# Patient Record
Sex: Female | Born: 1960
Health system: Southern US, Community
[De-identification: ages and names within clinical notes are randomized; demographics above are authoritative.]

## PROBLEM LIST (undated history)

## (undated) DIAGNOSIS — G5603 Carpal tunnel syndrome, bilateral upper limbs: Secondary | ICD-10-CM

## (undated) DIAGNOSIS — K2921 Alcoholic gastritis with bleeding: Secondary | ICD-10-CM

## (undated) DIAGNOSIS — M542 Cervicalgia: Secondary | ICD-10-CM

## (undated) DIAGNOSIS — I10 Essential (primary) hypertension: Secondary | ICD-10-CM

## (undated) DIAGNOSIS — Z973 Presence of spectacles and contact lenses: Secondary | ICD-10-CM

## (undated) DIAGNOSIS — K219 Gastro-esophageal reflux disease without esophagitis: Secondary | ICD-10-CM

## (undated) DIAGNOSIS — E785 Hyperlipidemia, unspecified: Secondary | ICD-10-CM

## (undated) DIAGNOSIS — Z780 Asymptomatic menopausal state: Secondary | ICD-10-CM

## (undated) DIAGNOSIS — M1990 Unspecified osteoarthritis, unspecified site: Secondary | ICD-10-CM

## (undated) DIAGNOSIS — A045 Campylobacter enteritis: Secondary | ICD-10-CM

## (undated) DIAGNOSIS — I219 Acute myocardial infarction, unspecified: Secondary | ICD-10-CM

## (undated) HISTORY — DX: Alcoholic gastritis with bleeding: K29.21

## (undated) HISTORY — DX: Hyperlipidemia, unspecified: E78.5

## (undated) HISTORY — DX: Unspecified osteoarthritis, unspecified site: M19.90

## (undated) HISTORY — DX: Cervicalgia: M54.2

## (undated) HISTORY — DX: Carpal tunnel syndrome, bilateral upper limbs: G56.03

## (undated) HISTORY — DX: Essential (primary) hypertension: I10

## (undated) HISTORY — DX: Campylobacter enteritis: A04.5

## (undated) HISTORY — PX: TONSILLECTOMY: SUR1361

## (undated) HISTORY — DX: Asymptomatic menopausal state: Z78.0

---

## 1993-11-30 HISTORY — PX: CYST EXCISION: SHX5701

## 1998-03-13 ENCOUNTER — Ambulatory Visit (HOSPITAL_COMMUNITY): Admission: RE | Admit: 1998-03-13 | Discharge: 1998-03-13 | Payer: Self-pay | Admitting: Family Medicine

## 1998-07-11 ENCOUNTER — Ambulatory Visit (HOSPITAL_COMMUNITY): Admission: RE | Admit: 1998-07-11 | Discharge: 1998-07-11 | Payer: Self-pay | Admitting: Specialist

## 1998-07-20 ENCOUNTER — Ambulatory Visit (HOSPITAL_COMMUNITY): Admission: RE | Admit: 1998-07-20 | Discharge: 1998-07-20 | Payer: Self-pay | Admitting: Family Medicine

## 1998-07-25 ENCOUNTER — Ambulatory Visit (HOSPITAL_BASED_OUTPATIENT_CLINIC_OR_DEPARTMENT_OTHER): Admission: RE | Admit: 1998-07-25 | Discharge: 1998-07-25 | Payer: Self-pay | Admitting: Specialist

## 1998-12-27 ENCOUNTER — Encounter: Payer: Self-pay | Admitting: Family Medicine

## 1998-12-27 ENCOUNTER — Ambulatory Visit (HOSPITAL_COMMUNITY): Admission: RE | Admit: 1998-12-27 | Discharge: 1998-12-27 | Payer: Self-pay | Admitting: Family Medicine

## 2000-12-06 ENCOUNTER — Emergency Department (HOSPITAL_COMMUNITY): Admission: EM | Admit: 2000-12-06 | Discharge: 2000-12-07 | Payer: Self-pay | Admitting: Emergency Medicine

## 2001-04-19 ENCOUNTER — Ambulatory Visit (HOSPITAL_BASED_OUTPATIENT_CLINIC_OR_DEPARTMENT_OTHER): Admission: RE | Admit: 2001-04-19 | Discharge: 2001-04-19 | Payer: Self-pay | Admitting: Family Medicine

## 2001-06-17 ENCOUNTER — Emergency Department (HOSPITAL_COMMUNITY): Admission: EM | Admit: 2001-06-17 | Discharge: 2001-06-17 | Payer: Self-pay | Admitting: Emergency Medicine

## 2001-06-28 ENCOUNTER — Encounter: Admission: RE | Admit: 2001-06-28 | Discharge: 2001-06-28 | Payer: Self-pay | Admitting: Internal Medicine

## 2001-07-01 ENCOUNTER — Ambulatory Visit (HOSPITAL_COMMUNITY): Admission: RE | Admit: 2001-07-01 | Discharge: 2001-07-01 | Payer: Self-pay | Admitting: Family Medicine

## 2001-07-04 ENCOUNTER — Encounter: Admission: RE | Admit: 2001-07-04 | Discharge: 2001-10-02 | Payer: Self-pay | Admitting: Internal Medicine

## 2001-08-08 ENCOUNTER — Encounter: Admission: RE | Admit: 2001-08-08 | Discharge: 2001-08-08 | Payer: Self-pay | Admitting: Infectious Diseases

## 2001-08-18 ENCOUNTER — Encounter: Admission: RE | Admit: 2001-08-18 | Discharge: 2001-08-29 | Payer: Self-pay | Admitting: Internal Medicine

## 2002-06-27 ENCOUNTER — Encounter: Admission: RE | Admit: 2002-06-27 | Discharge: 2002-06-27 | Payer: Self-pay | Admitting: Internal Medicine

## 2002-08-11 ENCOUNTER — Encounter: Admission: RE | Admit: 2002-08-11 | Discharge: 2002-08-11 | Payer: Self-pay | Admitting: Internal Medicine

## 2002-11-27 ENCOUNTER — Emergency Department (HOSPITAL_COMMUNITY): Admission: EM | Admit: 2002-11-27 | Discharge: 2002-11-28 | Payer: Self-pay

## 2002-11-28 ENCOUNTER — Encounter: Payer: Self-pay | Admitting: Emergency Medicine

## 2002-11-30 HISTORY — PX: OTHER SURGICAL HISTORY: SHX169

## 2002-12-06 ENCOUNTER — Encounter: Admission: RE | Admit: 2002-12-06 | Discharge: 2002-12-06 | Payer: Self-pay | Admitting: Internal Medicine

## 2003-03-29 ENCOUNTER — Emergency Department (HOSPITAL_COMMUNITY): Admission: EM | Admit: 2003-03-29 | Discharge: 2003-03-29 | Payer: Self-pay

## 2003-03-31 ENCOUNTER — Emergency Department (HOSPITAL_COMMUNITY): Admission: EM | Admit: 2003-03-31 | Discharge: 2003-03-31 | Payer: Self-pay | Admitting: Emergency Medicine

## 2003-04-20 ENCOUNTER — Encounter: Admission: RE | Admit: 2003-04-20 | Discharge: 2003-04-20 | Payer: Self-pay | Admitting: Internal Medicine

## 2003-04-27 ENCOUNTER — Encounter: Payer: Self-pay | Admitting: Internal Medicine

## 2003-04-27 ENCOUNTER — Ambulatory Visit (HOSPITAL_COMMUNITY): Admission: RE | Admit: 2003-04-27 | Discharge: 2003-04-27 | Payer: Self-pay | Admitting: Internal Medicine

## 2003-05-21 ENCOUNTER — Encounter: Admission: RE | Admit: 2003-05-21 | Discharge: 2003-05-21 | Payer: Self-pay | Admitting: Internal Medicine

## 2003-05-22 ENCOUNTER — Encounter: Admission: RE | Admit: 2003-05-22 | Discharge: 2003-05-22 | Payer: Self-pay | Admitting: Internal Medicine

## 2003-05-24 ENCOUNTER — Encounter: Admission: RE | Admit: 2003-05-24 | Discharge: 2003-05-24 | Payer: Self-pay | Admitting: Internal Medicine

## 2003-08-09 ENCOUNTER — Encounter (INDEPENDENT_AMBULATORY_CARE_PROVIDER_SITE_OTHER): Payer: Self-pay | Admitting: Internal Medicine

## 2003-08-09 LAB — CONVERTED CEMR LAB: Pap Smear: NORMAL

## 2003-08-10 ENCOUNTER — Encounter: Admission: RE | Admit: 2003-08-10 | Discharge: 2003-08-10 | Payer: Self-pay | Admitting: Internal Medicine

## 2003-08-23 ENCOUNTER — Encounter: Admission: RE | Admit: 2003-08-23 | Discharge: 2003-08-23 | Payer: Self-pay | Admitting: Obstetrics and Gynecology

## 2003-08-23 ENCOUNTER — Encounter (INDEPENDENT_AMBULATORY_CARE_PROVIDER_SITE_OTHER): Payer: Self-pay | Admitting: *Deleted

## 2003-08-23 ENCOUNTER — Other Ambulatory Visit: Admission: RE | Admit: 2003-08-23 | Discharge: 2003-08-23 | Payer: Self-pay | Admitting: Obstetrics and Gynecology

## 2003-08-29 ENCOUNTER — Ambulatory Visit (HOSPITAL_COMMUNITY): Admission: RE | Admit: 2003-08-29 | Discharge: 2003-08-29 | Payer: Self-pay | Admitting: Obstetrics and Gynecology

## 2003-08-29 ENCOUNTER — Encounter: Payer: Self-pay | Admitting: Obstetrics and Gynecology

## 2004-02-20 ENCOUNTER — Encounter: Admission: RE | Admit: 2004-02-20 | Discharge: 2004-02-20 | Payer: Self-pay | Admitting: Internal Medicine

## 2004-03-04 ENCOUNTER — Encounter: Admission: RE | Admit: 2004-03-04 | Discharge: 2004-03-04 | Payer: Self-pay | Admitting: Internal Medicine

## 2004-03-05 ENCOUNTER — Encounter: Admission: RE | Admit: 2004-03-05 | Discharge: 2004-03-05 | Payer: Self-pay | Admitting: Internal Medicine

## 2004-04-03 ENCOUNTER — Encounter: Admission: RE | Admit: 2004-04-03 | Discharge: 2004-04-03 | Payer: Self-pay | Admitting: Internal Medicine

## 2004-07-14 ENCOUNTER — Encounter: Admission: RE | Admit: 2004-07-14 | Discharge: 2004-07-14 | Payer: Self-pay | Admitting: Internal Medicine

## 2004-08-25 ENCOUNTER — Ambulatory Visit: Payer: Self-pay | Admitting: Internal Medicine

## 2004-11-10 ENCOUNTER — Ambulatory Visit: Payer: Self-pay | Admitting: Internal Medicine

## 2004-11-20 ENCOUNTER — Ambulatory Visit (HOSPITAL_COMMUNITY): Admission: RE | Admit: 2004-11-20 | Discharge: 2004-11-20 | Payer: Self-pay | Admitting: Internal Medicine

## 2005-03-23 ENCOUNTER — Ambulatory Visit: Payer: Self-pay | Admitting: Internal Medicine

## 2005-04-29 ENCOUNTER — Ambulatory Visit: Payer: Self-pay | Admitting: Internal Medicine

## 2005-06-01 ENCOUNTER — Ambulatory Visit: Payer: Self-pay | Admitting: Internal Medicine

## 2005-06-08 ENCOUNTER — Ambulatory Visit: Payer: Self-pay | Admitting: Internal Medicine

## 2005-06-08 ENCOUNTER — Inpatient Hospital Stay (HOSPITAL_COMMUNITY): Admission: AD | Admit: 2005-06-08 | Discharge: 2005-06-10 | Payer: Self-pay | Admitting: Internal Medicine

## 2005-06-18 ENCOUNTER — Ambulatory Visit: Payer: Self-pay | Admitting: Internal Medicine

## 2005-09-02 ENCOUNTER — Ambulatory Visit (HOSPITAL_COMMUNITY): Admission: RE | Admit: 2005-09-02 | Discharge: 2005-09-02 | Payer: Self-pay | Admitting: Internal Medicine

## 2005-09-29 ENCOUNTER — Ambulatory Visit: Payer: Self-pay | Admitting: Internal Medicine

## 2005-10-06 ENCOUNTER — Ambulatory Visit: Payer: Self-pay | Admitting: Internal Medicine

## 2005-12-23 ENCOUNTER — Ambulatory Visit: Payer: Self-pay | Admitting: Internal Medicine

## 2006-02-23 ENCOUNTER — Ambulatory Visit: Payer: Self-pay | Admitting: Internal Medicine

## 2006-05-11 ENCOUNTER — Ambulatory Visit: Payer: Self-pay | Admitting: Internal Medicine

## 2006-05-11 ENCOUNTER — Ambulatory Visit (HOSPITAL_COMMUNITY): Admission: RE | Admit: 2006-05-11 | Discharge: 2006-05-11 | Payer: Self-pay | Admitting: Internal Medicine

## 2006-09-03 ENCOUNTER — Ambulatory Visit (HOSPITAL_COMMUNITY): Admission: RE | Admit: 2006-09-03 | Discharge: 2006-09-03 | Payer: Self-pay | Admitting: Internal Medicine

## 2006-09-06 ENCOUNTER — Ambulatory Visit (HOSPITAL_COMMUNITY): Admission: RE | Admit: 2006-09-06 | Discharge: 2006-09-06 | Payer: Self-pay | Admitting: Internal Medicine

## 2006-09-06 ENCOUNTER — Ambulatory Visit: Payer: Self-pay | Admitting: *Deleted

## 2006-09-06 ENCOUNTER — Ambulatory Visit: Payer: Self-pay | Admitting: Internal Medicine

## 2006-11-15 ENCOUNTER — Ambulatory Visit: Payer: Self-pay | Admitting: Internal Medicine

## 2006-11-15 ENCOUNTER — Ambulatory Visit (HOSPITAL_COMMUNITY): Admission: RE | Admit: 2006-11-15 | Discharge: 2006-11-15 | Payer: Self-pay | Admitting: Internal Medicine

## 2006-11-16 ENCOUNTER — Encounter (INDEPENDENT_AMBULATORY_CARE_PROVIDER_SITE_OTHER): Payer: Self-pay | Admitting: Internal Medicine

## 2006-11-16 DIAGNOSIS — E785 Hyperlipidemia, unspecified: Secondary | ICD-10-CM | POA: Insufficient documentation

## 2006-11-16 DIAGNOSIS — F1010 Alcohol abuse, uncomplicated: Secondary | ICD-10-CM | POA: Insufficient documentation

## 2006-11-16 DIAGNOSIS — D259 Leiomyoma of uterus, unspecified: Secondary | ICD-10-CM | POA: Insufficient documentation

## 2006-11-16 DIAGNOSIS — N61 Mastitis without abscess: Secondary | ICD-10-CM

## 2006-11-16 DIAGNOSIS — I10 Essential (primary) hypertension: Secondary | ICD-10-CM | POA: Insufficient documentation

## 2006-11-16 DIAGNOSIS — E1169 Type 2 diabetes mellitus with other specified complication: Secondary | ICD-10-CM

## 2006-11-17 DIAGNOSIS — M545 Low back pain, unspecified: Secondary | ICD-10-CM | POA: Insufficient documentation

## 2006-11-17 DIAGNOSIS — K219 Gastro-esophageal reflux disease without esophagitis: Secondary | ICD-10-CM | POA: Insufficient documentation

## 2006-11-17 DIAGNOSIS — N840 Polyp of corpus uteri: Secondary | ICD-10-CM | POA: Insufficient documentation

## 2006-12-06 ENCOUNTER — Ambulatory Visit: Payer: Self-pay | Admitting: Internal Medicine

## 2007-03-07 ENCOUNTER — Ambulatory Visit: Payer: Self-pay | Admitting: Internal Medicine

## 2007-03-07 LAB — CONVERTED CEMR LAB
Cholesterol: 154 mg/dL (ref 0–200)
HDL: 42 mg/dL (ref >39)
Total CHOL/HDL Ratio: 3.7
Triglycerides: 76 mg/dL (ref <150)
VLDL: 15 mg/dL (ref 0–40)

## 2007-03-09 ENCOUNTER — Ambulatory Visit: Payer: Self-pay | Admitting: Internal Medicine

## 2007-04-11 ENCOUNTER — Ambulatory Visit: Payer: Self-pay | Admitting: Internal Medicine

## 2007-04-11 DIAGNOSIS — L732 Hidradenitis suppurativa: Secondary | ICD-10-CM

## 2007-04-11 HISTORY — DX: Hidradenitis suppurativa: L73.2

## 2007-04-11 LAB — CONVERTED CEMR LAB: Blood Glucose, Fingerstick: 207

## 2007-04-29 ENCOUNTER — Emergency Department (HOSPITAL_COMMUNITY): Admission: EM | Admit: 2007-04-29 | Discharge: 2007-04-29 | Payer: Self-pay | Admitting: Emergency Medicine

## 2007-05-19 ENCOUNTER — Encounter (INDEPENDENT_AMBULATORY_CARE_PROVIDER_SITE_OTHER): Payer: Self-pay | Admitting: Internal Medicine

## 2007-06-07 ENCOUNTER — Telehealth: Payer: Self-pay | Admitting: *Deleted

## 2007-07-07 ENCOUNTER — Ambulatory Visit: Payer: Self-pay | Admitting: Internal Medicine

## 2007-10-04 ENCOUNTER — Ambulatory Visit: Payer: Self-pay | Admitting: Internal Medicine

## 2007-12-26 ENCOUNTER — Telehealth (INDEPENDENT_AMBULATORY_CARE_PROVIDER_SITE_OTHER): Payer: Self-pay | Admitting: *Deleted

## 2008-05-08 ENCOUNTER — Emergency Department (HOSPITAL_COMMUNITY): Admission: EM | Admit: 2008-05-08 | Discharge: 2008-05-08 | Payer: Self-pay | Admitting: Emergency Medicine

## 2008-08-07 ENCOUNTER — Ambulatory Visit: Payer: Self-pay | Admitting: Internal Medicine

## 2008-11-21 ENCOUNTER — Ambulatory Visit: Payer: Self-pay | Admitting: Internal Medicine

## 2008-11-30 DIAGNOSIS — K2921 Alcoholic gastritis with bleeding: Secondary | ICD-10-CM

## 2008-11-30 HISTORY — DX: Alcoholic gastritis with bleeding: K29.21

## 2008-11-30 HISTORY — PX: FOOT OSTEOTOMY: SHX957

## 2009-01-17 LAB — HM PAP SMEAR: HM Pap smear: NORMAL

## 2009-06-25 ENCOUNTER — Inpatient Hospital Stay: Payer: Self-pay | Admitting: Internal Medicine

## 2010-04-30 LAB — HM COLONOSCOPY: HM Colonoscopy: NORMAL

## 2010-05-07 ENCOUNTER — Ambulatory Visit: Payer: Self-pay | Admitting: Gastroenterology

## 2010-07-30 ENCOUNTER — Ambulatory Visit: Payer: Self-pay | Admitting: Internal Medicine

## 2010-07-31 LAB — HM MAMMOGRAPHY: HM Mammogram: NORMAL

## 2010-08-12 ENCOUNTER — Ambulatory Visit: Payer: Self-pay | Admitting: Internal Medicine

## 2010-12-31 ENCOUNTER — Ambulatory Visit: Payer: Self-pay | Admitting: Internal Medicine

## 2011-03-31 ENCOUNTER — Inpatient Hospital Stay: Payer: Self-pay | Admitting: Internal Medicine

## 2011-03-31 DIAGNOSIS — A045 Campylobacter enteritis: Secondary | ICD-10-CM

## 2011-03-31 HISTORY — DX: Campylobacter enteritis: A04.5

## 2011-04-15 ENCOUNTER — Ambulatory Visit: Payer: Self-pay | Admitting: Internal Medicine

## 2011-04-17 NOTE — Discharge Summary (Signed)
NAMEJAINA, [name]                ACCOUNT NO.:  [id number]   MEDICAL RECORD NO.:  [id number]          PATIENT TYPE:  INP   LOCATION:  6708                         FACILITY:  [facility]   PHYSICIAN:  Alvester [name], M.D.  DATE OF BIRTH:  02-15-1961   DATE OF ADMISSION:  06/08/2005  DATE OF DISCHARGE:  06/10/2005                                 DISCHARGE SUMMARY   DISCHARGE DIAGNOSES:  1.  Subcutaneous abscess and cellulitis on right inner thigh.  2.  Type 2 diabetes mellitus.  3.  Hypertension.  4.  Dyslipidemia.  5.  Dyspepsia.  6.  History of uterine fibroids.   DISCHARGE MEDICATIONS:  1.  Cephalexin 500 mg, two capsules b.i.d. for 14 days.  2.  Enalapril 5 mg, one p.o. daily.  3.  Glipizide 10 mg, one p.o. b.i.d.  4.  Glucophage 1000 mg, one p.o. b.i.d.  5.  HCTZ 25 mg, one p.o. daily.  6.  Protonix 40 mg, one p.o. daily.  7.  Lipitor 10 mg, one p.o. daily.   PROCEDURES:  No procedures were performed while the patient was admitted,  although an I&D of her right thigh abscess was performed in clinic on the  day of admission.  Please see clinic notes for details of that procedure.   CONSULTATIONS:  None.   ADMISSION HISTORY AND PHYSICAL:  This was an 50 year old African American  female who was admitted directly from clinic because of bilateral thigh  abscesses.  The right inner thigh abscess was remarkable for erythema, and  the clinic physician was concerned about possible spread and blood  contamination due to the patient's uncontrolled diabetes.  The patient had  no symptoms of infection.  The abscesses were not painful to her, but she  did note that they had been there for approximately three weeks.  She does  have a history of similar boils in her inguinal area as well as under her  arms which she had to have surgery for in 1999.  The patient states that  when she gets these boils they usually resolve with warm compresses only and  has never had to get them I&D'd  before.  She has had no fevers or chills.  She has had some mild nausea and vomiting over the past two to three weeks,  with about two events of vomiting and two events of diarrhea.   In clinic on the day of discharge, the right inner thigh abscess was I&D'd,  with expression of pus and some blood.  Wound cultures x2 were sent to the  lab, and the patient was admitted for IV antibiotics.   PHYSICAL EXAMINATION:  VITAL SIGNS:  Pulse 94, blood pressure 120/84,  temperature 97.1, respirations 20, oxygen saturation 99% on room air.  Blood  glucose 207.  GENERAL:  The patient was in no acute distress, alert and oriented x3, in no  acute pain.  SKIN:  Exam was remarkable on the right inner thigh for a 4 x 3 nodular area  with induration and fluctuance and erythema extending to approximately 20 cm  along the  medial thigh.  There was gauze and a bandage over the I&D site  that had a small amount of blood and some pus on hit.  On the left inner  thigh, there were some smaller pustules but no surrounding erythema, about 3-  5 in number, and they were not draining.  LYMPHATICS:  There was no inguinal lymphadenopathy and no lymphadenopathy in  the axilla.  The exam was otherwise unremarkable.   ADMISSION LABORATORY DATA:  Sodium 135, potassium 3.5, chloride 102, bicarb  24, BUN 10, creatinine 0.6, blood glucose 252.  White blood cells 8.6,  hemoglobin 12.6, platelets 342, with an ANC of 5.4.   HOSPITAL COURSE:  Problem 1.  Right thigh abscess and cellulitis.  As  mentioned previously, the abscess was I&D'd in clinic, with expression of  some pus, but there was concern for spread of the infection due to the large  area of erythema and the patient's very poorly controlled diabetes.  The  patient's clinic physician, Dr. Darrick Huntsman, decided that IV antibiotics would be  helpful.  We admitted the patient and began her on vancomycin to cover  possible MRSA and also Zosyn to cover gram-negative organisms as  well as  anaerobes.  Wound cultures x2 were sent from the clinic, and blood cultures  were taken prior to antibiotics when the patient was admitted.  Over the  course of the patient's admission, the abscess area had a remarkable  decrease in the surrounding erythema and cellulitis as well as a decrease in  the hard nodular area that was I&D'd.  The patient had no fever and no  increase in white blood cells while she was admitted and no other signs or  symptoms of systemic infection.  The wound cultures eventually grew group B  strep, and the blood cultures were no growth at the time of this dictation.  Since group B strep was the only organism isolated from the wound, it was  decided to discontinue IV antibiotics and just to discharge the patient home  on cephalexin 500 mg, two capsules p.o. b.i.d. for 14 days.   Problem 2.  Diabetes mellitus, type 2.  The patient's blood sugars were  poorly controlled prior to admission.  The patient states that she had lost  her glucometer and could not check her blood sugars at home, but she could  tell that her blood sugars were high because of vaginal itching, changes in  her vision, and some headache.  Throughout the course of her stay, her blood  sugars were in the upper 100s to lower 200s, with an isolated sugar of 361.  She was continued during her admission on her home medications of glipizide  and Glucophage since she had questionable compliance prior to admission.  She was also covered by a sliding scale insulin with NovoLog.  She also  received diabetic counseling.  Her hemoglobin A1C during the admission was  11.  Upon discharge, she was again started on her home medications of  glipizide and Glucophage, even though her blood sugars were not well-  controlled during her admission.  She had questionable compliance prior to  coming into the hospital, so we would like to try her on her home regimen and see how her blood glucose responds.  The  patient was asked if she would  like to go on a nighttime dose of Lantus insulin, but the patient was not  interested in self-administering insulin injections.  The patient expressed  understanding of the  need for her to be an active participant in controlling  her diabetes and expressed awareness of the possible complications of  uncontrolled diabetes.  The patient seemed appropriately concerned about her  diabetes and expressed her desires to become compliant with her diabetes  regimen.  She was given a coupon for a free glucometer, so she should pick  this up upon discharge and be able to check her blood sugars regularly.   DISCHARGE LABORATORY DATA:  Sodium 134, potassium 3.5, chloride 102, bicarb  23, BUN 7, creatinine 0.5, glucose 247.  White blood cells 7.1, hemoglobin  11.9, platelets 331.  Liver function tests were all within normal limits.   FOLLOW UP:  The patient will go to see Dr. Darrick Huntsman in the [facility] clinic on  06/18/2005 at 2:15.       EA/MEDQ  D:  06/11/2005  T:  06/11/2005  Job:  [id number]   cc:   Duncan Dull, M.D.  Fax: [phone]

## 2011-04-17 NOTE — Group Therapy Note (Signed)
   NAME:  DULA, HAVLIK NO.:  [id number]   MEDICAL RECORD NO.:  [id number]                   PATIENT TYPE:  OUT   LOCATION:  [facility]                           FACILITY:  [facility]   PHYSICIAN:  Argentina Donovan, MD                     DATE OF BIRTH:  1961-05-13   DATE OF SERVICE:  08/23/2003                                    CLINIC NOTE   REASON FOR VISIT:  The patient is a 50 year old black female who has been on  Depo-Provera for many years.  She is a poorly controlled diabetic mellitus  patient who is being seen by the medical clinic and is on oral medications.  She is also taking Lipitor for dyslipidemia.  She comes in the GYN clinic  after being sent by the medical clinic because of complaints of having had  uterine spotting and bleeding for two months.  Also, she complains of some  significant itching around the clitoral area.   PHYSICAL EXAMINATION:  PELVIC:  On examination, external genitalia is  normal.  BUS within normal limits.  The vagina is clean with a dark burgundy  discharge from the cervix, stringy in nature.  Cervix is clean and parous.  The uterus seems to be a normal size, shape, and consistency and sounds to 7  cm.  Endometrial biopsy has been taken.  The clitoral area is examined,  seems to be dry, pale pink with small paper cuts probably secondary to  yeast.  The patient is also complaining of recurrent furuncles on the  perineum and vulva area.   PLAN:  My plan is to give her Mycolog-II to use around the clitoral area,  then to wait for the results of the endometrial biopsy and to do a pelvic  sonogram with our future treatment pending depending on those results, and  to put her on long-term, at least six months, of doxycycline b.i.d. in a  hope to resolve and to reduce the incidence of furunculosis.                                               Argentina Donovan, MD    PR/MEDQ  D:  08/23/2003  T:  08/23/2003  Job:  [id number]

## 2011-08-01 ENCOUNTER — Other Ambulatory Visit: Payer: Self-pay | Admitting: Internal Medicine

## 2011-08-04 ENCOUNTER — Encounter: Payer: Self-pay | Admitting: Internal Medicine

## 2011-08-04 ENCOUNTER — Ambulatory Visit (INDEPENDENT_AMBULATORY_CARE_PROVIDER_SITE_OTHER): Payer: BC Managed Care – PPO | Admitting: Internal Medicine

## 2011-08-04 DIAGNOSIS — Z1239 Encounter for other screening for malignant neoplasm of breast: Secondary | ICD-10-CM | POA: Insufficient documentation

## 2011-08-04 DIAGNOSIS — M5431 Sciatica, right side: Secondary | ICD-10-CM

## 2011-08-04 DIAGNOSIS — E1165 Type 2 diabetes mellitus with hyperglycemia: Secondary | ICD-10-CM

## 2011-08-04 DIAGNOSIS — Z1211 Encounter for screening for malignant neoplasm of colon: Secondary | ICD-10-CM

## 2011-08-04 DIAGNOSIS — K219 Gastro-esophageal reflux disease without esophagitis: Secondary | ICD-10-CM

## 2011-08-04 DIAGNOSIS — M545 Low back pain: Secondary | ICD-10-CM

## 2011-08-04 DIAGNOSIS — E785 Hyperlipidemia, unspecified: Secondary | ICD-10-CM

## 2011-08-04 DIAGNOSIS — M5430 Sciatica, unspecified side: Secondary | ICD-10-CM

## 2011-08-04 DIAGNOSIS — E119 Type 2 diabetes mellitus without complications: Secondary | ICD-10-CM

## 2011-08-04 MED ORDER — HYDROCODONE-ACETAMINOPHEN 7.5-500 MG PO TABS: 1.0000 | ORAL_TABLET | Freq: Every evening | ORAL | Status: AC | PRN

## 2011-08-04 MED ORDER — INSULIN GLARGINE 100 UNIT/ML ~~LOC~~ SOLN: 30.0000 [IU] | Freq: Every day | SUBCUTANEOUS | Status: DC

## 2011-08-04 MED ORDER — GLIPIZIDE 10 MG PO TABS: 10.0000 mg | ORAL_TABLET | Freq: Two times a day (BID) | ORAL | Status: DC

## 2011-08-04 MED ORDER — ESOMEPRAZOLE MAGNESIUM 40 MG PO CPDR: 40.0000 mg | DELAYED_RELEASE_CAPSULE | Freq: Every day | ORAL | Status: DC

## 2011-08-04 MED ORDER — METFORMIN HCL 1000 MG PO TABS: 1000.0000 mg | ORAL_TABLET | Freq: Two times a day (BID) | ORAL | Status: DC

## 2011-08-04 MED ORDER — TRAMADOL HCL 50 MG PO TABS: 50.0000 mg | ORAL_TABLET | Freq: Four times a day (QID) | ORAL | Status: AC | PRN

## 2011-08-04 NOTE — Assessment & Plan Note (Addendum)
With prior episodes of gastritis due to alcohol use  .  currently asymptomatic on Nexium.  Samples and coupon given.

## 2011-08-04 NOTE — Assessment & Plan Note (Addendum)
Last Aic was 7.01 March 2011  But she was started on 70/30 insulin 20 units with dinner for elevated cbgs in may despiite use of  Januvia, metformin and glipizide. UA was normal  No proteinuria.  She has allowed all of her mediations to lapse for unclear reasons and is requesting saples of Januvia, which we do not have.  Will start Lantus Solostar with 30 day free voucher at 30 units daily , resuem glipizde and metformin.

## 2011-08-04 NOTE — Progress Notes (Signed)
  Subjective:    Patient ID: [name], female    DOB: 25-Dec-1960, 50 y.o.   MRN: [id number]  HPIpresents for diabetes followup.  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  Uses a pillow under knees at night helps.    Past Medical History  Diagnosis Date  . Hyperlipidemia   . Diabetes mellitus   . Gastritis with bleeding due to alcohol 2010    [facility] admission  . Campylobacter enteritis May 2012    [facility] admission   Current Outpatient Prescriptions on File Prior to Visit  Medication Sig Dispense Refill  . insulin glargine (LANTUS SOLOSTAR) 100 UNIT/ML injection Inject 30 Units into the skin at bedtime.  10 mL  12     Review of Systems  Constitutional: Negative for fever, chills and unexpected weight change.  HENT: Negative for hearing loss, ear pain, nosebleeds, congestion, sore throat, facial swelling, rhinorrhea, sneezing, mouth sores, trouble swallowing, neck pain, neck stiffness, voice change, postnasal drip, sinus pressure, tinnitus and ear discharge.   Eyes: Negative for pain, discharge, redness and visual disturbance.  Respiratory: Negative for cough, chest tightness, shortness of breath, wheezing and stridor.   Cardiovascular: Negative for chest pain, palpitations and leg swelling.  Musculoskeletal: Positive for back pain. Negative for myalgias and arthralgias.  Skin: Negative for color change and rash.  Neurological: Negative for dizziness, weakness, light-headedness and headaches.  Hematological: Negative for adenopathy.   BP 126/85  Pulse 85  Temp(Src) 98.2 F (36.8 C) (Oral)  Resp 16  Ht 5\' 3"  (1.6 m)  Wt 179 lb 4 oz (81.307 kg)  BMI 31.75 kg/m2  SpO2 99%     Objective:   Physical Exam  Constitutional: She is oriented to person, place, and time. She appears well-developed and well-nourished.    HENT:  Mouth/Throat: Oropharynx is clear and moist.  Eyes: EOM are normal. Pupils are equal, round, and reactive to light. No scleral icterus.  Neck: Normal range of motion. Neck supple. No JVD present. No thyromegaly present.  Cardiovascular: Normal rate, regular rhythm, normal heart sounds and intact distal pulses.   Pulmonary/Chest: Effort normal and breath sounds normal.  Abdominal: Soft. Bowel sounds are normal. She exhibits no mass. There is no tenderness.  Musculoskeletal: Normal range of motion. She exhibits no edema.  Lymphadenopathy:    She has no cervical adenopathy.  Neurological: She is alert and oriented to person, place, and time. She has normal reflexes.  Skin: Skin is warm and dry.  Psychiatric: She has a normal mood and affect.          Assessment & Plan:

## 2011-08-04 NOTE — Patient Instructions (Addendum)
Check with your insurance booklet about the price of Januvia and Lantus .  (the one with the lower Tier is going to be less expensive)  We will be setting you up for lumbar imaging or nerve conduction studies with a neurologist to determine the source of your leg pain .  Return next week for fasting lipids.   Sing a medical release form today so I can get your old records.

## 2011-08-05 NOTE — Assessment & Plan Note (Signed)
She is due for mammogram. 

## 2011-08-05 NOTE — Assessment & Plan Note (Signed)
Has been out of statin for a week,.  Will resume and check labs in 2 weeks,  Goal is < 70.

## 2011-08-05 NOTE — Assessment & Plan Note (Signed)
With sciatica described by patient.  Will review prior workup and proceed with MRI vs EMG/Ashley studies to determine cause.

## 2011-08-10 ENCOUNTER — Other Ambulatory Visit: Payer: Self-pay | Admitting: Internal Medicine

## 2011-08-10 DIAGNOSIS — E785 Hyperlipidemia, unspecified: Secondary | ICD-10-CM

## 2011-08-11 ENCOUNTER — Other Ambulatory Visit (INDEPENDENT_AMBULATORY_CARE_PROVIDER_SITE_OTHER): Payer: BC Managed Care – PPO | Admitting: *Deleted

## 2011-08-11 DIAGNOSIS — E119 Type 2 diabetes mellitus without complications: Secondary | ICD-10-CM

## 2011-08-11 DIAGNOSIS — E785 Hyperlipidemia, unspecified: Secondary | ICD-10-CM

## 2011-08-11 LAB — HEMOGLOBIN A1C: Hgb A1c MFr Bld: 9.5 % — ABNORMAL HIGH (ref 4.6–6.5)

## 2011-08-12 LAB — HEPATIC FUNCTION PANEL
ALT: 19 U/L (ref 0–35)
Total Bilirubin: 0.5 mg/dL (ref 0.3–1.2)
Total Protein: 7.3 g/dL (ref 6.0–8.3)

## 2011-08-12 LAB — BASIC METABOLIC PANEL
CO2: 19 mEq/L (ref 19–32)
Calcium: 9.1 mg/dL (ref 8.4–10.5)
Creatinine, Ser: 0.7 mg/dL (ref 0.4–1.2)
GFR: 119.07 mL/min (ref >60.00)
Glucose, Bld: 180 mg/dL — ABNORMAL HIGH (ref 70–99)
Sodium: 140 mEq/L (ref 135–145)

## 2011-08-12 LAB — LIPID PANEL
HDL: 40 mg/dL (ref >39.00)
Total CHOL/HDL Ratio: 4

## 2011-08-14 ENCOUNTER — Encounter: Payer: Self-pay | Admitting: Internal Medicine

## 2011-08-18 ENCOUNTER — Telehealth: Payer: Self-pay | Admitting: Internal Medicine

## 2011-08-18 NOTE — Telephone Encounter (Signed)
Patient called and stated she has had a knot under her breast for the past two weeks.  She thought at first if was coming from her bra but her stated it is still there and will not go away.  She wanted to know if she needed to be seen for this.  Please advise

## 2011-08-19 ENCOUNTER — Encounter: Payer: Self-pay | Admitting: Internal Medicine

## 2011-08-19 MED ORDER — SULFAMETHOXAZOLE-TRIMETHOPRIM 800-160 MG PO TABS: 1.0000 | ORAL_TABLET | Freq: Two times a day (BID) | ORAL | Status: AC

## 2011-08-19 NOTE — Telephone Encounter (Signed)
Rx sent to pharmacy. Patient notified. 

## 2011-08-19 NOTE — Telephone Encounter (Signed)
Transferred to [name] to schedule appt.

## 2011-08-19 NOTE — Telephone Encounter (Signed)
Yes, she should be seen,  Put her in for 1:30 on Thursday and call her  in Septra DS one tablet bid x 7 days  #14 IF she says it is red and tender

## 2011-08-20 ENCOUNTER — Ambulatory Visit (INDEPENDENT_AMBULATORY_CARE_PROVIDER_SITE_OTHER): Payer: BC Managed Care – PPO | Admitting: Internal Medicine

## 2011-08-20 ENCOUNTER — Encounter: Payer: Self-pay | Admitting: Internal Medicine

## 2011-08-20 VITALS — BP 111/74 | HR 64 | Temp 97.6°F | Resp 14 | Wt 180.0 lb

## 2011-08-20 DIAGNOSIS — L02229 Furuncle of trunk, unspecified: Secondary | ICD-10-CM

## 2011-08-20 DIAGNOSIS — M25579 Pain in unspecified ankle and joints of unspecified foot: Secondary | ICD-10-CM

## 2011-08-20 DIAGNOSIS — R110 Nausea: Secondary | ICD-10-CM | POA: Insufficient documentation

## 2011-08-20 DIAGNOSIS — L732 Hidradenitis suppurativa: Secondary | ICD-10-CM

## 2011-08-20 DIAGNOSIS — E119 Type 2 diabetes mellitus without complications: Secondary | ICD-10-CM

## 2011-08-20 NOTE — Assessment & Plan Note (Signed)
She has a history of alcohol induced gastritis but has been abstinent from alcohol and NSAIDs and contineus to use protonix daily,  So her symptoms may be from the metformin.  Instructed her to stop it for a week .  If pain resolved,  Will resume metformin to rule out coincidence .

## 2011-08-20 NOTE — Assessment & Plan Note (Addendum)
improved with resuming Lantus.  Will dc januvia , dc metformin temporarily to see if nausea resolves.  Stop glipizide if hypoglycemia occurs  The dramatic improvement .  Will repeat A1c in early December.

## 2011-08-20 NOTE — Patient Instructions (Addendum)
Take the Septra for 7 days for the skin infection.  Stop the metformin for a week to see if the nausea improves.  If it does, resume the metformin to be sure it was not a coincidence.  Your blood sugars are much better since you resumed the lantus.    If you start having low blood sugars ( < 80) stop the glipizide first.

## 2011-08-20 NOTE — Assessment & Plan Note (Signed)
Septra was prescribed yesterday by phone for presumed falre. The nodule under her breast does have the appearance of an early boil.  If no improvement after empiric Septra, return.

## 2011-08-20 NOTE — Progress Notes (Signed)
  Subjective:    Patient ID: [name], female    DOB: [birth year], 50 y.o.   MRN: [id number]  HPI 74 yon AA feamle with history of diabetes mellitus,  Recurrent skin infections scheduled urgently for evaluation of red tender nodule under left breast. Patient states that it has been present for at least a week and is improving in size. Occurred at bra line. Once in the room she has mutliple other cojmplaints including frequent hot flashes, recurrent nausea, and right leg pain which seems to start in the ankle and radiate upward to knee and hip .  Her last menstrual period was very light and occurred last week, but she takes DepoProvera shots.  Her blood sugars have improved since her last visit when we resumed Lantus . Her fastings have been generally < 120 and her post prandials  Have been 90 to 130.  She has not been able to resume [place] secondary to cost but continues to take glipizide and metformin with no recent lows.    Review of Systems  Constitutional: Negative for fever, chills and unexpected weight change.  HENT: Negative for hearing loss, ear pain, nosebleeds, congestion, sore throat, facial swelling, rhinorrhea, sneezing, mouth sores, trouble swallowing, neck pain, neck stiffness, voice change, postnasal drip, sinus pressure, tinnitus and ear discharge.   Eyes: Negative for pain, discharge, redness and visual disturbance.  Respiratory: Negative for cough, chest tightness, shortness of breath, wheezing and stridor.   Cardiovascular: Negative for chest pain, palpitations and leg swelling.  Musculoskeletal: Positive for back pain. Negative for myalgias and arthralgias.  Skin: Negative for color change and rash.  Neurological: Negative for dizziness, weakness, light-headedness and headaches.  Hematological: Negative for adenopathy.       Objective:   Physical Exam  Constitutional: She is oriented to person, place, and time. She appears well-developed and well-nourished.  HENT:    Mouth/Throat: Oropharynx is clear and moist.  Eyes: EOM are normal. Pupils are equal, round, and reactive to light. No scleral icterus.  Neck: Normal range of motion. Neck supple. No JVD present. No thyromegaly present.  Cardiovascular: Normal rate, regular rhythm, normal heart sounds and intact distal pulses.   Pulmonary/Chest: Effort normal and breath sounds normal.  Abdominal: Soft. Bowel sounds are normal. She exhibits no shifting dullness, no abdominal bruit and no mass. There is no tenderness.  Musculoskeletal: Normal range of motion. She exhibits no edema.  Lymphadenopathy:    She has no cervical adenopathy.  Neurological: She is alert and oriented to person, place, and time. She has normal reflexes.  Skin: Skin is warm and dry. Rash noted.     Psychiatric: She has a normal mood and affect.          Assessment & Plan:

## 2011-08-20 NOTE — Assessment & Plan Note (Signed)
She has chronic pain complaints and priro films of lumbar spine and knees have been normal.  Her source of pain appears to be the base of the achilles tendon. Will refer back to Podiatry to evaluate her ankle pain as this may be source of her leg pain.

## 2011-10-16 ENCOUNTER — Other Ambulatory Visit: Payer: Self-pay | Admitting: Internal Medicine

## 2011-10-16 DIAGNOSIS — M5430 Sciatica, unspecified side: Secondary | ICD-10-CM

## 2011-10-18 NOTE — Telephone Encounter (Signed)
30 day supply authorized ,  No refills

## 2011-11-03 ENCOUNTER — Encounter: Payer: Self-pay | Admitting: Internal Medicine

## 2011-11-03 ENCOUNTER — Ambulatory Visit (INDEPENDENT_AMBULATORY_CARE_PROVIDER_SITE_OTHER): Payer: BC Managed Care – PPO | Admitting: Internal Medicine

## 2011-11-03 DIAGNOSIS — M129 Arthropathy, unspecified: Secondary | ICD-10-CM

## 2011-11-03 DIAGNOSIS — M130 Polyarthritis, unspecified: Secondary | ICD-10-CM

## 2011-11-03 DIAGNOSIS — E785 Hyperlipidemia, unspecified: Secondary | ICD-10-CM

## 2011-11-03 DIAGNOSIS — E1165 Type 2 diabetes mellitus with hyperglycemia: Secondary | ICD-10-CM

## 2011-11-03 DIAGNOSIS — E119 Type 2 diabetes mellitus without complications: Secondary | ICD-10-CM

## 2011-11-03 DIAGNOSIS — Z23 Encounter for immunization: Secondary | ICD-10-CM

## 2011-11-03 DIAGNOSIS — G8929 Other chronic pain: Secondary | ICD-10-CM

## 2011-11-03 MED ORDER — INSULIN NPH (HUMAN) (ISOPHANE) 100 UNIT/ML ~~LOC~~ SUSP: SUBCUTANEOUS | Status: DC

## 2011-11-03 NOTE — Progress Notes (Signed)
Subjective:    Patient ID: [name], female    DOB: [birth year], 50 y.o.   MRN: [id number]  HPI 50 yo AA female with history of DM  here for followup on uncontrolled DM, a1c was 9.5 in September 2011.  Started taking Lantus after last visit,  She is not exercising but is avoid excessive starches. Using  36 units daily.  Did not bring her blood sugar log with her .  Recalls that her most recent high was 237, which was a morning post prandial.  Evening bs have been lower, 80 to 120, post prandial.  Has not checked it in 3 days.  She appears less concerned about her diabetes due to persistent pain. Reports right ankle pain with no prior trauma.  Past Medical History  Diagnosis Date  . Hyperlipidemia   . Diabetes mellitus   . Gastritis with bleeding due to alcohol 2010    [facility] admission  . Campylobacter enteritis May 2012    [facility] admission  . Hypertension   . Arthritis     Current Outpatient Prescriptions on File Prior to Visit  Medication Sig Dispense Refill  . esomeprazole (NEXIUM) 40 MG capsule Take 1 capsule (40 mg total) by mouth daily.  30 capsule  1  . glipiZIDE (GLUCOTROL) 10 MG tablet Take 1 tablet (10 mg total) by mouth 2 (two) times daily before a meal.  60 tablet  11  . HYDROcodone-acetaminophen (LORTAB) 7.5-500 MG per tablet TAKE ONE TABLET BY MOUTH AT BEDTIME AS NEEDED FOR PAIN  30 tablet  0  . insulin glargine (LANTUS SOLOSTAR) 100 UNIT/ML injection Inject 30 Units into the skin at bedtime.  10 mL  12  . medroxyPROGESTERone (DEPO-PROVERA) 150 MG/ML injection Inject 150 mg into the muscle every 3 (three) months.        . metFORMIN (GLUCOPHAGE) 1000 MG tablet Take 1 tablet (1,000 mg total) by mouth 2 (two) times daily with a meal.  60 tablet  11  . simvastatin (ZOCOR) 40 MG tablet Take 40 mg by mouth at bedtime.        . traMADol (ULTRAM) 50 MG tablet Take 1 tablet (50 mg total) by mouth every 6 (six) hours as needed for pain.  120 tablet  1    Review of Systems    Constitutional: Negative for fever, chills and unexpected weight change.  HENT: Negative for hearing loss, ear pain, nosebleeds, congestion, sore throat, facial swelling, rhinorrhea, sneezing, mouth sores, trouble swallowing, neck pain, neck stiffness, voice change, postnasal drip, sinus pressure, tinnitus and ear discharge.   Eyes: Negative for pain, discharge, redness and visual disturbance.  Respiratory: Negative for cough, chest tightness, shortness of breath, wheezing and stridor.   Cardiovascular: Negative for chest pain, palpitations and leg swelling.  Musculoskeletal: Positive for arthralgias. Negative for myalgias.  Skin: Negative for color change and rash.  Neurological: Negative for dizziness, weakness, light-headedness and headaches.  Hematological: Negative for adenopathy.       Objective:   Physical Exam  Constitutional: She is oriented to person, place, and time. She appears well-developed and well-nourished.  HENT:  Mouth/Throat: Oropharynx is clear and moist.  Eyes: EOM are normal. Pupils are equal, round, and reactive to light. No scleral icterus.  Neck: Normal range of motion. Neck supple. No JVD present. No thyromegaly present.  Cardiovascular: Normal rate, regular rhythm, normal heart sounds and intact distal pulses.   Pulmonary/Chest: Effort normal and breath sounds normal.  Abdominal: Soft. Bowel sounds are normal. She  exhibits no mass. There is no tenderness.  Musculoskeletal: Normal range of motion. She exhibits no edema.  Lymphadenopathy:    She has no cervical adenopathy.  Neurological: She is alert and oriented to person, place, and time.  Skin: Skin is warm and dry.  Psychiatric: She has a normal mood and affect.       Assessment & Plan:   DIABETES MELLITUS, TYPE II She has minimally improved control with addition of Lantus.  Changing to NPH to help loer fasting glucose levels,  Will start with 15 units and increase by 3 units every 3 days.    Polyarthritis of ankle and foot Checking serologies for inflammatory etiologies, referral to Rheumatology planned.     Updated Medication List Outpatient Encounter Prescriptions as of 11/03/2011  Medication Sig Dispense Refill  . esomeprazole (NEXIUM) 40 MG capsule Take 1 capsule (40 mg total) by mouth daily.  30 capsule  1  . glipiZIDE (GLUCOTROL) 10 MG tablet Take 1 tablet (10 mg total) by mouth 2 (two) times daily before a meal.  60 tablet  11  . HYDROcodone-acetaminophen (LORTAB) 7.5-500 MG per tablet TAKE ONE TABLET BY MOUTH AT BEDTIME AS NEEDED FOR PAIN  30 tablet  0  . insulin glargine (LANTUS SOLOSTAR) 100 UNIT/ML injection Inject 30 Units into the skin at bedtime.  10 mL  12  . medroxyPROGESTERone (DEPO-PROVERA) 150 MG/ML injection Inject 150 mg into the muscle every 3 (three) months.        . metFORMIN (GLUCOPHAGE) 1000 MG tablet Take 1 tablet (1,000 mg total) by mouth 2 (two) times daily with a meal.  60 tablet  11  . simvastatin (ZOCOR) 40 MG tablet Take 40 mg by mouth at bedtime.        . traMADol (ULTRAM) 50 MG tablet Take 1 tablet (50 mg total) by mouth every 6 (six) hours as needed for pain.  120 tablet  1  . insulin NPH (NOVOLIN N) 100 UNIT/ML injection Inject 15 units at bedtime  15 mL  3  . DISCONTD: HYDROcodone-acetaminophen (VICODIN) 5-500 MG per tablet Take 1 tablet by mouth 2 (two) times daily as needed.        Marland Kitchen DISCONTD: sitaGLIPtin (JANUVIA) 100 MG tablet Take 100 mg by mouth daily.

## 2011-11-03 NOTE — Patient Instructions (Signed)
We are changing your insulin from Lantus to NPH,  whic hwill help brring your morning sugars dow to gaol.    Start with 10 units at bedtime. Increase by 3 units every 3 days until your morning fasting blood sugar is < 150.    We will referring you to Dr. Kathi Ludwig, a rheumatologist for evaluation of your joint pain if your labs are abnormal..   return in one week for labs

## 2011-11-04 ENCOUNTER — Encounter: Payer: Self-pay | Admitting: Internal Medicine

## 2011-11-04 DIAGNOSIS — M130 Polyarthritis, unspecified: Secondary | ICD-10-CM | POA: Insufficient documentation

## 2011-11-04 DIAGNOSIS — M1990 Unspecified osteoarthritis, unspecified site: Secondary | ICD-10-CM | POA: Insufficient documentation

## 2011-11-04 NOTE — Assessment & Plan Note (Signed)
Checking serologies for inflammatory etiologies, referral to Rheumatology planned.

## 2011-11-04 NOTE — Assessment & Plan Note (Signed)
She has minimally improved control with addition of Lantus.  Changing to NPH to help loer fasting glucose levels,  Will start with 15 units and increase by 3 units every 3 days.

## 2011-11-20 ENCOUNTER — Other Ambulatory Visit (INDEPENDENT_AMBULATORY_CARE_PROVIDER_SITE_OTHER): Payer: BC Managed Care – PPO | Admitting: *Deleted

## 2011-11-20 DIAGNOSIS — E119 Type 2 diabetes mellitus without complications: Secondary | ICD-10-CM

## 2011-11-20 DIAGNOSIS — IMO0001 Reserved for inherently not codable concepts without codable children: Secondary | ICD-10-CM

## 2011-11-20 DIAGNOSIS — M129 Arthropathy, unspecified: Secondary | ICD-10-CM

## 2011-11-20 DIAGNOSIS — E1165 Type 2 diabetes mellitus with hyperglycemia: Secondary | ICD-10-CM

## 2011-11-20 DIAGNOSIS — E785 Hyperlipidemia, unspecified: Secondary | ICD-10-CM

## 2011-11-20 DIAGNOSIS — G8929 Other chronic pain: Secondary | ICD-10-CM

## 2011-11-20 LAB — HEMOGLOBIN A1C: Hgb A1c MFr Bld: 8.4 % — ABNORMAL HIGH (ref 4.6–6.5)

## 2011-11-20 LAB — SEDIMENTATION RATE: Sed Rate: 19 mm/hr (ref 0–22)

## 2011-11-20 LAB — BASIC METABOLIC PANEL
BUN: 12 mg/dL (ref 6–23)
GFR: 135.1 mL/min (ref >60.00)
Potassium: 3.5 mEq/L (ref 3.5–5.1)
Sodium: 141 mEq/L (ref 135–145)

## 2011-11-20 LAB — MICROALBUMIN / CREATININE URINE RATIO
Creatinine,U: 15.5 mg/dL
Microalb, Ur: 0.4 mg/dL (ref 0.0–1.9)

## 2011-11-21 LAB — DRUG SCREEN, URINE
Amphetamine Screen, Ur: NEGATIVE
Benzodiazepines.: NEGATIVE
Cocaine Metabolites: NEGATIVE
Methadone: NEGATIVE

## 2011-12-18 LAB — HM MAMMOGRAPHY

## 2011-12-22 ENCOUNTER — Telehealth: Payer: Self-pay | Admitting: Internal Medicine

## 2011-12-22 MED ORDER — SULFAMETHOXAZOLE-TRIMETHOPRIM 800-160 MG PO TABS: ORAL_TABLET | ORAL | Status: DC

## 2011-12-22 NOTE — Telephone Encounter (Signed)
Patient has a big boil on her left leg and her left thumb hurts and sometimes she has cramps in it.

## 2011-12-22 NOTE — Telephone Encounter (Signed)
Advised patient, appt made for next week, medicine called to pharmacy.

## 2011-12-22 NOTE — Telephone Encounter (Signed)
Please call her in Septra DS one tablet twice daily  For 7 days  #14 for the boil.  Giver her an appt either by the end of the week or after I come back from my conference about the thumb.

## 2011-12-31 ENCOUNTER — Encounter: Payer: Self-pay | Admitting: Internal Medicine

## 2011-12-31 ENCOUNTER — Ambulatory Visit (INDEPENDENT_AMBULATORY_CARE_PROVIDER_SITE_OTHER): Payer: BC Managed Care – PPO | Admitting: Internal Medicine

## 2011-12-31 DIAGNOSIS — M659 Synovitis and tenosynovitis, unspecified: Secondary | ICD-10-CM

## 2011-12-31 DIAGNOSIS — M65849 Other synovitis and tenosynovitis, unspecified hand: Secondary | ICD-10-CM

## 2011-12-31 DIAGNOSIS — M654 Radial styloid tenosynovitis [de Quervain]: Secondary | ICD-10-CM

## 2011-12-31 MED ORDER — CELECOXIB 200 MG PO CAPS: 200.0000 mg | ORAL_CAPSULE | Freq: Every day | ORAL | Status: AC

## 2011-12-31 NOTE — Progress Notes (Signed)
Subjective:    Patient ID: [name], female    DOB: 07/29/1961, 51 y.o.   MRN: [id number]  HPI  [name] is a 51 year old African American female with history of diabetes and hypertension who presents with a two-month history of left thumb pain which radiates  to shoulder.  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 She states that the pain has has become progressively worse over the last 2 months it is now hurting constantly. When she uses to some the pain will shoot up her arm to her shoulder. She has avoided nonsteroidals given her history of gastritis  With a GI bleed in the last 2 years.however her gastritis was due to alcohol abuse. She is having trouble sleeping due to the pain.   Past Medical History  Diagnosis Date  . Hyperlipidemia   . Diabetes mellitus   . Gastritis with bleeding due to alcohol 2010    [facility] admission  . Campylobacter enteritis May 2012    [facility] admission  . Hypertension   . Arthritis    Current Outpatient Prescriptions on File Prior to Visit  Medication Sig Dispense Refill  . esomeprazole (NEXIUM) 40 MG capsule Take 1 capsule (40 mg total) by mouth daily.  30 capsule  1  . glipiZIDE (GLUCOTROL) 10 MG tablet Take 1 tablet (10 mg total) by mouth 2 (two) times daily before a meal.  60 tablet  11  . HYDROcodone-acetaminophen (LORTAB) 7.5-500 MG per tablet TAKE ONE TABLET BY MOUTH AT BEDTIME AS NEEDED FOR PAIN  30 tablet  0  . insulin NPH (NOVOLIN N) 100 UNIT/ML injection Inject 15 units at bedtime  15 mL  3  . medroxyPROGESTERone (DEPO-PROVERA) 150 MG/ML injection Inject 150 mg into the muscle every 3 (three) months.        . metFORMIN (GLUCOPHAGE) 1000 MG tablet Take 1 tablet (1,000 mg total) by mouth 2 (two) times daily with a meal.  60 tablet  11  . simvastatin (ZOCOR) 40 MG tablet Take 40 mg by mouth at bedtime.        . traMADol (ULTRAM) 50 MG tablet Take 1  tablet (50 mg total) by mouth every 6 (six) hours as needed for pain.  120 tablet  1    Review of Systems  Constitutional: Negative for fever, chills and unexpected weight change.  HENT: Negative for hearing loss, ear pain, nosebleeds, congestion, sore throat, facial swelling, rhinorrhea, sneezing, mouth sores, trouble swallowing, neck pain, neck stiffness, voice change, postnasal drip, sinus pressure, tinnitus and ear discharge.   Eyes: Negative for pain, discharge, redness and visual disturbance.  Respiratory: Negative for cough, chest tightness, shortness of breath, wheezing and stridor.   Cardiovascular: Negative for chest pain, palpitations and leg swelling.  Musculoskeletal: Positive for arthralgias. Negative for myalgias.  Skin: Negative for color change and rash.  Neurological: Negative for dizziness, weakness, light-headedness and headaches.  Hematological: Negative for adenopathy.       Objective:   Physical Exam  Constitutional: She is oriented to person, place, and time. She appears well-developed and well-nourished.  HENT:  Mouth/Throat: Oropharynx is clear and moist.  Eyes: EOM are normal. Pupils are equal, round, and reactive to light. No scleral icterus.  Neck: Normal range of motion. Neck supple. No JVD present. No thyromegaly present.  Cardiovascular: Normal rate, regular rhythm, normal heart sounds and intact distal pulses.  Pulmonary/Chest: Effort normal and breath sounds normal.  Abdominal: Soft. Bowel sounds are normal. She exhibits no mass. There is no tenderness.  Musculoskeletal: Normal range of motion. She exhibits no edema.  Lymphadenopathy:    She has no cervical adenopathy.  Neurological: She is alert and oriented to person, place, and time.  Skin: Skin is warm and dry.  Psychiatric: She has a normal mood and affect.      Assessment & Plan: Tenosynovitis of thumb Her exam is relatively normal but by description and history she has tenosynovitis  possibly de Quervain's. I have ordered a thumb splint for her to wear for the next 2 weeks 24 7 and did have given her samples of Celebrex 200 mg daily which she can combine with her tramadol or Vicodin. If her pain has not improved in 2 weeks she'll need referral to orthopedics.    Updated Medication List Outpatient Encounter Prescriptions as of 12/31/2011  Medication Sig Dispense Refill  . esomeprazole (NEXIUM) 40 MG capsule Take 1 capsule (40 mg total) by mouth daily.  30 capsule  1  . glipiZIDE (GLUCOTROL) 10 MG tablet Take 1 tablet (10 mg total) by mouth 2 (two) times daily before a meal.  60 tablet  11  . HYDROcodone-acetaminophen (LORTAB) 7.5-500 MG per tablet TAKE ONE TABLET BY MOUTH AT BEDTIME AS NEEDED FOR PAIN  30 tablet  0  . insulin glargine (LANTUS) 100 UNIT/ML injection Inject 28 Units into the skin at bedtime.      . insulin NPH (NOVOLIN N) 100 UNIT/ML injection Inject 15 units at bedtime  15 mL  3  . medroxyPROGESTERone (DEPO-PROVERA) 150 MG/ML injection Inject 150 mg into the muscle every 3 (three) months.        . metFORMIN (GLUCOPHAGE) 1000 MG tablet Take 1 tablet (1,000 mg total) by mouth 2 (two) times daily with a meal.  60 tablet  11  . simvastatin (ZOCOR) 40 MG tablet Take 40 mg by mouth at bedtime.        . traMADol (ULTRAM) 50 MG tablet Take 1 tablet (50 mg total) by mouth every 6 (six) hours as needed for pain.  120 tablet  1  . celecoxib (CELEBREX) 200 MG capsule Take 1 capsule (200 mg total) by mouth daily.  15 capsule  0  . DISCONTD: insulin glargine (LANTUS SOLOSTAR) 100 UNIT/ML injection Inject 30 Units into the skin at bedtime.  10 mL  12  . DISCONTD: sulfamethoxazole-trimethoprim (BACTRIM DS) 800-160 MG per tablet Take one tablet twice a day times 7 days.  14 tablet  0      Tenosynovitis of thumb Her exam is relatively normal but by description and history she has tenosynovitis possibly de Quervain's. I have ordered a thumb splint for her to wear for the next 2  weeks 24 7 and did have given her samples of Celebrex 200 mg daily which she can combine with her tramadol or Vicodin. If her pain has not improved in 2 weeks she'll need referral to orthopedics.    Updated Medication List Outpatient Encounter Prescriptions as of 12/31/2011  Medication Sig Dispense Refill  . esomeprazole (NEXIUM) 40 MG capsule Take 1 capsule (40 mg total) by mouth daily.  30 capsule  1  . glipiZIDE (GLUCOTROL) 10 MG tablet Take 1 tablet (10 mg total) by mouth 2 (two) times daily before a meal.  60 tablet  11  . HYDROcodone-acetaminophen (LORTAB) 7.5-500 MG per tablet TAKE ONE TABLET BY MOUTH AT BEDTIME AS  NEEDED FOR PAIN  30 tablet  0  . insulin glargine (LANTUS) 100 UNIT/ML injection Inject 28 Units into the skin at bedtime.      . insulin NPH (NOVOLIN N) 100 UNIT/ML injection Inject 15 units at bedtime  15 mL  3  . medroxyPROGESTERone (DEPO-PROVERA) 150 MG/ML injection Inject 150 mg into the muscle every 3 (three) months.        . metFORMIN (GLUCOPHAGE) 1000 MG tablet Take 1 tablet (1,000 mg total) by mouth 2 (two) times daily with a meal.  60 tablet  11  . simvastatin (ZOCOR) 40 MG tablet Take 40 mg by mouth at bedtime.        . traMADol (ULTRAM) 50 MG tablet Take 1 tablet (50 mg total) by mouth every 6 (six) hours as needed for pain.  120 tablet  1  . celecoxib (CELEBREX) 200 MG capsule Take 1 capsule (200 mg total) by mouth daily.  15 capsule  0  . DISCONTD: insulin glargine (LANTUS SOLOSTAR) 100 UNIT/ML injection Inject 30 Units into the skin at bedtime.  10 mL  12  . DISCONTD: sulfamethoxazole-trimethoprim (BACTRIM DS) 800-160 MG per tablet Take one tablet twice a day times 7 days.  14 tablet  0

## 2011-12-31 NOTE — Patient Instructions (Signed)
I am prescribing celebrex 20 mg daily and a padded thumb splint for you to wear 24/7 for the next two weeks.  If you have not seen an improvement in 2 weeks,  Call for a referral to an orthopedist.

## 2012-01-02 ENCOUNTER — Encounter: Payer: Self-pay | Admitting: Internal Medicine

## 2012-01-02 DIAGNOSIS — M659 Synovitis and tenosynovitis, unspecified: Secondary | ICD-10-CM | POA: Insufficient documentation

## 2012-01-02 NOTE — Assessment & Plan Note (Signed)
Her exam is relatively normal but by description and history she has tenosynovitis possibly de Quervain's. I have ordered a thumb splint for her to wear for the next 2 weeks 24 7 and did have given her samples of Celebrex 200 mg daily which she can combine with her tramadol or Vicodin. If her pain has not improved in 2 weeks she'll need referral to orthopedics.

## 2012-01-18 ENCOUNTER — Ambulatory Visit: Payer: Self-pay | Admitting: Internal Medicine

## 2012-01-18 LAB — HM MAMMOGRAPHY: HM Mammogram: ABNORMAL

## 2012-01-19 ENCOUNTER — Other Ambulatory Visit: Payer: Self-pay | Admitting: Internal Medicine

## 2012-01-19 DIAGNOSIS — N63 Unspecified lump in unspecified breast: Secondary | ICD-10-CM

## 2012-01-21 ENCOUNTER — Telehealth: Payer: Self-pay | Admitting: *Deleted

## 2012-01-21 DIAGNOSIS — N63 Unspecified lump in unspecified breast: Secondary | ICD-10-CM

## 2012-01-21 NOTE — Telephone Encounter (Signed)
Kristie at [facility] called regarding pt's mammogram and ultrasound report.  Radiologist felt that results were negative but did suggest a surgical evaluation if you felt it was clinically needed.  They just wanted to bring this to your attention and Danford Bad has asked to be notified if pt is referred to surgeon.     Order for mammogram was faxed to [facility] this morning.

## 2012-01-21 NOTE — Telephone Encounter (Signed)
Dr. Darrick Huntsman, you marked on mammogram report that you are going to refer pt to surgeon.  Please advise.

## 2012-01-21 NOTE — Telephone Encounter (Signed)
Mammogram order needed

## 2012-01-21 NOTE — Telephone Encounter (Signed)
Since i have not examined the patient , this was a self described breast mass, I would need her to come in for an exam before I decide on referring.

## 2012-01-22 NOTE — Telephone Encounter (Signed)
Yes after reading the report I would like to rlefer her to Dr Lemar Livings  o r Dr Evette Cristal for evaluation

## 2012-01-25 NOTE — Telephone Encounter (Signed)
Patient is seeing dr. Birdie Sons on 02/02/12 at 9:30.

## 2012-01-26 ENCOUNTER — Telehealth: Payer: Self-pay | Admitting: Internal Medicine

## 2012-01-26 NOTE — Telephone Encounter (Signed)
Does not meet criteria for handicapped sticker,.  2) referral to [facility]se was ordered days ago,.  Has shannon contacted patient yet?

## 2012-01-26 NOTE — Telephone Encounter (Signed)
The bump on her left breast has gotten bigger and her breast is tender and dark. She also needs a handicap sticker. Please give her a call.

## 2012-01-29 NOTE — Telephone Encounter (Signed)
Patient has an appt with Dr. Lemar Livings on 02/02/12 at 9:30.  I gave patient his number and address.

## 2012-02-01 ENCOUNTER — Ambulatory Visit (INDEPENDENT_AMBULATORY_CARE_PROVIDER_SITE_OTHER): Payer: BC Managed Care – PPO | Admitting: Internal Medicine

## 2012-02-01 ENCOUNTER — Encounter: Payer: Self-pay | Admitting: Internal Medicine

## 2012-02-01 DIAGNOSIS — E119 Type 2 diabetes mellitus without complications: Secondary | ICD-10-CM

## 2012-02-01 DIAGNOSIS — M5430 Sciatica, unspecified side: Secondary | ICD-10-CM

## 2012-02-01 DIAGNOSIS — N63 Unspecified lump in unspecified breast: Secondary | ICD-10-CM | POA: Insufficient documentation

## 2012-02-01 MED ORDER — SULFAMETHOXAZOLE-TRIMETHOPRIM 800-160 MG PO TABS: 1.0000 | ORAL_TABLET | Freq: Two times a day (BID) | ORAL | Status: DC

## 2012-02-01 MED ORDER — HYDROCODONE-ACETAMINOPHEN 7.5-500 MG PO TABS: 1.0000 | ORAL_TABLET | Freq: Four times a day (QID) | ORAL | Status: DC | PRN

## 2012-02-01 MED ORDER — INSULIN NPH (HUMAN) (ISOPHANE) 100 UNIT/ML ~~LOC~~ SUSP: 45.0000 [IU] | Freq: Every day | SUBCUTANEOUS | Status: DC

## 2012-02-01 NOTE — Patient Instructions (Signed)
Add 20 units of insulin in the morning;  Continue 45 units units at night  Start the Septra antibiotic tonight for your breast infection

## 2012-02-01 NOTE — Assessment & Plan Note (Signed)
Uncontrolled due to current breast infection.  Adding NPH 20 units in the morning

## 2012-02-01 NOTE — Progress Notes (Signed)
  Subjective:    Patient ID: [name], female    DOB: 01-08-1961, 51 y.o.   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 Seen last month and treated last month for deQurevain tenosynovitis of thumb with rx for wrist splint, which she just started wearing one week ago.  Sugars have been elevated for the past week to nearly 200 despite use of NPH insulin 45 units.   Past Medical History  Diagnosis Date  . Hyperlipidemia   . Diabetes mellitus   . Gastritis with bleeding due to alcohol 2010    [facility] admission  . Campylobacter enteritis May 2012    [facility] admission  . Hypertension   . Arthritis    Current Outpatient Prescriptions on File Prior to Visit  Medication Sig Dispense Refill  . esomeprazole (NEXIUM) 40 MG capsule Take 1 capsule (40 mg total) by mouth daily.  30 capsule  1  . glipiZIDE (GLUCOTROL) 10 MG tablet Take 1 tablet (10 mg total) by mouth 2 (two) times daily before a meal.  60 tablet  11  . medroxyPROGESTERone (DEPO-PROVERA) 150 MG/ML injection Inject 150 mg into the muscle every 3 (three) months.        . metFORMIN (GLUCOPHAGE) 1000 MG tablet Take 1 tablet (1,000 mg total) by mouth 2 (two) times daily with a meal.  60 tablet  11  . simvastatin (ZOCOR) 40 MG tablet Take 40 mg by mouth at bedtime.        . traMADol (ULTRAM) 50 MG tablet Take 1 tablet (50 mg total) by mouth every 6 (six) hours as needed for pain.  120 tablet  1     Review of Systems  Constitutional: Negative for fever, chills and unexpected weight change.  HENT: Negative for hearing loss, ear pain, nosebleeds, congestion, sore throat, facial swelling, rhinorrhea, sneezing, mouth sores, trouble swallowing, neck pain, neck stiffness, voice change, postnasal drip, sinus pressure, tinnitus and ear discharge.     Eyes: Negative for pain, discharge, redness and visual disturbance.  Respiratory: Negative for cough, chest tightness, shortness of breath, wheezing and stridor.   Cardiovascular: Negative for chest pain, palpitations and leg swelling.  Musculoskeletal: Positive for arthralgias. Negative for myalgias.  Skin: Negative for color change and rash.  Neurological: Negative for dizziness, weakness, light-headedness and headaches.  Hematological: Negative for adenopathy.  Psychiatric/Behavioral: The patient is hyperactive.        Objective:   Physical Exam  Pulmonary/Chest:            Assessment & Plan:

## 2012-02-01 NOTE — Assessment & Plan Note (Signed)
Nonmalignat,  Due to probable abscess.  Has appt with Dr. Lemar Livings tomorrow for incision and drainage .  Starting Septra bid.

## 2012-02-02 ENCOUNTER — Telehealth: Payer: Self-pay | Admitting: Internal Medicine

## 2012-02-02 NOTE — Telephone Encounter (Signed)
I have the placard ready for Dr. Darrick Huntsman to sign.  I will notify patient when ready for pickup.

## 2012-02-02 NOTE — Telephone Encounter (Signed)
Pt would like to come by today to get a handicapp paperwork.

## 2012-02-05 ENCOUNTER — Encounter: Payer: Self-pay | Admitting: Internal Medicine

## 2012-02-12 ENCOUNTER — Other Ambulatory Visit: Payer: Self-pay | Admitting: Internal Medicine

## 2012-02-14 NOTE — Telephone Encounter (Signed)
Patient will need to come in for a UDS prior to any more refills.

## 2012-02-15 ENCOUNTER — Telehealth: Payer: Self-pay | Admitting: Internal Medicine

## 2012-02-15 DIAGNOSIS — M79646 Pain in unspecified finger(s): Secondary | ICD-10-CM

## 2012-02-15 NOTE — Telephone Encounter (Signed)
Referral to Dr. Charma Igo

## 2012-02-15 NOTE — Telephone Encounter (Signed)
Patient is coming in tomorrow for UDS.

## 2012-02-15 NOTE — Telephone Encounter (Signed)
Patient called and wanted to know if you could refer her to a hand specialist for her thumb.  She stated she has been wearing the splint for a few weeks and it is still painful.   She stated she talked to you about this at her last appt.

## 2012-02-16 ENCOUNTER — Other Ambulatory Visit (INDEPENDENT_AMBULATORY_CARE_PROVIDER_SITE_OTHER): Payer: BC Managed Care – PPO | Admitting: *Deleted

## 2012-02-16 DIAGNOSIS — G8929 Other chronic pain: Secondary | ICD-10-CM

## 2012-02-16 NOTE — Telephone Encounter (Signed)
Patient notified

## 2012-02-17 LAB — DRUG SCREEN, URINE
Creatinine,U: 44.83 mg/dL
Methadone: NEGATIVE
Opiates: NEGATIVE
Phencyclidine (PCP): NEGATIVE
Propoxyphene: NEGATIVE

## 2012-02-24 ENCOUNTER — Telehealth: Payer: Self-pay | Admitting: Internal Medicine

## 2012-02-24 DIAGNOSIS — N611 Abscess of the breast and nipple: Secondary | ICD-10-CM

## 2012-02-24 NOTE — Telephone Encounter (Signed)
[phone] Pt called  Pt was here 2 weeks ago about breast with cyst on breast. Pt stated that her right under arm draining a little and it has oder  Can you call her something in for this.   [facility]  [address]

## 2012-02-25 MED ORDER — SULFAMETHOXAZOLE-TRIMETHOPRIM 800-160 MG PO TABS: 1.0000 | ORAL_TABLET | Freq: Two times a day (BID) | ORAL | Status: AC

## 2012-02-25 NOTE — Telephone Encounter (Signed)
I called patient she stated there is a small bump under her arm not on her breast, and she thinks it is draining because after she washes under her arms she still has an odor.  She has not seen any fluid come out of the bump, she just stated it makes under her arm smell bad.  She denies fever.  She wanted to know if you could call in an antibiotic for this.  Please advise.

## 2012-02-25 NOTE — Telephone Encounter (Signed)
Patient notified of Rx.  

## 2012-02-25 NOTE — Telephone Encounter (Signed)
7 day course of Septra sent to [facility]  For patient to take.

## 2012-04-11 ENCOUNTER — Other Ambulatory Visit: Payer: Self-pay | Admitting: Internal Medicine

## 2012-05-27 ENCOUNTER — Encounter: Payer: Self-pay | Admitting: Internal Medicine

## 2012-05-27 ENCOUNTER — Ambulatory Visit (INDEPENDENT_AMBULATORY_CARE_PROVIDER_SITE_OTHER): Payer: BC Managed Care – PPO | Admitting: Internal Medicine

## 2012-05-27 VITALS — BP 118/70 | HR 94 | Temp 98.3°F | Resp 16 | Wt 174.8 lb

## 2012-05-27 DIAGNOSIS — E1165 Type 2 diabetes mellitus with hyperglycemia: Secondary | ICD-10-CM

## 2012-05-27 DIAGNOSIS — M25579 Pain in unspecified ankle and joints of unspecified foot: Secondary | ICD-10-CM

## 2012-05-27 DIAGNOSIS — M25559 Pain in unspecified hip: Secondary | ICD-10-CM

## 2012-05-27 MED ORDER — FLUCONAZOLE 150 MG PO TABS: 150.0000 mg | ORAL_TABLET | Freq: Every day | ORAL | Status: AC

## 2012-05-27 NOTE — Patient Instructions (Signed)
continue the NPH until you hear from me.  .  Do not start the humalog mix until you hear from me

## 2012-05-27 NOTE — Progress Notes (Signed)
Patient ID: [name], female   DOB: 04/03/1961, 51 y.o.   MRN: [id number] Follow up on diabetes.  Has been taking NPH insulin twice  daily   25 units in the am and   And 45 in the evening but has not titrated her dose as directed at her last visit and her blood sugars remain elevated, in the 200s .  No lows.  Diet is full of starches.  Her niece has offered to help her follow a low carb diet if given a list of foods she can eat.  Second issue is chronic joint pain .  Right ankle continues to be stiff and painful with weight bearing and at time she has pain i her right hip  As well.  No history of fall or of trauma.  Prior serologic workup for inflammatory arthritis was negative. Frequent requests for vicodin

## 2012-05-28 LAB — COMPLETE METABOLIC PANEL WITH GFR
ALT: 17 U/L (ref 0–35)
AST: 13 U/L (ref 0–37)
Alkaline Phosphatase: 103 U/L (ref 39–117)
Calcium: 9.5 mg/dL (ref 8.4–10.5)
Chloride: 103 mEq/L (ref 96–112)
Creat: 0.7 mg/dL (ref 0.50–1.10)
Total Bilirubin: 0.4 mg/dL (ref 0.3–1.2)

## 2012-06-01 ENCOUNTER — Ambulatory Visit (HOSPITAL_COMMUNITY)
Admission: RE | Admit: 2012-06-01 | Discharge: 2012-06-01 | Disposition: A | Discharge status: Home / Self Care | Payer: BC Managed Care – PPO | Source: Ambulatory Visit | Attending: Internal Medicine | Admitting: Internal Medicine

## 2012-06-01 DIAGNOSIS — M25559 Pain in unspecified hip: Secondary | ICD-10-CM | POA: Insufficient documentation

## 2012-06-01 DIAGNOSIS — M25579 Pain in unspecified ankle and joints of unspecified foot: Secondary | ICD-10-CM | POA: Insufficient documentation

## 2012-06-01 DIAGNOSIS — M7730 Calcaneal spur, unspecified foot: Secondary | ICD-10-CM | POA: Insufficient documentation

## 2012-06-06 ENCOUNTER — Telehealth: Payer: Self-pay | Admitting: Internal Medicine

## 2012-06-06 ENCOUNTER — Emergency Department (HOSPITAL_COMMUNITY)
Admission: EM | Admit: 2012-06-06 | Discharge: 2012-06-06 | Disposition: A | Discharge status: Home / Self Care | Payer: BC Managed Care – PPO | Attending: Emergency Medicine | Admitting: Emergency Medicine

## 2012-06-06 DIAGNOSIS — Y92009 Unspecified place in unspecified non-institutional (private) residence as the place of occurrence of the external cause: Secondary | ICD-10-CM | POA: Insufficient documentation

## 2012-06-06 DIAGNOSIS — Z794 Long term (current) use of insulin: Secondary | ICD-10-CM | POA: Insufficient documentation

## 2012-06-06 DIAGNOSIS — E119 Type 2 diabetes mellitus without complications: Secondary | ICD-10-CM | POA: Insufficient documentation

## 2012-06-06 DIAGNOSIS — X131XXA Other contact with steam and other hot vapors, initial encounter: Secondary | ICD-10-CM | POA: Insufficient documentation

## 2012-06-06 DIAGNOSIS — X12XXXA Contact with other hot fluids, initial encounter: Secondary | ICD-10-CM | POA: Insufficient documentation

## 2012-06-06 DIAGNOSIS — I10 Essential (primary) hypertension: Secondary | ICD-10-CM | POA: Insufficient documentation

## 2012-06-06 DIAGNOSIS — T300 Burn of unspecified body region, unspecified degree: Secondary | ICD-10-CM | POA: Insufficient documentation

## 2012-06-06 MED ORDER — SILVER SULFADIAZINE 1 % EX CREA
TOPICAL_CREAM | Freq: Once | CUTANEOUS | Status: AC
  Administered 2012-06-06: 13:00:00 via TOPICAL
  Filled 2012-06-06: qty 85

## 2012-06-06 MED ORDER — TETANUS-DIPHTHERIA TOXOIDS TD 5-2 LFU IM INJ
0.5000 mL | INJECTION | Freq: Once | INTRAMUSCULAR | Status: AC
  Administered 2012-06-06: 0.5 mL via INTRAMUSCULAR
  Filled 2012-06-06: qty 0.5

## 2012-06-06 MED ORDER — SILVER SULFADIAZINE 1 % EX CREA: TOPICAL_CREAM | Freq: Once | CUTANEOUS | Status: DC
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
## 2012-06-06 NOTE — Progress Notes (Signed)
Orthopedic Tech Progress Note Patient Details:  [name] 1961/11/05 [id number]  Ortho Devices Type of Ortho Device: Postop boot Ortho Device/Splint Interventions: Application   Cammer, Mickie Bail 06/06/2012, 12:49 PM

## 2012-06-07 ENCOUNTER — Other Ambulatory Visit: Payer: Self-pay | Admitting: Internal Medicine

## 2012-06-07 DIAGNOSIS — M25571 Pain in right ankle and joints of right foot: Secondary | ICD-10-CM

## 2012-06-16 ENCOUNTER — Encounter (HOSPITAL_BASED_OUTPATIENT_CLINIC_OR_DEPARTMENT_OTHER): Payer: BC Managed Care – PPO | Attending: Internal Medicine

## 2012-06-16 DIAGNOSIS — Y93G3 Activity, cooking and baking: Secondary | ICD-10-CM | POA: Insufficient documentation

## 2012-06-16 DIAGNOSIS — I10 Essential (primary) hypertension: Secondary | ICD-10-CM | POA: Insufficient documentation

## 2012-06-16 DIAGNOSIS — E119 Type 2 diabetes mellitus without complications: Secondary | ICD-10-CM | POA: Insufficient documentation

## 2012-06-16 DIAGNOSIS — X19XXXA Contact with other heat and hot substances, initial encounter: Secondary | ICD-10-CM | POA: Insufficient documentation

## 2012-06-16 DIAGNOSIS — M129 Arthropathy, unspecified: Secondary | ICD-10-CM | POA: Insufficient documentation

## 2012-06-16 DIAGNOSIS — T25029A Burn of unspecified degree of unspecified foot, initial encounter: Secondary | ICD-10-CM | POA: Insufficient documentation

## 2012-06-16 DIAGNOSIS — Y92009 Unspecified place in unspecified non-institutional (private) residence as the place of occurrence of the external cause: Secondary | ICD-10-CM | POA: Insufficient documentation
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
## 2012-07-14 ENCOUNTER — Encounter (HOSPITAL_BASED_OUTPATIENT_CLINIC_OR_DEPARTMENT_OTHER): Payer: BC Managed Care – PPO

## 2012-08-08 ENCOUNTER — Telehealth: Payer: Self-pay | Admitting: Internal Medicine

## 2012-08-08 NOTE — Telephone Encounter (Signed)
Pt called wanting a referral .  Pt doesn't know who the dr was Dr Darrick Huntsman has referred her to this dr for her breast the dr was in [place] Pt stated she has another cyst on her left breast

## 2012-08-08 NOTE — Telephone Encounter (Signed)
Left message asking patient to return my call.

## 2012-08-09 ENCOUNTER — Other Ambulatory Visit: Payer: Self-pay | Admitting: *Deleted

## 2012-08-09 MED ORDER — SULFAMETHOXAZOLE-TRIMETHOPRIM 800-160 MG PO TABS: 1.0000 | ORAL_TABLET | Freq: Two times a day (BID) | ORAL | Status: AC

## 2012-08-09 NOTE — Telephone Encounter (Signed)
I spoke with patient to get more information, she stated she has another cysts on her breasts and you sent her to Dr. Lemar Livings last time to have it drained.  She stated this time the cysts is draining on its own and wanted to know if she needed to go back to Dr. Lemar Livings or if she should come see you.  Your next available appt is not until Friday.  Please advise.

## 2012-08-09 NOTE — Telephone Encounter (Signed)
Left message asking patient to return my call.

## 2012-08-09 NOTE — Telephone Encounter (Signed)
If it is draining on its own she does not need ot see the surgeon unless it is very painful and swollen.  Please call her in Septra DS one tablet twice daily  X 7 days  #14   And give her Friday appt.

## 2012-08-09 NOTE — Telephone Encounter (Signed)
Patient notified. Rx sent to pharmacy. Appt scheduled for firday.

## 2012-08-12 ENCOUNTER — Encounter: Payer: Self-pay | Admitting: Internal Medicine

## 2012-08-12 ENCOUNTER — Ambulatory Visit (INDEPENDENT_AMBULATORY_CARE_PROVIDER_SITE_OTHER): Payer: BC Managed Care – PPO | Admitting: Internal Medicine

## 2012-08-12 VITALS — BP 110/78 | HR 88 | Temp 98.0°F | Wt 177.0 lb

## 2012-08-12 DIAGNOSIS — M129 Arthropathy, unspecified: Secondary | ICD-10-CM

## 2012-08-12 DIAGNOSIS — E785 Hyperlipidemia, unspecified: Secondary | ICD-10-CM

## 2012-08-12 DIAGNOSIS — E119 Type 2 diabetes mellitus without complications: Secondary | ICD-10-CM

## 2012-08-12 DIAGNOSIS — N760 Acute vaginitis: Secondary | ICD-10-CM

## 2012-08-12 DIAGNOSIS — N611 Abscess of the breast and nipple: Secondary | ICD-10-CM

## 2012-08-12 DIAGNOSIS — M1990 Unspecified osteoarthritis, unspecified site: Secondary | ICD-10-CM

## 2012-08-12 MED ORDER — FLUCONAZOLE 150 MG PO TABS: 150.0000 mg | ORAL_TABLET | Freq: Every day | ORAL | Status: AC

## 2012-08-12 NOTE — Patient Instructions (Addendum)
You need to use Hibiclens once a week in the shower.  The rest of the time use Dial or Lever 2000 on your body,  Save the Oil of O'Lay for your face.   Do not poke anything into the wound that is not sterile .  You do not need to see the surgeon,    Take the evening insulin before dinner

## 2012-08-14 ENCOUNTER — Encounter: Payer: Self-pay | Admitting: Internal Medicine

## 2012-08-14 DIAGNOSIS — N611 Abscess of the breast and nipple: Secondary | ICD-10-CM | POA: Insufficient documentation

## 2012-08-14 DIAGNOSIS — L02429 Furuncle of limb, unspecified: Secondary | ICD-10-CM | POA: Insufficient documentation

## 2012-08-14 NOTE — Assessment & Plan Note (Addendum)
Managed with simvastatin for goal LDL less than 70. Repeat lipids are due with next hemoglobin A1c.

## 2012-08-14 NOTE — Progress Notes (Signed)
Patient ID: [name], female   DOB: [birth year], 51 y.o.   MRN: [id number]  Patient Active Problem List  Diagnosis  . FIBROIDS, UTERUS  . DIABETES MELLITUS, TYPE II  . DYSLIPIDEMIA  . ALCOHOL ABUSE, EPISODIC  . HYPERTENSION  . GERD  . ENDOMETRIAL POLYP  . HIDRADENITIS SUPPURATIVA  . LOW BACK PAIN  . Screening for breast cancer  . Screening for colon cancer  . Arthritis  . Tenosynovitis of thumb  . Furuncle of breast    Subjective:  CC:   Chief Complaint  Patient presents with  . breast cyst    left  . Diabetes    HPI:   Wendy Goulet Hoskinsis a 51 y.o. female who presents a recurrent furuncle. Patient noticed tenderness and swelling under her left breast 4 or 5 days ago. In the past she had required incision and drainage of the same area by Dr. Doristine Counter of an inflamed sebaceous cyst vs furuncle. She has a history of hidrenaditis suppurativa involving the axillae and perineal areas. . She has been taking Septra DS for the last 3 days and applying  warm compresses . The area started to spontaneously drain about 48 hours ago. She has been cleaning the area with hydrogen peroxide and packing the space underneath her breast with sterile gauze. Problem #2 persistent bilateral leg pain right greater than left in the absence of degenerative joint disease by recent x-rays. She is under the care of an orthopedist now.  Problem #3 diabetes followup. She states she's been checking her sugars both fasting and in the evening and all have been higher than intended averaging around 200. She's been taking her evening insulin dose at bedtime instead of before dinner. She did not bring a log of her blood sugars.   Past Medical History  Diagnosis Date  . Hyperlipidemia   . Diabetes mellitus   . Gastritis with bleeding due to alcohol 2010    [facility] admission  . Campylobacter enteritis May 2012    [facility] admission  . Hypertension   . Arthritis     Past Surgical History  Procedure Date  .  Endometrial biopsy 2004    benign         The following portions of the patient's history were reviewed and updated as appropriate: Allergies, current medications, and problem list.    Review of Systems:   12 Pt  review of systems was negative except those addressed in the HPI,     History   Social History  . Marital Status: Single    Spouse Name: N/A    Number of Children: N/A  . Years of Education: N/A   Occupational History  . Not on file.   Social History Main Topics  . Smoking status: Former Games developer  . Smokeless tobacco: Never Used  . Alcohol Use: No  . Drug Use: No  . Sexually Active: Not on file   Other Topics Concern  . Not on file   Social History Narrative  . No narrative on file    Objective:  BP 110/78  Pulse 88  Temp 98 F (36.7 C) (Oral)  Wt 177 lb (80.287 kg)  General appearance: alert, cooperative and appears stated age Her Neck: no adenopathy, no carotid bruit, supple, symmetrical, trachea midline and thyroid not enlarged, symmetric, no tenderness/mass/nodules Back: symmetric, no curvature. ROM normal. No CVA tenderness. Her Breast; 2  cm furuncle vs cyst with 1 cm ellipitical opening on inferior surface of left breast.  Non tender,  No current drainage Lungs: clear to auscultation bilaterally Heart: regular rate and rhythm, S1, S2 normal, no murmur, click, rub or gallop Abdomen: soft, non-tender; bowel sounds normal; no masses,  no organomegaly Pulses: 2+ and symmetric Skin: Skin color, texture, turgor normal. No rashes or lesions Lymph nodes: Cervical, supraclavicular, and axillary nodes normal.  Assessment and Plan:  DIABETES MELLITUS, TYPE II Not well controlled currently and by last hemoglobin A1c. She has been taking her evening dose of 70/30 and correctly. She's been advised to take this before dinner and to follow up with me by phone or e-mail in 2  weeks with a log of her blood sugars. She's due for hemoglobin A1c at the end  of September or early October. She has no history of neuropathy or retinopathy. Exam today was normal.  DYSLIPIDEMIA Managed with simvastatin for goal LDL less than 70. Repeat lipids are due with next hemoglobin A1c.  Furuncle of breast Recurrent, inferior surface of  left breast, with prior incision and drainage several months ago by Dr. Doristine Counter. Currently the frontal is draining spontaneously. There is no surrounding erythema or fluctuance. Continue 7 day course of Bactrim and continue cleaning area with hydrogen peroxide and maintaining aeration with packing the area under the breast with gauze.  Arthritis She continues to report pain primarily on the right side including the leg knee and ankle. Hip and knee films recently were in normal. I did refer her to orthopedics to consider joint injections. She has not had any thus far.   Updated Medication List Outpatient Encounter Prescriptions as of 08/12/2012  Medication Sig Dispense Refill  . esomeprazole (NEXIUM) 40 MG capsule Take 40 mg by mouth daily.      Marland Kitchen glipiZIDE (GLUCOTROL) 10 MG tablet Take 1 tablet (10 mg total) by mouth 2 (two) times daily before a meal.  60 tablet  11  . Insulin Lispro Prot & Lispro (HUMALOG MIX 75/25 ) Inject 35-55 Units into the skin 2 (two) times daily. 35 units in am and 55 units in pm.      . medroxyPROGESTERone (DEPO-PROVERA) 150 MG/ML injection Inject 150 mg into the muscle every 3 (three) months.        . metFORMIN (GLUCOPHAGE) 1000 MG tablet Take 1 tablet (1,000 mg total) by mouth 2 (two) times daily with a meal.  60 tablet  11  . simvastatin (ZOCOR) 40 MG tablet Take 40 mg by mouth every evening.      . sulfamethoxazole-trimethoprim (BACTRIM DS,SEPTRA DS) 800-160 MG per tablet Take 1 tablet by mouth 2 (two) times daily.  14 tablet  0  . DISCONTD: esomeprazole (NEXIUM) 40 MG capsule Take 1 capsule (40 mg total) by mouth daily.  30 capsule  1  . DISCONTD: silver sulfADIAZINE (SILVADENE) 1 % cream Apply  topically once.  50 g  0  . fluconazole (DIFLUCAN) 150 MG tablet Take 1 tablet (150 mg total) by mouth daily.  2 tablet  0     Orders Placed This Encounter  Procedures  . Lipid panel  . Microalbumin / creatinine urine ratio  . Comprehensive metabolic panel  . Hemoglobin A1c  . POCT Glucose (CBG)    No Follow-up on file.

## 2012-08-14 NOTE — Assessment & Plan Note (Signed)
Not well controlled currently and by last hemoglobin A1c. She has been taking her evening dose of 70/30 and correctly. She's been advised to take this before dinner and to follow up with me by phone or e-mail in 2  weeks with a log of her blood sugars. She's due for hemoglobin A1c at the end of September or early October. She has no history of neuropathy or retinopathy. Exam today was normal.

## 2012-08-14 NOTE — Assessment & Plan Note (Signed)
She continues to report pain primarily on the right side including the leg knee and ankle. Hip and knee films recently were in normal. I did refer her to orthopedics to consider joint injections. She has not had any thus far.

## 2012-08-14 NOTE — Assessment & Plan Note (Signed)
Recurrent, inferior surface of  left breast, with prior incision and drainage several months ago by Dr. Doristine Counter. Currently the frontal is draining spontaneously. There is no surrounding erythema or fluctuance. Continue 7 day course of Bactrim and continue cleaning area with hydrogen peroxide and maintaining aeration with packing the area under the breast with gauze.

## 2012-08-16 ENCOUNTER — Telehealth: Payer: Self-pay | Admitting: Internal Medicine

## 2012-08-16 ENCOUNTER — Other Ambulatory Visit: Payer: Self-pay | Admitting: Internal Medicine

## 2012-08-16 NOTE — Telephone Encounter (Signed)
Rx's called in . 

## 2012-08-16 NOTE — Telephone Encounter (Signed)
Patient called in and needs her metformin 1000 mg, and glipizide 10 mg she uses [facility] in [place] at [address].  Patient took her last pill today.

## 2012-09-02 ENCOUNTER — Other Ambulatory Visit (INDEPENDENT_AMBULATORY_CARE_PROVIDER_SITE_OTHER): Payer: BC Managed Care – PPO

## 2012-09-02 DIAGNOSIS — E119 Type 2 diabetes mellitus without complications: Secondary | ICD-10-CM

## 2012-09-07 ENCOUNTER — Telehealth: Payer: Self-pay | Admitting: Internal Medicine

## 2012-09-07 DIAGNOSIS — E1165 Type 2 diabetes mellitus with hyperglycemia: Secondary | ICD-10-CM

## 2012-09-07 NOTE — Telephone Encounter (Signed)
Your diabetes means wildly out of control despite my best efforts to get it in line. And therefore referring her to Dr. Renae Fickle for management. She's an endocrinologist with [facility] clinic

## 2012-09-13 NOTE — Telephone Encounter (Signed)
Spoke to patient and informed her of the referral for her diabetes.

## 2012-11-09 ENCOUNTER — Telehealth: Payer: Self-pay | Admitting: Internal Medicine

## 2012-11-15 ENCOUNTER — Other Ambulatory Visit: Payer: Self-pay

## 2012-11-15 ENCOUNTER — Ambulatory Visit (INDEPENDENT_AMBULATORY_CARE_PROVIDER_SITE_OTHER): Payer: BC Managed Care – PPO | Admitting: Internal Medicine

## 2012-11-15 ENCOUNTER — Encounter: Payer: Self-pay | Admitting: Internal Medicine

## 2012-11-15 VITALS — BP 122/64 | HR 79 | Temp 98.1°F | Resp 12 | Ht 63.0 in | Wt 180.8 lb

## 2012-11-15 DIAGNOSIS — IMO0001 Reserved for inherently not codable concepts without codable children: Secondary | ICD-10-CM

## 2012-11-15 DIAGNOSIS — L732 Hidradenitis suppurativa: Secondary | ICD-10-CM

## 2012-11-15 DIAGNOSIS — Z9119 Patient's noncompliance with other medical treatment and regimen: Secondary | ICD-10-CM

## 2012-11-15 DIAGNOSIS — Z9114 Patient's other noncompliance with medication regimen: Secondary | ICD-10-CM | POA: Insufficient documentation

## 2012-11-15 DIAGNOSIS — E119 Type 2 diabetes mellitus without complications: Secondary | ICD-10-CM

## 2012-11-15 DIAGNOSIS — F1010 Alcohol abuse, uncomplicated: Secondary | ICD-10-CM

## 2012-11-15 DIAGNOSIS — Z9111 Patient's noncompliance with dietary regimen: Secondary | ICD-10-CM

## 2012-11-15 DIAGNOSIS — I10 Essential (primary) hypertension: Secondary | ICD-10-CM

## 2012-11-15 DIAGNOSIS — Z91199 Patient's noncompliance with other medical treatment and regimen due to unspecified reason: Secondary | ICD-10-CM

## 2012-11-15 DIAGNOSIS — Z1331 Encounter for screening for depression: Secondary | ICD-10-CM

## 2012-11-15 MED ORDER — LANSOPRAZOLE 30 MG PO CPDR: 30.0000 mg | DELAYED_RELEASE_CAPSULE | Freq: Every day | ORAL | Status: DC

## 2012-11-15 MED ORDER — SULFAMETHOXAZOLE-TRIMETHOPRIM 800-160 MG PO TABS: 1.0000 | ORAL_TABLET | Freq: Two times a day (BID) | ORAL | Status: DC

## 2012-11-15 MED ORDER — FLUCONAZOLE 150 MG PO TABS: 150.0000 mg | ORAL_TABLET | Freq: Every day | ORAL | Status: DC

## 2012-11-15 MED ORDER — INSULIN LISPRO PROT & LISPRO (75-25 MIX) 100 UNIT/ML ~~LOC~~ SUSP: SUBCUTANEOUS | Status: DC

## 2012-11-15 NOTE — Patient Instructions (Addendum)
You need 25 to 35 grams of fiber in your diet or you can take miralax, metamucil,  Or benefiber daily to prevent constipation  Please increase your insulin doses by 5 units every 3 days until your blood sugars are in the 80 to 150 range   I am referring you to an endocrinologist (diabetes specialist) to help get your diabetes under control  If the infection under your breast/arm gets worse, you will need to see a surgeon to have them drained so call me   Return in mid January for fasting labs

## 2012-11-15 NOTE — Progress Notes (Signed)
Patient ID: [name], female   DOB: February 26, 1961, 51 y.o.   MRN: [id number]  Patient Active Problem List  Diagnosis  . FIBROIDS, UTERUS  . Diabetes mellitus type 2, uncontrolled, without complications  . DYSLIPIDEMIA  . ALCOHOL ABUSE, EPISODIC  . HYPERTENSION  . GERD  . ENDOMETRIAL POLYP  . HIDRADENITIS SUPPURATIVA  . LOW BACK PAIN  . Screening for breast cancer  . Screening for colon cancer  . Arthritis  . Tenosynovitis of thumb  . Noncompliance with diet and medication regimen    Subjective:  CC:   Chief Complaint  Patient presents with  . Cyst    underarm    HPI:   Wendy C Hoskinsis a 51 y.o. female who presents 3 month followup on chronic issues including uncontrolled diabetes mellitus, hyperlipidemia, and hydradenitis suppurativa.  After her last A1c she was [facility] uncontrolled DM hgba1c of 10.3 in October.  she has not had her appointment nor her she heard anything from our office regarding this appointment. She admits to dietary and medication noncompliance. She does not check her blood sugars regularly. She often forgets to take her insulin. She does not exercise regularly. 2) She has a painful draining cyst under right breast and another one in the right axilla.  They have been present for about a week. She has been packing the area under breasts with gauze. She denies fevers chills breast tenderness and nausea.   Past Medical History  Diagnosis Date  . Hyperlipidemia   . Diabetes mellitus   . Gastritis with bleeding due to alcohol 2010    [facility] admission  . Campylobacter enteritis May 2012    [facility] admission  . Hypertension   . Arthritis     Past Surgical History  Procedure Date  . Endometrial biopsy 2004    benign         The following portions of the patient's history were reviewed and updated as appropriate: Allergies, current medications, and problem list.    Review of Systems:   12 Pt  review of systems was  negative except those addressed in the HPI,     History   Social History  . Marital Status: Single    Spouse Name: N/A    Number of Children: N/A  . Years of Education: N/A   Occupational History  . Not on file.   Social History Main Topics  . Smoking status: Former Games developer  . Smokeless tobacco: Never Used  . Alcohol Use: No  . Drug Use: No  . Sexually Active: Not on file   Other Topics Concern  . Not on file   Social History Narrative  . No narrative on file    Objective:  BP 122/64  Pulse 79  Temp 98.1 F (36.7 C) (Oral)  Resp 12  Ht 5\' 3"  (1.6 m)  Wt 180 lb 12 oz (81.988 kg)  BMI 32.02 kg/m2  SpO2 97%  General appearance: alert, cooperative and appears stated age Ears: normal TM's and external ear canals both ears Throat: lips, mucosa, and tongue normal; teeth and gums normal Neck: no adenopathy, no carotid bruit, supple, symmetrical, trachea midline and thyroid not enlarged, symmetric, no tenderness/mass/nodules Back: symmetric, no curvature. ROM normal. No CVA tenderness. Lungs: clear to auscultation bilaterally Heart: regular rate and rhythm, S1, S2 normal, no murmur, click, rub or gallop Abdomen: soft, non-tender; bowel sounds normal; no masses,  no organomegaly Pulses: 2+ and symmetric Skin: draining furuncel under wirght breast,  Another in right axilla.  No erythema.  Lymph nodes: Cervical, supraclavicular, and axillary nodes normal.  Assessment and Plan:  HIDRADENITIS SUPPURATIVA With recurrent furuncles currently involving the Right axilla and right breast . Both are spontaneously draining so she does not need incision and drainage currently. Will prescribe Septra Ds x 1 week,  Noncompliance with diet and medication regimen Still drinking,  Not chekcing sugars following low carb diet or using insulin regularly.,   Diabetes mellitus type 2, uncontrolled, without complications Secondary to medication and dietary noncompliance. Referral to  [facility] for assistance in managing her disease. In struggled for 2 years due to in part to this patient the need for compliance and no long-term complications of diabetes. Her daughter has accompanied her today and states that patient has been skipping medications and not following her diet as recommended.  Continue statin therapy. Aspirin has not been prescribed given her history of bleeding ulcer and noncompliance of alcohol abstinence.  ALCOHOL ABUSE, EPISODIC With history of gastric bleeding ulcer several years ago due to alcohol abuse. She admits to occasional use of alcohol. Continue use of Nexium daily.   HYPERTENSION well controlled on current medications. No changes today. Renal function is stable.    Updated Medication List Outpatient Encounter Prescriptions as of 11/15/2012  Medication Sig Dispense Refill  . glipiZIDE (GLUCOTROL) 10 MG tablet TAKE ONE TABLET BY MOUTH TWICE DAILY BEFORE MEALS  60 tablet  10  . insulin lispro protamine-insulin lispro (HUMALOG MIX 75/25) (75-25) 100 UNIT/ML SUSP 35 units in the morning, 55 units in the evening before meals. INCREASE BY 5 UNITS EVERY 3 DAYS UNTIL SUGARS ARE AT GOAL  30 mL  12  . lansoprazole (PREVACID) 30 MG capsule Take 1 capsule (30 mg total) by mouth daily.  30 capsule  11  . medroxyPROGESTERone (DEPO-PROVERA) 150 MG/ML injection Inject 150 mg into the muscle every 3 (three) months.        . metFORMIN (GLUCOPHAGE) 1000 MG tablet TAKE ONE TABLET BY MOUTH TWICE DAILY WITH MEALS  60 tablet  10  . simvastatin (ZOCOR) 40 MG tablet Take 40 mg by mouth every evening.      . [DISCONTINUED] esomeprazole (NEXIUM) 40 MG capsule Take 40 mg by mouth daily.      . [DISCONTINUED] Insulin Lispro Prot & Lispro (HUMALOG MIX 75/25 [place]) Inject 35-55 Units into the skin 2 (two) times daily. 35 units in am and 55 units in pm.      . fluconazole (DIFLUCAN) 150 MG tablet Take 1 tablet (150 mg total) by mouth daily.  2 tablet  0  .  sulfamethoxazole-trimethoprim (SEPTRA DS) 800-160 MG per tablet Take 1 tablet by mouth 2 (two) times daily.  14 tablet  0     Orders Placed This Encounter  Procedures  . HM MAMMOGRAPHY  . HM MAMMOGRAPHY  . Lipid panel  . Microalbumin / creatinine urine ratio  . Comprehensive metabolic panel  . Hemoglobin A1c  . HM PAP SMEAR  . HM DIABETES FOOT EXAM    No Follow-up on file.

## 2012-11-15 NOTE — Assessment & Plan Note (Signed)
Still drinking,  Not chekcing sugars following low carb diet or using insulin regularly.,

## 2012-11-15 NOTE — Assessment & Plan Note (Addendum)
With recurrent furuncles currently involving the Right axilla and right breast . Both are spontaneously draining so she does not need incision and drainage currently. Will prescribe Septra Ds x 1 week,

## 2012-11-16 ENCOUNTER — Encounter: Payer: Self-pay | Admitting: Internal Medicine

## 2012-11-16 LAB — HM DIABETES FOOT EXAM: HM Diabetic Foot Exam: NORMAL

## 2012-11-16 NOTE — Assessment & Plan Note (Signed)
With history of gastric bleeding ulcer several years ago due to alcohol abuse. She admits to occasional use of alcohol. Continue use of Nexium daily.

## 2012-11-16 NOTE — Assessment & Plan Note (Addendum)
Secondary to medication and dietary noncompliance. Referral to [facility] for assistance in managing her disease. In struggled for 2 years due to in part to this patient the need for compliance and no long-term complications of diabetes. Her daughter has accompanied her today and states that patient has been skipping medications and not following her diet as recommended.  Continue statin therapy. Aspirin has not been prescribed given her history of bleeding ulcer and noncompliance of alcohol abstinence.

## 2012-11-16 NOTE — Assessment & Plan Note (Signed)
well controlled on current medications. No changes today. Renal function is stable.

## 2013-01-30 ENCOUNTER — Ambulatory Visit: Payer: BC Managed Care – PPO | Admitting: Internal Medicine

## 2013-01-31 ENCOUNTER — Ambulatory Visit (INDEPENDENT_AMBULATORY_CARE_PROVIDER_SITE_OTHER): Payer: BC Managed Care – PPO | Admitting: Internal Medicine

## 2013-01-31 ENCOUNTER — Encounter: Payer: Self-pay | Admitting: Internal Medicine

## 2013-01-31 VITALS — BP 138/91 | HR 86 | Temp 98.0°F | Ht 63.0 in | Wt 182.0 lb

## 2013-01-31 DIAGNOSIS — IMO0001 Reserved for inherently not codable concepts without codable children: Secondary | ICD-10-CM

## 2013-01-31 DIAGNOSIS — E1059 Type 1 diabetes mellitus with other circulatory complications: Secondary | ICD-10-CM

## 2013-01-31 DIAGNOSIS — E785 Hyperlipidemia, unspecified: Secondary | ICD-10-CM

## 2013-01-31 DIAGNOSIS — E119 Type 2 diabetes mellitus without complications: Secondary | ICD-10-CM

## 2013-01-31 DIAGNOSIS — K219 Gastro-esophageal reflux disease without esophagitis: Secondary | ICD-10-CM

## 2013-01-31 MED ORDER — ATORVASTATIN CALCIUM 40 MG PO TABS: 40.0000 mg | ORAL_TABLET | Freq: Every day | ORAL | Status: DC

## 2013-01-31 MED ORDER — FLUCONAZOLE 150 MG PO TABS: 150.0000 mg | ORAL_TABLET | Freq: Every day | ORAL | Status: DC

## 2013-01-31 MED ORDER — INSULIN LISPRO PROT & LISPRO (75-25 MIX) 100 UNIT/ML ~~LOC~~ SUSP: SUBCUTANEOUS | Status: DC

## 2013-01-31 NOTE — Assessment & Plan Note (Addendum)
Simvastatin suspended given current use of antibiotic .  Will change to atorvastatin with resumption after abx therapy.

## 2013-01-31 NOTE — Assessment & Plan Note (Signed)
With history of GI bleed in 2010,  continue nexium

## 2013-01-31 NOTE — Assessment & Plan Note (Addendum)
Patient was referred to Endocrinology in October for uncontrolled diabetes. hba1c 10.5,  but did not go.  She remains noncompliant with monitoring of blood sugars, and has been asked to submit them biweekly via Mychart. A1c is unchanged.

## 2013-01-31 NOTE — Patient Instructions (Addendum)
Send me your blood sugars every two weeks using Mychart so I can adjust yoru insulin   Stop the simvastatin immediately   Labs today (nonfasting)

## 2013-01-31 NOTE — Progress Notes (Signed)
Patient ID: [name], female   DOB: Oct 24, 1961, 52 y.o.   MRN: [id number]   Patient Active Problem List  Diagnosis  . FIBROIDS, UTERUS  . Diabetes mellitus type 2, uncontrolled, without complications  . DYSLIPIDEMIA  . ALCOHOL ABUSE, EPISODIC  . HYPERTENSION  . GERD  . ENDOMETRIAL POLYP  . HIDRADENITIS SUPPURATIVA  . LOW BACK PAIN  . Screening for breast cancer  . Screening for colon cancer  . Arthritis  . Tenosynovitis of thumb  . Noncompliance with diet and medication regimen    Subjective:  CC:   Chief Complaint  Patient presents with  . Rash    on arms, legs and face  . Itching    HPI:   Wendy Stryker Hoskinsis a 52 y.o. female who presents for diabetes follow up.  She developed an Itchy rash started after taking pcn  prescribed by dentist on  Feb 25th for a tooth abscess.  ,  She started pills on 27th but stopped the PCN on Sunday (two days ago).   an alternative has been sent  But pharmacist has warned her of an interaction with simvastatin .   Regarding DM:    Sugars are running high bc of the infection usuall 250 to 350.  She is not adjusting her insulin as directed or checking her regularly .  She is taking 35 units in the  am and 50 units in  the evening    Past Medical History  Diagnosis Date  . Hyperlipidemia   . Diabetes mellitus   . Gastritis with bleeding due to alcohol 2010    [facility] admission  . Campylobacter enteritis May 2012    [facility] admission  . Hypertension   . Arthritis     Past Surgical History  Procedure Laterality Date  . Endometrial biopsy  2004    benign       The following portions of the patient's history were reviewed and updated as appropriate: Allergies, current medications, and problem list.    Review of Systems:   Patient denies headache, fevers, malaise, unintentional weight loss, skin rash, eye pain, sinus congestion and sinus pain, sore throat, dysphagia,  hemoptysis , cough, dyspnea, wheezing, chest pain,  palpitations, orthopnea, edema, abdominal pain, nausea, melena, diarrhea, constipation, flank pain, dysuria, hematuria, urinary  Frequency, nocturia, numbness, tingling, seizures,  Focal weakness, Loss of consciousness,  Tremor, insomnia, depression, anxiety, and suicidal ideation.     History   Social History  . Marital Status: Single    Spouse Name: N/A    Number of Children: N/A  . Years of Education: N/A   Occupational History  . Not on file.   Social History Main Topics  . Smoking status: Former Games developer  . Smokeless tobacco: Never Used  . Alcohol Use: No  . Drug Use: No  . Sexually Active: Not on file   Other Topics Concern  . Not on file   Social History Narrative  . No narrative on file    Objective:  BP 138/91  Pulse 86  Temp(Src) 98 F (36.7 C) (Oral)  Ht 5\' 3"  (1.6 m)  Wt 182 lb (82.555 kg)  BMI 32.25 kg/m2  SpO2 96%  General appearance: alert, cooperative and appears stated age Ears: normal TM's and external ear canals both ears Throat: lips, mucosa, and tongue normal; teeth and gums normal Neck: no adenopathy, no carotid bruit, supple, symmetrical, trachea midline and thyroid not enlarged, symmetric, no tenderness/mass/nodules Back: symmetric, no curvature. ROM normal. No  CVA tenderness. Lungs: clear to auscultation bilaterally Heart: regular rate and rhythm, S1, S2 normal, no murmur, click, rub or gallop Abdomen: soft, non-tender; bowel sounds normal; no masses,  no organomegaly Pulses: 2+ and symmetric Skin: Skin color, texture, turgor normal. No rashes or lesions Lymph nodes: Cervical, supraclavicular, and axillary nodes normal.  Assessment and Plan:  Diabetes mellitus type 2, uncontrolled, without complications Patient was referred to Endocrinology in October for uncontrolled diabetes. hba1c 10.5,  but did not go.  She remains noncompliant with monitoring of blood sugars, and has been asked to submit them biweekly via Mychart. A1c is unchanged.    DYSLIPIDEMIA Simvastatin suspended given current use of antibiotic .  Will change to atorvastatin with resumption after abx therapy.   GERD With history of GI bleed in 2010,  continue nexium     A total of 25  minutes of face to face time was spent with patient more than half of which was spent in counselling and coordination of care   Updated Medication List Outpatient Encounter Prescriptions as of 01/31/2013  Medication Sig Dispense Refill  . glipiZIDE (GLUCOTROL) 10 MG tablet TAKE ONE TABLET BY MOUTH TWICE DAILY BEFORE MEALS  60 tablet  10  . insulin lispro protamine-insulin lispro (HUMALOG MIX 75/25) (75-25) 100 UNIT/ML SUSP 45 units in the morning, 65 units in the evening before meals. INCREASE BY 5 UNITS EVERY 3 DAYS UNTIL SUGARS ARE AT GOAL  30 mL  12  . medroxyPROGESTERone (DEPO-PROVERA) 150 MG/ML injection Inject 150 mg into the muscle every 3 (three) months.        . metFORMIN (GLUCOPHAGE) 1000 MG tablet TAKE ONE TABLET BY MOUTH TWICE DAILY WITH MEALS  60 tablet  10  . [DISCONTINUED] insulin lispro protamine-insulin lispro (HUMALOG MIX 75/25) (75-25) 100 UNIT/ML SUSP 35 units in the morning, 55 units in the evening before meals. INCREASE BY 5 UNITS EVERY 3 DAYS UNTIL SUGARS ARE AT GOAL  30 mL  12  . [DISCONTINUED] simvastatin (ZOCOR) 40 MG tablet Take 40 mg by mouth every evening.      Melene Muller ON 02/14/2013] atorvastatin (LIPITOR) 40 MG tablet Take 1 tablet (40 mg total) by mouth daily.  90 tablet  3  . fluconazole (DIFLUCAN) 150 MG tablet Take 1 tablet (150 mg total) by mouth daily.  2 tablet  0  . lansoprazole (PREVACID) 30 MG capsule Take 1 capsule (30 mg total) by mouth daily.  30 capsule  11  . [DISCONTINUED] fluconazole (DIFLUCAN) 150 MG tablet Take 1 tablet (150 mg total) by mouth daily.  2 tablet  0  . [DISCONTINUED] sulfamethoxazole-trimethoprim (SEPTRA DS) 800-160 MG per tablet Take 1 tablet by mouth 2 (two) times daily.  14 tablet  0   No facility-administered encounter  medications on file as of 01/31/2013.     Orders Placed This Encounter  Procedures  . Microalbumin / creatinine urine ratio  . Comprehensive metabolic panel  . Hemoglobin A1c    No Follow-up on file.

## 2013-02-01 LAB — COMPREHENSIVE METABOLIC PANEL
Albumin: 3.9 g/dL (ref 3.5–5.2)
Alkaline Phosphatase: 88 U/L (ref 39–117)
BUN: 11 mg/dL (ref 6–23)
GFR: 122.61 mL/min (ref >60.00)
Glucose, Bld: 299 mg/dL — ABNORMAL HIGH (ref 70–99)
Total Bilirubin: 0.3 mg/dL (ref 0.3–1.2)

## 2013-02-01 LAB — MICROALBUMIN / CREATININE URINE RATIO: Microalb, Ur: 0.7 mg/dL (ref 0.0–1.9)

## 2013-02-01 LAB — HEMOGLOBIN A1C: Hgb A1c MFr Bld: 10.3 % — ABNORMAL HIGH (ref 4.6–6.5)

## 2013-02-28 ENCOUNTER — Telehealth: Payer: Self-pay | Admitting: Internal Medicine

## 2013-02-28 NOTE — Telephone Encounter (Signed)
Patient was informed the only [facility] I know [facility] [address] is [facility] [place], and I have provided her that number.

## 2013-02-28 NOTE — Telephone Encounter (Signed)
Pt called wanting to get the name and phone number to the foot dr at [address]

## 2013-03-14 ENCOUNTER — Ambulatory Visit (INDEPENDENT_AMBULATORY_CARE_PROVIDER_SITE_OTHER): Payer: BC Managed Care – PPO | Admitting: Internal Medicine

## 2013-03-14 ENCOUNTER — Encounter: Payer: Self-pay | Admitting: Internal Medicine

## 2013-03-14 VITALS — BP 122/78 | HR 87 | Temp 98.1°F | Resp 16 | Wt 182.2 lb

## 2013-03-14 DIAGNOSIS — M545 Low back pain, unspecified: Secondary | ICD-10-CM

## 2013-03-14 DIAGNOSIS — Z9114 Patient's other noncompliance with medication regimen: Secondary | ICD-10-CM

## 2013-03-14 DIAGNOSIS — E785 Hyperlipidemia, unspecified: Secondary | ICD-10-CM

## 2013-03-14 DIAGNOSIS — Z9111 Patient's noncompliance with dietary regimen: Secondary | ICD-10-CM

## 2013-03-14 DIAGNOSIS — Z91148 Patient's other noncompliance with medication regimen for other reason: Secondary | ICD-10-CM

## 2013-03-14 DIAGNOSIS — IMO0001 Reserved for inherently not codable concepts without codable children: Secondary | ICD-10-CM

## 2013-03-14 DIAGNOSIS — Z9119 Patient's noncompliance with other medical treatment and regimen: Secondary | ICD-10-CM

## 2013-03-14 MED ORDER — ATORVASTATIN CALCIUM 40 MG PO TABS: 40.0000 mg | ORAL_TABLET | Freq: Every day | ORAL | Status: DC

## 2013-03-14 MED ORDER — OMEPRAZOLE 40 MG PO CPDR: 40.0000 mg | DELAYED_RELEASE_CAPSULE | Freq: Every day | ORAL | Status: DC

## 2013-03-14 NOTE — Patient Instructions (Addendum)
Take your evening insulin right before you eat .  start with 65 units.   If your morning sugar is > 200 for  3 days in a row,  Increase the evening insulin dose to 68 units   Instead of skipping breakfast,  Eat an Atkins bar ([employer]) and take your insuln before you eat it or with it  Decrease the morning dose to 40 units   Get your daughter to sing you up for Mychart so she can e mail me your blood sugars going forward   Start the lipitor (atorvastatin) at time for high  cholesterol

## 2013-03-14 NOTE — Progress Notes (Signed)
Patient ID: [name], female   DOB: May 14, 1961, 52 y.o.   MRN: [id number]   Patient Active Problem List  Diagnosis  . FIBROIDS, UTERUS  . Diabetes mellitus type 2, uncontrolled, without complications  . DYSLIPIDEMIA  . ALCOHOL ABUSE, EPISODIC  . HYPERTENSION  . GERD  . ENDOMETRIAL POLYP  . HIDRADENITIS SUPPURATIVA  . LOW BACK PAIN  . Screening for breast cancer  . Screening for colon cancer  . Arthritis  . Tenosynovitis of thumb  . Noncompliance with diet and medication regimen    Subjective:  CC:   Chief Complaint  Patient presents with  . Follow-up    HPI:   Wendy Parrott Hoskinsis a 52 y.o. female who presents for one month follow up on uncontrolled diabetes mellitus.  Her last A1c was 10 and she was asked to return with log of blod suagres so her insulin doses could be titrated. She has been cheking her fasting sugars at  4 am .  sugars have been 200 to 300 for the last week   She is taking 65 units at night after her evening meal, not before. Her afternoon sugars have occasionally been low secondary to skipping meals. She does not follow a low glycemic index diet and does not exercise regularly.    Past Medical History  Diagnosis Date  . Hyperlipidemia   . Diabetes mellitus   . Gastritis with bleeding due to alcohol 2010    [facility] admission  . Campylobacter enteritis May 2012    [facility] admission  . Hypertension   . Arthritis     Past Surgical History  Procedure Laterality Date  . Endometrial biopsy  2004    benign       The following portions of the patient's history were reviewed and updated as appropriate: Allergies, current medications, and problem list.    Review of Systems:  Patient denies headache, fevers, malaise, unintentional weight loss, skin rash, eye pain, sinus congestion and sinus pain, sore throat, dysphagia,  hemoptysis , cough, dyspnea, wheezing, chest pain, palpitations, orthopnea, edema, abdominal pain, nausea, melena, diarrhea,  constipation, flank pain, dysuria, hematuria, urinary  Frequency, nocturia, numbness, tingling, seizures,  Focal weakness, Loss of consciousness,  Tremor, insomnia, depression, anxiety, and suicidal ideation.     History   Social History  . Marital Status: Single    Spouse Name: N/A    Number of Children: N/A  . Years of Education: N/A   Occupational History  . Not on file.   Social History Main Topics  . Smoking status: Former Games developer  . Smokeless tobacco: Never Used  . Alcohol Use: No  . Drug Use: No  . Sexually Active: Not on file   Other Topics Concern  . Not on file   Social History Narrative  . No narrative on file    Objective:  BP 122/78  Pulse 87  Temp(Src) 98.1 F (36.7 C) (Oral)  Resp 16  Wt 182 lb 4 oz (82.668 kg)  BMI 32.29 kg/m2  SpO2 98%  General appearance: alert, cooperative and appears stated age Back: symmetric, no curvature. ROM normal. No CVA tenderness. Lungs: clear to auscultation bilaterally Heart: regular rate and rhythm, S1, S2 normal, no murmur, click, rub or gallop Abdomen: soft, non-tender; bowel sounds normal; no masses,  no organomegaly Pulses: 2+ and symmetric Skin: Skin color, texture, turgor normal. No rashes or lesions   Assessment and Plan:  Diabetes mellitus type 2, uncontrolled, without complications A1c now 10.0  She has  deferred endocrinology referrals repeatedly.  Patient did  bring a blood sugar log as requested.  She has been checking sugars once or twice daily in the mornign and in late afternoon..  She skips breakfast often , due to morning anorexia, which has complicated attempts to achieve tighter glycemic control.  She has been taking her evening insulin after her dinner, incorrectly, which was again reviewed and clarified.  Dose change to 68 units given persistently high fasting sugars and suggested a trial of using  Atkins bars in the morning instead of skipping breakfast .  I have again recommended titration of  insulin on a weekly basis, a low glycemic index diet.and regular exercise.    I have suggested that she enlist her daughter's help to set up MyChart to use for a way to provide me with the raw data I need to adjust her insulin periodically.  Noncompliance with diet and medication regimen She has not been taking the statin that was prescribed and has not been following a low glycemic index diet or exercising regularly.  Encouragement to adhere to healthy lifestyle given.  Reviewed the importance of regular exercise and daily use of medications to manage her multiple risk factors for CAD .  She has a history of episodic alcohol abuse with prior hospitalizatio nfor GI bleed secondary to gastritis in 2010.  She reports alcohol abstinence.   LOW BACK PAIN Her persistent low back pain and leg pain is under investigation by Orthopedics with an MRI of lumbar spine which has been scheduled for the very near future at [facility].   A total of 25 minutes was spent with patient more than half of which was spent in counseling, reviewing records from other prviders and coordination of care.  Updated Medication List Outpatient Encounter Prescriptions as of 03/14/2013  Medication Sig Dispense Refill  . glipiZIDE (GLUCOTROL) 10 MG tablet TAKE ONE TABLET BY MOUTH TWICE DAILY BEFORE MEALS  60 tablet  10  . HYDROcodone-acetaminophen (NORCO) 10-325 MG per tablet       . insulin lispro protamine-insulin lispro (HUMALOG MIX 75/25) (75-25) 100 UNIT/ML SUSP 45 units in the morning, 65 units in the evening before meals. INCREASE BY 5 UNITS EVERY 3 DAYS UNTIL SUGARS ARE AT GOAL  30 mL  12  . medroxyPROGESTERone (DEPO-PROVERA) 150 MG/ML injection Inject 150 mg into the muscle every 3 (three) months.        . metFORMIN (GLUCOPHAGE) 1000 MG tablet TAKE ONE TABLET BY MOUTH TWICE DAILY WITH MEALS  60 tablet  10  . atorvastatin (LIPITOR) 40 MG tablet Take 1 tablet (40 mg total) by mouth daily.  90 tablet  3  . etodolac (LODINE) 400 MG  tablet       . fluconazole (DIFLUCAN) 150 MG tablet Take 1 tablet (150 mg total) by mouth daily.  2 tablet  0  . lansoprazole (PREVACID) 30 MG capsule Take 1 capsule (30 mg total) by mouth daily.  30 capsule  11  . omeprazole (PRILOSEC) 40 MG capsule Take 1 capsule (40 mg total) by mouth daily.  30 capsule  3  . [DISCONTINUED] atorvastatin (LIPITOR) 40 MG tablet Take 1 tablet (40 mg total) by mouth daily.  90 tablet  3   No facility-administered encounter medications on file as of 03/14/2013.     No orders of the defined types were placed in this encounter.    No Follow-up on file.

## 2013-03-16 ENCOUNTER — Encounter: Payer: Self-pay | Admitting: Internal Medicine

## 2013-03-16 DIAGNOSIS — Z9114 Patient's other noncompliance with medication regimen: Secondary | ICD-10-CM | POA: Insufficient documentation

## 2013-03-16 NOTE — Assessment & Plan Note (Addendum)
A1c now 10.0  She has deferred endocrinology referrals repeatedly.  Patient did  bring a blood sugar log as requested.  She has been checking sugars once or twice daily in the mornign and in late afternoon..  She skips breakfast often , due to morning anorexia, which has complicated attempts to achieve tighter glycemic control.  She has been taking her evening insulin after her dinner, incorrectly, which was again reviewed and clarified.  Dose change to 68 units given persistently high fasting sugars and suggested a trial of using  Atkins bars in the morning instead of skipping breakfast .  I have again recommended titration of insulin on a weekly basis, a low glycemic index diet.and regular exercise.    I have suggested that she enlist her daughter's help to set up MyChart to use for a way to provide me with the raw data I need to adjust her insulin periodically.

## 2013-03-16 NOTE — Assessment & Plan Note (Addendum)
She has not been taking the statin that was prescribed and has not been following a low glycemic index diet or exercising regularly.  Encouragement to adhere to healthy lifestyle given.  Reviewed the importance of regular exercise and daily use of medications to manage her multiple risk factors for CAD .  She has a history of episodic alcohol abuse with prior hospitalizatio nfor GI bleed secondary to gastritis in 2010.  She reports alcohol abstinence.

## 2013-03-16 NOTE — Assessment & Plan Note (Signed)
Her persistent low back pain and leg pain is under investigation by Orthopedics with an MRI of lumbar spine which has been scheduled for the very near future at [facility].

## 2013-03-21 ENCOUNTER — Ambulatory Visit: Payer: Self-pay | Admitting: Internal Medicine

## 2013-03-23 ENCOUNTER — Telehealth: Payer: Self-pay | Admitting: *Deleted

## 2013-03-23 NOTE — Telephone Encounter (Signed)
Patient notified of dosage change and states she is feeling much better. Also notified patient she needs to eat some lunch to keep CBG balanced.

## 2013-03-23 NOTE — Telephone Encounter (Signed)
Tell her to reduce the morning insulin dose from 45 units down to 35 units. And make sure she is not skipping breakfast.  She HAS to eat if she is going to use insulin

## 2013-03-23 NOTE — Telephone Encounter (Signed)
Patient states CBG= 56 thirty minutes later but reports no sweats at this time ask patient to recheck CBG and will call back.

## 2013-03-23 NOTE — Telephone Encounter (Signed)
Patient reports sweats, shaking CBG= 149 at 5:50 am and CBG= 49 at this time , patient did not have juice only candy she could eat patient is to check cbg and return call in thirty minutes. Patient also stated the last two morning she has felt this way after taking humalog 70/25 at 45 units please advise if changes needed.

## 2013-04-17 ENCOUNTER — Encounter: Payer: Self-pay | Admitting: Internal Medicine

## 2013-06-19 ENCOUNTER — Encounter: Payer: Self-pay | Admitting: Internal Medicine

## 2013-06-19 ENCOUNTER — Ambulatory Visit (INDEPENDENT_AMBULATORY_CARE_PROVIDER_SITE_OTHER): Payer: BC Managed Care – PPO | Admitting: Internal Medicine

## 2013-06-19 VITALS — BP 128/72 | HR 74 | Temp 98.1°F | Resp 14 | Wt 189.0 lb

## 2013-06-19 DIAGNOSIS — F1311 Sedative, hypnotic or anxiolytic abuse, in remission: Secondary | ICD-10-CM

## 2013-06-19 DIAGNOSIS — M545 Low back pain, unspecified: Secondary | ICD-10-CM

## 2013-06-19 DIAGNOSIS — I10 Essential (primary) hypertension: Secondary | ICD-10-CM

## 2013-06-19 DIAGNOSIS — E785 Hyperlipidemia, unspecified: Secondary | ICD-10-CM

## 2013-06-19 DIAGNOSIS — E119 Type 2 diabetes mellitus without complications: Secondary | ICD-10-CM

## 2013-06-19 DIAGNOSIS — Z87898 Personal history of other specified conditions: Secondary | ICD-10-CM

## 2013-06-19 DIAGNOSIS — K649 Unspecified hemorrhoids: Secondary | ICD-10-CM

## 2013-06-19 DIAGNOSIS — IMO0001 Reserved for inherently not codable concepts without codable children: Secondary | ICD-10-CM

## 2013-06-19 LAB — HM DIABETES FOOT EXAM: HM Diabetic Foot Exam: NORMAL

## 2013-06-19 MED ORDER — HYDROCORTISONE ACETATE 25 MG RE SUPP: 25.0000 mg | Freq: Two times a day (BID) | RECTAL | Status: DC

## 2013-06-19 MED ORDER — HYDROCODONE-ACETAMINOPHEN 10-325 MG PO TABS: 1.0000 | ORAL_TABLET | Freq: Three times a day (TID) | ORAL | Status: DC | PRN

## 2013-06-19 NOTE — Patient Instructions (Addendum)
We will refer you to [facility] endocrinologist if your a1c is still 10.0 or higher  I recommend delaying your back surgery until your diabetes is under better control  You need to reduce the carbohydrates in your diet and go for a walk after your biggest meal   We will notify you of your labs soon

## 2013-06-19 NOTE — Progress Notes (Signed)
Patient ID: [name], female   DOB: March 14, 1961, 52 y.o.   MRN: [id number]  Patient Active Problem List   Diagnosis Date Noted  . Noncompliance with diet and medication regimen 11/15/2012  . Tenosynovitis of thumb 01/02/2012  . Arthritis   . Screening for breast cancer 08/04/2011  . Screening for colon cancer 08/04/2011  . HIDRADENITIS SUPPURATIVA 04/11/2007  . GERD 11/17/2006  . ENDOMETRIAL POLYP 11/17/2006  . LOW BACK PAIN 11/17/2006  . FIBROIDS, UTERUS 11/16/2006  . Diabetes mellitus type 2, uncontrolled, without complications 11/16/2006  . DYSLIPIDEMIA 11/16/2006  . ALCOHOL ABUSE, EPISODIC 11/16/2006  . HYPERTENSION 11/16/2006    Subjective:  CC:   Chief Complaint  Patient presents with  . Follow-up    diabetes    HPI:   Wendy Selman Hoskinsis a 52 y.o. female who presents 3 month follow up on DM.  She reason log of her blood sugars. She has not been following a low glycemic index diet. Her sugars are still uncontrolled with fastings well above 150 and postprandials often above 200 .  She's been using 65 units of insulin before supper and 35 units in the morning. She has had her eyes examined within the last year per Dr. Mitzi Davenport.   Regarding her back pain she has had an evaluation by neurosurgery and has been advised to have surgery on her back but she wants to postpone it until she has a new job .  She is requesting a refill on her pain medicaiton      Past Medical History  Diagnosis Date  . Hyperlipidemia   . Diabetes mellitus   . Gastritis with bleeding due to alcohol 2010    [facility] admission  . Campylobacter enteritis May 2012    [facility] admission  . Hypertension   . Arthritis     Past Surgical History  Procedure Laterality Date  . Endometrial biopsy  2004    benign       The following portions of the patient's history were reviewed and updated as appropriate: Allergies, current medications, and problem list.    Review of Systems:   12 Pt  review  of systems was negative except those addressed in the HPI,     History   Social History  . Marital Status: Single    Spouse Name: N/A    Number of Children: N/A  . Years of Education: N/A   Occupational History  . Not on file.   Social History Main Topics  . Smoking status: Former Games developer  . Smokeless tobacco: Never Used  . Alcohol Use: No  . Drug Use: No  . Sexually Active: Not on file   Other Topics Concern  . Not on file   Social History Narrative  . No narrative on file    Objective:  BP 128/72  Pulse 74  Temp(Src) 98.1 F (36.7 C) (Oral)  Resp 14  Wt 189 lb (85.73 kg)  BMI 33.49 kg/m2  SpO2 99%  General appearance: alert, cooperative and appears stated age Ears: normal TM's and external ear canals both ears Throat: lips, mucosa, and tongue normal; teeth and gums normal Neck: no adenopathy, no carotid bruit, supple, symmetrical, trachea midline and thyroid not enlarged, symmetric, no tenderness/mass/nodules Back: symmetric, no curvature. ROM normal. No CVA tenderness. Lungs: clear to auscultation bilaterally Heart: regular rate and rhythm, S1, S2 normal, no murmur, click, rub or gallop Abdomen: soft, non-tender; bowel sounds normal; no masses,  no organomegaly Pulses: 2+ and symmetric Skin:  Skin color, texture, turgor normal. No rashes or lesions Lymph nodes: Cervical, supraclavicular, and axillary nodes normal. Foot exam:  Nails are well trimmed,  No callouses,  Sensation intact to microfilament   Assessment and Plan:  Diabetes mellitus type 2, uncontrolled, without complications Her A1c has improved to 8.5 but is still well above goal. Attempts to modify her mixed insulin regimen have caused recurrent hypoglycemic events because she is not compliant with diet. I'm referring her to Dr. Lafe Garin for assistance in getting her diabetes under control. She can meet with her in the [place] office as  this is actually more convenient for her. Foot exam was normal  today. She is on appropriate medications. I agree with her in delaying her surgery until her diabetes is better controlled.  DYSLIPIDEMIA She is a high potensity statin and tolerating the medication. LDL is below 100. No changes today. Liver enzymes are normal.  HYPERTENSION Well controlled on current regimen. Renal function stable, no changes today.  LOW BACK PAIN She has apparently been advised by [facility] orthopedics that she needs lumbar surgery. I have been asked for a preoperative evaluation. I do not have a copy of the evaluation or of the MRI. I'm recommending that she have her surgery delayed until her blood sugars are better controlled.  Letter to specialist will be sent today. I will refill her pain medication today.  A total of 40 minutes was spent with patient more than half of which was spent in counseling, reviewing records from other prviders and coordination of care.  Updated Medication List Outpatient Encounter Prescriptions as of 06/19/2013  Medication Sig Dispense Refill  . atorvastatin (LIPITOR) 40 MG tablet Take 1 tablet (40 mg total) by mouth daily.  90 tablet  3  . etodolac (LODINE) 400 MG tablet       . glipiZIDE (GLUCOTROL) 10 MG tablet TAKE ONE TABLET BY MOUTH TWICE DAILY BEFORE MEALS  60 tablet  10  . HYDROcodone-acetaminophen (NORCO) 10-325 MG per tablet Take 1 tablet by mouth every 8 (eight) hours as needed for pain.  60 tablet  1  . insulin lispro protamine-insulin lispro (HUMALOG MIX 75/25) (75-25) 100 UNIT/ML SUSP 45 units in the morning, 65 units in the evening before meals. INCREASE BY 5 UNITS EVERY 3 DAYS UNTIL SUGARS ARE AT GOAL  30 mL  12  . medroxyPROGESTERone (DEPO-PROVERA) 150 MG/ML injection Inject 150 mg into the muscle every 3 (three) months.        . metFORMIN (GLUCOPHAGE) 1000 MG tablet TAKE ONE TABLET BY MOUTH TWICE DAILY WITH MEALS  60 tablet  10  . omeprazole (PRILOSEC) 40 MG capsule Take 1 capsule (40 mg total) by mouth daily.  30 capsule  3   . [DISCONTINUED] HYDROcodone-acetaminophen (NORCO) 10-325 MG per tablet       . [DISCONTINUED] lansoprazole (PREVACID) 30 MG capsule Take 1 capsule (30 mg total) by mouth daily.  30 capsule  11  . fluconazole (DIFLUCAN) 150 MG tablet Take 1 tablet (150 mg total) by mouth daily.  2 tablet  0  . hydrocortisone (ANUSOL-HC) 25 MG suppository Place 1 suppository (25 mg total) rectally 2 (two) times daily.  12 suppository  0   No facility-administered encounter medications on file as of 06/19/2013.     Orders Placed This Encounter  Procedures  . HM MAMMOGRAPHY  . Hemoglobin A1c  . Microalbumin / creatinine urine ratio  . Comprehensive metabolic panel  . Drug Screen, Urine  . Lipid panel  .  Hemoglobin A1c  . Ambulatory referral to Endocrinology  . HM DIABETES FOOT EXAM  . HM DIABETES EYE EXAM    No Follow-up on file.

## 2013-06-20 LAB — LIPID PANEL
Cholesterol: 162 mg/dL (ref 0–200)
LDL Cholesterol: 96 mg/dL (ref 0–99)
VLDL: 26.4 mg/dL (ref 0.0–40.0)

## 2013-06-20 LAB — MICROALBUMIN / CREATININE URINE RATIO
Microalb Creat Ratio: 0.5 mg/g (ref 0.0–30.0)
Microalb, Ur: 0.4 mg/dL (ref 0.0–1.9)

## 2013-06-23 ENCOUNTER — Encounter: Payer: Self-pay | Admitting: *Deleted

## 2013-06-23 ENCOUNTER — Encounter: Payer: Self-pay | Admitting: Emergency Medicine

## 2013-06-23 ENCOUNTER — Encounter: Payer: Self-pay | Admitting: Internal Medicine

## 2013-06-23 NOTE — Assessment & Plan Note (Signed)
Well controlled on current regimen. Renal function stable, no changes today. 

## 2013-06-23 NOTE — Assessment & Plan Note (Signed)
She is a high potensity statin and tolerating the medication. LDL is below 100. No changes today. Liver enzymes are normal.

## 2013-06-23 NOTE — Assessment & Plan Note (Signed)
Her A1c has improved to 8.5 but is still well above goal. Attempts to modify her mixed insulin regimen have caused recurrent hypoglycemic events because she is not compliant with diet. I'm referring her to Dr. Lafe Garin for assistance in getting her diabetes under control. She can meet with her in the [place] office as  this is actually more convenient for her. Foot exam was normal today. She is on appropriate medications. I agree with her in delaying her surgery until her diabetes is better controlled.

## 2013-06-23 NOTE — Assessment & Plan Note (Signed)
She has apparently been advised by [facility] orthopedics that she needs lumbar surgery. I have been asked for a preoperative evaluation. I do not have a copy of the evaluation or of the MRI. I'm recommending that she have her surgery delayed until her blood sugars are better controlled.  Letter to specialist will be sent today. I will refill her pain medication today.

## 2013-07-10 ENCOUNTER — Encounter: Payer: Self-pay | Admitting: Internal Medicine

## 2013-07-10 ENCOUNTER — Ambulatory Visit (INDEPENDENT_AMBULATORY_CARE_PROVIDER_SITE_OTHER): Payer: BC Managed Care – PPO | Admitting: Internal Medicine

## 2013-07-10 VITALS — BP 108/60 | HR 92 | Temp 98.3°F | Resp 12 | Ht 63.0 in | Wt 191.0 lb

## 2013-07-10 MED ORDER — INSULIN NPH ISOPHANE & REGULAR (70-30) 100 UNIT/ML ~~LOC~~ SUSP: SUBCUTANEOUS | Status: DC

## 2013-07-10 MED ORDER — GLUCOSE BLOOD VI STRP: ORAL_STRIP | Status: DC

## 2013-07-10 NOTE — Progress Notes (Signed)
Patient ID: THEDA PAYER, female   DOB: 15-Nov-1961, 52 y.o.   MRN: [id number]  HPI: NESHA COUNIHAN is a 52 y.o.-year-old female, referred by her PCP, Dr.Tullo, for management of DM2, non-insulin-dependent, uncontrolled, with complications (PN, ? DR).  Patient has been diagnosed with diabetes in ~2000  she started on insulin before 2010 Last hemoglobin A1c was: Lab Results  Component Value Date   HGBA1C 8.5* 06/19/2013  Previously 10.3%.  Pt is on a regimen of: - Metformin 1000 mg po bid  - Glipizide 10 mg bid - Humalog 75/25 injects 35 in am (skips b'fast usually)! and 65 at bedtime!  Pt checks her sugars 1-2 a day and they are: - am: 63-274 - before dinner: 76-200 - bedtime: 170-190s Has lows. Lowest sugar was 63; she has hypoglycemia awareness at 80. Highest sugar was 274 - last 2 mo.  Pt's meals are: - Breakfast: mostly skips it - Lunch: 10:30 am: cornbread today - Dinner: "everything" - Snacks: chips, fig balls, etc.  She gets to work at 5 am.  - no CKD, last BUN/creatinine:  Lab Results  Component Value Date   BUN 11 01/31/2013   CREATININE 0.7 01/31/2013  Last ACR 0.5 06/19/13.   - last set of lipids: Lab Results  Component Value Date   CHOL 162 06/19/2013   HDL 39.60 06/19/2013   LDLCALC 96 06/19/2013   LDLDIRECT 89.7 11/20/2011   TRIG 132.0 06/19/2013   CHOLHDL 4 06/19/2013  She is on Lipitor.   - last eye exam was in 2 mo ago. + DR - "I was bleeding behind my eye" - Dr. Mitzi Davenport.  - + numbness and tingling in her feet.  I reviewed her chart and she also has a history of hypertension, hyperlipidemia-on Lipitor, episodic alcohol abuse, GERD, low back pain, osteoarthritis. She is on Depo-Provera.  Pt has FH of DM in parents and sisters and 1 brother.   ROS: Constitutional: no weight gain/loss, no fatigue, no subjective hyperthermia/hypothermia Eyes: + blurry vision, no xerophthalmia ENT: no sore throat, no nodules palpated in throat, no dysphagia/odynophagia, no  hoarseness Cardiovascular: no CP/SOB/palpitations/leg swelling Respiratory: no cough/SOB Gastrointestinal: +N/no V/D/C/+ heartburn Musculoskeletal: + all: muscle/joint aches Skin: no rashes Neurological: no tremors/numbness/tingling/dizziness Psychiatric: no depression/anxiety  Past Medical History  Diagnosis Date  . Hyperlipidemia   . Diabetes mellitus   . Gastritis with bleeding due to alcohol 2010    [facility] admission  . Campylobacter enteritis May 2012    [facility] admission  . Hypertension   . Arthritis    Past Surgical History  Procedure Laterality Date  . Endometrial biopsy  2004    benign   History   Social History  . Marital Status: Single    Spouse Name: N/A    Number of Children: 2   Occupational History  . janitor   Social History Main Topics  . Smoking status: Former Games developer  . Smokeless tobacco: Never Used  . Alcohol Use: No  . Drug Use: No   Current Outpatient Prescriptions on File Prior to Visit  Medication Sig Dispense Refill  . atorvastatin (LIPITOR) 40 MG tablet Take 1 tablet (40 mg total) by mouth daily.  90 tablet  3  . fluconazole (DIFLUCAN) 150 MG tablet Take 1 tablet (150 mg total) by mouth daily.  2 tablet  0  . glipiZIDE (GLUCOTROL) 10 MG tablet TAKE ONE TABLET BY MOUTH TWICE DAILY BEFORE MEALS  60 tablet  10  . HYDROcodone-acetaminophen (NORCO) 10-325 MG per  tablet Take 1 tablet by mouth every 8 (eight) hours as needed for pain.  60 tablet  1  . hydrocortisone (ANUSOL-HC) 25 MG suppository Place 1 suppository (25 mg total) rectally 2 (two) times daily.  12 suppository  0  . medroxyPROGESTERone (DEPO-PROVERA) 150 MG/ML injection Inject 150 mg into the muscle every 3 (three) months.        . metFORMIN (GLUCOPHAGE) 1000 MG tablet TAKE ONE TABLET BY MOUTH TWICE DAILY WITH MEALS  60 tablet  10  . omeprazole (PRILOSEC) 40 MG capsule Take 1 capsule (40 mg total) by mouth daily.  30 capsule  3  . etodolac (LODINE) 400 MG tablet Take 400 mg by mouth daily.        . [DISCONTINUED] esomeprazole (NEXIUM) 40 MG capsule Take 40 mg by mouth daily.       No current facility-administered medications on file prior to visit.   Allergies  Allergen Reactions  . Oxycodone-Acetaminophen     REACTION: Unknown reaction  . Penicillins Rash   Family History  Problem Relation Age of Onset  . Hypertension Mother   . Stroke Father   . Diabetes Maternal Grandmother   . Hypertension Maternal Grandmother    PE: BP 108/60  Pulse 92  Temp(Src) 98.3 F (36.8 C) (Oral)  Resp 12  Ht 5\' 3"  (1.6 m)  Wt 191 lb (86.637 kg)  BMI 33.84 kg/m2  SpO2 99% Wt Readings from Last 3 Encounters:  07/10/13 191 lb (86.637 kg)  06/19/13 189 lb (85.73 kg)  03/14/13 182 lb 4 oz (82.668 kg)   Constitutional: overweight, in NAD Eyes: PERRLA, EOMI, no exophthalmos ENT: moist mucous membranes, no thyromegaly, no cervical lymphadenopathy Cardiovascular: RRR, No MRG Respiratory: CTA B Gastrointestinal: abdomen soft, NT, ND, BS+ Musculoskeletal: no deformities, strength intact in all 4 Skin: moist, warm, no rashes Neurological: no tremor with outstretched hands, DTR normal in all 4  ASSESSMENT: 1. DM2, insulin-dependent, uncontrolled, with complications - peripheral neuropathy - DR - ?  PLAN:  1. Patient with long-standing, recently more controlled diabetes, on a premixed insulin regimen. - We discussed about her current regimen, which I don't think his ideal, since it's very inflexible. I explained the reasons why I considered that this is not a very good regimen: Inflexibility to adjust only the basal or the bolus doses and later peaking of the insulins. I explained that the ideal insulin regimen would contain ~ equal total daily doses of basal or bolus insulins. At this point, she takes75 units of basal insulin and 25 units of bolus (rapid-acting insulin). There is a large imbalance between the total daily doses, which I think is contributing to her  hyperglycemia/hypoglycemia. The higher NPH dosing can contribute to lows during sleep or between meals. She does, indeed, wake up with low blood sugars in the morning, at this might also be attribute it to taking the second dose of 75/25 insulin at bedtime rather than before dinner! I strongly advised her to move this dosing to before dinner. She also takes her insulin in the morning without eating her breakfast, which is, again, dangerous from the point of view of developing lowsduring the day. - I gave her samples of Humulin 70/30 and advice her to inject it 30 minutes before meals - we discussed about the differences between this type of insulin and the Humalog 75/25, that she used before. She tells me that she has been on 70/30 in the past - we discussed about administration of the insulin,  and advised her:   If she does not eat her breakfast, to take the first dose of the day before her 10:30 AM lunch  Continue with the same doses of insulin, but take them before first meal of the day and before last meal of the day  We'll also continue the same dose of metformin and glipizide for now, but I think we can stop the glipizide in the near future - Strongly advised her to start checking her sugars at different times of the day - check 2 times a day, rotating checks - given sugar log and advised how to fill it and to bring it at next appt - check sugars 2-3 times a day - [facility] prescription for relion strips to her pharmacy - given foot care handout and explained the principles  - given instructions for hypoglycemia management "15-15 rule"  - she is up-to-date with eye exams, ACR - Return to clinic in one month with sugar log

## 2013-07-10 NOTE — Addendum Note (Signed)
Addended by: Carlus Pavlov on: 07/10/2013 03:09 PM   Modules accepted: Orders

## 2013-07-10 NOTE — Patient Instructions (Addendum)
Please take insulin 70/30 - Inject 35 units 30 min before breakfast and 65 units 30 min before dinner. Continue Glipizide and Metformin for now.  PATIENT INSTRUCTIONS FOR TYPE 2 DIABETES:  **Please join MyChart!** - see attached instructions about how to join   DIET AND EXERCISE Diet and exercise is an important part of diabetic treatment.  We recommended aerobic exercise in the form of brisk walking (working between 40-60% of maximal aerobic capacity, similar to brisk walking) for 150 minutes per week (such as 30 minutes five days per week) along with 3 times per week performing 'resistance' training (using various gauge rubber tubes with handles) 5-10 exercises involving the major muscle groups (upper body, lower body and core) performing 10-15 repetitions (or near fatigue) each exercise. Start at half the above goal but build slowly to reach the above goals. If limited by weight, joint pain, or disability, we recommend daily walking in a swimming pool with water up to waist to reduce pressure from joints while allow for adequate exercise.    BLOOD GLUCOSES Monitoring your blood glucoses is important for continued management of your diabetes. Please check your blood glucoses 2-4 times a day: fasting, before meals and at bedtime (you can rotate these measurements - e.g. one day check before the 3 meals, the next day check before 2 of the meals and before bedtime, etc.   HYPOGLYCEMIA (low blood sugar) Hypoglycemia is usually a reaction to not eating, exercising, or taking too much insulin/ other diabetes drugs.  Symptoms include tremors, sweating, hunger, confusion, headache, etc. Treat IMMEDIATELY with 15 grams of Carbs:   4 glucose tablets    cup regular juice/soda   2 tablespoons raisins   4 teaspoons sugar   1 tablespoon honey Recheck blood glucose in 15 mins and repeat above if still symptomatic/blood glucose <100. Please contact our office at [phone] if you have questions about  how to next handle your insulin.  RECOMMENDATIONS TO REDUCE YOUR RISK OF DIABETIC COMPLICATIONS: * Take your prescribed MEDICATION(S). * Follow a DIABETIC diet: Complex carbs, fiber rich foods, heart healthy fish twice weekly, (monounsaturated and polyunsaturated) fats * AVOID saturated/trans fats, high fat foods, >2,300 mg salt per day. * EXERCISE at least 5 times a week for 30 minutes or preferably daily.  * DO NOT SMOKE OR DRINK more than 1 drink a day. * Check your FEET every day. Do not wear tightfitting shoes. Contact us if you develop an ulcer * See your EYE doctor once a year or more if needed * Get a FLU shot once a year * Get a PNEUMONIA vaccine once before and once after age 58 years  GOALS:  * Your Hemoglobin A1c of <7%  * fasting sugars need to be <130 * after meals sugars need to be <180 (2h after you start eating) * Your Systolic BP should be 140 or lower  * Your Diastolic BP should be 80 or lower  * Your HDL (Good Cholesterol) should be 40 or higher  * Your LDL (Bad Cholesterol) should be 100 or lower  * Your Triglycerides should be 150 or lower  * Your Urine microalbumin (kidney function) should be <30 * Your Body Mass Index should be 25 or lower   We will be glad to help you achieve these goals. Our telephone number is: [phone].

## 2013-08-08 ENCOUNTER — Ambulatory Visit: Payer: BC Managed Care – PPO | Admitting: Internal Medicine

## 2013-08-11 ENCOUNTER — Other Ambulatory Visit: Payer: Self-pay | Admitting: *Deleted

## 2013-08-11 ENCOUNTER — Telehealth: Payer: Self-pay | Admitting: Internal Medicine

## 2013-08-11 MED ORDER — INSULIN ASPART PROT & ASPART (70-30 MIX) 100 UNIT/ML ~~LOC~~ SUSP: SUBCUTANEOUS | Status: DC

## 2013-08-11 NOTE — Telephone Encounter (Signed)
Wrong rx sent in.

## 2013-08-18 ENCOUNTER — Telehealth: Payer: Self-pay | Admitting: *Deleted

## 2013-08-18 NOTE — Telephone Encounter (Signed)
Received a fax from [facility], Omeprazole Capsule DR has been approved 08.26.2014 through the lifetime of the patients [facility] beneit

## 2013-09-05 ENCOUNTER — Encounter: Payer: Self-pay | Admitting: Internal Medicine

## 2013-09-05 ENCOUNTER — Ambulatory Visit (INDEPENDENT_AMBULATORY_CARE_PROVIDER_SITE_OTHER): Payer: BC Managed Care – PPO | Admitting: Internal Medicine

## 2013-09-05 VITALS — BP 122/80 | HR 91 | Temp 98.3°F | Resp 10 | Wt 188.3 lb

## 2013-09-05 NOTE — Patient Instructions (Addendum)
Please stop the Glipizide. Please take insulin 70/30 - Inject 35 units 30 min before breakfast and 55 units 30 min before dinner.  Continue Metformin for now. Please return in 1 month with your sugar log.

## 2013-09-05 NOTE — Progress Notes (Signed)
Patient ID: [name], female   DOB: 11-Jan-1961, 52 y.o.   MRN: [id number]  HPI: [name] is a 52 y.o.-year-old female, initially referred by her PCP, Dr.Tullo, for management of DM2, dx ~2000, insulin-dependent since 2010, uncontrolled, with complications (PN, ? DR), now returning for f/u for DM2. Last visit 2 mo ago.  Last hemoglobin A1c was: Lab Results  Component Value Date   HGBA1C 8.5* 06/19/2013  Previously 10.3%.  Pt is on a regimen of: - Metformin 1000 mg po bid  - Glipizide 10 mg bid - Humalog 75/25 injects 35 in am (skips b'fast usually) - still takes it 3 h before eating in am! and 65 - still takes it at bedtime despite we discussing about how to take it correctly!  Pt checks her sugars 2x a day and they are: - am: 63-274 >> 52-218, usu. 70-140 - before dinner: 76-200 >> not checking  - bedtime: 170-190s >> sometimes checking, except 1 value 117 Has lows. Lowest sugar was 39 during the night ; she has hypoglycemia awareness at 80. Highest sugar was 274 - last 2 mo.  She gets to work at 5 am.  - no CKD, last BUN/creatinine:  Lab Results  Component Value Date   BUN 11 01/31/2013   CREATININE 0.7 01/31/2013  Last ACR 0.5 06/19/13. Not on ACEI/ARB.  - last set of lipids: Lab Results  Component Value Date   CHOL 162 06/19/2013   HDL 39.60 06/19/2013   LDLCALC 96 06/19/2013   LDLDIRECT 89.7 11/20/2011   TRIG 132.0 06/19/2013   CHOLHDL 4 06/19/2013  She is on Lipitor.   - last eye exam was in 4 mo ago. + DR - "I was bleeding behind my eye" - Dr. Mitzi Davenport.  - + numbness and tingling in her feet.  I reviewed her chart and she also has a history of hypertension, hyperlipidemia-on Lipitor, episodic alcohol abuse, GERD, low back pain, osteoarthritis.   ROS: Constitutional: no weight gain/loss, no fatigue, no subjective hyperthermia/hypothermia Eyes: no blurry vision, no xerophthalmia ENT: no sore throat, no nodules palpated in throat, no dysphagia/odynophagia, no  hoarseness Cardiovascular: no CP/SOB/palpitations/leg swelling Respiratory: no cough/SOB Gastrointestinal: no N/V/D/C Musculoskeletal: no muscle/joint aches Skin: no rashes Neurological: no tremors/numbness/tingling/dizziness  PE: BP 122/80  Pulse 91  Temp(Src) 98.3 F (36.8 C) (Oral)  Resp 10  Wt 188 lb 4.8 oz (85.412 kg)  BMI 33.36 kg/m2  SpO2 97% Wt Readings from Last 3 Encounters:  09/05/13 188 lb 4.8 oz (85.412 kg)  07/10/13 191 lb (86.637 kg)  06/19/13 189 lb (85.73 kg)   Constitutional: overweight, in NAD Eyes: PERRLA, EOMI, no exophthalmos ENT: moist mucous membranes, no thyromegaly, no cervical lymphadenopathy Cardiovascular: RRR, No MRG Respiratory: CTA B Gastrointestinal: abdomen soft, NT, ND, BS+ Musculoskeletal: no deformities, strength intact in all 4 Skin: moist, warm, no rashes Neurological: no tremor with outstretched hands, DTR normal in all 4  ASSESSMENT: 1. DM2, insulin-dependent, uncontrolled, with complications - peripheral neuropathy - DR - ?  PLAN:  1. Patient with long-standing, recently more controlled diabetes, on a premixed insulin regimen. She still has lows as she did not implement the changes that I suggested at last visit. Therefore. I advised her to: Please stop the Glipizide. Please take insulin 70/30 - Inject 35 units 30 min before breakfast and 55 units (decreased from 65) 30 min before dinner.  Continue Metformin for now. Please return in 1 month with your sugar log.  - Strongly advised her to  start checking her sugars at different times of the day - check 2-3 times a day, rotating checks (now only checking in am) - given 3 vials of Novolin 70/30 samples - she is up-to-date with eye exams, ACR - Return to clinic in one month with sugar log

## 2013-09-11 ENCOUNTER — Other Ambulatory Visit: Payer: Self-pay | Admitting: *Deleted

## 2013-09-12 MED ORDER — METFORMIN HCL 1000 MG PO TABS: ORAL_TABLET | ORAL | Status: DC

## 2013-09-12 NOTE — Telephone Encounter (Signed)
Eprescribed.

## 2013-09-19 ENCOUNTER — Encounter: Payer: Self-pay | Admitting: *Deleted

## 2013-09-20 ENCOUNTER — Ambulatory Visit (INDEPENDENT_AMBULATORY_CARE_PROVIDER_SITE_OTHER): Payer: BC Managed Care – PPO | Admitting: Internal Medicine

## 2013-09-20 ENCOUNTER — Encounter: Payer: Self-pay | Admitting: Internal Medicine

## 2013-09-20 VITALS — BP 132/76 | HR 87 | Temp 98.1°F | Resp 14 | Wt 189.2 lb

## 2013-09-20 DIAGNOSIS — R200 Anesthesia of skin: Secondary | ICD-10-CM

## 2013-09-20 DIAGNOSIS — M25539 Pain in unspecified wrist: Secondary | ICD-10-CM

## 2013-09-20 DIAGNOSIS — R209 Unspecified disturbances of skin sensation: Secondary | ICD-10-CM

## 2013-09-20 DIAGNOSIS — I10 Essential (primary) hypertension: Secondary | ICD-10-CM

## 2013-09-20 DIAGNOSIS — L02221 Furuncle of abdominal wall: Secondary | ICD-10-CM

## 2013-09-20 MED ORDER — HYDROCODONE-ACETAMINOPHEN 10-325 MG PO TABS: 1.0000 | ORAL_TABLET | Freq: Three times a day (TID) | ORAL | Status: DC | PRN

## 2013-09-20 MED ORDER — SULFAMETHOXAZOLE-TRIMETHOPRIM 800-160 MG PO TABS: 1.0000 | ORAL_TABLET | Freq: Two times a day (BID) | ORAL | Status: DC

## 2013-09-20 MED ORDER — OMEPRAZOLE 40 MG PO CPDR: 40.0000 mg | DELAYED_RELEASE_CAPSULE | Freq: Every day | ORAL | Status: DC

## 2013-09-20 NOTE — Patient Instructions (Signed)
Referral to Neurology for evaluation of wrist numbness, which may be carpal tunnel syndrome.   No medication changes today

## 2013-09-20 NOTE — Progress Notes (Signed)
Patient ID: [name], female   DOB: 10-04-1961, 52 y.o.   MRN: [id number]   Patient Active Problem List   Diagnosis Date Noted  . Furuncle of abdominal wall 09/21/2013  . Bilateral hand numbness 09/21/2013  . Noncompliance with diet and medication regimen 11/15/2012  . Tenosynovitis of thumb 01/02/2012  . Arthritis   . Screening for breast cancer 08/04/2011  . Screening for colon cancer 08/04/2011  . HIDRADENITIS SUPPURATIVA 04/11/2007  . GERD 11/17/2006  . ENDOMETRIAL POLYP 11/17/2006  . LOW BACK PAIN 11/17/2006  . FIBROIDS, UTERUS 11/16/2006  . Diabetes mellitus type 2, uncontrolled, without complications 11/16/2006  . DYSLIPIDEMIA 11/16/2006  . ALCOHOL ABUSE, EPISODIC 11/16/2006  . HYPERTENSION 11/16/2006    Subjective:  CC:   Chief Complaint  Patient presents with  . Follow-up    HPI:   Wendy Choung Hoskinsis a 52 y.o. female who presents 3 month  follow up.  Referred to Dr Lafe Garin for uncontrolled DM.  Had recurrent symptomatic hypoglycemic episodes, so Dr Lafe Garin stopped the glimepiride.  She is Walking 30 minutes daily , and modifying the diet to restrict her starches .  Having bilateral wrist numbness and pain for the past few months , worse in late afternoon.     Has a resolving furuncle under her pannus on the right.   Past Medical History  Diagnosis Date  . Hyperlipidemia   . Diabetes mellitus   . Gastritis with bleeding due to alcohol 2010    [facility] admission  . Campylobacter enteritis May 2012    [facility] admission  . Hypertension   . Arthritis     Past Surgical History  Procedure Laterality Date  . Endometrial biopsy  2004    benign       The following portions of the patient's history were reviewed and updated as appropriate: Allergies, current medications, and problem list.    Review of Systems:   12 Pt  review of systems was negative except those addressed in the HPI,     History   Social History  . Marital Status: Single    Spouse  Name: N/A    Number of Children: N/A  . Years of Education: N/A   Occupational History  . Not on file.   Social History Main Topics  . Smoking status: Former Games developer  . Smokeless tobacco: Never Used  . Alcohol Use: No  . Drug Use: No  . Sexual Activity: Not on file   Other Topics Concern  . Not on file   Social History Narrative  . No narrative on file    Objective:  Filed Vitals:   09/20/13 1604  BP: 132/76  Pulse: 87  Temp: 98.1 F (36.7 C)  Resp: 14     General appearance: alert, cooperative and appears stated age Ears: normal TM's and external ear canals both ears Throat: lips, mucosa, and tongue normal; teeth and gums normal Neck: no adenopathy, no carotid bruit, supple, symmetrical, trachea midline and thyroid not enlarged, symmetric, no tenderness/mass/nodules Back: symmetric, no curvature. ROM normal. No CVA tenderness. Lungs: clear to auscultation bilaterally Heart: regular rate and rhythm, S1, S2 normal, no murmur, click, rub or gallop Abdomen: nonfluctuant furuncle of abdominal wall, no erythema. Or drainage,  no organomegaly Pulses: 2+ and symmetric Skin: Skin color, texture, turgor normal. No rashes or lesions Lymph nodes: Cervical, supraclavicular, and axillary nodes normal.  Assessment and Plan:  Furuncle of abdominal wall Patient has HS>  Septra DS x 1 week  Bilateral  hand numbness Suspect CTS,  Referral to Neurology for evaluation   Diabetes mellitus type 2, uncontrolled, without complications Improving control with Endocrinology management. Lab Results  Component Value Date   HGBA1C 7.5* 09/20/2013    HYPERTENSION Well controlled on current regimen. Renal function stable, no changes today.  Lab Results  Component Value Date   CREATININE 0.5 09/20/2013      Updated Medication List Outpatient Encounter Prescriptions as of 09/20/2013  Medication Sig Dispense Refill  . atorvastatin (LIPITOR) 40 MG tablet Take 1 tablet (40 mg total)  by mouth daily.  90 tablet  3  . glucose blood (RELION GLUCOSE TEST STRIPS) test strip Use 3x a day  200 each  12  . HYDROcodone-acetaminophen (NORCO) 10-325 MG per tablet Take 1 tablet by mouth every 8 (eight) hours as needed for pain.  60 tablet  1  . hydrocortisone (ANUSOL-HC) 25 MG suppository Place 1 suppository (25 mg total) rectally 2 (two) times daily.  12 suppository  0  . insulin aspart protamine- aspart (NOVOLOG MIX 70/30) (70-30) 100 UNIT/ML injection INJECT 35 UNITS 30 MIN BEFORE BREAKFAST; 65 UNITS 30 MIN BEFORE DINNER  20 mL  12  . insulin NPH-regular (NOVOLIN 70/30) (70-30) 100 UNIT/ML injection Inject 35 units 30 min before breakfast and 65 units 30 min before dinner  20 mL  12  . lansoprazole (PREVACID) 30 MG capsule       . medroxyPROGESTERone (DEPO-PROVERA) 150 MG/ML injection Inject 150 mg into the muscle every 3 (three) months.        . metFORMIN (GLUCOPHAGE) 1000 MG tablet TAKE ONE TABLET BY MOUTH TWICE DAILY WITH MEALS  60 tablet  5  . omeprazole (PRILOSEC) 40 MG capsule Take 1 capsule (40 mg total) by mouth daily.  30 capsule  3  . [DISCONTINUED] HYDROcodone-acetaminophen (NORCO) 10-325 MG per tablet Take 1 tablet by mouth every 8 (eight) hours as needed for pain.  60 tablet  1  . [DISCONTINUED] omeprazole (PRILOSEC) 40 MG capsule Take 1 capsule (40 mg total) by mouth daily.  30 capsule  3  . etodolac (LODINE) 400 MG tablet Take 400 mg by mouth daily.       Marland Kitchen sulfamethoxazole-trimethoprim (SEPTRA DS) 800-160 MG per tablet Take 1 tablet by mouth 2 (two) times daily.  14 tablet  0  . [DISCONTINUED] fluconazole (DIFLUCAN) 150 MG tablet Take 1 tablet (150 mg total) by mouth daily.  2 tablet  0  . [DISCONTINUED] glipiZIDE (GLUCOTROL) 10 MG tablet TAKE ONE TABLET BY MOUTH TWICE DAILY BEFORE MEALS  60 tablet  10   No facility-administered encounter medications on file as of 09/20/2013.     Orders Placed This Encounter  Procedures  . Hemoglobin A1c  . Microalbumin /  creatinine urine ratio  . Comprehensive metabolic panel  . Ambulatory referral to Neurology    No Follow-up on file.

## 2013-09-21 ENCOUNTER — Encounter: Payer: Self-pay | Admitting: Internal Medicine

## 2013-09-21 DIAGNOSIS — G5620 Lesion of ulnar nerve, unspecified upper limb: Secondary | ICD-10-CM | POA: Insufficient documentation

## 2013-09-21 DIAGNOSIS — N611 Abscess of the breast and nipple: Secondary | ICD-10-CM | POA: Insufficient documentation

## 2013-09-21 LAB — COMPREHENSIVE METABOLIC PANEL
ALT: 30 U/L (ref 0–35)
AST: 17 U/L (ref 0–37)
Albumin: 4 g/dL (ref 3.5–5.2)
CO2: 26 mEq/L (ref 19–32)
Calcium: 9.4 mg/dL (ref 8.4–10.5)
Chloride: 104 mEq/L (ref 96–112)
GFR: 161.82 mL/min (ref >60.00)
Potassium: 3.7 mEq/L (ref 3.5–5.1)
Total Protein: 7.6 g/dL (ref 6.0–8.3)

## 2013-09-21 LAB — HEMOGLOBIN A1C: Hgb A1c MFr Bld: 7.5 % — ABNORMAL HIGH (ref 4.6–6.5)

## 2013-09-21 NOTE — Assessment & Plan Note (Signed)
Improving control with Endocrinology management. Lab Results  Component Value Date   HGBA1C 7.5* 09/20/2013

## 2013-09-21 NOTE — Assessment & Plan Note (Signed)
Well controlled on current regimen. Renal function stable, no changes today.  Lab Results  Component Value Date   CREATININE 0.5 09/20/2013

## 2013-09-21 NOTE — Assessment & Plan Note (Signed)
Patient has HS>  Septra DS x 1 week

## 2013-09-21 NOTE — Assessment & Plan Note (Signed)
Suspect CTS,  Referral to Neurology for evaluation

## 2013-10-03 NOTE — Telephone Encounter (Signed)
error 

## 2013-10-06 ENCOUNTER — Telehealth: Payer: Self-pay | Admitting: *Deleted

## 2013-10-06 NOTE — Telephone Encounter (Signed)
Pain in left and right arm , suppose to have referral next Friday to Ortho, just wanted to let you know the left shoulder and wrist are hurting more than when she was in office . FYI Patient stated she has pain medication.

## 2013-10-09 ENCOUNTER — Ambulatory Visit: Payer: BC Managed Care – PPO | Admitting: Internal Medicine

## 2013-10-13 ENCOUNTER — Encounter: Payer: Self-pay | Admitting: Neurology

## 2013-10-13 ENCOUNTER — Ambulatory Visit (INDEPENDENT_AMBULATORY_CARE_PROVIDER_SITE_OTHER): Payer: BC Managed Care – PPO | Admitting: Neurology

## 2013-10-13 VITALS — BP 121/74 | HR 70 | Temp 98.1°F | Ht 63.0 in | Wt 186.7 lb

## 2013-10-13 DIAGNOSIS — M79609 Pain in unspecified limb: Secondary | ICD-10-CM

## 2013-10-13 DIAGNOSIS — R209 Unspecified disturbances of skin sensation: Secondary | ICD-10-CM

## 2013-10-13 NOTE — Progress Notes (Signed)
[facility] Clinic Note - Initial Visit   Date: 10/13/2013    [name] MRN: [id number] DOB: 07-Mar-1961   Dear Dr Darrick Huntsman:  Thank you for your kind referral of [name] for consultation of bilateral arm numbness. Although her history is well known to you, please allow [name] to reiterate it for the purpose of our medical record. The patient was accompanied to the clinic by self.   History of Present Illness: [name] is a 52 y.o. year-old right-handed [place] female with history of diabetes mellitus (HbA1c 7.5), hyperlipidemia, and arthrits presenting for evaluation of bilateral arm numbness.    Patient was in her usual state of health until two weeks ago, when she developed bilateral hand and wrist numbness/tingling and now radiates into her upper arms.  She tried soaking the arms in epson salt which helped slightly.  Symptoms are exacerbated by movements and at nighttime.  She also complains of sharp neck pain.  He also have upper arm pain, which feels like a tooth ache.  She has tried massaging the areas, but it does not help.  Denies any weakness.    Out-side paper records, electronic medical record, and images have been reviewed where available and summarized as:  Lab Results  Component Value Date   HGBA1C 7.5* 09/20/2013     Past Medical History  Diagnosis Date  . Hyperlipidemia   . Diabetes mellitus   . Gastritis with bleeding due to alcohol 2010    [facility] admission  . Campylobacter enteritis May 2012    [facility] admission  . Hypertension   . Arthritis     Past Surgical History  Procedure Laterality Date  . Endometrial biopsy  2004    benign     Medications:  Current Outpatient Prescriptions on File Prior to Visit  Medication Sig Dispense Refill  . atorvastatin (LIPITOR) 40 MG tablet Take 1 tablet (40 mg total) by mouth daily.  90 tablet  3  . etodolac (LODINE) 400 MG tablet Take 400 mg by mouth daily.       Marland Kitchen glucose  blood (RELION GLUCOSE TEST STRIPS) test strip Use 3x a day  200 each  12  . HYDROcodone-acetaminophen (NORCO) 10-325 MG per tablet Take 1 tablet by mouth every 8 (eight) hours as needed for pain.  60 tablet  1  . hydrocortisone (ANUSOL-HC) 25 MG suppository Place 1 suppository (25 mg total) rectally 2 (two) times daily.  12 suppository  0  . insulin aspart protamine- aspart (NOVOLOG MIX 70/30) (70-30) 100 UNIT/ML injection INJECT 35 UNITS 30 MIN BEFORE BREAKFAST; 65 UNITS 30 MIN BEFORE DINNER  20 mL  12  . insulin NPH-regular (NOVOLIN 70/30) (70-30) 100 UNIT/ML injection Inject 35 units 30 min before breakfast and 65 units 30 min before dinner  20 mL  12  . lansoprazole (PREVACID) 30 MG capsule       . medroxyPROGESTERone (DEPO-PROVERA) 150 MG/ML injection Inject 150 mg into the muscle every 3 (three) months.        . metFORMIN (GLUCOPHAGE) 1000 MG tablet TAKE ONE TABLET BY MOUTH TWICE DAILY WITH MEALS  60 tablet  5  . omeprazole (PRILOSEC) 40 MG capsule Take 1 capsule (40 mg total) by mouth daily.  30 capsule  3  . sulfamethoxazole-trimethoprim (SEPTRA DS) 800-160 MG per tablet Take 1 tablet by mouth 2 (two) times daily.  14 tablet  0  . [DISCONTINUED] esomeprazole (NEXIUM) 40 MG capsule Take 40 mg by  mouth daily.       No current facility-administered medications on file prior to visit.    Allergies:  Allergies  Allergen Reactions  . Oxycodone-Acetaminophen     REACTION: Unknown reaction  . Penicillins Rash    Family History: Family History  Problem Relation Age of Onset  . Hypertension Mother   . Stroke Father   . Diabetes Maternal Grandmother   . Hypertension Maternal Grandmother     Social History: History   Social History  . Marital Status: Single    Spouse Name: N/A    Number of Children: N/A  . Years of Education: N/A   Occupational History  . Not on file.   Social History Main Topics  . Smoking status: Former Games developer  . Smokeless tobacco: Never Used  . Alcohol  Use: No  . Drug Use: No  . Sexual Activity: Not on file   Other Topics Concern  . Not on file   Social History Narrative   She works as a Arboriculturist at [employer].   She lives at home with daughter and grandson.    Review of Systems:  CONSTITUTIONAL: No fevers, chills, night sweats, or weight loss.   EYES: No visual changes or eye pain ENT: No hearing changes.  No history of nose bleeds.   RESPIRATORY: No cough, wheezing and shortness of breath.   CARDIOVASCULAR: Negative for chest pain, and palpitations.   GI: Negative for abdominal discomfort, blood in stools or black stools.  No recent change in bowel habits.   GU:  No history of incontinence.   MUSCLOSKELETAL: No history of joint pain or swelling.  +myalgias.   SKIN: Negative for lesions, rash, and itching.   HEMATOLOGY/ONCOLOGY: Negative for prolonged bleeding, bruising easily, and swollen nodes.     ENDOCRINE: Negative for cold or heat intolerance, polydipsia or goiter.   PSYCH:  No depression or anxiety symptoms.   NEURO: As Above.   Vital Signs:  BP 121/74  Pulse 70  Temp(Src) 98.1 F (36.7 C) (Oral)  Ht 5\' 3"  (1.6 m)  Wt 186 lb 11.2 oz (84.687 kg)  BMI 33.08 kg/m2   Neurological Exam: MENTAL STATUS including orientation to time, place, person, recent and remote memory, attention span and concentration, language, and fund of knowledge is normal.  Speech is not dysarthric.  CRANIAL NERVES: II:  No visual field defects.  Unremarkable fundi.   III-IV-VI: Pupils equal round and reactive to light.  Normal conjugate, extra-ocular eye movements in all directions of gaze.  No nystagmus.  No ptosis.   V:  Normal facial sensation.   VII:  Normal facial symmetry and movements.   VIII:  Normal hearing and vestibular function.   IX-X:  Normal palatal movement.   XI:  Normal shoulder shrug and head rotation.   XII:  Normal tongue strength and range of motion, no deviation or fasciculation.  MOTOR:  No atrophy,  fasciculations or abnormal movements.  No pronator drift.  Tone is normal.   No Tinel's sign.  Right Upper Extremity:    Left Upper Extremity:    Deltoid  5/5   Deltoid  5/5   Biceps  5/5   Biceps  5/5   Triceps  5/5   Triceps  5/5   Wrist extensors  5/5   Wrist extensors  5/5   Wrist flexors  5/5   Wrist flexors  5/5   Finger extensors  5/5   Finger extensors  5/5   Finger flexors  5/5   Finger flexors  5/5   Dorsal interossei  5/5   Dorsal interossei  5/5   Abductor pollicis  5/5   Abductor pollicis  5/5   Tone (Ashworth scale)  0  Tone (Ashworth scale)  0   Right Lower Extremity:    Left Lower Extremity:    Hip flexors  5/5   Hip flexors  5/5   Hip extensors  5/5   Hip extensors  5/5   Knee flexors  5/5   Knee flexors  5/5   Knee extensors  5/5   Knee extensors  5/5   Dorsiflexors  5/5   Dorsiflexors  5/5   Plantarflexors  5/5   Plantarflexors  5/5   Toe extensors  5/5   Toe extensors  5/5   Toe flexors  5/5   Toe flexors  5/5   Tone (Ashworth scale)  0  Tone (Ashworth scale)  0   MSRs:  Right                                                                 Left brachioradialis 2+  brachioradialis 2+  biceps 2+  biceps 2+  triceps 2+  triceps 2+  patellar 2+  patellar 2+  ankle jerk 2+  ankle jerk 2+  Hoffman no  Hoffman no  plantar response down  plantar response down   SENSORY:  Normal and symmetric perception of light touch, pinprick, vibration, and proprioception.  Romberg's sign absent.   COORDINATION/GAIT: Normal finger-to- nose-finger and heel-to-shin.  Intact rapid alternating movements bilaterally.  Able to rise from a chair without using arms.  Gait narrow based and stable. Tandem and stressed gait intact.    IMPRESSION: Ms. Laguardia is a 52 year-old female presenting presenting with bilateral upper extremity paresthesias.  On neurological exam, there are no focal sensory or motor deficits.  I suspect that she may have a cervical radiculopathy, but carpal tunnel  syndrome is also possible.  EMG will help differentiate.   PLAN/RECOMMENDATIONS:  1.  EMG of the left upper extremity 2.  Return to clinic in 56-month  The duration of this appointment visit was 40 minutes of face-to-face time with the patient.  Greater than 50% of this time was spent in counseling, explanation of diagnosis, planning of further management, and coordination of care.   Thank you for allowing me to participate in patient's care.  If I can answer any additional questions, I would be pleased to do so.    Sincerely,    Kacia Halley K. Allena Katz, DO

## 2013-10-13 NOTE — Patient Instructions (Signed)
1.  EMG of the left upper extremity 2.  Return to clinic in 77-month

## 2013-10-23 ENCOUNTER — Ambulatory Visit (INDEPENDENT_AMBULATORY_CARE_PROVIDER_SITE_OTHER): Payer: BC Managed Care – PPO | Admitting: Internal Medicine

## 2013-10-23 ENCOUNTER — Encounter: Payer: Self-pay | Admitting: Internal Medicine

## 2013-10-23 VITALS — BP 122/78 | HR 95 | Temp 98.3°F | Resp 10 | Wt 188.0 lb

## 2013-10-23 MED ORDER — INSULIN NPH ISOPHANE & REGULAR (70-30) 100 UNIT/ML ~~LOC~~ SUSP: SUBCUTANEOUS | Status: DC

## 2013-10-23 NOTE — Progress Notes (Signed)
Patient ID: [name], female   DOB: 31-Aug-1961, 52 y.o.   MRN: [id number]  HPI: [name] is a 52 y.o.-year-old female, initially referred by her PCP, Dr.Tullo, for management of DM2, dx ~2000, insulin-dependent since 2010, uncontrolled, with complications (PN, + DR), now returning for f/u for DM2. Last visit 1.5 mo ago.  Last hemoglobin A1c was: Lab Results  Component Value Date   HGBA1C 7.5* 09/20/2013   HGBA1C 8.5* 06/19/2013   HGBA1C 10.3* 01/31/2013   Pt is on a regimen of: - Metformin 1000 mg po bid  - Humulin 70/30 injects 35 in am and 55 in pm. She is now injecting these correctly. We stopped Glipizide 10 mg bid at last visit.  Pt checks her sugars 3x a day and they are: - am: 63-274 >> 52-218, usu. 70-140 >> 58-249 (most low 100s) - mid-afternoon: 63-146 - before dinner: 76-200 >> not checking >> n/c - bedtime: 170-190s >> 301 (forgot insulin with that meal) Has lows. Lowest sugar was 51(pm) ; she has hypoglycemia awareness at 80. Highest sugar was 249.  She gets to work at 5 am.  - no CKD, last BUN/creatinine:  Lab Results  Component Value Date   BUN 10 09/20/2013   CREATININE 0.5 09/20/2013  Last ACR 0.5 06/19/13. Not on ACEI/ARB.  - last set of lipids: Lab Results  Component Value Date   CHOL 162 06/19/2013   HDL 39.60 06/19/2013   LDLCALC 96 06/19/2013   LDLDIRECT 89.7 11/20/2011   TRIG 132.0 06/19/2013   CHOLHDL 4 06/19/2013  She is on Lipitor.   - last eye exam was 6 mo ago. + DR - "I was bleeding behind my eye" - Dr. Mitzi Davenport.  - + numbness and tingling in her feet.  I reviewed her chart and she also has a history of hypertension, hyperlipidemia-on Lipitor, episodic alcohol abuse, GERD, low back pain, osteoarthritis.   ROS: Constitutional: no weight gain/loss, no fatigue, no subjective hyperthermia/hypothermia Eyes: no blurry vision, no xerophthalmia ENT: no sore throat, no nodules palpated in throat, no dysphagia/odynophagia, no  hoarseness Cardiovascular: no CP/SOB/palpitations/leg swelling Respiratory: no cough/SOB Gastrointestinal: no N/V/D/C Musculoskeletal: no muscle/joint aches Skin: no rashes Neurological: no tremors/numbness/tingling/dizziness  I reviewed pt's medications, allergies, PMH, social hx, family hx and no changes required, except as mentioned above.  PE: BP 122/78  Pulse 95  Temp(Src) 98.3 F (36.8 C) (Oral)  Resp 10  Wt 188 lb (85.276 kg)  SpO2 97% Wt Readings from Last 3 Encounters:  10/23/13 188 lb (85.276 kg)  10/13/13 186 lb 11.2 oz (84.687 kg)  09/20/13 189 lb 4 oz (85.843 kg)   Constitutional: overweight, in NAD Eyes: PERRLA, EOMI, no exophthalmos ENT: moist mucous membranes, no thyromegaly, no cervical lymphadenopathy Cardiovascular: RRR, No MRG Respiratory: CTA B Gastrointestinal: abdomen soft, NT, ND, BS+ Musculoskeletal: no deformities, strength intact in all 4 Skin: moist, warm >> area of skin breakdown without purulence under right breast, with minimal induration - she covers it with gauze Neurological: no tremor with outstretched hands, DTR normal in all 4  ASSESSMENT: 1. DM2, insulin-dependent, uncontrolled, with complications - peripheral neuropathy - DR   2. Skin breakdown under right breast  3. Acid reflux  PLAN:  1. Patient with long-standing, recently more controlled diabetes, on a premixed insulin regimen (limited finances). She still has lows, but not in the 40s anymore. She has occasional highs (especially when forgets to take the insulin). - I advised her to: Decrease insulin 70/30 to 30  units 30 min before breakfast and 50 units 30 min before dinner.  Continue Metformin. Please return in 2 months with your sugar log.  - continue checking her sugars at different times of the day - check 2-3 times a day, rotating checks  - had flu vaccine this fall - she is up-to-date with eye exams, ACR - Return to clinic in 2 mo with sugar log   2. Skin  breakdown - advised to put Neosporin cream on the gauze for at least a week, then dry gauze  3. Acid reflux - she asks me to refill her PPI, but this has been refilled by Dr Darrick Huntsman on 10/22.

## 2013-10-23 NOTE — Patient Instructions (Addendum)
Please decrease the 70/30 to 30 units in am and 50 units before dinner. Try not to skip doses.  Please return for another visit in 2 months (after January 22, so we can check a HbA1C). Please let me know if sugars <70 or >180 consistently.

## 2013-11-02 ENCOUNTER — Ambulatory Visit (INDEPENDENT_AMBULATORY_CARE_PROVIDER_SITE_OTHER): Payer: BC Managed Care – PPO | Admitting: Neurology

## 2013-11-02 ENCOUNTER — Encounter: Payer: Self-pay | Admitting: Neurology

## 2013-11-02 DIAGNOSIS — G5603 Carpal tunnel syndrome, bilateral upper limbs: Secondary | ICD-10-CM

## 2013-11-02 DIAGNOSIS — G5600 Carpal tunnel syndrome, unspecified upper limb: Secondary | ICD-10-CM

## 2013-11-02 DIAGNOSIS — G5620 Lesion of ulnar nerve, unspecified upper limb: Secondary | ICD-10-CM

## 2013-11-02 NOTE — Procedures (Signed)
[facility]  [address] [place], [address]  [place], [place] [address] Tel: [phone] Fax:  [phone] Test Date:  10/30/2013  Patient: [name] DOB: 1961-05-20 Physician: Nita Sickle, DO  Sex: Female Height:  Ref Phys:   ID#: [id number] Temp: 31.6C Technician:    Patient Complaints: This is  52 year-old female presenting with 88-month history of bilateral arm paresthesias and neck pain.  NCV & EMG Findings: Extensive evaluation of the left upper extremity and additional studies of the right reveals: 1. Prolonged left median motor (4.46ms) and sensory (4.62ms) responses with preserved amplitudes.  The right median sensory response is also prolonged (4.61ms), and the right motor response is normal. 2. The left ulnar motor and the right ulnar motor nerves showed decreased conduction velocity (A Elbow-B Elbow, L34, R38 m/s).  The left dorsal cutaneous sensory, the left ulnar sensory, and the right ulnar sensory nerves showed prolonged distal peak latency (L4.4, L3.5, R3.4 ms).   3. Needle electrode examination shows rare chronic motor axon loss changes isolated to the flexor digitorum profundus 4&5 bilaterally.  No active denervation is seen.   Impression: 1. Bilateral median neuropathy at or distal to the wrist consistent with the diagnosis of carpal tunnel syndrome.  These changes are moderate in degree electrically on the left and mild on the right. 2. Bilateral ulnar neuropathy with slowing across the elbow, predominately demyelinating in type, mild-moderate in degree electrically. 3. There is no evidence of a cervical radiculopathy affecting the left side.   ___________________________ Nita Sickle, DO    Nerve Conduction Studies Anti Sensory Summary Table   Site NR Peak (ms) Norm Peak (ms) P-T Amp (V) Norm P-T Amp  Left DorsCutan Anti Sensory (Dorsum 5th MC)  Wrist    4.4 <3.1 10.8 >10  Left Median Anti Sensory (2nd Digit)  Wrist    4.4 <3.6 22.8 >15  Right  Median Anti Sensory (2nd Digit)  Wrist    4.4 <3.6 16.6 >15  Left Ulnar Anti Sensory (5th Digit)  Wrist    3.5 <3.1 19.6 >10  Right Ulnar Anti Sensory (5th Digit)  Wrist    3.4 <3.1 16.0 >10   Motor Summary Table   Site NR Onset (ms) Norm Onset (ms) O-P Amp (mV) Norm O-P Amp Site1 Site2 Delta-0 (ms) Dist (cm) Vel (m/s) Norm Vel (m/s)  Left Median Motor (Abd Poll Brev)  Wrist    4.1 <4.0 6.6 >6 Elbow Wrist 5.7 29.0 51 >50  Elbow    9.8  6.5         Right Median Motor (Abd Poll Brev)  Wrist    3.5 <4.0 6.9 >6 Elbow Wrist 5.1 28.0 55 >50  Elbow    8.6  6.2         Left Ulnar Motor (Abd Dig Minimi)  Wrist    2.3 <3.1 9.6 >7 B Elbow Wrist 3.7 22.0 59 >50  B Elbow    6.0  9.2  A Elbow B Elbow 2.9 10.0 34 >50  A Elbow    8.9  7.7         Right Ulnar Motor (Abd Dig Minimi)  Wrist    2.7 <3.1 9.7 >7 B Elbow Wrist 3.6 21.0 58 >50  B Elbow    6.3  9.1  A Elbow B Elbow 2.6 10.0 38 >50  A Elbow    8.9  8.6         Left Ulnar (FDI) Motor (1st DI)  Wrist  4.5 <4.5 17.6 >7         EMG   Side Muscle Ins Act Fibs Psw Fasc Number Recrt Dur Dur. Amp Amp. Poly Poly. Comment  Left 1stDorInt Nml Nml Nml Nml Nml Nml Nml Nml Nml Nml Nml Nml N/A  Left Abd Poll Brev Nml Nml Nml Nml Nml Nml Nml Nml Nml Nml Nml Nml N/A  Left FlexPolLong Nml Nml Nml Nml Nml Nml Nml Nml Nml Nml Nml Nml N/A  Left PronatorTeres Nml Nml Nml Nml Nml Nml Nml Nml Nml Nml Nml Nml N/A  Left Biceps Nml Nml Nml Nml Nml Nml Nml Nml Nml Nml Nml Nml N/A  Left FlexDigProf 4,5 Nml Nml Nml Nml 1- Mod Few 1+ Nml Nml Few 1+ N/A  Left Triceps Nml Nml Nml Nml Nml Nml Nml Nml Nml Nml Nml Nml N/A  Left Deltoid Nml Nml Nml Nml Nml Nml Nml Nml Nml Nml Nml Nml N/A  Right 1stDorInt Nml Nml Nml Nml Nml Nml Nml Nml Nml Nml Nml Nml N/A  Right FlexDigProf 4,5 Nml Nml Nml Nml 1- Mod Few 1+ Nml Nml Few 1+ N/A  Right Triceps Nml Nml Nml Nml Nml Nml Nml Nml Nml Nml Nml Nml N/A    Notes: Patient rewarmed twice for sensory NCS.  Waveforms:

## 2013-11-02 NOTE — Progress Notes (Signed)
See procedure note under "Notes" tab for EMG results.  Donika K. Patel, DO  

## 2013-11-07 ENCOUNTER — Telehealth: Payer: Self-pay | Admitting: Neurology

## 2013-11-07 NOTE — Telephone Encounter (Signed)
Called and discussed EMG results with the patient. She has bilateral carpal tunnel and ulnar neuropathy at the elbow. There is no evidence of a cervical radiculopathy. He continues to have pain and I have recommended using a wrist splint and a  elbow pad. Because of her persistent symptoms, she is interested in a referral to orthopedics for steroid injection.  Wendy K. Allena Katz, DO

## 2013-11-10 ENCOUNTER — Ambulatory Visit: Payer: BC Managed Care – PPO | Admitting: Neurology

## 2013-11-15 ENCOUNTER — Other Ambulatory Visit: Payer: Self-pay | Admitting: *Deleted

## 2013-11-15 DIAGNOSIS — G5603 Carpal tunnel syndrome, bilateral upper limbs: Secondary | ICD-10-CM

## 2013-11-15 NOTE — Telephone Encounter (Signed)
Referral orders placed

## 2013-11-28 ENCOUNTER — Other Ambulatory Visit: Payer: Self-pay | Admitting: *Deleted

## 2013-12-04 ENCOUNTER — Other Ambulatory Visit: Payer: Self-pay | Admitting: *Deleted

## 2013-12-04 MED ORDER — LANSOPRAZOLE 30 MG PO CPDR: 30.0000 mg | DELAYED_RELEASE_CAPSULE | Freq: Every day | ORAL | Status: DC

## 2013-12-26 ENCOUNTER — Encounter: Payer: Self-pay | Admitting: Internal Medicine

## 2013-12-26 ENCOUNTER — Ambulatory Visit (INDEPENDENT_AMBULATORY_CARE_PROVIDER_SITE_OTHER): Payer: BC Managed Care – PPO | Admitting: Internal Medicine

## 2013-12-26 ENCOUNTER — Ambulatory Visit: Payer: BC Managed Care – PPO | Admitting: Internal Medicine

## 2013-12-26 VITALS — BP 122/76 | HR 94 | Temp 98.4°F | Resp 12 | Wt 181.0 lb

## 2013-12-26 DIAGNOSIS — E1165 Type 2 diabetes mellitus with hyperglycemia: Principal | ICD-10-CM

## 2013-12-26 DIAGNOSIS — IMO0001 Reserved for inherently not codable concepts without codable children: Secondary | ICD-10-CM

## 2013-12-26 LAB — HEMOGLOBIN A1C: HEMOGLOBIN A1C: 9.5 % — AB (ref 4.6–6.5)

## 2013-12-26 NOTE — Patient Instructions (Addendum)
Start a magnesium supplement 400-500 mg daily for leg cramps. This is over the counter.  Continue insulin 70/30 to 30 units 30 min before breakfast and 50 units 30 min before dinner.  Continue Metformin. Check sugars at 3 am >> if sugars <100, lower the 70/30 insulin at night to 40 units.  Please return in 1 month with your sugar log.

## 2013-12-26 NOTE — Progress Notes (Signed)
Patient ID: [name], female   DOB: 12-27-1960, 53 y.o.   MRN: [id number]  HPI: [name] is a 53 y.o.-year-old female, initially referred by her PCP, Dr.Tullo, for management of DM2, dx ~2000, insulin-dependent since 2010, uncontrolled, with complications (PN, + DR), now returning for f/u for DM2. Last visit 3 mo ago.  She just buried her brother. He was 40 y/o. She is verys stressed about this. She got 2 cortisone injections 3 weeks ago in her wrists.   Last hemoglobin A1c was: Lab Results  Component Value Date   HGBA1C 7.5* 09/20/2013   HGBA1C 8.5* 06/19/2013   HGBA1C 10.3* 01/31/2013   Pt is on a regimen of: - Metformin 1000 mg po bid  - Humulin 70/30 injects 30 in am (7:30 am) and 50 in pm (7-8 pm)  We stopped Glipizide 10 mg bid.  Pt checks her sugars 3x a day and they are - no log!: - am: 63-274 >> 52-218, usu. 70-140 >> 58-249 (most low 100s) >> 200-300s - mid-afternoon: 63-146 >> n/c - before dinner: 76-200 >> not checking >> n/c >> 114-200 - bedtime: 170-190s >> 301 (forgot insulin with that meal) >> cannot remember (54 >> 170s) - 3 am: sweating No more lows. Lowest sugar was 79 ; she has hypoglycemia awareness at 80. Highest sugar was 300s. She gets to work at 5 am.  - no CKD, last BUN/creatinine:  Lab Results  Component Value Date   BUN 10 09/20/2013   CREATININE 0.5 09/20/2013  Last ACR 0.5 06/19/13. Not on ACEI/ARB.  - last set of lipids: Lab Results  Component Value Date   CHOL 162 06/19/2013   HDL 39.60 06/19/2013   LDLCALC 96 06/19/2013   LDLDIRECT 89.7 11/20/2011   TRIG 132.0 06/19/2013   CHOLHDL 4 06/19/2013  She is on Lipitor.   - last eye exam was 9 mo ago. + DR - "I was bleeding behind my eye" - Dr. Ricki Miller.  - + numbness and tingling in her feet. She also has leg cramps.  I reviewed her chart and she also has a history of hypertension, hyperlipidemia-on Lipitor, episodic alcohol abuse, GERD, low back pain, osteoarthritis.    ROS: Constitutional: no weight gain/loss, no fatigue, no subjective hyperthermia/hypothermia Eyes: no blurry vision, no xerophthalmia ENT: no sore throat, no nodules palpated in throat, no dysphagia/odynophagia, no hoarseness Cardiovascular: no CP/SOB/palpitations/leg swelling. She c/o leg cramps. Respiratory: no cough/SOB Gastrointestinal: no N/V/D/C Musculoskeletal: no muscle/joint aches Skin: no rashes Neurological: no tremors/numbness/tingling/dizziness  I reviewed pt's medications, allergies, PMH, social hx, family hx and no changes required, except as mentioned above.  PE: BP 122/76  Pulse 94  Temp(Src) 98.4 F (36.9 C) (Oral)  Resp 12  Wt 181 lb (82.101 kg)  SpO2 95% Wt Readings from Last 3 Encounters:  12/26/13 181 lb (82.101 kg)  10/23/13 188 lb (85.276 kg)  10/13/13 186 lb 11.2 oz (84.687 kg)   Constitutional: overweight, in NAD Eyes: PERRLA, EOMI, no exophthalmos ENT: moist mucous membranes, no thyromegaly, no cervical lymphadenopathy Cardiovascular: RRR, No MRG Respiratory: CTA B Gastrointestinal: abdomen soft, NT, ND, BS+ Musculoskeletal: no deformities, strength intact in all 4 Skin: moist, warm >> area of skin erythema under breasts Neurological: no tremor with outstretched hands, DTR normal in all 4  ASSESSMENT: 1. DM2, insulin-dependent, uncontrolled, with complications - peripheral neuropathy - DR   PLAN:  1. Patient with long-standing,uncontrolled diabetes, on a premixed insulin regimen (limited finances). She has no more lows. She has  high sugars in am, but I believe this is due to noncompliance with the pm dose vs dawn phenomenon. - I advised her to: Continue insulin 70/30 to 30 units 30 min before breakfast and 50 units 30 min before dinner.  Continue Metformin. Check sugars at 3 am >> if sugars <100, lower the 70/30 insulin at night to 40 units. Please return in 1 month with your sugar log.  - continue checking her sugars at different times  of the day - check 2-3 times a day, rotating checks  - had flu vaccine this fall - she is up-to-date with eye exams - will check A1c today - Return to clinic in 1 mo with sugar log   Office Visit on 12/26/2013  Component Date Value Range Status  . Hemoglobin A1C 12/26/2013 9.5* 4.6 - 6.5 % Final   Glycemic Control Guidelines for People with Diabetes:Non Diabetic:  <6%Goal of Therapy: <7%Additional Action Suggested:  >8%    The only explanation of the above HbA1c after a period of improved control in noncompliance with the insulin. Will not change regimen but see her back in 1 mo with her log.

## 2013-12-27 ENCOUNTER — Encounter: Payer: Self-pay | Admitting: *Deleted

## 2014-01-03 ENCOUNTER — Telehealth: Payer: Self-pay | Admitting: Neurology

## 2014-01-03 NOTE — Telephone Encounter (Signed)
Report has already been sent.

## 2014-01-03 NOTE — Telephone Encounter (Signed)
Spoke with patient. Records and referral faxed to Dr Milly Jakob at [facility]. [phone].

## 2014-01-03 NOTE — Telephone Encounter (Signed)
Pt called stating that the specialists that Dr. Posey Pronto had referred her has not received the paper work that Dr. Posey Pronto was suppose to fill out prior to her being seen.   Please give her a call back.

## 2014-01-11 ENCOUNTER — Other Ambulatory Visit: Payer: Self-pay | Admitting: *Deleted

## 2014-01-15 MED ORDER — GLUCOSE BLOOD VI STRP
ORAL_STRIP | Status: DC
Start: ? — End: 2014-06-15

## 2014-01-26 ENCOUNTER — Encounter: Payer: Self-pay | Admitting: Internal Medicine

## 2014-01-26 ENCOUNTER — Ambulatory Visit (INDEPENDENT_AMBULATORY_CARE_PROVIDER_SITE_OTHER): Payer: BC Managed Care – PPO | Admitting: Internal Medicine

## 2014-01-26 VITALS — BP 118/64 | HR 88 | Temp 97.9°F | Resp 12 | Wt 190.0 lb

## 2014-01-26 DIAGNOSIS — IMO0001 Reserved for inherently not codable concepts without codable children: Secondary | ICD-10-CM

## 2014-01-26 DIAGNOSIS — E1165 Type 2 diabetes mellitus with hyperglycemia: Principal | ICD-10-CM

## 2014-01-26 NOTE — Patient Instructions (Addendum)
Continue insulin 70/30 30 units 30 min before breakfast and 50 units 30 min before dinner.  Continue Metformin. Start going for walks or going to the gym. Write sugars in the log in the right columns. Please return in 2 months with your sugar log.

## 2014-01-26 NOTE — Progress Notes (Signed)
Patient ID: [name], female   DOB: 06-18-1961, 53 y.o.   MRN: [id number]  HPI: MIIA BLANKS is a 53 y.o.-year-old female, initially referred by her PCP, Dr.Tullo, for management of DM2, dx ~2000, insulin-dependent since 2010, uncontrolled, with complications (PN, + DR), now returning for f/u for DM2. Last visit 1 mo ago.  Last hemoglobin A1c was: Lab Results  Component Value Date   HGBA1C 9.5* 12/26/2013   HGBA1C 7.5* 09/20/2013   HGBA1C 8.5* 06/19/2013  Had cortisone inj in wrists in 10/2013.  Pt is on a regimen of: - Metformin 1000 mg po bid  - Humulin 70/30 injects 30 in am (7:30 am) and 50 in pm (7-8 pm) >> 30 units 30 min before breakfast and 50 units 30 min before dinner.  We stopped Glipizide 10 mg bid.  Pt checks her sugars 3x a day and they are - reviewed log: - am: 63-274 >> 52-218, usu. 70-140 >> 58-249 (most low 100s) >> 200-300s >> 146-211 - lunch: n/c - mid-afternoon: 63-146 >> 78-174 - before dinner: 76-200 >> not checking >> n/c >> 114-200 >> 59-183 - bedtime: 170-190s >> 301 (forgot insulin with that meal) >> cannot remember (79 >> 170s) >> 87-269  - 3 am: sweating No more lows. Lowest sugar was 59 (skipped lunch); she has hypoglycemia awareness at 80. Highest sugar was 441 x 1 She gets to work at 5 am.  - no CKD, last BUN/creatinine:  Lab Results  Component Value Date   BUN 10 09/20/2013   CREATININE 0.5 09/20/2013  Last ACR 0.5 06/19/13. Not on ACEI/ARB.  - last set of lipids: Lab Results  Component Value Date   CHOL 162 06/19/2013   HDL 39.60 06/19/2013   LDLCALC 96 06/19/2013   LDLDIRECT 89.7 11/20/2011   TRIG 132.0 06/19/2013   CHOLHDL 4 06/19/2013  She is on Lipitor.   - last eye exam was in 02/2013. + DR - "I was bleeding behind my eye" - Dr. Ricki Miller.  - + numbness and tingling in her feet. She also has leg cramps.  I reviewed her chart and she also has a history of hypertension, hyperlipidemia-on Lipitor, episodic alcohol abuse, GERD, low  back pain, osteoarthritis.   ROS: Constitutional: + weight gain, no fatigue, no subjective hyperthermia/hypothermia Eyes: no blurry vision, no xerophthalmia ENT: no sore throat, no nodules palpated in throat, no dysphagia/odynophagia, no hoarseness Cardiovascular: no CP/SOB/palpitations/leg swelling. She c/o leg cramps. Respiratory: no cough/SOB Gastrointestinal: no N/V/D/C Musculoskeletal: + muscle/no joint aches, + bone pain Skin: no rashes Neurological: no tremors/numbness/tingling/dizziness  I reviewed pt's medications, allergies, PMH, social hx, family hx and no changes required, except as mentioned above.  PE: BP 118/64  Pulse 88  Temp(Src) 97.9 F (36.6 C) (Oral)  Resp 12  Wt 190 lb (86.183 kg)  SpO2 98% Wt Readings from Last 3 Encounters:  01/26/14 190 lb (86.183 kg)  12/26/13 181 lb (82.101 kg)  10/23/13 188 lb (85.276 kg)   Constitutional: overweight, in NAD Eyes: PERRLA, EOMI, no exophthalmos ENT: moist mucous membranes, no thyromegaly, no cervical lymphadenopathy Cardiovascular: RRR, No MRG Respiratory: CTA B Gastrointestinal: abdomen soft, NT, ND, BS+ Musculoskeletal: no deformities, strength intact in all 4 Skin: moist, warm Neurological: no tremor with outstretched hands, DTR normal in all 4  ASSESSMENT: 1. DM2, insulin-dependent, uncontrolled, with complications - peripheral neuropathy - DR   PLAN:  1. Patient with long-standing,uncontrolled diabetes, on a premixed insulin regimen (limited finances). She has few lows and some highs,  therefore we cannot move the doses in any directions.  She gained 9 lbs due to inactivity since last visit - advised to improve diet and start moving. - I advised her to: Patient Instructions  Continue insulin 70/30 30 units 30 min before breakfast and 50 units 30 min before dinner.  Continue Metformin. Start going for walks or going to the gym. Write sugars in the log in the right columns. Please return in 2 months with  your sugar log.  - continue checking her sugars at different times of the day - check 3 times a day, rotating checks  - had flu vaccine this fall - she is up-to-date with eye exams - will check A1c next time - Return to clinic in 2 mo with sugar log

## 2014-02-12 ENCOUNTER — Telehealth: Payer: Self-pay | Admitting: Emergency Medicine

## 2014-02-12 MED ORDER — HYDROCODONE-ACETAMINOPHEN 10-325 MG PO TABS: 1.0000 | ORAL_TABLET | Freq: Three times a day (TID) | ORAL | Status: DC | PRN

## 2014-02-12 NOTE — Telephone Encounter (Signed)
Patient calling wanting the number to [facility] for apt with painful feet. I have provided her the number. Pt is asking that we give her a script for HYDROcodone-acetaminophen (NORCO) 10-325 MG per tablet for the same reason until she see the doc.

## 2014-02-12 NOTE — Telephone Encounter (Signed)
Last OV 10/14 last fill  On Hydrocodone was 09/20/13 Please advise.

## 2014-02-12 NOTE — Telephone Encounter (Signed)
Placed script up front for pick up left message for patient to return call to office.

## 2014-02-28 LAB — HM DIABETES EYE EXAM

## 2014-03-26 ENCOUNTER — Ambulatory Visit (INDEPENDENT_AMBULATORY_CARE_PROVIDER_SITE_OTHER): Payer: BC Managed Care – PPO | Admitting: Internal Medicine

## 2014-03-26 ENCOUNTER — Encounter: Payer: Self-pay | Admitting: Internal Medicine

## 2014-03-26 VITALS — BP 112/68 | HR 92 | Temp 97.8°F | Resp 12 | Wt 188.0 lb

## 2014-03-26 DIAGNOSIS — E1165 Type 2 diabetes mellitus with hyperglycemia: Principal | ICD-10-CM

## 2014-03-26 DIAGNOSIS — IMO0001 Reserved for inherently not codable concepts without codable children: Secondary | ICD-10-CM

## 2014-03-26 LAB — HEMOGLOBIN A1C: HEMOGLOBIN A1C: 7 % — AB (ref 4.6–6.5)

## 2014-03-26 NOTE — Patient Instructions (Signed)
Continue insulin 70/30 30 units 30 min before breakfast and 50 units 30 min before dinner.  Continue Metformin. Start going for walks or going to the gym. Please return in 3 months with your sugar log.

## 2014-03-26 NOTE — Progress Notes (Signed)
Patient ID: [name], female   DOB: 1961-08-10, 53 y.o.   MRN: [id number]  HPI: [name] is a 53 y.o.-year-old female, initially referred by her PCP, Dr.Tullo, for management of DM2, dx ~2000, insulin-dependent since 2010, uncontrolled, with complications (PN, + DR), now returning for f/u for DM2. Last visit 2 mo ago.  Last hemoglobin A1c was: Lab Results  Component Value Date   HGBA1C 9.5* 12/26/2013   HGBA1C 7.5* 09/20/2013   HGBA1C 8.5* 06/19/2013  Had cortisone inj in wrists in 10/2013.  Pt is on a regimen of: - Metformin 1000 mg po bid  - Humulin 70/30 injects 30 units 30 min before breakfast and 50 units 30 min before dinner.  We stopped Glipizide 10 mg bid.  Pt checks her sugars 3x a day and they are - reviewed log: - am: 63-274 >> 52-218, usu. 70-140 >> 58-249 (most low 100s) >> 200-300s >> 146-211 >> 85-209 (110-180) - 2h after b'fast: 120-195 - lunch: n/c  - mid-afternoon: 63-146 >> 78-174 >> 51-209 (100-170) - before dinner: 76-200 >> not checking >> n/c >> 114-200 >> 59-183 >> 51x1, 91-160, 296 x1 - bedtime: 170-190s >> 301 (forgot insulin with that meal) >> cannot remember (79 >> 170s) >> 87-269  >> 82-167 Lowest sugar was 39 (skipped lunch); she has hypoglycemia awareness at 80. Highest sugar was 383. She gets to work at 5 am.  - no CKD, last BUN/creatinine:  Lab Results  Component Value Date   BUN 10 09/20/2013   CREATININE 0.5 09/20/2013  Last ACR 0.5 06/19/13. Not on ACEI/ARB.  - last set of lipids: Lab Results  Component Value Date   CHOL 162 06/19/2013   HDL 39.60 06/19/2013   LDLCALC 96 06/19/2013   LDLDIRECT 89.7 11/20/2011   TRIG 132.0 06/19/2013   CHOLHDL 4 06/19/2013  She is on Lipitor.   - last eye exam was in 02/2014. + DR L>R - Dr. Ricki Miller.  - + numbness and tingling in her feet. She also has leg cramps.  She also has a history of hypertension, hyperlipidemia-on Lipitor, episodic alcohol abuse, GERD, low back pain, osteoarthritis.    ROS: Constitutional: no weight gain/loss, no fatigue, no subjective hyperthermia/hypothermia Eyes: no blurry vision, no xerophthalmia ENT: no sore throat, no nodules palpated in throat, no dysphagia/odynophagia, no hoarseness Cardiovascular: no CP/SOB/palpitations/leg swelling. She c/o leg cramps. Respiratory: no cough/SOB Gastrointestinal: no N/V/D/C Musculoskeletal: + muscle/no joint aches Skin: no rashes Neurological: no tremors/numbness/tingling/dizziness  I reviewed pt's medications, allergies, PMH, social hx, family hx and no changes required, except as mentioned above.  PE: BP 112/68  Pulse 92  Temp(Src) 97.8 F (36.6 C) (Oral)  Resp 12  Wt 188 lb (85.276 kg)  SpO2 97% Wt Readings from Last 3 Encounters:  03/26/14 188 lb (85.276 kg)  01/26/14 190 lb (86.183 kg)  12/26/13 181 lb (82.101 kg)   Constitutional: overweight, in NAD Eyes: PERRLA, EOMI, no exophthalmos ENT: moist mucous membranes, no thyromegaly, no cervical lymphadenopathy Cardiovascular: RRR, No MRG Respiratory: CTA B Gastrointestinal: abdomen soft, NT, ND, BS+ Musculoskeletal: no deformities, strength intact in all 4 Skin: moist, warm Neurological: no tremor with outstretched hands, DTR normal in all 4  ASSESSMENT: 1. DM2, insulin-dependent, uncontrolled, with complications - peripheral neuropathy - DR   PLAN:  1. Patient with long-standing,uncontrolled diabetes, on a premixed insulin regimen (limited finances). She has very few few lows and some highs, therefore we cannot move the doses in any direction.  - I advised her  to: Patient Instructions  Continue insulin 70/30 30 units 30 min before breakfast and 50 units 30 min before dinner.  Continue Metformin. - continue checking her sugars at different times of the day - check 3 times a day, rotating checks  - she is up-to-date with eye exams - will check A1c today - Return to clinic in 3 mo with sugar log   Office Visit on 03/26/2014   Component Date Value Ref Range Status  . Hemoglobin A1C 03/26/2014 7.0* 4.6 - 6.5 % Final   Glycemic Control Guidelines for People with Diabetes:Non Diabetic:  <6%Goal of Therapy: <7%Additional Action Suggested:  >8%    Excellent A1c.

## 2014-04-03 ENCOUNTER — Other Ambulatory Visit: Payer: Self-pay | Admitting: Orthopedic Surgery

## 2014-05-09 ENCOUNTER — Ambulatory Visit: Payer: Self-pay | Admitting: Internal Medicine

## 2014-05-09 ENCOUNTER — Encounter (HOSPITAL_BASED_OUTPATIENT_CLINIC_OR_DEPARTMENT_OTHER): Payer: Self-pay | Admitting: *Deleted

## 2014-05-09 NOTE — Progress Notes (Signed)
Has been seeing endocrinology to manage diabetes-still high-no drinking alcohol-trying to watch diet-to come in for [id number]

## 2014-05-09 NOTE — H&P (Signed)
[name] is an 53 y.o. female.   CC / Reason for Visit: Bilateral upper extremity complaints HPI: This patient presents for reevaluation.  She reports that she is using a nighttime splints on her wrist and that is helping somewhat.  In addition, she did have carpal tunnel injections done on 12-06-13.  I have been able to review her nerve studies performed on 10-30-13, revealing bilateral cubital and carpal tunnel syndromes.  She continues to report that her left arm feels like a toothache, and has reports of pain up and down the arm in addition to numbness and tingling in the fingers.    Presenting history follows: This patient is a 53 year old female who presents for evaluation of multiple complaints related to both upper extremities.  She reports that she has been evaluated by neurology and had electrodiagnostic studies performed.  She reports that she has been told she has carpal tunnel syndrome.  None of these records are available for me today.  She doesn't really feel numbness or tingling or pins and needles but relates mostly a feeling of tightness in the digits that occurs all the time, but is worse at night and with driving and speaking on the phone.  Her left is worse than the right.  She reports that she "just hurts all the time".  Past Medical History  Diagnosis Date  . Hyperlipidemia   . Diabetes mellitus   . Gastritis with bleeding due to alcohol 2010    [facility] admission  . Campylobacter enteritis May 2012    [facility] admission  . Arthritis   . Carpal tunnel syndrome, bilateral   . Hypertension     no meds  . Wears glasses   . GERD (gastroesophageal reflux disease)     Past Surgical History  Procedure Laterality Date  . Endometrial biopsy  2004    benign  . Tonsillectomy    . Foot osteotomy  2010    right-spurs  . Cyst excision  1995    under arms    Family History  Problem Relation Age of Onset  . Hypertension Mother   . Stroke Father   . Diabetes Maternal Grandmother    . Hypertension Maternal Grandmother    Social History:  reports that she quit smoking about 2 years ago. She has never used smokeless tobacco. She reports that she does not drink alcohol or use illicit drugs.  Allergies:  Allergies  Allergen Reactions  . Oxycodone-Acetaminophen     REACTION: Unknown reaction  . Penicillins Rash    No prescriptions prior to admission    No results found for this or any previous visit (from the past 48 hour(s)). No results found.  Review of Systems  All other systems reviewed and are negative.   Height 5\' 3"  (1.6 m), weight 85.276 kg (188 lb). Physical Exam  Constitutional:  WD, WN, NAD HEENT:  NCAT, EOMI Neuro/Psych:  Alert & oriented to person, place, and time; appropriate mood & affect Lymphatic: No generalized UE edema or lymphadenopathy Extremities / MSK:  Both UE are normal with respect to appearance, ranges of motion, joint stability, muscle strength/tone, sensation, & perfusion except as otherwise noted:  3.6 1 monofilament across all digits right and left.  Positive carpal compression testing, worse on the left than the right.  Positive elbow flexion test  Labs / Xrays:  No radiographic studies obtained today.  Assessment: Bilateral cubital and carpal tunnel syndromes  Plan:  We discussed her present situation.  I reviewed  the findings of the nerve studies and the clinical impression that this represents problems with both nerves.  In addition to encouraging her to keep a tight control of her diabetes, I recommended ulnar neuroplasty at the elbow and endoscopic carpal tunnel release.  She would like to start on the left side.  We will start her on gabapentin today and proceed surgically after she calls Juliann Pulse to arrange this, but she is thinking presently she would like to proceed in March.  We also discussed the details as it relates to return to work.  The details of the operative procedure were discussed with the patient.  Questions  were invited and answered.  In addition to the goal of the procedure, the risks of the procedure to include but not limited to bleeding; infection; damage to the nerves or blood vessels that could result in bleeding, numbness, weakness, chronic pain, and the need for additional procedures; stiffness; the need for revision surgery; and anesthetic risks, the worst of which is death, were reviewed.  No specific outcome was guaranteed or implied.  Informed consent was obtained.   Aritzel Krusemark A. 05/09/2014, 3:14 PM

## 2014-05-11 ENCOUNTER — Encounter (HOSPITAL_BASED_OUTPATIENT_CLINIC_OR_DEPARTMENT_OTHER)
Admission: RE | Admit: 2014-05-11 | Discharge: 2014-05-11 | Disposition: A | Discharge status: Home / Self Care | Payer: BC Managed Care – PPO | Source: Ambulatory Visit | Attending: Orthopedic Surgery | Admitting: Orthopedic Surgery

## 2014-05-11 ENCOUNTER — Other Ambulatory Visit: Payer: Self-pay | Admitting: Internal Medicine

## 2014-05-11 LAB — BASIC METABOLIC PANEL
BUN: 12 mg/dL (ref 6–23)
CO2: 23 mEq/L (ref 19–32)
CREATININE: 0.66 mg/dL (ref 0.50–1.10)
Calcium: 9.2 mg/dL (ref 8.4–10.5)
Chloride: 101 mEq/L (ref 96–112)
GFR calc Af Amer: >90 mL/min (ref >90)
GFR calc non Af Amer: >90 mL/min (ref >90)
GLUCOSE: 249 mg/dL — AB (ref 70–99)
POTASSIUM: 4 meq/L (ref 3.7–5.3)
Sodium: 137 mEq/L (ref 137–147)

## 2014-05-14 ENCOUNTER — Encounter (HOSPITAL_BASED_OUTPATIENT_CLINIC_OR_DEPARTMENT_OTHER): Payer: Self-pay | Admitting: *Deleted

## 2014-05-14 ENCOUNTER — Ambulatory Visit (HOSPITAL_BASED_OUTPATIENT_CLINIC_OR_DEPARTMENT_OTHER): Payer: BC Managed Care – PPO | Admitting: Anesthesiology

## 2014-05-14 ENCOUNTER — Encounter (HOSPITAL_BASED_OUTPATIENT_CLINIC_OR_DEPARTMENT_OTHER): Payer: BC Managed Care – PPO | Admitting: Anesthesiology

## 2014-05-14 ENCOUNTER — Ambulatory Visit (HOSPITAL_BASED_OUTPATIENT_CLINIC_OR_DEPARTMENT_OTHER)
Admission: RE | Admit: 2014-05-14 | Discharge: 2014-05-14 | Disposition: A | Discharge status: Home / Self Care | Payer: BC Managed Care – PPO | Source: Ambulatory Visit | Attending: Orthopedic Surgery | Admitting: Orthopedic Surgery

## 2014-05-14 ENCOUNTER — Encounter (HOSPITAL_BASED_OUTPATIENT_CLINIC_OR_DEPARTMENT_OTHER)
Admission: RE | Disposition: A | Discharge status: Home / Self Care | Payer: Self-pay | Source: Ambulatory Visit | Attending: Orthopedic Surgery

## 2014-05-14 DIAGNOSIS — G5600 Carpal tunnel syndrome, unspecified upper limb: Secondary | ICD-10-CM | POA: Insufficient documentation

## 2014-05-14 DIAGNOSIS — E119 Type 2 diabetes mellitus without complications: Secondary | ICD-10-CM | POA: Insufficient documentation

## 2014-05-14 DIAGNOSIS — Z87891 Personal history of nicotine dependence: Secondary | ICD-10-CM | POA: Insufficient documentation

## 2014-05-14 DIAGNOSIS — G5620 Lesion of ulnar nerve, unspecified upper limb: Secondary | ICD-10-CM | POA: Insufficient documentation

## 2014-05-14 DIAGNOSIS — I10 Essential (primary) hypertension: Secondary | ICD-10-CM | POA: Insufficient documentation

## 2014-05-14 DIAGNOSIS — Z794 Long term (current) use of insulin: Secondary | ICD-10-CM | POA: Insufficient documentation

## 2014-05-14 HISTORY — DX: Presence of spectacles and contact lenses: Z97.3

## 2014-05-14 HISTORY — PX: ULNAR NERVE TRANSPOSITION: SHX2595

## 2014-05-14 HISTORY — DX: Gastro-esophageal reflux disease without esophagitis: K21.9

## 2014-05-14 HISTORY — PX: CARPAL TUNNEL RELEASE: SHX101

## 2014-05-14 LAB — GLUCOSE, CAPILLARY
GLUCOSE-CAPILLARY: 226 mg/dL — AB (ref 70–99)
Glucose-Capillary: 251 mg/dL — ABNORMAL HIGH (ref 70–99)

## 2014-05-14 LAB — POCT HEMOGLOBIN-HEMACUE: HEMOGLOBIN: 10.5 g/dL — AB (ref 12.0–15.0)

## 2014-05-14 SURGERY — RELEASE, CARPAL TUNNEL, ENDOSCOPIC
Anesthesia: General | Site: Elbow | Laterality: Left

## 2014-05-14 MED ORDER — MIDAZOLAM HCL 2 MG/2ML IJ SOLN
INTRAMUSCULAR | Status: AC
  Filled 2014-05-14: qty 2

## 2014-05-14 MED ORDER — LACTATED RINGERS IV SOLN
INTRAVENOUS | Status: DC
  Administered 2014-05-14 (×2): via INTRAVENOUS

## 2014-05-14 MED ORDER — LIDOCAINE HCL (CARDIAC) 20 MG/ML IV SOLN
INTRAVENOUS | Status: DC | PRN
  Administered 2014-05-14: 50 mg via INTRAVENOUS

## 2014-05-14 MED ORDER — CLINDAMYCIN PHOSPHATE 900 MG/50ML IV SOLN
900.0000 mg | INTRAVENOUS | Status: DC
Start: 2014-05-14 — End: 2014-05-14

## 2014-05-14 MED ORDER — BUPIVACAINE-EPINEPHRINE (PF) 0.5% -1:200000 IJ SOLN
INTRAMUSCULAR | Status: AC
  Filled 2014-05-14: qty 30

## 2014-05-14 MED ORDER — CLINDAMYCIN PHOSPHATE 900 MG/50ML IV SOLN
INTRAVENOUS | Status: DC | PRN
  Administered 2014-05-14: 900 mg via INTRAVENOUS

## 2014-05-14 MED ORDER — LIDOCAINE HCL 2 % IJ SOLN
INTRAMUSCULAR | Status: AC
  Filled 2014-05-14: qty 20

## 2014-05-14 MED ORDER — CLINDAMYCIN PHOSPHATE 900 MG/50ML IV SOLN
INTRAVENOUS | Status: AC
  Filled 2014-05-14: qty 50

## 2014-05-14 MED ORDER — PROPOFOL 10 MG/ML IV BOLUS
INTRAVENOUS | Status: DC | PRN
  Administered 2014-05-14: 170 mg via INTRAVENOUS

## 2014-05-14 MED ORDER — ONDANSETRON HCL 4 MG/2ML IJ SOLN: 4.0000 mg | Freq: Once | INTRAMUSCULAR | Status: DC | PRN

## 2014-05-14 MED ORDER — ESMOLOL HCL 10 MG/ML IV SOLN
INTRAVENOUS | Status: DC | PRN
  Administered 2014-05-14: 10 ug via INTRAVENOUS

## 2014-05-14 MED ORDER — FENTANYL CITRATE 0.05 MG/ML IJ SOLN
INTRAMUSCULAR | Status: DC | PRN
  Administered 2014-05-14: 50 ug via INTRAVENOUS
  Administered 2014-05-14 (×2): 25 ug via INTRAVENOUS

## 2014-05-14 MED ORDER — FENTANYL CITRATE 0.05 MG/ML IJ SOLN
INTRAMUSCULAR | Status: AC
  Filled 2014-05-14: qty 2

## 2014-05-14 MED ORDER — MIDAZOLAM HCL 2 MG/2ML IJ SOLN
1.0000 mg | INTRAMUSCULAR | Status: DC | PRN
  Administered 2014-05-14: 2 mg via INTRAVENOUS

## 2014-05-14 MED ORDER — BUPIVACAINE HCL (PF) 0.25 % IJ SOLN
INTRAMUSCULAR | Status: AC
  Filled 2014-05-14: qty 30

## 2014-05-14 MED ORDER — HYDROCODONE-ACETAMINOPHEN 10-325 MG PO TABS: 1.0000 | ORAL_TABLET | ORAL | Status: DC | PRN

## 2014-05-14 MED ORDER — CHLORHEXIDINE GLUCONATE 4 % EX LIQD: 60.0000 mL | Freq: Once | CUTANEOUS | Status: DC

## 2014-05-14 MED ORDER — FENTANYL CITRATE 0.05 MG/ML IJ SOLN
50.0000 ug | INTRAMUSCULAR | Status: DC | PRN
  Administered 2014-05-14: 100 ug via INTRAVENOUS

## 2014-05-14 MED ORDER — PROPOFOL 10 MG/ML IV BOLUS
INTRAVENOUS | Status: AC
  Filled 2014-05-14: qty 100

## 2014-05-14 MED ORDER — MEPERIDINE HCL 25 MG/ML IJ SOLN: 6.2500 mg | INTRAMUSCULAR | Status: DC | PRN

## 2014-05-14 MED ORDER — HYDROMORPHONE HCL PF 1 MG/ML IJ SOLN: 0.2500 mg | INTRAMUSCULAR | Status: DC | PRN

## 2014-05-14 MED ORDER — ONDANSETRON HCL 4 MG/2ML IJ SOLN
INTRAMUSCULAR | Status: DC | PRN
  Administered 2014-05-14: 4 mg via INTRAVENOUS

## 2014-05-14 MED ORDER — LACTATED RINGERS IV SOLN
INTRAVENOUS | Status: DC
  Administered 2014-05-14: 07:00:00 via INTRAVENOUS

## 2014-05-14 MED ORDER — BUPIVACAINE-EPINEPHRINE (PF) 0.25% -1:200000 IJ SOLN
INTRAMUSCULAR | Status: AC
  Filled 2014-05-14: qty 30

## 2014-05-14 MED ORDER — OXYCODONE HCL 5 MG PO TABS: 5.0000 mg | ORAL_TABLET | Freq: Once | ORAL | Status: DC | PRN

## 2014-05-14 MED ORDER — OXYCODONE HCL 5 MG/5ML PO SOLN: 5.0000 mg | Freq: Once | ORAL | Status: DC | PRN

## 2014-05-14 MED ORDER — MIDAZOLAM HCL 5 MG/5ML IJ SOLN
INTRAMUSCULAR | Status: DC | PRN
  Administered 2014-05-14: 2 mg via INTRAVENOUS

## 2014-05-14 MED ORDER — LIDOCAINE HCL (PF) 1 % IJ SOLN
INTRAMUSCULAR | Status: AC
  Filled 2014-05-14: qty 30

## 2014-05-14 MED ORDER — LIDOCAINE HCL (CARDIAC) 20 MG/ML IV SOLN: INTRAVENOUS | Status: DC | PRN

## 2014-05-14 MED ORDER — FENTANYL CITRATE 0.05 MG/ML IJ SOLN
INTRAMUSCULAR | Status: AC
  Filled 2014-05-14: qty 6

## 2014-05-14 SURGICAL SUPPLY — 58 items
APPLICATOR COTTON TIP 6IN STRL (MISCELLANEOUS) IMPLANT
BENZOIN TINCTURE PRP APPL 2/3 (GAUZE/BANDAGES/DRESSINGS) ×4 IMPLANT
BLADE HOOK ENDO STRL (BLADE) ×4 IMPLANT
BLADE MINI RND TIP GREEN BEAV (BLADE) IMPLANT
BLADE SURG 15 STRL LF DISP TIS (BLADE) ×2 IMPLANT
BLADE SURG 15 STRL SS (BLADE) ×2
BLADE TRIANGLE EPF/EGR ENDO (BLADE) ×4 IMPLANT
BNDG COHESIVE 4X5 TAN STRL (GAUZE/BANDAGES/DRESSINGS) ×4 IMPLANT
BNDG ESMARK 4X9 LF (GAUZE/BANDAGES/DRESSINGS) ×4 IMPLANT
BNDG GAUZE ELAST 4 BULKY (GAUZE/BANDAGES/DRESSINGS) ×8 IMPLANT
CHLORAPREP W/TINT 26ML (MISCELLANEOUS) ×4 IMPLANT
CLOSURE WOUND 1/2 X4 (GAUZE/BANDAGES/DRESSINGS) ×1
CORDS BIPOLAR (ELECTRODE) ×4 IMPLANT
COVER MAYO STAND STRL (DRAPES) ×4 IMPLANT
COVER TABLE BACK 60X90 (DRAPES) ×4 IMPLANT
CUFF TOURNIQUET SINGLE 18IN (TOURNIQUET CUFF) ×4 IMPLANT
DRAIN PENROSE 1/2X12 LTX STRL (WOUND CARE) IMPLANT
DRAIN PENROSE 1/4X12 LTX STRL (WOUND CARE) IMPLANT
DRAIN TLS ROUND 10FR (DRAIN) ×4 IMPLANT
DRAPE EXTREMITY T 121X128X90 (DRAPE) ×4 IMPLANT
DRAPE SURG 17X23 STRL (DRAPES) IMPLANT
DRAPE U-SHAPE 47X51 STRL (DRAPES) ×4 IMPLANT
DRSG EMULSION OIL 3X3 NADH (GAUZE/BANDAGES/DRESSINGS) ×8 IMPLANT
GAUZE SPONGE 4X4 12PLY STRL (GAUZE/BANDAGES/DRESSINGS) ×4 IMPLANT
GLOVE BIO SURGEON STRL SZ7.5 (GLOVE) ×4 IMPLANT
GLOVE BIOGEL PI IND STRL 7.0 (GLOVE) ×2 IMPLANT
GLOVE BIOGEL PI IND STRL 8 (GLOVE) ×2 IMPLANT
GLOVE BIOGEL PI INDICATOR 7.0 (GLOVE) ×2
GLOVE BIOGEL PI INDICATOR 8 (GLOVE) ×2
GLOVE ECLIPSE 6.5 STRL STRAW (GLOVE) ×8 IMPLANT
GOWN STRL REUS W/ TWL LRG LVL3 (GOWN DISPOSABLE) ×4 IMPLANT
GOWN STRL REUS W/TWL LRG LVL3 (GOWN DISPOSABLE) ×4
LOOP VESSEL MAXI BLUE (MISCELLANEOUS) IMPLANT
NEEDLE HYPO 25X1 1.5 SAFETY (NEEDLE) IMPLANT
NS IRRIG 1000ML POUR BTL (IV SOLUTION) ×4 IMPLANT
PACK BASIN DAY SURGERY FS (CUSTOM PROCEDURE TRAY) ×4 IMPLANT
PADDING CAST ABS 4INX4YD NS (CAST SUPPLIES)
PADDING CAST ABS COTTON 4X4 ST (CAST SUPPLIES) IMPLANT
RUBBERBAND STERILE (MISCELLANEOUS) IMPLANT
SLING ARM LRG ADULT FOAM STRAP (SOFTGOODS) IMPLANT
SLING ARM MED ADULT FOAM STRAP (SOFTGOODS) IMPLANT
SLING ARM XL FOAM STRAP (SOFTGOODS) IMPLANT
STOCKINETTE 4X48 STRL (DRAPES) ×4 IMPLANT
STRIP CLOSURE SKIN 1/2X4 (GAUZE/BANDAGES/DRESSINGS) ×3 IMPLANT
SUT ETHILON 8 0 BV130 4 (SUTURE) IMPLANT
SUT VIC AB 0 SH 27 (SUTURE) IMPLANT
SUT VIC AB 2-0 SH 27 (SUTURE)
SUT VIC AB 2-0 SH 27XBRD (SUTURE) IMPLANT
SUT VIC AB 3-0 SH 27 (SUTURE) ×2
SUT VIC AB 3-0 SH 27X BRD (SUTURE) ×2 IMPLANT
SUT VICRYL RAPIDE 4-0 (SUTURE) ×4 IMPLANT
SUT VICRYL RAPIDE 4/0 PS 2 (SUTURE) IMPLANT
SYR BULB 3OZ (MISCELLANEOUS) ×4 IMPLANT
SYRINGE 10CC LL (SYRINGE) IMPLANT
SYSTEM CHEST DRAIN TLS 7FR (DRAIN) IMPLANT
TOWEL OR 17X24 6PK STRL BLUE (TOWEL DISPOSABLE) ×8 IMPLANT
TOWEL OR NON WOVEN STRL DISP B (DISPOSABLE) IMPLANT
UNDERPAD 30X30 INCONTINENT (UNDERPADS AND DIAPERS) ×4 IMPLANT

## 2014-05-14 NOTE — Progress Notes (Signed)
Assisted Dr. Ossey with left, ultrasound guided, supraclavicular block. Side rails up, monitors on throughout procedure. See vital signs in flow sheet. Tolerated Procedure well. 

## 2014-05-14 NOTE — Anesthesia Preprocedure Evaluation (Signed)
Anesthesia Evaluation  Patient identified by MRN, date of birth, ID band Patient awake    Reviewed: Allergy & Precautions, H&P , NPO status , Patient's Chart, lab work & pertinent test results  Airway Mallampati: I TM Distance: >3 FB Neck ROM: Full    Dental   Pulmonary former smoker,          Cardiovascular hypertension, Pt. on medications     Neuro/Psych    GI/Hepatic   Endo/Other  diabetes, Type 2, Insulin Dependent  Renal/GU      Musculoskeletal   Abdominal   Peds  Hematology   Anesthesia Other Findings   Reproductive/Obstetrics                           Anesthesia Physical Anesthesia Plan  ASA: II  Anesthesia Plan: General   Post-op Pain Management:    Induction: Intravenous  Airway Management Planned: LMA  Additional Equipment:   Intra-op Plan:   Post-operative Plan: Extubation in OR  Informed Consent: I have reviewed the patients History and Physical, chart, labs and discussed the procedure including the risks, benefits and alternatives for the proposed anesthesia with the patient or authorized representative who has indicated his/her understanding and acceptance.     Plan Discussed with: CRNA and Surgeon  Anesthesia Plan Comments:         Anesthesia Quick Evaluation

## 2014-05-14 NOTE — Anesthesia Postprocedure Evaluation (Signed)
Anesthesia Post Note  Patient: [name]  Procedure(s) Performed: Procedure(s) (LRB): LEFT ULNAR NEUROPLASTY AT ELBOW AND ENDOSCOPIC CARPAL TUNNEL RELEASE (Left) ULNAR NERVE DECOMPRESSION/TRANSPOSITION (Left)  Anesthesia type: general  Patient location: PACU  Post pain: Pain level controlled  Post assessment: Patient's Cardiovascular Status Stable  Last Vitals:  Filed Vitals:   05/14/14 1025  BP: 141/79  Pulse: 89  Temp: 36.6 C  Resp: 16    Post vital signs: Reviewed and stable  Level of consciousness: sedated  Complications: No apparent anesthesia complications

## 2014-05-14 NOTE — Discharge Instructions (Addendum)
Discharge Instructions   You have a light dressing on your hand.  You may begin gentle motion of your fingers and hand immediately, but you should not do any heavy lifting or gripping.  Elevate your hand to reduce pain & swelling of the digits.  Ice over the operative site may be helpful to reduce pain & swelling.  DO NOT USE HEAT. Pain medicine has been prescribed for you.  Use your medicine as needed over the first 48 hours, and then you can begin to taper your use. You may use Tylenol in place of your prescribed pain medication, but not IN ADDITION to it. Leave the dressing in place until the third day after your surgery and then remove it, leaving it open to air.  After the bandage has been removed you may shower, but do not soak the incision.  You may drive a car when you are off of prescription pain medications and can safely control your vehicle with both hands. We will address whether therapy will be required or not when you return to the office. You may have already made your follow-up appointment when we completed your preop visit.  If not, please call our office today or the next business day to make your return appointment for 7-10 days after surgery.   Please call [phone] during normal business hours or [phone] after hours for any problems. Including the following:  - excessive redness of the incisions - drainage for more than 4 days - fever of more than 101.5 F  *Please note that pain medications will not be refilled after hours or on weekends.   TLS Drain Instructions You have a drain tube in place to help limit the amount of blood that collects under your skin in the early post-operative period.  The amount it drains will vary from person-to-person and is dependent upon many factors.  You will have 1-2 extra drain tubes sent with you from the facility.  You should change the tube according to these instructions below when the tube is about half full.  BE SURE TO  SLIDE THE CLAMP TO THE CLOSED POSITION BEFORE CHANGING THE TUBE.    RE-OPEN THE CLAMP ONCE THE GLASS EVACUATION TUBE HAS BEEN CHANGED.  24 hours after surgery the amount of drainage will likely be negligible and it will be time to remove the tube and discard it.  Simply remove the tape that secures the flexible plastic drainage tube to the bandage and briskly pull the tube straight out.  You will likely feel a little discomfort during this process, but the tube should slide out as it is not sewn or otherwise secured directly to your body.  It is merely held in place by the bandage itself.                              If you are not clear about when your first post-op appointment is scheduled, please call the office on the next business day to inquire about it.  Please also call the office if you have any other questions [phone]).     Post Anesthesia Home Care Instructions  Activity: Get plenty of rest for the remainder of the day. A responsible adult should stay with you for 24 hours following the procedure.  For the next 24 hours, DO NOT: -Drive a car -Paediatric nurse -Drink alcoholic beverages -Take any medication unless instructed by your physician -Make any legal decisions  or sign important papers.  Meals: Start with liquid foods such as gelatin or soup. Progress to regular foods as tolerated. Avoid greasy, spicy, heavy foods. If nausea and/or vomiting occur, drink only clear liquids until the nausea and/or vomiting subsides. Call your physician if vomiting continues.  Special Instructions/Symptoms: Your throat may feel dry or sore from the anesthesia or the breathing tube placed in your throat during surgery. If this causes discomfort, gargle with warm salt water. The discomfort should disappear within 24 hours.   Call your surgeon if you experience:   1.  Fever over 101.0. 2.  Inability to urinate. 3.  Nausea and/or vomiting. 4.  Extreme swelling or bruising at the  surgical site. 5.  Continued bleeding from the incision. 6.  Increased pain, redness or drainage from the incision. 7.  Problems related to your pain medication.   TLS Drain Instructions You have a drain tube in place to help limit the amount of blood that collects under your skin in the early post-operative period.  The amount it drains will vary from person-to-person and is dependent upon many factors.  You will have 1-2 extra drain tubes sent with you from the facility.  You should change the tube according to these instructions below when the tube is about half full.  BE SURE TO SLIDE THE CLAMP TO THE CLOSED POSITION BEFORE CHANGING THE TUBE.    RE-OPEN THE CLAMP ONCE THE GLASS EVACUATION TUBE HAS BEEN CHANGED.  24 hours after surgery the amount of drainage will likely be negligible and it will be time to remove the tube and discard it.  Simply remove the tape that secures the flexible plastic drainage tube to the bandage and briskly pull the tube straight out.  You will likely feel a little discomfort during this process, but the tube should slide out as it is not sewn or otherwise secured directly to your body.  It is merely held in place by the bandage itself.                              If you are not clear about when your first post-op appointment is scheduled, please call the office on the next business day to inquire about it.  Please also call the office if you have any other questions [phone]).     Regional Anesthesia Blocks  1. Numbness or the inability to move the "blocked" extremity may last from 3-48 hours after placement. The length of time depends on the medication injected and your individual response to the medication. If the numbness is not going away after 48 hours, call your surgeon.  2. The extremity that is blocked will need to be protected until the numbness is gone and the  Strength has returned. Because you cannot feel it, you will need to take extra care to  avoid injury. Because it may be weak, you may have difficulty moving it or using it. You may not know what position it is in without looking at it while the block is in effect.  3. For blocks in the legs and feet, returning to weight bearing and walking needs to be done carefully. You will need to wait until the numbness is entirely gone and the strength has returned. You should be able to move your leg and foot normally before you try and bear weight or walk. You will need someone to be with you when you first try  to ensure you do not fall and possibly risk injury.  4. Bruising and tenderness at the needle site are common side effects and will resolve in a few days.  5. Persistent numbness or new problems with movement should be communicated to the surgeon or the [facility] [phone] [facility] [phone]).

## 2014-05-14 NOTE — Transfer of Care (Signed)
Immediate Anesthesia Transfer of Care Note  Patient: [name]  Procedure(s) Performed: Procedure(s): LEFT ULNAR NEUROPLASTY AT ELBOW AND ENDOSCOPIC CARPAL TUNNEL RELEASE (Left) ULNAR NERVE DECOMPRESSION/TRANSPOSITION (Left)  Patient Location: PACU  Anesthesia Type:GA combined with regional for post-op pain  Level of Consciousness: awake, sedated and patient cooperative  Airway & Oxygen Therapy: Patient Spontanous Breathing and Patient connected to face mask oxygen  Post-op Assessment: Report given to PACU RN and Post -op Vital signs reviewed and stable  Post vital signs: Reviewed and stable  Complications: No apparent anesthesia complications

## 2014-05-14 NOTE — Anesthesia Procedure Notes (Addendum)
Anesthesia Regional Block:  Supraclavicular block  Pre-Anesthetic Checklist: ,, timeout performed, Correct Patient, Correct Site, Correct Laterality, Correct Procedure, Correct Position, site marked, Risks and benefits discussed,  Surgical consent,  Pre-op evaluation,  At surgeon's request and post-op pain management  Laterality: Left  Prep: chloraprep       Needles:   Needle Type: Echogenic Stimulator Needle     Needle Length: 9cm 9 cm Needle Gauge: 21 and 21 G    Additional Needles:  Procedures: ultrasound guided (picture in chart) and nerve stimulator Supraclavicular block  Nerve Stimulator or Paresthesia:  Response: 0.4 mA,   Additional Responses:   Narrative:  Start time: 05/14/2014 7:15 AM End time: 05/14/2014 7:23 AM Injection made incrementally with aspirations every 5 mL.  Performed by: Personally  Anesthesiologist: Lillia Abed MD  Additional Notes: Monitors applied. Patient sedated. Sterile prep and drape,hand hygiene and sterile gloves were used. Relevant anatomy identified.Needle position confirmed.Local anesthetic injected incrementally after negative aspiration. Local anesthetic spread visualized around nerve(s). Vascular puncture avoided. No complications. Image printed for medical record.The patient tolerated the procedure well.        Procedure Name: LMA Insertion Date/Time: 05/14/2014 7:56 AM Performed by: Toula Moos L Pre-anesthesia Checklist: Patient identified, Emergency Drugs available, Suction available, Patient being monitored and Timeout performed Patient Re-evaluated:Patient Re-evaluated prior to inductionOxygen Delivery Method: Circle System Utilized Preoxygenation: Pre-oxygenation with 100% oxygen Intubation Type: IV induction Ventilation: Mask ventilation without difficulty LMA: LMA inserted LMA Size: 4.0 Number of attempts: 1 Airway Equipment and Method: bite block Placement Confirmation: positive ETCO2 and breath sounds checked-  equal and bilateral Tube secured with: Tape Dental Injury: Teeth and Oropharynx as per pre-operative assessment

## 2014-05-14 NOTE — Op Note (Signed)
05/14/2014  7:42 AM  PATIENT:  [name]  53 y.o. female  PRE-OPERATIVE DIAGNOSIS:  LEFT ULNAR AND MEDIAN NEUROPATHY   POST-OPERATIVE DIAGNOSIS:  Same  PROCEDURE:  Procedure(s): LEFT ULNAR NEUROPLASTY AT ELBOW (DECOMPRESSION IN SITU) WITH EXCISION OF ANCONEUS EPITROCHLEARIS AND ENDOSCOPIC CARPAL TUNNEL RELEASE  SURGEON:  Surgeon(s): Jolyn Nap, MD  PHYSICIAN ASSISTANT: None  ANESTHESIA:  Gen. with preoperative block  SPECIMENS:  None  DRAINS:   TLS x1  PREOPERATIVE INDICATIONS:  [name] is a  53 y.o. female with a diagnosis of LEFT ULNAR AND MEDIAN NEUROPATHY  who failed conservative measures and elected for surgical management.    The risks benefits and alternatives were discussed with the patient preoperatively including but not limited to the risks of infection, bleeding, nerve injury, cardiopulmonary complications, the need for revision surgery, among others, and the patient verbalized understanding and consented to proceed.  OPERATIVE IMPLANTS: None  OPERATIVE FINDINGS: Tight carpal tunnel, acceptably decompressed following release;  ulnar nerve visibly compressed with hourglass narrowing at the level of Anconeus epitrochlearis  OPERATIVE PROCEDURE:  After receiving prophylactic antibiotics and a preoperative regional block, the patient was escorted to the operative theatre and placed in a supine position. General anesthesia was administered.  A surgical "time-out" was performed during which the planned procedure, proposed operative site, and the correct patient identity were compared to the operative consent and agreement confirmed by the circulating nurse according to current facility policy.  Following application of a tourniquet to the operative extremity, the planned incisions were marked and anesthetized with a mixture of lidocaine & bupivicaine containing epinephrine.  The exposed skin was prepped with Chloraprep and draped in the usual sterile fashion.   The limb was exsanguinated with an Esmarch bandage and the tourniquet inflated to approximately 159mmHg higher than systolic BP.   The incisions were made sharply. Subcutaneous tissues were dissected with blunt and spreading dissection. At the proximal incision, the deep forearm fascia was split in line with the skin incision the distal edge grasped with a hemostat. At the mid palmar incision, the palmar fascia was split in line with the skin incision, revealing the underlying superficial palmar arch which was visualized with loupe assisted magnification. The synovial reflector was then introduced into the proximal incision and passed through the carpal canal, uses to reflect synovium from the deep surface of the transverse carpal ligament. It was removed and replaced with the slotted cannula and blunt obturator, passing from proximal to distal and exiting the distal wound superficial to the superficial palmar arch. The obturator was removed and the camera inserted. Visualization of the ligament was acceptable. The triangle shape blade was then inserted distally, advanced to the midportion of the ligament, and there used to create a perforation in the ligament. This instrument was removed and the hooked nstrument was inserted. It was placed into the perforation in the ligament and withdrawn distally, completing transection of the distal half of the ligament. The camera was then removed and placed into the distal end of the cannula. The hooked instrument was placed into the proximal end and advanced facility to be placed into the apex of the V. which had been formed to the distal hemi-transection of the ligament. It was withdrawn proximally, completing transection of the ligament. The adequacy of the release was judged with the scope and the instruments used as a probe. All of the endoscopic instruments are removed and the adequacy of release was again judged from the proximal incision  perspective with direct loupe  assisted visualization. In addition, the proximal forearm fascia was split for 2 inches proximal to the proximal incision under direct visualization using a sliding scissor technique.  Attention was then directed to the ulnar nerve, where a curvilinear incision was made over the course of the nerve at the level of the elbow. It was centered between the prominence of the medial epicondyles and the olecranon. The skin was incised sharply with a scalpel subcutaneous tissues were dissected with blunt spreading dissection. Care was taken to protect and preserve crossing subcutaneous structures. The anconeus epitrochlearis was identified and excised. The ulnar nerve was then exposed at the same level and found to have narrowing and irritability.  The nerve was fully decompressed for 10 cm proximal to this, and distally into the FCU were both the superficial and deep fascia was split protecting branches to the FCU. The elbow was flexed and extended and the nerve found to be stable.   The tourniquet was released, the wounds copiously irrigated. A TLS drain was placed to lie alongside the nerve exiting its own skin hole distally made with some sharp trocar. The skin was then closed with 4-0 Vicryl Rapide interrupted sutures and the hand, the distal or simple superficial, the proximal were buried. At the elbow, the suture was used to place 3 buried subcuticular sutures followed by running subcuticular suture with benzoin and Steri-Strips.  A light dressing was applied and she was awakened and taken to the recovery room stable condition, breathing spontaneously  DISPOSITION:  The patient will be discharged home today, returning in 7-10 days for re-assessment.

## 2014-05-14 NOTE — Interval H&P Note (Signed)
History and Physical Interval Note:  05/14/2014 7:42 AM  [name]  has presented today for surgery, with the diagnosis of LEFT ULNAR AND MEDIAN NEUROPATHY   The various methods of treatment have been discussed with the patient and family. After consideration of risks, benefits and other options for treatment, the patient has consented to  Procedure(s): LEFT ULNAR NEUROPLASTY AT ELBOW AND ENDOSCOPIC CARPAL TUNNEL RELEASE (Left) ULNAR NERVE DECOMPRESSION/TRANSPOSITION (Left) as a surgical intervention .  The patient's history has been reviewed, patient examined, no change in status, stable for surgery.  I have reviewed the patient's chart and labs.  Questions were answered to the patient's satisfaction.     Armanda Forand A.

## 2014-05-15 ENCOUNTER — Encounter (HOSPITAL_BASED_OUTPATIENT_CLINIC_OR_DEPARTMENT_OTHER): Payer: Self-pay | Admitting: Orthopedic Surgery

## 2014-05-25 ENCOUNTER — Encounter: Payer: Self-pay | Admitting: Internal Medicine

## 2014-06-15 ENCOUNTER — Other Ambulatory Visit: Payer: Self-pay | Admitting: Internal Medicine

## 2014-06-25 ENCOUNTER — Ambulatory Visit: Payer: BC Managed Care – PPO | Admitting: Internal Medicine

## 2014-07-06 ENCOUNTER — Ambulatory Visit (INDEPENDENT_AMBULATORY_CARE_PROVIDER_SITE_OTHER): Payer: BC Managed Care – PPO | Admitting: Internal Medicine

## 2014-07-06 ENCOUNTER — Encounter: Payer: Self-pay | Admitting: Internal Medicine

## 2014-07-06 VITALS — BP 130/88 | HR 100 | Temp 98.4°F | Resp 12 | Wt 177.0 lb

## 2014-07-06 DIAGNOSIS — E1165 Type 2 diabetes mellitus with hyperglycemia: Principal | ICD-10-CM

## 2014-07-06 DIAGNOSIS — B379 Candidiasis, unspecified: Secondary | ICD-10-CM

## 2014-07-06 DIAGNOSIS — IMO0001 Reserved for inherently not codable concepts without codable children: Secondary | ICD-10-CM

## 2014-07-06 LAB — HEMOGLOBIN A1C: Hgb A1c MFr Bld: 10.3 % — ABNORMAL HIGH (ref 4.6–6.5)

## 2014-07-06 MED ORDER — FLUCONAZOLE 150 MG PO TABS: 150.0000 mg | ORAL_TABLET | Freq: Once | ORAL | Status: DC

## 2014-07-06 NOTE — Progress Notes (Signed)
Patient ID: [name], female   DOB: 1961-03-01, 53 y.o.   MRN: [id number]  HPI: [name] is a 53 y.o.-year-old female, initially referred by her PCP, Dr.Tullo, for management of DM2, dx ~2000, insulin-dependent since 2010, uncontrolled, with complications (PN, + DR), now returning for f/u for DM2. Last visit 3 mo ago.  She has L carpal tunnel sd. >> brace  Last hemoglobin A1c was: Lab Results  Component Value Date   HGBA1C 7.0* 03/26/2014   HGBA1C 9.5* 12/26/2013   HGBA1C 7.5* 09/20/2013  Had cortisone inj in wrists in 10/2013.  Pt is on a regimen of: - Metformin 1000 mg po bid  - Humulin 70/30 injects 30 units >> 15 units 30 min before breakfast and 50 units >> stopped 30 min before dinner. She did not have money to get her insulin as she was out of work. We stopped Glipizide 10 mg bid.  Pt checks her sugars 2-3x a day and they are - reviewed log - sugars greatly increased in the last 4 days: - am: 63-274 >> 52-218, usu. 70-140 >> 58-249 (most low 100s) >> 200-300s >> 146-211 >> 85-209 (110-180) >> 86-209, but 302, 341, 542 in last 3 days - 2h after b'fast: 120-195 >> 154 - lunch: n/c >> 85, 148, 445 - mid-afternoon: 63-146 >> 78-174 >> 51-209 (100-170) >> 135-225 - before dinner: 76-200 >> not checking >> n/c >> 114-200 >> 59-183 >> 51x1, 91-160, 296 x1 >> 113-300s in last 2 days - bedtime: 170-190s >> 301 (forgot insulin with that meal) >> cannot remember (79 >> 170s) >> 87-269  >> 82-167 >> 87-196 Lowest sugar was 86; she has hypoglycemia awareness at 80. Highest sugar was 542!  - no CKD, last BUN/creatinine:  Lab Results  Component Value Date   BUN 12 05/11/2014   CREATININE 0.66 05/11/2014  Last ACR 0.5 06/19/13. Not on ACEI/ARB.  - last set of lipids: Lab Results  Component Value Date   CHOL 162 06/19/2013   HDL 39.60 06/19/2013   LDLCALC 96 06/19/2013   LDLDIRECT 89.7 11/20/2011   TRIG 132.0 06/19/2013   CHOLHDL 4 06/19/2013  She is on Lipitor.   - last eye  exam was in 02/2014. + DR L>R - Dr. Ricki Miller.  - + numbness and tingling in her feet.   She also has a history of hypertension, hyperlipidemia-on Lipitor, episodic alcohol abuse, GERD, low back pain, osteoarthritis.   ROS: Constitutional: + weight loss, no fatigue, no subjective hyperthermia/hypothermia, + nocturia, + poor sleep Eyes: + blurry vision, no xerophthalmia ENT: no sore throat, no nodules palpated in throat, no dysphagia/odynophagia, no hoarseness Cardiovascular: no CP/SOB/palpitations/leg swelling. She c/o leg cramps. Respiratory: no cough/SOB Gastrointestinal: no N/V/D/C Musculoskeletal: + muscle/+ joint aches Skin: no rashes Neurological: no tremors/numbness/tingling/dizziness  I reviewed pt's medications, allergies, PMH, social hx, family hx and no changes required, except as mentioned above.  PE: BP 130/88  Pulse 100  Temp(Src) 98.4 F (36.9 C) (Oral)  Resp 12  Wt 177 lb (80.287 kg)  SpO2 97% Wt Readings from Last 3 Encounters:  07/06/14 177 lb (80.287 kg)  05/09/14 188 lb (85.276 kg)  05/09/14 188 lb (85.276 kg)   Constitutional: overweight, in NAD Eyes: PERRLA, EOMI, no exophthalmos ENT: moist mucous membranes, no thyromegaly, no cervical lymphadenopathy Cardiovascular: RRR, No MRG Respiratory: CTA B Gastrointestinal: abdomen soft, NT, ND, BS+ Musculoskeletal: no deformities, strength intact in all 4 Skin: moist, warm Neurological: no tremor with outstretched hands, DTR normal in  all 4  ASSESSMENT: 1. DM2, insulin-dependent, uncontrolled, with complications - peripheral neuropathy - DR   2. Yeast vaginitis  PLAN:  1. Patient with long-standing,uncontrolled diabetes, on a premixed insulin regimen (limited finances). She had improved control with a HbA1c of 7% at last visit, but since then, she reduced the Total daily dose of insulin from 80 units to 15 units >> control deteriorated. She was out of work but will now return to work initially part  time. She will go today to get 1 bottle of insulin ($20) - I advised her to: Patient Instructions  Please try to restart on the previous insulin doses: Continue insulin 70/30 30 units 30 min before breakfast and 25 units 30 min before dinner. Increase to 50 units before dinner after 3 days, if sugars still high. Continue Metformin 1000 mg 2x a day. Please return in 1.5 months with your sugar log.  Please stop at the lab. - continue checking her sugars at different times of the day - check 3 times a day, rotating checks  - she is up-to-date with eye exams - will check A1c today - Return to clinic in 1.5 mo with sugar log   2. Yeast vaginitis - sent Rx for diflucan  Office Visit on 07/06/2014  Component Date Value Ref Range Status  . Hemoglobin A1C 07/06/2014 10.3* 4.6 - 6.5 % Final   Glycemic Control Guidelines for People with Diabetes:Non Diabetic:  <6%Goal of Therapy: <7%Additional Action Suggested:  >8%   HbA1c greatly increased, as expected.

## 2014-07-06 NOTE — Patient Instructions (Addendum)
Please try to restart on the previous insulin doses: Continue insulin 70/30 30 units 30 min before breakfast and 25 units 30 min before dinner. Increase to 50 units before dinner after 3 days, if sugars still high. Continue Metformin 1000 mg 2x a day. Please return in 1.5 months with your sugar log.  Please stop at the lab.

## 2014-07-08 ENCOUNTER — Other Ambulatory Visit: Payer: Self-pay | Admitting: Internal Medicine

## 2014-07-13 ENCOUNTER — Telehealth: Payer: Self-pay | Admitting: *Deleted

## 2014-07-13 DIAGNOSIS — E1165 Type 2 diabetes mellitus with hyperglycemia: Principal | ICD-10-CM

## 2014-07-13 DIAGNOSIS — IMO0001 Reserved for inherently not codable concepts without codable children: Secondary | ICD-10-CM

## 2014-07-13 NOTE — Telephone Encounter (Signed)
Pt left message requesting Hydrocodone refill.  Last refill 6.15.15.  Last OV 10.22.14.  Please advise refill

## 2014-07-13 NOTE — Telephone Encounter (Signed)
Refill denied until seen , she has diabetes and should be seen every 3 months so she is quite overdue,  Needs fasting labs prior to OV

## 2014-07-16 ENCOUNTER — Other Ambulatory Visit: Payer: Self-pay | Admitting: Internal Medicine

## 2014-07-17 NOTE — Telephone Encounter (Signed)
Spoke with pt, appoint scheduled for 9.1.15 at 2:45pm.  Please order fasting labs.

## 2014-07-17 NOTE — Addendum Note (Signed)
Addended by: Crecencio Mc on: 07/17/2014 10:47 AM   Modules accepted: Orders

## 2014-07-30 ENCOUNTER — Other Ambulatory Visit (INDEPENDENT_AMBULATORY_CARE_PROVIDER_SITE_OTHER): Payer: BC Managed Care – PPO

## 2014-07-30 DIAGNOSIS — IMO0001 Reserved for inherently not codable concepts without codable children: Secondary | ICD-10-CM

## 2014-07-30 DIAGNOSIS — E1165 Type 2 diabetes mellitus with hyperglycemia: Principal | ICD-10-CM

## 2014-07-30 LAB — COMPREHENSIVE METABOLIC PANEL
ALT: 13 U/L (ref 0–35)
AST: 15 U/L (ref 0–37)
Albumin: 3.8 g/dL (ref 3.5–5.2)
Alkaline Phosphatase: 92 U/L (ref 39–117)
BILIRUBIN TOTAL: 0.4 mg/dL (ref 0.2–1.2)
BUN: 13 mg/dL (ref 6–23)
CALCIUM: 9.2 mg/dL (ref 8.4–10.5)
CO2: 23 mEq/L (ref 19–32)
CREATININE: 0.6 mg/dL (ref 0.4–1.2)
Chloride: 107 mEq/L (ref 96–112)
GFR: 136.34 mL/min (ref >60.00)
Glucose, Bld: 289 mg/dL — ABNORMAL HIGH (ref 70–99)
POTASSIUM: 4.1 meq/L (ref 3.5–5.1)
SODIUM: 137 meq/L (ref 135–145)
Total Protein: 7.5 g/dL (ref 6.0–8.3)

## 2014-07-30 LAB — LIPID PANEL
CHOLESTEROL: 204 mg/dL — AB (ref 0–200)
HDL: 39.8 mg/dL (ref >39.00)
LDL Cholesterol: 146 mg/dL — ABNORMAL HIGH (ref 0–99)
NONHDL: 164.2
TRIGLYCERIDES: 92 mg/dL (ref 0.0–149.0)
Total CHOL/HDL Ratio: 5
VLDL: 18.4 mg/dL (ref 0.0–40.0)

## 2014-07-30 LAB — MICROALBUMIN / CREATININE URINE RATIO
CREATININE, U: 154.4 mg/dL
MICROALB UR: 0.4 mg/dL (ref 0.0–1.9)
MICROALB/CREAT RATIO: 0.3 mg/g (ref 0.0–30.0)

## 2014-07-31 ENCOUNTER — Encounter: Payer: Self-pay | Admitting: Internal Medicine

## 2014-07-31 ENCOUNTER — Ambulatory Visit (INDEPENDENT_AMBULATORY_CARE_PROVIDER_SITE_OTHER): Payer: BC Managed Care – PPO | Admitting: Internal Medicine

## 2014-07-31 VITALS — BP 146/90 | HR 95 | Temp 98.1°F | Resp 18 | Ht 64.0 in | Wt 179.0 lb

## 2014-07-31 DIAGNOSIS — Z113 Encounter for screening for infections with a predominantly sexual mode of transmission: Secondary | ICD-10-CM

## 2014-07-31 DIAGNOSIS — M545 Low back pain, unspecified: Secondary | ICD-10-CM

## 2014-07-31 DIAGNOSIS — A6000 Herpesviral infection of urogenital system, unspecified: Secondary | ICD-10-CM

## 2014-07-31 MED ORDER — HYDROCODONE-ACETAMINOPHEN 10-325 MG PO TABS: 1.0000 | ORAL_TABLET | Freq: Every evening | ORAL | Status: DC | PRN

## 2014-07-31 MED ORDER — HYDROCODONE-ACETAMINOPHEN 10-325 MG PO TABS
1.0000 | ORAL_TABLET | Freq: Every evening | ORAL | Status: DC | PRN
Start: 2014-07-31 — End: 2014-07-31

## 2014-07-31 NOTE — Assessment & Plan Note (Addendum)
Recently diagnosed by [facility].  Screening was done today for other STDS and negative.  Her IgG is positive I explained that the outbreak does not necessarily mean recent infection but may have been activated by loss of glycemic control .  Was prescribed acyclovir daily but has not picked it up yet.  15 yr relationship in jeopardy due to suspicion of infidelity

## 2014-07-31 NOTE — Progress Notes (Signed)
Patient ID: [name], female   DOB: 07-26-1961, 53 y.o.   MRN: [id number]   Patient Active Problem List   Diagnosis Date Noted  . Genital herpes 07/31/2014  . Furuncle of abdominal wall 09/21/2013  . Bilateral hand numbness 09/21/2013  . Noncompliance with diet and medication regimen 11/15/2012  . Tenosynovitis of thumb 01/02/2012  . Arthritis   . Screening for breast cancer 08/04/2011  . Screening for colon cancer 08/04/2011  . HIDRADENITIS SUPPURATIVA 04/11/2007  . GERD 11/17/2006  . ENDOMETRIAL POLYP 11/17/2006  . LOW BACK PAIN 11/17/2006  . FIBROIDS, UTERUS 11/16/2006  . Diabetes mellitus type 2, uncontrolled, without complications 70/35/0093  . DYSLIPIDEMIA 11/16/2006  . ALCOHOL ABUSE, EPISODIC 11/16/2006  . HYPERTENSION 11/16/2006    Subjective:  CC:   Chief Complaint  Patient presents with  . Follow-up    medication refills    HPI:   [name] is a 53 y.o. female who presents for   Past Medical History  Diagnosis Date  . Hyperlipidemia   . Diabetes mellitus   . Gastritis with bleeding due to alcohol 2010    [facility] admission  . Campylobacter enteritis May 2012    [facility] admission  . Arthritis   . Carpal tunnel syndrome, bilateral   . Hypertension     no meds  . Wears glasses   . GERD (gastroesophageal reflux disease)     Past Surgical History  Procedure Laterality Date  . Endometrial biopsy  2004    benign  . Tonsillectomy    . Foot osteotomy  2010    right-spurs  . Cyst excision  1995    under arms  . Carpal tunnel release Left 05/14/2014    Procedure: LEFT ULNAR NEUROPLASTY AT ELBOW AND ENDOSCOPIC CARPAL TUNNEL RELEASE;  Surgeon: Jolyn Nap, MD;  Location: [place];  Service: Orthopedics;  Laterality: Left;  . Ulnar nerve transposition Left 05/14/2014    Procedure: ULNAR NERVE DECOMPRESSION/TRANSPOSITION;  Surgeon: Jolyn Nap, MD;  Location: [place];  Service: Orthopedics;  Laterality: Left;        The following portions of the patient's history were reviewed and updated as appropriate: Allergies, current medications, and problem list.    Review of Systems:   Patient denies headache, fevers, malaise, unintentional weight loss, skin rash, eye pain, sinus congestion and sinus pain, sore throat, dysphagia,  hemoptysis , cough, dyspnea, wheezing, chest pain, palpitations, orthopnea, edema, abdominal pain, nausea, melena, diarrhea, constipation, flank pain, dysuria, hematuria, urinary  Frequency, nocturia, numbness, tingling, seizures,  Focal weakness, Loss of consciousness,  Tremor, insomnia, depression, anxiety, and suicidal ideation.     History   Social History  . Marital Status: Single    Spouse Name: N/A    Number of Children: N/A  . Years of Education: N/A   Occupational History  . Not on file.   Social History Main Topics  . Smoking status: Former Smoker    Quit date: 05/09/2012  . Smokeless tobacco: Never Used  . Alcohol Use: No     Comment: not for 5 yr  . Drug Use: No  . Sexual Activity: Not on file   Other Topics Concern  . 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   She lives at home with daughter and grandson.    Objective:  Filed Vitals:   07/31/14 1505  BP: 146/90  Pulse: 95  Temp:  98.1 F (36.7 C)  Resp: 18     General appearance: alert, cooperative and appears stated age Ears: normal TM's and external ear canals both ears Throat: lips, mucosa, and tongue normal; teeth and gums normal Neck: no adenopathy, no carotid bruit, supple, symmetrical, trachea midline and thyroid not enlarged, symmetric, no tenderness/mass/nodules Back: symmetric, no curvature. ROM normal. No CVA tenderness. Lungs: clear to auscultation bilaterally Heart: regular rate and rhythm, S1, S2 normal, no murmur, click, rub or gallop Abdomen: soft, non-tender; bowel sounds normal; no masses,  no organomegaly Pulses: 2+ and  symmetric Skin: Skin color, texture, turgor normal. No rashes or lesions Lymph nodes: Cervical, supraclavicular, and axillary nodes normal.  Assessment and Plan:  Genital herpes Recently diagnosed by [facility].  Screening was done today for other STDS and negative.  Her IgG is positive I explained that the outbreak does not necessarily mean recent infection but may have been activated by loss of glycemic control .  Was prescribed acyclovir daily but has not picked it up yet.  15 yr relationship in jeopardy due to suspicion of infidelity   LOW BACK PAIN She has apparently been advised by [facility] orthopedics that she needs lumbar surgery. Her back surgery has been postponed until her diabetes is better controlled.  . I will refill her pain medication today.   A total of 25 minutes of face to face time was spent with patient more than half of which was spent in counselling and coordination of care    Updated Medication List Outpatient Encounter Prescriptions as of 07/31/2014  Medication Sig  . atorvastatin (LIPITOR) 40 MG tablet TAKE ONE TABLET BY MOUTH ONCE DAILY  . BAYER CONTOUR TEST test strip TEST BLOOD SUGARS 3 TIMES DAILY  . etodolac (LODINE) 400 MG tablet Take 400 mg by mouth daily.   Marland Kitchen gabapentin (NEURONTIN) 300 MG capsule   . HYDROcodone-acetaminophen (NORCO) 10-325 MG per tablet Take 1 tablet by mouth at bedtime as needed and may repeat dose one time if needed for severe pain.  . hydrocortisone (ANUSOL-HC) 25 MG suppository Place 1 suppository (25 mg total) rectally 2 (two) times daily.  . insulin NPH-regular (NOVOLIN 70/30) (70-30) 100 UNIT/ML injection Inject 30 units 30 min before breakfast and 50 units 30 min before dinner  . lansoprazole (PREVACID) 30 MG capsule Take 1 capsule (30 mg total) by mouth daily at 12 noon.  . medroxyPROGESTERone (DEPO-PROVERA) 150 MG/ML injection Inject 150 mg into the muscle every 3 (three) months.    . metFORMIN (GLUCOPHAGE) 1000 MG tablet  TAKE ONE TABLET BY MOUTH TWICE DAILY WITH MEALS  . [DISCONTINUED] HYDROcodone-acetaminophen (NORCO) 10-325 MG per tablet Take 1 tablet by mouth every 4 (four) hours as needed for severe pain.  . [DISCONTINUED] HYDROcodone-acetaminophen (NORCO) 10-325 MG per tablet Take 1 tablet by mouth at bedtime as needed and may repeat dose one time if needed for severe pain.  . [DISCONTINUED] HYDROcodone-acetaminophen (NORCO) 10-325 MG per tablet Take 1 tablet by mouth at bedtime as needed and may repeat dose one time if needed for severe pain.  . [DISCONTINUED] omeprazole (PRILOSEC) 40 MG capsule TAKE ONE CAPSULE BY MOUTH ONCE DAILY **NEEDS OFFICE VISIT PRIOR TO FURTHER REFILLS, PLEASE CALL OFFICE TO SCHEDULE AS SOON AS POSSIBLE**  . [DISCONTINUED] fluconazole (DIFLUCAN) 150 MG tablet Take 1 tablet (150 mg total) by mouth once.

## 2014-07-31 NOTE — Patient Instructions (Signed)
I am testing you for other infections today based on your recent diagnosis  Please start the acyclovir that your other doctor prescribed   You may return in 3 months for refills on your vicodin

## 2014-08-01 ENCOUNTER — Other Ambulatory Visit: Payer: Self-pay | Admitting: Internal Medicine

## 2014-08-01 LAB — HSV(HERPES SIMPLEX VRS) I + II AB-IGG
HSV 1 Glycoprotein G Ab, IgG: 2.83 IV — ABNORMAL HIGH
HSV 2 Glycoprotein G Ab, IgG: 6.69 IV — ABNORMAL HIGH

## 2014-08-01 LAB — HIV ANTIBODY (ROUTINE TESTING W REFLEX): HIV 1&2 Ab, 4th Generation: NONREACTIVE

## 2014-08-01 LAB — HEPATITIS C ANTIBODY: HCV AB: NEGATIVE

## 2014-08-02 NOTE — Assessment & Plan Note (Signed)
She has apparently been advised by [facility] that she needs lumbar surgery. Her back surgery has been postponed until her diabetes is better controlled.  . I will refill her pain medication today.     

## 2014-08-08 ENCOUNTER — Other Ambulatory Visit: Payer: Self-pay | Admitting: Internal Medicine

## 2014-08-23 ENCOUNTER — Ambulatory Visit (INDEPENDENT_AMBULATORY_CARE_PROVIDER_SITE_OTHER): Payer: BC Managed Care – PPO | Admitting: Internal Medicine

## 2014-08-23 ENCOUNTER — Encounter: Payer: Self-pay | Admitting: Internal Medicine

## 2014-08-23 VITALS — BP 172/96 | HR 84 | Temp 98.1°F | Resp 12 | Wt 181.8 lb

## 2014-08-23 DIAGNOSIS — L02219 Cutaneous abscess of trunk, unspecified: Secondary | ICD-10-CM

## 2014-08-23 DIAGNOSIS — L03319 Cellulitis of trunk, unspecified: Secondary | ICD-10-CM

## 2014-08-23 DIAGNOSIS — E1165 Type 2 diabetes mellitus with hyperglycemia: Principal | ICD-10-CM

## 2014-08-23 DIAGNOSIS — L02211 Cutaneous abscess of abdominal wall: Secondary | ICD-10-CM

## 2014-08-23 DIAGNOSIS — IMO0001 Reserved for inherently not codable concepts without codable children: Secondary | ICD-10-CM

## 2014-08-23 MED ORDER — INSULIN NPH ISOPHANE & REGULAR (70-30) 100 UNIT/ML ~~LOC~~ SUSP: SUBCUTANEOUS | Status: DC

## 2014-08-23 NOTE — Patient Instructions (Signed)
Continue insulin 70/30 35 units 30 min before breakfast and 65 units before dinner Continue Metformin 1000 mg 2x a day. Please return in 1.5 months with your sugar log.

## 2014-08-23 NOTE — Progress Notes (Signed)
Patient ID: [name], female   DOB: 10-Feb-1961, 53 y.o.   MRN: [id number]  HPI: [name] is a 53 y.o.-year-old female, initially referred by her PCP, Dr.Tullo, for management of DM2, dx ~2000, insulin-dependent since 2010, uncontrolled, with complications (PN, + DR), now returning for f/u for DM2. Last visit 1.5 mo ago.  Last hemoglobin A1c was: Lab Results  Component Value Date   HGBA1C 10.3* 07/06/2014   HGBA1C 7.0* 03/26/2014   HGBA1C 9.5* 12/26/2013  Had cortisone inj in wrists in 10/2013.  Pt is on a regimen of: - Metformin 1000 mg po bid  - Novolin 70/30  35 in am and 65 in pm - still not taking it as advised - can miss few days at a time! (price still an issue - although unfortunately this is the cheapest insulin) We stopped Glipizide 10 mg bid.  Pt checks her sugars 2-3x a day and they are very variable - reviewed log: - am: 63-274 >> 52-218, usu. 70-140 >> 58-249 (most low 100s) >> 200-300s >> 146-211 >> 85-209 (110-180) >> 86-209, but 302, 341, 542 >> 82-349 - 2h after b'fast: 120-195 >> 154 >> n/c - lunch: n/c >> 85, 148, 445 >> 269  - mid-afternoon: 63-146 >> 78-174 >> 51-209 (100-170) >> 135-225 >> 215 - before dinner: 76-200 >> not checking >> n/c >> 114-200 >> 59-183 >> 51x1, 91-160, 296 x1 >> 113-300s in last 2 days >> 49, 78, 139-325 - bedtime: 170-190s >> 301 (forgot insulin with that meal) >> cannot remember (79 >> 170s) >> 87-269  >> 82-167 >> 87-196 >> 92-266 Lowest sugar was 49 x1; she has hypoglycemia awareness at 80. Highest sugar was 542 >> 448  - no CKD, last BUN/creatinine:  Lab Results  Component Value Date   BUN 13 07/30/2014   CREATININE 0.6 07/30/2014  Last ACR 0.5 06/19/13. Not on ACEI/ARB.  - last set of lipids: Lab Results  Component Value Date   CHOL 204* 07/30/2014   HDL 39.80 07/30/2014   LDLCALC 146* 07/30/2014   LDLDIRECT 89.7 11/20/2011   TRIG 92.0 07/30/2014   CHOLHDL 5 07/30/2014  She is on Lipitor.   - last eye exam was in  02/2014. + DR L>R - Dr. Ricki Miller.  - + numbness and tingling in her feet.   She also has a history of hypertension, hyperlipidemia-on Lipitor, episodic alcohol abuse, GERD, low back pain, osteoarthritis.   ROS: Constitutional: + weight gain, no fatigue, no subjective hyperthermia/hypothermia, + nocturia, + poor sleep Eyes: + blurry vision, no xerophthalmia ENT: no sore throat, no nodules palpated in throat, no dysphagia/odynophagia, no hoarseness Cardiovascular: no CP/SOB/palpitations/leg swelling. She c/o leg cramps. Respiratory: no cough/SOB Gastrointestinal: no N/V/D/C Musculoskeletal: + muscle/+ joint aches Skin: no rashes Neurological: no tremors/numbness/tingling/dizziness  I reviewed pt's medications, allergies, PMH, social hx, family hx and no changes required, except as mentioned above.  PE: BP 172/96  Pulse 84  Temp(Src) 98.1 F (36.7 C) (Oral)  Resp 12  Wt 181 lb 12.8 oz (82.464 kg)  SpO2 99% Wt Readings from Last 3 Encounters:  08/23/14 181 lb 12.8 oz (82.464 kg)  07/31/14 179 lb (81.194 kg)  07/06/14 177 lb (80.287 kg)   Constitutional: overweight, in NAD Eyes: PERRLA, EOMI, no exophthalmos ENT: moist mucous membranes, no thyromegaly, no cervical lymphadenopathy Cardiovascular: RRR, No MRG Respiratory: CTA B Gastrointestinal: abdomen soft, NT, ND, BS+ Musculoskeletal: no deformities, strength intact in all 4 Skin: moist, warm - skin induration and warmth under  R breast and R abd. Fat pad Neurological: no tremor with outstretched hands, DTR normal in all 4  ASSESSMENT: 1. DM2, insulin-dependent, uncontrolled, with complications - peripheral neuropathy - DR   2. Skin abscesses  PLAN:  1. Patient with long-standing,uncontrolled diabetes, on a premixed insulin regimen (limited finances). She is still not completely compliant with her regimen 2/2 price mostly and she can also forget doses - I advised her to: Patient Instructions  Continue insulin 70/30 35  units 30 min before breakfast and 65 units before dinner Continue Metformin 1000 mg 2x a day. Please return in 1.5 months with your sugar log.  - continue checking her sugars at different times of the day - check 3 times a day, rotating checks  - she is up-to-date with eye exams - Return to clinic in 1.5 mo with sugar log   2. Skin abscesses - advised to use warm compresses ad Epsom salt baths and if not better in few days >> contact PCP

## 2014-09-04 ENCOUNTER — Other Ambulatory Visit: Payer: Self-pay | Admitting: Internal Medicine

## 2014-10-04 ENCOUNTER — Encounter: Payer: Self-pay | Admitting: Internal Medicine

## 2014-10-04 ENCOUNTER — Ambulatory Visit (INDEPENDENT_AMBULATORY_CARE_PROVIDER_SITE_OTHER): Payer: BC Managed Care – PPO | Admitting: Internal Medicine

## 2014-10-04 ENCOUNTER — Other Ambulatory Visit (INDEPENDENT_AMBULATORY_CARE_PROVIDER_SITE_OTHER): Payer: BC Managed Care – PPO | Admitting: *Deleted

## 2014-10-04 VITALS — BP 112/68 | HR 89 | Temp 98.1°F | Resp 12 | Wt 184.0 lb

## 2014-10-04 DIAGNOSIS — IMO0001 Reserved for inherently not codable concepts without codable children: Secondary | ICD-10-CM

## 2014-10-04 DIAGNOSIS — M791 Myalgia, unspecified site: Secondary | ICD-10-CM

## 2014-10-04 DIAGNOSIS — Z23 Encounter for immunization: Secondary | ICD-10-CM

## 2014-10-04 DIAGNOSIS — E1165 Type 2 diabetes mellitus with hyperglycemia: Secondary | ICD-10-CM

## 2014-10-04 LAB — TSH: TSH: 1.07 u[IU]/mL (ref 0.35–4.50)

## 2014-10-04 LAB — HEMOGLOBIN A1C: Hgb A1c MFr Bld: 8.8 % — ABNORMAL HIGH (ref 4.6–6.5)

## 2014-10-04 NOTE — Progress Notes (Signed)
Patient ID: [name], female   DOB: 1961/02/04, 53 y.o.   MRN: [id number]  HPI: [name] is a 53 y.o.-year-old female, returning for f/u for DM2, dx ~2000, insulin-dependent since 2010, uncontrolled, with complications (PN, + DR), now returning for f/u for DM2. Last visit 1.5 mo ago.  Last hemoglobin A1c was: Lab Results  Component Value Date   HGBA1C 10.3* 07/06/2014   HGBA1C 7.0* 03/26/2014   HGBA1C 9.5* 12/26/2013  Had cortisone inj in wrists in 10/2013.  Pt is on a regimen of: - Metformin 1000 mg po bid  - Novolin 70/30  35 in am and 65 in pm - still not taking it as advised - can miss doses/also takes the dinnertime insulin after she eats! (price still an issue - although unfortunately this is the cheapest insulin) We stopped Glipizide 10 mg bid.  Pt checks her sugars 2-3x a day and they are very variable - reviewed log: - am: 63-274 >> 52-218, usu. 70-140 >> 58-249 (most low 100s) >> 200-300s >> 146-211 >> 85-209 (110-180) >> 86-209, but 302, 341, 542 >> 82-349 >> 46 x1, 137-162, 190x1, 302x1 in last week, higher before  - 2h after b'fast: 120-195 >> 154 >> n/c  - lunch: n/c >> 85, 148, 445 >> 269  >> n/c - mid-afternoon: 63-146 >> 78-174 >> 51-209 (100-170) >> 135-225 >> 215 >> 106-196, 290, with higher sugars before last week if she forgets insulin - before dinner: 76-200 >> not checking >> n/c >> 114-200 >> 59-183 >> 51x1, 91-160, 296 x1 >> 113-300s in last 2 days >> 49, 78, 139-325 >> see above - bedtime: 170-190s >> 301 (forgot insulin with that meal) >> cannot remember (79 >> 170s) >> 87-269  >> 82-167 >> 87-196 >> 92-266 >> 84-214, 350 Lowest sugar was 46 x1; she has hypoglycemia awareness at 80. Highest sugar was 542 >> 448 >> 350  - no CKD, last BUN/creatinine:  Lab Results  Component Value Date   BUN 13 07/30/2014   CREATININE 0.6 07/30/2014  Last ACR 0.5 06/19/13. Not on ACEI/ARB.  - last set of lipids: Lab Results  Component Value Date   CHOL 204*  07/30/2014   HDL 39.80 07/30/2014   LDLCALC 146* 07/30/2014   LDLDIRECT 89.7 11/20/2011   TRIG 92.0 07/30/2014   CHOLHDL 5 07/30/2014  She is on Lipitor.   - last eye exam was in 02/2014. + DR L>R - Dr. Ricki Miller.  - + numbness and tingling in her feet.   She also has a history of hypertension, hyperlipidemia-on Lipitor, episodic alcohol abuse, GERD, low back pain, osteoarthritis.   ROS: Constitutional: no weight gain, no fatigue, no subjective hyperthermia/hypothermia Eyes: + occasional blurry vision, no xerophthalmia ENT: no sore throat, no nodules palpated in throat, no dysphagia/odynophagia, no hoarseness Cardiovascular: no CP/SOB/palpitations/leg swelling. She c/o leg cramps. Respiratory: no cough/SOB Gastrointestinal: no N/V/D/C Musculoskeletal: + muscle/no joint aches Skin: no rashes Neurological: no tremors/numbness/tingling/dizziness  I reviewed pt's medications, allergies, PMH, social hx, family hx and no changes required, except as mentioned above.  PE: BP 112/68 mmHg  Pulse 89  Temp(Src) 98.1 F (36.7 C) (Oral)  Resp 12  Wt 184 lb (83.462 kg)  SpO2 99% Wt Readings from Last 3 Encounters:  10/04/14 184 lb (83.462 kg)  08/23/14 181 lb 12.8 oz (82.464 kg)  07/31/14 179 lb (81.194 kg)   Constitutional: overweight, in NAD Eyes: PERRLA, EOMI, no exophthalmos ENT: moist mucous membranes, no thyromegaly, no cervical lymphadenopathy Cardiovascular:  RRR, No MRG Respiratory: CTA B Gastrointestinal: abdomen soft, NT, ND, BS+ Musculoskeletal: no deformities, strength intact in all 4 Skin: moist, warm Neurological: no tremor with outstretched hands, DTR normal in all 4  ASSESSMENT: 1. DM2, insulin-dependent, uncontrolled, with complications - peripheral neuropathy - DR   2. Muscle aches  PLAN:  1. Patient with long-standing,uncontrolled diabetes, on a premixed insulin regimen (limited finances). She is still not completely compliant with her regimen 2/2 price  mostly and she can also forget doses but I advised her to move the dinnertime insulin before dinner as she had some lows at night. If she is compliant with the regimen, sugars are much better, per her log. - I advised her to: Patient Instructions  Continue insulin 70/30 35 units 30 min before breakfast and 65 units before dinner Continue Metformin 1000 mg 2x a day. Please return in 1.5 months with your sugar log.  - continue checking her sugars at different times of the day - check 3 times a day, rotating checks  - she is up-to-date with eye exams - will give flu vaccine - check HbA1c - Return to clinic in 1.5 mo with sugar log   2. MM aches - back of thighs - will check TSH today since she also c/o facial swelling - no recent thyroid tests per chart review  Office Visit on 10/04/2014  Component Date Value Ref Range Status  . TSH 10/04/2014 1.07  0.35 - 4.50 uIU/mL Final  . Hgb A1c MFr Bld 10/04/2014 8.8* 4.6 - 6.5 % Final   Glycemic Control Guidelines for People with Diabetes:Non Diabetic:  <6%Goal of Therapy: <7%Additional Action Suggested:  >8%    Hemoglobin A1c improved.  TSH normal.

## 2014-10-04 NOTE — Patient Instructions (Signed)
Patient Instructions  Continue insulin 70/30 35 units 30 min before breakfast and 65 units before dinner Continue Metformin 1000 mg 2x a day. Please return in 3 months with your sugar log.  Please stop at the lab.

## 2014-10-29 ENCOUNTER — Other Ambulatory Visit: Payer: Self-pay | Admitting: *Deleted

## 2014-10-29 ENCOUNTER — Other Ambulatory Visit: Payer: Self-pay | Admitting: Internal Medicine

## 2014-12-06 ENCOUNTER — Other Ambulatory Visit: Payer: Self-pay | Admitting: Internal Medicine

## 2014-12-06 ENCOUNTER — Telehealth: Payer: Self-pay | Admitting: Internal Medicine

## 2014-12-06 NOTE — Telephone Encounter (Signed)
Patient wanting to be seen for left Knee pain stated knee is swollen and pain is rated at 5/ out 0-10 scale. Scheduled patient acute on 12/10/14. FYI

## 2014-12-10 ENCOUNTER — Encounter: Payer: Self-pay | Admitting: Internal Medicine

## 2014-12-10 ENCOUNTER — Ambulatory Visit: Payer: BC Managed Care – PPO | Admitting: Internal Medicine

## 2014-12-10 ENCOUNTER — Ambulatory Visit (INDEPENDENT_AMBULATORY_CARE_PROVIDER_SITE_OTHER): Payer: BC Managed Care – PPO | Admitting: Internal Medicine

## 2014-12-10 VITALS — BP 132/86 | HR 82 | Temp 98.2°F | Wt 189.0 lb

## 2014-12-10 DIAGNOSIS — M1712 Unilateral primary osteoarthritis, left knee: Secondary | ICD-10-CM

## 2014-12-10 DIAGNOSIS — M25562 Pain in left knee: Secondary | ICD-10-CM

## 2014-12-10 DIAGNOSIS — M129 Arthropathy, unspecified: Secondary | ICD-10-CM

## 2014-12-10 NOTE — Progress Notes (Signed)
Pre visit review using our clinic review tool, if applicable. No additional management support is needed unless otherwise documented below in the visit note. 

## 2014-12-10 NOTE — Progress Notes (Signed)
Subjective:    Patient ID: [name], female    DOB: 07/27/1961, 54 y.o.   MRN: [id number]  HPI   [name] is a 54 year old female who presents today with chief complaint of left knee pain.  The pain started one week ago and she describes it as dull, achy pain.  She reports that her knee was swollen two days ago but has since gone down.  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  She has taken her hydrocodone with some relief. She denies any injury to the area. She denies numbness or tingling in the leg.    Review of Systems  Constitutional: Negative for fever, activity change and unexpected weight change.  Respiratory: Negative for cough and shortness of breath.   Cardiovascular: Negative for chest pain, palpitations and leg swelling.  Musculoskeletal: Positive for joint swelling. Negative for back pain and gait problem.  Skin: Negative for color change, pallor, rash and wound.   Past Medical History  Diagnosis Date  . Hyperlipidemia   . Diabetes mellitus   . Gastritis with bleeding due to alcohol 2010    [facility] admission  . Campylobacter enteritis May 2012    [facility] admission  . Arthritis   . Carpal tunnel syndrome, bilateral   . Hypertension     no meds  . Wears glasses   . GERD (gastroesophageal reflux disease)    Family History  Problem Relation Age of Onset  . Hypertension Mother   . Stroke Father   . Diabetes Maternal Grandmother   . Hypertension Maternal Grandmother    Current Outpatient Prescriptions on File Prior to Visit  Medication Sig Dispense Refill  . atorvastatin (LIPITOR) 40 MG tablet Take 1 tablet (40 mg total) by mouth daily. 30 tablet 5  . BAYER CONTOUR TEST test strip TEST BLOOD SUGARS 3 TIMES DAILY 100 each 0  . etodolac (LODINE) 400 MG tablet Take 400 mg by mouth daily.     Marland Kitchen gabapentin (NEURONTIN) 300 MG capsule     . HYDROcodone-acetaminophen (NORCO) 10-325 MG  per tablet Take 1 tablet by mouth at bedtime as needed and may repeat dose one time if needed for severe pain. 60 tablet 0  . hydrocortisone (ANUSOL-HC) 25 MG suppository Place 1 suppository (25 mg total) rectally 2 (two) times daily. 12 suppository 0  . insulin NPH-regular Human (NOVOLIN 70/30) (70-30) 100 UNIT/ML injection Inject 35 units 30 min before breakfast and 65 units 30 min before dinner 30 mL 11  . lansoprazole (PREVACID) 30 MG capsule Take 1 capsule (30 mg total) by mouth daily at 12 noon. 30 capsule 2  . medroxyPROGESTERone (DEPO-PROVERA) 150 MG/ML injection Inject 150 mg into the muscle every 3 (three) months.      . metFORMIN (GLUCOPHAGE) 1000 MG tablet TAKE ONE TABLET BY MOUTH TWICE DAILY WITH MEALS 60 tablet 0  . omeprazole (PRILOSEC) 40 MG capsule TAKE ONE CAPSULE BY MOUTH ONCE DAILY *NEEDS  OFFICE  VISIT  PRIOR  TO  FURTHER  REFILLS* 15 capsule 0  . valACYclovir (VALTREX) 500 MG tablet     . [DISCONTINUED] esomeprazole (NEXIUM) 40 MG capsule Take 40 mg by mouth daily.     No current facility-administered medications on file prior to visit.       Objective:   Physical Exam  Constitutional: She appears well-developed and well-nourished.  HENT:  Head: Normocephalic  and atraumatic.  Cardiovascular: Normal rate, regular rhythm and normal heart sounds.   Pulmonary/Chest: Effort normal and breath sounds normal.  Musculoskeletal:       Right knee: She exhibits no swelling, no effusion, no ecchymosis and no erythema.       Left knee: She exhibits normal range of motion, no swelling and no effusion. Tenderness found. Medial joint line and lateral joint line tenderness noted.  BP 132/86 mmHg  Pulse 82  Temp(Src) 98.2 F (36.8 C) (Oral)  Wt 189 lb (85.73 kg)  SpO2 98%         Assessment & Plan:  Left knee pain secondary to arthritis:    Advised patient to take naproxen 220mg  (1 tab) bid for pain.  Patient is diabetic with normal creatinine and GFR. Will hold off on xray  for now as it would not change treatment plan at this time.   If pain does not improve, she was advised to call office to make a follow up appointment.

## 2014-12-10 NOTE — Patient Instructions (Signed)

## 2014-12-11 ENCOUNTER — Telehealth: Payer: Self-pay | Admitting: Internal Medicine

## 2014-12-11 NOTE — Telephone Encounter (Signed)
Patient has enough medication to last until Md can consulted, stated she saw NP for knee pain and was advised arthritis and would need to see her MD for pain medication.

## 2014-12-11 NOTE — Telephone Encounter (Signed)
Patient needs prescription for Hydrocodone refilled.

## 2014-12-11 NOTE — Telephone Encounter (Signed)
Ok to refill Hydrocodone? Last visit 12/10/14 @ [facility] NP last visit MD 9/15 date of last refill>

## 2014-12-11 NOTE — Telephone Encounter (Signed)
Need more info - as we discussed 

## 2014-12-12 MED ORDER — HYDROCODONE-ACETAMINOPHEN 10-325 MG PO TABS: 1.0000 | ORAL_TABLET | Freq: Every evening | ORAL | Status: DC | PRN

## 2014-12-12 NOTE — Telephone Encounter (Signed)
Patient notified and script placed at front desk. 

## 2014-12-12 NOTE — Telephone Encounter (Signed)
HC refilled.

## 2014-12-19 ENCOUNTER — Other Ambulatory Visit: Payer: Self-pay | Admitting: Internal Medicine

## 2015-01-04 ENCOUNTER — Ambulatory Visit (INDEPENDENT_AMBULATORY_CARE_PROVIDER_SITE_OTHER): Payer: BC Managed Care – PPO | Admitting: Internal Medicine

## 2015-01-04 ENCOUNTER — Encounter: Payer: Self-pay | Admitting: Internal Medicine

## 2015-01-04 VITALS — BP 124/76 | HR 84 | Temp 98.2°F | Resp 12 | Wt 187.0 lb

## 2015-01-04 DIAGNOSIS — E1165 Type 2 diabetes mellitus with hyperglycemia: Secondary | ICD-10-CM

## 2015-01-04 DIAGNOSIS — IMO0001 Reserved for inherently not codable concepts without codable children: Secondary | ICD-10-CM

## 2015-01-04 LAB — HEMOGLOBIN A1C: Hgb A1c MFr Bld: 9.4 % — ABNORMAL HIGH (ref 4.6–6.5)

## 2015-01-04 MED ORDER — INSULIN REGULAR HUMAN 100 UNIT/ML IJ SOLN: INTRAMUSCULAR | Status: DC

## 2015-01-04 MED ORDER — INSULIN NPH (HUMAN) (ISOPHANE) 100 UNIT/ML ~~LOC~~ SUSP: SUBCUTANEOUS | Status: DC

## 2015-01-04 NOTE — Patient Instructions (Addendum)
Please stop the 70/30 insulin and start the following:  Insulin Before breakfast Before lunch Before dinner  Regular 15  25  NPH 25  45   Please inject the insulin 30 min before meals.  Please return in 1 month with your sugar log.   Please stop at the lab.

## 2015-01-04 NOTE — Progress Notes (Signed)
Patient ID: [name], female   DOB: 12-10-1960, 54 y.o.   MRN: [id number]  HPI: [name] is a 54 y.o.-year-old female, returning for f/u for DM2, dx ~2000, insulin-dependent since 2010, uncontrolled, with complications (PN, + DR), now returning for f/u for DM2. Last visit 1.5 mo ago.  Last hemoglobin A1c was: Lab Results  Component Value Date   HGBA1C 8.8* 10/04/2014   HGBA1C 10.3* 07/06/2014   HGBA1C 7.0* 03/26/2014  Had cortisone inj in wrists in 10/2013.  Pt is on a regimen of: - Metformin 1000 mg po bid  - Novolin 70/30  35 in am and 65 in pm - can miss doses, but less than before We stopped Glipizide 10 mg bid.  Pt checks her sugars 2-3x a day and they are very variable - reviewed log: - am:  82-349 >> 46 x1, 137-162, 190x1, 302x1 in last week, higher before >> 93-295 - 2h after b'fast: 120-195 >> 154 >> n/c  - lunch: n/c >> 85, 148, 445 >> 269  >> n/c - mid-afternoon: 106-196, 290, with higher sugars before last week if she forgets insulin >> 118, 208 - before dinner: 113-300s in last 2 days >> 49, 78, 139-325 >> see above >> 161-243   - 2h after dinner: 72, 114-292 - bedtime: 87-269  >> 82-167 >> 87-196 >> 92-266 >> 84-214, 350 >> 75-288 Lowest sugar was 46 x1; she has hypoglycemia awareness at 80. Highest sugar was 542 >> 448 >> 350  - no CKD, last BUN/creatinine:  Lab Results  Component Value Date   BUN 13 07/30/2014   CREATININE 0.6 07/30/2014  Last ACR 0.5 06/19/13. Not on ACEI/ARB.  - last set of lipids: Lab Results  Component Value Date   CHOL 204* 07/30/2014   HDL 39.80 07/30/2014   LDLCALC 146* 07/30/2014   LDLDIRECT 89.7 11/20/2011   TRIG 92.0 07/30/2014   CHOLHDL 5 07/30/2014  She is on Lipitor.   - last eye exam was in 02/2014. + DR L>R - Dr. Ricki Miller.  - + numbness and tingling in her feet.   She also has a history of hypertension, hyperlipidemia-on Lipitor, episodic alcohol abuse, GERD, low back pain, osteoarthritis.    ROS: Constitutional: + weight gain, no fatigue, no subjective hyperthermia/hypothermia Eyes: nol blurry vision, no xerophthalmia ENT: no sore throat, no nodules palpated in throat, no dysphagia/odynophagia, no hoarseness Cardiovascular: no CP/SOB/palpitations/leg swelling. She c/o leg cramps. Respiratory: no cough/SOB Gastrointestinal: no N/V/D/C Musculoskeletal: no muscle/no joint aches Skin: no rashes Neurological: no tremors/numbness/tingling/dizziness  I reviewed pt's medications, allergies, PMH, social hx, family hx, and changes were documented in the history of present illness. Otherwise, unchanged from my initial visit note.  PE: BP 124/76 mmHg  Pulse 84  Temp(Src) 98.2 F (36.8 C) (Oral)  Resp 12  Wt 187 lb (84.823 kg)  SpO2 98% Wt Readings from Last 3 Encounters:  01/04/15 187 lb (84.823 kg)  12/10/14 189 lb (85.73 kg)  10/04/14 184 lb (83.462 kg)   Constitutional: overweight, in NAD Eyes: PERRLA, EOMI, no exophthalmos ENT: moist mucous membranes, no thyromegaly, no cervical lymphadenopathy Cardiovascular: RRR, No MRG Respiratory: CTA B Gastrointestinal: abdomen soft, NT, ND, BS+ Musculoskeletal: no deformities, strength intact in all 4 Skin: moist, warm Neurological: no tremor with outstretched hands, DTR normal in all 4  ASSESSMENT: 1. DM2, insulin-dependent, uncontrolled, with complications - peripheral neuropathy - DR   PLAN:  1. Patient with long-standing,uncontrolled diabetes, on a premixed insulin regimen (limited finances). She is  still not completely compliant with her regimen but doing a better job lately. However, due to highs alternating with occasional lows >> we reached the limit of what we can do with her 70/30 regimen >> I suggested to start basal-bolus regimen and will increase the doses. - I advised her to: Patient Instructions   Please stop the 70/30 insulin and start the following:  Insulin Before breakfast Before lunch Before dinner   Regular 15  25  NPH 25  45   Please inject the insulin 30 min before meals.  Please return in 1 month with your sugar log.   Please stop at the lab.  - continue checking her sugars at different times of the day - check 3 times a day, rotating checks  - she is up-to-date with eye exams - she had flu vaccine this season - check HbA1c today - Return to clinic in 1 mo with sugar log   Office Visit on 01/04/2015  Component Date Value Ref Range Status  . Hgb A1c MFr Bld 01/04/2015 9.4* 4.6 - 6.5 % Final   Glycemic Control Guidelines for People with Diabetes:Non Diabetic:  <6%Goal of Therapy: <7%Additional Action Suggested:  >8%   HbA1c higher >> see plan above.

## 2015-01-15 NOTE — Telephone Encounter (Signed)
error 

## 2015-02-07 ENCOUNTER — Encounter: Payer: Self-pay | Admitting: Internal Medicine

## 2015-02-07 ENCOUNTER — Ambulatory Visit (INDEPENDENT_AMBULATORY_CARE_PROVIDER_SITE_OTHER): Payer: BC Managed Care – PPO | Admitting: Internal Medicine

## 2015-02-07 VITALS — BP 122/70 | HR 88 | Temp 98.0°F | Ht 64.0 in | Wt 187.0 lb

## 2015-02-07 DIAGNOSIS — IMO0001 Reserved for inherently not codable concepts without codable children: Secondary | ICD-10-CM

## 2015-02-07 DIAGNOSIS — E1165 Type 2 diabetes mellitus with hyperglycemia: Secondary | ICD-10-CM

## 2015-02-07 MED ORDER — INSULIN NPH (HUMAN) (ISOPHANE) 100 UNIT/ML ~~LOC~~ SUSP: SUBCUTANEOUS | Status: DC

## 2015-02-07 MED ORDER — INSULIN REGULAR HUMAN 100 UNIT/ML IJ SOLN: INTRAMUSCULAR | Status: DC

## 2015-02-07 NOTE — Patient Instructions (Signed)
Please continue:  - Metformin 1000 mg 2x a day with meals  - Insulin N and R - inject 30 min before a meal:   Insulin Before breakfast Before lunch Before dinner  Regular 15 10 (if you eat lunch) 25  NPH 25  45   Please return in 1.5 month with your sugar log.

## 2015-02-07 NOTE — Progress Notes (Signed)
Patient ID: [name], female   DOB: 07/11/1961, 54 y.o.   MRN: [id number]  HPI: [name] is a 54 y.o.-year-old female, returning for f/u for DM2, dx ~2000, insulin-dependent since 2010, uncontrolled, with complications (PN, + DR). Last visit 1 mo ago.  Last hemoglobin A1c was: Lab Results  Component Value Date   HGBA1C 9.4* 01/04/2015   HGBA1C 8.8* 10/04/2014   HGBA1C 10.3* 07/06/2014  Had cortisone inj in wrists in 10/2013.  Pt was on a regimen of: - Metformin 1000 mg po bid  - Novolin 70/30  35 in am and 65 in pm - can miss doses, but less than before We stopped Glipizide 10 mg bid.  At last visit, we had to switch to: - Metformin 1000 mg po bid  Insulin Before breakfast Before lunch Before dinner  Regular 15  25  NPH 25  45   Pt checks her sugars 2-3x a day and they are very variable - reviewed log: - am:  46 x1, 137-162, 190x1, 302x1 in last week, higher before >> 93-295 >> 76-183, 249 - 2h after b'fast: 120-195 >> 154 >> n/c > 203 - lunch: n/c >> 85, 148, 445 >> 269  >> n/c - mid-afternoon: 106-196, 290 >> 118, 208 >> 180 - before dinner: 113-300s in last 2 days >> 49, 78, 139-325 >> see above >> 161-243 >> 75-126, 189, 235 - 2h after dinner: 72, 114-292 >> n/c - bedtime: 87-269  >> 82-167 >> 87-196 >> 92-266 >> 84-214, 350 >> 75-288 >> 66, 90-241, 338 Lowest sugar was 46 x1 >> 54 x1; she has hypoglycemia awareness at 80. Highest sugar was 542 >> 448 >> 350 >> 338  - no CKD, last BUN/creatinine:  Lab Results  Component Value Date   BUN 13 07/30/2014   CREATININE 0.6 07/30/2014  Last ACR 0.5 06/19/13. Not on ACEI/ARB.  - last set of lipids: Lab Results  Component Value Date   CHOL 204* 07/30/2014   HDL 39.80 07/30/2014   LDLCALC 146* 07/30/2014   LDLDIRECT 89.7 11/20/2011   TRIG 92.0 07/30/2014   CHOLHDL 5 07/30/2014  She is on Lipitor.   - last eye exam was in 02/2014. + DR L>R - Dr. Ricki Miller.  - + numbness and tingling in her feet.   She also  has a history of hypertension, hyperlipidemia-on Lipitor, episodic alcohol abuse, GERD, low back pain, osteoarthritis.   ROS: Constitutional: no weight gain, no fatigue, no subjective hyperthermia/hypothermia Eyes: nol blurry vision, no xerophthalmia ENT: no sore throat, no nodules palpated in throat, no dysphagia/odynophagia, no hoarseness Cardiovascular: no CP/SOB/palpitations/leg swelling Respiratory: no cough/SOB Gastrointestinal: no N/V/D/C Musculoskeletal: no muscle/+joint aches: back, knees Skin: no rashes Neurological: no tremors/numbness/tingling/dizziness  I reviewed pt's medications, allergies, PMH, social hx, family hx, and changes were documented in the history of present illness. Otherwise, unchanged from my initial visit note.  PE: BP 122/70 mmHg  Pulse 88  Temp(Src) 98 F (36.7 C) (Oral)  Ht 5\' 4"  (1.626 m)  Wt 187 lb (84.823 kg)  BMI 32.08 kg/m2  SpO2 98% Wt Readings from Last 3 Encounters:  02/07/15 187 lb (84.823 kg)  01/04/15 187 lb (84.823 kg)  12/10/14 189 lb (85.73 kg)   Constitutional: overweight, in NAD Eyes: PERRLA, EOMI, no exophthalmos ENT: moist mucous membranes, no thyromegaly, no cervical lymphadenopathy Cardiovascular: RRR, No MRG Respiratory: CTA B Gastrointestinal: abdomen soft, NT, ND, BS+ Musculoskeletal: no deformities, strength intact in all 4 Skin: moist, warm Neurological: no tremor  with outstretched hands, DTR normal in all 4  ASSESSMENT: 1. DM2, insulin-dependent, uncontrolled, with complications - peripheral neuropathy - DR   PLAN:  1. Patient with long-standing,uncontrolled diabetes, now on basal-bolus insulin regimen with N and R insulin (limited finances). She is doing better, but still has spikes especially if she misses insulin. She takes the insulin at night before or after she eats dinner >> advised to move this 30 min before dinner. Will also add a lunchtime R bolus if she eats lunch (10 units). She had 1 low at night. If  this continues, will need to move the NPH to bedtime. - I advised her to: Patient Instructions   Please continue:  - Metformin 1000 mg 2x a day with meals  - Insulin N and R - inject 30 min before a meal:   Insulin Before breakfast Before lunch Before dinner  Regular 15 10 (if you eat lunch) 25  NPH 25  45   Please return in 1.5 month with your sugar log.   - continue checking her sugars at different times of the day - check 3 times a day, rotating checks  - she is up-to-date with eye exams >> needs a new one - she had flu vaccine this season - Return to clinic in 1.5 mo with sugar log

## 2015-02-07 NOTE — Progress Notes (Signed)
Pre visit review using our clinic review tool, if applicable. No additional management support is needed unless otherwise documented below in the visit note. 

## 2015-03-04 ENCOUNTER — Encounter: Payer: Self-pay | Admitting: Internal Medicine

## 2015-03-04 ENCOUNTER — Ambulatory Visit (INDEPENDENT_AMBULATORY_CARE_PROVIDER_SITE_OTHER): Payer: BC Managed Care – PPO | Admitting: Internal Medicine

## 2015-03-04 VITALS — BP 144/82 | HR 86 | Temp 98.2°F | Resp 14 | Ht 64.0 in | Wt 186.0 lb

## 2015-03-04 DIAGNOSIS — Z23 Encounter for immunization: Secondary | ICD-10-CM

## 2015-03-04 DIAGNOSIS — E1169 Type 2 diabetes mellitus with other specified complication: Secondary | ICD-10-CM

## 2015-03-04 DIAGNOSIS — I10 Essential (primary) hypertension: Secondary | ICD-10-CM | POA: Diagnosis not present

## 2015-03-04 DIAGNOSIS — IMO0001 Reserved for inherently not codable concepts without codable children: Secondary | ICD-10-CM

## 2015-03-04 DIAGNOSIS — E1165 Type 2 diabetes mellitus with hyperglycemia: Secondary | ICD-10-CM | POA: Diagnosis not present

## 2015-03-04 DIAGNOSIS — E785 Hyperlipidemia, unspecified: Secondary | ICD-10-CM

## 2015-03-04 MED ORDER — EASY TOUCH LANCING DEVICE MISC: Status: DC

## 2015-03-04 MED ORDER — HYDROCODONE-ACETAMINOPHEN 10-325 MG PO TABS: 1.0000 | ORAL_TABLET | Freq: Every evening | ORAL | Status: DC | PRN

## 2015-03-04 NOTE — Patient Instructions (Signed)
I will review the records form your previous orthopedist when they are available.   Osteoarthritis Osteoarthritis is a disease that causes soreness and inflammation of a joint. It occurs when the cartilage at the affected joint wears down. Cartilage acts as a cushion, covering the ends of bones where they meet to form a joint. Osteoarthritis is the most common form of arthritis. It often occurs in older people. The joints affected most often by this condition include those in the:  Ends of the fingers.  Thumbs.  Neck.  Lower back.  Knees.  Hips. CAUSES  Over time, the cartilage that covers the ends of bones begins to wear away. This causes bone to rub on bone, producing pain and stiffness in the affected joints.  RISK FACTORS Certain factors can increase your chances of having osteoarthritis, including:  Older age.  Excessive body weight.  Overuse of joints.  Previous joint injury. SIGNS AND SYMPTOMS   Pain, swelling, and stiffness in the joint.  Over time, the joint may lose its normal shape.  Small deposits of bone (osteophytes) may grow on the edges of the joint.  Bits of bone or cartilage can break off and float inside the joint space. This may cause more pain and damage. DIAGNOSIS  Your health care provider will do a physical exam and ask about your symptoms. Various tests may be ordered, such as:  X-rays of the affected joint.  An MRI scan.  Blood tests to rule out other types of arthritis.  Joint fluid tests. This involves using a needle to draw fluid from the joint and examining the fluid under a microscope. TREATMENT  Goals of treatment are to control pain and improve joint function. Treatment plans may include:  A prescribed exercise program that allows for rest and joint relief.  A weight control plan.  Pain relief techniques, such as:  Properly applied heat and cold.  Electric pulses delivered to nerve endings under the skin (transcutaneous  electrical nerve stimulation [TENS]).  Massage.  Certain nutritional supplements.  Medicines to control pain, such as:  Acetaminophen.  Nonsteroidal anti-inflammatory drugs (NSAIDs), such as naproxen.  Narcotic or central-acting agents, such as tramadol.  Corticosteroids. These can be given orally or as an injection.  Surgery to reposition the bones and relieve pain (osteotomy) or to remove loose pieces of bone and cartilage. Joint replacement may be needed in advanced states of osteoarthritis. HOME CARE INSTRUCTIONS   Take medicines only as directed by your health care provider.  Maintain a healthy weight. Follow your health care provider's instructions for weight control. This may include dietary instructions.  Exercise as directed. Your health care provider can recommend specific types of exercise. These may include:  Strengthening exercises. These are done to strengthen the muscles that support joints affected by arthritis. They can be performed with weights or with exercise bands to add resistance.  Aerobic activities. These are exercises, such as brisk walking or low-impact aerobics, that get your heart pumping.  Range-of-motion activities. These keep your joints limber.  Balance and agility exercises. These help you maintain daily living skills.  Rest your affected joints as directed by your health care provider.  Keep all follow-up visits as directed by your health care provider. SEEK MEDICAL CARE IF:   Your skin turns red.  You develop a rash in addition to your joint pain.  You have worsening joint pain.  You have a fever along with joint or muscle aches. SEEK IMMEDIATE MEDICAL CARE IF:  You  have a significant loss of weight or appetite.  You have night sweats. [place] of Arthritis and Musculoskeletal and Skin Diseases: www.niams.SouthExposed.es  [employer] on Aging: http://kim-miller.com/  American College of Rheumatology:  www.rheumatology.org Document Released: 11/16/2005 Document Revised: 04/02/2014 Document Reviewed: 07/24/2013 [facility] Patient Information 2015 [place], [place]. This information is not intended to replace advice given to you by your health care provider. Make sure you discuss any questions you have with your health care provider.

## 2015-03-04 NOTE — Progress Notes (Signed)
Pre-visit discussion using our clinic review tool. No additional management support is needed unless otherwise documented below in the visit note.  

## 2015-03-04 NOTE — Progress Notes (Signed)
Patient ID: [name], female   DOB: [birth year], 54 y.o.   MRN: [id number]  Patient Active Problem List   Diagnosis Date Noted  . Genital herpes 07/31/2014  . Furuncle of abdominal wall 09/21/2013  . Bilateral hand numbness 09/21/2013  . Noncompliance with diet and medication regimen 11/15/2012  . Tenosynovitis of thumb 01/02/2012  . Arthritis   . Screening for breast cancer 08/04/2011  . Screening for colon cancer 08/04/2011  . HIDRADENITIS SUPPURATIVA 04/11/2007  . GERD 11/17/2006  . ENDOMETRIAL POLYP 11/17/2006  . LOW BACK PAIN 11/17/2006  . FIBROIDS, UTERUS 11/16/2006  . Diabetes mellitus type 2, uncontrolled, without complications 12/45/8099  . Hyperlipidemia associated with type 2 diabetes mellitus 11/16/2006  . ALCOHOL ABUSE, EPISODIC 11/16/2006  . Essential hypertension 11/16/2006    Subjective:  CC:   Chief Complaint  Patient presents with  . Follow-up    To discuss issues    HPI:   [name] is a 54 y.o. female who presents for follow up on chronic pain complaints.  DM is now managed by Endocrinology.  She reports Bilateral knee pain left greater than right, and low back pain.  States that she has had an orthopedic evaluation but cannot state by whom,  No records available. Was told that she had OA .  No note in chart .  Someone in [place]  (Dr Albertine Patricia? Who did her ulnar nerve release in June. She thinks )   Not sleeping well . Right ankle hurts.  Has been out of vicodin for two weeks,  Taking tylenol      Past Medical History  Diagnosis Date  . Hyperlipidemia   . Diabetes mellitus   . Gastritis with bleeding due to alcohol 2010    [facility] admission  . Campylobacter enteritis May 2012    [facility] admission  . Arthritis   . Carpal tunnel syndrome, bilateral   . Hypertension     no meds  . Wears glasses   . GERD (gastroesophageal reflux disease)     Past Surgical History  Procedure Laterality Date  . Endometrial biopsy  2004    benign  .  Tonsillectomy    . Foot osteotomy  2010    right-spurs  . Cyst excision  1995    under arms  . Carpal tunnel release Left 05/14/2014    Procedure: LEFT ULNAR NEUROPLASTY AT ELBOW AND ENDOSCOPIC CARPAL TUNNEL RELEASE;  Surgeon: Jolyn Nap, MD;  Location: [place];  Service: Orthopedics;  Laterality: Left;  . Ulnar nerve transposition Left 05/14/2014    Procedure: ULNAR NERVE DECOMPRESSION/TRANSPOSITION;  Surgeon: Jolyn Nap, MD;  Location: [place];  Service: Orthopedics;  Laterality: Left;       The following portions of the patient's history were reviewed and updated as appropriate: Allergies, current medications, and problem list.    Review of Systems:   Patient denies headache, fevers, malaise, unintentional weight loss, skin rash, eye pain, sinus congestion and sinus pain, sore throat, dysphagia,  hemoptysis , cough, dyspnea, wheezing, chest pain, palpitations, orthopnea, edema, abdominal pain, nausea, melena, diarrhea, constipation, flank pain, dysuria, hematuria, urinary  Frequency, nocturia, numbness, tingling, seizures,  Focal weakness, Loss of consciousness,  Tremor, insomnia, depression, anxiety, and suicidal ideation.     History   Social History  . Marital Status: Single    Spouse Name: N/A  . Number of Children: N/A  . Years of Education: N/A   Occupational History  . Not on  file.   Social History Main Topics  . Smoking status: Former Smoker    Quit date: 05/09/2012  . Smokeless tobacco: Never Used  . Alcohol Use: No     Comment: not for 5 yr  . Drug Use: No  . Sexual Activity: Not on file   Other Topics Concern  . 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   She lives at home with daughter and grandson.    Objective:  Filed Vitals:   03/04/15 1518  BP: 144/82  Pulse: 86  Temp: 98.2 F (36.8 C)  Resp: 14     General appearance: alert, cooperative and appears  stated age Ears: normal TM's and external ear canals both ears Throat: lips, mucosa, and tongue normal; teeth and gums normal Neck: no adenopathy, no carotid bruit, supple, symmetrical, trachea midline and thyroid not enlarged, symmetric, no tenderness/mass/nodules Back: symmetric, no curvature. ROM normal. No CVA tenderness. Lungs: clear to auscultation bilaterally Heart: regular rate and rhythm, S1, S2 normal, no murmur, click, rub or gallop Abdomen: soft, non-tender; bowel sounds normal; no masses,  no organomegaly Pulses: 2+ and symmetric Skin: Skin color, texture, turgor normal. No rashes or lesions Lymph nodes: Cervical, supraclavicular, and axillary nodes normal.  Assessment and Plan:  Essential hypertension Well controlled on current regimen. Renal function is due , o changens today.but she did not go to the lab. Lab Results  Component Value Date   CREATININE 0.6 07/30/2014   Lab Results  Component Value Date   NA 137 07/30/2014   K 4.1 07/30/2014   CL 107 07/30/2014   CO2 23 07/30/2014      Hyperlipidemia associated with type 2 diabetes mellitus She is tolerating a high potensity statin without side effects.  She is due for repeat labs.     LOW BACK PAIN She has apparently been advised by [facility] orthopedics that she needs lumbar surgery. Her back surgery has been postponed until her diabetes is better controlled.  . I will refill her pain medication today.        Updated Medication List Outpatient Encounter Prescriptions as of 03/04/2015  Medication Sig  . atorvastatin (LIPITOR) 40 MG tablet Take 1 tablet (40 mg total) by mouth daily.  Marland Kitchen BAYER CONTOUR TEST test strip TEST BLOOD SUGARS 3 TIMES DAILY  . insulin NPH Human (NOVOLIN N RELION) 100 UNIT/ML injection Inject up to 70 units a day as advised  . insulin regular (NOVOLIN R RELION) 100 units/mL injection Inject up to 55 units daily as advised  . metFORMIN (GLUCOPHAGE) 1000 MG tablet TAKE ONE TABLET BY  MOUTH TWICE DAILY WITH MEALS  . omeprazole (PRILOSEC) 40 MG capsule Take 1 capsule (40 mg total) by mouth daily.  . valACYclovir (VALTREX) 500 MG tablet   . [DISCONTINUED] HYDROcodone-acetaminophen (NORCO) 10-325 MG per tablet Take 1 tablet by mouth at bedtime as needed and may repeat dose one time if needed for severe pain.  Marland Kitchen etodolac (LODINE) 400 MG tablet Take 400 mg by mouth daily.   Marland Kitchen gabapentin (NEURONTIN) 300 MG capsule   . HYDROcodone-acetaminophen (NORCO) 10-325 MG per tablet Take 1 tablet by mouth at bedtime as needed and may repeat dose one time if needed for severe pain.  . hydrocortisone (ANUSOL-HC) 25 MG suppository Place 1 suppository (25 mg total) rectally 2 (two) times daily. (Patient not taking: Reported on 03/04/2015)  . Lancet Devices (EASY TOUCH LANCING DEVICE) MISC Use to  check diabetes 4 times daily  E 11.65  . lansoprazole (PREVACID) 30 MG capsule Take 1 capsule (30 mg total) by mouth daily at 12 noon. (Patient not taking: Reported on 03/04/2015)  . [DISCONTINUED] medroxyPROGESTERone (DEPO-PROVERA) 150 MG/ML injection Inject 150 mg into the muscle every 3 (three) months.       Orders Placed This Encounter  Procedures  . Pneumococcal conjugate vaccine 13-valent  . Comprehensive metabolic panel  . Lipid panel  . Microalbumin / creatinine urine ratio    Return in about 6 months (around 09/03/2015).

## 2015-03-05 NOTE — Assessment & Plan Note (Addendum)
Well controlled on current regimen. Renal function is due , o changens today.but she did not go to the lab. Lab Results  Component Value Date   CREATININE 0.6 07/30/2014   Lab Results  Component Value Date   NA 137 07/30/2014   K 4.1 07/30/2014   CL 107 07/30/2014   CO2 23 07/30/2014

## 2015-03-05 NOTE — Assessment & Plan Note (Signed)
She is tolerating a high potensity statin without side effects.  She is due for repeat labs.

## 2015-03-05 NOTE — Assessment & Plan Note (Signed)
She has apparently been advised by [facility] orthopedics that she needs lumbar surgery. Her back surgery has been postponed until her diabetes is better controlled.  . I will refill her pain medication today.

## 2015-03-13 LAB — HM DIABETES EYE EXAM

## 2015-03-14 ENCOUNTER — Telehealth: Payer: Self-pay | Admitting: Internal Medicine

## 2015-03-22 ENCOUNTER — Encounter: Payer: Self-pay | Admitting: Internal Medicine

## 2015-03-22 ENCOUNTER — Ambulatory Visit (INDEPENDENT_AMBULATORY_CARE_PROVIDER_SITE_OTHER): Payer: BC Managed Care – PPO | Admitting: Internal Medicine

## 2015-03-22 VITALS — BP 128/80 | HR 98 | Temp 98.5°F | Resp 12 | Wt 180.0 lb

## 2015-03-22 DIAGNOSIS — IMO0001 Reserved for inherently not codable concepts without codable children: Secondary | ICD-10-CM

## 2015-03-22 DIAGNOSIS — E1165 Type 2 diabetes mellitus with hyperglycemia: Secondary | ICD-10-CM

## 2015-03-22 LAB — HEMOGLOBIN A1C: HEMOGLOBIN A1C: 7.9 % — AB (ref 4.6–6.5)

## 2015-03-22 MED ORDER — INSULIN REGULAR HUMAN 100 UNIT/ML IJ SOLN: INTRAMUSCULAR | Status: DC

## 2015-03-22 MED ORDER — INSULIN NPH (HUMAN) (ISOPHANE) 100 UNIT/ML ~~LOC~~ SUSP: SUBCUTANEOUS | Status: DC

## 2015-03-22 NOTE — Progress Notes (Signed)
Patient ID: [name], female   DOB: [birth year], 54 y.o.   MRN: [id number]  HPI: [name] is a 54 y.o.-year-old female, returning for f/u for DM2, dx ~2000, insulin-dependent since 2010, uncontrolled, with complications (PN, + DR). Last visit 1.5 mo ago.  Last hemoglobin A1c was: Lab Results  Component Value Date   HGBA1C 9.4* 01/04/2015   HGBA1C 8.8* 10/04/2014   HGBA1C 10.3* 07/06/2014  Had cortisone inj this month.  Pt was on a regimen of: - Metformin 1000 mg po bid  - Novolin 70/30  35 in am and 65 in pm - can miss doses, but less than before We stopped Glipizide 10 mg bid.  At last visit, we had to switch to: - Metformin 1000 mg po bid  Insulin Before breakfast Before lunch Before dinner  Regular 15 10 >> stopped as she had hypoglycemia 25  NPH 25  45   Pt checks her sugars 2-3x a day and they are very variable - reviewed log: - am:  46 x1, 137-162, 190x1, 302x1 in last week, higher before >> 93-295 >> 76-183, 249 >> 120-239 - 2h after b'fast: 120-195 >> 154 >> n/c > 203 >> n/c - lunch: n/c >> 85, 148, 445 >> 269  >> n/c >> n/c - mid-afternoon: 106-196, 290 >> 118, 208 >> 180 >> 40x1, 78-159, 167 - before dinner: 49, 78, 139-325 >> see above >> 161-243 >> 75-126, 189, 235 >> 36 x1, 46 x1, 81-156, 242 - 2h after dinner: 72, 114-292 >> n/c >> 216, 219, 451 - bedtime: 87-269  >> 82-167 >> 87-196 >> 92-266 >> 84-214, 350 >> 75-288 >> 66, 90-241, 338 >> 118-228 - nighttime: 145-239, 338 Lowest sugar was 36 x1; she has hypoglycemia awareness at 80. Highest sugar was 542 >> 448 >> 350 >> 338 >> 338  - no CKD, last BUN/creatinine:  Lab Results  Component Value Date   BUN 13 07/30/2014   CREATININE 0.6 07/30/2014  Last ACR 0.5 06/19/13. Not on ACEI/ARB.  - last set of lipids: Lab Results  Component Value Date   CHOL 204* 07/30/2014   HDL 39.80 07/30/2014   LDLCALC 146* 07/30/2014   LDLDIRECT 89.7 11/20/2011   TRIG 92.0 07/30/2014   CHOLHDL 5 07/30/2014  She is  on Lipitor.   - last eye exam was in 03/2015. + DR L>R. + glaucoma Dr Arnoldo Morale. - + numbness and tingling in her feet. Seeing her podiatrist.  She also has a history of hypertension, hyperlipidemia-on Lipitor, episodic alcohol abuse, GERD, low back pain, osteoarthritis.   ROS: Constitutional: + weight loss, no fatigue, no subjective hyperthermia/hypothermia Eyes: nol blurry vision, no xerophthalmia ENT: no sore throat, no nodules palpated in throat, no dysphagia/odynophagia, no hoarseness Cardiovascular: no CP/SOB/palpitations/leg swelling Respiratory: no cough/SOB Gastrointestinal: no N/V/D/C Musculoskeletal: no muscle/+joint aches: back, knees Skin: no rashes Neurological: no tremors/numbness/tingling/dizziness  I reviewed pt's medications, allergies, PMH, social hx, family hx, and changes were documented in the history of present illness. Otherwise, unchanged from my initial visit note.  PE: BP 128/80 mmHg  Pulse 98  Temp(Src) 98.5 F (36.9 C) (Oral)  Resp 12  Wt 180 lb (81.647 kg)  SpO2 97% Body mass index is 30.88 kg/(m^2). Wt Readings from Last 3 Encounters:  03/22/15 180 lb (81.647 kg)  03/04/15 186 lb (84.369 kg)  02/07/15 187 lb (84.823 kg)   Constitutional: overweight, in NAD Eyes: PERRLA, EOMI, no exophthalmos ENT: moist mucous membranes, no thyromegaly, no cervical lymphadenopathy Cardiovascular: RRR,  No MRG Respiratory: CTA B Gastrointestinal: abdomen soft, NT, ND, BS+ Musculoskeletal: no deformities, strength intact in all 4 Skin: moist, warm Neurological: no tremor with outstretched hands, DTR normal in all 4  ASSESSMENT: 1. DM2, insulin-dependent, uncontrolled, with complications - peripheral neuropathy - DR   PLAN:  1. Patient with long-standing,uncontrolled diabetes, now on basal-bolus insulin regimen with N and R insulin (limited finances). She is doing better with giving herself the insulin and checking her sugars, but still has spikes especially if  she misses insulin. She has some lows aftre lunch >> will advise her to stay off lunchtime insulin and decrease NPH in am. Since higher sugars at bedtime >> increase dinnertime R insulin. - I advised her to: Patient Instructions   Please continue: - Metformin 1000 mg 2x a day   Please increase the insulin: Insulin Before breakfast Before lunch Before dinner  Regular 15 - 25 >> 35  NPH 25 >> 20 - 45   Please stop at the lab.  Please come back for a follow-up appointment in 3 months with your log.  - continue checking her sugars at different times of the day - check 3 times a day, rotating checks  - she is up-to-date with eye exams  - check hbA1c today - Return to clinic in 3 mo with sugar log   Office Visit on 03/22/2015  Component Date Value Ref Range Status  . HM Diabetic Eye Exam 03/13/2015 Retinopathy* No Retinopathy Final  . Hgb A1c MFr Bld 03/22/2015 7.9* 4.6 - 6.5 % Final   Glycemic Control Guidelines for People with Diabetes:Non Diabetic:  <6%Goal of Therapy: <7%Additional Action Suggested:  >8%    Hemoglobin A1c is much better!!!

## 2015-03-22 NOTE — Patient Instructions (Signed)
Please continue: - Metformin 1000 mg 2x a day   Please increase the insulin: Insulin Before breakfast Before lunch Before dinner  Regular 15 - 25 >> 35  NPH 25 >> 20 - 45   Please stop at the lab.  Please come back for a follow-up appointment in 3 months with your log.

## 2015-04-10 ENCOUNTER — Encounter: Payer: Self-pay | Admitting: Internal Medicine

## 2015-05-06 ENCOUNTER — Other Ambulatory Visit: Payer: Self-pay | Admitting: Internal Medicine

## 2015-05-20 ENCOUNTER — Telehealth: Payer: Self-pay | Admitting: Internal Medicine

## 2015-05-20 NOTE — Telephone Encounter (Signed)
Pt returning call

## 2015-05-20 NOTE — Telephone Encounter (Signed)
Pt is going to her insurance company to see if she can get a cheaper Insulin.Once she calls back with info she would like an RX for that Insulin

## 2015-05-20 NOTE — Telephone Encounter (Signed)
Returning pt call but VM not set up on phone.Will try again later today.

## 2015-05-20 NOTE — Telephone Encounter (Signed)
Pt needs a phone call back about her insulin

## 2015-05-22 NOTE — Telephone Encounter (Signed)
Pt is coming in for a free sample voucher for cheaper insulin.

## 2015-05-30 ENCOUNTER — Telehealth: Payer: Self-pay | Admitting: Internal Medicine

## 2015-05-30 NOTE — Telephone Encounter (Signed)
Spoke with pt. She stated that whatever medication got sent in, she cannot afford. She was not at home to look at her med so we could discuss it. She will get what she is taking and how much; I will call her in the AM to discuss. Pt voiced understanding.

## 2015-05-30 NOTE — Telephone Encounter (Signed)
Call pt about medications

## 2015-05-31 ENCOUNTER — Telehealth: Payer: Self-pay | Admitting: *Deleted

## 2015-06-05 NOTE — Telephone Encounter (Signed)
Encounter opened in error

## 2015-06-05 NOTE — Telephone Encounter (Signed)
Calle dpt on 7/1. Pt was unable to talk at that time. Pt was to return call and did not.

## 2015-06-06 ENCOUNTER — Other Ambulatory Visit: Payer: Self-pay | Admitting: Internal Medicine

## 2015-06-06 ENCOUNTER — Telehealth: Payer: Self-pay | Admitting: Internal Medicine

## 2015-06-06 NOTE — Telephone Encounter (Signed)
Please call pt back she has info to give you

## 2015-06-07 ENCOUNTER — Other Ambulatory Visit: Payer: Self-pay | Admitting: *Deleted

## 2015-06-07 MED ORDER — INSULIN REGULAR HUMAN 100 UNIT/ML IJ SOLN: INTRAMUSCULAR | Status: DC

## 2015-06-07 MED ORDER — INSULIN NPH (HUMAN) (ISOPHANE) 100 UNIT/ML ~~LOC~~ SUSP: SUBCUTANEOUS | Status: DC

## 2015-06-07 NOTE — Telephone Encounter (Signed)
Called pt and she asked me to send in the Novolin N and R in the Relion brand to see if she can afford.

## 2015-06-18 ENCOUNTER — Ambulatory Visit (INDEPENDENT_AMBULATORY_CARE_PROVIDER_SITE_OTHER): Payer: BC Managed Care – PPO | Admitting: Internal Medicine

## 2015-06-18 ENCOUNTER — Ambulatory Visit (INDEPENDENT_AMBULATORY_CARE_PROVIDER_SITE_OTHER)
Admission: RE | Admit: 2015-06-18 | Discharge: 2015-06-18 | Disposition: A | Discharge status: Home / Self Care | Payer: BC Managed Care – PPO | Source: Ambulatory Visit | Attending: Internal Medicine | Admitting: Internal Medicine

## 2015-06-18 ENCOUNTER — Encounter: Payer: Self-pay | Admitting: Internal Medicine

## 2015-06-18 VITALS — BP 128/78 | HR 77 | Temp 97.9°F | Resp 14 | Ht 64.0 in | Wt 189.2 lb

## 2015-06-18 DIAGNOSIS — M25562 Pain in left knee: Secondary | ICD-10-CM | POA: Diagnosis not present

## 2015-06-18 DIAGNOSIS — IMO0001 Reserved for inherently not codable concepts without codable children: Secondary | ICD-10-CM

## 2015-06-18 DIAGNOSIS — B372 Candidiasis of skin and nail: Secondary | ICD-10-CM | POA: Diagnosis not present

## 2015-06-18 DIAGNOSIS — L732 Hidradenitis suppurativa: Secondary | ICD-10-CM | POA: Diagnosis not present

## 2015-06-18 DIAGNOSIS — G8929 Other chronic pain: Secondary | ICD-10-CM | POA: Diagnosis not present

## 2015-06-18 DIAGNOSIS — B9689 Other specified bacterial agents as the cause of diseases classified elsewhere: Secondary | ICD-10-CM

## 2015-06-18 DIAGNOSIS — L0889 Other specified local infections of the skin and subcutaneous tissue: Secondary | ICD-10-CM | POA: Diagnosis not present

## 2015-06-18 DIAGNOSIS — E1165 Type 2 diabetes mellitus with hyperglycemia: Secondary | ICD-10-CM

## 2015-06-18 DIAGNOSIS — E1169 Type 2 diabetes mellitus with other specified complication: Secondary | ICD-10-CM

## 2015-06-18 DIAGNOSIS — E785 Hyperlipidemia, unspecified: Secondary | ICD-10-CM

## 2015-06-18 DIAGNOSIS — G5620 Lesion of ulnar nerve, unspecified upper limb: Secondary | ICD-10-CM

## 2015-06-18 MED ORDER — HYDROCODONE-ACETAMINOPHEN 10-325 MG PO TABS: 1.0000 | ORAL_TABLET | Freq: Every evening | ORAL | Status: DC | PRN

## 2015-06-18 MED ORDER — SULFAMETHOXAZOLE-TRIMETHOPRIM 800-160 MG PO TABS: 1.0000 | ORAL_TABLET | Freq: Two times a day (BID) | ORAL | Status: DC

## 2015-06-18 MED ORDER — NYSTATIN 100000 UNIT/GM EX POWD: CUTANEOUS | Status: DC

## 2015-06-18 NOTE — Progress Notes (Signed)
Pre-visit discussion using our clinic review tool. No additional management support is needed unless otherwise documented below in the visit note.  

## 2015-06-18 NOTE — Progress Notes (Signed)
Subjective:  Patient ID: [name], female    DOB: [birth year]  Age: 54 y.o. MRN: [id number]  CC: The primary encounter diagnosis was Candidiasis of skin. Diagnoses of Knee pain, chronic, left, Chronic bacterial skin infection, Left knee pain, HIDRADENITIS SUPPURATIVA, Diabetes mellitus type 2, uncontrolled, without complications, Ulnar nerve compression, unspecified laterality, and Hyperlipidemia associated with type 2 diabetes mellitus were also pertinent to this visit.  HPI [name] presents for evaluatio nof multiple complaints:  1) chronic pain in the   Left knee . The pain is on the  medial side and is aggravated by standing up    Not particularly aggravated by prolonged standing but the pain will "hit her" at various times and last foe several hours.  She has no history of trauma. She also  describes legs cramps in both legs.  The pain has been  present on and off for two months and is worse with standing.  2) Recurrent skin boils in the perineal area,  Worried that is is herpes since it was diagnosed with it by blood test several months ago.  3) DM: now managed by Dr Renne Crigler.  Blood sugars are better but she is having difficutly affording the medication Dr Renne Crigler has prescribed.  Unaware that she has an appointment scheduled for 7/20 with her.    Outpatient Prescriptions Prior to Visit  Medication Sig Dispense Refill  . atorvastatin (LIPITOR) 40 MG tablet TAKE ONE TABLET BY MOUTH ONCE DAILY 30 tablet 6  . BAYER CONTOUR TEST test strip TEST BLOOD SUGARS 3 TIMES DAILY 100 each 0  . etodolac (LODINE) 400 MG tablet Take 400 mg by mouth daily.     Marland Kitchen gabapentin (NEURONTIN) 300 MG capsule     . hydrocortisone (ANUSOL-HC) 25 MG suppository Place 1 suppository (25 mg total) rectally 2 (two) times daily. 12 suppository 0  . insulin NPH Human (NOVOLIN N RELION) 100 UNIT/ML injection Inject up to 65 units a day as advised 30 mL 2  . insulin regular (NOVOLIN R RELION) 100 units/mL  injection Inject up to 50 units daily as advised 20 mL 2  . Lancet [employer] (EASY TOUCH LANCING DEVICE) MISC Use to check diabetes 4 times daily  E 11.65 1 each 11  . lansoprazole (PREVACID) 30 MG capsule Take 1 capsule (30 mg total) by mouth daily at 12 noon. 30 capsule 2  . metFORMIN (GLUCOPHAGE) 1000 MG tablet TAKE ONE TABLET BY MOUTH TWICE DAILY WITH MEALS 60 tablet 5  . Naproxen-Esomeprazole 500-20 MG TBEC Take 1 tablet by mouth daily.    Marland Kitchen omeprazole (PRILOSEC) 40 MG capsule Take 1 capsule (40 mg total) by mouth daily. 30 capsule 5  . valACYclovir (VALTREX) 500 MG tablet     . HYDROcodone-acetaminophen (NORCO) 10-325 MG per tablet Take 1 tablet by mouth at bedtime as needed and may repeat dose one time if needed for severe pain. 60 tablet 0   No facility-administered medications prior to visit.    Review of Systems;  Patient denies headache, fevers, malaise, unintentional weight loss, skin rash, eye pain, sinus congestion and sinus pain, sore throat, dysphagia,  hemoptysis , cough, dyspnea, wheezing, chest pain, palpitations, orthopnea, edema, abdominal pain, nausea, melena, diarrhea, constipation, flank pain, dysuria, hematuria, urinary  Frequency, nocturia, numbness, tingling, seizures,  Focal weakness, Loss of consciousness,  Tremor, insomnia, depression, anxiety, and suicidal ideation.      Objective:  BP 128/78 mmHg  Pulse 77  Temp(Src) 97.9 F (36.6 C) (Oral)  Resp 14  Ht 5\' 4"  (1.626 m)  Wt 189 lb 4 oz (85.843 kg)  BMI 32.47 kg/m2  SpO2 99%  BP Readings from Last 3 Encounters:  06/18/15 128/78  03/22/15 128/80  03/04/15 144/82    Wt Readings from Last 3 Encounters:  06/18/15 189 lb 4 oz (85.843 kg)  03/22/15 180 lb (81.647 kg)  03/04/15 186 lb (84.369 kg)    General appearance: alert, cooperative and appears stated age Ears: normal TM's and external ear canals both ears Throat: lips, mucosa, and tongue normal; teeth and gums normal Neck: no adenopathy, no  carotid bruit, supple, symmetrical, trachea midline and thyroid not enlarged, symmetric, no tenderness/mass/nodules Back: symmetric, no curvature. ROM normal. No CVA tenderness. Lungs: clear to auscultation bilaterally Heart: regular rate and rhythm, S1, S2 normal, no murmur, click, rub or gallop Abdomen: soft, non-tender; bowel sounds normal; no masses,  no organomegaly Pulses: 2+ and symmetric Skin: Skin color, texture, turgor normal. No rashes or lesions Lymph nodes: Cervical, supraclavicular, and axillary nodes normal.  Lab Results  Component Value Date   HGBA1C 7.9* 03/22/2015   HGBA1C 9.4* 01/04/2015   HGBA1C 8.8* 10/04/2014    Lab Results  Component Value Date   CREATININE 0.6 07/30/2014   CREATININE 0.66 05/11/2014   CREATININE 0.5 09/20/2013    Lab Results  Component Value Date   HGB 10.5* 05/14/2014   GLUCOSE 289* 07/30/2014   CHOL 204* 07/30/2014   TRIG 92.0 07/30/2014   HDL 39.80 07/30/2014   LDLDIRECT 89.7 11/20/2011   LDLCALC 146* 07/30/2014   ALT 13 07/30/2014   AST 15 07/30/2014   NA 137 07/30/2014   K 4.1 07/30/2014   CL 107 07/30/2014   CREATININE 0.6 07/30/2014   BUN 13 07/30/2014   CO2 23 07/30/2014   TSH 1.07 10/04/2014   HGBA1C 7.9* 03/22/2015   MICROALBUR 0.4 07/30/2014    No results found.  Assessment & Plan:   Problem List Items Addressed This Visit      Unprioritized   Diabetes mellitus type 2, uncontrolled, without complications    Patient advised to keep appointment with Dr Renne Crigler and discuss her medications to look for less costly management strategy       Hyperlipidemia associated with type 2 diabetes mellitus    She is tolerating a high potensity statin without side effects.  She is overdue for repeat labs.    Lab Results  Component Value Date   CHOL 204* 07/30/2014   HDL 39.80 07/30/2014   LDLCALC 146* 07/30/2014   LDLDIRECT 89.7 11/20/2011   TRIG 92.0 07/30/2014   CHOLHDL 5 07/30/2014   Lab Results  Component Value  Date   ALT 13 07/30/2014   AST 15 07/30/2014   ALKPHOS 92 07/30/2014   BILITOT 0.4 07/30/2014         HIDRADENITIS SUPPURATIVA    Her perineal furuncles do not appear herpetic.  Septra DS x 1 week prescribed.       Ulnar nerve compression    S/p ulnar nerve release June 2015, left elbow, and median nerve release.        Left knee pain    Chronic , medial side,  No clear history of trauma or overuse ,  Not giving way,  Plain films done today show not acute injuries and only mild spurring.  Given her histor yof PUD, need to avoid NSAIDs but I am concerned about her reliance on vicodin for various orthopedic complaints.   Referring to sports  medicine for evaluation of knee.        Candidiasis of skin - Primary   Relevant Medications   nystatin (MYCOSTATIN/NYSTOP) 100000 UNIT/GM POWD   sulfamethoxazole-trimethoprim (BACTRIM DS,SEPTRA DS) 800-160 MG per tablet    Other Visit Diagnoses    Knee pain, chronic, left        Relevant Orders    DG Knee Complete 4 Views Left (Completed)    AMB referral to sports medicine    Chronic bacterial skin infection        Relevant Medications    nystatin (MYCOSTATIN/NYSTOP) 100000 UNIT/GM POWD    sulfamethoxazole-trimethoprim (BACTRIM DS,SEPTRA DS) 800-160 MG per tablet     A total of 25 minutes of face to face time was spent with patient more than half of which was spent in counselling and coordination of care    I am having Ms. Henery start on nystatin and sulfamethoxazole-trimethoprim. I am also having her maintain her etodolac, hydrocortisone, lansoprazole, gabapentin, BAYER CONTOUR TEST, valACYclovir, omeprazole, metFORMIN, EASY TOUCH LANCING DEVICE, Naproxen-Esomeprazole, atorvastatin, insulin NPH Human, insulin regular, and HYDROcodone-acetaminophen.  Meds ordered this encounter  Medications  . HYDROcodone-acetaminophen (NORCO) 10-325 MG per tablet    Sig: Take 1 tablet by mouth at bedtime as needed and may repeat dose one time if  needed for severe pain.    Dispense:  60 tablet    Refill:  0    May refill on or after March 04, 2015  . nystatin (MYCOSTATIN/NYSTOP) 100000 UNIT/GM POWD    Sig: Apply powder to affected area twice daily until rash has resolved    Dispense:  15 g    Refill:  3  . sulfamethoxazole-trimethoprim (BACTRIM DS,SEPTRA DS) 800-160 MG per tablet    Sig: Take 1 tablet by mouth 2 (two) times daily.    Dispense:  14 tablet    Refill:  0    Medications Discontinued During This Encounter  Medication Reason  . HYDROcodone-acetaminophen (NORCO) 10-325 MG per tablet Reorder    Follow-up: No Follow-up on file.   Crecencio Mc, MD

## 2015-06-18 NOTE — Patient Instructions (Signed)
I am prescribing Nystatin powder to use twice daily under your breasts to treat the yeast infection called candida  Once it resolves, start using  Gold Bond Powder  Daily to keep it from coming back.    I am refilling the vicodin this time for  Your knee pain , please get the x rays I ordered at [facility].    I am prescribing Septra DS for your recurrent skin infection .  Please start using Dial soap  on your private parts to prevent it from coming back and use Hibiclens once a week.

## 2015-06-19 DIAGNOSIS — M25562 Pain in left knee: Secondary | ICD-10-CM | POA: Insufficient documentation

## 2015-06-19 NOTE — Assessment & Plan Note (Signed)
Her perineal furuncles do not appear herpetic.  Septra DS x 1 week prescribed.

## 2015-06-19 NOTE — Assessment & Plan Note (Signed)
Patient advised to keep appointment with Dr Renne Crigler and discuss her medications to look for less costly management strategy

## 2015-06-19 NOTE — Assessment & Plan Note (Signed)
Chronic , medial side,  No clear history of trauma or overuse ,  Not giving way,  Plain films done today show not acute injuries and only mild spurring.  Given her histor yof PUD, need to avoid NSAIDs but I am concerned about her reliance on vicodin for various orthopedic complaints.   Referring to sports medicine for evaluation of knee.

## 2015-06-19 NOTE — Assessment & Plan Note (Signed)
She is tolerating a high potensity statin without side effects.  She is overdue for repeat labs.    Lab Results  Component Value Date   CHOL 204* 07/30/2014   HDL 39.80 07/30/2014   LDLCALC 146* 07/30/2014   LDLDIRECT 89.7 11/20/2011   TRIG 92.0 07/30/2014   CHOLHDL 5 07/30/2014   Lab Results  Component Value Date   ALT 13 07/30/2014   AST 15 07/30/2014   ALKPHOS 92 07/30/2014   BILITOT 0.4 07/30/2014

## 2015-06-19 NOTE — Assessment & Plan Note (Addendum)
S/p ulnar nerve release June 2015, left elbow, and median nerve release.

## 2015-06-21 ENCOUNTER — Ambulatory Visit (INDEPENDENT_AMBULATORY_CARE_PROVIDER_SITE_OTHER): Payer: BC Managed Care – PPO | Admitting: Internal Medicine

## 2015-06-21 ENCOUNTER — Encounter: Payer: Self-pay | Admitting: Internal Medicine

## 2015-06-21 ENCOUNTER — Other Ambulatory Visit (INDEPENDENT_AMBULATORY_CARE_PROVIDER_SITE_OTHER): Payer: BC Managed Care – PPO | Admitting: *Deleted

## 2015-06-21 VITALS — BP 124/76 | HR 98 | Temp 97.9°F | Resp 12 | Wt 186.0 lb

## 2015-06-21 DIAGNOSIS — E1165 Type 2 diabetes mellitus with hyperglycemia: Secondary | ICD-10-CM

## 2015-06-21 DIAGNOSIS — IMO0001 Reserved for inherently not codable concepts without codable children: Secondary | ICD-10-CM

## 2015-06-21 LAB — POCT GLYCOSYLATED HEMOGLOBIN (HGB A1C): Hemoglobin A1C: 7.7

## 2015-06-21 MED ORDER — GLUCOSE BLOOD VI STRP: ORAL_STRIP | Status: DC

## 2015-06-21 NOTE — Patient Instructions (Signed)
Please continue: - Metformin 1000 mg 2x a day   Please increase the insulin: Insulin Before breakfast Before lunch Before dinner  Regular 15 - 25 >> 35  NPH 25 >> 20 - 45   Please come back for a follow-up appointment in 3 months with your log.

## 2015-06-21 NOTE — Progress Notes (Signed)
Patient ID: [name], female   DOB: [birth year], 54 y.o.   MRN: [id number]  HPI: [name] is a 54 y.o.-year-old female, returning for f/u for DM2, dx ~2000, insulin-dependent since 2010, uncontrolled, with complications (PN, + DR). Last visit 3 mo ago.  Last hemoglobin A1c was: Lab Results  Component Value Date   HGBA1C 7.9* 03/22/2015   HGBA1C 9.4* 01/04/2015   HGBA1C 8.8* 10/04/2014  Had cortisone inj this month.  Pt was on a regimen of: - Metformin 1000 mg po bid  - Novolin 70/30  35 in am and 65 in pm - can miss doses, but less than before We stopped Glipizide 10 mg bid.  At last visit, we had to switch to: - Metformin 1000 mg po bid  She was not taking her insulins as advised: Insulin Before breakfast Before lunch Before dinner  Regular 15 - 25 (did not increase to 35!)  NPH 25 (did not decrease to 20!) - 45  She ran out of insulin 3 days ago.   Pt checks her sugars 2-3x a day and they are very variable - reviewed log: - am:  93-295 >> 76-183, 249 >> 120-239 - 2h after b'fast: 120-195 >> 154 >> n/c > 203 >> n/c - lunch: n/c >> 85, 148, 445 >> 269  >> n/c >> n/c  - mid-afternoon: 106-196, 290 >> 118, 208 >> 180 >> 40x1, 78-159, 167 >> n/c - before dinner: 75-126, 189, 235 >> 36 x1, 46 x1, 81-156, 242 >> 60, 98-230, 255  - 2h after dinner: 72, 114-292 >> n/c >> 216, 219, 451 >> n/c - bedtime: 92-266 >> 84-214, 350 >> 75-288 >> 66, 90-241, 338 >> 118-228 >> 158-255, 296 - nighttime: 145-239, 338 Lowest sugar was 36 x1 >> 60; she has hypoglycemia awareness at 80. Highest sugar was 408.  - no CKD, last BUN/creatinine:  Lab Results  Component Value Date   BUN 13 07/30/2014   CREATININE 0.6 07/30/2014  Last ACR 0.5 06/19/13. Not on ACEI/ARB.  - last set of lipids: Lab Results  Component Value Date   CHOL 204* 07/30/2014   HDL 39.80 07/30/2014   LDLCALC 146* 07/30/2014   LDLDIRECT 89.7 11/20/2011   TRIG 92.0 07/30/2014   CHOLHDL 5 07/30/2014  She is on  Lipitor.   - last eye exam was in 03/2015. + DR L>R. + glaucoma Dr Arnoldo Morale. - + numbness and tingling in her feet. Seeing her podiatrist.  She also has a history of hypertension, hyperlipidemia-on Lipitor, episodic alcohol abuse, GERD, low back pain, osteoarthritis.   ROS: Constitutional: no weight loss, no fatigue, + subjective hyperthermia and hypothermia Eyes: nol blurry vision, no xerophthalmia ENT: no sore throat, no nodules palpated in throat, no dysphagia/odynophagia, no hoarseness Cardiovascular: no CP/SOB/palpitations/leg swelling Respiratory: no cough/SOB Gastrointestinal: no N/V/D/C Musculoskeletal: + muscle/+joint aches: back, knees Skin: no rashes Neurological: no tremors/numbness/tingling/dizziness  I reviewed pt's medications, allergies, PMH, social hx, family hx, and changes were documented in the history of present illness. Otherwise, unchanged from my initial visit note.  PE: BP 124/76 mmHg  Pulse 98  Temp(Src) 97.9 F (36.6 C) (Oral)  Resp 12  Wt 186 lb (84.369 kg)  SpO2 99% Body mass index is 31.91 kg/(m^2). Wt Readings from Last 3 Encounters:  06/21/15 186 lb (84.369 kg)  06/18/15 189 lb 4 oz (85.843 kg)  03/22/15 180 lb (81.647 kg)   Constitutional: overweight, in NAD Eyes: PERRLA, EOMI, no exophthalmos ENT: moist mucous membranes, no thyromegaly,  no cervical lymphadenopathy Cardiovascular: RRR, No MRG Respiratory: CTA B Gastrointestinal: abdomen soft, NT, ND, BS+ Musculoskeletal: no deformities, strength intact in all 4 Skin: moist, warm Neurological: no tremor with outstretched hands, DTR normal in all 4  ASSESSMENT: 1. DM2, insulin-dependent, uncontrolled, with complications - peripheral neuropathy - DR   PLAN:  1. Patient with long-standing,uncontrolled diabetes, now on basal-bolus insulin regimen with N and R insulin (limited finances). I explained that this is the cheapest form of insulin she can get - so she will need stick with this.At  last visit, as she had some lows after lunch >> will advise her to stay off lunchtime insulin and decrease NPH in am. Since higher sugars at bedtime >> increase dinnertime R insulin. She did not follow these instructions >> will change it now. Will also go today to pick up her insulins. - I advised her to: Patient Instructions   Please continue: - Metformin 1000 mg 2x a day   Please increase the insulin: Insulin Before breakfast Before lunch Before dinner  Regular 15 - 25 >> 35  NPH 25 >> 20 - 45   Please come back for a follow-up appointment in 3 months with your log.  - continue checking her sugars at different times of the day - check 3 times a day, rotating checks  - she is up-to-date with eye exams  - check hbA1c today >> 7.7% (a little better!) - Return to clinic in 3 mo with sugar log

## 2015-07-10 ENCOUNTER — Other Ambulatory Visit: Payer: Self-pay | Admitting: Internal Medicine

## 2015-08-19 ENCOUNTER — Other Ambulatory Visit (INDEPENDENT_AMBULATORY_CARE_PROVIDER_SITE_OTHER): Payer: BC Managed Care – PPO

## 2015-08-19 ENCOUNTER — Encounter: Payer: Self-pay | Admitting: Family Medicine

## 2015-08-19 ENCOUNTER — Ambulatory Visit (INDEPENDENT_AMBULATORY_CARE_PROVIDER_SITE_OTHER): Payer: BC Managed Care – PPO | Admitting: Family Medicine

## 2015-08-19 VITALS — BP 134/82 | HR 95 | Ht 64.0 in | Wt 186.0 lb

## 2015-08-19 DIAGNOSIS — M25562 Pain in left knee: Secondary | ICD-10-CM

## 2015-08-19 DIAGNOSIS — M23204 Derangement of unspecified medial meniscus due to old tear or injury, left knee: Secondary | ICD-10-CM

## 2015-08-19 DIAGNOSIS — M23209 Derangement of unspecified meniscus due to old tear or injury, unspecified knee: Secondary | ICD-10-CM | POA: Insufficient documentation

## 2015-08-19 DIAGNOSIS — M23201 Derangement of unspecified lateral meniscus due to old tear or injury, left knee: Secondary | ICD-10-CM | POA: Diagnosis not present

## 2015-08-19 DIAGNOSIS — M23207 Derangement of unspecified meniscus due to old tear or injury, left knee: Secondary | ICD-10-CM | POA: Diagnosis not present

## 2015-08-19 MED ORDER — DICLOFENAC SODIUM 2 % TD SOLN: TRANSDERMAL | Status: DC

## 2015-08-19 NOTE — Progress Notes (Signed)
[name] [name], [place] [address] Phone: [phone] Subjective:    I'm seeing this patient by the request  of:  TULLO, TERESA L, MD   CC: Left knee pain  [id number] [name] is a 54 y.o. female coming in with complaint of left knee pain. Patient does not remember any specific injury. States that going up and down stairs can be painful. Seems to be on the medial aspect of the knee. Symptoms have the catching sensation. Never given out on her. Denies any swelling significantly. Patient states though that he can even hurt at night. Rates the severity of pain is 8 out of 10. Has not responded to over-the-counter medication she's tried at home. Patient down the leg but states that seems to be starting to go up towards her back and hip.  She did have x-rays previously. These were reviewed by me. Patient's x-rays show no significant bony abnormality. No early signs of arthritis.      Past Medical History  Diagnosis Date  . Hyperlipidemia   . Diabetes mellitus   . Gastritis with bleeding due to alcohol 2010    [facility] admission  . Campylobacter enteritis May 2012    [facility] admission  . Arthritis   . Carpal tunnel syndrome, bilateral   . Hypertension     no meds  . Wears glasses   . GERD (gastroesophageal reflux disease)    Past Surgical History  Procedure Laterality Date  . Endometrial biopsy  2004    benign  . Tonsillectomy    . Foot osteotomy  2010    right-spurs  . Cyst excision  1995    under arms  . Carpal tunnel release Left 05/14/2014    Procedure: LEFT ULNAR NEUROPLASTY AT ELBOW AND ENDOSCOPIC CARPAL TUNNEL RELEASE;  Surgeon: Jolyn Nap, MD;  Location: [facility];  Service: Orthopedics;  Laterality: Left;  . Ulnar nerve transposition Left 05/14/2014    Procedure: ULNAR NERVE DECOMPRESSION/TRANSPOSITION;  Surgeon: Jolyn Nap, MD;  Location: [facility];  Service: Orthopedics;   Laterality: Left;   Social History  Substance Use Topics  . Smoking status: Former Smoker    Quit date: 05/09/2012  . Smokeless tobacco: Never Used  . Alcohol Use: No     Comment: not for 5 yr   Allergies  Allergen Reactions  . Oxycodone-Acetaminophen     REACTION: Unknown reaction  . Penicillins Rash   Family History  Problem Relation Age of Onset  . Hypertension Mother   . Stroke Father   . Diabetes Maternal Grandmother   . Hypertension Maternal Grandmother      Past medical history, social, surgical and family history all reviewed in electronic medical record.   Review of Systems: No headache, visual changes, nausea, vomiting, diarrhea, constipation, dizziness, abdominal pain, skin rash, fevers, chills, night sweats, weight loss, swollen lymph nodes, body aches, joint swelling, muscle aches, chest pain, shortness of breath, mood changes.   Objective Blood pressure 134/82, pulse 95, height 5\' 4"  (1.626 m), weight 186 lb (84.369 kg), SpO2 97 %.  General: No apparent distress alert and oriented x3 mood and affect normal, dressed appropriately. Lots of energy HEENT: Pupils equal, extraocular movements intact  Respiratory: Patient's speak in full sentences and does not appear short of breath  Cardiovascular: No lower extremity edema, non tender, no erythema  Skin: Warm dry intact with no signs of infection or rash on extremities or on  axial skeleton.  Abdomen: Soft nontender  Neuro: Cranial nerves II through XII are intact, neurovascularly intact in all extremities with 2+ DTRs and 2+ pulses.  Lymph: No lymphadenopathy of posterior or anterior cervical chain or axillae bilaterally.  Gait normal with good balance and coordination.  MSK:  Non tender with full range of motion and good stability and symmetric strength and tone of shoulders, elbows, wrist, hip, and ankles bilaterally.  Knee: Left Normal to inspection with no erythema or effusion or obvious bony abnormalities. Mild  atrophy of the musculature of the thighs bilaterally Tender over the medial joint line ROM full in flexion and extension and lower leg rotation. Ligaments with solid consistent endpoints including ACL, PCL, LCL, MCL. Positive Mcmurray's, Apley's, and Thessalonian tests. Mild painful patellar compression. Patellar glide without crepitus. Patellar and quadriceps tendons unremarkable. Hamstring and quadriceps strength is normal.   contralateral knee unremarkable except for atrophy of the musculature that is symmetric to contralateral side.   MSK US performed of: Left knee This study was ordered, performed, and interpreted by Charlann Boxer D.O.  Knee: All structures visualized. Chronic meniscal tear noted of the medial meniscus with small protrusion of the posterior medial aspect. Patellar Tendon unremarkable on long and transverse views without effusion. No abnormality of prepatellar bursa. LCL and MCL unremarkable on long and transverse views. No abnormality of origin of medial or lateral head of the gastrocnemius.  IMPRESSION: Chronic meniscal tear  Procedure note 16109; 15 minutes spent for Therapeutic exercises as stated in above notes.  This included exercises focusing on stretching, strengthening, with significant focus on eccentric aspects.  Vastus medialis obliques strengthening exercises as well as hamstring exercises were given. Discuss eccentric exercises as well as squats, wall sits, biking. Proper technique shown and discussed handout in great detail with ATC.  All questions were discussed and answered.     Impression and Recommendations:     This case required medical decision making of moderate complexity.

## 2015-08-19 NOTE — Assessment & Plan Note (Signed)
Chronic meniscal tear noted. Some mild hypoechoic changes but no severe changes noted. We discussed home exercises, patient work with Product/process development scientist, we'll do icing. Declined formal physical therapy as well as bracing. Patient try topical anti-inflammatory as we discussed over-the-counter natural supplements that be safe and effective. Patient and will come back and see me again in 4 weeks for further evaluation. Continuing to have pain I would consider injection and formal physical therapy.

## 2015-08-19 NOTE — Patient Instructions (Addendum)
Good to see you.  Ice 20 minutes 2 times daily. Usually after activity and before bed. Exercises 3 times a week.  Vitamin D 2000 IU daily turmeric 500mg  daily B12 1046mcg daily Iron 65mg  elemental daily or at least 3 times a week.  pennsaid pinkie amount topically 2 times daily as needed.  See me again in 4 weeks.

## 2015-08-19 NOTE — Progress Notes (Signed)
Pre visit review using our clinic review tool, if applicable. No additional management support is needed unless otherwise documented below in the visit note. 

## 2015-09-16 ENCOUNTER — Ambulatory Visit: Payer: BC Managed Care – PPO | Admitting: Family Medicine

## 2015-09-19 ENCOUNTER — Ambulatory Visit (INDEPENDENT_AMBULATORY_CARE_PROVIDER_SITE_OTHER): Payer: BC Managed Care – PPO | Admitting: Internal Medicine

## 2015-09-19 ENCOUNTER — Encounter: Payer: Self-pay | Admitting: Internal Medicine

## 2015-09-19 ENCOUNTER — Other Ambulatory Visit (INDEPENDENT_AMBULATORY_CARE_PROVIDER_SITE_OTHER): Payer: BC Managed Care – PPO | Admitting: *Deleted

## 2015-09-19 VITALS — BP 132/76 | HR 108 | Temp 98.2°F | Resp 12 | Wt 175.0 lb

## 2015-09-19 DIAGNOSIS — E1165 Type 2 diabetes mellitus with hyperglycemia: Secondary | ICD-10-CM

## 2015-09-19 DIAGNOSIS — Z794 Long term (current) use of insulin: Secondary | ICD-10-CM | POA: Diagnosis not present

## 2015-09-19 DIAGNOSIS — IMO0001 Reserved for inherently not codable concepts without codable children: Secondary | ICD-10-CM

## 2015-09-19 DIAGNOSIS — Z23 Encounter for immunization: Secondary | ICD-10-CM

## 2015-09-19 LAB — POCT GLYCOSYLATED HEMOGLOBIN (HGB A1C): Hemoglobin A1C: 11.2

## 2015-09-19 MED ORDER — INSULIN REGULAR HUMAN 100 UNIT/ML IJ SOLN: INTRAMUSCULAR | Status: DC

## 2015-09-19 MED ORDER — INSULIN NPH (HUMAN) (ISOPHANE) 100 UNIT/ML ~~LOC~~ SUSP: SUBCUTANEOUS | Status: DC

## 2015-09-19 NOTE — Progress Notes (Signed)
Patient ID: [name], female   DOB: Jun 06, 1961, 54 y.o.   MRN: [id number]  HPI: [name] is a 54 y.o.-year-old female, returning for f/u for DM2, dx ~2000, insulin-dependent since 2010, uncontrolled, with complications (PN, + DR). Last visit 3 mo ago.  She is usually not compliant with her DM regimen. She does not take it as prescribed and can run out of insulin. Today, she comes back w/o a log or meter and has been out of insulin for a week.  She lost 11 lbs since last visit.  Last hemoglobin A1c was: Lab Results  Component Value Date   HGBA1C 7.7 06/21/2015   HGBA1C 7.9* 03/22/2015   HGBA1C 9.4* 01/04/2015  Had cortisone inj this month.  Pt was on a regimen of: - Metformin 1000 mg po bid  - Novolin 70/30  35 in am and 65 in pm - can miss doses, but less than before We stopped Glipizide 10 mg bid.  We had to switch to: - Metformin 1000 mg po bid  Insulin Before breakfast Before lunch Before dinner  Regular 15 - 25 >> 35  NPH 25 >> 20 - 45   Pt checks her sugars 2-3x a day and they are very variable - no log - did not check sugars. Reviewed sugars from last visit: - am:  93-295 >> 76-183, 249 >> 120-239 - 2h after b'fast: 120-195 >> 154 >> n/c > 203 >> n/c - lunch: n/c >> 85, 148, 445 >> 269  >> n/c >> n/c  - mid-afternoon: 106-196, 290 >> 118, 208 >> 180 >> 40x1, 78-159, 167 >> n/c - before dinner: 75-126, 189, 235 >> 36 x1, 46 x1, 81-156, 242 >> 60, 98-230, 255  - 2h after dinner: 72, 114-292 >> n/c >> 216, 219, 451 >> n/c - bedtime: 92-266 >> 84-214, 350 >> 75-288 >> 66, 90-241, 338 >> 118-228 >> 158-255, 296 - nighttime: 145-239, 338 Lowest sugar was 36 x1 >> 60; she has hypoglycemia awareness at 80. Highest sugar was 408.  - no CKD, last BUN/creatinine:  Lab Results  Component Value Date   BUN 13 07/30/2014   CREATININE 0.6 07/30/2014  Last ACR 0.5 06/19/13. Not on ACEI/ARB.  - last set of lipids: Lab Results  Component Value Date   CHOL 204*  07/30/2014   HDL 39.80 07/30/2014   LDLCALC 146* 07/30/2014   LDLDIRECT 89.7 11/20/2011   TRIG 92.0 07/30/2014   CHOLHDL 5 07/30/2014  She is on Lipitor.   - last eye exam was in 03/2015. + DR L>R. + glaucoma Dr Arnoldo Morale. - + numbness and tingling in her feet. Seeing her podiatrist.  She also has a history of hypertension, hyperlipidemia-on Lipitor, episodic alcohol abuse, GERD, low back pain, osteoarthritis.   ROS: Constitutional: no weight loss, + fatigue, + subjective hyperthermia and hypothermia Eyes: nol blurry vision, no xerophthalmia ENT: no sore throat, no nodules palpated in throat, no dysphagia/odynophagia, no hoarseness Cardiovascular: no CP/SOB/palpitations/leg swelling Respiratory: no cough/SOB Gastrointestinal: no N/V/D/C Musculoskeletal: + muscle/+ joint aches Skin: no rashes Neurological: no tremors/numbness/tingling/dizziness  I reviewed pt's medications, allergies, PMH, social hx, family hx, and changes were documented in the history of present illness. Otherwise, unchanged from my initial visit note.  PE: BP 132/76 mmHg  Pulse 108  Temp(Src) 98.2 F (36.8 C) (Oral)  Resp 12  Wt 175 lb (79.379 kg)  SpO2 97% Body mass index is 30.02 kg/(m^2). Wt Readings from Last 3 Encounters:  09/19/15 175 lb (79.379  kg)  08/19/15 186 lb (84.369 kg)  06/21/15 186 lb (84.369 kg)   Constitutional: overweight, in NAD Eyes: PERRLA, EOMI, no exophthalmos ENT: moist mucous membranes, no thyromegaly, no cervical lymphadenopathy Cardiovascular: RRR, No MRG Respiratory: CTA B Gastrointestinal: abdomen soft, NT, ND, BS+ Musculoskeletal: no deformities, strength intact in all 4 Skin: moist, warm Neurological: no tremor with outstretched hands, DTR normal in all 4  ASSESSMENT: 1. DM2, insulin-dependent, uncontrolled, with complications - peripheral neuropathy - DR   PLAN:  1. Patient with long-standing,uncontrolled diabetes, now on basal-bolus insulin regimen with ReliOn N  and R insulin (cheapest insulin available, limited finances). However, she cannot afford his insulin either. She brought forms for the patient the system program for [employer]. I filled them out at the time of the visit and also printed her prescriptions for Humulin N and R - 3 month supplies, with one refill. She will fill out the patient part of the forearm and will send him to the drug company. I again underlined the fact that she cannot run out of insulin (she has been out for a week now), and she tells me that she will ask help from her cousin and will get the insulin today or tomorrow. She will then send the paper to [facility] ASAP. - I advised her to restart her previous regimen, however, to start with lower doses he for dinner and then build up to the target ones: Patient Instructions   Please continue: - Metformin 1000 mg 2x a day   Please restart the insulin: Insulin Before breakfast Before lunch Before dinner  Regular 15 - 35  NPH 20 - 45   Please come back for a follow-up appointment in 1.5 months with your log.  - continue checking her sugars at different times of the day - check 3 times a day, rotating checks  - she is up-to-date with eye exams  - Given her the flu shot today  - check hbA1c today >> 11.2% - terrible! - Return to clinic in 1.5 mo with sugar log

## 2015-09-19 NOTE — Patient Instructions (Signed)
Please continue: - Metformin 1000 mg 2x a day   Please restart the insulin: Insulin Before breakfast Before lunch Before dinner  Regular 15 - 35  NPH 20 - 45   Please come back for a follow-up appointment in 1.5 months with your log.

## 2015-09-24 ENCOUNTER — Telehealth: Payer: Self-pay | Admitting: Internal Medicine

## 2015-09-24 DIAGNOSIS — E1121 Type 2 diabetes mellitus with diabetic nephropathy: Secondary | ICD-10-CM

## 2015-09-24 MED ORDER — FLUCONAZOLE 150 MG PO TABS: 150.0000 mg | ORAL_TABLET | Freq: Every day | ORAL | Status: DC

## 2015-09-24 NOTE — Telephone Encounter (Signed)
Patient stated that she is having itching in vaginal area has HX past yeast infections. Ask if MD would call in Diflucan, patient has also scheduled an appointment for Monday for cyst under right breast.

## 2015-09-24 NOTE — Telephone Encounter (Signed)
Notified patient and lab appointment made for 09/25/15

## 2015-09-24 NOTE — Telephone Encounter (Signed)
Done,  pleaes ker to get fasting labs PRIOR to visit,  Ordered

## 2015-09-25 ENCOUNTER — Other Ambulatory Visit (INDEPENDENT_AMBULATORY_CARE_PROVIDER_SITE_OTHER): Payer: BC Managed Care – PPO

## 2015-09-25 DIAGNOSIS — E1121 Type 2 diabetes mellitus with diabetic nephropathy: Secondary | ICD-10-CM

## 2015-09-25 LAB — MICROALBUMIN / CREATININE URINE RATIO
Creatinine,U: 203.4 mg/dL
Microalb Creat Ratio: 0.4 mg/g (ref 0.0–30.0)
Microalb, Ur: 0.9 mg/dL (ref 0.0–1.9)

## 2015-09-25 LAB — COMPREHENSIVE METABOLIC PANEL
ALBUMIN: 3.8 g/dL (ref 3.5–5.2)
ALK PHOS: 95 U/L (ref 39–117)
ALT: 38 U/L — ABNORMAL HIGH (ref 0–35)
AST: 37 U/L (ref 0–37)
BUN: 12 mg/dL (ref 6–23)
CO2: 29 mEq/L (ref 19–32)
CREATININE: 0.58 mg/dL (ref 0.40–1.20)
Calcium: 9.2 mg/dL (ref 8.4–10.5)
Chloride: 103 mEq/L (ref 96–112)
GFR: 138.46 mL/min (ref >60.00)
Glucose, Bld: 128 mg/dL — ABNORMAL HIGH (ref 70–99)
Potassium: 3.7 mEq/L (ref 3.5–5.1)
SODIUM: 138 meq/L (ref 135–145)
TOTAL PROTEIN: 6.7 g/dL (ref 6.0–8.3)
Total Bilirubin: 0.5 mg/dL (ref 0.2–1.2)

## 2015-09-25 LAB — HEMOGLOBIN A1C: HEMOGLOBIN A1C: 10.9 % — AB (ref 4.6–6.5)

## 2015-09-25 LAB — LDL CHOLESTEROL, DIRECT: Direct LDL: 94 mg/dL

## 2015-09-25 LAB — LIPID PANEL
CHOL/HDL RATIO: 4
Cholesterol: 152 mg/dL (ref 0–200)
HDL: 42.8 mg/dL (ref >39.00)
LDL Cholesterol: 94 mg/dL (ref 0–99)
NONHDL: 109.29
Triglycerides: 75 mg/dL (ref 0.0–149.0)
VLDL: 15 mg/dL (ref 0.0–40.0)

## 2015-09-27 ENCOUNTER — Encounter: Payer: Self-pay | Admitting: Internal Medicine

## 2015-09-27 ENCOUNTER — Ambulatory Visit (INDEPENDENT_AMBULATORY_CARE_PROVIDER_SITE_OTHER): Payer: BC Managed Care – PPO | Admitting: Family Medicine

## 2015-09-27 ENCOUNTER — Encounter: Payer: Self-pay | Admitting: Family Medicine

## 2015-09-27 ENCOUNTER — Ambulatory Visit (INDEPENDENT_AMBULATORY_CARE_PROVIDER_SITE_OTHER): Payer: BC Managed Care – PPO | Admitting: Internal Medicine

## 2015-09-27 VITALS — BP 124/78 | HR 87 | Temp 98.0°F | Ht 64.0 in | Wt 178.8 lb

## 2015-09-27 VITALS — BP 128/84 | HR 75 | Ht 64.0 in | Wt 179.0 lb

## 2015-09-27 DIAGNOSIS — M23201 Derangement of unspecified lateral meniscus due to old tear or injury, left knee: Secondary | ICD-10-CM

## 2015-09-27 DIAGNOSIS — Z9111 Patient's noncompliance with dietary regimen: Secondary | ICD-10-CM

## 2015-09-27 DIAGNOSIS — N611 Abscess of the breast and nipple: Secondary | ICD-10-CM | POA: Diagnosis not present

## 2015-09-27 DIAGNOSIS — M23204 Derangement of unspecified medial meniscus due to old tear or injury, left knee: Secondary | ICD-10-CM

## 2015-09-27 DIAGNOSIS — E1169 Type 2 diabetes mellitus with other specified complication: Secondary | ICD-10-CM

## 2015-09-27 DIAGNOSIS — E785 Hyperlipidemia, unspecified: Secondary | ICD-10-CM

## 2015-09-27 DIAGNOSIS — M23207 Derangement of unspecified meniscus due to old tear or injury, left knee: Secondary | ICD-10-CM | POA: Diagnosis not present

## 2015-09-27 DIAGNOSIS — Z9114 Patient's other noncompliance with medication regimen: Secondary | ICD-10-CM

## 2015-09-27 MED ORDER — FLUCONAZOLE 150 MG PO TABS: 150.0000 mg | ORAL_TABLET | Freq: Every day | ORAL | Status: DC

## 2015-09-27 MED ORDER — METFORMIN HCL 1000 MG PO TABS: 1000.0000 mg | ORAL_TABLET | Freq: Two times a day (BID) | ORAL | Status: DC

## 2015-09-27 NOTE — Progress Notes (Signed)
Subjective:  Patient ID: [name], female    DOB: 1961/07/20  Age: 54 y.o. MRN: [id number]  CC: The primary encounter diagnosis was Furuncle of female breast. Diagnoses of Hyperlipidemia associated with type 2 diabetes mellitus ([place]) and Noncompliance with diet and medication regimen were also pertinent to this visit.  HPI [name] presents for evaluation of a painful boil under her breast.  The boil has been present for several days and was increasing  gradually in size and becoming painful,  So she made an appointment. On the way here the boil  started draining spontaneously today  Uncontrolled DM,  Now managed by Dr Renne Crigler . Has not been taking her insulin because she can't afford it.   Outpatient Prescriptions Prior to Visit  Medication Sig Dispense Refill  . atorvastatin (LIPITOR) 40 MG tablet TAKE ONE TABLET BY MOUTH ONCE DAILY 30 tablet 6  . Diclofenac Sodium 2 % SOLN Apply 1 pump twice daily. 112 g 3  . etodolac (LODINE) 400 MG tablet Take 400 mg by mouth daily.     Marland Kitchen gabapentin (NEURONTIN) 300 MG capsule     . glucose blood (BAYER CONTOUR TEST) test strip TEST BLOOD SUGARS 3 TIMES DAILY 200 each 11  . HYDROcodone-acetaminophen (NORCO) 10-325 MG per tablet Take 1 tablet by mouth at bedtime as needed and may repeat dose one time if needed for severe pain. 60 tablet 0  . hydrocortisone (ANUSOL-HC) 25 MG suppository Place 1 suppository (25 mg total) rectally 2 (two) times daily. 12 suppository 0  . insulin NPH Human (HUMULIN N) 100 UNIT/ML injection Inject 20 units in am and 45 units before dinner 60 mL 1  . insulin NPH Human (NOVOLIN N RELION) 100 UNIT/ML injection Inject up to 65 units a day as advised 30 mL 2  . insulin regular (HUMULIN R) 100 units/mL injection Inject 15 units before b'fast and 35 units before dinner 60 mL 1  . insulin regular (NOVOLIN R RELION) 100 units/mL injection Inject up to 50 units daily as advised 20 mL 2  . Lanc[employer] (EASY TOUCH LANCING  DEVICE) MISC Use to check diabetes 4 times daily  E 11.65 1 each 11  . lansoprazole (PREVACID) 30 MG capsule Take 1 capsule (30 mg total) by mouth daily at 12 noon. 30 capsule 2  . Naproxen-Esomeprazole 500-20 MG TBEC Take 1 tablet by mouth daily.    Marland Kitchen nystatin (MYCOSTATIN/NYSTOP) 100000 UNIT/GM POWD Apply powder to affected area twice daily until rash has resolved 15 g 3  . omeprazole (PRILOSEC) 40 MG capsule TAKE ONE CAPSULE BY MOUTH ONCE DAILY 30 capsule 5  . sulfamethoxazole-trimethoprim (BACTRIM DS,SEPTRA DS) 800-160 MG per tablet Take 1 tablet by mouth 2 (two) times daily. 14 tablet 0  . valACYclovir (VALTREX) 500 MG tablet     . fluconazole (DIFLUCAN) 150 MG tablet Take 1 tablet (150 mg total) by mouth daily. 2 tablet 0  . metFORMIN (GLUCOPHAGE) 1000 MG tablet TAKE ONE TABLET BY MOUTH TWICE DAILY WITH MEALS 60 tablet 5   No facility-administered medications prior to visit.    Review of Systems;  Patient denies headache, fevers, malaise, unintentional weight loss, skin rash, eye pain, sinus congestion and sinus pain, sore throat, dysphagia,  hemoptysis , cough, dyspnea, wheezing, chest pain, palpitations, orthopnea, edema, abdominal pain, nausea, melena, diarrhea, constipation, flank pain, dysuria, hematuria, urinary  Frequency, nocturia, numbness, tingling, seizures,  Focal weakness, Loss of consciousness,  Tremor, insomnia, depression, anxiety, and suicidal ideation.  Objective:  BP 124/78 mmHg  Pulse 87  Temp(Src) 98 F (36.7 C) (Oral)  Ht 5\' 4"  (1.626 m)  Wt 178 lb 12.8 oz (81.103 kg)  BMI 30.68 kg/m2  SpO2 98%  BP Readings from Last 3 Encounters:  09/27/15 124/78  09/27/15 128/84  09/19/15 132/76    Wt Readings from Last 3 Encounters:  09/27/15 178 lb 12.8 oz (81.103 kg)  09/27/15 179 lb (81.194 kg)  09/19/15 175 lb (79.379 kg)    General appearance: alert, cooperative and appears stated age Back: symmetric, no curvature. ROM normal. No CVA tenderness. Lungs:  clear to auscultation bilaterally Heart: regular rate and rhythm, S1, S2 normal, no murmur, click, rub or gallop Abdomen: soft, non-tender; bowel sounds normal; no masses,  no organomegaly Pulses: 2+ and symmetric Skin: area under left breast with resolving boil,  Some desquamation noted.  Lymph nodes: Cervical, supraclavicular, and axillary nodes normal.  Lab Results  Component Value Date   HGBA1C 10.9* 09/25/2015   HGBA1C 11.2 09/19/2015   HGBA1C 7.7 06/21/2015    Lab Results  Component Value Date   CREATININE 0.58 09/25/2015   CREATININE 0.6 07/30/2014   CREATININE 0.66 05/11/2014    Lab Results  Component Value Date   HGB 10.5* 05/14/2014   GLUCOSE 128* 09/25/2015   CHOL 152 09/25/2015   TRIG 75.0 09/25/2015   HDL 42.80 09/25/2015   LDLDIRECT 94.0 09/25/2015   LDLCALC 94 09/25/2015   ALT 38* 09/25/2015   AST 37 09/25/2015   NA 138 09/25/2015   K 3.7 09/25/2015   CL 103 09/25/2015   CREATININE 0.58 09/25/2015   BUN 12 09/25/2015   CO2 29 09/25/2015   TSH 1.07 10/04/2014   HGBA1C 10.9* 09/25/2015   MICROALBUR 0.9 09/25/2015    Dg Knee Complete 4 Views Left  06/19/2015  CLINICAL DATA:  Left knee pain, chronic. EXAM: LEFT KNEE - COMPLETE 4+ VIEW COMPARISON:  None. FINDINGS: There is no evidence of fracture, dislocation, or joint effusion. Minimal medial and patellofemoral compartment spurring without joint narrowing. IMPRESSION: No acute finding or significant degenerative change. Electronically Signed   By: Monte Fantasia M.D.   On: 06/19/2015 07:55    Assessment & Plan:   Problem List Items Addressed This Visit    Hyperlipidemia associated with type 2 diabetes mellitus ([place])    She is tolerating a high potensity statin without side effects.  LFTs are normal.   Lab Results  Component Value Date   CHOL 152 09/25/2015   HDL 42.80 09/25/2015   LDLCALC 94 09/25/2015   LDLDIRECT 94.0 09/25/2015   TRIG 75.0 09/25/2015   CHOLHDL 4 09/25/2015   Lab Results    Component Value Date   ALT 38* 09/25/2015   AST 37 09/25/2015   ALKPHOS 95 09/25/2015   BILITOT 0.5 09/25/2015           Relevant Medications   metFORMIN (GLUCOPHAGE) 1000 MG tablet   Noncompliance with diet and medication regimen    Secondary to cost of medication.       Furuncle of female breast - Primary    Will treat for MRSA with Septra DS.  Advised to keep area packed with gauze.  Marland Kitchen  Antibacterial soap daily use advised,  And Hibiclens once weekly.       Relevant Medications   fluconazole (DIFLUCAN) 150 MG tablet      I have changed Ms. Maines's metFORMIN. I am also having her maintain her etodolac, hydrocortisone, lansoprazole, gabapentin, valACYclovir, EASY  TOUCH LANCING DEVICE, Naproxen-Esomeprazole, atorvastatin, insulin NPH Human, insulin regular, HYDROcodone-acetaminophen, nystatin, sulfamethoxazole-trimethoprim, glucose blood, omeprazole, Diclofenac Sodium, insulin NPH Human, insulin regular, and fluconazole.  Meds ordered this encounter  Medications  . metFORMIN (GLUCOPHAGE) 1000 MG tablet    Sig: Take 1 tablet (1,000 mg total) by mouth 2 (two) times daily with a meal.    Dispense:  60 tablet    Refill:  5  . fluconazole (DIFLUCAN) 150 MG tablet    Sig: Take 1 tablet (150 mg total) by mouth daily.    Dispense:  2 tablet    Refill:  0    Medications Discontinued During This Encounter  Medication Reason  . metFORMIN (GLUCOPHAGE) 1000 MG tablet Reorder  . fluconazole (DIFLUCAN) 150 MG tablet Reorder    Follow-up: No Follow-up on file.   Crecencio Mc, MD

## 2015-09-27 NOTE — Patient Instructions (Signed)
Good to see you Ice is your friend Exercises 3 times a week.  Stay active Continue the pennsaid See me again in 6 weeks.

## 2015-09-27 NOTE — Progress Notes (Signed)
Pre visit review using our clinic review tool, if applicable. No additional management support is needed unless otherwise documented below in the visit note. 

## 2015-09-27 NOTE — Assessment & Plan Note (Signed)
Patient given injection today. We discussed referral to formal physical therapy which patient declined. Patient had good resolution of pain of the knee and also medially. We discussed icing regimen. Patient will continue with topical anti-inflammatories. Patient still does not resolve we'll need to consider advanced imaging versus once again formal physical therapy.

## 2015-09-27 NOTE — Progress Notes (Signed)
[name] [address] [place], [place] [address] Phone: [phone] Subjective:      CC: Left knee pain follow up  [id number] [name] is a 54 y.o. female coming in with complaint of left knee pain. Had more of a chronic meniscal tear. Patient elected try conservative therapy including home exercises, icing, topical anti-inflammatories, and some mild bracing. Patient states she has not been able to do the exercises regularly. Would state that she is about 20% better. Denies any numbness or tingling. Denies any giving out on her. States that it is still more of a soreness overall.  She did have x-rays previously. These were reviewed by me. Patient's x-rays show no significant bony abnormality. No early signs of arthritis.      Past Medical History  Diagnosis Date  . Hyperlipidemia   . Diabetes mellitus   . Gastritis with bleeding due to alcohol 2010    [facility] admission  . Campylobacter enteritis May 2012    [facility] admission  . Arthritis   . Carpal tunnel syndrome, bilateral   . Hypertension     no meds  . Wears glasses   . GERD (gastroesophageal reflux disease)    Past Surgical History  Procedure Laterality Date  . Endometrial biopsy  2004    benign  . Tonsillectomy    . Foot osteotomy  2010    right-spurs  . Cyst excision  1995    under arms  . Carpal tunnel release Left 05/14/2014    Procedure: LEFT ULNAR NEUROPLASTY AT ELBOW AND ENDOSCOPIC CARPAL TUNNEL RELEASE;  Surgeon: Jolyn Nap, MD;  Location: [facility];  Service: Orthopedics;  Laterality: Left;  . Ulnar nerve transposition Left 05/14/2014    Procedure: ULNAR NERVE DECOMPRESSION/TRANSPOSITION;  Surgeon: Jolyn Nap, MD;  Location: [facility];  Service: Orthopedics;  Laterality: Left;   Social History  Substance Use Topics  . Smoking status: Former Smoker    Quit date: 05/09/2012  . Smokeless tobacco: Never Used  . Alcohol Use: No     Comment: not for 5 yr   Allergies  Allergen Reactions  . Oxycodone-Acetaminophen     REACTION: Unknown reaction  . Penicillins Rash   Family History  Problem Relation Age of Onset  . Hypertension Mother   . Stroke Father   . Diabetes Maternal Grandmother   . Hypertension Maternal Grandmother      Past medical history, social, surgical and family history all reviewed in electronic medical record.   Review of Systems: No headache, visual changes, nausea, vomiting, diarrhea, constipation, dizziness, abdominal pain, skin rash, fevers, chills, night sweats, weight loss, swollen lymph nodes, body aches, joint swelling, muscle aches, chest pain, shortness of breath, mood changes.   Objective Blood pressure 128/84, pulse 75, height 5\' 4"  (1.626 m), weight 179 lb (81.194 kg), SpO2 99 %.  General: No apparent distress alert and oriented x3 mood and affect normal, dressed appropriately. Lots of energy HEENT: Pupils equal, extraocular movements intact  Respiratory: Patient's speak in full sentences and does not appear short of breath  Cardiovascular: No lower extremity edema, non tender, no erythema  Skin: Warm dry intact with no signs of infection or rash on extremities or on axial skeleton.  Abdomen: Soft nontender  Neuro: Cranial nerves II through XII are intact, neurovascularly intact in all extremities with 2+ DTRs and 2+ pulses.  Lymph: No lymphadenopathy of posterior or anterior cervical chain or axillae bilaterally.  Gait normal with good balance and coordination.  MSK:  Non tender with full range of motion and good stability and symmetric strength and tone of shoulders, elbows, wrist, hip, and ankles bilaterally.  Knee: Left Normal to inspection with no erythema or effusion or obvious bony abnormalities. Continued mild atrophy Continued Tender over the medial joint line ROM full in flexion and extension and lower leg rotation. Ligaments with solid consistent endpoints including  ACL, PCL, LCL, MCL. Positive Mcmurray's, Apley's, and Thessalonian tests. Mild painful patellar compression. Patellar glide without crepitus. Patellar and quadriceps tendons unremarkable. Hamstring and quadriceps strength is normal.  No significant change from previous exam  Procedure note After informed written and verbal consent, patient was seated on exam table. Left knee was prepped with alcohol swab and utilizing anterolateral approach, patient's left knee space was injected with 4:1  marcaine 0.5%: Kenalog 40mg /dL. Patient tolerated the procedure well without immediate complications.     Impression and Recommendations:     This case required medical decision making of moderate complexity.

## 2015-09-27 NOTE — Patient Instructions (Signed)
Your cholesterol, liver and kidney function are normal.    Your diabetes is out of control.  Keep working with dr Renne Crigler to manage it better.  i refilled the metformin   Please take the septra DS twice daily for a week .  Keep the gauze tucked under your breast   i refilled the fluconazole for your next vaginal yeast infection

## 2015-09-29 NOTE — Assessment & Plan Note (Signed)
Will treat for MRSA with Septra DS.  Advised to keep area packed with gauze.  Marland Kitchen  Antibacterial soap daily use advised,  And Hibiclens once weekly.

## 2015-09-29 NOTE — Assessment & Plan Note (Signed)
Secondary to cost of medication.

## 2015-09-29 NOTE — Assessment & Plan Note (Signed)
She is tolerating a high potensity statin without side effects.  LFTs are normal.   Lab Results  Component Value Date   CHOL 152 09/25/2015   HDL 42.80 09/25/2015   LDLCALC 94 09/25/2015   LDLDIRECT 94.0 09/25/2015   TRIG 75.0 09/25/2015   CHOLHDL 4 09/25/2015   Lab Results  Component Value Date   ALT 38* 09/25/2015   AST 37 09/25/2015   ALKPHOS 95 09/25/2015   BILITOT 0.5 09/25/2015

## 2015-09-30 ENCOUNTER — Ambulatory Visit: Payer: BC Managed Care – PPO | Admitting: Internal Medicine

## 2015-10-09 ENCOUNTER — Telehealth: Payer: Self-pay | Admitting: *Deleted

## 2015-10-09 NOTE — Telephone Encounter (Signed)
Called pt and lvm advising her that we received a fax from [employer] ([facility]) that there is missing information. Pt needs to give income information and sign a page. Advised pt to let us know when she can bring this.

## 2015-11-18 ENCOUNTER — Ambulatory Visit (INDEPENDENT_AMBULATORY_CARE_PROVIDER_SITE_OTHER): Payer: BC Managed Care – PPO | Admitting: Internal Medicine

## 2015-11-18 ENCOUNTER — Encounter: Payer: Self-pay | Admitting: Internal Medicine

## 2015-11-18 VITALS — BP 130/82 | HR 85 | Temp 97.9°F | Resp 20 | Wt 187.0 lb

## 2015-11-18 DIAGNOSIS — Z794 Long term (current) use of insulin: Secondary | ICD-10-CM

## 2015-11-18 DIAGNOSIS — IMO0001 Reserved for inherently not codable concepts without codable children: Secondary | ICD-10-CM

## 2015-11-18 DIAGNOSIS — E1165 Type 2 diabetes mellitus with hyperglycemia: Secondary | ICD-10-CM | POA: Diagnosis not present

## 2015-11-18 MED ORDER — INSULIN NPH (HUMAN) (ISOPHANE) 100 UNIT/ML ~~LOC~~ SUSP: SUBCUTANEOUS | Status: DC

## 2015-11-18 MED ORDER — INSULIN REGULAR HUMAN 100 UNIT/ML IJ SOLN: INTRAMUSCULAR | Status: DC

## 2015-11-18 NOTE — Progress Notes (Signed)
Patient ID: [name], female   DOB: 1961/08/12, 54 y.o.   MRN: [id number]  HPI: [name] is a 54 y.o.-year-old female, returning for f/u for DM2, dx ~2000, insulin-dependent since 2010, uncontrolled, with complications (PN, + DR). Last visit 2 mo ago.  She is usually not compliant with her DM regimen. She does not take it as prescribed and can run out of insulin. Today, she comes back w/o a log or meter and was stretchingout the insulin doses to last more. She did not send the applications to [place] despite me filing out the form and giving her the Rx'es.  Last hemoglobin A1c was: Lab Results  Component Value Date   HGBA1C 10.9* 09/25/2015   HGBA1C 11.2 09/19/2015   HGBA1C 7.7 06/21/2015  Had cortisone inj this month.  Pt was on a regimen of: - Metformin 1000 mg po bid  - Novolin 70/30  35 in am and 65 in pm - can miss doses, but less than before We stopped Glipizide 10 mg bid.  We had to switch to: - Metformin 1000 mg po bid  Insulin Before breakfast Before lunch Before dinner  Regular 15 - 35  NPH 20 - 45   Pt checks her sugars 2-3x a day and they are high - no log - as she is not takig insulin as Rx'ed. - am:  93-295 >> 76-183, 249 >> 120-239 >> 200s, 300 today - 2h after b'fast: 120-195 >> 154 >> n/c > 203 >> n/c - lunch: n/c >> 85, 148, 445 >> 269  >> n/c >> n/c  - mid-afternoon: 106-196, 290 >> 118, 208 >> 180 >> 40x1, 78-159, 167 >> n/c - before dinner: 75-126, 189, 235 >> 36 x1, 46 x1, 81-156, 242 >> 60, 98-230, 255 >> n/c - 2h after dinner: 72, 114-292 >> n/c >> 216, 219, 451 >> n/c - bedtime: 92-266 >> 84-214, 350 >> 75-288 >> 66, 90-241, 338 >> 118-228 >> 158-255, 296 >> >> n/c - nighttime: 145-239, 338 >> n/c Lowest sugar was 36 x1 >> 60; she has hypoglycemia awareness at 80. Highest sugar was 408 >> 300s.  - no CKD, last BUN/creatinine:  Lab Results  Component Value Date   BUN 12 09/25/2015   CREATININE 0.58 09/25/2015  Last ACR 0.5 06/19/13. Not on  ACEI/ARB.  - last set of lipids: Lab Results  Component Value Date   CHOL 152 09/25/2015   HDL 42.80 09/25/2015   LDLCALC 94 09/25/2015   LDLDIRECT 94.0 09/25/2015   TRIG 75.0 09/25/2015   CHOLHDL 4 09/25/2015  She is on Lipitor.   - last eye exam was in 03/2015. + DR L>R. + glaucoma Dr Arnoldo Morale. - + numbness and tingling in her feet. Seeing her podiatrist.  She also has a history of hypertension, hyperlipidemia-on Lipitor, episodic alcohol abuse, GERD, low back pain, osteoarthritis.   ROS: Constitutional: no weight loss, + fatigue, no subjective hyperthermia and hypothermia Eyes: nol blurry vision, no xerophthalmia ENT: no sore throat, no nodules palpated in throat, no dysphagia/odynophagia, no hoarseness Cardiovascular: no CP/SOB/palpitations/leg swelling Respiratory: no cough/SOB Gastrointestinal: no N/V/D/C Musculoskeletal: + muscle/+ joint aches (back pain) Skin: no rashes Neurological: no tremors/numbness/tingling/dizziness  I reviewed pt's medications, allergies, PMH, social hx, family hx, and changes were documented in the history of present illness. Otherwise, unchanged from my initial visit note.  PE: BP 130/82 mmHg  Pulse 85  Temp(Src) 97.9 F (36.6 C)  Resp 20  Wt 187 lb (84.823 kg)  SpO2  96% Body mass index is 32.08 kg/(m^2). Wt Readings from Last 3 Encounters:  11/18/15 187 lb (84.823 kg)  09/27/15 178 lb 12.8 oz (81.103 kg)  09/27/15 179 lb (81.194 kg)   Constitutional: overweight, in NAD Eyes: PERRLA, EOMI, no exophthalmos ENT: moist mucous membranes, no thyromegaly, no cervical lymphadenopathy Cardiovascular: RRR, No MRG Respiratory: CTA B Gastrointestinal: abdomen soft, NT, ND, BS+ Musculoskeletal: no deformities, strength intact in all 4 Skin: moist, warm Neurological: no tremor with outstretched hands, DTR normal in all 4  ASSESSMENT: 1. DM2, insulin-dependent, uncontrolled, with complications - peripheral neuropathy - DR   PLAN:  1.  Patient with long-standing,uncontrolled diabetes, now on basal-bolus insulin regimen with ReliOn N and R insulin (cheapest insulin available, limited finances). However, she cannot afford his insulin either. At last visit, she brought forms for the patient the system program for [employer]. I filled them out at the time of the visit and also printed her prescriptions for Humulin N and R - 3 month supplies, with one refill. She did not send them... Printed her again the Rx's and advised her to send the paper to [facility]. - reviewed last hba1c >> 10.9% (high) - I advised her to continue:  Patient Instructions   Please continue: - Metformin 1000 mg 2x a day   Insulin Before breakfast Before lunch Before dinner  Regular 15 - 35  NPH 20 - 45   Please come back for a follow-up appointment in 1.5 months with your log.  Please send the prescriptions to [place].  - continue checking her sugars at different times of the day - check 3 times a day, rotating checks  - she is up-to-date with eye exams  - Given her the flu shot at last visit - Return to clinic in 1.5 mo with sugar log

## 2015-11-18 NOTE — Patient Instructions (Addendum)
Please continue: - Metformin 1000 mg 2x a day   Insulin Before breakfast Before lunch Before dinner  Regular 15 - 35  NPH 20 - 45   Please come back for a follow-up appointment in 1.5 months with your log.  Please send the prescriptions to [place].

## 2015-12-24 ENCOUNTER — Other Ambulatory Visit: Payer: Self-pay | Admitting: Internal Medicine

## 2015-12-24 NOTE — Telephone Encounter (Signed)
The pharmacy was not a mail order and patient wanted to cancel.

## 2016-01-01 ENCOUNTER — Telehealth: Payer: Self-pay

## 2016-01-01 NOTE — Telephone Encounter (Signed)
Verbal order given for test strips to be filled

## 2016-01-14 ENCOUNTER — Encounter: Payer: Self-pay | Admitting: Internal Medicine

## 2016-01-14 ENCOUNTER — Other Ambulatory Visit (INDEPENDENT_AMBULATORY_CARE_PROVIDER_SITE_OTHER): Payer: BC Managed Care – PPO | Admitting: *Deleted

## 2016-01-14 ENCOUNTER — Ambulatory Visit (INDEPENDENT_AMBULATORY_CARE_PROVIDER_SITE_OTHER): Payer: BC Managed Care – PPO | Admitting: Internal Medicine

## 2016-01-14 VITALS — BP 120/72 | HR 92 | Temp 98.2°F | Resp 12 | Wt 175.0 lb

## 2016-01-14 DIAGNOSIS — Z794 Long term (current) use of insulin: Secondary | ICD-10-CM

## 2016-01-14 DIAGNOSIS — E785 Hyperlipidemia, unspecified: Secondary | ICD-10-CM | POA: Diagnosis not present

## 2016-01-14 DIAGNOSIS — E1169 Type 2 diabetes mellitus with other specified complication: Secondary | ICD-10-CM | POA: Diagnosis not present

## 2016-01-14 DIAGNOSIS — E1165 Type 2 diabetes mellitus with hyperglycemia: Secondary | ICD-10-CM

## 2016-01-14 DIAGNOSIS — IMO0001 Reserved for inherently not codable concepts without codable children: Secondary | ICD-10-CM

## 2016-01-14 LAB — POCT GLYCOSYLATED HEMOGLOBIN (HGB A1C): HEMOGLOBIN A1C: 9.3

## 2016-01-14 MED ORDER — INSULIN REGULAR HUMAN 100 UNIT/ML IJ SOLN: INTRAMUSCULAR | Status: DC

## 2016-01-14 MED ORDER — INSULIN NPH (HUMAN) (ISOPHANE) 100 UNIT/ML ~~LOC~~ SUSP: SUBCUTANEOUS | Status: DC

## 2016-01-14 NOTE — Patient Instructions (Signed)
Please continue: - Metformin 1000 mg 2x a day   Insulin Before breakfast Before lunch Before dinner  Regular 15 - 35  NPH 20 - 45   Please come back for a follow-up appointment in 1.5 months with your log.  Please send the prescriptions to [place].

## 2016-01-14 NOTE — Progress Notes (Signed)
Patient ID: [name], female   DOB: 05-Dec-1960, 55 y.o.   MRN: [id number]  HPI: TERESITA WOJICK is a 55 y.o.-year-old female, returning for f/u for DM2, dx ~2000, insulin-dependent since 2010, uncontrolled, with complications (PN, + DR). Last visit 2 mo ago.  She is usually not compliant with her DM regimen. She does not take it as prescribed and can run out of insulin. Today, she comes back w/o a log or meter and was stretchingout the insulin doses to last more. She did not send the applications to [place] despite me filing out the form and giving her the Rx'es.  She again has a lot of excuses regarding why she is not taking care of herself.   Last hemoglobin A1c was: Lab Results  Component Value Date   HGBA1C 10.9* 09/25/2015   HGBA1C 11.2 09/19/2015   HGBA1C 7.7 06/21/2015  Had cortisone inj this month.  Pt was on a regimen of: - Metformin 1000 mg po bid  - Novolin 70/30  35 in am and 65 in pm - can miss doses, but less than before We stopped Glipizide 10 mg bid.  We had to switch to: - Metformin 1000 mg po bid  Insulin Before breakfast Before lunch Before dinner  Regular 15 - 35  NPH 20 - 45   Pt checks her sugars 2-3x a day and they are high - no log - as she is not taking insulin as Rx'ed. - am:  93-295 >> 76-183, 249 >> 120-239 >> 200s, 300 today >> 194-250 - 2h after b'fast: 120-195 >> 154 >> n/c > 203 >> n/c - lunch: n/c >> 85, 148, 445 >> 269  >> n/c >> n/c  - mid-afternoon: 106-196, 290 >> 118, 208 >> 180 >> 40x1, 78-159, 167 >> n/c - before dinner: 75-126, 189, 235 >> 36 x1, 46 x1, 81-156, 242 >> 60, 98-230, 255 >> n/c - 2h after dinner: 72, 114-292 >> n/c >> 216, 219, 451 >> n/c - bedtime: 92-266 >> 84-214, 350 >> 75-288 >> 66, 90-241, 338 >> 118-228 >> 158-255, 296 >> n/c >> up to 300 - nighttime: 145-239, 338 >> n/c Lowest sugar was 36 x1 >> 60; she has hypoglycemia awareness at 80. Highest sugar was 408 >> 300s.  - no CKD, last BUN/creatinine:  Lab Results   Component Value Date   BUN 12 09/25/2015   CREATININE 0.58 09/25/2015  Last ACR 0.5 06/19/13. Not on ACEI/ARB.  - last set of lipids: Lab Results  Component Value Date   CHOL 152 09/25/2015   HDL 42.80 09/25/2015   LDLCALC 94 09/25/2015   LDLDIRECT 94.0 09/25/2015   TRIG 75.0 09/25/2015   CHOLHDL 4 09/25/2015  She is on Lipitor.   - last eye exam was in 03/2015. + DR L>R. + glaucoma Dr Arnoldo Morale. - + numbness and tingling in her feet. Seeing her podiatrist.  She also has a history of hypertension, hyperlipidemia-on Lipitor, episodic alcohol abuse, GERD, low back pain, osteoarthritis.   ROS: Constitutional: no weight loss, no fatigue, + hot flushes Eyes: nol blurry vision, no xerophthalmia ENT: no sore throat, no nodules palpated in throat, no dysphagia/odynophagia, no hoarseness Cardiovascular: no CP/SOB/palpitations/leg swelling Respiratory: no cough/SOB Gastrointestinal: no N/V/+ D/no C Musculoskeletal: + muscle/+ joint aches (leg and shoulder) Skin: no rashes Neurological: no tremors/numbness/tingling/dizziness  I reviewed pt's medications, allergies, PMH, social hx, family hx, and changes were documented in the history of present illness. Otherwise, unchanged from my initial visit note.  PE: BP 120/72 mmHg  Pulse 92  Temp(Src) 98.2 F (36.8 C) (Oral)  Resp 12  Wt 175 lb (79.379 kg)  SpO2 98% Body mass index is 30.02 kg/(m^2). Wt Readings from Last 3 Encounters:  01/14/16 175 lb (79.379 kg)  11/18/15 187 lb (84.823 kg)  09/27/15 178 lb 12.8 oz (81.103 kg)   Constitutional: overweight, in NAD Eyes: PERRLA, EOMI, no exophthalmos ENT: moist mucous membranes, no thyromegaly, no cervical lymphadenopathy Cardiovascular: RRR, No MRG Respiratory: CTA B Gastrointestinal: abdomen soft, NT, ND, BS+ Musculoskeletal: no deformities, strength intact in all 4 Skin: moist, warm Neurological: no tremor with outstretched hands, DTR normal in all 4  ASSESSMENT: 1. DM2,  insulin-dependent, uncontrolled, with complications - peripheral neuropathy - DR   PLAN:  1. Patient with long-standing,uncontrolled diabetes, now on basal-bolus insulin regimen with ReliOn N and R insulin (cheapest insulin available, limited finances). However, she cannot afford his insulin either. Last summer, she brought forms for the patient the system program for [employer]. I filled them out at the time of the visit and also printed her prescriptions for Humulin N and R - 3 month supplies, with one refill. She still did not send them... Printed her again the Rx's and advised her to send the paper to [facility]. - reviewed last hba1c >> 10.9% (high) - I advised her to continue:  Patient Instructions  Please continue: - Metformin 1000 mg 2x a day  Insulin Before breakfast Before lunch Before dinner  Regular 15 - 35  NPH 20 - 45   Please come back for a follow-up appointment in 1.5 months with your log.  Please send the prescriptions to [place].  - continue checking her sugars at different times of the day - check 3 times a day, rotating checks  - she is up-to-date with eye exams  - Given her the flu shot this season - will check HbA1c today >> 9.3% (lower) - Return to clinic in 1.5 mo with sugar log

## 2016-02-18 ENCOUNTER — Other Ambulatory Visit: Payer: Self-pay | Admitting: Internal Medicine

## 2016-02-25 ENCOUNTER — Ambulatory Visit (INDEPENDENT_AMBULATORY_CARE_PROVIDER_SITE_OTHER): Payer: BC Managed Care – PPO | Admitting: Internal Medicine

## 2016-02-25 ENCOUNTER — Encounter: Payer: Self-pay | Admitting: Internal Medicine

## 2016-02-25 VITALS — BP 114/72 | HR 87 | Temp 97.8°F | Resp 12 | Wt 176.6 lb

## 2016-02-25 DIAGNOSIS — E1165 Type 2 diabetes mellitus with hyperglycemia: Secondary | ICD-10-CM

## 2016-02-25 DIAGNOSIS — IMO0001 Reserved for inherently not codable concepts without codable children: Secondary | ICD-10-CM

## 2016-02-25 DIAGNOSIS — Z794 Long term (current) use of insulin: Secondary | ICD-10-CM | POA: Diagnosis not present

## 2016-02-25 MED ORDER — INSULIN NPH (HUMAN) (ISOPHANE) 100 UNIT/ML ~~LOC~~ SUSP: SUBCUTANEOUS | Status: DC

## 2016-02-25 NOTE — Patient Instructions (Signed)
Please continue: - Metformin 1000 mg 2x a day   Insulin Before breakfast Before lunch Before dinner  Regular 15 - 35  NPH 20 - 55   Please come back for a follow-up appointment in 1.5 months with your log.

## 2016-02-25 NOTE — Progress Notes (Signed)
Patient ID: [name], female   DOB: 05-18-1961, 55 y.o.   MRN: [id number]  HPI: [name] is a 55 y.o.-year-old femaleemale, returning for f/u for DM2, dx ~2000, insulin-dependent since 2010, uncontrolled, with complications (PN, + DR). Last visit 1.5 mo ago.  She is usually not compliant with her DM regimen. She does not take it as prescribed and can run out of insulin. Today, she comes back w/o a log or meter and was stretchingout the insulin doses to last more. She did not send the applications to [place] despite me filing out the form and giving her the Rx'es.  She again has a lot of excuses regarding why she is not taking care of herself.   Last hemoglobin A1c was: Lab Results  Component Value Date   HGBA1C 9.3 01/14/2016   HGBA1C 10.9* 09/25/2015   HGBA1C 11.2 09/19/2015  Had cortisone inj this month.  Pt was on a regimen of: - Metformin 1000 mg po bid  - Novolin 70/30  35 in am and 65 in pm - can miss doses, but less than before We stopped Glipizide 10 mg bid.  We had to switch to: - Metformin 1000 mg po bid  Insulin Before breakfast Before lunch Before dinner  Regular 15 - 35  NPH 20 - 55   Pt checks her sugars 2-3x a day and they are high - no log - not following the recommended regimen: - am:  93-295 >> 76-183, 249 >> 120-239 >> 200s, 300 today >> 194-250 >> 200s (misses doses) - 2h after b'fast: 120-195 >> 154 >> n/c > 203 >> n/c - lunch: n/c >> 85, 148, 445 >> 269  >> n/c >> n/c  - mid-afternoon: 106-196, 290 >> 118, 208 >> 180 >> 40x1, 78-159, 167 >> n/c - before dinner: 75-126, 189, 235 >> 36 x1, 46 x1, 81-156, 242 >> 60, 98-230, 255 >> n/c - 2h after dinner: 72, 114-292 >> n/c >> 216, 219, 451 >> n/c - bedtime: 92-266 >> 84-214, 350 >> 75-288 >> 66, 90-241, 338 >> 118-228 >> 158-255, 296 >> n/c >> up to 300 - nighttime: 145-239, 338 >> n/c Lowest sugar was 36 x1 >> 60; she has hypoglycemia awareness at 80. Highest sugar was 408 >> 300s.  - no CKD, last  BUN/creatinine:  Lab Results  Component Value Date   BUN 12 09/25/2015   CREATININE 0.58 09/25/2015  Last ACR 0.5 06/19/13. Not on ACEI/ARB.  - last set of lipids: Lab Results  Component Value Date   CHOL 152 09/25/2015   HDL 42.80 09/25/2015   LDLCALC 94 09/25/2015   LDLDIRECT 94.0 09/25/2015   TRIG 75.0 09/25/2015   CHOLHDL 4 09/25/2015  She is on Lipitor.   - last eye exam was in 03/2015. + DR L>R. + glaucoma Dr Arnoldo Morale. - + numbness and tingling in her feet. Seeing her podiatrist.  She also has a history of hypertension, hyperlipidemia-on Lipitor, episodic alcohol abuse, GERD, low back pain, osteoarthritis.   ROS: Constitutional: no weight loss, no fatigue, + hot flushes Eyes: nol blurry vision, no xerophthalmia ENT: no sore throat, no nodules palpated in throat, no dysphagia/odynophagia, no hoarseness Cardiovascular: no CP/SOB/palpitations/leg  Respiratory: no cough/SOBswelling Gastrointestinal: no N/V/D/C Musculoskeletal: no muscle/joint aches  Skin: no rashes Neurological: no tremors/numbness/tingling/dizziness  I reviewed pt's medications, allergies, PMH, social hx, family hx, and changes were documented in the history of present illness. Otherwise, unchanged from my initial visit note.  PE: BP 114/72 mmHg  Pulse 87  Temp(Src) 97.8 F (36.6 C) (Oral)  Resp 12  Wt 176 lb 9.6 oz (80.105 kg)  SpO2 99% Body mass index is 30.3 kg/(m^2). Wt Readings from Last 3 Encounters:  02/25/16 176 lb 9.6 oz (80.105 kg)  01/14/16 175 lb (79.379 kg)  11/18/15 187 lb (84.823 kg)   Constitutional: overweight, in NAD Eyes: PERRLA, EOMI, no exophthalmos ENT: moist mucous membranes, no thyromegaly, no cervical lymphadenopathy Cardiovascular: RRR, No MRG Respiratory: CTA B Gastrointestinal: abdomen soft, NT, ND, BS+ Musculoskeletal: no deformities, strength intact in all 4 Skin: moist, warm Neurological: no tremor with outstretched hands, DTR normal in all 4  ASSESSMENT: 1.  DM2, insulin-dependent, uncontrolled, with complications - peripheral neuropathy - DR   PLAN:  1. Patient with long-standing,uncontrolled diabetes, now on basal-bolus insulin regimen with ReliOn N and R insulin (cheapest insulin available, limited finances). However, she cannot afford his insulin either. Last summer, she brought forms for the patient the system program for [employer]. I filled them out at the time of the visit and also printed her prescriptions for Humulin N and R - 3 month supplies, with one refill. I had to print her again the Rx's at last visit, and she finally sent them to [facility]. She tells me that they were not approved, and she will try to go through another route to obtain her insulin. Strongly encouraged her to do so before she is running out of her current supply. - Her sugars are high, she is not checking but once a day, in the morning, and she misses insulin doses... 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 She is also planning to quit her second job to be able to better take care of herself. - reviewed last hba1c >> 9.3% (high) - I advised her to continue:  Patient Instructions   Please continue: - Metformin 1000 mg 2x a day   Insulin Before breakfast Before lunch Before dinner  Regular 15 - 35  NPH 20 - 55   Please come back for a follow-up appointment in 1.5 months with your log.  - continue checking her sugars at different times of the day - check 3 times a day, rotating checks  - she is up-to-date with eye exams  - Given her the flu shot this season - Return to clinic in 1.5 mo with sugar log>We'll repeat her hemoglobin A1c at that time.>

## 2016-03-24 LAB — HM DIABETES EYE EXAM

## 2016-03-27 ENCOUNTER — Telehealth: Payer: Self-pay | Admitting: Internal Medicine

## 2016-03-27 MED ORDER — INSULIN LISPRO 100 UNIT/ML ~~LOC~~ SOLN: SUBCUTANEOUS | Status: DC

## 2016-03-27 NOTE — Telephone Encounter (Signed)
Even better >> please inject this 15 rather than 30 min before a meal

## 2016-03-27 NOTE — Telephone Encounter (Signed)
Please read message below and advise.  

## 2016-03-27 NOTE — Telephone Encounter (Signed)
Called pt and advised her per Dr Gherghe's message. Pt voiced understanding.  

## 2016-03-27 NOTE — Telephone Encounter (Signed)
Humulin is not being covered anymore it needs to be humalog please advise  Pharmacy has made pt aware.

## 2016-04-01 ENCOUNTER — Other Ambulatory Visit: Payer: Self-pay | Admitting: *Deleted

## 2016-04-01 MED ORDER — INSULIN ASPART 100 UNIT/ML ~~LOC~~ SOLN: SUBCUTANEOUS | Status: DC

## 2016-04-01 NOTE — Telephone Encounter (Signed)
Ins does not cover Humalog. Switching pt to Novolog per Dr Cruzita Lederer.

## 2016-04-03 ENCOUNTER — Telehealth: Payer: Self-pay | Admitting: Internal Medicine

## 2016-04-03 NOTE — Telephone Encounter (Signed)
Ophelia Charter from [place] stated that she need to know. the unit patient take for breakfast. Please advise  [phone]

## 2016-04-03 NOTE — Telephone Encounter (Signed)
I contacted the pharmacy and advised the novolog is 15 units before breakfast. Pharmacy voiced understanding.

## 2016-04-07 ENCOUNTER — Ambulatory Visit (INDEPENDENT_AMBULATORY_CARE_PROVIDER_SITE_OTHER): Payer: BC Managed Care – PPO | Admitting: Internal Medicine

## 2016-04-07 ENCOUNTER — Encounter: Payer: Self-pay | Admitting: Internal Medicine

## 2016-04-07 ENCOUNTER — Other Ambulatory Visit (INDEPENDENT_AMBULATORY_CARE_PROVIDER_SITE_OTHER): Payer: BC Managed Care – PPO | Admitting: *Deleted

## 2016-04-07 VITALS — BP 118/68 | HR 82 | Temp 97.8°F | Resp 12 | Wt 175.0 lb

## 2016-04-07 DIAGNOSIS — E1165 Type 2 diabetes mellitus with hyperglycemia: Secondary | ICD-10-CM

## 2016-04-07 DIAGNOSIS — IMO0001 Reserved for inherently not codable concepts without codable children: Secondary | ICD-10-CM

## 2016-04-07 DIAGNOSIS — Z794 Long term (current) use of insulin: Secondary | ICD-10-CM | POA: Diagnosis not present

## 2016-04-07 LAB — POCT GLYCOSYLATED HEMOGLOBIN (HGB A1C): Hemoglobin A1C: 10.4

## 2016-04-07 NOTE — Patient Instructions (Addendum)
Please continue: - Metformin 1000 mg 2x a day   Insulin Before breakfast Before lunch Before dinner  Regular >> Novolog 15 15 35  NPH 20 - 55   Please schedule anew eye exam.  Please come back for a follow-up appointment in 3 months with your log.

## 2016-04-07 NOTE — Progress Notes (Signed)
Patient ID: [name], female   DOB: [birth year], 55 y.o.   MRN: [id number]  HPI: [name] is a 54 y.o.-year-old female, returning for f/u for DM2, dx ~2000, insulin-dependent since 2010, uncontrolled, with complications (PN, + DR). Last visit 1.5 mo ago.  She is usually not compliant with her DM regimen. She does not take it as prescribed and can run out of insulin. Today, she comes back w/o a log or meter and was stretchingout the insulin doses to last more. She did not send the applications to [place] despite me filing out the form and giving her the Rx'es.  She again has a lot of excuses regarding why she is not taking care of herself.   Last hemoglobin A1c was: Lab Results  Component Value Date   HGBA1C 9.3 01/14/2016   HGBA1C 10.9* 09/25/2015   HGBA1C 11.2 09/19/2015  Had cortisone inj this month.  Pt was on a regimen of: - Metformin 1000 mg po bid  - Novolin 70/30  35 in am and 65 in pm - can miss doses, but less than before We stopped Glipizide 10 mg bid.  We had to switch to: - Metformin 1000 mg po bid Insulin Before breakfast Before lunch Before dinner  Regular  15 15 35  NPH 20 - 55    Pt checks her sugars 2-3x a day and they are high - not following the recommended regimen >> no log and no meter. Reviewed sugars from last visit as she is now checking sugars randomly... - am:  93-295 >> 76-183, 249 >> 120-239 >> 200s, 300 today >> 194-250 >> 200s (misses doses)  - 2h after b'fast: 120-195 >> 154 >> n/c > 203 >> n/c - lunch: n/c >> 85, 148, 445 >> 269  >> n/c >> n/c  - mid-afternoon: 106-196, 290 >> 118, 208 >> 180 >> 40x1, 78-159, 167 >> n/c - before dinner: 75-126, 189, 235 >> 36 x1, 46 x1, 81-156, 242 >> 60, 98-230, 255 >> n/c - 2h after dinner: 72, 114-292 >> n/c >> 216, 219, 451 >> n/c - bedtime: 92-266 >> 84-214, 350 >> 75-288 >> 66, 90-241, 338 >> 118-228 >> 158-255, 296 >> n/c >> up to 300 - nighttime: 145-239, 338 >> n/c Lowest sugar was 36 x1 >> 60 >>  ?; she has hypoglycemia awareness at 80. Highest sugar was 408 >> 300s  - no CKD, last BUN/creatinine:  Lab Results  Component Value Date   BUN 12 09/25/2015   CREATININE 0.58 09/25/2015  Last ACR 0.5 06/19/13. Not on ACEI/ARB.  - last set of lipids: Lab Results  Component Value Date   CHOL 152 09/25/2015   HDL 42.80 09/25/2015   LDLCALC 94 09/25/2015   LDLDIRECT 94.0 09/25/2015   TRIG 75.0 09/25/2015   CHOLHDL 4 09/25/2015  She is on Lipitor.   - last eye exam was in 03/2015. + DR L>R. + glaucoma Dr Arnoldo Morale. - + numbness and tingling in her feet. Seeing her podiatrist.  She also has a history of hypertension, hyperlipidemia-on Lipitor, episodic alcohol abuse, GERD, low back pain, osteoarthritis.   ROS: Constitutional: no weight loss, no fatigue, + nocturia Eyes: nol blurry vision, no xerophthalmia ENT: no sore throat, no nodules palpated in throat, no dysphagia/odynophagia, no hoarseness Cardiovascular: no CP/SOB/palpitations/leg  Respiratory: no cough/SOBswelling Gastrointestinal: no N/V/D/C Musculoskeletal: + muscle/+ joint aches  Skin: no rashes Neurological: no tremors/numbness/tingling/dizziness  I reviewed pt's medications, allergies, PMH, social hx, family hx, and changes were  documented in the history of present illness. Otherwise, unchanged from my initial visit note.  PE: BP 118/68 mmHg  Pulse 82  Temp(Src) 97.8 F (36.6 C) (Oral)  Resp 12  Wt 175 lb (79.379 kg)  SpO2 99% Body mass index is 30.02 kg/(m^2). Wt Readings from Last 3 Encounters:  04/07/16 175 lb (79.379 kg)  02/25/16 176 lb 9.6 oz (80.105 kg)  01/14/16 175 lb (79.379 kg)   Constitutional: overweight, in NAD Eyes: PERRLA, EOMI, no exophthalmos ENT: moist mucous membranes, no thyromegaly, no cervical lymphadenopathy Cardiovascular: RRR, No MRG Respiratory: CTA B Gastrointestinal: abdomen soft, NT, ND, BS+ Musculoskeletal: no deformities, strength intact in all 4 Skin: moist,  warm Neurological: no tremor with outstretched hands, DTR normal in all 4  ASSESSMENT: 1. DM2, insulin-dependent, uncontrolled, with complications - peripheral neuropathy - DR   PLAN:  1. Patient with long-standing,uncontrolled diabetes, now on basal-bolus insulin regimen with ReliOn N and R insulin (cheapest insulin available, limited finances). However, she just got Novolog which she will start in 1 week.  - Her sugars are high, but she is not checking frequently >> cannot make further changes... Again advised compliance before we can adjust her insulin doses and gain control of her diabetes. - reviewed last hba1c >> 9.3% (high) - I advised her to continue:  Patient Instructions   Please continue: - Metformin 1000 mg 2x a day   Insulin Before breakfast Before lunch Before dinner  Regular >> Novolog 15 15 35  NPH 20 - 55   Please schedule anew eye exam.  Please come back for a follow-up appointment in 3 months with your log.  - continue checking her sugars at different times of the day - check 3 times a day, rotating checks  - she needs a new eye exam - HbA1c today >> 10.4% (higher) - Return to clinic in 3 mo with sugar log - given a new one

## 2016-04-08 ENCOUNTER — Other Ambulatory Visit: Payer: Self-pay | Admitting: *Deleted

## 2016-04-08 ENCOUNTER — Telehealth: Payer: Self-pay | Admitting: Internal Medicine

## 2016-04-08 NOTE — Telephone Encounter (Signed)
After Hours Call: Caller states they needed to speak to someone in the office

## 2016-04-08 NOTE — Telephone Encounter (Signed)
Returned pt's call.

## 2016-05-11 ENCOUNTER — Other Ambulatory Visit: Payer: Self-pay | Admitting: Internal Medicine

## 2016-06-05 ENCOUNTER — Other Ambulatory Visit: Payer: Self-pay | Admitting: Internal Medicine

## 2016-06-05 ENCOUNTER — Telehealth: Payer: Self-pay | Admitting: *Deleted

## 2016-06-05 ENCOUNTER — Ambulatory Visit (INDEPENDENT_AMBULATORY_CARE_PROVIDER_SITE_OTHER): Payer: BC Managed Care – PPO | Admitting: Family

## 2016-06-05 ENCOUNTER — Encounter: Payer: Self-pay | Admitting: Family

## 2016-06-05 VITALS — BP 120/80 | HR 95 | Temp 98.0°F | Wt 174.0 lb

## 2016-06-05 DIAGNOSIS — N949 Unspecified condition associated with female genital organs and menstrual cycle: Secondary | ICD-10-CM

## 2016-06-05 DIAGNOSIS — R739 Hyperglycemia, unspecified: Secondary | ICD-10-CM | POA: Diagnosis not present

## 2016-06-05 DIAGNOSIS — R350 Frequency of micturition: Secondary | ICD-10-CM | POA: Diagnosis not present

## 2016-06-05 DIAGNOSIS — N898 Other specified noninflammatory disorders of vagina: Secondary | ICD-10-CM

## 2016-06-05 DIAGNOSIS — L298 Other pruritus: Secondary | ICD-10-CM | POA: Diagnosis not present

## 2016-06-05 LAB — BASIC METABOLIC PANEL
BUN: 10 mg/dL (ref 7–25)
CO2: 21 mmol/L (ref 20–31)
CREATININE: 0.73 mg/dL (ref 0.50–1.05)
Calcium: 9.2 mg/dL (ref 8.6–10.4)
Chloride: 99 mmol/L (ref 98–110)
Glucose, Bld: 359 mg/dL — ABNORMAL HIGH (ref 65–99)
Potassium: 3.9 mmol/L (ref 3.5–5.3)
SODIUM: 135 mmol/L (ref 135–146)

## 2016-06-05 LAB — POC URINALSYSI DIPSTICK (AUTOMATED)
LEUKOCYTES UA: NEGATIVE
Spec Grav, UA: >=1.03
Urobilinogen, UA: NEGATIVE
pH, UA: 6

## 2016-06-05 MED ORDER — FLUCONAZOLE 150 MG PO TABS
150.0000 mg | ORAL_TABLET | Freq: Once | ORAL | Status: DC
Start: 2016-06-05 — End: 2016-06-19

## 2016-06-05 NOTE — Patient Instructions (Addendum)
Take blood sugar 3 times a day and keep a log.   No atorvastatin when taking yeast infection medication.   Make f.u appt with PCP.   If there is no improvement in your symptoms, or if there is any worsening of symptoms, or if you have any additional concerns, please return for re-evaluation; or, if we are closed, consider going to the Emergency Room for evaluation if symptoms urgent.  Monilial Vaginitis Vaginitis in a soreness, swelling and redness (inflammation) of the vagina and vulva. Monilial vaginitis is not a sexually transmitted infection. CAUSES  Yeast vaginitis is caused by yeast (candida) that is normally found in your vagina. With a yeast infection, the candida has overgrown in number to a point that upsets the chemical balance. SYMPTOMS   White, thick vaginal discharge.  Swelling, itching, redness and irritation of the vagina and possibly the lips of the vagina (vulva).  Burning or painful urination.  Painful intercourse. DIAGNOSIS  Things that may contribute to monilial vaginitis are:  Postmenopausal and virginal states.  Pregnancy.  Infections.  Being tired, sick or stressed, especially if you had monilial vaginitis in the past.  Diabetes. Good control will help lower the chance.  Birth control pills.  Tight fitting garments.  Using bubble bath, feminine sprays, douches or deodorant tampons.  Taking certain medications that kill germs (antibiotics).  Sporadic recurrence can occur if you become ill. TREATMENT  Your caregiver will give you medication.  There are several kinds of anti monilial vaginal creams and suppositories specific for monilial vaginitis. For recurrent yeast infections, use a suppository or cream in the vagina 2 times a week, or as directed.  Anti-monilial or steroid cream for the itching or irritation of the vulva may also be used. Get your caregiver's permission.  Painting the vagina with methylene blue solution may help if the  monilial cream does not work.  Eating yogurt may help prevent monilial vaginitis. HOME CARE INSTRUCTIONS   Finish all medication as prescribed.  Do not have sex until treatment is completed or after your caregiver tells you it is okay.  Take warm sitz baths.  Do not douche.  Do not use tampons, especially scented ones.  Wear cotton underwear.  Avoid tight pants and panty hose.  Tell your sexual partner that you have a yeast infection. They should go to their caregiver if they have symptoms such as mild rash or itching.  Your sexual partner should be treated as well if your infection is difficult to eliminate.  Practice safer sex. Use condoms.  Some vaginal medications cause latex condoms to fail. Vaginal medications that harm condoms are:  Cleocin cream.  Butoconazole (Femstat).  Terconazole (Terazol) vaginal suppository.  Miconazole (Monistat) (may be purchased over the counter). SEEK MEDICAL CARE IF:   You have a temperature by mouth above 102 F (38.9 C).  The infection is getting worse after 2 days of treatment.  The infection is not getting better after 3 days of treatment.  You develop blisters in or around your vagina.  You develop vaginal bleeding, and it is not your menstrual period.  You have pain when you urinate.  You develop intestinal problems.  You have pain with sexual intercourse.   This information is not intended to replace advice given to you by your health care provider. Make sure you discuss any questions you have with your health care provider.   Document Released: 08/26/2005 Document Revised: 02/08/2012 Document Reviewed: 05/20/2015 Elsevier Interactive Patient Education [employer].  Basic Carbohydrate Counting for Diabetes Mellitus Carbohydrate counting is a method for keeping track of the amount of carbohydrates you eat. Eating carbohydrates naturally increases the level of sugar (glucose) in your blood, so it is  important for you to know the amount that is okay for you to have in every meal. Carbohydrate counting helps keep the level of glucose in your blood within normal limits. The amount of carbohydrates allowed is different for every person. A dietitian can help you calculate the amount that is right for you. Once you know the amount of carbohydrates you can have, you can count the carbohydrates in the foods you want to eat. Carbohydrates are found in the following foods:  Grains, such as breads and cereals.  Dried beans and soy products.  Starchy vegetables, such as potatoes, peas, and corn.  Fruit and fruit juices.  Milk and yogurt.  Sweets and snack foods, such as cake, cookies, candy, chips, soft drinks, and fruit drinks. CARBOHYDRATE COUNTING There are two ways to count the carbohydrates in your food. You can use either of the methods or a combination of both. Reading the "Nutrition Facts" on [place] The "Nutrition Facts" is an area that is included on the labels of almost all packaged food and beverages in the [place]. It includes the serving size of that food or beverage and information about the nutrients in each serving of the food, including the grams (g) of carbohydrate per serving.  Decide the number of servings of this food or beverage that you will be able to eat or drink. Multiply that number of servings by the number of grams of carbohydrate that is listed on the label for that serving. The total will be the amount of carbohydrates you will be having when you eat or drink this food or beverage. Learning Standard Serving Sizes of Food When you eat food that is not packaged or does not include "Nutrition Facts" on the label, you need to measure the servings in order to count the amount of carbohydrates.A serving of most carbohydrate-rich foods contains about 15 g of carbohydrates. The following list includes serving sizes of carbohydrate-rich foods that provide 15 g  ofcarbohydrate per serving:   1 slice of bread (1 oz) or 1 six-inch tortilla.    of a hamburger bun or English muffin.  4-6 crackers.   cup unsweetened dry cereal.    cup hot cereal.   cup rice or pasta.    cup mashed potatoes or  of a large baked potato.  1 cup fresh fruit or one small piece of fruit.    cup canned or frozen fruit or fruit juice.  1 cup milk.   cup plain fat-free yogurt or yogurt sweetened with artificial sweeteners.   cup cooked dried beans or starchy vegetable, such as peas, corn, or potatoes.  Decide the number of standard-size servings that you will eat. Multiply that number of servings by 15 (the grams of carbohydrates in that serving). For example, if you eat 2 cups of strawberries, you will have eaten 2 servings and 30 g of carbohydrates (2 servings x 15 g = 30 g). For foods such as soups and casseroles, in which more than one food is mixed in, you will need to count the carbohydrates in each food that is included. EXAMPLE OF CARBOHYDRATE COUNTING Sample Dinner  3 oz chicken breast.   cup of brown rice.   cup of corn.  1 cup milk.   1 cup strawberries  with sugar-free whipped topping.  Carbohydrate Calculation Step 1: Identify the foods that contain carbohydrates:   Rice.   Corn.   Milk.   Strawberries. Step 2:Calculate the number of servings eaten of each:   2 servings of rice.   1 serving of corn.   1 serving of milk.   1 serving of strawberries. Step 3: Multiply each of those number of servings by 15 g:   2 servings of rice x 15 g = 30 g.   1 serving of corn x 15 g = 15 g.   1 serving of milk x 15 g = 15 g.   1 serving of strawberries x 15 g = 15 g. Step 4: Add together all of the amounts to find the total grams of carbohydrates eaten: 30 g + 15 g + 15 g + 15 g = 75 g.   This information is not intended to replace advice given to you by your health care provider. Make sure you discuss any  questions you have with your health care provider.   Document Released: 11/16/2005 Document Revised: 12/07/2014 Document Reviewed: 10/13/2013 Elsevier Interactive Patient Education [employer].

## 2016-06-05 NOTE — Progress Notes (Signed)
Subjective:    Patient ID: [name], female    DOB: [birth year], 55 y.o.   MRN: [id number]   [name] is a 55 y.o. female who presents today for an acute visit.    HPI Comments: Patient here for evaluation of urinary frequency for several weeks. She also endorses vaginal itching. Endorses occasional blurry vision 'from sugar' and dry mouth. No concern for STDs. No fever, chills. Knows blood sugars are running high and states 'hasn't been taking care of herself'. Hasnt checked blood sugar today and hasnt taken DM medication this afternoon ( on our way after appointment).  2 days ago BS 414.  No increased thirst, hunger.   Past Medical History  Diagnosis Date  . Hyperlipidemia   . Diabetes mellitus   . Gastritis with bleeding due to alcohol 2010    [facility] admission  . Campylobacter enteritis May 2012    [facility] admission  . Arthritis   . Carpal tunnel syndrome, bilateral   . Hypertension     no meds  . Wears glasses   . GERD (gastroesophageal reflux disease)    Allergies: Oxycodone-acetaminophen and Penicillins Current Outpatient Prescriptions on File Prior to Visit  Medication Sig Dispense Refill  . atorvastatin (LIPITOR) 40 MG tablet TAKE ONE TABLET BY MOUTH ONCE DAILY 30 tablet 0  . Diclofenac Sodium 2 % SOLN Apply 1 pump twice daily. 112 g 3  . etodolac (LODINE) 400 MG tablet Take 400 mg by mouth daily.     Marland Kitchen gabapentin (NEURONTIN) 300 MG capsule     . glucose blood (BAYER CONTOUR TEST) test strip TEST BLOOD SUGARS 3 TIMES DAILY 200 each 11  . HYDROcodone-acetaminophen (NORCO) 10-325 MG per tablet Take 1 tablet by mouth at bedtime as needed and may repeat dose one time if needed for severe pain. 60 tablet 0  . hydrocortisone (ANUSOL-HC) 25 MG suppository Place 1 suppository (25 mg total) rectally 2 (two) times daily. 12 suppository 0  . insulin aspart (NOVOLOG) 100 UNIT/ML injection Inject into the skin 15 minutes before breakfast, 15 units before lunch and 35 units before  dinner (Patient taking differently: Inject into the skin 15 units before breakfast, 15 units before lunch and 35 units before dinner) 20 mL 2  . insulin NPH Human (HUMULIN N) 100 UNIT/ML injection Inject 20 units in am and 55 units before dinner 60 mL 1  . Lanc[employer] Devices (EASY TOUCH LANCING DEVICE) MISC Use to check diabetes 4 times daily  E 11.65 1 each 11  . lansoprazole (PREVACID) 30 MG capsule Take 1 capsule (30 mg total) by mouth daily at 12 noon. 30 capsule 2  . lidocaine (LIDODERM) 5 %     . metFORMIN (GLUCOPHAGE) 1000 MG tablet Take 1 tablet (1,000 mg total) by mouth 2 (two) times daily with a meal. 60 tablet 5  . Naproxen-Esomeprazole 500-20 MG TBEC Take 1 tablet by mouth daily.    Marland Kitchen nystatin (MYCOSTATIN/NYSTOP) 100000 UNIT/GM POWD Apply powder to affected area twice daily until rash has resolved 15 g 3  . omeprazole (PRILOSEC) 40 MG capsule TAKE ONE CAPSULE BY MOUTH ONCE DAILY 30 capsule 3  . terbinafine (LAMISIL) 250 MG tablet     . valACYclovir (VALTREX) 500 MG tablet     . [DISCONTINUED] esomeprazole (NEXIUM) 40 MG capsule Take 40 mg by mouth daily.     No current facility-administered medications on file prior to visit.    Social History  Substance Use Topics  . Smoking  status: Former Smoker    Quit date: 05/09/2012  . Smokeless tobacco: Never Used  . Alcohol Use: No     Comment: not for 5 yr    Review of Systems  Constitutional: Negative for fever and chills.  Eyes: Positive for visual disturbance.  Respiratory: Negative for cough.   Cardiovascular: Negative for chest pain and palpitations.  Gastrointestinal: Negative for nausea and vomiting.  Endocrine: Negative for polydipsia, polyphagia and polyuria.  Genitourinary: Positive for frequency. Negative for dysuria, hematuria, flank pain, vaginal bleeding, vaginal discharge and vaginal pain.      Objective:    BP 120/80 mmHg  Pulse 95  Temp(Src) 98 F (36.7 C)  Wt 174 lb (78.926 kg)  SpO2 98%   Physical Exam    Constitutional: She appears well-developed and well-nourished.  Cardiovascular: Normal rate, regular rhythm, normal heart sounds and normal pulses.   Pulmonary/Chest: Effort normal and breath sounds normal. She has no wheezes. She has no rhonchi. She has no rales.  Abdominal: There is no CVA tenderness.  Neurological: She is alert.  Skin: Skin is warm and dry.  Psychiatric: She has a normal mood and affect. Her speech is normal and behavior is normal. Thought content normal.  Vitals reviewed.      Assessment & Plan:   1. Frequent urination UA negative nitrites, leukocytes. UA positive for glucose. - Urine culture - POCT Urinalysis Dipstick (Automated)  2. Vaginal itching Suspect  vulvovaginal candidiasis. Patient politely declined pelvic exam. - fluconazole (DIFLUCAN) 150 MG tablet; Take 1 tablet (150 mg total) by mouth once. Take one tablet PO once. If continue to have symptoms, may take one tablet PO 3 days later.  Dispense: 2 tablet; Refill: 1  3. Hyperglycemia Point-of-care blood glucose 410. Pending BMP to evaluate electrolytes. Patient and I discussed that if it blood sugar increases over the 414 tonight or over the weekend, she must go to the emergency room and she agreed with this plan. Also advised that if her electrolytes are not within normal limits, I will call and advise to go to emergency room. She stated she will take her insulin she gets home and check her sugar.   - Basic metabolic panel    I am having [name] maintain her etodolac, hydrocortisone, lansoprazole, gabapentin, valACYclovir, EASY TOUCH LANCING DEVICE, Naproxen-Esomeprazole, HYDROcodone-acetaminophen, nystatin, glucose blood, Diclofenac Sodium, metFORMIN, omeprazole, insulin NPH Human, insulin aspart, lidocaine, terbinafine, and atorvastatin.   No orders of the defined types were placed in this encounter.    Return precautions given.   Start medications as prescribed and explained to patient on  After Visit Summary ( AVS). Risks, benefits, and alternatives of the medications and treatment plan prescribed today were discussed, and patient expressed understanding.   Education regarding symptom management and diagnosis given to patient.   Follow-up:Plan follow-up and return precautions given if any worsening symptoms or change in condition.   Continue to follow with Crecencio Mc, MD for routine health maintenance.   [name] and I agreed with plan.   Mable Paris, FNP

## 2016-06-05 NOTE — Telephone Encounter (Signed)
Patient stated that she has a yeast infection and stated that [place] usually calls her in this medication to treat. Pt was advised that Dr.Tullo may want her to be seen. Pt was okay with coming in if need be.

## 2016-06-05 NOTE — Telephone Encounter (Signed)
Dr. Derrel Nip has not seen patient since 10/16 needs to be seen.

## 2016-06-05 NOTE — Telephone Encounter (Signed)
Patient stated that she will need a Rx diflucan.  Pharmacy [facility] [place].

## 2016-06-05 NOTE — Telephone Encounter (Signed)
Per Juliann Pulse patient needed to be seen so pt was scheduled with [facility] for 4 p.m. 06/05/16

## 2016-06-07 LAB — URINE CULTURE

## 2016-06-08 ENCOUNTER — Telehealth: Payer: Self-pay | Admitting: Family

## 2016-06-08 DIAGNOSIS — N390 Urinary tract infection, site not specified: Secondary | ICD-10-CM

## 2016-06-08 MED ORDER — SULFAMETHOXAZOLE-TRIMETHOPRIM 800-160 MG PO TABS: 1.0000 | ORAL_TABLET | Freq: Two times a day (BID) | ORAL | Status: DC

## 2016-06-08 NOTE — Telephone Encounter (Signed)
Please call patient and let her know lab work reflect high blood sugar.. Which we thought. However electrolytes WNL.   Urine culture reflects UTI. Have sent rx to pharmacy.  How are blood sugars today? If still running high, she needs to schedule f/u OV.

## 2016-06-10 NOTE — Telephone Encounter (Signed)
[name],  Was this patient contacted?   Its makes for easy tracking if after you call her, the Encounter is closed. This way I know it's been done :)   Thanks as always!  M

## 2016-06-10 NOTE — Telephone Encounter (Signed)
Pt was notified of results. She said the last time she checked her BG was last night and it's still running high (400s). She said she has a f/u appt on 7/21.

## 2016-06-19 ENCOUNTER — Ambulatory Visit (INDEPENDENT_AMBULATORY_CARE_PROVIDER_SITE_OTHER): Payer: BC Managed Care – PPO | Admitting: Internal Medicine

## 2016-06-19 ENCOUNTER — Other Ambulatory Visit (HOSPITAL_COMMUNITY)
Admission: RE | Admit: 2016-06-19 | Discharge: 2016-06-19 | Disposition: A | Discharge status: Home / Self Care | Payer: BC Managed Care – PPO | Source: Ambulatory Visit | Attending: Internal Medicine | Admitting: Internal Medicine

## 2016-06-19 ENCOUNTER — Encounter: Payer: Self-pay | Admitting: Internal Medicine

## 2016-06-19 VITALS — BP 122/84 | HR 92 | Temp 98.6°F | Resp 16 | Ht 64.0 in | Wt 174.0 lb

## 2016-06-19 DIAGNOSIS — Z9111 Patient's noncompliance with dietary regimen: Secondary | ICD-10-CM

## 2016-06-19 DIAGNOSIS — M25562 Pain in left knee: Secondary | ICD-10-CM

## 2016-06-19 DIAGNOSIS — Z01419 Encounter for gynecological examination (general) (routine) without abnormal findings: Secondary | ICD-10-CM | POA: Insufficient documentation

## 2016-06-19 DIAGNOSIS — Z1151 Encounter for screening for human papillomavirus (HPV): Secondary | ICD-10-CM | POA: Diagnosis present

## 2016-06-19 DIAGNOSIS — Z794 Long term (current) use of insulin: Secondary | ICD-10-CM

## 2016-06-19 DIAGNOSIS — Z0001 Encounter for general adult medical examination with abnormal findings: Secondary | ICD-10-CM

## 2016-06-19 DIAGNOSIS — R350 Frequency of micturition: Secondary | ICD-10-CM

## 2016-06-19 DIAGNOSIS — E1165 Type 2 diabetes mellitus with hyperglycemia: Secondary | ICD-10-CM | POA: Diagnosis not present

## 2016-06-19 DIAGNOSIS — E1310 Other specified diabetes mellitus with ketoacidosis without coma: Secondary | ICD-10-CM | POA: Diagnosis not present

## 2016-06-19 DIAGNOSIS — Z124 Encounter for screening for malignant neoplasm of cervix: Secondary | ICD-10-CM

## 2016-06-19 DIAGNOSIS — N390 Urinary tract infection, site not specified: Secondary | ICD-10-CM

## 2016-06-19 DIAGNOSIS — Z9114 Patient's other noncompliance with medication regimen: Secondary | ICD-10-CM

## 2016-06-19 DIAGNOSIS — Z0000 Encounter for general adult medical examination without abnormal findings: Secondary | ICD-10-CM

## 2016-06-19 DIAGNOSIS — IMO0001 Reserved for inherently not codable concepts without codable children: Secondary | ICD-10-CM

## 2016-06-19 DIAGNOSIS — L732 Hidradenitis suppurativa: Secondary | ICD-10-CM | POA: Diagnosis not present

## 2016-06-19 DIAGNOSIS — Z91199 Patient's noncompliance with other medical treatment and regimen due to unspecified reason: Secondary | ICD-10-CM

## 2016-06-19 DIAGNOSIS — E1110 Type 2 diabetes mellitus with ketoacidosis without coma: Secondary | ICD-10-CM

## 2016-06-19 LAB — POCT URINALYSIS DIPSTICK
Blood, UA: NEGATIVE
GLUCOSE UA: 100
Leukocytes, UA: NEGATIVE
NITRITE UA: NEGATIVE
Protein, UA: 30
Spec Grav, UA: >=1.03
UROBILINOGEN UA: 1
pH, UA: 5.5

## 2016-06-19 LAB — COMPREHENSIVE METABOLIC PANEL
ALK PHOS: 93 U/L (ref 33–130)
ALT: 14 U/L (ref 6–29)
AST: 12 U/L (ref 10–35)
Albumin: 3.8 g/dL (ref 3.6–5.1)
BUN: 16 mg/dL (ref 7–25)
CALCIUM: 9 mg/dL (ref 8.6–10.4)
CHLORIDE: 102 mmol/L (ref 98–110)
CO2: 20 mmol/L (ref 20–31)
Creat: 0.63 mg/dL (ref 0.50–1.05)
GLUCOSE: 236 mg/dL — AB (ref 65–99)
POTASSIUM: 3.9 mmol/L (ref 3.5–5.3)
Sodium: 138 mmol/L (ref 135–146)
Total Bilirubin: 0.3 mg/dL (ref 0.2–1.2)
Total Protein: 6.6 g/dL (ref 6.1–8.1)

## 2016-06-19 LAB — LIPID PANEL
CHOL/HDL RATIO: 2.8 ratio (ref <=5.0)
CHOLESTEROL: 124 mg/dL — AB (ref 125–200)
HDL: 44 mg/dL — AB (ref >=46)
LDL CALC: 38 mg/dL (ref <130)
TRIGLYCERIDES: 211 mg/dL — AB (ref <150)
VLDL: 42 mg/dL — AB (ref <30)

## 2016-06-19 LAB — MICROALBUMIN / CREATININE URINE RATIO
CREATININE, U: 280 mg/dL
Microalb Creat Ratio: 0.7 mg/g (ref 0.0–30.0)
Microalb, Ur: 1.9 mg/dL (ref 0.0–1.9)

## 2016-06-19 MED ORDER — VALACYCLOVIR HCL 500 MG PO TABS: 500.0000 mg | ORAL_TABLET | Freq: Three times a day (TID) | ORAL | Status: DC

## 2016-06-19 MED ORDER — SULFAMETHOXAZOLE-TRIMETHOPRIM 800-160 MG PO TABS: 1.0000 | ORAL_TABLET | Freq: Two times a day (BID) | ORAL | Status: DC

## 2016-06-19 MED ORDER — INSULIN NPH (HUMAN) (ISOPHANE) 100 UNIT/ML ~~LOC~~ SUSP: SUBCUTANEOUS | Status: DC

## 2016-06-19 MED ORDER — MELOXICAM 15 MG PO TABS
15.0000 mg | ORAL_TABLET | Freq: Every day | ORAL | Status: DC
Start: 2016-06-19 — End: 2016-07-24

## 2016-06-19 NOTE — Progress Notes (Addendum)
Patient ID: [name], female    DOB: [birth year]  Age: 55 y.o. MRN: [id number]  The patient is here for annual  examination and management of other chronic and acute problems.    Lasf PAP 2010 Last mammogram 2015    The risk factors are reflected in the social history.  The roster of all physicians providing medical care to patient - is listed in the Snapshot section of the chart.  Home safety : The patient has smoke detectors in the home. They wear seatbelts.  There are no firearms at home. There is no violence in the home.   There is no risks for hepatitis, STDs or HIV. There is no   history of blood transfusion. They have no travel history to infectious disease endemic areas of the world.  The patient has not seen their dentist in the last six month. They have not seen their eye doctor in the last year. Despite repeated reminders.  They do not  have excessive sun exposure. Discussed the need for sun protection: hats, long sleeves and use of sunscreen if there is significant sun exposure.   Diet: the importance of a healthy diet is discussed. They do have a healthy diet.  The benefits of regular aerobic exercise were discussed. She does not eercise due to joint pain and personal lifestyle choices.     Depression screen: there are no signs or vegative symptoms of depression- irritability, change in appetite, anhedonia, sadness/tearfullness.   The following portions of the patient's history were reviewed and updated as appropriate: allergies, current medications, past family history, past medical history,  past surgical history, past social history  and problem list.  Visual acuity was not assessed per patient preference since she has regular follow up with her ophthalmologist. Hearing and body mass index were assessed and reviewed.   During the course of the visit the patient was educated and counseled about appropriate screening and preventive services including : fall prevention  , diabetes screening, nutrition counseling, colorectal cancer screening, and recommended immunizations.    CC: The primary encounter diagnosis was UTI (lower urinary tract infection). Diagnoses of Left knee pain, Uncontrolled type 2 diabetes mellitus with ketoacidosis without coma, with long-term current use of insulin ([place]), Cervical cancer screening, Noncompliance with diet and medication regimen, HIDRADENITIS SUPPURATIVA, Uncontrolled type 2 diabetes mellitus without complication, with long-term current use of insulin ([place]), Visit for preventive health examination, and Increased urinary frequency were also pertinent to this visit.  Has a draining boil in left pubic area and another non draining tender area under right breast   Not checking blood sugars..  Rationing her insulin toward the end of the month.  Not following a Low I diet or exercising  History [place] has a past medical history of Arthritis; Campylobacter enteritis (May 2012); Carpal tunnel syndrome, bilateral; Diabetes mellitus; Gastritis with bleeding due to alcohol (2010); GERD (gastroesophageal reflux disease); Hyperlipidemia; Hypertension; and Wears glasses.   She has a past surgical history that includes endometrial biopsy (2004); Tonsillectomy; Foot Osteotomy (2010); Cyst excision (1995); Carpal tunnel release (Left, 05/14/2014); and Ulnar nerve transposition (Left, 05/14/2014).   Her family history includes Diabetes in her maternal grandmother; Hypertension in her maternal grandmother and mother; Stroke in her father.She reports that she quit smoking about 4 years ago. She has never used smokeless tobacco. She reports that she does not drink alcohol or use drugs.  Outpatient Medications Prior to Visit  Medication Sig Dispense Refill  . atorvastatin (LIPITOR) 40 MG  tablet TAKE ONE TABLET BY MOUTH ONCE DAILY 30 tablet 0  . gabapentin (NEURONTIN) 300 MG capsule     . glucose blood (BAYER CONTOUR TEST) test strip TEST BLOOD SUGARS 3  TIMES DAILY 200 each 11  . insulin aspart (NOVOLOG) 100 UNIT/ML injection Inject into the skin 15 minutes before breakfast, 15 units before lunch and 35 units before dinner (Patient taking differently: Inject into the skin 15 units before breakfast, 15 units before lunch and 35 units before dinner) 20 mL 2  . Lancet Devices (EASY TOUCH LANCING DEVICE) MISC Use to check diabetes 4 times daily  E 11.65 1 each 11  . lansoprazole (PREVACID) 30 MG capsule Take 1 capsule (30 mg total) by mouth daily at 12 noon. 30 capsule 2  . metFORMIN (GLUCOPHAGE) 1000 MG tablet Take 1 tablet (1,000 mg total) by mouth 2 (two) times daily with a meal. 60 tablet 5  . nystatin (MYCOSTATIN/NYSTOP) 100000 UNIT/GM POWD Apply powder to affected area twice daily until rash has resolved 15 g 3  . omeprazole (PRILOSEC) 40 MG capsule TAKE ONE CAPSULE BY MOUTH ONCE DAILY 30 capsule 3  . Diclofenac Sodium 2 % SOLN Apply 1 pump twice daily. 112 g 3  . etodolac (LODINE) 400 MG tablet Take 400 mg by mouth daily.     . Naproxen-Esomeprazole 500-20 MG TBEC Take 1 tablet by mouth daily.    Marland Kitchen HYDROcodone-acetaminophen (NORCO) 10-325 MG per tablet Take 1 tablet by mouth at bedtime as needed and may repeat dose one time if needed for severe pain. (Patient not taking: Reported on 06/19/2016) 60 tablet 0  . lidocaine (LIDODERM) 5 % Reported on 06/19/2016    . terbinafine (LAMISIL) 250 MG tablet Reported on 06/19/2016    . fluconazole (DIFLUCAN) 150 MG tablet Take 1 tablet (150 mg total) by mouth once. Take one tablet PO once. If continue to have symptoms, may take one tablet PO 3 days later. 2 tablet 1  . hydrocortisone (ANUSOL-HC) 25 MG suppository Place 1 suppository (25 mg total) rectally 2 (two) times daily. 12 suppository 0  . insulin NPH Human (HUMULIN N) 100 UNIT/ML injection Inject 20 units in am and 55 units before dinner (Patient not taking: Reported on 06/19/2016) 60 mL 1  . sulfamethoxazole-trimethoprim (BACTRIM DS,SEPTRA DS) 800-160 MG  tablet Take 1 tablet by mouth 2 (two) times daily. (Patient not taking: Reported on 06/19/2016) 6 tablet 0  . valACYclovir (VALTREX) 500 MG tablet Reported on 06/19/2016     No facility-administered medications prior to visit.     Review of Systems   Patient denies headache, fevers, malaise, unintentional weight loss, skin rash, eye pain, sinus congestion and sinus pain, sore throat, dysphagia,  hemoptysis , cough, dyspnea, wheezing, chest pain, palpitations, orthopnea, edema, abdominal pain, nausea, melena, diarrhea, constipation, flank pain, dysuria, hematuria, urinary  Frequency, nocturia, numbness, tingling, seizures,  Focal weakness, Loss of consciousness,  Tremor, insomnia, depression, anxiety, and suicidal ideation.      Objective:  BP 122/84   Pulse 92   Temp 98.6 F (37 C) (Oral)   Resp 16   Ht 5\' 4"  (1.626 m)   Wt 174 lb (78.9 kg)   BMI 29.87 kg/m   Physical Exam   General Appearance:    Alert, cooperative, no distress, appears stated age  Head:    Normocephalic, without obvious abnormality, atraumatic  Eyes:    PERRL, conjunctiva/corneas clear, EOM's intact, fundi    benign, both eyes  Ears:  Normal TM's and external ear canals, both ears  Nose:   Nares normal, septum midline, mucosa normal, no drainage    or sinus tenderness  Throat:   Lips, mucosa, and tongue normal; teeth and gums normal  Neck:   Supple, symmetrical, trachea midline, no adenopathy;    thyroid:  no enlargement/tenderness/nodules; no carotid   bruit or JVD  Back:     Symmetric, no curvature, ROM normal, no CVA tenderness  Lungs:     Clear to auscultation bilaterally, respirations unlabored  Chest Wall:    No tenderness or deformity   Heart:    Regular rate and rhythm, S1 and S2 normal, no murmur, rub   or gallop  Breast Exam:    No tenderness, masses, or nipple abnormality  Abdomen:     Soft, non-tender, bowel sounds active all four quadrants,    no masses, no organomegaly  Genitalia:     Pelvic: cervix normal in appearance, external genitalia normal, no adnexal masses or tenderness, no cervical motion tenderness, rectovaginal septum normal, uterus normal size, shape, and consistency and vagina normal without discharge  Extremities:   Extremities normal, atraumatic, no cyanosis or edema  Pulses:   2+ and symmetric all extremities  Skin:   draining boil in left pubic area , enflamed area  under right breast Skin color, texture, turgor normal, no rashes or lesions  Lymph nodes:   Cervical, supraclavicular, and axillary nodes normal  Neurologic:   CNII-XII intact, normal strength, sensation and reflexes    throughout      Assessment & Plan:   Problem List Items Addressed This Visit    Diabetes mellitus type 2, uncontrolled, without complications ([place])    Now managed by Dr Renne Crigler with continue lack of control due ot patient noncompliance.  Has not been using NPH at all.  Med sent to pharmacy again and verbal instructions given.       Relevant Medications   insulin NPH Human (HUMULIN N) 100 UNIT/ML injection   HIDRADENITIS SUPPURATIVA    She has a current falre involving left groin and right breast.  Septra DS prescribed       Noncompliance with diet and medication regimen    Her diabetes is uncontrolled.  She has not been using NPH twice daily , which was prescribed by Dr. Renne Crigler in May,  Instructions written out and explained verbally to patient and medication sent  to pharmacy.  She acknowledges her noncompliance but has no real incentive to change despite experiencing urinary frequency due to glucosuria and being offered hospital  admission recently by NP       Visit for preventive health examination    Annual comprehensive preventive exam was done as well as an evaluation and management of chronic conditions .  During the course of the visit the patient was educated and counseled about appropriate screening and preventive services including :  diabetes screening, lipid  analysis with projected  10 year  risk for CAD , nutrition counseling, breast, cervical and colorectal cancer screening, and recommended immunizations.  Printed recommendations for health maintenance screenings was given      Increased urinary frequency    Secondary to glucosuria.  No signs of UTI by POCT.  Improved adherence to diabetes regemen advised.       Left knee pain   Relevant Medications   meloxicam (MOBIC) 15 MG tablet    Other Visit Diagnoses    UTI (lower urinary tract infection)    -  Primary  Relevant Medications   valACYclovir (VALTREX) 500 MG tablet   sulfamethoxazole-trimethoprim (BACTRIM DS,SEPTRA DS) 800-160 MG tablet   Other Relevant Orders   POCT urinalysis dipstick (Completed)   Uncontrolled type 2 diabetes mellitus with ketoacidosis without coma, with long-term current use of insulin ([facility])       Relevant Medications   insulin NPH Human (HUMULIN N) 100 UNIT/ML injection   Other Relevant Orders   Microalbumin / creatinine urine ratio (Completed)   Lipid Panel (Completed)   Comprehensive metabolic panel (Completed)   Cervical cancer screening       Relevant Orders   Cytology - PAP      I have discontinued Ms. Massimo's etodolac, hydrocortisone, Naproxen-Esomeprazole, Diclofenac Sodium, and fluconazole. I have also changed her valACYclovir. Additionally, I am having her start on meloxicam. Lastly, I am having her maintain her lansoprazole, gabapentin, EASY TOUCH LANCING DEVICE, HYDROcodone-acetaminophen, nystatin, glucose blood, metFORMIN, omeprazole, insulin aspart, lidocaine, terbinafine, atorvastatin, insulin NPH Human, and sulfamethoxazole-trimethoprim.  Meds ordered this encounter  Medications  . valACYclovir (VALTREX) 500 MG tablet    Sig: Take 1 tablet (500 mg total) by mouth 3 (three) times daily. Reported on 06/19/2016    Dispense:  21 tablet    Refill:  3    DO NOT REFILL NOW,  KEEP ON FILE FOR FUTURE REFILL REQUEST  . insulin NPH Human (HUMULIN  N) 100 UNIT/ML injection    Sig: Inject 20 units in am and 55 units before dinner    Dispense:  60 mL    Refill:  1  . sulfamethoxazole-trimethoprim (BACTRIM DS,SEPTRA DS) 800-160 MG tablet    Sig: Take 1 tablet by mouth 2 (two) times daily.    Dispense:  14 tablet    Refill:  0  . meloxicam (MOBIC) 15 MG tablet    Sig: Take 1 tablet (15 mg total) by mouth daily.    Dispense:  30 tablet    Refill:  0    Medications Discontinued During This Encounter  Medication Reason  . fluconazole (DIFLUCAN) 150 MG tablet Completed Course  . hydrocortisone (ANUSOL-HC) 25 MG suppository Completed Course  . valACYclovir (VALTREX) 500 MG tablet Reorder  . insulin NPH Human (HUMULIN N) 100 UNIT/ML injection Reorder  . sulfamethoxazole-trimethoprim (BACTRIM DS,SEPTRA DS) 800-160 MG tablet Reorder  . etodolac (LODINE) 400 MG tablet   . Diclofenac Sodium 2 % SOLN   . Naproxen-Esomeprazole 500-20 MG TBEC     Follow-up: Return in about 4 weeks (around 07/17/2016) for with Dr Renne Crigler, follow up diabetes.   Crecencio Mc, MD

## 2016-06-19 NOTE — Patient Instructions (Addendum)
You are supposed to be taking NPH insulin twce daily per Dr Renne Crigler,  Along with your Novolog    I sent the NPH insulin to [facility].  20 units in the morning,  55 units in the evening before dinner  Continue Novolog three times daily before meals   You have a skin infection I am treating with Septra DS to take twice daily for 7 days  I have refilled the valcyclovir for use when you have a herpes outbreak   I will set up your mammogram.  I am prescribing meloxicam once daily for your knee pain.  You can combinre with tylenbol but not with motrin or aleve

## 2016-06-21 DIAGNOSIS — R350 Frequency of micturition: Secondary | ICD-10-CM | POA: Insufficient documentation

## 2016-06-21 DIAGNOSIS — Z0000 Encounter for general adult medical examination without abnormal findings: Secondary | ICD-10-CM | POA: Insufficient documentation

## 2016-06-21 NOTE — Assessment & Plan Note (Signed)
Now managed by Dr Renne Crigler with continue lack of control due ot patient noncompliance.  Has not been using NPH at all.  Med sent to pharmacy again and verbal instructions given.

## 2016-06-21 NOTE — Assessment & Plan Note (Signed)
Her diabetes is uncontrolled.  She has not been using NPH twice daily , which was prescribed by Dr. Renne Crigler in May,  Instructions written out and explained verbally to patient and medication sent  to pharmacy.  She acknowledges her noncompliance but has no real incentive to change despite experiencing urinary frequency due to glucosuria and being offered hospital  admission recently by NP

## 2016-06-21 NOTE — Assessment & Plan Note (Signed)
Secondary to glucosuria.  No signs of UTI by POCT.  Improved adherence to diabetes regemen advised.

## 2016-06-21 NOTE — Assessment & Plan Note (Signed)
She has a current falre involving left groin and right breast.  Septra DS prescribed

## 2016-06-21 NOTE — Assessment & Plan Note (Signed)
Annual comprehensive preventive exam was done as well as an evaluation and management of chronic conditions .  During the course of the visit the patient was educated and counseled about appropriate screening and preventive services including :  diabetes screening, lipid analysis with projected  10 year  risk for CAD , nutrition counseling, breast, cervical and colorectal cancer screening, and recommended immunizations.  Printed recommendations for health maintenance screenings was given 

## 2016-06-22 ENCOUNTER — Other Ambulatory Visit: Payer: Self-pay | Admitting: Internal Medicine

## 2016-06-22 NOTE — Assessment & Plan Note (Addendum)
Patient may be having a re-exacerbation. Today we decided to try an injection. Patient on her the procedure well with near complete resolution of pain. We did discuss formal physical therapy which patient declined secondary to financial limitations. We discussed icing regimen. Discussed which activities doing which was potentially avoid. We discussed bracing which patient also declined. Patient given a trial of topical anti-inflammatories. Patient declined repeat x-rays at this time. Patient will come back again in 4-6 weeks and see how she is responding.Spent  25 minutes with patient face-to-face and had greater than 50% of counseling including as described above in assessment and plan.

## 2016-06-22 NOTE — Progress Notes (Signed)
[name] [address] [place], [place] [address] Phone: [phone] Subjective:      CC: Left knee pain follow up  [id number]  [name] is a 55 y.o. female coming in with complaint of left knee pain. Had more of a chronic meniscal tear. Patient elected try conservative therapy including home exercises, icing, topical anti-inflammatories, and some mild bracing.patient also had an injection at last follow-up.this was back in October 2016. Patient states that this started giving him difficulty and again over the course last 3 months. Patient states that for the first 6 months with films pain-free. Patient states that she gets cramping in the legs bilaterally as well. Patient denies any locking or giving out on her but states that the pain can be severe enough that she does not want to walk and does change her daily activities. Patient states even at night she has to sleep with a pillow between her legs otherwise she has discomfort.  She did have x-rays previously. These were reviewed by me. Patient's x-rays show no significant bony abnormality. No early signs of arthritis.      Past Medical History:  Diagnosis Date  . Arthritis   . Campylobacter enteritis May 2012   [facility] admission  . Carpal tunnel syndrome, bilateral   . Diabetes mellitus   . Gastritis with bleeding due to alcohol 2010   [facility] admission  . GERD (gastroesophageal reflux disease)   . Hyperlipidemia   . Hypertension    no meds  . Wears glasses    Past Surgical History:  Procedure Laterality Date  . CARPAL TUNNEL RELEASE Left 05/14/2014   Procedure: LEFT ULNAR NEUROPLASTY AT ELBOW AND ENDOSCOPIC CARPAL TUNNEL RELEASE;  Surgeon: Jolyn Nap, MD;  Location: [facility];  Service: Orthopedics;  Laterality: Left;  . CYST EXCISION  1995   under arms  . endometrial biopsy  2004   benign  . FOOT OSTEOTOMY  2010   right-spurs  . TONSILLECTOMY    . ULNAR NERVE  TRANSPOSITION Left 05/14/2014   Procedure: ULNAR NERVE DECOMPRESSION/TRANSPOSITION;  Surgeon: Jolyn Nap, MD;  Location: [facility];  Service: Orthopedics;  Laterality: Left;   Social History  Substance Use Topics  . Smoking status: Former Smoker    Quit date: 05/09/2012  . Smokeless tobacco: Never Used  . Alcohol use No     Comment: not for 7 yr   Allergies  Allergen Reactions  . Oxycodone-Acetaminophen     REACTION: Unknown reaction  . Penicillins Rash   Family History  Problem Relation Age of Onset  . Hypertension Mother   . Stroke Father   . Diabetes Maternal Grandmother   . Hypertension Maternal Grandmother      Past medical history, social, surgical and family history all reviewed in electronic medical record.   Review of Systems: No headache, visual changes, nausea, vomiting, diarrhea, constipation, dizziness, abdominal pain, skin rash, fevers, chills, night sweats, weight loss, swollen lymph nodes, body aches, joint swelling, muscle aches, chest pain, shortness of breath, mood changes.   Objective  Blood pressure 120/70, pulse 92, weight 176 lb (79.8 kg), SpO2 98 %.  General: No apparent distress alert and oriented x3 mood and affect normal, dressed appropriately. Lots of energy HEENT: Pupils equal, extraocular movements intact  Respiratory: Patient's speak in full sentences and does not appear short of breath  Cardiovascular: No lower extremity edema, non tender, no erythema  Skin: Warm dry intact with  no signs of infection or rash on extremities or on axial skeleton.  Abdomen: Soft nontender  Neuro: Cranial nerves II through XII are intact, neurovascularly intact in all extremities with 2+ DTRs and 2+ pulses.  Lymph: No lymphadenopathy of posterior or anterior cervical chain or axillae bilaterally.  Gait normal with good balance and coordination.  MSK:  Non tender with full range of motion and good stability and symmetric strength and tone of  shoulders, elbows, wrist, hip, and ankles bilaterally.  Knee: Left Normal to inspection with no erythema or effusion or obvious bony abnormalities. Continued mild atrophy Continued Tender over the medial joint line, more than previous exam ROM full in flexion and extension and lower leg rotation. Ligaments with solid consistent endpoints including ACL, PCL, LCL, MCL. negativeMcmurray's, Apley's, and Thessalonian tests. painful patellar compression. Patellar glide with mildcrepitus. Patellar and quadriceps tendons unremarkable. Hamstring and quadriceps strength is normal.  Mild worsening from previous exam Contralateral knee unremarkable  Procedure note After informed written and verbal consent, patient was seated on exam table. Left knee was prepped with alcohol swab and utilizing anterolateral approach, patient's left knee space was injected with 4:1  marcaine 0.5%: Kenalog 40mg /dL. Patient tolerated the procedure well without immediate complications.     Impression and Recommendations:     This case required medical decision making of moderate complexity.

## 2016-06-23 ENCOUNTER — Other Ambulatory Visit: Payer: Self-pay

## 2016-06-23 ENCOUNTER — Ambulatory Visit (INDEPENDENT_AMBULATORY_CARE_PROVIDER_SITE_OTHER): Payer: BC Managed Care – PPO | Admitting: Family Medicine

## 2016-06-23 ENCOUNTER — Encounter: Payer: Self-pay | Admitting: Family Medicine

## 2016-06-23 DIAGNOSIS — M23204 Derangement of unspecified medial meniscus due to old tear or injury, left knee: Secondary | ICD-10-CM | POA: Diagnosis not present

## 2016-06-23 DIAGNOSIS — M23207 Derangement of unspecified meniscus due to old tear or injury, left knee: Secondary | ICD-10-CM | POA: Diagnosis not present

## 2016-06-23 DIAGNOSIS — M23201 Derangement of unspecified lateral meniscus due to old tear or injury, left knee: Secondary | ICD-10-CM

## 2016-06-23 LAB — CYTOLOGY - PAP

## 2016-06-23 MED ORDER — INSULIN NPH (HUMAN) (ISOPHANE) 100 UNIT/ML ~~LOC~~ SUSP: SUBCUTANEOUS | 3 refills | Status: DC

## 2016-06-23 NOTE — Patient Instructions (Signed)
Good to see you  We injected the knee again Lets try to do the exercises again 3 times a week.  Good shoes with rigid bottom.  [name], [place] or New balance greater then 700 Ice 20 minutes 2 times daily. Usually after activity and before bed. I think the cramping is from iron being low.  Take iron 65mg  daily with 500mg  of vitamin C.  See me again in 6 weeks to make sure you are better

## 2016-06-23 NOTE — Progress Notes (Signed)
Pre visit review using our clinic review tool, if applicable. No additional management support is needed unless otherwise documented below in the visit note. 

## 2016-07-07 ENCOUNTER — Encounter: Payer: Self-pay | Admitting: Internal Medicine

## 2016-07-07 ENCOUNTER — Ambulatory Visit (INDEPENDENT_AMBULATORY_CARE_PROVIDER_SITE_OTHER): Payer: BC Managed Care – PPO | Admitting: Internal Medicine

## 2016-07-07 VITALS — BP 118/74 | HR 90 | Ht 64.0 in | Wt 180.0 lb

## 2016-07-07 DIAGNOSIS — Z794 Long term (current) use of insulin: Secondary | ICD-10-CM

## 2016-07-07 DIAGNOSIS — IMO0002 Reserved for concepts with insufficient information to code with codable children: Secondary | ICD-10-CM | POA: Insufficient documentation

## 2016-07-07 DIAGNOSIS — E11319 Type 2 diabetes mellitus with unspecified diabetic retinopathy without macular edema: Secondary | ICD-10-CM | POA: Diagnosis not present

## 2016-07-07 DIAGNOSIS — E1165 Type 2 diabetes mellitus with hyperglycemia: Secondary | ICD-10-CM | POA: Diagnosis not present

## 2016-07-07 DIAGNOSIS — E119 Type 2 diabetes mellitus without complications: Secondary | ICD-10-CM | POA: Insufficient documentation

## 2016-07-07 LAB — POCT GLYCOSYLATED HEMOGLOBIN (HGB A1C): HEMOGLOBIN A1C: 10.4

## 2016-07-07 NOTE — Progress Notes (Signed)
Patient ID: [name], female   DOB: March 27, 1961, 55 y.o.   MRN: [id number]  HPI: [name] is a 55 y.o.-year-old female, returning for f/u for DM2, dx ~2000, insulin-dependent since 2010, uncontrolled, with complications (PN, + DR). Last visit 3 mo ago.  She is usually not compliant with her DM regimen. She does not take it as prescribed and can run out of insulin. Today, she comes back w/o a log or meter. She again has a lot of excuses regarding why she is not taking care of herself.   She tells me she was only taking the R insulin since last visit.... Only restarted N insulin 2-3 weeks ago...  Last hemoglobin A1c was: Lab Results  Component Value Date   HGBA1C 10.4 04/07/2016   HGBA1C 9.3 01/14/2016   HGBA1C 10.9 (H) 09/25/2015  Had cortisone inj this month.  Pt was on a regimen of: - Metformin 1000 mg po bid  - Novolin 70/30  35 in am and 65 in pm - can miss doses, but less than before We stopped Glipizide 10 mg bid.  We had to switch to: - Metformin 1000 mg po bid Insulin Before breakfast Before lunch Before dinner  Regular  15 15 35  NPH 20 - 55   Pt was checking her sugars 2-3x a day - not so much recently >> no log and no meter. Reviewed sugars from before: - am:  93-295 >> 76-183, 249 >> 120-239 >> 200s, 300 today >> 194-250 >> 200s (misses doses)  - 2h after b'fast: 120-195 >> 154 >> n/c > 203 >> n/c - lunch: n/c >> 85, 148, 445 >> 269  >> n/c >> n/c  - mid-afternoon: 106-196, 290 >> 118, 208 >> 180 >> 40x1, 78-159, 167 >> n/c - before dinner: 75-126, 189, 235 >> 36 x1, 46 x1, 81-156, 242 >> 60, 98-230, 255 >> n/c - 2h after dinner: 72, 114-292 >> n/c >> 216, 219, 451 >> n/c - bedtime: 92-266 >> 84-214, 350 >> 75-288 >> 66, 90-241, 338 >> 118-228 >> 158-255, 296 >> n/c >> up to 300 - nighttime: 145-239, 338 >> n/c Lowest sugar was 36 x1 >> 60 >> ?; she has hypoglycemia awareness at 80. Highest sugar was 408 >> 300s  - no CKD, last BUN/creatinine:  Lab Results   Component Value Date   BUN 16 06/19/2016   CREATININE 0.63 06/19/2016  Last ACR 0.5 06/19/13. Not on ACEI/ARB.  - last set of lipids: Lab Results  Component Value Date   CHOL 124 (L) 06/19/2016   HDL 44 (L) 06/19/2016   LDLCALC 38 06/19/2016   LDLDIRECT 94.0 09/25/2015   TRIG 211 (H) 06/19/2016   CHOLHDL 2.8 06/19/2016  She is on Lipitor.   - last eye exam was in 03/2015. + DR L>R. + glaucoma Dr Arnoldo Morale. Did not schedule a new appt despite multiple promptings. - + numbness and tingling in her feet. Seeing her podiatrist.  She also has a history of hypertension, hyperlipidemia-on Lipitor, episodic alcohol abuse, GERD, low back pain, osteoarthritis.   ROS: Constitutional: no weight loss, no fatigue, no nocturia Eyes: nol blurry vision, no xerophthalmia ENT: no sore throat, no nodules palpated in throat, no dysphagia/odynophagia, no hoarseness Cardiovascular: no CP/SOB/palpitations/leg swelling Respiratory: no cough/SOBswelling Gastrointestinal: no N/V/D/C Musculoskeletal: + muscle/+ joint aches  Skin: no rashes Neurological: no tremors/numbness/tingling/dizziness  I reviewed pt's medications, allergies, PMH, social hx, family hx, and changes were documented in the history of present illness. Otherwise,  unchanged from my initial visit note.  PE: BP 118/74 (BP Location: Left Arm, Patient Position: Sitting)   Pulse 90   Ht 5\' 4"  (1.626 m)   Wt 180 lb (81.6 kg)   SpO2 96%   BMI 30.90 kg/m  Body mass index is 30.9 kg/m. Wt Readings from Last 3 Encounters:  07/07/16 180 lb (81.6 kg)  06/19/16 174 lb (78.9 kg)  06/05/16 174 lb (78.9 kg)   Constitutional: overweight, in NAD Eyes: PERRLA, EOMI, no exophthalmos ENT: moist mucous membranes, no thyromegaly, no cervical lymphadenopathy Cardiovascular: RRR, No MRG Respiratory: CTA B Gastrointestinal: abdomen soft, NT, ND, BS+ Musculoskeletal: no deformities, strength intact in all 4 Skin: moist, warm Neurological: no tremor  with outstretched hands, DTR normal in all 4  ASSESSMENT: 1. DM2, insulin-dependent, uncontrolled, with complications - peripheral neuropathy - DR   PLAN:  1. Patient with long-standing,uncontrolled diabetes, now on basal-bolus insulin regimen with ReliOn N and R insulin (cheapest insulin available, limited finances). However, she did not use "the cloudy insulin" until 2-3 weeks ago (unclear why!) - she is not checking frequently >> cannot make further changes... Again advised compliance before we can adjust her insulin doses and gain control of her diabetes. - reviewed last hba1c >> 10.4% (high) - I advised her to continue:  Patient Instructions  Please continue: - Metformin 1000 mg 2x a day   Insulin Before breakfast Before lunch Before dinner  Regular >> Novolog 15 15 35  NPH 20 - 55   Please schedule anew eye exam.  Please come back for a follow-up appointment in 3 months with your log.  - continue checking her sugars at different times of the day - check 3 times a day, rotating checks  - she needs a new eye exam!  - HbA1c today >> 10.4% (same, high) - Return to clinic in 3 mo with sugar log - given a new one

## 2016-07-07 NOTE — Addendum Note (Signed)
Addended by: Caprice Beaver T on: 07/07/2016 03:56 PM   Modules accepted: Orders

## 2016-07-07 NOTE — Patient Instructions (Addendum)
Please continue: - Metformin 1000 mg 2x a day   Insulin Before breakfast Before lunch Before dinner  Regular  15 15 35  NPH 20 - 55   Please schedule a new eye exam.  Please come back for a follow-up appointment in 3 months with your log.

## 2016-07-18 ENCOUNTER — Other Ambulatory Visit: Payer: Self-pay | Admitting: Internal Medicine

## 2016-07-22 ENCOUNTER — Ambulatory Visit (INDEPENDENT_AMBULATORY_CARE_PROVIDER_SITE_OTHER): Payer: BC Managed Care – PPO | Admitting: Internal Medicine

## 2016-07-22 ENCOUNTER — Encounter: Payer: Self-pay | Admitting: Internal Medicine

## 2016-07-22 DIAGNOSIS — Z23 Encounter for immunization: Secondary | ICD-10-CM

## 2016-07-22 DIAGNOSIS — E1165 Type 2 diabetes mellitus with hyperglycemia: Secondary | ICD-10-CM

## 2016-07-22 DIAGNOSIS — K2100 Gastro-esophageal reflux disease with esophagitis, without bleeding: Secondary | ICD-10-CM

## 2016-07-22 DIAGNOSIS — F4321 Adjustment disorder with depressed mood: Secondary | ICD-10-CM

## 2016-07-22 DIAGNOSIS — E113219 Type 2 diabetes mellitus with mild nonproliferative diabetic retinopathy with macular edema, unspecified eye: Secondary | ICD-10-CM | POA: Diagnosis not present

## 2016-07-22 DIAGNOSIS — IMO0002 Reserved for concepts with insufficient information to code with codable children: Secondary | ICD-10-CM

## 2016-07-22 DIAGNOSIS — Z794 Long term (current) use of insulin: Secondary | ICD-10-CM

## 2016-07-22 MED ORDER — INSULIN NPH (HUMAN) (ISOPHANE) 100 UNIT/ML ~~LOC~~ SUSP: SUBCUTANEOUS | 3 refills | Status: DC

## 2016-07-22 MED ORDER — INSULIN ASPART 100 UNIT/ML ~~LOC~~ SOLN: SUBCUTANEOUS | 2 refills | Status: DC

## 2016-07-22 MED ORDER — ATORVASTATIN CALCIUM 40 MG PO TABS: ORAL_TABLET | ORAL | 5 refills | Status: DC

## 2016-07-22 MED ORDER — OMEPRAZOLE 40 MG PO CPDR: 40.0000 mg | DELAYED_RELEASE_CAPSULE | Freq: Every day | ORAL | 5 refills | Status: DC

## 2016-07-22 NOTE — Patient Instructions (Addendum)
Mylanta Gas ,  Gas X and Phaszyme for relief of bloating and gas   Try taking Beano before  Any meals containing  Vegetables. To prevent gas   Follow up with Dr. Renne Crigler in November   I have refilled the insulin ,  Omeprazole for your stomach,  And atorvastatin for your cholesterol

## 2016-07-22 NOTE — Progress Notes (Signed)
Subjective:  Patient ID: [name], female    DOB: 11-20-1961  Age: 55 y.o. MRN: [id number]  CC: Diagnoses of Encounter for immunization, Uncontrolled type 2 diabetes mellitus with mild nonproliferative retinopathy and macular edema, with long-term current use of insulin ([place]), Grief reaction, and Gastroesophageal reflux disease with esophagitis were pertinent to this visit.  HPI [name] presents for follow up on uncontrolled DM.  Seen one month ago.  Referred to Dr Renne Crigler.  She is taking NPH twice daily, 20 in the am and 55 in the PM  and 15, 15 and  35 units of short acting insulin before bfast lunch and dinner  Per Dr. Renne Crigler . She  Had a hypoglycemic event after taking her  R insulin at mealtime and ate nothing but jello.  Has been noticing a change in her bowel habits,  Not seeing any blood or diarrhea,  Just some fecal staining  And increased gas>  She has admittedly changed her diet since her vsit with endocrinology  Feeling bloated A LOT AFTER eating. Denies nausea,  Having increased gas       Lab Results  Component Value Date   HGBA1C 10.4 07/07/2016     Outpatient Medications Prior to Visit  Medication Sig Dispense Refill  . gabapentin (NEURONTIN) 300 MG capsule     . glucose blood (BAYER CONTOUR TEST) test strip TEST BLOOD SUGARS 3 TIMES DAILY 200 each 11  . Lanc[employer] (EASY TOUCH LANCING DEVICE) MISC Use to check diabetes 4 times daily  E 11.65 1 each 11  . metFORMIN (GLUCOPHAGE) 1000 MG tablet Take 1 tablet (1,000 mg total) by mouth 2 (two) times daily with a meal. 60 tablet 5  . nystatin (MYCOSTATIN/NYSTOP) 100000 UNIT/GM POWD Apply powder to affected area twice daily until rash has resolved 15 g 3  . valACYclovir (VALTREX) 500 MG tablet Take 1 tablet (500 mg total) by mouth 3 (three) times daily. Reported on 06/19/2016 21 tablet 3  . atorvastatin (LIPITOR) 40 MG tablet TAKE ONE TABLET BY MOUTH ONCE DAILY **PATIENT NEEDS APPOINTMENT FOR FURTHER REFILLS**  30 tablet 5  . insulin aspart (NOVOLOG) 100 UNIT/ML injection Inject into the skin 15 minutes before breakfast, 15 units before lunch and 35 units before dinner (Patient taking differently: Inject into the skin 15 units before breakfast, 15 units before lunch and 35 units before dinner) 20 mL 2  . insulin NPH Human (NOVOLIN N RELION) 100 UNIT/ML injection Inject 75 units under skin as advised 30 mL 3  . lansoprazole (PREVACID) 30 MG capsule Take 1 capsule (30 mg total) by mouth daily at 12 noon. 30 capsule 2  . omeprazole (PRILOSEC) 40 MG capsule TAKE ONE CAPSULE BY MOUTH ONCE DAILY 30 capsule 3  . lidocaine (LIDODERM) 5 % Reported on 06/19/2016    . terbinafine (LAMISIL) 250 MG tablet Reported on 06/19/2016    . HYDROcodone-acetaminophen (NORCO) 10-325 MG per tablet Take 1 tablet by mouth at bedtime as needed and may repeat dose one time if needed for severe pain. (Patient not taking: Reported on 07/22/2016) 60 tablet 0  . insulin NPH Human (HUMULIN N) 100 UNIT/ML injection Inject 20 units in am and 55 units before dinner (Patient not taking: Reported on 07/22/2016) 60 mL 1  . meloxicam (MOBIC) 15 MG tablet Take 1 tablet (15 mg total) by mouth daily. (Patient not taking: Reported on 07/22/2016) 30 tablet 0  . sulfamethoxazole-trimethoprim (BACTRIM DS,SEPTRA DS) 800-160 MG tablet Take 1 tablet by  mouth 2 (two) times daily. (Patient not taking: Reported on 07/22/2016) 14 tablet 0   No facility-administered medications prior to visit.     Review of Systems;  Patient denies headache, fevers, malaise, unintentional weight loss, skin rash, eye pain, sinus congestion and sinus pain, sore throat, dysphagia,  hemoptysis , cough, dyspnea, wheezing, chest pain, palpitations, orthopnea, edema, abdominal pain, nausea, melena, diarrhea, constipation, flank pain, dysuria, hematuria, urinary  Frequency, nocturia, numbness, tingling, seizures,  Focal weakness, Loss of consciousness,  Tremor, insomnia, depression,  anxiety, and suicidal ideation.      Objective:  BP 132/82   Pulse (!) 103   Temp 99 F (37.2 C) (Oral)   Resp 12   Ht 5\' 4"  (1.626 m)   Wt 183 lb (83 kg)   SpO2 97%   BMI 31.41 kg/m   BP Readings from Last 3 Encounters:  07/22/16 132/82  07/07/16 118/74  06/19/16 122/84    Wt Readings from Last 3 Encounters:  07/22/16 183 lb (83 kg)  07/07/16 180 lb (81.6 kg)  06/19/16 174 lb (78.9 kg)    General appearance: alert, cooperative and appears stated age Ears: normal TM's and external ear canals both ears Throat: lips, mucosa, and tongue normal; teeth and gums normal Neck: no adenopathy, no carotid bruit, supple, symmetrical, trachea midline and thyroid not enlarged, symmetric, no tenderness/mass/nodules Back: symmetric, no curvature. ROM normal. No CVA tenderness. Lungs: clear to auscultation bilaterally Heart: regular rate and rhythm, S1, S2 normal, no murmur, click, rub or gallop Abdomen: soft, non-tender; bowel sounds normal; no masses,  no organomegaly Pulses: 2+ and symmetric Skin: Skin color, texture, turgor normal. No rashes or lesions Lymph nodes: Cervical, supraclavicular, and axillary nodes normal.  Lab Results  Component Value Date   HGBA1C 10.4 07/07/2016   HGBA1C 10.4 04/07/2016   HGBA1C 9.3 01/14/2016    Lab Results  Component Value Date   CREATININE 0.63 06/19/2016   CREATININE 0.73 06/05/2016   CREATININE 0.58 09/25/2015    Lab Results  Component Value Date   HGB 10.5 (L) 05/14/2014   GLUCOSE 236 (H) 06/19/2016   CHOL 124 (L) 06/19/2016   TRIG 211 (H) 06/19/2016   HDL 44 (L) 06/19/2016   LDLDIRECT 94.0 09/25/2015   LDLCALC 38 06/19/2016   ALT 14 06/19/2016   AST 12 06/19/2016   NA 138 06/19/2016   K 3.9 06/19/2016   CL 102 06/19/2016   CREATININE 0.63 06/19/2016   BUN 16 06/19/2016   CO2 20 06/19/2016   TSH 1.07 10/04/2014   HGBA1C 10.4 07/07/2016   MICROALBUR 1.9 06/19/2016    No results found.  Assessment & Plan:   Problem  List Items Addressed This Visit    GERD    Continue PPI,  History of GI bleed.  Avoiding NSAIDs      Relevant Medications   omeprazole (PRILOSEC) 40 MG capsule   Uncontrolled type 2 diabetes mellitus with retinopathy, with long-term current use of insulin ([facility])    Her diabetes is still uncontrolled but now managed by Dr. Renne Crigler with  use of regular insulin at each meal and NPH twice daily ,  She  has neuropathy, and is modifying her diet .  Continue statin ,  Avoid Aspirid due to history rof gastric ulcer ane need ot use NSAIDs for other issues   Lab Results  Component Value Date   HGBA1C 10.4 07/07/2016         Relevant Medications   atorvastatin (LIPITOR) 40 MG tablet  insulin aspart (NOVOLOG) 100 UNIT/ML injection   insulin NPH Human (NOVOLIN N RELION) 100 UNIT/ML injection   Grief reaction    Her mother passed away yesterday morning with Hospice care at home. She  has adequate coping skills and emotional support .  i have asked patinet to return in one month to examine for signs of unresolving grief.        Other Visit Diagnoses    Encounter for immunization       Relevant Orders   Flu Vaccine QUAD 36+ mos IM (Completed)      I have discontinued Ms. Maietta's lansoprazole, HYDROcodone-acetaminophen, insulin NPH Human, sulfamethoxazole-trimethoprim, and meloxicam. I have also changed her atorvastatin, omeprazole, insulin aspart, and insulin NPH Human. Additionally, I am having her maintain her gabapentin, EASY TOUCH LANCING DEVICE, nystatin, glucose blood, metFORMIN, lidocaine, terbinafine, and valACYclovir.  Meds ordered this encounter  Medications  . atorvastatin (LIPITOR) 40 MG tablet    Sig: TAKE ONE TABLET BY MOUTH ONCE DAILY    Dispense:  30 tablet    Refill:  5  . omeprazole (PRILOSEC) 40 MG capsule    Sig: Take 1 capsule (40 mg total) by mouth daily.    Dispense:  30 capsule    Refill:  5  . insulin aspart (NOVOLOG) 100 UNIT/ML injection    Sig: Inject into  the skin 15 units before breakfast, 15 units before lunch and 35 units before dinner    Dispense:  20 mL    Refill:  2  . insulin NPH Human (NOVOLIN N RELION) 100 UNIT/ML injection    Sig: Inject 20 units under skin in the AM and 55 units in the evening    Dispense:  30 mL    Refill:  3    Medications Discontinued During This Encounter  Medication Reason  . lansoprazole (PREVACID) 30 MG capsule   . atorvastatin (LIPITOR) 40 MG tablet Reorder  . omeprazole (PRILOSEC) 40 MG capsule Reorder  . insulin aspart (NOVOLOG) 100 UNIT/ML injection Reorder  . insulin NPH Human (HUMULIN N) 100 UNIT/ML injection   . insulin NPH Human (NOVOLIN N RELION) 100 UNIT/ML injection Reorder  . sulfamethoxazole-trimethoprim (BACTRIM DS,SEPTRA DS) 800-160 MG tablet   . meloxicam (MOBIC) 15 MG tablet   . HYDROcodone-acetaminophen (NORCO) 10-325 MG per tablet     Follow-up: Return in about 6 months (around 01/22/2017).   Crecencio Mc, MD

## 2016-07-22 NOTE — Progress Notes (Signed)
Pre-visit discussion using our clinic review tool. No additional management support is needed unless otherwise documented below in the visit note.  

## 2016-07-24 DIAGNOSIS — F4321 Adjustment disorder with depressed mood: Secondary | ICD-10-CM | POA: Insufficient documentation

## 2016-07-24 NOTE — Assessment & Plan Note (Addendum)
Her diabetes is still uncontrolled but now managed by Dr. Renne Crigler with  use of regular insulin at each meal and NPH twice daily ,  She  has neuropathy, and is modifying her diet .  Continue statin ,  Avoid Aspirid due to history rof gastric ulcer ane need ot use NSAIDs for other issues   Lab Results  Component Value Date   HGBA1C 10.4 07/07/2016

## 2016-07-24 NOTE — Assessment & Plan Note (Signed)
Continue PPI,  History of GI bleed.  Avoiding NSAIDs

## 2016-07-24 NOTE — Assessment & Plan Note (Signed)
Her mother passed away yesterday morning with Hospice care at home. She  has adequate coping skills and emotional support .  i have asked patinet to return in one month to examine for signs of unresolving grief.

## 2016-08-10 NOTE — Progress Notes (Signed)
[name] [address] [place], [place] [address] Phone: [phone] Subjective:      CC: Left knee pain follow up  [id number]  [name] is a 55 y.o. female coming in with complaint of left knee pain. Had more of a chronic meniscal tear. Patient was doing conservative therapy and was given an injection 6 weeks ago. Patient states States that she is a proximal a 6070% better. No locking or giving away on her. Patient states that overall she seems to be doing very well. Able to do daily activities without any pain. Only hurts her when she went up or down stairs repetitively.  She did have x-rays previously. These were reviewed by me. Patient's x-rays show no significant bony abnormality. No early signs of arthritis.      Past Medical History:  Diagnosis Date  . Arthritis   . Campylobacter enteritis May 2012   [facility] admission  . Carpal tunnel syndrome, bilateral   . Diabetes mellitus   . Gastritis with bleeding due to alcohol 2010   [facility] admission  . GERD (gastroesophageal reflux disease)   . Hyperlipidemia   . Hypertension    no meds  . Wears glasses    Past Surgical History:  Procedure Laterality Date  . CARPAL TUNNEL RELEASE Left 05/14/2014   Procedure: LEFT ULNAR NEUROPLASTY AT ELBOW AND ENDOSCOPIC CARPAL TUNNEL RELEASE;  Surgeon: Jolyn Nap, MD;  Location: [facility];  Service: Orthopedics;  Laterality: Left;  . CYST EXCISION  1995   under arms  . endometrial biopsy  2004   benign  . FOOT OSTEOTOMY  2010   right-spurs  . TONSILLECTOMY    . ULNAR NERVE TRANSPOSITION Left 05/14/2014   Procedure: ULNAR NERVE DECOMPRESSION/TRANSPOSITION;  Surgeon: Jolyn Nap, MD;  Location: [facility];  Service: Orthopedics;  Laterality: Left;   Social History  Substance Use Topics  . Smoking status: Former Smoker    Quit date: 05/09/2012  . Smokeless tobacco: Never Used  . Alcohol use No     Comment: not  for 7 yr   Allergies  Allergen Reactions  . Oxycodone-Acetaminophen     REACTION: Unknown reaction  . Penicillins Rash   Family History  Problem Relation Age of Onset  . Hypertension Mother   . Stroke Father   . Diabetes Maternal Grandmother   . Hypertension Maternal Grandmother      Past medical history, social, surgical and family history all reviewed in electronic medical record.   Review of Systems: No headache, visual changes, nausea, vomiting, diarrhea, constipation, dizziness, abdominal pain, skin rash, fevers, chills, night sweats, weight loss, swollen lymph nodes, body aches, joint swelling, muscle aches, chest pain, shortness of breath, mood changes.   Objective  There were no vitals taken for this visit.  General: No apparent distress alert and oriented x3 mood and affect normal, dressed appropriately. Lots of energy HEENT: Pupils equal, extraocular movements intact  Respiratory: Patient's speak in full sentences and does not appear short of breath  Cardiovascular: No lower extremity edema, non tender, no erythema  Skin: Warm dry intact with no signs of infection or rash on extremities or on axial skeleton.  Abdomen: Soft nontender  Neuro: Cranial nerves II through XII are intact, neurovascularly intact in all extremities with 2+ DTRs and 2+ pulses.  Lymph: No lymphadenopathy of posterior or anterior cervical chain or axillae bilaterally.  Gait normal with good balance and coordination.  MSK:  Non tender with full range of motion and good stability and symmetric strength and tone of shoulders, elbows, wrist, hip, and ankles bilaterally.  Knee: Left Normal to inspection with no erythema or effusion or obvious bony abnormalities. Continued mild atrophy Nontender on exam. ROM full in flexion and extension and lower leg rotation. Ligaments with solid consistent endpoints including ACL, PCL, LCL, MCL. negativeMcmurray's, Apley's, and Thessalonian tests. painful patellar  compression. Patellar glide with minimal crepitus. Patellar and quadriceps tendons unremarkable. Hamstring and quadriceps strength is normal.  Mild worsening from previous exam Contralateral knee unremarkable     Impression and Recommendations:     This case required medical decision making of moderate complexity.

## 2016-08-11 ENCOUNTER — Ambulatory Visit (INDEPENDENT_AMBULATORY_CARE_PROVIDER_SITE_OTHER): Payer: BC Managed Care – PPO | Admitting: Family Medicine

## 2016-08-11 ENCOUNTER — Encounter: Payer: Self-pay | Admitting: Family Medicine

## 2016-08-11 DIAGNOSIS — M23204 Derangement of unspecified medial meniscus due to old tear or injury, left knee: Secondary | ICD-10-CM | POA: Diagnosis not present

## 2016-08-11 DIAGNOSIS — M23201 Derangement of unspecified lateral meniscus due to old tear or injury, left knee: Secondary | ICD-10-CM

## 2016-08-11 DIAGNOSIS — M23207 Derangement of unspecified meniscus due to old tear or injury, left knee: Secondary | ICD-10-CM

## 2016-08-11 NOTE — Assessment & Plan Note (Signed)
Responded well to conservative therapy. We discussed icing regimen and home exercises. We discussed objective is to do an which was potentially avoid. Patient will come back and see me again on an as-needed basis.

## 2016-08-11 NOTE — Patient Instructions (Signed)
Good to see you  Ice 20 minutes 2 times daily. Usually after activity and before bed. Exercises 2-3 times a week.  You are doing great and keep it up See me when you need me.

## 2016-08-12 ENCOUNTER — Other Ambulatory Visit: Payer: Self-pay

## 2016-08-12 MED ORDER — GLUCOSE BLOOD VI STRP: ORAL_STRIP | 11 refills | Status: DC

## 2016-08-12 MED ORDER — EASY TOUCH LANCING DEVICE MISC: 11 refills | Status: DC

## 2016-08-31 ENCOUNTER — Telehealth: Payer: Self-pay

## 2016-08-31 NOTE — Telephone Encounter (Signed)
Called patient and advised of changes in insulin. Patient acknowledged that she would call back on Friday with her sugars.

## 2016-08-31 NOTE — Telephone Encounter (Signed)
OK, Let's change the regimen as follows:  Insulin Before breakfast Before lunch Before dinner  Novolog 15 15 35 >> 30  NPH 20 - 55 >> 45    Continue Metformin 1000 mg 2x a day   Please call with sugars again in few days.

## 2016-08-31 NOTE — Telephone Encounter (Signed)
Patient called and spoke with me, states that since we changed her insulin her sugars have been really low. She states at night she will wake up in sweats, she states the lowest it has been is in the 40's, but normally it has been 140's-150's. Patient also states the highest it has been is 293, Patient asking if she needs to change anything with the insulin.

## 2016-09-19 ENCOUNTER — Other Ambulatory Visit: Payer: Self-pay | Admitting: Internal Medicine

## 2016-10-02 ENCOUNTER — Other Ambulatory Visit: Payer: Self-pay | Admitting: Internal Medicine

## 2016-10-08 ENCOUNTER — Ambulatory Visit (INDEPENDENT_AMBULATORY_CARE_PROVIDER_SITE_OTHER): Payer: BC Managed Care – PPO | Admitting: Internal Medicine

## 2016-10-08 ENCOUNTER — Encounter: Payer: Self-pay | Admitting: Internal Medicine

## 2016-10-08 VITALS — BP 116/84 | HR 94 | Ht 64.0 in | Wt 190.0 lb

## 2016-10-08 DIAGNOSIS — E1165 Type 2 diabetes mellitus with hyperglycemia: Secondary | ICD-10-CM | POA: Diagnosis not present

## 2016-10-08 DIAGNOSIS — E113299 Type 2 diabetes mellitus with mild nonproliferative diabetic retinopathy without macular edema, unspecified eye: Secondary | ICD-10-CM

## 2016-10-08 DIAGNOSIS — Z794 Long term (current) use of insulin: Secondary | ICD-10-CM

## 2016-10-08 LAB — POCT GLYCOSYLATED HEMOGLOBIN (HGB A1C): Hemoglobin A1C: 6.4

## 2016-10-08 MED ORDER — INSULIN NPH (HUMAN) (ISOPHANE) 100 UNIT/ML ~~LOC~~ SUSP: SUBCUTANEOUS | 5 refills | Status: DC

## 2016-10-08 MED ORDER — INSULIN ASPART 100 UNIT/ML ~~LOC~~ SOLN: SUBCUTANEOUS | 2 refills | Status: DC

## 2016-10-08 MED ORDER — GABAPENTIN 300 MG PO CAPS: 300.0000 mg | ORAL_CAPSULE | Freq: Two times a day (BID) | ORAL | 1 refills | Status: DC

## 2016-10-08 NOTE — Progress Notes (Signed)
Patient ID: [name], female   DOB: 06/17/1961, 55 y.o.   MRN: [id number]  HPI: [name] is a 55 y.o.-year-old female, returning for f/u for DM2, dx ~2000, insulin-dependent since 2010, uncontrolled, with complications (PN, + DR). Last visit 3 mo ago.  She had an episode of low CBG (40s)  >> she had confusion >> EMS came.  Last hemoglobin A1c was: Lab Results  Component Value Date   HGBA1C 10.4 07/07/2016   HGBA1C 10.4 04/07/2016   HGBA1C 9.3 01/14/2016  Had cortisone inj this month.  Pt is on: - Metformin 1000 mg po bid Insulin Before breakfast Before lunch Before dinner  Novolog 15 15 35 >> 30  NPH 20 - 55 >> 45   Pt was checking her sugars 2-3x a day - not so much recently >> + log: - am:  76-183, 249 >> 120-239 >> 200s, 300 today >> 194-250 >> 200s (misses doses) >> 64, 100-193 - 2h after b'fast: 120-195 >> 154 >> n/c > 203 >> n/c - lunch: n/c >> 85, 148, 445 >> 269  >> n/c >> 47, 79 (more active at work) - mid-afternoon: 106-196, 290 >> 118, 208 >> 180 >> 40x1, 78-159, 167 >> n/c >> 106-149, 298 - before dinner: 75-126, 189, 235 >> 36 x1, 46 x1, 81-156, 242 >> 60, 98-230, 255 >> n/c >> 46, 229 - 2h after dinner: 72, 114-292 >> n/c >> 216, 219, 451 >> n/c  - bedtime: 75-288 >> 66, 90-241, 338 >> 118-228 >> 158-255, 296 >> n/c >> up to 300 >> 94-164 - nighttime: 145-239, 338 >> n/c Lowest sugar was 36 x1 >> 60 >> 46; she has hypoglycemia awareness at 80. Highest sugar was 408 >> 300s >> 298  - no CKD, last BUN/creatinine:  Lab Results  Component Value Date   BUN 16 06/19/2016   CREATININE 0.63 06/19/2016  Last ACR 0.5 06/19/13. Not on ACEI/ARB.  - last set of lipids: Lab Results  Component Value Date   CHOL 124 (L) 06/19/2016   HDL 44 (L) 06/19/2016   LDLCALC 38 06/19/2016   LDLDIRECT 94.0 09/25/2015   TRIG 211 (H) 06/19/2016   CHOLHDL 2.8 06/19/2016  She is on Lipitor.   - last eye exam was in 03/2015. + DR L>R. + glaucoma Dr Arnoldo Morale. Did not schedule  a new appt despite multiple promptings. - + numbness and tingling in her feet. Seeing her podiatrist. On Neurontin.  She also has a history of hypertension, hyperlipidemia-on Lipitor, episodic alcohol abuse, GERD, low back pain, osteoarthritis.   ROS: Constitutional: no weight loss, no fatigue, no nocturia Eyes: nol blurry vision, no xerophthalmia ENT: no sore throat, no nodules palpated in throat, no dysphagia/odynophagia, no hoarseness Cardiovascular: no CP/SOB/palpitations/leg swelling Respiratory: no cough/SOBswelling Gastrointestinal: no N/V/D/C Musculoskeletal: + muscle/+ joint aches  Skin: no rashes Neurological: no tremors/numbness/tingling/dizziness  I reviewed pt's medications, allergies, PMH, social hx, family hx, and changes were documented in the history of present illness. Otherwise, unchanged from my initial visit note.  PE: Ht 5\' 4"  (1.626 m)   Wt 190 lb (86.2 kg)   BMI 32.61 kg/m  Body mass index is 32.61 kg/m. Wt Readings from Last 3 Encounters:  10/08/16 190 lb (86.2 kg)  08/11/16 187 lb (84.8 kg)  07/22/16 183 lb (83 kg)   Constitutional: overweight, in NAD Eyes: PERRLA, EOMI, no exophthalmos ENT: moist mucous membranes, no thyromegaly, no cervical lymphadenopathy Cardiovascular: RRR, No MRG Respiratory: CTA B Gastrointestinal: abdomen soft, NT,  ND, BS+ Musculoskeletal: no deformities, strength intact in all 4 Skin: moist, warm Neurological: no tremor with outstretched hands, DTR normal in all 4  ASSESSMENT: 1. DM2, insulin-dependent, uncontrolled, with complications - peripheral neuropathy - DR   PLAN:  1. Patient with long-standing,uncontrolled diabetes, now on basal-bolus insulin regimen with ReliOn N and NovoLog instead of R insulin (changed at last visit his NovoLog started to be covered for her again).  - she is now doing a better job with taking her insulin (still missing some doses). Again advised compliance. However, after she started to be  more compliant with her insulin >> she dropped her sugars >> we adjusted the doses 1 mo ago >> still having lows before lunch and dinner. Also, after dinner, sugars are on the low side, but she may skip her evening insulin if this happens >> will reduce insulin with dinner also. - reviewed last hba1c >> 10.4% (stable, high) - I advised her to continue:  Patient Instructions   Please change: Insulin Before breakfast Before lunch Before dinner  Novolog 10 units with a smaller meal 15 units with a larger meal 10 units with a smaller meal 15 units with larger meal 20 units with a smaller meal 25 units with a regular meal 30 units with a larger meal  NPH 15 - 45    Continue Metformin 1000 mg 2x a day   Please schedule a new eye exam.  Please come back for a follow-up appointment in 3 months with your log.  - continue checking her sugars at different times of the day - check 3 times a day, rotating checks  - she needs a new eye exam!  - HbA1c today >> 6.4% (MUCH better) - Return to clinic in 3 mo with sugar log   Philemon Kingdom, MD PhD [facility]

## 2016-10-08 NOTE — Patient Instructions (Addendum)
Please change: Insulin Before breakfast Before lunch Before dinner  Novolog 10 units with a smaller meal 15 units with a larger meal 10 units with a smaller meal 15 units with larger meal 20 units with a smaller meal 25 units with a regular meal 30 units with a larger meal  NPH 15 - 45    Continue Metformin 1000 mg 2x a day   Please schedule a new eye exam.  Please come back for a follow-up appointment in 3 months with your log.

## 2016-10-08 NOTE — Addendum Note (Signed)
Addended by: Verlin Grills T on: 10/08/2016 04:50 PM   Modules accepted: Orders

## 2016-11-08 NOTE — Progress Notes (Signed)
[name] [address], [place] [address] Phone: [phone] Subjective:    CC: Left knee pain follow up  [id number]  [name] is a 55 y.o. female coming in with complaint of left knee pain. Had more of a chronic meniscal tear. Patient seemed to be doing relatively well for quite some time. Patient was last seen by me 3 months go. Patient states no change, increasing instability, mild swelling, affecting ADL's.  States steroid injection was not helpful.  Patient is concerned because she thinks it is worsening over the course of time.   Also having pain everywhere, all muscles, and even back. Sometimes bad enough she stay in bed.  Has missed work secondary to pain  Patient states that this seems to be more aggravating recently. States that she has had so many different complaints muscular pain. Was told it was secondary. Diabetes better A1c is was at 6.4.Still having worsening     She did have x-rays previously. These were reviewed by me. Patient's x-rays show no significant bony abnormality. No early signs of arthritis.      Past Medical History:  Diagnosis Date  . Arthritis   . Campylobacter enteritis May 2012   [facility] admission  . Carpal tunnel syndrome, bilateral   . Diabetes mellitus   . Gastritis with bleeding due to alcohol 2010   [facility] admission  . GERD (gastroesophageal reflux disease)   . Hyperlipidemia   . Hypertension    no meds  . Wears glasses    Past Surgical History:  Procedure Laterality Date  . CARPAL TUNNEL RELEASE Left 05/14/2014   Procedure: LEFT ULNAR NEUROPLASTY AT ELBOW AND ENDOSCOPIC CARPAL TUNNEL RELEASE;  Surgeon: Jolyn Nap, MD;  Location: [place];  Service: Orthopedics;  Laterality: Left;  . CYST EXCISION  1995   under arms  . endometrial biopsy  2004   benign  . FOOT OSTEOTOMY  2010   right-spurs  . TONSILLECTOMY    . ULNAR NERVE TRANSPOSITION Left 05/14/2014   Procedure:  ULNAR NERVE DECOMPRESSION/TRANSPOSITION;  Surgeon: Jolyn Nap, MD;  Location: [place];  Service: Orthopedics;  Laterality: Left;   Social History  Substance Use Topics  . Smoking status: Former Smoker    Quit date: 05/09/2012  . Smokeless tobacco: Never Used  . Alcohol use No     Comment: not for 7 yr   Allergies  Allergen Reactions  . Oxycodone-Acetaminophen     REACTION: Unknown reaction  . Penicillins Rash   Family History  Problem Relation Age of Onset  . Hypertension Mother   . Stroke Father   . Diabetes Maternal Grandmother   . Hypertension Maternal Grandmother      Past medical history, social, surgical and family history all reviewed in electronic medical record.   Review of Systems: pan positive including, muscle pain, HA, joint pain, back pain,  Swelling.   Objective  Blood pressure 130/80, pulse 89, height 5\' 4"  (1.626 m), weight 190 lb (86.2 kg), SpO2 97 %.  Systems examined below as of 11/09/16 General: NAD A&O x3 mood, affect normal  HEENT: Pupils equal, extraocular movements intact no nystagmus Respiratory: not short of breath at rest or with speaking Cardiovascular: No lower extremity edema, non tender Skin: Warm dry intact with no signs of infection or rash on extremities or on axial skeleton. Abdomen: Soft nontender, no masses Neuro: Cranial nerves  intact, neurovascularly intact in all extremities with 2+  DTRs and 2+ pulses. Lymph: No lymphadenopathy appreciated today  Gait Antalgic  MSK: Non tender with full range of motion and good stability and symmetric strength and tone of shoulders, elbows, wrist, hips and ankles bilaterally.  Discomfort in many joints and muscles.  Knee: Left Trace swellings. Continued mild atrophy Nontender on exam. ROM full in flexion and extension and lower leg rotation. Ligaments with solid consistent endpoints including ACL, PCL, LCL, MCL. Positive! , Apley's, and Thessalonian tests. painful  patellar compression. Patellar glide with minimal crepitus. Patellar and quadriceps tendons unremarkable. Hamstring and quadriceps strength is normal.  Worsening.   Contralateral knee unremarkable     Impression and Recommendations:     This case required medical decision making of moderate complexity.

## 2016-11-09 ENCOUNTER — Ambulatory Visit (INDEPENDENT_AMBULATORY_CARE_PROVIDER_SITE_OTHER): Payer: BC Managed Care – PPO | Admitting: Family Medicine

## 2016-11-09 ENCOUNTER — Other Ambulatory Visit (INDEPENDENT_AMBULATORY_CARE_PROVIDER_SITE_OTHER): Payer: BC Managed Care – PPO

## 2016-11-09 ENCOUNTER — Encounter: Payer: Self-pay | Admitting: Family Medicine

## 2016-11-09 VITALS — BP 130/80 | HR 89 | Ht 64.0 in | Wt 190.0 lb

## 2016-11-09 DIAGNOSIS — M25562 Pain in left knee: Secondary | ICD-10-CM

## 2016-11-09 DIAGNOSIS — M2550 Pain in unspecified joint: Secondary | ICD-10-CM | POA: Diagnosis not present

## 2016-11-09 DIAGNOSIS — M23204 Derangement of unspecified medial meniscus due to old tear or injury, left knee: Secondary | ICD-10-CM | POA: Diagnosis not present

## 2016-11-09 LAB — IRON: IRON: 48 ug/dL (ref 42–145)

## 2016-11-09 LAB — C-REACTIVE PROTEIN: CRP: 0.5 mg/dL (ref 0.5–20.0)

## 2016-11-09 LAB — TSH: TSH: 1.75 u[IU]/mL (ref 0.35–4.50)

## 2016-11-09 LAB — SEDIMENTATION RATE: SED RATE: 39 mm/h — AB (ref 0–30)

## 2016-11-09 LAB — VITAMIN D 25 HYDROXY (VIT D DEFICIENCY, FRACTURES): VITD: 20.29 ng/mL — ABNORMAL LOW (ref 30.00–100.00)

## 2016-11-09 NOTE — Assessment & Plan Note (Signed)
Worsening, more instability affecting ADL's MRI ordered patient would do surgery if needed

## 2016-11-09 NOTE — Patient Instructions (Addendum)
Good to see you  We will get MRI of left knee.  Ice is your friend.  pennsaid pinkie amount topically 2 times daily as needed.   We will get labs  I will call you and tell you what is next  Happy holidays!

## 2016-11-09 NOTE — Assessment & Plan Note (Addendum)
New problem to [name] but Worsening as well to her  Many MSK complaints over time.  Rheum work up ordered today  Discussed weight loss, diet changes, control of DM.  Depending on findings may change medcial management.

## 2016-11-10 LAB — ANA: ANA: NEGATIVE

## 2016-11-10 LAB — ANGIOTENSIN CONVERTING ENZYME: ANGIOTENSIN-CONVERTING ENZYME: 27 U/L (ref 9–67)

## 2016-11-10 LAB — ANTI-DNA ANTIBODY, DOUBLE-STRANDED

## 2016-11-10 LAB — RHEUMATOID FACTOR

## 2016-11-10 LAB — CYCLIC CITRUL PEPTIDE ANTIBODY, IGG: Cyclic Citrullin Peptide Ab: <16 Units

## 2016-11-18 ENCOUNTER — Other Ambulatory Visit: Payer: BC Managed Care – PPO

## 2016-12-18 ENCOUNTER — Emergency Department (HOSPITAL_COMMUNITY): Payer: BC Managed Care – PPO

## 2016-12-18 ENCOUNTER — Encounter (HOSPITAL_COMMUNITY): Payer: Self-pay | Admitting: *Deleted

## 2016-12-18 ENCOUNTER — Emergency Department (HOSPITAL_COMMUNITY)
Admission: EM | Admit: 2016-12-18 | Discharge: 2016-12-18 | Disposition: A | Discharge status: Home / Self Care | Payer: BC Managed Care – PPO | Attending: Emergency Medicine | Admitting: Emergency Medicine

## 2016-12-18 DIAGNOSIS — E11319 Type 2 diabetes mellitus with unspecified diabetic retinopathy without macular edema: Secondary | ICD-10-CM | POA: Insufficient documentation

## 2016-12-18 DIAGNOSIS — I10 Essential (primary) hypertension: Secondary | ICD-10-CM | POA: Diagnosis not present

## 2016-12-18 DIAGNOSIS — Z87891 Personal history of nicotine dependence: Secondary | ICD-10-CM | POA: Insufficient documentation

## 2016-12-18 DIAGNOSIS — G5601 Carpal tunnel syndrome, right upper limb: Secondary | ICD-10-CM | POA: Diagnosis not present

## 2016-12-18 DIAGNOSIS — L0291 Cutaneous abscess, unspecified: Secondary | ICD-10-CM

## 2016-12-18 DIAGNOSIS — Z794 Long term (current) use of insulin: Secondary | ICD-10-CM | POA: Insufficient documentation

## 2016-12-18 DIAGNOSIS — M25531 Pain in right wrist: Secondary | ICD-10-CM | POA: Diagnosis present

## 2016-12-18 DIAGNOSIS — N611 Abscess of the breast and nipple: Secondary | ICD-10-CM | POA: Insufficient documentation

## 2016-12-18 LAB — CBC WITH DIFFERENTIAL/PLATELET
Basophils Absolute: 0 10*3/uL (ref 0.0–0.1)
Basophils Relative: 0 %
Eosinophils Absolute: 0.4 10*3/uL (ref 0.0–0.7)
Eosinophils Relative: 4 %
HEMATOCRIT: 39.1 % (ref 36.0–46.0)
Hemoglobin: 12.9 g/dL (ref 12.0–15.0)
LYMPHS ABS: 2.5 10*3/uL (ref 0.7–4.0)
LYMPHS PCT: 23 %
MCH: 29.6 pg (ref 26.0–34.0)
MCHC: 33 g/dL (ref 30.0–36.0)
MCV: 89.7 fL (ref 78.0–100.0)
MONO ABS: 0.4 10*3/uL (ref 0.1–1.0)
MONOS PCT: 3 %
NEUTROS PCT: 70 %
Neutro Abs: 7.3 10*3/uL (ref 1.7–7.7)
PLATELETS: 357 10*3/uL (ref 150–400)
RBC: 4.36 MIL/uL (ref 3.87–5.11)
RDW: 12.7 % (ref 11.5–15.5)
WBC: 10.6 10*3/uL — AB (ref 4.0–10.5)

## 2016-12-18 LAB — COMPREHENSIVE METABOLIC PANEL
ALT: 16 U/L (ref 14–54)
AST: 18 U/L (ref 15–41)
Albumin: 3.8 g/dL (ref 3.5–5.0)
Alkaline Phosphatase: 88 U/L (ref 38–126)
Anion gap: 9 (ref 5–15)
BILIRUBIN TOTAL: 0.7 mg/dL (ref 0.3–1.2)
BUN: 12 mg/dL (ref 6–20)
CHLORIDE: 108 mmol/L (ref 101–111)
CO2: 24 mmol/L (ref 22–32)
Calcium: 9.2 mg/dL (ref 8.9–10.3)
Creatinine, Ser: 0.49 mg/dL (ref 0.44–1.00)
GFR calc non Af Amer: >60 mL/min (ref >60)
Glucose, Bld: 170 mg/dL — ABNORMAL HIGH (ref 65–99)
POTASSIUM: 4.1 mmol/L (ref 3.5–5.1)
Sodium: 141 mmol/L (ref 135–145)
TOTAL PROTEIN: 7.3 g/dL (ref 6.5–8.1)

## 2016-12-18 MED ORDER — NAPROXEN 500 MG PO TABS: 500.0000 mg | ORAL_TABLET | Freq: Two times a day (BID) | ORAL | 0 refills | Status: DC

## 2016-12-18 MED ORDER — IBUPROFEN 800 MG PO TABS
800.0000 mg | ORAL_TABLET | Freq: Once | ORAL | Status: AC
  Administered 2016-12-18: 800 mg via ORAL
  Filled 2016-12-18: qty 1

## 2016-12-18 MED ORDER — DOXYCYCLINE HYCLATE 100 MG PO CAPS: 100.0000 mg | ORAL_CAPSULE | Freq: Two times a day (BID) | ORAL | 0 refills | Status: DC

## 2016-12-18 MED ORDER — TRAMADOL HCL 50 MG PO TABS
50.0000 mg | ORAL_TABLET | Freq: Once | ORAL | Status: AC
  Administered 2016-12-18: 50 mg via ORAL
  Filled 2016-12-18: qty 1

## 2016-12-18 MED ORDER — LIDOCAINE-EPINEPHRINE 1 %-1:100000 IJ SOLN: 10.0000 mL | Freq: Once | INTRAMUSCULAR | Status: DC

## 2016-12-18 MED ORDER — LIDOCAINE-EPINEPHRINE (PF) 2 %-1:200000 IJ SOLN
10.0000 mL | Freq: Once | INTRAMUSCULAR | Status: AC
  Administered 2016-12-18: 5 mL
  Filled 2016-12-18: qty 20

## 2016-12-18 MED ORDER — TRAMADOL HCL 50 MG PO TABS: 50.0000 mg | ORAL_TABLET | Freq: Four times a day (QID) | ORAL | 0 refills | Status: DC | PRN

## 2016-12-18 NOTE — ED Provider Notes (Signed)
[facility] DEPT Provider Note   CSN: [id number] Arrival date & time: 12/18/16  0841     History   Chief Complaint Chief Complaint  Patient presents with  . Arm Pain    HPI [name] is a 56 y.o. female.  Patient has 2 complaints. One is right arm and wrist pain secondary is an abscess under her right breast. Both of these are recurrent problems for her.  She states she had right arm pain for one any air. She had a surgical procedure on her left wrist by a local hand surgeon. She states that he told her to take more than a year for the pain to go away so she never called him back regarding her right wrist. She states she was told she would eventually have to have a surgery on her right wrist as well. His become more and more painful over the last few weeks. 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  She has an area under her right breast that is in the same area where she had a previous abscess incised and drained over a year ago. Redness pain and swelling no spontaneous drainage.  HPI  Past Medical History:  Diagnosis Date  . Arthritis   . Campylobacter enteritis May 2012   [facility] admission  . Carpal tunnel syndrome, bilateral   . Diabetes mellitus   . Gastritis with bleeding due to alcohol 2010   [facility] admission  . GERD (gastroesophageal reflux disease)   . Hyperlipidemia   . Hypertension    no meds  . Wears glasses     Patient Active Problem List   Diagnosis Date Noted  . Polyarthralgia 11/09/2016  . Grief reaction 07/24/2016  . Uncontrolled type 2 diabetes mellitus with retinopathy, with long-term current use of insulin ([place]) 07/07/2016  . Visit for preventive health examination 06/21/2016  . Increased urinary frequency 06/21/2016  . Chronic meniscal tear of knee 08/19/2015  . Left knee pain 06/19/2015  . Candidiasis of skin 06/18/2015  . Genital herpes 07/31/2014  . Furuncle of female breast 09/21/2013  . Ulnar nerve compression  09/21/2013  . Noncompliance with diet and medication regimen 11/15/2012  . Tenosynovitis of thumb 01/02/2012  . Arthritis   . Screening for breast cancer 08/04/2011  . Screening for colon cancer 08/04/2011  . HIDRADENITIS SUPPURATIVA 04/11/2007  . GERD 11/17/2006  . ENDOMETRIAL POLYP 11/17/2006  . LOW BACK PAIN 11/17/2006  . FIBROIDS, UTERUS 11/16/2006  . Hyperlipidemia associated with type 2 diabetes mellitus ([place]) 11/16/2006  . ALCOHOL ABUSE, EPISODIC 11/16/2006  . Essential hypertension 11/16/2006    Past Surgical History:  Procedure Laterality Date  . CARPAL TUNNEL RELEASE Left 05/14/2014   Procedure: LEFT ULNAR NEUROPLASTY AT ELBOW AND ENDOSCOPIC CARPAL TUNNEL RELEASE;  Surgeon: Jolyn Nap, MD;  Location: [place];  Service: Orthopedics;  Laterality: Left;  . CYST EXCISION  1995   under arms  . endometrial biopsy  2004   benign  . FOOT OSTEOTOMY  2010   right-spurs  . TONSILLECTOMY    . ULNAR NERVE TRANSPOSITION Left 05/14/2014   Procedure: ULNAR NERVE DECOMPRESSION/TRANSPOSITION;  Surgeon: Jolyn Nap, MD;  Location: [place] [facility];  Service: Orthopedics;  Laterality: Left;    OB History    No data available       Home Medications    Prior to Admission medications   Medication Sig Start Date End Date Taking? Authorizing Provider  atorvastatin (LIPITOR) 40 MG tablet TAKE  ONE TABLET BY MOUTH ONCE DAILY 07/22/16   Crecencio Mc, MD  doxycycline (VIBRAMYCIN) 100 MG capsule Take 1 capsule (100 mg total) by mouth 2 (two) times daily. 12/18/16   Tanna Furry, MD  gabapentin (NEURONTIN) 300 MG capsule Take 1 capsule (300 mg total) by mouth 2 (two) times daily. 10/08/16   Philemon Kingdom, MD  glucose blood (BAYER CONTOUR TEST) test strip TEST BLOOD SUGARS 3 TIMES DAILY 08/12/16   Philemon Kingdom, MD  insulin aspart (NOVOLOG) 100 UNIT/ML injection Inject into the skin 10-30 units before meals as advised 10/08/16   Philemon Kingdom, MD    insulin NPH Human (NOVOLIN N RELION) 100 UNIT/ML injection Inject 15 units under skin in the AM and 45 units in the evening 10/08/16   Philemon Kingdom, MD  Lancet Devices (EASY TOUCH LANCING DEVICE) MISC Use to check diabetes 4 times daily  E 11.65 08/12/16   Philemon Kingdom, MD  lidocaine (LIDODERM) 5 % Reported on 06/19/2016 01/28/16   Historical Provider, MD  metFORMIN (GLUCOPHAGE) 1000 MG tablet TAKE ONE TABLET BY MOUTH TWICE DAILY WITH A MEAL 10/02/16   Crecencio Mc, MD  naproxen (NAPROSYN) 500 MG tablet Take 1 tablet (500 mg total) by mouth 2 (two) times daily. 12/18/16   Tanna Furry, MD  NOVOLIN R RELION 100 UNIT/ML injection INJECT UP TO 50 UNITS INTO THE SKIN DAILY AS DIRECTED 09/21/16   Philemon Kingdom, MD  nystatin (MYCOSTATIN/NYSTOP) 100000 UNIT/GM POWD Apply powder to affected area twice daily until rash has resolved 06/18/15   Crecencio Mc, MD  omeprazole (PRILOSEC) 40 MG capsule Take 1 capsule (40 mg total) by mouth daily. 07/22/16   Crecencio Mc, MD  terbinafine (LAMISIL) 250 MG tablet Reported on 06/19/2016 04/01/16   Historical Provider, MD  traMADol (ULTRAM) 50 MG tablet Take 1 tablet (50 mg total) by mouth every 6 (six) hours as needed. 12/18/16   Tanna Furry, MD  valACYclovir (VALTREX) 500 MG tablet Take 1 tablet (500 mg total) by mouth 3 (three) times daily. Reported on 06/19/2016 06/19/16   Crecencio Mc, MD    Family History Family History  Problem Relation Age of Onset  . Hypertension Mother   . Stroke Father   . Diabetes Maternal Grandmother   . Hypertension Maternal Grandmother     Social History Social History  Substance Use Topics  . Smoking status: Former Smoker    Quit date: 05/09/2012  . Smokeless tobacco: Never Used  . Alcohol use No     Comment: not for 7 yr     Allergies   Oxycodone-acetaminophen and Penicillins   Review of Systems Review of Systems  Constitutional: Negative for appetite change, chills, diaphoresis, fatigue and fever.  HENT:  Negative for mouth sores, sore throat and trouble swallowing.   Eyes: Negative for visual disturbance.  Respiratory: Negative for cough, chest tightness, shortness of breath and wheezing.   Cardiovascular: Negative for chest pain.  Gastrointestinal: Negative for abdominal distention, abdominal pain, diarrhea, nausea and vomiting.  Endocrine: Negative for polydipsia, polyphagia and polyuria.  Genitourinary: Negative for dysuria, frequency and hematuria.  Musculoskeletal: Negative for gait problem.       Right wrist pain. She states it makes her arm hurt all the way to her neck  Skin: Negative for color change, pallor and rash.       Right breast abscess  Neurological: Negative for dizziness, syncope, light-headedness and headaches.  Hematological: Does not bruise/bleed easily.  Psychiatric/Behavioral: Negative for behavioral problems  and confusion.     Physical Exam Updated Vital Signs BP 145/83   Pulse 84   Temp 98.1 F (36.7 C) (Oral)   Resp 16   Ht 5\' 3"  (1.6 m)   Wt 190 lb (86.2 kg)   SpO2 98%   BMI 33.66 kg/m   Physical Exam  Constitutional: She is oriented to person, place, and time. She appears well-developed and well-nourished. No distress.  HENT:  Head: Normocephalic.  Eyes: Conjunctivae are normal. Pupils are equal, round, and reactive to light. No scleral icterus.  Neck: Normal range of motion. Neck supple. No thyromegaly present.  Cardiovascular: Normal rate and regular rhythm.  Exam reveals no gallop and no friction rub.   No murmur heard. Pulmonary/Chest: Effort normal and breath sounds normal. No respiratory distress. She has no wheezes. She has no rales.  Abdominal: Soft. Bowel sounds are normal. She exhibits no distension. There is no tenderness. There is no rebound.  Musculoskeletal: Normal range of motion.  No soft tissue swelling erythema or warmth around the right wrist pain she is tender primarily over the wrist crease. Has normal sensation to the distal  tips and good capillary refill. Exam consistent with carpal tunnel  Neurological: She is alert and oriented to person, place, and time.  Skin: Skin is warm and dry. No rash noted.  Abscess of the right breast at the 7:00 position.  Psychiatric: She has a normal mood and affect. Her behavior is normal.     ED Treatments / Results  Labs (all labs ordered are listed, but only abnormal results are displayed) Labs Reviewed  CBC WITH DIFFERENTIAL/PLATELET - Abnormal; Notable for the following:       Result Value   WBC 10.6 (*)    All other components within normal limits  COMPREHENSIVE METABOLIC PANEL - Abnormal; Notable for the following:    Glucose, Bld 170 (*)    All other components within normal limits    EKG  EKG Interpretation None       Radiology Dg Wrist Complete Left  Result Date: 12/18/2016 CLINICAL DATA:  BILATERAL wrist pain for while, swelling, pain more severe in RIGHT wrist in last few days radiating into upper arm, no known injuries EXAM: LEFT WRIST - COMPLETE 3+ VIEW COMPARISON:  None FINDINGS: Osseous mineralization normal. Congenital lunatotriquetral fusion. Remaining joint spaces preserved. No acute fracture, dislocation, or bone destruction. IMPRESSION: No acute osseous abnormalities. Electronically Signed   By: Lavonia Dana M.D.   On: 12/18/2016 10:59   Dg Wrist Complete Right  Result Date: 12/18/2016 CLINICAL DATA:  Right wrist pain. EXAM: RIGHT WRIST - COMPLETE 3+ VIEW COMPARISON:  None. FINDINGS: There is evidence of cystic disease and sclerosis of the lunate without obvious bony collapse or visible fracture. Findings may be consistent with early stage of Kienbock disease (osteonecrosis of the lunate). No other bony abnormalities, lesions or significant arthropathy. Soft tissues are unremarkable. IMPRESSION: Sclerosis and cystic change involving the lunate without evidence of overt lunate collapse. Findings may be consistent with an early stage of Kienbock  disease. Electronically Signed   By: Aletta Edouard M.D.   On: 12/18/2016 11:05    Procedures Procedures (including critical care time)  Medications Ordered in ED Medications  lidocaine-EPINEPHrine (XYLOCAINE W/EPI) 1 %-1:100000 (with pres) injection 10 mL (10 mLs Infiltration Not Given 12/18/16 1231)  lidocaine-EPINEPHrine (XYLOCAINE W/EPI) 2 %-1:200000 (PF) injection 10 mL (5 mLs Other Given 12/18/16 1230)  traMADol (ULTRAM) tablet 50 mg (50 mg Oral Given 12/18/16  1149)  ibuprofen (ADVIL,MOTRIN) tablet 800 mg (800 mg Oral Given 12/18/16 1149)     Initial Impression / Assessment and Plan / ED Course  I have reviewed the triage vital signs and the nursing notes.  Pertinent labs & imaging results that were available during my care of the patient were reviewed by me and considered in my medical decision making (see chart for details).     X-ray show old abnormalities of the lunate consistent with possible avascular necrosis. She was fitted with a volar wrist splint. She was given hand surgery for follow-up.INCISION AND DRAINAGE Performed by: Lolita Patella Consent: Verbal consent obtained. Risks and benefits: risks, benefits and alternatives were discussed Type: abscess  Body area: Right breast  Anesthesia: local infiltration  Incision was made with a scalpel.  Local anesthetic: lidocaine 1% % with epinephrine  Anesthetic total: 4 ml  Complexity: complex Blunt dissection to break up loculations  Drainage: purulent  Drainage amount: Moderate, thin purulent   Packing material: 1/4 in iodoform gauze  Patient tolerance: Patient tolerated the procedure well with no immediate complications.       Final Clinical Impressions(s) / ED Diagnoses   Final diagnoses:  Carpal tunnel syndrome of right wrist  Abscess   Splint was properly positioned. Patient was shown the wick of gauze protruding from the wound before it was dressed. She will remove this in 48 hours and  continue soaks and gentle massage. Primary care follow-up.   New Prescriptions New Prescriptions   DOXYCYCLINE (VIBRAMYCIN) 100 MG CAPSULE    Take 1 capsule (100 mg total) by mouth 2 (two) times daily.   NAPROXEN (NAPROSYN) 500 MG TABLET    Take 1 tablet (500 mg total) by mouth 2 (two) times daily.   TRAMADOL (ULTRAM) 50 MG TABLET    Take 1 tablet (50 mg total) by mouth every 6 (six) hours as needed.     Tanna Furry, MD 12/18/16 1252

## 2016-12-18 NOTE — Discharge Instructions (Signed)
Soak and remove the gauze from your wound on Sunday.   Follow-up with your primary care physician.

## 2016-12-18 NOTE — ED Triage Notes (Addendum)
Pt states R wrist pain x 3 days that now radiates to entire arm.  Obvious swelling noted to R wrist.  Pt also states abscess immediately below R breast.  D/t hx of uncontrolled diabetes, acuity increased 3.

## 2016-12-18 NOTE — ED Notes (Signed)
Pt A&OX4, ambulatory at d/c with steady gait, NAD 

## 2016-12-22 ENCOUNTER — Telehealth: Payer: Self-pay | Admitting: Internal Medicine

## 2016-12-22 NOTE — Telephone Encounter (Signed)
Pt called and stated that she went to the ED on Friday for her carpal tunnel in arm and she also had her right breast lanced. Please advise, as to where to schedule. Thank you!  Call pt @ [phone]

## 2016-12-22 NOTE — Telephone Encounter (Signed)
Pt scheduled  

## 2016-12-22 NOTE — Telephone Encounter (Signed)
There is a cancellation Friday 10 am.

## 2016-12-25 ENCOUNTER — Encounter: Payer: Self-pay | Admitting: Internal Medicine

## 2016-12-25 ENCOUNTER — Ambulatory Visit (INDEPENDENT_AMBULATORY_CARE_PROVIDER_SITE_OTHER): Payer: BC Managed Care – PPO | Admitting: Internal Medicine

## 2016-12-25 VITALS — BP 124/70 | HR 78 | Temp 97.9°F | Resp 16 | Ht 63.0 in | Wt 193.2 lb

## 2016-12-25 DIAGNOSIS — G5621 Lesion of ulnar nerve, right upper limb: Secondary | ICD-10-CM

## 2016-12-25 DIAGNOSIS — N611 Abscess of the breast and nipple: Secondary | ICD-10-CM | POA: Diagnosis not present

## 2016-12-25 DIAGNOSIS — M25532 Pain in left wrist: Secondary | ICD-10-CM

## 2016-12-25 DIAGNOSIS — Z1231 Encounter for screening mammogram for malignant neoplasm of breast: Secondary | ICD-10-CM

## 2016-12-25 DIAGNOSIS — M25531 Pain in right wrist: Secondary | ICD-10-CM

## 2016-12-25 DIAGNOSIS — E113299 Type 2 diabetes mellitus with mild nonproliferative diabetic retinopathy without macular edema, unspecified eye: Secondary | ICD-10-CM

## 2016-12-25 DIAGNOSIS — Z1239 Encounter for other screening for malignant neoplasm of breast: Secondary | ICD-10-CM

## 2016-12-25 DIAGNOSIS — E1165 Type 2 diabetes mellitus with hyperglycemia: Secondary | ICD-10-CM

## 2016-12-25 DIAGNOSIS — Z794 Long term (current) use of insulin: Secondary | ICD-10-CM

## 2016-12-25 MED ORDER — TRAMADOL HCL 50 MG PO TABS: 50.0000 mg | ORAL_TABLET | Freq: Four times a day (QID) | ORAL | 2 refills | Status: DC | PRN

## 2016-12-25 NOTE — Progress Notes (Signed)
Subjective:  Patient ID: [name], female    DOB: [birth year]  Age: 56 y.o. MRN: [id number]  CC: The primary encounter diagnosis was Bilateral wrist pain. Diagnoses of Breast cancer screening, Furuncle of female breast, Entrapment of right ulnar nerve, Uncontrolled type 2 diabetes mellitus with mild nonproliferative retinopathy, with long-term current use of insulin, macular edema presence unspecified, unspecified laterality ([place]), and Wrist pain, acute, right were also pertinent to this visit.  HPI GRAYCIN MARCUCCI presents for   ER FOLLOW UP.  SEEN AT  [place] ON Jan 19th for multiple issues including a skin abscess involving the inferior (underside) of right breast,   Pain involving her right arm and right wrist. Underwent I & D of beast abscess by EDP and abscess was packed with iodoform gauze.  Instructed to remove the gauze in 48 hours and continue soaks and gentle massage. Doxycycline prescribed along with tramadol and ibuprofen. 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   Knees and feet bothering her.  Has had cortisone injection.  Told she needs an MRI of knee but can't afford it   Having hot flashes at night and during the day. Does not want treatment     Blood sugars are dropping   In the afternoon while at work. Caused by not eating snacks.  Last a1c  Of 6.4  In Nov per Dr Renne Crigler         Outpatient Medications Prior to Visit  Medication Sig Dispense Refill  . doxycycline (VIBRAMYCIN) 100 MG capsule Take 1 capsule (100 mg total) by mouth 2 (two) times daily. 20 capsule 0  . gabapentin (NEURONTIN) 300 MG capsule Take 1 capsule (300 mg total) by mouth 2 (two) times daily. 180 capsule 1  . glucose blood (BAYER CONTOUR TEST) test strip TEST BLOOD SUGARS 3 TIMES DAILY 200 each 11  . insulin aspart (NOVOLOG) 100 UNIT/ML  injection Inject into the skin 10-30 units before meals as advised 20 mL 2  . insulin NPH Human (NOVOLIN N RELION) 100 UNIT/ML injection Inject 15 units under skin in the AM and 45 units in the evening 30 mL 5  . Lancet Devices (EASY TOUCH LANCING DEVICE) MISC Use to check diabetes 4 times daily  E 11.65 1 each 11  . lidocaine (LIDODERM) 5 % Reported on 06/19/2016    . metFORMIN (GLUCOPHAGE) 1000 MG tablet TAKE ONE TABLET BY MOUTH TWICE DAILY WITH A MEAL 60 tablet 5  . naproxen (NAPROSYN) 500 MG tablet Take 1 tablet (500 mg total) by mouth 2 (two) times daily. 30 tablet 0  . NOVOLIN R RELION 100 UNIT/ML injection INJECT UP TO 50 UNITS INTO THE SKIN DAILY AS DIRECTED 10 mL 5  . nystatin (MYCOSTATIN/NYSTOP) 100000 UNIT/GM POWD Apply powder to affected area twice daily until rash has resolved 15 g 3  . omeprazole (PRILOSEC) 40 MG capsule Take 1 capsule (40 mg total) by mouth daily. 30 capsule 5  . terbinafine (LAMISIL) 250 MG tablet Reported on 06/19/2016    . valACYclovir (VALTREX) 500 MG tablet Take 1 tablet (500 mg total) by mouth 3 (three) times daily. Reported on 06/19/2016 21 tablet 3  . traMADol (ULTRAM) 50 MG tablet Take 1 tablet (50 mg total) by mouth every 6 (six) hours as needed. 15 tablet  0  . atorvastatin (LIPITOR) 40 MG tablet TAKE ONE TABLET BY MOUTH ONCE DAILY (Patient not taking: Reported on 12/25/2016) 30 tablet 5   No facility-administered medications prior to visit.     Review of Systems;  Patient denies headache, fevers, malaise, unintentional weight loss, skin rash, eye pain, sinus congestion and sinus pain, sore throat, dysphagia,  hemoptysis , cough, dyspnea, wheezing, chest pain, palpitations, orthopnea, edema, abdominal pain, nausea, melena, diarrhea, constipation, flank pain, dysuria, hematuria, urinary  Frequency, nocturia, numbness, tingling, seizures,  Focal weakness, Loss of consciousness,  Tremor, insomnia, depression, anxiety, and suicidal ideation.      Objective:    BP 124/70   Pulse 78   Temp 97.9 F (36.6 C) (Oral)   Resp 16   Ht 5\' 3"  (1.6 m)   Wt 193 lb 4 oz (87.7 kg)   SpO2 98%   BMI 34.23 kg/m   BP Readings from Last 3 Encounters:  12/25/16 124/70  12/18/16 (!) 151/101  11/09/16 130/80    Wt Readings from Last 3 Encounters:  12/25/16 193 lb 4 oz (87.7 kg)  12/18/16 190 lb (86.2 kg)  11/09/16 190 lb (86.2 kg)    General appearance: alert, cooperative and appears stated age Ears: normal TM's and external ear canals both ears Throat: lips, mucosa, and tongue normal; teeth and gums normal Neck: no adenopathy, no carotid bruit, supple, symmetrical, trachea midline and thyroid not enlarged, symmetric, no tenderness/mass/nodules Back: symmetric, no curvature. ROM normal. No CVA tenderness. Lungs: clear to auscultation bilaterally Heart: regular rate and rhythm, S1, S2 normal, no murmur, click, rub or gallop Abdomen: soft, non-tender; bowel sounds normal; no masses,  no organomegaly Pulses: 2+ and symmetric Skin: Skin color, texture, turgor normal. No rashes or lesions Lymph nodes: Cervical, supraclavicular, and axillary nodes normal.  Lab Results  Component Value Date   HGBA1C 6.4 10/08/2016   HGBA1C 10.4 07/07/2016   HGBA1C 10.4 04/07/2016    Lab Results  Component Value Date   CREATININE 0.49 12/18/2016   CREATININE 0.63 06/19/2016   CREATININE 0.73 06/05/2016    Lab Results  Component Value Date   WBC 10.6 (H) 12/18/2016   HGB 12.9 12/18/2016   HCT 39.1 12/18/2016   PLT 357 12/18/2016   GLUCOSE 170 (H) 12/18/2016   CHOL 124 (L) 06/19/2016   TRIG 211 (H) 06/19/2016   HDL 44 (L) 06/19/2016   LDLDIRECT 94.0 09/25/2015   LDLCALC 38 06/19/2016   ALT 16 12/18/2016   AST 18 12/18/2016   NA 141 12/18/2016   K 4.1 12/18/2016   CL 108 12/18/2016   CREATININE 0.49 12/18/2016   BUN 12 12/18/2016   CO2 24 12/18/2016   TSH 1.75 11/09/2016   HGBA1C 6.4 10/08/2016   MICROALBUR 1.9 06/19/2016    Dg Wrist Complete  Left  Result Date: 12/18/2016 CLINICAL DATA:  BILATERAL wrist pain for while, swelling, pain more severe in RIGHT wrist in last few days radiating into upper arm, no known injuries EXAM: LEFT WRIST - COMPLETE 3+ VIEW COMPARISON:  None FINDINGS: Osseous mineralization normal. Congenital lunatotriquetral fusion. Remaining joint spaces preserved. No acute fracture, dislocation, or bone destruction. IMPRESSION: No acute osseous abnormalities. Electronically Signed   By: Lavonia Dana M.D.   On: 12/18/2016 10:59   Dg Wrist Complete Right  Result Date: 12/18/2016 CLINICAL DATA:  Right wrist pain. EXAM: RIGHT WRIST - COMPLETE 3+ VIEW COMPARISON:  None. FINDINGS: There is evidence of cystic disease and sclerosis of the lunate without obvious bony  collapse or visible fracture. Findings may be consistent with early stage of Kienbock disease (osteonecrosis of the lunate). No other bony abnormalities, lesions or significant arthropathy. Soft tissues are unremarkable. IMPRESSION: Sclerosis and cystic change involving the lunate without evidence of overt lunate collapse. Findings may be consistent with an early stage of Kienbock disease. Electronically Signed   By: Aletta Edouard M.D.   On: 12/18/2016 11:05    Assessment & Plan:   Problem List Items Addressed This Visit    Furuncle of female breast    Previously treated abscess of right  breast has healed s/p I& D and packing by ED physician.  Area is no longer tender or fluctuant and not draining.  Recurrent proble.   continue using Dial daily and packing gauze in the crease  Once a week use Hibiclens soap  From neck to neck to toes        Ulnar nerve compression    My be recurrent , vs CTS ans cause for wrist and arm pain. Marland Kitchen  Neurology referral for EMG/nerve conduction studeis       Uncontrolled type 2 diabetes mellitus with retinopathy, with long-term current use of insulin ([place])    Her diabetes is now well controlled but now managed by Dr. Renne Crigler with   use of regular insulin at each meal and NPH twice daily ,  She  has neuropathy, and is modifying her diet .  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        Other Visit Diagnoses    Bilateral wrist pain    -  Primary   Relevant Orders   Ambulatory referral to Neurology   Breast cancer screening       Relevant Orders   MM DIGITAL SCREENING BILATERAL      I have changed Ms. Hodge's traMADol. I am also having her maintain her nystatin, lidocaine, terbinafine, valACYclovir, atorvastatin, omeprazole, EASY TOUCH LANCING DEVICE, glucose blood, NOVOLIN R RELION, metFORMIN, gabapentin, insulin aspart, insulin NPH Human, doxycycline, and naproxen.  Meds ordered this encounter  Medications  . traMADol (ULTRAM) 50 MG tablet    Sig: Take 1 tablet (50 mg total) by mouth every 6 (six) hours as needed. Maximum 3 daily    Dispense:  90 tablet    Refill:  2    Medications Discontinued During This Encounter  Medication Reason  . traMADol (ULTRAM) 50 MG tablet Reorder    Follow-up: No Follow-up on file.   Crecencio Mc, MD

## 2016-12-25 NOTE — Patient Instructions (Addendum)
Your breast has healed,  continue using Dial daily and packing gauze in the crease  Once a week use Hibiclens soap  From neck to neck to toes   You have a condition in your right wrist wher eone of your bones has lost its blood supply and started to die.   You may also have carpal tunnel but we need a different test for that diagnosis  EMG/nerve conduction studies will sort this out ,  I will order these  Continue wearing the brace on the wrist if it helps .  You can continue using tramadol.  I will refill it

## 2016-12-27 DIAGNOSIS — G5601 Carpal tunnel syndrome, right upper limb: Secondary | ICD-10-CM | POA: Insufficient documentation

## 2016-12-27 NOTE — Assessment & Plan Note (Signed)
Previously treated abscess of right  breast has healed s/p I& D and packing by ED physician.  Area is no longer tender or fluctuant and not draining.  Recurrent proble.   continue using Dial daily and packing gauze in the crease  Once a week use Hibiclens soap  From neck to neck to toes

## 2016-12-27 NOTE — Assessment & Plan Note (Signed)
Her diabetes is now well controlled but now managed by Dr. Renne Crigler with  use of regular insulin at each meal and NPH twice daily ,  She  has neuropathy, and is modifying her diet .  Continue statin ,  Avoid Aspirin due to history rof gastric ulcer ane need ot use NSAIDs for other issues   Lab Results  Component Value Date   HGBA1C 6.4 10/08/2016

## 2016-12-27 NOTE — Assessment & Plan Note (Signed)
Avascular necrosis of lunate bone suggested by plain films of hand.

## 2016-12-27 NOTE — Assessment & Plan Note (Signed)
My be recurrent , vs CTS ans cause for wrist and arm pain. Marland Kitchen  Neurology referral for EMG/nerve conduction studeis

## 2017-01-07 ENCOUNTER — Ambulatory Visit (INDEPENDENT_AMBULATORY_CARE_PROVIDER_SITE_OTHER): Payer: BC Managed Care – PPO | Admitting: Neurology

## 2017-01-07 ENCOUNTER — Encounter: Payer: Self-pay | Admitting: Neurology

## 2017-01-07 VITALS — BP 104/70 | HR 94 | Ht 63.0 in

## 2017-01-07 DIAGNOSIS — R202 Paresthesia of skin: Secondary | ICD-10-CM

## 2017-01-07 DIAGNOSIS — G5623 Lesion of ulnar nerve, bilateral upper limbs: Secondary | ICD-10-CM | POA: Diagnosis not present

## 2017-01-07 DIAGNOSIS — G5603 Carpal tunnel syndrome, bilateral upper limbs: Secondary | ICD-10-CM

## 2017-01-07 NOTE — Progress Notes (Signed)
[facility] Clinic Note - Initial Visit   Date: 01/07/17  [name] MRN: [id number] DOB: 1961/09/30   Dear Dr. Derrel Nip:  Thank you for your kind referral of [name] for consultation of bilateral wrist pain. Although her history is well known to you, please allow [name] to reiterate it for the purpose of our medical record. The patient was accompanied to the clinic by self.    History of Present Illness: [name] is a 56 y.o. right-handed African [place] female with history of diabetes mellitus (HbA1c 6.4), hyperlipidemia, and arthritis presenting for evaluation of bilateral wrist pain.    Patient complains of generalized whole-body pain and paresthesias.  Pain is constant, described as achy and throbbing involving the joints and muscles.  Movement and pressure exacerbates her pain.  Nothing tends to relieve it.  She saw me for upper extremity paresthesias in December 2014 at which time EDX was ordered and showed bilateral CTS (moderate) and bilateral cubital tunnel syndrome.  She was recommended to use a wrist splint and use a soft elbow pad.  Because of no improvement, she was then referred to orthopaedics for a steroid injection.  In June 2015, she had left CTS release and ulnar nerve transposition. Unfortunately, she has not had any benefit from surgery and continues to have pain.  Her pain and paresthesias does not localize to a particular cutaneous nerve distribution, even when asked specifically the location, she replies "I just hurt everywhere".   She feels weak and tired all the time.  She works as a Clinical cytogeneticist.  She endorses imbalance, but no falls and walks unassisted.  Out-side paper records, electronic medical record, and images have been reviewed where available and summarized as:  NCS/EMG of the upper extremities 11/02/2013: 1. Bilateral median neuropathy at or distal to the wrist consistent with the diagnosis of carpal tunnel  syndrome.  These changes are moderate in degree electrically on the left and mild on the right. 2. Bilateral ulnar neuropathy with slowing across the elbow, predominately demyelinating in type, mild-moderate in degree electrically. 3. There is no evidence of a cervical radiculopathy affecting the left side.  Lab Results  Component Value Date   ESRSEDRATE 39 (H) 11/09/2016   Lab Results  Component Value Date   HGBA1C 6.4 10/08/2016   Lab Results  Component Value Date   TSH 1.75 11/09/2016     Past Medical History:  Diagnosis Date  . Arthritis   . Campylobacter enteritis May 2012   [facility] admission  . Carpal tunnel syndrome, bilateral   . Diabetes mellitus   . Gastritis with bleeding due to alcohol 2010   [facility] admission  . GERD (gastroesophageal reflux disease)   . Hyperlipidemia   . Hypertension    no meds  . Wears glasses     Past Surgical History:  Procedure Laterality Date  . CARPAL TUNNEL RELEASE Left 05/14/2014   Procedure: LEFT ULNAR NEUROPLASTY AT ELBOW AND ENDOSCOPIC CARPAL TUNNEL RELEASE;  Surgeon: Jolyn Nap, MD;  Location: [facility];  Service: Orthopedics;  Laterality: Left;  . CYST EXCISION  1995   under arms  . endometrial biopsy  2004   benign  . FOOT OSTEOTOMY  2010   right-spurs  . TONSILLECTOMY    . ULNAR NERVE TRANSPOSITION Left 05/14/2014   Procedure: ULNAR NERVE DECOMPRESSION/TRANSPOSITION;  Surgeon: Jolyn Nap, MD;  Location: [facility];  Service: Orthopedics;  Laterality: Left;  Medications:  Outpatient Encounter Prescriptions as of 01/07/2017  Medication Sig Note  . atorvastatin (LIPITOR) 40 MG tablet TAKE ONE TABLET BY MOUTH ONCE DAILY   . doxycycline (VIBRAMYCIN) 100 MG capsule Take 1 capsule (100 mg total) by mouth 2 (two) times daily.   Marland Kitchen gabapentin (NEURONTIN) 300 MG capsule Take 1 capsule (300 mg total) by mouth 2 (two) times daily.   Marland Kitchen glucose blood (BAYER CONTOUR TEST) test strip TEST BLOOD  SUGARS 3 TIMES DAILY   . insulin aspart (NOVOLOG) 100 UNIT/ML injection Inject into the skin 10-30 units before meals as advised   . insulin NPH Human (NOVOLIN N RELION) 100 UNIT/ML injection Inject 15 units under skin in the AM and 45 units in the evening   . Lancet Devices (EASY TOUCH LANCING DEVICE) MISC Use to check diabetes 4 times daily  E 11.65   . lidocaine (LIDODERM) 5 % Reported on 06/19/2016 04/01/2016: Received from: External Pharmacy  . metFORMIN (GLUCOPHAGE) 1000 MG tablet TAKE ONE TABLET BY MOUTH TWICE DAILY WITH A MEAL   . naproxen (NAPROSYN) 500 MG tablet Take 1 tablet (500 mg total) by mouth 2 (two) times daily.   Marland Kitchen NOVOLIN R RELION 100 UNIT/ML injection INJECT UP TO 50 UNITS INTO THE SKIN DAILY AS DIRECTED   . nystatin (MYCOSTATIN/NYSTOP) 100000 UNIT/GM POWD Apply powder to affected area twice daily until rash has resolved   . omeprazole (PRILOSEC) 40 MG capsule Take 1 capsule (40 mg total) by mouth daily.   Marland Kitchen terbinafine (LAMISIL) 250 MG tablet Reported on 06/19/2016 04/07/2016: Received from: External Pharmacy  . traMADol (ULTRAM) 50 MG tablet Take 1 tablet (50 mg total) by mouth every 6 (six) hours as needed. Maximum 3 daily   . valACYclovir (VALTREX) 500 MG tablet Take 1 tablet (500 mg total) by mouth 3 (three) times daily. Reported on 06/19/2016    No facility-administered encounter medications on file as of 01/07/2017.      Allergies:  Allergies  Allergen Reactions  . Oxycodone-Acetaminophen     REACTION: Unknown reaction  . Penicillins Rash    Family History: Family History  Problem Relation Age of Onset  . Hypertension Mother   . Stroke Father   . Diabetes Maternal Grandmother   . Hypertension Maternal Grandmother     Social History: Social History  Substance Use Topics  . Smoking status: Former Smoker    Quit date: 05/09/2012  . Smokeless tobacco: Never Used  . 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   She lives at home with daughter and grandson.    Review of Systems:  CONSTITUTIONAL: No fevers, chills, night sweats, or weight loss.   EYES: No visual changes or eye pain ENT: No hearing changes.  No history of nose bleeds.   RESPIRATORY: No cough, wheezing and shortness of breath.   CARDIOVASCULAR: Negative for chest pain, and palpitations.   GI: Negative for abdominal discomfort, blood in stools or black stools.  No recent change in bowel habits.   GU:  No history of incontinence.   MUSCLOSKELETAL: +++history of joint pain or swelling.  ++myalgias.   SKIN: Negative for lesions, rash, and itching.   HEMATOLOGY/ONCOLOGY: Negative for prolonged bleeding, bruising easily, and swollen nodes.  No history of cancer.   ENDOCRINE: Negative for cold +heat intolerance, polydipsia or goiter.   PSYCH:  +depression  or anxiety symptoms.   NEURO: As Above.   Vital Signs:  BP 104/70   Pulse 94   Ht 5\' 3"  (1.6 m)   SpO2 98%    General Medical Exam:   General:  Well appearing, comfortable.   Eyes/ENT: see cranial nerve examination.   Neck: No masses appreciated.  Full range of motion without tenderness.  No carotid bruits. Respiratory:  Clear to auscultation, good air entry bilaterally.   Cardiac:  Regular rate and rhythm, no murmur.   Extremities:  No deformities, edema, or skin discoloration.  Skin:  No rashes or lesions.  Neurological Exam: MENTAL STATUS including orientation to time, place, person, recent and remote memory, attention span and concentration, language, and fund of knowledge is normal.  Speech is not dysarthric.  CRANIAL NERVES: II:  No visual field defects.  Unremarkable fundi.   III-IV-VI: Pupils equal round and reactive to light.  Normal conjugate, extra-ocular eye movements in all directions of gaze.  No nystagmus.  No ptosis.  V:  Normal facial sensation.   VII:  Normal facial symmetry and movements.  VIII:  Normal hearing and  vestibular function.   IX-X:  Normal palatal movement.   XI:  Normal shoulder shrug and head rotation.   XII:  Normal tongue strength and range of motion, no deviation or fasciculation.  MOTOR:  Motor strength is 5/5 throughout.  There is marked tenderness to palpation of all of her muscle groups.  No atrophy, fasciculations or abnormal movements.  No pronator drift.  Tone is normal.    MSRs:   She yells in pain even with reflex testing Right                                                                 Left brachioradialis 2+  brachioradialis 2+  biceps 2+  biceps 2+  triceps 2+  triceps 2+  patellar 2+  patellar 2+  ankle jerk 1+  ankle jerk 1+  Hoffman no  Hoffman no  plantar response down  plantar response down   SENSORY:  Normal and symmetric perception of light touch, pinprick, and vibration.   COORDINATION/GAIT:   Normal finger-to- nose-finger.  Intact rapid alternating movements bilaterally.   Gait narrow based and stable, unassisted  IMPRESSION: Ms. Ingrim is a 56 year-old female referred for evaluation of bilateral wrist pain.  She has history of bilateral CTS and ulnar neuropathy At the elbow, and has underwent left CTS release and ulnar nerve transposition in 2015. Unfortunately, she did not notice any improvement in her symptoms. She also describes widespread whole body pain, myalgias, and polyarthralgias. Even though she may have underlying carpal tunnel syndrome and ulnar neuropathy, the diffuse nature of her symptoms may suggest a more widespread disorder of pain such as chronic pain syndrome or fibromyalgia. Specifically for her upper extremity symptoms, she will have electrodiagnostic testing to reassess and look for worsening entrapment neuropathy. Further recommendations will be based on these results.   The duration of this appointment visit was 35 minutes of face-to-face time with the patient.  Greater than 50% of this time was spent in counseling, explanation of  diagnosis, planning of further management, and coordination of care.   Thank you for allowing me to participate in patient's care.  If I can answer any additional questions, I would be pleased to do so.    Sincerely,    Deuce Paternoster K. Posey Pronto, DO

## 2017-01-07 NOTE — Patient Instructions (Addendum)
1.  NCS/EMG of the arms 2.  We will call you with the results of your testing

## 2017-01-08 ENCOUNTER — Ambulatory Visit (INDEPENDENT_AMBULATORY_CARE_PROVIDER_SITE_OTHER): Payer: BC Managed Care – PPO | Admitting: Internal Medicine

## 2017-01-08 ENCOUNTER — Encounter: Payer: Self-pay | Admitting: Internal Medicine

## 2017-01-08 VITALS — BP 128/82 | HR 83 | Ht 64.5 in | Wt 190.0 lb

## 2017-01-08 DIAGNOSIS — Z794 Long term (current) use of insulin: Secondary | ICD-10-CM | POA: Diagnosis not present

## 2017-01-08 DIAGNOSIS — E1165 Type 2 diabetes mellitus with hyperglycemia: Secondary | ICD-10-CM | POA: Diagnosis not present

## 2017-01-08 DIAGNOSIS — E113299 Type 2 diabetes mellitus with mild nonproliferative diabetic retinopathy without macular edema, unspecified eye: Secondary | ICD-10-CM | POA: Diagnosis not present

## 2017-01-08 LAB — POCT GLYCOSYLATED HEMOGLOBIN (HGB A1C): Hemoglobin A1C: 6.4

## 2017-01-08 NOTE — Patient Instructions (Addendum)
  Please continue: Insulin Before breakfast Before lunch Before dinner  R 10 units with a smaller meal 15 units with a larger meal 10 units with a smaller meal 15 units with larger meal 20 units with a smaller meal 25 units with a regular meal 30 units with a larger meal  NPH 15 - 45    Continue Metformin 1000 mg 2x a day   Please schedule a new eye exam.  Please come back for a follow-up appointment in 3 months with your log.

## 2017-01-08 NOTE — Progress Notes (Signed)
Patient ID: [name], female   DOB: [birth year], 56 y.o.   MRN: [id number]  HPI: [name] is a 56 y.o.-year-old female, returning for f/u for DM2, dx ~2000, insulin-dependent since 2010, uncontrolled, with complications (PN, + DR). Last visit 3 mo ago.  She had a breast abscess >> lanced last month. Sugars a little higher then.  Last hemoglobin A1c was: Lab Results  Component Value Date   HGBA1C 6.4 10/08/2016   HGBA1C 10.4 07/07/2016   HGBA1C 10.4 04/07/2016  Had cortisone inj this month.  Pt is on:  Insulin Before breakfast Before lunch Before dinner  R 10 units with a smaller meal 15 units with a larger meal 10 units with a smaller meal 15 units with larger meal 20 units with a smaller meal 25 units with a regular meal 30 units with a larger meal  NPH 15 - 45  - Metformin 1000 mg 2x a day   Pt was checking her sugars 2-3x a day:  - am:  120-239 >> 200s, 300 today >> 194-250 >> 200s (misses doses) >> 64, 100-193 >> 60, 83-187, 237, 271 - 2h after b'fast: 120-195 >> 154 >> n/c > 203 >> n/c - lunch: n/c >> 85, 148, 445 >> 269  >> n/c >> 47, 79 (more active at work) >> 47, 57, 60-167, 187 - mid-afternoon: 106-196, 290 >> 118, 208 >> 180 >> 40x1, 78-159, 167 >> n/c >> 106-149, 298 >> n/c - before dinner: 75-126, 189, 235 >> 36 x1, 46 x1, 81-156, 242 >> 60, 98-230, 255 >> n/c >> 46, 229 >> n/c - 2h after dinner: 72, 114-292 >> n/c >> 216, 219, 451 >> n/c  - bedtime: 75-288 >> 66, 90-241, 338 >> 118-228 >> 158-255, 296 >> n/c >> up to 300 >> 94-164 >> 92-240 - nighttime: 145-239, 338 >> n/c Lowest sugar was 36 x1 >> 60 >> 46 >> 48; she has hypoglycemia awareness at 80. Highest sugar was 408 >> 300s >> 298 >> 271.  - no CKD, last BUN/creatinine:  Lab Results  Component Value Date   BUN 12 12/18/2016   CREATININE 0.49 12/18/2016  Last ACR 0.5 06/19/13. Not on ACEI/ARB.  - last set of lipids: Lab Results  Component Value Date   CHOL 124 (L) 06/19/2016   HDL 44 (L)  06/19/2016   LDLCALC 38 06/19/2016   LDLDIRECT 94.0 09/25/2015   TRIG 211 (H) 06/19/2016   CHOLHDL 2.8 06/19/2016  She is on Lipitor.   - last eye exam was in 03/2015. + DR L>R. + glaucoma Dr Arnoldo Morale. Did not schedule a new appt despite multiple promptings. - + numbness and tingling in her feet. Seeing her podiatrist. On Neurontin 2 tabs a day  She also has a history of hypertension, hyperlipidemia-on Lipitor, episodic alcohol abuse, GERD, low back pain, osteoarthritis.   ROS: Constitutional: no weight loss, no fatigue, no nocturia, + hot flushes Eyes: nol blurry vision, no xerophthalmia ENT: no sore throat, no nodules palpated in throat, no dysphagia/odynophagia, no hoarseness Cardiovascular: no CP/SOB/palpitations/leg swelling Respiratory: no cough/SOBswelling Gastrointestinal: no N/V/D/C Musculoskeletal: + muscle/no joint aches  Skin: no rashes Neurological: no tremors/numbness/tingling/dizziness  I reviewed pt's medications, allergies, PMH, social hx, family hx, and changes were documented in the history of present illness. Otherwise, unchanged from my initial visit note.  PE: BP 128/82 (BP Location: Left Arm, Patient Position: Sitting)   Pulse 83   Ht 5' 4.5" (1.638 m)   Wt 190 lb (86.2 kg)  SpO2 98%   BMI 32.11 kg/m  Body mass index is 32.11 kg/m. Wt Readings from Last 3 Encounters:  01/08/17 190 lb (86.2 kg)  12/25/16 193 lb 4 oz (87.7 kg)  12/18/16 190 lb (86.2 kg)   Constitutional: overweight, in NAD Eyes: PERRLA, EOMI, no exophthalmos ENT: moist mucous membranes, no thyromegaly, no cervical lymphadenopathy Cardiovascular: RRR, No MRG Respiratory: CTA B Gastrointestinal: abdomen soft, NT, ND, BS+ Musculoskeletal: no deformities, strength intact in all 4 Skin: moist, warm Neurological: no tremor with outstretched hands, DTR normal in all 4  ASSESSMENT: 1. DM2, insulin-dependent, uncontrolled, with complications - peripheral neuropathy - DR   PLAN:  1.  Patient with long-standing,uncontrolled diabetes, on basal-bolus insulin regimen with ReliOn N and R insulin - she is now doing a better job with taking her insulin (missing fewer doses).  - reviewed last hba1c >> decreased from 10.4% to 6.4% (!). Today, a new HbA1c is stable, at 6.4%. - I advised her to continue:  Patient Instructions   Please continue: Insulin Before breakfast Before lunch Before dinner  Novolog 10 units with a smaller meal 15 units with a larger meal 10 units with a smaller meal 15 units with larger meal 20 units with a smaller meal 25 units with a regular meal 30 units with a larger meal  NPH 15 - 45    Continue Metformin 1000 mg 2x a day   Please schedule a new eye exam.  Please come back for a follow-up appointment in 3 months with your log.  - continue checking her sugars at different times of the day - check 3 times a day, rotating checks  - she needs a new eye exam! Again advised to schedule. - Return to clinic in 3 mo with sugar log   Philemon Kingdom, MD PhD [facility] Endocrinology

## 2017-01-08 NOTE — Addendum Note (Signed)
Addended by: Caprice Beaver T on: 01/08/2017 04:40 PM   Modules accepted: Orders

## 2017-01-19 ENCOUNTER — Telehealth: Payer: Self-pay | Admitting: Neurology

## 2017-01-19 ENCOUNTER — Ambulatory Visit (INDEPENDENT_AMBULATORY_CARE_PROVIDER_SITE_OTHER): Payer: BC Managed Care – PPO | Admitting: Neurology

## 2017-01-19 DIAGNOSIS — R202 Paresthesia of skin: Secondary | ICD-10-CM

## 2017-01-19 DIAGNOSIS — G5601 Carpal tunnel syndrome, right upper limb: Secondary | ICD-10-CM

## 2017-01-19 NOTE — Telephone Encounter (Signed)
Results of EMG discussed with patient which shows interval progression of right carpal tunnel syndrome. There is no longer evidence of left carpal tunnel syndrome or bilateral ulnar neuropathies across the wrist.  She will be referred to orthopedic hand specialist for right carpal tunnel syndrome.  I again emphasize that the diffuse nature of her pain cannot be explained by a primary neurological condition and she may have chronic pain syndrome or fibromyalgia.  Specifically, there is no evidence of a diffuse myopathy or cervical radiculopathy.  Wendy K. Posey Pronto, DO

## 2017-01-19 NOTE — Procedures (Signed)
[facility]  [address], [address]  [place], [place] [address] Tel: [phone] Fax:  [phone] Test Date:  01/19/2017  Patient: [name] DOB: December 21, 1960 Physician: Narda Amber, DO  Sex: Female Height: 5\' 4"  Ref Phys: Narda Amber, DO  ID#: [id number] Temp: 33.4C Technician: Jerilynn Mages. Dean   Patient Complaints: This is a 56 year old female referred for evaluation of bilateral upper extremity paresthesias and pain.   NCV & EMG Findings: Extensive electrodiagnostic testing of the right upper extremity and additional studies of the left shows:  1. Right median sensory nerve shows prolonged distal peak latency (4.3 ms) and reduced amplitude (8.5 V), which has worsened from her prior study dated 10/30/2013.  Left median and bilateral ulnar sensory responses are improved and within normal limits.  Mixed palmer studies on the left also within normal limits. 2. Right median motor response shows prolonged distal onset latency (4.1 ms) and reduced amplitude (4.6 mV).  Left median and bilateral ulnar motor responses are improved and within normal limits. Specifically, there is no longer ulnar nerve conduction velocity slowing across the elbow. 3. Sparse chronic motor axon loss changes are seen affecting the right abductor pollicis brevis muscle, without accompanied active denervation.  Impression: 1. Right median neuropathy at or distal to the wrist, consistent with the clinical diagnosis of carpal tunnel syndrome. Overall, these findings are severe in degree electrically.  These findings have progressed from her prior study on 11/02/2013. 2. There has been an interval resolution of previously seen left carpal tunnel syndrome and bilateral ulnar neuropathy across the elbow. 3. There is no evidence of a cervical radiculopathy or diffuse myopathy affecting the upper extremities.   ___________________________ Narda Amber, DO    Nerve Conduction Studies Anti Sensory Summary  Table   Stim Site NR Peak (ms) Norm Peak (ms) P-T Amp (V) Norm P-T Amp  Left Median Anti Sensory (2nd Digit)  33.4C  Wrist    3.6 <3.6 18.5 >15  Right Median Anti Sensory (2nd Digit)  33.4C  Wrist    4.3 <3.6 8.5 >15  Left Ulnar Anti Sensory (5th Digit)  33.4C  Wrist    3.1 <3.1 18.2 >10  Right Ulnar Anti Sensory (5th Digit)  33.4C  Wrist    3.1 <3.1 17.8 >10   Motor Summary Table   Stim Site NR Onset (ms) Norm Onset (ms) O-P Amp (mV) Norm O-P Amp Site1 Site2 Delta-0 (ms) Dist (cm) Vel (m/s) Norm Vel (m/s)  Left Median Motor (Abd Poll Brev)  33.4C  Wrist    3.1 <4.0 7.9 >6 Elbow Wrist 4.4 25.0 57 >50  Elbow    7.5  7.6         Right Median Motor (Abd Poll Brev)  33.4C  Wrist    4.1 <4.0 4.6 >6 Elbow Wrist 4.5 25.0 56 >50  Elbow    8.6  4.0         Left Ulnar Motor (Abd Dig Minimi)  33.4C  Wrist    2.2 <3.1 8.8 >7 B Elbow Wrist 3.7 21.0 57 >50  B Elbow    5.9  8.1  A Elbow B Elbow 1.9 10.0 53 >50  A Elbow    7.8  7.9         Right Ulnar Motor (Abd Dig Minimi)  33.4C  Wrist    2.3 <3.1 7.4 >7 B Elbow Wrist 3.8 19.0 50 >50  B Elbow    6.1  7.2  A Elbow B Elbow 2.0  10.0 50 >50  A Elbow    8.1  7.1          Comparison Summary Table   Stim Site NR Peak (ms) Norm Peak (ms) P-T Amp (V) Site1 Site2 Delta-P (ms) Norm Delta (ms)  Left Median/Ulnar Palm Comparison (Wrist - 8cm)  33.4C  Median Palm    2.0 <2.2 16.6 Median Palm Ulnar Palm 0.1   Ulnar Palm    2.1 <2.2 12.0       EMG   Side Muscle Ins Act Fibs Psw Fasc Number Recrt Dur Dur. Amp Amp. Poly Poly. Comment  Left Deltoid Nml Nml Nml Nml Nml Nml Nml Nml Nml Nml Nml Nml N/A  Left 1stDorInt Nml Nml Nml Nml Nml Nml Nml Nml Nml Nml Nml Nml N/A  Left Abd Poll Brev Nml Nml Nml Nml Nml Nml Nml Nml Nml Nml Nml Nml N/A  Left Ext Indicis Nml Nml Nml Nml Nml Nml Nml Nml Nml Nml Nml Nml N/A  Left PronatorTeres Nml Nml Nml Nml Nml Nml Nml Nml Nml Nml Nml Nml N/A  Left Biceps Nml Nml Nml Nml Nml Nml Nml Nml Nml Nml Nml Nml N/A   Left Triceps Nml Nml Nml Nml Nml Nml Nml Nml Nml Nml Nml Nml N/A  Right 1stDorInt Nml Nml Nml Nml Nml Nml Nml Nml Nml Nml Nml Nml N/A  Right Ext Indicis Nml Nml Nml Nml Nml Nml Nml Nml Nml Nml Nml Nml N/A  Right PronatorTeres Nml Nml Nml Nml Nml Nml Nml Nml Nml Nml Nml Nml N/A  Right Biceps Nml Nml Nml Nml Nml Nml Nml Nml Nml Nml Nml Nml N/A  Right Triceps Nml Nml Nml Nml Nml Nml Nml Nml Nml Nml Nml Nml N/A  Right Deltoid Nml Nml Nml Nml Nml Nml Nml Nml Nml Nml Nml Nml N/A  Right Abd Poll Brev Nml Nml Nml Nml 1- Rapid Some 1+ Some 1+ Nml Nml N/A      Waveforms:

## 2017-01-20 NOTE — Telephone Encounter (Signed)
Referral faxed to [facility].

## 2017-01-22 ENCOUNTER — Encounter: Payer: Self-pay | Admitting: Internal Medicine

## 2017-01-22 ENCOUNTER — Ambulatory Visit (INDEPENDENT_AMBULATORY_CARE_PROVIDER_SITE_OTHER): Payer: BC Managed Care – PPO | Admitting: Internal Medicine

## 2017-01-22 DIAGNOSIS — M25562 Pain in left knee: Secondary | ICD-10-CM

## 2017-01-22 DIAGNOSIS — E1169 Type 2 diabetes mellitus with other specified complication: Secondary | ICD-10-CM | POA: Diagnosis not present

## 2017-01-22 DIAGNOSIS — G5601 Carpal tunnel syndrome, right upper limb: Secondary | ICD-10-CM | POA: Diagnosis not present

## 2017-01-22 DIAGNOSIS — G8929 Other chronic pain: Secondary | ICD-10-CM | POA: Diagnosis not present

## 2017-01-22 DIAGNOSIS — E785 Hyperlipidemia, unspecified: Secondary | ICD-10-CM | POA: Diagnosis not present

## 2017-01-22 MED ORDER — ESCITALOPRAM OXALATE 5 MG PO TABS: 5.0000 mg | ORAL_TABLET | Freq: Every day | ORAL | 5 refills | Status: DC

## 2017-01-22 MED ORDER — TRAMADOL HCL 50 MG PO TABS: 50.0000 mg | ORAL_TABLET | Freq: Four times a day (QID) | ORAL | 2 refills | Status: DC | PRN

## 2017-01-22 NOTE — Patient Instructions (Addendum)
Your diabetes is finally under control, so you need to get your knee taken care of.   I recommend that you get the MRI  THAT DR Lawai can use tramadol every 8 hours and gabapentin every 8 hours for pain

## 2017-01-22 NOTE — Progress Notes (Signed)
Subjective:  Patient ID: [name], female    DOB: 17-Jul-1961  Age: 56 y.o. MRN: [id number]  CC: Diagnoses of Chronic pain of left knee, Carpal tunnel syndrome on right, and Hyperlipidemia associated with type 2 diabetes mellitus ([place]) were pertinent to this visit.  HPI [name] presents for 3 month follow up  Multiple multiple pain complaints today.   Patient has  Several pain complaints today involving neck, shoulders and knees  Sees [facility] for back Charlann Boxer for knee pain, chronic  meniscal tear.  Has not had the MRI . Knee swells ;legs feel tight when she walks   Avascular necrosis of lunate bone Jan 2018 Neurology evaluation for parasthesias, right wirst pain:  EMG shows severe CTS,  No cervical radiculopathy  Hot flashes ,  Waking up in a sweat which aggravates sher recurrent skin boils.  willling to try lexapro   Dm finally controlled,  Sees dr Renne Crigler   Lab Results  Component Value Date   HGBA1C 6.4 01/08/2017     Outpatient Medications Prior to Visit  Medication Sig Dispense Refill  . atorvastatin (LIPITOR) 40 MG tablet TAKE ONE TABLET BY MOUTH ONCE DAILY 30 tablet 5  . gabapentin (NEURONTIN) 300 MG capsule Take 1 capsule (300 mg total) by mouth 2 (two) times daily. 180 capsule 1  . glucose blood (BAYER CONTOUR TEST) test strip TEST BLOOD SUGARS 3 TIMES DAILY 200 each 11  . insulin aspart (NOVOLOG) 100 UNIT/ML injection Inject into the skin 10-30 units before meals as advised 20 mL 2  . insulin NPH Human (NOVOLIN N RELION) 100 UNIT/ML injection Inject 15 units under skin in the AM and 45 units in the evening 30 mL 5  . Lancet Devices (EASY TOUCH LANCING DEVICE) MISC Use to check diabetes 4 times daily  E 11.65 1 each 11  . lidocaine (LIDODERM) 5 % Reported on 06/19/2016    . metFORMIN (GLUCOPHAGE) 1000 MG tablet TAKE ONE TABLET BY MOUTH TWICE DAILY WITH A MEAL 60 tablet 5  . naproxen (NAPROSYN) 500 MG tablet Take 1 tablet (500 mg total) by mouth 2 (two)  times daily. 30 tablet 0  . NOVOLIN R RELION 100 UNIT/ML injection INJECT UP TO 50 UNITS INTO THE SKIN DAILY AS DIRECTED 10 mL 5  . nystatin (MYCOSTATIN/NYSTOP) 100000 UNIT/GM POWD Apply powder to affected area twice daily until rash has resolved 15 g 3  . omeprazole (PRILOSEC) 40 MG capsule Take 1 capsule (40 mg total) by mouth daily. 30 capsule 5  . terbinafine (LAMISIL) 250 MG tablet Reported on 06/19/2016    . valACYclovir (VALTREX) 500 MG tablet Take 1 tablet (500 mg total) by mouth 3 (three) times daily. Reported on 06/19/2016 21 tablet 3  . traMADol (ULTRAM) 50 MG tablet Take 1 tablet (50 mg total) by mouth every 6 (six) hours as needed. Maximum 3 daily 90 tablet 2  . doxycycline (VIBRAMYCIN) 100 MG capsule Take 1 capsule (100 mg total) by mouth 2 (two) times daily. (Patient not taking: Reported on 01/22/2017) 20 capsule 0   No facility-administered medications prior to visit.     Review of Systems;  Patient denies headache, fevers, malaise, unintentional weight loss, skin rash, eye pain, sinus congestion and sinus pain, sore throat, dysphagia,  hemoptysis , cough, dyspnea, wheezing, chest pain, palpitations, orthopnea, edema, abdominal pain, nausea, melena, diarrhea, constipation, flank pain, dysuria, hematuria, urinary  Frequency, nocturia, numbness, tingling, seizures,  Focal weakness, Loss of consciousness,  Tremor, insomnia,  depression, anxiety, and suicidal ideation.      Objective:  BP 130/82 (BP Location: Left Arm, Patient Position: Sitting, Cuff Size: Large)   Pulse 89   Temp 98.6 F (37 C) (Oral)   Resp 16   Ht 5\' 5"  (1.651 m)   Wt 193 lb 9.6 oz (87.8 kg)   SpO2 93%   BMI 32.22 kg/m   BP Readings from Last 3 Encounters:  01/22/17 130/82  01/08/17 128/82  01/07/17 104/70    Wt Readings from Last 3 Encounters:  01/22/17 193 lb 9.6 oz (87.8 kg)  01/08/17 190 lb (86.2 kg)  12/25/16 193 lb 4 oz (87.7 kg)    General appearance: alert, cooperative and appears stated  age Ears: normal TM's and external ear canals both ears Throat: lips, mucosa, and tongue normal; teeth and gums normal Neck: no adenopathy, no carotid bruit, supple, symmetrical, trachea midline and thyroid not enlarged, symmetric, no tenderness/mass/nodules Back: symmetric, no curvature. ROM normal. No CVA tenderness. Lungs: clear to auscultation bilaterally Heart: regular rate and rhythm, S1, S2 normal, no murmur, click, rub or gallop Abdomen: soft, non-tender; bowel sounds normal; no masses,  no organomegaly Pulses: 2+ and symmetric Skin: Skin color, texture, turgor normal. No rashes or lesions Lymph nodes: Cervical, supraclavicular, and axillary nodes normal.  Lab Results  Component Value Date   HGBA1C 6.4 01/08/2017   HGBA1C 6.4 10/08/2016   HGBA1C 10.4 07/07/2016    Lab Results  Component Value Date   CREATININE 0.49 12/18/2016   CREATININE 0.63 06/19/2016   CREATININE 0.73 06/05/2016    Lab Results  Component Value Date   WBC 10.6 (H) 12/18/2016   HGB 12.9 12/18/2016   HCT 39.1 12/18/2016   PLT 357 12/18/2016   GLUCOSE 170 (H) 12/18/2016   CHOL 124 (L) 06/19/2016   TRIG 211 (H) 06/19/2016   HDL 44 (L) 06/19/2016   LDLDIRECT 94.0 09/25/2015   LDLCALC 38 06/19/2016   ALT 16 12/18/2016   AST 18 12/18/2016   NA 141 12/18/2016   K 4.1 12/18/2016   CL 108 12/18/2016   CREATININE 0.49 12/18/2016   BUN 12 12/18/2016   CO2 24 12/18/2016   TSH 1.75 11/09/2016   HGBA1C 6.4 01/08/2017   MICROALBUR 1.9 06/19/2016    No results found.  Assessment & Plan:   Problem List Items Addressed This Visit    Carpal tunnel syndrome on right    By recent EMG/[place] studies      Relevant Medications   escitalopram (LEXAPRO) 5 MG tablet   Hyperlipidemia associated with type 2 diabetes mellitus ([place])    She is tolerating a high potensity statin without side effects.  LFTs are normal.   Lab Results  Component Value Date   CHOL 124 (L) 06/19/2016   HDL 44 (L) 06/19/2016    LDLCALC 38 06/19/2016   LDLDIRECT 94.0 09/25/2015   TRIG 211 (H) 06/19/2016   CHOLHDL 2.8 06/19/2016   Lab Results  Component Value Date   ALT 16 12/18/2016   AST 18 12/18/2016   ALKPHOS 88 12/18/2016   BILITOT 0.7 12/18/2016           Left knee pain    Urged to proceed with MRI and surgery now that her  Diabetes is well controlled        A total of 25 minutes of face to face time was spent with patient more than half of which was spent in counselling about the above mentioned conditions  and coordination  of care  I have discontinued Ms. Namba's doxycycline. I am also having her start on escitalopram. Additionally, I am having her maintain her nystatin, lidocaine, terbinafine, valACYclovir, atorvastatin, omeprazole, EASY TOUCH LANCING DEVICE, glucose blood, NOVOLIN R RELION, metFORMIN, gabapentin, insulin aspart, insulin NPH Human, naproxen, and traMADol.  Meds ordered this encounter  Medications  . traMADol (ULTRAM) 50 MG tablet    Sig: Take 1 tablet (50 mg total) by mouth every 6 (six) hours as needed. Maximum 3 daily    Dispense:  90 tablet    Refill:  2  . escitalopram (LEXAPRO) 5 MG tablet    Sig: Take 1 tablet (5 mg total) by mouth daily.    Dispense:  30 tablet    Refill:  5    Medications Discontinued During This Encounter  Medication Reason  . doxycycline (VIBRAMYCIN) 100 MG capsule Patient has not taken in last 30 days  . traMADol (ULTRAM) 50 MG tablet Reorder    Follow-up: No Follow-up on file.   Crecencio Mc, MD

## 2017-01-22 NOTE — Progress Notes (Signed)
Pre-visit discussion using our clinic review tool. No additional management support is needed unless otherwise documented below in the visit note.  

## 2017-01-23 NOTE — Assessment & Plan Note (Signed)
By recent EMG/[place] studies

## 2017-01-23 NOTE — Assessment & Plan Note (Signed)
She is tolerating a high potensity statin without side effects.  LFTs are normal.   Lab Results  Component Value Date   CHOL 124 (L) 06/19/2016   HDL 44 (L) 06/19/2016   LDLCALC 38 06/19/2016   LDLDIRECT 94.0 09/25/2015   TRIG 211 (H) 06/19/2016   CHOLHDL 2.8 06/19/2016   Lab Results  Component Value Date   ALT 16 12/18/2016   AST 18 12/18/2016   ALKPHOS 88 12/18/2016   BILITOT 0.7 12/18/2016

## 2017-01-23 NOTE — Assessment & Plan Note (Signed)
Urged to proceed with MRI and surgery now that her  Diabetes is well controlled

## 2017-01-30 ENCOUNTER — Other Ambulatory Visit: Payer: Self-pay | Admitting: Internal Medicine

## 2017-02-01 ENCOUNTER — Ambulatory Visit
Admission: RE | Admit: 2017-02-01 | Discharge: 2017-02-01 | Disposition: A | Discharge status: Home / Self Care | Payer: BC Managed Care – PPO | Source: Ambulatory Visit | Attending: Internal Medicine | Admitting: Internal Medicine

## 2017-02-01 ENCOUNTER — Telehealth: Payer: Self-pay | Admitting: Internal Medicine

## 2017-02-01 DIAGNOSIS — Z1239 Encounter for other screening for malignant neoplasm of breast: Secondary | ICD-10-CM

## 2017-02-01 NOTE — Telephone Encounter (Signed)
Pt called requesting a new rx for omeprazole (PRILOSEC) 40 MG capsule. Please advise, thank you!  [place], [place]  Call pt @ [phone]

## 2017-02-01 NOTE — Telephone Encounter (Signed)
Refill sent. See meds.  

## 2017-04-09 ENCOUNTER — Ambulatory Visit (INDEPENDENT_AMBULATORY_CARE_PROVIDER_SITE_OTHER): Payer: BC Managed Care – PPO | Admitting: Internal Medicine

## 2017-04-09 VITALS — BP 130/84 | HR 88 | Ht 64.0 in | Wt 187.0 lb

## 2017-04-09 DIAGNOSIS — E1165 Type 2 diabetes mellitus with hyperglycemia: Secondary | ICD-10-CM

## 2017-04-09 DIAGNOSIS — E113299 Type 2 diabetes mellitus with mild nonproliferative diabetic retinopathy without macular edema, unspecified eye: Secondary | ICD-10-CM | POA: Diagnosis not present

## 2017-04-09 DIAGNOSIS — Z794 Long term (current) use of insulin: Secondary | ICD-10-CM | POA: Diagnosis not present

## 2017-04-09 LAB — POCT GLYCOSYLATED HEMOGLOBIN (HGB A1C): Hemoglobin A1C: 6.8

## 2017-04-09 MED ORDER — GABAPENTIN 300 MG PO CAPS: 300.0000 mg | ORAL_CAPSULE | Freq: Two times a day (BID) | ORAL | 1 refills | Status: DC

## 2017-04-09 NOTE — Progress Notes (Signed)
Patient ID: [name], female   DOB: [birth year], 56 y.o.   MRN: [id number]  HPI: [name] is a 56 y.o.-year-old female, returning for f/u for DM2, dx ~2000, insulin-dependent since 2010, uncontrolled, with complications (PN, + DR). Last visit 3 mo ago.   She lost 6 lbs since last visit. However, she again started to miss insulin doses (does not inject R insulin for lunch) and injects different doses of NPH insulin than advised.... Also, no log today.  Last hemoglobin A1c was: Lab Results  Component Value Date   HGBA1C 6.4 01/08/2017   HGBA1C 6.4 10/08/2016   HGBA1C 10.4 07/07/2016   Pt is on:  Insulin Before breakfast Before lunch Before dinner  R 10 units with a smaller meal 15 units with a larger meal  20 units with a smaller meal 25 units with a regular meal 30 units with a larger meal  NPH 15 >> 10 - 45  - Metformin 1000 mg 2x a day   Pt is checking her sugars 1-3x a day:  - am:  200s (misses doses) >> 64, 100-193 >> 60, 83-187, 237, 271 >> 45 (3x), 110-120, 200s if skips doses - 2h after b'fast: 120-195 >> 154 >> n/c > 203 >> n/c - lunch:  47, 79 (more active at work) >> 47, 57, 60-167, 187 >> 130-140s - mid-afternoon:  40x1, 78-159, 167 >> n/c >> 106-149, 298 >> n/c - before dinner:60, 98-230, 255 >> n/c >> 46, 229 >> n/c >> ? - 2h after dinner: 72, 114-292 >> n/c >> 216, 219, 451 >> n/c  - bedtime:  158-255, 296 >> n/c >> up to 300 >> 94-164 >> 92-240 >> n/c - nighttime: 145-239, 338 >> n/c Lowest sugar was 36 x1 >> 60 >> 46 >> 48 >> 45; she has hypoglycemia awareness at 80. Highest sugar was  279 >> 200s.  - No CKD, last BUN/creatinine:  Lab Results  Component Value Date   BUN 12 12/18/2016   CREATININE 0.49 12/18/2016  Last ACR 0.5 06/19/13. Not on ACEI/ARB.  - last set of lipids: Lab Results  Component Value Date   CHOL 124 (L) 06/19/2016   HDL 44 (L) 06/19/2016   LDLCALC 38 06/19/2016   LDLDIRECT 94.0 09/25/2015   TRIG 211 (H) 06/19/2016   CHOLHDL 2.8 06/19/2016  She is on Lipitor.   - last eye exam was in 04/02/2017. + DR L>R. + glaucoma Dr Arnoldo Morale. Has drops >> improved. - + numbness and tingling in her feet. Seeing a podiatrist. On Neurontin 2 tabs a day  She also has a history of hypertension, hyperlipidemia-on Lipitor, episodic alcohol abuse, GERD, low back pain, osteoarthritis.   ROS: Constitutional: no weight gain/no weight loss, no fatigue, no subjective hyperthermia, no subjective hypothermia Eyes: no blurry vision, no xerophthalmia ENT: no sore throat, no nodules palpated in throat, no dysphagia, no odynophagia, no hoarseness Cardiovascular: no CP/no SOB/no palpitations/no leg swelling Respiratory: no cough/no SOB/no wheezing Gastrointestinal: no N/no V/no D/no C/no acid reflux Musculoskeletal: no muscle aches/no joint aches Skin: no rashes, no hair loss Neurological: no tremors/no numbness/no tingling/no dizziness  I reviewed pt's medications, allergies, PMH, social hx, family hx, and changes were documented in the history of present illness. Otherwise, unchanged from my initial visit note.  PE: BP 130/84 (BP Location: Left Arm, Patient Position: Sitting)   Pulse 88   Ht 5\' 4"  (1.626 m)   Wt 187 lb (84.8 kg)   SpO2 98%   BMI  32.10 kg/m  Body mass index is 32.1 kg/m. Wt Readings from Last 3 Encounters:  04/09/17 187 lb (84.8 kg)  01/22/17 193 lb 9.6 oz (87.8 kg)  01/08/17 190 lb (86.2 kg)   Constitutional: overweight, in NAD Eyes: PERRLA, EOMI, no exophthalmos ENT: moist mucous membranes, no thyromegaly, no cervical lymphadenopathy Cardiovascular: RRR, No MRG Respiratory: CTA B Gastrointestinal: abdomen soft, NT, ND, BS+ Musculoskeletal: no deformities, strength intact in all 4 Skin: moist, warm, no rashes Neurological: no tremor with outstretched hands, DTR normal in all 4  ASSESSMENT: 1. DM2, insulin-dependent, uncontrolled, with complications - peripheral neuropathy - DR   PLAN:  1.  Patient with long-standing,uncontrolled diabetes, on basal-bolus insulin regimen with ReliOn N and R insulin.  We again discussed about the importance of compliance >> she will start taking the insulin based on the below table >> add back R insulin with lunch. Also, I advised her to start documenting sugars >> bring log at next visit. - as she had several lows at night >> will reduce NPH dose at bedtime and increase the dose in am. - I advised her to continue:  Patient Instructions   Please change Insulin Before breakfast Before lunch Before dinner  Novolog 10 units with a smaller meal 15 units with a larger meal 10 units with a smaller meal 15 units with larger meal 20 units with a smaller meal 25 units with a regular meal  NPH 10 >> 15 - 45 >> 40    Continue Metformin 1000 mg 2x a day   Please come back for a follow-up appointment in 3 months with your log.  - today, HbA1c is 6.8%  - continue checking sugars at different times of the day - check 3x a day, rotating checks - advised for yearly eye exams >> she is due - Return to clinic in 3 mo with sugar log   Philemon Kingdom, MD PhD [facility] Endocrinology

## 2017-04-09 NOTE — Patient Instructions (Addendum)
Please change Insulin Before breakfast Before lunch Before dinner  Novolog 10 units with a smaller meal 15 units with a larger meal 10 units with a smaller meal 15 units with larger meal 20 units with a smaller meal 25 units with a regular meal  NPH 10 >> 15 - 45 >> 40    Continue Metformin 1000 mg 2x a day   Please come back for a follow-up appointment in 3 months with your log.

## 2017-04-12 ENCOUNTER — Encounter: Payer: Self-pay | Admitting: Internal Medicine

## 2017-04-30 ENCOUNTER — Encounter (HOSPITAL_COMMUNITY): Payer: Self-pay | Admitting: Emergency Medicine

## 2017-04-30 ENCOUNTER — Emergency Department (HOSPITAL_COMMUNITY)
Admission: EM | Admit: 2017-04-30 | Discharge: 2017-04-30 | Disposition: A | Discharge status: Home / Self Care | Payer: Worker's Compensation | Attending: Emergency Medicine | Admitting: Emergency Medicine

## 2017-04-30 ENCOUNTER — Emergency Department (HOSPITAL_COMMUNITY): Payer: Worker's Compensation

## 2017-04-30 ENCOUNTER — Other Ambulatory Visit: Payer: Self-pay

## 2017-04-30 DIAGNOSIS — Z87891 Personal history of nicotine dependence: Secondary | ICD-10-CM | POA: Diagnosis not present

## 2017-04-30 DIAGNOSIS — E119 Type 2 diabetes mellitus without complications: Secondary | ICD-10-CM | POA: Diagnosis not present

## 2017-04-30 DIAGNOSIS — Y999 Unspecified external cause status: Secondary | ICD-10-CM | POA: Diagnosis not present

## 2017-04-30 DIAGNOSIS — Z79899 Other long term (current) drug therapy: Secondary | ICD-10-CM | POA: Insufficient documentation

## 2017-04-30 DIAGNOSIS — M25512 Pain in left shoulder: Secondary | ICD-10-CM | POA: Insufficient documentation

## 2017-04-30 DIAGNOSIS — M79675 Pain in left toe(s): Secondary | ICD-10-CM | POA: Diagnosis not present

## 2017-04-30 DIAGNOSIS — W230XXA Caught, crushed, jammed, or pinched between moving objects, initial encounter: Secondary | ICD-10-CM | POA: Insufficient documentation

## 2017-04-30 DIAGNOSIS — M545 Low back pain, unspecified: Secondary | ICD-10-CM

## 2017-04-30 DIAGNOSIS — Z794 Long term (current) use of insulin: Secondary | ICD-10-CM | POA: Insufficient documentation

## 2017-04-30 DIAGNOSIS — I10 Essential (primary) hypertension: Secondary | ICD-10-CM | POA: Diagnosis not present

## 2017-04-30 DIAGNOSIS — Y929 Unspecified place or not applicable: Secondary | ICD-10-CM | POA: Insufficient documentation

## 2017-04-30 DIAGNOSIS — Y9301 Activity, walking, marching and hiking: Secondary | ICD-10-CM | POA: Diagnosis not present

## 2017-04-30 DIAGNOSIS — S99922A Unspecified injury of left foot, initial encounter: Secondary | ICD-10-CM | POA: Diagnosis present

## 2017-04-30 MED ORDER — CYCLOBENZAPRINE HCL 10 MG PO TABS
10.0000 mg | ORAL_TABLET | Freq: Once | ORAL | Status: AC
  Administered 2017-04-30: 10 mg via ORAL
  Filled 2017-04-30: qty 1

## 2017-04-30 MED ORDER — CYCLOBENZAPRINE HCL 10 MG PO TABS: 10.0000 mg | ORAL_TABLET | Freq: Every day | ORAL | 0 refills | Status: DC

## 2017-04-30 MED ORDER — CYCLOBENZAPRINE HCL 10 MG PO TABS: 10.0000 mg | ORAL_TABLET | Freq: Two times a day (BID) | ORAL | 0 refills | Status: DC | PRN

## 2017-04-30 NOTE — ED Provider Notes (Signed)
[facility] DEPT Provider Note   CSN: [id number] Arrival date & time: 04/30/17  1638  By signing my name below, I, Mayer Masker, attest that this documentation has been prepared under the direction and in the presence of [facility], PA-C. Electronically Signed: Mayer Masker, Scribe. 04/30/17. 5:52 PM.  History   Chief Complaint Chief Complaint  Patient presents with  . Arm Pain  . Foot Pain   The history is provided by the patient. No language interpreter was used.   HPI Comments: [name] is a 56 y.o. female with a PMHx of DM2 who presents to the Emergency Department complaining of constant, sharp pain in her left foot and big toe s/p someone opened a door on her left foot that occurred 9 hours ago. She states the severity of the pain waxes/wanes but the pain is constant. She denies dysuria, hematuria, changes to bladder and bowel function, abdominal pain, nausea, vomiting, and diarrhea.   She also complains of chronic dull, aching upper left arm, and lower back pain that began about 3 hours ago. She states it feels as if it is "catching". Lifting her arm makes the pain worse. She has tried using hot water and an unknown cream on her arm with no relief. She has associated numbness/tingling in her left hand. She denies injury or trauma to the area and weakness.    Past Medical History:  Diagnosis Date  . Arthritis   . Campylobacter enteritis May 2012   [facility] admission  . Carpal tunnel syndrome, bilateral   . Diabetes mellitus   . Gastritis with bleeding due to alcohol 2010   [facility] admission  . GERD (gastroesophageal reflux disease)   . Hyperlipidemia   . Hypertension    no meds  . Wears glasses     Patient Active Problem List   Diagnosis Date Noted  . Carpal tunnel syndrome on right 12/27/2016  . Polyarthralgia 11/09/2016  . Grief reaction 07/24/2016  . Uncontrolled type 2 diabetes mellitus with retinopathy, with long-term current use of insulin ([place]) 07/07/2016  .  Visit for preventive health examination 06/21/2016  . Increased urinary frequency 06/21/2016  . Chronic meniscal tear of knee 08/19/2015  . Left knee pain 06/19/2015  . Candidiasis of skin 06/18/2015  . Genital herpes 07/31/2014  . Furuncle of female breast 09/21/2013  . Ulnar nerve compression 09/21/2013  . Noncompliance with diet and medication regimen 11/15/2012  . Tenosynovitis of thumb 01/02/2012  . Arthritis   . Screening for breast cancer 08/04/2011  . Screening for colon cancer 08/04/2011  . HIDRADENITIS SUPPURATIVA 04/11/2007  . GERD 11/17/2006  . ENDOMETRIAL POLYP 11/17/2006  . LOW BACK PAIN 11/17/2006  . FIBROIDS, UTERUS 11/16/2006  . Hyperlipidemia associated with type 2 diabetes mellitus ([place]) 11/16/2006  . ALCOHOL ABUSE, EPISODIC 11/16/2006  . Essential hypertension 11/16/2006    Past Surgical History:  Procedure Laterality Date  . CARPAL TUNNEL RELEASE Left 05/14/2014   Procedure: LEFT ULNAR NEUROPLASTY AT ELBOW AND ENDOSCOPIC CARPAL TUNNEL RELEASE;  Surgeon: Jolyn Nap, MD;  Location: [facility];  Service: Orthopedics;  Laterality: Left;  . CYST EXCISION  1995   under arms  . endometrial biopsy  2004   benign  . FOOT OSTEOTOMY  2010   right-spurs  . TONSILLECTOMY    . ULNAR NERVE TRANSPOSITION Left 05/14/2014   Procedure: ULNAR NERVE DECOMPRESSION/TRANSPOSITION;  Surgeon: Jolyn Nap, MD;  Location: [place] [facility];  Service: Orthopedics;  Laterality: Left;  OB History    No data available       Home Medications    Prior to Admission medications   Medication Sig Start Date End Date Taking? Authorizing Provider  atorvastatin (LIPITOR) 40 MG tablet TAKE ONE TABLET BY MOUTH ONCE DAILY 07/22/16   Crecencio Mc, MD  cyclobenzaprine (FLEXERIL) 10 MG tablet Take 1 tablet (10 mg total) by mouth at bedtime. 04/30/17   Neamiah Sciarra A, PA-C  escitalopram (LEXAPRO) 5 MG tablet Take 1 tablet (5 mg total) by mouth daily. 01/22/17    Crecencio Mc, MD  gabapentin (NEURONTIN) 300 MG capsule Take 1 capsule (300 mg total) by mouth 2 (two) times daily. 04/09/17   Philemon Kingdom, MD  glucose blood (BAYER CONTOUR TEST) test strip TEST BLOOD SUGARS 3 TIMES DAILY 08/12/16   Philemon Kingdom, MD  insulin aspart (NOVOLOG) 100 UNIT/ML injection Inject into the skin 10-30 units before meals as advised 10/08/16   Philemon Kingdom, MD  insulin NPH Human (NOVOLIN N RELION) 100 UNIT/ML injection Inject 15 units under skin in the AM and 45 units in the evening 10/08/16   Philemon Kingdom, MD  Lancet Devices (EASY TOUCH LANCING DEVICE) MISC Use to check diabetes 4 times daily  E 11.65 08/12/16   Philemon Kingdom, MD  lidocaine (LIDODERM) 5 % Reported on 06/19/2016 01/28/16   [provider]  metFORMIN (GLUCOPHAGE) 1000 MG tablet TAKE ONE TABLET BY MOUTH TWICE DAILY WITH A MEAL 10/02/16   Crecencio Mc, MD  naproxen (NAPROSYN) 500 MG tablet Take 1 tablet (500 mg total) by mouth 2 (two) times daily. 12/18/16   Tanna Furry, MD  NOVOLIN R RELION 100 UNIT/ML injection INJECT UP TO 50 UNITS INTO THE SKIN DAILY AS DIRECTED 09/21/16   Philemon Kingdom, MD  nystatin (MYCOSTATIN/NYSTOP) 100000 UNIT/GM POWD Apply powder to affected area twice daily until rash has resolved 06/18/15   Crecencio Mc, MD  omeprazole (PRILOSEC) 40 MG capsule Take 1 capsule (40 mg total) by mouth daily. 07/22/16   Crecencio Mc, MD  omeprazole (PRILOSEC) 40 MG capsule TAKE ONE CAPSULE BY MOUTH ONCE DAILY 02/01/17   Crecencio Mc, MD  terbinafine (LAMISIL) 250 MG tablet Reported on 06/19/2016 04/01/16   [provider]  traMADol (ULTRAM) 50 MG tablet Take 1 tablet (50 mg total) by mouth every 6 (six) hours as needed. Maximum 3 daily 01/22/17   Crecencio Mc, MD  valACYclovir (VALTREX) 500 MG tablet Take 1 tablet (500 mg total) by mouth 3 (three) times daily. Reported on 06/19/2016 06/19/16   Crecencio Mc, MD    Family History Family History  Problem  Relation Age of Onset  . Hypertension Mother   . Stroke Father   . Diabetes Maternal Grandmother   . Hypertension Maternal Grandmother     Social History Social History  Substance Use Topics  . Smoking status: Former Smoker    Quit date: 05/09/2012  . Smokeless tobacco: Never Used  . Alcohol use No     Comment: not for 7 yr     Allergies   Oxycodone-acetaminophen and Penicillins   Review of Systems Review of Systems  Gastrointestinal: Negative for abdominal pain, blood in stool, diarrhea, nausea and vomiting.  Genitourinary: Negative for difficulty urinating, dysuria, frequency and hematuria.  Musculoskeletal: Positive for arthralgias, back pain and myalgias.  Neurological: Positive for numbness. Negative for weakness.  All other systems reviewed and are negative.    Physical Exam Updated Vital Signs BP Marland Kitchen)  150/104 (BP Location: Right Arm)   Pulse 92   Temp 98.2 F (36.8 C) (Oral)   Resp 18   Ht 5\' 4"  (1.626 m)   Wt 84.8 kg (187 lb)   SpO2 97%   BMI 32.10 kg/m   Physical Exam  Constitutional: She is oriented to person, place, and time. She appears well-developed and well-nourished.  HENT:  Head: Normocephalic and atraumatic.  Right Ear: External ear normal.  Left Ear: External ear normal.  Eyes: Conjunctivae are normal. Pupils are equal, round, and reactive to light. Right eye exhibits no discharge. Left eye exhibits no discharge.  Neck: Normal range of motion. Neck supple. No JVD present. No tracheal deviation present.  Cardiovascular: Normal rate, regular rhythm, normal heart sounds and intact distal pulses.   2+ radial and DP/PT pulses bl, negative Homan's bl   Pulmonary/Chest: Effort normal and breath sounds normal. She exhibits tenderness.  Left upper anterior chest wall mildly tender to palpation. No deformity, crepitus, or paradoxical motion of the chest wall noted  Abdominal: Soft. She exhibits no distension. There is no tenderness.  Musculoskeletal:  She exhibits tenderness.  No midline spine TTP. Left-sided paraspinal lumbar tenderness to palpation noted. No deformity, crepitus, or step-off noted. Full ROM of BUE, however pain is elicited with upward motion and abduction of the left shoulder. Negative crossover test. Positive Hawkins test. Negative empty can sign. There is a positive arc of pain with abduction of the arm. No tenderness to palpation of the deltoid or biceps. Left great toe mildly tender to palpation laterally. Normal capillary refill and strength of the EHL. No deformity, crepitus, or ecchymosis noted. 5/5 strength of BUE and BLE.   Neurological: She is alert and oriented to person, place, and time.  Fluent speech, no facial droop, sensation intact globally, normal gait, and patient able to heel walk and toe walk without difficulty. No pronator drift.   Skin: Skin is warm and dry. Capillary refill takes less than 2 seconds.  Psychiatric: She has a normal mood and affect. Her behavior is normal.  Nursing note and vitals reviewed.    ED Treatments / Results  DIAGNOSTIC STUDIES: Oxygen Saturation is 97% on RA, normal by my interpretation.    COORDINATION OF CARE: 5:49 PM Discussed treatment plan with pt at bedside and pt agreed to plan.  Labs (all labs ordered are listed, but only abnormal results are displayed) Labs Reviewed - No data to display  EKG  EKG Interpretation None       Radiology Dg Shoulder Left  Result Date: 04/30/2017 CLINICAL DATA:  Left shoulder pain for several weeks, no known injury, initial encounter EXAM: LEFT SHOULDER - 2+ VIEW COMPARISON:  None. FINDINGS: The humeral head is well located. The underlying rib cage is within normal limits. There is a well corticated bony density in the expected region of the acromion likely representing a previous acromion fracture with nonunion. No new fracture is seen. No soft tissue abnormality is noted. IMPRESSION: No acute abnormality noted. There are changes  suggestive of a previous acromial fracture with nonunion. Electronically Signed   By: Inez Catalina M.D.   On: 04/30/2017 18:29   Dg Toe Great Left  Result Date: 04/30/2017 CLINICAL DATA:  Caught first toe in door with pain and swelling, initial encounter EXAM: LEFT GREAT TOE COMPARISON:  None. FINDINGS: There is no evidence of fracture or dislocation. There is no evidence of arthropathy or other focal bone abnormality. Soft tissues are unremarkable. IMPRESSION: No acute  abnormality noted. Electronically Signed   By: Inez Catalina M.D.   On: 04/30/2017 18:30    Procedures Procedures (including critical care time)  Medications Ordered in ED Medications  cyclobenzaprine (FLEXERIL) tablet 10 mg (not administered)     Initial Impression / Assessment and Plan / ED Course  I have reviewed the triage vital signs and the nursing notes.  Pertinent labs & imaging results that were available during my care of the patient were reviewed by me and considered in my medical decision making (see chart for details).     Patient with left-sided arthralgias including shoulder and low back, and great toe. She is primarily concerned with her great toe and shoulder pain. Afebrile, hypertensive while in the ED, with improvement. Low evidence of CVA or intracranial process. X-rays negative for acute fracture or dislocation, although x-ray of left shoulder shows possible prior acromial fracture with nonunion. Physical examination of left shoulder suggestive of rotator cuff tear. Suspect left-sided low back pain is musculoskeletal in nature with a component of spasm. Right great toe without fracture. Discussed RICE therapy, and follow up with orthopedic for reevaluation of the shoulder, as well as low back and toe if symptoms persist. She was given Flexeril while in the ED with improvement in her symptoms. Given shoulder sling for comfort. Discussed indications for return to the ED. Pt verbalized understanding of and  agreement with plan and is safe for discharge home at this time.   Final Clinical Impressions(s) / ED Diagnoses   Final diagnoses:  Acute pain of left shoulder  Acute left-sided low back pain without sciatica  Pain of left great toe    New Prescriptions Current Discharge Medication List    START taking these medications   Details  cyclobenzaprine (FLEXERIL) 10 MG tablet Take 1 tablet (10 mg total) by mouth at bedtime. Qty: 10 tablet, Refills: 0      I personally performed the services described in this documentation, which was scribed in my presence. The recorded information has been reviewed and is accurate.     Renita Papa, PA-C 04/30/17 [place], [place], DO 04/30/17 [id number]

## 2017-04-30 NOTE — Telephone Encounter (Signed)
error 

## 2017-04-30 NOTE — ED Triage Notes (Signed)
Patient reports chronic left arm pain, unable to state when pain began. States earlier today patient had someone open a door and hit her left foot. No obvious deformity or swelling noted.

## 2017-04-30 NOTE — Discharge Instructions (Signed)
Alternate 600 mg ibuprofen and 500 mg Tylenol every 3 hours as needed for pain. Apply ice or heat to the affected areas for comfort. He may use her sling for comfort as well. You may take Flexeril at night, but if this makes you drowsy avoid driving, drinking alcohol, or operating heavy machinery. Follow-up with orthopedics for further evaluation of your shoulder pain. Return to the ED if any concerning symptoms develop.

## 2017-05-04 ENCOUNTER — Ambulatory Visit (INDEPENDENT_AMBULATORY_CARE_PROVIDER_SITE_OTHER): Payer: Self-pay | Admitting: Family

## 2017-05-04 ENCOUNTER — Encounter: Payer: Self-pay | Admitting: Family

## 2017-05-04 DIAGNOSIS — M25552 Pain in left hip: Secondary | ICD-10-CM

## 2017-05-04 NOTE — Progress Notes (Signed)
Subjective:    Patient ID: [name], female    DOB: Jul 24, 1961, 56 y.o.   MRN: [id number]  CC: DEEPIKA DECATUR is a 56 y.o. female who presents today for an acute visit.    HPI: Primary reason here today is for left lateral hip pain, 4 days ago,unchanged.   Onset correlated with door being pulled over left foot in which she was seen in ED.  Pain aggravated with walking. No pain in groin.  Not r/t to food.  No abdominal pain, fever, N, V. Last BM yesterday. Pain comes and goes. No pain right now. Describes chronic low back ache 'all  Over' of which she has followed with orthopedic for this. No numbness in legs, recent falls, h/o cancer.   After incident, left arm started to hurt. Door didn't arm. Worried that by hitting foot, bothered left arm. Left arm pain with movement. Feels better with sling from ED which is wearing now. Plans to see orthopedic tomorrow.   Pain in foot has improved. Ice with improvement.   No chest pain, SOB, palpitations  6/1 ed for foot pain when someone opened door on left foot at work. Also complained of left arm pain. Xrays negative for fracture, dislocation. Suggestive of rotator cuff tear. RICE advised.        UTD on colonoscopy ; goes every 10 years. Dr Derrel Nip 12/2016 for multiple complaints regarding neck, shoulders, knees Dr Tamala Julian 10/2016; referred to rheumatology for multiple MS complaints.    HISTORY:  Past Medical History:  Diagnosis Date  . Arthritis   . Campylobacter enteritis May 2012   [facility] admission  . Carpal tunnel syndrome, bilateral   . Diabetes mellitus   . Gastritis with bleeding due to alcohol 2010   [facility] admission  . GERD (gastroesophageal reflux disease)   . Hyperlipidemia   . Hypertension    no meds  . Wears glasses    Past Surgical History:  Procedure Laterality Date  . CARPAL TUNNEL RELEASE Left 05/14/2014   Procedure: LEFT ULNAR NEUROPLASTY AT ELBOW AND ENDOSCOPIC CARPAL TUNNEL RELEASE;  Surgeon: Jolyn Nap, MD;   Location: [facility];  Service: Orthopedics;  Laterality: Left;  . CYST EXCISION  1995   under arms  . endometrial biopsy  2004   benign  . FOOT OSTEOTOMY  2010   right-spurs  . TONSILLECTOMY    . ULNAR NERVE TRANSPOSITION Left 05/14/2014   Procedure: ULNAR NERVE DECOMPRESSION/TRANSPOSITION;  Surgeon: Jolyn Nap, MD;  Location: [place];  Service: Orthopedics;  Laterality: Left;   Family History  Problem Relation Age of Onset  . Hypertension Mother   . Stroke Father   . Diabetes Maternal Grandmother   . Hypertension Maternal Grandmother     Allergies: Oxycodone-acetaminophen and Penicillins Current Outpatient Prescriptions on File Prior to Visit  Medication Sig Dispense Refill  . atorvastatin (LIPITOR) 40 MG tablet TAKE ONE TABLET BY MOUTH ONCE DAILY 30 tablet 5  . cyclobenzaprine (FLEXERIL) 10 MG tablet Take 1 tablet (10 mg total) by mouth at bedtime. 10 tablet 0  . escitalopram (LEXAPRO) 5 MG tablet Take 1 tablet (5 mg total) by mouth daily. 30 tablet 5  . gabapentin (NEURONTIN) 300 MG capsule Take 1 capsule (300 mg total) by mouth 2 (two) times daily. 180 capsule 1  . glucose blood (BAYER CONTOUR TEST) test strip TEST BLOOD SUGARS 3 TIMES DAILY 200 each 11  . insulin aspart (NOVOLOG) 100 UNIT/ML injection Inject into  the skin 10-30 units before meals as advised 20 mL 2  . insulin NPH Human (NOVOLIN N RELION) 100 UNIT/ML injection Inject 15 units under skin in the AM and 45 units in the evening 30 mL 5  . Lancet Devices (EASY TOUCH LANCING DEVICE) MISC Use to check diabetes 4 times daily  E 11.65 1 each 11  . lidocaine (LIDODERM) 5 % Reported on 06/19/2016    . metFORMIN (GLUCOPHAGE) 1000 MG tablet TAKE ONE TABLET BY MOUTH TWICE DAILY WITH A MEAL 60 tablet 5  . naproxen (NAPROSYN) 500 MG tablet Take 1 tablet (500 mg total) by mouth 2 (two) times daily. 30 tablet 0  . NOVOLIN R RELION 100 UNIT/ML injection INJECT UP TO 50 UNITS INTO THE SKIN DAILY  AS DIRECTED 10 mL 5  . nystatin (MYCOSTATIN/NYSTOP) 100000 UNIT/GM POWD Apply powder to affected area twice daily until rash has resolved 15 g 3  . omeprazole (PRILOSEC) 40 MG capsule Take 1 capsule (40 mg total) by mouth daily. 30 capsule 5  . omeprazole (PRILOSEC) 40 MG capsule TAKE ONE CAPSULE BY MOUTH ONCE DAILY 30 capsule 3  . terbinafine (LAMISIL) 250 MG tablet Reported on 06/19/2016    . traMADol (ULTRAM) 50 MG tablet Take 1 tablet (50 mg total) by mouth every 6 (six) hours as needed. Maximum 3 daily 90 tablet 2  . valACYclovir (VALTREX) 500 MG tablet Take 1 tablet (500 mg total) by mouth 3 (three) times daily. Reported on 06/19/2016 21 tablet 3  . [DISCONTINUED] esomeprazole (NEXIUM) 40 MG capsule Take 40 mg by mouth daily.     No current facility-administered medications on file prior to visit.     Social History  Substance Use Topics  . Smoking status: Former Smoker    Quit date: 05/09/2012  . Smokeless tobacco: Never Used  . Alcohol use No     Comment: not for 7 yr    Review of Systems  Constitutional: Negative for chills and fever.  Respiratory: Negative for cough and shortness of breath.   Cardiovascular: Negative for chest pain and palpitations.  Gastrointestinal: Negative for abdominal distention, abdominal pain, nausea and vomiting.  Musculoskeletal: Positive for back pain (chronic). Negative for gait problem, joint swelling and neck pain.  Skin: Negative for rash and wound.  Neurological: Negative for numbness.      Objective:    BP (!) 144/86   Pulse 81   Temp 97.9 F (36.6 C) (Oral)   Ht 5\' 4"  (1.626 m)   Wt 187 lb 12.8 oz (85.2 kg)   SpO2 98%   BMI 32.24 kg/m    Physical Exam  Constitutional: She appears well-developed and well-nourished.  Eyes: Conjunctivae are normal.  Cardiovascular: Normal rate, regular rhythm, normal heart sounds and normal pulses.   Pulmonary/Chest: Effort normal and breath sounds normal. She has no wheezes. She has no rhonchi.  She has no rales.  Musculoskeletal:       Left hip: She exhibits tenderness. She exhibits normal range of motion, normal strength and no bony tenderness.       Lumbar back: She exhibits normal range of motion, no tenderness, no bony tenderness, no swelling, no edema, no pain and no spasm.       Legs: Full range of motion with flexion, tension, lateral side bends. No bony tenderness. No pain, numbness, tingling elicited with single leg raise bilaterally.   Left Hip: No limp or waddling gait. Full ROM with flexion and hip rotation in flexion.  Pain  of lateral hip with  (flexion-abduction-external rotation) test. Pain with deep palpation of greater trochanter.    Neurological: She is alert. She has normal strength. No sensory deficit.  Reflex Scores:      Patellar reflexes are 2+ on the right side and 2+ on the left side. Sensation and strength intact bilateral lower extremities.  Skin: Skin is warm and dry.  Psychiatric: She has a normal mood and affect. Her speech is normal and behavior is normal. Thought content normal.  Vitals reviewed.      Assessment & Plan:   Problem List Items Addressed This Visit      Other   Left hip pain    Exam consistent with lateral hip pain common with trochanteric bursitis. Complicated by h/o chronic low back pain. Advised conservative therapy with NSAIDs PRN, heat/ice. Advised follow up with orthopedics as may benefit from steroid injection and imaging if pain persists. Return precautions given.             I am having Ms. Hehn maintain her nystatin, lidocaine, terbinafine, valACYclovir, atorvastatin, omeprazole, EASY TOUCH LANCING DEVICE, glucose blood, NOVOLIN R RELION, metFORMIN, insulin aspart, insulin NPH Human, naproxen, traMADol, escitalopram, omeprazole, gabapentin, and cyclobenzaprine.   No orders of the defined types were placed in this encounter.   Return precautions given.   Risks, benefits, and alternatives of the medications  and treatment plan prescribed today were discussed, and patient expressed understanding.   Education regarding symptom management and diagnosis given to patient on AVS.  Continue to follow with Crecencio Mc, MD for routine health maintenance.   [name] and I agreed with plan.   Mable Paris, FNP

## 2017-05-04 NOTE — Patient Instructions (Addendum)
As discussed, I suspect left hip pain is osteoarthritic in nature. May be even related to trochanteric bursitis. Best therapy for this is alternating heat and ice, ontinued exercise, and over-the-counter ibuprofen WITH food as needed. Aspercreme OTC is also great.   You're seeing orthopedics tomorrow- other options include  joint injections. Other options include physical therapy as we discussed   Please let me know if no improvement

## 2017-05-04 NOTE — Progress Notes (Signed)
Pre visit review using our clinic review tool, if applicable. No additional management support is needed unless otherwise documented below in the visit note. 

## 2017-05-04 NOTE — Assessment & Plan Note (Addendum)
Exam consistent with lateral hip pain common with trochanteric bursitis. Complicated by h/o chronic low back pain. Advised conservative therapy with NSAIDs PRN, heat/ice. Advised follow up with orthopedics as may benefit from steroid injection and imaging if pain persists. Return precautions given.

## 2017-05-05 ENCOUNTER — Ambulatory Visit (INDEPENDENT_AMBULATORY_CARE_PROVIDER_SITE_OTHER): Payer: Worker's Compensation | Admitting: Orthopaedic Surgery

## 2017-05-05 DIAGNOSIS — S90112A Contusion of left great toe without damage to nail, initial encounter: Secondary | ICD-10-CM | POA: Diagnosis not present

## 2017-05-05 DIAGNOSIS — M25512 Pain in left shoulder: Secondary | ICD-10-CM | POA: Diagnosis not present

## 2017-05-05 MED ORDER — ETODOLAC 500 MG PO TABS: 500.0000 mg | ORAL_TABLET | Freq: Two times a day (BID) | ORAL | 0 refills | Status: DC

## 2017-05-05 NOTE — Progress Notes (Signed)
Office Visit Note   Patient: [name]           Date of Birth: 25-Mar-1961           MRN: [id number] Visit Date: 05/05/2017              Requested by: Crecencio Mc, MD [address] [place], [place] [address] PCP: Crecencio Mc, MD   Assessment & Plan: Visit Diagnoses:  1. Acute pain of left shoulder   2. Contusion of left great toe without damage to nail, initial encounter     Plan: Hopefully she can get over these injuries quickly given no findings of fracture dislocation or malalignment on x-rays in no significant findings on clinical exam. I will have her set ordered over 2 weeks to allow for healing and put her on Lodine as an anti-inflammatory. All questions recurs and answered. Although she has been in a sling of her stay out of the sling. We'll reevaluate her in 2 weeks. No x-rays are needed.  Follow-Up Instructions: Return in about 2 weeks (around 05/19/2017).   Orders:  No orders of the defined types were placed in this encounter.  Meds ordered this encounter  Medications  . etodolac (LODINE) 500 MG tablet    Sig: Take 1 tablet (500 mg total) by mouth 2 (two) times daily.    Dispense:  60 tablet    Refill:  0      Procedures: No procedures performed   Clinical Data: No additional findings.   Subjective: No chief complaint on file. The patient is a 56 year old who is sent here from [facility] to evaluate an acute injury to her left foot and potentially her left shoulder. He said that on 04/30/2017 a door was opened directly into her left great toe and slammed against that toe. She says the pain her toe shot all the way up her body into her shoulder. She was seen in the emergency room and had x-rays of her great toe and her shoulder. She is in the sling for shoulder and says that both her shoulder and her great toe are hurting. 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 She was given Flexeril by the emergency  room and told to take anti-inflammatories.  HPI  Review of Systems She currently denies any headache, chest pain, shortness of breath, fever, chills, nausea, vomiting.  Objective: Vital Signs: There were no vitals taken for this visit.  Physical Exam She is alert nor 3 in no acute distress Ortho Exam Examination of her left great toe shows no bruising or swelling. His no disruption to the nail with the skin. Her range of motion is entirely full. The great toe is ligamentous is stable at the IP and MTP joint. Her ankle exam is also normal. Her left shoulder shows no swelling or bruising. It is clinically well located. There is no pain at the acromioclavicular joint. She has full motion of the shoulder but is just painful to her. Specialty Comments:  No specialty comments available.  Imaging: No results found. X-rays independently reviewed on the canopy system of her right left shoulder and left great toe are normal. There is no acute findings or irregularities.  PMFS History: Patient Active Problem List   Diagnosis Date Noted  . Acute pain of left shoulder 05/05/2017  . Contusion of left great toe without damage to nail 05/05/2017  . Left hip pain 05/04/2017  . Carpal  tunnel syndrome on right 12/27/2016  . Polyarthralgia 11/09/2016  . Grief reaction 07/24/2016  . Uncontrolled type 2 diabetes mellitus with retinopathy, with long-term current use of insulin ([place]) 07/07/2016  . Visit for preventive health examination 06/21/2016  . Increased urinary frequency 06/21/2016  . Chronic meniscal tear of knee 08/19/2015  . Left knee pain 06/19/2015  . Candidiasis of skin 06/18/2015  . Genital herpes 07/31/2014  . Furuncle of female breast 09/21/2013  . Ulnar nerve compression 09/21/2013  . Noncompliance with diet and medication regimen 11/15/2012  . Tenosynovitis of thumb 01/02/2012  . Arthritis   . Screening for breast cancer 08/04/2011  . Screening for colon cancer 08/04/2011  .  HIDRADENITIS SUPPURATIVA 04/11/2007  . GERD 11/17/2006  . ENDOMETRIAL POLYP 11/17/2006  . LOW BACK PAIN 11/17/2006  . FIBROIDS, UTERUS 11/16/2006  . Hyperlipidemia associated with type 2 diabetes mellitus ([place]) 11/16/2006  . ALCOHOL ABUSE, EPISODIC 11/16/2006  . Essential hypertension 11/16/2006   Past Medical History:  Diagnosis Date  . Arthritis   . Campylobacter enteritis May 2012   [facility] admission  . Carpal tunnel syndrome, bilateral   . Diabetes mellitus   . Gastritis with bleeding due to alcohol 2010   [facility] admission  . GERD (gastroesophageal reflux disease)   . Hyperlipidemia   . Hypertension    no meds  . Wears glasses     Family History  Problem Relation Age of Onset  . Hypertension Mother   . Stroke Father   . Diabetes Maternal Grandmother   . Hypertension Maternal Grandmother     Past Surgical History:  Procedure Laterality Date  . CARPAL TUNNEL RELEASE Left 05/14/2014   Procedure: LEFT ULNAR NEUROPLASTY AT ELBOW AND ENDOSCOPIC CARPAL TUNNEL RELEASE;  Surgeon: Jolyn Nap, MD;  Location: [facility];  Service: Orthopedics;  Laterality: Left;  . CYST EXCISION  1995   under arms  . endometrial biopsy  2004   benign  . FOOT OSTEOTOMY  2010   right-spurs  . TONSILLECTOMY    . ULNAR NERVE TRANSPOSITION Left 05/14/2014   Procedure: ULNAR NERVE DECOMPRESSION/TRANSPOSITION;  Surgeon: Jolyn Nap, MD;  Location: [facility];  Service: Orthopedics;  Laterality: Left;   Social History   Occupational History  . Not on file.   Social History Main Topics  . Smoking status: Former Smoker    Quit date: 05/09/2012  . Smokeless tobacco: Never Used  . Alcohol use No     Comment: not for 7 yr  . Drug use: Yes    Types: Marijuana  . Sexual activity: Yes     Comment: same partner x 10 years

## 2017-05-19 ENCOUNTER — Encounter (INDEPENDENT_AMBULATORY_CARE_PROVIDER_SITE_OTHER): Payer: Self-pay | Admitting: Orthopaedic Surgery

## 2017-05-19 ENCOUNTER — Ambulatory Visit (INDEPENDENT_AMBULATORY_CARE_PROVIDER_SITE_OTHER): Payer: Worker's Compensation | Admitting: Orthopaedic Surgery

## 2017-05-19 DIAGNOSIS — M25562 Pain in left knee: Secondary | ICD-10-CM

## 2017-05-19 DIAGNOSIS — G8929 Other chronic pain: Secondary | ICD-10-CM | POA: Diagnosis not present

## 2017-05-19 DIAGNOSIS — M25512 Pain in left shoulder: Secondary | ICD-10-CM

## 2017-05-19 MED ORDER — METHYLPREDNISOLONE ACETATE 40 MG/ML IJ SUSP
40.0000 mg | INTRAMUSCULAR | Status: AC | PRN
  Administered 2017-05-19: 40 mg via INTRA_ARTICULAR

## 2017-05-19 MED ORDER — LIDOCAINE HCL 1 % IJ SOLN
3.0000 mL | INTRAMUSCULAR | Status: AC | PRN
  Administered 2017-05-19: 3 mL

## 2017-05-19 NOTE — Progress Notes (Signed)
   Procedure Note  Patient: [name]             Date of Birth: 05-07-1961           MRN: [id number]             Visit Date: 05/19/2017  Procedures: Visit Diagnoses: Chronic pain of left knee  Acute pain of left shoulder  Large Joint Inj Date/Time: 05/19/2017 8:16 PM Performed by: Mcarthur Rossetti Authorized by: Mcarthur Rossetti   Location:  Shoulder Site:  L subacromial bursa Ultrasound Guidance: No   Fluoroscopic Guidance: No   Arthrogram: No   Medications:  3 mL lidocaine 1 %; 40 mg methylPREDNISolone acetate 40 MG/ML

## 2017-05-19 NOTE — Progress Notes (Signed)
Office Visit Note   Patient: [name]           Date of Birth: 1961-02-26           MRN: [id number] Visit Date: 05/19/2017              Requested by: Crecencio Mc, MD [address] [place],  [address] PCP: Crecencio Mc, MD   Assessment & Plan: Visit Diagnoses:  1. Chronic pain of left knee   2. Acute pain of left shoulder     Plan: At this point due to the severity of her left shoulder pain we did recommend a steroid injection subacromial space which he tolerated well. I expanded the risk and benefits of this injection. She like to have this in her left knee as well however she is a diabetic and I like to see how she does 2 weeks from now to see if that is warranted for the left knee. Given the severity of her pain though she feels like she cannot go back to work at this time so we will keep her out of her next visit in 2 weeks.  Follow-Up Instructions: Return in about 2 weeks (around 06/02/2017).   Orders:  Orders Placed This Encounter  Procedures  . Large Joint Injection/Arthrocentesis   No orders of the defined types were placed in this encounter.     Procedures: No procedures performed   Clinical Data: No additional findings.   Subjective: Chief Complaint  Patient presents with  . Left Shoulder - Pain  . Left Foot - Pain  The patient is someone just saw recently due to a work-related injury. She had a door open into her foot hitting against her left toe when she sustained a contusion that toe with no fracture. She feels like she may have put her arm out to catch the door and her shoulders been hurting severely since then. We had x-rays of her toe and her shoulder which were normal. She continues to complain of significant pain of her left shoulder. Of note though she's had recent knee pain and swelling only take a look at her knee today as well.  HPI  Review of Systems She denies any fever, chills, nausea, vomiting, chest pain, source  of breath  Objective: Vital Signs: There were no vitals taken for this visit.  Physical Exam She is alert and oriented 3 and in no acute distress Ortho Exam Examination of her left shoulder shows no evidence of rotator cuff tear on exam. She has full range of motion of the shoulder but is very painful to her. Examination of her left knee does show a mild effusion and some slight warmth with painful range of motion. Specialty Comments:  No specialty comments available.  Imaging: No results found.   PMFS History: Patient Active Problem List   Diagnosis Date Noted  . Acute pain of left shoulder 05/05/2017  . Contusion of left great toe without damage to nail 05/05/2017  . Left hip pain 05/04/2017  . Carpal tunnel syndrome on right 12/27/2016  . Polyarthralgia 11/09/2016  . Grief reaction 07/24/2016  . Uncontrolled type 2 diabetes mellitus with retinopathy, with long-term current use of insulin ([place]) 07/07/2016  . Visit for preventive health examination 06/21/2016  . Increased urinary frequency 06/21/2016  . Chronic meniscal tear of knee 08/19/2015  . Left knee pain 06/19/2015  . Candidiasis of skin 06/18/2015  . Genital herpes 07/31/2014  . Furuncle of female  breast 09/21/2013  . Ulnar nerve compression 09/21/2013  . Noncompliance with diet and medication regimen 11/15/2012  . Tenosynovitis of thumb 01/02/2012  . Arthritis   . Screening for breast cancer 08/04/2011  . Screening for colon cancer 08/04/2011  . HIDRADENITIS SUPPURATIVA 04/11/2007  . GERD 11/17/2006  . ENDOMETRIAL POLYP 11/17/2006  . LOW BACK PAIN 11/17/2006  . FIBROIDS, UTERUS 11/16/2006  . Hyperlipidemia associated with type 2 diabetes mellitus ([place]) 11/16/2006  . ALCOHOL ABUSE, EPISODIC 11/16/2006  . Essential hypertension 11/16/2006   Past Medical History:  Diagnosis Date  . Arthritis   . Campylobacter enteritis May 2012   [facility] admission  . Carpal tunnel syndrome, bilateral   . Diabetes mellitus     . Gastritis with bleeding due to alcohol 2010   [facility] admission  . GERD (gastroesophageal reflux disease)   . Hyperlipidemia   . Hypertension    no meds  . Wears glasses     Family History  Problem Relation Age of Onset  . Hypertension Mother   . Stroke Father   . Diabetes Maternal Grandmother   . Hypertension Maternal Grandmother     Past Surgical History:  Procedure Laterality Date  . CARPAL TUNNEL RELEASE Left 05/14/2014   Procedure: LEFT ULNAR NEUROPLASTY AT ELBOW AND ENDOSCOPIC CARPAL TUNNEL RELEASE;  Surgeon: Jolyn Nap, MD;  Location: [place];  Service: Orthopedics;  Laterality: Left;  . CYST EXCISION  1995   under arms  . endometrial biopsy  2004   benign  . FOOT OSTEOTOMY  2010   right-spurs  . TONSILLECTOMY    . ULNAR NERVE TRANSPOSITION Left 05/14/2014   Procedure: ULNAR NERVE DECOMPRESSION/TRANSPOSITION;  Surgeon: Jolyn Nap, MD;  Location: [facility];  Service: Orthopedics;  Laterality: Left;   Social History   Occupational History  . Not on file.   Social History Main Topics  . Smoking status: Former Smoker    Quit date: 05/09/2012  . Smokeless tobacco: Never Used  . Alcohol use No     Comment: not for 7 yr  . Drug use: Yes    Types: Marijuana  . Sexual activity: Yes     Comment: same partner x 10 years

## 2017-05-24 ENCOUNTER — Telehealth (INDEPENDENT_AMBULATORY_CARE_PROVIDER_SITE_OTHER): Payer: Self-pay

## 2017-05-24 NOTE — Telephone Encounter (Signed)
Faxed the 2 June office notes from Dr. Ninfa Linden to [facility] per her request

## 2017-06-07 ENCOUNTER — Ambulatory Visit (INDEPENDENT_AMBULATORY_CARE_PROVIDER_SITE_OTHER): Payer: Worker's Compensation | Admitting: Orthopaedic Surgery

## 2017-06-07 ENCOUNTER — Other Ambulatory Visit (INDEPENDENT_AMBULATORY_CARE_PROVIDER_SITE_OTHER): Payer: Self-pay

## 2017-06-07 DIAGNOSIS — G8929 Other chronic pain: Secondary | ICD-10-CM

## 2017-06-07 DIAGNOSIS — S90112D Contusion of left great toe without damage to nail, subsequent encounter: Secondary | ICD-10-CM

## 2017-06-07 DIAGNOSIS — M25512 Pain in left shoulder: Principal | ICD-10-CM

## 2017-06-07 NOTE — Progress Notes (Signed)
The patient is continuing to follow-up from injury sustained in a work-related accident. Just over a month ago a door opened up into her left right toe causing her pain she says she had hold of the back the door with her shoulder and this aggravated her shoulder issue her left side. She's been out of work the last 4-5 weeks now. She says she has throbbing pain in her toe and her shoulder.  On exam examination of the left toe is entirely normal. Her range of motion is full is no bruising there is no ligamentous instability and no grinding at the MTP joint x-rays of the great toe of also show no fracture. Her left shoulder moves fluidly. Other than subjective pain the rotator cuff feels strong with abduction however she said the pain is quite severe and she has throbbing in both areas. We have already tried a steroid injection in her left shoulder as well.  I will still keep her out of work per her request so we can continue to workup her shoulder to see what else may be going on with her shoulder. At this point an MRI is warranted to rule out a rotator cuff tear left shoulder giving the pain that she is experiencing.

## 2017-06-15 ENCOUNTER — Other Ambulatory Visit: Payer: Self-pay

## 2017-06-18 ENCOUNTER — Other Ambulatory Visit: Payer: Self-pay | Admitting: Internal Medicine

## 2017-06-21 ENCOUNTER — Ambulatory Visit (INDEPENDENT_AMBULATORY_CARE_PROVIDER_SITE_OTHER): Payer: Worker's Compensation | Admitting: Orthopaedic Surgery

## 2017-06-21 DIAGNOSIS — S90112D Contusion of left great toe without damage to nail, subsequent encounter: Secondary | ICD-10-CM | POA: Diagnosis not present

## 2017-06-21 DIAGNOSIS — M25512 Pain in left shoulder: Secondary | ICD-10-CM | POA: Diagnosis not present

## 2017-06-21 NOTE — Progress Notes (Signed)
The patient is continue return for follow-up after work-related accident. A door was opened up into her left great toe. There is no evidence of fracture of the great toe and is never been any evidence of damage to the soft tissues. After she saw [facility] for toe her shoulder was hurting her quite a bit the left side and she said she fell back either against a door with a door [place] stricture shoulder. Her x-rays of her shoulder been normal as well as her physical exam are then pain. We have attempted to get an MRI of the shoulder due to continued pain but this was denied. She says that although she hurts used feels like she can go back to work.  On examination of her left shoulder, her shoulder moves fluidly without blocks to motion. The shoulders well located. She has 5 out of 5 strength of the rotator cuff with just some give way strength due to pain. There is no swelling or redness. I can easily move her shoulder fluidly without a disruption to the shoulder. Her left great toe exam is also normal.  At this point I've encouraged her to get back to work and we will give her a note to reflect that. I see no disability permanently as a relates to her injury to her left great toe or her left shoulder. Obviously if she continues to have left shoulder pain we can attempt again to obtain an MRI of the shoulder. Just based alignment on the mechanism of injury I do not feel that damage was done to this left shoulder from the accident with that was described.

## 2017-06-23 ENCOUNTER — Ambulatory Visit (INDEPENDENT_AMBULATORY_CARE_PROVIDER_SITE_OTHER): Payer: BC Managed Care – PPO | Admitting: Internal Medicine

## 2017-06-23 ENCOUNTER — Encounter: Payer: Self-pay | Admitting: Internal Medicine

## 2017-06-23 VITALS — BP 126/70 | HR 86 | Temp 98.2°F | Resp 16 | Ht 64.0 in | Wt 189.4 lb

## 2017-06-23 DIAGNOSIS — I10 Essential (primary) hypertension: Secondary | ICD-10-CM | POA: Diagnosis not present

## 2017-06-23 DIAGNOSIS — R5383 Other fatigue: Secondary | ICD-10-CM | POA: Diagnosis not present

## 2017-06-23 DIAGNOSIS — E1169 Type 2 diabetes mellitus with other specified complication: Secondary | ICD-10-CM | POA: Diagnosis not present

## 2017-06-23 DIAGNOSIS — Z794 Long term (current) use of insulin: Secondary | ICD-10-CM | POA: Diagnosis not present

## 2017-06-23 DIAGNOSIS — M23204 Derangement of unspecified medial meniscus due to old tear or injury, left knee: Secondary | ICD-10-CM

## 2017-06-23 DIAGNOSIS — G8929 Other chronic pain: Secondary | ICD-10-CM | POA: Diagnosis not present

## 2017-06-23 DIAGNOSIS — M545 Low back pain: Secondary | ICD-10-CM | POA: Diagnosis not present

## 2017-06-23 DIAGNOSIS — R110 Nausea: Secondary | ICD-10-CM

## 2017-06-23 DIAGNOSIS — E1165 Type 2 diabetes mellitus with hyperglycemia: Secondary | ICD-10-CM | POA: Diagnosis not present

## 2017-06-23 DIAGNOSIS — E113299 Type 2 diabetes mellitus with mild nonproliferative diabetic retinopathy without macular edema, unspecified eye: Secondary | ICD-10-CM

## 2017-06-23 DIAGNOSIS — E785 Hyperlipidemia, unspecified: Secondary | ICD-10-CM

## 2017-06-23 MED ORDER — OMEPRAZOLE 40 MG PO CPDR: 40.0000 mg | DELAYED_RELEASE_CAPSULE | Freq: Every day | ORAL | 3 refills | Status: DC

## 2017-06-23 MED ORDER — CYCLOBENZAPRINE HCL 10 MG PO TABS: 10.0000 mg | ORAL_TABLET | Freq: Every day | ORAL | 5 refills | Status: DC

## 2017-06-23 MED ORDER — GABAPENTIN 300 MG PO CAPS: 300.0000 mg | ORAL_CAPSULE | Freq: Two times a day (BID) | ORAL | 1 refills | Status: DC

## 2017-06-23 MED ORDER — TRAMADOL HCL 50 MG PO TABS: 50.0000 mg | ORAL_TABLET | Freq: Four times a day (QID) | ORAL | 2 refills | Status: DC | PRN

## 2017-06-23 MED ORDER — OMEPRAZOLE 40 MG PO CPDR: 40.0000 mg | DELAYED_RELEASE_CAPSULE | Freq: Every day | ORAL | 5 refills | Status: DC

## 2017-06-23 NOTE — Patient Instructions (Signed)
Stop the Lodine .  Your nausera may be from gastritis  Start taking omeprazole 40 mg daily in the morning for gastritis  You can use up to 3 tramadol daily along with gabapentin and flexeril for your pain   STOP SKIPPING MEALS!  TAKE YOUR PROTEIN BARS WITH YOU TO WORK AND START USING THEM TO PREVENT LOW BLOOD SUGARS   Hypoglycemia Hypoglycemia is when the sugar (glucose) level in the blood is too low. Symptoms of low blood sugar may include:  Feeling: ? Hungry. ? Worried or nervous (anxious). ? Sweaty and clammy. ? Confused. ? Dizzy. ? Sleepy. ? Sick to your stomach (nauseous).  Having: ? A fast heartbeat. ? A headache. ? A change in your vision. ? Jerky movements that you cannot control (seizure). ? Nightmares. ? Tingling or no feeling (numbness) around the mouth, lips, or tongue.  Having trouble with: ? Talking. ? Paying attention (concentrating). ? Moving (coordination). ? Sleeping.  Shaking.  Passing out (fainting).  Getting upset easily (irritability).  Low blood sugar can happen to people who have diabetes and people who do not have diabetes. Low blood sugar can happen quickly, and it can be an emergency. Treating Low Blood Sugar Low blood sugar is often treated by eating or drinking something sugary right away. If you can think clearly and swallow safely, follow the 15:15 rule:  Take 15 grams of a fast-acting carb (carbohydrate). Some fast-acting carbs are: ? 1 tube of glucose gel. ? 3 sugar tablets (glucose pills). ? 6-8 pieces of hard candy. ? 4 oz (120 mL) of fruit juice. ? 4 oz (120 mL) of regular (not diet) soda.  Check your blood sugar 15 minutes after you take the carb.  If your blood sugar is still at or below 70 mg/dL (3.9 mmol/L), take 15 grams of a carb again.  If your blood sugar does not go above 70 mg/dL (3.9 mmol/L) after 3 tries, get help right away.  After your blood sugar goes back to normal, eat a meal or a snack within 1  hour.  Treating Very Low Blood Sugar If your blood sugar is at or below 54 mg/dL (3 mmol/L), you have very low blood sugar (severe hypoglycemia). This is an emergency. Do not wait to see if the symptoms will go away. Get medical help right away. Call your local emergency services (911 in the [place] Do not drive yourself to the hospital. If you have very low blood sugar and you cannot eat or drink, you may need a glucagon shot (injection). A family member or friend should learn how to check your blood sugar and how to give you a glucagon shot. Ask your doctor if you need to have a glucagon shot kit at home. Follow these instructions at home: General instructions  Avoid any diets that cause you to not eat enough food. Talk with your doctor before you start any new diet.  Take over-the-counter and prescription medicines only as told by your doctor.  Limit alcohol to no more than 1 drink per day for nonpregnant women and 2 drinks per day for men. One drink equals 12 oz of beer, 5 oz of wine, or 1 oz of hard liquor.  Keep all follow-up visits as told by your doctor. This is important. If You Have Diabetes:   Make sure you know the symptoms of low blood sugar.  Always keep a source of sugar with you, such as: ? Sugar. ? Sugar tablets. ? Glucose gel. ?  Fruit juice. ? Regular soda (not diet soda). ? Milk. ? Hard candy. ? Honey.  Take your medicines as told.  Follow your exercise and meal plan. ? Eat on time. Do not skip meals. ? Follow your sick day plan when you cannot eat or drink normally. Make this plan ahead of time with your doctor.  Check your blood sugar as often as told by your doctor. Always check before and after exercise.  Share your diabetes care plan with: ? Your work or school. ? People you live with.  Check your pee (urine) for ketones: ? When you are sick. ? As told by your doctor.  Carry a card or wear jewelry that says you have diabetes. If You Have Low  Blood Sugar From Other Causes:   Check your blood sugar as often as told by your doctor.  Follow instructions from your doctor about what you cannot eat or drink. Contact a doctor if:  You have trouble keeping your blood sugar in your target range.  You have low blood sugar often. Get help right away if:  You still have symptoms after you eat or drink something sugary.  Your blood sugar is at or below 54 mg/dL (3 mmol/L).  You have jerky movements that you cannot control.  You pass out. These symptoms may be an emergency. Do not wait to see if the symptoms will go away. Get medical help right away. Call your local emergency services (911 in the [place] Do not drive yourself to the hospital. This information is not intended to replace advice given to you by your health care provider. Make sure you discuss any questions you have with your health care provider. Document Released: 02/10/2010 Document Revised: 04/23/2016 Document Reviewed: 12/20/2015 Elsevier Interactive Patient Education  [employer].

## 2017-06-23 NOTE — Progress Notes (Signed)
Subjective:  Patient ID: [name], female    DOB: May 18, 1961  Age: 56 y.o. MRN: [id number]  CC: The primary encounter diagnosis was Hyperlipidemia associated with type 2 diabetes mellitus ([place]). Diagnoses of Essential hypertension, Uncontrolled type 2 diabetes mellitus with mild nonproliferative retinopathy, with long-term current use of insulin, macular edema presence unspecified, unspecified laterality ([place]), Fatigue, unspecified type, Nausea, Old tear of medial meniscus of left knee, unspecified tear type, and Chronic midline low back pain, with sciatica presence unspecified were also pertinent to this visit.  HPI LAITYN BENSEN presents for follow up on hypertension, hyperlipidemia and chronic joint (knee and hip)   and wrist pain with history of CTS,   Saw MA for heft hip pain ,  NSAIDs and flexeril advised for OA  /trochanteric bursitis advised to see ortho for hip injection if needed   Lab Results  Component Value Date   MICROALBUR <0.7 06/23/2017     Last eye exam up to date per patient.  Hs retinopathy  Saw Ninfa Linden  On June 6 for workmens' comp injury  to left foot and left shoulder  No fractures,  Saw him on June 20th for left knee pain  And left shoulder received steorid inejctio nin left shoulder  Told to return Two weeks later for  steroid s to left knee but this was not done.  Yet,  Has some fluid in the medial portion of left knee.   Having back spasms involving the left side of thoracic spine.   hAVING LOW BLOOD SUGARS IN THE MORNING TO 72 THIS MORNING HAS NOT CONTACTED DR GHERGE EVEN WHEN IT WAS 40 .    Has drops while sleeping ,  Drops   Says she is taking 15 units of N (CLOUDY)  and 20  units of Regular  ("CLEAR")  In the am  In the evening 20 units of R and 25 units of N  Skips meals often while working       Outpatient Medications Prior to Visit  Medication Sig Dispense Refill  . atorvastatin (LIPITOR) 40 MG tablet TAKE ONE TABLET BY MOUTH ONCE  DAILY 30 tablet 5  . glucose blood (BAYER CONTOUR TEST) test strip TEST BLOOD SUGARS 3 TIMES DAILY 200 each 11  . insulin aspart (NOVOLOG) 100 UNIT/ML injection Inject into the skin 10-30 units before meals as advised 20 mL 2  . insulin NPH Human (NOVOLIN N RELION) 100 UNIT/ML injection Inject 15 units under skin in the AM and 45 units in the evening 30 mL 5  . [employer] (EASY TOUCH LANCING DEVICE) MISC Use to check diabetes 4 times daily  E 11.65 1 each 11  . lidocaine (LIDODERM) 5 % Reported on 06/19/2016    . metFORMIN (GLUCOPHAGE) 1000 MG tablet TAKE ONE TABLET BY MOUTH TWICE DAILY WITH A MEAL 60 tablet 5  . NOVOLIN R RELION 100 UNIT/ML injection INJECT UP TO 50 UNITS SUBCUTANEOUSLY ONCE DAILY AS DIRECTED 10 mL 5  . nystatin (MYCOSTATIN/NYSTOP) 100000 UNIT/GM POWD Apply powder to affected area twice daily until rash has resolved 15 g 3  . cyclobenzaprine (FLEXERIL) 10 MG tablet Take 1 tablet (10 mg total) by mouth at bedtime. 10 tablet 0  . etodolac (LODINE) 500 MG tablet Take 1 tablet (500 mg total) by mouth 2 (two) times daily. 60 tablet 0  . gabapentin (NEURONTIN) 300 MG capsule Take 1 capsule (300 mg total) by mouth 2 (two) times daily. 180 capsule 1  . naproxen (  NAPROSYN) 500 MG tablet Take 1 tablet (500 mg total) by mouth 2 (two) times daily. 30 tablet 0  . omeprazole (PRILOSEC) 40 MG capsule Take 1 capsule (40 mg total) by mouth daily. 30 capsule 5  . escitalopram (LEXAPRO) 5 MG tablet Take 1 tablet (5 mg total) by mouth daily. (Patient not taking: Reported on 06/23/2017) 30 tablet 5  . omeprazole (PRILOSEC) 40 MG capsule TAKE 1 CAPSULE BY MOUTH ONCE DAILY (Patient not taking: Reported on 06/23/2017) 30 capsule 3  . terbinafine (LAMISIL) 250 MG tablet Reported on 06/19/2016    . traMADol (ULTRAM) 50 MG tablet Take 1 tablet (50 mg total) by mouth every 6 (six) hours as needed. Maximum 3 daily (Patient not taking: Reported on 06/23/2017) 90 tablet 2  . valACYclovir (VALTREX) 500 MG tablet  Take 1 tablet (500 mg total) by mouth 3 (three) times daily. Reported on 06/19/2016 (Patient not taking: Reported on 06/23/2017) 21 tablet 3   No facility-administered medications prior to visit.     Review of Systems;  Patient denies headache, fevers, malaise, unintentional weight loss, skin rash, eye pain, sinus congestion and sinus pain, sore throat, dysphagia,  hemoptysis , cough, dyspnea, wheezing, chest pain, palpitations, orthopnea, edema, abdominal pain, nausea, melena, diarrhea, constipation, flank pain, dysuria, hematuria, urinary  Frequency, nocturia, numbness, tingling, seizures,  Focal weakness, Loss of consciousness,  Tremor, insomnia, depression, anxiety, and suicidal ideation.      Objective:  BP 126/70 (BP Location: Left Arm, Patient Position: Sitting, Cuff Size: Normal)   Pulse 86   Temp 98.2 F (36.8 C) (Oral)   Resp 16   Ht '5\' 4"'$  (1.626 m)   Wt 189 lb 6.4 oz (85.9 kg)   SpO2 98%   BMI 32.51 kg/m   BP Readings from Last 3 Encounters:  06/23/17 126/70  05/04/17 (!) 144/86  04/30/17 (!) 164/96    Wt Readings from Last 3 Encounters:  06/23/17 189 lb 6.4 oz (85.9 kg)  05/04/17 187 lb 12.8 oz (85.2 kg)  04/30/17 187 lb (84.8 kg)    General appearance: alert, cooperative and appears stated age Ears: normal TM's and external ear canals both ears Throat: lips, mucosa, and tongue normal; teeth and gums normal Neck: no adenopathy, no carotid bruit, supple, symmetrical, trachea midline and thyroid not enlarged, symmetric, no tenderness/mass/nodules Back: symmetric, no curvature. ROM normal. No CVA tenderness. Lungs: clear to auscultation bilaterally Heart: regular rate and rhythm, S1, S2 normal, no murmur, click, rub or gallop Abdomen: soft, non-tender; bowel sounds normal; no masses,  no organomegaly Pulses: 2+ and symmetric Skin: Skin color, texture, turgor normal. No rashes or lesions Lymph nodes: Cervical, supraclavicular, and axillary nodes normal.  Lab  Results  Component Value Date   HGBA1C 6.8 04/09/2017   HGBA1C 6.4 01/08/2017   HGBA1C 6.4 10/08/2016    Lab Results  Component Value Date   CREATININE 0.49 12/18/2016   CREATININE 0.63 06/19/2016   CREATININE 0.73 06/05/2016    Lab Results  Component Value Date   WBC 10.6 (H) 12/18/2016   HGB 12.9 12/18/2016   HCT 39.1 12/18/2016   PLT 357 12/18/2016   GLUCOSE 170 (H) 12/18/2016   CHOL 124 (L) 06/19/2016   TRIG 211 (H) 06/19/2016   HDL 44 (L) 06/19/2016   LDLDIRECT 94.0 09/25/2015   LDLCALC 38 06/19/2016   ALT 16 12/18/2016   AST 18 12/18/2016   NA 141 12/18/2016   K 4.1 12/18/2016   CL 108 12/18/2016   CREATININE 0.49 12/18/2016  BUN 12 12/18/2016   CO2 24 12/18/2016   TSH 1.75 11/09/2016   HGBA1C 6.8 04/09/2017   MICROALBUR <0.7 06/23/2017    Dg Shoulder Left  Result Date: 04/30/2017 CLINICAL DATA:  Left shoulder pain for several weeks, no known injury, initial encounter EXAM: LEFT SHOULDER - 2+ VIEW COMPARISON:  None. FINDINGS: The humeral head is well located. The underlying rib cage is within normal limits. There is a well corticated bony density in the expected region of the acromion likely representing a previous acromion fracture with nonunion. No new fracture is seen. No soft tissue abnormality is noted. IMPRESSION: No acute abnormality noted. There are changes suggestive of a previous acromial fracture with nonunion. Electronically Signed   By: Alcide Clever M.D.   On: 04/30/2017 18:29   Dg Toe Great Left  Result Date: 04/30/2017 CLINICAL DATA:  Caught first toe in door with pain and swelling, initial encounter EXAM: LEFT GREAT TOE COMPARISON:  None. FINDINGS: There is no evidence of fracture or dislocation. There is no evidence of arthropathy or other focal bone abnormality. Soft tissues are unremarkable. IMPRESSION: No acute abnormality noted. Electronically Signed   By: Alcide Clever M.D.   On: 04/30/2017 18:30    Assessment & Plan:   Problem List Items  Addressed This Visit    Chronic meniscal tear of knee    Advised to return to orthopedics. For how,  Continue pain management with tramadol.  lodine stopped due to concern for gastritis.       Essential hypertension   Relevant Orders   Comp Met (CMET)   Hyperlipidemia associated with type 2 diabetes mellitus (HCC) - Primary   Relevant Orders   Lipid panel   LOW BACK PAIN    Continue gabapentin, tramadol and flexeril.       Relevant Medications   traMADol (ULTRAM) 50 MG tablet   cyclobenzaprine (FLEXERIL) 10 MG tablet   Nausea    Suspected to be NSAID induced gatritis.  Advised to stop Lodine.  PPI prescribed      Uncontrolled type 2 diabetes mellitus with retinopathy, with long-term current use of insulin (HCC)    She continues to have hypoglycemic events because she is skipping meals repeatedly.  Advice given, no changes today.       Relevant Orders   Microalbumin / creatinine urine ratio (Completed)   CBC w/Diff    Other Visit Diagnoses    Fatigue, unspecified type       Relevant Orders   CBC w/Diff     A total of 25 minutes of face to face time was spent with patient more than half of which was spent in counselling about the above mentioned conditions  and coordination of care   I have discontinued Ms. Pelham's terbinafine, valACYclovir, naproxen, escitalopram, and etodolac. I am also having her start on omeprazole. Additionally, I am having her maintain her nystatin, lidocaine, atorvastatin, EASY TOUCH LANCING DEVICE, glucose blood, metFORMIN, insulin aspart, insulin NPH Human, NOVOLIN R RELION, gabapentin, omeprazole, traMADol, and cyclobenzaprine.  Meds ordered this encounter  Medications  . DISCONTD: cyclobenzaprine (FLEXERIL) 10 MG tablet    Sig: Take 1 tablet (10 mg total) by mouth at bedtime.    Dispense:  30 tablet    Refill:  5  . gabapentin (NEURONTIN) 300 MG capsule    Sig: Take 1 capsule (300 mg total) by mouth 2 (two) times daily.    Dispense:  180  capsule    Refill:  1  .  omeprazole (PRILOSEC) 40 MG capsule    Sig: Take 1 capsule (40 mg total) by mouth daily.    Dispense:  30 capsule    Refill:  3  . omeprazole (PRILOSEC) 40 MG capsule    Sig: Take 1 capsule (40 mg total) by mouth daily.    Dispense:  30 capsule    Refill:  5  . traMADol (ULTRAM) 50 MG tablet    Sig: Take 1 tablet (50 mg total) by mouth every 6 (six) hours as needed. Maximum 3 daily    Dispense:  90 tablet    Refill:  2  . cyclobenzaprine (FLEXERIL) 10 MG tablet    Sig: Take 1 tablet (10 mg total) by mouth at bedtime.    Dispense:  30 tablet    Refill:  5    Medications Discontinued During This Encounter  Medication Reason  . omeprazole (PRILOSEC) 40 MG capsule Duplicate  . terbinafine (LAMISIL) 250 MG tablet Patient has not taken in last 30 days  . valACYclovir (VALTREX) 500 MG tablet Patient has not taken in last 30 days  . cyclobenzaprine (FLEXERIL) 10 MG tablet Reorder  . gabapentin (NEURONTIN) 300 MG capsule Reorder  . naproxen (NAPROSYN) 500 MG tablet   . etodolac (LODINE) 500 MG tablet   . omeprazole (PRILOSEC) 40 MG capsule Reorder  . traMADol (ULTRAM) 50 MG tablet Reorder  . escitalopram (LEXAPRO) 5 MG tablet   . cyclobenzaprine (FLEXERIL) 10 MG tablet Reorder    Follow-up: Return in about 6 months (around 12/24/2017).   Crecencio Mc, MD

## 2017-06-24 LAB — MICROALBUMIN / CREATININE URINE RATIO
CREATININE, U: 45.1 mg/dL
Microalb Creat Ratio: 1.6 mg/g (ref 0.0–30.0)

## 2017-06-26 NOTE — Assessment & Plan Note (Addendum)
She continues to have hypoglycemic events because she is skipping meals repeatedly.  Advice given, no changes today.

## 2017-06-26 NOTE — Assessment & Plan Note (Signed)
Advised to return to orthopedics. For how,  Continue pain management with tramadol.  lodine stopped due to concern for gastritis.

## 2017-06-26 NOTE — Assessment & Plan Note (Addendum)
Suspected to be NSAID induced gatritis.  Advised to stop Lodine.  PPI prescribed

## 2017-06-26 NOTE — Assessment & Plan Note (Signed)
Continue gabapentin, tramadol and flexeril.  

## 2017-06-30 ENCOUNTER — Other Ambulatory Visit: Payer: BC Managed Care – PPO

## 2017-06-30 NOTE — Addendum Note (Signed)
Addended by: Leeanne Rio on: 06/30/2017 04:04 PM   Modules accepted: Orders

## 2017-07-04 IMAGING — CR DG SHOULDER 2+V*L*
3 series · 3 of 3 positions shown · non-contrast
Comparison: None.

CLINICAL DATA: Left shoulder pain for several weeks, no known
injury, initial encounter

EXAM:
LEFT SHOULDER - 2+ VIEW

[w shoulder external left]
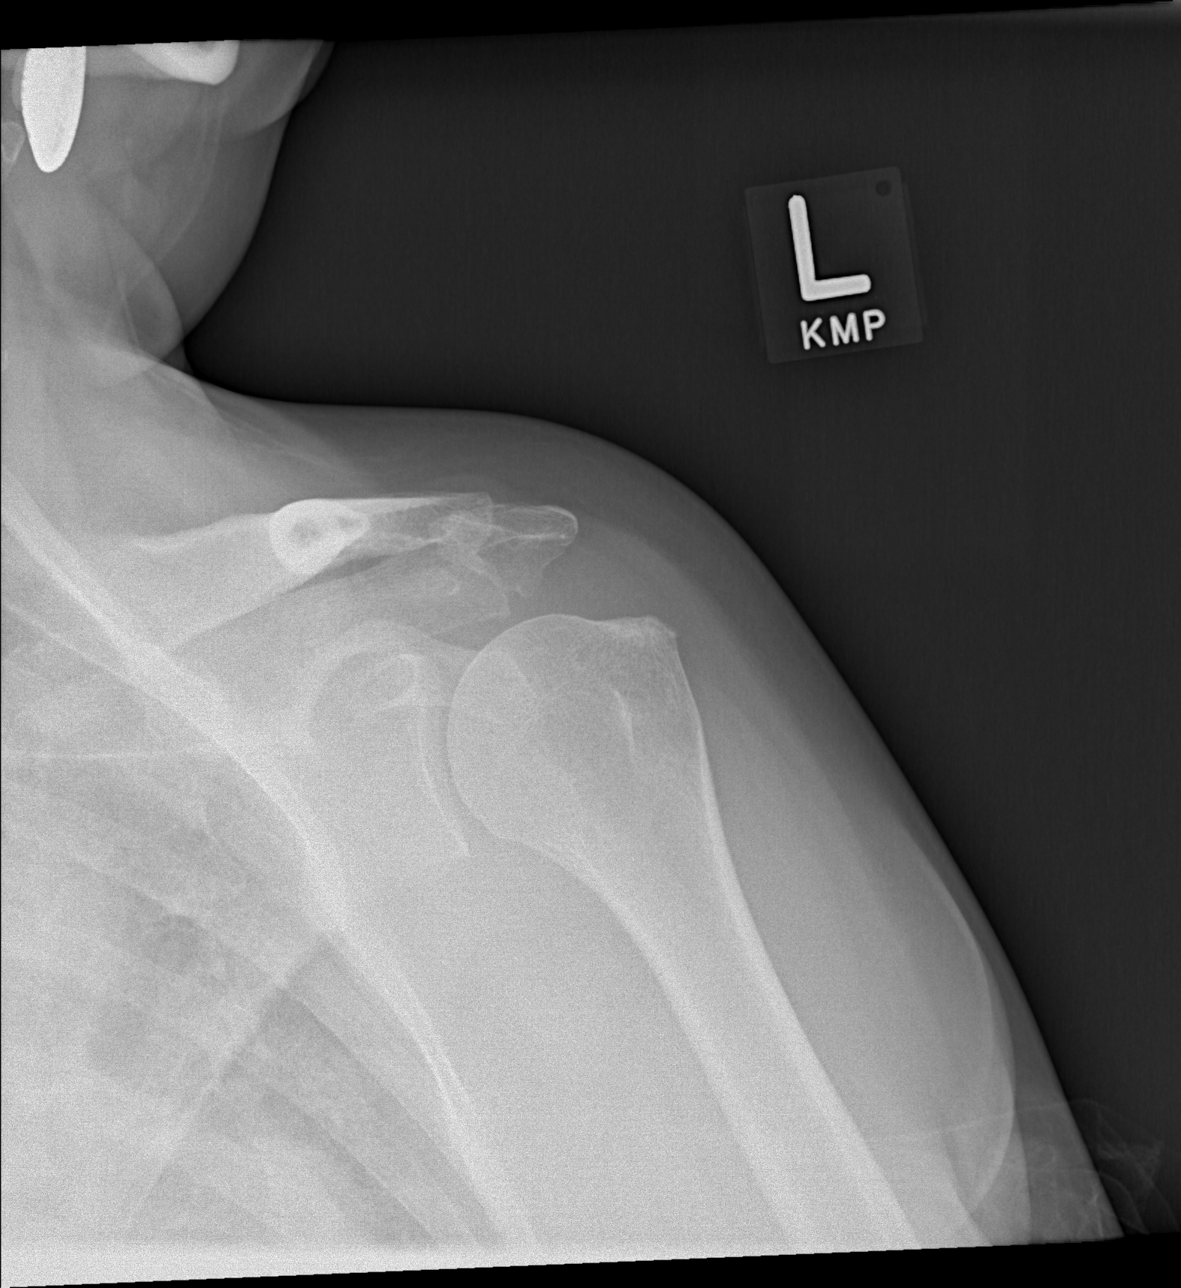

[w shoulder y-view left]
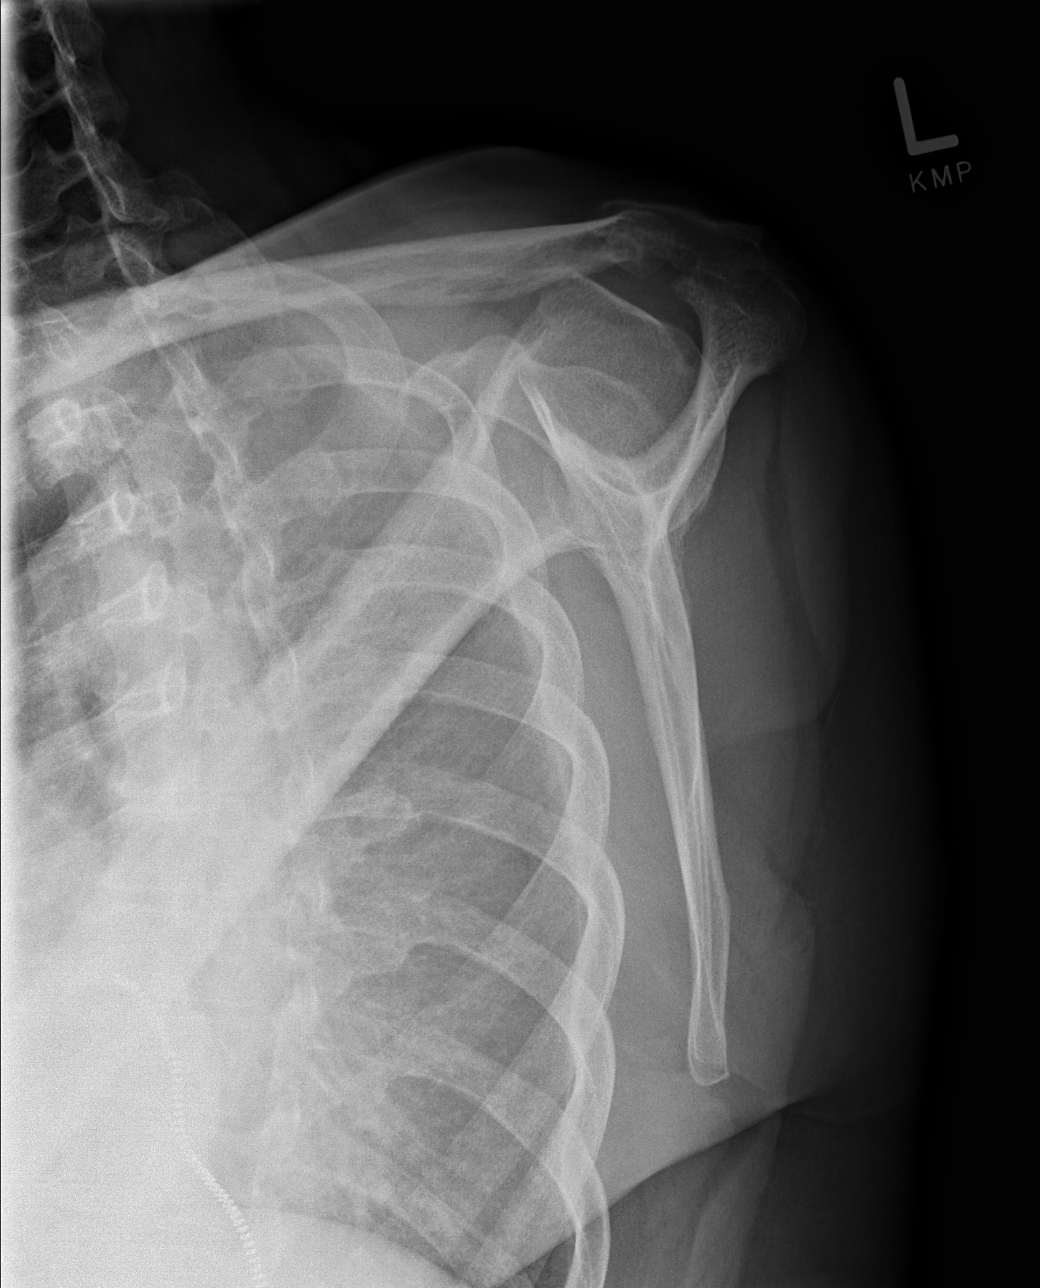

[x shoulder axillary left]
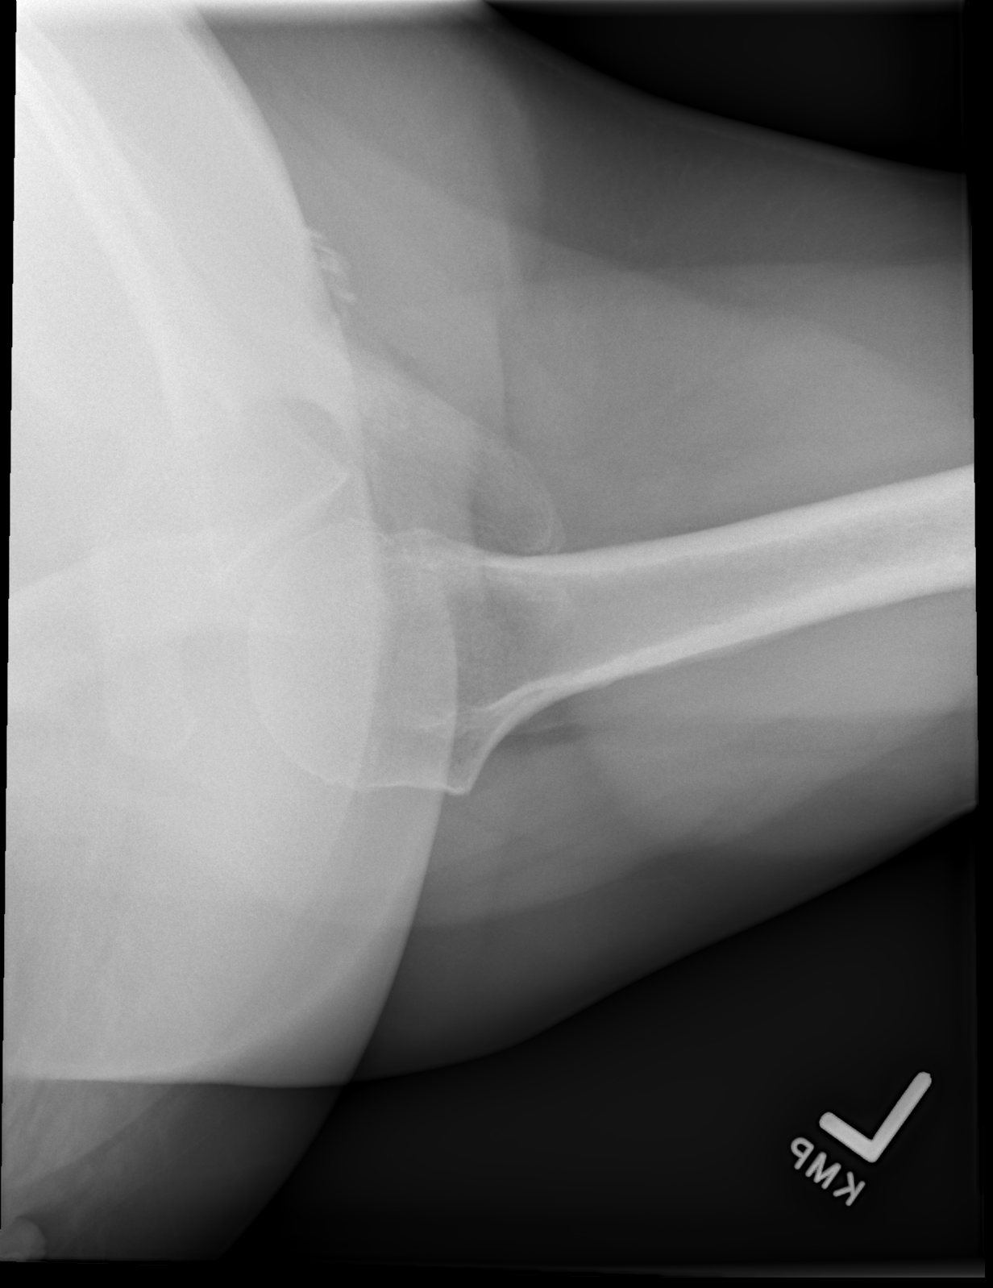

[3 of 3 positions shown; findings below may reference images not displayed]

FINDINGS: The humeral head is well located. The underlying rib cage is within
normal limits. There is a well corticated bony density in the
expected region of the acromion likely representing a previous
acromion fracture with nonunion. No new fracture is seen. No soft
tissue abnormality is noted.
IMPRESSION: No acute abnormality noted. There are changes suggestive of a
previous acromial fracture with nonunion.

## 2017-07-22 ENCOUNTER — Other Ambulatory Visit (INDEPENDENT_AMBULATORY_CARE_PROVIDER_SITE_OTHER): Payer: BC Managed Care – PPO

## 2017-07-22 DIAGNOSIS — I10 Essential (primary) hypertension: Secondary | ICD-10-CM | POA: Diagnosis not present

## 2017-07-22 DIAGNOSIS — E785 Hyperlipidemia, unspecified: Secondary | ICD-10-CM | POA: Diagnosis not present

## 2017-07-22 DIAGNOSIS — E1169 Type 2 diabetes mellitus with other specified complication: Secondary | ICD-10-CM

## 2017-07-22 DIAGNOSIS — E1165 Type 2 diabetes mellitus with hyperglycemia: Secondary | ICD-10-CM

## 2017-07-22 DIAGNOSIS — R5383 Other fatigue: Secondary | ICD-10-CM

## 2017-07-22 DIAGNOSIS — IMO0002 Reserved for concepts with insufficient information to code with codable children: Secondary | ICD-10-CM

## 2017-07-22 LAB — CBC WITH DIFFERENTIAL/PLATELET
BASOS ABS: 0.1 10*3/uL (ref 0.0–0.1)
BASOS PCT: 0.7 % (ref 0.0–3.0)
EOS ABS: 0.4 10*3/uL (ref 0.0–0.7)
Eosinophils Relative: 4.4 % (ref 0.0–5.0)
HCT: 39.8 % (ref 36.0–46.0)
HEMOGLOBIN: 12.7 g/dL (ref 12.0–15.0)
Lymphocytes Relative: 28.7 % (ref 12.0–46.0)
Lymphs Abs: 2.6 10*3/uL (ref 0.7–4.0)
MCHC: 31.8 g/dL (ref 30.0–36.0)
MCV: 91.7 fl (ref 78.0–100.0)
MONO ABS: 0.5 10*3/uL (ref 0.1–1.0)
Monocytes Relative: 5 % (ref 3.0–12.0)
Neutro Abs: 5.6 10*3/uL (ref 1.4–7.7)
Neutrophils Relative %: 61.2 % (ref 43.0–77.0)
Platelets: 406 10*3/uL — ABNORMAL HIGH (ref 150.0–400.0)
RBC: 4.34 Mil/uL (ref 3.87–5.11)
RDW: 13.4 % (ref 11.5–15.5)
WBC: 9.2 10*3/uL (ref 4.0–10.5)

## 2017-07-22 LAB — COMPREHENSIVE METABOLIC PANEL
ALBUMIN: 4 g/dL (ref 3.5–5.2)
ALT: 34 U/L (ref 0–35)
AST: 21 U/L (ref 0–37)
Alkaline Phosphatase: 82 U/L (ref 39–117)
BUN: 16 mg/dL (ref 6–23)
CHLORIDE: 105 meq/L (ref 96–112)
CO2: 27 meq/L (ref 19–32)
CREATININE: 0.64 mg/dL (ref 0.40–1.20)
Calcium: 9.6 mg/dL (ref 8.4–10.5)
GFR: 122.79 mL/min (ref >60.00)
Glucose, Bld: 105 mg/dL — ABNORMAL HIGH (ref 70–99)
Potassium: 3.9 mEq/L (ref 3.5–5.1)
SODIUM: 139 meq/L (ref 135–145)
Total Bilirubin: 0.4 mg/dL (ref 0.2–1.2)
Total Protein: 7.2 g/dL (ref 6.0–8.3)

## 2017-07-22 LAB — LIPID PANEL
CHOL/HDL RATIO: 3
CHOLESTEROL: 135 mg/dL (ref 0–200)
HDL: 45.6 mg/dL (ref >39.00)
LDL CALC: 70 mg/dL (ref 0–99)
NonHDL: 89.01
Triglycerides: 95 mg/dL (ref 0.0–149.0)
VLDL: 19 mg/dL (ref 0.0–40.0)

## 2017-07-23 ENCOUNTER — Encounter: Payer: Self-pay | Admitting: Internal Medicine

## 2017-07-23 ENCOUNTER — Ambulatory Visit (INDEPENDENT_AMBULATORY_CARE_PROVIDER_SITE_OTHER): Payer: BC Managed Care – PPO | Admitting: Internal Medicine

## 2017-07-23 VITALS — BP 130/82 | HR 92 | Wt 191.0 lb

## 2017-07-23 DIAGNOSIS — E669 Obesity, unspecified: Secondary | ICD-10-CM | POA: Diagnosis not present

## 2017-07-23 DIAGNOSIS — E1165 Type 2 diabetes mellitus with hyperglycemia: Secondary | ICD-10-CM

## 2017-07-23 DIAGNOSIS — E113299 Type 2 diabetes mellitus with mild nonproliferative diabetic retinopathy without macular edema, unspecified eye: Secondary | ICD-10-CM

## 2017-07-23 DIAGNOSIS — Z794 Long term (current) use of insulin: Secondary | ICD-10-CM

## 2017-07-23 DIAGNOSIS — Z6832 Body mass index (BMI) 32.0-32.9, adult: Secondary | ICD-10-CM | POA: Diagnosis not present

## 2017-07-23 LAB — POCT GLYCOSYLATED HEMOGLOBIN (HGB A1C): Hemoglobin A1C: 6.2

## 2017-07-23 MED ORDER — INSULIN NPH (HUMAN) (ISOPHANE) 100 UNIT/ML ~~LOC~~ SUSP: SUBCUTANEOUS | 5 refills | Status: DC

## 2017-07-23 NOTE — Patient Instructions (Addendum)
  Please change: Insulin Before breakfast Before lunch Before dinner  Novolog 10 units  10 units with a smaller meal 15 units with larger meal 20 units with a smaller meal 25 units with a regular meal  NPH 15 - 35    Continue Metformin 1000 mg 2x a day   Please come back for a follow-up appointment in 3 months with your log.

## 2017-07-23 NOTE — Progress Notes (Signed)
Patient ID: [name], female   DOB: 1961-09-30, 56 y.o.   MRN: [id number]  HPI: [name] is a 56 y.o.-year-old female, returning for f/u for DM2, dx ~2000, insulin-dependent since 2010, uncontrolled, with complications (PN, + DR). Last visit 3 mo ago.   Last hemoglobin A1c was: Lab Results  Component Value Date   HGBA1C 6.8 04/09/2017   HGBA1C 6.4 01/08/2017   HGBA1C 6.4 10/08/2016   Pt is on: - Metformin 1000 mg 2x a day   Insulin Before breakfast Before lunch Before dinner  Novolog 10 units with a smaller meal 15 units with a larger meal 10 units with a smaller meal 15 units with larger meal 20 units with a smaller meal 25 units with a regular meal  NPH 15 - 40     Pt is checking her sugars 1-3x a day:  - am:   45 (3x), 110-120, 200s if skips doses >> 38, 45, 76-154, 189 - 2h after b'fast: 120-195 >> 154 >> n/c > 203 >> n/c - lunch:  47, 79 (more active at work) >> 47, 57, 60-167, 187 >> 130-140s >> 61-71 - mid-afternoon:  40x1, 78-159, 167 >> n/c >> 106-149, 298 >> n/c >> 92-155 - before dinner:60, 98-230, 255 >> n/c >> 46, 229 >> n/c >> ? >> 135 - 2h after dinner: 72, 114-292 >> n/c >> 216, 219, 451 >> n/c  - bedtime:   up to 300 >> 94-164 >> 92-240 >> n/c >> 90-153, 202, 266 (fried potatoes) - nighttime: 145-239, 338 >> n/c Lowest sugar was 36 x1 >> 60 >> 46 >> 48 >> 45 >> 38 x1, 45 x1; she has hypoglycemia awareness at 80.  Highest sugar was  279 >> 200s.  - No CKD, last BUN/creatinine:  Lab Results  Component Value Date   BUN 16 07/22/2017   CREATININE 0.64 07/22/2017  Last ACR 0.5 06/19/13. Not on ACEI/ARB.  - last set of lipids: Lab Results  Component Value Date   CHOL 135 07/22/2017   HDL 45.60 07/22/2017   LDLCALC 70 07/22/2017   LDLDIRECT 94.0 09/25/2015   TRIG 95.0 07/22/2017   CHOLHDL 3 07/22/2017  On Lipitor.  - last eye exam was in 03/2017 >> she has DR L>R. + glaucoma Dr Arnoldo Morale. Has drops >> improved. - she has numbness and tingling  in her feet. Seeing a podiatrist. On Neurontin 2 tabs a day  She also has a history of hypertension, hyperlipidemia-on Lipitor, episodic alcohol abuse, GERD, low back pain, osteoarthritis.   ROS: Constitutional: no weight gain/no weight loss, no fatigue, no subjective hyperthermia, no subjective hypothermia Eyes: no blurry vision, no xerophthalmia ENT: no sore throat, no nodules palpated in throat, no dysphagia, no odynophagia, no hoarseness Cardiovascular: no CP/no SOB/no palpitations/no leg swelling Respiratory: no cough/no SOB/no wheezing Gastrointestinal: no N/no V/no D/no C/no acid reflux Musculoskeletal: + muscle aches/+ joint aches Skin: no rashes, no hair loss Neurological: no tremors/+ numbness/+ tingling/no dizziness  I reviewed pt's medications, allergies, PMH, social hx, family hx, and changes were documented in the history of present illness. Otherwise, unchanged from my initial visit note.   PE: BP 130/82 (BP Location: Left Arm, Patient Position: Sitting)   Pulse 92   Wt 191 lb (86.6 kg)   SpO2 96%   BMI 32.79 kg/m  Body mass index is 32.79 kg/m. Wt Readings from Last 3 Encounters:  07/23/17 191 lb (86.6 kg)  06/23/17 189 lb 6.4 oz (85.9 kg)  05/04/17 187  lb 12.8 oz (85.2 kg)   Constitutional: overweight, in NAD Eyes: PERRLA, EOMI, no exophthalmos ENT: moist mucous membranes, no thyromegaly, no cervical lymphadenopathy Cardiovascular: RRR, No MRG Respiratory: CTA B Gastrointestinal: abdomen soft, NT, ND, BS+ Musculoskeletal: no deformities, strength intact in all 4 Skin: moist, warm, no rashes Neurological: no tremor with outstretched hands, DTR normal in all 4   ASSESSMENT: 1. DM2, insulin-dependent, uncontrolled, with complications - peripheral neuropathy - DR   2. Obesity class 1 - BMI 32.79  BMI Classification:  < 18.5 underweight   18.5-24.9 normal weight   25.0-29.9 overweight   30.0-34.9 class I obesity   35.0-39.9 class II obesity   ?  40.0 class III obesity   PLAN:  1. Patient with long-standing, uncontrolled Diabetes, on basal-bolus insulin regimen, with NPH and NovoLog insulins. At this visit, her sugars are still good, that she is compliant with her insulin doses. She even had low blood sugars in the morning, but she did not write down in her log what could have caused them. We discussed to start writing down when she sees that her sugars are abnormal to see if we can figure out why. For now, to avoid further low blood sugars in the morning, I advised her to decrease the dose of NPH at night. She also has low blood sugars before lunch, so we will go ahead and decrease the NovoLog before breakfast. - I advised her to: Patient Instructions   Please continue: Insulin Before breakfast Before lunch Before dinner  Novolog 10 units with a smaller meal 15 units with a larger meal 10 units with a smaller meal 15 units with larger meal 20 units with a smaller meal 25 units with a regular meal  NPH 15 - 40    Continue Metformin 1000 mg 2x a day   Please come back for a follow-up appointment in 3 months with your log.  - today, HbA1c is 6.2% (better) - continue checking sugars at different times of the day - check 3x a day, rotating checks - advised for yearly eye exams >> she is UTD - Return to clinic in 3 mo with sugar log   2. Obesity class 1 - Discussed about dietary changes to lose weight; she needs to get rid of the fried potatoes that she eats before her second shift.  - discussed about packing snacks or meals from home  - reducing the insulin doses will also help   Philemon Kingdom, MD PhD [facility] Endocrin[facility]

## 2017-08-03 ENCOUNTER — Emergency Department (HOSPITAL_COMMUNITY)
Admission: EM | Admit: 2017-08-03 | Discharge: 2017-08-03 | Disposition: A | Discharge status: Home / Self Care | Payer: BC Managed Care – PPO | Attending: Emergency Medicine | Admitting: Emergency Medicine

## 2017-08-03 ENCOUNTER — Telehealth: Payer: Self-pay | Admitting: *Deleted

## 2017-08-03 ENCOUNTER — Encounter (HOSPITAL_COMMUNITY): Payer: Self-pay

## 2017-08-03 ENCOUNTER — Emergency Department (HOSPITAL_COMMUNITY): Payer: BC Managed Care – PPO

## 2017-08-03 DIAGNOSIS — M25572 Pain in left ankle and joints of left foot: Secondary | ICD-10-CM

## 2017-08-03 DIAGNOSIS — Z79899 Other long term (current) drug therapy: Secondary | ICD-10-CM | POA: Diagnosis not present

## 2017-08-03 DIAGNOSIS — Z794 Long term (current) use of insulin: Secondary | ICD-10-CM | POA: Insufficient documentation

## 2017-08-03 DIAGNOSIS — E11319 Type 2 diabetes mellitus with unspecified diabetic retinopathy without macular edema: Secondary | ICD-10-CM | POA: Insufficient documentation

## 2017-08-03 DIAGNOSIS — M79662 Pain in left lower leg: Secondary | ICD-10-CM | POA: Insufficient documentation

## 2017-08-03 DIAGNOSIS — I10 Essential (primary) hypertension: Secondary | ICD-10-CM | POA: Insufficient documentation

## 2017-08-03 DIAGNOSIS — Z87891 Personal history of nicotine dependence: Secondary | ICD-10-CM | POA: Insufficient documentation

## 2017-08-03 DIAGNOSIS — R2242 Localized swelling, mass and lump, left lower limb: Secondary | ICD-10-CM | POA: Diagnosis present

## 2017-08-03 LAB — CBG MONITORING, ED: GLUCOSE-CAPILLARY: 92 mg/dL (ref 65–99)

## 2017-08-03 LAB — D-DIMER, QUANTITATIVE (NOT AT ARMC): D DIMER QUANT: 0.51 ug{FEU}/mL — AB (ref 0.00–0.50)

## 2017-08-03 MED ORDER — ENOXAPARIN SODIUM 100 MG/ML ~~LOC~~ SOLN
1.0000 mg/kg | Freq: Once | SUBCUTANEOUS | Status: AC
  Administered 2017-08-03: 85 mg via SUBCUTANEOUS
  Filled 2017-08-03: qty 0.85

## 2017-08-03 NOTE — Telephone Encounter (Signed)
Please advise 

## 2017-08-03 NOTE — Telephone Encounter (Signed)
Patient stated that 04/30/2017 she slammed foot in door at work. Xray was done of left great toe and came back normal. Noticed left foot swollen on 08/01/2017. Patient denies any pain and says that it is a little warm to touch. Patient denies any other pain or symptoms, denies any swelling in right foot. She says she was just concerned about the swelling. I told patient she would probably need to be evaluated and that Dr Derrel Nip is out of office. Please advise

## 2017-08-03 NOTE — ED Triage Notes (Signed)
Patient states she was hit by a cart and or door on the left foot back in 04/2017. Patient states that she had swelling x 2 days.

## 2017-08-03 NOTE — Discharge Instructions (Signed)
I am giving you information about blood clots (DVT) we are not sure if you have a DVT. You will need to go to [facility] in the morning to have an ultrasound study of the left leg to be sure you do not have a blood clot.

## 2017-08-03 NOTE — ED Notes (Signed)
Pt ambulatory and independent at discharge.  Ambulatory and independent at discharge.

## 2017-08-03 NOTE — Telephone Encounter (Signed)
Patient stated that she would go to urgent care in [place] today to be evaluated.

## 2017-08-03 NOTE — Telephone Encounter (Signed)
Pt has requested a call to discuss her having swelling in her left foot. Pt foot was slammed in the door at work. Pt stated that she was seen for the injury, however the foot now has swelling.  Pt contact [phone]

## 2017-08-03 NOTE — ED Provider Notes (Signed)
[place] DEPT Provider Note   CSN: [id number] Arrival date & time: 08/03/17  1533     History   Chief Complaint Chief Complaint  Patient presents with  . Foot Swelling    HPI [name] is a 56 y.o. female who presents to the ED with left foot swelling. Patient reports that she first noted the swelling 3 days ago. The pain is 8/10. Patient reports that in June she was at work and another employee pushed a door open and it hit her big toe and it jammed her big toe and foot. She has had pain off and on since then but did no see any swelling until 3 days ago. The pain radiates from the ankle to the left calf. Patient also c/o swelling to the left knee but she is being treated for that.   The history is provided by the patient. No language interpreter was used.  Ankle Pain   The incident occurred more than 2 days ago. The pain is present in the left ankle. The quality of the pain is described as aching and sharp. The pain is at a severity of 8/10. The pain has been intermittent since onset. Pertinent negatives include no loss of sensation. She reports no foreign bodies present. She has tried nothing for the symptoms.    Past Medical History:  Diagnosis Date  . Arthritis   . Campylobacter enteritis May 2012   [facility] admission  . Carpal tunnel syndrome, bilateral   . Diabetes mellitus   . Gastritis with bleeding due to alcohol 2010   [facility] admission  . GERD (gastroesophageal reflux disease)   . Hyperlipidemia   . Hypertension    no meds  . Wears glasses     Patient Active Problem List   Diagnosis Date Noted  . Class 1 obesity with serious comorbidity and body mass index (BMI) of 32.0 to 32.9 in adult 07/23/2017  . Acute pain of left shoulder 05/05/2017  . Contusion of left great toe without damage to nail 05/05/2017  . Left hip pain 05/04/2017  . Carpal tunnel syndrome on right 12/27/2016  . Polyarthralgia 11/09/2016  . Grief reaction 07/24/2016  . Uncontrolled type 2  diabetes mellitus with retinopathy, with long-term current use of insulin ([place]) 07/07/2016  . Visit for preventive health examination 06/21/2016  . Increased urinary frequency 06/21/2016  . Chronic meniscal tear of knee 08/19/2015  . Left knee pain 06/19/2015  . Candidiasis of skin 06/18/2015  . Genital herpes 07/31/2014  . Ulnar nerve compression 09/21/2013  . Noncompliance with diet and medication regimen 11/15/2012  . Tenosynovitis of thumb 01/02/2012  . Arthritis   . Nausea 08/20/2011  . Screening for breast cancer 08/04/2011  . Screening for colon cancer 08/04/2011  . HIDRADENITIS SUPPURATIVA 04/11/2007  . GERD 11/17/2006  . ENDOMETRIAL POLYP 11/17/2006  . LOW BACK PAIN 11/17/2006  . FIBROIDS, UTERUS 11/16/2006  . Hyperlipidemia associated with type 2 diabetes mellitus ([place]) 11/16/2006  . ALCOHOL ABUSE, EPISODIC 11/16/2006  . Essential hypertension 11/16/2006    Past Surgical History:  Procedure Laterality Date  . CARPAL TUNNEL RELEASE Left 05/14/2014   Procedure: LEFT ULNAR NEUROPLASTY AT ELBOW AND ENDOSCOPIC CARPAL TUNNEL RELEASE;  Surgeon: Jolyn Nap, MD;  Location: [facility];  Service: Orthopedics;  Laterality: Left;  . CYST EXCISION  1995   under arms  . endometrial biopsy  2004   benign  . FOOT OSTEOTOMY  2010   right-spurs  . TONSILLECTOMY    .  ULNAR NERVE TRANSPOSITION Left 05/14/2014   Procedure: ULNAR NERVE DECOMPRESSION/TRANSPOSITION;  Surgeon: Jolyn Nap, MD;  Location: [facility];  Service: Orthopedics;  Laterality: Left;    OB History    No data available       Home Medications    Prior to Admission medications   Medication Sig Start Date End Date Taking? Authorizing Provider  atorvastatin (LIPITOR) 40 MG tablet TAKE ONE TABLET BY MOUTH ONCE DAILY 07/22/16   Crecencio Mc, MD  cyclobenzaprine (FLEXERIL) 10 MG tablet Take 1 tablet (10 mg total) by mouth at bedtime. 06/23/17   Crecencio Mc, MD  etodolac  (LODINE) 500 MG tablet  05/05/17   [provider]  gabapentin (NEURONTIN) 300 MG capsule Take 1 capsule (300 mg total) by mouth 2 (two) times daily. 06/23/17   Crecencio Mc, MD  glucose blood (BAYER CONTOUR TEST) test strip TEST BLOOD SUGARS 3 TIMES DAILY 08/12/16   Philemon Kingdom, MD  insulin aspart (NOVOLOG) 100 UNIT/ML injection Inject into the skin 10-30 units before meals as advised 10/08/16   Philemon Kingdom, MD  insulin NPH Human (NOVOLIN N RELION) 100 UNIT/ML injection Inject 15 units under skin in the AM and 35 units in the evening 07/23/17   Philemon Kingdom, MD  Lancet Devices (EASY TOUCH LANCING DEVICE) MISC Use to check diabetes 4 times daily  E 11.65 08/12/16   Philemon Kingdom, MD  lidocaine (LIDODERM) 5 % Reported on 06/19/2016 01/28/16   [provider]  metFORMIN (GLUCOPHAGE) 1000 MG tablet TAKE ONE TABLET BY MOUTH TWICE DAILY WITH A MEAL 10/02/16   Crecencio Mc, MD  NOVOLIN R RELION 100 UNIT/ML injection INJECT UP TO 50 UNITS SUBCUTANEOUSLY ONCE DAILY AS DIRECTED 06/21/17   Philemon Kingdom, MD  nystatin (MYCOSTATIN/NYSTOP) 100000 UNIT/GM POWD Apply powder to affected area twice daily until rash has resolved 06/18/15   Crecencio Mc, MD  omeprazole (PRILOSEC) 40 MG capsule Take 1 capsule (40 mg total) by mouth daily. 06/23/17   Crecencio Mc, MD  traMADol (ULTRAM) 50 MG tablet Take 1 tablet (50 mg total) by mouth every 6 (six) hours as needed. Maximum 3 daily 06/23/17   Crecencio Mc, MD    Family History Family History  Problem Relation Age of Onset  . Hypertension Mother   . Stroke Father   . Diabetes Maternal Grandmother   . Hypertension Maternal Grandmother     Social History Social History  Substance Use Topics  . Smoking status: Former Smoker    Quit date: 05/09/2012  . Smokeless tobacco: Never Used  . Alcohol use No     Comment: not for 7 yr     Allergies   Oxycodone-acetaminophen and Penicillins   Review of Systems Review of  Systems  Constitutional: Negative for chills and fever.  HENT: Negative.   Eyes: Negative for visual disturbance.  Respiratory: Negative for shortness of breath.   Gastrointestinal: Positive for nausea. Negative for vomiting.  Musculoskeletal: Positive for arthralgias.       Left ankle  Skin: Negative for wound.  Psychiatric/Behavioral: Negative for confusion.     Physical Exam Updated Vital Signs BP (!) 146/88 (BP Location: Left Arm)   Pulse 91   Temp 98.1 F (36.7 C) (Oral)   Resp 18   Ht 5\' 4"  (1.626 m)   Wt 86.2 kg (190 lb)   SpO2 100%   BMI 32.61 kg/m   Physical Exam  Constitutional: She is oriented to person,  place, and time. She appears well-developed and well-nourished. No distress.  HENT:  Head: Normocephalic and atraumatic.  Eyes: EOM are normal.  Neck: Neck supple.  Cardiovascular: Normal rate and intact distal pulses.   Pulmonary/Chest: Effort normal.  Musculoskeletal:       Left lower leg: She exhibits tenderness and swelling. She exhibits no laceration.  The right calf measures 14.5" and the left measures 15.5". The left calf is tender on exam. There is no erythema or increased warmth. The left ankle is slightly swollen and tender with range of motion.   Neurological: She is alert and oriented to person, place, and time. No cranial nerve deficit.  Skin: Skin is warm and dry.  Psychiatric: She has a normal mood and affect. Her behavior is normal.  Nursing note and vitals reviewed.    ED Treatments / Results  Labs (all labs ordered are listed, but only abnormal results are displayed) Labs Reviewed  D-DIMER, QUANTITATIVE (NOT AT [facility]) - Abnormal; Notable for the following:       Result Value   D-Dimer, Quant 0.51 (*)    All other components within normal limits  CBG MONITORING, ED     Radiology Dg Ankle Complete Left  Result Date: 08/03/2017 CLINICAL DATA:  LEFT ankle pain and swelling. History of work injury June. EXAM: LEFT ANKLE COMPLETE - 3+  VIEW COMPARISON:  None. FINDINGS: No fracture deformity nor dislocation. The ankle mortise appears congruent and the tibiofibular syndesmosis intact. Moderate plantar calcaneal spur. No destructive bony lesions. Soft tissue planes are non-suspicious. IMPRESSION: Negative. Electronically Signed   By: Elon Alas M.D.   On: 08/03/2017 20:54    Procedures Procedures (including critical care time)  Medications Ordered in ED Medications  enoxaparin (LOVENOX) injection 85 mg (not administered)     Initial Impression / Assessment and Plan / ED Course  I have reviewed the triage vital signs and the nursing notes. 56 y.o. female with pain to the left calf and left ankle stable for d/c to f/u at [facility] for DVT study in the morning at 8 am. Lovenox injection given prior to d/c. Discussed with the patient importance of follow up.   Final Clinical Impressions(s) / ED Diagnoses   Final diagnoses:  Pain of left calf  Acute left ankle pain    New Prescriptions New Prescriptions   No medications on file     Debroah Baller [place], NP 08/03/17 2335    Blanchie Dessert, MD 08/08/17 [phone]

## 2017-08-03 NOTE — Telephone Encounter (Signed)
In reviewing, her injury was in 04/2017.  If she is having increased swelling of her foot and increased warmth, she needs to be evaluated to confirm etiology of symptoms.  Please confirm no other acute symptoms, i.e., fever, swelling extending up the leg, redness, etc.

## 2017-08-04 ENCOUNTER — Ambulatory Visit (HOSPITAL_COMMUNITY)
Admission: RE | Admit: 2017-08-04 | Discharge: 2017-08-04 | Disposition: A | Discharge status: Home / Self Care | Payer: BC Managed Care – PPO | Source: Ambulatory Visit | Attending: Nurse Practitioner | Admitting: Nurse Practitioner

## 2017-08-04 DIAGNOSIS — M7989 Other specified soft tissue disorders: Secondary | ICD-10-CM

## 2017-08-04 DIAGNOSIS — M7122 Synovial cyst of popliteal space [Baker], left knee: Secondary | ICD-10-CM | POA: Insufficient documentation

## 2017-08-04 DIAGNOSIS — M79609 Pain in unspecified limb: Secondary | ICD-10-CM | POA: Diagnosis not present

## 2017-08-04 DIAGNOSIS — M79605 Pain in left leg: Secondary | ICD-10-CM | POA: Insufficient documentation

## 2017-08-04 NOTE — Progress Notes (Signed)
VASCULAR LAB PRELIMINARY  PRELIMINARY  PRELIMINARY  PRELIMINARY  Left lower extremity venous duplex completed.    Preliminary report:  Left:  No evidence of DVTor superficial thrombosis. There is a large area of mixed echoes and fluid extending 12.29 cm from the popliteal fossa into the calf suggestive of a ruptured Baker's cyst.  Haydyn Girvan, RVS 08/04/2017, 9:49 AM

## 2017-08-12 ENCOUNTER — Other Ambulatory Visit: Payer: Self-pay | Admitting: Internal Medicine

## 2017-08-16 ENCOUNTER — Encounter: Payer: Self-pay | Admitting: Family

## 2017-08-16 ENCOUNTER — Ambulatory Visit (INDEPENDENT_AMBULATORY_CARE_PROVIDER_SITE_OTHER): Payer: BC Managed Care – PPO | Admitting: Family

## 2017-08-16 VITALS — BP 132/86 | HR 85 | Temp 97.8°F | Ht 64.0 in | Wt 194.0 lb

## 2017-08-16 DIAGNOSIS — M1990 Unspecified osteoarthritis, unspecified site: Secondary | ICD-10-CM | POA: Diagnosis not present

## 2017-08-16 MED ORDER — LIDOCAINE 5 % EX PTCH: 1.0000 | MEDICATED_PATCH | CUTANEOUS | 1 refills | Status: DC

## 2017-08-16 MED ORDER — DULOXETINE HCL 30 MG PO CPEP: ORAL_CAPSULE | ORAL | 3 refills | Status: DC

## 2017-08-16 MED ORDER — GABAPENTIN 300 MG PO CAPS: 300.0000 mg | ORAL_CAPSULE | Freq: Two times a day (BID) | ORAL | 1 refills | Status: DC

## 2017-08-16 NOTE — Patient Instructions (Signed)
Trial Cymbalta. As discussed, start taking 1 tablet for the first week, in the second week will start taking 2 tablets for total 60 mg once per day.  Ice to left knee Elevation.  Compression  Please follow-up in 6-8 weeks see this medication is helping you and if we need further evaluation/ imaging, also physical therapy

## 2017-08-16 NOTE — Progress Notes (Signed)
Subjective:    Patient ID: [name], female    DOB: Aug 17, 1961, 56 y.o.   MRN: [id number]  CC: [name] is a 56 y.o. female who presents today for follow up.   HPI: .  Complains of multiple joint pains - including chronic low back pain, left knee and ankle pain, unchanged x several months. Prior to and after accident with door.  The back pain is midline. No injury. Pain in low back, knee, foot worsened in the end of day, after standing. Thinks there is some swelling in left knee. No fever, chills. Feels like left knee will give out at times. In the past she's had prednisone injections however concerned in doing this again due to her diabetes. No tingling. Occasional numbness of toes.   No history of cancer, urinary incontinence, seizures.  Never got the tramadol from PCP.   Gabapentin, ibuprofen with some relief.     XR left ankle 2018- negative for fracture  XR left knee 2016- No significant degenerative change.    No CKD.  HISTORY:  Past Medical History:  Diagnosis Date  . Arthritis   . Campylobacter enteritis May 2012   [facility] admission  . Carpal tunnel syndrome, bilateral   . Diabetes mellitus   . Gastritis with bleeding due to alcohol 2010   [facility] admission  . GERD (gastroesophageal reflux disease)   . Hyperlipidemia   . Hypertension    no meds  . Wears glasses    Past Surgical History:  Procedure Laterality Date  . CARPAL TUNNEL RELEASE Left 05/14/2014   Procedure: LEFT ULNAR NEUROPLASTY AT ELBOW AND ENDOSCOPIC CARPAL TUNNEL RELEASE;  Surgeon: Jolyn Nap, MD;  Location: [place];  Service: Orthopedics;  Laterality: Left;  . CYST EXCISION  1995   under arms  . endometrial biopsy  2004   benign  . FOOT OSTEOTOMY  2010   right-spurs  . TONSILLECTOMY    . ULNAR NERVE TRANSPOSITION Left 05/14/2014   Procedure: ULNAR NERVE DECOMPRESSION/TRANSPOSITION;  Surgeon: Jolyn Nap, MD;  Location: [place];  Service:  Orthopedics;  Laterality: Left;   Family History  Problem Relation Age of Onset  . Hypertension Mother   . Stroke Father   . Diabetes Maternal Grandmother   . Hypertension Maternal Grandmother     Allergies: Oxycodone-acetaminophen and Penicillins Current Outpatient Prescriptions on File Prior to Visit  Medication Sig Dispense Refill  . atorvastatin (LIPITOR) 40 MG tablet TAKE ONE TABLET BY MOUTH ONCE DAILY 30 tablet 5  . cyclobenzaprine (FLEXERIL) 10 MG tablet Take 1 tablet (10 mg total) by mouth at bedtime. 30 tablet 5  . glucose blood (BAYER CONTOUR TEST) test strip TEST BLOOD SUGARS 3 TIMES DAILY 200 each 11  . insulin aspart (NOVOLOG) 100 UNIT/ML injection Inject into the skin 10-30 units before meals as advised 20 mL 2  . insulin NPH Human (NOVOLIN N RELION) 100 UNIT/ML injection Inject 15 units under skin in the AM and 35 units in the evening 30 mL 5  . Lancet Devices (EASY TOUCH LANCING DEVICE) MISC Use to check diabetes 4 times daily  E 11.65 1 each 11  . metFORMIN (GLUCOPHAGE) 1000 MG tablet TAKE ONE TABLET BY MOUTH TWICE DAILY WITH A MEAL 60 tablet 5  . NOVOLIN N RELION 100 UNIT/ML injection INJECT 20 UNITS UNDER THE SKIN IN THE MORNING AND 55 UNITS IN THE EVENING 10 vial 11  . NOVOLIN R RELION 100 UNIT/ML injection INJECT  UP TO 50 UNITS SUBCUTANEOUSLY ONCE DAILY AS DIRECTED 10 mL 5  . nystatin (MYCOSTATIN/NYSTOP) 100000 UNIT/GM POWD Apply powder to affected area twice daily until rash has resolved 15 g 3  . omeprazole (PRILOSEC) 40 MG capsule Take 1 capsule (40 mg total) by mouth daily. 30 capsule 3  . [DISCONTINUED] esomeprazole (NEXIUM) 40 MG capsule Take 40 mg by mouth daily.     No current facility-administered medications on file prior to visit.     Social History  Substance Use Topics  . Smoking status: Former Smoker    Quit date: 05/09/2012  . Smokeless tobacco: Never Used  . Alcohol use No     Comment: not for 7 yr    Review of Systems  Constitutional:  Negative for chills and fever.  Respiratory: Negative for cough.   Cardiovascular: Negative for chest pain and palpitations.  Gastrointestinal: Negative for nausea and vomiting.  Musculoskeletal: Positive for back pain and joint swelling.      Objective:    BP 132/86   Pulse 85   Temp 97.8 F (36.6 C) (Oral)   Ht 5\' 4"  (1.626 m)   Wt 194 lb (88 kg)   SpO2 99%   BMI 33.30 kg/m  BP Readings from Last 3 Encounters:  08/16/17 132/86  08/03/17 (!) 146/88  07/23/17 130/82   Wt Readings from Last 3 Encounters:  08/16/17 194 lb (88 kg)  08/03/17 190 lb (86.2 kg)  07/23/17 191 lb (86.6 kg)    Physical Exam  Constitutional: She appears well-developed and well-nourished.  Eyes: Conjunctivae are normal.  Cardiovascular: Normal rate, regular rhythm, normal heart sounds and normal pulses.   Pulmonary/Chest: Effort normal and breath sounds normal. She has no wheezes. She has no rhonchi. She has no rales.  Musculoskeletal:       Right shoulder: She exhibits pain. She exhibits normal range of motion, no tenderness, no bony tenderness, no swelling and no spasm.       Left knee: She exhibits swelling. She exhibits normal range of motion, no effusion and no erythema. No tenderness found. No medial joint line tenderness noted.       Lumbar back: She exhibits normal range of motion, no tenderness, no bony tenderness, no swelling, no edema, no pain and no spasm.       Arms:      Legs: Pain as noted by patient marked on diagram. Full range of motion with flexion, tension, lateral side bends. No bony tenderness. No pain, numbness, tingling elicited with single leg raise bilaterally.   Bilateral knees are symmetric.   No increase in warmth or erythema. Crepitus felt with flexion of bilateral knees.  Left knee; Trace swelling noted medial sided knee. No increased warmth, erythema. Able to extend to -5 to 10 degrees and flex to 110 degrees without pain. No catching with McMurray maneuver. No  patellar apprehension. Negative anterior drawer and lachman's- no laxity appreciated.  No calf tenderness of lower leg edema bilaterally.    Neurological: She is alert. She has normal strength. No sensory deficit.  Reflex Scores:      Patellar reflexes are 2+ on the right side and 2+ on the left side. Sensation and strength intact bilateral lower extremities.  Skin: Skin is warm and dry.  Psychiatric: She has a normal mood and affect. Her speech is normal and behavior is normal. Thought content normal.  Vitals reviewed.      Assessment & Plan:   Problem List Items Addressed This Visit  Musculoskeletal and Integument   Arthritis - Primary    Working diagnosis of poly-arthralgia is osteoarthritic in nature. Patient works as Sports coach. Discussed with her conservative measures which she can do at home including heat, ice, compression. We'll trial Cymbalta. Patient will also have lidocaine, gabapentin to use for pain management. Follow-up in 6 weeks.      Relevant Medications   lidocaine (LIDODERM) 5 %   gabapentin (NEURONTIN) 300 MG capsule   DULoxetine (CYMBALTA) 30 MG capsule       I have discontinued Ms. Veillon's traMADol and etodolac. I have also changed her lidocaine. Additionally, I am having her start on DULoxetine. Lastly, I am having her maintain her nystatin, EASY TOUCH LANCING DEVICE, glucose blood, metFORMIN, insulin aspart, NOVOLIN R RELION, omeprazole, cyclobenzaprine, insulin NPH Human, NOVOLIN N RELION, atorvastatin, and gabapentin.   Meds ordered this encounter  Medications  . lidocaine (LIDODERM) 5 %    Sig: Place 1 patch onto the skin daily. Reported on 06/19/2016    Dispense:  30 patch    Refill:  1    Order Specific Question:   Supervising Provider    Answer:   Deborra Medina L [[id number]]  . gabapentin (NEURONTIN) 300 MG capsule    Sig: Take 1 capsule (300 mg total) by mouth 2 (two) times daily.    Dispense:  180 capsule    Refill:  1    Order Specific  Question:   Supervising Provider    Answer:   Deborra Medina L [[id number]]  . DULoxetine (CYMBALTA) 30 MG capsule    Sig: Take one 30 mg tablet by mouth once a day for the first week. Then increase to two 30 mg tablets ( total 60mg ) by mouth once daily.    Dispense:  60 capsule    Refill:  3    Order Specific Question:   Supervising Provider    Answer:   Crecencio Mc [[id number]]    Return precautions given.   Risks, benefits, and alternatives of the medications and treatment plan prescribed today were discussed, and patient expressed understanding.   Education regarding symptom management and diagnosis given to patient on AVS.  Continue to follow with Crecencio Mc, MD for routine health maintenance.   [name] and I agreed with plan.   Mable Paris, FNP

## 2017-08-16 NOTE — Assessment & Plan Note (Signed)
Working diagnosis of poly-arthralgia is osteoarthritic in nature. Patient works as Sports coach. Discussed with her conservative measures which she can do at home including heat, ice, compression. We'll trial Cymbalta. Patient will also have lidocaine, gabapentin to use for pain management. Follow-up in 6 weeks.

## 2017-08-30 NOTE — Telephone Encounter (Signed)
Error

## 2017-09-21 ENCOUNTER — Other Ambulatory Visit: Payer: Self-pay | Admitting: Internal Medicine

## 2017-09-21 ENCOUNTER — Telehealth: Payer: Self-pay | Admitting: Internal Medicine

## 2017-09-21 NOTE — Telephone Encounter (Signed)
Is it okay to schedule pt for 11:30am on Friday the 26th for knot under right breast?

## 2017-09-21 NOTE — Telephone Encounter (Signed)
Pt called requesting a refill on her nystatin (MYCOSTATIN/NYSTOP) 100000 UNIT/GM POWD. Please advise, thank you!  [place], [place]

## 2017-09-21 NOTE — Telephone Encounter (Signed)
Yes,  This may be a boil,  Not a true breast mass given her history

## 2017-09-21 NOTE — Addendum Note (Signed)
Addended by: Adair Laundry on: 09/21/2017 02:04 PM   Modules accepted: Orders

## 2017-09-21 NOTE — Telephone Encounter (Signed)
yes

## 2017-09-21 NOTE — Telephone Encounter (Signed)
Does pt need to schedule an appt with you first?

## 2017-09-21 NOTE — Telephone Encounter (Signed)
Refilled: 06/18/2015 Last OV: 06/23/2017 Next OV: 09/27/2017

## 2017-09-21 NOTE — Telephone Encounter (Signed)
PT called questing a referral to [facility] [place]. Pt states that she has a knot under her rt breast. Please advise, thank you!

## 2017-09-22 NOTE — Telephone Encounter (Signed)
Patient scheduled.

## 2017-09-22 NOTE — Telephone Encounter (Signed)
Will you please schedule pt for 09/24/2017 at 11:30 per Dr. Derrel Nip for knot under right breast. Pt is aware of appt date and time.

## 2017-09-23 MED ORDER — NYSTATIN 100000 UNIT/GM EX POWD: CUTANEOUS | 3 refills | Status: DC

## 2017-09-23 NOTE — Addendum Note (Signed)
Addended by: Crecencio Mc on: 09/23/2017 02:40 PM   Modules accepted: Orders

## 2017-09-23 NOTE — Telephone Encounter (Signed)
refilled 

## 2017-09-24 ENCOUNTER — Encounter: Payer: Self-pay | Admitting: Internal Medicine

## 2017-09-24 ENCOUNTER — Ambulatory Visit (INDEPENDENT_AMBULATORY_CARE_PROVIDER_SITE_OTHER): Payer: BC Managed Care – PPO | Admitting: Internal Medicine

## 2017-09-24 DIAGNOSIS — Z23 Encounter for immunization: Secondary | ICD-10-CM

## 2017-09-24 DIAGNOSIS — E1165 Type 2 diabetes mellitus with hyperglycemia: Secondary | ICD-10-CM

## 2017-09-24 DIAGNOSIS — E11319 Type 2 diabetes mellitus with unspecified diabetic retinopathy without macular edema: Secondary | ICD-10-CM

## 2017-09-24 DIAGNOSIS — N63 Unspecified lump in unspecified breast: Secondary | ICD-10-CM | POA: Diagnosis not present

## 2017-09-24 DIAGNOSIS — IMO0002 Reserved for concepts with insufficient information to code with codable children: Secondary | ICD-10-CM

## 2017-09-24 DIAGNOSIS — L732 Hidradenitis suppurativa: Secondary | ICD-10-CM

## 2017-09-24 DIAGNOSIS — Z794 Long term (current) use of insulin: Secondary | ICD-10-CM | POA: Diagnosis not present

## 2017-09-24 MED ORDER — SULFAMETHOXAZOLE-TRIMETHOPRIM 800-160 MG PO TABS: 1.0000 | ORAL_TABLET | Freq: Two times a day (BID) | ORAL | 0 refills | Status: AC

## 2017-09-24 NOTE — Patient Instructions (Addendum)
START THE TAKING SEPTRA DS TWICE DAILY FOR THE INFECTION UNDER YOUR BREAST AND IN YOUR GROIN   RESUME  ONCE A WEEK USE OF HIBICLENS  LIQUID SOAP USE DIAL SOAP DAILY     PLAN: MAKE SURE YOUR NPH DOSE IS NOT OVER 40 UNITS BEFORE DINNER .  Patient Instructions   Please continue: Insulin Before breakfast Before lunch Before dinner  Novolog 10 units with a smaller meal 15 units with a larger meal 10 units with a smaller meal 15 units with larger meal 20 units with a smaller meal 25 units with a regular meal  NPH 15 - 40               Continue Metformin 1000 mg 2x a day   Please come back for a follow-up appointment in 3 months with your log.

## 2017-09-24 NOTE — Progress Notes (Signed)
Subjective:  Patient ID: [name], female    DOB: 1961/02/26  Age: 57 y.o. MRN: [id number]  CC: Diagnoses of Breast mass in female, Need for immunization against influenza, HIDRADENITIS SUPPURATIVA, and Uncontrolled type 2 diabetes mellitus with retinopathy, with long-term current use of insulin ([place]) were pertinent to this visit.  HPI [name] presents for  Evaluation of boil under  breast.  The boil started as a painful lump a week ago. She developed one in her left groin as well  That has started draining .  Has not been using antibacterial sopa as previously directed.   Type 2 DM: Had a low BS of 42 this morning after her shower, occurred before her morning dose of insulin.  Felt fine.takes NPH bid before meals   Other times has been symptomatic at 78 .    Lab Results  Component Value Date   HGBA1C 6.2 07/23/2017    Outpatient Medications Prior to Visit  Medication Sig Dispense Refill  . atorvastatin (LIPITOR) 40 MG tablet TAKE ONE TABLET BY MOUTH ONCE DAILY 30 tablet 5  . cyclobenzaprine (FLEXERIL) 10 MG tablet Take 1 tablet (10 mg total) by mouth at bedtime. 30 tablet 5  . DULoxetine (CYMBALTA) 30 MG capsule Take one 30 mg tablet by mouth once a day for the first week. Then increase to two 30 mg tablets ( total 60mg ) by mouth once daily. 60 capsule 3  . gabapentin (NEURONTIN) 300 MG capsule Take 1 capsule (300 mg total) by mouth 2 (two) times daily. 180 capsule 1  . glucose blood (BAYER CONTOUR TEST) test strip TEST BLOOD SUGARS 3 TIMES DAILY 200 each 11  . insulin aspart (NOVOLOG) 100 UNIT/ML injection Inject into the skin 10-30 units before meals as advised 20 mL 2  . insulin NPH Human (NOVOLIN N RELION) 100 UNIT/ML injection Inject 15 units under skin in the AM and 35 units in the evening 30 mL 5  . [employer] (EASY TOUCH LANCING DEVICE) MISC Use to check diabetes 4 times daily  E 11.65 1 each 11  . lidocaine (LIDODERM) 5 % Place 1 patch onto the skin daily.  Reported on 06/19/2016 30 patch 1  . metFORMIN (GLUCOPHAGE) 1000 MG tablet TAKE ONE TABLET BY MOUTH TWICE DAILY WITH A MEAL 60 tablet 5  . NOVOLIN N RELION 100 UNIT/ML injection INJECT 20 UNITS UNDER THE SKIN IN THE MORNING AND 55 UNITS IN THE EVENING 10 vial 11  . NOVOLIN R RELION 100 UNIT/ML injection INJECT UP TO 50 UNITS SUBCUTANEOUSLY ONCE DAILY AS DIRECTED 10 mL 5  . nystatin (NYSTATIN) powder Apply powder to affected area twice daily until rash has resolved 15 g 3  . omeprazole (PRILOSEC) 40 MG capsule Take 1 capsule (40 mg total) by mouth daily. 30 capsule 3   No facility-administered medications prior to visit.     Review of Systems;  Patient denies headache, fevers, malaise, unintentional weight loss, skin rash, eye pain, sinus congestion and sinus pain, sore throat, dysphagia,  hemoptysis , cough, dyspnea, wheezing, chest pain, palpitations, orthopnea, edema, abdominal pain, nausea, melena, diarrhea, constipation, flank pain, dysuria, hematuria, urinary  Frequency, nocturia, numbness, tingling, seizures,  Focal weakness, Loss of consciousness,  Tremor, insomnia, depression, anxiety, and suicidal ideation.      Objective:  BP 138/90 (BP Location: Left Arm, Patient Position: Sitting, Cuff Size: Normal)   Pulse 85   Temp 98.1 F (36.7 C) (Oral)   Resp 15   Ht 5'  4" (1.626 m)   Wt 195 lb 3.2 oz (88.5 kg)   SpO2 98%   BMI 33.51 kg/m   BP Readings from Last 3 Encounters:  09/24/17 138/90  08/16/17 132/86  08/03/17 (!) 146/88    Wt Readings from Last 3 Encounters:  09/24/17 195 lb 3.2 oz (88.5 kg)  08/16/17 194 lb (88 kg)  08/03/17 190 lb (86.2 kg)    General appearance: alert, cooperative and appears stated age EBack: symmetric, no curvature. ROM normal. No CVA tenderness. Lungs: clear to auscultation bilaterally Heart: regular rate and rhythm, S1, S2 normal, no murmur, click, rub or gallop Abdomen: soft, non-tender; bowel sounds normal; no masses,  no  organomegaly Pulses: 2+ and symmetric Skin: small boil under right breast,  Another in Left groin  Lymph nodes: Cervical, supraclavicular, and axillary nodes normal.  Lab Results  Component Value Date   HGBA1C 6.2 07/23/2017   HGBA1C 6.8 04/09/2017   HGBA1C 6.4 01/08/2017    Lab Results  Component Value Date   CREATININE 0.64 07/22/2017   CREATININE 0.49 12/18/2016   CREATININE 0.63 06/19/2016    Lab Results  Component Value Date   WBC 9.2 07/22/2017   HGB 12.7 07/22/2017   HCT 39.8 07/22/2017   PLT 406.0 (H) 07/22/2017   GLUCOSE 105 (H) 07/22/2017   CHOL 135 07/22/2017   TRIG 95.0 07/22/2017   HDL 45.60 07/22/2017   LDLDIRECT 94.0 09/25/2015   LDLCALC 70 07/22/2017   ALT 34 07/22/2017   AST 21 07/22/2017   NA 139 07/22/2017   K 3.9 07/22/2017   CL 105 07/22/2017   CREATININE 0.64 07/22/2017   BUN 16 07/22/2017   CO2 27 07/22/2017   TSH 1.75 11/09/2016   HGBA1C 6.2 07/23/2017   MICROALBUR <0.7 06/23/2017    No results found.  Assessment & Plan:   Problem List Items Addressed This Visit    HIDRADENITIS SUPPURATIVA    Septra DS ,  Hot compresses.  Right breast,  Left groin.       Uncontrolled type 2 diabetes mellitus with retinopathy, with long-term current use of insulin ([facility])    Recurrent hypoglycemia in the am.  Reduce evening NPH to 40 units        Other Visit Diagnoses    Breast mass in female       Relevant Medications   sulfamethoxazole-trimethoprim (SEPTRA DS) 800-160 MG tablet   Need for immunization against influenza       Relevant Orders   Flu Vaccine QUAD 36+ mos IM (Completed)      I have changed Ms. Newton's sulfamethoxazole-trimethoprim. I am also having her maintain her EASY TOUCH LANCING DEVICE, glucose blood, metFORMIN, insulin aspart, NOVOLIN R RELION, omeprazole, cyclobenzaprine, insulin NPH Human, NOVOLIN N RELION, atorvastatin, lidocaine, gabapentin, DULoxetine, and nystatin.  Meds ordered this encounter  Medications  .  sulfamethoxazole-trimethoprim (SEPTRA DS) 800-160 MG tablet    Sig: Take 1 tablet by mouth 2 (two) times daily.    Dispense:  14 tablet    Refill:  0    Medications Discontinued During This Encounter  Medication Reason  . sulfamethoxazole-trimethoprim (SEPTRA DS) 800-160 MG per tablet Reorder    Follow-up: Return in about 6 months (around 03/25/2018).   Crecencio Mc, MD

## 2017-09-25 NOTE — Assessment & Plan Note (Signed)
Recurrent hypoglycemia in the am.  Reduce evening NPH to 40 units

## 2017-09-25 NOTE — Assessment & Plan Note (Signed)
Septra DS ,  Hot compresses.  Right breast,  Left groin.

## 2017-09-27 ENCOUNTER — Ambulatory Visit: Payer: Self-pay | Admitting: Internal Medicine

## 2017-10-06 ENCOUNTER — Encounter: Payer: Self-pay | Admitting: Family Medicine

## 2017-10-06 ENCOUNTER — Ambulatory Visit: Payer: Self-pay

## 2017-10-06 ENCOUNTER — Other Ambulatory Visit: Payer: BC Managed Care – PPO

## 2017-10-06 ENCOUNTER — Ambulatory Visit: Payer: BC Managed Care – PPO | Admitting: Family Medicine

## 2017-10-06 VITALS — BP 122/78 | HR 100 | Ht 64.0 in | Wt 193.0 lb

## 2017-10-06 DIAGNOSIS — M25562 Pain in left knee: Principal | ICD-10-CM

## 2017-10-06 DIAGNOSIS — M23204 Derangement of unspecified medial meniscus due to old tear or injury, left knee: Secondary | ICD-10-CM | POA: Diagnosis not present

## 2017-10-06 DIAGNOSIS — G8929 Other chronic pain: Secondary | ICD-10-CM | POA: Diagnosis not present

## 2017-10-06 NOTE — Patient Instructions (Signed)
Good to see you  Ice is your friend Perimeniscal cyst Consider compression sleeve daily  pennsaid pinkie amount topically 2 times daily as needed.   If not a lot better in 2 weeks we will need MRI  See me again in 4 weeks

## 2017-10-06 NOTE — Assessment & Plan Note (Signed)
Patient does have a cyst of the meniscus.  It was aspirated today.  Patient did fairly well.  No much possible buckle handle that could be an MRI may be necessary.  Think patient could be a candidate for arthroscopic procedure if needed.  We discussed icing regimen, home exercise again.  Patient declined any bracing and formal physical therapy.  Follow-up again in 3-4 weeks

## 2017-10-06 NOTE — Progress Notes (Signed)
[name] [address] [place], [place] [address] Phone: [phone] Subjective:    I'm seeing this patient by the request  of:    CC: Left knee pain  IRJ:[id number]  [name] is a 56 y.o. female coming in left knee pain. She has constant, sharp pain on the medial aspect of the left knee. Deep knee bending causes her the most pain. She also states that the right knee is bothering her.  Patient has been seen previously and does have arthritic changes of the knee as well as a meniscal tear.  Patient was to get an MRI patient states that another seems to be swelling.  Sometimes can be severe enough that stops her from activity.  Sometimes some mild instability.      Past Medical History:  Diagnosis Date  . Arthritis   . Campylobacter enteritis May 2012   [facility] admission  . Carpal tunnel syndrome, bilateral   . Diabetes mellitus   . Gastritis with bleeding due to alcohol 2010   [facility] admission  . GERD (gastroesophageal reflux disease)   . Hyperlipidemia   . Hypertension    no meds  . Wears glasses    Past Surgical History:  Procedure Laterality Date  . CYST EXCISION  1995   under arms  . endometrial biopsy  2004   benign  . FOOT OSTEOTOMY  2010   right-spurs  . TONSILLECTOMY     Social History   Socioeconomic History  . Marital status: Single    Spouse name: None  . Number of children: None  . Years of education: None  . Highest education level: None  Social Needs  . Financial resource strain: None  . Food insecurity - worry: None  . Food insecurity - inability: None  . Transportation needs - medical: None  . Transportation needs - non-medical: None  Occupational History  . None  Tobacco Use  . Smoking status: Former Smoker    Last attempt to quit: 05/09/2012    Years since quitting: 5.4  . Smokeless tobacco: Never Used  Substance and Sexual Activity  . Alcohol use: No    Comment: not for 7 yr  . Drug use: Yes    Types:  Marijuana    Comment: sometimes  . Sexual activity: Yes    Comment: same partner x 10 years  Other Topics Concern  . 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   She lives at home with daughter and grandson.   Allergies  Allergen Reactions  . Oxycodone-Acetaminophen     REACTION: Unknown reaction  . Penicillins Rash   Family History  Problem Relation Age of Onset  . Hypertension Mother   . Stroke Father   . Diabetes Maternal Grandmother   . Hypertension Maternal Grandmother      Past medical history, social, surgical and family history all reviewed in electronic medical record.  No pertanent information unless stated regarding to the chief complaint.   Review of Systems:Review of systems updated and as accurate as of 10/06/17  No headache, visual changes, nausea, vomiting, diarrhea, constipation, dizziness, abdominal pain, skin rash, fevers, chills, night sweats, weight loss, swollen lymph nodes, body aches, chest pain, shortness of breath, mood changes.  Positive muscle aches and joint swelling  Objective  Blood pressure 122/78, pulse 100, height 5\' 4"  (1.626 m), weight 193 lb (87.5 kg), SpO2 93 %. Systems examined below as of  10/06/17   General: No apparent distress alert and oriented x3 mood and affect normal, dressed appropriately.  HEENT: Pupils equal, extraocular movements intact  Respiratory: Patient's speak in full sentences and does not appear short of breath  Cardiovascular: No lower extremity edema, non tender, no erythema  Skin: Warm dry intact with no signs of infection or rash on extremities or on axial skeleton.  Abdomen: Soft nontender  Neuro: Cranial nerves II through XII are intact, neurovascularly intact in all extremities with 2+ DTRs and 2+ pulses.  Lymph: No lymphadenopathy of posterior or anterior cervical chain or axillae bilaterally.  Gait normal with good balance and coordination.  MSK:  Non tender with full range  of motion and good stability and symmetric strength and tone of shoulders, elbows, wrist, hip, and ankles bilaterally.  [place]  Inspection shows the patient does have swelling over the medial aspect of the knee Tender over the medial joint line ROM full in flexion and extension and lower leg rotation. Ligaments with solid consistent endpoints including ACL, PCL, LCL, MCL. Negative Mcmurray's, Apley's, and Thessalonian tests. Mild painful patellar compression. Patellar glide with moderate crepitus. Patellar and quadriceps tendons unremarkable. Hamstring and quadriceps strength is normal. Contralateral knee unremarkable  MSK US performed of: Left knee This study was ordered, performed, and interpreted by Charlann Boxer D.O.  Knee: Medial joint space shows some mild to moderate arthritic changes.  Patient does have a large meniscal tear and this seems to be chronic.  Patient does have a very large peri-meniscal cyst   IMPRESSION: Peri-meniscal cyst  Procedure: Real-time Ultrasound Guided Injection of left knee.  Meniscal cyst Device: GE Logiq Q7 Ultrasound guided injection is preferred based studies that show increased duration, increased effect, greater accuracy, decreased procedural pain, increased response rate, and decreased cost with ultrasound guided versus blind injection.  Verbal informed consent obtained.  Time-out conducted.  Noted no overlying erythema, induration, or other signs of local infection.  Skin prepped in a sterile fashion.  Local anesthesia: Topical Ethyl chloride.  With sterile technique and under real time ultrasound guidance: With a 22-gauge 2 inch needle patient was injected with 4 cc of 0.5% Marcaine and aspirated 15 cc of straw-colored fluid from the peri-meniscal cyst and then injected 1 cc of Kenalog 40 mg/dL. This was from a medial approach Completed without difficulty  Pain immediately resolved suggesting accurate placement of the medication.  Advised to  call if fevers/chills, erythema, induration, drainage, or persistent bleeding.  Images permanently stored and available for review in the ultrasound unit.  Impression: Technically successful ultrasound guided injection..   Impression and Recommendations:     This case required medical decision making of moderate complexity.      Note: This dictation was prepared with Dragon dictation along with smaller phrase technology. Any transcriptional errors that result from this process are unintentional.

## 2017-10-07 ENCOUNTER — Telehealth: Payer: Self-pay | Admitting: Family Medicine

## 2017-10-07 NOTE — Telephone Encounter (Signed)
Dr. Tamala Julian agrees.

## 2017-10-07 NOTE — Telephone Encounter (Signed)
Patient called in wanting to know if she could get compression sleeves from Dr. Tamala Julian.  I have advised patient to go to a pharmacy.  If patient needs to do otherwise please follow up in regard.

## 2017-10-08 LAB — SYNOVIAL CELL COUNT + DIFF, W/ CRYSTALS
Basophils, %: 0 %
Eosinophils-Synovial: 0 % (ref 0–2)
Lymphocytes-Synovial Fld: 24 % (ref 0–74)
Monocyte/Macrophage: 64 % (ref 0–69)
Neutrophil, Synovial: 4 % (ref 0–24)
Synoviocytes, %: 8 % (ref 0–15)
WBC, SYNOVIAL: 173 {cells}/uL — AB (ref <150)

## 2017-10-08 LAB — URIC ACID, SYNOVIAL FLUID: URIC ACID, SYNOVIAL FLUID: 4 mg/dL (ref <6.0)

## 2017-11-01 ENCOUNTER — Encounter: Payer: Self-pay | Admitting: Internal Medicine

## 2017-11-01 ENCOUNTER — Ambulatory Visit: Payer: BC Managed Care – PPO | Admitting: Internal Medicine

## 2017-11-01 ENCOUNTER — Ambulatory Visit: Payer: Self-pay | Admitting: Family Medicine

## 2017-11-01 VITALS — BP 140/80 | HR 85 | Wt 189.4 lb

## 2017-11-01 DIAGNOSIS — E11319 Type 2 diabetes mellitus with unspecified diabetic retinopathy without macular edema: Secondary | ICD-10-CM

## 2017-11-01 DIAGNOSIS — E669 Obesity, unspecified: Secondary | ICD-10-CM | POA: Diagnosis not present

## 2017-11-01 DIAGNOSIS — E1165 Type 2 diabetes mellitus with hyperglycemia: Secondary | ICD-10-CM

## 2017-11-01 DIAGNOSIS — Z6832 Body mass index (BMI) 32.0-32.9, adult: Secondary | ICD-10-CM

## 2017-11-01 DIAGNOSIS — Z794 Long term (current) use of insulin: Secondary | ICD-10-CM

## 2017-11-01 DIAGNOSIS — IMO0002 Reserved for concepts with insufficient information to code with codable children: Secondary | ICD-10-CM

## 2017-11-01 LAB — POCT GLYCOSYLATED HEMOGLOBIN (HGB A1C): HEMOGLOBIN A1C: 6.5

## 2017-11-01 MED ORDER — INSULIN NPH (HUMAN) (ISOPHANE) 100 UNIT/ML ~~LOC~~ SUSP: SUBCUTANEOUS | 5 refills | Status: DC

## 2017-11-01 MED ORDER — INSULIN REGULAR HUMAN 100 UNIT/ML IJ SOLN: INTRAMUSCULAR | 5 refills | Status: DC

## 2017-11-01 MED ORDER — FREESTYLE LIBRE 14 DAY READER DEVI: 1.0000 | Freq: Once | 1 refills | Status: AC

## 2017-11-01 MED ORDER — FREESTYLE LIBRE 14 DAY SENSOR MISC: 1.0000 | 11 refills | Status: DC

## 2017-11-01 NOTE — Addendum Note (Signed)
Addended by: Drucilla Schmidt on: 11/01/2017 04:42 PM   Modules accepted: Orders

## 2017-11-01 NOTE — Progress Notes (Signed)
Patient ID: [name], female   DOB: 1960/12/02, 56 y.o.   MRN: [id number]  HPI: [name] is a 56 y.o.-year-old female, returning for f/u for DM2, dx ~2000, insulin-dependent since 2010, uncontrolled, with complications (PN, + DR). Last visit 3 months ago:  Last hemoglobin A1c was: Lab Results  Component Value Date   HGBA1C 6.2 07/23/2017   HGBA1C 6.8 04/09/2017   HGBA1C 6.4 01/08/2017   Pt is on: - Metformin 1000 mg 2x a day   Insulin Before breakfast Before lunch Before dinner  Novolin R 10 units with a smaller meal 15 units with a larger meal 10 units with a smaller meal 15 units with larger meal 20 units with a smaller meal 25 units with a regular meal  NPH 15 - 40 >> using 25 units    Pt is checking her sugars 1-3 times a day: - am:   45 (3x), 110-120, 200s if skips doses >> 38, 45, 76-154, 189 >> 108-118, 2013 - 2h after b'fast: 120-195 >> 154 >> n/c > 203 >> n/c - lunch:  47, 79 (more active at work) >> 47, 57, 60-167, 187 >> 130-140s >> 61-71 >> 90s - mid-afternoon:  40x1, 78-159, 167 >> n/c >> 106-149, 298 >> n/c >> 92-155 >> 45x1 , 90-115 - before dinner:60, 98-230, 255 >> n/c >> 46, 229 >> n/c >> ? >> 135 - 2h after dinner: 72, 114-292 >> n/c >> 216, 219, 451 >> n/c  - bedtime:   up to 300 >> 94-164 >> 92-240 >> n/c >> 90-153, 202, 266 (fried potatoes) >> 100-150s - nighttime: 145-239, 338 >> n/c Lowest sugar was 45 >> 38 x1, 45 x1; she has hypoglycemia awareness at 80.  Highest sugar was  279 >> 200s >> 203.  -No CKD, last BUN/creatinine:  Lab Results  Component Value Date   BUN 16 07/22/2017   CREATININE 0.64 07/22/2017  Last ACR 0.5 06/19/13. Not on ACEI/ARB.  -+ HL; last set of lipids: Lab Results  Component Value Date   CHOL 135 07/22/2017   HDL 45.60 07/22/2017   LDLCALC 70 07/22/2017   LDLDIRECT 94.0 09/25/2015   TRIG 95.0 07/22/2017   CHOLHDL 3 07/22/2017  On Lipitor.  - last eye exam was in 03/2017 >> + DR L>R. + glaucoma Dr Arnoldo Morale.  Has drops >> improved. - + numbness and tingling in her feet. Seeing a podiatrist. On Neurontin 2 tabs a day  She also has a history of hypertension, hyperlipidemia-on Lipitor, episodic alcohol abuse, GERD, low back pain, osteoarthritis.   ROS: Constitutional: no weight gain/no weight loss, no fatigue, no subjective hyperthermia, no subjective hypothermia Eyes: no blurry vision, no xerophthalmia ENT: no sore throat, no nodules palpated in throat, no dysphagia, no odynophagia, no hoarseness Cardiovascular: no CP/no SOB/no palpitations/no leg swelling Respiratory: no cough/no SOB/no wheezing Gastrointestinal: no N/no V/no D/no C/no acid reflux Musculoskeletal: no muscle aches/no joint aches Skin: no rashes, no hair loss Neurological: no tremors/+ numbness/+ tingling/no dizziness  I reviewed pt's medications, allergies, PMH, social hx, family hx, and changes were documented in the history of present illness. Otherwise, unchanged from my initial visit note.  PE: BP 140/80   Pulse 85   Wt 189 lb 6.4 oz (85.9 kg)   SpO2 98%   BMI 32.51 kg/m  Body mass index is 32.51 kg/m. Wt Readings from Last 3 Encounters:  11/01/17 189 lb 6.4 oz (85.9 kg)  10/06/17 193 lb (87.5 kg)  09/24/17 195 lb  3.2 oz (88.5 kg)   Constitutional: overweight, in NAD Eyes: PERRLA, EOMI, no exophthalmos ENT: moist mucous membranes, no thyromegaly, no cervical lymphadenopathy Cardiovascular: RRR, No MRG Respiratory: CTA B Gastrointestinal: abdomen soft, NT, ND, BS+ Musculoskeletal: no deformities, strength intact in all 4 Skin: moist, warm, no rashes Neurological: no tremor with outstretched hands, DTR normal in all 4  ASSESSMENT: 1. DM2, insulin-dependent, uncontrolled, with complications - peripheral neuropathy - DR   2. Obesity class 1 - BMI 32.79  BMI Classification:  < 18.5 underweight   18.5-24.9 normal weight   25.0-29.9 overweight   30.0-34.9 class I obesity   35.0-39.9 class II obesity    ? 40.0 class III obesity   PLAN:  1. Patient with long-standing, previously uncontrolled diabetes, now doing a great job on a basal-bolus insulin regimen with NPH and R insulin.  She is taking her injections as prescribed but is taking a lower dose of NPH then discussed at last visit.  Even with this low dose, her sugars are at or close to goal throughout the day.  She only had one low blood sugar when she was very active the day and delayed her meal.  We discussed to avoid doing this. - Otherwise, no changes are needed in her regimen for now - She is interested in a freestyle libre CGM.  We discussed about how this works and I will call it into her pharmacy. - I advised her to: Patient Instructions   Please continue: Insulin Before breakfast Before lunch Before dinner  R 10 units with a smaller meal 15 units with a larger meal 10 units with a smaller meal 15 units with larger meal 20 units with a smaller meal 25 units with a regular meal  NPH 15 - 25    Continue Metformin 1000 mg 2x a day   Please come back for a follow-up appointment in 3-4 months with your log.  - today, HbA1c is 6.5% (slightly higher, but still at goal)  - continue checking sugars at different times of the day - check 3x a day, rotating checks - advised for yearly eye exams >> she is UTD - Return to clinic in 3 mo with sugar log   2. Obesity class 1 - Again discussed about the importance of diet >> she gained some weight since last visit but was able to lose this >> net -2 lbs  Philemon Kingdom, MD PhD [facility] Endocrinology

## 2017-11-01 NOTE — Patient Instructions (Addendum)
Please continue: Insulin Before breakfast Before lunch Before dinner  R 10 units with a smaller meal 15 units with a larger meal 10 units with a smaller meal 15 units with larger meal 20 units with a smaller meal 25 units with a regular meal  NPH 15 - 25    Continue Metformin 1000 mg 2x a day   Please come back for a follow-up appointment in 3-4 months with your log.

## 2017-11-02 ENCOUNTER — Telehealth: Payer: Self-pay | Admitting: *Deleted

## 2017-11-02 NOTE — Telephone Encounter (Signed)
Copied from [place]. Topic: Inquiry >> Nov 01, 2017  1:43 PM Bea Graff, NT wrote: Reason for CRM: [facility]abetic supplies will be calling on the pts behalf to request refills. Pt gives permission to speak with them regarding her meds.

## 2017-11-05 NOTE — Telephone Encounter (Signed)
Spoke with pt and she stated that she does want [name] to fax in the order from [facility] for the Diabetic supplies. Form has been filled out and placed in quick sign folder. Pt is aware that Dr. Derrel Nip will not be in the office until possibly Tuesday next week.

## 2017-11-09 ENCOUNTER — Ambulatory Visit: Payer: Self-pay | Admitting: Family Medicine

## 2017-11-18 ENCOUNTER — Encounter: Payer: Self-pay | Admitting: Family Medicine

## 2017-11-18 ENCOUNTER — Ambulatory Visit: Payer: BC Managed Care – PPO | Admitting: Family Medicine

## 2017-11-18 DIAGNOSIS — M23204 Derangement of unspecified medial meniscus due to old tear or injury, left knee: Secondary | ICD-10-CM | POA: Diagnosis not present

## 2017-11-18 NOTE — Assessment & Plan Note (Signed)
Patient given an injection today.  We discussed with patient that I do feel an MRI would be necessary and likely possible surgical intervention if patient continues to have intermittent locking.  Patient states that she wants to continue with conservative therapy otherwise though.  Patient declined any more physical therapy at this point.  Patient will follow up with me again in 3-4 weeks.

## 2017-11-18 NOTE — Progress Notes (Signed)
[name] [address], [place] [address] Phone: [phone] Subjective:    I'm seeing this patient by the request  of:    CC: Left knee follow-up  [id number]  [name] is a 56 y.o. female coming in with complaint of left knee.  Patient had been seen previously and found to have minimal osteoarthritic changes.  Patient also found to have what appeared to be a chronic meniscal tear with a peri-meniscal cyst.  Patient attempted him exercises.  Patient has been doing icing regimen, vitamin D as well.  States that she has not made any improvement and if anything worsening.  Rates the severity of pain is 7 out of 10.  Seems to be catching on her on a regular basis.  Affecting daily activities       Past Medical History:  Diagnosis Date  . Arthritis   . Campylobacter enteritis May 2012   [facility] admission  . Carpal tunnel syndrome, bilateral   . Diabetes mellitus   . Gastritis with bleeding due to alcohol 2010   [facility] admission  . GERD (gastroesophageal reflux disease)   . Hyperlipidemia   . Hypertension    no meds  . Wears glasses    Past Surgical History:  Procedure Laterality Date  . CARPAL TUNNEL RELEASE Left 05/14/2014   Procedure: LEFT ULNAR NEUROPLASTY AT ELBOW AND ENDOSCOPIC CARPAL TUNNEL RELEASE;  Surgeon: Jolyn Nap, MD;  Location: [place];  Service: Orthopedics;  Laterality: Left;  . CYST EXCISION  1995   under arms  . endometrial biopsy  2004   benign  . FOOT OSTEOTOMY  2010   right-spurs  . TONSILLECTOMY    . ULNAR NERVE TRANSPOSITION Left 05/14/2014   Procedure: ULNAR NERVE DECOMPRESSION/TRANSPOSITION;  Surgeon: Jolyn Nap, MD;  Location: [place];  Service: Orthopedics;  Laterality: Left;   Social History   Socioeconomic History  . Marital status: Single    Spouse name: None  . Number of children: None  . Years of education: None  . Highest education level: None    Social Needs  . Financial resource strain: None  . Food insecurity - worry: None  . Food insecurity - inability: None  . Transportation needs - medical: None  . Transportation needs - non-medical: None  Occupational History  . None  Tobacco Use  . Smoking status: Former Smoker    Last attempt to quit: 05/09/2012    Years since quitting: 5.5  . Smokeless tobacco: Never Used  Substance and Sexual Activity  . Alcohol use: No    Comment: not for 7 yr  . Drug use: Yes    Types: Marijuana    Comment: sometimes  . Sexual activity: Yes    Comment: same partner x 10 years  Other Topics Concern  . 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   She lives at home with daughter and grandson.   Allergies  Allergen Reactions  . Oxycodone-Acetaminophen     REACTION: Unknown reaction  . Penicillins Rash   Family History  Problem Relation Age of Onset  . Hypertension Mother   . Stroke Father   . Diabetes Maternal Grandmother   . Hypertension Maternal Grandmother      Past medical history, social, surgical and family history all reviewed in electronic medical record.  No pertanent information unless stated regarding to the chief complaint.  Review of Systems:Review of systems updated and as accurate as of 11/18/17  No headache, visual changes, nausea, vomiting, diarrhea, constipation, dizziness, abdominal pain, skin rash, fevers, chills, night sweats, weight loss, swollen lymph nodes, chest pain, shortness of breath, mood changes.  Positive joint swelling and muscle aches  Objective  Blood pressure 130/84, pulse 75, height 5\' 4"  (1.626 m), weight 192 lb (87.1 kg), SpO2 96 %. Systems examined below as of 11/18/17   General: No apparent distress alert and oriented x3 mood and affect normal, dressed appropriately.  HEENT: Pupils equal, extraocular movements intact  Respiratory: Patient's speak in full sentences and does not appear short of breath   Cardiovascular: No lower extremity edema, non tender, no erythema  Skin: Warm dry intact with no signs of infection or rash on extremities or on axial skeleton.  Abdomen: Soft nontender  Neuro: Cranial nerves II through XII are intact, neurovascularly intact in all extremities with 2+ DTRs and 2+ pulses.  Lymph: No lymphadenopathy of posterior or anterior cervical chain or axillae bilaterally.  Gait normal with good balance and coordination.  MSK:  Non tender with full range of motion and good stability and symmetric strength and tone of shoulders, elbows, wrist, hip, and ankles bilaterally.  Left knee exam shows the patient does have a small effusion.  More tenderness over the medial joint line.  Mild crepitus noted with range of motion.  Severely tender to palpation over the medial joint line  After informed written and verbal consent, patient was seated on exam table. Left knee was prepped with alcohol swab and utilizing anterolateral approach, patient's left knee space was injected with 4:1  marcaine 0.5%: Kenalog 40mg /dL. Patient tolerated the procedure well without immediate complications.   Impression and Recommendations:     This case required medical decision making of moderate complexity.      Note: This dictation was prepared with Dragon dictation along with smaller phrase technology. Any transcriptional errors that result from this process are unintentional.

## 2017-11-18 NOTE — Patient Instructions (Signed)
Good to see you.  Happy holidays!  Stay active  Ice 20 minutes 2 times daily. Usually after activity and before bed.  pennsaid pinkie amount topically 2 times daily as needed.  Physical therapy will be calling you  See me again in 4 weeks.

## 2017-12-07 ENCOUNTER — Telehealth: Payer: Self-pay

## 2017-12-07 NOTE — Telephone Encounter (Signed)
I cannot write that letter unless I see the prescriptiosns.. Nothing is in the chart

## 2017-12-07 NOTE — Telephone Encounter (Signed)
Altamese Dilling from [facility] called regarding an audit for two patient from the office. He needs clarification on rx's that were sent.   [phone].

## 2017-12-07 NOTE — Telephone Encounter (Signed)
Spoke with Elta Guadeloupe and he stated that [facility] which is where he was calling from stated that they are being audited. He stated that he needs a letter on our letterhead stating that you signed the rx they faxed over at the beginning of the year for the following medications: Calcipotrien .005%, Fluocinonide cream .1%, naproxen sod 375mg , and chlorzoxazone 250mg .

## 2017-12-08 NOTE — Telephone Encounter (Signed)
aTtempted to call Altamese Dilling back to let him know of Dr. Lupita Dawn message below. No answer. No voicemail. Will try to call back later.

## 2017-12-10 ENCOUNTER — Telehealth: Payer: Self-pay | Admitting: Internal Medicine

## 2017-12-10 MED ORDER — DICLOFENAC SODIUM 2 % TD SOLN: TRANSDERMAL | 3 refills | Status: DC

## 2017-12-10 NOTE — Telephone Encounter (Signed)
Copied from [place] [id number]. Topic: Quick Communication - Rx Refill/Question >> Dec 10, 2017  8:32 AM Marin Olp L wrote: Medication: diclofanac 150mg  gel Has the patient contacted their pharmacy? Yes.   (Agent: If no, request that the patient contact the pharmacy for the refill.) Preferred Pharmacy (with phone number or street name): [facility] Agent: Please be advised that RX refills may take up to 3 business days. We ask that you follow-up with your pharmacy.

## 2017-12-10 NOTE — Telephone Encounter (Signed)
Copied from [place] [phone]. Topic: Quick Communication - Rx Refill/Question >> Dec 10, 2017  8:32 AM Marin Olp L wrote: Medication: diclofanac 150mg  gel Has the patient contacted their pharmacy? Yes.   (Agent: If no, request that the patient contact the pharmacy for the refill.) Preferred Pharmacy (with phone number or street name): [facility] Agent: Please be advised that RX refills may take up to 3 business days. We ask that you follow-up with your pharmacy.

## 2017-12-10 NOTE — Telephone Encounter (Signed)
Requesting refill of Diclifanac Gel  LOV 11/18/17 with Dr. Tamala Julian

## 2017-12-10 NOTE — Telephone Encounter (Signed)
pennsaid sent into [facility].

## 2017-12-13 NOTE — Telephone Encounter (Signed)
Attempted to call [facility] but was not able to get in touch with [name] and there is no voicemail.

## 2017-12-31 ENCOUNTER — Encounter: Payer: Self-pay | Admitting: Internal Medicine

## 2017-12-31 ENCOUNTER — Ambulatory Visit: Payer: BC Managed Care – PPO | Admitting: Internal Medicine

## 2017-12-31 ENCOUNTER — Ambulatory Visit (INDEPENDENT_AMBULATORY_CARE_PROVIDER_SITE_OTHER): Payer: BC Managed Care – PPO

## 2017-12-31 VITALS — BP 126/72 | HR 89 | Temp 98.2°F | Resp 16 | Ht 64.0 in | Wt 183.0 lb

## 2017-12-31 DIAGNOSIS — R110 Nausea: Secondary | ICD-10-CM

## 2017-12-31 DIAGNOSIS — R05 Cough: Secondary | ICD-10-CM

## 2017-12-31 DIAGNOSIS — M545 Low back pain: Secondary | ICD-10-CM

## 2017-12-31 DIAGNOSIS — E1169 Type 2 diabetes mellitus with other specified complication: Secondary | ICD-10-CM | POA: Diagnosis not present

## 2017-12-31 DIAGNOSIS — Z23 Encounter for immunization: Secondary | ICD-10-CM | POA: Diagnosis not present

## 2017-12-31 DIAGNOSIS — I10 Essential (primary) hypertension: Secondary | ICD-10-CM | POA: Diagnosis not present

## 2017-12-31 DIAGNOSIS — B9789 Other viral agents as the cause of diseases classified elsewhere: Secondary | ICD-10-CM | POA: Diagnosis not present

## 2017-12-31 DIAGNOSIS — D649 Anemia, unspecified: Secondary | ICD-10-CM | POA: Diagnosis not present

## 2017-12-31 DIAGNOSIS — R059 Cough, unspecified: Secondary | ICD-10-CM

## 2017-12-31 DIAGNOSIS — J069 Acute upper respiratory infection, unspecified: Secondary | ICD-10-CM

## 2017-12-31 DIAGNOSIS — E785 Hyperlipidemia, unspecified: Secondary | ICD-10-CM | POA: Diagnosis not present

## 2017-12-31 DIAGNOSIS — G8929 Other chronic pain: Secondary | ICD-10-CM

## 2017-12-31 MED ORDER — GUAIFENESIN-CODEINE 100-10 MG/5ML PO SYRP: 5.0000 mL | ORAL_SOLUTION | Freq: Three times a day (TID) | ORAL | 0 refills | Status: DC | PRN

## 2017-12-31 MED ORDER — PREDNISONE 10 MG PO TABS: ORAL_TABLET | ORAL | 0 refills | Status: DC

## 2017-12-31 MED ORDER — TRAMADOL HCL 50 MG PO TABS: 50.0000 mg | ORAL_TABLET | Freq: Three times a day (TID) | ORAL | 0 refills | Status: DC | PRN

## 2017-12-31 NOTE — Patient Instructions (Signed)
You have a viral syndrome which is causing bronchitis.    The post nasal drip may also be contributing to  your  Cough.  I am prescribing a prednisone  Taper  to manage the inflammation in your bronchial tubes  And resolve your cough cheratussin cough syrup: has codeine it to help quiet the cough  Tramadol for the back pain   I also advise use of the following OTC meds to help with your other symptoms.    generic OTC benadryl 25 mg  At bedtime for the drainage,,   flush your sinuses twice daily with Milta Deiters Meds sinus sinse ,  If your chest x ray suggests you have pneumonia,  I will call in an  antibiotic   If you end up taking an antibiotic Please take a probiotic ( Align, Floraque or Culturelle) while you are on the antibiotic to prevent a serious antibiotic associated diarrhea  Called clostridium dificile colitis and a vaginal yeast infection

## 2017-12-31 NOTE — Progress Notes (Signed)
Subjective:  Patient ID: [name], female    DOB: 06-30-1961  Age: 57 y.o. MRN: [id number]  CC: The primary encounter diagnosis was Need for prophylactic vaccination against diphtheria-tetanus-pertussis (DTP). Diagnoses of Cough, Nausea, Essential hypertension, Chronic midline low back pain, with sciatica presence unspecified, Viral URI with cough, Hyperlipidemia associated with type 2 diabetes mellitus ([place]), and Anemia, unspecified type were also pertinent to this visit.  HPI [name] presents for follow up 6 months on hypertension, hyperlipidemia, and back pain  .    Overdue for pneumovax   Cc:  cough, with post tussive emesis  And nausea for the past 2 weeks .  Feeling feverish but no chills.  No body aches but backs and side hurting .  Using OTC  meds for cough include tussin,  Helping with the nighttime cough.  Sinus drainage is getting better.    2) back pain:  Chronic nonradiating.    Lab Results  Component Value Date   CHOL 135 07/22/2017   HDL 45.60 07/22/2017   LDLCALC 70 07/22/2017   LDLDIRECT 94.0 09/25/2015   TRIG 95.0 07/22/2017   CHOLHDL 3 07/22/2017      Lab Results  Component Value Date   HGBA1C 6.5 11/01/2017    Outpatient Medications Prior to Visit  Medication Sig Dispense Refill  . atorvastatin (LIPITOR) 40 MG tablet TAKE ONE TABLET BY MOUTH ONCE DAILY 30 tablet 5  . Continuous Blood Gluc Sensor (FREESTYLE LIBRE 14 DAY SENSOR) MISC 1 each by Does not apply route every 14 (fourteen) days. Change every 2 weeks 2 each 11  . cyclobenzaprine (FLEXERIL) 10 MG tablet Take 1 tablet (10 mg total) by mouth at bedtime. 30 tablet 5  . Diclofenac Sodium 2 % SOLN Apply 1 pump twice daily. 112 g 3  . DULoxetine (CYMBALTA) 30 MG capsule Take one 30 mg tablet by mouth once a day for the first week. Then increase to two 30 mg tablets ( total 60mg ) by mouth once daily. 60 capsule 3  . gabapentin (NEURONTIN) 300 MG capsule Take 1 capsule (300 mg total) by mouth 2  (two) times daily. 180 capsule 1  . glucose blood (BAYER CONTOUR TEST) test strip TEST BLOOD SUGARS 3 TIMES DAILY 200 each 11  . insulin NPH Human (NOVOLIN N RELION) 100 UNIT/ML injection Inject 15 units under skin in the AM and 25 units in the evening 30 mL 5  . insulin regular (NOVOLIN R RELION) 100 units/mL injection INJECT UP TO 50 UNITS SUBCUTANEOUSLY ONCE DAILY AS DIRECTED 20 mL 5  . Lancet Devices (EASY TOUCH LANCING DEVICE) MISC Use to check diabetes 4 times daily  E 11.65 1 each 11  . lidocaine (LIDODERM) 5 % Place 1 patch onto the skin daily. Reported on 06/19/2016 30 patch 1  . metFORMIN (GLUCOPHAGE) 1000 MG tablet TAKE ONE TABLET BY MOUTH TWICE DAILY WITH A MEAL 60 tablet 5  . nystatin (NYSTATIN) powder Apply powder to affected area twice daily until rash has resolved 15 g 3  . omeprazole (PRILOSEC) 40 MG capsule Take 1 capsule (40 mg total) by mouth daily. 30 capsule 3   No facility-administered medications prior to visit.     Review of Systems;  Patient denies headache, fevers, malaise, unintentional weight loss, skin rash, eye pain, sinus congestion and sinus pain, sore throat, dysphagia,  hemoptysis , cough, dyspnea, wheezing, chest pain, palpitations, orthopnea, edema, abdominal pain, nausea, melena, diarrhea, constipation, flank pain, dysuria, hematuria, urinary  Frequency, nocturia,  numbness, tingling, seizures,  Focal weakness, Loss of consciousness,  Tremor, insomnia, depression, anxiety, and suicidal ideation.      Objective:  BP 126/72   Pulse 89   Temp 98.2 F (36.8 C) (Oral)   Resp 16   Ht 5\' 4"  (1.626 m)   Wt 183 lb (83 kg)   SpO2 98%   BMI 31.41 kg/m   BP Readings from Last 3 Encounters:  12/31/17 126/72  11/18/17 130/84  11/01/17 140/80    Wt Readings from Last 3 Encounters:  12/31/17 183 lb (83 kg)  11/18/17 192 lb (87.1 kg)  11/01/17 189 lb 6.4 oz (85.9 kg)    General appearance: alert, cooperative and appears stated age Ears: normal TM's and  external ear canals both ears Throat: lips, mucosa, and tongue normal; teeth and gums normal Neck: no adenopathy, no carotid bruit, supple, symmetrical, trachea midline and thyroid not enlarged, symmetric, no tenderness/mass/nodules Back: symmetric, no curvature. ROM normal. No CVA tenderness. Lungs: clear to auscultation bilaterally Heart: regular rate and rhythm, S1, S2 normal, no murmur, click, rub or gallop Abdomen: soft, non-tender; bowel sounds normal; no masses,  no organomegaly Pulses: 2+ and symmetric Skin: Skin color, texture, turgor normal. No rashes or lesions Lymph nodes: Cervical, supraclavicular, and axillary nodes normal.  Lab Results  Component Value Date   HGBA1C 6.5 11/01/2017   HGBA1C 6.2 07/23/2017   HGBA1C 6.8 04/09/2017    Lab Results  Component Value Date   CREATININE 0.76 12/31/2017   CREATININE 0.64 07/22/2017   CREATININE 0.49 12/18/2016    Lab Results  Component Value Date   WBC 10.4 12/31/2017   HGB 11.4 (L) 12/31/2017   HCT 34.1 (L) 12/31/2017   PLT 459 (H) 12/31/2017   GLUCOSE 90 12/31/2017   CHOL 135 07/22/2017   TRIG 95.0 07/22/2017   HDL 45.60 07/22/2017   LDLDIRECT 94.0 09/25/2015   LDLCALC 70 07/22/2017   ALT 27 12/31/2017   AST 19 12/31/2017   NA 138 12/31/2017   K 4.0 12/31/2017   CL 104 12/31/2017   CREATININE 0.76 12/31/2017   BUN 12 12/31/2017   CO2 27 12/31/2017   TSH 1.75 11/09/2016   HGBA1C 6.5 11/01/2017   MICROALBUR <0.7 06/23/2017    No results found.  Assessment & Plan:   Problem List Items Addressed This Visit    Nausea   Relevant Orders   Comprehensive metabolic panel (Completed)   Anemia    Etiology unclear. She will return for additional testing       Relevant Orders   Iron, TIBC and Ferritin Panel   Vitamin B12   Folate RBC   Essential hypertension    Well controlled on current regimen. Renal function stable, no changes today.  Lab Results  Component Value Date   CREATININE 0.76 12/31/2017    Lab Results  Component Value Date   NA 138 12/31/2017   K 4.0 12/31/2017   CL 104 12/31/2017   CO2 27 12/31/2017         Hyperlipidemia associated with type 2 diabetes mellitus ([place])    She is tolerating a high potency statin without side effects.  LFTs are normal.   Lab Results  Component Value Date   CHOL 135 07/22/2017   HDL 45.60 07/22/2017   LDLCALC 70 07/22/2017   LDLDIRECT 94.0 09/25/2015   TRIG 95.0 07/22/2017   CHOLHDL 3 07/22/2017   Lab Results  Component Value Date   ALT 27 12/31/2017   AST 19 12/31/2017  ALKPHOS 82 07/22/2017   BILITOT 0.3 12/31/2017           LOW BACK PAIN    Continue gabapentin, tramadol and flexeril.       Relevant Medications   predniSONE (DELTASONE) 10 MG tablet   traMADol (ULTRAM) 50 MG tablet   Viral URI with cough    Her symptoms do not suggest that she has a bacterial infection and her chest x ray today is clear.  Advisedher that s he  does not need to take an antibiotic at this time.  The complications of inappropriate antibiotic use were discussed with patientnet including risk of clostridium dificile colitis.  advised to cancontinue the allegra and tylenol, add  sudafed PE  Up to 30 mg every 6 hours as needed to manage the sinus congestion and substitute Afrin nasal spray for the evening dose to avoid insomnia. I'll call in a prednisone taper and a cough suppressant  .  And will add antibiotic only if patient develops fever,  Purulent sputum ,  Or facial/ear pain.        Other Visit Diagnoses    Need for prophylactic vaccination against diphtheria-tetanus-pertussis (DTP)    -  Primary   Cough       Relevant Orders   DG Chest 2 View (Completed)   CBC with Differential/Platelet (Completed)    A total of 25 minutes of face to face time was spent with patient more than half of which was spent in counselling about the above mentioned conditions  and coordination of care  I am having [name] start on predniSONE,  traMADol, and guaiFENesin-codeine. I am also having her maintain her EASY TOUCH LANCING DEVICE, glucose blood, metFORMIN, omeprazole, cyclobenzaprine, atorvastatin, lidocaine, gabapentin, DULoxetine, nystatin, insulin NPH Human, insulin regular, FREESTYLE LIBRE 14 DAY SENSOR, and Diclofenac Sodium.  Meds ordered this encounter  Medications  . predniSONE (DELTASONE) 10 MG tablet    Sig: 6 tablets on Day 1 , then reduce by 1 tablet daily until gone    Dispense:  21 tablet    Refill:  0  . traMADol (ULTRAM) 50 MG tablet    Sig: Take 1 tablet (50 mg total) by mouth every 8 (eight) hours as needed.    Dispense:  30 tablet    Refill:  0  . guaiFENesin-codeine (CHERATUSSIN AC) 100-10 MG/5ML syrup    Sig: Take 5 mLs by mouth 3 (three) times daily as needed for cough.    Dispense:  120 mL    Refill:  0    There are no discontinued medications.  Follow-up: Return in about 6 months (around 06/30/2018) for hyperlipidemia .   Crecencio Mc, MD

## 2018-01-01 LAB — CBC WITH DIFFERENTIAL/PLATELET
BASOS PCT: 0.4 %
Basophils Absolute: 42 cells/uL (ref 0–200)
Eosinophils Absolute: 322 cells/uL (ref 15–500)
Eosinophils Relative: 3.1 %
HCT: 34.1 % — ABNORMAL LOW (ref 35.0–45.0)
Hemoglobin: 11.4 g/dL — ABNORMAL LOW (ref 11.7–15.5)
Lymphs Abs: 3598 cells/uL (ref 850–3900)
MCH: 28.8 pg (ref 27.0–33.0)
MCHC: 33.4 g/dL (ref 32.0–36.0)
MCV: 86.1 fL (ref 80.0–100.0)
MONOS PCT: 5 %
MPV: 9.1 fL (ref 7.5–12.5)
Neutro Abs: 5918 cells/uL (ref 1500–7800)
Neutrophils Relative %: 56.9 %
PLATELETS: 459 10*3/uL — AB (ref 140–400)
RBC: 3.96 10*6/uL (ref 3.80–5.10)
RDW: 12.2 % (ref 11.0–15.0)
TOTAL LYMPHOCYTE: 34.6 %
WBC mixed population: 520 cells/uL (ref 200–950)
WBC: 10.4 10*3/uL (ref 3.8–10.8)

## 2018-01-01 LAB — COMPREHENSIVE METABOLIC PANEL
AG RATIO: 1.6 (calc) (ref 1.0–2.5)
ALT: 27 U/L (ref 6–29)
AST: 19 U/L (ref 10–35)
Albumin: 4 g/dL (ref 3.6–5.1)
Alkaline phosphatase (APISO): 75 U/L (ref 33–130)
BUN: 12 mg/dL (ref 7–25)
CHLORIDE: 104 mmol/L (ref 98–110)
CO2: 27 mmol/L (ref 20–32)
CREATININE: 0.76 mg/dL (ref 0.50–1.05)
Calcium: 9.2 mg/dL (ref 8.6–10.4)
GLOBULIN: 2.5 g/dL (ref 1.9–3.7)
Glucose, Bld: 90 mg/dL (ref 65–99)
Potassium: 4 mmol/L (ref 3.5–5.3)
Sodium: 138 mmol/L (ref 135–146)
Total Bilirubin: 0.3 mg/dL (ref 0.2–1.2)
Total Protein: 6.5 g/dL (ref 6.1–8.1)

## 2018-01-02 DIAGNOSIS — B9789 Other viral agents as the cause of diseases classified elsewhere: Secondary | ICD-10-CM

## 2018-01-02 DIAGNOSIS — J069 Acute upper respiratory infection, unspecified: Secondary | ICD-10-CM | POA: Insufficient documentation

## 2018-01-02 DIAGNOSIS — D649 Anemia, unspecified: Secondary | ICD-10-CM | POA: Insufficient documentation

## 2018-01-02 NOTE — Assessment & Plan Note (Signed)
Well controlled on current regimen. Renal function stable, no changes today.  Lab Results  Component Value Date   CREATININE 0.76 12/31/2017   Lab Results  Component Value Date   NA 138 12/31/2017   K 4.0 12/31/2017   CL 104 12/31/2017   CO2 27 12/31/2017

## 2018-01-02 NOTE — Assessment & Plan Note (Signed)
She is tolerating a high potency statin without side effects.  LFTs are normal.   Lab Results  Component Value Date   CHOL 135 07/22/2017   HDL 45.60 07/22/2017   LDLCALC 70 07/22/2017   LDLDIRECT 94.0 09/25/2015   TRIG 95.0 07/22/2017   CHOLHDL 3 07/22/2017   Lab Results  Component Value Date   ALT 27 12/31/2017   AST 19 12/31/2017   ALKPHOS 82 07/22/2017   BILITOT 0.3 12/31/2017

## 2018-01-02 NOTE — Assessment & Plan Note (Signed)
Continue gabapentin, tramadol and flexeril.

## 2018-01-02 NOTE — Assessment & Plan Note (Signed)
Her symptoms do not suggest that she has a bacterial infection and her chest x ray today is clear.  Advisedher that s he  does not need to take an antibiotic at this time.  The complications of inappropriate antibiotic use were discussed with patientnet including risk of clostridium dificile colitis.  advised to cancontinue the allegra and tylenol, add  sudafed PE  Up to 30 mg every 6 hours as needed to manage the sinus congestion and substitute Afrin nasal spray for the evening dose to avoid insomnia. I'll call in a prednisone taper and a cough suppressant  .  And will add antibiotic only if patient develops fever,  Purulent sputum ,  Or facial/ear pain.

## 2018-01-02 NOTE — Assessment & Plan Note (Addendum)
Etiology unclear. She will return for additional testing

## 2018-01-09 ENCOUNTER — Other Ambulatory Visit: Payer: Self-pay | Admitting: Internal Medicine

## 2018-01-31 ENCOUNTER — Other Ambulatory Visit (INDEPENDENT_AMBULATORY_CARE_PROVIDER_SITE_OTHER): Payer: BC Managed Care – PPO

## 2018-01-31 DIAGNOSIS — D649 Anemia, unspecified: Secondary | ICD-10-CM

## 2018-01-31 NOTE — Addendum Note (Signed)
Addended by: Arby Barrette on: 01/31/2018 03:29 PM   Modules accepted: Orders

## 2018-01-31 NOTE — Addendum Note (Signed)
Addended by: Arby Barrette on: 01/31/2018 04:11 PM   Modules accepted: Orders

## 2018-02-08 ENCOUNTER — Other Ambulatory Visit (INDEPENDENT_AMBULATORY_CARE_PROVIDER_SITE_OTHER): Payer: BC Managed Care – PPO

## 2018-02-08 DIAGNOSIS — D649 Anemia, unspecified: Secondary | ICD-10-CM | POA: Diagnosis not present

## 2018-02-08 NOTE — Addendum Note (Signed)
Addended by: Leeanne Rio on: 02/08/2018 03:03 PM   Modules accepted: Orders

## 2018-02-09 LAB — FOLATE RBC: RBC FOLATE: 899 ng/mL (ref >280)

## 2018-02-09 LAB — IRON,TIBC AND FERRITIN PANEL
%SAT: 11 % (calc) (ref 11–50)
FERRITIN: 87 ng/mL (ref 10–232)
IRON: 37 ug/dL — AB (ref 45–160)
TIBC: 323 mcg/dL (calc) (ref 250–450)

## 2018-02-09 LAB — VITAMIN B12: Vitamin B-12: 786 pg/mL (ref 200–1100)

## 2018-02-15 ENCOUNTER — Telehealth: Payer: Self-pay | Admitting: Internal Medicine

## 2018-02-15 NOTE — Telephone Encounter (Signed)
Copied from [place]. Topic: General - Other >> Feb 15, 2018  4:08 PM Darl Householder, RMA wrote: Reason for CRM: patient is requesting a call back from DR. Tullo concerning knot under rt breast has returned, please return call

## 2018-02-16 MED ORDER — DOXYCYCLINE HYCLATE 100 MG PO TABS: 100.0000 mg | ORAL_TABLET | Freq: Two times a day (BID) | ORAL | 0 refills | Status: DC

## 2018-02-16 NOTE — Telephone Encounter (Signed)
Spoke with pt and she stated that sometime during the night the place came to a head and busted. She stated that it is currently draining. The drainage is white with blood in it. The pt is wanting to know if she needs an antibiotic or if she needs to be seen?

## 2018-02-16 NOTE — Telephone Encounter (Signed)
doxycycline called in  .  Make appt with RN tomorrow or Friday  To ensure it is improving

## 2018-02-16 NOTE — Telephone Encounter (Signed)
Pt gave a verbal understanding and stated that she would not be able to make in here tomorrow but she could come on Friday at 4pm. Will you please schedule pt for a nurse visit to have wound looked at on Friday at 4pm per Dr. Derrel Nip and I will take care of the visit. Pt is aware of appt date and time.

## 2018-02-16 NOTE — Telephone Encounter (Signed)
Scheduled and I will take care of this for you.

## 2018-02-17 NOTE — Telephone Encounter (Signed)
Thank you :)

## 2018-02-18 ENCOUNTER — Ambulatory Visit (INDEPENDENT_AMBULATORY_CARE_PROVIDER_SITE_OTHER): Payer: BC Managed Care – PPO | Admitting: *Deleted

## 2018-02-18 DIAGNOSIS — T8149XA Infection following a procedure, other surgical site, initial encounter: Secondary | ICD-10-CM | POA: Diagnosis not present

## 2018-02-18 DIAGNOSIS — IMO0002 Reserved for concepts with insufficient information to code with codable children: Secondary | ICD-10-CM

## 2018-02-22 NOTE — Progress Notes (Signed)
Patient in for wound check under right breast 1 cm with serous drainage no redness o to surrounding skin and cool to touch.  Patient advised to continue antibiotics and to use warm compresses and to keep clean and dry otherwise.

## 2018-03-03 ENCOUNTER — Ambulatory Visit: Payer: BC Managed Care – PPO | Admitting: Internal Medicine

## 2018-03-03 ENCOUNTER — Encounter: Payer: Self-pay | Admitting: Internal Medicine

## 2018-03-03 VITALS — BP 134/82 | HR 71 | Ht 64.0 in | Wt 185.4 lb

## 2018-03-03 DIAGNOSIS — E1169 Type 2 diabetes mellitus with other specified complication: Secondary | ICD-10-CM

## 2018-03-03 DIAGNOSIS — E1165 Type 2 diabetes mellitus with hyperglycemia: Secondary | ICD-10-CM

## 2018-03-03 DIAGNOSIS — E11319 Type 2 diabetes mellitus with unspecified diabetic retinopathy without macular edema: Secondary | ICD-10-CM

## 2018-03-03 DIAGNOSIS — Z794 Long term (current) use of insulin: Secondary | ICD-10-CM

## 2018-03-03 DIAGNOSIS — E669 Obesity, unspecified: Secondary | ICD-10-CM | POA: Diagnosis not present

## 2018-03-03 DIAGNOSIS — Z6832 Body mass index (BMI) 32.0-32.9, adult: Secondary | ICD-10-CM | POA: Diagnosis not present

## 2018-03-03 DIAGNOSIS — IMO0002 Reserved for concepts with insufficient information to code with codable children: Secondary | ICD-10-CM

## 2018-03-03 DIAGNOSIS — E785 Hyperlipidemia, unspecified: Secondary | ICD-10-CM

## 2018-03-03 LAB — POCT GLYCOSYLATED HEMOGLOBIN (HGB A1C): Hemoglobin A1C: 6.4

## 2018-03-03 NOTE — Patient Instructions (Addendum)
Please continue: Insulin Before breakfast Before lunch Before dinner  R 10 units with a smaller meal 15 units with a larger meal 10 units with a smaller meal 15 units with larger meal 20 units with a smaller meal 25 units with a regular meal  NPH 15 - 25    Continue Metformin 1000 mg 2x a day   Please come back for a follow-up appointment in 4 months with your log.

## 2018-03-03 NOTE — Progress Notes (Signed)
Patient ID: [name], female   DOB: [birth year], 57 y.o.   MRN: [id number]  HPI: [name] is a 57 y.o.-year-old female, returning for f/u for DM2, dx ~2000, insulin-dependent since 2010, uncontrolled, with complications (PN, + DR). Last visit 4 months ago.  She has an abscess under her R breast >> burst by itself >> used warm compresses >> was on ABx.   Last hemoglobin A1c was: Lab Results  Component Value Date   HGBA1C 6.5 11/01/2017   HGBA1C 6.2 07/23/2017   HGBA1C 6.8 04/09/2017   Pt is on: - Metformin 1000 mg 2x a day   Insulin Before breakfast Before lunch Before dinner  R 10 units with a smaller meal 15 units with a larger meal 10 units with a smaller meal 15 units with larger meal 20 units with a smaller meal 25 units with a regular meal  NPH 15 - 25    Pt is checking her sugars 1-3 times a day - am:  38, 45, 76-154, 189 >> 108-118, 201 >> 109-140, 164 - 2h after b'fast: 120-195 >> 154 >> n/c > 203 >> n/c - lunch:  47, 57, 60-167, 187 >> 130-140s >> 61-71 >> 90s >> 80-94 - mid-afternoon:  n/c >> 92-155 >> 45x1 , 90-115 >> n/c - before dinner: 46, 229 >> n/c >> ? >> 135 >> 108-114 - 2h after dinner:  n/c >> 216, 219, 451 >> n/c  - bedtime:   90-153, 202, 266 (fried potatoes) >> 100-150s >> 140-150, 200s - nighttime: 145-239, 338 >> n/c Lowest sugar was 45 >> 38 x1, 45 x1 >> 65; she has hypoglycemia awareness in the 80s. Highest sugar was  263 (banana pudding).  - No CKD, last BUN/creatinine:  Lab Results  Component Value Date   BUN 12 12/31/2017   CREATININE 0.76 12/31/2017   Reviewed ACR levels: Lab Results  Component Value Date   MICRALBCREAT 1.6 06/23/2017   MICRALBCREAT 0.7 06/19/2016   MICRALBCREAT 0.4 09/25/2015   MICRALBCREAT 0.3 07/30/2014   MICRALBCREAT 0.5 06/19/2013   MICRALBCREAT 1.5 01/31/2013   MICRALBCREAT 2.6 11/20/2011   Not on an ACE inhibitor/ARB  -+ HL; last set of lipids: Lab Results  Component Value Date   CHOL 135  07/22/2017   HDL 45.60 07/22/2017   LDLCALC 70 07/22/2017   LDLDIRECT 94.0 09/25/2015   TRIG 95.0 07/22/2017   CHOLHDL 3 07/22/2017  On Lipitor.  - last eye exam was in 03/2017: + DR L>R.  She has glaucoma.  Dr. Arnoldo Morale. - + numbness and tingling in her feet. Seeing a podiatrist.  She is taking 2 tablets of Neurontin a day.  She also has a history of hypertension, episodic alcohol abuse, GERD, low back pain, OA.   ROS: Constitutional: no weight gain/no weight loss, no fatigue, no subjective hyperthermia, no subjective hypothermia Eyes: no blurry vision, no xerophthalmia ENT: no sore throat, no nodules palpated in throat, no dysphagia, no odynophagia, no hoarseness Cardiovascular: no CP/no SOB/no palpitations/no leg swelling Respiratory: no cough/no SOB/no wheezing Gastrointestinal: no N/no V/no D/no C/no acid reflux Musculoskeletal: no muscle aches/no joint aches Skin: no rashes, no hair loss Neurological: no tremors/+ numbness/+ tingling/no dizziness  I reviewed pt's medications, allergies, PMH, social hx, family hx, and changes were documented in the history of present illness. Otherwise, unchanged from my initial visit note.  PE: BP 134/82   Pulse 71   Ht 5\' 4"  (1.626 m)   Wt 185 lb 6.4 oz (84.1 kg)  SpO2 97%   BMI 31.82 kg/m  Body mass index is 31.82 kg/m. Wt Readings from Last 3 Encounters:  03/03/18 185 lb 6.4 oz (84.1 kg)  12/31/17 183 lb (83 kg)  11/18/17 192 lb (87.1 kg)   Constitutional: overweight, in NAD Eyes: PERRLA, EOMI, no exophthalmos ENT: moist mucous membranes, no thyromegaly, no cervical lymphadenopathy Cardiovascular: RRR, No MRG Respiratory: CTA B Gastrointestinal: abdomen soft, NT, ND, BS+ Musculoskeletal: no deformities, strength intact in all 4 Skin: moist, warm, no rashes Neurological: no tremor with outstretched hands, DTR normal in all 4  ASSESSMENT: 1. DM2, insulin-dependent, uncontrolled, with complications - peripheral neuropathy -  DR   2. Obesity class 1 - BMI 32.79  BMI Classification:  < 18.5 underweight   18.5-24.9 normal weight   25.0-29.9 overweight   30.0-34.9 class I obesity   35.0-39.9 class II obesity   ? 40.0 class III obesity   3. HL  PLAN:  1. Patient with long-standing, previously uncontrolled diabetes, now doing a great job on the basal-bolus insulin regimen with NPH and R insulin.  She is taking her injections as prescribed and sugars are at goal throughout the day.  No low blood sugars anymore.  At last visit she had a low after being very active during the day and delaying a meal. - She forgot her log at this visit, but per her recall, sugars are still at goal mostly except few hyperglycemic spikes 2/2 dietary indiscretions.  - No changes are needed in her regimen right now - I advised her to: Patient Instructions   Please continue: Insulin Before breakfast Before lunch Before dinner  R 10 units with a smaller meal 15 units with a larger meal 10 units with a smaller meal 15 units with larger meal 20 units with a smaller meal 25 units with a regular meal  NPH 15 - 25    Continue Metformin 1000 mg 2x a day   Please come back for a follow-up appointment in 4 months with your log.  - today, HbA1c is 6.4% (slightly better) - continue checking sugars at different times of the day - check 3x a day, rotating checks - advised for yearly eye exams >> she is UTD - Return to clinic in 4 mo with sugar log    2. Obesity class 1 - She gained a little weight since last visit, not significant  3. HL -Reviewed latest lipid panel from 06/2017: LDL at goal, improved -Continues Lipitor without side effects.  Philemon Kingdom, MD PhD [facility] Endocrinology

## 2018-03-13 NOTE — Progress Notes (Signed)
[name] [address] [place], [place] [address] Phone: [phone] Subjective:      CC: Left knee pain  [id number]  [name] is a 57 y.o. female coming in with complaint of left knee pain.  Found to have moderate osteoarthritic changes as well as a chronic meniscal tear and a peri-meniscal cyst.  Feels like she is having increasing swelling again.  Worsening discomfort.  Starting to affect daily activities.  Patient does not feel like she needs an MRI and has declined this in the past.     Past Medical History:  Diagnosis Date  . Arthritis   . Campylobacter enteritis May 2012   [facility] admission  . Carpal tunnel syndrome, bilateral   . Diabetes mellitus   . Gastritis with bleeding due to alcohol 2010   [facility] admission  . GERD (gastroesophageal reflux disease)   . Hyperlipidemia   . Hypertension    no meds  . Wears glasses    Past Surgical History:  Procedure Laterality Date  . CARPAL TUNNEL RELEASE Left 05/14/2014   Procedure: LEFT ULNAR NEUROPLASTY AT ELBOW AND ENDOSCOPIC CARPAL TUNNEL RELEASE;  Surgeon: Jolyn Nap, MD;  Location: [facility];  Service: Orthopedics;  Laterality: Left;  . CYST EXCISION  1995   under arms  . endometrial biopsy  2004   benign  . FOOT OSTEOTOMY  2010   right-spurs  . TONSILLECTOMY    . ULNAR NERVE TRANSPOSITION Left 05/14/2014   Procedure: ULNAR NERVE DECOMPRESSION/TRANSPOSITION;  Surgeon: Jolyn Nap, MD;  Location: [facility];  Service: Orthopedics;  Laterality: Left;   Social History   Socioeconomic History  . Marital status: Single    Spouse name: Not on file  . Number of children: Not on file  . Years of education: Not on file  . Highest education level: Not on file  Occupational History  . Not on file  Social Needs  . Financial resource strain: Not on file  . Food insecurity:    Worry: Not on file    Inability: Not on file  . Transportation  needs:    Medical: Not on file    Non-medical: Not on file  Tobacco Use  . Smoking status: Former Smoker    Last attempt to quit: 05/09/2012    Years since quitting: 5.8  . Smokeless tobacco: Never Used  Substance and Sexual Activity  . Alcohol use: No    Comment: not for 7 yr  . Drug use: Yes    Types: Marijuana    Comment: sometimes  . Sexual activity: Yes    Comment: same partner x 10 years  Lifestyle  . Physical activity:    Days per week: Not on file    Minutes per session: Not on file  . Stress: Not on file  Relationships  . Social connections:    Talks on phone: Not on file    Gets together: Not on file    Attends religious service: Not on file    Active member of club or organization: Not on file    Attends meetings of clubs or organizations: Not on file    Relationship status: Not on file  Other Topics Concern  . 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   She lives at home with daughter and grandson.   Allergies  Allergen Reactions  . Oxycodone-Acetaminophen  REACTION: Unknown reaction  . Penicillins Rash   Family History  Problem Relation Age of Onset  . Hypertension Mother   . Stroke Father   . Diabetes Maternal Grandmother   . Hypertension Maternal Grandmother      Past medical history, social, surgical and family history all reviewed in electronic medical record.  No pertanent information unless stated regarding to the chief complaint.   Review of Systems:Review of systems updated and as accurate as of 03/13/18  No headache, visual changes, nausea, vomiting, diarrhea, constipation, dizziness, abdominal pain, skin rash, fevers, chills, night sweats, weight loss, swollen lymph nodes, body aches,  chest pain, shortness of breath, mood changes.  Positive muscle aches and joint swelling  Objective  There were no vitals taken for this visit. Systems examined below as of 03/13/18   General: No apparent  distress alert and oriented x3 mood and affect normal, dressed appropriately.  HEENT: Pupils equal, extraocular movements intact  Respiratory: Patient's speak in full sentences and does not appear short of breath  Cardiovascular: No lower extremity edema, non tender, no erythema  Skin: Warm dry intact with no signs of infection or rash on extremities or on axial skeleton.  Abdomen: Soft nontender  Neuro: Cranial nerves II through XII are intact, neurovascularly intact in all extremities with 2+ DTRs and 2+ pulses.  Lymph: No lymphadenopathy of posterior or anterior cervical chain or axillae bilaterally.  Gait mild antalgic.  MSK:  Non tender with full range of motion and good stability and symmetric strength and tone of shoulders, elbows, wrist, hip, and ankles bilaterally.  Knee: Left valgus deformity noted.  Abnormal thigh to calf ratio.  Tender to palpation over medial and PF joint line.  ROM full in flexion and extension and lower leg rotation. instability with valgus force.  painful patellar compression. Patellar glide with moderate crepitus.  Positive McMurray's Patellar and quadriceps tendons unremarkable. Hamstring and quadriceps strength is normal. Contralateral knee shows mild arthritic changes but no instability  After informed written and verbal consent, patient was seated on exam table. Left knee was prepped with alcohol swab and utilizing anterolateral approach, patient's left knee space was injected with 4:1  marcaine 0.5%: Kenalog 40mg /dL. Patient tolerated the procedure well without immediate complications.    Impression and Recommendations:     This case required medical decision making of moderate complexity.      Note: This dictation was prepared with Dragon dictation along with smaller phrase technology. Any transcriptional errors that result from this process are unintentional.

## 2018-03-14 ENCOUNTER — Ambulatory Visit: Payer: BC Managed Care – PPO | Admitting: Family Medicine

## 2018-03-14 ENCOUNTER — Encounter: Payer: Self-pay | Admitting: Family Medicine

## 2018-03-14 DIAGNOSIS — M23204 Derangement of unspecified medial meniscus due to old tear or injury, left knee: Secondary | ICD-10-CM

## 2018-03-14 MED ORDER — VITAMIN D (ERGOCALCIFEROL) 1.25 MG (50000 UNIT) PO CAPS: 50000.0000 [IU] | ORAL_CAPSULE | ORAL | 0 refills | Status: DC

## 2018-03-14 NOTE — Assessment & Plan Note (Signed)
Injected again today dfiscussed MRI which she declined.  Discussed possible need for it later on.  Patient will continue with conservative therapy at this moment.  Follow-up again in 4-6 weeks

## 2018-03-14 NOTE — Patient Instructions (Addendum)
Good to see you  [name] is your friend.  Stay active If worsening symptoms we have other injections Once weekly vitamin D for 12 weeks Turmeric 500mg  daily  Tart cherry extract any dose at night Make an appointment in 4-6 weeks but if doing well then come back when you need me

## 2018-03-24 ENCOUNTER — Telehealth: Payer: Self-pay | Admitting: Internal Medicine

## 2018-03-24 NOTE — Telephone Encounter (Signed)
Copied from [place]. Topic: Inquiry >> Mar 24, 2018 12:48 PM Seriah Brotzman, [place], [place] wrote: Reason for CRM: Jori Moll called from [facility] to let someone know that he will be faxing over a refill request for the patient for Diclofenac Sodium and Triamzinolone Cream. If anyone has any further questions he can be reached at [phone]

## 2018-03-25 ENCOUNTER — Ambulatory Visit: Payer: Self-pay | Admitting: Internal Medicine

## 2018-03-28 ENCOUNTER — Ambulatory Visit: Payer: BC Managed Care – PPO | Admitting: Internal Medicine

## 2018-03-28 ENCOUNTER — Encounter: Payer: Self-pay | Admitting: Internal Medicine

## 2018-03-28 DIAGNOSIS — M1990 Unspecified osteoarthritis, unspecified site: Secondary | ICD-10-CM | POA: Diagnosis not present

## 2018-03-28 DIAGNOSIS — G5603 Carpal tunnel syndrome, bilateral upper limbs: Secondary | ICD-10-CM | POA: Diagnosis not present

## 2018-03-28 DIAGNOSIS — N611 Abscess of the breast and nipple: Secondary | ICD-10-CM

## 2018-03-28 DIAGNOSIS — I10 Essential (primary) hypertension: Secondary | ICD-10-CM | POA: Diagnosis not present

## 2018-03-28 MED ORDER — TRAMADOL HCL 50 MG PO TABS: 50.0000 mg | ORAL_TABLET | Freq: Three times a day (TID) | ORAL | 0 refills | Status: DC | PRN

## 2018-03-28 MED ORDER — NYSTATIN 100000 UNIT/GM EX POWD: CUTANEOUS | 3 refills | Status: DC

## 2018-03-28 MED ORDER — LIDOCAINE 5 % EX PTCH: 1.0000 | MEDICATED_PATCH | CUTANEOUS | 5 refills | Status: DC

## 2018-03-28 MED ORDER — DOXYCYCLINE HYCLATE 100 MG PO TABS: 100.0000 mg | ORAL_TABLET | Freq: Two times a day (BID) | ORAL | 0 refills | Status: DC

## 2018-03-28 NOTE — Patient Instructions (Addendum)
Doxycycline 100 mg twice daily for the boil under your right breast  Keep a paper towel or absorbent cloth under breast to keep area dry   Referral to orthopedist for wrist splints to treat your carpal tunnel syndrome   Postpone mammogram for 2 weeks to allow skin to heal  Tramadol referred for occasional use.  Use tylenol in between   STOP EATING LIKE YOU DON'T HAVE DIABETES!  YOU NEED TO LOSE 20 LBS

## 2018-03-28 NOTE — Progress Notes (Signed)
Subjective:  Patient ID: [name], female    DOB: 1961-08-05  Age: 57 y.o. MRN: [id number]  CC: Diagnoses of Arthritis, Boil, breast, Essential hypertension, and Carpal tunnel syndrome on both sides were pertinent to this visit.  HPI [name] presents for  Evaluation of multiple issues  1)  Recurrent draining boils under breasts boil under right breast begam draining 24 hours ago ,  serosangiuinous fluid.   3) Both arms start  tingling from  wrist to shoulder .  Starting to have persistent neck pain laeral,  Neck feels heavy at night, and in the morning   Legs hurting , ankles hurting, feet,   Muscles cramping  Refill on tramadol requested  Last mammogram March   CTS right wrist by EG 2018.  Needs to see rtho for wrist splints/    Lab Results  Component Value Date   HGBA1C 6.4% 03/03/2018    Needs lidoderm patches PA needed   Outpatient Medications Prior to Visit  Medication Sig Dispense Refill  . Continuous Blood Gluc Sensor (FREESTYLE LIBRE 14 DAY SENSOR) MISC 1 each by Does not apply route every 14 (fourteen) days. Change every 2 weeks 2 each 11  . cyclobenzaprine (FLEXERIL) 10 MG tablet Take 1 tablet (10 mg total) by mouth at bedtime. 30 tablet 5  . Diclofenac Sodium 2 % SOLN Apply 1 pump twice daily. 112 g 3  . gabapentin (NEURONTIN) 300 MG capsule Take 1 capsule (300 mg total) by mouth 2 (two) times daily. 180 capsule 1  . glucose blood (BAYER CONTOUR TEST) test strip TEST BLOOD SUGARS 3 TIMES DAILY 200 each 11  . insulin NPH Human (NOVOLIN N RELION) 100 UNIT/ML injection Inject 15 units under skin in the AM and 25 units in the evening 30 mL 5  . insulin regular (NOVOLIN R RELION) 100 units/mL injection INJECT UP TO 50 UNITS SUBCUTANEOUSLY ONCE DAILY AS DIRECTED 20 mL 5  . Lancet [employer] (EASY TOUCH LANCING DEVICE) MISC Use to check diabetes 4 times daily  E 11.65 1 each 11  . metFORMIN (GLUCOPHAGE) 1000 MG tablet TAKE ONE TABLET BY MOUTH TWICE DAILY WITH  MEALS 60 tablet 5  . omeprazole (PRILOSEC) 40 MG capsule Take 1 capsule (40 mg total) by mouth daily. 30 capsule 3  . nystatin (NYSTATIN) powder Apply powder to affected area twice daily until rash has resolved 15 g 3  . atorvastatin (LIPITOR) 40 MG tablet TAKE ONE TABLET BY MOUTH ONCE DAILY (Patient not taking: Reported on 03/28/2018) 30 tablet 5  . DULoxetine (CYMBALTA) 30 MG capsule Take one 30 mg tablet by mouth once a day for the first week. Then increase to two 30 mg tablets ( total 60mg ) by mouth once daily. (Patient not taking: Reported on 03/28/2018) 60 capsule 3  . doxycycline (VIBRA-TABS) 100 MG tablet Take 1 tablet (100 mg total) by mouth 2 (two) times daily. (Patient not taking: Reported on 03/28/2018) 20 tablet 0  . guaiFENesin-codeine (CHERATUSSIN AC) 100-10 MG/5ML syrup Take 5 mLs by mouth 3 (three) times daily as needed for cough. (Patient not taking: Reported on 03/28/2018) 120 mL 0  . lidocaine (LIDODERM) 5 % Place 1 patch onto the skin daily. Reported on 06/19/2016 (Patient not taking: Reported on 03/28/2018) 30 patch 1  . predniSONE (DELTASONE) 10 MG tablet 6 tablets on Day 1 , then reduce by 1 tablet daily until gone (Patient not taking: Reported on 03/28/2018) 21 tablet 0  . traMADol (ULTRAM) 50 MG tablet  Take 1 tablet (50 mg total) by mouth every 8 (eight) hours as needed. (Patient not taking: Reported on 03/28/2018) 30 tablet 0  . Vitamin D, Ergocalciferol, (DRISDOL) 50000 units CAPS capsule Take 1 capsule (50,000 Units total) by mouth every 7 (seven) days. (Patient not taking: Reported on 03/28/2018) 12 capsule 0   No facility-administered medications prior to visit.     Review of Systems;  Patient denies headache, fevers, malaise, unintentional weight loss, skin rash, eye pain, sinus congestion and sinus pain, sore throat, dysphagia,  hemoptysis , cough, dyspnea, wheezing, chest pain, palpitations, orthopnea, edema, abdominal pain, nausea, melena, diarrhea, constipation, flank  pain, dysuria, hematuria, urinary  Frequency, nocturia, numbness, tingling, seizures,  Focal weakness, Loss of consciousness,  Tremor, insomnia, depression, anxiety, and suicidal ideation.      Objective:  BP 122/80 (BP Location: Left Arm, Patient Position: Sitting, Cuff Size: Normal)   Pulse 86   Temp 97.9 F (36.6 C) (Oral)   Resp 15   Ht 5\' 4"  (1.626 m)   Wt 187 lb 6.4 oz (85 kg)   SpO2 98%   BMI 32.17 kg/m   BP Readings from Last 3 Encounters:  03/28/18 122/80  03/14/18 140/84  03/03/18 134/82    Wt Readings from Last 3 Encounters:  03/28/18 187 lb 6.4 oz (85 kg)  03/14/18 187 lb (84.8 kg)  03/03/18 185 lb 6.4 oz (84.1 kg)    General appearance: alert, cooperative and appears stated age Ears: normal TM's and external ear canals both ears Throat: lips, mucosa, and tongue normal; teeth and gums normal Neck: no adenopathy, no carotid bruit, supple, symmetrical, trachea midline and thyroid not enlarged, symmetric, no tenderness/mass/nodules Back: symmetric, no curvature. ROM normal. No CVA tenderness. Lungs: clear to auscultation bilaterally Heart: regular rate and rhythm, S1, S2 normal, no murmur, click, rub or gallop Abdomen: soft, non-tender; bowel sounds normal; no masses,  no organomegaly Pulses: 2+ and symmetric Skin: Skin color, texture, turgor normal. No rashes or lesions Lymph nodes: Cervical, supraclavicular, and axillary nodes normal.  Lab Results  Component Value Date   HGBA1C 6.4% 03/03/2018   HGBA1C 6.5 11/01/2017   HGBA1C 6.2 07/23/2017    Lab Results  Component Value Date   CREATININE 0.76 12/31/2017   CREATININE 0.64 07/22/2017   CREATININE 0.49 12/18/2016    Lab Results  Component Value Date   WBC 10.4 12/31/2017   HGB 11.4 (L) 12/31/2017   HCT 34.1 (L) 12/31/2017   PLT 459 (H) 12/31/2017   GLUCOSE 90 12/31/2017   CHOL 135 07/22/2017   TRIG 95.0 07/22/2017   HDL 45.60 07/22/2017   LDLDIRECT 94.0 09/25/2015   LDLCALC 70 07/22/2017    ALT 27 12/31/2017   AST 19 12/31/2017   NA 138 12/31/2017   K 4.0 12/31/2017   CL 104 12/31/2017   CREATININE 0.76 12/31/2017   BUN 12 12/31/2017   CO2 27 12/31/2017   TSH 1.75 11/09/2016   HGBA1C 6.4% 03/03/2018   MICROALBUR <0.7 06/23/2017    No results found.  Assessment & Plan:   Problem List Items Addressed This Visit    Arthritis   Relevant Medications   traMADol (ULTRAM) 50 MG tablet   lidocaine (LIDODERM) 5 %   Essential hypertension    Well controlled on current regimen. Renal function stable, no changes today.  Lab Results  Component Value Date   CREATININE 0.76 12/31/2017   Lab Results  Component Value Date   NA 138 12/31/2017   K 4.0 12/31/2017  CL 104 12/31/2017   CO2 27 12/31/2017         Carpal tunnel syndrome on both sides    Noted on EMG studies last year  Left wirst. Referral to Orthopedics for wrist splints vs surgical decompression      Boil, breast    Doxycycline prescribed.  Advised to keep boil covered with gauze.      Relevant Medications   nystatin (NYSTATIN) powder      I have discontinued Maryon C. Greaves's predniSONE, guaiFENesin-codeine, and Vitamin D (Ergocalciferol). I am also having her maintain her EASY TOUCH LANCING DEVICE, glucose blood, omeprazole, cyclobenzaprine, atorvastatin, gabapentin, DULoxetine, insulin NPH Human, insulin regular, FREESTYLE LIBRE 14 DAY SENSOR, Diclofenac Sodium, metFORMIN, traMADol, doxycycline, lidocaine, and nystatin.  Meds ordered this encounter  Medications  . traMADol (ULTRAM) 50 MG tablet    Sig: Take 1 tablet (50 mg total) by mouth every 8 (eight) hours as needed.    Dispense:  30 tablet    Refill:  0  . doxycycline (VIBRA-TABS) 100 MG tablet    Sig: Take 1 tablet (100 mg total) by mouth 2 (two) times daily.    Dispense:  20 tablet    Refill:  0  . lidocaine (LIDODERM) 5 %    Sig: Place 1 patch onto the skin daily. Reported on 06/19/2016    Dispense:  30 patch    Refill:  5  .  nystatin (NYSTATIN) powder    Sig: Apply powder to affected area twice daily until rash has resolved    Dispense:  15 g    Refill:  3    Medications Discontinued During This Encounter  Medication Reason  . doxycycline (VIBRA-TABS) 100 MG tablet Completed Course  . guaiFENesin-codeine (CHERATUSSIN AC) 100-10 MG/5ML syrup Completed Course  . predniSONE (DELTASONE) 10 MG tablet Completed Course  . Vitamin D, Ergocalciferol, (DRISDOL) 50000 units CAPS capsule Completed Course  . traMADol (ULTRAM) 50 MG tablet Reorder  . lidocaine (LIDODERM) 5 % Reorder  . nystatin (NYSTATIN) powder Reorder    Follow-up: No follow-ups on file.   Crecencio Mc, MD

## 2018-03-29 DIAGNOSIS — G5603 Carpal tunnel syndrome, bilateral upper limbs: Secondary | ICD-10-CM | POA: Insufficient documentation

## 2018-03-29 NOTE — Assessment & Plan Note (Signed)
Well controlled on current regimen. Renal function stable, no changes today.  Lab Results  Component Value Date   CREATININE 0.76 12/31/2017   Lab Results  Component Value Date   NA 138 12/31/2017   K 4.0 12/31/2017   CL 104 12/31/2017   CO2 27 12/31/2017

## 2018-03-29 NOTE — Assessment & Plan Note (Signed)
Doxycycline prescribed.  Advised to keep boil covered with gauze.

## 2018-03-29 NOTE — Assessment & Plan Note (Addendum)
Right sided CTS Noted on EMG studies last year  Left wrist also currently affected . Referral to Orthopedics for wrist splints vs surgical decompression

## 2018-04-05 ENCOUNTER — Telehealth: Payer: Self-pay | Admitting: Internal Medicine

## 2018-04-05 DIAGNOSIS — G5601 Carpal tunnel syndrome, right upper limb: Secondary | ICD-10-CM

## 2018-04-05 NOTE — Telephone Encounter (Signed)
Please advise 

## 2018-04-05 NOTE — Telephone Encounter (Signed)
Pt would like to get a referral to an orthopedist in [place] since she now lives in [place].

## 2018-04-05 NOTE — Telephone Encounter (Signed)
Copied from [place]. Topic: Quick Communication - See Telephone Encounter >> Apr 05, 2018  2:38 PM Boyd Kerbs wrote: CRM for notification. See Telephone encounter for: 04/05/18.  Ebony Hail from [facility] ext [phone] called pt to schedule and she is asking for Ortho referral for doctor in [place].  She lives in [place] now.   Please call pt. And let her know

## 2018-04-06 NOTE — Telephone Encounter (Signed)
Referral changed

## 2018-04-06 NOTE — Telephone Encounter (Signed)
[name] at [facility] aware that she can cancel the referral with them.

## 2018-04-07 ENCOUNTER — Telehealth: Payer: Self-pay

## 2018-04-07 DIAGNOSIS — M545 Low back pain: Secondary | ICD-10-CM

## 2018-04-07 NOTE — Telephone Encounter (Signed)
PA for Tramadol was submitted on 04/07/2018 and approved.

## 2018-04-11 ENCOUNTER — Telehealth: Payer: Self-pay | Admitting: Internal Medicine

## 2018-04-11 NOTE — Telephone Encounter (Signed)
Copied from [place] [id number]. Topic: Quick Communication - See Telephone Encounter >> Apr 11, 2018 11:08 AM Cleaster Corin, [place] wrote: CRM for notification. See Telephone encounter for: 04/11/18.   Libby from [facility] is needing a diagnostic code for med. traMADol (ULTRAM) 50 MG tablet [[id number]]   [place], [place] [address] Phone: [phone] Fax: [phone]

## 2018-04-11 NOTE — Telephone Encounter (Signed)
Returned call to [place] at [facility] in [place] ( [place]) and gave her diagnosis code for tramadol.

## 2018-04-12 ENCOUNTER — Telehealth: Payer: Self-pay

## 2018-04-12 NOTE — Telephone Encounter (Signed)
PA for lidocaine patches has been submitted on covermymeds.

## 2018-04-13 ENCOUNTER — Encounter (INDEPENDENT_AMBULATORY_CARE_PROVIDER_SITE_OTHER): Payer: Self-pay | Admitting: Orthopaedic Surgery

## 2018-04-13 ENCOUNTER — Ambulatory Visit (INDEPENDENT_AMBULATORY_CARE_PROVIDER_SITE_OTHER): Payer: BC Managed Care – PPO | Admitting: Orthopaedic Surgery

## 2018-04-13 DIAGNOSIS — G5601 Carpal tunnel syndrome, right upper limb: Secondary | ICD-10-CM

## 2018-04-13 NOTE — Progress Notes (Signed)
The patient is someone I seen before.  She comes in with bilateral hand pain and numbness with the right worse than left.  She says it is painful to grip anything and she does feel like her hands are weak.  She is a diabetic and reports a hemoglobin A1c of just over 6 but she said recently her blood glucose is not been running good.  When I reviewed her notes I found out that she actually has had a left cubital tunnel release and an endoscopic carpal tunnel release by Dr. Grandville Silos with [facility].  She is never had surgery on the right side.  I further reviewed her chart and found out that she did have nerve conduction studies in February 2018.  Her original surgery on her left side was in 2015.  The EMG studies were reviewed and showed almost complete resolution of her previous ulnar and median nerve neuropathy on the left side.  It did show severe right carpal tunnel syndrome.  On exam she has weak grip strength bilaterally but some of this is lack of effort.  There is no muscle atrophy on her hands bilaterally and her motor function appears to be intact grossly.  She is got intact pulses in her wrists bilaterally as well near equal.  At this point she would like to consider carpal tunnel surgery on the right side.  I did tell her that I do not perform this endoscopically like Dr. Grandville Silos and she is interested in seeing him again then to consider right carpal tunnel surgery.  I talked her in detail in length about this and I agree with her seeing him again since she has success and surgery with him before on her left side.

## 2018-04-21 ENCOUNTER — Other Ambulatory Visit: Payer: Self-pay | Admitting: Internal Medicine

## 2018-04-21 NOTE — Telephone Encounter (Signed)
Copied from [place] [id number]. Topic: Quick Communication - See Telephone Encounter >> Apr 21, 2018 12:43 PM Ivar Drape wrote: CRM for notification. See Telephone encounter for: 04/21/18. [facility] w/[facility] [phone] called for a refill for cyclobenzaprine (FLEXERIL) 10 MG tablet.

## 2018-04-21 NOTE — Telephone Encounter (Signed)
LOV  03/28/18 Dr. Derrel Nip Last refill  06/23/17  #30 with 5 refills

## 2018-04-24 NOTE — Progress Notes (Signed)
[name] [address], [place] [address] Phone: [phone] Subjective:     CC: Left knee follow-up  XBD:[id number]  [name] is a 57 y.o. female coming in with complaint of left knee pain.  Patient does have what appears to be more of a chronic meniscal tear.  Was given an injection for 6 weeks ago.  Had some mild improvement but still having the pain.  X-rays have shown mild to moderate arthritic changes previously letter 57 years old.  He still has pain mostly on a daily basis and describes as an aching sensation.  Denies any fevers chills or any abnormal weight loss.     Past Medical History:  Diagnosis Date  . Arthritis   . Campylobacter enteritis May 2012   [facility] admission  . Carpal tunnel syndrome, bilateral   . Diabetes mellitus   . Gastritis with bleeding due to alcohol 2010   [facility] admission  . GERD (gastroesophageal reflux disease)   . Hyperlipidemia   . Hypertension    no meds  . Wears glasses    Past Surgical History:  Procedure Laterality Date  . CARPAL TUNNEL RELEASE Left 05/14/2014   Procedure: LEFT ULNAR NEUROPLASTY AT ELBOW AND ENDOSCOPIC CARPAL TUNNEL RELEASE;  Surgeon: Jolyn Nap, MD;  Location: [facility];  Service: Orthopedics;  Laterality: Left;  . CYST EXCISION  1995   under arms  . endometrial biopsy  2004   benign  . FOOT OSTEOTOMY  2010   right-spurs  . TONSILLECTOMY    . ULNAR NERVE TRANSPOSITION Left 05/14/2014   Procedure: ULNAR NERVE DECOMPRESSION/TRANSPOSITION;  Surgeon: Jolyn Nap, MD;  Location: [facility];  Service: Orthopedics;  Laterality: Left;   Social History   Socioeconomic History  . Marital status: Single    Spouse name: Not on file  . Number of children: Not on file  . Years of education: Not on file  . Highest education level: Not on file  Occupational History  . Not on file  Social Needs  . Financial resource strain: Not on file  .  Food insecurity:    Worry: Not on file    Inability: Not on file  . Transportation needs:    Medical: Not on file    Non-medical: Not on file  Tobacco Use  . Smoking status: Former Smoker    Last attempt to quit: 05/09/2012    Years since quitting: 5.9  . Smokeless tobacco: Never Used  Substance and Sexual Activity  . Alcohol use: No    Comment: not for 7 yr  . Drug use: Yes    Types: Marijuana    Comment: sometimes  . Sexual activity: Yes    Comment: same partner x 10 years  Lifestyle  . Physical activity:    Days per week: Not on file    Minutes per session: Not on file  . Stress: Not on file  Relationships  . Social connections:    Talks on phone: Not on file    Gets together: Not on file    Attends religious service: Not on file    Active member of club or organization: Not on file    Attends meetings of clubs or organizations: Not on file    Relationship status: Not on file  Other Topics Concern  . 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   She  lives at home with daughter and grandson.   Allergies  Allergen Reactions  . Oxycodone-Acetaminophen     REACTION: Unknown reaction  . Penicillins Rash   Family History  Problem Relation Age of Onset  . Hypertension Mother   . Stroke Father   . Diabetes Maternal Grandmother   . Hypertension Maternal Grandmother      Past medical history, social, surgical and family history all reviewed in electronic medical record.  No pertanent information unless stated regarding to the chief complaint.   Review of Systems:Review of systems updated and as accurate as of 04/26/18  No headache, visual changes, nausea, vomiting, diarrhea, constipation, dizziness, abdominal pain, skin rash, fevers, chills, night sweats, weight loss, swollen lymph nodes, body aches, joint swelling, , chest pain, shortness of breath, mood changes.  Positive muscle aches  Objective  Blood pressure (!) 142/80,  pulse 89, height 5\' 4"  (1.626 m), weight 189 lb (85.7 kg), SpO2 98 %. Systems examined below as of 04/26/18   General: No apparent distress alert and oriented x3 mood and affect normal, dressed appropriately.  HEENT: Pupils equal, extraocular movements intact  Respiratory: Patient's speak in full sentences and does not appear short of breath  Cardiovascular: No lower extremity edema, non tender, no erythema  Skin: Warm dry intact with no signs of infection or rash on extremities or on axial skeleton.  Abdomen: Soft nontender  Neuro: Cranial nerves II through XII are intact, neurovascularly intact in all extremities with 2+ DTRs and 2+ pulses.  Lymph: No lymphadenopathy of posterior or anterior cervical chain or axillae bilaterally.  Gait normal with good balance and coordination.  MSK:  Non tender with full range of motion and good stability and symmetric strength and tone of shoulders, elbows, wrist, hip, and ankles bilaterally.  Knee: Left Normal to inspection with no erythema or effusion or obvious bony abnormalities. Tender to palpation of the medial joint line. ROM full in flexion and extension and lower leg rotation. Ligaments with solid consistent endpoints including ACL, PCL, LCL, MCL. Positive Mcmurray's, Apley's, and Thessalonian tests. Mild painful patellar compression. Patellar glide mild to moderate crepitus. Patellar and quadriceps tendons unremarkable. Hamstring and quadriceps strength is normal. Contralateral knee unremarkable     Impression and Recommendations:     This case required medical decision making of moderate complexity.      Note: This dictation was prepared with Dragon dictation along with smaller phrase technology. Any transcriptional errors that result from this process are unintentional.

## 2018-04-25 MED ORDER — CYCLOBENZAPRINE HCL 10 MG PO TABS: 10.0000 mg | ORAL_TABLET | Freq: Every day | ORAL | 5 refills | Status: DC

## 2018-04-26 ENCOUNTER — Encounter: Payer: Self-pay | Admitting: Family Medicine

## 2018-04-26 ENCOUNTER — Ambulatory Visit (INDEPENDENT_AMBULATORY_CARE_PROVIDER_SITE_OTHER)
Admission: RE | Admit: 2018-04-26 | Discharge: 2018-04-26 | Disposition: A | Discharge status: Home / Self Care | Payer: BC Managed Care – PPO | Source: Ambulatory Visit | Attending: Family Medicine | Admitting: Family Medicine

## 2018-04-26 ENCOUNTER — Ambulatory Visit: Payer: BC Managed Care – PPO | Admitting: Family Medicine

## 2018-04-26 VITALS — BP 142/80 | HR 89 | Ht 64.0 in | Wt 189.0 lb

## 2018-04-26 DIAGNOSIS — M1712 Unilateral primary osteoarthritis, left knee: Secondary | ICD-10-CM | POA: Diagnosis not present

## 2018-04-26 DIAGNOSIS — G8929 Other chronic pain: Secondary | ICD-10-CM

## 2018-04-26 DIAGNOSIS — M25562 Pain in left knee: Secondary | ICD-10-CM | POA: Diagnosis not present

## 2018-04-26 NOTE — Assessment & Plan Note (Signed)
Degenerative arthritis.  Discussed icing regimen, home exercise, which activities of doing which wants to avoid.  Patient is to increase activity as tolerated.  Follow-up with me again in 4 weeks patient has failed all other conservative therapy and will consider Visco supplementation.  I have discussed the possibility of an MRI for further evaluation of the arthritis as well as some meniscal tear but patient is unable to afford it.

## 2018-04-26 NOTE — Patient Instructions (Signed)
God to see you  We will get a referral for synvisc the other injections Xrays downstairs today  For the hands try the cream daily until gone.  Ice for th e knee.  Make an appointment in 3-4 weeks and can repeat steroid injection or we will call you sooner for the other injections

## 2018-06-14 ENCOUNTER — Telehealth: Payer: Self-pay | Admitting: *Deleted

## 2018-06-14 MED ORDER — DOXYCYCLINE HYCLATE 100 MG PO TABS: 100.0000 mg | ORAL_TABLET | Freq: Two times a day (BID) | ORAL | 0 refills | Status: DC

## 2018-06-14 NOTE — Telephone Encounter (Signed)
Patient notified and voiced understanding.

## 2018-06-14 NOTE — Telephone Encounter (Signed)
Patient both breast haveboils under them like she was treated for with doxycycline in April the right breast is draining and drying up but the left breast has a boil now, patient asking if ABX can be called in offered appointment, patient ask if she could just get antibiotic?

## 2018-06-14 NOTE — Telephone Encounter (Signed)
Copied from [place] [id number]. Topic: Quick Communication - See Telephone Encounter >> Jun 14, 2018 10:17 AM Antonieta Iba C wrote: CRM for notification. See Telephone encounter for: 06/14/18.   Pt is requesting another antibiotic for your breast sent into pharmacy  Pharmacy: [facility], [place] [phone] (Phone) [phone] (Fax)

## 2018-06-14 NOTE — Telephone Encounter (Signed)
Does not need to be seen , ,recurrent probelme well known to me.  r for doxy sent  To [facility] in chart.  Daily use of a probiotic advised for 3 weeks.

## 2018-06-27 ENCOUNTER — Ambulatory Visit (INDEPENDENT_AMBULATORY_CARE_PROVIDER_SITE_OTHER): Payer: BC Managed Care – PPO | Admitting: Internal Medicine

## 2018-06-27 ENCOUNTER — Encounter: Payer: Self-pay | Admitting: Internal Medicine

## 2018-06-27 VITALS — BP 144/84 | HR 88 | Temp 98.2°F | Resp 16 | Ht 64.0 in | Wt 190.2 lb

## 2018-06-27 DIAGNOSIS — R52 Pain, unspecified: Secondary | ICD-10-CM

## 2018-06-27 DIAGNOSIS — IMO0002 Reserved for concepts with insufficient information to code with codable children: Secondary | ICD-10-CM

## 2018-06-27 DIAGNOSIS — E11319 Type 2 diabetes mellitus with unspecified diabetic retinopathy without macular edema: Secondary | ICD-10-CM | POA: Diagnosis not present

## 2018-06-27 DIAGNOSIS — R32 Unspecified urinary incontinence: Secondary | ICD-10-CM

## 2018-06-27 DIAGNOSIS — E785 Hyperlipidemia, unspecified: Secondary | ICD-10-CM

## 2018-06-27 DIAGNOSIS — L732 Hidradenitis suppurativa: Secondary | ICD-10-CM

## 2018-06-27 DIAGNOSIS — G5601 Carpal tunnel syndrome, right upper limb: Secondary | ICD-10-CM

## 2018-06-27 DIAGNOSIS — N3941 Urge incontinence: Secondary | ICD-10-CM

## 2018-06-27 DIAGNOSIS — G8929 Other chronic pain: Secondary | ICD-10-CM

## 2018-06-27 DIAGNOSIS — Z794 Long term (current) use of insulin: Secondary | ICD-10-CM | POA: Diagnosis not present

## 2018-06-27 DIAGNOSIS — M25512 Pain in left shoulder: Secondary | ICD-10-CM

## 2018-06-27 DIAGNOSIS — E1165 Type 2 diabetes mellitus with hyperglycemia: Secondary | ICD-10-CM | POA: Diagnosis not present

## 2018-06-27 DIAGNOSIS — E1169 Type 2 diabetes mellitus with other specified complication: Secondary | ICD-10-CM

## 2018-06-27 LAB — POCT URINALYSIS DIPSTICK
BILIRUBIN UA: NEGATIVE
GLUCOSE UA: NEGATIVE
KETONES UA: NEGATIVE
Leukocytes, UA: NEGATIVE
Nitrite, UA: NEGATIVE
Protein, UA: NEGATIVE
Urobilinogen, UA: 0.2 E.U./dL
pH, UA: 5.5 (ref 5.0–8.0)

## 2018-06-27 MED ORDER — SULFAMETHOXAZOLE-TRIMETHOPRIM 800-160 MG PO TABS: 1.0000 | ORAL_TABLET | Freq: Two times a day (BID) | ORAL | 0 refills | Status: DC

## 2018-06-27 MED ORDER — TRAMADOL HCL 50 MG PO TABS: 50.0000 mg | ORAL_TABLET | Freq: Three times a day (TID) | ORAL | 0 refills | Status: DC | PRN

## 2018-06-27 MED ORDER — TRAMADOL HCL 50 MG PO TABS: 50.0000 mg | ORAL_TABLET | Freq: Every day | ORAL | 5 refills | Status: DC | PRN

## 2018-06-27 NOTE — Progress Notes (Signed)
Subjective:  Patient ID: [name], female    DOB: 12-29-1960  Age: 57 y.o. MRN: [id number]  CC: The primary encounter diagnosis was Urinary incontinence, unspecified type. Diagnoses of Uncontrolled type 2 diabetes mellitus with retinopathy, with long-term current use of insulin ([place]), Carpal tunnel syndrome on right, Chronic left shoulder pain, HIDRADENITIS SUPPURATIVA, Hyperlipidemia associated with type 2 diabetes mellitus ([place]), Chronic pain of multiple sites, and Urgency incontinence were also pertinent to this visit.  HPI [name] presents for follow up on multiple issues including type 2 DM,  Hyperlipidemia, hypertension and recurrent skin  infectiions due to hidradenitis suppurativa   HS:  She was recently prescribed doxycycline for  A recurrence of  boils/abscess under  Both breasts but has missed several doses of treatment and continues to have purulent drainage from both .  her pain ad swelling have improved and she denies fevers.   She states that she is using Dial soap daily and Hibiclens  weekly. Marland Kitchen  Her  Previous treatment in April  Was successful  2( Multiple pain complaints today,, all chronic:   Bilateral carpal tunnel syndrome .  Right worse than left by EMG/[place]hen[place] studies. Was referred to Orthopedics but no treatment was given and she was advised to see her previous Psychologist, sport and exercise .   Left shoulder pain getting worse,  Had shoulder surgery 5 years ago by Dr. Marcello Moores   to repair a rotator  cuff tear. .      Left knee pain with chronic meniscal tear .  Has seen Sports medicine.  She has declined MRI   She has requested and received narcotics in the distant past but I have declined long term refills and prescribed cymbalta, gabapentin and prn  tramadol.  She is Using tramadol 1/2 tablet  (too drowsy with a whole tablet).  She has a history of peptic ulcer disease secondary to alcohol and NSAIDs.   3) Type 2 DM: previously uncontrolled now under excellent control by Dr Renne Crigler /  Endicrinology  With multiple doses of insulin daily.   Outpatient Medications Prior to Visit  Medication Sig Dispense Refill  . atorvastatin (LIPITOR) 40 MG tablet TAKE ONE TABLET BY MOUTH ONCE DAILY 30 tablet 5  . Continuous Blood Gluc Sensor (FREESTYLE LIBRE 14 DAY SENSOR) MISC 1 each by Does not apply route every 14 (fourteen) days. Change every 2 weeks 2 each 11  . DULoxetine (CYMBALTA) 30 MG capsule Take one 30 mg tablet by mouth once a day for the first week. Then increase to two 30 mg tablets ( total 60mg ) by mouth once daily. 60 capsule 3  . gabapentin (NEURONTIN) 300 MG capsule Take 1 capsule (300 mg total) by mouth 2 (two) times daily. 180 capsule 1  . glucose blood (BAYER CONTOUR TEST) test strip TEST BLOOD SUGARS 3 TIMES DAILY 200 each 11  . insulin NPH Human (NOVOLIN N RELION) 100 UNIT/ML injection Inject 15 units under skin in the AM and 25 units in the evening 30 mL 5  . insulin regular (NOVOLIN R RELION) 100 units/mL injection INJECT UP TO 50 UNITS SUBCUTANEOUSLY ONCE DAILY AS DIRECTED 20 mL 5  . Lancet Devices (EASY TOUCH LANCING DEVICE) MISC Use to check diabetes 4 times daily  E 11.65 1 each 11  . metFORMIN (GLUCOPHAGE) 1000 MG tablet TAKE ONE TABLET BY MOUTH TWICE DAILY WITH MEALS 60 tablet 5  . nystatin (NYSTATIN) powder Apply powder to affected area twice daily until rash has resolved 15 g  3  . omeprazole (PRILOSEC) 40 MG capsule Take 1 capsule (40 mg total) by mouth daily. 30 capsule 3  . doxycycline (VIBRA-TABS) 100 MG tablet Take 1 tablet (100 mg total) by mouth 2 (two) times daily. 20 tablet 0  . traMADol (ULTRAM) 50 MG tablet Take 1 tablet (50 mg total) by mouth every 8 (eight) hours as needed. 30 tablet 0  . cyclobenzaprine (FLEXERIL) 10 MG tablet Take 1 tablet (10 mg total) by mouth at bedtime. (Patient not taking: Reported on 06/27/2018) 30 tablet 5  . Diclofenac Sodium 2 % SOLN Apply 1 pump twice daily. (Patient not taking: Reported on 06/27/2018) 112 g 3  . lidocaine  (LIDODERM) 5 % Place 1 patch onto the skin daily. Reported on 06/19/2016 (Patient not taking: Reported on 06/27/2018) 30 patch 5   No facility-administered medications prior to visit.     Review of Systems;  Patient denies headache, fevers, malaise, unintentional weight loss, skin rash, eye pain, sinus congestion and sinus pain, sore throat, dysphagia,  hemoptysis , cough, dyspnea, wheezing, chest pain, palpitations, orthopnea, edema, abdominal pain, nausea, melena, diarrhea, constipation, flank pain, dysuria, hematuria, urinary  Frequency, nocturia, numbness, tingling, seizures,  Focal weakness, Loss of consciousness,  Tremor, insomnia, depression, anxiety, and suicidal ideation.      Objective:  BP (!) 144/84 (BP Location: Left Arm, Patient Position: Sitting, Cuff Size: Normal)   Pulse 88   Temp 98.2 F (36.8 C) (Oral)   Resp 16   Ht 5\' 4"  (1.626 m)   Wt 190 lb 3.2 oz (86.3 kg)   SpO2 97%   BMI 32.65 kg/m   BP Readings from Last 3 Encounters:  06/27/18 (!) 144/84  04/26/18 (!) 142/80  03/28/18 122/80    Wt Readings from Last 3 Encounters:  06/27/18 190 lb 3.2 oz (86.3 kg)  04/26/18 189 lb (85.7 kg)  03/28/18 187 lb 6.4 oz (85 kg)    General appearance: alert, cooperative and appears stated age Ears: normal TM's and external ear canals both ears Throat: lips, mucosa, and tongue normal; teeth and gums normal Neck: no adenopathy, no carotid bruit, supple, symmetrical, trachea midline and thyroid not enlarged, symmetric, no tenderness/mass/nodules Back: symmetric, no curvature. ROM normal. No CVA tenderness. Lungs: clear to auscultation bilaterally Heart: regular rate and rhythm, S1, S2 normal, no murmur, click, rub or gallop Abdomen: soft, non-tender; bowel sounds normal; no masses,  no organomegaly Pulses: 2+ and symmetric Skin: bilateral resolving boils under both breasts..  No warmth, fluctuance.  Spontaneous drainage noted.  No rashes or lesions. Lymph nodes: Cervical,  supraclavicular, and axillary nodes normal.  Lab Results  Component Value Date   HGBA1C 6.4% 03/03/2018   HGBA1C 6.5 11/01/2017   HGBA1C 6.2 07/23/2017    Lab Results  Component Value Date   CREATININE 0.76 12/31/2017   CREATININE 0.64 07/22/2017   CREATININE 0.49 12/18/2016    Lab Results  Component Value Date   WBC 10.4 12/31/2017   HGB 11.4 (L) 12/31/2017   HCT 34.1 (L) 12/31/2017   PLT 459 (H) 12/31/2017   GLUCOSE 90 12/31/2017   CHOL 135 07/22/2017   TRIG 95.0 07/22/2017   HDL 45.60 07/22/2017   LDLDIRECT 94.0 09/25/2015   LDLCALC 70 07/22/2017   ALT 27 12/31/2017   AST 19 12/31/2017   NA 138 12/31/2017   K 4.0 12/31/2017   CL 104 12/31/2017   CREATININE 0.76 12/31/2017   BUN 12 12/31/2017   CO2 27 12/31/2017   TSH 1.75 11/09/2016  HGBA1C 6.4% 03/03/2018   MICROALBUR <0.7 06/23/2017    Dg Knee Complete 4 Views Left  Result Date: 04/27/2018 CLINICAL DATA:  Chronic knee pain.  No known injury. EXAM: LEFT KNEE - COMPLETE 4+ VIEW COMPARISON:  06/18/2015 FINDINGS: Early joint space narrowing and spurring noted in the medial compartment. No acute bony abnormality. Specifically, no fracture, subluxation, or dislocation. No joint effusion. IMPRESSION: Early degenerative changes in the medial compartment. No acute bony abnormality. Electronically Signed   By: Rolm Baptise M.D.   On: 04/27/2018 08:36    Assessment & Plan:   Problem List Items Addressed This Visit    Urgency incontinence    Without dysuria.  POCT was negative for glucose and leukocytes.  She has declined medical treatment with anticholinergics at this time.       Uncontrolled type 2 diabetes mellitus with retinopathy, with long-term current use of insulin ([place])    Managed by Endocrinology with excellent control.  Encouraged to continue quarterly follow up  Lab Results  Component Value Date   HGBA1C 6.4% 03/03/2018         Relevant Orders   Urine Microalbumin w/creat. ratio   Hyperlipidemia  associated with type 2 diabetes mellitus ([place])    She is tolerating a high potency statin without side effects.  LFTs have been repeatedly normal.  Lab Results  Component Value Date   ALT 27 12/31/2017   AST 19 12/31/2017   ALKPHOS 82 07/22/2017   BILITOT 0.3 12/31/2017         HIDRADENITIS SUPPURATIVA    She has no fluctuant areas and the are under left breast is draining spontaneously.  Changing abx to Septra since she has been noncompliant with doxycycline and may have antibiotic resistance..  Continue probiotic,  Dial soap daily and weekly Hibiclens. Advised to call if ares become non draining and more painful,  bc she will need surgical incision and drainage       Chronic pain of multiple sites    I have refilled her tramadol.  encouraged to use tylenol,  Up to 2000 mg daily.  Continue cymbalta and gabapentin       Relevant Medications   traMADol (ULTRAM) 50 MG tablet   Carpal tunnel syndrome on right    Referral to [facility] for consideration of CTS       Relevant Orders   Ambulatory referral to Orthopedic Surgery    Other Visit Diagnoses    Urinary incontinence, unspecified type    -  Primary   Relevant Orders   POCT Urinalysis Dipstick (Completed)   Urine Culture   Urine Microscopic Only   Chronic left shoulder pain       Relevant Medications   traMADol (ULTRAM) 50 MG tablet   Other Relevant Orders   Ambulatory referral to Orthopedic Surgery    A total of 25 minutes of face to face time was spent with patient more than half of which was spent in counselling about the above mentioned conditions  and coordination of care   I have discontinued Dorraine C. Coppedge's Diclofenac Sodium, traMADol, lidocaine, cyclobenzaprine, and doxycycline. I have also changed her traMADol. Additionally, I am having her start on sulfamethoxazole-trimethoprim. Lastly, I am having her maintain her EASY TOUCH LANCING DEVICE, glucose blood, omeprazole, atorvastatin,  gabapentin, DULoxetine, insulin NPH Human, insulin regular, FREESTYLE LIBRE 14 DAY SENSOR, metFORMIN, and nystatin.  Meds ordered this encounter  Medications  . sulfamethoxazole-trimethoprim (BACTRIM DS,SEPTRA DS) 800-160 MG tablet  Sig: Take 1 tablet by mouth 2 (two) times daily.    Dispense:  14 tablet    Refill:  0  . DISCONTD: traMADol (ULTRAM) 50 MG tablet    Sig: Take 1 tablet (50 mg total) by mouth every 8 (eight) hours as needed.    Dispense:  30 tablet    Refill:  0  . traMADol (ULTRAM) 50 MG tablet    Sig: Take 1 tablet (50 mg total) by mouth daily as needed.    Dispense:  30 tablet    Refill:  5    Medications Discontinued During This Encounter  Medication Reason  . lidocaine (LIDODERM) 5 % Prescription never filled  . traMADol (ULTRAM) 50 MG tablet Reorder  . traMADol (ULTRAM) 50 MG tablet Reorder  . doxycycline (VIBRA-TABS) 100 MG tablet   . cyclobenzaprine (FLEXERIL) 10 MG tablet   . Diclofenac Sodium 2 % SOLN     Follow-up: No follow-ups on file.   Crecencio Mc, MD

## 2018-06-27 NOTE — Patient Instructions (Signed)
   I am giving you a second round of antibiotic for the breast boils:   Septra DS twice daily for one week  You can stop the doxycycline    I have refilled the tramadol and made the referral to Dr Maggie Font at [facility] do not appear to have a uTI.  If the culture is negative as well,  I can prescribe medication to make your bladder less "urgent"

## 2018-06-28 ENCOUNTER — Other Ambulatory Visit: Payer: Self-pay | Admitting: Internal Medicine

## 2018-06-28 DIAGNOSIS — R52 Pain, unspecified: Secondary | ICD-10-CM

## 2018-06-28 DIAGNOSIS — G8929 Other chronic pain: Secondary | ICD-10-CM | POA: Insufficient documentation

## 2018-06-28 DIAGNOSIS — M542 Cervicalgia: Secondary | ICD-10-CM | POA: Insufficient documentation

## 2018-06-28 DIAGNOSIS — N3941 Urge incontinence: Secondary | ICD-10-CM | POA: Insufficient documentation

## 2018-06-28 LAB — MICROALBUMIN / CREATININE URINE RATIO
Creatinine,U: 39.2 mg/dL
MICROALB/CREAT RATIO: 1.8 mg/g (ref 0.0–30.0)
Microalb, Ur: <0.7 mg/dL (ref 0.0–1.9)

## 2018-06-28 LAB — URINALYSIS, MICROSCOPIC ONLY: RBC / HPF: NONE SEEN (ref 0–?)

## 2018-06-28 LAB — URINE CULTURE
MICRO NUMBER: 90893373
Result:: NO GROWTH
SPECIMEN QUALITY: ADEQUATE

## 2018-06-28 NOTE — Assessment & Plan Note (Addendum)
She has no fluctuant areas and the are under left breast is draining spontaneously.  Changing abx to Septra since she has been noncompliant with doxycycline and may have antibiotic resistance..  Continue probiotic,  Dial soap daily and weekly Hibiclens. Advised to call if ares become non draining and more painful,  bc she will need surgical incision and drainage

## 2018-06-28 NOTE — Assessment & Plan Note (Signed)
Managed by Endocrinology with excellent control.  Encouraged to continue quarterly follow up  Lab Results  Component Value Date   HGBA1C 6.4% 03/03/2018

## 2018-06-28 NOTE — Assessment & Plan Note (Signed)
I have refilled her tramadol.  encouraged to use tylenol,  Up to 2000 mg daily.  Continue cymbalta and gabapentin

## 2018-06-28 NOTE — Assessment & Plan Note (Signed)
Referral to Dr[facility] for consideration of CTS

## 2018-06-28 NOTE — Assessment & Plan Note (Signed)
She is tolerating a high potency statin without side effects.  LFTs have been repeatedly normal.  Lab Results  Component Value Date   ALT 27 12/31/2017   AST 19 12/31/2017   ALKPHOS 82 07/22/2017   BILITOT 0.3 12/31/2017

## 2018-06-28 NOTE — Assessment & Plan Note (Signed)
Without dysuria.  POCT was negative for glucose and leukocytes.  She has declined medical treatment with anticholinergics at this time.

## 2018-07-01 ENCOUNTER — Ambulatory Visit: Payer: Self-pay | Admitting: Internal Medicine

## 2018-07-14 ENCOUNTER — Telehealth: Payer: Self-pay

## 2018-07-14 NOTE — Telephone Encounter (Signed)
Copied from [place] [phone]. Topic: Inquiry >> Jul 14, 2018 10:00 AM Oliver Pila B wrote: Reason for CRM: [facility]; Dr. Grandville Silos office called to get the EMG results faxed over w/ the referral Fax: [phone] >> Jul 14, 2018 12:10 PM Lennox Solders wrote: Pt has an appt today >> Jul 14, 2018  4:08 PM Ivar Drape wrote: MaKaylah w/[facility] would like the fax that was just sent to them to be refaxed.  They received half pages.

## 2018-07-14 NOTE — Telephone Encounter (Signed)
Not sure if this needs to come to you or not.

## 2018-07-15 NOTE — Telephone Encounter (Signed)
faxed

## 2018-07-19 NOTE — Telephone Encounter (Signed)
[facility] calling and states that they have not received the EMG results yet. Would like these to be re faxed please.  Fax#: [phone]

## 2018-07-19 NOTE — Telephone Encounter (Signed)
Faxed as requested

## 2018-07-22 ENCOUNTER — Encounter: Payer: Self-pay | Admitting: Internal Medicine

## 2018-07-22 ENCOUNTER — Ambulatory Visit (INDEPENDENT_AMBULATORY_CARE_PROVIDER_SITE_OTHER): Payer: BC Managed Care – PPO | Admitting: Internal Medicine

## 2018-07-22 VITALS — BP 132/84 | HR 86 | Ht 64.0 in | Wt 186.0 lb

## 2018-07-22 DIAGNOSIS — E1165 Type 2 diabetes mellitus with hyperglycemia: Secondary | ICD-10-CM

## 2018-07-22 DIAGNOSIS — E1169 Type 2 diabetes mellitus with other specified complication: Secondary | ICD-10-CM

## 2018-07-22 DIAGNOSIS — Z794 Long term (current) use of insulin: Secondary | ICD-10-CM

## 2018-07-22 DIAGNOSIS — IMO0002 Reserved for concepts with insufficient information to code with codable children: Secondary | ICD-10-CM

## 2018-07-22 DIAGNOSIS — E11319 Type 2 diabetes mellitus with unspecified diabetic retinopathy without macular edema: Secondary | ICD-10-CM

## 2018-07-22 DIAGNOSIS — E785 Hyperlipidemia, unspecified: Secondary | ICD-10-CM | POA: Diagnosis not present

## 2018-07-22 NOTE — Progress Notes (Signed)
Patient ID: [name], female   DOB: 05-17-1961, 57 y.o.   MRN: [id number]  HPI: [name] is a 57 y.o.-year-old female, returning for f/u for DM2, dx ~2000, insulin-dependent since 2010, uncontrolled, with complications (PN, + DR). Last visit 4 months ago.  She had a steroid inj in hand >> sugars 500s last week, now improved, but still slightly higher than at last visit..  She also has a painful shoulder >> will have an MRI tonight.  Last hemoglobin A1c was: Lab Results  Component Value Date   HGBA1C 6.4% 03/03/2018   HGBA1C 6.5 11/01/2017   HGBA1C 6.2 07/23/2017   Pt is on: Insulin Before breakfast Before lunch Before dinner  R 10-15 units 10-15 20-25  NPH 15 - 25   Metformin 1000 mg 2x a day   Pt is checking her sugars 1-3 times a day: - am:  108-118, 201 >> 109-140, 164 >> 67-147, 167, 196 - 2h after b'fast: 154 >> n/c > 203 >> n/c - lunch:  61-71 >> 90s >> 80-94 >> 100-118 - mid-afternoon:  45x1 , 90-115 >> n/c - before dinner: 135 >> 108-114 >> n/c - 2h after dinner:  n/c >> 216, 219, 451 >> n/c  - bedtime:    100-150s >> 140-150, 200s >> 160-180 - nighttime: 145-239, 338 >> n/c Lowest sugar was 45 >> 38 x1, 45 x1 >> 65 >> 67; she has hypoglycemia awareness in the 80s. Highest sugar was  263 (banana pudding) >> 203.  -No CKD, last BUN/creatinine:  Lab Results  Component Value Date   BUN 12 12/31/2017   CREATININE 0.76 12/31/2017   Reviewed ACR levels: Lab Results  Component Value Date   MICRALBCREAT 1.8 06/27/2018   MICRALBCREAT 1.6 06/23/2017   MICRALBCREAT 0.7 06/19/2016   MICRALBCREAT 0.4 09/25/2015   MICRALBCREAT 0.3 07/30/2014   MICRALBCREAT 0.5 06/19/2013   MICRALBCREAT 1.5 01/31/2013   MICRALBCREAT 2.6 11/20/2011  She is not on an ACE inhibitor/ARB.  - + HL; last set of lipids: Lab Results  Component Value Date   CHOL 135 07/22/2017   HDL 45.60 07/22/2017   LDLCALC 70 07/22/2017   LDLDIRECT 94.0 09/25/2015   TRIG 95.0 07/22/2017   CHOLHDL 3 07/22/2017  On Lipitor.  - last eye exam was in 03/2017: + DR left more than right.  She also has glaucoma.  She sees Dr. Arnoldo Morale. - She mentions numbness and tingling in her feet. Seeing a podiatrist.  She is taking 2 tablets of Neurontin a day.  She also has a history of hypertension, episodic alcohol abuse, GERD, low back pain, OA.   ROS: Constitutional: no weight gain/no weight loss, no fatigue, no subjective hyperthermia, no subjective hypothermia Eyes: no blurry vision, no xerophthalmia ENT: no sore throat, no nodules palpated in throat, no dysphagia, no odynophagia, no hoarseness Cardiovascular: no CP/no SOB/no palpitations/no leg swelling Respiratory: no cough/no SOB/no wheezing Gastrointestinal: no N/no V/no D/no C/no acid reflux Musculoskeletal: + muscle aches/+ joint aches Skin: no rashes, no hair loss Neurological: no tremors/+ numbness/+ tingling/no dizziness  I reviewed pt's medications, allergies, PMH, social hx, family hx, and changes were documented in the history of present illness. Otherwise, unchanged from my initial visit note.  Past Medical History:  Diagnosis Date  . Arthritis   . Campylobacter enteritis May 2012   [facility] admission  . Carpal tunnel syndrome, bilateral   . Diabetes mellitus   . Gastritis with bleeding due to alcohol 2010   [facility] admission  .  GERD (gastroesophageal reflux disease)   . Hyperlipidemia   . Hypertension    no meds  . Wears glasses    Past Surgical History:  Procedure Laterality Date  . CARPAL TUNNEL RELEASE Left 05/14/2014   Procedure: LEFT ULNAR NEUROPLASTY AT ELBOW AND ENDOSCOPIC CARPAL TUNNEL RELEASE;  Surgeon: Jolyn Nap, MD;  Location: [place];  Service: Orthopedics;  Laterality: Left;  . CYST EXCISION  1995   under arms  . endometrial biopsy  2004   benign  . FOOT OSTEOTOMY  2010   right-spurs  . TONSILLECTOMY    . ULNAR NERVE TRANSPOSITION Left 05/14/2014   Procedure: ULNAR NERVE  DECOMPRESSION/TRANSPOSITION;  Surgeon: Jolyn Nap, MD;  Location: [place];  Service: Orthopedics;  Laterality: Left;   Social History   Socioeconomic History  . Marital status: Single    Spouse name: Not on file  . Number of children: Not on file  . Years of education: Not on file  . Highest education level: Not on file  Occupational History  . Not on file  Social Needs  . Financial resource strain: Not on file  . Food insecurity:    Worry: Not on file    Inability: Not on file  . Transportation needs:    Medical: Not on file    Non-medical: Not on file  Tobacco Use  . Smoking status: Former Smoker    Last attempt to quit: 05/09/2012    Years since quitting: 6.2  . Smokeless tobacco: Never Used  Substance and Sexual Activity  . Alcohol use: No    Comment: not for 7 yr  . Drug use: Yes    Types: Marijuana    Comment: sometimes  . Sexual activity: Yes    Comment: same partner x 10 years  Lifestyle  . Physical activity:    Days per week: Not on file    Minutes per session: Not on file  . Stress: Not on file  Relationships  . Social connections:    Talks on phone: Not on file    Gets together: Not on file    Attends religious service: Not on file    Active member of club or organization: Not on file    Attends meetings of clubs or organizations: Not on file    Relationship status: Not on file  . Intimate partner violence:    Fear of current or ex partner: Not on file    Emotionally abused: Not on file    Physically abused: Not on file    Forced sexual activity: Not on file  Other Topics Concern  . 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   She lives at home with daughter and grandson.   Current Outpatient Medications on File Prior to Visit  Medication Sig Dispense Refill  . atorvastatin (LIPITOR) 40 MG tablet TAKE ONE TABLET BY MOUTH ONCE DAILY 30 tablet 5  . Continuous Blood Gluc Sensor  (FREESTYLE LIBRE 14 DAY SENSOR) MISC 1 each by Does not apply route every 14 (fourteen) days. Change every 2 weeks 2 each 11  . DULoxetine (CYMBALTA) 30 MG capsule Take one 30 mg tablet by mouth once a day for the first week. Then increase to two 30 mg tablets ( total 60mg ) by mouth once daily. 60 capsule 3  . gabapentin (NEURONTIN) 300 MG capsule Take 1 capsule (300 mg total) by mouth 2 (two) times  daily. 180 capsule 1  . glucose blood (BAYER CONTOUR TEST) test strip TEST BLOOD SUGARS 3 TIMES DAILY 200 each 11  . insulin NPH Human (NOVOLIN N RELION) 100 UNIT/ML injection Inject 15 units under skin in the AM and 25 units in the evening 30 mL 5  . insulin regular (NOVOLIN R RELION) 100 units/mL injection INJECT UP TO 50 UNITS SUBCUTANEOUSLY ONCE DAILY AS DIRECTED 20 mL 5  . Lancet Devices (EASY TOUCH LANCING DEVICE) MISC Use to check diabetes 4 times daily  E 11.65 1 each 11  . metFORMIN (GLUCOPHAGE) 1000 MG tablet TAKE ONE TABLET BY MOUTH TWICE DAILY WITH MEALS 60 tablet 5  . nystatin (NYSTATIN) powder Apply powder to affected area twice daily until rash has resolved 15 g 3  . omeprazole (PRILOSEC) 40 MG capsule Take 1 capsule (40 mg total) by mouth daily. 30 capsule 3  . omeprazole (PRILOSEC) 40 MG capsule TAKE 1 CAPSULE BY MOUTH ONCE DAILY 90 capsule 1  . sulfamethoxazole-trimethoprim (BACTRIM DS,SEPTRA DS) 800-160 MG tablet Take 1 tablet by mouth 2 (two) times daily. 14 tablet 0  . traMADol (ULTRAM) 50 MG tablet Take 1 tablet (50 mg total) by mouth daily as needed. 30 tablet 5  . [DISCONTINUED] esomeprazole (NEXIUM) 40 MG capsule Take 40 mg by mouth daily.     No current facility-administered medications on file prior to visit.    Allergies  Allergen Reactions  . Oxycodone-Acetaminophen     REACTION: Unknown reaction  . Penicillins Rash   Family History  Problem Relation Age of Onset  . Hypertension Mother   . Stroke Father   . Diabetes Maternal Grandmother   . Hypertension Maternal  Grandmother     PE: BP 132/84   Pulse 86   Ht 5\' 4"  (1.626 m)   Wt 186 lb (84.4 kg)   SpO2 98%   BMI 31.93 kg/m  Body mass index is 31.93 kg/m. Wt Readings from Last 3 Encounters:  07/22/18 186 lb (84.4 kg)  06/27/18 190 lb 3.2 oz (86.3 kg)  04/26/18 189 lb (85.7 kg)   Constitutional: overweight, in NAD Eyes: PERRLA, EOMI, no exophthalmos ENT: moist mucous membranes, no thyromegaly, no cervical lymphadenopathy Cardiovascular: RRR, No MRG Respiratory: CTA B Gastrointestinal: abdomen soft, NT, ND, BS+ Musculoskeletal: no deformities, strength intact in all 4 Skin: moist, warm, no rashes Neurological: no tremor with outstretched hands, DTR normal in all 4  ASSESSMENT: 1. DM2, insulin-dependent, uncontrolled, with complications - peripheral neuropathy - DR   2. Obesity class 1 - BMI 32.79  BMI Classification:  < 18.5 underweight   18.5-24.9 normal weight   25.0-29.9 overweight   30.0-34.9 class I obesity   35.0-39.9 class II obesity   ? 40.0 class III obesity   3. HL  PLAN:  1. Patient with long-standing, previously uncontrolled diabetes, now with better control after she started to take the NPH in the R insulin consistently, as prescribed.  At last visit, sugars were at goal throughout the day and her HbA1c decreased to 6.4%.  She did not have low blood sugars anymore, with the exception of one situation when she was very active and delayed a meal. - At this visit, sugars are slightly higher, but they are improving after her steroid injection last week.  I do not feel we need to change her regimen at this time.  However, I advised her to pay attention to her sugars and make sure they do not increase.  We discussed about the  fact the steroid injections will cause significant hyperglycemia so she needs only take this if absolutely necessary.  She is aware of this and will try. - I advised her to: Patient Instructions   Please continue: Insulin Before breakfast  Before lunch Before dinner  R 10-15 units 10-15 20-25  NPH 15 - 25    Continue Metformin 1000 mg 2x a day   Please come back for a follow-up appointment in 4 months with your log.  - today, HbA1c is 6.8% (slightly higher) - continue checking sugars at different times of the day - check 3x a day, rotating checks - advised for yearly eye exams >> she is UTD - Return to clinic in 4 mo with sugar log     2. Obesity class 1 -Lost about 3 pounds since last visit  3. HL - Reviewed latest lipid panel from 06/2017 and this was excellent Lab Results  Component Value Date   CHOL 135 07/22/2017   HDL 45.60 07/22/2017   LDLCALC 70 07/22/2017   LDLDIRECT 94.0 09/25/2015   TRIG 95.0 07/22/2017   CHOLHDL 3 07/22/2017  - Continues the Lipitor without side effects.  Philemon Kingdom, MD PhD [facility] Endocrinology

## 2018-07-22 NOTE — Patient Instructions (Addendum)
Please continue:  Insulin Before breakfast Before lunch Before dinner  R 10-15 units 10-15 20-25  NPH 15 - 25    Continue Metformin 1000 mg 2x a day   Please come back for a follow-up appointment in 4 months with your log.

## 2018-07-25 LAB — POCT GLYCOSYLATED HEMOGLOBIN (HGB A1C): HEMOGLOBIN A1C: 6.8 % — AB (ref 4.0–5.6)

## 2018-07-25 NOTE — Addendum Note (Signed)
Addended by: Drucilla Schmidt on: 07/25/2018 08:19 AM   Modules accepted: Orders

## 2018-07-30 ENCOUNTER — Other Ambulatory Visit: Payer: Self-pay | Admitting: Internal Medicine

## 2018-08-02 ENCOUNTER — Other Ambulatory Visit: Payer: Self-pay | Admitting: Family

## 2018-08-02 DIAGNOSIS — M1990 Unspecified osteoarthritis, unspecified site: Secondary | ICD-10-CM

## 2018-09-08 ENCOUNTER — Telehealth: Payer: Self-pay | Admitting: Internal Medicine

## 2018-09-08 MED ORDER — SULFAMETHOXAZOLE-TRIMETHOPRIM 800-160 MG PO TABS: 1.0000 | ORAL_TABLET | Freq: Two times a day (BID) | ORAL | 0 refills | Status: DC

## 2018-09-08 NOTE — Telephone Encounter (Signed)
Pt has been scheduled for tomorrow at 4:30pm.

## 2018-09-08 NOTE — Telephone Encounter (Signed)
Please advise 

## 2018-09-08 NOTE — Telephone Encounter (Signed)
Copied from [place] [id number]. Topic: Quick Communication - See Telephone Encounter >> Sep 08, 2018 12:30 PM Vernona Rieger wrote: CRM for notification. See Telephone encounter for: 09/08/18.  Patient states that she would like an antibiotic for her breasts/stomach. She said that Dr Derrel Nip is aware of this situation. She would like the nurse to call her back @ [phone]

## 2018-09-08 NOTE — Telephone Encounter (Signed)
She is correct   Septra ha been called in, to start tonight. Doe snot need to be seen unless she requests appt.

## 2018-09-09 ENCOUNTER — Ambulatory Visit: Payer: Self-pay | Admitting: Internal Medicine

## 2018-09-09 ENCOUNTER — Telehealth: Payer: Self-pay | Admitting: Internal Medicine

## 2018-09-09 NOTE — Telephone Encounter (Signed)
Patient says place is draining well " just needed antibiotics " advised patient they have been called to pharmacy. Patient says she does not want to come in at 430 today.

## 2018-09-09 NOTE — Telephone Encounter (Signed)
Antibiotics were called in yesterday afternoon but patinet was not notified .  Please call her and find out if the abscess is draining.  If it is not,  I will try to get her seen by [facility] (any surgeon there) this afternoon

## 2018-09-12 NOTE — Telephone Encounter (Signed)
Spoke with pt and she stated that she has been taking the antibiotic for two days. Pt has been prescribed a seven day course. Pt stated that it is draining still and that it is very tender. Pt was advised to complete the course of antibiotic and if not better or pain gets worse to give [name] a call back so we can get her an appt.

## 2018-09-26 ENCOUNTER — Telehealth: Payer: Self-pay

## 2018-09-26 DIAGNOSIS — Z1239 Encounter for other screening for malignant neoplasm of breast: Secondary | ICD-10-CM

## 2018-09-26 NOTE — Addendum Note (Signed)
Addended by: Crecencio Mc on: 09/26/2018 04:27 PM   Modules accepted: Orders

## 2018-09-26 NOTE — Telephone Encounter (Signed)
Copied from [place] [id number]. Topic: Referral - Request for Referral >> Sep 26, 2018  9:06 AM Rothrock, Lanice Schwab wrote: Has patient seen PCP for this complaint? NO *If NO, is insurance requiring patient see PCP for this issue before PCP can refer them? NO Referral for which specialty: Mammogram Preferred provider/office: [facility] Reason for referral: Screening Mammogram

## 2018-09-26 NOTE — Telephone Encounter (Signed)
MAMMOGRAM ORDERED

## 2018-09-26 NOTE — Telephone Encounter (Signed)
No order for mammogram in her chart.

## 2018-09-26 NOTE — Telephone Encounter (Signed)
This is a Dr. Derrel Nip patient   [name]

## 2018-11-04 ENCOUNTER — Ambulatory Visit
Admission: RE | Admit: 2018-11-04 | Discharge: 2018-11-04 | Disposition: A | Discharge status: Home / Self Care | Payer: BC Managed Care – PPO | Source: Ambulatory Visit | Attending: Internal Medicine | Admitting: Internal Medicine

## 2018-11-04 DIAGNOSIS — Z1239 Encounter for other screening for malignant neoplasm of breast: Secondary | ICD-10-CM

## 2018-11-05 ENCOUNTER — Other Ambulatory Visit: Payer: Self-pay | Admitting: Internal Medicine

## 2018-11-16 ENCOUNTER — Telehealth: Payer: Self-pay

## 2018-11-16 NOTE — Telephone Encounter (Signed)
Copied from [place] #[id number]. Topic: General - Other >> Nov 16, 2018  3:24 PM Virl Axe D wrote: Reason for CRM: Pt stated that her breasts are draining. She has appt for 12/21/18 at 4pm. Pt would like to know if an antibiotic can be sent in before appt. Pt did have mammogram done as well. Please advise

## 2018-11-17 MED ORDER — SULFAMETHOXAZOLE-TRIMETHOPRIM 800-160 MG PO TABS: 1.0000 | ORAL_TABLET | Freq: Two times a day (BID) | ORAL | 0 refills | Status: DC

## 2018-11-17 NOTE — Telephone Encounter (Signed)
Spoke with [name] to let her know that Dr. Derrel Nip has sent in an antibiotic and that she needs to take a probiotic for at least 3 weeks.

## 2018-11-17 NOTE — Telephone Encounter (Signed)
Yes septra DS sent to pharmacy..  Daily use of Probiotics for  3 weeks advised to reduce risk of C dificile colitis.

## 2018-11-17 NOTE — Addendum Note (Signed)
Addended by: Crecencio Mc on: 11/17/2018 04:31 PM   Modules accepted: Orders

## 2018-11-25 ENCOUNTER — Ambulatory Visit (INDEPENDENT_AMBULATORY_CARE_PROVIDER_SITE_OTHER): Payer: BC Managed Care – PPO | Admitting: Internal Medicine

## 2018-11-25 ENCOUNTER — Encounter: Payer: Self-pay | Admitting: Internal Medicine

## 2018-11-25 VITALS — BP 130/60 | HR 88 | Ht 64.0 in | Wt 183.0 lb

## 2018-11-25 DIAGNOSIS — E11319 Type 2 diabetes mellitus with unspecified diabetic retinopathy without macular edema: Secondary | ICD-10-CM

## 2018-11-25 DIAGNOSIS — Z6832 Body mass index (BMI) 32.0-32.9, adult: Secondary | ICD-10-CM

## 2018-11-25 DIAGNOSIS — Z794 Long term (current) use of insulin: Secondary | ICD-10-CM

## 2018-11-25 DIAGNOSIS — E1165 Type 2 diabetes mellitus with hyperglycemia: Secondary | ICD-10-CM

## 2018-11-25 DIAGNOSIS — IMO0002 Reserved for concepts with insufficient information to code with codable children: Secondary | ICD-10-CM

## 2018-11-25 DIAGNOSIS — E669 Obesity, unspecified: Secondary | ICD-10-CM | POA: Diagnosis not present

## 2018-11-25 DIAGNOSIS — E785 Hyperlipidemia, unspecified: Secondary | ICD-10-CM

## 2018-11-25 DIAGNOSIS — E1169 Type 2 diabetes mellitus with other specified complication: Secondary | ICD-10-CM

## 2018-11-25 LAB — POCT GLYCOSYLATED HEMOGLOBIN (HGB A1C): HEMOGLOBIN A1C: 7.8 % — AB (ref 4.0–5.6)

## 2018-11-25 NOTE — Addendum Note (Signed)
Addended by: Cardell Peach I on: 11/25/2018 03:54 PM   Modules accepted: Orders

## 2018-11-25 NOTE — Progress Notes (Signed)
Patient ID: [name], female   DOB: 17-Dec-1960, 57 y.o.   MRN: [id number]  HPI: [name] is a 57 y.o.-year-old female, returning for f/u for DM2, dx ~2000, insulin-dependent since 2010, uncontrolled, with complications (PN, + DR). Last visit 4 months ago.  On ABx now for skin inf.  She continues to have L shoulder pain and generalized joint aches.  She missed some insulin doses >> sugars higher then.  Last hemoglobin A1c was: Lab Results  Component Value Date   HGBA1C 6.8 (A) 07/25/2018   HGBA1C 6.4% 03/03/2018   HGBA1C 6.5 11/01/2017   Pt is on: Insulin Before breakfast Before lunch Before dinner  R  10-15  10-15  20-25  NPH  15 -  25    Continue metformin 1000 mg 2x a day.  Pt is checking her sugars 0-1x times a day - no log, no meter: - am: 109-140, 164 >> 67-147, 167, 196 >> 100-120, 300 (did not take the insulin the night before) - 2h after b'fast: 154 >> n/c > 203 >> n/c - lunch:   90s >> 80-94 >> 100-118 >> n/c - mid-afternoon:  45x1 , 90-115 >> n/c - before dinner: 135 >> 108-114 >> n/c - 2h after dinner:  216, 219, 451 >> n/c  - bedtime:  140-150, 200s >> 160-180 >> 90-168 - nighttime: 145-239, 338 >> n/c Lowest sugar was 45 >> 38 x1, 45 x1 >> 65 >> 67 >> felt low but did not check (took insulin and dropped before eating); she has hypoglycemia awareness in the 80s. Highest sugar was  263 (banana pudding) >> 203 >> 200.  -No CKD, last BUN/creatinine:  Lab Results  Component Value Date   BUN 12 12/31/2017   CREATININE 0.76 12/31/2017   ACR levels were normal: Lab Results  Component Value Date   MICRALBCREAT 1.8 06/27/2018   MICRALBCREAT 1.6 06/23/2017   MICRALBCREAT 0.7 06/19/2016   MICRALBCREAT 0.4 09/25/2015   MICRALBCREAT 0.3 07/30/2014   MICRALBCREAT 0.5 06/19/2013   MICRALBCREAT 1.5 01/31/2013   MICRALBCREAT 2.6 11/20/2011  She is not on an ACE inhibitor/ARB.  -+ HL; last set of lipids: Lab Results  Component Value Date   CHOL 135  07/22/2017   HDL 45.60 07/22/2017   LDLCALC 70 07/22/2017   LDLDIRECT 94.0 09/25/2015   TRIG 95.0 07/22/2017   CHOLHDL 3 07/22/2017  On Lipitor.  - last eye exam was in 03/2017: + DR in left eye more than the right.  + Glaucoma.  She sees Dr. Arnoldo Morale.  -She does have numbness and tingling in her feet. Seeing a podiatrist.  She takes 2 tablets of Neurontin a day  She also has a history of hypertension, episodic alcohol abuse, GERD, low back pain, OA.   ROS: Constitutional: no weight gain/no weight loss, no fatigue, no subjective hyperthermia, no subjective hypothermia Eyes: no blurry vision, no xerophthalmia ENT: no sore throat, no nodules palpated in neck, no dysphagia, no odynophagia, no hoarseness Cardiovascular: no CP/no SOB/no palpitations/no leg swelling Respiratory: no cough/no SOB/no wheezing Gastrointestinal: no N/no V/no D/no C/no acid reflux Musculoskeletal: no muscle aches/+ joint aches Skin: no rashes, no hair loss Neurological: no tremors/+ numbness/+ tingling/no dizziness  I reviewed pt's medications, allergies, PMH, social hx, family hx, and changes were documented in the history of present illness. Otherwise, unchanged from my initial visit note.  Past Medical History:  Diagnosis Date  . Arthritis   . Campylobacter enteritis May 2012   [facility] admission  .  Carpal tunnel syndrome, bilateral   . Diabetes mellitus   . Gastritis with bleeding due to alcohol 2010   [facility] admission  . GERD (gastroesophageal reflux disease)   . Hyperlipidemia   . Hypertension    no meds  . Wears glasses    Past Surgical History:  Procedure Laterality Date  . CARPAL TUNNEL RELEASE Left 05/14/2014   Procedure: LEFT ULNAR NEUROPLASTY AT ELBOW AND ENDOSCOPIC CARPAL TUNNEL RELEASE;  Surgeon: Jolyn Nap, MD;  Location: [place];  Service: Orthopedics;  Laterality: Left;  . CYST EXCISION  1995   under arms  . endometrial biopsy  2004   benign  . FOOT OSTEOTOMY   2010   right-spurs  . TONSILLECTOMY    . ULNAR NERVE TRANSPOSITION Left 05/14/2014   Procedure: ULNAR NERVE DECOMPRESSION/TRANSPOSITION;  Surgeon: Jolyn Nap, MD;  Location: [place];  Service: Orthopedics;  Laterality: Left;   Social History   Socioeconomic History  . Marital status: Single    Spouse name: Not on file  . Number of children: Not on file  . Years of education: Not on file  . Highest education level: Not on file  Occupational History  . Not on file  Social Needs  . Financial resource strain: Not on file  . Food insecurity:    Worry: Not on file    Inability: Not on file  . Transportation needs:    Medical: Not on file    Non-medical: Not on file  Tobacco Use  . Smoking status: Former Smoker    Last attempt to quit: 05/09/2012    Years since quitting: 6.5  . Smokeless tobacco: Never Used  Substance and Sexual Activity  . Alcohol use: No    Comment: not for 7 yr  . Drug use: Yes    Types: Marijuana    Comment: sometimes  . Sexual activity: Yes    Comment: same partner x 10 years  Lifestyle  . Physical activity:    Days per week: Not on file    Minutes per session: Not on file  . Stress: Not on file  Relationships  . Social connections:    Talks on phone: Not on file    Gets together: Not on file    Attends religious service: Not on file    Active member of club or organization: Not on file    Attends meetings of clubs or organizations: Not on file    Relationship status: Not on file  . Intimate partner violence:    Fear of current or ex partner: Not on file    Emotionally abused: Not on file    Physically abused: Not on file    Forced sexual activity: Not on file  Other Topics Concern  . 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   She lives at home with daughter and grandson.   Current Outpatient Medications on File Prior to Visit  Medication Sig Dispense Refill  . atorvastatin  (LIPITOR) 40 MG tablet TAKE ONE TABLET BY MOUTH ONCE DAILY 30 tablet 5  . Continuous Blood Gluc Sensor (FREESTYLE LIBRE 14 DAY SENSOR) MISC 1 each by Does not apply route every 14 (fourteen) days. Change every 2 weeks 2 each 11  . DULoxetine (CYMBALTA) 30 MG capsule Take one 30 mg tablet by mouth once a day for the first week. Then increase to two 30 mg tablets ( total 60mg )  by mouth once daily. 60 capsule 3  . gabapentin (NEURONTIN) 300 MG capsule TAKE 1 CAPSULE BY MOUTH TWICE DAILY 180 capsule 1  . glucose blood (BAYER CONTOUR TEST) test strip TEST BLOOD SUGARS 3 TIMES DAILY 200 each 11  . insulin NPH Human (NOVOLIN N RELION) 100 UNIT/ML injection Inject 15 units under skin in the AM and 25 units in the evening 30 mL 5  . insulin regular (NOVOLIN R RELION) 100 units/mL injection INJECT UP TO 50 UNITS SUBCUTANEOUSLY ONCE DAILY AS DIRECTED 20 mL 5  . Lancet Devices (EASY TOUCH LANCING DEVICE) MISC Use to check diabetes 4 times daily  E 11.65 1 each 11  . metFORMIN (GLUCOPHAGE) 1000 MG tablet TAKE ONE TABLET BY MOUTH TWICE DAILY WITH MEALS 60 tablet 5  . NOVOLIN N RELION 100 UNIT/ML injection INJECT 20 UNITS SUBCUTANEOUSLY IN THE MORNING AND 55 IN THE EVENING 3 mL 5  . nystatin (NYSTATIN) powder Apply powder to affected area twice daily until rash has resolved 15 g 3  . omeprazole (PRILOSEC) 40 MG capsule Take 1 capsule (40 mg total) by mouth daily. 30 capsule 3  . sulfamethoxazole-trimethoprim (BACTRIM DS,SEPTRA DS) 800-160 MG tablet Take 1 tablet by mouth 2 (two) times daily. 14 tablet 0  . traMADol (ULTRAM) 50 MG tablet Take 1 tablet (50 mg total) by mouth daily as needed. 30 tablet 5  . [DISCONTINUED] esomeprazole (NEXIUM) 40 MG capsule Take 40 mg by mouth daily.     No current facility-administered medications on file prior to visit.    Allergies  Allergen Reactions  . Oxycodone-Acetaminophen     REACTION: Unknown reaction  . Penicillins Rash   Family History  Problem Relation Age of  Onset  . Hypertension Mother   . Stroke Father   . Diabetes Maternal Grandmother   . Hypertension Maternal Grandmother     PE: BP 130/60   Pulse 88   Ht 5\' 4"  (1.626 m)   Wt 183 lb (83 kg)   SpO2 98%   BMI 31.41 kg/m  Body mass index is 31.41 kg/m. Wt Readings from Last 3 Encounters:  11/25/18 183 lb (83 kg)  07/22/18 186 lb (84.4 kg)  06/27/18 190 lb 3.2 oz (86.3 kg)   Constitutional: overweight, in NAD Eyes: PERRLA, EOMI, no exophthalmos ENT: moist mucous membranes, no thyromegaly, no cervical lymphadenopathy Cardiovascular: RRR, No MRG Respiratory: CTA B Gastrointestinal: abdomen soft, NT, ND, BS+ Musculoskeletal: no deformities, strength intact in all 4 Skin: moist, warm, no rashes Neurological: no tremor with outstretched hands, DTR normal in all 4  ASSESSMENT: 1. DM2, insulin-dependent, uncontrolled, with complications - peripheral neuropathy - DR   2. Obesity class 1 - BMI 32.79  BMI Classification:  < 18.5 underweight   18.5-24.9 normal weight   25.0-29.9 overweight   30.0-34.9 class I obesity   35.0-39.9 class II obesity   ? 40.0 class III obesity   3. HL  PLAN:  1. Patient with longstanding, previously uncontrolled diabetes, but with better control in the last year after she started to take her insulin consistently.  Latest HbA1c was slightly higher, at 6.8%, but we did not change the regimen at that time.  Since then, she started to miss some insulin doses including last night, so this morning her CBG was 300.  We discussed about trying to take the insulin consistently which is a prerequisite for good diabetes control.  We need to avoid getting to the previous situation in which her diabetes was completely  out-of-control with her taking the insulin erratically.  She agree with this and plans to start taking this consistently again.  I do not feel that we need to change her regimen especially now during the holidays but if the sugars remain higher than  target afterwards, may need to increase the doses. - I advised her to: Patient Instructions   Please continue: Insulin Before breakfast Before lunch Before dinner  R  10-15 units  10-15  20-25  NPH  15 -  25    Continue metformin 1000 mg 2x a day   Please come back for a follow-up appointment in 3-4 months with your log.  - today, HbA1c is 7.8% (higher) - continue checking sugars at different times of the day - check 3x a day, rotating checks - advised for yearly eye exams >> she is not UTD - Return to clinic in 3-4 mo with sugar log      2. Obesity class 1 -She lost 7 pounds from 05/2018 despite the holidays!  3. HL - Reviewed latest lipid panel from 06/2017: All fractions at goal Lab Results  Component Value Date   CHOL 135 07/22/2017   HDL 45.60 07/22/2017   LDLCALC 70 07/22/2017   LDLDIRECT 94.0 09/25/2015   TRIG 95.0 07/22/2017   CHOLHDL 3 07/22/2017  - Continues Lipitor without side effects.  Philemon Kingdom, MD PhD [facility]

## 2018-11-25 NOTE — Patient Instructions (Addendum)
Please continue: Insulin Before breakfast Before lunch Before dinner  R  10-15 units  10-15  20-25  NPH  15 -  25    Continue metformin 1000 mg 2x a day   Please come back for a follow-up appointment in 3-4 months with your log.

## 2018-12-19 ENCOUNTER — Encounter: Payer: Self-pay | Admitting: Family Medicine

## 2018-12-19 ENCOUNTER — Encounter

## 2018-12-19 ENCOUNTER — Ambulatory Visit: Payer: BC Managed Care – PPO | Admitting: Family Medicine

## 2018-12-19 DIAGNOSIS — M23204 Derangement of unspecified medial meniscus due to old tear or injury, left knee: Secondary | ICD-10-CM

## 2018-12-19 NOTE — Assessment & Plan Note (Signed)
Discussed with patient again at great length.  Patient does have mild to moderate arthritic changes.  Patient does have a peri-meniscal cyst that I think that contributes to some of the discomfort and pain.  Discussed icing regimen and home exercise.  Discussed which activities to do which was to avoid.  We discussed advanced imaging would be warranted at this point if no improvement.  Patient is in agreement with the plan.  Follow-up with me again in 4 to 6 weeks.

## 2018-12-19 NOTE — Progress Notes (Signed)
[name] [address] [place], [place] [address] Phone: [phone] Subjective:   [name], am serving as a scribe for Dr. Hulan Saas.   CC: Knee pain follow-up  DJM:[id number]  [name] is a 58 y.o. female coming in with complaint of left knee pain on medial aspect. Pain with sitting and when on her feet for prolonged periods. Sharp pain.   Patient has been seen previously and has a chronic meniscal tear.  Attempted to get MRI which patient has declined in the past.  Does have a peri-meniscal cyst.  Denies any fevers chills or any abnormal weight loss.  States that last injection did help for approximately 6 months but now having worsening pain again.    Past Medical History:  Diagnosis Date  . Arthritis   . Campylobacter enteritis May 2012   [facility] admission  . Carpal tunnel syndrome, bilateral   . Diabetes mellitus   . Gastritis with bleeding due to alcohol 2010   [facility] admission  . GERD (gastroesophageal reflux disease)   . Hyperlipidemia   . Hypertension    Gamble meds  . Wears glasses    Past Surgical History:  Procedure Laterality Date  . CARPAL TUNNEL RELEASE Left 05/14/2014   Procedure: LEFT ULNAR NEUROPLASTY AT ELBOW AND ENDOSCOPIC CARPAL TUNNEL RELEASE;  Surgeon: Jolyn Nap, MD;  Location: [place];  Service: Orthopedics;  Laterality: Left;  . CYST EXCISION  1995   under arms  . endometrial biopsy  2004   benign  . FOOT OSTEOTOMY  2010   right-spurs  . TONSILLECTOMY    . ULNAR NERVE TRANSPOSITION Left 05/14/2014   Procedure: ULNAR NERVE DECOMPRESSION/TRANSPOSITION;  Surgeon: Jolyn Nap, MD;  Location: [place];  Service: Orthopedics;  Laterality: Left;   Social History   Socioeconomic History  . Marital status: Single    Spouse name: Not on file  . Number of children: Not on file  . Years of education: Not on file  . Highest education level: Not on file  Occupational  History  . Not on file  Social Needs  . Financial resource strain: Not on file  . Food insecurity:    Worry: Not on file    Inability: Not on file  . Transportation needs:    Medical: Not on file    Non-medical: Not on file  Tobacco Use  . Smoking status: Former Smoker    Last attempt to quit: 05/09/2012    Years since quitting: 6.6  . Smokeless tobacco: Never Used  Substance and Sexual Activity  . Alcohol use: Gamble    Comment: not for 7 yr  . Drug use: Yes    Types: Marijuana    Comment: sometimes  . Sexual activity: Yes    Comment: same partner x 10 years  Lifestyle  . Physical activity:    Days per week: Not on file    Minutes per session: Not on file  . Stress: Not on file  Relationships  . Social connections:    Talks on phone: Not on file    Gets together: Not on file    Attends religious service: Not on file    Active member of club or organization: Not on file    Attends meetings of clubs or organizations: Not on file    Relationship status: Not on file  Other Topics Concern  . 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   She lives at home with daughter and grandson.   Allergies  Allergen Reactions  . Oxycodone-Acetaminophen     REACTION: Unknown reaction  . Penicillins Rash   Family History  Problem Relation Age of Onset  . Hypertension Mother   . Stroke Father   . Diabetes Maternal Grandmother   . Hypertension Maternal Grandmother     Current Outpatient Medications (Endocrine & Metabolic):  .  insulin NPH Human (NOVOLIN N RELION) 100 UNIT/ML injection, Inject 15 units under skin in the AM and 25 units in the evening .  insulin regular (NOVOLIN R RELION) 100 units/mL injection, INJECT UP TO 50 UNITS SUBCUTANEOUSLY ONCE DAILY AS DIRECTED .  metFORMIN (GLUCOPHAGE) 1000 MG tablet, TAKE ONE TABLET BY MOUTH TWICE DAILY WITH MEALS .  NOVOLIN N RELION 100 UNIT/ML injection, INJECT 20 UNITS SUBCUTANEOUSLY IN THE MORNING  AND 55 IN THE EVENING  Current Outpatient Medications (Cardiovascular):  .  atorvastatin (LIPITOR) 40 MG tablet, TAKE ONE TABLET BY MOUTH ONCE DAILY   Current Outpatient Medications (Analgesics):  .  traMADol (ULTRAM) 50 MG tablet, Take 1 tablet (50 mg total) by mouth daily as needed.   Current Outpatient Medications (Other):  Marland Kitchen  Continuous Blood Gluc Sensor (FREESTYLE LIBRE 14 DAY SENSOR) MISC, 1 each by Does not apply route every 14 (fourteen) days. Change every 2 weeks .  DULoxetine (CYMBALTA) 30 MG capsule, Take one 30 mg tablet by mouth once a day for the first week. Then increase to two 30 mg tablets ( total 60mg ) by mouth once daily. Marland Kitchen  gabapentin (NEURONTIN) 300 MG capsule, TAKE 1 CAPSULE BY MOUTH TWICE DAILY .  glucose blood (BAYER CONTOUR TEST) test strip, TEST BLOOD SUGARS 3 TIMES DAILY .  Lancet Devices (EASY TOUCH LANCING DEVICE) MISC, Use to check diabetes 4 times daily  E 11.65 .  nystatin (NYSTATIN) powder, Apply powder to affected area twice daily until rash has resolved .  omeprazole (PRILOSEC) 40 MG capsule, Take 1 capsule (40 mg total) by mouth daily. Marland Kitchen  sulfamethoxazole-trimethoprim (BACTRIM DS,SEPTRA DS) 800-160 MG tablet, Take 1 tablet by mouth 2 (two) times daily.    Past medical history, social, surgical and family history all reviewed in electronic medical record.  Gamble pertanent information unless stated regarding to the chief complaint.   Review of Systems:  Gamble headache, visual changes, nausea, vomiting, diarrhea, constipation, dizziness, abdominal pain, skin rash, fevers, chills, night sweats, weight loss, swollen lymph nodes,  chest pain, shortness of breath, mood changes.  Positive muscle aches, body aches  Objective  Blood pressure 130/80, pulse 93, height 5\' 4"  (1.626 m), weight 191 lb (86.6 kg), SpO2 97 %.   General: Gamble apparent distress alert and oriented x3 mood and affect normal, dressed appropriately.  HEENT: Pupils equal, extraocular movements  intact  Respiratory: Patient's speak in full sentences and does not appear short of breath  Cardiovascular: Gamble lower extremity edema, non tender, Gamble erythema  Skin: Warm dry intact with Gamble signs of infection or rash on extremities or on axial skeleton.  Abdomen: Soft nontender  Neuro: Cranial nerves II through XII are intact, neurovascularly intact in all extremities with 2+ DTRs and 2+ pulses.  Lymph: Gamble lymphadenopathy of posterior or anterior cervical chain or axillae bilaterally.  Gait normal with good balance and coordination.  MSK:  Non tender with full range of motion and good stability and symmetric strength and tone of shoulders, elbows, wrist, hip,  and ankles bilaterally.  Knee: Left valgus deformity noted.  Abnormal thigh to calf ratio.  Tender to palpation over medial and PF joint line.  Sebasto palpated on the medial joint line.  Gamble enlargement. ROM full in flexion and extension and lower leg rotation. instability with valgus force.  painful patellar compression. Patellar glide with moderate crepitus. Patellar and quadriceps tendons unremarkable. Hamstring and quadriceps strength is normal. Contralateral knee shows minimal arthritic changes and good stability  After informed written and verbal consent, patient was seated on exam table. Left knee was prepped with alcohol swab and utilizing anterolateral approach, patient's left knee space was injected with 4:1  marcaine 0.5%: Kenalog 40mg /dL. Patient tolerated the procedure well without immediate complications.   Impression and Recommendations:      The above documentation has been reviewed and is accurate and complete Lyndal Pulley, DO       Note: This dictation was prepared with Dragon dictation along with smaller phrase technology. Any transcriptional errors that result from this process are unintentional.

## 2018-12-19 NOTE — Patient Instructions (Signed)
Good to see you  You are doing better  You still have the cyst in the knee and we can do MRI if not improving  Tried to inject the knee today and really hope it helps See me again in 4-6 weeks to see how you are doing and can discuss the shoulder  Happy New Year!

## 2018-12-21 ENCOUNTER — Ambulatory Visit: Payer: BC Managed Care – PPO | Admitting: Internal Medicine

## 2018-12-21 ENCOUNTER — Encounter: Payer: Self-pay | Admitting: Internal Medicine

## 2018-12-21 VITALS — BP 140/82 | HR 81 | Temp 97.8°F | Resp 15 | Ht 64.0 in | Wt 190.8 lb

## 2018-12-21 DIAGNOSIS — IMO0002 Reserved for concepts with insufficient information to code with codable children: Secondary | ICD-10-CM

## 2018-12-21 DIAGNOSIS — E611 Iron deficiency: Secondary | ICD-10-CM

## 2018-12-21 DIAGNOSIS — N611 Abscess of the breast and nipple: Secondary | ICD-10-CM

## 2018-12-21 DIAGNOSIS — M2550 Pain in unspecified joint: Secondary | ICD-10-CM

## 2018-12-21 DIAGNOSIS — Z794 Long term (current) use of insulin: Secondary | ICD-10-CM

## 2018-12-21 DIAGNOSIS — E11319 Type 2 diabetes mellitus with unspecified diabetic retinopathy without macular edema: Secondary | ICD-10-CM

## 2018-12-21 DIAGNOSIS — I10 Essential (primary) hypertension: Secondary | ICD-10-CM

## 2018-12-21 DIAGNOSIS — M1990 Unspecified osteoarthritis, unspecified site: Secondary | ICD-10-CM

## 2018-12-21 DIAGNOSIS — E1165 Type 2 diabetes mellitus with hyperglycemia: Secondary | ICD-10-CM

## 2018-12-21 MED ORDER — GABAPENTIN 300 MG PO CAPS: 300.0000 mg | ORAL_CAPSULE | Freq: Two times a day (BID) | ORAL | 1 refills | Status: DC

## 2018-12-21 MED ORDER — CEPHALEXIN 500 MG PO CAPS: 500.0000 mg | ORAL_CAPSULE | Freq: Four times a day (QID) | ORAL | 0 refills | Status: DC

## 2018-12-21 NOTE — Patient Instructions (Signed)
I am prescribing cephalexin for your infection , take it 4 times daily  (breakfast, lunch, dinner and bedtime)   Arthritis Arthritis means joint pain. It can also mean joint disease. A joint is a place where bones come together. People who have arthritis may have:  Red joints.  Swollen joints.  Stiff joints.  Warm joints.  A fever.  A feeling of being sick. Follow these instructions at home: Pay attention to any changes in your symptoms. Take these actions to help with your pain and swelling. Medicines  Take over-the-counter and prescription medicines only as told by your doctor.  Do not take aspirin for pain if your doctor says that you may have gout. Activity  Rest your joint if your doctor tells you to.  Avoid activities that make the pain worse.  Exercise your joint regularly as told by your doctor. Try doing exercises like: ? Swimming. ? Water aerobics. ? Biking. ? Walking. Joint Care   If your joint is swollen, keep it raised (elevated) if told by your doctor.  If your joint feels stiff in the morning, try taking a warm shower.  If you have diabetes, do not apply heat without asking your doctor.  If told, apply heat to the joint: ? Put a towel between the joint and the hot pack or heating pad. ? Leave the heat on the area for 20-30 minutes.  If told, apply ice to the joint: ? Put ice in a plastic bag. ? Place a towel between your skin and the bag. ? Leave the ice on for 20 minutes, 2-3 times per day.  Keep all follow-up visits as told by your doctor. Contact a doctor if:  The pain gets worse.  You have a fever. Get help right away if:  You have very bad pain in your joint.  You have swelling in your joint.  Your joint is red.  Many joints become painful and swollen.  You have very bad back pain.  Your leg is very weak.  You cannot control your pee (urine) or poop (stool). This information is not intended to replace advice given to you by  your health care provider. Make sure you discuss any questions you have with your health care provider. Document Released: 02/10/2010 Document Revised: 04/23/2016 Document Reviewed: 02/11/2015 Elsevier Interactive Patient Education  2019 [employer].

## 2018-12-21 NOTE — Progress Notes (Signed)
Subjective:  Patient ID: [name], female    DOB: June 07, 1961  Age: 58 y.o. MRN: [id number]  CC: The primary encounter diagnosis was Boil, breast. Diagnoses of Uncontrolled type 2 diabetes mellitus with retinopathy, with long-term current use of insulin ([place]), Iron deficiency, Arthritis, Polyarthralgia, and Essential hypertension were also pertinent to this visit.  HPI [name] presents for followup on multiple complaints.   Podiatry did a biopsy of skin above both lateral malleoli  Had cortisone injection by smith in left knee  Left arm pain, severe,, for the past year despite surgery I n 2000. For carpal tunnel and elbow surgery,  MRI of cervical spine ordered  . Right  Wrist hurting  And seh has severe CTS on the right,  Resolved on the left by 2018 EMG nerve conducito nstudies  .  Had MRI neck done in December 01, 2023   Left breast starting to hurt again, ,  Had abx in mid 12-01-2023,  2 rounds.    Constipation  With  fecal incontinence.   But also having very small pellet sized stools . Has not tried any otc remedies for contipation  Arthritis in hands . Ankles hurting,  Back pain     Night sweats and day sweats entire body sweats for several months .   Mammogram normal  30-Nov-2018  Neuropathy.  Foot cramps.   Foot exam normal     Outpatient Medications Prior to Visit  Medication Sig Dispense Refill  . atorvastatin (LIPITOR) 40 MG tablet TAKE ONE TABLET BY MOUTH ONCE DAILY 30 tablet 5  . Continuous Blood Gluc Sensor (FREESTYLE LIBRE 14 DAY SENSOR) MISC 1 each by Does not apply route every 14 (fourteen) days. Change every 2 weeks 2 each 11  . DULoxetine (CYMBALTA) 30 MG capsule Take one 30 mg tablet by mouth once a day for the first week. Then increase to two 30 mg tablets ( total 57m) by mouth once daily. 60 capsule 3  . glucose blood (BAYER CONTOUR TEST) test strip TEST BLOOD SUGARS 3 TIMES DAILY 200 each 11  . insulin NPH Human (NOVOLIN N RELION) 100 UNIT/ML injection Inject 15  units under skin in the AM and 25 units in the evening 30 mL 5  . insulin regular (NOVOLIN R RELION) 100 units/mL injection INJECT UP TO 50 UNITS SUBCUTANEOUSLY ONCE DAILY AS DIRECTED 20 mL 5  . Lancet Devices (EASY TOUCH LANCING DEVICE) MISC Use to check diabetes 4 times daily  E 11.65 1 each 11  . latanoprost (XALATAN) 0.005 % ophthalmic solution INSTILL 1 DROP INTO EACH EYE ONCE DAILY IN THE EVENING    . metFORMIN (GLUCOPHAGE) 1000 MG tablet TAKE ONE TABLET BY MOUTH TWICE DAILY WITH MEALS 60 tablet 5  . NOVOLIN N RELION 100 UNIT/ML injection INJECT 20 UNITS SUBCUTANEOUSLY IN THE MORNING AND 55 IN THE EVENING 3 mL 5  . nystatin (NYSTATIN) powder Apply powder to affected area twice daily until rash has resolved 15 g 3  . omeprazole (PRILOSEC) 40 MG capsule Take 1 capsule (40 mg total) by mouth daily. 30 capsule 3  . traMADol (ULTRAM) 50 MG tablet Take 1 tablet (50 mg total) by mouth daily as needed. 30 tablet 5  . gabapentin (NEURONTIN) 300 MG capsule TAKE 1 CAPSULE BY MOUTH TWICE DAILY 180 capsule 1  . sulfamethoxazole-trimethoprim (BACTRIM DS,SEPTRA DS) 800-160 MG tablet Take 1 tablet by mouth 2 (two) times daily. (Patient not taking: Reported on 12/21/2018) 14 tablet 0   No  facility-administered medications prior to visit.     Review of Systems;  Patient denies headache, fevers, malaise, unintentional weight loss, skin rash, eye pain, sinus congestion and sinus pain, sore throat, dysphagia,  hemoptysis , cough, dyspnea, wheezing, chest pain, palpitations, orthopnea, edema, abdominal pain, nausea, melena, diarrhea, , flank pain, dysuria, hematuria, urinary  Frequency, nocturia, numbness, tingling, seizures,  Focal weakness, Loss of consciousness,  Tremor, insomnia, depression, anxiety, and suicidal ideation.      Objective:  BP 140/82 (BP Location: Right Arm, Patient Position: Sitting, Cuff Size: Normal)   Pulse 81   Temp 97.8 F (36.6 C) (Oral)   Resp 15   Ht 5' 4" (1.626 m)   Wt 190  lb 12.8 oz (86.5 kg)   SpO2 96%   BMI 32.75 kg/m   BP Readings from Last 3 Encounters:  12/21/18 140/82  12/19/18 130/80  11/25/18 130/60    Wt Readings from Last 3 Encounters:  12/21/18 190 lb 12.8 oz (86.5 kg)  12/19/18 191 lb (86.6 kg)  11/25/18 183 lb (83 kg)    General appearance: alert, cooperative and appears stated age Ears: normal TM's and external ear canals both ears Throat: lips, mucosa, and tongue normal; teeth and gums normal Neck: no adenopathy, no carotid bruit, supple, symmetrical, trachea midline and thyroid not enlarged, symmetric, no tenderness/mass/nodules Back: symmetric, no curvature. ROM normal. No CVA tenderness. Lungs: clear to auscultation bilaterally Heart: regular rate and rhythm, S1, S2 normal, no murmur, click, rub or gallop Abdomen: soft, non-tender; bowel sounds normal; no masses,  no organomegaly Pulses: 2+ and symmetric Skin: draining abscess under left breast.  Bilateral healing biopsy sites above each lateral malleoli MSK: no signs of synovitis  Lymph nodes: Cervical, supraclavicular, and axillary nodes normal.  Lab Results  Component Value Date   HGBA1C 7.8 (A) 11/25/2018   HGBA1C 6.8 (A) 07/25/2018   HGBA1C 6.4% 03/03/2018    Lab Results  Component Value Date   CREATININE 0.68 12/21/2018   CREATININE 0.76 12/31/2017   CREATININE 0.64 07/22/2017    Lab Results  Component Value Date   WBC 10.2 12/21/2018   HGB 12.8 12/21/2018   HCT 38.9 12/21/2018   PLT 350 12/21/2018   GLUCOSE 63 (L) 12/21/2018   CHOL 238 (H) 12/21/2018   TRIG 87 12/21/2018   HDL 69 12/21/2018   LDLDIRECT 94.0 09/25/2015   LDLCALC 150 (H) 12/21/2018   ALT 14 12/21/2018   AST 15 12/21/2018   NA 147 (H) 12/21/2018   K 4.2 12/21/2018   CL 108 12/21/2018   CREATININE 0.68 12/21/2018   BUN 14 12/21/2018   CO2 19 (L) 12/21/2018   TSH 1.75 11/09/2016   HGBA1C 7.8 (A) 11/25/2018   MICROALBUR <0.7 06/27/2018    Mm 3d Screen Breast Bilateral  Result  Date: 11/08/2018 CLINICAL DATA:  Screening. EXAM: DIGITAL SCREENING BILATERAL MAMMOGRAM WITH TOMO AND CAD COMPARISON:  Previous exam(s). ACR Breast Density Category b: There are scattered areas of fibroglandular density. FINDINGS: There are no findings suspicious for malignancy. Images were processed with CAD. IMPRESSION: No mammographic evidence of malignancy. A result letter of this screening mammogram will be mailed directly to the patient. RECOMMENDATION: Screening mammogram in one year. (Code:SM-B-01Y) BI-RADS CATEGORY  1: Negative. Electronically Signed   By: Kristopher Oppenheim M.D.   On: 11/08/2018 14:53    Assessment & Plan:   Problem List Items Addressed This Visit    Arthritis   Relevant Medications   gabapentin (NEURONTIN) 300 MG capsule  Boil, breast - Primary    Recurrent,  Despite reported compliance with antibacterial soap use daily and weekly hibiclens. Cephalexin prescribed       Relevant Medications   cephALEXin (KEFLEX) 500 MG capsule   Essential hypertension    Well controlled on current regimen. Renal function stable, no changes today.  Lab Results  Component Value Date   CREATININE 0.68 12/21/2018   Lab Results  Component Value Date   NA 147 (H) 12/21/2018   K 4.2 12/21/2018   CL 108 12/21/2018   CO2 19 (L) 12/21/2018         Polyarthralgia    Chronic, no signs of synovitis on exam.  ESR was ordered but not done.  Will add to next blood draw.   Lab Results  Component Value Date   ESRSEDRATE CANCELED 12/21/2018         Uncontrolled type 2 diabetes mellitus with retinopathy, with long-term current use of insulin ([facility])   Relevant Orders   CBC with Differential/Platelet (Completed)   Comprehensive metabolic panel (Completed)   Lipid Profile (Completed)   Sedimentation rate (Completed)    Other Visit Diagnoses    Iron deficiency       Relevant Orders   Iron, TIBC and Ferritin Panel (Completed)     A total of 25 minutes of face to face time was  spent with patient more than half of which was spent in counselling about the above mentioned conditions  and coordination of care   I have discontinued Briceida C. Lefferts's sulfamethoxazole-trimethoprim. I have also changed her gabapentin. Additionally, I am having her start on cephALEXin. Lastly, I am having her maintain her EASY TOUCH LANCING DEVICE, glucose blood, omeprazole, atorvastatin, DULoxetine, insulin NPH Human, insulin regular, FREESTYLE LIBRE 14 DAY SENSOR, metFORMIN, nystatin, traMADol, NOVOLIN N RELION, and latanoprost.  Meds ordered this encounter  Medications  . cephALEXin (KEFLEX) 500 MG capsule    Sig: Take 1 capsule (500 mg total) by mouth 4 (four) times daily.    Dispense:  28 capsule    Refill:  0  . gabapentin (NEURONTIN) 300 MG capsule    Sig: Take 1 capsule (300 mg total) by mouth 2 (two) times daily.    Dispense:  180 capsule    Refill:  1    Medications Discontinued During This Encounter  Medication Reason  . sulfamethoxazole-trimethoprim (BACTRIM DS,SEPTRA DS) 800-160 MG tablet Completed Course  . gabapentin (NEURONTIN) 300 MG capsule Reorder    Follow-up: Return in about 6 months (around 06/21/2019).   Crecencio Mc, MD

## 2018-12-22 LAB — CBC WITH DIFFERENTIAL/PLATELET
Absolute Monocytes: 510 cells/uL (ref 200–950)
Basophils Absolute: 71 cells/uL (ref 0–200)
Basophils Relative: 0.7 %
EOS ABS: 163 {cells}/uL (ref 15–500)
Eosinophils Relative: 1.6 %
HCT: 38.9 % (ref 35.0–45.0)
Hemoglobin: 12.8 g/dL (ref 11.7–15.5)
Lymphs Abs: 2315 cells/uL (ref 850–3900)
MCH: 29 pg (ref 27.0–33.0)
MCHC: 32.9 g/dL (ref 32.0–36.0)
MCV: 88 fL (ref 80.0–100.0)
MPV: 9.2 fL (ref 7.5–12.5)
Monocytes Relative: 5 %
NEUTROS PCT: 70 %
Neutro Abs: 7140 cells/uL (ref 1500–7800)
Platelets: 350 10*3/uL (ref 140–400)
RBC: 4.42 10*6/uL (ref 3.80–5.10)
RDW: 12.3 % (ref 11.0–15.0)
Total Lymphocyte: 22.7 %
WBC: 10.2 10*3/uL (ref 3.8–10.8)

## 2018-12-22 LAB — LIPID PANEL
Cholesterol: 238 mg/dL — ABNORMAL HIGH (ref <200)
HDL: 69 mg/dL (ref >50)
LDL Cholesterol (Calc): 150 mg/dL (calc) — ABNORMAL HIGH
Non-HDL Cholesterol (Calc): 169 mg/dL (calc) — ABNORMAL HIGH (ref <130)
Total CHOL/HDL Ratio: 3.4 (calc) (ref <5.0)
Triglycerides: 87 mg/dL (ref <150)

## 2018-12-22 LAB — COMPREHENSIVE METABOLIC PANEL
AG Ratio: 1.3 (calc) (ref 1.0–2.5)
ALT: 14 U/L (ref 6–29)
AST: 15 U/L (ref 10–35)
Albumin: 4.3 g/dL (ref 3.6–5.1)
Alkaline phosphatase (APISO): 89 U/L (ref 33–130)
BUN: 14 mg/dL (ref 7–25)
CO2: 19 mmol/L — ABNORMAL LOW (ref 20–32)
CREATININE: 0.68 mg/dL (ref 0.50–1.05)
Calcium: 9.7 mg/dL (ref 8.6–10.4)
Chloride: 108 mmol/L (ref 98–110)
Globulin: 3.4 g/dL (calc) (ref 1.9–3.7)
Glucose, Bld: 63 mg/dL — ABNORMAL LOW (ref 65–99)
Potassium: 4.2 mmol/L (ref 3.5–5.3)
Sodium: 147 mmol/L — ABNORMAL HIGH (ref 135–146)
TOTAL PROTEIN: 7.7 g/dL (ref 6.1–8.1)
Total Bilirubin: 0.4 mg/dL (ref 0.2–1.2)

## 2018-12-22 LAB — IRON,TIBC AND FERRITIN PANEL
%SAT: 12 % (calc) — ABNORMAL LOW (ref 16–45)
Ferritin: 92 ng/mL (ref 16–232)
IRON: 48 ug/dL (ref 45–160)
TIBC: 416 ug/dL (ref 250–450)

## 2018-12-22 LAB — SEDIMENTATION RATE

## 2018-12-22 NOTE — Assessment & Plan Note (Signed)
Recurrent,  Despite reported compliance with antibacterial soap use daily and weekly hibiclens. Cephalexin prescribed

## 2018-12-22 NOTE — Assessment & Plan Note (Addendum)
Chronic, no signs of synovitis on exam.  ESR was ordered but not done.  Will add to next blood draw.   Lab Results  Component Value Date   ESRSEDRATE CANCELED 12/21/2018

## 2018-12-22 NOTE — Assessment & Plan Note (Signed)
Well controlled on current regimen. Renal function stable, no changes today.  Lab Results  Component Value Date   CREATININE 0.68 12/21/2018   Lab Results  Component Value Date   NA 147 (H) 12/21/2018   K 4.2 12/21/2018   CL 108 12/21/2018   CO2 19 (L) 12/21/2018

## 2018-12-23 ENCOUNTER — Other Ambulatory Visit: Payer: Self-pay | Admitting: Internal Medicine

## 2018-12-23 MED ORDER — ATORVASTATIN CALCIUM 40 MG PO TABS: 40.0000 mg | ORAL_TABLET | Freq: Every day | ORAL | 1 refills | Status: DC

## 2019-01-18 ENCOUNTER — Telehealth: Payer: Self-pay | Admitting: *Deleted

## 2019-01-18 NOTE — Telephone Encounter (Signed)
Cramps in bilateral legs more at night when lying in bed , doe snot happen any other time odf day scheduled with Guse NP for tomorrow.

## 2019-01-18 NOTE — Telephone Encounter (Signed)
Copied from [place] [id number]. Topic: General - Other >> Jan 18, 2019 11:00 AM Judyann Munson wrote: Reason for CRM: patient is calling to state she having pain in her legs. She stated it feels like muscle spasm and she is wanting to see if she can have a muscle relaxer called in.

## 2019-01-19 ENCOUNTER — Ambulatory Visit: Payer: BC Managed Care – PPO | Admitting: Family Medicine

## 2019-01-19 ENCOUNTER — Encounter: Payer: Self-pay | Admitting: Family Medicine

## 2019-01-19 VITALS — BP 132/84 | HR 91 | Temp 98.1°F | Resp 16 | Wt 191.0 lb

## 2019-01-19 DIAGNOSIS — R252 Cramp and spasm: Secondary | ICD-10-CM

## 2019-01-19 MED ORDER — TIZANIDINE HCL 2 MG PO TABS: 2.0000 mg | ORAL_TABLET | Freq: Four times a day (QID) | ORAL | 0 refills | Status: DC | PRN

## 2019-01-19 NOTE — Patient Instructions (Signed)
Muscle Cramps and Spasms Muscle cramps and spasms are when muscles tighten by themselves. They usually get better within minutes. Muscle cramps are painful. They are usually stronger and last longer than muscle spasms. Muscle spasms may or may not be painful. They can last a few seconds or much longer. Cramps and spasms can affect any muscle, but they occur most often in the calf muscles of the leg. They are usually not caused by a serious problem. In many cases, the cause is not known. Some common causes include:  Doing more physical work or exercise than your body is ready for.  Using the muscles too much (overuse) by repeating certain movements too many times.  Staying in a certain position for a long time.  Playing a sport or doing an activity without preparing properly.  Using bad form or technique while playing a sport or doing an activity.  Not having enough water in your body (dehydration).  Injury.  Side effects of some medicines.  Low levels of the salts and minerals in your blood (electrolytes), such as low potassium or calcium. Follow these instructions at home: Managing pain and stiffness      Massage, stretch, and relax the muscle. Do this for many minutes at a time.  If told, put heat on tight or tense muscles as often as told by your doctor. Use the heat source that your doctor recommends, such as a moist heat pack or a heating pad. ? Place a towel between your skin and the heat source. ? Leave the heat on for 20-30 minutes. ? Remove the heat if your skin turns bright red. This is very important if you are not able to feel pain, heat, or cold. You may have a greater risk of getting burned.  If told, put ice on the affected area. This may help if you are sore or have pain after a cramp or spasm. ? Put ice in a plastic bag. ? Place a towel between your skin and the bag. ? Leave the ice on for 20 minutes, 2-3 times a day.  Try taking hot showers or baths to help  relax tight muscles. Eating and drinking  Drink enough fluid to keep your pee (urine) pale yellow.  Eat a healthy diet to help ensure that your muscles work well. This should include: ? Fruits and vegetables. ? Lean protein. ? Whole grains. ? Low-fat or nonfat dairy products. General instructions  If you are having cramps often, avoid intense exercise for several days.  Take over-the-counter and prescription medicines only as told by your doctor.  Watch for any changes in your symptoms.  Keep all follow-up visits as told by your doctor. This is important. Contact a doctor if:  Your cramps or spasms get worse or happen more often.  Your cramps or spasms do not get better with time. Summary  Muscle cramps and spasms are when muscles tighten by themselves. They usually get better within minutes.  Cramps and spasms occur most often in the calf muscles of the leg.  Massage, stretch, and relax the muscle. This may help the cramp or spasm go away.  Drink enough fluid to keep your pee (urine) pale yellow. This information is not intended to replace advice given to you by your health care provider. Make sure you discuss any questions you have with your health care provider. Document Released: 10/29/2008 Document Revised: 04/11/2018 Document Reviewed: 04/11/2018 Elsevier Interactive Patient Education  2019 [employer]  

## 2019-01-19 NOTE — Progress Notes (Signed)
Subjective:    Patient ID: [name], female    DOB: 1961/07/03, 58 y.o.   MRN: [id number]  HPI   Patient presents to clinic complaining of muscle cramps in both legs that feel like spasms and cramping, they will have been more so in the evening time or before bed.  States she has tried eating a small amount of mustard when having a muscle cramp and this did help to resolve it somewhat.  She does keep up good water intake throughout the day usually has a water water with her at all times and sips on it throughout the day.  Has a goal to drink at least 50 to 64 ounces of water daily.  Denies any injury or recent falls.  Denies any fever or chills.  Patient Active Problem List   Diagnosis Date Noted  . Chronic pain of multiple sites 06/28/2018  . Urgency incontinence 06/28/2018  . Degenerative arthritis of left knee 04/26/2018  . Carpal tunnel syndrome on both sides 03/29/2018  . Anemia 01/02/2018  . Class 1 obesity with serious comorbidity and body mass index (BMI) of 32.0 to 32.9 in adult 07/23/2017  . Acute pain of left shoulder 05/05/2017  . Left hip pain 05/04/2017  . Carpal tunnel syndrome on right 12/27/2016  . Polyarthralgia 11/09/2016  . Grief reaction 07/24/2016  . Uncontrolled type 2 diabetes mellitus with retinopathy, with long-term current use of insulin ([place]) 07/07/2016  . Visit for preventive health examination 06/21/2016  . Increased urinary frequency 06/21/2016  . Chronic meniscal tear of knee 08/19/2015  . Left knee pain 06/19/2015  . Candidiasis of skin 06/18/2015  . Genital herpes 07/31/2014  . Ulnar nerve compression 09/21/2013  . Noncompliance with diet and medication regimen 11/15/2012  . Boil, breast 08/14/2012  . Tenosynovitis of thumb 01/02/2012  . Arthritis   . Screening for breast cancer 08/04/2011  . Screening for colon cancer 08/04/2011  . HIDRADENITIS SUPPURATIVA 04/11/2007  . GERD 11/17/2006  . ENDOMETRIAL POLYP 11/17/2006  . LOW BACK PAIN  11/17/2006  . FIBROIDS, UTERUS 11/16/2006  . Hyperlipidemia associated with type 2 diabetes mellitus ([place]) 11/16/2006  . ALCOHOL ABUSE, EPISODIC 11/16/2006  . Essential hypertension 11/16/2006   Social History   Tobacco Use  . Smoking status: Former Smoker    Last attempt to quit: 05/09/2012    Years since quitting: 6.7  . Smokeless tobacco: Never Used  Substance Use Topics  . Alcohol use: No    Comment: not for 7 yr   Review of Systems  Constitutional: Negative for chills, fatigue and fever.  HENT: Negative for congestion, ear pain, sinus pain and sore throat.   Eyes: Negative.   Respiratory: Negative for cough, shortness of breath and wheezing.   Cardiovascular: Negative for chest pain, palpitations and leg swelling.  Gastrointestinal: Negative for abdominal pain, diarrhea, nausea and vomiting.  Genitourinary: Negative for dysuria, frequency and urgency.  Musculoskeletal: +muscle cramps in legs. Skin: Negative for color change, pallor and rash.  Neurological: Negative for syncope, light-headedness and headaches.  Psychiatric/Behavioral: The patient is not nervous/anxious.       Objective:   Physical Exam Vitals signs and nursing note reviewed.  Constitutional:      Appearance: She is well-developed.  HENT:     Head: Normocephalic and atraumatic.  Eyes:     General: No scleral icterus.    Extraocular Movements: Extraocular movements intact.     Conjunctiva/sclera: Conjunctivae normal.     Pupils: Pupils are equal,  round, and reactive to light.  Neck:     Musculoskeletal: Normal range of motion and neck supple. No neck rigidity.     Trachea: No tracheal deviation.  Cardiovascular:     Rate and Rhythm: Normal rate and regular rhythm.     Heart sounds: Normal heart sounds.  Pulmonary:     Effort: Pulmonary effort is normal. No respiratory distress.     Breath sounds: Normal breath sounds.  Abdominal:     General: Bowel sounds are normal. There is no distension.      Palpations: Abdomen is soft.     Tenderness: There is no abdominal tenderness.  Musculoskeletal: Normal range of motion.        General: No tenderness or deformity.     Right lower leg: No edema.     Left lower leg: No edema.  Skin:    General: Skin is warm and dry.     Capillary Refill: Capillary refill takes less than 2 seconds.     Coloration: Skin is not jaundiced or pale.     Findings: No erythema.  Neurological:     Mental Status: She is alert and oriented to person, place, and time.     Cranial Nerves: No cranial nerve deficit.     Sensory: No sensory deficit.     Motor: No weakness.     Gait: Gait normal.     Deep Tendon Reflexes: Reflexes normal.  Psychiatric:        Mood and Affect: Mood normal.        Behavior: Behavior normal.    Vitals:   01/19/19 1452 01/19/19 1510  BP: (!) 144/100 132/84  Pulse: 91   Resp: 16   Temp: 98.1 F (36.7 C)   SpO2: 96%       Assessment & Plan:   Muscle cramps-suspect muscle cramps could be related to a possible vitamin or electrolyte deficiency.  We will do lab work to further investigate.  Patient advised to eat a healthy balanced diet, keep up good water intake, do stretches to loosen the muscle and also massage the muscles if they do cramp.  Tizanidine prescribed to use if needed if having a muscle spasm or cramp that is not resolved with water intake, stretching or massage.  Patient will be contacted with results when they are available.  She will otherwise keep regularly scheduled follow-up with PCP as planned and return to clinic sooner if any issues arise.

## 2019-01-20 LAB — CBC
HCT: 38 % (ref 35.0–45.0)
Hemoglobin: 12.8 g/dL (ref 11.7–15.5)
MCH: 29.6 pg (ref 27.0–33.0)
MCHC: 33.7 g/dL (ref 32.0–36.0)
MCV: 88 fL (ref 80.0–100.0)
MPV: 9.1 fL (ref 7.5–12.5)
PLATELETS: 381 10*3/uL (ref 140–400)
RBC: 4.32 10*6/uL (ref 3.80–5.10)
RDW: 11.9 % (ref 11.0–15.0)
WBC: 10.2 10*3/uL (ref 3.8–10.8)

## 2019-01-20 LAB — COMPREHENSIVE METABOLIC PANEL
AG Ratio: 1.6 (calc) (ref 1.0–2.5)
ALKALINE PHOSPHATASE (APISO): 97 U/L (ref 37–153)
ALT: 17 U/L (ref 6–29)
AST: 17 U/L (ref 10–35)
Albumin: 4.2 g/dL (ref 3.6–5.1)
BUN: 17 mg/dL (ref 7–25)
CO2: 25 mmol/L (ref 20–32)
Calcium: 9.6 mg/dL (ref 8.6–10.4)
Chloride: 103 mmol/L (ref 98–110)
Creat: 0.66 mg/dL (ref 0.50–1.05)
GLOBULIN: 2.7 g/dL (ref 1.9–3.7)
Glucose, Bld: 92 mg/dL (ref 65–99)
Potassium: 4.1 mmol/L (ref 3.5–5.3)
Sodium: 139 mmol/L (ref 135–146)
Total Bilirubin: 0.3 mg/dL (ref 0.2–1.2)
Total Protein: 6.9 g/dL (ref 6.1–8.1)

## 2019-01-20 LAB — MAGNESIUM: MAGNESIUM: 1.7 mg/dL (ref 1.5–2.5)

## 2019-01-20 NOTE — Telephone Encounter (Signed)
Seen by lauren

## 2019-01-29 NOTE — Progress Notes (Signed)
[name] [place] [place], [place] [address] Phone: [phone] Subjective:    I [name] am serving as a Education administrator for Dr. Hulan Saas.   I'm seeing this patient by the request  of:    CC: Knee pain follow-up new onset left shoulder pain  [id number]  [name] is a 58 y.o. female coming in with complaint of left shoulder and knee pain. States her knee and shoulder have been painful.   Seen previously for the knee.  Patient has had a peri-meniscal cyst for quite some time.  Was given an injection at last exam.  Minimal improvement this time.  Still having some mild instability in the knee.  Wants to know what the next step would be.  He has been somewhat noncompliant with the home exercises recently but has even gone to formal physical therapy previously.     Past Medical History:  Diagnosis Date  . Arthritis   . Campylobacter enteritis May 2012   [facility] admission  . Carpal tunnel syndrome, bilateral   . Diabetes mellitus   . Gastritis with bleeding due to alcohol 2010   [facility] admission  . GERD (gastroesophageal reflux disease)   . Hyperlipidemia   . Hypertension    no meds  . Wears glasses    Past Surgical History:  Procedure Laterality Date  . CARPAL TUNNEL RELEASE Left 05/14/2014   Procedure: LEFT ULNAR NEUROPLASTY AT ELBOW AND ENDOSCOPIC CARPAL TUNNEL RELEASE;  Surgeon: Jolyn Nap, MD;  Location: [place];  Service: Orthopedics;  Laterality: Left;  . CYST EXCISION  1995   under arms  . endometrial biopsy  2004   benign  . FOOT OSTEOTOMY  2010   right-spurs  . TONSILLECTOMY    . ULNAR NERVE TRANSPOSITION Left 05/14/2014   Procedure: ULNAR NERVE DECOMPRESSION/TRANSPOSITION;  Surgeon: Jolyn Nap, MD;  Location: [place];  Service: Orthopedics;  Laterality: Left;   Social History   Socioeconomic History  . Marital status: Single    Spouse name: Not on file  . Number of  children: Not on file  . Years of education: Not on file  . Highest education level: Not on file  Occupational History  . Not on file  Social Needs  . Financial resource strain: Not on file  . Food insecurity:    Worry: Not on file    Inability: Not on file  . Transportation needs:    Medical: Not on file    Non-medical: Not on file  Tobacco Use  . Smoking status: Former Smoker    Last attempt to quit: 05/09/2012    Years since quitting: 6.7  . Smokeless tobacco: Never Used  Substance and Sexual Activity  . Alcohol use: No    Comment: not for 7 yr  . Drug use: Yes    Types: Marijuana    Comment: sometimes  . Sexual activity: Yes    Comment: same partner x 10 years  Lifestyle  . Physical activity:    Days per week: Not on file    Minutes per session: Not on file  . Stress: Not on file  Relationships  . Social connections:    Talks on phone: Not on file    Gets together: Not on file    Attends religious service: Not on file    Active member of club or organization: Not on file    Attends meetings of clubs or organizations: Not  on file    Relationship status: Not on file  Other Topics Concern  . 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   She lives at home with daughter and grandson.   Allergies  Allergen Reactions  . Oxycodone-Acetaminophen     REACTION: Unknown reaction  . Penicillins Rash   Family History  Problem Relation Age of Onset  . Hypertension Mother   . Stroke Father   . Diabetes Maternal Grandmother   . Hypertension Maternal Grandmother     Current Outpatient Medications (Endocrine & Metabolic):  .  insulin NPH Human (NOVOLIN N RELION) 100 UNIT/ML injection, Inject 15 units under skin in the AM and 25 units in the evening .  insulin regular (NOVOLIN R RELION) 100 units/mL injection, INJECT UP TO 50 UNITS SUBCUTANEOUSLY ONCE DAILY AS DIRECTED .  metFORMIN (GLUCOPHAGE) 1000 MG tablet, TAKE ONE TABLET BY  MOUTH TWICE DAILY WITH MEALS .  NOVOLIN N RELION 100 UNIT/ML injection, INJECT 20 UNITS SUBCUTANEOUSLY IN THE MORNING AND 55 IN THE EVENING  Current Outpatient Medications (Cardiovascular):  .  atorvastatin (LIPITOR) 40 MG tablet, Take 1 tablet (40 mg total) by mouth daily.   Current Outpatient Medications (Analgesics):  .  traMADol (ULTRAM) 50 MG tablet, Take 1 tablet (50 mg total) by mouth daily as needed.   Current Outpatient Medications (Other):  .  cephALEXin (KEFLEX) 500 MG capsule, Take 1 capsule (500 mg total) by mouth 4 (four) times daily. .  Continuous Blood Gluc Sensor (FREESTYLE LIBRE 14 DAY SENSOR) MISC, 1 each by Does not apply route every 14 (fourteen) days. Change every 2 weeks .  DULoxetine (CYMBALTA) 30 MG capsule, Take one 30 mg tablet by mouth once a day for the first week. Then increase to two 30 mg tablets ( total 60mg ) by mouth once daily. Marland Kitchen  gabapentin (NEURONTIN) 300 MG capsule, Take 1 capsule (300 mg total) by mouth 2 (two) times daily. Marland Kitchen  glucose blood (BAYER CONTOUR TEST) test strip, TEST BLOOD SUGARS 3 TIMES DAILY .  Lancet Devices (EASY TOUCH LANCING DEVICE) MISC, Use to check diabetes 4 times daily  E 11.65 .  latanoprost (XALATAN) 0.005 % ophthalmic solution, INSTILL 1 DROP INTO EACH EYE ONCE DAILY IN THE EVENING .  nystatin (NYSTATIN) powder, Apply powder to affected area twice daily until rash has resolved .  omeprazole (PRILOSEC) 40 MG capsule, Take 1 capsule (40 mg total) by mouth daily. Marland Kitchen  tiZANidine (ZANAFLEX) 2 MG tablet, Take 1 tablet (2 mg total) by mouth every 6 (six) hours as needed for muscle spasms.    Past medical history, social, surgical and family history all reviewed in electronic medical record.  No pertanent information unless stated regarding to the chief complaint.   Review of Systems:  No headache, visual changes, nausea, vomiting, diarrhea, constipation, dizziness, abdominal pain, skin rash, fevers, chills, night sweats, weight loss,  swollen lymph nodes, body aches, joint swelling, chest pain, shortness of breath, mood changes.  Positive muscle aches  Objective  Blood pressure 140/82, pulse 85, height 5\' 4"  (1.626 m), weight 190 lb (86.2 kg), SpO2 98 %.    General: No apparent distress alert and oriented x3 mood and affect normal, dressed appropriately.  HEENT: Pupils equal, extraocular movements intact  Respiratory: Patient's speak in full sentences and does not appear short of breath  Cardiovascular: No lower extremity edema, non tender, no erythema  Skin: Warm dry intact with no signs  of infection or rash on extremities or on axial skeleton.  Abdomen: Soft nontender  Neuro: Cranial nerves II through XII are intact, neurovascularly intact in all extremities with 2+ DTRs and 2+ pulses.  Lymph: No lymphadenopathy of posterior or anterior cervical chain or axillae bilaterally.  Gait normal with good balance and coordination.  MSK:  Non tender with full range of motion and good stability and symmetric strength and tone of  elbows, wrist, hip and ankles bilaterally.  Knee: Left Exam shows the patient still has positive McMurray's.  Pain over the medial and lateral joint line.  Mild crepitus noted.  Near full range of motion though.  Left shoulder exam has positive impingement.  5 out of 5 strength of the rotator cuff though noted.  Mild positive crossover test.  After informed written and verbal consent, patient was seated on exam table. Left shoulder was prepped with alcohol swab and utilizing posterior approach, patient's right glenohumeral space was injected with 4:1  marcaine 0.5%: Kenalog 40mg /dL. Patient tolerated the procedure well without immediate complications.   Impression and Recommendations:     This case required medical decision making of moderate complexity. The above documentation has been reviewed and is accurate and complete Lyndal Pulley, DO       Note: This dictation was prepared with Dragon  dictation along with smaller phrase technology. Any transcriptional errors that result from this process are unintentional.

## 2019-01-30 ENCOUNTER — Ambulatory Visit (INDEPENDENT_AMBULATORY_CARE_PROVIDER_SITE_OTHER): Payer: BC Managed Care – PPO | Admitting: Family Medicine

## 2019-01-30 ENCOUNTER — Encounter: Payer: Self-pay | Admitting: Family Medicine

## 2019-01-30 VITALS — BP 140/82 | HR 85 | Ht 64.0 in | Wt 190.0 lb

## 2019-01-30 DIAGNOSIS — M23204 Derangement of unspecified medial meniscus due to old tear or injury, left knee: Secondary | ICD-10-CM | POA: Diagnosis not present

## 2019-01-30 DIAGNOSIS — M7552 Bursitis of left shoulder: Secondary | ICD-10-CM | POA: Diagnosis not present

## 2019-01-30 DIAGNOSIS — M25562 Pain in left knee: Secondary | ICD-10-CM

## 2019-01-30 DIAGNOSIS — G8929 Other chronic pain: Secondary | ICD-10-CM

## 2019-01-30 NOTE — Assessment & Plan Note (Signed)
Patient has had what appeared to be a peri-meniscal cyst.  We have attempted aspiration and injection multiple times over the course of time.  Patient's pain was minimally improved this time.  I encouraged patient to get an MRI at this time.  Has been having this challenging pain intermittently for at least 2 years.  Depending on the MRI we will discuss possible surgical intervention.  Patient has declined the MRI in the past secondary to cost.

## 2019-01-30 NOTE — Assessment & Plan Note (Signed)
Checked it today.  Tolerated the procedure well.  We discussed icing regimen, improve range of motion immediately.  Discussed lifting mechanics.  Patient will follow-up with me again in 4 weeks.  We will consider formal physical therapy if necessary at follow-up.

## 2019-01-30 NOTE — Patient Instructions (Addendum)
Good to see you  I do need an MRI of left knee and call [phone] to schedule. I think it is time.  Injected the left shoulder Exercises 3 times a week.  Keep hands within peripheral vision  See me again in 6 weeks but I will write you sooner on the MRI

## 2019-02-04 ENCOUNTER — Other Ambulatory Visit: Payer: Self-pay | Admitting: Internal Medicine

## 2019-02-12 ENCOUNTER — Ambulatory Visit
Admission: RE | Admit: 2019-02-12 | Discharge: 2019-02-12 | Disposition: A | Discharge status: Home / Self Care | Payer: BC Managed Care – PPO | Source: Ambulatory Visit | Attending: Family Medicine | Admitting: Family Medicine

## 2019-02-12 ENCOUNTER — Other Ambulatory Visit: Payer: Self-pay

## 2019-02-12 DIAGNOSIS — G8929 Other chronic pain: Secondary | ICD-10-CM

## 2019-02-12 DIAGNOSIS — M25562 Pain in left knee: Principal | ICD-10-CM

## 2019-02-13 ENCOUNTER — Other Ambulatory Visit: Payer: Self-pay

## 2019-02-13 DIAGNOSIS — S83207D Unspecified tear of unspecified meniscus, current injury, left knee, subsequent encounter: Secondary | ICD-10-CM

## 2019-02-21 ENCOUNTER — Other Ambulatory Visit: Payer: Self-pay | Admitting: Family Medicine

## 2019-02-21 DIAGNOSIS — R252 Cramp and spasm: Secondary | ICD-10-CM

## 2019-03-12 NOTE — Progress Notes (Signed)
[name] [name], [place] [address] Phone: [phone] Subjective:    Virtual Visit via Video Note  I connected with [name] on 03/12/19 at  2:30 PM EDT by a video enabled telemedicine application and verified that I am speaking with the correct person using two identifiers.   I discussed the limitations of evaluation and management by telemedicine and the availability of in person appointments. The patient expressed understanding and agreed to proceed.  Patient as well as her daughter was on the call.  They were at their house.  I was in my office.    I discussed the assessment and treatment plan with the patient. The patient was provided an opportunity to ask questions and all were answered. The patient agreed with the plan and demonstrated an understanding of the instructions.   The patient was advised to call back or seek an in-person evaluation if the symptoms worsen or if the condition fails to improve as anticipated.  I provided 35 minutes of non-face-to-face time during this encounter.    Wendy Pulley, DO    CC: Knee pain follow-up  [id number]  [name] is a 58 y.o. female coming in with complaint of knee pain.  Patient was found to have severe amount of moderate medial arthritic changes.  Patient also was having pain and sent for an MRI.  MRI was independently visualized by me showing the patient did have a large medial meniscal tear as well as moderate to severe osteoarthritic changes of the knee.     Past Medical History:  Diagnosis Date  . Arthritis   . Campylobacter enteritis May 2012   [facility] admission  . Carpal tunnel syndrome, bilateral   . Diabetes mellitus   . Gastritis with bleeding due to alcohol 2010   [facility] admission  . GERD (gastroesophageal reflux disease)   . Hyperlipidemia   . Hypertension    no meds  . Wears glasses    Past Surgical History:  Procedure Laterality Date  . CARPAL  TUNNEL RELEASE Left 05/14/2014   Procedure: LEFT ULNAR NEUROPLASTY AT ELBOW AND ENDOSCOPIC CARPAL TUNNEL RELEASE;  Surgeon: Jolyn Nap, MD;  Location: [place];  Service: Orthopedics;  Laterality: Left;  . CYST EXCISION  1995   under arms  . endometrial biopsy  2004   benign  . FOOT OSTEOTOMY  2010   right-spurs  . TONSILLECTOMY    . ULNAR NERVE TRANSPOSITION Left 05/14/2014   Procedure: ULNAR NERVE DECOMPRESSION/TRANSPOSITION;  Surgeon: Jolyn Nap, MD;  Location: [place];  Service: Orthopedics;  Laterality: Left;   Social History   Socioeconomic History  . Marital status: Single    Spouse name: Not on file  . Number of children: Not on file  . Years of education: Not on file  . Highest education level: Not on file  Occupational History  . Not on file  Social Needs  . Financial resource strain: Not on file  . Food insecurity:    Worry: Not on file    Inability: Not on file  . Transportation needs:    Medical: Not on file    Non-medical: Not on file  Tobacco Use  . Smoking status: Former Smoker    Last attempt to quit: 05/09/2012    Years since quitting: 6.8  . Smokeless tobacco: Never Used  Substance and Sexual Activity  . Alcohol use: No    Comment: not for  7 yr  . Drug use: Yes    Types: Marijuana    Comment: sometimes  . Sexual activity: Yes    Comment: same partner x 10 years  Lifestyle  . Physical activity:    Days per week: Not on file    Minutes per session: Not on file  . Stress: Not on file  Relationships  . Social connections:    Talks on phone: Not on file    Gets together: Not on file    Attends religious service: Not on file    Active member of club or organization: Not on file    Attends meetings of clubs or organizations: Not on file    Relationship status: Not on file  Other Topics Concern  . 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   She lives at home with  daughter and grandson.   Allergies  Allergen Reactions  . Oxycodone-Acetaminophen     REACTION: Unknown reaction  . Penicillins Rash   Family History  Problem Relation Age of Onset  . Hypertension Mother   . Stroke Father   . Diabetes Maternal Grandmother   . Hypertension Maternal Grandmother     Current Outpatient Medications (Endocrine & Metabolic):  .  insulin NPH Human (NOVOLIN N RELION) 100 UNIT/ML injection, Inject 15 units under skin in the AM and 25 units in the evening .  insulin regular (NOVOLIN R RELION) 100 units/mL injection, INJECT UP TO 50 UNITS SUBCUTANEOUSLY ONCE DAILY AS DIRECTED .  metFORMIN (GLUCOPHAGE) 1000 MG tablet, TAKE ONE TABLET BY MOUTH TWICE DAILY WITH MEALS .  NOVOLIN N RELION 100 UNIT/ML injection, INJECT 20 UNITS SUBCUTANEOUSLY IN THE MORNING AND 55 IN THE EVENING  Current Outpatient Medications (Cardiovascular):  .  atorvastatin (LIPITOR) 40 MG tablet, Take 1 tablet (40 mg total) by mouth daily.   Current Outpatient Medications (Analgesics):  .  traMADol (ULTRAM) 50 MG tablet, Take 1 tablet (50 mg total) by mouth daily as needed.   Current Outpatient Medications (Other):  .  cephALEXin (KEFLEX) 500 MG capsule, Take 1 capsule (500 mg total) by mouth 4 (four) times daily. .  Continuous Blood Gluc Sensor (FREESTYLE LIBRE 14 DAY SENSOR) MISC, 1 each by Does not apply route every 14 (fourteen) days. Change every 2 weeks .  DULoxetine (CYMBALTA) 30 MG capsule, Take one 30 mg tablet by mouth once a day for the first week. Then increase to two 30 mg tablets ( total 60mg ) by mouth once daily. Marland Kitchen  gabapentin (NEURONTIN) 300 MG capsule, Take 1 capsule (300 mg total) by mouth 2 (two) times daily. Marland Kitchen  glucose blood (BAYER CONTOUR TEST) test strip, TEST BLOOD SUGARS 3 TIMES DAILY .  Lancet Devices (EASY TOUCH LANCING DEVICE) MISC, Use to check diabetes 4 times daily  E 11.65 .  latanoprost (XALATAN) 0.005 % ophthalmic solution, INSTILL 1 DROP INTO EACH EYE ONCE  DAILY IN THE EVENING .  nystatin (NYSTATIN) powder, Apply powder to affected area twice daily until rash has resolved .  omeprazole (PRILOSEC) 40 MG capsule, Take 1 capsule by mouth once daily .  tiZANidine (ZANAFLEX) 2 MG tablet, TAKE 1 TABLET BY MOUTH EVERY 6 HOURS AS NEEDED FOR MUSCLE SPASM    Past medical history, social, surgical and family history all reviewed in electronic medical record.  No pertanent information unless stated regarding to the chief complaint.   Review of Systems:  No headache, visual changes, nausea,  vomiting, diarrhea, constipation, dizziness, abdominal pain, skin rash, fevers, chills, night sweats, weight loss, swollen lymph nodes, , chest pain, shortness of breath, mood changes.  Positive muscle aches, joint swelling  Objective     General: No apparent distress alert and oriented x3 mood and affect normal, dressed appropriately.  I was able to see patient on video clearly through her meter.    Impression and Recommendations:     This case required medical decision making of moderate complexity. The above documentation has been reviewed and is accurate and complete Wendy Pulley, DO       Note: This dictation was prepared with Dragon dictation along with smaller phrase technology. Any transcriptional errors that result from this process are unintentional.

## 2019-03-13 ENCOUNTER — Ambulatory Visit (INDEPENDENT_AMBULATORY_CARE_PROVIDER_SITE_OTHER): Payer: BC Managed Care – PPO | Admitting: Family Medicine

## 2019-03-13 ENCOUNTER — Encounter: Payer: Self-pay | Admitting: Family Medicine

## 2019-03-13 DIAGNOSIS — M1712 Unilateral primary osteoarthritis, left knee: Secondary | ICD-10-CM

## 2019-03-13 NOTE — Assessment & Plan Note (Signed)
Moderate to severe.  We discussed with patient in great length.  Discussed home exercises and icing regimen, discussed which activities to do which wants to evaluate.  Given different choices of different treatment options and patient is elected to try Visco supplementation.  We will attempt to get approval.  We discussed the other possibility of surgical intervention for the meniscal tear.  I am concerned the weakness will increase the likelihood the patient needs a knee replacement sooner.  Patient will be considering this.  Discussed icing regimen and home exercises.  Patient will be scheduled once approval for the Visco supplementation and see patient in office.

## 2019-03-17 ENCOUNTER — Telehealth: Payer: Self-pay

## 2019-03-17 NOTE — Telephone Encounter (Signed)
Copied from [place] [id number]. Topic: General - Other >> Mar 17, 2019 10:26 AM Carolyn Stare wrote:  Pt lvm on [facility] appt line, said she was returning a call ,I called pt there was no answer

## 2019-03-20 NOTE — Progress Notes (Signed)
[name] [address], [place] [address] Phone: [phone] Subjective:   I [name] am serving as a Education administrator for Dr. Hulan Saas.  I'm seeing this patient by the request  of:    CC: Knee pain follow-up  XLK:[id number]  [name] is a 58 y.o. female coming in with complaint of left knee pain.  Patient had moderate to severe osteoarthritic changes as well as meniscal tearing.  Patient is here because she is not making any significant improvement.  Continues to have locking as well as grinding.  Pain improved for Visco supplementation injection.  Intermittently wearing the brace. States the knee is painful.        Past Medical History:  Diagnosis Date  . Arthritis   . Campylobacter enteritis May 2012   [facility] admission  . Carpal tunnel syndrome, bilateral   . Diabetes mellitus   . Gastritis with bleeding due to alcohol 2010   [facility] admission  . GERD (gastroesophageal reflux disease)   . Hyperlipidemia   . Hypertension    no meds  . Wears glasses    Past Surgical History:  Procedure Laterality Date  . CARPAL TUNNEL RELEASE Left 05/14/2014   Procedure: LEFT ULNAR NEUROPLASTY AT ELBOW AND ENDOSCOPIC CARPAL TUNNEL RELEASE;  Surgeon: Jolyn Nap, MD;  Location: [place];  Service: Orthopedics;  Laterality: Left;  . CYST EXCISION  1995   under arms  . endometrial biopsy  2004   benign  . FOOT OSTEOTOMY  2010   right-spurs  . TONSILLECTOMY    . ULNAR NERVE TRANSPOSITION Left 05/14/2014   Procedure: ULNAR NERVE DECOMPRESSION/TRANSPOSITION;  Surgeon: Jolyn Nap, MD;  Location: [place];  Service: Orthopedics;  Laterality: Left;   Social History   Socioeconomic History  . Marital status: Single    Spouse name: Not on file  . Number of children: Not on file  . Years of education: Not on file  . Highest education level: Not on file  Occupational History  . Not on file  Social Needs   . Financial resource strain: Not on file  . Food insecurity:    Worry: Not on file    Inability: Not on file  . Transportation needs:    Medical: Not on file    Non-medical: Not on file  Tobacco Use  . Smoking status: Former Smoker    Last attempt to quit: 05/09/2012    Years since quitting: 6.8  . Smokeless tobacco: Never Used  Substance and Sexual Activity  . Alcohol use: No    Comment: not for 7 yr  . Drug use: Yes    Types: Marijuana    Comment: sometimes  . Sexual activity: Yes    Comment: same partner x 10 years  Lifestyle  . Physical activity:    Days per week: Not on file    Minutes per session: Not on file  . Stress: Not on file  Relationships  . Social connections:    Talks on phone: Not on file    Gets together: Not on file    Attends religious service: Not on file    Active member of club or organization: Not on file    Attends meetings of clubs or organizations: Not on file    Relationship status: Not on file  Other Topics Concern  . 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   She lives at home with daughter and grandson.   Allergies  Allergen Reactions  . Oxycodone-Acetaminophen     REACTION: Unknown reaction  . Penicillins Rash   Family History  Problem Relation Age of Onset  . Hypertension Mother   . Stroke Father   . Diabetes Maternal Grandmother   . Hypertension Maternal Grandmother     Current Outpatient Medications (Endocrine & Metabolic):  .  insulin NPH Human (NOVOLIN N RELION) 100 UNIT/ML injection, Inject 15 units under skin in the AM and 25 units in the evening .  insulin regular (NOVOLIN R RELION) 100 units/mL injection, INJECT UP TO 50 UNITS SUBCUTANEOUSLY ONCE DAILY AS DIRECTED .  metFORMIN (GLUCOPHAGE) 1000 MG tablet, TAKE ONE TABLET BY MOUTH TWICE DAILY WITH MEALS .  NOVOLIN N RELION 100 UNIT/ML injection, INJECT 20 UNITS SUBCUTANEOUSLY IN THE MORNING AND 55 IN THE EVENING  Current  Outpatient Medications (Cardiovascular):  .  atorvastatin (LIPITOR) 40 MG tablet, Take 1 tablet (40 mg total) by mouth daily.   Current Outpatient Medications (Analgesics):  .  traMADol (ULTRAM) 50 MG tablet, Take 1 tablet (50 mg total) by mouth every 12 (twelve) hours as needed for up to 5 days.   Current Outpatient Medications (Other):  .  cephALEXin (KEFLEX) 500 MG capsule, Take 1 capsule (500 mg total) by mouth 4 (four) times daily. .  Continuous Blood Gluc Sensor (FREESTYLE LIBRE 14 DAY SENSOR) MISC, 1 each by Does not apply route every 14 (fourteen) days. Change every 2 weeks .  DULoxetine (CYMBALTA) 30 MG capsule, Take one 30 mg tablet by mouth once a day for the first week. Then increase to two 30 mg tablets ( total 60mg ) by mouth once daily. Marland Kitchen  gabapentin (NEURONTIN) 300 MG capsule, Take 1 capsule (300 mg total) by mouth 2 (two) times daily. Marland Kitchen  glucose blood (BAYER CONTOUR TEST) test strip, TEST BLOOD SUGARS 3 TIMES DAILY .  Lancet Devices (EASY TOUCH LANCING DEVICE) MISC, Use to check diabetes 4 times daily  E 11.65 .  latanoprost (XALATAN) 0.005 % ophthalmic solution, INSTILL 1 DROP INTO EACH EYE ONCE DAILY IN THE EVENING .  nystatin (NYSTATIN) powder, Apply powder to affected area twice daily until rash has resolved .  omeprazole (PRILOSEC) 40 MG capsule, Take 1 capsule by mouth once daily .  tiZANidine (ZANAFLEX) 2 MG tablet, TAKE 1 TABLET BY MOUTH EVERY 6 HOURS AS NEEDED FOR MUSCLE SPASM    Past medical history, social, surgical and family history all reviewed in electronic medical record.  No pertanent information unless stated regarding to the chief complaint.   Review of Systems:  No headache, visual changes, nausea, vomiting, diarrhea, constipation, dizziness, abdominal pain, skin rash, fevers, chills, night sweats, weight loss, swollen lymph nodes, body aches, joint swelling, muscle aches, chest pain, shortness of breath, mood changes.   Objective  Blood pressure (!)  144/92, pulse 76, height 5\' 4"  (1.626 m), weight 191 lb (86.6 kg), SpO2 97 %. Systems examined below as of    General: No apparent distress alert and oriented x3 mood and affect normal, dressed appropriately.  HEENT: Pupils equal, extraocular movements intact  Respiratory: Patient's speak in full sentences and does not appear short of breath  Cardiovascular: No lower extremity edema, non tender, no erythema  Skin: Warm dry intact with no signs of infection or rash on extremities or on axial skeleton.  Abdomen: Soft nontender  Neuro: Cranial nerves II through XII are intact, neurovascularly intact in  all extremities with 2+ DTRs and 2+ pulses.  Lymph: No lymphadenopathy of posterior or anterior cervical chain or axillae bilaterally.  Gait normal with good balance and coordination.  MSK:  Non tender with full range of motion and good stability and symmetric strength and tone of shoulders, elbows, wrist, hip, and ankles bilaterally.  Knee: Left valgus deformity noted.  Abnormal thigh to calf ratio.  Tender to palpation over medial and PF joint line.  ROM full in flexion and extension and lower leg rotation. instability with valgus force.  painful patellar compression. Patellar glide with moderate crepitus. Patellar and quadriceps tendons unremarkable. Hamstring and quadriceps strength is normal. Contralateral knee shows minimal arthritic changes but tender to palpation  After informed written and verbal consent, patient was seated on exam table. Left knee was prepped with alcohol swab and utilizing anterolateral approach, patient's left knee space was injected with 48 mg/3 mL of Synvisc (sodium hyaluronate) in a prefilled syringe was injected easily into the knee through a 22-gauge needle.. Patient tolerated the procedure well but did have significant pain with flexion of the knee and weightbearing initially.  5 minutes later seem to be doing better.   Impression and Recommendations:      This case required medical decision making of moderate complexity. The above documentation has been reviewed and is accurate and complete Lyndal Pulley, DO       Note: This dictation was prepared with Dragon dictation along with smaller phrase technology. Any transcriptional errors that result from this process are unintentional.

## 2019-03-21 ENCOUNTER — Other Ambulatory Visit: Payer: Self-pay

## 2019-03-21 ENCOUNTER — Ambulatory Visit: Payer: BC Managed Care – PPO | Admitting: Family Medicine

## 2019-03-21 ENCOUNTER — Encounter: Payer: Self-pay | Admitting: Family Medicine

## 2019-03-21 VITALS — BP 144/92 | HR 76 | Ht 64.0 in | Wt 191.0 lb

## 2019-03-21 DIAGNOSIS — M23204 Derangement of unspecified medial meniscus due to old tear or injury, left knee: Secondary | ICD-10-CM | POA: Diagnosis not present

## 2019-03-21 DIAGNOSIS — M1712 Unilateral primary osteoarthritis, left knee: Secondary | ICD-10-CM | POA: Diagnosis not present

## 2019-03-21 MED ORDER — TRAMADOL HCL 50 MG PO TABS: 50.0000 mg | ORAL_TABLET | Freq: Two times a day (BID) | ORAL | 0 refills | Status: AC | PRN

## 2019-03-21 NOTE — Patient Instructions (Signed)
Good to see you  Will take some weeks to work and I hope it will  Should then last 6-12 months Lets do another virtual visit in 4 weeks to see how you are doing.  Be safe

## 2019-03-21 NOTE — Assessment & Plan Note (Signed)
Patient given injection.  Patient did have significant amount of pain after the injection.  Patient took approximately 5 minutes to be able to move the knee but then was able to ambulate with a mild antalgic gait.  We discussed with patient in great length about this.  We discussed that due to patient also having the degenerative meniscal tear there is a concern that the viscosupplementation could move this.  Patient though does not have any locking and did have full range of motion and was able to ambulate on her own strength.  We discussed that this will take many weeks time improved.  Some short course of tramadol given today.  Patient is not on a chronic pain management contract with primary care provider but is given tramadol intermittently and has worked in the past.

## 2019-03-21 NOTE — Assessment & Plan Note (Signed)
Discussed with patient if this does not work patient will need surgical team.

## 2019-03-22 ENCOUNTER — Telehealth: Payer: Self-pay | Admitting: Emergency Medicine

## 2019-03-22 NOTE — Telephone Encounter (Signed)
Copied from [place] [id number]. Topic: General - Other >> Mar 22, 2019  1:33 PM [name] wrote: Reason for CRM:  pt called to let Dr. Tamala Julian know her knee is feeling ok and she is walking on it and if anything changes she will call back

## 2019-03-27 ENCOUNTER — Encounter: Payer: Self-pay | Admitting: Internal Medicine

## 2019-03-27 ENCOUNTER — Ambulatory Visit (INDEPENDENT_AMBULATORY_CARE_PROVIDER_SITE_OTHER): Payer: BC Managed Care – PPO | Admitting: Internal Medicine

## 2019-03-27 ENCOUNTER — Other Ambulatory Visit: Payer: Self-pay

## 2019-03-27 DIAGNOSIS — E785 Hyperlipidemia, unspecified: Secondary | ICD-10-CM

## 2019-03-27 DIAGNOSIS — Z794 Long term (current) use of insulin: Secondary | ICD-10-CM

## 2019-03-27 DIAGNOSIS — E1165 Type 2 diabetes mellitus with hyperglycemia: Secondary | ICD-10-CM | POA: Diagnosis not present

## 2019-03-27 DIAGNOSIS — E11319 Type 2 diabetes mellitus with unspecified diabetic retinopathy without macular edema: Secondary | ICD-10-CM | POA: Diagnosis not present

## 2019-03-27 DIAGNOSIS — Z6832 Body mass index (BMI) 32.0-32.9, adult: Secondary | ICD-10-CM

## 2019-03-27 DIAGNOSIS — E1169 Type 2 diabetes mellitus with other specified complication: Secondary | ICD-10-CM

## 2019-03-27 DIAGNOSIS — IMO0002 Reserved for concepts with insufficient information to code with codable children: Secondary | ICD-10-CM

## 2019-03-27 DIAGNOSIS — E669 Obesity, unspecified: Secondary | ICD-10-CM

## 2019-03-27 NOTE — Progress Notes (Signed)
Patient ID: [name], female   DOB: Jun 14, 1961, 58 y.o.   MRN: [id number]  Patient location: Home My location: Office  Referring Provider: Crecencio Mc, MD  I connected with the patient on 03/27/19 at  1:49 PM EDT by telephone and verified that I am speaking with the correct person.   I discussed the limitations of evaluation and management by telephone and the availability of in person appointments. The patient expressed understanding and agreed to proceed.   Details of the encounter are shown below.  HPI: [name] is a 58 y.o.-year-old female, returning for f/u for DM2, dx ~2000, insulin-dependent since 2010, uncontrolled, with complications (PN, + DR). Last visit 4 months ago.  She has gel injections in knees.  Last hemoglobin A1c was: Lab Results  Component Value Date   HGBA1C 7.8 (A) 11/25/2018   HGBA1C 6.8 (A) 07/25/2018   HGBA1C 6.4% 03/03/2018   Pt is on: Insulin Before breakfast Before lunch Before dinner  R 10-15 units 10-15 20-25  NPH 15 - 25    Metformin 1000 mg 2x a day.  Pt is checking her sugars 0-1 times a day: - am: 67-147, 167, 196 >> 100-120, 300 >> 38 x1, 140-164 - 2h after b'fast: 154 >> n/c > 203 >> n/c - lunch:   90s >> 80-94 >> 100-118 >> n/c - mid-afternoon:  45x1 , 90-115 >> n/c - before dinner: 135 >> 108-114 >> n/c - 2h after dinner:  216, 219, 451 >> n/c  - bedtime:  140-150, 200s >> 160-180 >> 90-168 >> 119, 143-167, 288   - nighttime: 145-239, 338 >> n/c Lowest sugar was 38 x1, 45 x1 >> 65 >> 67 >> 38 (inj insulin but ate only little); she has hypoglycemia awareness in the 80s. Highest sugar was  263 (banana pudding) >> 203 >> 200 >> 288.  -No CKD, last BUN/creatinine:  Lab Results  Component Value Date   BUN 17 01/19/2019   CREATININE 0.66 01/19/2019   ACR levels normal Lab Results  Component Value Date   MICRALBCREAT 1.8 06/27/2018   MICRALBCREAT 1.6 06/23/2017   MICRALBCREAT 0.7 06/19/2016   MICRALBCREAT 0.4  09/25/2015   MICRALBCREAT 0.3 07/30/2014   MICRALBCREAT 0.5 06/19/2013   MICRALBCREAT 1.5 01/31/2013   MICRALBCREAT 2.6 11/20/2011  She is not on an ACE inhibitor/ARB.  -+ HL; last set of lipids: Lab Results  Component Value Date   CHOL 238 (H) 12/21/2018   HDL 69 12/21/2018   LDLCALC 150 (H) 12/21/2018   LDLDIRECT 94.0 09/25/2015   TRIG 87 12/21/2018   CHOLHDL 3.4 12/21/2018  On Lipitor.  - last eye exam was in 2019: + DR in left eye > right eye.  Also has glaucoma.  She sees Dr. Arnoldo Morale.  -+ Numbness and tingling in her feet. Seeing a podiatrist.  She takes 2 tablets of Neurontin a day.  She also has a history of HTN, episodic alcohol abuse, GERD, low back pain, OA.   ROS: Constitutional: no weight gain/no weight loss, no fatigue, no subjective hyperthermia, no subjective hypothermia Eyes: no blurry vision, no xerophthalmia ENT: no sore throat, no nodules palpated in neck, no dysphagia, no odynophagia, no hoarseness Cardiovascular: no CP/no SOB/no palpitations/no leg swelling Respiratory: no cough/no SOB/no wheezing Gastrointestinal: no N/no V/no D/no C/no acid reflux Musculoskeletal: no muscle aches/+ joint aches Skin: no rashes, no hair loss Neurological: no tremors/+ numbness/+ tingling/no dizziness  I reviewed pt's medications, allergies, PMH, social hx, family hx, and changes  were documented in the history of present illness. Otherwise, unchanged from my initial visit note.  Past Medical History:  Diagnosis Date  . Arthritis   . Campylobacter enteritis May 2012   [facility] admission  . Carpal tunnel syndrome, bilateral   . Diabetes mellitus   . Gastritis with bleeding due to alcohol 2010   [facility] admission  . GERD (gastroesophageal reflux disease)   . Hyperlipidemia   . Hypertension    no meds  . Wears glasses    Past Surgical History:  Procedure Laterality Date  . CARPAL TUNNEL RELEASE Left 05/14/2014   Procedure: LEFT ULNAR NEUROPLASTY AT ELBOW AND ENDOSCOPIC  CARPAL TUNNEL RELEASE;  Surgeon: Jolyn Nap, MD;  Location: [place];  Service: Orthopedics;  Laterality: Left;  . CYST EXCISION  1995   under arms  . endometrial biopsy  2004   benign  . FOOT OSTEOTOMY  2010   right-spurs  . TONSILLECTOMY    . ULNAR NERVE TRANSPOSITION Left 05/14/2014   Procedure: ULNAR NERVE DECOMPRESSION/TRANSPOSITION;  Surgeon: Jolyn Nap, MD;  Location: [place];  Service: Orthopedics;  Laterality: Left;   Social History   Socioeconomic History  . Marital status: Single    Spouse name: Not on file  . Number of children: Not on file  . Years of education: Not on file  . Highest education level: Not on file  Occupational History  . Not on file  Social Needs  . Financial resource strain: Not on file  . Food insecurity:    Worry: Not on file    Inability: Not on file  . Transportation needs:    Medical: Not on file    Non-medical: Not on file  Tobacco Use  . Smoking status: Former Smoker    Last attempt to quit: 05/09/2012    Years since quitting: 6.8  . Smokeless tobacco: Never Used  Substance and Sexual Activity  . Alcohol use: No    Comment: not for 7 yr  . Drug use: Yes    Types: Marijuana    Comment: sometimes  . Sexual activity: Yes    Comment: same partner x 10 years  Lifestyle  . Physical activity:    Days per week: Not on file    Minutes per session: Not on file  . Stress: Not on file  Relationships  . Social connections:    Talks on phone: Not on file    Gets together: Not on file    Attends religious service: Not on file    Active member of club or organization: Not on file    Attends meetings of clubs or organizations: Not on file    Relationship status: Not on file  . Intimate partner violence:    Fear of current or ex partner: Not on file    Emotionally abused: Not on file    Physically abused: Not on file    Forced sexual activity: Not on file  Other Topics Concern  . 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   She lives at home with daughter and grandson.   Current Outpatient Medications on File Prior to Visit  Medication Sig Dispense Refill  . atorvastatin (LIPITOR) 40 MG tablet Take 1 tablet (40 mg total) by mouth daily. 90 tablet 1  . cephALEXin (KEFLEX) 500 MG capsule Take 1 capsule (500 mg total) by mouth 4 (four) times daily. 28 capsule  0  . Continuous Blood Gluc Sensor (FREESTYLE LIBRE 14 DAY SENSOR) MISC 1 each by Does not apply route every 14 (fourteen) days. Change every 2 weeks 2 each 11  . DULoxetine (CYMBALTA) 30 MG capsule Take one 30 mg tablet by mouth once a day for the first week. Then increase to two 30 mg tablets ( total 60mg ) by mouth once daily. 60 capsule 3  . gabapentin (NEURONTIN) 300 MG capsule Take 1 capsule (300 mg total) by mouth 2 (two) times daily. 180 capsule 1  . glucose blood (BAYER CONTOUR TEST) test strip TEST BLOOD SUGARS 3 TIMES DAILY 200 each 11  . insulin NPH Human (NOVOLIN N RELION) 100 UNIT/ML injection Inject 15 units under skin in the AM and 25 units in the evening 30 mL 5  . insulin regular (NOVOLIN R RELION) 100 units/mL injection INJECT UP TO 50 UNITS SUBCUTANEOUSLY ONCE DAILY AS DIRECTED 20 mL 5  . Lancet Devices (EASY TOUCH LANCING DEVICE) MISC Use to check diabetes 4 times daily  E 11.65 1 each 11  . latanoprost (XALATAN) 0.005 % ophthalmic solution INSTILL 1 DROP INTO EACH EYE ONCE DAILY IN THE EVENING    . metFORMIN (GLUCOPHAGE) 1000 MG tablet TAKE ONE TABLET BY MOUTH TWICE DAILY WITH MEALS 60 tablet 5  . NOVOLIN N RELION 100 UNIT/ML injection INJECT 20 UNITS SUBCUTANEOUSLY IN THE MORNING AND 55 IN THE EVENING 3 mL 5  . nystatin (NYSTATIN) powder Apply powder to affected area twice daily until rash has resolved 15 g 3  . omeprazole (PRILOSEC) 40 MG capsule Take 1 capsule by mouth once daily 90 capsule 0  . tiZANidine (ZANAFLEX) 2 MG tablet TAKE 1 TABLET BY MOUTH EVERY 6 HOURS AS  NEEDED FOR MUSCLE SPASM 30 tablet 0  . [DISCONTINUED] esomeprazole (NEXIUM) 40 MG capsule Take 40 mg by mouth daily.     No current facility-administered medications on file prior to visit.    Allergies  Allergen Reactions  . Oxycodone-Acetaminophen     REACTION: Unknown reaction  . Penicillins Rash   Family History  Problem Relation Age of Onset  . Hypertension Mother   . Stroke Father   . Diabetes Maternal Grandmother   . Hypertension Maternal Grandmother     PE: There were no vitals taken for this visit. There is no height or weight on file to calculate BMI. Wt Readings from Last 3 Encounters:  03/21/19 191 lb (86.6 kg)  01/30/19 190 lb (86.2 kg)  01/19/19 191 lb (86.6 kg)   Constitutional:  in NAD  The physical exam was not performed (telephone visit).  ASSESSMENT: 1. DM2, insulin-dependent, uncontrolled, with complications - peripheral neuropathy - DR   2. Obesity class 1 - BMI 32.79  BMI Classification:  < 18.5 underweight   18.5-24.9 normal weight   25.0-29.9 overweight   30.0-34.9 class I obesity   35.0-39.9 class II obesity   ? 40.0 class III obesity   3. HL  PLAN:  1. Patient with longstanding, previously uncontrolled diabetes, but with better control in the last year after she started to take her insulin consistently.  Latest HbA1c was slightly higher, at 6.8%, but we did not change the regimen at that time.  Since then, she started to miss some insulin doses including last night, so this morning her CBG was 300.  We discussed about trying to take the insulin consistently which is a prerequisite for good diabetes control.  We need to avoid getting to the previous situation  in which her diabetes was completely out-of-control with her taking the insulin erratically.  She agree with this and plans to start taking this consistently again.  I do not feel that we need to change her regimen especially now during the holidays but if the sugars remain higher  than target afterwards, may need to increase the doses. -At this visit, sugars are slightly higher in the morning, and I suspect that this is due to eating her dinners later (she just moved earlier to 7 PM) and also possibly snacks after dinner.  We discussed about writing down what this is the sugars to be high in the morning and continue repeating currently her dinners.  I do not feel that we can safely increase her insulin at night for now especially since she had a very low blood (38) sugar after she did not eat much for dinner. - I advised her to: Patient Instructions   Please continue: Insulin Before breakfast Before lunch Before dinner  R 10-15 units 10-15 20-25  NPH 15 - 25    Continue metformin 1000 mg 2x a day   Please come back for a follow-up appointment in 4 months with your log.  -We will check another HbA1c when she returns to the clinic - continue checking sugars at different times of the day - check 3x a day, rotating checks - advised for yearly eye exams >> she is UTD, but needs oxygen - Return to clinic in 4 mo with sugar log      2. Obesity class 1 -She lost 7 pounds before last visit despite the holidays but gained them all back  3. HL - Reviewed latest lipid panel from 11/2018: LDL high Lab Results  Component Value Date   CHOL 238 (H) 12/21/2018   HDL 69 12/21/2018   LDLCALC 150 (H) 12/21/2018   LDLDIRECT 94.0 09/25/2015   TRIG 87 12/21/2018   CHOLHDL 3.4 12/21/2018  - Continues Lipitor without side effects.  - time spent with the patient: 12 min, of which >50% was spent in obtaining information about her symptoms, reviewing her previous labs, evaluations, and treatments, counseling her about her condition (please see the discussed topics above), and developing a plan to further investigate and treat it; she had a number of questions which I addressed.  Philemon Kingdom, MD PhD [facility]

## 2019-03-27 NOTE — Patient Instructions (Addendum)
Please continue: Insulin Before breakfast Before lunch Before dinner  R 10-15 units 10-15 20-25  NPH 15 - 25   Continue metformin 1000 mg 2x a day   Please come back for a follow-up appointment in 4 months with your log.

## 2019-03-28 ENCOUNTER — Other Ambulatory Visit: Payer: Self-pay | Admitting: Internal Medicine

## 2019-04-20 ENCOUNTER — Other Ambulatory Visit: Payer: Self-pay

## 2019-04-20 ENCOUNTER — Ambulatory Visit (INDEPENDENT_AMBULATORY_CARE_PROVIDER_SITE_OTHER): Payer: BC Managed Care – PPO | Admitting: Family Medicine

## 2019-04-20 ENCOUNTER — Encounter: Payer: Self-pay | Admitting: Family Medicine

## 2019-04-20 DIAGNOSIS — M25562 Pain in left knee: Secondary | ICD-10-CM

## 2019-04-20 DIAGNOSIS — M25561 Pain in right knee: Secondary | ICD-10-CM | POA: Diagnosis not present

## 2019-04-20 DIAGNOSIS — G8929 Other chronic pain: Secondary | ICD-10-CM

## 2019-04-20 DIAGNOSIS — M2550 Pain in unspecified joint: Secondary | ICD-10-CM | POA: Diagnosis not present

## 2019-04-20 DIAGNOSIS — M25512 Pain in left shoulder: Secondary | ICD-10-CM

## 2019-04-20 DIAGNOSIS — M25511 Pain in right shoulder: Secondary | ICD-10-CM

## 2019-04-20 MED ORDER — TRAMADOL HCL 50 MG PO TABS: 50.0000 mg | ORAL_TABLET | Freq: Four times a day (QID) | ORAL | 0 refills | Status: AC | PRN

## 2019-04-20 MED ORDER — METHYLPREDNISOLONE 4 MG PO TBPK: ORAL_TABLET | ORAL | 0 refills | Status: DC

## 2019-04-20 NOTE — Progress Notes (Signed)
Patient ID: [name], female   DOB: [birth year], 58 y.o.   MRN: [id number]    Virtual Visit via video Note  This visit type was conducted due to national recommendations for restrictions regarding the COVID-19 pandemic (e.g. social distancing).  This format is felt to be most appropriate for this patient at this time.  All issues noted in this document were discussed and addressed.  No physical exam was performed (except for noted visual exam findings with Video Visits).   I connected with [name] today at  2:00 PM EDT by a video enabled telemedicine application or telephone and verified that I am speaking with the correct person using two identifiers. Location patient: home Location provider: [facility] Persons participating in the virtual visit: patient, provider  I discussed the limitations, risks, security and privacy concerns of performing an evaluation and management service by video and the availability of in person appointments. I also discussed with the patient that there may be a patient responsible charge related to this service. The patient expressed understanding and agreed to proceed.  HPI:  Patient and I connected via video due to complaints of pain in both knees, left worse than right and pain in both shoulders, left worse than right.  Patient states the pain in her shoulders especially is annoying because it radiates into her arms and causes a lot of aching and pain.  Patient has seen sports medicine and orthopedics previously due to her joint pains.  Most recently in April she was given injections in the left knee to help with the pain.  Patient states the injection does not seem to have helped much.  Denies any new injury to shoulders or knees.  No fever or chills.  No cough, shortness of breath or wheezing.  No chest pain.  No GI or GU complaints.  ROS: See pertinent positives and negatives per HPI.  Past Medical History:  Diagnosis Date  . Arthritis   .  Campylobacter enteritis May 2012   [facility] admission  . Carpal tunnel syndrome, bilateral   . Diabetes mellitus   . Gastritis with bleeding due to alcohol 2010   [facility] admission  . GERD (gastroesophageal reflux disease)   . Hyperlipidemia   . Hypertension    no meds  . Wears glasses     Past Surgical History:  Procedure Laterality Date  . CARPAL TUNNEL RELEASE Left 05/14/2014   Procedure: LEFT ULNAR NEUROPLASTY AT ELBOW AND ENDOSCOPIC CARPAL TUNNEL RELEASE;  Surgeon: Jolyn Nap, MD;  Location: [place];  Service: Orthopedics;  Laterality: Left;  . CYST EXCISION  1995   under arms  . endometrial biopsy  2004   benign  . FOOT OSTEOTOMY  2010   right-spurs  . TONSILLECTOMY    . ULNAR NERVE TRANSPOSITION Left 05/14/2014   Procedure: ULNAR NERVE DECOMPRESSION/TRANSPOSITION;  Surgeon: Jolyn Nap, MD;  Location: [place];  Service: Orthopedics;  Laterality: Left;    Family History  Problem Relation Age of Onset  . Hypertension Mother   . Stroke Father   . Diabetes Maternal Grandmother   . Hypertension Maternal Grandmother    Social History   Tobacco Use  . Smoking status: Former Smoker    Last attempt to quit: 05/09/2012    Years since quitting: 6.9  . Smokeless tobacco: Never Used  Substance Use Topics  . Alcohol use: No    Comment: not for 7 yr      Current Outpatient  Medications:  .  atorvastatin (LIPITOR) 40 MG tablet, Take 1 tablet (40 mg total) by mouth daily., Disp: 90 tablet, Rfl: 1 .  cephALEXin (KEFLEX) 500 MG capsule, Take 1 capsule (500 mg total) by mouth 4 (four) times daily., Disp: 28 capsule, Rfl: 0 .  Continuous Blood Gluc Sensor (FREESTYLE LIBRE 14 DAY SENSOR) MISC, 1 each by Does not apply route every 14 (fourteen) days. Change every 2 weeks, Disp: 2 each, Rfl: 11 .  DULoxetine (CYMBALTA) 30 MG capsule, Take one 30 mg tablet by mouth once a day for the first week. Then increase to two 30 mg tablets ( total 60mg ) by  mouth once daily., Disp: 60 capsule, Rfl: 3 .  gabapentin (NEURONTIN) 300 MG capsule, Take 1 capsule (300 mg total) by mouth 2 (two) times daily., Disp: 180 capsule, Rfl: 1 .  glucose blood (BAYER CONTOUR TEST) test strip, TEST BLOOD SUGARS 3 TIMES DAILY, Disp: 200 each, Rfl: 11 .  insulin NPH Human (NOVOLIN N RELION) 100 UNIT/ML injection, Inject 15 units under skin in the AM and 25 units in the evening, Disp: 30 mL, Rfl: 5 .  insulin regular (NOVOLIN R RELION) 100 units/mL injection, INJECT UP TO 50 UNITS SUBCUTANEOUSLY ONCE DAILY AS DIRECTED, Disp: 20 mL, Rfl: 5 .  Lancet Devices (EASY TOUCH LANCING DEVICE) MISC, Use to check diabetes 4 times daily  E 11.65, Disp: 1 each, Rfl: 11 .  latanoprost (XALATAN) 0.005 % ophthalmic solution, INSTILL 1 DROP INTO EACH EYE ONCE DAILY IN THE EVENING, Disp: , Rfl:  .  metFORMIN (GLUCOPHAGE) 1000 MG tablet, TAKE 1 TABLET BY MOUTH TWICE DAILY WITH MEALS, Disp: 120 tablet, Rfl: 0 .  NOVOLIN N RELION 100 UNIT/ML injection, INJECT 20 UNITS SUBCUTANEOUSLY IN THE MORNING AND 55 IN THE EVENING, Disp: 3 mL, Rfl: 5 .  nystatin (NYSTATIN) powder, Apply powder to affected area twice daily until rash has resolved, Disp: 15 g, Rfl: 3 .  omeprazole (PRILOSEC) 40 MG capsule, Take 1 capsule by mouth once daily, Disp: 90 capsule, Rfl: 0 .  tiZANidine (ZANAFLEX) 2 MG tablet, TAKE 1 TABLET BY MOUTH EVERY 6 HOURS AS NEEDED FOR MUSCLE SPASM, Disp: 30 tablet, Rfl: 0 .  methylPREDNISolone (MEDROL DOSEPAK) 4 MG TBPK tablet, Take according to pack instructions, Disp: 21 tablet, Rfl: 0 .  traMADol (ULTRAM) 50 MG tablet, Take 1 tablet (50 mg total) by mouth every 6 (six) hours as needed for up to 5 days., Disp: 20 tablet, Rfl: 0  EXAM:  GENERAL: alert, oriented, appears well and in no acute distress  HEENT: atraumatic, conjunttiva clear, no obvious abnormalities on inspection of external nose and ears  NECK: normal movements of the head and neck  LUNGS: on inspection no signs of  respiratory distress, breathing rate appears normal, no obvious gross SOB, gasping or wheezing  CV: no obvious cyanosis  MS: moves all visible extremities without noticeable abnormality. Able to raise arms up above head without issue, can make full fists and extend out all fingers of hand.   PSYCH/NEURO: pleasant and cooperative, no obvious depression or anxiety, speech and thought processing grossly intact  ASSESSMENT AND PLAN:  Discussed the following assessment and plan:  Polyarthralgia - Plan: methylPREDNISolone (MEDROL DOSEPAK) 4 MG TBPK tablet, traMADol (ULTRAM) 50 MG tablet  Chronic pain of both knees  Chronic pain of both shoulders   Patient is a known history of osteoarthritis in multiple joints and polyarthralgia.  She has seen sports medicine in the past for joint  injections.  Also has seen orthopedics in the past and reports was advised she may need surgery.  We will treat with course of steroid to help reduce inflammation and help reduce pain, also given 20 tablets of tramadol to use as needed for more severe pain.  Advised to do topical rubs such as BenGay or Biofreeze to help reduce pain.  Advised to reach out to her sports medicine doctor and/or orthopedist for further evaluation and management of multiple joint pain.   I discussed the assessment and treatment plan with the patient. The patient was provided an opportunity to ask questions and all were answered. The patient agreed with the plan and demonstrated an understanding of the instructions.   The patient was advised to call back or seek an in-person evaluation if the symptoms worsen or if the condition fails to improve as anticipated.  Jodelle Green, FNP

## 2019-05-19 ENCOUNTER — Encounter: Payer: Self-pay | Admitting: Internal Medicine

## 2019-05-19 ENCOUNTER — Other Ambulatory Visit: Payer: Self-pay

## 2019-05-19 ENCOUNTER — Ambulatory Visit (INDEPENDENT_AMBULATORY_CARE_PROVIDER_SITE_OTHER): Payer: BC Managed Care – PPO | Admitting: Internal Medicine

## 2019-05-19 DIAGNOSIS — I10 Essential (primary) hypertension: Secondary | ICD-10-CM

## 2019-05-19 DIAGNOSIS — G5603 Carpal tunnel syndrome, bilateral upper limbs: Secondary | ICD-10-CM

## 2019-05-19 DIAGNOSIS — M23204 Derangement of unspecified medial meniscus due to old tear or injury, left knee: Secondary | ICD-10-CM

## 2019-05-19 DIAGNOSIS — M1990 Unspecified osteoarthritis, unspecified site: Secondary | ICD-10-CM | POA: Diagnosis not present

## 2019-05-19 DIAGNOSIS — G5601 Carpal tunnel syndrome, right upper limb: Secondary | ICD-10-CM

## 2019-05-19 DIAGNOSIS — S83207S Unspecified tear of unspecified meniscus, current injury, left knee, sequela: Secondary | ICD-10-CM | POA: Diagnosis not present

## 2019-05-19 MED ORDER — GABAPENTIN 100 MG PO CAPS: 100.0000 mg | ORAL_CAPSULE | Freq: Three times a day (TID) | ORAL | 3 refills | Status: DC

## 2019-05-19 MED ORDER — TRAMADOL HCL 50 MG PO TABS: 50.0000 mg | ORAL_TABLET | Freq: Three times a day (TID) | ORAL | 0 refills | Status: AC | PRN

## 2019-05-19 NOTE — Progress Notes (Signed)
Virtual Visit via doxy,me  This visit type was conducted due to national recommendations for restrictions regarding the COVID-19 pandemic (e.g. social distancing).  This format is felt to be most appropriate for this patient at this time.  All issues noted in this document were discussed and addressed.  No physical exam was performed (except for noted visual exam findings with Video Visits).   I connected with@ on 05/19/19 at  9:00 AM EDT by a video enabled telemedicine application and verified that I am speaking with the correct person using two identifiers. Location patient: home Location provider: work or home office Persons participating in the virtual visit: patient, provider  I discussed the limitations, risks, security and privacy concerns of performing an evaluation and management service by telephone and the availability of in person appointments. I also discussed with the patient that there may be a patient responsible charge related to this service. The patient expressed understanding and agreed to proceed.   Reason for visit: BILATERAL ARM PAIN ,,  HADN NUMBNESS   HPI:  58 YR OLD female with history of CTS bilaterally,  S/p CTS on l wrist remotely presents with several pain complaints.  1) both arms aching,  Hands going nub ,  r greater than left  2) shoulder pain bilaterally.  Seeing [facility] who ordered an  MRI cervical spine in  2019 : no significant cervical disk disease;  recommend arthroscopic CTS of right hand.  Having done the left in 2009  .  results not in chart   3)  Hypertension: patient checks blood pressure once a month at best.  Readings have been for the most part < 140/80 at rest . Patient is following a reduce salt diet most days and is taking medications as prescribed  ROS: See pertinent positives and negatives per HPI.  Past Medical History:  Diagnosis Date  . Arthritis   . Campylobacter enteritis May 2012   [facility] admission  . Carpal tunnel  syndrome, bilateral   . Diabetes mellitus   . Gastritis with bleeding due to alcohol 2010   [facility] admission  . GERD (gastroesophageal reflux disease)   . Hyperlipidemia   . Hypertension    no meds  . Wears glasses     Past Surgical History:  Procedure Laterality Date  . CARPAL TUNNEL RELEASE Left 05/14/2014   Procedure: LEFT ULNAR NEUROPLASTY AT ELBOW AND ENDOSCOPIC CARPAL TUNNEL RELEASE;  Surgeon: Jolyn Nap, MD;  Location: [place];  Service: Orthopedics;  Laterality: Left;  . CYST EXCISION  1995   under arms  . endometrial biopsy  2004   benign  . FOOT OSTEOTOMY  2010   right-spurs  . TONSILLECTOMY    . ULNAR NERVE TRANSPOSITION Left 05/14/2014   Procedure: ULNAR NERVE DECOMPRESSION/TRANSPOSITION;  Surgeon: Jolyn Nap, MD;  Location: [place];  Service: Orthopedics;  Laterality: Left;    Family History  Problem Relation Age of Onset  . Hypertension Mother   . Stroke Father   . Diabetes Maternal Grandmother   . Hypertension Maternal Grandmother     SOCIAL HX:  reports that she quit smoking about 7 years ago. She has never used smokeless tobacco. She reports current drug use. Drug: Marijuana. She reports that she does not drink alcohol.   Current Outpatient Medications:  .  atorvastatin (LIPITOR) 40 MG tablet, Take 1 tablet (40 mg total) by mouth daily., Disp: 90 tablet, Rfl: 1 .  Continuous Blood Gluc Sensor (FREESTYLE LIBRE  [place]) MISC, 1 each by Does not apply route every 14 (fourteen) days. Change every 2 weeks, Disp: 2 each, Rfl: 11 .  glucose blood (BAYER CONTOUR TEST) test strip, TEST BLOOD SUGARS 3 TIMES DAILY, Disp: 200 each, Rfl: 11 .  insulin NPH Human (NOVOLIN N RELION) 100 UNIT/ML injection, Inject 15 units under skin in the AM and 25 units in the evening, Disp: 30 mL, Rfl: 5 .  insulin regular (NOVOLIN R RELION) 100 units/mL injection, INJECT UP TO 50 UNITS SUBCUTANEOUSLY ONCE DAILY AS DIRECTED, Disp: 20 mL,  Rfl: 5 .  Lancet Devices (EASY TOUCH LANCING DEVICE) MISC, Use to check diabetes 4 times daily  E 11.65, Disp: 1 each, Rfl: 11 .  latanoprost (XALATAN) 0.005 % ophthalmic solution, INSTILL 1 DROP INTO EACH EYE ONCE DAILY IN THE EVENING, Disp: , Rfl:  .  metFORMIN (GLUCOPHAGE) 1000 MG tablet, TAKE 1 TABLET BY MOUTH TWICE DAILY WITH MEALS, Disp: 120 tablet, Rfl: 0 .  NOVOLIN N RELION 100 UNIT/ML injection, INJECT 20 UNITS SUBCUTANEOUSLY IN THE MORNING AND 55 IN THE EVENING, Disp: 3 mL, Rfl: 5 .  nystatin (NYSTATIN) powder, Apply powder to affected area twice daily until rash has resolved, Disp: 15 g, Rfl: 3 .  omeprazole (PRILOSEC) 40 MG capsule, Take 1 capsule by mouth once daily, Disp: 90 capsule, Rfl: 0 .  tiZANidine (ZANAFLEX) 2 MG tablet, TAKE 1 TABLET BY MOUTH EVERY 6 HOURS AS NEEDED FOR MUSCLE SPASM, Disp: 30 tablet, Rfl: 0 .  gabapentin (NEURONTIN) 100 MG capsule, Take 1 capsule (100 mg total) by mouth 3 (three) times daily., Disp: 90 capsule, Rfl: 3 .  traMADol (ULTRAM) 50 MG tablet, Take 1 tablet (50 mg total) by mouth every 8 (eight) hours as needed for up to 5 days., Disp: 90 tablet, Rfl: 0  EXAM:  VITALS per patient if applicable:  GENERAL: alert, oriented, appears well and in no acute distress  HEENT: atraumatic, conjunttiva clear, no obvious abnormalities on inspection of external nose and ears  NECK: normal movements of the head and neck  LUNGS: on inspection no signs of respiratory distress, breathing rate appears normal, no obvious gross SOB, gasping or wheezing  CV: no obvious cyanosis  MS: moves all visible extremities without noticeable abnormality  PSYCH/NEURO: pleasant and cooperative, no obvious depression or anxiety, speech and thought processing grossly intact  ASSESSMENT AND PLAN:  Discussed the following assessment and plan:    Carpal tunnel syndrome on both sides S/p release on the left remotely by Dr, [facility].  Now in need of CT release on  the right,  Due to progressive disease by last EMG study in 2018 .  she has been offered by Ortho (see OV note under media) in Sept 2019. Referring to neurosurgery per patient request   Carpal tunnel syndrome on right Progressive by last EMG [place] study in 2018.Marland Kitchen  Pain is now constant and hands are numb,  Neurosurgical referral made.     I discussed the assessment and treatment plan with the patient. The patient was provided an opportunity to ask questions and all were answered. The patient agreed with the plan and demonstrated an understanding of the instructions.   The patient was advised to call back or seek an in-person evaluation if the symptoms worsen or if the condition fails to improve as anticipated.  I provided 25 minutes of non-face-to-face time during this encounter.   Crecencio Mc, MD

## 2019-05-19 NOTE — Patient Instructions (Signed)
You need to have surgery done on your right wrist to relieve the carpal tunnel .  I have made the referral to neurosurgery to discuss this   I have refilled the tramadol and gabapentin which you can use for pain contol  Do not resume the cymbalta   You are overdue for diabetes follow up with Dr Renne Crigler

## 2019-05-21 NOTE — Assessment & Plan Note (Signed)
Well controlled on current regimen. Renal function stable, no changes today. 

## 2019-05-21 NOTE — Assessment & Plan Note (Signed)
S/p release on the left remotely by Dr, Orlene [facility].  Now in need of CT release on the right,  Due to progressive disease by last EMG study in 2018 .  she has been offered by Ortho (see OV note under media) in Sept 2019. Referring to neurosurgery per patient request

## 2019-05-21 NOTE — Assessment & Plan Note (Signed)
She has  deferred surgical correction by Cassie Freer

## 2019-05-21 NOTE — Assessment & Plan Note (Signed)
Progressive by last EMG [place] study in 2018.Marland Kitchen  Pain is now constant and hands are numb,  Neurosurgical referral made.

## 2019-05-23 ENCOUNTER — Other Ambulatory Visit: Payer: Self-pay | Admitting: Internal Medicine

## 2019-07-18 ENCOUNTER — Other Ambulatory Visit: Payer: Self-pay | Admitting: Internal Medicine

## 2019-08-08 ENCOUNTER — Telehealth: Payer: Self-pay

## 2019-08-08 NOTE — Telephone Encounter (Signed)
Copied from [place] [id number]. Topic: General - Inquiry >> Aug 08, 2019 12:52 PM Scherrie Gerlach wrote: Reason for CRM: pt called to ask the dr if anything else can be done for the pain in her R arm, shoulder/neck. she thinks from the carpel tunnel. Pt states the pain is so bad she can not function to work. Pt would liek a call back

## 2019-08-08 NOTE — Telephone Encounter (Signed)
Left message for patient to call back  

## 2019-08-09 ENCOUNTER — Encounter: Payer: Self-pay | Admitting: Internal Medicine

## 2019-08-09 ENCOUNTER — Ambulatory Visit (INDEPENDENT_AMBULATORY_CARE_PROVIDER_SITE_OTHER): Payer: BC Managed Care – PPO | Admitting: Internal Medicine

## 2019-08-09 DIAGNOSIS — I10 Essential (primary) hypertension: Secondary | ICD-10-CM

## 2019-08-09 DIAGNOSIS — G8929 Other chronic pain: Secondary | ICD-10-CM | POA: Diagnosis not present

## 2019-08-09 DIAGNOSIS — M542 Cervicalgia: Secondary | ICD-10-CM | POA: Diagnosis not present

## 2019-08-09 MED ORDER — HYDROCODONE-ACETAMINOPHEN 10-325 MG PO TABS: 1.0000 | ORAL_TABLET | Freq: Every day | ORAL | 0 refills | Status: DC

## 2019-08-09 NOTE — Telephone Encounter (Signed)
Spoke with pt and scheduled her for an appt today at 11:30 to discuss with Dr. Derrel Nip.

## 2019-08-09 NOTE — Progress Notes (Signed)
Telephone  Note  This visit type was conducted due to national recommendations for restrictions regarding the COVID-19 pandemic (e.g. social distancing).  This format is felt to be most appropriate for this patient at this time.  All issues noted in this document were discussed and addressed.  No physical exam was performed (except for noted visual exam findings with Video Visits).   I connected with@ on 08/09/19 at 11:30 AM EDT by telephone and verified that I am speaking with the correct person using two identifiers. Location patient: home Location provider: work or home office Persons participating in the virtual visit: patient, provider  I discussed the limitations, risks, security and privacy concerns of performing an evaluation and management service by telephone and the availability of in person appointments. I also discussed with the patient that there may be a patient responsible charge related to this service. The patient expressed understanding and agreed to proceed.  Reason for visit: pain  HPI:  58 yr old female with chronic pain of various joints presents with neck and right arm pain for the past month . Last seen in June for CTS right wrist , referred to Dr Lacinda Axon in June for acute bilateral posterior cervical spine pain with extension to the bilateral trapezius ridge, shoulders and elbows with extension in the radial aspect of the forearm to all of her digits. She describes her right upper extremity has been slightly worse than the left. She feels as if her hands are stiff however she denies of dropping items or difficulty with buttons. She states that she is able to perform normal activities during the day. Her pain began approximately 2 months ago (June 2020) without injury. Her pain is constant and unchanged since onset. She feels that her pain does increase with cervical rotation and movement. Medications include gabapentin 300/300/300 (increased dose causes drowsiness, mild  improvement).  She has a history of carpal tunnel release and ulnar nerve transposition in 2015.  She was treated with a prednisone taper and continued gabapentin and referred to [place] neurology after an MRI of cervical spine was reviewed (aug 2019 [facility]) noted diffuse changes and need for EMG nerve conduction studies were needed to sort out the cause of her current pain complaint.  She has an appointment at the end of the month  Pain is not controlled.  Works during the day,  Using only aleve bid with omeprazole for gastric prophylaxis.  Tramadol use at night making her nauseated .  She adamantly denies any alcohol use in years  Since her hospitalization for alcoholic gastritis in [id number]   Type 2 dm: has not Been seen since dec 2019.  Unaware tht she has an appointment in 3 weeks ( per conversation after visit with Dr. Cruzita Lederer)  HTN: Patient is taking her medications as prescribed and notes no adverse effects.  Home BP readings have been done about once per week and are  generally < 130/80 .  She is avoiding added salt in her diet and walking regularly about 3 times per week for exercise  .  ROS: See pertinent positives and negatives per HPI.  Past Medical History:  Diagnosis Date  . Arthritis   . Campylobacter enteritis May 2012   [facility] admission  . Carpal tunnel syndrome, bilateral   . Diabetes mellitus   . Gastritis with bleeding due to alcohol 2010   [facility] admission  . GERD (gastroesophageal reflux disease)   . Hyperlipidemia   . Hypertension    no meds  .  Wears glasses     Past Surgical History:  Procedure Laterality Date  . CARPAL TUNNEL RELEASE Left 05/14/2014   Procedure: LEFT ULNAR NEUROPLASTY AT ELBOW AND ENDOSCOPIC CARPAL TUNNEL RELEASE;  Surgeon: Jolyn Nap, MD;  Location: [place];  Service: Orthopedics;  Laterality: Left;  . CYST EXCISION  1995   under arms  . endometrial biopsy  2004   benign  . FOOT OSTEOTOMY  2010    right-spurs  . TONSILLECTOMY    . ULNAR NERVE TRANSPOSITION Left 05/14/2014   Procedure: ULNAR NERVE DECOMPRESSION/TRANSPOSITION;  Surgeon: Jolyn Nap, MD;  Location: [place];  Service: Orthopedics;  Laterality: Left;    Family History  Problem Relation Age of Onset  . Hypertension Mother   . Stroke Father   . Diabetes Maternal Grandmother   . Hypertension Maternal Grandmother     SOCIAL HX:  reports that she quit smoking about 7 years ago. She has never used smokeless tobacco. She reports current drug use. Drug: Marijuana. She reports that she does not drink alcohol.   Current Outpatient Medications:  .  atorvastatin (LIPITOR) 40 MG tablet, Take 1 tablet by mouth once daily, Disp: 90 tablet, Rfl: 0 .  Continuous Blood Gluc Sensor (FREESTYLE LIBRE 14 DAY SENSOR) MISC, 1 each by Does not apply route every 14 (fourteen) days. Change every 2 weeks, Disp: 2 each, Rfl: 11 .  gabapentin (NEURONTIN) 100 MG capsule, Take 1 capsule (100 mg total) by mouth 3 (three) times daily., Disp: 90 capsule, Rfl: 3 .  glucose blood (BAYER CONTOUR TEST) test strip, TEST BLOOD SUGARS 3 TIMES DAILY, Disp: 200 each, Rfl: 11 .  insulin NPH Human (NOVOLIN N RELION) 100 UNIT/ML injection, Inject 15 units under skin in the AM and 25 units in the evening, Disp: 30 mL, Rfl: 5 .  insulin regular (NOVOLIN R RELION) 100 units/mL injection, INJECT UP TO 50 UNITS SUBCUTANEOUSLY ONCE DAILY AS DIRECTED, Disp: 20 mL, Rfl: 5 .  Lancet Devices (EASY TOUCH LANCING DEVICE) MISC, Use to check diabetes 4 times daily  E 11.65, Disp: 1 each, Rfl: 11 .  latanoprost (XALATAN) 0.005 % ophthalmic solution, INSTILL 1 DROP INTO EACH EYE ONCE DAILY IN THE EVENING, Disp: , Rfl:  .  metFORMIN (GLUCOPHAGE) 1000 MG tablet, TAKE 1 TABLET BY MOUTH TWICE DAILY WITH MEALS, Disp: 120 tablet, Rfl: 0 .  NOVOLIN N RELION 100 UNIT/ML injection, INJECT 20 UNITS SUBCUTANEOUSLY IN THE MORNING AND 55 IN THE EVENING, Disp: 3 mL, Rfl:  5 .  nystatin (NYSTATIN) powder, Apply powder to affected area twice daily until rash has resolved, Disp: 15 g, Rfl: 3 .  omeprazole (PRILOSEC) 40 MG capsule, Take 1 capsule by mouth once daily, Disp: 90 capsule, Rfl: 3 .  tiZANidine (ZANAFLEX) 2 MG tablet, TAKE 1 TABLET BY MOUTH EVERY 6 HOURS AS NEEDED FOR MUSCLE SPASM, Disp: 30 tablet, Rfl: 0 .  HYDROcodone-acetaminophen (NORCO) 10-325 MG tablet, Take 1-2 tablets by mouth daily. In the evening for severe neck  pain, Disp: 30 tablet, Rfl: 0  EXAM:   General impression: alert, cooperative and articulate.  No signs of being in distress  Lungs: speech is fluent sentence length suggests that patient is not short of breath and not punctuated by cough, sneezing or sniffing. Marland Kitchen   Psych: affect normal.  speech is articulate and non pressured .  Denies suicidal thoughts   ASSESSMENT AND PLAN:  Discussed the following assessment and plan:  Essential hypertension  Chronic midline posterior neck pain  Essential hypertension Well controlled by report on current regimen. Renal function assessment is due, no changes today.  Chronic midline posterior neck pain Trial of Vicodin for management of nocturnal pain .  continue tylenol for daytime pain  management .  Needs to continue PPI if taking aleve . Stressed the importance of getting the EMG /[place] studies via neurology referral to determine if the source of her pain is a peripheral neuropathy or central    I discussed the assessment and treatment plan with the patient. The patient was provided an opportunity to ask questions and all were answered. The patient agreed with the plan and demonstrated an understanding of the instructions.   The patient was advised to call back or seek an in-person evaluation if the symptoms worsen or if the condition fails to improve as anticipated.  I provided 25 minutes of non-face-to-face time during this encounter reviewing prior imaging studies with patient and  couselling on the above conditions .   Crecencio Mc, MD

## 2019-08-09 NOTE — Patient Instructions (Addendum)
  You should  add  2000 mg of acetominophen (tylenol) every day safely  In divided doses (500 mg every 6 hours  Or 1000 mg every 12 hours.) to your current regimen of Aleve   Make sure you take your omeprazole 40 mg every day to protect your stomach.   I have prescribed Vicodin 10/325 to use 1-2 in the evening after you are home from work.    If your nausea does not improve after stopping tramadol,  We must assume the nausea is coming from the aleve.   You MUST keep the neurology appointment so the cause of your pain can be determined (carpal tunnel from the wrist ,  Or referred pain coming from the herniated disks and bone spurring in your neck)  I have contacted Dr. Cruzita Lederer and her office say you have an appointment in 3 weeks.  They will call you to confirm/set up labs and office visit.

## 2019-08-10 NOTE — Assessment & Plan Note (Signed)
Well controlled by report on current regimen. Renal function assessment is due, no changes today.

## 2019-08-10 NOTE — Assessment & Plan Note (Addendum)
Trial of Vicodin for management of nocturnal pain .  continue tylenol for daytime pain  management .  Needs to continue PPI if taking aleve . Stressed the importance of getting the EMG /Elliott studies via neurology referral to determine if the source of her pain is a peripheral neuropathy or central

## 2019-08-17 ENCOUNTER — Telehealth: Payer: Self-pay

## 2019-08-17 MED ORDER — HYDROCODONE-ACETAMINOPHEN 10-325 MG PO TABS: 1.0000 | ORAL_TABLET | Freq: Every day | ORAL | 0 refills | Status: DC

## 2019-08-17 NOTE — Telephone Encounter (Signed)
Copied from [place] [id number]. Topic: General - Other >> Aug 17, 2019 12:42 PM Rainey Pines A wrote: Patient stated that she would like a callback from [facility] in regards to her shoulder pain.

## 2019-08-17 NOTE — Telephone Encounter (Signed)
She is requesting the refill too early   . The prescription was for #30 tablets,  2 daily so she cannot have a refill until sept 24th,  . This problem is under investigation by DR chasnis.  She HAS TO KEEP THE APPOINTMENTS made for her by Dr Sharlet Salina to see Neurologist   for nerve conduction studies , etc and follow up with Chasnis

## 2019-08-17 NOTE — Telephone Encounter (Signed)
Spoke with pt and she stated that she is taking the hydrocodone as prescribed but she is still experiencing pain in her shoulder.

## 2019-08-18 NOTE — Telephone Encounter (Signed)
Ok,  The refill was sent

## 2019-08-20 ENCOUNTER — Encounter (HOSPITAL_COMMUNITY): Payer: Self-pay

## 2019-08-20 ENCOUNTER — Emergency Department (HOSPITAL_COMMUNITY)
Admission: EM | Admit: 2019-08-20 | Discharge: 2019-08-20 | Disposition: A | Discharge status: Home / Self Care | Payer: BC Managed Care – PPO | Attending: Emergency Medicine | Admitting: Emergency Medicine

## 2019-08-20 ENCOUNTER — Other Ambulatory Visit: Payer: Self-pay

## 2019-08-20 ENCOUNTER — Emergency Department (HOSPITAL_COMMUNITY): Payer: BC Managed Care – PPO

## 2019-08-20 DIAGNOSIS — M25511 Pain in right shoulder: Secondary | ICD-10-CM

## 2019-08-20 DIAGNOSIS — E119 Type 2 diabetes mellitus without complications: Secondary | ICD-10-CM | POA: Insufficient documentation

## 2019-08-20 DIAGNOSIS — M5412 Radiculopathy, cervical region: Secondary | ICD-10-CM | POA: Insufficient documentation

## 2019-08-20 DIAGNOSIS — Z794 Long term (current) use of insulin: Secondary | ICD-10-CM | POA: Diagnosis not present

## 2019-08-20 DIAGNOSIS — Z87891 Personal history of nicotine dependence: Secondary | ICD-10-CM | POA: Insufficient documentation

## 2019-08-20 DIAGNOSIS — Z79899 Other long term (current) drug therapy: Secondary | ICD-10-CM | POA: Insufficient documentation

## 2019-08-20 DIAGNOSIS — G8929 Other chronic pain: Secondary | ICD-10-CM | POA: Insufficient documentation

## 2019-08-20 DIAGNOSIS — M25562 Pain in left knee: Secondary | ICD-10-CM | POA: Diagnosis not present

## 2019-08-20 DIAGNOSIS — I10 Essential (primary) hypertension: Secondary | ICD-10-CM | POA: Insufficient documentation

## 2019-08-20 MED ORDER — METHOCARBAMOL 500 MG PO TABS
500.0000 mg | ORAL_TABLET | Freq: Once | ORAL | Status: AC
  Administered 2019-08-20: 500 mg via ORAL
  Filled 2019-08-20: qty 1

## 2019-08-20 MED ORDER — HYDROCODONE-ACETAMINOPHEN 5-325 MG PO TABS
2.0000 | ORAL_TABLET | Freq: Once | ORAL | Status: AC
  Administered 2019-08-20: 11:00:00 2 via ORAL
  Filled 2019-08-20: qty 2

## 2019-08-20 MED ORDER — DICLOFENAC SODIUM 1 % TD GEL
4.0000 g | Freq: Once | TRANSDERMAL | Status: AC
  Administered 2019-08-20: 4 g via TOPICAL
  Filled 2019-08-20: qty 100

## 2019-08-20 NOTE — ED Notes (Signed)
Patient transported to X-ray 

## 2019-08-20 NOTE — ED Provider Notes (Signed)
[facility] DEPT Provider Note   CSN: [id number] Arrival date & time: 08/20/19  [id number]     History   Chief Complaint Chief Complaint  Patient presents with  . Shoulder Pain    Right  . Knee Pain    left    HPI [name] is a 58 y.o. female.     [name] is a 58 y.o. female with a history of hypertension, hyperlipidemia, GERD, diabetes, arthritis, who presents to the ED for evaluation of right shoulder and left knee pain.  Patient reports this chest pain has been going on for months, but became worse this morning, she had difficulty sleeping last night and this caused her to come in for evaluation.  Pain in the right shoulder radiates into the right chest it is worse with range of motion.  She denies any redness or swelling over the shoulder.  Reports she had an MRI of her neck done which showed a radiculopathy.  She denies any numbness or weakness in the arm today.  She also reports several months of pain in her left knee, later reports known meniscus tear.  Reports knee pain is worse with changes in the weather like recently.  Her PCP has previously prescribed hydrocodone for this pain, hydrocodone was not helping with her symptoms today.  She has also tried Tylenol, has been counseled to avoid NSAIDs.  Denies any new injury or trauma to the joints.  No associated shortness of breath, chest pain is not worse with deep breathing or exertion.  No lower extremity swelling.  No other aggravating or alleviating factors.     Past Medical History:  Diagnosis Date  . Arthritis   . Campylobacter enteritis May 2012   [facility] admission  . Carpal tunnel syndrome, bilateral   . Diabetes mellitus   . Gastritis with bleeding due to alcohol 2010   [facility] admission  . GERD (gastroesophageal reflux disease)   . Hyperlipidemia   . Hypertension    no meds  . Wears glasses     Patient Active Problem List   Diagnosis Date Noted  . Acute bursitis of left  shoulder 01/30/2019  . Chronic midline posterior neck pain 06/28/2018  . Urgency incontinence 06/28/2018  . Degenerative joint disease of knee, left 04/26/2018  . Carpal tunnel syndrome on both sides 03/29/2018  . Anemia 01/02/2018  . Class 1 obesity with serious comorbidity and body mass index (BMI) of 32.0 to 32.9 in adult 07/23/2017  . Left hip pain 05/04/2017  . Carpal tunnel syndrome on right 12/27/2016  . Polyarthralgia 11/09/2016  . Uncontrolled type 2 diabetes mellitus with retinopathy, with long-term current use of insulin ([place]) 07/07/2016  . Visit for preventive health examination 06/21/2016  . Increased urinary frequency 06/21/2016  . Chronic meniscal tear of knee 08/19/2015  . Left knee pain 06/19/2015  . Candidiasis of skin 06/18/2015  . Genital herpes 07/31/2014  . Ulnar nerve compression 09/21/2013  . Noncompliance with diet and medication regimen 11/15/2012  . Boil, breast 08/14/2012  . Arthritis   . Screening for breast cancer 08/04/2011  . Screening for colon cancer 08/04/2011  . HIDRADENITIS SUPPURATIVA 04/11/2007  . GERD 11/17/2006  . ENDOMETRIAL POLYP 11/17/2006  . LOW BACK PAIN 11/17/2006  . FIBROIDS, UTERUS 11/16/2006  . Hyperlipidemia associated with type 2 diabetes mellitus ([place]) 11/16/2006  . Essential hypertension 11/16/2006    Past Surgical History:  Procedure Laterality Date  . CARPAL TUNNEL RELEASE Left 05/14/2014  Procedure: LEFT ULNAR NEUROPLASTY AT ELBOW AND ENDOSCOPIC CARPAL TUNNEL RELEASE;  Surgeon: Jolyn Nap, MD;  Location: [place];  Service: Orthopedics;  Laterality: Left;  . CYST EXCISION  1995   under arms  . endometrial biopsy  2004   benign  . FOOT OSTEOTOMY  2010   right-spurs  . TONSILLECTOMY    . ULNAR NERVE TRANSPOSITION Left 05/14/2014   Procedure: ULNAR NERVE DECOMPRESSION/TRANSPOSITION;  Surgeon: Jolyn Nap, MD;  Location: [place];  Service: Orthopedics;  Laterality: Left;      OB History   No obstetric history on file.      Home Medications    Prior to Admission medications   Medication Sig Start Date End Date Taking? Authorizing Provider  atorvastatin (LIPITOR) 40 MG tablet Take 1 tablet by mouth once daily 07/21/19   Crecencio Mc, MD  Continuous Blood Gluc Sensor (FREESTYLE LIBRE 14 DAY SENSOR) MISC 1 each by Does not apply route every 14 (fourteen) days. Change every 2 weeks 11/01/17   Philemon Kingdom, MD  gabapentin (NEURONTIN) 100 MG capsule Take 1 capsule (100 mg total) by mouth 3 (three) times daily. 05/19/19   Crecencio Mc, MD  glucose blood (BAYER CONTOUR TEST) test strip TEST BLOOD SUGARS 3 TIMES DAILY 08/12/16   Philemon Kingdom, MD  HYDROcodone-acetaminophen [facility]) 10-325 MG tablet Take 1-2 tablets by mouth daily. In the evening for severe neck  pain 08/24/19   Crecencio Mc, MD  insulin NPH Human (NOVOLIN N RELION) 100 UNIT/ML injection Inject 15 units under skin in the AM and 25 units in the evening 11/01/17   Philemon Kingdom, MD  insulin regular (NOVOLIN R RELION) 100 units/mL injection INJECT UP TO 50 UNITS SUBCUTANEOUSLY ONCE DAILY AS DIRECTED 11/01/17   Philemon Kingdom, MD  Lancet Devices (EASY TOUCH LANCING DEVICE) MISC Use to check diabetes 4 times daily  E 11.65 08/12/16   Philemon Kingdom, MD  latanoprost (XALATAN) 0.005 % ophthalmic solution INSTILL 1 DROP INTO EACH EYE ONCE DAILY IN THE EVENING 12/02/18   [provider]  metFORMIN (GLUCOPHAGE) 1000 MG tablet TAKE 1 TABLET BY MOUTH TWICE DAILY WITH MEALS 03/28/19   Crecencio Mc, MD  NOVOLIN N RELION 100 UNIT/ML injection INJECT 20 UNITS SUBCUTANEOUSLY IN THE MORNING AND 55 IN THE EVENING 11/07/18   McLean-Scocuzza, Nino Glow, MD  nystatin (NYSTATIN) powder Apply powder to affected area twice daily until rash has resolved 03/28/18   Crecencio Mc, MD  omeprazole (PRILOSEC) 40 MG capsule Take 1 capsule by mouth once daily 05/23/19   Crecencio Mc, MD  tiZANidine (ZANAFLEX) 2  MG tablet TAKE 1 TABLET BY MOUTH EVERY 6 HOURS AS NEEDED FOR MUSCLE SPASM 02/21/19   Guse, Jacquelynn Cree, FNP  esomeprazole (NEXIUM) 40 MG capsule Take 40 mg by mouth daily. 08/04/11 11/15/12  Crecencio Mc, MD    Family History Family History  Problem Relation Age of Onset  . Hypertension Mother   . Stroke Father   . Diabetes Maternal Grandmother   . Hypertension Maternal Grandmother     Social History Social History   Tobacco Use  . Smoking status: Former Smoker    Quit date: 05/09/2012    Years since quitting: 7.2  . Smokeless tobacco: Never Used  Substance Use Topics  . Alcohol use: No    Comment: not for 7 yr  . Drug use: Yes    Types: Marijuana    Comment: sometimes  Allergies   Oxycodone-acetaminophen and Penicillins   Review of Systems Review of Systems  Constitutional: Negative for chills and fever.  Respiratory: Negative for cough and shortness of breath.   Cardiovascular: Positive for chest pain.  Gastrointestinal: Negative for abdominal pain.  Musculoskeletal: Positive for arthralgias and neck pain. Negative for joint swelling.  Skin: Negative for color change and rash.  Neurological: Negative for weakness and numbness.     Physical Exam Updated Vital Signs BP 116/77   Pulse 95   Temp 98.1 F (36.7 C) (Oral)   Resp (!) 23   Ht 5\' 3"  (1.6 m)   Wt 86.2 kg   SpO2 100%   BMI 33.66 kg/m   Physical Exam Vitals signs and nursing note reviewed.  Constitutional:      General: She is not in acute distress.    Appearance: Normal appearance. She is well-developed. She is not ill-appearing or diaphoretic.  HENT:     Head: Normocephalic and atraumatic.  Eyes:     General:        Right eye: No discharge.        Left eye: No discharge.  Cardiovascular:     Rate and Rhythm: Normal rate and regular rhythm.     Pulses: Normal pulses.     Heart sounds: Normal heart sounds. No murmur. No gallop.   Pulmonary:     Effort: Pulmonary effort is normal. No  respiratory distress.     Comments: Respirations equal and unlabored, patient able to speak in full sentences, lungs clear to auscultation bilaterally, there is some tenderness over the right upper chest wall, no overlying skin changes or palpable deformity Musculoskeletal:        General: No deformity.     Comments: Tenderness over the right cervical paraspinal extending into the right trapezius and shoulder, worse with ROM of the shoulder, but full passive ROM, normal strength and sensation, 2+ radial pulse There is also tenderness over the anterior joint line of the left knee, no swelling or effusion, no erythema or warmth, full ROM. No calf pain, normal sensation and strength, distal pulse 2+  Skin:    General: Skin is warm and dry.  Neurological:     Mental Status: She is alert and oriented to person, place, and time.     Coordination: Coordination normal.  Psychiatric:        Mood and Affect: Mood normal.        Behavior: Behavior normal.      ED Treatments / Results  Labs (all labs ordered are listed, but only abnormal results are displayed) Labs Reviewed - No data to display  EKG EKG Interpretation  Date/Time:  Sunday August 20 2019 11:59:44 EDT Ventricular Rate:  97 PR Interval:  120 QRS Duration: 80 QT Interval:  354 QTC Calculation: 449 R Axis:   32 Text Interpretation:  Normal sinus rhythm Normal ECG Rate faster Otherwise no significant change Confirmed by Deno Etienne [phone]) on 08/21/2019 8:57:27 AM   Radiology Dg Shoulder Right  Result Date: 08/20/2019 CLINICAL DATA:  Shoulder pain EXAM: RIGHT SHOULDER - 2+ VIEW COMPARISON:  None. FINDINGS: No fracture or dislocation is seen. Mild degenerative changes of the glenohumeral joint. Visualized soft tissues are within normal limits. Visualized right lung is clear. IMPRESSION: Negative. Electronically Signed   By: Julian Hy M.D.   On: 08/20/2019 11:01   Dg Knee Complete 4 Views Left  Result Date: 08/20/2019  CLINICAL DATA:  Left knee pain EXAM: LEFT KNEE -  COMPLETE 4+ VIEW COMPARISON:  None. FINDINGS: No fracture or dislocation is seen. Mild narrowing of the medial compartment with small patellofemoral osteophytes. No definite suprapatellar knee joint effusion. IMPRESSION: Negative. Electronically Signed   By: Julian Hy M.D.   On: 08/20/2019 11:01    Procedures Procedures (including critical care time)  Medications Ordered in ED Medications  diclofenac sodium (VOLTAREN) 1 % transdermal gel 4 g (4 g Topical Given 08/20/19 1105)  HYDROcodone-acetaminophen (NORCO/VICODIN) 5-325 MG per tablet 2 tablet (2 tablets Oral Given 08/20/19 1105)  methocarbamol (ROBAXIN) tablet 500 mg (500 mg Oral Given 08/20/19 1105)     Initial Impression / Assessment and Plan / ED Course  I have reviewed the triage vital signs and the nursing notes.  Pertinent labs & imaging results that were available during my care of the patient were reviewed by me and considered in my medical decision making (see chart for details).  58 year old female presents with right shoulder pain and left knee pain.  Pain has been present for multiple months but patient presents today due to worsening pain unable to tolerate at home.  She has been seeing her PCP as well as specialists for the symptoms, has been diagnosed with cervical radiculopathy as well as a left meniscus tear.  X-rays today show arthritis with no acute changes.  Given pain radiating into the chest EKG checked, with no ischemic changes.  I have long discussion with patient regarding pain, and that given that this pain is chronic in nature I do not expect to resolve it completely today, her pain is improved with treatment here in the ED.  I stressed the importance of continuing to follow-up outpatient with PCP and specialists, she will need to follow-up with them for continued pain management.  She expresses understanding and agreement with plan.  Return precautions discussed.   Final Clinical Impressions(s) / ED Diagnoses   Final diagnoses:  Chronic right shoulder pain  Cervical radiculopathy  Left knee pain, unspecified chronicity    ED Discharge Orders    None       Janet Berlin 08/25/19 Benjie Karvonen, MD 08/31/19 1227

## 2019-08-20 NOTE — ED Triage Notes (Addendum)
Pt c/o of R shoulder, R neck pain, L knee pain, and chest pain for "months".

## 2019-08-20 NOTE — Discharge Instructions (Signed)
The pain you have been having is related to multiple factors including your cervical radiculopathy, and arthritis, you also have arthritis in your knee as well as a known tear in your meniscus all these things can be causing worsening pain, given that this pain has been going on for several months going to take time to improve the symptoms.  You need to continue following up with your primary care doctor, your neurologist and I also think you would benefit from seeing a orthopedist for the arthritis.  You will need to continue discussing pain medication with your primary doctor.  You can use the Voltaren gel prescribed to you today topically over your knee shoulder and neck to help improve pain and inflammation.  Return to the ED if you have fevers, joints become red hot or swollen, you have chest pain, shortness of breath or any other new or concerning symptoms.

## 2019-08-21 ENCOUNTER — Telehealth: Payer: Self-pay | Admitting: *Deleted

## 2019-08-21 NOTE — Telephone Encounter (Signed)
Patient notified to contact Dr. Ninfa Linden & I provided her to number to his office.

## 2019-08-21 NOTE — Telephone Encounter (Signed)
Copied from [place] [id number]. Topic: General - Other >> Aug 21, 2019  8:58 AM Rainey Pines A wrote: Patient called to inform Dr. Derrel Nip she went to hospital yesterday and they were told that she has a pinched nerve in arm. Patient would like a callback from nurse in regards to if she should contact specialist Dr. Lowry Ram

## 2019-08-21 NOTE — Telephone Encounter (Signed)
Yes if that's who the ER doc told her to see,  I believe he sent the ER note to Dr. Ninfa Linden

## 2019-08-23 ENCOUNTER — Ambulatory Visit (INDEPENDENT_AMBULATORY_CARE_PROVIDER_SITE_OTHER): Payer: BC Managed Care – PPO | Admitting: Physician Assistant

## 2019-08-23 ENCOUNTER — Ambulatory Visit: Payer: Self-pay

## 2019-08-23 ENCOUNTER — Encounter: Payer: Self-pay | Admitting: Physician Assistant

## 2019-08-23 DIAGNOSIS — M542 Cervicalgia: Secondary | ICD-10-CM | POA: Diagnosis not present

## 2019-08-23 DIAGNOSIS — R252 Cramp and spasm: Secondary | ICD-10-CM | POA: Diagnosis not present

## 2019-08-23 MED ORDER — TIZANIDINE HCL 2 MG PO TABS: ORAL_TABLET | ORAL | 0 refills | Status: DC

## 2019-08-23 NOTE — Progress Notes (Signed)
HPI: [name] comes in today due to right shoulder and right upper extremity pain.  She is seen in the ER on 08/20/2019 was sent here due to shoulder pain and cervical pain.  She states she is had pain that shooting down her arm from her neck to her wrist some numbness.  States pains have been ongoing for at least last month.  In talking with her she states that she had an MRI done at prenatal clinic and sees a Dr. Lacinda Axon whose neurosurgeon there.  She does not know the results of the MRI.  She has an EMG nerve conduction study planned for this coming Tuesday with Dr. Mina Marble at [facility].  She is been on a Medrol Dosepak which did not help with her pain.  She is also taking gabapentin.  She is taken Zanaflex in the past.  Patient is quite tearful whenever I walk him but does calm down.  She is unsure if this pain is coming from carpal tunnel which she has been told she has good as it does seem similar to the carpal tunnel symptoms she had on the left.  Review of systems: No fevers or chills.  Physical exam General well-developed well-nourished anxious female. Psych alert and oriented x3.  Cervical spine she has pain with range of motion cervical spine.  Tenderness in the trapezius region particularly on the right.  No tenderness over the cervical spine itself.  Negative Spurling's.  Positive Tinel's over the right median nerve at the wrist.  Positive compression test on the right over the median nerve at the wrist.  Negative compression and Tinel's over the median nerve at the wrist on the left.  Subjective decreased sensation right hand compared to left.  Radial pulses are 2+ bilaterally equal symmetric.  Radiographs: Cervical spine 2 views: No acute fractures no spondylolisthesis.  Loss of lordotic curvature.  Impression: Right upper extremity radicular pain  Plan: Due to the fact the patient does have a neurosurgeon and has a appointment with Dr. Mina Marble at [facility] orthopedics coming Tuesday for  reported EMG nerve conduction studies did not recommend physical therapy or further studies.  Did refill her Zanaflex.  She is follow-up Dr. Mina Marble this coming Tuesday.  Discussed with patient that I was unable to find her MRI of her cervical spine on care everywhere or any notation or report of the cervical spine and therefore she should follow-up with Dr. Lacinda Axon and let him know that she is having increasing radicular symptoms down her right arm.

## 2019-08-29 ENCOUNTER — Ambulatory Visit: Payer: BC Managed Care – PPO | Admitting: Neurology

## 2019-08-29 ENCOUNTER — Encounter: Payer: Self-pay | Admitting: Neurology

## 2019-08-29 ENCOUNTER — Other Ambulatory Visit: Payer: Self-pay

## 2019-08-29 VITALS — BP 132/72 | HR 98 | Temp 97.3°F | Ht 63.0 in | Wt 192.0 lb

## 2019-08-29 DIAGNOSIS — M5412 Radiculopathy, cervical region: Secondary | ICD-10-CM | POA: Diagnosis not present

## 2019-08-29 DIAGNOSIS — M4692 Unspecified inflammatory spondylopathy, cervical region: Secondary | ICD-10-CM | POA: Insufficient documentation

## 2019-08-29 MED ORDER — GABAPENTIN 300 MG PO CAPS: 300.0000 mg | ORAL_CAPSULE | Freq: Three times a day (TID) | ORAL | 3 refills | Status: DC

## 2019-08-29 NOTE — Progress Notes (Signed)
PATIENT: [name] DOB: 12-Jun-1961  Chief Complaint  Patient presents with  . Cervical Pain    She is here with her daughter, [name].  Reports a gradual worsening of neck pain that radiates down right arm and into all five fingers. She tried gabapentin 300mg  TID but stopped it because it was not helpful.  Hydrocodone has only provided temporary, mild relief.  Tizanidine is not working.   Marland Kitchen PCP    Crecencio Mc, MD  . Physical Medicine and Rehabilitation    Normajean Glasgow, MD - referring provider     HISTORICAL  [name] is a 58 year old female, seen in request by her pain management doctor Mina Marble for evaluation of right-sided neck pain, radiating pain to right arm, her primary care physician is Dr. Derrel Nip, Aris Everts, initial evaluation was on August 29, 2019.  I have reviewed and summarized the referring note from the referring physician.  She had a past medical history of hypertension, hyperlipidemia, diabetes.  She works as a Retail buyer.  She complains of right-sided neck pain since August 2020, radiating pain to right shoulder right upper extremity, numbness of all 5 fingers on her right hand, mildly weak grip on the right side.  She denies left upper extremity involvement, denies gait abnormality.  MRI of cervical spine from [facility] orthopedic specialist on July 22, 2018, multilevel degenerative changes, unremarkable C2 and 3, C3 and 4 diffuse disc bulging with bilateral uncovertebral hypertrophy, mild canal stenosis, mild left-sided foraminal stenosis.  C4-5, diffuse disc bulging with uncovertebral hypertrophy, mild central stenosis with mild bilateral foraminal stenosis.  C5-6: Broad-based disc bulging with mild central stenosis.  Mild bilateral foraminal stenosis.  C6 and 7, disc bulging without central or foraminal stenosis.  C7 and T1 is unremarkable.  She complains such severe pain of her right neck, despite taking gabapentin 100 mg 3 times daily, hydrocodone as  needed.  Labs Jan 2020: LDL 150, cholesterol 238, normal CBC, A1c 7.8.   REVIEW OF SYSTEMS: Full 14 system review of systems performed and notable only for as above All other review of systems were negative.  ALLERGIES: Allergies  Allergen Reactions  . Oxycodone-Acetaminophen Other (See Comments)    Causes her to feel "hot".  . Penicillins Rash    HOME MEDICATIONS: Current Outpatient Medications  Medication Sig Dispense Refill  . atorvastatin (LIPITOR) 40 MG tablet Take 1 tablet by mouth once daily 90 tablet 0  . Continuous Blood Gluc Sensor (FREESTYLE LIBRE 14 DAY SENSOR) MISC 1 each by Does not apply route every 14 (fourteen) days. Change every 2 weeks 2 each 11  . gabapentin (NEURONTIN) 100 MG capsule Take 1 capsule (100 mg total) by mouth 3 (three) times daily. 90 capsule 3  . glucose blood (BAYER CONTOUR TEST) test strip TEST BLOOD SUGARS 3 TIMES DAILY 200 each 11  . HYDROcodone-acetaminophen (NORCO) 10-325 MG tablet Take 1-2 tablets by mouth daily. In the evening for severe neck  pain 60 tablet 0  . insulin NPH Human (NOVOLIN N RELION) 100 UNIT/ML injection Inject 15 units under skin in the AM and 25 units in the evening 30 mL 5  . Lancet Devices (EASY TOUCH LANCING DEVICE) MISC Use to check diabetes 4 times daily  E 11.65 1 each 11  . latanoprost (XALATAN) 0.005 % ophthalmic solution INSTILL 1 DROP INTO EACH EYE ONCE DAILY IN THE EVENING    . metFORMIN (GLUCOPHAGE) 1000 MG tablet TAKE 1 TABLET BY MOUTH TWICE DAILY WITH MEALS  120 tablet 0  . NOVOLIN N RELION 100 UNIT/ML injection INJECT 20 UNITS SUBCUTANEOUSLY IN THE MORNING AND 55 IN THE EVENING 3 mL 5  . nystatin (NYSTATIN) powder Apply powder to affected area twice daily until rash has resolved 15 g 3  . omeprazole (PRILOSEC) 40 MG capsule Take 1 capsule by mouth once daily 90 capsule 3  . tiZANidine (ZANAFLEX) 2 MG tablet TAKE 1 TABLET BY MOUTH EVERY 6 HOURS AS NEEDED FOR MUSCLE SPASM 30 tablet 0   No current  facility-administered medications for this visit.     PAST MEDICAL HISTORY: Past Medical History:  Diagnosis Date  . Arthritis   . Campylobacter enteritis May 2012   [facility] admission  . Carpal tunnel syndrome, bilateral   . Diabetes mellitus   . Gastritis with bleeding due to alcohol 2010   [facility] admission  . GERD (gastroesophageal reflux disease)   . Hyperlipidemia   . Hypertension    no meds  . Neck pain   . Wears glasses     PAST SURGICAL HISTORY: Past Surgical History:  Procedure Laterality Date  . CARPAL TUNNEL RELEASE Left 05/14/2014   Procedure: LEFT ULNAR NEUROPLASTY AT ELBOW AND ENDOSCOPIC CARPAL TUNNEL RELEASE;  Surgeon: Jolyn Nap, MD;  Location: [place];  Service: Orthopedics;  Laterality: Left;  . CYST EXCISION  1995   under arms  . endometrial biopsy  2004   benign  . FOOT OSTEOTOMY  2010   right-spurs  . TONSILLECTOMY    . ULNAR NERVE TRANSPOSITION Left 05/14/2014   Procedure: ULNAR NERVE DECOMPRESSION/TRANSPOSITION;  Surgeon: Jolyn Nap, MD;  Location: [place];  Service: Orthopedics;  Laterality: Left;    FAMILY HISTORY: Family History  Problem Relation Age of Onset  . Hypertension Mother   . Stroke Father   . Diabetes Maternal Grandmother   . Hypertension Maternal Grandmother     SOCIAL HISTORY: Social History   Socioeconomic History  . Marital status: Single    Spouse name: Not on file  . Number of children: 2  . Years of education: 47  . Highest education level: High school graduate  Occupational History  . Not on file  Social Needs  . Financial resource strain: Not on file  . Food insecurity    Worry: Not on file    Inability: Not on file  . Transportation needs    Medical: Not on file    Non-medical: Not on file  Tobacco Use  . Smoking status: Former Smoker    Quit date: 05/09/2012    Years since quitting: 7.3  . Smokeless tobacco: Never Used  Substance and Sexual Activity  . Alcohol  use: Not Currently  . Drug use: Yes    Types: Marijuana    Comment: sometimes  . Sexual activity: Yes    Comment: same partner x 10 years  Lifestyle  . Physical activity    Days per week: Not on file    Minutes per session: Not on file  . Stress: Not on file  Relationships  . Social Herbalist on phone: Not on file    Gets together: Not on file    Attends religious service: Not on file    Active member of club or organization: Not on file    Attends meetings of clubs or organizations: Not on file    Relationship status: Not on file  . Intimate partner violence    Fear of current or  ex partner: Not on file    Emotionally abused: Not on file    Physically abused: Not on file    Forced sexual activity: Not on file  Other Topics Concern  . Not on file  Social History Narrative   She works as a Sports coach.   Right-handed.   One cup caffeine per day.   She lives at home with daughter.     PHYSICAL EXAM   Vitals:   08/29/19 1503  BP: 132/72  Pulse: 98  Temp: (!) 97.3 F (36.3 C)  Weight: 192 lb (87.1 kg)  Height: 5\' 3"  (1.6 m)    Not recorded      Body mass index is 34.01 kg/m.  PHYSICAL EXAMNIATION:  Gen: NAD, conversant, well nourised, well groomed                     Cardiovascular: Regular rate rhythm, no peripheral edema, warm, nontender. Eyes: Conjunctivae clear without exudates or hemorrhage Neck: Supple, no carotid bruits. Pulmonary: Clear to auscultation bilaterally   NEUROLOGICAL EXAM:  MENTAL STATUS: Speech:    Speech is normal; fluent and spontaneous with normal comprehension.  Cognition:     Orientation to time, place and person     Normal recent and remote memory     Normal Attention span and concentration     Normal Language, naming, repeating,spontaneous speech     Fund of knowledge   CRANIAL NERVES: CN II: Visual fields are full to confrontation. Fundoscopic exam is normal with sharp discs and no vascular changes. Pupils are  round equal and briskly reactive to light. CN III, IV, VI: extraocular movement are normal. No ptosis. CN V: Facial sensation is intact to pinprick in all 3 divisions bilaterally. Corneal responses are intact.  CN VII: Face is symmetric with normal eye closure and smile. CN VIII: Hearing is normal to causal conversation. CN IX, X: Palate elevates symmetrically. Phonation is normal. CN XI: Head turning and shoulder shrug are intact CN XII: Tongue is midline with normal movements and no atrophy.  MOTOR: There is no pronator drift of out-stretched arms. Muscle bulk and tone are normal. Muscle strength is normal.  REFLEXES: Reflexes are 2+ and symmetric at the biceps, triceps, knees, and ankles. Plantar responses are flexor.  SENSORY: Intact to light touch, pinprick, positional sensation and vibratory sensation are intact in fingers and toes.  COORDINATION: Rapid alternating movements and fine finger movements are intact. There is no dysmetria on finger-to-nose and heel-knee-shin.    GAIT/STANCE: Posture is normal. Gait is steady with normal steps, base, arm swing, and turning. Heel and toe walking are normal. Tandem gait is normal.  Romberg is absent.   DIAGNOSTIC DATA (LABS, IMAGING, TESTING) - I reviewed patient records, labs, notes, testing and imaging myself where available.   ASSESSMENT AND PLAN  [name] is a 58 y.o. female   Right-sided neck pain, radiating pain to right upper extremity  Increase gabapentin to 300 mg 3 times daily, she is also getting hydrocodone prescription from her orthopedic surgeon,  EMG nerve conduction study   Marcial Pacas, M.D. Ph.D.  [facility] [address] [place], [place] [address] Ph: [phone] Fax: [phone]  CC: Crecencio Mc, MD

## 2019-08-31 ENCOUNTER — Other Ambulatory Visit: Payer: Self-pay

## 2019-08-31 ENCOUNTER — Encounter: Payer: Self-pay | Admitting: Internal Medicine

## 2019-08-31 ENCOUNTER — Ambulatory Visit (INDEPENDENT_AMBULATORY_CARE_PROVIDER_SITE_OTHER): Payer: BC Managed Care – PPO | Admitting: Internal Medicine

## 2019-08-31 VITALS — BP 120/82 | HR 97 | Ht 64.0 in | Wt 193.0 lb

## 2019-08-31 DIAGNOSIS — IMO0002 Reserved for concepts with insufficient information to code with codable children: Secondary | ICD-10-CM

## 2019-08-31 DIAGNOSIS — E11319 Type 2 diabetes mellitus with unspecified diabetic retinopathy without macular edema: Secondary | ICD-10-CM | POA: Diagnosis not present

## 2019-08-31 DIAGNOSIS — E669 Obesity, unspecified: Secondary | ICD-10-CM | POA: Diagnosis not present

## 2019-08-31 DIAGNOSIS — E1169 Type 2 diabetes mellitus with other specified complication: Secondary | ICD-10-CM

## 2019-08-31 DIAGNOSIS — E785 Hyperlipidemia, unspecified: Secondary | ICD-10-CM

## 2019-08-31 DIAGNOSIS — Z794 Long term (current) use of insulin: Secondary | ICD-10-CM | POA: Diagnosis not present

## 2019-08-31 DIAGNOSIS — E1165 Type 2 diabetes mellitus with hyperglycemia: Secondary | ICD-10-CM | POA: Diagnosis not present

## 2019-08-31 DIAGNOSIS — Z6832 Body mass index (BMI) 32.0-32.9, adult: Secondary | ICD-10-CM

## 2019-08-31 LAB — POCT GLYCOSYLATED HEMOGLOBIN (HGB A1C): Hemoglobin A1C: 7.6 % — AB (ref 4.0–5.6)

## 2019-08-31 NOTE — Progress Notes (Signed)
Patient ID: [name], female   DOB: Jan 19, 1961, 58 y.o.   MRN: [id number]  HPI: [name] is a 58 y.o.-year-old female, returning for f/u for DM2, dx ~2000, insulin-dependent since 2010, uncontrolled, with complications (PN, + DR). Last visit 4 months ago-controlled.  Has a pinched nerve in the back and carpal tunnel. Now out of work. On pain medicines. Sugars were higher (even had one spike at 497), now started to improve.  Reviewed HbA1c levels: Lab Results  Component Value Date   HGBA1C 7.8 (A) 11/25/2018   HGBA1C 6.8 (A) 07/25/2018   HGBA1C 6.4% 03/03/2018   Pt is on: Insulin Before breakfast Before lunch Before dinner  R 10-15 10-15 20-25  NPH 15 - 25    Metformin 1000 mg twice a day.  Pt is checking her sugars 1-2 times a day: - am: 67-147, 167, 196 >> 100-120, 300 >> 38 x1, 140-164 >> 59, 68, 84, 94-148, 173 - 2h after b'fast: 154 >> n/c > 203 >> n/c >> 223 - lunch:   90s >> 80-94 >> 100-118 >> n/c >> 140 - mid-afternoon:  45x1 , 90-115 >> n/c >> 248  - before dinner: 135 >> 108-114 >> n/c  - 2h after dinner:  216, 219, 451 >> n/c  - bedtime:  160-180 >> 90-168 >> 119, 143-167, 288  >> 107, 140-180, 196 - nighttime: 145-239, 338 >> n/c >> 188, 200, 497 Lowest sugar was 38 (inj insulin but ate only little) >> 59; she has hypoglycemia awareness in the 80s. Highest sugar was  288 >> 497.  -No CKD, last BUN/creatinine:  Lab Results  Component Value Date   BUN 17 01/19/2019   CREATININE 0.66 01/19/2019   ACR normal: Lab Results  Component Value Date   MICRALBCREAT 1.8 06/27/2018   MICRALBCREAT 1.6 06/23/2017   MICRALBCREAT 0.7 06/19/2016   MICRALBCREAT 0.4 09/25/2015   MICRALBCREAT 0.3 07/30/2014   MICRALBCREAT 0.5 06/19/2013   MICRALBCREAT 1.5 01/31/2013   MICRALBCREAT 2.6 11/20/2011  She is not on ACE inhibitor/ARB.  -+ HL; last set of lipids: Lab Results  Component Value Date   CHOL 238 (H) 12/21/2018   HDL 69 12/21/2018   LDLCALC 150 (H)  12/21/2018   LDLDIRECT 94.0 09/25/2015   TRIG 87 12/21/2018   CHOLHDL 3.4 12/21/2018  On Lipitor.  - last eye exam was in 12/2018: + DR OU, glaucoma - reportedly. She sees Dr. Arnoldo Morale.  -+ Numbness and tingling in her feet. Seeing a podiatrist.  She takes 2 tablets of Neurontin a day.  She also has a history of HTN, episodic alcohol abuse, GERD, low back pain, OA.   ROS: Constitutional: + weight gain/no weight loss, no fatigue, + hot flashes, no subjective hypothermia Eyes: no blurry vision, no xerophthalmia ENT: no sore throat, no nodules palpated in neck, no dysphagia, no odynophagia, no hoarseness Cardiovascular: no CP/no SOB/no palpitations/no leg swelling Respiratory: no cough/no SOB/no wheezing Gastrointestinal: no N/no V/no D/no C/no acid reflux Musculoskeletal: + muscle aches/+ joint aches Skin: no rashes, no hair loss Neurological: no tremors/+ numbness/+ tingling/no dizziness  I reviewed pt's medications, allergies, PMH, social hx, family hx, and changes were documented in the history of present illness. Otherwise, unchanged from my initial visit note.  Past Medical History:  Diagnosis Date  . Arthritis   . Campylobacter enteritis May 2012   [facility] admission  . Carpal tunnel syndrome, bilateral   . Diabetes mellitus   . Gastritis with bleeding due to alcohol 2010  [facility] admission  . GERD (gastroesophageal reflux disease)   . Hyperlipidemia   . Hypertension    no meds  . Neck pain   . Wears glasses    Past Surgical History:  Procedure Laterality Date  . CARPAL TUNNEL RELEASE Left 05/14/2014   Procedure: LEFT ULNAR NEUROPLASTY AT ELBOW AND ENDOSCOPIC CARPAL TUNNEL RELEASE;  Surgeon: Jolyn Nap, MD;  Location: [place];  Service: Orthopedics;  Laterality: Left;  . CYST EXCISION  1995   under arms  . endometrial biopsy  2004   benign  . FOOT OSTEOTOMY  2010   right-spurs  . TONSILLECTOMY    . ULNAR NERVE TRANSPOSITION Left 05/14/2014    Procedure: ULNAR NERVE DECOMPRESSION/TRANSPOSITION;  Surgeon: Jolyn Nap, MD;  Location: [place];  Service: Orthopedics;  Laterality: Left;   Social History   Socioeconomic History  . Marital status: Single    Spouse name: Not on file  . Number of children: 2  . Years of education: 45  . Highest education level: High school graduate  Occupational History  . Not on file  Social Needs  . Financial resource strain: Not on file  . Food insecurity    Worry: Not on file    Inability: Not on file  . Transportation needs    Medical: Not on file    Non-medical: Not on file  Tobacco Use  . Smoking status: Former Smoker    Quit date: 05/09/2012    Years since quitting: 7.3  . Smokeless tobacco: Never Used  Substance and Sexual Activity  . Alcohol use: Not Currently  . Drug use: Yes    Types: Marijuana    Comment: sometimes  . Sexual activity: Yes    Comment: same partner x 10 years  Lifestyle  . Physical activity    Days per week: Not on file    Minutes per session: Not on file  . Stress: Not on file  Relationships  . Social Herbalist on phone: Not on file    Gets together: Not on file    Attends religious service: Not on file    Active member of club or organization: Not on file    Attends meetings of clubs or organizations: Not on file    Relationship status: Not on file  . Intimate partner violence    Fear of current or ex partner: Not on file    Emotionally abused: Not on file    Physically abused: Not on file    Forced sexual activity: Not on file  Other Topics Concern  . Not on file  Social History Narrative   She works as a Sports coach.   Right-handed.   One cup caffeine per day.   She lives at home with daughter.   Current Outpatient Medications on File Prior to Visit  Medication Sig Dispense Refill  . atorvastatin (LIPITOR) 40 MG tablet Take 1 tablet by mouth once daily 90 tablet 0  . Continuous Blood Gluc Sensor (FREESTYLE  LIBRE 14 DAY SENSOR) MISC 1 each by Does not apply route every 14 (fourteen) days. Change every 2 weeks 2 each 11  . gabapentin (NEURONTIN) 300 MG capsule Take 1 capsule (300 mg total) by mouth 3 (three) times daily. 90 capsule 3  . glucose blood (BAYER CONTOUR TEST) test strip TEST BLOOD SUGARS 3 TIMES DAILY 200 each 11  . HYDROcodone-acetaminophen (NORCO) 10-325 MG tablet Take 1-2 tablets by mouth daily. In the evening  for severe neck  pain 60 tablet 0  . insulin NPH Human (NOVOLIN N RELION) 100 UNIT/ML injection Inject 15 units under skin in the AM and 25 units in the evening 30 mL 5  . Lancet Devices (EASY TOUCH LANCING DEVICE) MISC Use to check diabetes 4 times daily  E 11.65 1 each 11  . latanoprost (XALATAN) 0.005 % ophthalmic solution INSTILL 1 DROP INTO EACH EYE ONCE DAILY IN THE EVENING    . metFORMIN (GLUCOPHAGE) 1000 MG tablet TAKE 1 TABLET BY MOUTH TWICE DAILY WITH MEALS 120 tablet 0  . NOVOLIN N RELION 100 UNIT/ML injection INJECT 20 UNITS SUBCUTANEOUSLY IN THE MORNING AND 55 IN THE EVENING 3 mL 5  . nystatin (NYSTATIN) powder Apply powder to affected area twice daily until rash has resolved 15 g 3  . omeprazole (PRILOSEC) 40 MG capsule Take 1 capsule by mouth once daily 90 capsule 3  . tiZANidine (ZANAFLEX) 2 MG tablet TAKE 1 TABLET BY MOUTH EVERY 6 HOURS AS NEEDED FOR MUSCLE SPASM 30 tablet 0   No current facility-administered medications on file prior to visit.    Allergies  Allergen Reactions  . Oxycodone-Acetaminophen Other (See Comments)    Causes her to feel "hot".  . Penicillins Rash   Family History  Problem Relation Age of Onset  . Hypertension Mother   . Stroke Father   . Diabetes Maternal Grandmother   . Hypertension Maternal Grandmother     PE: BP 120/82   Pulse 97   Ht 5\' 4"  (1.626 m)   Wt 193 lb (87.5 kg)   SpO2 98%   BMI 33.13 kg/m  There is no height or weight on file to calculate BMI. Wt Readings from Last 3 Encounters:  08/31/19 193 lb (87.5 kg)   08/29/19 192 lb (87.1 kg)  08/20/19 190 lb (86.2 kg)   Constitutional: overweight, in NAD Eyes: PERRLA, EOMI, no exophthalmos ENT: moist mucous membranes, no thyromegaly, no cervical lymphadenopathy Cardiovascular: tachycardia, RR, No MRG Respiratory: CTA B Gastrointestinal: abdomen soft, NT, ND, BS+ Musculoskeletal: no deformities, strength intact in all 4 Skin: moist, warm, no rashes Neurological: no tremor with outstretched hands, DTR normal in all 4  ASSESSMENT: 1. DM2, insulin-dependent, uncontrolled, with complications - peripheral neuropathy - DR   2. Obesity class 1 - BMI 32.79  BMI Classification:  < 18.5 underweight   18.5-24.9 normal weight   25.0-29.9 overweight   30.0-34.9 class I obesity   35.0-39.9 class II obesity   ? 40.0 class III obesity   3. HL  PLAN:  1. Patient with longstanding, previously uncontrolled diabetes, but with better control in the last 2 years after she started to take her insulin consistently. Of note, patient had periods of time in the past in which she was taking her insulin erratically and her diabetes was completely out-of-control.  Last year, her HbA1c was lower than 7, however, latest HbA1c was higher, at 7.8% at the end of last year.  -We had a virtual appointment 4 months ago, and she was reporting higher blood sugars in the morning, most likely due to eating her dinners late and also possibly snacks after dinner.  Right before the visit she started to move the dinners earlier, around 7 PM.  We discussed about writing down what she eats whenever her sugars are high in the morning and to avoid dose foods/snacks.  We did not change her insulin doses at that time, especially since she had a very low blood sugar,  at 57, after she injected insulin for dinner but did not eat much. -At this visit, sugars are more fluctuating per review of her meter downloads-especially in the last 2 weeks.  She is on pain medicines for back pain and she  feels that the hydrocodone is causing fluctuations in her blood sugars.  They are improving recently but still has some spikes in the 200s.  Due to the fact that she also may have low blood sugars especially in the morning, I did not advise her to change her insulin doses for now.  I advised her to watch her sugars and if they stay above 180s after meals to let me know to increase her regular insulin doses.  I also advised her to try to inject the insulin are 30 minutes before the meals. - I advised her to: Patient Instructions   Please continue: Insulin Before breakfast Before lunch Before dinner  R 10-15 10-15 20-25  NPH 15 - 25    Continue metformin 1000 mg 2x a day.  Please let me know if the sugars stay consistently above 130 (before meals) or above 180 (after meals)  Please return in 4 months with your sugar log.  - we checked her HbA1c: 7.6% (slightly better) - advised to check sugars at different times of the day - 3x a day, rotating check times - advised for yearly eye exams >> she is UTD - return to clinic in 4 months      2. Obesity class 1 -Continue metformin which is weight stabilizing long-term -She gained 10 pounds since last visit: 183 >> 193 lbs -Discussed about the need to improve diet.  3. HL - Reviewed latest lipid panel from 11/2018: LDL above goal Lab Results  Component Value Date   CHOL 238 (H) 12/21/2018   HDL 69 12/21/2018   LDLCALC 150 (H) 12/21/2018   LDLDIRECT 94.0 09/25/2015   TRIG 87 12/21/2018   CHOLHDL 3.4 12/21/2018  - Continues Lipitor without side effects.  Philemon Kingdom, MD PhD [facility] Endocrinology

## 2019-08-31 NOTE — Patient Instructions (Addendum)
Please continue: Insulin Before breakfast Before lunch Before dinner  R 10-15 10-15 20-25  NPH 15 - 25    Continue metformin 1000 mg 2x a day.  Please let me know if the sugars stay consistently above 130 (before meals) or above 180 (after meals)  Please return in 4 months with your sugar log.

## 2019-08-31 NOTE — Addendum Note (Signed)
Addended by: Cardell Peach I on: 08/31/2019 03:00 PM   Modules accepted: Orders

## 2019-09-07 ENCOUNTER — Telehealth: Payer: Self-pay | Admitting: Internal Medicine

## 2019-09-07 NOTE — Telephone Encounter (Signed)
Pt is scheduled for a telephone visit with you tomorrow at 11am to discuss other treatment options for her neck and shoulder pain.

## 2019-09-07 NOTE — Telephone Encounter (Signed)
Patient is still experiencing extreme neck and shoulder pain. Pt is taking medicine as prescribed but its not working and she's uncomfortable . [id number] Please advise

## 2019-09-08 ENCOUNTER — Ambulatory Visit (INDEPENDENT_AMBULATORY_CARE_PROVIDER_SITE_OTHER): Payer: BC Managed Care – PPO | Admitting: Internal Medicine

## 2019-09-08 ENCOUNTER — Encounter: Payer: Self-pay | Admitting: Internal Medicine

## 2019-09-08 ENCOUNTER — Other Ambulatory Visit: Payer: Self-pay

## 2019-09-08 DIAGNOSIS — M4692 Unspecified inflammatory spondylopathy, cervical region: Secondary | ICD-10-CM | POA: Diagnosis not present

## 2019-09-08 DIAGNOSIS — M5412 Radiculopathy, cervical region: Secondary | ICD-10-CM | POA: Diagnosis not present

## 2019-09-08 DIAGNOSIS — K5903 Drug induced constipation: Secondary | ICD-10-CM | POA: Diagnosis not present

## 2019-09-08 NOTE — Assessment & Plan Note (Signed)
Advised to start a daily stimulant laxative

## 2019-09-08 NOTE — Progress Notes (Signed)
Telephone  Note This visit type was conducted due to national recommendations for restrictions regarding the COVID-19 pandemic (e.g. social distancing).  This format is felt to be most appropriate for this patient at this time.  All issues noted in this document were discussed and addressed.  No physical exam was performed (except for noted visual exam findings with Video Visits).   I connected with@ on 09/08/19 at 11:00 AM EDT by  telephone and verified that I am speaking with the correct person using two identifiers. Location patient: home Location provider: work or home office Persons participating in the virtual visit: patient, provider  I discussed the limitations, risks, security and privacy concerns of performing an evaluation and management service by telephone and the availability of in person appointments. I also discussed with the patient that there may be a patient responsible charge related to this service. The patient expressed understanding and agreed to proceed.  Reason for visit: worsening neck and shoulder pain   HPI:  58 yr old female with cervical radiculitis secondary to DDD , referred to [facility] for ESI  And to neurology for EMG nerve conduction studies, presents for follow up on persistent pain. .  She has been using vicodin twice daily without relief of pain. she had a steroid IM injection 2 weeks ago by a provider  at a Pain clinic in [place] had another one there today.  She sounds like  she is in moderate to severe pain and has just left  The pharmacy where she received a 7 day supply of oxycodone /apap 5/325 and a refill of tizanidine  .  She has a one week follow up with the Pain Clinic.   She has been having constipation and mild nausea.  Not drinking enough fluids and not taking a laxative.     ROS: See pertinent positives and negatives per HPI.  Past Medical History:  Diagnosis Date  . Arthritis   . Campylobacter enteritis May 2012   [facility] admission  . Carpal  tunnel syndrome, bilateral   . Diabetes mellitus   . Gastritis with bleeding due to alcohol 2010   [facility] admission  . GERD (gastroesophageal reflux disease)   . Hyperlipidemia   . Hypertension    no meds  . Neck pain   . Wears glasses     Past Surgical History:  Procedure Laterality Date  . CARPAL TUNNEL RELEASE Left 05/14/2014   Procedure: LEFT ULNAR NEUROPLASTY AT ELBOW AND ENDOSCOPIC CARPAL TUNNEL RELEASE;  Surgeon: Jolyn Nap, MD;  Location: [place];  Service: Orthopedics;  Laterality: Left;  . CYST EXCISION  1995   under arms  . endometrial biopsy  2004   benign  . FOOT OSTEOTOMY  2010   right-spurs  . TONSILLECTOMY    . ULNAR NERVE TRANSPOSITION Left 05/14/2014   Procedure: ULNAR NERVE DECOMPRESSION/TRANSPOSITION;  Surgeon: Jolyn Nap, MD;  Location: [place];  Service: Orthopedics;  Laterality: Left;    Family History  Problem Relation Age of Onset  . Hypertension Mother   . Stroke Father   . Diabetes Maternal Grandmother   . Hypertension Maternal Grandmother     SOCIAL HX:  reports that she quit smoking about 7 years ago. She has never used smokeless tobacco. She reports previous alcohol use. She reports current drug use. Drug: Marijuana.   Current Outpatient Medications:  .  atorvastatin (LIPITOR) 40 MG tablet, Take 1 tablet by mouth once daily, Disp: 90 tablet, Rfl: 0 .  Continuous Blood Gluc Sensor (FREESTYLE LIBRE 14 DAY SENSOR) MISC, 1 each by Does not apply route every 14 (fourteen) days. Change every 2 weeks, Disp: 2 each, Rfl: 11 .  gabapentin (NEURONTIN) 300 MG capsule, Take 1 capsule (300 mg total) by mouth 3 (three) times daily., Disp: 90 capsule, Rfl: 3 .  glucose blood (BAYER CONTOUR TEST) test strip, TEST BLOOD SUGARS 3 TIMES DAILY, Disp: 200 each, Rfl: 11 .  insulin NPH Human (NOVOLIN N RELION) 100 UNIT/ML injection, Inject 15 units under skin in the AM and 25 units in the evening, Disp: 30 mL, Rfl: 5 .   Lancet Devices (EASY TOUCH LANCING DEVICE) MISC, Use to check diabetes 4 times daily  E 11.65, Disp: 1 each, Rfl: 11 .  latanoprost (XALATAN) 0.005 % ophthalmic solution, INSTILL 1 DROP INTO EACH EYE ONCE DAILY IN THE EVENING, Disp: , Rfl:  .  metFORMIN (GLUCOPHAGE) 1000 MG tablet, TAKE 1 TABLET BY MOUTH TWICE DAILY WITH MEALS, Disp: 120 tablet, Rfl: 0 .  NOVOLIN N RELION 100 UNIT/ML injection, INJECT 20 UNITS SUBCUTANEOUSLY IN THE MORNING AND 55 IN THE EVENING, Disp: 3 mL, Rfl: 5 .  nystatin (NYSTATIN) powder, Apply powder to affected area twice daily until rash has resolved, Disp: 15 g, Rfl: 3 .  omeprazole (PRILOSEC) 40 MG capsule, Take 1 capsule by mouth once daily, Disp: 90 capsule, Rfl: 3 .  oxyCODONE-acetaminophen (PERCOCET/ROXICET) 5-325 MG tablet, Take by mouth every 4 (four) hours as needed for severe pain., Disp: , Rfl:  .  tiZANidine (ZANAFLEX) 2 MG tablet, TAKE 1 TABLET BY MOUTH EVERY 6 HOURS AS NEEDED FOR MUSCLE SPASM, Disp: 30 tablet, Rfl: 0  EXAM:   General impression: alert, cooperative and articulate.  Sounds to be in moderate  distress due to pain  Lungs: speech is fluent sentence length suggests that patient is not short of breath and not punctuated by cough, sneezing or sniffing. Marland Kitchen   Psych: affect normal.  speech is articulate and non pressured .  Denies suicidal thoughts   ASSESSMENT AND PLAN:  Discussed the following assessment and plan:  Cervical spondylitis with radiculitis (HCC)  Drug induced constipation  Cervical spondylitis with radiculitis ([place]) Confirmed with MRI done by Orthopedics.  Awaiting her first ESI by Chasnis which is scheduled .  Her pain is now managed by [facility] on [address].  She received Oxy/apap 5/325 #28 today and tizanidine refill.  And received an IM steroid injection .  I infomred that that for her safety, I will no longer prescribe vicodin while patient is receiving oxycodone from another provider   Drug induced  constipation Advised to start a daily stimulant laxative     I discussed the assessment and treatment plan with the patient. The patient was provided an opportunity to ask questions and all were answered. The patient agreed with the plan and demonstrated an understanding of the instructions.   The patient was advised to call back or seek an in-person evaluation if the symptoms worsen or if the condition fails to improve as anticipated.   I provided  15 minutes of non-face-to-face time during this encounter reviewing patient's current problems and post surgeries.  Providing counseling on the above mentioned problems , and coordination  of care .  Crecencio Mc, MD

## 2019-09-08 NOTE — Assessment & Plan Note (Signed)
Confirmed with MRI done by Orthopedics.  Awaiting her first ESI by Chasnis which is scheduled .  Her pain is now managed by [facility] on [address].  She received Oxy/apap 5/325 #28 today and tizanidine refill.  And received an IM steroid injection .  I infomred that that for her safety, I will no longer prescribe vicodin while patient is receiving oxycodone from another provider

## 2019-09-26 DIAGNOSIS — Z0289 Encounter for other administrative examinations: Secondary | ICD-10-CM

## 2019-09-27 ENCOUNTER — Other Ambulatory Visit: Payer: Self-pay

## 2019-09-27 ENCOUNTER — Encounter: Payer: BC Managed Care – PPO | Admitting: Neurology

## 2019-09-27 ENCOUNTER — Ambulatory Visit (INDEPENDENT_AMBULATORY_CARE_PROVIDER_SITE_OTHER): Payer: BC Managed Care – PPO | Admitting: Neurology

## 2019-09-27 DIAGNOSIS — M5412 Radiculopathy, cervical region: Secondary | ICD-10-CM

## 2019-09-27 DIAGNOSIS — Z0289 Encounter for other administrative examinations: Secondary | ICD-10-CM

## 2019-09-27 NOTE — Procedures (Signed)
Full Name: [name] Gender: Female MRN #: [id number] Date of Birth: December 09, 1960    Visit Date: 09/27/2019 08:28 Age: 58 Years 32 Months Old Examining Physician: Marcial Pacas, MD  Referring Physician: Marcial Pacas, MD History: 58 year old female with history of right neck pain, radiating pain to right shoulder, right upper extremity  Summary of the tests:  Nerve conduction study: Right median sensory response showed mildly prolonged peak latency, with mildly decreased snap amplitude.  Right median motor responses were normal.  Bilateral ulnar sensory and motor responses were normal.  Left median sensory and motor responses were normal.  Electromyography: Selected needle examination of right upper extremity and right cervical paraspinal muscles were normal.    Conclusion:  This is a mild abnormal study.  There is electrodiagnostic evidence of mild right median neuropathy across the wrist, consistent with mild right carpal tunnel syndrome.  There is no evidence of right cervical radiculopathy.   -------------------------------. Marcial Pacas M.D. Ph.D.  [facility] [place], [place] [address] Tel: [phone] Fax: [phone]        [facility]    Nerve / Sites Muscle Latency Ref. Amplitude Ref. Rel Amp Segments Distance Velocity Ref. Area    ms ms mV mV %  cm m/s m/s mVms  L Median - APB     Wrist APB 3.2 ?4.4 7.0 ?4.0 100 Wrist - APB 7   25.5     Upper arm APB 7.1  6.7  96 Upper arm - Wrist 21 54 ?49 25.5  R Median - APB     Wrist APB 4.4 ?4.4 5.6 ?4.0 100 Wrist - APB 7   22.9     Upper arm APB 8.0  5.4  97.1 Upper arm - Wrist 21 58 ?49 22.6  L Ulnar - ADM     Wrist ADM 2.4 ?3.3 11.3 ?6.0 100 Wrist - ADM 7   29.6     B.Elbow ADM 5.4  10.2  90.2 B.Elbow - Wrist 18 60 ?49 29.6     A.Elbow ADM 7.3  9.8  96.7 A.Elbow - B.Elbow 10 53 ?49 30.8         A.Elbow - Wrist      R Ulnar - ADM     Wrist ADM 2.6 ?3.3 8.1 ?6.0 100 Wrist - ADM 7   31.3   B.Elbow ADM 6.1  7.9  97.6 B.Elbow - Wrist 19 54 ?49 32.4     A.Elbow ADM 8.2  8.1  103 A.Elbow - B.Elbow 10 49 ?49 31.1         A.Elbow - Wrist                 SNC    Nerve / Sites Rec. Site Peak Lat Ref.  Amp Ref. Segments Distance    ms ms V V  cm  L Median - Orthodromic (Dig II, Mid palm)     Dig II Wrist 3.2 ?3.4 10 ?10 Dig II - Wrist 13  R Median - Orthodromic (Dig II, Mid palm)     Dig II Wrist 3.7 ?3.4 6 ?10 Dig II - Wrist 13  L Ulnar - Orthodromic, (Dig V, Mid palm)     Dig V Wrist 3.0 ?3.1 6 ?5 Dig V - Wrist 11  R Ulnar - Orthodromic, (Dig V, Mid palm)     Dig V Wrist 2.9 ?3.1 7 ?5 Dig V - Wrist 11  F  Wave    Nerve F Lat Ref.   ms ms  L Ulnar - ADM 27.0 ?32.0  R Ulnar - ADM 27.5 ?32.0         EMG       EMG Summary Table    Spontaneous MUAP Recruitment  Muscle IA Fib PSW Fasc Other Amp Dur. Poly Pattern  R. First dorsal interosseous Normal None None None _______ Normal Normal Normal Normal  R. Pronator teres Normal None None None _______ Normal Normal Normal Normal  R. Biceps brachii Normal None None None _______ Normal Normal Normal Normal  R. Deltoid Normal None None None _______ Normal Normal Normal Normal  R. Lumbar paraspinals Normal None None None _______ Normal Normal Normal Normal  R. Cervical paraspinals Normal None None None _______ Normal Normal Normal Normal

## 2019-09-28 ENCOUNTER — Telehealth: Payer: Self-pay | Admitting: Neurology

## 2019-09-28 NOTE — Telephone Encounter (Signed)
Paperwork just completed on 09/27/2019.

## 2019-09-28 NOTE — Telephone Encounter (Signed)
I re faxed pt form today.

## 2019-09-28 NOTE — Telephone Encounter (Signed)
Pt is calling in to check status of [facility] paperwork

## 2019-09-29 NOTE — Telephone Encounter (Signed)
She has an active rx for Percocet that cannot be refilled until November 7. If her pain is not controlled with that she needs to go to ER

## 2019-09-29 NOTE — Telephone Encounter (Signed)
Pt stated she was supposed to have shot for pain in her neck and shoulders yesterday but that was pushed to 10/30/19. She would like to know what she is supposed to do regarding the pain while she is out of work. Please follow up with pt.

## 2019-10-03 NOTE — Telephone Encounter (Signed)
LM asking patient to call back so I can advise on below.

## 2019-10-03 NOTE — Telephone Encounter (Signed)
Patient stated that she was actually calling bc she needed return to work paperwork after having nerve conduction study. She said that she was still in pain but luckily she was put on the waiting list & she is scheduled for neck injection today. She said that she would ask that office to provide her a back to work note. I advised she may have to contact physician's office that did nerve conduction study. Patient stated that she would let us know how she is doing or if she needs anything.

## 2019-10-08 ENCOUNTER — Emergency Department (HOSPITAL_COMMUNITY): Payer: BC Managed Care – PPO

## 2019-10-08 ENCOUNTER — Encounter (HOSPITAL_COMMUNITY): Payer: Self-pay | Admitting: Emergency Medicine

## 2019-10-08 ENCOUNTER — Inpatient Hospital Stay (HOSPITAL_COMMUNITY)
Admission: EM | Admit: 2019-10-08 | Discharge: 2019-10-10 | DRG: 247 | Disposition: A | Discharge status: Home / Self Care | Payer: BC Managed Care – PPO | Attending: Internal Medicine | Admitting: Internal Medicine

## 2019-10-08 ENCOUNTER — Other Ambulatory Visit: Payer: Self-pay

## 2019-10-08 DIAGNOSIS — R079 Chest pain, unspecified: Secondary | ICD-10-CM

## 2019-10-08 DIAGNOSIS — M542 Cervicalgia: Secondary | ICD-10-CM | POA: Diagnosis not present

## 2019-10-08 DIAGNOSIS — R778 Other specified abnormalities of plasma proteins: Secondary | ICD-10-CM

## 2019-10-08 DIAGNOSIS — E162 Hypoglycemia, unspecified: Secondary | ICD-10-CM

## 2019-10-08 DIAGNOSIS — Z833 Family history of diabetes mellitus: Secondary | ICD-10-CM

## 2019-10-08 DIAGNOSIS — Z955 Presence of coronary angioplasty implant and graft: Secondary | ICD-10-CM

## 2019-10-08 DIAGNOSIS — E669 Obesity, unspecified: Secondary | ICD-10-CM | POA: Diagnosis present

## 2019-10-08 DIAGNOSIS — I2510 Atherosclerotic heart disease of native coronary artery without angina pectoris: Secondary | ICD-10-CM | POA: Diagnosis present

## 2019-10-08 DIAGNOSIS — E1169 Type 2 diabetes mellitus with other specified complication: Secondary | ICD-10-CM | POA: Diagnosis present

## 2019-10-08 DIAGNOSIS — Z6832 Body mass index (BMI) 32.0-32.9, adult: Secondary | ICD-10-CM

## 2019-10-08 DIAGNOSIS — E11319 Type 2 diabetes mellitus with unspecified diabetic retinopathy without macular edema: Secondary | ICD-10-CM | POA: Diagnosis present

## 2019-10-08 DIAGNOSIS — R7989 Other specified abnormal findings of blood chemistry: Secondary | ICD-10-CM | POA: Diagnosis present

## 2019-10-08 DIAGNOSIS — Z87891 Personal history of nicotine dependence: Secondary | ICD-10-CM

## 2019-10-08 DIAGNOSIS — Z885 Allergy status to narcotic agent status: Secondary | ICD-10-CM

## 2019-10-08 DIAGNOSIS — I10 Essential (primary) hypertension: Secondary | ICD-10-CM | POA: Diagnosis present

## 2019-10-08 DIAGNOSIS — M25511 Pain in right shoulder: Secondary | ICD-10-CM | POA: Diagnosis present

## 2019-10-08 DIAGNOSIS — I25110 Atherosclerotic heart disease of native coronary artery with unstable angina pectoris: Secondary | ICD-10-CM | POA: Diagnosis present

## 2019-10-08 DIAGNOSIS — Z794 Long term (current) use of insulin: Secondary | ICD-10-CM

## 2019-10-08 DIAGNOSIS — Z79899 Other long term (current) drug therapy: Secondary | ICD-10-CM

## 2019-10-08 DIAGNOSIS — E1165 Type 2 diabetes mellitus with hyperglycemia: Secondary | ICD-10-CM | POA: Diagnosis present

## 2019-10-08 DIAGNOSIS — E66811 Obesity, class 1: Secondary | ICD-10-CM | POA: Diagnosis present

## 2019-10-08 DIAGNOSIS — Z713 Dietary counseling and surveillance: Secondary | ICD-10-CM

## 2019-10-08 DIAGNOSIS — F1290 Cannabis use, unspecified, uncomplicated: Secondary | ICD-10-CM | POA: Diagnosis present

## 2019-10-08 DIAGNOSIS — Z8249 Family history of ischemic heart disease and other diseases of the circulatory system: Secondary | ICD-10-CM

## 2019-10-08 DIAGNOSIS — I214 Non-ST elevation (NSTEMI) myocardial infarction: Secondary | ICD-10-CM | POA: Diagnosis not present

## 2019-10-08 DIAGNOSIS — Z880 Allergy status to penicillin: Secondary | ICD-10-CM

## 2019-10-08 DIAGNOSIS — K219 Gastro-esophageal reflux disease without esophagitis: Secondary | ICD-10-CM | POA: Diagnosis present

## 2019-10-08 DIAGNOSIS — Z20828 Contact with and (suspected) exposure to other viral communicable diseases: Secondary | ICD-10-CM | POA: Diagnosis present

## 2019-10-08 DIAGNOSIS — E785 Hyperlipidemia, unspecified: Secondary | ICD-10-CM | POA: Diagnosis present

## 2019-10-08 LAB — BASIC METABOLIC PANEL
Anion gap: 10 (ref 5–15)
BUN: 11 mg/dL (ref 6–20)
CO2: 24 mmol/L (ref 22–32)
Calcium: 9.2 mg/dL (ref 8.9–10.3)
Chloride: 106 mmol/L (ref 98–111)
Creatinine, Ser: 0.73 mg/dL (ref 0.44–1.00)
GFR calc Af Amer: >60 mL/min (ref >60)
GFR calc non Af Amer: >60 mL/min (ref >60)
Glucose, Bld: 245 mg/dL — ABNORMAL HIGH (ref 70–99)
Potassium: 3.7 mmol/L (ref 3.5–5.1)
Sodium: 140 mmol/L (ref 135–145)

## 2019-10-08 LAB — CBC
HCT: 38.6 % (ref 36.0–46.0)
Hemoglobin: 12.3 g/dL (ref 12.0–15.0)
MCH: 29.4 pg (ref 26.0–34.0)
MCHC: 31.9 g/dL (ref 30.0–36.0)
MCV: 92.1 fL (ref 80.0–100.0)
Platelets: 347 10*3/uL (ref 150–400)
RBC: 4.19 MIL/uL (ref 3.87–5.11)
RDW: 12.4 % (ref 11.5–15.5)
WBC: 11.3 10*3/uL — ABNORMAL HIGH (ref 4.0–10.5)
nRBC: 0 % (ref 0.0–0.2)

## 2019-10-08 LAB — TROPONIN I (HIGH SENSITIVITY): Troponin I (High Sensitivity): 28 ng/L — ABNORMAL HIGH (ref <18)

## 2019-10-08 MED ORDER — SODIUM CHLORIDE 0.9% FLUSH
3.0000 mL | Freq: Once | INTRAVENOUS | Status: AC
  Administered 2019-10-09: 3 mL via INTRAVENOUS

## 2019-10-08 NOTE — ED Triage Notes (Signed)
Pt c/o midsternal CP today intermittent, more complaining of pain to neck radiating down R arm x 3 months, pt states worse today.

## 2019-10-09 ENCOUNTER — Observation Stay (HOSPITAL_BASED_OUTPATIENT_CLINIC_OR_DEPARTMENT_OTHER): Payer: BC Managed Care – PPO

## 2019-10-09 ENCOUNTER — Encounter (HOSPITAL_COMMUNITY): Payer: Self-pay | Admitting: Student

## 2019-10-09 ENCOUNTER — Other Ambulatory Visit: Payer: Self-pay

## 2019-10-09 ENCOUNTER — Encounter (HOSPITAL_COMMUNITY)
Admission: EM | Disposition: A | Discharge status: Home / Self Care | Payer: Self-pay | Source: Home / Self Care | Attending: Internal Medicine

## 2019-10-09 ENCOUNTER — Telehealth: Payer: Self-pay | Admitting: *Deleted

## 2019-10-09 DIAGNOSIS — E669 Obesity, unspecified: Secondary | ICD-10-CM | POA: Diagnosis present

## 2019-10-09 DIAGNOSIS — R778 Other specified abnormalities of plasma proteins: Secondary | ICD-10-CM

## 2019-10-09 DIAGNOSIS — E1165 Type 2 diabetes mellitus with hyperglycemia: Secondary | ICD-10-CM | POA: Diagnosis not present

## 2019-10-09 DIAGNOSIS — E162 Hypoglycemia, unspecified: Secondary | ICD-10-CM

## 2019-10-09 DIAGNOSIS — E1169 Type 2 diabetes mellitus with other specified complication: Secondary | ICD-10-CM

## 2019-10-09 DIAGNOSIS — R7989 Other specified abnormal findings of blood chemistry: Secondary | ICD-10-CM | POA: Diagnosis present

## 2019-10-09 DIAGNOSIS — R079 Chest pain, unspecified: Secondary | ICD-10-CM | POA: Insufficient documentation

## 2019-10-09 DIAGNOSIS — I25110 Atherosclerotic heart disease of native coronary artery with unstable angina pectoris: Secondary | ICD-10-CM

## 2019-10-09 DIAGNOSIS — R9431 Abnormal electrocardiogram [ECG] [EKG]: Secondary | ICD-10-CM

## 2019-10-09 DIAGNOSIS — I214 Non-ST elevation (NSTEMI) myocardial infarction: Secondary | ICD-10-CM | POA: Diagnosis not present

## 2019-10-09 DIAGNOSIS — E785 Hyperlipidemia, unspecified: Secondary | ICD-10-CM

## 2019-10-09 HISTORY — PX: CORONARY STENT INTERVENTION: CATH118234

## 2019-10-09 HISTORY — PX: LEFT HEART CATH AND CORONARY ANGIOGRAPHY: CATH118249

## 2019-10-09 LAB — GLUCOSE, CAPILLARY
Glucose-Capillary: 242 mg/dL — ABNORMAL HIGH (ref 70–99)
Glucose-Capillary: 296 mg/dL — ABNORMAL HIGH (ref 70–99)

## 2019-10-09 LAB — LIPID PANEL
Cholesterol: 172 mg/dL (ref 0–200)
HDL: 39 mg/dL — ABNORMAL LOW (ref >40)
LDL Cholesterol: 107 mg/dL — ABNORMAL HIGH (ref 0–99)
Total CHOL/HDL Ratio: 4.4 RATIO
Triglycerides: 132 mg/dL (ref <150)
VLDL: 26 mg/dL (ref 0–40)

## 2019-10-09 LAB — SARS CORONAVIRUS 2 (TAT 6-24 HRS): SARS Coronavirus 2: NEGATIVE

## 2019-10-09 LAB — ECHOCARDIOGRAM COMPLETE
Height: 64 in
Weight: 3033.53 oz

## 2019-10-09 LAB — I-STAT BETA HCG BLOOD, ED (MC, WL, AP ONLY): I-stat hCG, quantitative: <5 m[IU]/mL (ref <5)

## 2019-10-09 LAB — POCT ACTIVATED CLOTTING TIME
Activated Clotting Time: 279 seconds
Activated Clotting Time: 329 seconds

## 2019-10-09 LAB — TROPONIN I (HIGH SENSITIVITY)
Troponin I (High Sensitivity): 38 ng/L — ABNORMAL HIGH (ref <18)
Troponin I (High Sensitivity): 51 ng/L — ABNORMAL HIGH (ref <18)
Troponin I (High Sensitivity): 46 ng/L — ABNORMAL HIGH (ref <18)

## 2019-10-09 LAB — HIV ANTIBODY (ROUTINE TESTING W REFLEX): HIV Screen 4th Generation wRfx: NONREACTIVE

## 2019-10-09 SURGERY — LEFT HEART CATH AND CORONARY ANGIOGRAPHY
Anesthesia: LOCAL

## 2019-10-09 MED ORDER — METOPROLOL SUCCINATE ER 50 MG PO TB24
50.0000 mg | ORAL_TABLET | Freq: Every day | ORAL | Status: DC
  Administered 2019-10-09 – 2019-10-10 (×2): 50 mg via ORAL
  Filled 2019-10-09 (×2): qty 1

## 2019-10-09 MED ORDER — VERAPAMIL HCL 2.5 MG/ML IV SOLN
INTRA_ARTERIAL | Status: DC | PRN
  Administered 2019-10-09: 10 mL via INTRA_ARTERIAL

## 2019-10-09 MED ORDER — ATORVASTATIN CALCIUM 40 MG PO TABS
40.0000 mg | ORAL_TABLET | Freq: Every day | ORAL | Status: DC
  Administered 2019-10-09 – 2019-10-10 (×2): 40 mg via ORAL
  Filled 2019-10-09 (×2): qty 1

## 2019-10-09 MED ORDER — SODIUM CHLORIDE 0.9 % WEIGHT BASED INFUSION
1.0000 mL/kg/h | INTRAVENOUS | Status: AC
  Administered 2019-10-09: 1 mL/kg/h via INTRAVENOUS

## 2019-10-09 MED ORDER — INSULIN ASPART 100 UNIT/ML ~~LOC~~ SOLN
0.0000 [IU] | Freq: Three times a day (TID) | SUBCUTANEOUS | Status: DC
  Administered 2019-10-09 – 2019-10-10 (×2): 5 [IU] via SUBCUTANEOUS
  Administered 2019-10-10: 8 [IU] via SUBCUTANEOUS

## 2019-10-09 MED ORDER — LATANOPROST 0.005 % OP SOLN
1.0000 [drp] | Freq: Every day | OPHTHALMIC | Status: DC
  Administered 2019-10-09: 1 [drp] via OPHTHALMIC
  Filled 2019-10-09: qty 2.5

## 2019-10-09 MED ORDER — ASPIRIN 81 MG PO CHEW
81.0000 mg | CHEWABLE_TABLET | Freq: Every day | ORAL | Status: DC
  Administered 2019-10-10: 81 mg via ORAL
  Filled 2019-10-09: qty 1

## 2019-10-09 MED ORDER — SODIUM CHLORIDE 0.9 % IV SOLN: 250.0000 mL | INTRAVENOUS | Status: DC | PRN

## 2019-10-09 MED ORDER — HEPARIN SODIUM (PORCINE) 1000 UNIT/ML IJ SOLN
INTRAMUSCULAR | Status: DC | PRN
  Administered 2019-10-09: 4500 [IU] via INTRAVENOUS
  Administered 2019-10-09: 4000 [IU] via INTRAVENOUS

## 2019-10-09 MED ORDER — SODIUM CHLORIDE 0.9% FLUSH
3.0000 mL | Freq: Two times a day (BID) | INTRAVENOUS | Status: DC
  Administered 2019-10-10: 3 mL via INTRAVENOUS

## 2019-10-09 MED ORDER — OXYCODONE-ACETAMINOPHEN 7.5-325 MG PO TABS
1.0000 | ORAL_TABLET | Freq: Three times a day (TID) | ORAL | Status: DC | PRN
  Administered 2019-10-09 – 2019-10-10 (×3): 1 via ORAL
  Filled 2019-10-09 (×3): qty 1

## 2019-10-09 MED ORDER — VERAPAMIL HCL 2.5 MG/ML IV SOLN
INTRAVENOUS | Status: AC
  Filled 2019-10-09: qty 2

## 2019-10-09 MED ORDER — NITROGLYCERIN 1 MG/10 ML FOR IR/CATH LAB
INTRA_ARTERIAL | Status: DC | PRN
  Administered 2019-10-09 (×5): 200 ug via INTRACORONARY

## 2019-10-09 MED ORDER — PANTOPRAZOLE SODIUM 40 MG PO TBEC
40.0000 mg | DELAYED_RELEASE_TABLET | Freq: Every day | ORAL | Status: DC
  Administered 2019-10-09 – 2019-10-10 (×2): 40 mg via ORAL
  Filled 2019-10-09 (×2): qty 1

## 2019-10-09 MED ORDER — TICAGRELOR 90 MG PO TABS
ORAL_TABLET | ORAL | Status: AC
  Filled 2019-10-09: qty 2

## 2019-10-09 MED ORDER — GABAPENTIN 300 MG PO CAPS
300.0000 mg | ORAL_CAPSULE | Freq: Three times a day (TID) | ORAL | Status: DC
  Administered 2019-10-09 – 2019-10-10 (×2): 300 mg via ORAL
  Filled 2019-10-09 (×2): qty 1

## 2019-10-09 MED ORDER — LIDOCAINE HCL (PF) 1 % IJ SOLN
INTRAMUSCULAR | Status: DC | PRN
  Administered 2019-10-09: 3 mL

## 2019-10-09 MED ORDER — NITROGLYCERIN 1 MG/10 ML FOR IR/CATH LAB
INTRA_ARTERIAL | Status: AC
  Filled 2019-10-09: qty 10

## 2019-10-09 MED ORDER — ASPIRIN 81 MG PO CHEW
324.0000 mg | CHEWABLE_TABLET | Freq: Once | ORAL | Status: AC
  Administered 2019-10-09: 324 mg via ORAL
  Filled 2019-10-09: qty 4

## 2019-10-09 MED ORDER — ADULT MULTIVITAMIN W/MINERALS CH
1.0000 | ORAL_TABLET | Freq: Every day | ORAL | Status: DC
  Administered 2019-10-10: 1 via ORAL
  Filled 2019-10-09: qty 1

## 2019-10-09 MED ORDER — HYDRALAZINE HCL 20 MG/ML IJ SOLN: 5.0000 mg | INTRAMUSCULAR | Status: AC | PRN

## 2019-10-09 MED ORDER — VITAMIN B-12 1000 MCG PO TABS
1000.0000 ug | ORAL_TABLET | Freq: Every day | ORAL | Status: DC
  Administered 2019-10-10: 1000 ug via ORAL
  Filled 2019-10-09: qty 1

## 2019-10-09 MED ORDER — THE SENSUOUS HEART BOOK
Freq: Once | Status: AC
  Administered 2019-10-10: 04:00:00
  Filled 2019-10-09: qty 1

## 2019-10-09 MED ORDER — SODIUM CHLORIDE 0.9 % IV SOLN
INTRAVENOUS | Status: DC
  Administered 2019-10-09: 14:00:00 via INTRAVENOUS

## 2019-10-09 MED ORDER — ONDANSETRON HCL 4 MG/2ML IJ SOLN: 4.0000 mg | Freq: Four times a day (QID) | INTRAMUSCULAR | Status: DC | PRN

## 2019-10-09 MED ORDER — SODIUM CHLORIDE 0.9% FLUSH: 3.0000 mL | INTRAVENOUS | Status: DC | PRN

## 2019-10-09 MED ORDER — IRBESARTAN 75 MG PO TABS
75.0000 mg | ORAL_TABLET | Freq: Every day | ORAL | Status: DC
  Administered 2019-10-09 – 2019-10-10 (×2): 75 mg via ORAL
  Filled 2019-10-09 (×2): qty 1

## 2019-10-09 MED ORDER — FENTANYL CITRATE (PF) 100 MCG/2ML IJ SOLN
50.0000 ug | Freq: Once | INTRAMUSCULAR | Status: AC
  Administered 2019-10-09: 50 ug via INTRAVENOUS
  Filled 2019-10-09: qty 2

## 2019-10-09 MED ORDER — FENTANYL CITRATE (PF) 100 MCG/2ML IJ SOLN
INTRAMUSCULAR | Status: DC | PRN
  Administered 2019-10-09 (×2): 25 ug via INTRAVENOUS
  Administered 2019-10-09: 50 ug via INTRAVENOUS

## 2019-10-09 MED ORDER — ACETAMINOPHEN 325 MG PO TABS: 650.0000 mg | ORAL_TABLET | ORAL | Status: DC | PRN

## 2019-10-09 MED ORDER — IOHEXOL 350 MG/ML SOLN
INTRAVENOUS | Status: DC | PRN
  Administered 2019-10-09: 230 mL via INTRACARDIAC

## 2019-10-09 MED ORDER — MIDAZOLAM HCL 2 MG/2ML IJ SOLN
INTRAMUSCULAR | Status: AC
  Filled 2019-10-09: qty 2

## 2019-10-09 MED ORDER — HEART ATTACK BOUNCING BOOK
Freq: Once | Status: AC
  Administered 2019-10-10: 04:00:00
  Filled 2019-10-09: qty 1

## 2019-10-09 MED ORDER — ALUM & MAG HYDROXIDE-SIMETH 200-200-20 MG/5ML PO SUSP: 30.0000 mL | Freq: Four times a day (QID) | ORAL | Status: DC | PRN

## 2019-10-09 MED ORDER — NITROGLYCERIN 0.4 MG SL SUBL: 0.4000 mg | SUBLINGUAL_TABLET | SUBLINGUAL | Status: DC | PRN

## 2019-10-09 MED ORDER — MIDAZOLAM HCL 2 MG/2ML IJ SOLN
INTRAMUSCULAR | Status: DC | PRN
  Administered 2019-10-09: 2 mg via INTRAVENOUS

## 2019-10-09 MED ORDER — HEPARIN (PORCINE) IN NACL 1000-0.9 UT/500ML-% IV SOLN
INTRAVENOUS | Status: AC
  Filled 2019-10-09: qty 1000

## 2019-10-09 MED ORDER — ASPIRIN 81 MG PO CHEW: 81.0000 mg | CHEWABLE_TABLET | ORAL | Status: DC

## 2019-10-09 MED ORDER — HEPARIN (PORCINE) IN NACL 1000-0.9 UT/500ML-% IV SOLN
INTRAVENOUS | Status: DC | PRN
  Administered 2019-10-09 (×2): 500 mL

## 2019-10-09 MED ORDER — ANGIOPLASTY BOOK
Freq: Once | Status: AC
  Administered 2019-10-10: 04:00:00
  Filled 2019-10-09: qty 1

## 2019-10-09 MED ORDER — HEPARIN SODIUM (PORCINE) 1000 UNIT/ML IJ SOLN
INTRAMUSCULAR | Status: AC
  Filled 2019-10-09: qty 1

## 2019-10-09 MED ORDER — FENTANYL CITRATE (PF) 100 MCG/2ML IJ SOLN
INTRAMUSCULAR | Status: AC
  Filled 2019-10-09: qty 2

## 2019-10-09 MED ORDER — LIDOCAINE HCL (PF) 1 % IJ SOLN
INTRAMUSCULAR | Status: AC
  Filled 2019-10-09: qty 30

## 2019-10-09 MED ORDER — TICAGRELOR 90 MG PO TABS
ORAL_TABLET | ORAL | Status: DC | PRN
  Administered 2019-10-09: 180 mg via ORAL

## 2019-10-09 MED ORDER — ENOXAPARIN SODIUM 40 MG/0.4ML ~~LOC~~ SOLN
40.0000 mg | SUBCUTANEOUS | Status: DC
  Administered 2019-10-09 – 2019-10-10 (×2): 40 mg via SUBCUTANEOUS
  Filled 2019-10-09 (×2): qty 0.4

## 2019-10-09 MED ORDER — TICAGRELOR 90 MG PO TABS
90.0000 mg | ORAL_TABLET | Freq: Two times a day (BID) | ORAL | Status: DC
  Administered 2019-10-10 (×2): 90 mg via ORAL
  Filled 2019-10-09 (×2): qty 1

## 2019-10-09 SURGICAL SUPPLY — 21 items
BALLN SAPPHIRE 2.5X20 (BALLOONS) ×2
BALLN SAPPHIRE ~~LOC~~ 2.25X8 (BALLOONS) ×2 IMPLANT
BALLOON SAPPHIRE 2.5X20 (BALLOONS) ×1 IMPLANT
CATH LAUNCHER 6FR AL1 (CATHETERS) ×1 IMPLANT
CATH LAUNCHER 6FR JR4 (CATHETERS) ×2 IMPLANT
CATH OPTITORQUE TIG 4.0 5F (CATHETERS) ×2 IMPLANT
CATHETER LAUNCHER 6FR AL1 (CATHETERS) ×2
DEVICE RAD TR BAND REGULAR (VASCULAR PRODUCTS) ×2 IMPLANT
GLIDESHEATH SLEND A-KIT 6F 22G (SHEATH) ×2 IMPLANT
GUIDEWIRE INQWIRE 1.5J.035X260 (WIRE) ×2 IMPLANT
INQWIRE 1.5J .035X260CM (WIRE) ×4
KIT ENCORE 26 ADVANTAGE (KITS) ×2 IMPLANT
KIT HEART LEFT (KITS) ×2 IMPLANT
PACK CARDIAC CATHETERIZATION (CUSTOM PROCEDURE TRAY) ×2 IMPLANT
STENT RESOLUTE ONYX 2.25X15 (Permanent Stent) ×2 IMPLANT
STENT RESOLUTE ONYX 2.5X38 (Permanent Stent) ×2 IMPLANT
STENT RESOLUTE ONYX 2.5X8 (Permanent Stent) ×2 IMPLANT
TRANSDUCER W/STOPCOCK (MISCELLANEOUS) ×2 IMPLANT
TUBING CIL FLEX 10 FLL-RA (TUBING) ×2 IMPLANT
WIRE COUGAR XT STRL 190CM (WIRE) ×2 IMPLANT
WIRE HI TORQ VERSACORE-J 145CM (WIRE) ×2 IMPLANT

## 2019-10-09 NOTE — ED Provider Notes (Addendum)
[name] EMERGENCY DEPARTMENT Provider Note   CSN: [id number] Arrival date & time: 10/08/19  2034     History   Chief Complaint Chief Complaint  Patient presents with  . Chest Pain    HPI [name] is a 58 y.o. female with a history of diabetes mellitus, hypertension, hyperlipidemia, & prior gastritis w/ bleeding secondary to alcohol use who presents to the emergency department with complaints of intermittent chest pain for the past 1 week as well as right-sided neck pain for the past few months.  Patient states that she has had issues with pain to the right side of her neck into her right upper extremity for the past few months, she has seen PCP as well as specialist and received injections in her neck for this without significant relief.  She states that over the past 1 week she has developed central chest discomfort intermittently, more with so with movement/exertion, no alleviating factors, she states the pain quality is similar to that in her right neck and that it sometimes radiates to the neck, but this again just started 1 week ago.  Denies fever, chills, nausea, vomiting, dyspnea, diaphoresis, syncope, numbness, weakness, leg pain/swelling, hemoptysis, recent surgery/trauma, recent long travel, hormone use, personal hx of cancer, or hx of DVT/PE.   HPI  Past Medical History:  Diagnosis Date  . Arthritis   . Campylobacter enteritis May 2012   [facility] admission  . Carpal tunnel syndrome, bilateral   . Diabetes mellitus   . Gastritis with bleeding due to alcohol 2010   [facility] admission  . GERD (gastroesophageal reflux disease)   . Hyperlipidemia   . Hypertension    no meds  . Neck pain   . Wears glasses     Patient Active Problem List   Diagnosis Date Noted  . Drug induced constipation 09/08/2019  . Cervical spondylitis with radiculitis ([place]) 08/29/2019  . Acute bursitis of left shoulder 01/30/2019  . Chronic midline posterior neck pain 06/28/2018   . Urgency incontinence 06/28/2018  . Degenerative joint disease of knee, left 04/26/2018  . Carpal tunnel syndrome on both sides 03/29/2018  . Anemia 01/02/2018  . Class 1 obesity with serious comorbidity and body mass index (BMI) of 32.0 to 32.9 in adult 07/23/2017  . Left hip pain 05/04/2017  . Carpal tunnel syndrome on right 12/27/2016  . Polyarthralgia 11/09/2016  . Uncontrolled type 2 diabetes mellitus with retinopathy, with long-term current use of insulin ([place]) 07/07/2016  . Visit for preventive health examination 06/21/2016  . Increased urinary frequency 06/21/2016  . Chronic meniscal tear of knee 08/19/2015  . Left knee pain 06/19/2015  . Candidiasis of skin 06/18/2015  . Genital herpes 07/31/2014  . Ulnar nerve compression 09/21/2013  . Noncompliance with diet and medication regimen 11/15/2012  . Boil, breast 08/14/2012  . Arthritis   . Screening for breast cancer 08/04/2011  . Screening for colon cancer 08/04/2011  . HIDRADENITIS SUPPURATIVA 04/11/2007  . GERD 11/17/2006  . ENDOMETRIAL POLYP 11/17/2006  . LOW BACK PAIN 11/17/2006  . FIBROIDS, UTERUS 11/16/2006  . Hyperlipidemia associated with type 2 diabetes mellitus ([place]) 11/16/2006  . Essential hypertension 11/16/2006    Past Surgical History:  Procedure Laterality Date  . CARPAL TUNNEL RELEASE Left 05/14/2014   Procedure: LEFT ULNAR NEUROPLASTY AT ELBOW AND ENDOSCOPIC CARPAL TUNNEL RELEASE;  Surgeon: Jolyn Nap, MD;  Location: [place];  Service: Orthopedics;  Laterality: Left;  . [place]  under arms  . endometrial biopsy  2004   benign  . FOOT OSTEOTOMY  2010   right-spurs  . TONSILLECTOMY    . ULNAR NERVE TRANSPOSITION Left 05/14/2014   Procedure: ULNAR NERVE DECOMPRESSION/TRANSPOSITION;  Surgeon: Jolyn Nap, MD;  Location: [place];  Service: Orthopedics;  Laterality: Left;     OB History   No obstetric history on file.      Home Medications     Prior to Admission medications   Medication Sig Start Date End Date Taking? Authorizing Provider  atorvastatin (LIPITOR) 40 MG tablet Take 1 tablet by mouth once daily 07/21/19   Crecencio Mc, MD  Continuous Blood Gluc Sensor (FREESTYLE LIBRE 14 DAY SENSOR) MISC 1 each by Does not apply route every 14 (fourteen) days. Change every 2 weeks 11/01/17   Philemon Kingdom, MD  gabapentin (NEURONTIN) 300 MG capsule Take 1 capsule (300 mg total) by mouth 3 (three) times daily. 08/29/19   Marcial Pacas, MD  glucose blood (BAYER CONTOUR TEST) test strip TEST BLOOD SUGARS 3 TIMES DAILY 08/12/16   Philemon Kingdom, MD  insulin NPH Human (NOVOLIN N RELION) 100 UNIT/ML injection Inject 15 units under skin in the AM and 25 units in the evening 11/01/17   Philemon Kingdom, MD  Lancet Devices (EASY TOUCH LANCING DEVICE) MISC Use to check diabetes 4 times daily  E 11.65 08/12/16   Philemon Kingdom, MD  latanoprost (XALATAN) 0.005 % ophthalmic solution INSTILL 1 DROP INTO EACH EYE ONCE DAILY IN THE EVENING 12/02/18   [provider]  metFORMIN (GLUCOPHAGE) 1000 MG tablet TAKE 1 TABLET BY MOUTH TWICE DAILY WITH MEALS 03/28/19   Crecencio Mc, MD  NOVOLIN N RELION 100 UNIT/ML injection INJECT 20 UNITS SUBCUTANEOUSLY IN THE MORNING AND 55 IN THE EVENING 11/07/18   McLean-Scocuzza, Nino Glow, MD  nystatin (NYSTATIN) powder Apply powder to affected area twice daily until rash has resolved 03/28/18   Crecencio Mc, MD  omeprazole (PRILOSEC) 40 MG capsule Take 1 capsule by mouth once daily 05/23/19   Crecencio Mc, MD  oxyCODONE-acetaminophen (PERCOCET/ROXICET) 5-325 MG tablet Take by mouth every 4 (four) hours as needed for severe pain.    [provider]  tiZANidine (ZANAFLEX) 2 MG tablet TAKE 1 TABLET BY MOUTH EVERY 6 HOURS AS NEEDED FOR MUSCLE SPASM 08/23/19   Pete Pelt, PA-C    Family History Family History  Problem Relation Age of Onset  . Hypertension Mother   . Stroke Father   . Diabetes  Maternal Grandmother   . Hypertension Maternal Grandmother     Social History Social History   Tobacco Use  . Smoking status: Former Smoker    Quit date: 05/09/2012    Years since quitting: 7.4  . Smokeless tobacco: Never Used  Substance Use Topics  . Alcohol use: Not Currently  . Drug use: Yes    Types: Marijuana    Comment: sometimes     Allergies   Oxycodone-acetaminophen and Penicillins   Review of Systems Review of Systems  Constitutional: Negative for chills, diaphoresis and fever.  Respiratory: Negative for cough and shortness of breath.   Cardiovascular: Positive for chest pain.  Gastrointestinal: Negative for abdominal pain, nausea and vomiting.  Musculoskeletal: Positive for arthralgias and neck pain.  Neurological: Negative for weakness and numbness.  All other systems reviewed and are negative.   Physical Exam Updated Vital Signs BP (!) 155/83 (BP Location: Left Arm)   Pulse 77  Temp 98 F (36.7 C) (Oral)   Resp 16   Ht 5\' 4"  (1.626 m)   Wt 86 kg   SpO2 97%   BMI 32.54 kg/m   Physical Exam Vitals signs and nursing note reviewed.  Constitutional:      General: She is not in acute distress.    Appearance: Normal appearance. She is well-developed. She is not ill-appearing or toxic-appearing.  HENT:     Head: Normocephalic and atraumatic.  Eyes:     General:        Right eye: No discharge.        Left eye: No discharge.     Conjunctiva/sclera: Conjunctivae normal.  Neck:     Musculoskeletal: Neck supple. Muscular tenderness (R trapezius) present.     Comments: No point/focal midline tenderness.  Cardiovascular:     Rate and Rhythm: Normal rate and regular rhythm.     Pulses:          Radial pulses are 2+ on the right side and 2+ on the left side.  Pulmonary:     Effort: Pulmonary effort is normal. No respiratory distress.     Breath sounds: Normal breath sounds. No wheezing, rhonchi or rales.  Chest:     Chest wall: Tenderness (Anterior  chest wall without overlying skin changes or palpable crepitus.) present.  Abdominal:     General: There is no distension.     Palpations: Abdomen is soft.     Tenderness: There is no abdominal tenderness.  Musculoskeletal:     Comments: Upper extremities: No obvious deformity, appreciable swelling, edema, erythema, ecchymosis, warmth, or open wounds. Patient has intact AROM throughout.  No point/focal bony tenderness.  Skin:    General: Skin is warm and dry.     Capillary Refill: Capillary refill takes less than 2 seconds.     Findings: No rash.  Neurological:     Mental Status: She is alert.     Comments: Alert. Clear speech. Sensation grossly intact to bilateral upper extremities. 5/5 symmetric strength with shoulder flexion/extension, elbow flexion/extension, and grip strength.   Psychiatric:        Mood and Affect: Mood normal.        Behavior: Behavior normal.     ED Treatments / Results  Labs (all labs ordered are listed, but only abnormal results are displayed) Labs Reviewed  BASIC METABOLIC PANEL - Abnormal; Notable for the following components:      Result Value   Glucose, Bld 245 (*)    All other components within normal limits  CBC - Abnormal; Notable for the following components:   WBC 11.3 (*)    All other components within normal limits  TROPONIN I (HIGH SENSITIVITY) - Abnormal; Notable for the following components:   Troponin I (High Sensitivity) 28 (*)    All other components within normal limits  TROPONIN I (HIGH SENSITIVITY) - Abnormal; Notable for the following components:   Troponin I (High Sensitivity) 38 (*)    All other components within normal limits  I-STAT BETA HCG BLOOD, ED (MC, WL, AP ONLY)    EKG EKG Interpretation  Date/Time:  Sunday October 08 2019 20:42:57 EST Ventricular Rate:  83 PR Interval:  134 QRS Duration: 86 QT Interval:  360 QTC Calculation: 423 R Axis:   18 Text Interpretation: Normal sinus rhythm Normal ECG flipped t wave  in lead III Otherwise no significant change Confirmed by [name] [phone]) on 10/09/2019 8:03:53 AM   Radiology Dg Chest  2 View  Result Date: 10/08/2019 CLINICAL DATA:  Chest pain EXAM: CHEST - 2 VIEW COMPARISON:  12/31/2017 FINDINGS: The heart size and mediastinal contours are within normal limits. Both lungs are clear. The visualized skeletal structures are unremarkable. IMPRESSION: No active cardiopulmonary disease. Electronically Signed   By: [name] M.D.   On: 10/08/2019 21:52    Procedures .Critical Care Performed by: Wendy Dyke, PA-C Authorized by: Wendy Dyke, PA-C    CRITICAL CARE Performed by: [name]   Total critical care time: 30 minutes  Critical care time was exclusive of separately billable procedures and treating other patients.  Critical care was necessary to treat or prevent imminent or life-threatening deterioration.  Critical care was time spent personally by me on the following activities: development of treatment plan with patient and/or surrogate as well as nursing, discussions with consultants, evaluation of patient's response to treatment, examination of patient, obtaining history from patient or surrogate, ordering and performing treatments and interventions, ordering and review of laboratory studies, ordering and review of radiographic studies, pulse oximetry and re-evaluation of patient's condition.   (including critical care time)  Medications Ordered in ED Medications  sodium chloride flush (NS) 0.9 % injection 3 mL (3 mLs Intravenous Given 10/09/19 0845)  fentaNYL (SUBLIMAZE) injection 50 mcg (50 mcg Intravenous Given 10/09/19 0843)  aspirin chewable tablet 324 mg (324 mg Oral Given 10/09/19 [id number])     Initial Impression / Assessment and Plan / ED Course  I have reviewed the triage vital signs and the nursing notes.  Pertinent labs & imaging results that were available during my care of the patient were  reviewed by me and considered in my medical decision making (see chart for details).   Patient presents to the emergency department with complaints of right-sided neck pain for the past 3 months and now with central chest discomfort intermittently over the past 1 week.  Patient is nontoxic-appearing, no apparent distress, vitals without significant abnormality, BP elevated some.  Work-up per triage has been reviewed:  CBC: Mild leukocytosis at 11.3 felt to be nonspecific.  No anemia BMP: Hyperglycemia without acidosis or anion gap elevation. Pregnancy test: Negative Troponin: Initial 28, elevation to 38 with repeat EKG: No STEMI, flipped T wave in lead III otherwise no significant change. Chest x-ray: No active cardiopulmonary disease.  Her neck pain seems more musculoskeletal given tenderness to her right trapezius muscle. Low risk [place], doubt PE. No widened mediastinum on x-ray, symmetric pulses, doubt dissection. Her chest discomfort does seem somewhat reproducible with anterior chest wall palpation, however it is exertional as well and she has an uptrending high-sensitivity troponin.  She has a heart pathway score of 5 therefore feel she warrants admission at this time. Discussed findings and plan of care with supervising surgeon [name] who is in agreement.  08:50: CONSULT: Discussed with hospitalist [name]- accepts admission.   [name] was evaluated in Emergency Department on 10/09/2019 for the symptoms described in the history of present illness. He/she was evaluated in the context of the global COVID-19 pandemic, which necessitated consideration that the patient might be at risk for infection with the SARS-CoV-2 virus that causes COVID-19. Institutional protocols and algorithms that pertain to the evaluation of patients at risk for COVID-19 are in a state of rapid change based on information released by regulatory bodies including the CDC and federal and state organizations.  These policies and algorithms were followed during the patient's care in the ED.  Final Clinical Impressions(s) / ED Diagnoses   Final diagnoses:  Chest pain, unspecified type  Neck pain    ED Discharge Orders    None       Wendy Dyke, PA-C 10/09/19 [place], [place], DO 10/09/19 0918      Wendy Dyke, PA-C 10/09/19 [place], [place], DO 10/09/19 [phone]

## 2019-10-09 NOTE — H&P (Signed)
History and Physical    [name] [id number] DOB: 10-12-1961 DOA: 10/08/2019  Referring MD/NP/PA: Kennith Maes, PA-C PCP: Crecencio Mc, MD  Patient coming from: home  Chief Complaint: Chest pain  I have personally briefly reviewed patient's old medical records in [place]   HPI: [name] is a 58 y.o. female with medical history significant of HTN, HLD, diabetes mellitus type 2, and GERD.  She presents with complaints of chest pain.  She describes a 2-3 month history of shoulder and neck pain.  She underwent testing on 10/28 that showed signs of carpal tunnel and subsequently underwent steroid injection into her neck on 11/3.  She reported no change in her pain symptoms.  Yesterday, she found out that her sister had just passed away here at [facility] and last night she was unable to sleep because her chest was bothering her.  Initially started on the right side but moved over to the left.  Associated symptoms include complaints of worsening of the pain in her right shoulder that would radiate into her neck.  Any kind of exertion seems to exacerbate symptoms and resting seems to help.  Denies having any nausea, vomiting, abdominal pain, diaphoresis, leg swelling, calf pain, orthopnea, or significant shortness of breath.  ED Course: Upon admission to the emergency department patient was noted to be afebrile with blood pressures elevated up to 185/99, but all other vitals relatively stable.  Labs significant for WBC 11.3, glucose 245, high-sensitivity troponin 28-> 38.  Chest x-ray did not show any acute abnormalities.  Patient was given 50 mcg of fentanyl and full dose aspirin.  TRH called to admit for chest pain rule out.  Review of systems: A complete 10 point review of systems was performed and negative except for as noted above in the HPI  Past Medical History:  Diagnosis Date  . Arthritis   . Campylobacter enteritis May 2012   [facility] admission  .  Carpal tunnel syndrome, bilateral   . Diabetes mellitus   . Gastritis with bleeding due to alcohol 2010   [facility] admission  . GERD (gastroesophageal reflux disease)   . Hyperlipidemia   . Hypertension    no meds  . Neck pain   . Wears glasses     Past Surgical History:  Procedure Laterality Date  . CARPAL TUNNEL RELEASE Left 05/14/2014   Procedure: LEFT ULNAR NEUROPLASTY AT ELBOW AND ENDOSCOPIC CARPAL TUNNEL RELEASE;  Surgeon: Jolyn Nap, MD;  Location: [place];  Service: Orthopedics;  Laterality: Left;  . CYST EXCISION  1995   under arms  . endometrial biopsy  2004   benign  . FOOT OSTEOTOMY  2010   right-spurs  . TONSILLECTOMY    . ULNAR NERVE TRANSPOSITION Left 05/14/2014   Procedure: ULNAR NERVE DECOMPRESSION/TRANSPOSITION;  Surgeon: Jolyn Nap, MD;  Location: [place];  Service: Orthopedics;  Laterality: Left;     reports that she quit smoking about 7 years ago. She has never used smokeless tobacco. She reports previous alcohol use. She reports current drug use. Drug: Marijuana.  Allergies  Allergen Reactions  . Oxycodone-Acetaminophen Other (See Comments)    Causes her to feel "hot".  . Penicillins Rash    Family History  Problem Relation Age of Onset  . Hypertension Mother   . Stroke Father   . Diabetes Maternal Grandmother   . Hypertension Maternal Grandmother     Prior to Admission medications   Medication  Sig Start Date End Date Taking? Authorizing Provider  atorvastatin (LIPITOR) 40 MG tablet Take 1 tablet by mouth once daily 07/21/19   Crecencio Mc, MD  Continuous Blood Gluc Sensor (FREESTYLE LIBRE 14 DAY SENSOR) MISC 1 each by Does not apply route every 14 (fourteen) days. Change every 2 weeks 11/01/17   Philemon Kingdom, MD  gabapentin (NEURONTIN) 300 MG capsule Take 1 capsule (300 mg total) by mouth 3 (three) times daily. 08/29/19   Marcial Pacas, MD  glucose blood (BAYER CONTOUR TEST) test strip TEST BLOOD  SUGARS 3 TIMES DAILY 08/12/16   Philemon Kingdom, MD  insulin NPH Human (NOVOLIN N RELION) 100 UNIT/ML injection Inject 15 units under skin in the AM and 25 units in the evening 11/01/17   Philemon Kingdom, MD  Lancet Devices (EASY TOUCH LANCING DEVICE) MISC Use to check diabetes 4 times daily  E 11.65 08/12/16   Philemon Kingdom, MD  latanoprost (XALATAN) 0.005 % ophthalmic solution INSTILL 1 DROP INTO EACH EYE ONCE DAILY IN THE EVENING 12/02/18   [provider]  metFORMIN (GLUCOPHAGE) 1000 MG tablet TAKE 1 TABLET BY MOUTH TWICE DAILY WITH MEALS 03/28/19   Crecencio Mc, MD  NOVOLIN N RELION 100 UNIT/ML injection INJECT 20 UNITS SUBCUTANEOUSLY IN THE MORNING AND 55 IN THE EVENING 11/07/18   McLean-Scocuzza, Nino Glow, MD  nystatin (NYSTATIN) powder Apply powder to affected area twice daily until rash has resolved 03/28/18   Crecencio Mc, MD  omeprazole (PRILOSEC) 40 MG capsule Take 1 capsule by mouth once daily 05/23/19   Crecencio Mc, MD  oxyCODONE-acetaminophen (PERCOCET/ROXICET) 5-325 MG tablet Take by mouth every 4 (four) hours as needed for severe pain.    [provider]  tiZANidine (ZANAFLEX) 2 MG tablet TAKE 1 TABLET BY MOUTH EVERY 6 HOURS AS NEEDED FOR MUSCLE SPASM 08/23/19   Pete Pelt, PA-C    Physical Exam:  Constitutional: Middle-aged female who appears to be in no acute distress at this time Vitals:   10/08/19 2115 10/08/19 2119 10/09/19 0245 10/09/19 0522  BP:  119/64 130/71 (!) 155/83  Pulse:  85 84 77  Resp:  18 19 16   Temp:  98.3 F (36.8 C)  98 F (36.7 C)  TempSrc:  Oral  Oral  SpO2:  98% 100% 97%  Weight: 86 kg     Height: 5\' 4"  (1.626 m)      Eyes: PERRL, lids and conjunctivae normal ENMT: Mucous membranes are moist. Posterior pharynx clear of any exudate or lesions. .  Neck: normal, supple, no masses, no thyromegaly Respiratory: clear to auscultation bilaterally, no wheezing, no crackles. Normal respiratory effort. No accessory muscle  use.  Cardiovascular: Regular rate and rhythm, no murmurs / rubs / gallops. No extremity edema. 2+ pedal pulses. No carotid bruits.  Abdomen: no tenderness, no masses palpated. No hepatosplenomegaly. Bowel sounds positive.  Musculoskeletal: no clubbing / cyanosis. No joint deformity upper and lower extremities. Good ROM, no contractures. Normal muscle tone.  Skin: no rashes, lesions, ulcers. No induration Neurologic: CN 2-12 grossly intact. Sensation intact, DTR normal. Strength 5/5 in all 4.  Psychiatric: Normal judgment and insight. Alert and oriented x 3.  Depressed mood.     Labs on Admission: I have personally reviewed following labs and imaging studies  CBC: Recent Labs  Lab 10/08/19 2123  WBC 11.3*  HGB 12.3  HCT 38.6  MCV 92.1  PLT [id number]   Basic Metabolic Panel: Recent Labs  Lab 10/08/19 2123  NA 140  K 3.7  CL 106  CO2 24  GLUCOSE 245*  BUN 11  CREATININE 0.73  CALCIUM 9.2   GFR: Estimated Creatinine Clearance: 80.3 mL/min (by C-G formula based on SCr of 0.73 mg/dL). Liver Function Tests: No results for input(s): AST, ALT, ALKPHOS, BILITOT, PROT, ALBUMIN in the last 168 hours. No results for input(s): LIPASE, AMYLASE in the last 168 hours. No results for input(s): AMMONIA in the last 168 hours. Coagulation Profile: No results for input(s): INR, PROTIME in the last 168 hours. Cardiac Enzymes: No results for input(s): CKTOTAL, CKMB, CKMBINDEX, TROPONINI in the last 168 hours. BNP (last 3 results) No results for input(s): PROBNP in the last 8760 hours. HbA1C: No results for input(s): HGBA1C in the last 72 hours. CBG: No results for input(s): GLUCAP in the last 168 hours. Lipid Profile: No results for input(s): CHOL, HDL, LDLCALC, TRIG, CHOLHDL, LDLDIRECT in the last 72 hours. Thyroid Function Tests: No results for input(s): TSH, T4TOTAL, FREET4, T3FREE, THYROIDAB in the last 72 hours. Anemia Panel: No results for input(s): VITAMINB12, FOLATE, FERRITIN,  TIBC, IRON, RETICCTPCT in the last 72 hours. Urine analysis:    Component Value Date/Time   BILIRUBINUR negative 06/27/2018 1700   PROTEINUR Negative 06/27/2018 1700   UROBILINOGEN 0.2 06/27/2018 1700   NITRITE negative 06/27/2018 1700   LEUKOCYTESUR Negative 06/27/2018 1700   Sepsis Labs: No results found for this or any previous visit (from the past 240 hour(s)).   Radiological Exams on Admission: Dg Chest 2 View  Result Date: 10/08/2019 CLINICAL DATA:  Chest pain EXAM: CHEST - 2 VIEW COMPARISON:  12/31/2017 FINDINGS: The heart size and mediastinal contours are within normal limits. Both lungs are clear. The visualized skeletal structures are unremarkable. IMPRESSION: No active cardiopulmonary disease. Electronically Signed   By: Inez Catalina M.D.   On: 10/08/2019 21:52    EKG: Independently reviewed.  Sinus rhythm 83 bpm with flipped T waves in lead III  Assessment/Plan Chest pain, elevated troponin: Acute.  Patient presents with complaints of right-sided chest pain that radiates across her chest and into her shoulders and neck.  Troponins noted to be slowly trending up 28-> 38-> 51.  EKG was noted to have new changes in inferior lateral leads.  Question -Admit to a medical telemetry bed  -N.p.o. -Continue to trend troponin -Check lipid panel -Check echocardiogram -Dr. Einar Gip of cardiology consulted.  Plan for cardiac cath later this afternoon  Diabetes mellitus type 2, uncontrolled with hyperglycemia: Last hemoglobin A1c 7.6 on 08/31/2019.  Patient reportedly taking Novolin 20 units in the morning and 55 units in the evening with meals.  -Hypoglycemic protocols -Hold Metformin -CBGs with moderate SSI -Adjust insulin regimen as needed  Hyperlipidemia: Lipid panel revealed LDL of 107, HDL 39, and triglycerides 132. -Continue atorvastatin  Obesity: BMI 32.54 kg/m  DVT prophylaxis: Lovenox Code Status: Full Family Communication: Discussed plan of care with the patient's  daughter who is present at bedside Disposition Plan: Possible discharge home in 1 to 2 days depending on findings Consults called: Cardiology Admission status: observation  Norval Morton MD [facility] Pager [phone]   If 7PM-7AM, please contact night-coverage [email or web address] Password [facility]  10/09/2019, 8:48 AM

## 2019-10-09 NOTE — ED Notes (Signed)
ED TO INPATIENT HANDOFF REPORT  ED Nurse Name and Phone #: Celene Squibb RN  S Name/Age/Gender [name] 58 y.o. female Room/Bed: 028C/028C  Code Status   Code Status: Full Code  Home/SNF/Other Home Patient oriented to: self, place, time and situation Is this baseline? Yes   Triage Complete: Triage complete  Chief Complaint chest pain, neck pain  Triage Note Pt c/o midsternal CP today intermittent, more complaining of pain to neck radiating down R arm x 3 months, pt states worse today.    Allergies Allergies  Allergen Reactions  . Oxycodone-Acetaminophen Other (See Comments)    Causes her to feel "hot".  . Penicillins Rash    Did it involve swelling of the face/tongue/throat, SOB, or low BP? No Did it involve sudden or severe rash/hives, skin peeling, or any reaction on the inside of your mouth or nose? No Did you need to seek medical attention at a hospital or doctor's office? No When did it last happen? If all above answers are "NO", may proceed with cephalosporin use.    Level of Care/Admitting Diagnosis ED Disposition    ED Disposition Condition Comment   Admit  Hospital Area: [facility] [[id number]]  Level of Care: Telemetry Cardiac [103]  I expect the patient will be discharged within 24 hours: No (not a candidate for 5C-Observation unit)  Covid Evaluation: Asymptomatic Screening Protocol (No Symptoms)  Diagnosis: Chest pain [id number]  Admitting Physician: Norval Morton [id number]  Attending Physician: Norval Morton [id number]  PT Class (Do Not Modify): Observation [104]  PT Acc Code (Do Not Modify): Observation [10022]       B Medical/Surgery History Past Medical History:  Diagnosis Date  . Arthritis   . Campylobacter enteritis May 2012   [facility] admission  . Carpal tunnel syndrome, bilateral   . Diabetes mellitus   . Gastritis with bleeding due to alcohol 2010   [facility] admission  . GERD (gastroesophageal reflux disease)    . Hyperlipidemia   . Hypertension    no meds  . Neck pain   . Wears glasses    Past Surgical History:  Procedure Laterality Date  . CARPAL TUNNEL RELEASE Left 05/14/2014   Procedure: LEFT ULNAR NEUROPLASTY AT ELBOW AND ENDOSCOPIC CARPAL TUNNEL RELEASE;  Surgeon: Jolyn Nap, MD;  Location: [place];  Service: Orthopedics;  Laterality: Left;  . CYST EXCISION  1995   under arms  . endometrial biopsy  2004   benign  . FOOT OSTEOTOMY  2010   right-spurs  . TONSILLECTOMY    . ULNAR NERVE TRANSPOSITION Left 05/14/2014   Procedure: ULNAR NERVE DECOMPRESSION/TRANSPOSITION;  Surgeon: Jolyn Nap, MD;  Location: [place];  Service: Orthopedics;  Laterality: Left;     A IV Location/Drains/Wounds Patient Lines/Drains/Airways Status   Active Line/Drains/Airways    Name:   Placement date:   Placement time:   Site:   Days:   Peripheral IV 10/09/19 Left Forearm   10/09/19    0834    Forearm   less than 1   Closed System Drain 1 Left Hand Other (Comment) 10 Fr.   05/14/14    0834    Hand   1974   Incision (Closed) 05/14/14 Hand Left   05/14/14    0820     1974   Incision (Closed) 05/14/14 Arm Left   05/14/14    0820     1974  Intake/Output Last 24 hours No intake or output data in the 24 hours ending 10/09/19 1106  Labs/Imaging Results for orders placed or performed during the hospital encounter of 10/08/19 (from the past 48 hour(s))  Basic metabolic panel     Status: Abnormal   Collection Time: 10/08/19  9:23 PM  Result Value Ref Range   Sodium 140 135 - 145 mmol/L   Potassium 3.7 3.5 - 5.1 mmol/L   Chloride 106 98 - 111 mmol/L   CO2 24 22 - 32 mmol/L   Glucose, Bld 245 (H) 70 - 99 mg/dL   BUN 11 6 - 20 mg/dL   Creatinine, Ser 0.73 0.44 - 1.00 mg/dL   Calcium 9.2 8.9 - 10.3 mg/dL   GFR calc non Af Amer >60 >60 mL/min   GFR calc Af Amer >60 >60 mL/min   Anion gap 10 5 - 15    Comment: Performed at [facility], [place] [address] [place], [place] [address]  CBC     Status: Abnormal   Collection Time: 10/08/19  9:23 PM  Result Value Ref Range   WBC 11.3 (H) 4.0 - 10.5 K/uL   RBC 4.19 3.87 - 5.11 MIL/uL   Hemoglobin 12.3 12.0 - 15.0 g/dL   HCT 38.6 36.0 - 46.0 %   MCV 92.1 80.0 - 100.0 fL   MCH 29.4 26.0 - 34.0 pg   MCHC 31.9 30.0 - 36.0 g/dL   RDW 12.4 11.5 - 15.5 %   Platelets 347 150 - 400 K/uL   nRBC 0.0 0.0 - 0.2 %    Comment: Performed at [facility], [place] [address] [place], [place] [address]  Troponin I (High Sensitivity)     Status: Abnormal   Collection Time: 10/08/19  9:23 PM  Result Value Ref Range   Troponin I (High Sensitivity) 28 (H) <18 ng/L    Comment: (NOTE) Elevated high sensitivity troponin I (hsTnI) values and significant  changes across serial measurements may suggest ACS but many other  chronic and acute conditions are known to elevate hsTnI results.  Refer to the "Links" section for chest pain algorithms and additional  guidance. Performed at [facility], [place] [address] [place], [place] [address]   Troponin I (High Sensitivity)     Status: Abnormal   Collection Time: 10/08/19 11:52 PM  Result Value Ref Range   Troponin I (High Sensitivity) 38 (H) <18 ng/L    Comment: (NOTE) Elevated high sensitivity troponin I (hsTnI) values and significant  changes across serial measurements may suggest ACS but many other  chronic and acute conditions are known to elevate hsTnI results.  Refer to the "Links" section for chest pain algorithms and additional  guidance. Performed at [facility], [place] [address] [place], [place] [address]   I-Stat beta hCG blood, ED     Status: None   Collection Time: 10/09/19 12:34 AM  Result Value Ref Range   I-stat hCG, quantitative <5.0 <5 mIU/mL   Comment 3            Comment:   GEST. AGE      CONC.  (mIU/mL)   <=1 WEEK        5 - 50     2 WEEKS       50 - 500     3 WEEKS       100 - 10,000     4 WEEKS     1,000 -  30,000         FEMALE AND NON-PREGNANT FEMALE:     LESS THAN 5 mIU/mL    Dg Chest 2 View  Result Date: 10/08/2019 CLINICAL DATA:  Chest pain EXAM: CHEST - 2 VIEW COMPARISON:  12/31/2017 FINDINGS: The heart size and mediastinal contours are within normal limits. Both lungs are clear. The visualized skeletal structures are unremarkable. IMPRESSION: No active cardiopulmonary disease. Electronically Signed   By: Inez Catalina M.D.   On: 10/08/2019 21:52    Pending Labs Unresulted Labs (From admission, onward)    Start     Ordered   10/10/19 0500  CBC with Differential/Platelet  Tomorrow morning,   R     10/09/19 1003   10/10/19 [id number]  Basic metabolic panel  Tomorrow morning,   R     10/09/19 1003   10/09/19 1000  HIV Antibody (routine testing w rflx)  (HIV Antibody (Routine testing w reflex) panel)  Once,   STAT     10/09/19 1003   10/09/19 1000  Lipid panel  Once,   R     10/09/19 1000   10/09/19 0838  SARS CORONAVIRUS 2 (TAT 6-24 HRS) Nasopharyngeal Nasopharyngeal Swab  (Asymptomatic/Tier 2 Patients Labs)  Once,   STAT    Question Answer Comment  Is this test for diagnosis or screening Screening   Symptomatic for COVID-19 as defined by CDC No   Hospitalized for COVID-19 No   Admitted to ICU for COVID-19 No   Previously tested for COVID-19 No   Resident in a congregate (group) care setting No   Employed in healthcare setting No   Pregnant No      10/09/19 0837          Vitals/Pain Today's Vitals   10/09/19 0900 10/09/19 0915 10/09/19 0930 10/09/19 1053  BP: (!) 166/89 (!) 154/94 (!) 158/77   Pulse: 84 79    Resp: 13 19 16    Temp:      TempSrc:      SpO2: 99% 97%    Weight:      Height:      PainSc:    8     Isolation Precautions No active isolations  Medications Medications  acetaminophen (TYLENOL) tablet 650 mg (has no administration in time range)  ondansetron (ZOFRAN) injection 4 mg (has no administration in time range)  enoxaparin (LOVENOX) injection 40 mg (has no  administration in time range)  alum & mag hydroxide-simeth (MAALOX/MYLANTA) 200-200-20 MG/5ML suspension 30 mL (has no administration in time range)  nitroGLYCERIN (NITROSTAT) SL tablet 0.4 mg (has no administration in time range)  sodium chloride flush (NS) 0.9 % injection 3 mL (3 mLs Intravenous Given 10/09/19 0845)  fentaNYL (SUBLIMAZE) injection 50 mcg (50 mcg Intravenous Given 10/09/19 0843)  aspirin chewable tablet 324 mg (324 mg Oral Given 10/09/19 [id number])    Mobility walks Low fall risk   Focused Assessments Cardiac Assessment Handoff:    No results found for: CKTOTAL, CKMB, CKMBINDEX, TROPONINI Lab Results  Component Value Date   DDIMER 0.51 (H) 08/03/2017   Does the Patient currently have chest pain? Yes     R Recommendations: See Admitting Provider Note  Report given to:   Additional Notes:

## 2019-10-09 NOTE — H&P (View-Only) (Signed)
CARDIOLOGY CONSULT NOTE  Patient ID: [name] MRN: [id number] DOB/AGE: [birth year] 59 y.o.  Admit date: 10/08/2019 Referring Physician  Fuller Plan, MD Primary Physician:  Wendy Mc, MD Reason for Consultation  NSTEMI  HPI:    [name]  is a 58 y.o. female  with uncontrolled diabetes mellitus, hyperlipidemia, who has been having exertional chest discomfort and dyspnea over the past 2 to 3 months, has been worsening gradually.  Chest pain described as tightness sometimes sharp in the middle of the chest, radiating to her right shoulder and also to her right arm and also to the right side of her neck.  Pain is exacerbated by exertion and improved on rest however patient also complains of pain when she moves her body in a certain way.  No hemoptysis, no leg edema.  Pain is worse yesterday and also felt burning sensation in the chest, presented to the emergency room.  Admitted for further evaluation.  Presently states that still has mild chest discomfort.  Her daughter is present at the bedside.  No PND or orthopnea.  Past Medical History:  Diagnosis Date  . Arthritis   . Campylobacter enteritis May 2012   [facility] admission  . Carpal tunnel syndrome, bilateral   . Diabetes mellitus   . Gastritis with bleeding due to alcohol 2010   [facility] admission  . GERD (gastroesophageal reflux disease)   . Hyperlipidemia   . Hypertension    no meds  . Neck pain   . Wears glasses     Past Surgical History:  Procedure Laterality Date  . CARPAL TUNNEL RELEASE Left 05/14/2014   Procedure: LEFT ULNAR NEUROPLASTY AT ELBOW AND ENDOSCOPIC CARPAL TUNNEL RELEASE;  Surgeon: Wendy Nap, MD;  Location: [place];  Service: Orthopedics;  Laterality: Left;  . CYST EXCISION  1995   under arms  . endometrial biopsy  2004   benign  . FOOT OSTEOTOMY  2010   right-spurs  . TONSILLECTOMY    . ULNAR NERVE TRANSPOSITION Left 05/14/2014   Procedure: ULNAR NERVE  DECOMPRESSION/TRANSPOSITION;  Surgeon: Wendy Nap, MD;  Location: [place];  Service: Orthopedics;  Laterality: Left;    Social History   Socioeconomic History  . Marital status: Single    Spouse name: Not on file  . Number of children: 2  . Years of education: 100  . Highest education level: High school graduate  Occupational History  . Not on file  Social Needs  . Financial resource strain: Not on file  . Food insecurity    Worry: Not on file    Inability: Not on file  . Transportation needs    Medical: Not on file    Non-medical: Not on file  Tobacco Use  . Smoking status: Former Smoker    Quit date: 05/09/2012    Years since quitting: 7.4  . Smokeless tobacco: Never Used  Substance and Sexual Activity  . Alcohol use: Not Currently  . Drug use: Yes    Types: Marijuana    Comment: sometimes  . Sexual activity: Yes    Comment: same partner x 10 years  Lifestyle  . Physical activity    Days per week: Not on file    Minutes per session: Not on file  . Stress: Not on file  Relationships  . Social Herbalist on phone: Not on file    Gets together: Not on file    Attends religious service: Not  on file    Active member of club or organization: Not on file    Attends meetings of clubs or organizations: Not on file    Relationship status: Not on file  . Intimate partner violence    Fear of current or ex partner: Not on file    Emotionally abused: Not on file    Physically abused: Not on file    Forced sexual activity: Not on file  Other Topics Concern  . Not on file  Social History Narrative   She works as a Sports coach.   Right-handed.   One cup caffeine per day.   She lives at home with daughter.     No intake/output data recorded.  No intake/output data recorded.   ROS  Review of Systems  Constitution: Negative for chills, decreased appetite, malaise/fatigue and weight gain.  Cardiovascular: Positive for chest pain and  dyspnea on exertion. Negative for leg swelling and syncope.  Endocrine: Negative for cold intolerance.  Hematologic/Lymphatic: Does not bruise/bleed easily.  Musculoskeletal: Positive for neck pain. Negative for joint swelling.  Gastrointestinal: Negative for abdominal pain, anorexia, change in bowel habit, hematochezia and melena.  Neurological: Negative for headaches and light-headedness.  Psychiatric/Behavioral: Negative for depression and substance abuse.  All other systems reviewed and are negative.   Objective  Blood pressure (!) 145/75, pulse 80, temperature 98 F (36.7 C), temperature source Oral, resp. rate 16, height 5\' 4"  (1.626 m), weight 86 kg, SpO2 100 %. Body mass index is 32.54 kg/m. Vitals with BMI 10/09/2019 10/09/2019 10/09/2019  Height - - -  Weight - - -  BMI - - -  Systolic [id number]  Diastolic 75 77 94  Pulse 80 - 79    Blood pressure (!) 145/75, pulse 80, temperature 98 F (36.7 C), temperature source Oral, resp. rate 16, height 5\' 4"  (1.626 m), weight 86 kg, SpO2 100 %. Body mass index is 32.54 kg/m.  Physical Exam  Constitutional:  She is moderately built and mildly obese in no acute distress.  HENT:  Head: Atraumatic.  Eyes: Conjunctivae are normal.  Neck: Neck supple. No JVD present. No thyromegaly present.  Cardiovascular: Normal rate, regular rhythm, normal heart sounds and intact distal pulses. Exam reveals no gallop.  No murmur heard. Pulses:      Carotid pulses are 2+ on the right side and 2+ on the left side.      Dorsalis pedis pulses are 2+ on the right side and 2+ on the left side.       Posterior tibial pulses are 0 on the right side and 0 on the left side.  No leg edema, no JVD.  Pulmonary/Chest: Effort normal and breath sounds normal.  Abdominal: Soft. Bowel sounds are normal.  Musculoskeletal: Normal range of motion.  Neurological: She is alert.  Skin: Skin is warm and dry.  Psychiatric: She has a normal mood and affect.     Radiology: Dg Chest 2 View  Result Date: 10/08/2019 CLINICAL DATA:  Chest pain EXAM: CHEST - 2 VIEW COMPARISON:  12/31/2017 FINDINGS: The heart size and mediastinal contours are within normal limits. Both lungs are clear. The visualized skeletal structures are unremarkable. IMPRESSION: No active cardiopulmonary disease. Electronically Signed   By: Inez Catalina M.D.   On: 10/08/2019 21:52    Laboratory examination:   Recent Labs    12/21/18 1706 01/19/19 1504 10/08/19 2123  NA 147* 139 140  K 4.2 4.1 3.7  CL 108 103 106  CO2  19* 25 24  GLUCOSE 63* 92 245*  BUN 14 17 11   CREATININE 0.68 0.66 0.73  CALCIUM 9.7 9.6 9.2  GFRNONAA  --   --  >60  GFRAA  --   --  >60   CMP Latest Ref Rng & Units 10/08/2019 01/19/2019 12/21/2018  Glucose 70 - 99 mg/dL 245(H) 92 63(L)  BUN 6 - 20 mg/dL 11 17 14   Creatinine 0.44 - 1.00 mg/dL 0.73 0.66 0.68  Sodium 135 - 145 mmol/L 140 139 147(H)  Potassium 3.5 - 5.1 mmol/L 3.7 4.1 4.2  Chloride 98 - 111 mmol/L 106 103 108  CO2 22 - 32 mmol/L 24 25 19(L)  Calcium 8.9 - 10.3 mg/dL 9.2 9.6 9.7  Total Protein 6.1 - 8.1 g/dL - 6.9 7.7  Total Bilirubin 0.2 - 1.2 mg/dL - 0.3 0.4  Alkaline Phos 39 - 117 U/L - - -  AST 10 - 35 U/L - 17 15  ALT 6 - 29 U/L - 17 14   CBC Latest Ref Rng & Units 10/08/2019 01/19/2019 12/21/2018  WBC 4.0 - 10.5 K/uL 11.3(H) 10.2 10.2  Hemoglobin 12.0 - 15.0 g/dL 12.3 12.8 12.8  Hematocrit 36.0 - 46.0 % 38.6 38.0 38.9  Platelets 150 - 400 K/uL 347 381 350   Lipid Panel     Component Value Date/Time   CHOL 172 10/09/2019 1000   TRIG 132 10/09/2019 1000   HDL 39 (L) 10/09/2019 1000   CHOLHDL 4.4 10/09/2019 1000   VLDL 26 10/09/2019 1000   LDLCALC 107 (H) 10/09/2019 1000   LDLCALC 150 (H) 12/21/2018 1706   LDLDIRECT 94.0 09/25/2015 0956   HEMOGLOBIN A1C Lab Results  Component Value Date   HGBA1C 7.6 (A) 08/31/2019   TSH No results for input(s): TSH in the last 8760 hours. Medications   Prior to Admission medications    Medication Sig Start Date End Date Taking? Authorizing Provider  atorvastatin (LIPITOR) 40 MG tablet Take 1 tablet by mouth once daily 07/21/19  Yes Wendy Mc, MD  gabapentin (NEURONTIN) 300 MG capsule Take 1 capsule (300 mg total) by mouth 3 (three) times daily. 08/29/19  Yes Marcial Pacas, MD  insulin regular (NOVOLIN R) 100 units/mL injection Inject 10-50 Units into the skin daily. Patient is mixing with Novolin N to dose twice daily   Yes [provider]  latanoprost (XALATAN) 0.005 % ophthalmic solution Place 1 drop into both eyes at bedtime.  12/02/18  Yes [provider]  metFORMIN (GLUCOPHAGE) 1000 MG tablet TAKE 1 TABLET BY MOUTH TWICE DAILY WITH MEALS 03/28/19  Yes Wendy Mc, MD  Multiple Vitamins-Minerals (MULTIVITAMIN WITH MINERALS) tablet Take 1 tablet by mouth daily.   Yes [provider]  NOVOLIN N RELION 100 UNIT/ML injection INJECT 20 UNITS SUBCUTANEOUSLY IN THE MORNING AND 55 IN THE EVENING Patient taking differently: Inject 20-55 Units into the skin 2 (two) times daily before a meal.  11/07/18  Yes McLean-Scocuzza, Nino Glow, MD  nystatin (NYSTATIN) powder Apply powder to affected area twice daily until rash has resolved 03/28/18  Yes Wendy Mc, MD  omeprazole (PRILOSEC) 40 MG capsule Take 1 capsule by mouth once daily 05/23/19  Yes Wendy Mc, MD  oxyCODONE-acetaminophen (PERCOCET) 7.5-325 MG tablet Take 1 tablet by mouth 4 (four) times daily as needed. 10/07/19  Yes [provider]  tiZANidine (ZANAFLEX) 2 MG tablet TAKE 1 TABLET BY MOUTH EVERY 6 HOURS AS NEEDED FOR MUSCLE SPASM 08/23/19  Yes Pete Pelt,  PA-C  vitamin B-12 (CYANOCOBALAMIN) 1000 MCG tablet Take 1,000 mcg by mouth daily.   Yes [provider]  Continuous Blood Gluc Sensor (FREESTYLE LIBRE 14 DAY SENSOR) MISC 1 each by Does not apply route every 14 (fourteen) days. Change every 2 weeks 11/01/17   Philemon Kingdom, MD  glucose blood (BAYER CONTOUR TEST) test  strip TEST BLOOD SUGARS 3 TIMES DAILY 08/12/16   Philemon Kingdom, MD  insulin NPH Human (NOVOLIN N RELION) 100 UNIT/ML injection Inject 15 units under skin in the AM and 25 units in the evening Patient not taking: Reported on 10/09/2019 11/01/17   Philemon Kingdom, MD  Lancet Devices (EASY TOUCH LANCING DEVICE) [place] Use to check diabetes 4 times daily  E 11.65 08/12/16   Philemon Kingdom, MD     Current Outpatient Medications  Medication Instructions  . atorvastatin (LIPITOR) 40 MG tablet Take 1 tablet by mouth once daily  . Continuous Blood Gluc Sensor (FREESTYLE LIBRE 14 DAY SENSOR) MISC 1 each, Does not apply, Every 14 days, Change every 2 weeks  . gabapentin (NEURONTIN) 300 mg, Oral, 3 times daily  . glucose blood (BAYER CONTOUR TEST) test strip TEST BLOOD SUGARS 3 TIMES DAILY  . insulin NPH Human (NOVOLIN N RELION) 100 UNIT/ML injection Inject 15 units under skin in the AM and 25 units in the evening  . insulin regular (NOVOLIN R) 10-50 Units, Subcutaneous, Daily, Patient is mixing with Novolin N to dose twice daily  . Lancet Devices (EASY TOUCH LANCING DEVICE) MISC Use to check diabetes 4 times daily  E 11.65  . latanoprost (XALATAN) 0.005 % ophthalmic solution 1 drop, Both Eyes, Daily at bedtime  . metFORMIN (GLUCOPHAGE) 1000 MG tablet TAKE 1 TABLET BY MOUTH TWICE DAILY WITH MEALS  . Multiple Vitamins-Minerals (MULTIVITAMIN WITH MINERALS) tablet 1 tablet, Oral, Daily  . NOVOLIN N RELION 100 UNIT/ML injection INJECT 20 UNITS SUBCUTANEOUSLY IN THE MORNING AND 46 IN THE EVENING  . nystatin (NYSTATIN) powder Apply powder to affected area twice daily until rash has resolved  . omeprazole (PRILOSEC) 40 MG capsule Take 1 capsule by mouth once daily  . oxyCODONE-acetaminophen (PERCOCET) 7.5-325 MG tablet 1 tablet, Oral, 4 times daily PRN  . tiZANidine (ZANAFLEX) 2 MG tablet TAKE 1 TABLET BY MOUTH EVERY 6 HOURS AS NEEDED FOR MUSCLE SPASM  . vitamin B-12 (CYANOCOBALAMIN) 1,000 mcg, Oral, Daily     Cardiac Studies:   Echo pending  Assessment  1.  Non-STEMI 2.  Diabetes mellitus type 2 uncontrolled with hyperglycemia 3.  Hypercholesterolemia  EKG 10/08/2019: Normal sinus rhythm, inferior T wave and lateral T wave abnormality, cannot exclude ischemia.  New compared to previous.  Recommendations:   Patient with non-STEMI with elevated high sensitive serum troponin and also abnormal EKG in the inferior and lateral leads that is new.  Given her risk factors she will need cardiac catheterization.  She still has ongoing chest pain.  Schedule for cardiac catheterization, and possible angioplasty. We discussed regarding risks, benefits, alternatives to this including stress testing, CTA and continued medical therapy. Patient wants to proceed. Understands <1-2% risk of death, stroke, MI, urgent CABG, bleeding, infection, renal failure but not limited to these.   She is not on a beta-blocker, will add metoprolol for now. Continue statin. All questions answered.  Adrian Prows, MD, [facility] 10/09/2019, 1:04 PM [facility]. [place] Pager: [phone] Office: [phone] If no answer Cell [phone]

## 2019-10-09 NOTE — Progress Notes (Signed)
  Echocardiogram 2D Echocardiogram has been performed.  Darlina Sicilian M 10/09/2019, 11:25 AM

## 2019-10-09 NOTE — Interval H&P Note (Signed)
History and Physical Interval Note:  10/09/2019 4:05 PM  [name]  has presented today for surgery, with the diagnosis of n stemi.  The various methods of treatment have been discussed with the patient and family. After consideration of risks, benefits and other options for treatment, the patient has consented to  Procedure(s): LEFT HEART CATH AND CORONARY ANGIOGRAPHY (N/A) and possible angioplasty Cath Lab Visit (complete for each Cath Lab visit)  Clinical Evaluation Leading to the Procedure:   ACS: Yes.    Non-ACS:    Anginal Classification: CCS IV  Anti-ischemic medical therapy: No Therapy  Non-Invasive Test Results: No non-invasive testing performed  Prior CABG: No previous CABG      as a surgical intervention.  The patient's history has been reviewed, patient examined, no change in status, stable for surgery.  I have reviewed the patient's chart and labs.  Questions were answered to the patient's satisfaction.     Adrian Prows

## 2019-10-09 NOTE — Telephone Encounter (Signed)
The patient was last seen here on 09/27/2019 for NCV/EMG study.  After the appt, Dr. Krista Blue released her from neurological care and instructed her to return to her pain management physician.  She informed her further time out of work would need to be discussed with him.  I called the patient back today to review this information.  States she presented to the ED yesterday for chest/neck pain.  She understands that additional paperwork will still need to be addressed by pain management and possibly PCP, if there is a new, non-neurological problem.  She verbalized understanding.

## 2019-10-09 NOTE — Consult Note (Signed)
CARDIOLOGY CONSULT NOTE  Patient ID: [name] MRN: [id number] DOB/AGE: Dec 31, 1960 58 y.o.  Admit date: 10/08/2019 Referring Physician  Fuller Plan, MD Primary Physician:  Crecencio Mc, MD Reason for Consultation  NSTEMI  HPI:    [name]  is a 58 y.o. female  with uncontrolled diabetes mellitus, hyperlipidemia, who has been having exertional chest discomfort and dyspnea over the past 2 to 3 months, has been worsening gradually.  Chest pain described as tightness sometimes sharp in the middle of the chest, radiating to her right shoulder and also to her right arm and also to the right side of her neck.  Pain is exacerbated by exertion and improved on rest however patient also complains of pain when she moves her body in a certain way.  No hemoptysis, no leg edema.  Pain is worse yesterday and also felt burning sensation in the chest, presented to the emergency room.  Admitted for further evaluation.  Presently states that still has mild chest discomfort.  Her daughter is present at the bedside.  No PND or orthopnea.  Past Medical History:  Diagnosis Date  . Arthritis   . Campylobacter enteritis May 2012   [facility] admission  . Carpal tunnel syndrome, bilateral   . Diabetes mellitus   . Gastritis with bleeding due to alcohol 2010   [facility] admission  . GERD (gastroesophageal reflux disease)   . Hyperlipidemia   . Hypertension    no meds  . Neck pain   . Wears glasses     Past Surgical History:  Procedure Laterality Date  . CARPAL TUNNEL RELEASE Left 05/14/2014   Procedure: LEFT ULNAR NEUROPLASTY AT ELBOW AND ENDOSCOPIC CARPAL TUNNEL RELEASE;  Surgeon: Jolyn Nap, MD;  Location: [place];  Service: Orthopedics;  Laterality: Left;  . CYST EXCISION  1995   under arms  . endometrial biopsy  2004   benign  . FOOT OSTEOTOMY  2010   right-spurs  . TONSILLECTOMY    . ULNAR NERVE TRANSPOSITION Left 05/14/2014   Procedure: ULNAR NERVE  DECOMPRESSION/TRANSPOSITION;  Surgeon: Jolyn Nap, MD;  Location: [employer];  Service: Orthopedics;  Laterality: Left;    Social History   Socioeconomic History  . Marital status: Single    Spouse name: Not on file  . Number of children: 2  . Years of education: 59  . Highest education level: High school graduate  Occupational History  . Not on file  Social Needs  . Financial resource strain: Not on file  . Food insecurity    Worry: Not on file    Inability: Not on file  . Transportation needs    Medical: Not on file    Non-medical: Not on file  Tobacco Use  . Smoking status: Former Smoker    Quit date: 05/09/2012    Years since quitting: 7.4  . Smokeless tobacco: Never Used  Substance and Sexual Activity  . Alcohol use: Not Currently  . Drug use: Yes    Types: Marijuana    Comment: sometimes  . Sexual activity: Yes    Comment: same partner x 10 years  Lifestyle  . Physical activity    Days per week: Not on file    Minutes per session: Not on file  . Stress: Not on file  Relationships  . Social Herbalist on phone: Not on file    Gets together: Not on file    Attends religious service: Not  on file    Active member of club or organization: Not on file    Attends meetings of clubs or organizations: Not on file    Relationship status: Not on file  . Intimate partner violence    Fear of current or ex partner: Not on file    Emotionally abused: Not on file    Physically abused: Not on file    Forced sexual activity: Not on file  Other Topics Concern  . Not on file  Social History Narrative   She works as a Sports coach.   Right-handed.   One cup caffeine per day.   She lives at home with daughter.     No intake/output data recorded.  No intake/output data recorded.   ROS  Review of Systems  Constitution: Negative for chills, decreased appetite, malaise/fatigue and weight gain.  Cardiovascular: Positive for chest pain and  dyspnea on exertion. Negative for leg swelling and syncope.  Endocrine: Negative for cold intolerance.  Hematologic/Lymphatic: Does not bruise/bleed easily.  Musculoskeletal: Positive for neck pain. Negative for joint swelling.  Gastrointestinal: Negative for abdominal pain, anorexia, change in bowel habit, hematochezia and melena.  Neurological: Negative for headaches and light-headedness.  Psychiatric/Behavioral: Negative for depression and substance abuse.  All other systems reviewed and are negative.   Objective  Blood pressure (!) 145/75, pulse 80, temperature 98 F (36.7 C), temperature source Oral, resp. rate 16, height 5\' 4"  (1.626 m), weight 86 kg, SpO2 100 %. Body mass index is 32.54 kg/m. Vitals with BMI 10/09/2019 10/09/2019 10/09/2019  Height - - -  Weight - - -  BMI - - -  Systolic [id number]  Diastolic 75 77 94  Pulse 80 - 79    Blood pressure (!) 145/75, pulse 80, temperature 98 F (36.7 C), temperature source Oral, resp. rate 16, height 5\' 4"  (1.626 m), weight 86 kg, SpO2 100 %. Body mass index is 32.54 kg/m.  Physical Exam  Constitutional:  She is moderately built and mildly obese in no acute distress.  HENT:  Head: Atraumatic.  Eyes: Conjunctivae are normal.  Neck: Neck supple. No JVD present. No thyromegaly present.  Cardiovascular: Normal rate, regular rhythm, normal heart sounds and intact distal pulses. Exam reveals no gallop.  No murmur heard. Pulses:      Carotid pulses are 2+ on the right side and 2+ on the left side.      Dorsalis pedis pulses are 2+ on the right side and 2+ on the left side.       Posterior tibial pulses are 0 on the right side and 0 on the left side.  No leg edema, no JVD.  Pulmonary/Chest: Effort normal and breath sounds normal.  Abdominal: Soft. Bowel sounds are normal.  Musculoskeletal: Normal range of motion.  Neurological: She is alert.  Skin: Skin is warm and dry.  Psychiatric: She has a normal mood and affect.     Radiology: Dg Chest 2 View  Result Date: 10/08/2019 CLINICAL DATA:  Chest pain EXAM: CHEST - 2 VIEW COMPARISON:  12/31/2017 FINDINGS: The heart size and mediastinal contours are within normal limits. Both lungs are clear. The visualized skeletal structures are unremarkable. IMPRESSION: No active cardiopulmonary disease. Electronically Signed   By: Inez Catalina M.D.   On: 10/08/2019 21:52    Laboratory examination:   Recent Labs    12/21/18 1706 01/19/19 1504 10/08/19 2123  NA 147* 139 140  K 4.2 4.1 3.7  CL 108 103 106  CO2  19* 25 24  GLUCOSE 63* 92 245*  BUN 14 17 11   CREATININE 0.68 0.66 0.73  CALCIUM 9.7 9.6 9.2  GFRNONAA  --   --  >60  GFRAA  --   --  >60   CMP Latest Ref Rng & Units 10/08/2019 01/19/2019 12/21/2018  Glucose 70 - 99 mg/dL 245(H) 92 63(L)  BUN 6 - 20 mg/dL 11 17 14   Creatinine 0.44 - 1.00 mg/dL 0.73 0.66 0.68  Sodium 135 - 145 mmol/L 140 139 147(H)  Potassium 3.5 - 5.1 mmol/L 3.7 4.1 4.2  Chloride 98 - 111 mmol/L 106 103 108  CO2 22 - 32 mmol/L 24 25 19(L)  Calcium 8.9 - 10.3 mg/dL 9.2 9.6 9.7  Total Protein 6.1 - 8.1 g/dL - 6.9 7.7  Total Bilirubin 0.2 - 1.2 mg/dL - 0.3 0.4  Alkaline Phos 39 - 117 U/L - - -  AST 10 - 35 U/L - 17 15  ALT 6 - 29 U/L - 17 14   CBC Latest Ref Rng & Units 10/08/2019 01/19/2019 12/21/2018  WBC 4.0 - 10.5 K/uL 11.3(H) 10.2 10.2  Hemoglobin 12.0 - 15.0 g/dL 12.3 12.8 12.8  Hematocrit 36.0 - 46.0 % 38.6 38.0 38.9  Platelets 150 - 400 K/uL 347 381 350   Lipid Panel     Component Value Date/Time   CHOL 172 10/09/2019 1000   TRIG 132 10/09/2019 1000   HDL 39 (L) 10/09/2019 1000   CHOLHDL 4.4 10/09/2019 1000   VLDL 26 10/09/2019 1000   LDLCALC 107 (H) 10/09/2019 1000   LDLCALC 150 (H) 12/21/2018 1706   LDLDIRECT 94.0 09/25/2015 0956   HEMOGLOBIN A1C Lab Results  Component Value Date   HGBA1C 7.6 (A) 08/31/2019   TSH No results for input(s): TSH in the last 8760 hours. Medications   Prior to Admission medications    Medication Sig Start Date End Date Taking? Authorizing Provider  atorvastatin (LIPITOR) 40 MG tablet Take 1 tablet by mouth once daily 07/21/19  Yes Crecencio Mc, MD  gabapentin (NEURONTIN) 300 MG capsule Take 1 capsule (300 mg total) by mouth 3 (three) times daily. 08/29/19  Yes Marcial Pacas, MD  insulin regular (NOVOLIN R) 100 units/mL injection Inject 10-50 Units into the skin daily. Patient is mixing with Novolin N to dose twice daily   Yes [provider]  latanoprost (XALATAN) 0.005 % ophthalmic solution Place 1 drop into both eyes at bedtime.  12/02/18  Yes [provider]  metFORMIN (GLUCOPHAGE) 1000 MG tablet TAKE 1 TABLET BY MOUTH TWICE DAILY WITH MEALS 03/28/19  Yes Crecencio Mc, MD  Multiple Vitamins-Minerals (MULTIVITAMIN WITH MINERALS) tablet Take 1 tablet by mouth daily.   Yes [provider]  NOVOLIN N RELION 100 UNIT/ML injection INJECT 20 UNITS SUBCUTANEOUSLY IN THE MORNING AND 55 IN THE EVENING Patient taking differently: Inject 20-55 Units into the skin 2 (two) times daily before a meal.  11/07/18  Yes McLean-Scocuzza, Nino Glow, MD  nystatin (NYSTATIN) powder Apply powder to affected area twice daily until rash has resolved 03/28/18  Yes Crecencio Mc, MD  omeprazole (PRILOSEC) 40 MG capsule Take 1 capsule by mouth once daily 05/23/19  Yes Crecencio Mc, MD  oxyCODONE-acetaminophen (PERCOCET) 7.5-325 MG tablet Take 1 tablet by mouth 4 (four) times daily as needed. 10/07/19  Yes [provider]  tiZANidine (ZANAFLEX) 2 MG tablet TAKE 1 TABLET BY MOUTH EVERY 6 HOURS AS NEEDED FOR MUSCLE SPASM 08/23/19  Yes Pete Pelt,  PA-C  vitamin B-12 (CYANOCOBALAMIN) 1000 MCG tablet Take 1,000 mcg by mouth daily.   Yes [provider]  Continuous Blood Gluc Sensor (FREESTYLE LIBRE 14 DAY SENSOR) MISC 1 each by Does not apply route every 14 (fourteen) days. Change every 2 weeks 11/01/17   Philemon Kingdom, MD  glucose blood (BAYER CONTOUR TEST) test  strip TEST BLOOD SUGARS 3 TIMES DAILY 08/12/16   Philemon Kingdom, MD  insulin NPH Human (NOVOLIN N RELION) 100 UNIT/ML injection Inject 15 units under skin in the AM and 25 units in the evening Patient not taking: Reported on 10/09/2019 11/01/17   Philemon Kingdom, MD  Lancet Devices (EASY TOUCH LANCING DEVICE) [place] Use to check diabetes 4 times daily  E 11.65 08/12/16   Philemon Kingdom, MD     Current Outpatient Medications  Medication Instructions  . atorvastatin (LIPITOR) 40 MG tablet Take 1 tablet by mouth once daily  . Continuous Blood Gluc Sensor (FREESTYLE LIBRE 14 DAY SENSOR) MISC 1 each, Does not apply, Every 14 days, Change every 2 weeks  . gabapentin (NEURONTIN) 300 mg, Oral, 3 times daily  . glucose blood (BAYER CONTOUR TEST) test strip TEST BLOOD SUGARS 3 TIMES DAILY  . insulin NPH Human (NOVOLIN N RELION) 100 UNIT/ML injection Inject 15 units under skin in the AM and 25 units in the evening  . insulin regular (NOVOLIN R) 10-50 Units, Subcutaneous, Daily, Patient is mixing with Novolin N to dose twice daily  . Lancet Devices (EASY TOUCH LANCING DEVICE) MISC Use to check diabetes 4 times daily  E 11.65  . latanoprost (XALATAN) 0.005 % ophthalmic solution 1 drop, Both Eyes, Daily at bedtime  . metFORMIN (GLUCOPHAGE) 1000 MG tablet TAKE 1 TABLET BY MOUTH TWICE DAILY WITH MEALS  . Multiple Vitamins-Minerals (MULTIVITAMIN WITH MINERALS) tablet 1 tablet, Oral, Daily  . NOVOLIN N RELION 100 UNIT/ML injection INJECT 20 UNITS SUBCUTANEOUSLY IN THE MORNING AND 7 IN THE EVENING  . nystatin (NYSTATIN) powder Apply powder to affected area twice daily until rash has resolved  . omeprazole (PRILOSEC) 40 MG capsule Take 1 capsule by mouth once daily  . oxyCODONE-acetaminophen (PERCOCET) 7.5-325 MG tablet 1 tablet, Oral, 4 times daily PRN  . tiZANidine (ZANAFLEX) 2 MG tablet TAKE 1 TABLET BY MOUTH EVERY 6 HOURS AS NEEDED FOR MUSCLE SPASM  . vitamin B-12 (CYANOCOBALAMIN) 1,000 mcg, Oral, Daily     Cardiac Studies:   Echo pending  Assessment  1.  Non-STEMI 2.  Diabetes mellitus type 2 uncontrolled with hyperglycemia 3.  Hypercholesterolemia  EKG 10/08/2019: Normal sinus rhythm, inferior T wave and lateral T wave abnormality, cannot exclude ischemia.  New compared to previous.  Recommendations:   Patient with non-STEMI with elevated high sensitive serum troponin and also abnormal EKG in the inferior and lateral leads that is new.  Given her risk factors she will need cardiac catheterization.  She still has ongoing chest pain.  Schedule for cardiac catheterization, and possible angioplasty. We discussed regarding risks, benefits, alternatives to this including stress testing, CTA and continued medical therapy. Patient wants to proceed. Understands <1-2% risk of death, stroke, MI, urgent CABG, bleeding, infection, renal failure but not limited to these.   She is not on a beta-blocker, will add metoprolol for now. Continue statin. All questions answered.  Adrian Prows, MD, [facility] 10/09/2019, 1:04 PM [facility] [place] Pager: [phone] Office: [phone] If no answer Cell [phone]

## 2019-10-09 NOTE — ED Notes (Signed)
Daughter at bedside, ECHO also here for exam.  Then will transport to floor.

## 2019-10-09 NOTE — Telephone Encounter (Signed)
Pt called, she in the hospital pt need her paperwork updated. Pt need Sharyn Lull to call her HR dept, Please ask for [name] @ [phone] ext [phone]

## 2019-10-10 ENCOUNTER — Encounter (HOSPITAL_COMMUNITY): Payer: Self-pay | Admitting: Cardiology

## 2019-10-10 DIAGNOSIS — Z794 Long term (current) use of insulin: Secondary | ICD-10-CM | POA: Diagnosis not present

## 2019-10-10 DIAGNOSIS — Z833 Family history of diabetes mellitus: Secondary | ICD-10-CM | POA: Diagnosis not present

## 2019-10-10 DIAGNOSIS — Z885 Allergy status to narcotic agent status: Secondary | ICD-10-CM | POA: Diagnosis not present

## 2019-10-10 DIAGNOSIS — E785 Hyperlipidemia, unspecified: Secondary | ICD-10-CM | POA: Diagnosis present

## 2019-10-10 DIAGNOSIS — I214 Non-ST elevation (NSTEMI) myocardial infarction: Secondary | ICD-10-CM | POA: Diagnosis present

## 2019-10-10 DIAGNOSIS — M542 Cervicalgia: Secondary | ICD-10-CM | POA: Diagnosis present

## 2019-10-10 DIAGNOSIS — F1290 Cannabis use, unspecified, uncomplicated: Secondary | ICD-10-CM | POA: Diagnosis present

## 2019-10-10 DIAGNOSIS — E1165 Type 2 diabetes mellitus with hyperglycemia: Secondary | ICD-10-CM | POA: Diagnosis present

## 2019-10-10 DIAGNOSIS — I25110 Atherosclerotic heart disease of native coronary artery with unstable angina pectoris: Secondary | ICD-10-CM | POA: Diagnosis present

## 2019-10-10 DIAGNOSIS — Z713 Dietary counseling and surveillance: Secondary | ICD-10-CM | POA: Diagnosis not present

## 2019-10-10 DIAGNOSIS — Z87891 Personal history of nicotine dependence: Secondary | ICD-10-CM | POA: Diagnosis not present

## 2019-10-10 DIAGNOSIS — Z79899 Other long term (current) drug therapy: Secondary | ICD-10-CM | POA: Diagnosis not present

## 2019-10-10 DIAGNOSIS — Z20828 Contact with and (suspected) exposure to other viral communicable diseases: Secondary | ICD-10-CM | POA: Diagnosis present

## 2019-10-10 DIAGNOSIS — K219 Gastro-esophageal reflux disease without esophagitis: Secondary | ICD-10-CM | POA: Diagnosis present

## 2019-10-10 DIAGNOSIS — I10 Essential (primary) hypertension: Secondary | ICD-10-CM | POA: Diagnosis present

## 2019-10-10 DIAGNOSIS — Z880 Allergy status to penicillin: Secondary | ICD-10-CM | POA: Diagnosis not present

## 2019-10-10 DIAGNOSIS — Z8249 Family history of ischemic heart disease and other diseases of the circulatory system: Secondary | ICD-10-CM | POA: Diagnosis not present

## 2019-10-10 DIAGNOSIS — I2510 Atherosclerotic heart disease of native coronary artery without angina pectoris: Secondary | ICD-10-CM | POA: Diagnosis present

## 2019-10-10 DIAGNOSIS — Z6832 Body mass index (BMI) 32.0-32.9, adult: Secondary | ICD-10-CM | POA: Diagnosis not present

## 2019-10-10 DIAGNOSIS — E1169 Type 2 diabetes mellitus with other specified complication: Secondary | ICD-10-CM | POA: Diagnosis present

## 2019-10-10 DIAGNOSIS — M25511 Pain in right shoulder: Secondary | ICD-10-CM | POA: Diagnosis present

## 2019-10-10 DIAGNOSIS — E669 Obesity, unspecified: Secondary | ICD-10-CM | POA: Diagnosis present

## 2019-10-10 DIAGNOSIS — E11319 Type 2 diabetes mellitus with unspecified diabetic retinopathy without macular edema: Secondary | ICD-10-CM | POA: Diagnosis present

## 2019-10-10 LAB — CBC WITH DIFFERENTIAL/PLATELET
Abs Immature Granulocytes: 0.03 10*3/uL (ref 0.00–0.07)
Basophils Absolute: 0 10*3/uL (ref 0.0–0.1)
Basophils Relative: 0 %
Eosinophils Absolute: 0.2 10*3/uL (ref 0.0–0.5)
Eosinophils Relative: 2 %
HCT: 37 % (ref 36.0–46.0)
Hemoglobin: 12.1 g/dL (ref 12.0–15.0)
Immature Granulocytes: 0 %
Lymphocytes Relative: 35 %
Lymphs Abs: 2.9 10*3/uL (ref 0.7–4.0)
MCH: 29.2 pg (ref 26.0–34.0)
MCHC: 32.7 g/dL (ref 30.0–36.0)
MCV: 89.4 fL (ref 80.0–100.0)
Monocytes Absolute: 0.5 10*3/uL (ref 0.1–1.0)
Monocytes Relative: 6 %
Neutro Abs: 4.6 10*3/uL (ref 1.7–7.7)
Neutrophils Relative %: 57 %
Platelets: 273 10*3/uL (ref 150–400)
RBC: 4.14 MIL/uL (ref 3.87–5.11)
RDW: 12.2 % (ref 11.5–15.5)
WBC: 8.3 10*3/uL (ref 4.0–10.5)
nRBC: 0 % (ref 0.0–0.2)

## 2019-10-10 LAB — COMPREHENSIVE METABOLIC PANEL
ALT: 17 U/L (ref 0–44)
AST: 14 U/L — ABNORMAL LOW (ref 15–41)
Albumin: 3.3 g/dL — ABNORMAL LOW (ref 3.5–5.0)
Alkaline Phosphatase: 89 U/L (ref 38–126)
Anion gap: 9 (ref 5–15)
BUN: 6 mg/dL (ref 6–20)
CO2: 26 mmol/L (ref 22–32)
Calcium: 8.8 mg/dL — ABNORMAL LOW (ref 8.9–10.3)
Chloride: 103 mmol/L (ref 98–111)
Creatinine, Ser: 0.66 mg/dL (ref 0.44–1.00)
GFR calc Af Amer: >60 mL/min (ref >60)
GFR calc non Af Amer: >60 mL/min (ref >60)
Glucose, Bld: 296 mg/dL — ABNORMAL HIGH (ref 70–99)
Potassium: 3.8 mmol/L (ref 3.5–5.1)
Sodium: 138 mmol/L (ref 135–145)
Total Bilirubin: 0.7 mg/dL (ref 0.3–1.2)
Total Protein: 6.2 g/dL — ABNORMAL LOW (ref 6.5–8.1)

## 2019-10-10 LAB — GLUCOSE, CAPILLARY
Glucose-Capillary: 290 mg/dL — ABNORMAL HIGH (ref 70–99)
Glucose-Capillary: 232 mg/dL — ABNORMAL HIGH (ref 70–99)

## 2019-10-10 MED ORDER — STUDY - AEGIS II STUDY - PLACEBO OR CSL112 (PI-HILTY)
170.0000 mL | Freq: Once | INTRAVENOUS | Status: AC
  Administered 2019-10-10: 170 mL via INTRAVENOUS
  Filled 2019-10-10: qty 170

## 2019-10-10 MED ORDER — METOPROLOL SUCCINATE ER 50 MG PO TB24: 50.0000 mg | ORAL_TABLET | Freq: Every day | ORAL | 1 refills | Status: DC

## 2019-10-10 MED ORDER — IRBESARTAN 75 MG PO TABS: 75.0000 mg | ORAL_TABLET | Freq: Every day | ORAL | 1 refills | Status: DC

## 2019-10-10 MED ORDER — INSULIN NPH (HUMAN) (ISOPHANE) 100 UNIT/ML ~~LOC~~ SUSP: 20.0000 [IU] | Freq: Two times a day (BID) | SUBCUTANEOUS | Status: DC

## 2019-10-10 MED ORDER — TICAGRELOR 90 MG PO TABS: 90.0000 mg | ORAL_TABLET | Freq: Two times a day (BID) | ORAL | 1 refills | Status: DC

## 2019-10-10 MED ORDER — ASPIRIN 81 MG PO CHEW: 81.0000 mg | CHEWABLE_TABLET | Freq: Every day | ORAL | Status: DC

## 2019-10-10 MED FILL — BRILINTA 90 MG TABLET: 90 | 30 days supply | Qty: 60 | Fill #0

## 2019-10-10 MED FILL — IRBESARTAN 75 MG TABS: 75 | 30 days supply | Qty: 30 | Fill #0

## 2019-10-10 MED FILL — METOPROLOL SUCCINATE ER 50: 50 | 30 days supply | Qty: 30 | Fill #0

## 2019-10-10 NOTE — Research (Signed)
[facility] Informed Consent   Subject Name: [name]  Subject met inclusion and exclusion criteria.  The informed consent form, study requirements and expectations were reviewed with the subject and questions and concerns were addressed prior to the signing of the consent form.  The subject verbalized understanding of the trial requirements.  The subject agreed to participate in the [facility] trial and signed the informed consent at 0931 on 10/10/2019.  The informed consent was obtained prior to performance of any protocol-specific procedures for the subject.  A copy of the signed informed consent was given to the subject and a copy was placed in the subject's medical record.    Inclusion/Exclusion Checklist:   Inclusions:   Y N   _0  _1  Female or female at least 59 years of age  _2  _3  Evidence of type I (spontaneous) MI (STEMI or NSTEMI) caused by atherothrombotic artery disease as defined by the following:  _4  _5  a. Detection of a rise and/or fall in Troponin I or T with at least 1 value about the 99% upper reference limit.     (AND)---  Any 1 or more of the following:   _6  _7  - symptoms of ischemia (ie, resulting from a primary coronary    artery event)  _8  _9  - New or presumably new significant ST/T wave changes or left bundle branch block.  _10  _11       - Development of pathological Q waves on EKG  _12  _13  - Imaging evidence of new loss or viable myocardium or regional wall motion abnormality.  _14  _15  - ID of intracoronary thrombus by angiography.  _16  _17  No suspicion of acute kidney injury at least 12 hours after angiography OR after first medical contract for subject's not undergoing angiography There must be documented evidence of stable renal function defined as no more than an increase in Serum Creatinine < 0.9m/dl from pre-contrast serum creatinine value.  (Before _____   12 hrs after ________)    Evidence of multi-vessel coronary artery disease defined as:  _18  _19  A. At least 50% stenosis  of theleft main coronary artery or at least 2 epicardial coronary artery territories (LAD, LCx, RCA) on catherization performed during the index hospitalization.   _20  _21  B. Prior cardiac catherization documenting @ least 50% stenosis of the LM or at least 2 epicardial >1 epicardial artery territories (LAD, LCx, RCA)  _22  _23  C. Prior PCI and evidence of 50% stenosis of at least 1 epicardial coronary artery territory different from prior revascularized artery territory.   _24  _25  D. Prior multivessel coronary artery bypass grafting.  _26  _27  Plus either Established risk factors:  _28  _29  o On pharmacological treatment for diabetes mellitus                 OR    TWO of the following  _30  _31  o Prior history of MI  _32  _33  o Age ? 65 years  _34  _35  o Peripheral arterial disease defined as meeting at least 1 of the following criteria:  _36  _37          +   Current intermittent claudication or resting limb ischemia and ABI    ?0.90  _38  _39          +   History of peripheral revascularization (surgical or percutaneous)  _40  _41          +  History of limb amputation due to PAD  _42  _43          +  Angiographic evidence (using computed tomographic angiography, MRA, or invasive angiography or a peripheral artery stenosis ?50%.  _0  _1  If the female subject without child bearing potential, not breastfeeding, not pregnant, and if of child bearing potential agree to contraception or lifestyle methods to avoid pregnancy? Child-bearing potential (must select all)   ____ not pregnant (by urine or serum hCG AND   ____ willing to use an acceptable method of contraception to avoid pregnancy during the study and for 3 months after last dose of investional product (refer to acceptable methods per protocol)   ____ if breastfeeding, willing to cease breastfeeding Date of pregnancy test: (____ /_____/ ________)  Result  - or + Not of Child bearing potential (select one)   ____ Age >= 73   ____ Age 38-60 with amenorrhea for at least 1 year  with documented evidence of follicle-stimulating hormone level >40 IU/L   ____ Surgically sterile for at least 3 months prior to randomization  _2  _3  Investigator believes that the subject is willing and able to adhere to all protocol requirements.   _4  _5  Willing to not participate in another investigational study until completion of their final study visit.     Exclusions:  Y N   _6  _7  If these are the reason for MI (pt is excluded)  _8  _9  1. Myocardial necrosis due mismatch between myocardial oxygen demand and supply, usually due to fixed coronary disease with increased demand leading to MI  _10  _11  2. Cardiac death due to MI  _12  _13  3. Myocardial necrosis due to complications from a PCI  _14  _15  4. Myocardial necrosis due to stent thrombosis  _16  _17  5. Myocardial necrosis due to in stent restenosis as the only etiology  _18  _19  6. Myocardial necrosis in the stenting of coronary artery bypass grafting  _20  _21  Ongoing hemodynamic instability  _22  _23       +  History of NYHA Class III or IV heart failure within the last year  _24  _25       +  [place] Class III or IV heart failure  _26  _27       +  Sustained and/or symptomatic hypotension (SBP <90 mm HG)  _28  _29       +  Known left ventricular ejection fraction of <30%  _30  _31  Evidence of hepatobiliary disease as indicated by any 1 or more of the   following at screening:  _32  _33       +  Current active hepatic dysfunction or active biliary obstruction  _34  _35       +       +  Chronic or prior history of cirrhosis or of infectious / inflammatory hepatitis NOTE: If a patient has a medical history of recovered Hep A, B, or C without evidence of cirrhosis, he/she could be considered for inclusion if there is documented evidence that there is no active infection (ie, antigen negative)  _36  _37       + Hepatic lab abnormalities: ALT > 3 x ULN or Total bilirubin > 2x ULN at randomization.  _38  _39  Severe chronic kidney disease (eGFR of <52m) or on dialysis   _40  _41  Plan to undergo scheduled coronary artery bypass graft surgery after randomization, as determined at the time of screening  _42  _43  Known history of allergies to soybeans, peanuts, albumin  _44  _45  Body weight <50 kg  _46  _47  A known history of IgA deficiency or antibodies to IgA  _48  _49  A comorbid condition with an estimated life expectancy  of ? 6 months at time of consent  _0  _1  Women who are pregnant or breastfeeding at time of randomization  _2  _3  Participated in another interventional clinical study at the time of consent  _4  _5  Treatment with anticancer therapy  _6  _7  Previously randomized or participated in this study or previously exposed to [facility]

## 2019-10-10 NOTE — Progress Notes (Signed)
TR BAND REMOVAL  LOCATION:    right radial  DEFLATED PER PROTOCOL:    Yes.    TIME BAND OFF / DRESSING APPLIED:    2140   SITE UPON ARRIVAL:    Level 0  SITE AFTER BAND REMOVAL:    Level 0  CIRCULATION SENSATION AND MOVEMENT:    Within Normal Limits   Yes.    COMMENTS:   Post TR band instructions given, sterile dressing applied, good capillary refill.

## 2019-10-10 NOTE — TOC Benefit Eligibility Note (Signed)
Transition of Care (TOC) Benefit Eligibility Note    Patient Details  Name: Adylin C Gerry MRN: [id number] Date of Birth: 09/25/1961   Medication/Dose: BRILINTA tablet 90 mg  :  Dose 90 mg  :  Oral  :  2 times daily  Covered?: Yes  Tier: (1)  Prescription Coverage Preferred Pharmacy: [facility] or [facility]  Spoke with Person/Company/Phone Number:: Brittany/ [facility] [employer]/ [phone]  Co-Pay: 30.00 for a 30 day supply 90.00 for 90 day supply Mail Order or Retail  Prior Approval: No  Deductible: Met       Iris Bratton Phone Number: 10/10/2019, 10:15 AM     

## 2019-10-10 NOTE — Progress Notes (Signed)
Subjective:  No further chest tightness. Still has right shoulder pain with movement.   Intake/Output from previous day:  I/O last 3 completed shifts: In: 1475.5 [P.O.:480; I.V.:995.5] Out: 1200 [Urine:1200] No intake/output data recorded.  Blood pressure 125/83, pulse 80, temperature 98.1 F (36.7 C), temperature source Oral, resp. rate 16, height _0  (1.626 m), weight 85.8 kg, SpO2 100 %. Body mass index is 32.46 kg/m.   Physical Exam  Constitutional:  [name] is moderately built and mildly obese in no acute distress.  HENT:  Head: Atraumatic.  Eyes: Conjunctivae are normal.  Neck: Neck supple. No JVD present. No thyromegaly present.  Cardiovascular: Normal rate, regular rhythm, normal heart sounds and intact distal pulses. Exam reveals no gallop.  No murmur heard. Pulses:      Carotid pulses are 2+ on the right side and 2+ on the left side.      Femoral pulses are 2+ on the right side and 2+ on the left side.      Dorsalis pedis pulses are 1+ on the right side and 1+ on the left side.       Posterior tibial pulses are 1+ on the right side and 1+ on the left side.  No leg edema, no JVD.  Pulmonary/Chest: Effort normal and breath sounds normal.  Abdominal: Soft. Bowel sounds are normal.  Musculoskeletal: Normal range of motion.  Neurological: [name] is alert.  Skin: Skin is warm and dry.  Psychiatric: [name] has a normal mood and affect.   Lab Results: BMP BNP (last 3 results) No results for input(s): BNP in the last 8760 hours.  ProBNP (last 3 results) No results for input(s): PROBNP in the last 8760 hours. BMP Latest Ref Rng & Units 10/10/2019 10/08/2019 01/19/2019  Glucose 70 - 99 mg/dL 296(H) 245(H) 92  BUN 6 - 20 mg/dL _1 Creatinine 0.44 - 1.00 mg/dL 0.66 0.73 0.66  BUN/Creat Ratio 6 - 22 (calc) - - NOT APPLICABLE  Sodium 237 - 145 mmol/L 138 140 139  Potassium 3.5 - 5.1 mmol/L 3.8 3.7 4.1  Chloride 98 - 111 mmol/L 103 106 103  CO2 22 - 32 mmol/L _2 Calcium 8.9 - 10.3 mg/dL 8.8(L) 9.2 9.6   Hepatic Function Latest Ref Rng & Units 10/10/2019 01/19/2019 12/21/2018  Total Protein 6.5 - 8.1 g/dL 6.2(L) 6.9 7.7  Albumin 3.5 - 5.0 g/dL 3.3(L) - -  AST 15 - 41 U/L 14(L) 17 15  ALT 0 - 44 U/L _3 Alk Phosphatase 38 - 126 U/L 89 - -  Total Bilirubin 0.3 - 1.2 mg/dL 0.7 0.3 0.4  Bilirubin, Direct 0.0 - 0.3 mg/dL - - -   CBC Latest Ref Rng & Units 10/10/2019 10/08/2019 01/19/2019  WBC 4.0 - 10.5 K/uL 8.3 11.3(H) 10.2  Hemoglobin 12.0 - 15.0 g/dL 12.1 12.3 12.8  Hematocrit 36.0 - 46.0 % 37.0 38.6 38.0  Platelets 150 - 400 K/uL 273 347 381   Lipid Panel     Component Value Date/Time   CHOL 172 10/09/2019 1000   TRIG 132 10/09/2019 1000   HDL 39 (L) 10/09/2019 1000   CHOLHDL 4.4 10/09/2019 1000   VLDL 26 10/09/2019 1000   LDLCALC 107 (H) 10/09/2019 1000   LDLCALC 150 (H) 12/21/2018 1706   LDLDIRECT 94.0 09/25/2015 0956   Cardiac Panel (last 3 results) No results for input(s): CKTOTAL, CKMB, TROPONINI, RELINDX in the last 72 hours.  HEMOGLOBIN A1C Lab Results  Component Value  Date   HGBA1C 7.6 (A) 08/31/2019   TSH No results for input(s): TSH in the last 8760 hours. Imaging: Imaging results have been reviewed  Cardiac Studies:  EKG: normal EKG, normal sinus rhythm, Compared to 10/09/2019, inferior T wave inversion no longer present..  Echocardiogram 10/09/2019:   1. Left ventricular ejection fraction, by visual estimation, is 50 to 55%. The left ventricle has low normal function. There is mildly increased left ventricular hypertrophy. Basal to mid inferolateral hypokinesis  2. The average left ventricular global longitudinal strain is -14.9 %.  3. Global right ventricle has normal systolic function.The right ventricular size is normal. No increase in right ventricular wall thickness.  Left heart catheterization 10/09/2019: Hyperdynamic LV, EF 65 to 70%, EDP normal. RCA is a dominant vessel.  Ulcerated tandem 80% followed  by a 99% stenosis.  SP 2.5 x 38 and a 2.5 x 8 mm resolute Onyx DES stents implanted, second stent used for distal edge dissection.  Stenosis reduced to 0% with TIMI-3 to TIMI-3 flow. Circumflex coronary artery is smooth with minimal disease.  Gives origin to large OM1 and OM 2.  Tortuous. LAD is diffusely diseased from the mid to distal segment.  There is a hazy 70 to 80% stenosis in the mid LAD.  Following which distal LAD had a 40% diffuse disease.  Mild calcification is evident.  SP 2.24 x 15 mm resolute Onyx DES postdilated with 2.25 x 8 mm [place] at 22 atmospheric pressure with 0% residual stenosis.  TIMI-3 to TIMI-3 flow.  Assessment/Plan:  1.  NSTEMI 2.  Coronary artery disease the native vessel with unstable angina pectoris 3.  Diabetes mellitus type II uncontrolled with hyperglycemia 4.  Hyperlipidemia  Recommendation: Patient is stable from cardiac standpoint.  Drawn multivessel disease and hyperlipidemia, should be a good candidate for AEGES II Trial.  Patient is willing to participate.  NO further chest pain, right shoulder pain appears to be musculoskeletal. May need evaluation for the same.  [name] can be discharged today with OP f/u with me in 1 week to 10 days.   I have discussed regarding DM and weight loss as well. Continue statins for hyperlipidemia. Continue Lipitor.   Adrian Prows, M.D. 10/10/2019, 8:15 PM [facility], [place] Pager: [phone] Office: [phone] If no answer: [phone]

## 2019-10-10 NOTE — Discharge Summary (Signed)
Physician Discharge Summary  [name] [id number] DOB: [birth year] DOA: 10/08/2019  PCP: Crecencio Mc, MD  Admit date: 10/08/2019 Discharge date: 10/10/2019  Time spent: 45 minutes  Recommendations for Outpatient Follow-up:  1. Follow up with Dr Einar Gip as scheduled 2. Cardiac rehab as discussed 3. Follow up with PCP for evaluation of diabetes control   Discharge Diagnoses:  Principal Problem:   NSTEMI (non-ST elevated myocardial infarction) ([place]) Active Problems:   Elevated troponin   Hyperlipidemia associated with type 2 diabetes mellitus ([place])   Uncontrolled type 2 diabetes mellitus with hyperglycemia ([place])   Obesity (BMI 30.0-34.9)   Discharge Condition: stable  Diet recommendation: heart healthy carb modified  Filed Weights   10/08/19 2115 10/10/19 0349  Weight: 86 kg 85.8 kg    History of present illness:  [name] is a 58 y.o. female with medical history significant of HTN, HLD, diabetes mellitus type 2, and GERD.  She presented 11/9 with complaints of chest pain.  She described a 2-3 month history of shoulder and neck pain.  She underwent testing on 10/28 that showed signs of carpal tunnel and subsequently underwent steroid injection into her neck on 11/3.  She reported no change in her pain symptoms.  the day prior to presentation, she found out that her sister had just passed away here at [facility] and that night she was unable to sleep because her chest was bothering her.  Initially started on the right side but moved over to the left.  Associated symptoms included complaints of worsening of the pain in her right shoulder that would radiate into her neck.  Any kind of exertion seemed to exacerbate symptoms and resting seemed to help.  Denied having any nausea, vomiting, abdominal pain, diaphoresis, leg swelling, calf pain, orthopnea, or significant shortness of breath  Hospital Course:    NSTEMI. Chest pain, elevated troponin abnormal ekg.  Presented with  right-sided chest pain that radiated across her chest and into her shoulders and neck.  Troponins noted to be slowly trending up 28-> 38-> 51.  EKG was noted to have new changes in inferior lateral leads. Underwent left heart cath with stents. On day of discharge pain free. Cardiology recommending aggressive risk factor modification with regard to CAD and diabetes. Also recommend uninterrupted dual antiplatelet therapy with asa 81mg  daily and Ticagrelor 90mg  bid for a minimum of 12 months. Cardiac rehab recommended as well  Diabetes mellitus type 2, uncontrolled with hyperglycemia: Last hemoglobin A1c 7.6 on 08/31/2019.  Patient reportedly taking Novolin 20 units in the morning and 55 units in the evening with meals. Close follow up with PCP for diabetes control  Hyperlipidemia: Lipid panel revealed LDL of 107, HDL 39, and triglycerides 132. Continue atorvastatin  Obesity: BMI 32.54 kg/m. Discussed weight loss Procedures: 10/09/19 CORONARY STENT INTERVENTION  LEFT HEART CATH AND CORONARY ANGIOGRAPHY      Consultations:  [place] [facility]  Discharge Exam: Vitals:   10/10/19 0349 10/10/19 0820  BP: (!) 122/95 131/74  Pulse: 85 86  Resp: 16   Temp: 97.8 F (36.6 C) 98 F (36.7 C)  SpO2: 98% 98%    General: awake alert no acute distress Cardiovascular: rrr no mgr no LE edema Respiratory: normal effort BS clear bilaterally no wheeze  Discharge Instructions   Discharge Instructions    Amb Referral to Cardiac Rehabilitation   Complete by: As directed    Diagnosis:  NSTEMI Coronary Stents     After initial evaluation and assessments  completed: Virtual Based Care may be provided alone or in conjunction with Phase 2 Cardiac Rehab based on patient barriers.: Yes   Call MD for:  persistant dizziness or light-headedness   Complete by: As directed    Call MD for:  temperature >100.4   Complete by: As directed    Diet - low sodium heart healthy   Complete by: As  directed    Discharge instructions   Complete by: As directed    Take medications as prescribed Follow up with cardiology as scheduled Cardiac rehab as discussed   Increase activity slowly   Complete by: As directed      Allergies as of 10/10/2019      Reactions   Oxycodone-acetaminophen Other (See Comments)   Causes her to feel "hot".   Penicillins Rash   Did it involve swelling of the face/tongue/throat, SOB, or low BP? No Did it involve sudden or severe rash/hives, skin peeling, or any reaction on the inside of your mouth or nose? No Did you need to seek medical attention at a hospital or doctor's office? No When did it last happen? If all above answers are "NO", may proceed with cephalosporin use.      Medication List    TAKE these medications   aspirin 81 MG chewable tablet Chew 1 tablet (81 mg total) by mouth daily.   atorvastatin 40 MG tablet Commonly known as: LIPITOR Take 1 tablet by mouth once daily   Easy Touch Lancing Device Misc Use to check diabetes 4 times daily  E 11.65   FreeStyle Libre 14 Day Sensor Misc 1 each by Does not apply route every 14 (fourteen) days. Change every 2 weeks   gabapentin 300 MG capsule Commonly known as: NEURONTIN Take 1 capsule (300 mg total) by mouth 3 (three) times daily.   glucose blood test strip Commonly known as: Estate manager/land agent TEST BLOOD SUGARS 3 TIMES DAILY   insulin NPH Human 100 UNIT/ML injection Commonly known as: NovoLIN N ReliOn Inject 0.2-0.55 mLs (20-55 Units total) into the skin 2 (two) times daily before a meal. What changed:   See the new instructions.  Another medication with the same name was removed. Continue taking this medication, and follow the directions you see here.   insulin regular 100 units/mL injection Commonly known as: NOVOLIN R Inject 10-50 Units into the skin daily. Patient is mixing with Novolin N to dose twice daily   irbesartan 75 MG tablet Commonly known as:  AVAPRO Take 1 tablet (75 mg total) by mouth daily.   latanoprost 0.005 % ophthalmic solution Commonly known as: XALATAN Place 1 drop into both eyes at bedtime.   metFORMIN 1000 MG tablet Commonly known as: GLUCOPHAGE TAKE 1 TABLET BY MOUTH TWICE DAILY WITH MEALS   metoprolol succinate 50 MG 24 hr tablet Commonly known as: TOPROL-XL Take 1 tablet (50 mg total) by mouth daily. Take with or immediately following a meal.   multivitamin with minerals tablet Take 1 tablet by mouth daily.   nystatin powder Commonly known as: nystatin Apply powder to affected area twice daily until rash has resolved   omeprazole 40 MG capsule Commonly known as: PRILOSEC Take 1 capsule by mouth once daily   oxyCODONE-acetaminophen 7.5-325 MG tablet Commonly known as: PERCOCET Take 1 tablet by mouth 4 (four) times daily as needed.   ticagrelor 90 MG Tabs tablet Commonly known as: BRILINTA Take 1 tablet (90 mg total) by mouth 2 (two) times daily.   tiZANidine 2  MG tablet Commonly known as: ZANAFLEX TAKE 1 TABLET BY MOUTH EVERY 6 HOURS AS NEEDED FOR MUSCLE SPASM   vitamin B-12 1000 MCG tablet Commonly known as: CYANOCOBALAMIN Take 1,000 mcg by mouth daily.      Allergies  Allergen Reactions  . Oxycodone-Acetaminophen Other (See Comments)    Causes her to feel "hot".  . Penicillins Rash    Did it involve swelling of the face/tongue/throat, SOB, or low BP? No Did it involve sudden or severe rash/hives, skin peeling, or any reaction on the inside of your mouth or nose? No Did you need to seek medical attention at a hospital or doctor's office? No When did it last happen? If all above answers are "NO", may proceed with cephalosporin use.      The results of significant diagnostics from this hospitalization (including imaging, microbiology, ancillary and laboratory) are listed below for reference.    Significant Diagnostic Studies: Dg Chest 2 View  Result Date: 10/08/2019 CLINICAL  DATA:  Chest pain EXAM: CHEST - 2 VIEW COMPARISON:  12/31/2017 FINDINGS: The heart size and mediastinal contours are within normal limits. Both lungs are clear. The visualized skeletal structures are unremarkable. IMPRESSION: No active cardiopulmonary disease. Electronically Signed   By: Inez Catalina M.D.   On: 10/08/2019 21:52    Microbiology: Recent Results (from the past 240 hour(s))  SARS CORONAVIRUS 2 (TAT 6-24 HRS) Nasopharyngeal Nasopharyngeal Swab     Status: None   Collection Time: 10/09/19  8:48 AM   Specimen: Nasopharyngeal Swab  Result Value Ref Range Status   SARS Coronavirus 2 NEGATIVE NEGATIVE Final    Comment: (NOTE) SARS-CoV-2 target nucleic acids are NOT DETECTED. The SARS-CoV-2 RNA is generally detectable in upper and lower respiratory specimens during the acute phase of infection. Negative results do not preclude SARS-CoV-2 infection, do not rule out co-infections with other pathogens, and should not be used as the sole basis for treatment or other patient management decisions. Negative results must be combined with clinical observations, patient history, and epidemiological information. The expected result is Negative. Fact Sheet for Patients: SugarRoll.be Fact Sheet for Healthcare Providers: https://www.woods-mathews.com/ This test is not yet approved or cleared by the [place] FDA and  has been authorized for detection and/or diagnosis of SARS-CoV-2 by FDA under an Emergency Use Authorization (EUA). This EUA will remain  in effect (meaning this test can be used) for the duration of the COVID-19 declaration under Section 56 4(b)(1) of the Act, 21 U.S.C. section 360bbb-3(b)(1), unless the authorization is terminated or revoked sooner. Performed at [facility], [place] [address], [place],  [address]      Labs: Basic Metabolic Panel: Recent Labs  Lab 10/08/19 2123 10/10/19 0959  NA 140 138  K 3.7  3.8  CL 106 103  CO2 24 26  GLUCOSE 245* 296*  BUN 11 6  CREATININE 0.73 0.66  CALCIUM 9.2 8.8*   Liver Function Tests: Recent Labs  Lab 10/10/19 0959  AST 14*  ALT 17  ALKPHOS 89  BILITOT 0.7  PROT 6.2*  ALBUMIN 3.3*   No results for input(s): LIPASE, AMYLASE in the last 168 hours. No results for input(s): AMMONIA in the last 168 hours. CBC: Recent Labs  Lab 10/08/19 2123 10/10/19 0330  WBC 11.3* 8.3  NEUTROABS  --  4.6  HGB 12.3 12.1  HCT 38.6 37.0  MCV 92.1 89.4  PLT 347 273   Cardiac Enzymes: No results for input(s): CKTOTAL, CKMB, CKMBINDEX, TROPONINI in the  last 168 hours. BNP: BNP (last 3 results) No results for input(s): BNP in the last 8760 hours.  ProBNP (last 3 results) No results for input(s): PROBNP in the last 8760 hours.  CBG: Recent Labs  Lab 10/09/19 1904 10/09/19 2155 10/10/19 0747  GLUCAP 242* 296* 290*       Signed:  Radene Gunning NP [facility] 10/10/2019, 10:51 AM

## 2019-10-10 NOTE — Progress Notes (Signed)
CARDIAC REHAB PHASE I   PRE:  Rate/Rhythm: 90 SR  BP:  Supine: 131/74  Sitting:   Standing:    SaO2: 98%RA  MODE:  Ambulation: 400 ft   POST:  Rate/Rhythm: 91 SR  BP:  Supine:   Sitting: 115/87  Standing:    SaO2: 98%RA 0800-0930 Pt walked 400 ft on RA with steady gait. C/o throbbing in right shoulder and arm at 5/10 on pain scale. Stated it hurts when she moves her neck or touches area. VSS. Sounds more like muscular pain than CP. She stated she got an injection in her neck on the 3rd of this month. Denied chest pressure. MI education completed with pt who voiced understanding. Stressed importance of brilinta with stent. Reviewed NTG use, MI restrictions, heart healthy and low carb foods (gave diet handouts), walking for ex, risk factors, and CRP 2. Referred to [place] CRP 2. RN aware of discomfort during walk. Pt has also been under stress with recent death of her sister this week.  Pt is interested in participating in Virtual Cardiac and Pulmonary Rehab. Pt advised that Virtual Cardiac and Pulmonary Rehab is provided at no cost to the patient.  Checklist:  1. Pt has smart device  ie smartphone and/or ipad for downloading an app  Yes 2. Reliable internet/wifi service    Yes 3. Understands how to use their smartphone and navigate within an app.  Yes   Pt verbalized understanding and is in agreement.    Graylon Good, RN BSN  10/10/2019 9:29 AM

## 2019-10-10 NOTE — Discharge Instructions (Signed)
Radial Site Care ° °This sheet gives you information about how to care for yourself after your procedure. Your health care provider may also give you more specific instructions. If you have problems or questions, contact your health care provider. °What can I expect after the procedure? °After the procedure, it is common to have: °· Bruising and tenderness at the catheter insertion area. °Follow these instructions at home: °Medicines °· Take over-the-counter and prescription medicines only as told by your health care provider. °Insertion site care °· Follow instructions from your health care provider about how to take care of your insertion site. Make sure you: °? Wash your hands with soap and water before you change your bandage (dressing). If soap and water are not available, use hand sanitizer. °? Change your dressing as told by your health care provider. °? Leave stitches (sutures), skin glue, or adhesive strips in place. These skin closures may need to stay in place for 2 weeks or longer. If adhesive strip edges start to loosen and curl up, you may trim the loose edges. Do not remove adhesive strips completely unless your health care provider tells you to do that. °· Check your insertion site every day for signs of infection. Check for: °? Redness, swelling, or pain. °? Fluid or blood. °? Pus or a bad smell. °? Warmth. °· Do not take baths, swim, or use a hot tub until your health care provider approves. °· You may shower 24-48 hours after the procedure, or as directed by your health care provider. °? Remove the dressing and gently wash the site with plain soap and water. °? Pat the area dry with a clean towel. °? Do not rub the site. That could cause bleeding. °· Do not apply powder or lotion to the site. °Activity ° °· For 24 hours after the procedure, or as directed by your health care provider: °? Do not flex or bend the affected arm. °? Do not push or pull heavy objects with the affected arm. °? Do not  drive yourself home from the hospital or clinic. You may drive 24 hours after the procedure unless your health care provider tells you not to. °? Do not operate machinery or power tools. °· Do not lift anything that is heavier than 10 lb (4.5 kg), or the limit that you are told, until your health care provider says that it is safe. °· Ask your health care provider when it is okay to: °? Return to work or school. °? Resume usual physical activities or sports. °? Resume sexual activity. °General instructions °· If the catheter site starts to bleed, raise your arm and put firm pressure on the site. If the bleeding does not stop, get help right away. This is a medical emergency. °· If you went home on the same day as your procedure, a responsible adult should be with you for the first 24 hours after you arrive home. °· Keep all follow-up visits as told by your health care provider. This is important. °Contact a health care provider if: °· You have a fever. °· You have redness, swelling, or yellow drainage around your insertion site. °Get help right away if: °· You have unusual pain at the radial site. °· The catheter insertion area swells very fast. °· The insertion area is bleeding, and the bleeding does not stop when you hold steady pressure on the area. °· Your arm or hand becomes pale, cool, tingly, or numb. °These symptoms may represent a serious problem   that is an emergency. Do not wait to see if the symptoms will go away. Get medical help right away. Call your local emergency services (911 in the [place] Do not drive yourself to the hospital. Summary  After the procedure, it is common to have bruising and tenderness at the site.  Follow instructions from your health care provider about how to take care of your radial site wound. Check the wound every day for signs of infection.  Do not lift anything that is heavier than 10 lb (4.5 kg), or the limit that you are told, until your health care provider says  that it is safe. This information is not intended to replace advice given to you by your health care provider. Make sure you discuss any questions you have with your health care provider. Document Released: 12/19/2010 Document Revised: 12/22/2017 Document Reviewed: 12/22/2017 Elsevier Patient Education  2020 [employer].    Diabetes Mellitus and Nutrition, Adult When you have diabetes (diabetes mellitus), it is very important to have healthy eating habits because your blood sugar (glucose) levels are greatly affected by what you eat and drink. Eating healthy foods in the appropriate amounts, at about the same times every day, can help you:  Control your blood glucose.  Lower your risk of heart disease.  Improve your blood pressure.  Reach or maintain a healthy weight. Every person with diabetes is different, and each person has different needs for a meal plan. Your health care provider may recommend that you work with a diet and nutrition specialist (dietitian) to make a meal plan that is best for you. Your meal plan may vary depending on factors such as:  The calories you need.  The medicines you take.  Your weight.  Your blood glucose, blood pressure, and cholesterol levels.  Your activity level.  Other health conditions you have, such as heart or kidney disease. How do carbohydrates affect me? Carbohydrates, also called carbs, affect your blood glucose level more than any other type of food. Eating carbs naturally raises the amount of glucose in your blood. Carb counting is a method for keeping track of how many carbs you eat. Counting carbs is important to keep your blood glucose at a healthy level, especially if you use insulin or take certain oral diabetes medicines. It is important to know how many carbs you can safely have in each meal. This is different for every person. Your dietitian can help you calculate how many carbs you should have at each meal and for each  snack. Foods that contain carbs include:  Bread, cereal, rice, pasta, and crackers.  Potatoes and corn.  Peas, beans, and lentils.  Milk and yogurt.  Fruit and juice.  Desserts, such as cakes, cookies, ice cream, and candy. How does alcohol affect me? Alcohol can cause a sudden decrease in blood glucose (hypoglycemia), especially if you use insulin or take certain oral diabetes medicines. Hypoglycemia can be a life-threatening condition. Symptoms of hypoglycemia (sleepiness, dizziness, and confusion) are similar to symptoms of having too much alcohol. If your health care provider says that alcohol is safe for you, follow these guidelines:  Limit alcohol intake to no more than 1 drink per day for nonpregnant women and 2 drinks per day for men. One drink equals 12 oz of beer, 5 oz of wine, or 1 oz of hard liquor.  Do not drink on an empty stomach.  Keep yourself hydrated with water, diet soda, or unsweetened iced tea.  Keep in mind  that regular soda, juice, and other mixers may contain a lot of sugar and must be counted as carbs. What are tips for following this plan?  Reading food labels  Start by checking the serving size on the "Nutrition Facts" label of packaged foods and drinks. The amount of calories, carbs, fats, and other nutrients listed on the label is based on one serving of the item. Many items contain more than one serving per package.  Check the total grams (g) of carbs in one serving. You can calculate the number of servings of carbs in one serving by dividing the total carbs by 15. For example, if a food has 30 g of total carbs, it would be equal to 2 servings of carbs.  Check the number of grams (g) of saturated and trans fats in one serving. Choose foods that have low or no amount of these fats.  Check the number of milligrams (mg) of salt (sodium) in one serving. Most people should limit total sodium intake to less than 2,300 mg per day.  Always check the  nutrition information of foods labeled as "low-fat" or "nonfat". These foods may be higher in added sugar or refined carbs and should be avoided.  Talk to your dietitian to identify your daily goals for nutrients listed on the label. Shopping  Avoid buying canned, premade, or processed foods. These foods tend to be high in fat, sodium, and added sugar.  Shop around the outside edge of the grocery store. This includes fresh fruits and vegetables, bulk grains, fresh meats, and fresh dairy. Cooking  Use low-heat cooking methods, such as baking, instead of high-heat cooking methods like deep frying.  Cook using healthy oils, such as olive, canola, or sunflower oil.  Avoid cooking with butter, cream, or high-fat meats. Meal planning  Eat meals and snacks regularly, preferably at the same times every day. Avoid going long periods of time without eating.  Eat foods high in fiber, such as fresh fruits, vegetables, beans, and whole grains. Talk to your dietitian about how many servings of carbs you can eat at each meal.  Eat 4-6 ounces (oz) of lean protein each day, such as lean meat, chicken, fish, eggs, or tofu. One oz of lean protein is equal to: ? 1 oz of meat, chicken, or fish. ? 1 egg. ?  cup of tofu.  Eat some foods each day that contain healthy fats, such as avocado, nuts, seeds, and fish. Lifestyle  Check your blood glucose regularly.  Exercise regularly as told by your health care provider. This may include: ? 150 minutes of moderate-intensity or vigorous-intensity exercise each week. This could be brisk walking, biking, or water aerobics. ? Stretching and doing strength exercises, such as yoga or weightlifting, at least 2 times a week.  Take medicines as told by your health care provider.  Do not use any products that contain nicotine or tobacco, such as cigarettes and e-cigarettes. If you need help quitting, ask your health care provider.  Work with a Social worker or diabetes  educator to identify strategies to manage stress and any emotional and social challenges. Questions to ask a health care provider  Do I need to meet with a diabetes educator?  Do I need to meet with a dietitian?  What number can I call if I have questions?  When are the best times to check my blood glucose? Where to find more information:  American Diabetes Association: diabetes.org  Academy of Nutrition and Dietetics: www.eatright.org  National  Institute of Diabetes and Digestive and Kidney Diseases (NIH): [employer] Summary  A healthy meal plan will help you control your blood glucose and maintain a healthy lifestyle.  Working with a diet and nutrition specialist (dietitian) can help you make a meal plan that is best for you.  Keep in mind that carbohydrates (carbs) and alcohol have immediate effects on your blood glucose levels. It is important to count carbs and to use alcohol carefully. This information is not intended to replace advice given to you by your health care provider. Make sure you discuss any questions you have with your health care provider. Document Released: 08/13/2005 Document Revised: 10/29/2017 Document Reviewed: 12/21/2016 Elsevier Patient Education  2020 [employer] Heart-healthy meal planning includes:  Eating less unhealthy fats.  Eating more healthy fats.  Making other changes in your diet. Talk with your doctor or a diet specialist (dietitian) to create an eating plan that is right for you. What is my plan? Your doctor may recommend an eating plan that includes:  Total fat: ______% or less of total calories a day.  Saturated fat: ______% or less of total calories a day.  Cholesterol: less than _________mg a day. What are tips for following this plan? Cooking Avoid frying your food. Try to bake, boil, grill, or broil it instead. You can also reduce fat by:  Removing the skin from poultry.  Removing  all visible fats from meats.  Steaming vegetables in water or broth. Meal planning   At meals, divide your plate into four equal parts: ? Fill one-half of your plate with vegetables and green salads. ? Fill one-fourth of your plate with whole grains. ? Fill one-fourth of your plate with lean protein foods.  Eat 4-5 servings of vegetables per day. A serving of vegetables is: ? 1 cup of raw or cooked vegetables. ? 2 cups of raw leafy greens.  Eat 4-5 servings of fruit per day. A serving of fruit is: ? 1 medium whole fruit. ?  cup of dried fruit. ?  cup of fresh, frozen, or canned fruit. ?  cup of 100% fruit juice.  Eat more foods that have soluble fiber. These are apples, broccoli, carrots, beans, peas, and barley. Try to get 20-30 g of fiber per day.  Eat 4-5 servings of nuts, legumes, and seeds per week: ? 1 serving of dried beans or legumes equals  cup after being cooked. ? 1 serving of nuts is  cup. ? 1 serving of seeds equals 1 tablespoon. General information  Eat more home-cooked food. Eat less restaurant, buffet, and fast food.  Limit or avoid alcohol.  Limit foods that are high in starch and sugar.  Avoid fried foods.  Lose weight if you are overweight.  Keep track of how much salt (sodium) you eat. This is important if you have high blood pressure. Ask your doctor to tell you more about this.  Try to add vegetarian meals each week. Fats  Choose healthy fats. These include olive oil and canola oil, flaxseeds, walnuts, almonds, and seeds.  Eat more omega-3 fats. These include salmon, mackerel, sardines, tuna, flaxseed oil, and ground flaxseeds. Try to eat fish at least 2 times each week.  Check food labels. Avoid foods with trans fats or high amounts of saturated fat.  Limit saturated fats. ? These are often found in animal products, such as meats, butter, and cream. ? These are also found in plant foods, such as palm oil, palm  kernel oil, and coconut  oil.  Avoid foods with partially hydrogenated oils in them. These have trans fats. Examples are stick margarine, some tub margarines, cookies, crackers, and other baked goods. What foods can I eat? Fruits All fresh, canned (in natural juice), or frozen fruits. Vegetables Fresh or frozen vegetables (raw, steamed, roasted, or grilled). Green salads. Grains Most grains. Choose whole wheat and whole grains most of the time. Rice and pasta, including brown rice and pastas made with whole wheat. Meats and other proteins Lean, well-trimmed beef, veal, pork, and lamb. Chicken and [place] without skin. All fish and shellfish. Wild duck, rabbit, pheasant, and venison. Egg whites or low-cholesterol egg substitutes. Dried beans, peas, lentils, and tofu. Seeds and most nuts. Dairy Low-fat or nonfat cheeses, including ricotta and mozzarella. Skim or 1% milk that is liquid, powdered, or evaporated. Buttermilk that is made with low-fat milk. Nonfat or low-fat yogurt. Fats and oils Non-hydrogenated (trans-free) margarines. Vegetable oils, including soybean, sesame, sunflower, olive, peanut, safflower, corn, canola, and cottonseed. Salad dressings or mayonnaise made with a vegetable oil. Beverages Mineral water. Coffee and tea. Diet carbonated beverages. Sweets and desserts Sherbet, gelatin, and fruit ice. Small amounts of dark chocolate. Limit all sweets and desserts. Seasonings and condiments All seasonings and condiments. The items listed above may not be a complete list of foods and drinks you can eat. Contact a dietitian for more options. What foods should I avoid? Fruits Canned fruit in heavy syrup. Fruit in cream or butter sauce. Fried fruit. Limit coconut. Vegetables Vegetables cooked in cheese, cream, or butter sauce. Fried vegetables. Grains Breads that are made with saturated or trans fats, oils, or whole milk. Croissants. Sweet rolls. Donuts. High-fat crackers, such as cheese crackers. Meats  and other proteins Fatty meats, such as hot dogs, ribs, sausage, bacon, rib-eye roast or steak. High-fat deli meats, such as salami and bologna. Caviar. Domestic duck and goose. Organ meats, such as liver. Dairy Cream, sour cream, cream cheese, and creamed cottage cheese. Whole-milk cheeses. Whole or 2% milk that is liquid, evaporated, or condensed. Whole buttermilk. Cream sauce or high-fat cheese sauce. Yogurt that is made from whole milk. Fats and oils Meat fat, or shortening. Cocoa butter, hydrogenated oils, palm oil, coconut oil, palm kernel oil. Solid fats and shortenings, including bacon fat, salt pork, lard, and butter. Nondairy cream substitutes. Salad dressings with cheese or sour cream. Beverages Regular sodas and juice drinks with added sugar. Sweets and desserts Frosting. Pudding. Cookies. Cakes. Pies. Milk chocolate or white chocolate. Buttered syrups. Full-fat ice cream or ice cream drinks. The items listed above may not be a complete list of foods and drinks to avoid. Contact a dietitian for more information. Summary  Heart-healthy meal planning includes eating less unhealthy fats, eating more healthy fats, and making other changes in your diet.  Eat a balanced diet. This includes fruits and vegetables, low-fat or nonfat dairy, lean protein, nuts and legumes, whole grains, and heart-healthy oils and fats. This information is not intended to replace advice given to you by your health care provider. Make sure you discuss any questions you have with your health care provider. Document Released: 05/17/2012 Document Revised: 01/20/2018 Document Reviewed: 12/24/2017 Elsevier Patient Education  2020 [employer].

## 2019-10-11 ENCOUNTER — Telehealth: Payer: Self-pay

## 2019-10-11 NOTE — Telephone Encounter (Signed)
-----   Message from Dorethea Clan sent at 10/11/2019 10:27 AM EST ----- Regarding: TOC Needs to be called todat per Lavone Nian, was d/c yesterday

## 2019-10-11 NOTE — Telephone Encounter (Signed)
Location of hospitalization: [place] Reason for hospitalization: Heart Attack had 3 stents Date of discharge: 10/10/19 Date of first communication with patient: today Person contacting patient: Me Current symptoms: pain in right shoulder and wrist Do you understand why you were in the Hospital: Yes Questions regarding discharge instructions: None Where were you discharged to: Home Medications reviewed: Yes Allergies reviewed: Yes Dietary changes reviewed: Yes. Discussed low fat and low salt diet.  Referals reviewed: NA Activities of Daily Living: Able to with mild limitations Any transportation issues/concerns: None Any patient concerns: None Confirmed importance & date/time of Follow up appt: Yes Confirmed with patient if condition begins to worsen call. Pt was given the office number and encouraged to call back with questions or concerns: Yes

## 2019-10-12 DIAGNOSIS — Z0279 Encounter for issue of other medical certificate: Secondary | ICD-10-CM

## 2019-10-12 DIAGNOSIS — I2510 Atherosclerotic heart disease of native coronary artery without angina pectoris: Secondary | ICD-10-CM

## 2019-10-14 ENCOUNTER — Emergency Department (HOSPITAL_COMMUNITY)
Admission: EM | Admit: 2019-10-14 | Discharge: 2019-10-14 | Disposition: A | Discharge status: Home / Self Care | Payer: BC Managed Care – PPO | Attending: Emergency Medicine | Admitting: Emergency Medicine

## 2019-10-14 ENCOUNTER — Encounter (HOSPITAL_COMMUNITY): Payer: Self-pay | Admitting: Emergency Medicine

## 2019-10-14 ENCOUNTER — Other Ambulatory Visit: Payer: Self-pay

## 2019-10-14 DIAGNOSIS — R233 Spontaneous ecchymoses: Secondary | ICD-10-CM | POA: Insufficient documentation

## 2019-10-14 DIAGNOSIS — Z87891 Personal history of nicotine dependence: Secondary | ICD-10-CM | POA: Insufficient documentation

## 2019-10-14 DIAGNOSIS — I2510 Atherosclerotic heart disease of native coronary artery without angina pectoris: Secondary | ICD-10-CM | POA: Insufficient documentation

## 2019-10-14 DIAGNOSIS — T148XXA Other injury of unspecified body region, initial encounter: Secondary | ICD-10-CM

## 2019-10-14 DIAGNOSIS — Z794 Long term (current) use of insulin: Secondary | ICD-10-CM | POA: Diagnosis not present

## 2019-10-14 DIAGNOSIS — Z79899 Other long term (current) drug therapy: Secondary | ICD-10-CM | POA: Diagnosis not present

## 2019-10-14 DIAGNOSIS — E119 Type 2 diabetes mellitus without complications: Secondary | ICD-10-CM | POA: Diagnosis not present

## 2019-10-14 DIAGNOSIS — I10 Essential (primary) hypertension: Secondary | ICD-10-CM | POA: Insufficient documentation

## 2019-10-14 LAB — CBC WITH DIFFERENTIAL/PLATELET
Abs Immature Granulocytes: 0.05 10*3/uL (ref 0.00–0.07)
Basophils Absolute: 0.1 10*3/uL (ref 0.0–0.1)
Basophils Relative: 1 %
Eosinophils Absolute: 0.2 10*3/uL (ref 0.0–0.5)
Eosinophils Relative: 2 %
HCT: 34.8 % — ABNORMAL LOW (ref 36.0–46.0)
Hemoglobin: 11.2 g/dL — ABNORMAL LOW (ref 12.0–15.0)
Immature Granulocytes: 1 %
Lymphocytes Relative: 21 %
Lymphs Abs: 2 10*3/uL (ref 0.7–4.0)
MCH: 29.3 pg (ref 26.0–34.0)
MCHC: 32.2 g/dL (ref 30.0–36.0)
MCV: 91.1 fL (ref 80.0–100.0)
Monocytes Absolute: 0.6 10*3/uL (ref 0.1–1.0)
Monocytes Relative: 6 %
Neutro Abs: 6.8 10*3/uL (ref 1.7–7.7)
Neutrophils Relative %: 69 %
Platelets: 306 10*3/uL (ref 150–400)
RBC: 3.82 MIL/uL — ABNORMAL LOW (ref 3.87–5.11)
RDW: 12.3 % (ref 11.5–15.5)
WBC: 9.7 10*3/uL (ref 4.0–10.5)
nRBC: 0 % (ref 0.0–0.2)

## 2019-10-14 LAB — BASIC METABOLIC PANEL
Anion gap: 10 (ref 5–15)
BUN: 12 mg/dL (ref 6–20)
CO2: 24 mmol/L (ref 22–32)
Calcium: 8.7 mg/dL — ABNORMAL LOW (ref 8.9–10.3)
Chloride: 102 mmol/L (ref 98–111)
Creatinine, Ser: 0.57 mg/dL (ref 0.44–1.00)
GFR calc Af Amer: >60 mL/min (ref >60)
GFR calc non Af Amer: >60 mL/min (ref >60)
Glucose, Bld: 180 mg/dL — ABNORMAL HIGH (ref 70–99)
Potassium: 4 mmol/L (ref 3.5–5.1)
Sodium: 136 mmol/L (ref 135–145)

## 2019-10-14 LAB — PROTIME-INR
INR: 1 (ref 0.8–1.2)
Prothrombin Time: 12.8 seconds (ref 11.4–15.2)

## 2019-10-14 NOTE — ED Provider Notes (Signed)
[facility] EMERGENCY DEPARTMENT Provider Note   CSN: [id number] Arrival date & time: 10/14/19  0908     History   Chief Complaint Chief Complaint  Patient presents with  . bruising to abd    HPI [name] is a 58 y.o. female.     HPI  This patient is a 58 year old female, she complains of lower abdominal bruising which she saw this morning when she looked in the mirror.  She has otherwise having no complaints today.  She has been in the hospital recently for approximately 4 days when she was treated for chest pain and found to have a non-ST elevation myocardial infarction, went to the Cath Lab and had 3 stents placed.  She was subsequently placed on a baby aspirin and Brilinta which she has been taking.  She does recall while she was in the hospital getting subcutaneous Lovenox injections in the abdomen.  She has minimal tenderness to the abdomen, the bruising has been present for an unknown amount of time, she just noticed it today.  She reports not seeing any blood in her stool, no blood in her urine, no vomiting, no coughing, no other abnormal bruising.  She does not have any bruising at the site of the cath site in her wrist.  Past Medical History:  Diagnosis Date  . Arthritis   . Campylobacter enteritis May 2012   [facility] admission  . Carpal tunnel syndrome, bilateral   . Diabetes mellitus   . Gastritis with bleeding due to alcohol 2010   [facility] admission  . GERD (gastroesophageal reflux disease)   . Hyperlipidemia   . Hypertension    no meds  . Neck pain   . Wears glasses     Patient Active Problem List   Diagnosis Date Noted  . NSTEMI (non-ST elevated myocardial infarction) ([place]) 10/10/2019  . CAD (coronary artery disease) 10/10/2019  . Chest pain 10/09/2019  . Elevated troponin 10/09/2019  . Uncontrolled type 2 diabetes mellitus with hyperglycemia ([place]) 10/09/2019  . Obesity (BMI 30.0-34.9) 10/09/2019  . Drug induced constipation  09/08/2019  . Cervical spondylitis with radiculitis ([place]) 08/29/2019  . Acute bursitis of left shoulder 01/30/2019  . Chronic midline posterior neck pain 06/28/2018  . Urgency incontinence 06/28/2018  . Degenerative joint disease of knee, left 04/26/2018  . Carpal tunnel syndrome on both sides 03/29/2018  . Anemia 01/02/2018  . Class 1 obesity with serious comorbidity and body mass index (BMI) of 32.0 to 32.9 in adult 07/23/2017  . Left hip pain 05/04/2017  . Carpal tunnel syndrome on right 12/27/2016  . Polyarthralgia 11/09/2016  . Uncontrolled type 2 diabetes mellitus with retinopathy, with long-term current use of insulin ([place]) 07/07/2016  . Visit for preventive health examination 06/21/2016  . Increased urinary frequency 06/21/2016  . Chronic meniscal tear of knee 08/19/2015  . Left knee pain 06/19/2015  . Candidiasis of skin 06/18/2015  . Genital herpes 07/31/2014  . Ulnar nerve compression 09/21/2013  . Noncompliance with diet and medication regimen 11/15/2012  . Boil, breast 08/14/2012  . Arthritis   . Screening for breast cancer 08/04/2011  . Screening for colon cancer 08/04/2011  . HIDRADENITIS SUPPURATIVA 04/11/2007  . GERD 11/17/2006  . ENDOMETRIAL POLYP 11/17/2006  . LOW BACK PAIN 11/17/2006  . FIBROIDS, UTERUS 11/16/2006  . Hyperlipidemia associated with type 2 diabetes mellitus ([place]) 11/16/2006  . Essential hypertension 11/16/2006    Past Surgical History:  Procedure Laterality Date  . CARPAL TUNNEL RELEASE Left  05/14/2014   Procedure: LEFT ULNAR NEUROPLASTY AT ELBOW AND ENDOSCOPIC CARPAL TUNNEL RELEASE;  Surgeon: Jolyn Nap, MD;  Location: [facility];  Service: Orthopedics;  Laterality: Left;  . CORONARY STENT INTERVENTION N/A 10/09/2019   Procedure: CORONARY STENT INTERVENTION;  Surgeon: Adrian Prows, MD;  Location: [place] CV LAB;  Service: Cardiovascular;  Laterality: N/A;  . CYST EXCISION  1995   under arms  . endometrial biopsy  2004    benign  . FOOT OSTEOTOMY  2010   right-spurs  . LEFT HEART CATH AND CORONARY ANGIOGRAPHY N/A 10/09/2019   Procedure: LEFT HEART CATH AND CORONARY ANGIOGRAPHY;  Surgeon: Adrian Prows, MD;  Location: [facility];  Service: Cardiovascular;  Laterality: N/A;  . TONSILLECTOMY    . ULNAR NERVE TRANSPOSITION Left 05/14/2014   Procedure: ULNAR NERVE DECOMPRESSION/TRANSPOSITION;  Surgeon: Jolyn Nap, MD;  Location: [facility];  Service: Orthopedics;  Laterality: Left;     OB History   No obstetric history on file.      Home Medications    Prior to Admission medications   Medication Sig Start Date End Date Taking? Authorizing Provider  aspirin 81 MG chewable tablet Chew 1 tablet (81 mg total) by mouth daily. 10/10/19   Black, Lezlie Octave, NP  atorvastatin (LIPITOR) 40 MG tablet Take 1 tablet by mouth once daily 07/21/19   Crecencio Mc, MD  Continuous Blood Gluc Sensor (FREESTYLE LIBRE 14 DAY SENSOR) MISC 1 each by Does not apply route every 14 (fourteen) days. Change every 2 weeks 11/01/17   Philemon Kingdom, MD  gabapentin (NEURONTIN) 300 MG capsule Take 1 capsule (300 mg total) by mouth 3 (three) times daily. 08/29/19   Marcial Pacas, MD  glucose blood (BAYER CONTOUR TEST) test strip TEST BLOOD SUGARS 3 TIMES DAILY 08/12/16   Philemon Kingdom, MD  insulin NPH Human (NOVOLIN N RELION) 100 UNIT/ML injection Inject 0.2-0.55 mLs (20-55 Units total) into the skin 2 (two) times daily before a meal. 10/10/19   Black, Lezlie Octave, NP  insulin regular (NOVOLIN R) 100 units/mL injection Inject 10-50 Units into the skin daily. Patient is mixing with Novolin N to dose twice daily    [provider]  irbesartan (AVAPRO) 75 MG tablet Take 1 tablet (75 mg total) by mouth daily. 10/10/19   Black, Lezlie Octave, NP  Lancet Devices (EASY TOUCH LANCING DEVICE) MISC Use to check diabetes 4 times daily  E 11.65 08/12/16   Philemon Kingdom, MD  latanoprost (XALATAN) 0.005 % ophthalmic solution  Place 1 drop into both eyes at bedtime.  12/02/18   [provider]  metFORMIN (GLUCOPHAGE) 1000 MG tablet TAKE 1 TABLET BY MOUTH TWICE DAILY WITH MEALS 03/28/19   Crecencio Mc, MD  metoprolol succinate (TOPROL-XL) 50 MG 24 hr tablet Take 1 tablet (50 mg total) by mouth daily. Take with or immediately following a meal. 10/10/19   Black, Lezlie Octave, NP  Multiple Vitamin (MULTIVITAMIN) tablet Take 1 tablet by mouth daily.    [provider]  Multiple Vitamins-Minerals (MULTIVITAMIN WITH MINERALS) tablet Take 1 tablet by mouth daily.    [provider]  nystatin (NYSTATIN) powder Apply powder to affected area twice daily until rash has resolved 03/28/18   Crecencio Mc, MD  omeprazole (PRILOSEC) 40 MG capsule Take 1 capsule by mouth once daily 05/23/19   Crecencio Mc, MD  oxyCODONE-acetaminophen (PERCOCET) 7.5-325 MG tablet Take 1 tablet by mouth 4 (four) times daily as  needed. 10/07/19   [provider]  ticagrelor (BRILINTA) 90 MG TABS tablet Take 1 tablet (90 mg total) by mouth 2 (two) times daily. 10/10/19   Black, Lezlie Octave, NP  tiZANidine (ZANAFLEX) 2 MG tablet TAKE 1 TABLET BY MOUTH EVERY 6 HOURS AS NEEDED FOR MUSCLE SPASM 08/23/19   Pete Pelt, PA-C  vitamin B-12 (CYANOCOBALAMIN) 1000 MCG tablet Take 1,000 mcg by mouth daily.    [provider]    Family History Family History  Problem Relation Age of Onset  . Hypertension Mother   . Stroke Father   . Diabetes Maternal Grandmother   . Hypertension Maternal Grandmother     Social History Social History   Tobacco Use  . Smoking status: Former Smoker    Quit date: 05/09/2012    Years since quitting: 7.4  . Smokeless tobacco: Never Used  Substance Use Topics  . Alcohol use: Not Currently  . Drug use: Yes    Types: Marijuana    Comment: sometimes     Allergies   Oxycodone-acetaminophen and Penicillins   Review of Systems Review of Systems  Constitutional: Negative for chills  and fever.  HENT: Negative for sore throat.   Eyes: Negative for visual disturbance.  Respiratory: Negative for cough and shortness of breath.   Cardiovascular: Negative for chest pain.  Gastrointestinal: Negative for abdominal pain, diarrhea, nausea and vomiting.  Genitourinary: Negative for dysuria and frequency.  Musculoskeletal: Negative for back pain and neck pain.  Skin: Negative for rash.  Neurological: Negative for weakness, numbness and headaches.  Hematological: Negative for adenopathy. Bruises/bleeds easily.  Psychiatric/Behavioral: Negative for behavioral problems.     Physical Exam Updated Vital Signs BP 124/76 (BP Location: Left Arm)   Pulse 85   Temp 98.8 F (37.1 C) (Oral)   Resp 20   SpO2 100%   Physical Exam Vitals signs and nursing note reviewed.  Constitutional:      General: She is not in acute distress.    Appearance: She is well-developed.  HENT:     Head: Normocephalic and atraumatic.     Mouth/Throat:     Pharynx: No oropharyngeal exudate.  Eyes:     General: No scleral icterus.       Right eye: No discharge.        Left eye: No discharge.     Conjunctiva/sclera: Conjunctivae normal.     Pupils: Pupils are equal, round, and reactive to light.  Neck:     Musculoskeletal: Normal range of motion and neck supple.     Thyroid: No thyromegaly.     Vascular: No JVD.  Cardiovascular:     Rate and Rhythm: Normal rate and regular rhythm.     Heart sounds: Normal heart sounds. No murmur. No friction rub. No gallop.   Pulmonary:     Effort: Pulmonary effort is normal. No respiratory distress.     Breath sounds: Normal breath sounds. No wheezing or rales.  Abdominal:     General: Bowel sounds are normal. There is no distension.     Palpations: Abdomen is soft. There is no mass.     Tenderness: There is no abdominal tenderness.     Comments: There is bruising with minimal tenderness over the abdominal wall below the umbilicus  Musculoskeletal: Normal  range of motion.        General: No tenderness.  Lymphadenopathy:     Cervical: No cervical adenopathy.  Skin:    General: Skin is warm and dry.  Findings: No erythema or rash.  Neurological:     Mental Status: She is alert.     Coordination: Coordination normal.  Psychiatric:        Behavior: Behavior normal.      ED Treatments / Results  Labs (all labs ordered are listed, but only abnormal results are displayed) Labs Reviewed  CBC WITH DIFFERENTIAL/PLATELET - Abnormal; Notable for the following components:      Result Value   RBC 3.82 (*)    Hemoglobin 11.2 (*)    HCT 34.8 (*)    All other components within normal limits  BASIC METABOLIC PANEL - Abnormal; Notable for the following components:   Glucose, Bld 180 (*)    Calcium 8.7 (*)    All other components within normal limits  PROTIME-INR    EKG EKG Interpretation  Date/Time:  Saturday October 14 2019 09:37:00 EST Ventricular Rate:  89 PR Interval:    QRS Duration: 82 QT Interval:  344 QTC Calculation: 419 R Axis:   32 Text Interpretation: Sinus rhythm Nonspecific T wave abnormality ECG OTHERWISE WITHIN NORMAL LIMITS Confirmed by Noemi Chapel [phone]) on 10/14/2019 9:40:40 AM   Radiology No results found.  Procedures Procedures (including critical care time)  Medications Ordered in ED Medications - No data to display   Initial Impression / Assessment and Plan / ED Course  I have reviewed the triage vital signs and the nursing notes.  Pertinent labs & imaging results that were available during my care of the patient were reviewed by me and considered in my medical decision making (see chart for details).  Clinical Course as of Oct 13 1108  Sat Oct 14, 2019  1103 Hgb 1 gram off - no active bleeding Pt agreeable to d/c.   [BM]    Clinical Course User Index [BM] Noemi Chapel, MD      EKG is unremarkable, exam is consistent with having some bleeding likely at the site of the Lovenox  injections, there is no induration of the skin, no significant abdominal tenderness, no other signs of bleeding, vital signs are unremarkable with a blood pressure of 124/76, pulse of 85.  She has no edema of the legs, no other sites of bleeding, will check labs for reassurance otherwise anticipate discharge.  Hgb without sig drop - 11.2 - pt stable for d/c   can f/u with PCP for recheck   Final Clinical Impressions(s) / ED Diagnoses   Final diagnoses:  Superficial bruising    ED Discharge Orders    None       Noemi Chapel, MD 10/14/19 1109

## 2019-10-14 NOTE — ED Notes (Addendum)
Attempt to draw labs x 3 with no success RN aware

## 2019-10-14 NOTE — Discharge Instructions (Signed)
Please continue your home medicines exactly as prescribed when you left the hospital last week Come back to the ER if you see increased bleeding (nose, urine, stool), or if you have spreading bruising or pain You will need to have your family doctor recheck your hemoglobin (red blood) level in 1 week to make sure it is not dropping .

## 2019-10-14 NOTE — ED Triage Notes (Signed)
C/o abd bruising since being discharged on 11/10 for cardiac stent placement.  Pt thinks she may be taking a blood thinner but doesn't have medication list with her.  Denies abd pain.

## 2019-10-14 NOTE — ED Notes (Signed)
Pt has been stuck by 2 people so far, she is not hard to stick, her veins are just difficult to get flash back with. She has someone at her bedside to attempt the blood draw for labs that has been ordered. Pt has been cooperative with staff in allowing each attempt.

## 2019-10-16 NOTE — Research (Signed)
V2  Pt tolerated infusion well. No complaints of cp or sob.                                    "CONSENT"   YES     NO   Continuing further Investigational Product and study visits for follow-up? [x]  []   Continuing consent from future biomedical research [x]  []                                    "EVENTS"    YES     NO  AE   (IF YES SEE SOURCE) []  [x]   SAE  (IF YES SEE SOURCE) []  [x]   ENDPOINT   (IF YES SEE SOURCE) []  [x]   REVASCULARIZATION  (IF YES SEE SOURCE) []  [x]   AMPUTATION   (IF YES SEE SOURCE) []  [x]   TROPONIN'S  (IF YES SEE SOURCE) []  [x]    labs:

## 2019-10-17 ENCOUNTER — Ambulatory Visit (HOSPITAL_COMMUNITY)
Admission: RE | Admit: 2019-10-17 | Discharge: 2019-10-17 | Disposition: A | Discharge status: Home / Self Care | Payer: BC Managed Care – PPO | Source: Ambulatory Visit | Attending: Internal Medicine | Admitting: Internal Medicine

## 2019-10-17 ENCOUNTER — Encounter: Payer: BC Managed Care – PPO | Admitting: *Deleted

## 2019-10-17 ENCOUNTER — Other Ambulatory Visit: Payer: Self-pay

## 2019-10-17 VITALS — BP 129/82 | HR 75

## 2019-10-17 DIAGNOSIS — Z006 Encounter for examination for normal comparison and control in clinical research program: Secondary | ICD-10-CM

## 2019-10-17 MED ORDER — STUDY - AEGIS II STUDY - PLACEBO OR CSL112 (PI-HILTY)
170.0000 mL | Freq: Once | INTRAVENOUS | Status: AC
  Administered 2019-10-17: 170 mL via INTRAVENOUS
  Filled 2019-10-17: qty 170

## 2019-10-19 NOTE — Research (Signed)
V3 Patient doing well, no complaints of cp or sob.  Patient was seen in ER over weekend due to bruising on abdomen.                                     "CONSENT"   YES     NO   Continuing further Investigational Product and study visits for follow-up? [x]  []   Continuing consent from future biomedical research [x]  []                                    "EVENTS"    YES     NO  AE   (IF YES SEE SOURCE) []  [x]   SAE  (IF YES SEE SOURCE) []  [x]   ENDPOINT   (IF YES SEE SOURCE) []  [x]   REVASCULARIZATION  (IF YES SEE SOURCE) []  [x]   AMPUTATION   (IF YES SEE SOURCE) []  [x]   TROPONIN'S  (IF YES SEE SOURCE) []  [x]     Current Outpatient Medications:  .  aspirin 81 MG chewable tablet, Chew 1 tablet (81 mg total) by mouth daily., Disp:  , Rfl:  .  atorvastatin (LIPITOR) 40 MG tablet, Take 1 tablet by mouth once daily, Disp: 90 tablet, Rfl: 0 .  Continuous Blood Gluc Sensor (FREESTYLE LIBRE 14 DAY SENSOR) MISC, 1 each by Does not apply route every 14 (fourteen) days. Change every 2 weeks, Disp: 2 each, Rfl: 11 .  gabapentin (NEURONTIN) 300 MG capsule, Take 1 capsule (300 mg total) by mouth 3 (three) times daily., Disp: 90 capsule, Rfl: 3 .  glucose blood (BAYER CONTOUR TEST) test strip, TEST BLOOD SUGARS 3 TIMES DAILY, Disp: 200 each, Rfl: 11 .  insulin NPH Human (NOVOLIN N RELION) 100 UNIT/ML injection, Inject 0.2-0.55 mLs (20-55 Units total) into the skin 2 (two) times daily before a meal., Disp: , Rfl:  .  insulin regular (NOVOLIN R) 100 units/mL injection, Inject 10-50 Units into the skin daily. Patient is mixing with Novolin N to dose twice daily, Disp: , Rfl:  .  irbesartan (AVAPRO) 75 MG tablet, Take 1 tablet (75 mg total) by mouth daily., Disp: 30 tablet, Rfl: 1 .  Lancet Devices (EASY TOUCH LANCING DEVICE) MISC, Use to check diabetes 4 times daily  E 11.65, Disp: 1 each, Rfl: 11 .  latanoprost (XALATAN) 0.005 % ophthalmic solution, Place 1 drop into both eyes at bedtime. , Disp: , Rfl:  .   metFORMIN (GLUCOPHAGE) 1000 MG tablet, TAKE 1 TABLET BY MOUTH TWICE DAILY WITH MEALS, Disp: 120 tablet, Rfl: 0 .  metoprolol succinate (TOPROL-XL) 50 MG 24 hr tablet, Take 1 tablet (50 mg total) by mouth daily. Take with or immediately following a meal., Disp: 30 tablet, Rfl: 1 .  Multiple Vitamin (MULTIVITAMIN) tablet, Take 1 tablet by mouth daily., Disp: , Rfl:  .  Multiple Vitamins-Minerals (MULTIVITAMIN WITH MINERALS) tablet, Take 1 tablet by mouth daily., Disp: , Rfl:  .  nystatin (NYSTATIN) powder, Apply powder to affected area twice daily until rash has resolved, Disp: 15 g, Rfl: 3 .  omeprazole (PRILOSEC) 40 MG capsule, Take 1 capsule by mouth once daily, Disp: 90 capsule, Rfl: 3 .  oxyCODONE-acetaminophen (PERCOCET) 7.5-325 MG tablet, Take 1 tablet by mouth 4 (four) times daily as needed., Disp: , Rfl:  .  ticagrelor (BRILINTA) 90 MG TABS tablet, Take 1  tablet (90 mg total) by mouth 2 (two) times daily., Disp: 60 tablet, Rfl: 1 .  tiZANidine (ZANAFLEX) 2 MG tablet, TAKE 1 TABLET BY MOUTH EVERY 6 HOURS AS NEEDED FOR MUSCLE SPASM, Disp: 30 tablet, Rfl: 0 .  vitamin B-12 (CYANOCOBALAMIN) 1000 MCG tablet, Take 1,000 mcg by mouth daily., Disp: , Rfl:

## 2019-10-20 ENCOUNTER — Encounter: Payer: Self-pay | Admitting: Cardiology

## 2019-10-20 ENCOUNTER — Ambulatory Visit (INDEPENDENT_AMBULATORY_CARE_PROVIDER_SITE_OTHER): Payer: BC Managed Care – PPO | Admitting: Cardiology

## 2019-10-20 ENCOUNTER — Other Ambulatory Visit: Payer: Self-pay

## 2019-10-20 VITALS — BP 136/82 | HR 82 | Temp 97.6°F | Ht 64.0 in | Wt 190.4 lb

## 2019-10-20 DIAGNOSIS — E782 Mixed hyperlipidemia: Secondary | ICD-10-CM

## 2019-10-20 DIAGNOSIS — I25118 Atherosclerotic heart disease of native coronary artery with other forms of angina pectoris: Secondary | ICD-10-CM | POA: Diagnosis not present

## 2019-10-20 DIAGNOSIS — Z006 Encounter for examination for normal comparison and control in clinical research program: Secondary | ICD-10-CM | POA: Diagnosis not present

## 2019-10-20 DIAGNOSIS — I10 Essential (primary) hypertension: Secondary | ICD-10-CM

## 2019-10-20 NOTE — Patient Instructions (Signed)
Get blood work done at [facility] in 1 month.

## 2019-10-20 NOTE — Progress Notes (Signed)
Primary Physician/Referring:  Crecencio Mc, MD  Patient ID: [name], female    DOB: Nov 27, 1961, 58 y.o.   MRN: [id number]  Chief Complaint  Patient presents with  . Hypertension  . Coronary Artery Disease  . non-ST elevated myocardial infarction  . Hospitalization Follow-up   HPI:    [name]  is a 58 y.o. AAF  with uncontrolled diabetes mellitus, hyperlipidemia, admitted to the hospital on 10/08/2019 with NSTEMI, exertional chest discomfort and dyspnea over the past 2 to 3 months, has been worsening gradually.  Chest pain described as tightness sometimes sharp in the middle of the chest, radiating to her right shoulder and also to her right arm and also to the right side of her neck. She has had severe radicular pain involving the neck and also right shoulder but has also had retrosternal chest pain associated with shortness of breath with exertion activity.  She underwent successful angioplasty and stenting to the midright and also to the distal LAD by implantation of DES on 10/09/2019.  She has not had any further chest pain but continues to have severe pain in her neck and also her arms constantly.  She has been scheduled to be evaluated by neurosurgery but is having heart time trying to get it scheduled.  She is presently taken off of work and is scheduled to return to work on 10/31/2019 but is concerned about returning to work in view of constant and severe neck and right arm pain.  States that since hospital discharge she is made significant amount of lifestyle changes with regard to her eating habits.  States her blood sugar has also improved.  She has not used any sublingual nitroglycerin.  Past Medical History:  Diagnosis Date  . Arthritis   . Campylobacter enteritis May 2012   [facility] admission  . Carpal tunnel syndrome, bilateral   . Diabetes mellitus   . Gastritis with bleeding due to alcohol 2010   [facility] admission  . GERD (gastroesophageal reflux disease)   .  Hyperlipidemia   . Hypertension    no meds  . Neck pain   . Wears glasses    Past Surgical History:  Procedure Laterality Date  . CARPAL TUNNEL RELEASE Left 05/14/2014   Procedure: LEFT ULNAR NEUROPLASTY AT ELBOW AND ENDOSCOPIC CARPAL TUNNEL RELEASE;  Surgeon: Jolyn Nap, MD;  Location: [place];  Service: Orthopedics;  Laterality: Left;  . CORONARY STENT INTERVENTION N/A 10/09/2019   Procedure: CORONARY STENT INTERVENTION;  Surgeon: Adrian Prows, MD;  Location: [place] [facility];  Service: Cardiovascular;  Laterality: N/A;  . CYST EXCISION  1995   under arms  . endometrial biopsy  2004   benign  . FOOT OSTEOTOMY  2010   right-spurs  . LEFT HEART CATH AND CORONARY ANGIOGRAPHY N/A 10/09/2019   Procedure: LEFT HEART CATH AND CORONARY ANGIOGRAPHY;  Surgeon: Adrian Prows, MD;  Location: [place] [facility];  Service: Cardiovascular;  Laterality: N/A;  . TONSILLECTOMY    . ULNAR NERVE TRANSPOSITION Left 05/14/2014   Procedure: ULNAR NERVE DECOMPRESSION/TRANSPOSITION;  Surgeon: Jolyn Nap, MD;  Location: [place];  Service: Orthopedics;  Laterality: Left;   Social History   Socioeconomic History  . Marital status: Single    Spouse name: Not on file  . Number of children: 2  . Years of education: 68  . Highest education level: High school graduate  Occupational History  . Not on file  Social Needs  .  Financial resource strain: Not on file  . Food insecurity    Worry: Not on file    Inability: Not on file  . Transportation needs    Medical: Not on file    Non-medical: Not on file  Tobacco Use  . Smoking status: Former Smoker    Quit date: 05/09/2012    Years since quitting: 7.4  . Smokeless tobacco: Never Used  Substance and Sexual Activity  . Alcohol use: Not Currently  . Drug use: Yes    Types: Marijuana    Comment: sometimes  . Sexual activity: Yes    Comment: same partner x 10 years  Lifestyle  . Physical activity    Days per  week: Not on file    Minutes per session: Not on file  . Stress: Not on file  Relationships  . Social Herbalist on phone: Not on file    Gets together: Not on file    Attends religious service: Not on file    Active member of club or organization: Not on file    Attends meetings of clubs or organizations: Not on file    Relationship status: Not on file  . Intimate partner violence    Fear of current or ex partner: Not on file    Emotionally abused: Not on file    Physically abused: Not on file    Forced sexual activity: Not on file  Other Topics Concern  . Not on file  Social History Narrative   She works as a Sports coach.   Right-handed.   One cup caffeine per day.   She lives at home with daughter.   ROS  Review of Systems  Constitution: Negative for chills, decreased appetite, malaise/fatigue and weight gain.  Cardiovascular: Negative for chest pain, dyspnea on exertion, leg swelling and syncope.  Endocrine: Negative for cold intolerance.  Hematologic/Lymphatic: Does not bruise/bleed easily.  Musculoskeletal: Positive for joint pain (right shoulder) and neck pain. Negative for joint swelling.  Gastrointestinal: Negative for abdominal pain, anorexia, change in bowel habit, hematochezia and melena.  Neurological: Positive for sensory change (right arm). Negative for headaches and light-headedness.  Psychiatric/Behavioral: Negative for depression and substance abuse.  All other systems reviewed and are negative.  Objective   Vitals with BMI 10/20/2019 10/17/2019 10/17/2019  Height '5\' 4"'$  '5\' 4"'$  -  Weight 190 lbs 6 oz 189 lbs -  BMI 46.56 81.27 -  Systolic 517 001 749  Diastolic 82 82 82  Pulse 82 75 75      Physical Exam  Constitutional:  She is moderately built and mildly obese in no acute distress.  HENT:  Head: Atraumatic.  Eyes: Conjunctivae are normal.  Neck: Neck supple. No JVD present. No thyromegaly present.  Cardiovascular: Normal rate, regular  rhythm, normal heart sounds and intact distal pulses. Exam reveals no gallop.  No murmur heard. Pulses:      Carotid pulses are 2+ on the right side and 2+ on the left side.      Femoral pulses are 2+ on the right side and 2+ on the left side.      Dorsalis pedis pulses are 1+ on the right side and 1+ on the left side.       Posterior tibial pulses are 1+ on the right side and 1+ on the left side.  No leg edema, no JVD.  Pulmonary/Chest: Effort normal and breath sounds normal.  Abdominal: Soft. Bowel sounds are normal.  Musculoskeletal: Normal range  of motion.  Neurological: She is alert.  Skin: Skin is warm and dry.  Psychiatric: She has a normal mood and affect.   Laboratory examination:   Recent Labs    10/08/19 2123 10/10/19 0959 10/14/19 1025  NA 140 138 136  K 3.7 3.8 4.0  CL 106 103 102  CO2 '24 26 24  '$ GLUCOSE 245* 296* 180*  BUN '11 6 12  '$ CREATININE 0.73 0.66 0.57  CALCIUM 9.2 8.8* 8.7*  GFRNONAA >60 >60 >60  GFRAA >60 >60 >60   estimated creatinine clearance is 80.6 mL/min (by C-G formula based on SCr of 0.57 mg/dL).  CMP Latest Ref Rng & Units 10/14/2019 10/10/2019 10/08/2019  Glucose 70 - 99 mg/dL 180(H) 296(H) 245(H)  BUN 6 - 20 mg/dL '12 6 11  '$ Creatinine 0.44 - 1.00 mg/dL 0.57 0.66 0.73  Sodium 135 - 145 mmol/L 136 138 140  Potassium 3.5 - 5.1 mmol/L 4.0 3.8 3.7  Chloride 98 - 111 mmol/L 102 103 106  CO2 22 - 32 mmol/L '24 26 24  '$ Calcium 8.9 - 10.3 mg/dL 8.7(L) 8.8(L) 9.2  Total Protein 6.5 - 8.1 g/dL - 6.2(L) -  Total Bilirubin 0.3 - 1.2 mg/dL - 0.7 -  Alkaline Phos 38 - 126 U/L - 89 -  AST 15 - 41 U/L - 14(L) -  ALT 0 - 44 U/L - 17 -   CBC Latest Ref Rng & Units 10/14/2019 10/10/2019 10/08/2019  WBC 4.0 - 10.5 K/uL 9.7 8.3 11.3(H)  Hemoglobin 12.0 - 15.0 g/dL 11.2(L) 12.1 12.3  Hematocrit 36.0 - 46.0 % 34.8(L) 37.0 38.6  Platelets 150 - 400 K/uL 306 273 347   Lipid Panel     Component Value Date/Time   CHOL 172 10/09/2019 1000   TRIG 132 10/09/2019  1000   HDL 39 (L) 10/09/2019 1000   CHOLHDL 4.4 10/09/2019 1000   VLDL 26 10/09/2019 1000   LDLCALC 107 (H) 10/09/2019 1000   LDLCALC 150 (H) 12/21/2018 1706   LDLDIRECT 94.0 09/25/2015 0956   HEMOGLOBIN A1C Lab Results  Component Value Date   HGBA1C 7.6 (A) 08/31/2019   TSH No results for input(s): TSH in the last 8760 hours. Medications and allergies   Allergies  Allergen Reactions  . Oxycodone-Acetaminophen Other (See Comments)    Causes her to feel "hot".  . Penicillins Rash    Did it involve swelling of the face/tongue/throat, SOB, or low BP? No Did it involve sudden or severe rash/hives, skin peeling, or any reaction on the inside of your mouth or nose? No Did you need to seek medical attention at a hospital or doctor's office? No When did it last happen? If all above answers are "NO", may proceed with cephalosporin use.    Current Outpatient Medications  Medication Instructions  . aspirin 81 mg, Oral, Daily  . atorvastatin (LIPITOR) 40 MG tablet Take 1 tablet by mouth once daily  . Continuous Blood Gluc Sensor (FREESTYLE LIBRE 14 DAY SENSOR) MISC 1 each, Does not apply, Every 14 days, Change every 2 weeks  . gabapentin (NEURONTIN) 300 mg, Oral, 3 times daily  . glucose blood (BAYER CONTOUR TEST) test strip TEST BLOOD SUGARS 3 TIMES DAILY  . insulin NPH Human (NOVOLIN N RELION) 20-55 Units, Subcutaneous, 2 times daily before meals  . insulin regular (NOVOLIN R) 10-50 Units, Subcutaneous, Daily, Patient is mixing with Novolin N to dose twice daily  . irbesartan (AVAPRO) 75 mg, Oral, Daily  . Lancet Devices (EASY TOUCH LANCING DEVICE)  MISC Use to check diabetes 4 times daily  E 11.65  . latanoprost (XALATAN) 0.005 % ophthalmic solution 1 drop, Both Eyes, Daily at bedtime  . metFORMIN (GLUCOPHAGE) 1000 MG tablet TAKE 1 TABLET BY MOUTH TWICE DAILY WITH MEALS  . metoprolol succinate (TOPROL-XL) 50 mg, Oral, Daily, Take with or immediately following a meal.  . Multiple  Vitamin (MULTIVITAMIN) tablet 1 tablet, Oral, Daily  . Multiple Vitamins-Minerals (MULTIVITAMIN WITH MINERALS) tablet 1 tablet, Oral, Daily  . nystatin (NYSTATIN) powder Apply powder to affected area twice daily until rash has resolved  . omeprazole (PRILOSEC) 40 MG capsule Take 1 capsule by mouth once daily  . oxyCODONE-acetaminophen (PERCOCET) 7.5-325 MG tablet 1 tablet, Oral, 4 times daily PRN  . ticagrelor (BRILINTA) 90 mg, Oral, 2 times daily  . tiZANidine (ZANAFLEX) 2 MG tablet TAKE 1 TABLET BY MOUTH EVERY 6 HOURS AS NEEDED FOR MUSCLE SPASM  . vitamin B-12 (CYANOCOBALAMIN) 1,000 mcg, Oral, Daily   Radiology:   CXR 10/08/2019:  The heart size and mediastinal contours are within normal limits. Both lungs are clear. The visualized skeletal structures are unremarkable. Cardiac Studies:   Echocardiogram 10/09/2019:   1. Left ventricular ejection fraction, by visual estimation, is 50 to 55%. The left ventricle has low normal function. There is mildly increased left ventricular hypertrophy. Basal to mid inferolateral hypokinesis  2. The average left ventricular global longitudinal strain is -14.9 %.  3. Global right ventricle has normal systolic function.The right ventricular size is normal. No increase in right ventricular wall thickness.  Left heart catheterization 10/09/2019: Hyperdynamic LV, EF 65 to 70%, EDP normal. RCA is a dominant vessel. Ulcerated tandem 80% followed by a 99% stenosis. SP 2.5 x 38 and a 2.5 x 8 mm resolute Onyx DES stents implanted, second stent used for distal edge dissection. Stenosis reduced to 0% with TIMI-3 to TIMI-3 flow. Circumflex coronary artery is smooth with minimal disease. Gives origin to large OM1 and OM 2. Tortuous. LAD is diffusely diseased from the mid to distal segment. There is a hazy 70 to 80% stenosis in the mid LAD. Following which distal LAD had a 40% diffuse disease. Mild calcification is evident. SP 2.24 x 15 mm resolute Onyx DES  postdilated with 2.25 x 8 mm [place] at 22 atmospheric pressure with 0% residual stenosis. TIMI-3 to TIMI-3 flow.  Assessment     ICD-10-CM   1. Coronary artery disease involving native coronary artery of native heart with other form of angina pectoris ([place])  I25.118 CMP14+EGFR    CBC  2. Mixed hyperlipidemia  E78.2 Lipoprotein A (LPA)    Lipid Panel With LDL/HDL Ratio  3. Essential hypertension  I10   4. Enrolled in clinical trial of drug  Z00.6    The AEGIS-II Study: A Phase 3, Multicenter, Double-blind, Randomized, Placebo-controlled, Parallel-group Study: CSL112 in ACS.    EKG 10/10/2019: normal EKG, normal sinus rhythm, Compared to 10/09/2019, inferior T wave inversion no longer present..  Recommendations:  No orders of the defined types were placed in this encounter.   Patient enrolled in "The AEGIS-II Study": A Phase 3, Multicenter, Double-blind, Randomized, Placebo-controlled, Parallel-group Study to Investigate the Efficacy and Safety of CSL112 in Subjects with Acute Coronary Syndrome. Participation in this study will last approximately 12 months inclusive of study follow-up. Enrollment 10/10/2019.  [name]  is a 58 y.o. AAF  with uncontrolled diabetes mellitus, hyperlipidemia, prior cigarette smoking quit in 2013, smokes marijuana occasionally was admitted to the hospital on 10/08/2019  with NSTEMI, underwent successful angioplasty and stenting to the midright and also to the distal LAD by implantation of DES on 10/09/2019.  She has not had any further chest pain but continues to have severe pain in her neck and also her arms constantly. THis is clearly radicular pain. She is concerned about return to work. I have excused her until end of this month and she is aware that neurosurgery or her PCP may have to further certify for FMLA.  She is tolerating all the medications well, blood pressure is well controlled, no bleeding diathesis on antiplatelet therapy, I'll like to repeat  CMP, CBC and also lipid profile testing in 4 weeks error like to see her back in 6 weeks for follow-up.  I have encouraged her to continue to lose weight and get her diabetes under control.  She appears very motivated.  I also used to take care of her father who passed away.  Adrian Prows, MD, [facility] 10/21/2019, 5:30 PM [facility]. [place] Pager: [phone] Office: [phone] If no answer Cell [phone]

## 2019-10-23 ENCOUNTER — Other Ambulatory Visit: Payer: Self-pay

## 2019-10-23 ENCOUNTER — Ambulatory Visit (INDEPENDENT_AMBULATORY_CARE_PROVIDER_SITE_OTHER): Payer: BC Managed Care – PPO | Admitting: Internal Medicine

## 2019-10-23 ENCOUNTER — Encounter: Payer: Self-pay | Admitting: Internal Medicine

## 2019-10-23 DIAGNOSIS — M5412 Radiculopathy, cervical region: Secondary | ICD-10-CM

## 2019-10-23 DIAGNOSIS — M4692 Unspecified inflammatory spondylopathy, cervical region: Secondary | ICD-10-CM

## 2019-10-23 DIAGNOSIS — I25118 Atherosclerotic heart disease of native coronary artery with other forms of angina pectoris: Secondary | ICD-10-CM | POA: Diagnosis not present

## 2019-10-23 DIAGNOSIS — Z09 Encounter for follow-up examination after completed treatment for conditions other than malignant neoplasm: Secondary | ICD-10-CM | POA: Diagnosis not present

## 2019-10-23 NOTE — Progress Notes (Signed)
Virtual Visit via  [email or web address]  This visit type was conducted due to national recommendations for restrictions regarding the COVID-19 pandemic (e.g. social distancing).  This format is felt to be most appropriate for this patient at this time.  All issues noted in this document were discussed and addressed.  No physical exam was performed (except for noted visual exam findings with Video Visits).   I connected with@ on 10/23/19 at 11:00 AM EST by a video enabled telemedicine application and verified that I am speaking with the correct person using two identifiers. Location patient: home Location provider: work or home office Persons participating in the virtual visit: patient, provider  I discussed the limitations, risks, security and privacy concerns of performing an evaluation and management service by telephone and the availability of in person appointments. I also discussed with the patient that there may be a patient responsible charge related to this service. The patient expressed understanding and agreed to proceed.  Reason for visit: hospital follow up   HPI:   58 yr old female with cervical spondylitis and radiculitis , uncontrolled  Type 2 DM,  admitted to hospital on  nov 8 with chest pain secondary to NSTEMI  .  S/p angioplasty and stenting of distal LAD with DES on Nov 9 . ECHO notes EF 50 to 55%   mid inferolateral hypokinesis.   She was discharged home on Nov 10 on DOAD and   Saw Dr Einar Gip for follow up on Nov 20  Enrolled in the the AEGIS II study   Neck pain :  She continues to have severe right shoulder pain radiating to wrist despite undergoing an ESI on Nov 3 in the  Right C5-6 area by [employer].    She has been taken out of work for the NSTEMI until Nov 30,  But does not have an appointment with her orthopedist until Nov 30th. She is unable to use her right arm due to severe pain with minimall movement,  ROS: See pertinent positives and negatives per HPI.  Past Medical  History:  Diagnosis Date  . Arthritis   . Campylobacter enteritis May 2012   [facility] admission  . Carpal tunnel syndrome, bilateral   . Diabetes mellitus   . Gastritis with bleeding due to alcohol 2010   [facility] admission  . GERD (gastroesophageal reflux disease)   . Hyperlipidemia   . Hypertension    no meds  . Neck pain   . Wears glasses     Past Surgical History:  Procedure Laterality Date  . CARPAL TUNNEL RELEASE Left 05/14/2014   Procedure: LEFT ULNAR NEUROPLASTY AT ELBOW AND ENDOSCOPIC CARPAL TUNNEL RELEASE;  Surgeon: Jolyn Nap, MD;  Location: [place];  Service: Orthopedics;  Laterality: Left;  . CORONARY STENT INTERVENTION N/A 10/09/2019   Procedure: CORONARY STENT INTERVENTION;  Surgeon: Adrian Prows, MD;  Location: [place] [facility];  Service: Cardiovascular;  Laterality: N/A;  . CYST EXCISION  1995   under arms  . endometrial biopsy  2004   benign  . FOOT OSTEOTOMY  2010   right-spurs  . LEFT HEART CATH AND CORONARY ANGIOGRAPHY N/A 10/09/2019   Procedure: LEFT HEART CATH AND CORONARY ANGIOGRAPHY;  Surgeon: Adrian Prows, MD;  Location: [place] [facility];  Service: Cardiovascular;  Laterality: N/A;  . TONSILLECTOMY    . ULNAR NERVE TRANSPOSITION Left 05/14/2014   Procedure: ULNAR NERVE DECOMPRESSION/TRANSPOSITION;  Surgeon: Jolyn Nap, MD;  Location: [place];  Service: Orthopedics;  Laterality: Left;    Family History  Problem Relation Age of Onset  . Hypertension Mother   . Diabetes Mother   . Heart attack Mother   . Stroke Father   . Diabetes Father   . Diabetes Maternal Grandmother   . Hypertension Maternal Grandmother   . Heart attack Sister     SOCIAL HX:  reports that she quit smoking about 7 years ago. She has never used smokeless tobacco. She reports previous alcohol use. She reports current drug use. Drug: Marijuana.   Current Outpatient Medications:  .  aspirin 81 MG chewable tablet, Chew 1 tablet (81 mg total)  by mouth daily., Disp:  , Rfl:  .  atorvastatin (LIPITOR) 40 MG tablet, Take 1 tablet by mouth once daily, Disp: 90 tablet, Rfl: 0 .  gabapentin (NEURONTIN) 300 MG capsule, Take 1 capsule (300 mg total) by mouth 3 (three) times daily., Disp: 90 capsule, Rfl: 3 .  glucose blood (BAYER CONTOUR TEST) test strip, TEST BLOOD SUGARS 3 TIMES DAILY, Disp: 200 each, Rfl: 11 .  insulin NPH Human (NOVOLIN N RELION) 100 UNIT/ML injection, Inject 0.2-0.55 mLs (20-55 Units total) into the skin 2 (two) times daily before a meal., Disp: , Rfl:  .  insulin regular (NOVOLIN R) 100 units/mL injection, Inject 10-50 Units into the skin daily. Patient is mixing with Novolin N to dose twice daily, Disp: , Rfl:  .  irbesartan (AVAPRO) 75 MG tablet, Take 1 tablet (75 mg total) by mouth daily., Disp: 30 tablet, Rfl: 1 .  Lancet Devices (EASY TOUCH LANCING DEVICE) MISC, Use to check diabetes 4 times daily  E 11.65, Disp: 1 each, Rfl: 11 .  latanoprost (XALATAN) 0.005 % ophthalmic solution, Place 1 drop into both eyes at bedtime. , Disp: , Rfl:  .  metFORMIN (GLUCOPHAGE) 1000 MG tablet, TAKE 1 TABLET BY MOUTH TWICE DAILY WITH MEALS, Disp: 120 tablet, Rfl: 0 .  metoprolol succinate (TOPROL-XL) 50 MG 24 hr tablet, Take 1 tablet (50 mg total) by mouth daily. Take with or immediately following a meal., Disp: 30 tablet, Rfl: 1 .  Multiple Vitamin (MULTIVITAMIN) tablet, Take 1 tablet by mouth daily., Disp: , Rfl:  .  nystatin (NYSTATIN) powder, Apply powder to affected area twice daily until rash has resolved, Disp: 15 g, Rfl: 3 .  omeprazole (PRILOSEC) 40 MG capsule, Take 1 capsule by mouth once daily, Disp: 90 capsule, Rfl: 3 .  oxyCODONE-acetaminophen (PERCOCET) 7.5-325 MG tablet, Take 1 tablet by mouth 4 (four) times daily as needed., Disp: , Rfl:  .  ticagrelor (BRILINTA) 90 MG TABS tablet, Take 1 tablet (90 mg total) by mouth 2 (two) times daily., Disp: 60 tablet, Rfl: 1 .  tiZANidine (ZANAFLEX) 4 MG tablet, Take 4 mg by mouth  3 (three) times daily., Disp: , Rfl:  .  vitamin B-12 (CYANOCOBALAMIN) 1000 MCG tablet, Take 1,000 mcg by mouth daily., Disp: , Rfl:  .  Continuous Blood Gluc Sensor (FREESTYLE LIBRE 14 DAY SENSOR) MISC, 1 each by Does not apply route every 14 (fourteen) days. Change every 2 weeks (Patient not taking: Reported on 10/20/2019), Disp: 2 each, Rfl: 11  EXAM:  VITALS per patient if applicable:  GENERAL: alert, oriented, appears well and in no acute distress  HEENT: atraumatic, conjunttiva clear, no obvious abnormalities on inspection of external nose and ears  NECK: normal movements of the head and neck  LUNGS: on inspection no signs of respiratory distress, breathing rate appears normal, no obvious gross SOB,  gasping or wheezing  CV: no obvious cyanosis  MS: moves all visible extremities without noticeable abnormality  PSYCH/NEURO: pleasant and cooperative, no obvious depression or anxiety, speech and thought processing grossly intact  ASSESSMENT AND PLAN:  Discussed the following assessment and plan:  Coronary artery disease involving native coronary artery of native heart with other form of angina pectoris (HCC)  Cervical spondylitis with radiculitis [facility])  Hospital discharge follow-up  CAD (coronary artery disease) With recent admission for NSTEMI resulting in PTCA/stent of LAD  Cervical spondylitis with radiculitis ([place]) Unclear how much of her pain is originating from the spine vs the neck.  Orthopedics evaluation will likely result in MRI shoulder   Hospital discharge follow-up Patient is stable post discharge and has no new issues or questions about discharge plans at the visit today for hospital follow up. All labs , imaging studies and progress notes from admission were reviewed with patient today      I discussed the assessment and treatment plan with the patient. The patient was provided an opportunity to ask questions and all were answered. The patient agreed with  the plan and demonstrated an understanding of the instructions.   The patient was advised to call back or seek an in-person evaluation if the symptoms worsen or if the condition fails to improve as anticipated.  I provided  25 minutes of non-face-to-face time during this encounter reviewing patient's current problems and post surgeries.  Providing counseling on the above mentioned problems , and coordination  of care . Crecencio Mc, MD

## 2019-10-23 NOTE — Assessment & Plan Note (Signed)
With recent admission for NSTEMI resulting in PTCA/stent of LAD

## 2019-10-23 NOTE — Assessment & Plan Note (Signed)
Patient is stable post discharge and has no new issues or questions about discharge plans at the visit today for hospital follow up. All labs , imaging studies and progress notes from admission were reviewed with patient today   

## 2019-10-23 NOTE — Assessment & Plan Note (Addendum)
Unclear how much of her pain is originating from the spine vs the neck.  Orthopedics evaluation will likely result in MRI shoulder

## 2019-10-24 ENCOUNTER — Ambulatory Visit (HOSPITAL_COMMUNITY)
Admission: RE | Admit: 2019-10-24 | Discharge: 2019-10-24 | Disposition: A | Discharge status: Home / Self Care | Payer: BC Managed Care – PPO | Source: Ambulatory Visit | Attending: Internal Medicine | Admitting: Internal Medicine

## 2019-10-24 ENCOUNTER — Encounter: Payer: BC Managed Care – PPO | Admitting: *Deleted

## 2019-10-24 VITALS — BP 142/79 | HR 94

## 2019-10-24 DIAGNOSIS — Z006 Encounter for examination for normal comparison and control in clinical research program: Secondary | ICD-10-CM | POA: Insufficient documentation

## 2019-10-24 MED ORDER — STUDY - AEGIS II STUDY - PLACEBO OR CSL112 (PI-HILTY)
170.0000 mL | Freq: Once | INTRAVENOUS | Status: AC
  Administered 2019-10-24: 170 mL via INTRAVENOUS
  Filled 2019-10-24: qty 170

## 2019-10-25 NOTE — Research (Signed)
AEGIS V4  Patient doing well, no complaints of cp or sob. She is getting ready for Thanksgiving.                                    "CONSENT"   YES     NO   Continuing further Investigational Product and study visits for follow-up? [x]  []   Continuing consent from future biomedical research [x]  []                                    "EVENTS"    YES     NO  AE   (IF YES SEE SOURCE) []  [x]   SAE  (IF YES SEE SOURCE) []  [x]   ENDPOINT   (IF YES SEE SOURCE) []  [x]   REVASCULARIZATION  (IF YES SEE SOURCE) []  [x]   AMPUTATION   (IF YES SEE SOURCE) []  [x]   TROPONIN'S  (IF YES SEE SOURCE) []  [x]     Current Outpatient Medications:  .  aspirin 81 MG chewable tablet, Chew 1 tablet (81 mg total) by mouth daily., Disp:  , Rfl:  .  atorvastatin (LIPITOR) 40 MG tablet, Take 1 tablet by mouth once daily, Disp: 90 tablet, Rfl: 0 .  Continuous Blood Gluc Sensor (FREESTYLE LIBRE 14 DAY SENSOR) MISC, 1 each by Does not apply route every 14 (fourteen) days. Change every 2 weeks (Patient not taking: Reported on 10/20/2019), Disp: 2 each, Rfl: 11 .  gabapentin (NEURONTIN) 300 MG capsule, Take 1 capsule (300 mg total) by mouth 3 (three) times daily., Disp: 90 capsule, Rfl: 3 .  glucose blood (BAYER CONTOUR TEST) test strip, TEST BLOOD SUGARS 3 TIMES DAILY, Disp: 200 each, Rfl: 11 .  insulin NPH Human (NOVOLIN N RELION) 100 UNIT/ML injection, Inject 0.2-0.55 mLs (20-55 Units total) into the skin 2 (two) times daily before a meal., Disp: , Rfl:  .  insulin regular (NOVOLIN R) 100 units/mL injection, Inject 10-50 Units into the skin daily. Patient is mixing with Novolin N to dose twice daily, Disp: , Rfl:  .  irbesartan (AVAPRO) 75 MG tablet, Take 1 tablet (75 mg total) by mouth daily., Disp: 30 tablet, Rfl: 1 .  Lancet Devices (EASY TOUCH LANCING DEVICE) MISC, Use to check diabetes 4 times daily  E 11.65, Disp: 1 each, Rfl: 11 .  latanoprost (XALATAN) 0.005 % ophthalmic solution, Place 1 drop into both eyes at bedtime. ,  Disp: , Rfl:  .  metFORMIN (GLUCOPHAGE) 1000 MG tablet, TAKE 1 TABLET BY MOUTH TWICE DAILY WITH MEALS, Disp: 120 tablet, Rfl: 0 .  metoprolol succinate (TOPROL-XL) 50 MG 24 hr tablet, Take 1 tablet (50 mg total) by mouth daily. Take with or immediately following a meal., Disp: 30 tablet, Rfl: 1 .  Multiple Vitamin (MULTIVITAMIN) tablet, Take 1 tablet by mouth daily., Disp: , Rfl:  .  nystatin (NYSTATIN) powder, Apply powder to affected area twice daily until rash has resolved, Disp: 15 g, Rfl: 3 .  omeprazole (PRILOSEC) 40 MG capsule, Take 1 capsule by mouth once daily, Disp: 90 capsule, Rfl: 3 .  oxyCODONE-acetaminophen (PERCOCET) 7.5-325 MG tablet, Take 1 tablet by mouth 4 (four) times daily as needed., Disp: , Rfl:  .  ticagrelor (BRILINTA) 90 MG TABS tablet, Take 1 tablet (90 mg total) by mouth 2 (two) times daily., Disp: 60 tablet, Rfl: 1 .  tiZANidine (  ZANAFLEX) 4 MG tablet, Take 4 mg by mouth 3 (three) times daily., Disp: , Rfl:  .  vitamin B-12 (CYANOCOBALAMIN) 1000 MCG tablet, Take 1,000 mcg by mouth daily., Disp: , Rfl:

## 2019-10-25 NOTE — Research (Signed)
V2  DEMOGRAPHICS:  Patient Name: [name] Birth Date: [birth year]  Sex: Female Race: African American   Child Bearing: ? Yes    ? No ? Tubial ligation ? Hysterectomy  ? postmenopausal   Height:  162cm Weight: 86 kg   Index Procedure:  Onset date of symptoms: 8-Nov-20 Onset of symptoms: 08:00  Date of First contact at hospital: 8-Nov-20 Time of first contact at hospital: 2113  Admission Date: 9-Nov-20   Discharge Date: 10-Nov-20 Discharge Time: 1519   Vital Signs: Date 10/08/2019    Time: 2119 BP: 119/64  Pulse: 85    Concomitant medications: Every visit: ? See med sheet  BMP Pre Contrast IV 10/08/19 @ 21:23  0.73  CMP Post Contrast IV 12 hours later: 10/10/2019 @ 0959 0.66 Hepatic Panel: 10/10/19 @ 0959 ALT: 17 Total Bili: 0.7 Direct Bili: ND   Medical History:  ? CAD ? Prior MI ? PAD  ? History of Heart Failure ? Moderate to severe valvular dx ? AFib  ? Prior Coronary Revascularization  if YES please select Yes or No below:  CABG ? Yes   ? No           PCI with stent ? Yes   ? No          PCI without stent ? Yes ? No  ? CVA if checked please select one of the following Choose an item.  ? Hypertension  ? Gilberts syndrome ? CKD   ? Hypocholesteremia ? DM ? Smoker    former ? eCigarette  [place] Class Stage 1     EQ-5D-3L ?  Future Biomedical Research: Consented ? Yes     ? No If no please date they withdrew consent from biomedical research Click or tap to enter a date.  [facility] Before Start of Infusion: ? Biochemistry panel      ? Hematology     ? Immunogenicity   (30 mins before infusion)   ? Parvovirus  ? FBR sample ? PK/PD sample Central [facility] End of Infusion:   ? PK/PD Central Blood Draw Time: Before SOI:  _11/10/20 @ 1245_ After EOI: _11/10/20 @ 1445__ Infusion Start Time: 10/10/2019 12:50 AM  Infusion End Time: 10/10/2019 2:40 PM  EQ-5D-5L  MOBILITY:    I HAVE NO PROBLEMS WALKING [x]   I HAVE SLIGHT PROBLEMS WALKING []   I HAVE MODERATE PROBLEMS WALKING []    I HAVE SEVERE PROBLEMS WALKING []   I AM UNABLE TO WALK  []     SELF-CARE:   I HAVE NO PROBLEMS WASING OR DRESSING MYSELF  [x]   I HAVE SLIGHT PROBLEMS WASHING OR DRESSING MYSELF  []   I HAVE MODERATE PROBLEMS WASHING OR DRESSING MYSELF []   I HAVE SEVERE PROBLEMS WASHING OR DRESSING MYSELF  []   I HAVE SEVERE PROBLEMS WASHING OR DRESSING MYSELF  []   I AM UNABLE TO WASH OR DRESS MYSELF []     USUAL ACTIVITIES: (E.G. WORK/STUDY/HOUSEWORK/FAMILY OR LEISURE ACTIVITIES.    I HAVE NO PROBLEMS DOING MY USUAL ACTIVITIES [x]   I HAVE SLIGHT PROBLEMS DOING MY USUAL ACTIVITIES []   I HAVE MODERATE PROBLEMS DOING MY USUAL ACTIVIITIES []   I HAVE SEVERE PROBLEMS DOING MY USUAL ACTIVITIES []   I AM UNABLE TO DO MY USUAL ACTIVITIES []     PAIN /DISCOMFORT   I HAVE NO PAIN OR DISCOMFORT [x]   I HAVE SLIGHT PAIN OR DISCOMFORT []   I HAVE MODERATE PAIN OR DISCOMFORT []   I HAVE SEVERE PAIN OR DISCOMFORT []   I HAVE  EXTREME PAIN OR DISCOMFORT []     ANXIETY/DEPRESSION   I AM NOT ANXIOUS OR DEPRESSED [x]   I AM SLIGHTLY ANXIOUS OR DEPRESSED []   I AM MODERATELY ANXIOUS OR DREPRESSED []   I AM SEVERELY ANXIOUS OR DEPRESSED []   I AM EXTREMELY ANXIOUS OR DEPRESSED []     SCALE OF 0-100 HOW WOULD YOU RATE TODAY?  0 IS THE WORSE AND 100 IS THE BEST HEALTH YOU CAN IMAGINE: 51

## 2019-10-31 ENCOUNTER — Other Ambulatory Visit: Payer: Self-pay

## 2019-10-31 ENCOUNTER — Encounter: Payer: BC Managed Care – PPO | Admitting: *Deleted

## 2019-10-31 ENCOUNTER — Ambulatory Visit (HOSPITAL_COMMUNITY)
Admission: RE | Admit: 2019-10-31 | Discharge: 2019-10-31 | Disposition: A | Discharge status: Home / Self Care | Payer: BC Managed Care – PPO | Source: Ambulatory Visit | Attending: Internal Medicine | Admitting: Internal Medicine

## 2019-10-31 VITALS — BP 118/92 | HR 90

## 2019-10-31 DIAGNOSIS — Z006 Encounter for examination for normal comparison and control in clinical research program: Secondary | ICD-10-CM

## 2019-10-31 MED ORDER — STUDY - AEGIS II STUDY - PLACEBO OR CSL112 (PI-HILTY)
170.0000 mL | Freq: Once | INTRAVENOUS | Status: AC
  Administered 2019-10-31: 170 mL via INTRAVENOUS
  Filled 2019-10-31: qty 170

## 2019-10-31 NOTE — Research (Signed)
AEGIS V5  Patient doing well, no complaints of cp or sob. No med changes per patient.  Bruises on abdomen looks better, will close AE.                                    "CONSENT"   YES     NO   Continuing further Investigational Product and study visits for follow-up? [x]  []   Continuing consent from future biomedical research [x]  []                                    "EVENTS"    YES     NO  AE   (IF YES SEE SOURCE) []  [x]   SAE  (IF YES SEE SOURCE) []  [x]   ENDPOINT   (IF YES SEE SOURCE) []  [x]   REVASCULARIZATION  (IF YES SEE SOURCE) []  [x]   AMPUTATION   (IF YES SEE SOURCE) []  [x]   TROPONIN'S  (IF YES SEE SOURCE) []  [x]    Central labs drawn:       Current Outpatient Medications:  .  aspirin 81 MG chewable tablet, Chew 1 tablet (81 mg total) by mouth daily., Disp:  , Rfl:  .  atorvastatin (LIPITOR) 40 MG tablet, Take 1 tablet by mouth once daily, Disp: 90 tablet, Rfl: 0 .  Continuous Blood Gluc Sensor (FREESTYLE LIBRE 14 DAY SENSOR) MISC, 1 each by Does not apply route every 14 (fourteen) days. Change every 2 weeks, Disp: 2 each, Rfl: 11 .  gabapentin (NEURONTIN) 300 MG capsule, Take 1 capsule (300 mg total) by mouth 3 (three) times daily., Disp: 90 capsule, Rfl: 3 .  glucose blood (BAYER CONTOUR TEST) test strip, TEST BLOOD SUGARS 3 TIMES DAILY, Disp: 200 each, Rfl: 11 .  insulin NPH Human (NOVOLIN N RELION) 100 UNIT/ML injection, Inject 0.2-0.55 mLs (20-55 Units total) into the skin 2 (two) times daily before a meal., Disp: , Rfl:  .  insulin regular (NOVOLIN R) 100 units/mL injection, Inject 10-50 Units into the skin daily. Patient is mixing with Novolin N to dose twice daily, Disp: , Rfl:  .  irbesartan (AVAPRO) 75 MG tablet, Take 1 tablet (75 mg total) by mouth daily., Disp: 30 tablet, Rfl: 1 .  Lancet Devices (EASY TOUCH LANCING DEVICE) MISC, Use to check diabetes 4 times daily  E 11.65, Disp: 1 each, Rfl: 11 .  latanoprost (XALATAN) 0.005 % ophthalmic solution, Place 1 drop into  both eyes at bedtime. , Disp: , Rfl:  .  metFORMIN (GLUCOPHAGE) 1000 MG tablet, TAKE 1 TABLET BY MOUTH TWICE DAILY WITH MEALS, Disp: 120 tablet, Rfl: 0 .  metoprolol succinate (TOPROL-XL) 50 MG 24 hr tablet, Take 1 tablet (50 mg total) by mouth daily. Take with or immediately following a meal., Disp: 30 tablet, Rfl: 1 .  Multiple Vitamin (MULTIVITAMIN) tablet, Take 1 tablet by mouth daily., Disp: , Rfl:  .  nystatin (NYSTATIN) powder, Apply powder to affected area twice daily until rash has resolved, Disp: 15 g, Rfl: 3 .  omeprazole (PRILOSEC) 40 MG capsule, Take 1 capsule by mouth once daily, Disp: 90 capsule, Rfl: 3 .  oxyCODONE-acetaminophen (PERCOCET) 7.5-325 MG tablet, Take 1 tablet by mouth 4 (four) times daily as needed., Disp: , Rfl:  .  ticagrelor (BRILINTA) 90 MG TABS tablet, Take 1 tablet (90 mg total) by mouth 2 (two)  times daily., Disp: 60 tablet, Rfl: 1 .  tiZANidine (ZANAFLEX) 4 MG tablet, Take 4 mg by mouth 3 (three) times daily., Disp: , Rfl:  .  vitamin B-12 (CYANOCOBALAMIN) 1000 MCG tablet, Take 1,000 mcg by mouth daily., Disp: , Rfl:

## 2019-11-07 ENCOUNTER — Encounter: Payer: BC Managed Care – PPO | Admitting: *Deleted

## 2019-11-07 ENCOUNTER — Other Ambulatory Visit: Payer: Self-pay | Admitting: *Deleted

## 2019-11-07 ENCOUNTER — Other Ambulatory Visit: Payer: Self-pay

## 2019-11-07 VITALS — BP 124/62 | HR 85 | Wt 194.8 lb

## 2019-11-07 DIAGNOSIS — Z006 Encounter for examination for normal comparison and control in clinical research program: Secondary | ICD-10-CM

## 2019-11-07 MED ORDER — METOPROLOL SUCCINATE ER 50 MG PO TB24: 50.0000 mg | ORAL_TABLET | Freq: Every day | ORAL | 3 refills | Status: DC

## 2019-11-07 MED ORDER — IRBESARTAN 75 MG PO TABS: 75.0000 mg | ORAL_TABLET | Freq: Every day | ORAL | 3 refills | Status: DC

## 2019-11-07 NOTE — Research (Signed)
V6   Patient doing well, no complaints of cp or sob. Medications updated and sent in refills for [place] for her Irbesartin and Metoprolol.  11/08/2019 called in [place] for patient to her normal pharmacy.   Dr Lia Foyer saw patient or her physical exam                                   "CONSENT"   YES     NO   Continuing further Investigational Product and study visits for follow-up? [x]  []   Continuing consent from future biomedical research [x]  []                                    "EVENTS"    YES     NO  AE   (IF YES SEE SOURCE) []  [x]   SAE  (IF YES SEE SOURCE) []  [x]   ENDPOINT   (IF YES SEE SOURCE) []  [x]   REVASCULARIZATION  (IF YES SEE SOURCE) []  [x]   AMPUTATION   (IF YES SEE SOURCE) []  [x]   TROPONIN'S  (IF YES SEE SOURCE) []  [x]    Central Labs drawn today:       Current Outpatient Medications:  .  aspirin 81 MG chewable tablet, Chew 1 tablet (81 mg total) by mouth daily., Disp:  , Rfl:  .  atorvastatin (LIPITOR) 40 MG tablet, Take 1 tablet by mouth once daily, Disp: 90 tablet, Rfl: 0 .  Continuous Blood Gluc Sensor (FREESTYLE LIBRE 14 DAY SENSOR) MISC, 1 each by Does not apply route every 14 (fourteen) days. Change every 2 weeks, Disp: 2 each, Rfl: 11 .  diazepam (VALIUM) 5 MG tablet, TAKE 1 TO 2 TABLETS BY MOUTH 30 MINUTES BEFORE ESI, Disp: , Rfl:  .  gabapentin (NEURONTIN) 300 MG capsule, Take 1 capsule (300 mg total) by mouth 3 (three) times daily., Disp: 90 capsule, Rfl: 3 .  glucose blood (BAYER CONTOUR TEST) test strip, TEST BLOOD SUGARS 3 TIMES DAILY, Disp: 200 each, Rfl: 11 .  insulin NPH Human (NOVOLIN N RELION) 100 UNIT/ML injection, Inject 0.2-0.55 mLs (20-55 Units total) into the skin 2 (two) times daily before a meal., Disp: , Rfl:  .  insulin regular (NOVOLIN R) 100 units/mL injection, Inject 10-50 Units into the skin daily. Patient is mixing with Novolin N to dose twice daily, Disp: , Rfl:  .  Lancet Devices (EASY TOUCH LANCING DEVICE) MISC, Use to check diabetes  4 times daily  E 11.65, Disp: 1 each, Rfl: 11 .  latanoprost (XALATAN) 0.005 % ophthalmic solution, Place 1 drop into both eyes at bedtime. , Disp: , Rfl:  .  metFORMIN (GLUCOPHAGE) 1000 MG tablet, TAKE 1 TABLET BY MOUTH TWICE DAILY WITH MEALS, Disp: 120 tablet, Rfl: 0 .  Multiple Vitamin (MULTIVITAMIN) tablet, Take 1 tablet by mouth daily., Disp: , Rfl:  .  nystatin (NYSTATIN) powder, Apply powder to affected area twice daily until rash has resolved, Disp: 15 g, Rfl: 3 .  omeprazole (PRILOSEC) 40 MG capsule, Take 1 capsule by mouth once daily, Disp: 90 capsule, Rfl: 3 .  oxyCODONE-acetaminophen (PERCOCET) 7.5-325 MG tablet, Take 1 tablet by mouth 4 (four) times daily as needed., Disp: , Rfl:  .  ticagrelor (BRILINTA) 90 MG TABS tablet, Take 1 tablet (90 mg total) by mouth 2 (two) times daily., Disp: 60 tablet, Rfl: 1 .  tiZANidine (  ZANAFLEX) 4 MG tablet, Take 4 mg by mouth 3 (three) times daily., Disp: , Rfl:  .  vitamin B-12 (CYANOCOBALAMIN) 1000 MCG tablet, Take 1,000 mcg by mouth daily., Disp: , Rfl:  .  irbesartan (AVAPRO) 75 MG tablet, Take 1 tablet (75 mg total) by mouth daily., Disp: 30 tablet, Rfl: 3 .  metoprolol succinate (TOPROL-XL) 50 MG 24 hr tablet, Take 1 tablet (50 mg total) by mouth daily. Take with or immediately following a meal., Disp: 30 tablet, Rfl: 3

## 2019-11-08 ENCOUNTER — Telehealth: Payer: Self-pay | Admitting: *Deleted

## 2019-11-08 ENCOUNTER — Other Ambulatory Visit: Payer: Self-pay | Admitting: *Deleted

## 2019-11-08 MED ORDER — TICAGRELOR 90 MG PO TABS: 90.0000 mg | ORAL_TABLET | Freq: Two times a day (BID) | ORAL | 3 refills | Status: DC

## 2019-11-08 NOTE — Telephone Encounter (Signed)
Copied from [place] [id number]. Topic: General - Call Back - No Documentation >> Nov 08, 2019  9:56 AM [name] wrote: Reason for CRM:   Pt states that she is returning a call to Dr. Derrel Nip regarding her job.

## 2019-11-09 MED ORDER — NYSTATIN 100000 UNIT/GM EX POWD: CUTANEOUS | 3 refills | Status: DC

## 2019-11-09 NOTE — Telephone Encounter (Signed)
Spoke with pt and she stated that she had called originally because she had seen Dr. Kerri Perches and she wanted to let Dr. Derrel Nip know that she took the her out of work until the 15th. Pt also stated that she was going to see if Dr. Kerri Perches would fill out the paperwork for her job. Pt also asked for a refill on the nystatin powder. Medication has been refilled and pt is aware.

## 2019-11-09 NOTE — Telephone Encounter (Signed)
Pt called back returning your call °

## 2019-11-09 NOTE — Telephone Encounter (Signed)
Mailbox is full.

## 2019-11-14 ENCOUNTER — Encounter: Payer: Self-pay | Admitting: *Deleted

## 2019-11-14 ENCOUNTER — Telehealth: Payer: Self-pay | Admitting: *Deleted

## 2019-11-14 NOTE — Telephone Encounter (Signed)
Called patient to ask about menopause. She said she is going through menopause now.

## 2019-11-17 ENCOUNTER — Telehealth (HOSPITAL_COMMUNITY): Payer: Self-pay

## 2019-11-17 NOTE — Telephone Encounter (Signed)
Called to advise pt that given the surge/increase in the Covid-19 cases and hospitalizations, our [place] senior leadership has asked [name] to halt onsite Cardiac and Pulmonary rehab exercise sessions and move patients to the Virtual app we have with [employer]. The reason for this is so that department staff can be deployed to the inpatient nursing floors to assist those teams in need. Leadership anticipates this need will be 30-60 days however, it could be extended if needed. Advised pt to attend the Orientation assessment appt for 12/04/2018@2 :00pm. At that time she will receive additional information about our [employer]. Pt verbalized understanding and is in agreement.

## 2019-11-27 ENCOUNTER — Telehealth: Payer: Self-pay | Admitting: Neurology

## 2019-11-27 MED ORDER — NYSTATIN 100000 UNIT/GM EX POWD: CUTANEOUS | 3 refills | Status: DC

## 2019-11-27 NOTE — Telephone Encounter (Signed)
Pt stopped by today regarding questions about paperwork that needs to be filled out. She was informed that her PCP would be the best option for her to speak with, but still requested that the nurse gives her a call.

## 2019-11-27 NOTE — Telephone Encounter (Signed)
I returned the call to the patient who states she is still out of work.  She was seen here for an office visit on 08/29/2019 and NCV/EMG on 09/27/2019.  A neurological cause for her pain was not found.  Dr. Krista Blue released her back to her pain management doctor and PCP.  She verbalized understanding that she needs to speak to her active physicians to discuss any ppw needed or further time out of work.

## 2019-11-27 NOTE — Telephone Encounter (Signed)
Nystatin  Refilled.  I can't do the short term disability without the  office notes fro m  Dr Kerri Perches , which I do not have

## 2019-11-27 NOTE — Telephone Encounter (Signed)
Pt was told that her primary care need to complete short term disability, that Dr. Derrel Nip knows what has been going on. FYI: over the course of time of patient being out of work different physicians have written her out of work.

## 2019-11-27 NOTE — Addendum Note (Signed)
Addended by: Crecencio Mc on: 11/27/2019 04:53 PM   Modules accepted: Orders

## 2019-11-27 NOTE — Telephone Encounter (Signed)
Pt is wanting to know if you would be able to fill out her short term disability paperwork.

## 2019-11-28 NOTE — Telephone Encounter (Signed)
Spoke with pt and she stated that she took the paperwork to Dr. Kerri Perches today. She stated that she sees her again on Monday so she will find out then if she was able to fill out the paperwork. Explained to pt that if she is not then she will need to have Dr. Kerri Perches fax the office notes to [name] so Dr. Derrel Nip could complete. Pt gave a verbal understanding.

## 2019-11-29 ENCOUNTER — Encounter: Payer: Self-pay | Admitting: Internal Medicine

## 2019-11-29 ENCOUNTER — Telehealth: Payer: Self-pay | Admitting: Internal Medicine

## 2019-11-29 ENCOUNTER — Telehealth (HOSPITAL_COMMUNITY): Payer: Self-pay

## 2019-11-29 ENCOUNTER — Ambulatory Visit (INDEPENDENT_AMBULATORY_CARE_PROVIDER_SITE_OTHER): Payer: BC Managed Care – PPO | Admitting: Internal Medicine

## 2019-11-29 DIAGNOSIS — R42 Dizziness and giddiness: Secondary | ICD-10-CM | POA: Insufficient documentation

## 2019-11-29 DIAGNOSIS — L02422 Furuncle of left axilla: Secondary | ICD-10-CM

## 2019-11-29 DIAGNOSIS — S2002XA Contusion of left breast, initial encounter: Secondary | ICD-10-CM | POA: Diagnosis not present

## 2019-11-29 DIAGNOSIS — M4692 Unspecified inflammatory spondylopathy, cervical region: Secondary | ICD-10-CM | POA: Diagnosis not present

## 2019-11-29 DIAGNOSIS — M5412 Radiculopathy, cervical region: Secondary | ICD-10-CM

## 2019-11-29 DIAGNOSIS — S2000XA Contusion of breast, unspecified breast, initial encounter: Secondary | ICD-10-CM | POA: Insufficient documentation

## 2019-11-29 MED ORDER — DOXYCYCLINE HYCLATE 100 MG PO TABS: 100.0000 mg | ORAL_TABLET | Freq: Two times a day (BID) | ORAL | 0 refills | Status: DC

## 2019-11-29 NOTE — Assessment & Plan Note (Signed)
She has been out of work since [place] 21 and will not be able to return for at least one more month . Has appt with [facility] on [place] 4  Disability forms need completion,  I will do.

## 2019-11-29 NOTE — Telephone Encounter (Signed)
Placed in red folder. Waiting to receive office notes from several of the other doctors she has seen.

## 2019-11-29 NOTE — Telephone Encounter (Signed)
Cardiac Rehab Medication Review by a Pharmacist  Does the patient  feel that his/her medications are working for him/her?  yes  Has the patient been experiencing any side effects to the medications prescribed?  Yes - off balance. Notes it could be due to tizanidine. When asked reports does take metoprolol with food and encouraged continuation of that as could lower pressure and recommended to take with food.   Does the patient measure his/her own blood pressure or blood glucose at home?  Yes - blood glucose was up this morning 220 and yesterday morning it was 100s. Usually in the 100s. Patient does not monitor blood pressure at home.   Does the patient have any problems obtaining medications due to transportation or finances?   Yes - reports $25 copay for each insulin.   Understanding of regimen: fair Understanding of indications: fair Potential of compliance: fair    Pharmacist comments: There seems to be confusion about the patient's insulin regimen and how much the patient is taking. Would recommend clarifying insulin regimen and updating prescriptions to reflect current instructions.   Cristela Felt, PharmD PGY1 Pharmacy Resident  11/29/2019 10:45 AM

## 2019-11-29 NOTE — Assessment & Plan Note (Signed)
Recommend application of cool compresses .  Bruising noted on lateral dependent area of breast.  Due to  Fall with concurrent use of Brilinta

## 2019-11-29 NOTE — Progress Notes (Signed)
Virtual Visit via [email or web address]  This visit type was conducted due to national recommendations for restrictions regarding the COVID-19 pandemic (e.g. social distancing).  This format is felt to be most appropriate for this patient at this time.  All issues noted in this document were discussed and addressed.  No physical exam was performed (except for noted visual exam findings with Video Visits).   I connected with@ on 11/29/19 at  8:00 AM EST by a video enabled telemedicine application  and verified that I am speaking with the correct person using two identifiers. Location patient: home Location provider: work or home office Persons participating in the virtual visit: patient, provider  I discussed the limitations, risks, security and privacy concerns of performing an evaluation and management service by telephone and the availability of in person appointments. I also discussed with the patient that there may be a patient responsible charge related to this service. The patient expressed understanding and agreed to proceed.   Reason for visit: recent fall, bruising of left breast  HPI:  59 yr old female with right cervical radiculitis   Under treatment for chronic debilitating pain in right arm  Recently admitted for NSTEMi and anticoagulated s/p stenting of LAD,  Had a fall on Dec 22 which bruised her left breast.  Patient reports feeling off balance a lot.  Taking oxycodone and xanaflex three times daily by pain clinic.    Left axilla also draining from a boil (has Hidradenitis suppurativa)  ROS: See pertinent positives and negatives per HPI.  Past Medical History:  Diagnosis Date  . Arthritis   . Campylobacter enteritis May 2012   [facility] admission  . Carpal tunnel syndrome, bilateral   . Diabetes mellitus   . Gastritis with bleeding due to alcohol 2010   [facility] admission  . GERD (gastroesophageal reflux disease)   . Hyperlipidemia   . Hypertension    no meds  . Menopause   . Neck pain    . Wears glasses     Past Surgical History:  Procedure Laterality Date  . CARPAL TUNNEL RELEASE Left 05/14/2014   Procedure: LEFT ULNAR NEUROPLASTY AT ELBOW AND ENDOSCOPIC CARPAL TUNNEL RELEASE;  Surgeon: Jolyn Nap, MD;  Location: [place];  Service: Orthopedics;  Laterality: Left;  . CORONARY STENT INTERVENTION N/A 10/09/2019   Procedure: CORONARY STENT INTERVENTION;  Surgeon: Adrian Prows, MD;  Location: [place] [facility];  Service: Cardiovascular;  Laterality: N/A;  . CYST EXCISION  1995   under arms  . endometrial biopsy  2004   benign  . FOOT OSTEOTOMY  2010   right-spurs  . LEFT HEART CATH AND CORONARY ANGIOGRAPHY N/A 10/09/2019   Procedure: LEFT HEART CATH AND CORONARY ANGIOGRAPHY;  Surgeon: Adrian Prows, MD;  Location: [place] [facility];  Service: Cardiovascular;  Laterality: N/A;  . TONSILLECTOMY    . ULNAR NERVE TRANSPOSITION Left 05/14/2014   Procedure: ULNAR NERVE DECOMPRESSION/TRANSPOSITION;  Surgeon: Jolyn Nap, MD;  Location: [place];  Service: Orthopedics;  Laterality: Left;    Family History  Problem Relation Age of Onset  . Hypertension Mother   . Diabetes Mother   . Heart attack Mother   . Stroke Father   . Diabetes Father   . Diabetes Maternal Grandmother   . Hypertension Maternal Grandmother   . Heart attack Sister     SOCIAL HX:  reports that she quit smoking about 7 years ago. She has never used smokeless tobacco. She reports previous alcohol  use. She reports current drug use. Drug: Marijuana.   Current Outpatient Medications:  .  aspirin 81 MG chewable tablet, Chew 1 tablet (81 mg total) by mouth daily., Disp:  , Rfl:  .  atorvastatin (LIPITOR) 40 MG tablet, Take 1 tablet by mouth once daily, Disp: 90 tablet, Rfl: 0 .  Continuous Blood Gluc Sensor (FREESTYLE LIBRE 14 DAY SENSOR) MISC, 1 each by Does not apply route every 14 (fourteen) days. Change every 2 weeks, Disp: 2 each, Rfl: 11 .  diazepam (VALIUM) 5 MG  tablet, TAKE 1 TO 2 TABLETS BY MOUTH 30 MINUTES BEFORE ESI, Disp: , Rfl:  .  gabapentin (NEURONTIN) 300 MG capsule, Take 1 capsule (300 mg total) by mouth 3 (three) times daily., Disp: 90 capsule, Rfl: 3 .  glucose blood (BAYER CONTOUR TEST) test strip, TEST BLOOD SUGARS 3 TIMES DAILY, Disp: 200 each, Rfl: 11 .  insulin NPH Human (NOVOLIN N RELION) 100 UNIT/ML injection, Inject 0.2-0.55 mLs (20-55 Units total) into the skin 2 (two) times daily before a meal., Disp: , Rfl:  .  insulin regular (NOVOLIN R) 100 units/mL injection, Inject 10-50 Units into the skin daily. Patient is mixing with Novolin N to dose twice daily, Disp: , Rfl:  .  irbesartan (AVAPRO) 75 MG tablet, Take 1 tablet (75 mg total) by mouth daily., Disp: 30 tablet, Rfl: 3 .  Lancet Devices (EASY TOUCH LANCING DEVICE) MISC, Use to check diabetes 4 times daily  E 11.65, Disp: 1 each, Rfl: 11 .  latanoprost (XALATAN) 0.005 % ophthalmic solution, Place 1 drop into both eyes at bedtime. , Disp: , Rfl:  .  metFORMIN (GLUCOPHAGE) 1000 MG tablet, TAKE 1 TABLET BY MOUTH TWICE DAILY WITH MEALS, Disp: 120 tablet, Rfl: 0 .  metoprolol succinate (TOPROL-XL) 50 MG 24 hr tablet, Take 1 tablet (50 mg total) by mouth daily. Take with or immediately following a meal., Disp: 30 tablet, Rfl: 3 .  Multiple Vitamin (MULTIVITAMIN) tablet, Take 1 tablet by mouth daily., Disp: , Rfl:  .  nystatin (NYSTATIN) powder, Apply powder to affected area twice daily until rash has resolved, Disp: 15 g, Rfl: 3 .  omeprazole (PRILOSEC) 40 MG capsule, Take 1 capsule by mouth once daily, Disp: 90 capsule, Rfl: 3 .  oxyCODONE-acetaminophen (PERCOCET) 7.5-325 MG tablet, Take 1 tablet by mouth 4 (four) times daily as needed., Disp: , Rfl:  .  ticagrelor (BRILINTA) 90 MG TABS tablet, Take 1 tablet (90 mg total) by mouth 2 (two) times daily., Disp: 60 tablet, Rfl: 3 .  tiZANidine (ZANAFLEX) 4 MG tablet, Take 4 mg by mouth 3 (three) times daily., Disp: , Rfl:  .  vitamin B-12  (CYANOCOBALAMIN) 1000 MCG tablet, Take 1,000 mcg by mouth daily., Disp: , Rfl:  .  doxycycline (VIBRA-TABS) 100 MG tablet, Take 1 tablet (100 mg total) by mouth 2 (two) times daily., Disp: 20 tablet, Rfl: 0  EXAM:  VITALS per patient if applicable:  GENERAL: alert, oriented, appears well and in no acute distress  HEENT: atraumatic, conjunttiva clear, no obvious abnormalities on inspection of external nose and ears  NECK: normal movements of the head and neck  LUNGS: on inspection no signs of respiratory distress, breathing rate appears normal, no obvious gross SOB, gasping or wheezing  CV: no obvious cyanosis  MS: moves all visible extremities without noticeable abnormality  PSYCH/NEURO: pleasant and cooperative, no obvious depression or anxiety, speech and thought processing grossly intact  ASSESSMENT AND PLAN:  Discussed the following assessment  and plan:  Furuncle of left axilla  Traumatic hematoma of female breast, left, initial encounter  Cervical spondylitis with radiculitis (HCC)  Dizziness  Boil, axilla starting doxycycline 100 mg bid x 10 days   Traumatic hematoma of female breast Recommend application of cool compresses .  Bruising noted on lateral dependent area of breast.  Due to  Fall with concurrent use of Brilinta   Cervical spondylitis with radiculitis [facility]) She has been out of work since [place] 22 and will not be able to return for at least one more month . Has appt with [facility] on [place] 4  Disability forms need completion,  I will do.   Dizziness Secondary to use of tizanadine. Patient advised to limit use during the day     I discussed the assessment and treatment plan with the patient. The patient was provided an opportunity to ask questions and all were answered. The patient agreed with the plan and demonstrated an understanding of the instructions.   The patient was advised to call back or seek an in-person evaluation if the symptoms worsen or if the  condition fails to improve as anticipated.  I provided 25 minutes of non-face-to-face time during this encounter.   Wendy Mc, MD

## 2019-11-29 NOTE — Assessment & Plan Note (Signed)
Secondary to use of tizanadine. Patient advised to limit use during the day

## 2019-11-29 NOTE — Telephone Encounter (Signed)
Pt dropped off short term disability papers. Papers are upfront in color folder.

## 2019-11-29 NOTE — Assessment & Plan Note (Addendum)
starting doxycycline 100 mg bid x 10 days

## 2019-11-30 ENCOUNTER — Other Ambulatory Visit: Payer: Self-pay | Admitting: Internal Medicine

## 2019-12-04 ENCOUNTER — Encounter: Payer: Self-pay | Admitting: *Deleted

## 2019-12-04 ENCOUNTER — Telehealth (HOSPITAL_COMMUNITY): Payer: Self-pay

## 2019-12-04 DIAGNOSIS — Z006 Encounter for examination for normal comparison and control in clinical research program: Secondary | ICD-10-CM

## 2019-12-04 NOTE — Telephone Encounter (Signed)
FYI-Pt called wanting to inform that [name] extend pt being out of work until 12/11/2019.

## 2019-12-04 NOTE — Telephone Encounter (Signed)
Successful telephone encounter to [name] to confirm Cardiac Rehab orientation appointment for 12/05/19 at 1400. Nursing assessment completed. Instructions for appointment provided. Patient screening for Covid-19 negative. [name], BSN

## 2019-12-04 NOTE — Research (Signed)
[facility] V[facility] phone visit   Patient doing well, no complaints of chest pain and or shortness of breath. Patient stated she fell Dec 22 due to dizziness and bruised her left breast. PCP said dizziness related to Zanaflex. PCP has decreased her Zanaflex, and patient says she walks with a cane of personal choice now.  Patient don't have a GYN so she will come in and do a urine pregnancy test this week for study requirements.                                     "CONSENT"   YES     NO   Continuing further Investigational Product and study visits for follow-up? [x]  []   Continuing consent from future biomedical research [x]  []                                    "EVENTS"    YES     NO  AE   (IF YES SEE SOURCE) [x]  []   SAE  (IF YES SEE SOURCE) []  [x]   ENDPOINT   (IF YES SEE SOURCE) []  [x]   REVASCULARIZATION  (IF YES SEE SOURCE) []  [x]   AMPUTATION   (IF YES SEE SOURCE) []  [x]   TROPONIN'S  (IF YES SEE SOURCE) []  [x]     Current Outpatient Medications:  .  aspirin 81 MG chewable tablet, Chew 1 tablet (81 mg total) by mouth daily., Disp:  , Rfl:  .  atorvastatin (LIPITOR) 40 MG tablet, Take 1 tablet by mouth once daily, Disp: 90 tablet, Rfl: 0 .  Continuous Blood Gluc Sensor (FREESTYLE LIBRE 14 DAY SENSOR) MISC, 1 each by Does not apply route every 14 (fourteen) days. Change every 2 weeks, Disp: 2 each, Rfl: 11 .  diazepam (VALIUM) 5 MG tablet, TAKE 1 TO 2 TABLETS BY MOUTH 30 MINUTES BEFORE ESI, Disp: , Rfl:  .  doxycycline (VIBRA-TABS) 100 MG tablet, Take 1 tablet (100 mg total) by mouth 2 (two) times daily., Disp: 20 tablet, Rfl: 0 .  gabapentin (NEURONTIN) 300 MG capsule, Take 1 capsule (300 mg total) by mouth 3 (three) times daily., Disp: 90 capsule, Rfl: 3 .  glucose blood (BAYER CONTOUR TEST) test strip, TEST BLOOD SUGARS 3 TIMES DAILY, Disp: 200 each, Rfl: 11 .  insulin NPH Human (NOVOLIN N RELION) 100 UNIT/ML injection, Inject 0.2-0.55 mLs (20-55 Units total) into the skin 2 (two) times daily before  a meal. (Patient taking differently: Inject into the skin 2 (two) times daily before a meal. 15 units in the morning and 40 units at night), Disp: , Rfl:  .  insulin regular (NOVOLIN R) 100 units/mL injection, Inject 10 Units into the skin daily. 10 units at noon, Disp: , Rfl:  .  irbesartan (AVAPRO) 75 MG tablet, Take 1 tablet (75 mg total) by mouth daily., Disp: 30 tablet, Rfl: 3 .  Lancet Devices (EASY TOUCH LANCING DEVICE) MISC, Use to check diabetes 4 times daily  E 11.65, Disp: 1 each, Rfl: 11 .  latanoprost (XALATAN) 0.005 % ophthalmic solution, Place 1 drop into both eyes at bedtime. , Disp: , Rfl:  .  metFORMIN (GLUCOPHAGE) 1000 MG tablet, TAKE 1 TABLET BY MOUTH TWICE DAILY WITH MEALS, Disp: 120 tablet, Rfl: 0 .  metoprolol succinate (TOPROL-XL) 50 MG 24 hr tablet, Take 1 tablet (50 mg total) by mouth  daily. Take with or immediately following a meal., Disp: 30 tablet, Rfl: 3 .  Multiple Vitamin (MULTIVITAMIN) tablet, Take 1 tablet by mouth daily., Disp: , Rfl:  .  nystatin (NYSTATIN) powder, Apply powder to affected area twice daily until rash has resolved, Disp: 15 g, Rfl: 3 .  omeprazole (PRILOSEC) 40 MG capsule, Take 1 capsule by mouth once daily, Disp: 90 capsule, Rfl: 3 .  oxyCODONE-acetaminophen (PERCOCET) 7.5-325 MG tablet, Take 1 tablet by mouth 4 (four) times daily as needed., Disp: , Rfl:  .  ticagrelor (BRILINTA) 90 MG TABS tablet, Take 1 tablet (90 mg total) by mouth 2 (two) times daily., Disp: 60 tablet, Rfl: 3 .  tiZANidine (ZANAFLEX) 4 MG tablet, Take 4 mg by mouth 3 (three) times daily., Disp: , Rfl:  .  vitamin B-12 (CYANOCOBALAMIN) 1000 MCG tablet, Take 1,000 mcg by mouth daily., Disp: , Rfl:

## 2019-12-04 NOTE — Telephone Encounter (Signed)
Disability form completed except for a  print out of   all visits for the last 2 years to   [facility]   a screen shot with the visits highlighted would be best   If that can't be done,  Then you'll make a list of the dates of all visits by those 4 specialists.

## 2019-12-05 ENCOUNTER — Encounter: Payer: BC Managed Care – PPO | Admitting: *Deleted

## 2019-12-05 ENCOUNTER — Telehealth: Payer: Self-pay | Admitting: Internal Medicine

## 2019-12-05 ENCOUNTER — Other Ambulatory Visit: Payer: Self-pay

## 2019-12-05 ENCOUNTER — Encounter (HOSPITAL_COMMUNITY)
Admission: RE | Admit: 2019-12-05 | Discharge: 2019-12-05 | Disposition: A | Discharge status: Home / Self Care | Payer: BC Managed Care – PPO | Source: Ambulatory Visit | Attending: Cardiology | Admitting: Cardiology

## 2019-12-05 VITALS — BP 108/78 | HR 88 | Temp 97.3°F | Ht 64.25 in | Wt 196.4 lb

## 2019-12-05 DIAGNOSIS — Z955 Presence of coronary angioplasty implant and graft: Secondary | ICD-10-CM | POA: Diagnosis present

## 2019-12-05 DIAGNOSIS — I214 Non-ST elevation (NSTEMI) myocardial infarction: Secondary | ICD-10-CM | POA: Insufficient documentation

## 2019-12-05 DIAGNOSIS — Z006 Encounter for examination for normal comparison and control in clinical research program: Secondary | ICD-10-CM

## 2019-12-05 LAB — GLUCOSE, CAPILLARY: Glucose-Capillary: 280 mg/dL — ABNORMAL HIGH (ref 70–99)

## 2019-12-05 NOTE — Telephone Encounter (Signed)
Pt dropped off a medical form to be filled by Dr. Derrel Nip. Form is up front in color folder.

## 2019-12-05 NOTE — Progress Notes (Signed)
Cardiac Individual Treatment Plan  Patient Details  Name: [name] MRN: [id number] Date of Birth: 29-Oct-1961 Referring Provider:     CARDIAC REHAB PHASE II ORIENTATION from 12/05/2019 in [place]  Referring Provider  Adrian Prows, MD      Initial Encounter Date:    CARDIAC REHAB PHASE II ORIENTATION from 12/05/2019 in [place]  Date  12/05/19      Visit Diagnosis: NSTEMI (non-ST elevated myocardial infarction) [facility])  Status post coronary artery stent placement  Patient's Home Medications on Admission:  Current Outpatient Medications:  .  aspirin 81 MG chewable tablet, Chew 1 tablet (81 mg total) by mouth daily., Disp:  , Rfl:  .  atorvastatin (LIPITOR) 40 MG tablet, Take 1 tablet by mouth once daily, Disp: 90 tablet, Rfl: 0 .  Continuous Blood Gluc Sensor (FREESTYLE LIBRE 14 DAY SENSOR) MISC, 1 each by Does not apply route every 14 (fourteen) days. Change every 2 weeks, Disp: 2 each, Rfl: 11 .  diazepam (VALIUM) 5 MG tablet, TAKE 1 TO 2 TABLETS BY MOUTH 30 MINUTES BEFORE ESI, Disp: , Rfl:  .  doxycycline (VIBRA-TABS) 100 MG tablet, Take 1 tablet (100 mg total) by mouth 2 (two) times daily., Disp: 20 tablet, Rfl: 0 .  gabapentin (NEURONTIN) 300 MG capsule, Take 1 capsule (300 mg total) by mouth 3 (three) times daily., Disp: 90 capsule, Rfl: 3 .  glucose blood (BAYER CONTOUR TEST) test strip, TEST BLOOD SUGARS 3 TIMES DAILY, Disp: 200 each, Rfl: 11 .  insulin NPH Human (NOVOLIN N RELION) 100 UNIT/ML injection, Inject 0.2-0.55 mLs (20-55 Units total) into the skin 2 (two) times daily before a meal. (Patient taking differently: Inject into the skin 2 (two) times daily before a meal. 15 units in the morning and 40 units at night), Disp: , Rfl:  .  insulin regular (NOVOLIN R) 100 units/mL injection, Inject 10 Units into the skin daily. 10 units at noon, Disp: , Rfl:  .  irbesartan (AVAPRO) 75 MG tablet, Take 1 tablet (75 mg  total) by mouth daily., Disp: 30 tablet, Rfl: 3 .  Lancet Devices (EASY TOUCH LANCING DEVICE) MISC, Use to check diabetes 4 times daily  E 11.65, Disp: 1 each, Rfl: 11 .  latanoprost (XALATAN) 0.005 % ophthalmic solution, Place 1 drop into both eyes at bedtime. , Disp: , Rfl:  .  metFORMIN (GLUCOPHAGE) 1000 MG tablet, TAKE 1 TABLET BY MOUTH TWICE DAILY WITH MEALS, Disp: 120 tablet, Rfl: 0 .  metoprolol succinate (TOPROL-XL) 50 MG 24 hr tablet, Take 1 tablet (50 mg total) by mouth daily. Take with or immediately following a meal., Disp: 30 tablet, Rfl: 3 .  Multiple Vitamin (MULTIVITAMIN) tablet, Take 1 tablet by mouth daily., Disp: , Rfl:  .  nystatin (NYSTATIN) powder, Apply powder to affected area twice daily until rash has resolved, Disp: 15 g, Rfl: 3 .  omeprazole (PRILOSEC) 40 MG capsule, Take 1 capsule by mouth once daily, Disp: 90 capsule, Rfl: 3 .  oxyCODONE-acetaminophen (PERCOCET) 7.5-325 MG tablet, Take 1 tablet by mouth 4 (four) times daily as needed., Disp: , Rfl:  .  ticagrelor (BRILINTA) 90 MG TABS tablet, Take 1 tablet (90 mg total) by mouth 2 (two) times daily., Disp: 60 tablet, Rfl: 3 .  tiZANidine (ZANAFLEX) 4 MG tablet, Take 4 mg by mouth 3 (three) times daily., Disp: , Rfl:  .  vitamin B-12 (CYANOCOBALAMIN) 1000 MCG tablet, Take 1,000 mcg by  mouth daily., Disp: , Rfl:   Past Medical History: Past Medical History:  Diagnosis Date  . Arthritis   . Campylobacter enteritis May 2012   [facility] admission  . Carpal tunnel syndrome, bilateral   . Diabetes mellitus   . Gastritis with bleeding due to alcohol 2010   [facility] admission  . GERD (gastroesophageal reflux disease)   . Hyperlipidemia   . Hypertension    no meds  . Menopause   . Neck pain   . Wears glasses     Tobacco Use: Social History   Tobacco Use  Smoking Status Former Smoker  . Quit date: 05/09/2012  . Years since quitting: 7.5  Smokeless Tobacco Never Used    Labs: Recent Review Flowsheet Data    Labs for  ITP Cardiac and Pulmonary Rehab Latest Ref Rng & Units 07/25/2018 11/25/2018 12/21/2018 08/31/2019 10/09/2019   Cholestrol 0 - 200 mg/dL - - 238(H) - 172   LDLCALC 0 - 99 mg/dL - - 150(H) - 107(H)   LDLDIRECT mg/dL - - - - -   HDL >40 mg/dL - - 69 - 39(L)   Trlycerides <150 mg/dL - - 87 - 132   Hemoglobin A1c 4.0 - 5.6 % 6.8(A) 7.8(A) - 7.6(A) -      Capillary Blood Glucose: Lab Results  Component Value Date   GLUCAP 280 (H) 12/05/2019   GLUCAP 232 (H) 10/10/2019   GLUCAP 290 (H) 10/10/2019   GLUCAP 296 (H) 10/09/2019   GLUCAP 242 (H) 10/09/2019     Exercise Target Goals: Exercise Program Goal: Individual exercise prescription set using results from initial 6 min walk test and THRR while considering  patient's activity barriers and safety.   Exercise Prescription Goal: Initial exercise prescription builds to 30-45 minutes a day of aerobic activity, 2-3 days per week.  Home exercise guidelines will be given to patient during program as part of exercise prescription that the participant will acknowledge.  Activity Barriers & Risk Stratification: Activity Barriers & Cardiac Risk Stratification - 12/05/19 1440      Activity Barriers & Cardiac Risk Stratification   Activity Barriers  Other (comment);Assistive Device    Comments  Patient states that she has chronic neck, shoulder, left knee, and bilateral ankle pain.    Cardiac Risk Stratification  High       6 Minute Walk: 6 Minute Walk    Row Name 12/05/19 1513         6 Minute Walk   Phase  Initial     Distance  496 feet     Walk Time  5.45 minutes     # of Rest Breaks  1     MPH  1.03     METS  1.61     RPE  13     Perceived Dyspnea   0     VO2 Peak  5.65     Symptoms  Yes (comment)     Comments  Patient c/o neck, shoulder, and right ankle pain, which is chronic for her. Patient rated her pain a 10/10 on the pain scale, and 1 seted rest break taken for 33 seconds.     Resting HR  88 bpm     Resting BP  108/78      Resting Oxygen Saturation   99 %     Exercise Oxygen Saturation  during 6 min walk  100 %     Max Ex. HR  96 bpm     Max Ex.  BP  120/72     2 Minute Post BP  122/68        Oxygen Initial Assessment:   Oxygen Re-Evaluation:   Oxygen Discharge (Final Oxygen Re-Evaluation):   Initial Exercise Prescription: Initial Exercise Prescription - 12/06/19 0800      Date of Initial Exercise RX and Referring Provider   Date  12/05/19    Referring Provider  Adrian Prows, MD      Track   Minutes  30      Prescription Details   Frequency (times per week)  4-5    Duration  Progress to 30 minutes of continuous aerobic without signs/symptoms of physical distress      Intensity   THRR 40-80% of Max Heartrate  64-129    Ratings of Perceived Exertion  11-13    Perceived Dyspnea  0-4      Progression   Progression  Continue to progress workloads to maintain intensity without signs/symptoms of physical distress.      Resistance Training   Training Prescription  Yes    Reps  10-15       Perform Capillary Blood Glucose checks as needed.  Exercise Prescription Changes:   Exercise Comments:  Exercise Comments    Row Name 12/05/19 1440           Exercise Comments  Reviewed home exercise plan with patient. Patient plans to use the virtual cardiac rehab app to log her exercise.          Exercise Goals and Review:  Exercise Goals    Row Name 12/05/19 1440             Exercise Goals   Increase Physical Activity  Yes       Intervention  Provide advice, education, support and counseling about physical activity/exercise needs.;Develop an individualized exercise prescription for aerobic and resistive training based on initial evaluation findings, risk stratification, comorbidities and participant's personal goals.       Expected Outcomes  Short Term: Attend rehab on a regular basis to increase amount of physical activity.;Long Term: Exercising regularly at least 3-5 days a  week.;Long Term: Add in home exercise to make exercise part of routine and to increase amount of physical activity.       Increase Strength and Stamina  Yes       Intervention  Provide advice, education, support and counseling about physical activity/exercise needs.;Develop an individualized exercise prescription for aerobic and resistive training based on initial evaluation findings, risk stratification, comorbidities and participant's personal goals.       Expected Outcomes  Short Term: Increase workloads from initial exercise prescription for resistance, speed, and METs.;Short Term: Perform resistance training exercises routinely during rehab and add in resistance training at home;Long Term: Improve cardiorespiratory fitness, muscular endurance and strength as measured by increased METs and functional capacity (6MWT)       Able to understand and use rate of perceived exertion (RPE) scale  Yes       Intervention  Provide education and explanation on how to use RPE scale       Expected Outcomes  Short Term: Able to use RPE daily in rehab to express subjective intensity level;Long Term:  Able to use RPE to guide intensity level when exercising independently       Knowledge and understanding of Target Heart Rate Range (THRR)  Yes       Intervention  Provide education and explanation of THRR including how the numbers were predicted and  where they are located for reference       Expected Outcomes  Short Term: Able to state/look up THRR;Long Term: Able to use THRR to govern intensity when exercising independently;Short Term: Able to use daily as guideline for intensity in rehab       Able to check pulse independently  Yes       Intervention  Provide education and demonstration on how to check pulse in carotid and radial arteries.;Review the importance of being able to check your own pulse for safety during independent exercise       Expected Outcomes  Long Term: Able to check pulse independently and  accurately;Short Term: Able to explain why pulse checking is important during independent exercise       Understanding of Exercise Prescription  Yes       Intervention  Provide education, explanation, and written materials on patient's individual exercise prescription       Expected Outcomes  Short Term: Able to explain program exercise prescription;Long Term: Able to explain home exercise prescription to exercise independently          Exercise Goals Re-Evaluation :   Discharge Exercise Prescription (Final Exercise Prescription Changes):   Nutrition:  Target Goals: Understanding of nutrition guidelines, daily intake of sodium 1500mg , cholesterol 200mg , calories 30% from fat and 7% or less from saturated fats, daily to have 5 or more servings of fruits and vegetables.  Biometrics: Pre Biometrics - 12/05/19 1445      Pre Biometrics   Height  5' 4.25" (1.632 m)    Weight  89.1 kg    Waist Circumference  44 inches    Hip Circumference  48.75 inches    Waist to Hip Ratio  0.9 %    BMI (Calculated)  33.45    Triceps Skinfold  13 mm    % Body Fat  40.5 %    Grip Strength  13 kg    Flexibility  --   Not attempted due to neck and shoulder pain.   Single Leg Stand  7.87 seconds        Nutrition Therapy Plan and Nutrition Goals:   Nutrition Assessments:   Nutrition Goals Re-Evaluation:   Nutrition Goals Re-Evaluation:   Nutrition Goals Discharge (Final Nutrition Goals Re-Evaluation):   Psychosocial: Target Goals: Acknowledge presence or absence of significant depression and/or stress, maximize coping skills, provide positive support system. Participant is able to verbalize types and ability to use techniques and skills needed for reducing stress and depression.  Initial Review & Psychosocial Screening: Initial Psych Review & Screening - 12/05/19 1402      Initial Review   Current issues with  None Identified      Family Dynamics   Good Support System?  Yes     Comments  Ms. Harrell has a positive attitude and outlook related to her cardiovascular health. She is discouraged that she continues to have right shoulder/arm pain that is preventing her from returning to work. She is in the process of completing disability papers. She admits to having a strong support system. No psychosocial interventions needed at this time.      Barriers   Psychosocial barriers to participate in program  There are no identifiable barriers or psychosocial needs.      Screening Interventions   Interventions  Encouraged to exercise       Quality of Life Scores: Quality of Life - 12/05/19 1600      Quality of Life   Select  Quality of Life      Quality of Life Scores   Health/Function Pre  23.82 %    Socioeconomic Pre  23.36 %    Psych/Spiritual Pre  26.57 %    Family Pre  25.5 %    GLOBAL Pre  24.53 %      Scores of 19 and below usually indicate a poorer quality of life in these areas.  A difference of  2-3 points is a clinically meaningful difference.  A difference of 2-3 points in the total score of the Quality of Life Index has been associated with significant improvement in overall quality of life, self-image, physical symptoms, and general health in studies assessing change in quality of life.  PHQ-9: Recent Review Flowsheet Data    Depression screen [facility] 2/9 08/10/2019 08/16/2017 05/04/2017 06/19/2016 11/15/2012   Decreased Interest 0 0 0 0 0   Down, Depressed, Hopeless 0 0 0 0 0   PHQ - 2 Score 0 0 0 0 0     Interpretation of Total Score  Total Score Depression Severity:  1-4 = Minimal depression, 5-9 = Mild depression, 10-14 = Moderate depression, 15-19 = Moderately severe depression, 20-27 = Severe depression   Psychosocial Evaluation and Intervention:   Psychosocial Re-Evaluation:   Psychosocial Discharge (Final Psychosocial Re-Evaluation):   Vocational Rehabilitation: Provide vocational rehab assistance to qualifying candidates.   Vocational  Rehab Evaluation & Intervention: Vocational Rehab - 12/05/19 1408      Initial Vocational Rehab Evaluation & Intervention   Assessment shows need for Vocational Rehabilitation  No      Vocational Rehab Re-Evaulation   Comments  patient currently persuing disability       Education: Education Goals: Education classes will be provided on a weekly basis, covering required topics. Participant will state understanding/return demonstration of topics presented.  Learning Barriers/Preferences:   Education Topics: Count Your Pulse:  -Group instruction provided by verbal instruction, demonstration, patient participation and written materials to support subject.  Instructors address importance of being able to find your pulse and how to count your pulse when at home without a heart monitor.  Patients get hands on experience counting their pulse with staff help and individually.   Heart Attack, Angina, and Risk Factor Modification:  -Group instruction provided by verbal instruction, video, and written materials to support subject.  Instructors address signs and symptoms of angina and heart attacks.    Also discuss risk factors for heart disease and how to make changes to improve heart health risk factors.   Functional Fitness:  -Group instruction provided by verbal instruction, demonstration, patient participation, and written materials to support subject.  Instructors address safety measures for doing things around the house.  Discuss how to get up and down off the floor, how to pick things up properly, how to safely get out of a chair without assistance, and balance training.   Meditation and Mindfulness:  -Group instruction provided by verbal instruction, patient participation, and written materials to support subject.  Instructor addresses importance of mindfulness and meditation practice to help reduce stress and improve awareness.  Instructor also leads participants through a meditation  exercise.    Stretching for Flexibility and Mobility:  -Group instruction provided by verbal instruction, patient participation, and written materials to support subject.  Instructors lead participants through series of stretches that are designed to increase flexibility thus improving mobility.  These stretches are additional exercise for major muscle groups that are typically performed during regular warm up and cool  down.   Hands Only CPR:  -Group verbal, video, and participation provides a basic overview of AHA guidelines for community CPR. Role-play of emergencies allow participants the opportunity to practice calling for help and chest compression technique with discussion of AED use.   Hypertension: -Group verbal and written instruction that provides a basic overview of hypertension including the most recent diagnostic guidelines, risk factor reduction with self-care instructions and medication management.    Nutrition I class: Heart Healthy Eating:  -Group instruction provided by PowerPoint slides, verbal discussion, and written materials to support subject matter. The instructor gives an explanation and review of the Therapeutic Lifestyle Changes diet recommendations, which includes a discussion on lipid goals, dietary fat, sodium, fiber, plant stanol/sterol esters, sugar, and the components of a well-balanced, healthy diet.   Nutrition II class: Lifestyle Skills:  -Group instruction provided by PowerPoint slides, verbal discussion, and written materials to support subject matter. The instructor gives an explanation and review of label reading, grocery shopping for heart health, heart healthy recipe modifications, and ways to make healthier choices when eating out.   Diabetes Question & Answer:  -Group instruction provided by PowerPoint slides, verbal discussion, and written materials to support subject matter. The instructor gives an explanation and review of diabetes  co-morbidities, pre- and post-prandial blood glucose goals, pre-exercise blood glucose goals, signs, symptoms, and treatment of hypoglycemia and hyperglycemia, and foot care basics.   Diabetes Blitz:  -Group instruction provided by PowerPoint slides, verbal discussion, and written materials to support subject matter. The instructor gives an explanation and review of the physiology behind type 1 and type 2 diabetes, diabetes medications and rational behind using different medications, pre- and post-prandial blood glucose recommendations and Hemoglobin A1c goals, diabetes diet, and exercise including blood glucose guidelines for exercising safely.    Portion Distortion:  -Group instruction provided by PowerPoint slides, verbal discussion, written materials, and food models to support subject matter. The instructor gives an explanation of serving size versus portion size, changes in portions sizes over the last 20 years, and what consists of a serving from each food group.   Stress Management:  -Group instruction provided by verbal instruction, video, and written materials to support subject matter.  Instructors review role of stress in heart disease and how to cope with stress positively.     Exercising on Your Own:  -Group instruction provided by verbal instruction, power point, and written materials to support subject.  Instructors discuss benefits of exercise, components of exercise, frequency and intensity of exercise, and end points for exercise.  Also discuss use of nitroglycerin and activating EMS.  Review options of places to exercise outside of rehab.  Review guidelines for sex with heart disease.   Cardiac Drugs I:  -Group instruction provided by verbal instruction and written materials to support subject.  Instructor reviews cardiac drug classes: antiplatelets, anticoagulants, beta blockers, and statins.  Instructor discusses reasons, side effects, and lifestyle considerations for each  drug class.   Cardiac Drugs II:  -Group instruction provided by verbal instruction and written materials to support subject.  Instructor reviews cardiac drug classes: angiotensin converting enzyme inhibitors (ACE-I), angiotensin II receptor blockers (ARBs), nitrates, and calcium channel blockers.  Instructor discusses reasons, side effects, and lifestyle considerations for each drug class.   Anatomy and Physiology of the Circulatory System:  Group verbal and written instruction and models provide basic cardiac anatomy and physiology, with the coronary electrical and arterial systems. Review of: AMI, Angina, Valve disease, Heart Failure, Peripheral Artery  Disease, Cardiac Arrhythmia, Pacemakers, and the ICD.   Other Education:  -Group or individual verbal, written, or video instructions that support the educational goals of the cardiac rehab program.   Holiday Eating Survival Tips:  -Group instruction provided by PowerPoint slides, verbal discussion, and written materials to support subject matter. The instructor gives patients tips, tricks, and techniques to help them not only survive but enjoy the holidays despite the onslaught of food that accompanies the holidays.   Knowledge Questionnaire Score: Knowledge Questionnaire Score - 12/05/19 1600      Knowledge Questionnaire Score   Pre Score  21/24       Core Components/Risk Factors/Patient Goals at Admission: Personal Goals and Risk Factors at Admission - 12/05/19 1440      Core Components/Risk Factors/Patient Goals on Admission    Weight Management  Yes;Obesity    Intervention  Weight Management/Obesity: Establish reasonable short term and long term weight goals.;Obesity: Provide education and appropriate resources to help participant work on and attain dietary goals.    Admit Weight  196 lb 6.9 oz (89.1 kg)    Expected Outcomes  Short Term: Continue to assess and modify interventions until short term weight is achieved;Weight  Loss: Understanding of general recommendations for a balanced deficit meal plan, which promotes 1-2 lb weight loss per week and includes a negative energy balance of [phone] kcal/d;Long Term: Adherence to nutrition and physical activity/exercise program aimed toward attainment of established weight goal    Diabetes  Yes    Intervention  Provide education about signs/symptoms and action to take for hypo/hyperglycemia.;Provide education about proper nutrition, including hydration, and aerobic/resistive exercise prescription along with prescribed medications to achieve blood glucose in normal ranges: Fasting glucose 65-99 mg/dL    Expected Outcomes  Short Term: Participant verbalizes understanding of the signs/symptoms and immediate care of hyper/hypoglycemia, proper foot care and importance of medication, aerobic/resistive exercise and nutrition plan for blood glucose control.;Long Term: Attainment of HbA1C < 7%.    Hypertension  Yes    Intervention  Provide education on lifestyle modifcations including regular physical activity/exercise, weight management, moderate sodium restriction and increased consumption of fresh fruit, vegetables, and low fat dairy, alcohol moderation, and smoking cessation.;Monitor prescription use compliance.    Expected Outcomes  Short Term: Continued assessment and intervention until BP is < 140/42mm HG in hypertensive participants. < 130/16mm HG in hypertensive participants with diabetes, heart failure or chronic kidney disease.;Long Term: Maintenance of blood pressure at goal levels.    Lipids  Yes    Intervention  Provide education and support for participant on nutrition & aerobic/resistive exercise along with prescribed medications to achieve LDL 70mg , HDL >40mg .    Expected Outcomes  Short Term: Participant states understanding of desired cholesterol values and is compliant with medications prescribed. Participant is following exercise prescription and nutrition  guidelines.;Long Term: Cholesterol controlled with medications as prescribed, with individualized exercise RX and with personalized nutrition plan. Value goals: LDL < 70mg , HDL > 40 mg.       Core Components/Risk Factors/Patient Goals Review:    Core Components/Risk Factors/Patient Goals at Discharge (Final Review):    ITP Comments: ITP Comments    Row Name 12/05/19 1501           ITP Comments  Dr. Fransico Him, Medical Director [facility]          Comments: Patient attended orientation on 12/06/2019 to review rules and guidelines for program.  Completed 6 minute walk test, Intitial ITP, and  exercise prescription. Patient consented to Virtual Cardiac Rehab through the [employer] App. Will assist with set up and usage. VSS. Telemetry-NSR.  Asymptomatic. Safety measures and social distancing in place per CDC guidelines.

## 2019-12-05 NOTE — Research (Signed)
unscheduled visit for her pregnancy test it was negative

## 2019-12-05 NOTE — Progress Notes (Signed)
         Confirm Consent - In the setting of the current Covid19 crisis, you are scheduled for a phone visit with your Cardiac or Pulmonary team member.  Just as we do with many in-gym visits, in order for you to participate in this visit, we must obtain consent.  If you'd like, I can send this to your mychart (if signed up) or email for you to review.  Otherwise, I can obtain your verbal consent now.  By agreeing to a telephone visit, we'd like you to understand that the technology does not allow for your Cardiac or Pulmonary Rehab team member to perform a physical assessment, and thus may limit their ability to fully assess your ability to perform exercise programs. If your provider identifies any concerns that need to be evaluated in person, we will make arrangements to do so.  Finally, though the technology is pretty good, we cannot assure that it will always work on either your or our end and we cannot ensure that we have a secure connection.  Cardiac and Pulmonary Rehab Telehealth visits and "At Home" cardiac and pulmonary rehab are provided at no cost to you.        Are you willing to proceed?"        STAFF: Did the patient verbally acknowledge consent to telehealth visit? Document YES/NO here: Yes     [name], BSN   Cardiac and Pulmonary Rehab Staff        Date 12/05/19 @ Time 1430

## 2019-12-05 NOTE — Progress Notes (Signed)
Spoke to pt regarding Virtual Cardiac  and Pulmonary Rehab.  Pt  was able to download the [facility] app on their smart device with no issues. Pt set up their account and received the following welcome message -"Welcome to the [place] and Pulmonary Rehabilitation program. We hope that you will find the exercise program beneficial in your recovery process. Our staff is available to assist with any questions/concerns about your exercise routine. Best wishes". Brief orientation provided to with the advisement to watch the "Intro to Rehab" series located under the Resource tab. Pt verbalized understanding. Will continue to follow and monitor pt progress with feedback as needed. Meilech Virts E. Laray Anger, BSN

## 2019-12-06 NOTE — Telephone Encounter (Signed)
See most recent telephone encounter from Dr. Derrel Nip.

## 2019-12-06 NOTE — Telephone Encounter (Signed)
Please let patient know that per the note attached to the form she dropped off,  the specialist is only allowing her to remain out of work until Jan 11, so that is the date I am using on her disability forms   We will need to change the dates on the forms I filled out

## 2019-12-06 NOTE — Progress Notes (Signed)
Reviewed home exercise guidelines with patient including endpoints, temperature precautions, target heart rate and rate of perceived exertion. Pt plans to walk as her mode of home exercise. Patient states she's been walking some outside and up and down the halls in her home, but is limited by chronic pain (shoulders, neck, left knee, ankles) and balance concerns. Patient uses a can with walking. Discussed walking shorter bouts (10 minutes 3x's/day or 15 minutes 2x's/day), and pt is amenable to this. Pt attends physical therapy for her shoulders and neck and will discuss balance concerns with her physical therapist at her next visit. Pt voices understanding of instructions given.  Sol Passer, MS, ACSM CEP

## 2019-12-07 ENCOUNTER — Other Ambulatory Visit: Payer: Self-pay

## 2019-12-07 ENCOUNTER — Inpatient Hospital Stay (HOSPITAL_COMMUNITY)
Admission: RE | Admit: 2019-12-07 | Discharge: 2019-12-07 | Disposition: A | Discharge status: Home / Self Care | Payer: BC Managed Care – PPO | Source: Ambulatory Visit

## 2019-12-07 NOTE — Telephone Encounter (Signed)
Spoke with pt to let her know that attached to the form she dropped off the other day was a work note from [place], NP stating that pt could return to work on 12/11/2019. Pt stated that she is still in a lot of pain with her shoulder and doesn't feel that she can perform her job yet.

## 2019-12-07 NOTE — Telephone Encounter (Signed)
The most I can offer is the end of January .  I will not authorize longer than that .  She will need to return to the neurosurgeon and orthopedist this  to figure out why her shoulder is still in so much pain , and if they say there is nothing else that can be done she will have to apply for disability through the Social Security administration ,  I do NOT DO LONG TERM DISABILITY

## 2019-12-07 NOTE — Telephone Encounter (Signed)
Pt came into the office to pick up forms. Pt was advised of this message by Juliann Pulse, RN. Forms have been given back to Dr. Derrel Nip for completion.

## 2019-12-07 NOTE — Progress Notes (Signed)
[name] 59 y.o. female Nutrition Note - Virtual Cardiac Rehab Initial Assessment  Spoke with pt in person. Pt did not return surveys at this time. Pt wants to lose wt and control diabetes. She feels she could improve her diet  Pt has type 2 diabetes. Last A1c indicates blood glucose is not well-controlled. Pt checks CBG's 2 times a day. Fasting CBG's reportedly 120-190 mg/dL. Evening CBG's 200-300 mg/dl. Reviewed ways to manage blood sugars including consistent carb intake and taking medications as scheduled. Pt feels she does not have a routine with her habits. Strongly recommended that pt eats and takes meds by 8 am, 1 pm, and 7 pm. Pt amenable to this and to keeping and food and glucose log this week.  Per discussion, pt uses canned/convenience foods often but tries to rinse her canned foods to decrease sodium. Pt not does add salt to food. Pt does not eat out frequently.  Pt's daughter and grandson live with her and they share the cooking. She has been in a lot of neck pain which causes her to sit more than she used to. She attributes this to weight gain and poor blood sugar control in the past year.   Pt expressed understanding of the information reviewed. Pt agrees to speak with dietitian weekly on the phone for nutrition counseling during virtual cardiac rehab program.   Lab Results  Component Value Date   HGBA1C 7.6 (A) 08/31/2019    Wt Readings from Last 3 Encounters:  12/05/19 196 lb 6.9 oz (89.1 kg)  11/29/19 194 lb 12.8 oz (88.4 kg)  11/07/19 194 lb 12.8 oz (88.4 kg)    Nutrition Diagnosis ? Food-and nutrition-related knowledge deficit related to lack of exposure to information as related to diagnosis of: ? CVD ? Type 2 diabetes.  Nutrition Intervention ? Pt's individual nutrition plan reviewed with pt. ? Necessity of managing blood glucose and practical ways to do this ? Continue client-centered nutrition education by RD, as part of interdisciplinary  care.  Goal(s) ? CBG concentrations in the normal range or as close to normal as is safely possible. ? Improved blood glucose control as evidenced by pt's A1c trending from 7.6 toward less than 7.0. ? Pt able to name foods that affect blood glucose  Plan:    Will provide client-centered nutrition education as part of interdisciplinary care  Monitor and evaluate progress toward nutrition goal with team.   [name], MS, RDN, LDN

## 2019-12-08 ENCOUNTER — Other Ambulatory Visit: Payer: Self-pay | Admitting: Internal Medicine

## 2019-12-08 DIAGNOSIS — Z0279 Encounter for issue of other medical certificate: Secondary | ICD-10-CM

## 2019-12-11 ENCOUNTER — Ambulatory Visit (HOSPITAL_COMMUNITY): Payer: BC Managed Care – PPO

## 2019-12-11 LAB — CMP14+EGFR
ALT: 22 IU/L (ref 0–32)
AST: 15 IU/L (ref 0–40)
Albumin/Globulin Ratio: 1.7 (ref 1.2–2.2)
Albumin: 4.5 g/dL (ref 3.8–4.9)
Alkaline Phosphatase: 125 IU/L — ABNORMAL HIGH (ref 39–117)
BUN/Creatinine Ratio: 20 (ref 9–23)
BUN: 15 mg/dL (ref 6–24)
Bilirubin Total: 0.3 mg/dL (ref 0.0–1.2)
CO2: 24 mmol/L (ref 20–29)
Calcium: 9.8 mg/dL (ref 8.7–10.2)
Chloride: 104 mmol/L (ref 96–106)
Creatinine, Ser: 0.74 mg/dL (ref 0.57–1.00)
GFR calc Af Amer: 103 mL/min/{1.73_m2} (ref >59)
GFR calc non Af Amer: 89 mL/min/{1.73_m2} (ref >59)
Globulin, Total: 2.7 g/dL (ref 1.5–4.5)
Glucose: 198 mg/dL — ABNORMAL HIGH (ref 65–99)
Potassium: 4.4 mmol/L (ref 3.5–5.2)
Sodium: 141 mmol/L (ref 134–144)
Total Protein: 7.2 g/dL (ref 6.0–8.5)

## 2019-12-11 LAB — LIPID PANEL WITH LDL/HDL RATIO
Cholesterol, Total: 147 mg/dL (ref 100–199)
HDL: 45 mg/dL (ref >39)
LDL Chol Calc (NIH): 81 mg/dL (ref 0–99)
LDL/HDL Ratio: 1.8 ratio (ref 0.0–3.2)
Triglycerides: 114 mg/dL (ref 0–149)
VLDL Cholesterol Cal: 21 mg/dL (ref 5–40)

## 2019-12-11 LAB — LIPOPROTEIN A (LPA): Lipoprotein (a): 127.7 nmol/L — ABNORMAL HIGH (ref <75.0)

## 2019-12-11 LAB — CBC
Hematocrit: 38.3 % (ref 34.0–46.6)
Hemoglobin: 12.6 g/dL (ref 11.1–15.9)
MCH: 29.9 pg (ref 26.6–33.0)
MCHC: 32.9 g/dL (ref 31.5–35.7)
MCV: 91 fL (ref 79–97)
Platelets: 379 10*3/uL (ref 150–450)
RBC: 4.22 x10E6/uL (ref 3.77–5.28)
RDW: 12.6 % (ref 11.7–15.4)
WBC: 9.8 10*3/uL (ref 3.4–10.8)

## 2019-12-12 DIAGNOSIS — Z0279 Encounter for issue of other medical certificate: Secondary | ICD-10-CM

## 2019-12-12 IMAGING — CR DG CHEST 2V
2 series · 2 of 2 positions shown · non-contrast
Comparison: 12/31/2017

CLINICAL DATA: Chest pain

EXAM:
CHEST - 2 VIEW

[chest pa]
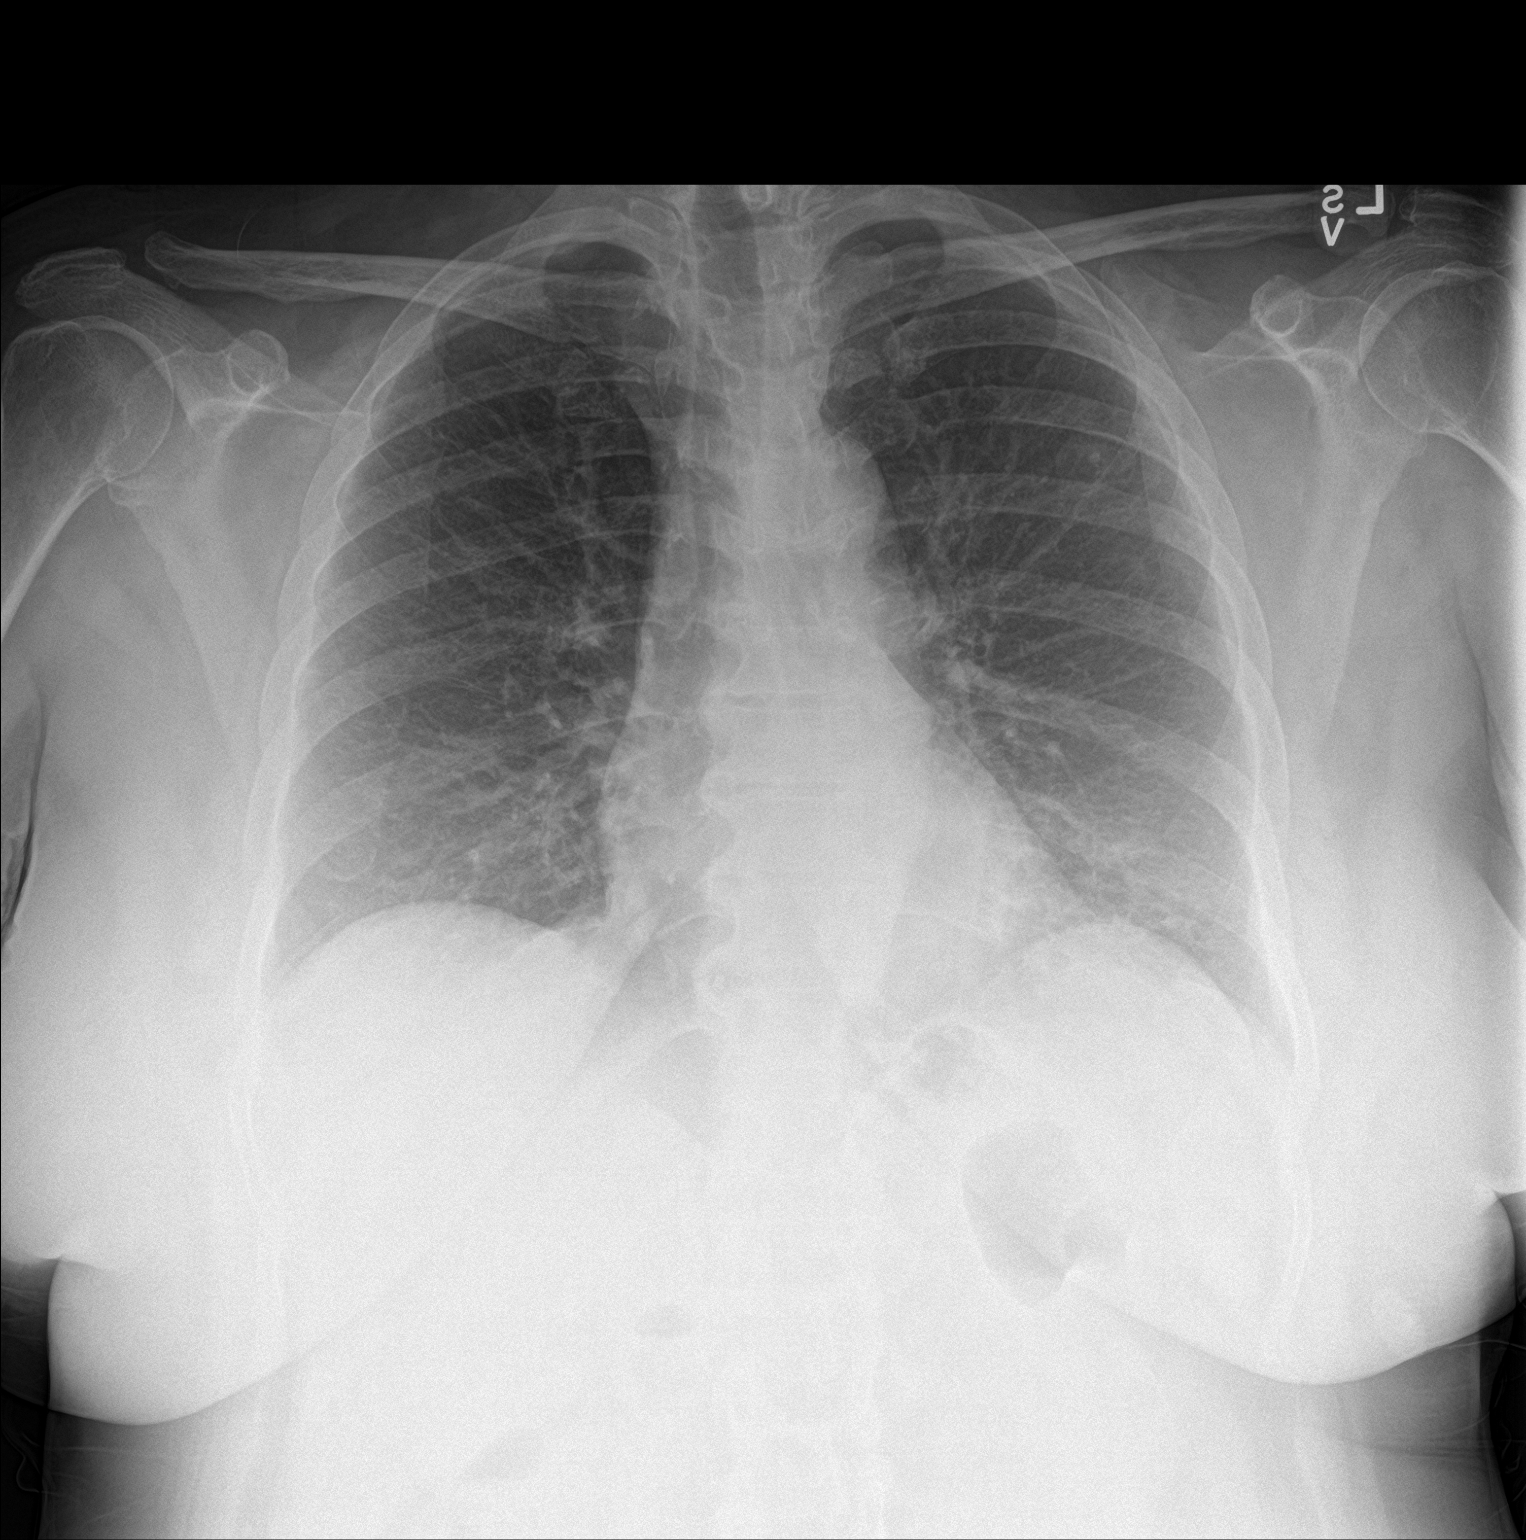

[chest lat]
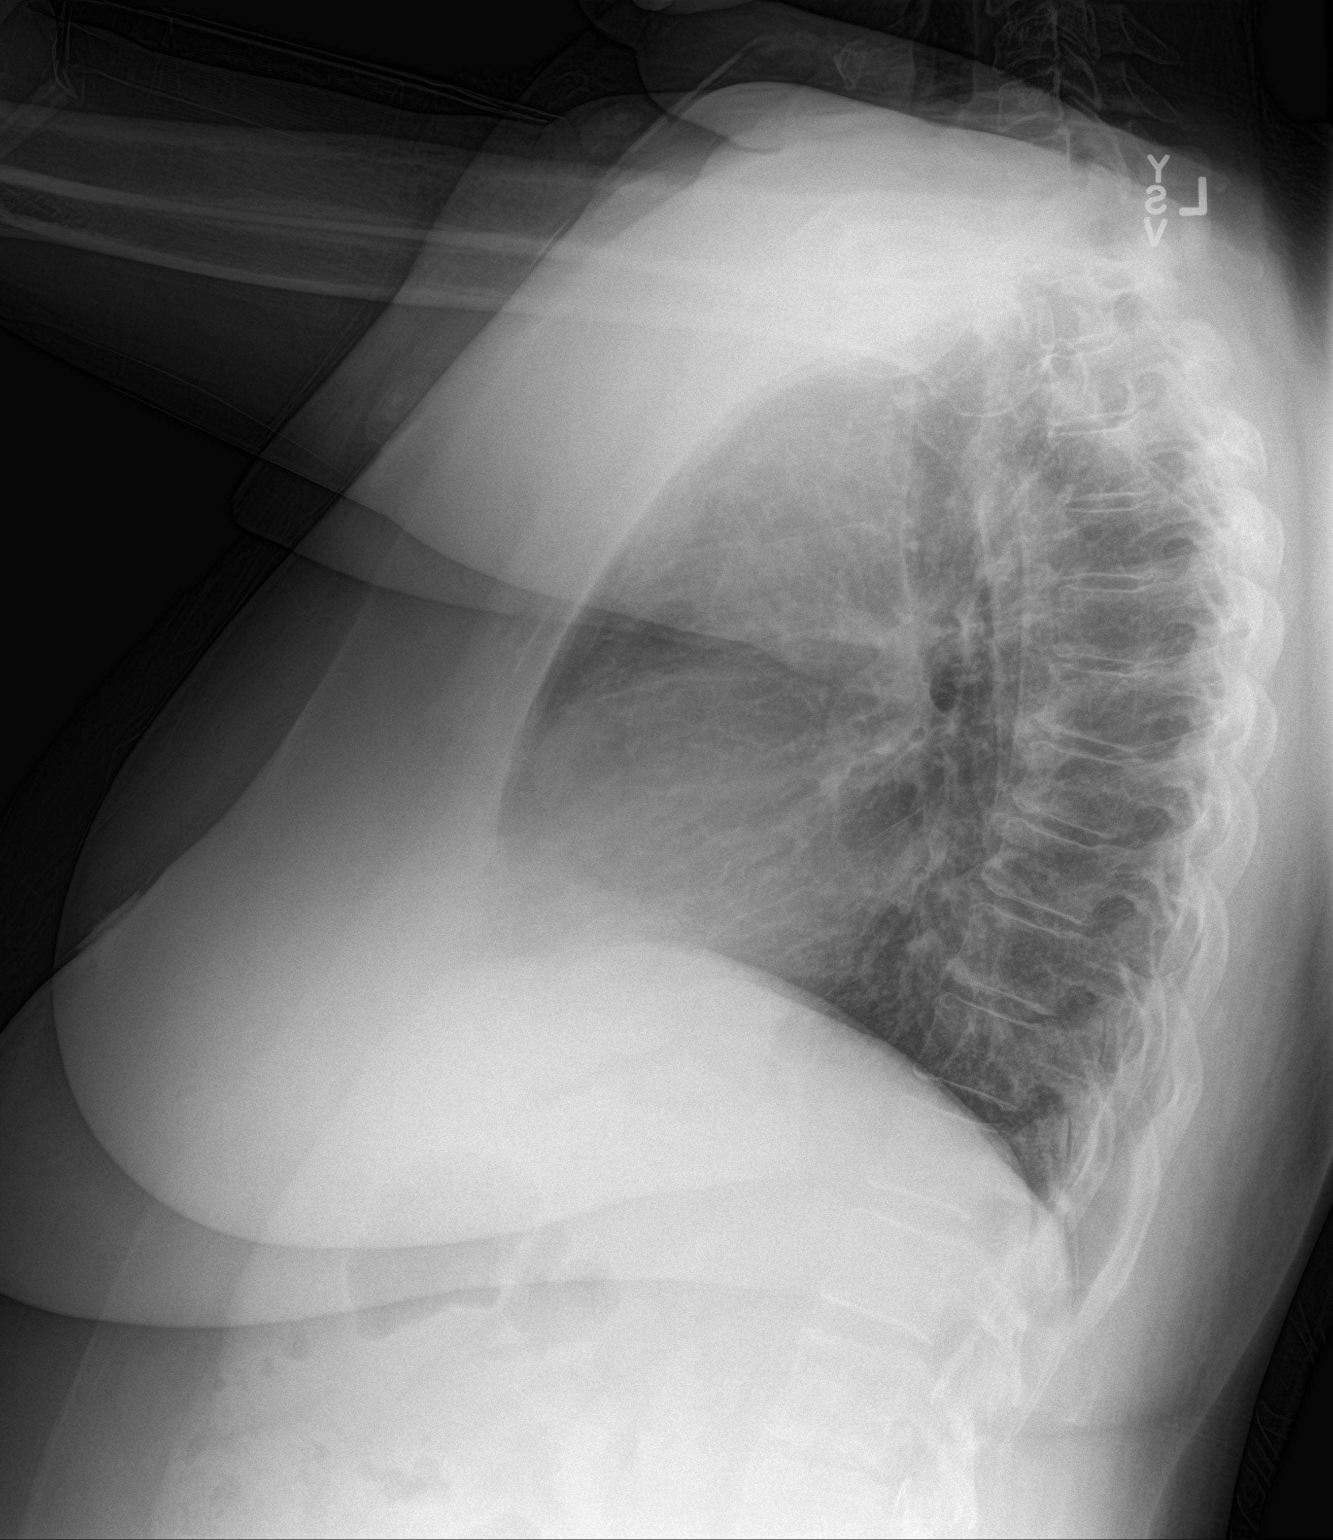

[2 of 2 positions shown; findings below may reference images not displayed]

FINDINGS: The heart size and mediastinal contours are within normal limits.
Both lungs are clear. The visualized skeletal structures are
unremarkable.
IMPRESSION: No active cardiopulmonary disease.

## 2019-12-12 NOTE — Telephone Encounter (Signed)
List of all appts for the ones listed below have been typed up and printed. When Dr. Derrel Nip comes in the office today I will have her sign the last form.

## 2019-12-13 ENCOUNTER — Ambulatory Visit (HOSPITAL_COMMUNITY): Payer: BC Managed Care – PPO

## 2019-12-14 ENCOUNTER — Telehealth: Payer: Self-pay | Admitting: Internal Medicine

## 2019-12-14 ENCOUNTER — Other Ambulatory Visit: Payer: Self-pay

## 2019-12-14 ENCOUNTER — Encounter (HOSPITAL_COMMUNITY)
Admission: RE | Admit: 2019-12-14 | Discharge: 2019-12-14 | Disposition: A | Discharge status: Home / Self Care | Payer: Self-pay | Source: Ambulatory Visit | Attending: Cardiology | Admitting: Cardiology

## 2019-12-14 MED ORDER — ONDANSETRON 4 MG PO TBDP: 4.0000 mg | ORAL_TABLET | Freq: Three times a day (TID) | ORAL | 0 refills | Status: DC | PRN

## 2019-12-14 NOTE — Telephone Encounter (Signed)
zofran orally dissolving tablet (ODT) sent to pharmacy

## 2019-12-14 NOTE — Telephone Encounter (Signed)
Pt wanted to know if she could have something called in for Nausea

## 2019-12-14 NOTE — Progress Notes (Signed)
Virtual Cardiac Rehab Note:  Incoming call from [name] to discuss her inability to download the [facility] Cardiac Rehab App. [name] has decided that she would like to have weekly telephone follow up to discuss her cardiac rehab plan of care and exercise sessions. [name] also informs CR RN she has had a sudden onset of N/V/D. She was also hypoglycemic this morning reporting a fasting CBG of 76. [name] states she treated with mariajuana, OJ, and cereal. She reports her blood sugar is now 135, however she still does not feel well. She denies fever or chills. She has not checked her vitals. She has not had any episodes of V/D since last evening. She has not attempted to exercise since she presented to CR orientation secondary to her ongoing right should and arm pain.   [name] is strongly encouraged to call her PCP for appointment secondary to GI symptoms. She is also educated on s/s of hypoglycemia and treatment protocol. [name] understanding.  Plan: CR RN will follow up with patient tomorrow via telephone for further symptom assessment.  [name] E. Laray Anger, BSN

## 2019-12-14 NOTE — Telephone Encounter (Signed)
Spoke with pt to let her know that Dr. Derrel Nip has sent in zofran for her to take for nausea. Pt gave a verbal understanding.

## 2019-12-15 ENCOUNTER — Encounter (HOSPITAL_COMMUNITY)
Admission: RE | Admit: 2019-12-15 | Discharge: 2019-12-15 | Disposition: A | Discharge status: Home / Self Care | Payer: Self-pay | Source: Ambulatory Visit | Attending: Cardiology | Admitting: Cardiology

## 2019-12-15 ENCOUNTER — Ambulatory Visit (HOSPITAL_COMMUNITY): Payer: BC Managed Care – PPO

## 2019-12-15 ENCOUNTER — Other Ambulatory Visit: Payer: Self-pay

## 2019-12-15 DIAGNOSIS — Z955 Presence of coronary angioplasty implant and graft: Secondary | ICD-10-CM | POA: Insufficient documentation

## 2019-12-15 DIAGNOSIS — I214 Non-ST elevation (NSTEMI) myocardial infarction: Secondary | ICD-10-CM | POA: Insufficient documentation

## 2019-12-15 NOTE — Progress Notes (Signed)
Cardiac Rehab Note: Successful telephone encounter to [name] to follow up on yesterdays complaint of N/V. Per [name] and EMR documentation, she did contact her PCP and Zofran was prescribed. [name] states she feels much better today and the medications has resolved her nausea. She has not exercised today. She is encouraged to resume exercise gradually.  [name] does report a fasting blood sugars of 53 this morning. She treated with orange juice and an apple. She later ate cereal and reports her blood sugar "came up to 100 and something". She has met with [name] RD and been provided with verbal and written information on diabetic diet and healthy eating. Per [name], she had a sub, salad, apple and kiwi for dinner. She took her DM medications as prescribed. [name] was encouraged to review her written education materials provided by RD to ensure proper balance of carbs and protein at each meal which will hopefully prevent early am hypoglycemic events.  Plan: Will forward note to RD. Follow up within 1 week  [name] E. Laray Anger, BSN

## 2019-12-18 ENCOUNTER — Ambulatory Visit (HOSPITAL_COMMUNITY): Payer: BC Managed Care – PPO

## 2019-12-19 ENCOUNTER — Inpatient Hospital Stay (HOSPITAL_COMMUNITY)
Admission: RE | Admit: 2019-12-19 | Discharge: 2019-12-19 | Disposition: A | Discharge status: Home / Self Care | Payer: BC Managed Care – PPO | Source: Ambulatory Visit

## 2019-12-19 ENCOUNTER — Other Ambulatory Visit: Payer: Self-pay

## 2019-12-19 NOTE — Progress Notes (Signed)
Cardiac Rehab - Virtual Nutrition Note  Spoke with pt on the phone after reporting a hypoglycemic episode to RN last week. While on the phone, [name] reported another hypogylcemic episode with CBGs in 30's at 1:30 am today. Pt was able to drink 6.75 oz orange juice and an apple. She went back to sleep and did not check CBG until this morning. Pt did not report morning CBG. She complained of blurry vision and hot flashes on the phone. RD asked [name] to check CBG and it was 90 mg/dl at 3 pm after eating a piece of corn bread 10 minutes prior. She did not take her lunch insulin.   Discussed Modine' insulin regimen. She reported taking morning and lunch insulin correctly but taking a total of 75 units of insulin at night with dinner. Per Dr. Arman Filter note, [name] should be taking 20-25 units of insulin (R) and 25 units of insulin (NPH) with dinner. Reinforced this with pt and had her write it down to remember. Discussed importance of following this protocol and risks of excess insulin. Pt verbalized understanding stating she could "go into a coma in her sleep" if she continues to do this.  Reinforced keeping juice by bedside and checking CBGs after treating a hypoglycemic episode. Requested that [name] start keeping a CBG log as requested by Dr. Cruzita Lederer in October. She has not been doing this but feels she can start today to provide at her next endocrinology appointment in February.   Will follow up with [name] again next week.  [name], MS, RDN, LDN

## 2019-12-20 ENCOUNTER — Ambulatory Visit (HOSPITAL_COMMUNITY): Payer: BC Managed Care – PPO

## 2019-12-22 ENCOUNTER — Ambulatory Visit (HOSPITAL_COMMUNITY): Payer: BC Managed Care – PPO

## 2019-12-25 ENCOUNTER — Ambulatory Visit (HOSPITAL_COMMUNITY): Payer: BC Managed Care – PPO

## 2019-12-25 ENCOUNTER — Telehealth: Payer: Self-pay | Admitting: Internal Medicine

## 2019-12-25 NOTE — Telephone Encounter (Signed)
Pt states that she fell on 11/22/2019 on her left shoulder and is in pain. Pt states that right shoulder is hurting as well. "Throbbing pain"- Pt did not want to make an appt. Just wanting to let Dr Derrel Nip know.

## 2019-12-25 NOTE — Telephone Encounter (Signed)
FYI

## 2019-12-26 ENCOUNTER — Inpatient Hospital Stay (HOSPITAL_COMMUNITY)
Admission: RE | Admit: 2019-12-26 | Discharge: 2019-12-26 | Disposition: A | Discharge status: Home / Self Care | Payer: BC Managed Care – PPO | Source: Ambulatory Visit

## 2019-12-26 ENCOUNTER — Other Ambulatory Visit: Payer: Self-pay

## 2019-12-26 ENCOUNTER — Ambulatory Visit: Payer: BC Managed Care – PPO | Attending: Internal Medicine

## 2019-12-26 DIAGNOSIS — Z20822 Contact with and (suspected) exposure to covid-19: Secondary | ICD-10-CM

## 2019-12-26 NOTE — Progress Notes (Signed)
Virtual Cardiac Rehab: Nutrition Note  Called and spoke with [name] today to discuss her insulin regimen. She has not had any hypoglycemic episodes in past week. Her fasting CBGS have been 79-179 mg/dl. She was able to verbalize the change in her insulin regimen (total of 45 units combined insulin). She feels better now. Experiencing a few hot mild hot flashes seemingly unrelated to CBGs. She is keeping a CBG log. Briefly discussed carb intake. Lively did not have any questions. Will continue to follow up during virtual cardiac rehab.  [name], MS, RDN, LDN

## 2019-12-27 ENCOUNTER — Ambulatory Visit (HOSPITAL_COMMUNITY): Payer: BC Managed Care – PPO

## 2019-12-27 LAB — NOVEL CORONAVIRUS, NAA: SARS-CoV-2, NAA: NOT DETECTED

## 2019-12-28 ENCOUNTER — Telehealth: Payer: Self-pay | Admitting: Internal Medicine

## 2019-12-28 NOTE — Telephone Encounter (Signed)
Patient informed of negative covid result. Patient verbalized understanding.

## 2019-12-29 ENCOUNTER — Telehealth: Payer: Self-pay | Admitting: Internal Medicine

## 2019-12-29 ENCOUNTER — Ambulatory Visit (HOSPITAL_COMMUNITY): Payer: BC Managed Care – PPO

## 2019-12-29 NOTE — Telephone Encounter (Signed)
Pt states her neck and shoulders are still painful and can not go back to work. Pt lives with daughter who tested positive for Covid this week. Pt was advised to get another covid test next week. Please call pt back about returning to work.

## 2019-12-29 NOTE — Telephone Encounter (Signed)
Patient has been exposed by Yolanda Bonine and daughter but patient currently asymptomatic. Will forward to PCP when back in office advised patient nees testing gave number for testing appointment and advised develops any severe symptoms needs to be evaluated. Advised quarantine rules.

## 2020-01-01 ENCOUNTER — Ambulatory Visit (HOSPITAL_COMMUNITY): Payer: BC Managed Care – PPO

## 2020-01-01 NOTE — Telephone Encounter (Signed)
Recommend testing on day 5 or later of exposure  Or after 48 hours of symptoms d

## 2020-01-03 ENCOUNTER — Telehealth: Payer: Self-pay | Admitting: Internal Medicine

## 2020-01-03 ENCOUNTER — Ambulatory Visit (HOSPITAL_COMMUNITY): Payer: BC Managed Care – PPO

## 2020-01-03 NOTE — Telephone Encounter (Signed)
Is it okay to type up this letter for pt?

## 2020-01-03 NOTE — Telephone Encounter (Signed)
I cannot . I have already extended her return to work far past the date that  the specialists who have been treating her neck and shoulder pain have authorized.  She will need to get an extension from them if she feels she needs an extension

## 2020-01-03 NOTE — Telephone Encounter (Signed)
Patient asking for extension on going back to work  for shoulder and neck pain.

## 2020-01-03 NOTE — Telephone Encounter (Signed)
Yes you can type up a return to work letter

## 2020-01-03 NOTE — Telephone Encounter (Signed)
Pt called about needing a letter to go back to work on 01/09/2020. Please advise and thank you!   Call pt @ [phone].

## 2020-01-04 ENCOUNTER — Encounter: Payer: Self-pay | Admitting: *Deleted

## 2020-01-04 ENCOUNTER — Other Ambulatory Visit: Payer: Self-pay

## 2020-01-04 ENCOUNTER — Encounter: Payer: Self-pay | Admitting: Internal Medicine

## 2020-01-04 ENCOUNTER — Ambulatory Visit (INDEPENDENT_AMBULATORY_CARE_PROVIDER_SITE_OTHER): Payer: BC Managed Care – PPO | Admitting: Internal Medicine

## 2020-01-04 DIAGNOSIS — IMO0002 Reserved for concepts with insufficient information to code with codable children: Secondary | ICD-10-CM

## 2020-01-04 DIAGNOSIS — E11319 Type 2 diabetes mellitus with unspecified diabetic retinopathy without macular edema: Secondary | ICD-10-CM

## 2020-01-04 DIAGNOSIS — E1169 Type 2 diabetes mellitus with other specified complication: Secondary | ICD-10-CM | POA: Diagnosis not present

## 2020-01-04 DIAGNOSIS — Z006 Encounter for examination for normal comparison and control in clinical research program: Secondary | ICD-10-CM

## 2020-01-04 DIAGNOSIS — Z794 Long term (current) use of insulin: Secondary | ICD-10-CM

## 2020-01-04 DIAGNOSIS — E1165 Type 2 diabetes mellitus with hyperglycemia: Secondary | ICD-10-CM

## 2020-01-04 DIAGNOSIS — E669 Obesity, unspecified: Secondary | ICD-10-CM

## 2020-01-04 DIAGNOSIS — Z6832 Body mass index (BMI) 32.0-32.9, adult: Secondary | ICD-10-CM

## 2020-01-04 DIAGNOSIS — E785 Hyperlipidemia, unspecified: Secondary | ICD-10-CM

## 2020-01-04 NOTE — Telephone Encounter (Signed)
Patient say she needs letter stating she is released to go back to work on 01/09/20

## 2020-01-04 NOTE — Telephone Encounter (Signed)
Please find out why the date Feb 9 was chosen by patient for her return to work note. .  It needs to be 14 days from the date of exposure to the COVID positive family member.

## 2020-01-04 NOTE — Research (Signed)
Phone call to patient form Visit 8 d/t COVID-19 precautiions. Patient is doing well. No chest pains or shortness of breath.                                     "CONSENT"   YES     NO   Continuing further Investigational Product and study visits for follow-up? [x]  []   Continuing consent from future biomedical research [x]  []                                      "EVENTS"    YES     NO  AE   (IF YES SEE SOURCE) []  [x]   SAE  (IF YES SEE SOURCE) []  [x]   ENDPOINT   (IF YES SEE SOURCE) []  [x]   REVASCULARIZATION  (IF YES SEE SOURCE) []  [x]   AMPUTATION   (IF YES SEE SOURCE) []  [x]   TROPONIN'S  (IF YES SEE SOURCE) []  [x]    Lifestyle Adherence Assessment:    YES NO  Abstinence from smoking/remaining tobacco free X   Cardiac Diet X   Routine physical activity and/or cardiac rehabilitation X   [id number]  MOBILITY:    I HAVE NO PROBLEMS WALKING []   I HAVE SLIGHT PROBLEMS WALKING [x]   I HAVE MODERATE PROBLEMS WALKING []   I HAVE SEVERE PROBLEMS WALKING []   I AM UNABLE TO WALK  []     SELF-CARE:   I HAVE NO PROBLEMS WASING OR DRESSING MYSELF  [x]   I HAVE SLIGHT PROBLEMS WASHING OR DRESSING MYSELF  []   I HAVE MODERATE PROBLEMS WASHING OR DRESSING MYSELF []   I HAVE SEVERE PROBLEMS WASHING OR DRESSING MYSELF  []   I HAVE SEVERE PROBLEMS WASHING OR DRESSING MYSELF  []   I AM UNABLE TO WASH OR DRESS MYSELF []     USUAL ACTIVITIES: (E.G. WORK/STUDY/HOUSEWORK/FAMILY OR LEISURE ACTIVITIES.    I HAVE NO PROBLEMS DOING MY USUAL ACTIVITIES []   I HAVE SLIGHT PROBLEMS DOING MY USUAL ACTIVITIES [x]   I HAVE MODERATE PROBLEMS DOING MY USUAL ACTIVIITIES []   I HAVE SEVERE PROBLEMS DOING MY USUAL ACTIVITIES []   I AM UNABLE TO DO MY USUAL ACTIVITIES []     PAIN /DISCOMFORT   I HAVE NO PAIN OR DISCOMFORT []   I HAVE SLIGHT PAIN OR DISCOMFORT []   I HAVE MODERATE PAIN OR DISCOMFORT []   I HAVE SEVERE PAIN OR DISCOMFORT []   I HAVE EXTREME PAIN OR DISCOMFORT [x]     ANXIETY/DEPRESSION   I AM NOT ANXIOUS OR  DEPRESSED []   I AM SLIGHTLY ANXIOUS OR DEPRESSED []   I AM MODERATELY ANXIOUS OR DREPRESSED [x]   I AM SEVERELY ANXIOUS OR DEPRESSED []   I AM EXTREMELY ANXIOUS OR DEPRESSED []     SCALE OF 0-100 HOW WOULD YOU RATE TODAY?  0 IS THE WORSE AND 100 IS THE BEST HEALTH YOU CAN IMAGINE: 70      Current Outpatient Medications:  .  aspirin 81 MG chewable tablet, Chew 1 tablet (81 mg total) by mouth daily., Disp:  , Rfl:  .  atorvastatin (LIPITOR) 40 MG tablet, Take 1 tablet by mouth once daily, Disp: 90 tablet, Rfl: 0 .  Continuous Blood Gluc Sensor (FREESTYLE LIBRE 14 DAY SENSOR) MISC, 1 each by Does not apply route every 14 (fourteen) days. Change every 2 weeks, Disp: 2 each, Rfl: 11 .  diazepam (VALIUM) 5  MG tablet, TAKE 1 TO 2 TABLETS BY MOUTH 30 MINUTES BEFORE ESI, Disp: , Rfl:  .  doxycycline (VIBRA-TABS) 100 MG tablet, Take 1 tablet (100 mg total) by mouth 2 (two) times daily., Disp: 20 tablet, Rfl: 0 .  gabapentin (NEURONTIN) 300 MG capsule, Take 1 capsule (300 mg total) by mouth 3 (three) times daily., Disp: 90 capsule, Rfl: 3 .  glucose blood (BAYER CONTOUR TEST) test strip, TEST BLOOD SUGARS 3 TIMES DAILY, Disp: 200 each, Rfl: 11 .  insulin NPH Human (NOVOLIN N RELION) 100 UNIT/ML injection, Inject 0.2-0.55 mLs (20-55 Units total) into the skin 2 (two) times daily before a meal. (Patient taking differently: Inject into the skin 2 (two) times daily before a meal. 15 units in the morning and 40 units at night), Disp: , Rfl:  .  insulin regular (NOVOLIN R) 100 units/mL injection, Inject 10 Units into the skin daily. 10 units at noon, Disp: , Rfl:  .  irbesartan (AVAPRO) 75 MG tablet, Take 1 tablet (75 mg total) by mouth daily., Disp: 30 tablet, Rfl: 3 .  Lancet Devices (EASY TOUCH LANCING DEVICE) MISC, Use to check diabetes 4 times daily  E 11.65, Disp: 1 each, Rfl: 11 .  latanoprost (XALATAN) 0.005 % ophthalmic solution, Place 1 drop into both eyes at bedtime. , Disp: , Rfl:  .  metFORMIN  (GLUCOPHAGE) 1000 MG tablet, TAKE 1 TABLET BY MOUTH TWICE DAILY WITH MEALS, Disp: 120 tablet, Rfl: 0 .  metoprolol succinate (TOPROL-XL) 50 MG 24 hr tablet, Take 1 tablet (50 mg total) by mouth daily. Take with or immediately following a meal., Disp: 30 tablet, Rfl: 3 .  Multiple Vitamin (MULTIVITAMIN) tablet, Take 1 tablet by mouth daily., Disp: , Rfl:  .  nystatin (NYSTATIN) powder, Apply powder to affected area twice daily until rash has resolved, Disp: 15 g, Rfl: 3 .  omeprazole (PRILOSEC) 40 MG capsule, Take 1 capsule by mouth once daily, Disp: 90 capsule, Rfl: 3 .  ondansetron (ZOFRAN ODT) 4 MG disintegrating tablet, Take 1 tablet (4 mg total) by mouth every 8 (eight) hours as needed for nausea or vomiting., Disp: 20 tablet, Rfl: 0 .  oxyCODONE-acetaminophen (PERCOCET) 7.5-325 MG tablet, Take 1 tablet by mouth 4 (four) times daily as needed., Disp: , Rfl:  .  ticagrelor (BRILINTA) 90 MG TABS tablet, Take 1 tablet (90 mg total) by mouth 2 (two) times daily., Disp: 60 tablet, Rfl: 3 .  tiZANidine (ZANAFLEX) 4 MG tablet, Take 4 mg by mouth 3 (three) times daily., Disp: , Rfl:  .  vitamin B-12 (CYANOCOBALAMIN) 1000 MCG tablet, Take 1,000 mcg by mouth daily., Disp: , Rfl:

## 2020-01-04 NOTE — Patient Instructions (Signed)
  Please continue:: Insulin Before breakfast Before lunch Before dinner  R 10-15 10-15 20-25  NPH 15 - 25   Continue Metformin 1000 mg twice a day.  Please let me know if the sugars stay consistently above 130 (before meals) or above 180 (after meals)  Please return in 4 months with your sugar log.

## 2020-01-04 NOTE — Progress Notes (Signed)
Patient ID: YITA PRETTI, female   DOB: [birth year], 59 y.o.   MRN: [id number]  Patient location: Home My location: Office Persons participating in the virtual visit: patient, provider  Referring Provider: Crecencio Mc, MD  I connected with the patient on 01/04/20 at  3:01 PM EST by a video enabled telemedicine application and verified that I am speaking with the correct person.   I discussed the limitations of evaluation and management by telemedicine and the availability of in person appointments. The patient expressed understanding and agreed to proceed.   Details of the encounter are shown below.  HPI: DERONDA ELDER is a 59 y.o.-year-old female, returning for f/u for DM2, dx ~2000, insulin-dependent since 2010, uncontrolled, with complications (PN, + DR). Last visit 4 months ago.  Since last visit, she was in the ED with chest pain and had increased troponins 10/2019 >> NSTEMI >> 3 stents. She still has pain in neck and shoulder but not CP. Her sister also had an AMI and she died around the same time.  At last visit, she had a pinched nerve in the back and carpal tunnel and was out of work, on pain medicines.  Sugars were higher and even had one spike at 497, but started to improve before our last visit.  Reviewed HbA1c levels: Lab Results  Component Value Date   HGBA1C 7.6 (A) 08/31/2019   HGBA1C 7.8 (A) 11/25/2018   HGBA1C 6.8 (A) 07/25/2018   Pt is on: Insulin Before breakfast Before lunch Before dinner  R 10-15 10-15 20-25  NPH 15 - 25    Metformin 1000 mg twice a day.  Pt is checking her sugars 1-2 times a day: - am:  38 x1, 140-164 >> 59, 68, 84, 94-148, 173 >> 49, 95-140, 226 (forgot insulin) - 2h after b'fast: 154 >> n/c > 203 >> n/c >> 223 >> n/c - lunch: 80-94 >> 100-118 >> n/c >> 140 >> 81, 87 - mid-afternoon: 45x1 , 90-115 >> n/c >> 248 >> 116, 189 - before dinner: 135 >> 108-114 >> n/c  - 2h after dinner:  216, 219, 451 >> n/c  - bedtime: 119, 143-167,  288  >> 107, 140-180, 196 >> 104-159  - nighttime: 145-239, 338 >> n/c >> 188, 200, 497 >> n/c Lowest sugar was 38 (inj insulin but ate only little) >> 59 >> 49; she has hypoglycemia awareness in the 80s. Highest sugar was  288 >> 497 >> 226.  -No CKD, last BUN/creatinine:  Lab Results  Component Value Date   BUN 15 12/08/2019   CREATININE 0.74 12/08/2019   ACR normal: Lab Results  Component Value Date   MICRALBCREAT 1.8 06/27/2018   MICRALBCREAT 1.6 06/23/2017   MICRALBCREAT 0.7 06/19/2016   MICRALBCREAT 0.4 09/25/2015   MICRALBCREAT 0.3 07/30/2014   MICRALBCREAT 0.5 06/19/2013   MICRALBCREAT 1.5 01/31/2013   MICRALBCREAT 2.6 11/20/2011  She is not on ACE inhibitor/ARB.  -+ HL; last set of lipids: Lab Results  Component Value Date   CHOL 147 12/08/2019   HDL 45 12/08/2019   LDLCALC 81 12/08/2019   LDLDIRECT 94.0 09/25/2015   TRIG 114 12/08/2019   CHOLHDL 4.4 10/09/2019  On Lipitor.  - last eye exam was in 12/2018: + DR OU, glaucoma- reportedly. She sees Dr. Arnoldo Morale.  -She has numbness and tingling in her feet. Seeing a podiatrist.  She takes 2 tablets of Neurontin a day.  She also has a history of HTN, episodic alcohol abuse, GERD, low  back pain, OA.   ROS: Constitutional: no weight gain/no weight loss, no fatigue, + subjective hyperthermia, no subjective hypothermia Eyes: no blurry vision, no xerophthalmia ENT: no sore throat, no nodules palpated in neck, no dysphagia, no odynophagia, no hoarseness Cardiovascular: no CP/no SOB/no palpitations/no leg swelling Respiratory: no cough/no SOB/no wheezing Gastrointestinal: no N/no V/no D/no C/no acid reflux Musculoskeletal: no muscle aches/no joint aches Skin: no rashes, no hair loss Neurological: no tremors/+ numbness/+ tingling/no dizziness  I reviewed pt's medications, allergies, PMH, social hx, family hx, and changes were documented in the history of present illness. Otherwise, unchanged from my initial visit  note.  Past Medical History:  Diagnosis Date  . Arthritis   . Campylobacter enteritis May 2012   [facility] admission  . Carpal tunnel syndrome, bilateral   . Diabetes mellitus   . Gastritis with bleeding due to alcohol 2010   [facility] admission  . GERD (gastroesophageal reflux disease)   . Hyperlipidemia   . Hypertension    no meds  . Menopause   . Neck pain   . Wears glasses    Past Surgical History:  Procedure Laterality Date  . CARPAL TUNNEL RELEASE Left 05/14/2014   Procedure: LEFT ULNAR NEUROPLASTY AT ELBOW AND ENDOSCOPIC CARPAL TUNNEL RELEASE;  Surgeon: Jolyn Nap, MD;  Location: [place];  Service: Orthopedics;  Laterality: Left;  . CORONARY STENT INTERVENTION N/A 10/09/2019   Procedure: CORONARY STENT INTERVENTION;  Surgeon: Adrian Prows, MD;  Location: [facility];  Service: Cardiovascular;  Laterality: N/A;  . CYST EXCISION  1995   under arms  . endometrial biopsy  2004   benign  . FOOT OSTEOTOMY  2010   right-spurs  . LEFT HEART CATH AND CORONARY ANGIOGRAPHY N/A 10/09/2019   Procedure: LEFT HEART CATH AND CORONARY ANGIOGRAPHY;  Surgeon: Adrian Prows, MD;  Location: [place] [facility];  Service: Cardiovascular;  Laterality: N/A;  . TONSILLECTOMY    . ULNAR NERVE TRANSPOSITION Left 05/14/2014   Procedure: ULNAR NERVE DECOMPRESSION/TRANSPOSITION;  Surgeon: Jolyn Nap, MD;  Location: [place];  Service: Orthopedics;  Laterality: Left;   Social History   Socioeconomic History  . Marital status: Single    Spouse name: Not on file  . Number of children: 2  . Years of education: 20  . Highest education level: High school graduate  Occupational History  . Not on file  Tobacco Use  . Smoking status: Former Smoker    Quit date: 05/09/2012    Years since quitting: 7.6  . Smokeless tobacco: Never Used  Substance and Sexual Activity  . Alcohol use: Not Currently  . Drug use: Yes    Types: Marijuana    Comment: sometimes  .  Sexual activity: Yes    Comment: same partner x 10 years  Other Topics Concern  . Not on file  Social History Narrative   She works as a Sports coach.   Right-handed.   One cup caffeine per day.   She lives at home with daughter.   Social Determinants of Health   Financial Resource Strain:   . Difficulty of Paying Living Expenses: Not on file  Food Insecurity:   . Worried About Charity fundraiser in the Last Year: Not on file  . Ran Out of Food in the Last Year: Not on file  Transportation Needs:   . Lack of Transportation (Medical): Not on file  . Lack of Transportation (Non-Medical): Not on file  Physical Activity:   .  Days of Exercise per Week: Not on file  . Minutes of Exercise per Session: Not on file  Stress:   . Feeling of Stress : Not on file  Social Connections:   . Frequency of Communication with Friends and Family: Not on file  . Frequency of Social Gatherings with Friends and Family: Not on file  . Attends Religious Services: Not on file  . Active Member of Clubs or Organizations: Not on file  . Attends Archivist Meetings: Not on file  . Marital Status: Not on file  Intimate Partner Violence:   . Fear of Current or Ex-Partner: Not on file  . Emotionally Abused: Not on file  . Physically Abused: Not on file  . Sexually Abused: Not on file   Current Outpatient Medications on File Prior to Visit  Medication Sig Dispense Refill  . aspirin 81 MG chewable tablet Chew 1 tablet (81 mg total) by mouth daily.    Marland Kitchen atorvastatin (LIPITOR) 40 MG tablet Take 1 tablet by mouth once daily 90 tablet 0  . Continuous Blood Gluc Sensor (FREESTYLE LIBRE 14 DAY SENSOR) MISC 1 each by Does not apply route every 14 (fourteen) days. Change every 2 weeks 2 each 11  . diazepam (VALIUM) 5 MG tablet TAKE 1 TO 2 TABLETS BY MOUTH 30 MINUTES BEFORE ESI    . doxycycline (VIBRA-TABS) 100 MG tablet Take 1 tablet (100 mg total) by mouth 2 (two) times daily. 20 tablet 0  . gabapentin  (NEURONTIN) 300 MG capsule Take 1 capsule (300 mg total) by mouth 3 (three) times daily. 90 capsule 3  . glucose blood (BAYER CONTOUR TEST) test strip TEST BLOOD SUGARS 3 TIMES DAILY 200 each 11  . insulin NPH Human (NOVOLIN N RELION) 100 UNIT/ML injection Inject 0.2-0.55 mLs (20-55 Units total) into the skin 2 (two) times daily before a meal. (Patient taking differently: Inject into the skin 2 (two) times daily before a meal. 15 units in the morning and 40 units at night)    . insulin regular (NOVOLIN R) 100 units/mL injection Inject 10 Units into the skin daily. 10 units at noon    . irbesartan (AVAPRO) 75 MG tablet Take 1 tablet (75 mg total) by mouth daily. 30 tablet 3  . Lancet Devices (EASY TOUCH LANCING DEVICE) MISC Use to check diabetes 4 times daily  E 11.65 1 each 11  . latanoprost (XALATAN) 0.005 % ophthalmic solution Place 1 drop into both eyes at bedtime.     . metFORMIN (GLUCOPHAGE) 1000 MG tablet TAKE 1 TABLET BY MOUTH TWICE DAILY WITH MEALS 120 tablet 0  . metoprolol succinate (TOPROL-XL) 50 MG 24 hr tablet Take 1 tablet (50 mg total) by mouth daily. Take with or immediately following a meal. 30 tablet 3  . Multiple Vitamin (MULTIVITAMIN) tablet Take 1 tablet by mouth daily.    Marland Kitchen nystatin (NYSTATIN) powder Apply powder to affected area twice daily until rash has resolved 15 g 3  . omeprazole (PRILOSEC) 40 MG capsule Take 1 capsule by mouth once daily 90 capsule 3  . ondansetron (ZOFRAN ODT) 4 MG disintegrating tablet Take 1 tablet (4 mg total) by mouth every 8 (eight) hours as needed for nausea or vomiting. 20 tablet 0  . oxyCODONE-acetaminophen (PERCOCET) 7.5-325 MG tablet Take 1 tablet by mouth 4 (four) times daily as needed.    . ticagrelor (BRILINTA) 90 MG TABS tablet Take 1 tablet (90 mg total) by mouth 2 (two) times daily. 60 tablet 3  .  tiZANidine (ZANAFLEX) 4 MG tablet Take 4 mg by mouth 3 (three) times daily.    . vitamin B-12 (CYANOCOBALAMIN) 1000 MCG tablet Take 1,000 mcg  by mouth daily.     No current facility-administered medications on file prior to visit.   Allergies  Allergen Reactions  . Oxycodone-Acetaminophen Other (See Comments)    Causes her to feel "hot".  . Penicillins Rash    Did it involve swelling of the face/tongue/throat, SOB, or low BP? No Did it involve sudden or severe rash/hives, skin peeling, or any reaction on the inside of your mouth or nose? No Did you need to seek medical attention at a hospital or doctor's office? No When did it last happen? If all above answers are "NO", may proceed with cephalosporin use.   Family History  Problem Relation Age of Onset  . Hypertension Mother   . Diabetes Mother   . Heart attack Mother   . Stroke Father   . Diabetes Father   . Diabetes Maternal Grandmother   . Hypertension Maternal Grandmother   . Heart attack Sister     PE: There were no vitals taken for this visit. There is no height or weight on file to calculate BMI. Wt Readings from Last 3 Encounters:  12/05/19 196 lb 6.9 oz (89.1 kg)  11/29/19 194 lb 12.8 oz (88.4 kg)  11/07/19 194 lb 12.8 oz (88.4 kg)   Constitutional:  in NAD  The physical exam was not performed (virtual visit).  ASSESSMENT: 1. DM2, insulin-dependent, uncontrolled, with complications - peripheral neuropathy - DR   2. Obesity class 1 - BMI 32.79  BMI Classification:  < 18.5 underweight   18.5-24.9 normal weight   25.0-29.9 overweight   30.0-34.9 class I obesity   35.0-39.9 class II obesity   ? 40.0 class III obesity   3. HL  PLAN:  1. Patient with longstanding, uncontrolled type 2 diabetes, with improved control in the last 2 years after she started to take her insulin consistently.  She had periods in the past in which she was taking her insulin erratically and her diabetes was completely out of control.  At last visit, sugars are higher and more fluctuating per review of her meter downloads especially in the previous 2 weeks.  She  was on pain medicines for back pain and she felt that her hydrocodone was causing fluctuations in her blood sugars.  They were improving, though, with only few spikes in the 200s.  We did not change her regimen dose at that time but I advised her to let me know if the sugars are above or below goal and again reiterated the need to inject insulin 30 minutes before meals. -At this visit, she returns after having had an NSTEMI with 3 subsequent stents.  She will see cardiology and start cardiac rehab soon.  Sugars appear improved, with the goal 1 blood sugar at 49, after she took too much insulin the night before.  The rest of the sugars are mostly at or only slightly above goal.  For now, I would suggest to continue the same regimen with NPH, R insulin and also the same dose of Metformin. - I advised her to: Patient Instructions   Please continue: Insulin Before breakfast Before lunch Before dinner  R 10-15 10-15 20-25  NPH 15 - 25    Continue Metformin 1000 mg 2x a day.  Please let me know if the sugars stay consistently above 130 (before meals) or above 180 (after  meals)  Please return in 4 months with your sugar log.  - we will check her HbA1c when she returns to the clinic - advised to check sugars at different times of the day - 3x a day, rotating check times - advised for yearly eye exams >> she is UTD - return to clinic in 4 months     2. Obesity class 1 -Continue Metformin, which is weight stabilizing long-term -Before last visit, she gained 10 pounds -No weight loss since last visit  3. HL -Reviewed latest lipid panel from 12/2019: All fractions at goal Lab Results  Component Value Date   CHOL 147 12/08/2019   HDL 45 12/08/2019   LDLCALC 81 12/08/2019   LDLDIRECT 94.0 09/25/2015   TRIG 114 12/08/2019   CHOLHDL 4.4 10/09/2019  -Continues Lipitor without side effects  Philemon Kingdom, MD PhD [facility] Endocrinology

## 2020-01-05 ENCOUNTER — Inpatient Hospital Stay (HOSPITAL_COMMUNITY)
Admission: RE | Admit: 2020-01-05 | Discharge: 2020-01-05 | Disposition: A | Discharge status: Home / Self Care | Payer: BC Managed Care – PPO | Source: Ambulatory Visit

## 2020-01-05 ENCOUNTER — Ambulatory Visit: Payer: BC Managed Care – PPO | Attending: Internal Medicine

## 2020-01-05 ENCOUNTER — Ambulatory Visit (HOSPITAL_COMMUNITY): Payer: BC Managed Care – PPO

## 2020-01-05 ENCOUNTER — Other Ambulatory Visit: Payer: Self-pay

## 2020-01-05 DIAGNOSIS — Z20822 Contact with and (suspected) exposure to covid-19: Secondary | ICD-10-CM

## 2020-01-05 NOTE — Telephone Encounter (Signed)
Spoke with pt and she stated that she was exposed to covid on 12/25/2019 and so she was advised that she would be able to return to work on 01/10/2020. Pt gave a verbal understanding. Letter has been printed and signed and placed up front for a friend to come by and pick up.

## 2020-01-05 NOTE — Progress Notes (Signed)
Nutrition Note: Virtual Visit  Spoke with [name] for virtual cardiac rehab. She is still experiencing pain in shoulder and neck. We discussed her insulin and CBGs. She had a good report with endo yesterday and is continuing to follow her prescribed regimen for diabetes.   Diet recall reviewed. Nutrition goals reviewed. Activity: Continue checking CBGs and monitoring carb intake and eating consistently   Will continue to monitor pt on [facility] app and with phone calls when appropriate for virtual cardiac rehab.   Wendy Offer, MS, RDN, LDN

## 2020-01-07 LAB — NOVEL CORONAVIRUS, NAA: SARS-CoV-2, NAA: NOT DETECTED

## 2020-01-08 ENCOUNTER — Ambulatory Visit (HOSPITAL_COMMUNITY): Payer: BC Managed Care – PPO

## 2020-01-08 ENCOUNTER — Telehealth (HOSPITAL_COMMUNITY): Payer: Self-pay

## 2020-01-08 NOTE — Telephone Encounter (Signed)
Pt insurance is active and benefits verified through [employer] Co-pay 0, DED $1,250/0 met, out of pocket $4,890/$257.55 met, co-insurance 0. no pre-authorization required. Passport, 01/08/2020_0 :53am, REF# [id number]  Will contact patient to see if she is interested in the Cardiac Rehab Program. If interested, patient will need to complete follow up appt. Once completed, patient will be contacted for scheduling upon review by the RN Navigator.

## 2020-01-08 NOTE — Addendum Note (Signed)
Encounter addended by: Sol Passer on: 01/08/2020 11:24 AM  Actions taken: Flowsheet data copied forward, Flowsheet accepted

## 2020-01-09 ENCOUNTER — Encounter (HOSPITAL_COMMUNITY): Payer: Self-pay

## 2020-01-09 ENCOUNTER — Other Ambulatory Visit: Payer: Self-pay | Admitting: Internal Medicine

## 2020-01-09 DIAGNOSIS — I214 Non-ST elevation (NSTEMI) myocardial infarction: Secondary | ICD-10-CM

## 2020-01-09 DIAGNOSIS — Z955 Presence of coronary angioplasty implant and graft: Secondary | ICD-10-CM

## 2020-01-09 NOTE — Progress Notes (Signed)
Cardiac Individual Treatment Plan  Patient Details  Name: [name] MRN: [id number] Date of Birth: [birth year] Referring Provider:     CARDIAC REHAB PHASE II ORIENTATION from 12/05/2019 in [facility]  Referring Provider  Adrian Prows, MD      Initial Encounter Date:    CARDIAC REHAB PHASE II ORIENTATION from 12/05/2019 in [facility]  Date  12/05/19      Visit Diagnosis: NSTEMI (non-ST elevated myocardial infarction) [facility])  Status post coronary artery stent placement  Patient's Home Medications on Admission:  Current Outpatient Medications:  .  aspirin 81 MG chewable tablet, Chew 1 tablet (81 mg total) by mouth daily., Disp:  , Rfl:  .  atorvastatin (LIPITOR) 40 MG tablet, Take 1 tablet by mouth once daily, Disp: 90 tablet, Rfl: 0 .  Continuous Blood Gluc Sensor (FREESTYLE LIBRE 14 DAY SENSOR) MISC, 1 each by Does not apply route every 14 (fourteen) days. Change every 2 weeks, Disp: 2 each, Rfl: 11 .  diazepam (VALIUM) 5 MG tablet, TAKE 1 TO 2 TABLETS BY MOUTH 30 MINUTES BEFORE ESI, Disp: , Rfl:  .  doxycycline (VIBRA-TABS) 100 MG tablet, Take 1 tablet (100 mg total) by mouth 2 (two) times daily., Disp: 20 tablet, Rfl: 0 .  gabapentin (NEURONTIN) 300 MG capsule, Take 1 capsule (300 mg total) by mouth 3 (three) times daily., Disp: 90 capsule, Rfl: 3 .  glucose blood (BAYER CONTOUR TEST) test strip, TEST BLOOD SUGARS 3 TIMES DAILY, Disp: 200 each, Rfl: 11 .  insulin NPH Human (NOVOLIN N RELION) 100 UNIT/ML injection, Inject 0.2-0.55 mLs (20-55 Units total) into the skin 2 (two) times daily before a meal. (Patient taking differently: Inject into the skin 2 (two) times daily before a meal. 15 units in the morning and 40 units at night), Disp: , Rfl:  .  insulin regular (NOVOLIN R) 100 units/mL injection, Inject 10 Units into the skin daily. 10 units at noon, Disp: , Rfl:  .  irbesartan (AVAPRO) 75 MG tablet, Take 1 tablet (75 mg  total) by mouth daily., Disp: 30 tablet, Rfl: 3 .  Lancet Devices (EASY TOUCH LANCING DEVICE) MISC, Use to check diabetes 4 times daily  E 11.65, Disp: 1 each, Rfl: 11 .  latanoprost (XALATAN) 0.005 % ophthalmic solution, Place 1 drop into both eyes at bedtime. , Disp: , Rfl:  .  metFORMIN (GLUCOPHAGE) 1000 MG tablet, TAKE 1 TABLET BY MOUTH TWICE DAILY WITH MEALS, Disp: 120 tablet, Rfl: 0 .  metoprolol succinate (TOPROL-XL) 50 MG 24 hr tablet, Take 1 tablet (50 mg total) by mouth daily. Take with or immediately following a meal., Disp: 30 tablet, Rfl: 3 .  Multiple Vitamin (MULTIVITAMIN) tablet, Take 1 tablet by mouth daily., Disp: , Rfl:  .  nystatin (NYSTATIN) powder, Apply powder to affected area twice daily until rash has resolved, Disp: 15 g, Rfl: 3 .  omeprazole (PRILOSEC) 40 MG capsule, Take 1 capsule by mouth once daily, Disp: 90 capsule, Rfl: 3 .  ondansetron (ZOFRAN ODT) 4 MG disintegrating tablet, Take 1 tablet (4 mg total) by mouth every 8 (eight) hours as needed for nausea or vomiting., Disp: 20 tablet, Rfl: 0 .  oxyCODONE-acetaminophen (PERCOCET) 7.5-325 MG tablet, Take 1 tablet by mouth 4 (four) times daily as needed., Disp: , Rfl:  .  ticagrelor (BRILINTA) 90 MG TABS tablet, Take 1 tablet (90 mg total) by mouth 2 (two) times daily., Disp: 60 tablet, Rfl: 3 .  tiZANidine (ZANAFLEX) 4 MG tablet, Take 4 mg by mouth 3 (three) times daily., Disp: , Rfl:  .  vitamin B-12 (CYANOCOBALAMIN) 1000 MCG tablet, Take 1,000 mcg by mouth daily., Disp: , Rfl:   Past Medical History: Past Medical History:  Diagnosis Date  . Arthritis   . Campylobacter enteritis May 2012   [facility] admission  . Carpal tunnel syndrome, bilateral   . Diabetes mellitus   . Gastritis with bleeding due to alcohol 2010   [facility] admission  . GERD (gastroesophageal reflux disease)   . Hyperlipidemia   . Hypertension    no meds  . Menopause   . Neck pain   . Wears glasses     Tobacco Use: Social History   Tobacco  Use  Smoking Status Former Smoker  . Quit date: 05/09/2012  . Years since quitting: 7.6  Smokeless Tobacco Never Used    Labs: Recent Review Flowsheet Data    Labs for ITP Cardiac and Pulmonary Rehab Latest Ref Rng & Units 11/25/2018 12/21/2018 08/31/2019 10/09/2019 12/08/2019   Cholestrol 100 - 199 mg/dL - 238(H) - 172 147   LDLCALC 0 - 99 mg/dL - 150(H) - 107(H) 81   LDLDIRECT mg/dL - - - - -   HDL >39 mg/dL - 69 - 39(L) 45   Trlycerides 0 - 149 mg/dL - 87 - 132 114   Hemoglobin A1c 4.0 - 5.6 % 7.8(A) - 7.6(A) - -      Capillary Blood Glucose: Lab Results  Component Value Date   GLUCAP 280 (H) 12/05/2019   GLUCAP 232 (H) 10/10/2019   GLUCAP 290 (H) 10/10/2019   GLUCAP 296 (H) 10/09/2019   GLUCAP 242 (H) 10/09/2019     Exercise Target Goals: Exercise Program Goal: Individual exercise prescription set using results from initial 6 min walk test and THRR while considering  patient's activity barriers and safety.   Exercise Prescription Goal: Initial exercise prescription builds to 30-45 minutes a day of aerobic activity, 2-3 days per week.  Home exercise guidelines will be given to patient during program as part of exercise prescription that the participant will acknowledge.  Activity Barriers & Risk Stratification: Activity Barriers & Cardiac Risk Stratification - 12/05/19 1440      Activity Barriers & Cardiac Risk Stratification   Activity Barriers  Other (comment);Assistive Device    Comments  Patient states that she has chronic neck, shoulder, left knee, and bilateral ankle pain.    Cardiac Risk Stratification  High       6 Minute Walk: 6 Minute Walk    Row Name 12/05/19 1513         6 Minute Walk   Phase  Initial     Distance  496 feet     Walk Time  5.45 minutes     # of Rest Breaks  1     MPH  1.03     METS  1.61     RPE  13     Perceived Dyspnea   0     VO2 Peak  5.65     Symptoms  Yes (comment)     Comments  Patient c/o neck, shoulder, and right ankle  pain, which is chronic for her. Patient rated her pain a 10/10 on the pain scale, and 1 seted rest break taken for 33 seconds.     Resting HR  88 bpm     Resting BP  108/78     Resting Oxygen Saturation   99 %  Exercise Oxygen Saturation  during 6 min walk  100 %     Max Ex. HR  96 bpm     Max Ex. BP  120/72     2 Minute Post BP  122/68        Oxygen Initial Assessment:   Oxygen Re-Evaluation:   Oxygen Discharge (Final Oxygen Re-Evaluation):   Initial Exercise Prescription: Initial Exercise Prescription - 12/06/19 0800      Date of Initial Exercise RX and Referring Provider   Date  12/05/19    Referring Provider  Adrian Prows, MD      Track   Minutes  30      Prescription Details   Frequency (times per week)  4-5    Duration  Progress to 30 minutes of continuous aerobic without signs/symptoms of physical distress      Intensity   THRR 40-80% of Max Heartrate  64-129    Ratings of Perceived Exertion  11-13    Perceived Dyspnea  0-4      Progression   Progression  Continue to progress workloads to maintain intensity without signs/symptoms of physical distress.      Resistance Training   Training Prescription  Yes    Reps  10-15       Perform Capillary Blood Glucose checks as needed.  Exercise Prescription Changes:   Exercise Comments:  Exercise Comments    Row Name 12/05/19 1440 01/08/20 1123         Exercise Comments  Reviewed home exercise plan with patient. Patient plans to use the virtual cardiac rehab app to log her exercise.  Patient will be contacted to begin participation in the onsite cardiac rehab program after temporary closure due to COVID-19 pandemic.         Exercise Goals and Review:  Exercise Goals    Row Name 12/05/19 1440             Exercise Goals   Increase Physical Activity  Yes       Intervention  Provide advice, education, support and counseling about physical activity/exercise needs.;Develop an individualized exercise  prescription for aerobic and resistive training based on initial evaluation findings, risk stratification, comorbidities and participant's personal goals.       Expected Outcomes  Short Term: Attend rehab on a regular basis to increase amount of physical activity.;Long Term: Exercising regularly at least 3-5 days a week.;Long Term: Add in home exercise to make exercise part of routine and to increase amount of physical activity.       Increase Strength and Stamina  Yes       Intervention  Provide advice, education, support and counseling about physical activity/exercise needs.;Develop an individualized exercise prescription for aerobic and resistive training based on initial evaluation findings, risk stratification, comorbidities and participant's personal goals.       Expected Outcomes  Short Term: Increase workloads from initial exercise prescription for resistance, speed, and METs.;Short Term: Perform resistance training exercises routinely during rehab and add in resistance training at home;Long Term: Improve cardiorespiratory fitness, muscular endurance and strength as measured by increased METs and functional capacity (6MWT)       Able to understand and use rate of perceived exertion (RPE) scale  Yes       Intervention  Provide education and explanation on how to use RPE scale       Expected Outcomes  Short Term: Able to use RPE daily in rehab to express subjective intensity level;Long Term:  Able to  use RPE to guide intensity level when exercising independently       Knowledge and understanding of Target Heart Rate Range (THRR)  Yes       Intervention  Provide education and explanation of THRR including how the numbers were predicted and where they are located for reference       Expected Outcomes  Short Term: Able to state/look up THRR;Long Term: Able to use THRR to govern intensity when exercising independently;Short Term: Able to use daily as guideline for intensity in rehab       Able to check  pulse independently  Yes       Intervention  Provide education and demonstration on how to check pulse in carotid and radial arteries.;Review the importance of being able to check your own pulse for safety during independent exercise       Expected Outcomes  Long Term: Able to check pulse independently and accurately;Short Term: Able to explain why pulse checking is important during independent exercise       Understanding of Exercise Prescription  Yes       Intervention  Provide education, explanation, and written materials on patient's individual exercise prescription       Expected Outcomes  Short Term: Able to explain program exercise prescription;Long Term: Able to explain home exercise prescription to exercise independently          Exercise Goals Re-Evaluation : Exercise Goals Re-Evaluation    Row Name 01/08/20 1122             Exercise Goal Re-Evaluation   Comments  Patient will be contacted to begin participation in the onsite cardiac rehab program after temporary closure due to COVID-19 pandemic. Patient has been encouraged to exercise at home but is limited by chronic pain.       Expected Outcomes  Patient will be transitioned to the onsite cardiac rehab program if she's able to participate.          Discharge Exercise Prescription (Final Exercise Prescription Changes):   Nutrition:  Target Goals: Understanding of nutrition guidelines, daily intake of sodium 1500mg , cholesterol 200mg , calories 30% from fat and 7% or less from saturated fats, daily to have 5 or more servings of fruits and vegetables.  Biometrics: Pre Biometrics - 12/05/19 1445      Pre Biometrics   Height  5' 4.25" (1.632 m)    Weight  196 lb 6.9 oz (89.1 kg)    Waist Circumference  44 inches    Hip Circumference  48.75 inches    Waist to Hip Ratio  0.9 %    BMI (Calculated)  33.45    Triceps Skinfold  13 mm    % Body Fat  40.5 %    Grip Strength  13 kg    Flexibility  --   Not attempted due to  neck and shoulder pain.   Single Leg Stand  7.87 seconds        Nutrition Therapy Plan and Nutrition Goals:   Nutrition Assessments:   Nutrition Goals Re-Evaluation:   Nutrition Goals Re-Evaluation:   Nutrition Goals Discharge (Final Nutrition Goals Re-Evaluation):   Psychosocial: Target Goals: Acknowledge presence or absence of significant depression and/or stress, maximize coping skills, provide positive support system. Participant is able to verbalize types and ability to use techniques and skills needed for reducing stress and depression.  Initial Review & Psychosocial Screening: Initial Psych Review & Screening - 12/05/19 1402      Initial Review  Current issues with  None Identified      Family Dynamics   Good Support System?  Yes    Comments  Ms. Trautwein has a positive attitude and outlook related to her cardiovascular health. She is discouraged that she continues to have right shoulder/arm pain that is preventing her from returning to work. She is in the process of completing disability papers. She admits to having a strong support system. No psychosocial interventions needed at this time.      Barriers   Psychosocial barriers to participate in program  There are no identifiable barriers or psychosocial needs.      Screening Interventions   Interventions  Encouraged to exercise       Quality of Life Scores: Quality of Life - 12/05/19 1600      Quality of Life   Select  Quality of Life      Quality of Life Scores   Health/Function Pre  23.82 %    Socioeconomic Pre  23.36 %    Psych/Spiritual Pre  26.57 %    Family Pre  25.5 %    GLOBAL Pre  24.53 %      Scores of 19 and below usually indicate a poorer quality of life in these areas.  A difference of  2-3 points is a clinically meaningful difference.  A difference of 2-3 points in the total score of the Quality of Life Index has been associated with significant improvement in overall quality of life,  self-image, physical symptoms, and general health in studies assessing change in quality of life.  PHQ-9: Recent Review Flowsheet Data    Depression screen [facility] 2/9 08/10/2019 08/16/2017 05/04/2017 06/19/2016 11/15/2012   Decreased Interest 0 0 0 0 0   Down, Depressed, Hopeless 0 0 0 0 0   PHQ - 2 Score 0 0 0 0 0     Interpretation of Total Score  Total Score Depression Severity:  1-4 = Minimal depression, 5-9 = Mild depression, 10-14 = Moderate depression, 15-19 = Moderately severe depression, 20-27 = Severe depression   Psychosocial Evaluation and Intervention:   Psychosocial Re-Evaluation:   Psychosocial Discharge (Final Psychosocial Re-Evaluation):   Vocational Rehabilitation: Provide vocational rehab assistance to qualifying candidates.   Vocational Rehab Evaluation & Intervention: Vocational Rehab - 12/05/19 1408      Initial Vocational Rehab Evaluation & Intervention   Assessment shows need for Vocational Rehabilitation  No      Vocational Rehab Re-Evaulation   Comments  patient currently persuing disability       Education: Education Goals: Education classes will be provided on a weekly basis, covering required topics. Participant will state understanding/return demonstration of topics presented.  Learning Barriers/Preferences:   Education Topics: Count Your Pulse:  -Group instruction provided by verbal instruction, demonstration, patient participation and written materials to support subject.  Instructors address importance of being able to find your pulse and how to count your pulse when at home without a heart monitor.  Patients get hands on experience counting their pulse with staff help and individually.   Heart Attack, Angina, and Risk Factor Modification:  -Group instruction provided by verbal instruction, video, and written materials to support subject.  Instructors address signs and symptoms of angina and heart attacks.    Also discuss risk factors for  heart disease and how to make changes to improve heart health risk factors.   Functional Fitness:  -Group instruction provided by verbal instruction, demonstration, patient participation, and written materials to support subject.  Instructors address safety measures for doing things around the house.  Discuss how to get up and down off the floor, how to pick things up properly, how to safely get out of a chair without assistance, and balance training.   Meditation and Mindfulness:  -Group instruction provided by verbal instruction, patient participation, and written materials to support subject.  Instructor addresses importance of mindfulness and meditation practice to help reduce stress and improve awareness.  Instructor also leads participants through a meditation exercise.    Stretching for Flexibility and Mobility:  -Group instruction provided by verbal instruction, patient participation, and written materials to support subject.  Instructors lead participants through series of stretches that are designed to increase flexibility thus improving mobility.  These stretches are additional exercise for major muscle groups that are typically performed during regular warm up and cool down.   Hands Only CPR:  -Group verbal, video, and participation provides a basic overview of AHA guidelines for community CPR. Role-play of emergencies allow participants the opportunity to practice calling for help and chest compression technique with discussion of AED use.   Hypertension: -Group verbal and written instruction that provides a basic overview of hypertension including the most recent diagnostic guidelines, risk factor reduction with self-care instructions and medication management.    Nutrition I class: Heart Healthy Eating:  -Group instruction provided by PowerPoint slides, verbal discussion, and written materials to support subject matter. The instructor gives an explanation and review of the  Therapeutic Lifestyle Changes diet recommendations, which includes a discussion on lipid goals, dietary fat, sodium, fiber, plant stanol/sterol esters, sugar, and the components of a well-balanced, healthy diet.   Nutrition II class: Lifestyle Skills:  -Group instruction provided by PowerPoint slides, verbal discussion, and written materials to support subject matter. The instructor gives an explanation and review of label reading, grocery shopping for heart health, heart healthy recipe modifications, and ways to make healthier choices when eating out.   Diabetes Question & Answer:  -Group instruction provided by PowerPoint slides, verbal discussion, and written materials to support subject matter. The instructor gives an explanation and review of diabetes co-morbidities, pre- and post-prandial blood glucose goals, pre-exercise blood glucose goals, signs, symptoms, and treatment of hypoglycemia and hyperglycemia, and foot care basics.   Diabetes Blitz:  -Group instruction provided by PowerPoint slides, verbal discussion, and written materials to support subject matter. The instructor gives an explanation and review of the physiology behind type 1 and type 2 diabetes, diabetes medications and rational behind using different medications, pre- and post-prandial blood glucose recommendations and Hemoglobin A1c goals, diabetes diet, and exercise including blood glucose guidelines for exercising safely.    Portion Distortion:  -Group instruction provided by PowerPoint slides, verbal discussion, written materials, and food models to support subject matter. The instructor gives an explanation of serving size versus portion size, changes in portions sizes over the last 20 years, and what consists of a serving from each food group.   Stress Management:  -Group instruction provided by verbal instruction, video, and written materials to support subject matter.  Instructors review role of stress in heart  disease and how to cope with stress positively.     Exercising on Your Own:  -Group instruction provided by verbal instruction, power point, and written materials to support subject.  Instructors discuss benefits of exercise, components of exercise, frequency and intensity of exercise, and end points for exercise.  Also discuss use of nitroglycerin and activating EMS.  Review options of places to exercise outside  of rehab.  Review guidelines for sex with heart disease.   Cardiac Drugs I:  -Group instruction provided by verbal instruction and written materials to support subject.  Instructor reviews cardiac drug classes: antiplatelets, anticoagulants, beta blockers, and statins.  Instructor discusses reasons, side effects, and lifestyle considerations for each drug class.   Cardiac Drugs II:  -Group instruction provided by verbal instruction and written materials to support subject.  Instructor reviews cardiac drug classes: angiotensin converting enzyme inhibitors (ACE-I), angiotensin II receptor blockers (ARBs), nitrates, and calcium channel blockers.  Instructor discusses reasons, side effects, and lifestyle considerations for each drug class.   Anatomy and Physiology of the Circulatory System:  Group verbal and written instruction and models provide basic cardiac anatomy and physiology, with the coronary electrical and arterial systems. Review of: AMI, Angina, Valve disease, Heart Failure, Peripheral Artery Disease, Cardiac Arrhythmia, Pacemakers, and the ICD.   Other Education:  -Group or individual verbal, written, or video instructions that support the educational goals of the cardiac rehab program.   Holiday Eating Survival Tips:  -Group instruction provided by PowerPoint slides, verbal discussion, and written materials to support subject matter. The instructor gives patients tips, tricks, and techniques to help them not only survive but enjoy the holidays despite the onslaught of food  that accompanies the holidays.   Knowledge Questionnaire Score: Knowledge Questionnaire Score - 12/05/19 1600      Knowledge Questionnaire Score   Pre Score  21/24       Core Components/Risk Factors/Patient Goals at Admission: Personal Goals and Risk Factors at Admission - 12/05/19 1440      Core Components/Risk Factors/Patient Goals on Admission    Weight Management  Yes;Obesity    Intervention  Weight Management/Obesity: Establish reasonable short term and long term weight goals.;Obesity: Provide education and appropriate resources to help participant work on and attain dietary goals.    Admit Weight  196 lb 6.9 oz (89.1 kg)    Expected Outcomes  Short Term: Continue to assess and modify interventions until short term weight is achieved;Weight Loss: Understanding of general recommendations for a balanced deficit meal plan, which promotes 1-2 lb weight loss per week and includes a negative energy balance of [phone] kcal/d;Long Term: Adherence to nutrition and physical activity/exercise program aimed toward attainment of established weight goal    Diabetes  Yes    Intervention  Provide education about signs/symptoms and action to take for hypo/hyperglycemia.;Provide education about proper nutrition, including hydration, and aerobic/resistive exercise prescription along with prescribed medications to achieve blood glucose in normal ranges: Fasting glucose 65-99 mg/dL    Expected Outcomes  Short Term: Participant verbalizes understanding of the signs/symptoms and immediate care of hyper/hypoglycemia, proper foot care and importance of medication, aerobic/resistive exercise and nutrition plan for blood glucose control.;Long Term: Attainment of HbA1C < 7%.    Hypertension  Yes    Intervention  Provide education on lifestyle modifcations including regular physical activity/exercise, weight management, moderate sodium restriction and increased consumption of fresh fruit, vegetables, and low fat  dairy, alcohol moderation, and smoking cessation.;Monitor prescription use compliance.    Expected Outcomes  Short Term: Continued assessment and intervention until BP is < 140/47mm HG in hypertensive participants. < 130/35mm HG in hypertensive participants with diabetes, heart failure or chronic kidney disease.;Long Term: Maintenance of blood pressure at goal levels.    Lipids  Yes    Intervention  Provide education and support for participant on nutrition & aerobic/resistive exercise along with prescribed medications to achieve LDL 70mg ,  HDL >40mg .    Expected Outcomes  Short Term: Participant states understanding of desired cholesterol values and is compliant with medications prescribed. Participant is following exercise prescription and nutrition guidelines.;Long Term: Cholesterol controlled with medications as prescribed, with individualized exercise RX and with personalized nutrition plan. Value goals: LDL < 70mg , HDL > 40 mg.       Core Components/Risk Factors/Patient Goals Review:  Goals and Risk Factor Review    Row Name 01/09/20 1053             Core Components/Risk Factors/Patient Goals Review   Personal Goals Review  Weight Management/Obesity;Lipids;Diabetes;Hypertension       Review  Patient with multiple CAD risk factors. She is independently exercising at home.       Expected Outcomes  Patient will continue to participate in CR to reduce CAD risk factors and manager her health.          Core Components/Risk Factors/Patient Goals at Discharge (Final Review):  Goals and Risk Factor Review - 01/09/20 1053      Core Components/Risk Factors/Patient Goals Review   Personal Goals Review  Weight Management/Obesity;Lipids;Diabetes;Hypertension    Review  Patient with multiple CAD risk factors. She is independently exercising at home.    Expected Outcomes  Patient will continue to participate in CR to reduce CAD risk factors and manager her health.       ITP Comments: ITP  Comments    Row Name 12/05/19 1501 01/09/20 1030         ITP Comments  Dr. Fransico Him, Medical Director [facility]  30 day ITP review: Cardiac Rehab is returning to in-patient exercise sessions. Patient will be contacted to see if interested in enrolling. Patient is currently independently exercising and following her CR plan of care.         Comments: see ITP comments

## 2020-01-10 ENCOUNTER — Encounter (HOSPITAL_COMMUNITY): Payer: Self-pay

## 2020-01-10 ENCOUNTER — Ambulatory Visit (HOSPITAL_COMMUNITY): Payer: BC Managed Care – PPO

## 2020-01-10 NOTE — Progress Notes (Signed)
Phone call to Pt to inquire about interest for in person CR program. Pt was interested in enrolling in the program. Pt was scheduled to start CR program 01/17/2020. Pt understands COVID 19 protocols.

## 2020-01-12 ENCOUNTER — Ambulatory Visit (HOSPITAL_COMMUNITY): Payer: BC Managed Care – PPO

## 2020-01-12 DIAGNOSIS — Z0279 Encounter for issue of other medical certificate: Secondary | ICD-10-CM

## 2020-01-15 ENCOUNTER — Ambulatory Visit (HOSPITAL_COMMUNITY): Payer: BC Managed Care – PPO

## 2020-01-17 ENCOUNTER — Other Ambulatory Visit: Payer: Self-pay

## 2020-01-17 ENCOUNTER — Encounter (HOSPITAL_COMMUNITY)
Admission: RE | Admit: 2020-01-17 | Discharge: 2020-01-17 | Disposition: A | Discharge status: Home / Self Care | Payer: BC Managed Care – PPO | Source: Ambulatory Visit | Attending: Cardiology | Admitting: Cardiology

## 2020-01-17 ENCOUNTER — Ambulatory Visit (HOSPITAL_COMMUNITY): Payer: BC Managed Care – PPO

## 2020-01-17 DIAGNOSIS — Z955 Presence of coronary angioplasty implant and graft: Secondary | ICD-10-CM | POA: Diagnosis present

## 2020-01-17 DIAGNOSIS — I214 Non-ST elevation (NSTEMI) myocardial infarction: Secondary | ICD-10-CM | POA: Diagnosis not present

## 2020-01-17 LAB — GLUCOSE, CAPILLARY
Glucose-Capillary: 44 mg/dL — CL (ref 70–99)
Glucose-Capillary: 50 mg/dL — ABNORMAL LOW (ref 70–99)
Glucose-Capillary: 85 mg/dL (ref 70–99)

## 2020-01-17 NOTE — Progress Notes (Signed)
Incomplete Session Note  Patient Details  Name: [name] MRN: [id number] Date of Birth: 1961/08/30 Referring Provider:     CARDIAC REHAB PHASE II ORIENTATION from 12/05/2019 in [place]  Referring Provider  Adrian Prows, MD       [name] did not complete her rehab session. [name] presented 30 min late to her first in-person cardiac rehab session. She was also informed of her insurance coverage for CPT codes [phone] and [phone]. Per coverage information, patient will have to meet 1200.00 deductible. This is not financially affordable for her however she wishes to consider. She will let staff know if she plans to return.  During insurance discussion, patient began to complain of "shakiness" and requested a piece of candy. CBG 44. She was given 15 grams of glucose gel and 15 grams of soda. CBG increased to 50. Patient asymptomatic at this time. States shakiness resolved. She was given an additional 15 grams of carbs (soda). CBG increased to 85. Patient was then provided gram crackers and peanut butter. She was discharged asymptomatic and instructed to eat ASAP. During assessment patient states she ate breakfast including "goulosh". She did not eat lunch. She took all medications including long acting and short acting insulin. Patient is counseled on importance of checking blood sugars as prescribed and not skipping meals. Patient verbalized understanding. She has had 3 individual sessions with RD and detailed information was provided on diabetes management and nutrition.  [name] E. Rollene Rotunda RN, BSN [place]. [facility]  Cardiac and Pulmonary Rehabilitation [place] Direct: [phone]

## 2020-01-19 ENCOUNTER — Ambulatory Visit (HOSPITAL_COMMUNITY): Payer: BC Managed Care – PPO

## 2020-01-19 ENCOUNTER — Other Ambulatory Visit: Payer: Self-pay

## 2020-01-19 ENCOUNTER — Ambulatory Visit: Payer: BC Managed Care – PPO | Admitting: Cardiology

## 2020-01-19 ENCOUNTER — Encounter (HOSPITAL_COMMUNITY): Payer: BC Managed Care – PPO

## 2020-01-19 ENCOUNTER — Encounter: Payer: Self-pay | Admitting: Cardiology

## 2020-01-19 VITALS — BP 138/93 | HR 78 | Ht 64.25 in | Wt 197.8 lb

## 2020-01-19 DIAGNOSIS — Z006 Encounter for examination for normal comparison and control in clinical research program: Secondary | ICD-10-CM

## 2020-01-19 DIAGNOSIS — I25118 Atherosclerotic heart disease of native coronary artery with other forms of angina pectoris: Secondary | ICD-10-CM

## 2020-01-19 DIAGNOSIS — E782 Mixed hyperlipidemia: Secondary | ICD-10-CM

## 2020-01-19 DIAGNOSIS — I10 Essential (primary) hypertension: Secondary | ICD-10-CM

## 2020-01-19 MED ORDER — EZETIMIBE 10 MG PO TABS: 10.0000 mg | ORAL_TABLET | Freq: Every day | ORAL | 3 refills | Status: DC

## 2020-01-19 NOTE — Progress Notes (Signed)
Primary Physician/Referring:  Crecencio Mc, MD  Patient ID: [name], female    DOB: 1961/08/03, 59 y.o.   MRN: [id number]  Chief Complaint  Patient presents with  . Coronary Artery Disease    3 montj f/u  . Hypertension   HPI:    [name]  is a 59 y.o. AAF  with uncontrolled diabetes mellitus, hyperlipidemia, prior cigarette smoking quit in 2013, smokes marijuana occasionally, CAD  Presenting with NSTEMI, S/P stenting to mid right and also to the distal LAD by implantation of DES on 10/09/2019.  She has not had any further chest pain but continues to have severe pain in her neck and also her arms constantly which is clearly radicular pain.   She is presently doing well and has not had recurrence of angina pectoris, from cardiac standpoint although it has only been 3 months, if her neck pain continues to be a major issue.  No recurrence of chest pain. No dyspnea.   Past Medical History:  Diagnosis Date  . Arthritis   . Campylobacter enteritis May 2012   [facility] admission  . Carpal tunnel syndrome, bilateral   . Diabetes mellitus   . Gastritis with bleeding due to alcohol 2010   [facility] admission  . GERD (gastroesophageal reflux disease)   . Hyperlipidemia   . Hypertension    no meds  . Menopause   . Neck pain   . Wears glasses    Past Surgical History:  Procedure Laterality Date  . CARPAL TUNNEL RELEASE Left 05/14/2014   Procedure: LEFT ULNAR NEUROPLASTY AT ELBOW AND ENDOSCOPIC CARPAL TUNNEL RELEASE;  Surgeon: Jolyn Nap, MD;  Location: [facility];  Service: Orthopedics;  Laterality: Left;  . CORONARY STENT INTERVENTION N/A 10/09/2019   Procedure: CORONARY STENT INTERVENTION;  Surgeon: Adrian Prows, MD;  Location: [facility];  Service: Cardiovascular;  Laterality: N/A;  . CYST EXCISION  1995   under arms  . endometrial biopsy  2004   benign  . FOOT OSTEOTOMY  2010   right-spurs  . LEFT HEART CATH AND CORONARY ANGIOGRAPHY N/A 10/09/2019    Procedure: LEFT HEART CATH AND CORONARY ANGIOGRAPHY;  Surgeon: Adrian Prows, MD;  Location: [facility];  Service: Cardiovascular;  Laterality: N/A;  . TONSILLECTOMY    . ULNAR NERVE TRANSPOSITION Left 05/14/2014   Procedure: ULNAR NERVE DECOMPRESSION/TRANSPOSITION;  Surgeon: Jolyn Nap, MD;  Location: [facility];  Service: Orthopedics;  Laterality: Left;   Social History   Socioeconomic History  . Marital status: Single    Spouse name: Not on file  . Number of children: 2  . Years of education: 51  . Highest education level: High school graduate  Occupational History  . Not on file  Tobacco Use  . Smoking status: Former Smoker    Quit date: 05/09/2012    Years since quitting: 7.7  . Smokeless tobacco: Never Used  Substance and Sexual Activity  . Alcohol use: Not Currently  . Drug use: Yes    Types: Marijuana    Comment: sometimes  . Sexual activity: Yes    Comment: same partner x 10 years  Other Topics Concern  . Not on file  Social History Narrative   She works as a Sports coach.   Right-handed.   One cup caffeine per day.   She lives at home with daughter.   Social Determinants of Health   Financial Resource Strain:   . Difficulty of Paying  Living Expenses: Not on file  Food Insecurity:   . Worried About Charity fundraiser in the Last Year: Not on file  . Ran Out of Food in the Last Year: Not on file  Transportation Needs:   . Lack of Transportation (Medical): Not on file  . Lack of Transportation (Non-Medical): Not on file  Physical Activity:   . Days of Exercise per Week: Not on file  . Minutes of Exercise per Session: Not on file  Stress:   . Feeling of Stress : Not on file  Social Connections:   . Frequency of Communication with Friends and Family: Not on file  . Frequency of Social Gatherings with Friends and Family: Not on file  . Attends Religious Services: Not on file  . Active Member of Clubs or Organizations: Not on file  .  Attends Archivist Meetings: Not on file  . Marital Status: Not on file  Intimate Partner Violence:   . Fear of Current or Ex-Partner: Not on file  . Emotionally Abused: Not on file  . Physically Abused: Not on file  . Sexually Abused: Not on file   ROS  Review of Systems  Cardiovascular: Negative for dyspnea on exertion and leg swelling.  Musculoskeletal: Positive for arthritis, joint pain and neck pain (right side of the neck and radiation to arm).  Gastrointestinal: Negative for melena.  Neurological: Positive for paresthesias.   Objective   Vitals with BMI 01/19/2020 12/05/2019 11/29/2019  Height 5' 4.25" 5' 4.25" 5\' 4"   Weight 197 lbs 13 oz 196 lbs 7 oz 194 lbs 13 oz  BMI 33.69 [id number]  Systolic 0000000 [id number] -  Diastolic 93 78 -  Pulse 78 88 -    Physical Exam  Constitutional:  She is moderately built and mildly obese in no acute distress.  Cardiovascular: Normal rate, regular rhythm, normal heart sounds and intact distal pulses. Exam reveals no gallop.  No murmur heard. Pulses:      Carotid pulses are 2+ on the right side and 2+ on the left side.      Femoral pulses are 2+ on the right side and 2+ on the left side.      Dorsalis pedis pulses are 1+ on the right side and 1+ on the left side.       Posterior tibial pulses are 1+ on the right side and 1+ on the left side.  No leg edema, no JVD.  Pulmonary/Chest: Effort normal and breath sounds normal.  Abdominal: Soft. Bowel sounds are normal.   Laboratory examination:   Recent Labs    10/10/19 0959 10/14/19 1025 12/08/19 0826  NA 138 136 141  K 3.8 4.0 4.4  CL 103 102 104  CO2 26 24 24   GLUCOSE 296* 180* 198*  BUN 6 12 15   CREATININE 0.66 0.57 0.74  CALCIUM 8.8* 8.7* 9.8  GFRNONAA >60 >60 89  GFRAA >60 >60 103   CrCl cannot be calculated (Patient's most recent lab result is older than the maximum 21 days allowed.).  CMP Latest Ref Rng & Units 12/08/2019 10/14/2019 10/10/2019  Glucose 65 - 99 mg/dL  198(H) 180(H) 296(H)  BUN 6 - 24 mg/dL 15 12 6   Creatinine 0.57 - 1.00 mg/dL 0.74 0.57 0.66  Sodium 134 - 144 mmol/L 141 136 138  Potassium 3.5 - 5.2 mmol/L 4.4 4.0 3.8  Chloride 96 - 106 mmol/L 104 102 103  CO2 20 - 29 mmol/L 24 24 26   Calcium 8.7 -  10.2 mg/dL 9.8 8.7(L) 8.8(L)  Total Protein 6.0 - 8.5 g/dL 7.2 - 6.2(L)  Total Bilirubin 0.0 - 1.2 mg/dL 0.3 - 0.7  Alkaline Phos 39 - 117 IU/L 125(H) - 89  AST 0 - 40 IU/L 15 - 14(L)  ALT 0 - 32 IU/L 22 - 17   CBC Latest Ref Rng & Units 12/08/2019 10/14/2019 10/10/2019  WBC 3.4 - 10.8 x10E3/uL 9.8 9.7 8.3  Hemoglobin 11.1 - 15.9 g/dL 12.6 11.2(L) 12.1  Hematocrit 34.0 - 46.6 % 38.3 34.8(L) 37.0  Platelets 150 - 450 x10E3/uL 379 306 273   Lipid Panel     Component Value Date/Time   CHOL 147 12/08/2019 0826   TRIG 114 12/08/2019 0826   HDL 45 12/08/2019 0826   CHOLHDL 4.4 10/09/2019 1000   VLDL 26 10/09/2019 1000   LDLCALC 81 12/08/2019 0826   LDLCALC 150 (H) 12/21/2018 1706   LDLDIRECT 94.0 09/25/2015 0956   HEMOGLOBIN A1C Lab Results  Component Value Date   HGBA1C 7.6 (A) 08/31/2019   TSH No results for input(s): TSH in the last 8760 hours. Medications and allergies   Allergies  Allergen Reactions  . Oxycodone-Acetaminophen Other (See Comments)    Causes her to feel "hot".  . Penicillins Rash    Did it involve swelling of the face/tongue/throat, SOB, or low BP? No Did it involve sudden or severe rash/hives, skin peeling, or any reaction on the inside of your mouth or nose? No Did you need to seek medical attention at a hospital or doctor's office? No When did it last happen? If all above answers are "NO", may proceed with cephalosporin use.    Current Outpatient Medications  Medication Instructions  . aspirin 81 mg, Oral, Daily  . atorvastatin (LIPITOR) 40 MG tablet Take 1 tablet by mouth once daily  . Continuous Blood Gluc Sensor (FREESTYLE LIBRE 14 DAY SENSOR) MISC 1 each, Does not apply, Every 14 days,  Change every 2 weeks  . ezetimibe (ZETIA) 10 mg, Oral, Daily after supper  . gabapentin (NEURONTIN) 300 mg, Oral, 3 times daily  . glucose blood (BAYER CONTOUR TEST) test strip TEST BLOOD SUGARS 3 TIMES DAILY  . insulin NPH Human (NOVOLIN N RELION) 20-55 Units, Subcutaneous, 2 times daily before meals  . insulin regular (NOVOLIN R) 10 Units, Subcutaneous, Daily, 10 units at noon  . irbesartan (AVAPRO) 75 mg, Oral, Daily  . Lancet Devices (EASY TOUCH LANCING DEVICE) MISC Use to check diabetes 4 times daily  E 11.65  . latanoprost (XALATAN) 0.005 % ophthalmic solution 1 drop, Both Eyes, Daily at bedtime  . metFORMIN (GLUCOPHAGE) 1000 MG tablet TAKE 1 TABLET BY MOUTH TWICE DAILY WITH MEALS  . metoprolol succinate (TOPROL-XL) 50 mg, Oral, Daily, Take with or immediately following a meal.  . Multiple Vitamin (MULTIVITAMIN) tablet 1 tablet, Oral, Daily  . nystatin (NYSTATIN) powder Apply powder to affected area twice daily until rash has resolved  . omeprazole (PRILOSEC) 40 MG capsule Take 1 capsule by mouth once daily  . ondansetron (ZOFRAN ODT) 4 mg, Oral, Every 8 hours PRN  . oxyCODONE-acetaminophen (PERCOCET) 7.5-325 MG tablet 1 tablet, Oral, 4 times daily PRN  . ticagrelor (BRILINTA) 90 mg, Oral, 2 times daily  . tiZANidine (ZANAFLEX) 4 mg, Oral, 3 times daily  . vitamin B-12 (CYANOCOBALAMIN) 1,000 mcg, Oral, Daily   Radiology:   CXR 10/08/2019:  The heart size and mediastinal contours are within normal limits. Both lungs are clear. The visualized skeletal structures are unremarkable. Cardiac  Studies:   Echocardiogram 10/09/2019:   1. Left ventricular ejection fraction, by visual estimation, is 50 to 55%. The left ventricle has low normal function. There is mildly increased left ventricular hypertrophy. Basal to mid inferolateral hypokinesis  2. The average left ventricular global longitudinal strain is -14.9 %.  3. Global right ventricle has normal systolic function.The right  ventricular size is normal. No increase in right ventricular wall thickness.  Left heart catheterization 10/09/2019: Hyperdynamic LV, EF 65 to 70%, EDP normal. RCA is a dominant vessel. Ulcerated tandem 80% followed by a 99% stenosis. SP 2.5 x 38 and a 2.5 x 8 mm resolute Onyx DES stents implanted, second stent used for distal edge dissection. Stenosis reduced to 0% with TIMI-3 to TIMI-3 flow. Circumflex coronary artery is smooth with minimal disease. Gives origin to large OM1 and OM 2. Tortuous. LAD is diffusely diseased from the mid to distal segment. There is a hazy 70 to 80% stenosis in the mid LAD. Following which distal LAD had a 40% diffuse disease. Mild calcification is evident. SP 2.24 x 15 mm resolute Onyx DES postdilated with 2.25 x 8 mm [place] at 22 atmospheric pressure with 0% residual stenosis. TIMI-3 to TIMI-3 flow.  Assessment     ICD-10-CM   1. Coronary artery disease involving native coronary artery of native heart with other form of angina pectoris ([place])  I25.118   2. Research study patient  Z00.6   3. Mixed hyperlipidemia  E78.2 ezetimibe (ZETIA) 10 MG tablet    Lipid Panel With LDL/HDL Ratio    Lipid Panel With LDL/HDL Ratio  4. Essential hypertension  I10     EKG 10/10/2019: normal EKG, normal sinus rhythm, Compared to 10/09/2019, inferior T wave inversion no longer present..  Recommendations:   Meds ordered this encounter  Medications  . ezetimibe (ZETIA) 10 MG tablet    Sig: Take 1 tablet (10 mg total) by mouth daily after supper.    Dispense:  90 tablet    Refill:  3    Patient enrolled in "The AEGIS-II Study": A Phase 3, Multicenter, Double-blind, Randomized, Placebo-controlled, Parallel-group Study to Investigate the Efficacy and Safety of CSL112 in Subjects with Acute Coronary Syndrome. Participation in this study will last approximately 12 months inclusive of study follow-up. Enrollment 10/10/2019.  SCARLETTROSE ELLINGBOE  is a 59 y.o. AAF  with  uncontrolled diabetes mellitus, hyperlipidemia, prior cigarette smoking quit in 2013, smokes marijuana occasionally, CAD  Presenting with NSTEMI, S/P stenting to mid right and also to the distal LAD by implantation of DES on 10/09/2019.  She has not had any further chest pain but continues to have severe pain in her neck and also her arms constantly which is clearly radicular pain.   She is presently doing well and has not had recurrence of angina pectoris, from cardiac standpoint although it has only been 3 months, if her neck pain continues to be a major issue, I do not have a problem holding Brilinta and aspirin for her to get spinal injection for 5 to 6 days.  We again discussed regarding continued weight loss, diabetes is improving but she has not had a recent A1c as prior office visit was a virtual visit.  LDL is slightly elevated more than what I would want at <70 mg.  I will add Zetia 10 mg p.o. daily for diet.  I will see her back in 6 months.  Adrian Prows, MD, [facility] 01/19/2020, 3:32 PM [facility] Pager: [phone] Office:  [phone] If no answer Cell [phone]

## 2020-01-22 ENCOUNTER — Encounter (HOSPITAL_COMMUNITY): Payer: BC Managed Care – PPO

## 2020-01-22 ENCOUNTER — Ambulatory Visit (HOSPITAL_COMMUNITY): Payer: BC Managed Care – PPO

## 2020-01-24 ENCOUNTER — Encounter (HOSPITAL_COMMUNITY): Payer: BC Managed Care – PPO

## 2020-01-24 ENCOUNTER — Ambulatory Visit (HOSPITAL_COMMUNITY): Payer: BC Managed Care – PPO

## 2020-01-26 ENCOUNTER — Encounter (HOSPITAL_COMMUNITY): Payer: Self-pay

## 2020-01-26 ENCOUNTER — Encounter (HOSPITAL_COMMUNITY): Payer: BC Managed Care – PPO

## 2020-01-26 ENCOUNTER — Ambulatory Visit (HOSPITAL_COMMUNITY): Payer: BC Managed Care – PPO

## 2020-01-26 DIAGNOSIS — Z955 Presence of coronary angioplasty implant and graft: Secondary | ICD-10-CM

## 2020-01-26 DIAGNOSIS — I214 Non-ST elevation (NSTEMI) myocardial infarction: Secondary | ICD-10-CM

## 2020-01-26 NOTE — Progress Notes (Signed)
Discharge Progress Report  Patient Details  Name: [name] MRN: [id number] Date of Birth: 07-06-1961 Referring Provider:     CARDIAC REHAB PHASE II ORIENTATION from 12/05/2019 in [facility]  Referring Provider  Adrian Prows, MD       Number of Visits: 1 incomplete session  Reason for Discharge:  Early Exit:  Insurance  Smoking History:  Social History   Tobacco Use  Smoking Status Former Smoker  . Quit date: 05/09/2012  . Years since quitting: 7.7  Smokeless Tobacco Never Used    Diagnosis:  NSTEMI (non-ST elevated myocardial infarction) ([place])  Status post coronary artery stent placement  ADL UCSD:   Initial Exercise Prescription: Initial Exercise Prescription - 12/06/19 0800      Date of Initial Exercise RX and Referring Provider   Date  12/05/19    Referring Provider  Adrian Prows, MD      Track   Minutes  30      Prescription Details   Frequency (times per week)  4-5    Duration  Progress to 30 minutes of continuous aerobic without signs/symptoms of physical distress      Intensity   THRR 40-80% of Max Heartrate  64-129    Ratings of Perceived Exertion  11-13    Perceived Dyspnea  0-4      Progression   Progression  Continue to progress workloads to maintain intensity without signs/symptoms of physical distress.      Resistance Training   Training Prescription  Yes    Reps  10-15       Discharge Exercise Prescription (Final Exercise Prescription Changes):   Functional Capacity: 6 Minute Walk    Row Name 12/05/19 1513         6 Minute Walk   Phase  Initial     Distance  496 feet     Walk Time  5.45 minutes     # of Rest Breaks  1     MPH  1.03     METS  1.61     RPE  13     Perceived Dyspnea   0     VO2 Peak  5.65     Symptoms  Yes (comment)     Comments  Patient c/o neck, shoulder, and right ankle pain, which is chronic for her. Patient rated her pain a 10/10 on the pain scale, and 1 seted rest break  taken for 33 seconds.     Resting HR  88 bpm     Resting BP  108/78     Resting Oxygen Saturation   99 %     Exercise Oxygen Saturation  during 6 min walk  100 %     Max Ex. HR  96 bpm     Max Ex. BP  120/72     2 Minute Post BP  122/68        Psychological, QOL, Others - Outcomes: PHQ 2/9: Depression screen [facility] 2/9 08/10/2019 08/16/2017 05/04/2017 06/19/2016 11/15/2012  Decreased Interest 0 0 0 0 0  Down, Depressed, Hopeless 0 0 0 0 0  PHQ - 2 Score 0 0 0 0 0  Some recent data might be hidden    Quality of Life: Quality of Life - 12/05/19 1600      Quality of Life   Select  Quality of Life      Quality of Life Scores   Health/Function Pre  23.82 %  Socioeconomic Pre  23.36 %    Psych/Spiritual Pre  26.57 %    Family Pre  25.5 %    GLOBAL Pre  24.53 %       Personal Goals: Goals established at orientation with interventions provided to work toward goal. Personal Goals and Risk Factors at Admission - 12/05/19 1440      Core Components/Risk Factors/Patient Goals on Admission    Weight Management  Yes;Obesity    Intervention  Weight Management/Obesity: Establish reasonable short term and long term weight goals.;Obesity: Provide education and appropriate resources to help participant work on and attain dietary goals.    Admit Weight  196 lb 6.9 oz (89.1 kg)    Expected Outcomes  Short Term: Continue to assess and modify interventions until short term weight is achieved;Weight Loss: Understanding of general recommendations for a balanced deficit meal plan, which promotes 1-2 lb weight loss per week and includes a negative energy balance of [phone] kcal/d;Long Term: Adherence to nutrition and physical activity/exercise program aimed toward attainment of established weight goal    Diabetes  Yes    Intervention  Provide education about signs/symptoms and action to take for hypo/hyperglycemia.;Provide education about proper nutrition, including hydration, and aerobic/resistive  exercise prescription along with prescribed medications to achieve blood glucose in normal ranges: Fasting glucose 65-99 mg/dL    Expected Outcomes  Short Term: Participant verbalizes understanding of the signs/symptoms and immediate care of hyper/hypoglycemia, proper foot care and importance of medication, aerobic/resistive exercise and nutrition plan for blood glucose control.;Long Term: Attainment of HbA1C < 7%.    Hypertension  Yes    Intervention  Provide education on lifestyle modifcations including regular physical activity/exercise, weight management, moderate sodium restriction and increased consumption of fresh fruit, vegetables, and low fat dairy, alcohol moderation, and smoking cessation.;Monitor prescription use compliance.    Expected Outcomes  Short Term: Continued assessment and intervention until BP is < 140/72m HG in hypertensive participants. < 130/85mHG in hypertensive participants with diabetes, heart failure or chronic kidney disease.;Long Term: Maintenance of blood pressure at goal levels.    Lipids  Yes    Intervention  Provide education and support for participant on nutrition & aerobic/resistive exercise along with prescribed medications to achieve LDL '70mg'$ , HDL >'40mg'$ .    Expected Outcomes  Short Term: Participant states understanding of desired cholesterol values and is compliant with medications prescribed. Participant is following exercise prescription and nutrition guidelines.;Long Term: Cholesterol controlled with medications as prescribed, with individualized exercise RX and with personalized nutrition plan. Value goals: LDL < '70mg'$ , HDL > 40 mg.        Personal Goals Discharge: Goals and Risk Factor Review    Row Name 01/09/20 1053             Core Components/Risk Factors/Patient Goals Review   Personal Goals Review  Weight Management/Obesity;Lipids;Diabetes;Hypertension       Review  Patient with multiple CAD risk factors. She is independently exercising at  home.       Expected Outcomes  Patient will continue to participate in CR to reduce CAD risk factors and manager her health.          Exercise Goals and Review: Exercise Goals    Row Name 12/05/19 1440             Exercise Goals   Increase Physical Activity  Yes       Intervention  Provide advice, education, support and counseling about physical activity/exercise needs.;Develop an individualized exercise prescription  for aerobic and resistive training based on initial evaluation findings, risk stratification, comorbidities and participant's personal goals.       Expected Outcomes  Short Term: Attend rehab on a regular basis to increase amount of physical activity.;Long Term: Exercising regularly at least 3-5 days a week.;Long Term: Add in home exercise to make exercise part of routine and to increase amount of physical activity.       Increase Strength and Stamina  Yes       Intervention  Provide advice, education, support and counseling about physical activity/exercise needs.;Develop an individualized exercise prescription for aerobic and resistive training based on initial evaluation findings, risk stratification, comorbidities and participant's personal goals.       Expected Outcomes  Short Term: Increase workloads from initial exercise prescription for resistance, speed, and METs.;Short Term: Perform resistance training exercises routinely during rehab and add in resistance training at home;Long Term: Improve cardiorespiratory fitness, muscular endurance and strength as measured by increased METs and functional capacity (6MWT)       Able to understand and use rate of perceived exertion (RPE) scale  Yes       Intervention  Provide education and explanation on how to use RPE scale       Expected Outcomes  Short Term: Able to use RPE daily in rehab to express subjective intensity level;Long Term:  Able to use RPE to guide intensity level when exercising independently       Knowledge and  understanding of Target Heart Rate Range (THRR)  Yes       Intervention  Provide education and explanation of THRR including how the numbers were predicted and where they are located for reference       Expected Outcomes  Short Term: Able to state/look up THRR;Long Term: Able to use THRR to govern intensity when exercising independently;Short Term: Able to use daily as guideline for intensity in rehab       Able to check pulse independently  Yes       Intervention  Provide education and demonstration on how to check pulse in carotid and radial arteries.;Review the importance of being able to check your own pulse for safety during independent exercise       Expected Outcomes  Long Term: Able to check pulse independently and accurately;Short Term: Able to explain why pulse checking is important during independent exercise       Understanding of Exercise Prescription  Yes       Intervention  Provide education, explanation, and written materials on patient's individual exercise prescription       Expected Outcomes  Short Term: Able to explain program exercise prescription;Long Term: Able to explain home exercise prescription to exercise independently          Exercise Goals Re-Evaluation: Exercise Goals Re-Evaluation    Row Name 01/08/20 1122             Exercise Goal Re-Evaluation   Comments  Patient will be contacted to begin participation in the onsite cardiac rehab program after temporary closure due to COVID-19 pandemic. Patient has been encouraged to exercise at home but is limited by chronic pain.       Expected Outcomes  Patient will be transitioned to the onsite cardiac rehab program if she's able to participate.          Nutrition & Weight - Outcomes: Pre Biometrics - 12/05/19 1445      Pre Biometrics   Height  5' 4.25" (1.632 m)  Weight  196 lb 6.9 oz (89.1 kg)    Waist Circumference  44 inches    Hip Circumference  48.75 inches    Waist to Hip Ratio  0.9 %    BMI  (Calculated)  33.45    Triceps Skinfold  13 mm    % Body Fat  40.5 %    Grip Strength  13 kg    Flexibility  --   Not attempted due to neck and shoulder pain.   Single Leg Stand  7.87 seconds        Nutrition:   Nutrition Discharge:   Education Questionnaire Score: Knowledge Questionnaire Score - 12/05/19 1600      Knowledge Questionnaire Score   Pre Score  21/24      Successful telephone encounter to Ms. Hamill to follow up on her CR enrollment. Ms. Laverne states she spoke with her insurance company who confirmed CR was not a coverable service until she met her max out of pocket amount. Ms. Barnhart states CR is unaffordable for her and request discharge from program. She is not interested in participating in the [facility] program which would be of no cost to her.

## 2020-01-27 ENCOUNTER — Ambulatory Visit (HOSPITAL_COMMUNITY)
Admission: EM | Admit: 2020-01-27 | Discharge: 2020-01-27 | Disposition: A | Discharge status: Home / Self Care | Payer: BC Managed Care – PPO | Attending: Physician Assistant | Admitting: Physician Assistant

## 2020-01-27 ENCOUNTER — Other Ambulatory Visit: Payer: Self-pay

## 2020-01-27 ENCOUNTER — Encounter (HOSPITAL_COMMUNITY): Payer: Self-pay | Admitting: *Deleted

## 2020-01-27 DIAGNOSIS — H6121 Impacted cerumen, right ear: Secondary | ICD-10-CM

## 2020-01-27 DIAGNOSIS — H6503 Acute serous otitis media, bilateral: Secondary | ICD-10-CM | POA: Diagnosis not present

## 2020-01-27 DIAGNOSIS — H6093 Unspecified otitis externa, bilateral: Secondary | ICD-10-CM

## 2020-01-27 HISTORY — DX: Acute myocardial infarction, unspecified: I21.9

## 2020-01-27 MED ORDER — HYDROCORTISONE-ACETIC ACID 1-2 % OT SOLN: 3.0000 [drp] | Freq: Two times a day (BID) | OTIC | 0 refills | Status: AC

## 2020-01-27 MED ORDER — FLUTICASONE PROPIONATE 50 MCG/ACT NA SUSP: 1.0000 | Freq: Every day | NASAL | 0 refills | Status: DC

## 2020-01-27 MED ORDER — CETIRIZINE HCL 10 MG PO TABS: 10.0000 mg | ORAL_TABLET | Freq: Every day | ORAL | 0 refills | Status: DC

## 2020-01-27 NOTE — Discharge Instructions (Signed)
I want you to begin flonase daily and take zyrtec daily  Place 3 drops in each ear 2 times a day for 1 week  Please return or follow up with your primary care if not improving in 2-3 weeks

## 2020-01-27 NOTE — ED Triage Notes (Signed)
C/O bilat ear pain, R>L, with some hearing loss in right ear x approx 3 days.  Has placed some H2O2 in right ear.

## 2020-01-27 NOTE — ED Provider Notes (Signed)
[facility]    CSN: [id number] Arrival date & time: 01/27/20  1347      History   Chief Complaint Chief Complaint  Patient presents with  . Otalgia    HPI [name] is a 59 y.o. female.   Patient presents with bilateral ear pain and diminished Gamble.  She reports the pain in her right ear is a little bit worse than her left.  Pain has been present for at least 3 days.  Pain is described as sharp and throbbing at times primarily in the right ear.  Patient also reports some diminished Gamble in the right ear.  She denies recent nasal congestion, runny nose, headache or other upper respiratory symptoms.  She denies a history of issues with her ears.  She has tried hydrogen peroxide in her years without improvement.     Past Medical History:  Diagnosis Date  . Arthritis   . Campylobacter enteritis May 2012   [facility] admission  . Carpal tunnel syndrome, bilateral   . Diabetes mellitus   . Gastritis with bleeding due to alcohol 2010   [facility] admission  . GERD (gastroesophageal reflux disease)   . Hyperlipidemia   . Hypertension    no meds  . Menopause   . Myocardial infarction ([place])   . Neck pain   . Wears glasses     Patient Active Problem List   Diagnosis Date Noted  . Traumatic hematoma of female breast 11/29/2019  . Dizziness 11/29/2019  . Hospital discharge follow-up 10/23/2019  . NSTEMI (non-ST elevated myocardial infarction) ([place]) 10/10/2019  . CAD (coronary artery disease) 10/10/2019  . Chest pain 10/09/2019  . Elevated troponin 10/09/2019  . Uncontrolled type 2 diabetes mellitus with hyperglycemia ([place]) 10/09/2019  . Obesity (BMI 30.0-34.9) 10/09/2019  . Cervical spondylitis with radiculitis ([place]) 08/29/2019  . Acute bursitis of left shoulder 01/30/2019  . Chronic midline posterior neck pain 06/28/2018  . Urgency incontinence 06/28/2018  . Degenerative joint disease of knee, left 04/26/2018  . Carpal tunnel syndrome on both sides  03/29/2018  . Anemia 01/02/2018  . Class 1 obesity with serious comorbidity and body mass index (BMI) of 32.0 to 32.9 in adult 07/23/2017  . Left hip pain 05/04/2017  . Carpal tunnel syndrome on right 12/27/2016  . Polyarthralgia 11/09/2016  . Uncontrolled type 2 diabetes mellitus with retinopathy, with long-term current use of insulin ([place]) 07/07/2016  . Visit for preventive health examination 06/21/2016  . Increased urinary frequency 06/21/2016  . Chronic meniscal tear of knee 08/19/2015  . Left knee pain 06/19/2015  . Candidiasis of skin 06/18/2015  . Genital herpes 07/31/2014  . Ulnar nerve compression 09/21/2013  . Noncompliance with diet and medication regimen 11/15/2012  . Boil, axilla 08/14/2012  . Arthritis   . Screening for breast cancer 08/04/2011  . Screening for colon cancer 08/04/2011  . HIDRADENITIS SUPPURATIVA 04/11/2007  . GERD 11/17/2006  . ENDOMETRIAL POLYP 11/17/2006  . LOW BACK PAIN 11/17/2006  . FIBROIDS, UTERUS 11/16/2006  . Hyperlipidemia associated with type 2 diabetes mellitus ([place]) 11/16/2006  . Essential hypertension 11/16/2006    Past Surgical History:  Procedure Laterality Date  . CARPAL TUNNEL RELEASE Left 05/14/2014   Procedure: LEFT ULNAR NEUROPLASTY AT ELBOW AND ENDOSCOPIC CARPAL TUNNEL RELEASE;  Surgeon: Jolyn Nap, MD;  Location: [facility];  Service: Orthopedics;  Laterality: Left;  . CORONARY STENT INTERVENTION N/A 10/09/2019   Procedure: CORONARY STENT INTERVENTION;  Surgeon: Adrian Prows, MD;  Location: [facility]  INVASIVE CV LAB;  Service: Cardiovascular;  Laterality: N/A;  . CYST EXCISION  1995   under arms  . endometrial biopsy  2004   benign  . FOOT OSTEOTOMY  2010   right-spurs  . LEFT HEART CATH AND CORONARY ANGIOGRAPHY N/A 10/09/2019   Procedure: LEFT HEART CATH AND CORONARY ANGIOGRAPHY;  Surgeon: Adrian Prows, MD;  Location: [facility] CV LAB;  Service: Cardiovascular;  Laterality: N/A;  . TONSILLECTOMY    . ULNAR NERVE  TRANSPOSITION Left 05/14/2014   Procedure: ULNAR NERVE DECOMPRESSION/TRANSPOSITION;  Surgeon: Jolyn Nap, MD;  Location: [facility];  Service: Orthopedics;  Laterality: Left;    OB History   No obstetric history on file.      Home Medications    Prior to Admission medications   Medication Sig Start Date End Date Taking? Authorizing Provider  aspirin 81 MG chewable tablet Chew 1 tablet (81 mg total) by mouth daily. 10/10/19  Yes Black, Lezlie Octave, NP  atorvastatin (LIPITOR) 40 MG tablet Take 1 tablet by mouth once daily 01/09/20  Yes Crecencio Mc, MD  ezetimibe (ZETIA) 10 MG tablet Take 1 tablet (10 mg total) by mouth daily after supper. 01/19/20 01/13/21 Yes Adrian Prows, MD  gabapentin (NEURONTIN) 300 MG capsule Take 1 capsule (300 mg total) by mouth 3 (three) times daily. 08/29/19  Yes Marcial Pacas, MD  insulin NPH Human (NOVOLIN N RELION) 100 UNIT/ML injection Inject 0.2-0.55 mLs (20-55 Units total) into the skin 2 (two) times daily before a meal. Patient taking differently: Inject into the skin 2 (two) times daily before a meal. 15 units in the morning and 40 units at night 10/10/19  Yes Black, Lezlie Octave, NP  insulin regular (NOVOLIN R) 100 units/mL injection Inject 10 Units into the skin daily. 10 units at noon   Yes [provider]  irbesartan (AVAPRO) 75 MG tablet Take 1 tablet (75 mg total) by mouth daily. 11/07/19  Yes Adrian Prows, MD  latanoprost (XALATAN) 0.005 % ophthalmic solution Place 1 drop into both eyes at bedtime.  12/02/18  Yes [provider]  metFORMIN (GLUCOPHAGE) 1000 MG tablet TAKE 1 TABLET BY MOUTH TWICE DAILY WITH MEALS 01/09/20  Yes Crecencio Mc, MD  metoprolol succinate (TOPROL-XL) 50 MG 24 hr tablet Take 1 tablet (50 mg total) by mouth daily. Take with or immediately following a meal. 11/07/19  Yes Adrian Prows, MD  Multiple Vitamin (MULTIVITAMIN) tablet Take 1 tablet by mouth daily.   Yes [provider]  nystatin (NYSTATIN)  powder Apply powder to affected area twice daily until rash has resolved 11/27/19  Yes Crecencio Mc, MD  omeprazole (PRILOSEC) 40 MG capsule Take 1 capsule by mouth once daily 05/23/19  Yes Crecencio Mc, MD  oxyCODONE-acetaminophen (PERCOCET) 7.5-325 MG tablet Take 1 tablet by mouth 4 (four) times daily as needed. 10/07/19  Yes [provider]  ticagrelor (BRILINTA) 90 MG TABS tablet Take 1 tablet (90 mg total) by mouth 2 (two) times daily. 11/08/19  Yes Adrian Prows, MD  tiZANidine (ZANAFLEX) 4 MG tablet Take 4 mg by mouth 3 (three) times daily. 10/21/19  Yes [provider]  vitamin B-12 (CYANOCOBALAMIN) 1000 MCG tablet Take 1,000 mcg by mouth daily.   Yes [provider]  acetic acid-hydrocortisone (VOSOL-HC) OTIC solution Place 3 drops into both ears 2 (two) times daily for 7 days. 01/27/20 02/03/20  Kaelene Elliston, Marguerita Beards, PA-C  cetirizine (ZYRTEC ALLERGY) 10 MG tablet Take 1 tablet (10 mg  total) by mouth daily. 01/27/20 02/26/20  Waynette Towers, Marguerita Beards, PA-C  Continuous Blood Gluc Sensor (FREESTYLE LIBRE 14 DAY SENSOR) MISC 1 each by Does not apply route every 14 (fourteen) days. Change every 2 weeks Patient not taking: Reported on 01/19/2020 11/01/17   Philemon Kingdom, MD  fluticasone [facility]) 50 MCG/ACT nasal spray Place 1 spray into both nostrils daily. 01/27/20 02/26/20  Kinda Pottle, Marguerita Beards, PA-C  glucose blood (BAYER CONTOUR TEST) test strip TEST BLOOD SUGARS 3 TIMES DAILY 08/12/16   Philemon Kingdom, MD  Lancet Devices (EASY TOUCH LANCING DEVICE) MISC Use to check diabetes 4 times daily  E 11.65 08/12/16   Philemon Kingdom, MD  ondansetron (ZOFRAN ODT) 4 MG disintegrating tablet Take 1 tablet (4 mg total) by mouth every 8 (eight) hours as needed for nausea or vomiting. 12/14/19   Crecencio Mc, MD    Family History Family History  Problem Relation Age of Onset  . Hypertension Mother   . Diabetes Mother   . Heart attack Mother   . Stroke Father   . Diabetes Father   . Diabetes  Maternal Grandmother   . Hypertension Maternal Grandmother   . Heart attack Sister     Social History Social History   Tobacco Use  . Smoking status: Former Smoker    Quit date: 05/09/2012    Years since quitting: 7.7  . Smokeless tobacco: Never Used  Substance Use Topics  . Alcohol use: Not Currently  . Drug use: Yes    Types: Marijuana    Comment: sometimes     Allergies   Oxycodone-acetaminophen and Penicillins   Review of Systems Review of Systems  Constitutional: Negative for chills and fever.  HENT: Positive for ear pain and mouth sores. Negative for ear discharge, sinus pressure, sinus pain, sneezing, sore throat and tinnitus.   Eyes: Negative for pain and visual disturbance.  Respiratory: Negative for cough and shortness of breath.   Cardiovascular: Negative for chest pain and palpitations.  Musculoskeletal: Negative for arthralgias, back pain and joint swelling.  Skin: Negative for color change and rash.  Neurological: Negative for seizures, syncope and headaches.  All other systems reviewed and are negative.    Physical Exam Triage Vital Signs ED Triage Vitals  Enc Vitals Group     BP 01/27/20 1432 122/73     Pulse Rate 01/27/20 1432 90     Resp 01/27/20 1432 16     Temp 01/27/20 1432 98.3 F (36.8 C)     Temp Source 01/27/20 1432 Oral     SpO2 01/27/20 1432 98 %     Weight --      Height --      Head Circumference --      Peak Flow --      Pain Score 01/27/20 1433 8     Pain Loc --      Pain Edu? --      Excl. in [place]? --    No data found.  Updated Vital Signs BP 122/73   Pulse 90   Temp 98.3 F (36.8 C) (Oral)   Resp 16   SpO2 98%   Visual Acuity Right Eye Distance:   Left Eye Distance:   Bilateral Distance:    Right Eye Near:   Left Eye Near:    Bilateral Near:     Physical Exam Vitals and nursing note reviewed.  Constitutional:      General: She is not in acute distress.    Appearance: Normal  appearance. She is  well-developed. She is not ill-appearing.  HENT:     Head: Normocephalic and atraumatic.     Ears:     Comments: Right ear canal with significant cerumen partially removed with curette remainder removed with irrigation.  There is some irritation and inflammation of the ear canal but no erythema suggestive of acute infection.  Right tympanic membrane pearly gray with evidence of serous fluid.  Left ear canal with mild cerumen which was removed with curette.  Also with inflammation and irritation.  Left tympanic membrane pearly gray with serous fluid present in middle ear.  Gamble grossly intact equal bilaterally    Nose: Nose normal. No congestion or rhinorrhea.     Mouth/Throat:     Mouth: Mucous membranes are moist.     Pharynx: Oropharynx is clear.  Eyes:     Extraocular Movements: Extraocular movements intact.     Conjunctiva/sclera: Conjunctivae normal.     Pupils: Pupils are equal, round, and reactive to light.  Cardiovascular:     Rate and Rhythm: Normal rate.  Pulmonary:     Effort: Pulmonary effort is normal. No respiratory distress.  Skin:    General: Skin is warm and dry.     Findings: No rash.  Neurological:     General: No focal deficit present.     Mental Status: She is alert and oriented to person, place, and time.  Psychiatric:        Mood and Affect: Mood normal.        Behavior: Behavior normal.        Thought Content: Thought content normal.        Judgment: Judgment normal.      UC Treatments / Results  Labs (all labs ordered are listed, but only abnormal results are displayed) Labs Reviewed - No data to display  EKG   Radiology No results found.  Procedures Procedures (including critical care time)  Medications Ordered in UC Medications - No data to display  Initial Impression / Assessment and Plan / UC Course  I have reviewed the triage vital signs and the nursing notes.  Pertinent labs & imaging results that were available during my care  of the patient were reviewed by me and considered in my medical decision making (see chart for details).     #Bilateral serous otitis media #Inflammation in the canals.   Patient is a 59 year old female presenting with bilateral ear pain with some diminished Gamble.  Believe Gamble was diminished secondary to combination of cerumen blockage as well as serous otitis media.  Believe ear pressure is also causing her pain.  There is no acute evidence of suppurative otitis media at this time.  Cerumen cleared in clinic. -Recommended daily regimen of Flonase and Zyrtec. -Acetic acid-hydrocortisone eardrops prescribed right 70 treatment 3 drops each time.  Final Clinical Impressions(s) / UC Diagnoses   Final diagnoses:  Bilateral acute serous otitis media, recurrence not specified  Inflammation of both ear canals     Discharge Instructions     I want you to begin flonase daily and take zyrtec daily  Place 3 drops in each ear 2 times a day for 1 week  Please return or follow up with your primary care if not improving in 2-3 weeks     ED Prescriptions    Medication Sig Dispense Auth. Provider   fluticasone (FLONASE) 50 MCG/ACT nasal spray Place 1 spray into both nostrils daily. 1 g Kamonte Mcmichen, Marguerita Beards, PA-C  cetirizine (ZYRTEC ALLERGY) 10 MG tablet Take 1 tablet (10 mg total) by mouth daily. 30 tablet Laine Giovanetti, Marguerita Beards, PA-C   acetic acid-hydrocortisone (VOSOL-HC) OTIC solution Place 3 drops into both ears 2 (two) times daily for 7 days. 2.1 mL Yonah Tangeman, Marguerita Beards, PA-C     PDMP not reviewed this encounter.   Purnell Shoemaker, PA-C 01/27/20 2133

## 2020-01-29 ENCOUNTER — Encounter (HOSPITAL_COMMUNITY): Payer: BC Managed Care – PPO

## 2020-01-29 ENCOUNTER — Ambulatory Visit (HOSPITAL_COMMUNITY): Payer: BC Managed Care – PPO

## 2020-01-31 ENCOUNTER — Encounter (HOSPITAL_COMMUNITY): Payer: BC Managed Care – PPO

## 2020-01-31 ENCOUNTER — Ambulatory Visit (HOSPITAL_COMMUNITY): Payer: BC Managed Care – PPO

## 2020-02-02 ENCOUNTER — Ambulatory Visit (HOSPITAL_COMMUNITY): Payer: BC Managed Care – PPO

## 2020-02-02 ENCOUNTER — Encounter (HOSPITAL_COMMUNITY): Payer: BC Managed Care – PPO

## 2020-02-04 ENCOUNTER — Other Ambulatory Visit: Payer: Self-pay | Admitting: Cardiology

## 2020-02-05 ENCOUNTER — Encounter (HOSPITAL_COMMUNITY): Payer: BC Managed Care – PPO

## 2020-02-06 ENCOUNTER — Telehealth: Payer: Self-pay | Admitting: Internal Medicine

## 2020-02-06 ENCOUNTER — Other Ambulatory Visit: Payer: Self-pay

## 2020-02-06 ENCOUNTER — Ambulatory Visit (HOSPITAL_COMMUNITY)
Admission: EM | Admit: 2020-02-06 | Discharge: 2020-02-06 | Disposition: A | Discharge status: Home / Self Care | Payer: BC Managed Care – PPO

## 2020-02-06 ENCOUNTER — Encounter (HOSPITAL_COMMUNITY): Payer: Self-pay

## 2020-02-06 DIAGNOSIS — H9201 Otalgia, right ear: Secondary | ICD-10-CM | POA: Diagnosis not present

## 2020-02-06 MED ORDER — DOXYCYCLINE HYCLATE 100 MG PO CAPS: 100.0000 mg | ORAL_CAPSULE | Freq: Two times a day (BID) | ORAL | 0 refills | Status: AC

## 2020-02-06 NOTE — Telephone Encounter (Signed)
FYI

## 2020-02-06 NOTE — Discharge Instructions (Addendum)
Take the doxycycline twice a day for 7 days drink plenty of water with this medication  Please follow-up with your primary care for reevaluation of your ear in 1 to 2 weeks.  Continue the Flonase and Zyrtec

## 2020-02-06 NOTE — ED Triage Notes (Signed)
Pt is here with the same symptoms as she had on 01/27/2020. She had a blood clot come out of her ear, she brought it with her in a bag for the provider.

## 2020-02-06 NOTE — Telephone Encounter (Signed)
Pt went to Urgent Care on 2/27/21for her ears. This morning she put her drops in and she saw after putting in cotton that she had some blood on the cotton. She wanted to know if she should go back to urgent care or try to get in with Dr. Derrel Nip. I let her know that Dr. Derrel Nip didn't have any available appts and she said that she is going back to the UC she went to on the 01/27/20 after she gets off of work.

## 2020-02-06 NOTE — ED Provider Notes (Signed)
[place]    CSN: [id number] Arrival date & time: 02/06/20  1405      History   Chief Complaint Chief Complaint  Patient presents with  . Ear Problem    Right    HPI [name] is a 59 y.o. female.   Patient reports urgent care today for complaint of bleeding in her right ear and diminished hearing.  She was seen on 01/28/2020 for bilateral decreased hearing and ear pain and diagnosed with bilateral ears otitis media and had cerumen removed.  She was placed on Flonase and Zyrtec along with Cortisporin drops.  She reports general improvement throughout the duration of her treatment and noted yesterday that she had full hearing in her right ear but had a" blood clot come out of her right ear today" and has since had diminished hearing in the right ear.  She endorses some throbbing-like pain at times in her right ear.  Denies fever and chills or drainage.     Past Medical History:  Diagnosis Date  . Arthritis   . Campylobacter enteritis May 2012   [facility] admission  . Carpal tunnel syndrome, bilateral   . Diabetes mellitus   . Gastritis with bleeding due to alcohol 2010   [facility] admission  . GERD (gastroesophageal reflux disease)   . Hyperlipidemia   . Hypertension    no meds  . Menopause   . Myocardial infarction ([place])   . Neck pain   . Wears glasses     Patient Active Problem List   Diagnosis Date Noted  . Traumatic hematoma of female breast 11/29/2019  . Dizziness 11/29/2019  . Hospital discharge follow-up 10/23/2019  . NSTEMI (non-ST elevated myocardial infarction) ([place]) 10/10/2019  . CAD (coronary artery disease) 10/10/2019  . Chest pain 10/09/2019  . Elevated troponin 10/09/2019  . Uncontrolled type 2 diabetes mellitus with hyperglycemia ([place]) 10/09/2019  . Obesity (BMI 30.0-34.9) 10/09/2019  . Cervical spondylitis with radiculitis ([place]) 08/29/2019  . Acute bursitis of left shoulder 01/30/2019  . Chronic midline posterior neck pain 06/28/2018  .  Urgency incontinence 06/28/2018  . Degenerative joint disease of knee, left 04/26/2018  . Carpal tunnel syndrome on both sides 03/29/2018  . Anemia 01/02/2018  . Class 1 obesity with serious comorbidity and body mass index (BMI) of 32.0 to 32.9 in adult 07/23/2017  . Left hip pain 05/04/2017  . Carpal tunnel syndrome on right 12/27/2016  . Polyarthralgia 11/09/2016  . Uncontrolled type 2 diabetes mellitus with retinopathy, with long-term current use of insulin ([place]) 07/07/2016  . Visit for preventive health examination 06/21/2016  . Increased urinary frequency 06/21/2016  . Chronic meniscal tear of knee 08/19/2015  . Left knee pain 06/19/2015  . Candidiasis of skin 06/18/2015  . Genital herpes 07/31/2014  . Ulnar nerve compression 09/21/2013  . Noncompliance with diet and medication regimen 11/15/2012  . Boil, axilla 08/14/2012  . Arthritis   . Screening for breast cancer 08/04/2011  . Screening for colon cancer 08/04/2011  . HIDRADENITIS SUPPURATIVA 04/11/2007  . GERD 11/17/2006  . ENDOMETRIAL POLYP 11/17/2006  . LOW BACK PAIN 11/17/2006  . FIBROIDS, UTERUS 11/16/2006  . Hyperlipidemia associated with type 2 diabetes mellitus ([place]) 11/16/2006  . Essential hypertension 11/16/2006    Past Surgical History:  Procedure Laterality Date  . CARPAL TUNNEL RELEASE Left 05/14/2014   Procedure: LEFT ULNAR NEUROPLASTY AT ELBOW AND ENDOSCOPIC CARPAL TUNNEL RELEASE;  Surgeon: Jolyn Nap, MD;  Location: [facility];  Service: Orthopedics;  Laterality: Left;  . CORONARY STENT INTERVENTION N/A 10/09/2019   Procedure: CORONARY STENT INTERVENTION;  Surgeon: Adrian Prows, MD;  Location: [facility];  Service: Cardiovascular;  Laterality: N/A;  . CYST EXCISION  1995   under arms  . endometrial biopsy  2004   benign  . FOOT OSTEOTOMY  2010   right-spurs  . LEFT HEART CATH AND CORONARY ANGIOGRAPHY N/A 10/09/2019   Procedure: LEFT HEART CATH AND CORONARY ANGIOGRAPHY;   Surgeon: Adrian Prows, MD;  Location: [place];  Service: Cardiovascular;  Laterality: N/A;  . TONSILLECTOMY    . ULNAR NERVE TRANSPOSITION Left 05/14/2014   Procedure: ULNAR NERVE DECOMPRESSION/TRANSPOSITION;  Surgeon: Jolyn Nap, MD;  Location: [facility];  Service: Orthopedics;  Laterality: Left;    OB History   No obstetric history on file.      Home Medications    Prior to Admission medications   Medication Sig Start Date End Date Taking? Authorizing Provider  aspirin 81 MG chewable tablet Chew 1 tablet (81 mg total) by mouth daily. 10/10/19   Radene Gunning, NP  atorvastatin (LIPITOR) 40 MG tablet Take 1 tablet by mouth once daily 01/09/20   Crecencio Mc, MD  BRILINTA 90 MG TABS tablet Take 1 tablet by mouth twice daily 02/05/20   Adrian Prows, MD  cetirizine (ZYRTEC ALLERGY) 10 MG tablet Take 1 tablet (10 mg total) by mouth daily. 01/27/20 02/26/20  Audre Cenci, Marguerita Beards, PA-C  Continuous Blood Gluc Sensor (FREESTYLE LIBRE 14 DAY SENSOR) MISC 1 each by Does not apply route every 14 (fourteen) days. Change every 2 weeks Patient not taking: Reported on 01/19/2020 11/01/17   Philemon Kingdom, MD  doxycycline (VIBRAMYCIN) 100 MG capsule Take 1 capsule (100 mg total) by mouth 2 (two) times daily for 7 days. 02/06/20 02/13/20  Darnell Stimson, Marguerita Beards, PA-C  ezetimibe (ZETIA) 10 MG tablet Take 1 tablet (10 mg total) by mouth daily after supper. 01/19/20 01/13/21  Adrian Prows, MD  fluticasone (FLONASE) 50 MCG/ACT nasal spray Place 1 spray into both nostrils daily. 01/27/20 02/26/20  Angenette Daily, Marguerita Beards, PA-C  gabapentin (NEURONTIN) 300 MG capsule Take 1 capsule (300 mg total) by mouth 3 (three) times daily. 08/29/19   Marcial Pacas, MD  glucose blood (BAYER CONTOUR TEST) test strip TEST BLOOD SUGARS 3 TIMES DAILY 08/12/16   Philemon Kingdom, MD  insulin NPH Human (NOVOLIN N RELION) 100 UNIT/ML injection Inject 0.2-0.55 mLs (20-55 Units total) into the skin 2 (two) times daily before a meal. Patient  taking differently: Inject into the skin 2 (two) times daily before a meal. 15 units in the morning and 40 units at night 10/10/19   Black, Lezlie Octave, NP  insulin regular (NOVOLIN R) 100 units/mL injection Inject 10 Units into the skin daily. 10 units at noon    [provider]  irbesartan (AVAPRO) 75 MG tablet Take 1 tablet (75 mg total) by mouth daily. 11/07/19   Adrian Prows, MD  Lancet Devices (EASY TOUCH LANCING DEVICE) MISC Use to check diabetes 4 times daily  E 11.65 08/12/16   Philemon Kingdom, MD  latanoprost (XALATAN) 0.005 % ophthalmic solution Place 1 drop into both eyes at bedtime.  12/02/18   [provider]  metFORMIN (GLUCOPHAGE) 1000 MG tablet TAKE 1 TABLET BY MOUTH TWICE DAILY WITH MEALS 01/09/20   Crecencio Mc, MD  metoprolol succinate (TOPROL-XL) 50 MG 24 hr tablet Take 1 tablet (50 mg total) by mouth daily. Take with  or immediately following a meal. 11/07/19   Adrian Prows, MD  Multiple Vitamin (MULTIVITAMIN) tablet Take 1 tablet by mouth daily.    [provider]  nystatin (NYSTATIN) powder Apply powder to affected area twice daily until rash has resolved 11/27/19   Crecencio Mc, MD  omeprazole (PRILOSEC) 40 MG capsule Take 1 capsule by mouth once daily 05/23/19   Crecencio Mc, MD  ondansetron (ZOFRAN ODT) 4 MG disintegrating tablet Take 1 tablet (4 mg total) by mouth every 8 (eight) hours as needed for nausea or vomiting. 12/14/19   Crecencio Mc, MD  oxyCODONE-acetaminophen (PERCOCET) 7.5-325 MG tablet Take 1 tablet by mouth 4 (four) times daily as needed. 10/07/19   [provider]  sulfamethoxazole-trimethoprim (BACTRIM DS) 800-160 MG tablet Take 1 tablet by mouth 2 (two) times daily. 01/23/20   [provider]  tiZANidine (ZANAFLEX) 2 MG tablet Take 2 mg by mouth 2 (two) times daily as needed. 01/23/20   [provider]  tiZANidine (ZANAFLEX) 4 MG tablet Take 4 mg by mouth 3 (three) times daily. 10/21/19   [provider]  vitamin B-12 (CYANOCOBALAMIN) 1000 MCG tablet Take 1,000 mcg by mouth daily.    [provider]    Family History Family History  Problem Relation Age of Onset  . Hypertension Mother   . Diabetes Mother   . Heart attack Mother   . Stroke Father   . Diabetes Father   . Diabetes Maternal Grandmother   . Hypertension Maternal Grandmother   . Heart attack Sister     Social History Social History   Tobacco Use  . Smoking status: Former Smoker    Quit date: 05/09/2012    Years since quitting: 7.7  . Smokeless tobacco: Never Used  Substance Use Topics  . Alcohol use: Not Currently  . Drug use: Yes    Types: Marijuana    Comment: sometimes     Allergies   Oxycodone-acetaminophen and Penicillins   Review of Systems Review of Systems  Constitutional: Negative for chills and fever.  HENT: Positive for ear discharge, ear pain, hearing loss, postnasal drip and rhinorrhea. Negative for congestion.   Neurological: Negative for dizziness and headaches.     Physical Exam Triage Vital Signs ED Triage Vitals  Enc Vitals Group     BP 02/06/20 1428 122/76     Pulse Rate 02/06/20 1428 77     Resp 02/06/20 1428 18     Temp 02/06/20 1428 (!) 97.5 F (36.4 C)     Temp Source 02/06/20 1428 Oral     SpO2 02/06/20 1428 100 %     Weight 02/06/20 1425 196 lb (88.9 kg)     Height --      Head Circumference --      Peak Flow --      Pain Score 02/06/20 1425 6     Pain Loc --      Pain Edu? --      Excl. in [place]? --    No data found.  Updated Vital Signs BP 122/76 (BP Location: Right Arm)   Pulse 77   Temp (!) 97.5 F (36.4 C) (Oral)   Resp 18   Wt 196 lb (88.9 kg)   SpO2 100%   BMI 33.38 kg/m   Visual Acuity Right Eye Distance:   Left Eye Distance:   Bilateral Distance:    Right Eye Near:   Left Eye Near:    Bilateral Near:  Physical Exam Vitals and nursing note reviewed.  Constitutional:      General: She is not in acute distress.     Appearance: Normal appearance. She is well-developed. She is not ill-appearing.  HENT:     Head: Normocephalic and atraumatic.     Ears:     Comments: Left ear canal without erythema or inflammation TM pearly gray with good landmarks.  Right ear and how without erythema or inflammation.  TM with mild injection.  No obvious evidence of perforation or bleeding.  Mild discomfort on exam Eyes:     Conjunctiva/sclera: Conjunctivae normal.  Cardiovascular:     Rate and Rhythm: Normal rate.  Pulmonary:     Effort: Pulmonary effort is normal. No respiratory distress.  Musculoskeletal:     Cervical back: Neck supple.  Skin:    General: Skin is warm and dry.  Neurological:     General: No focal deficit present.     Mental Status: She is alert and oriented to person, place, and time.  Psychiatric:        Mood and Affect: Mood normal.        Behavior: Behavior normal.        Thought Content: Thought content normal.        Judgment: Judgment normal.      UC Treatments / Results  Labs (all labs ordered are listed, but only abnormal results are displayed) Labs Reviewed - No data to display  EKG   Radiology No results found.  Procedures Procedures (including critical care time)  Medications Ordered in UC Medications - No data to display  Initial Impression / Assessment and Plan / UC Course  I have reviewed the triage vital signs and the nursing notes.  Pertinent labs & imaging results that were available during my care of the patient were reviewed by me and considered in my medical decision making (see chart for details).     #Right ear pain She is a 59 year old presenting with acute concern of right ear pain with bleeding and hearing loss.  There is no obvious sign of perforation though it is possible is a small perforation that is resealed as an etiology for bleeding.  No strong evidence of active infection but given pain and history will start antibiotic.  Patient has  penicillin allergy and does not appear she has been trialed on cephalosporins previously, given this will avoid and start doxycycline twice daily for 7 days. -Patient to follow-up with primary care in 2 weeks for reevaluation -Continue Zyrtec and Flonase daily   Final Clinical Impressions(s) / UC Diagnoses   Final diagnoses:  Right ear pain     Discharge Instructions     Take the doxycycline twice a day for 7 days drink plenty of water with this medication  Please follow-up with your primary care for reevaluation of your ear in 1 to 2 weeks.  Continue the Flonase and Zyrtec     ED Prescriptions    Medication Sig Dispense Auth. Provider   doxycycline (VIBRAMYCIN) 100 MG capsule Take 1 capsule (100 mg total) by mouth 2 (two) times daily for 7 days. 14 capsule Ronya [place], Marguerita Beards, PA-C     PDMP not reviewed this encounter.   Purnell Shoemaker, PA-C 02/06/20 1545

## 2020-02-07 ENCOUNTER — Encounter (HOSPITAL_COMMUNITY): Payer: BC Managed Care – PPO

## 2020-02-09 ENCOUNTER — Encounter (HOSPITAL_COMMUNITY): Payer: BC Managed Care – PPO

## 2020-02-12 ENCOUNTER — Encounter (HOSPITAL_COMMUNITY): Payer: BC Managed Care – PPO

## 2020-02-14 ENCOUNTER — Encounter (HOSPITAL_COMMUNITY): Payer: BC Managed Care – PPO

## 2020-02-15 NOTE — Research (Signed)
As noted above.  Stable post recent admission for non STEMI treated with PCI by Dr. Einar Gip.  Nice outcome.  Stable following AEGIS II infusions.    Cor - regular without murmur Lungs clear No edema  [place] Class 1  V6 study visit.  Bing Quarry, MD

## 2020-02-16 ENCOUNTER — Encounter (HOSPITAL_COMMUNITY): Payer: BC Managed Care – PPO

## 2020-02-19 ENCOUNTER — Encounter (HOSPITAL_COMMUNITY): Payer: BC Managed Care – PPO

## 2020-02-21 ENCOUNTER — Encounter (HOSPITAL_COMMUNITY): Payer: BC Managed Care – PPO

## 2020-02-23 ENCOUNTER — Encounter (HOSPITAL_COMMUNITY): Payer: BC Managed Care – PPO

## 2020-02-26 ENCOUNTER — Encounter (HOSPITAL_COMMUNITY): Payer: BC Managed Care – PPO

## 2020-02-27 ENCOUNTER — Other Ambulatory Visit: Payer: Self-pay | Admitting: Internal Medicine

## 2020-02-28 ENCOUNTER — Encounter (HOSPITAL_COMMUNITY): Payer: BC Managed Care – PPO

## 2020-03-01 ENCOUNTER — Encounter (HOSPITAL_COMMUNITY): Payer: BC Managed Care – PPO

## 2020-03-04 ENCOUNTER — Encounter (HOSPITAL_COMMUNITY): Payer: BC Managed Care – PPO

## 2020-03-06 ENCOUNTER — Encounter (HOSPITAL_COMMUNITY): Payer: BC Managed Care – PPO

## 2020-03-08 ENCOUNTER — Encounter (HOSPITAL_COMMUNITY): Payer: BC Managed Care – PPO

## 2020-03-11 ENCOUNTER — Encounter (HOSPITAL_COMMUNITY): Payer: BC Managed Care – PPO

## 2020-03-19 ENCOUNTER — Telehealth: Payer: Self-pay | Admitting: Internal Medicine

## 2020-03-19 NOTE — Telephone Encounter (Signed)
Medication Refill Request  . Did you call your pharmacy and request this refill first? YES . If patient has not contacted pharmacy first, instruct them to do so for future refills.  . Remind them that contacting the pharmacy for their refill is the quickest method to get the refill.  . Refill policy also stated that it will take anywhere between 24-72 hours to receive the refill.    Name of medication? NOVOLIN N, also needs a new meter with test strips, lancets, syringes  Is this a 90 day supply? YES  Name and location of pharmacy? [facility] claims handler on [address]  . Is the request for diabetes test strips? New meter, etc . If yes, what brand?New meter, etc

## 2020-03-20 MED ORDER — INSULIN NPH (HUMAN) (ISOPHANE) 100 UNIT/ML ~~LOC~~ SUSP: 20.0000 [IU] | Freq: Two times a day (BID) | SUBCUTANEOUS | Status: DC

## 2020-03-20 NOTE — Telephone Encounter (Signed)
We NEVER received a request until now and unfortunately it will take [name] 24-72 hours to process the request since it did not come to [name] electronically.  RX has been sent. Patient must contact pharmacy for refills.

## 2020-03-20 NOTE — Telephone Encounter (Signed)
[employer] called asking about the request for the Novolin N - told them we sent a request back yesterday but he said he called on March 30th requesting it and patient needs RX asap.

## 2020-03-21 MED ORDER — INSULIN NPH (HUMAN) (ISOPHANE) 100 UNIT/ML ~~LOC~~ SUSP: 20.0000 [IU] | Freq: Two times a day (BID) | SUBCUTANEOUS | 6 refills | Status: DC

## 2020-03-21 NOTE — Telephone Encounter (Signed)
RX sent again

## 2020-03-21 NOTE — Telephone Encounter (Signed)
Patient calling to check status of rx for novolin N - pharmacy says they did not get it yesterday.

## 2020-03-21 NOTE — Addendum Note (Signed)
Addended by: Cardell Peach I on: 03/21/2020 12:56 PM   Modules accepted: Orders

## 2020-03-25 ENCOUNTER — Other Ambulatory Visit: Payer: Self-pay | Admitting: Neurology

## 2020-03-27 ENCOUNTER — Encounter: Payer: Self-pay | Admitting: *Deleted

## 2020-03-27 ENCOUNTER — Telehealth: Payer: Self-pay | Admitting: *Deleted

## 2020-03-27 DIAGNOSIS — Z006 Encounter for examination for normal comparison and control in clinical research program: Secondary | ICD-10-CM

## 2020-03-27 NOTE — Telephone Encounter (Signed)
error 

## 2020-03-27 NOTE — Research (Signed)
I called patient for her AEGIS Visit 9 due to COVID-19 restricitons. She is doing well and is working 3 days per week now. She is trying to eat a heart healthy diet, exercise as much as possible and is taking all of her prescribed medications as directed. She reports no AEs and no hospitalizations or urgent care visits since her last AEGIS visit.   Lifestyle Adherence Assessment:    YES NO  Abstinence from smoking/remaining tobacco free x   Cardiac Diet x   Routine physical activity and/or cardiac rehabilitation x     Current Outpatient Medications:  .  aspirin 81 MG chewable tablet, Chew 1 tablet (81 mg total) by mouth daily., Disp:  , Rfl:  .  atorvastatin (LIPITOR) 40 MG tablet, Take 1 tablet by mouth once daily, Disp: 90 tablet, Rfl: 0 .  BRILINTA 90 MG TABS tablet, Take 1 tablet by mouth twice daily, Disp: 180 tablet, Rfl: 1 .  Continuous Blood Gluc Sensor (FREESTYLE LIBRE 14 DAY SENSOR) MISC, 1 each by Does not apply route every 14 (fourteen) days. Change every 2 weeks, Disp: 2 each, Rfl: 11 .  ezetimibe (ZETIA) 10 MG tablet, Take 1 tablet (10 mg total) by mouth daily after supper., Disp: 90 tablet, Rfl: 3 .  gabapentin (NEURONTIN) 300 MG capsule, Take 1 capsule (300 mg total) by mouth 3 (three) times daily., Disp: 90 capsule, Rfl: 3 .  glucose blood (BAYER CONTOUR TEST) test strip, TEST BLOOD SUGARS 3 TIMES DAILY, Disp: 200 each, Rfl: 11 .  insulin NPH Human (NOVOLIN N RELION) 100 UNIT/ML injection, Inject 0.2-0.55 mLs (20-55 Units total) into the skin 2 (two) times daily before a meal., Disp: 99 mL, Rfl: 6 .  insulin regular (NOVOLIN R RELION) 100 units/mL injection, Inject 0.1-0.25 mLs (10-25 Units total) into the skin 3 (three) times daily before meals., Disp: 67.5 mL, Rfl: 1 .  irbesartan (AVAPRO) 75 MG tablet, Take 1 tablet (75 mg total) by mouth daily., Disp: 30 tablet, Rfl: 3 .  Lancet Devices (EASY TOUCH LANCING DEVICE) MISC, Use to check diabetes 4 times daily  E 11.65, Disp: 1  each, Rfl: 11 .  latanoprost (XALATAN) 0.005 % ophthalmic solution, Place 1 drop into both eyes at bedtime. , Disp: , Rfl:  .  metFORMIN (GLUCOPHAGE) 1000 MG tablet, TAKE 1 TABLET BY MOUTH TWICE DAILY WITH MEALS, Disp: 120 tablet, Rfl: 0 .  metoprolol succinate (TOPROL-XL) 50 MG 24 hr tablet, Take 1 tablet (50 mg total) by mouth daily. Take with or immediately following a meal., Disp: 30 tablet, Rfl: 3 .  Multiple Vitamin (MULTIVITAMIN) tablet, Take 1 tablet by mouth daily., Disp: , Rfl:  .  nystatin (NYSTATIN) powder, Apply powder to affected area twice daily until rash has resolved, Disp: 15 g, Rfl: 3 .  omeprazole (PRILOSEC) 40 MG capsule, Take 1 capsule by mouth once daily, Disp: 90 capsule, Rfl: 3 .  ondansetron (ZOFRAN ODT) 4 MG disintegrating tablet, Take 1 tablet (4 mg total) by mouth every 8 (eight) hours as needed for nausea or vomiting., Disp: 20 tablet, Rfl: 0 .  oxyCODONE-acetaminophen (PERCOCET) 7.5-325 MG tablet, Take 1 tablet by mouth 4 (four) times daily as needed., Disp: , Rfl:  .  sulfamethoxazole-trimethoprim (BACTRIM DS) 800-160 MG tablet, Take 1 tablet by mouth 2 (two) times daily., Disp: , Rfl:  .  tiZANidine (ZANAFLEX) 2 MG tablet, Take 2 mg by mouth 2 (two) times daily as needed., Disp: , Rfl:  .  tiZANidine (ZANAFLEX) 4  MG tablet, Take 4 mg by mouth 3 (three) times daily., Disp: , Rfl:  .  vitamin B-12 (CYANOCOBALAMIN) 1000 MCG tablet, Take 1,000 mcg by mouth daily., Disp: , Rfl:  .  cetirizine (ZYRTEC ALLERGY) 10 MG tablet, Take 1 tablet (10 mg total) by mouth daily., Disp: 30 tablet, Rfl: 0 .  fluticasone (FLONASE) 50 MCG/ACT nasal spray, Place 1 spray into both nostrils daily., Disp: 1 g, Rfl: 0

## 2020-04-01 ENCOUNTER — Other Ambulatory Visit: Payer: Self-pay | Admitting: Neurology

## 2020-04-10 ENCOUNTER — Other Ambulatory Visit: Payer: Self-pay | Admitting: Cardiology

## 2020-04-27 LAB — LIPID PANEL WITH LDL/HDL RATIO
Cholesterol, Total: 110 mg/dL (ref 100–199)
HDL: 51 mg/dL (ref >39)
LDL Chol Calc (NIH): 45 mg/dL (ref 0–99)
LDL/HDL Ratio: 0.9 ratio (ref 0.0–3.2)
Triglycerides: 62 mg/dL (ref 0–149)
VLDL Cholesterol Cal: 14 mg/dL (ref 5–40)

## 2020-05-13 ENCOUNTER — Encounter: Payer: Self-pay | Admitting: Internal Medicine

## 2020-05-13 ENCOUNTER — Ambulatory Visit: Payer: BC Managed Care – PPO | Admitting: Internal Medicine

## 2020-05-13 ENCOUNTER — Other Ambulatory Visit: Payer: Self-pay

## 2020-05-13 VITALS — BP 120/82 | HR 88 | Ht 64.25 in | Wt 195.0 lb

## 2020-05-13 DIAGNOSIS — Z794 Long term (current) use of insulin: Secondary | ICD-10-CM

## 2020-05-13 DIAGNOSIS — E1169 Type 2 diabetes mellitus with other specified complication: Secondary | ICD-10-CM

## 2020-05-13 DIAGNOSIS — E669 Obesity, unspecified: Secondary | ICD-10-CM | POA: Diagnosis not present

## 2020-05-13 DIAGNOSIS — E11319 Type 2 diabetes mellitus with unspecified diabetic retinopathy without macular edema: Secondary | ICD-10-CM

## 2020-05-13 DIAGNOSIS — E1165 Type 2 diabetes mellitus with hyperglycemia: Secondary | ICD-10-CM

## 2020-05-13 DIAGNOSIS — Z6832 Body mass index (BMI) 32.0-32.9, adult: Secondary | ICD-10-CM

## 2020-05-13 DIAGNOSIS — E785 Hyperlipidemia, unspecified: Secondary | ICD-10-CM

## 2020-05-13 DIAGNOSIS — IMO0002 Reserved for concepts with insufficient information to code with codable children: Secondary | ICD-10-CM

## 2020-05-13 LAB — POCT GLYCOSYLATED HEMOGLOBIN (HGB A1C): Hemoglobin A1C: 6.4 % — AB (ref 4.0–5.6)

## 2020-05-13 MED ORDER — METFORMIN HCL 1000 MG PO TABS: 1000.0000 mg | ORAL_TABLET | Freq: Two times a day (BID) | ORAL | 3 refills | Status: DC

## 2020-05-13 NOTE — Addendum Note (Signed)
Addended by: Cardell Peach I on: 05/13/2020 03:44 PM   Modules accepted: Orders

## 2020-05-13 NOTE — Patient Instructions (Signed)
Please continue: Insulin Before breakfast Before lunch Before dinner  R 10-15 10-15 20-25  NPH 15 - 25    Continue Metformin 1000 mg 2x a day.  Please let me know if the sugars stay consistently above 130 (before meals) or above 180 (after meals)  Please return in 4 months with your sugar log.

## 2020-05-13 NOTE — Progress Notes (Signed)
Patient ID: RUTHE ROEMER, female   DOB: [birth year], 60 y.o.   MRN: [id number]  This visit occurred during the SARS-CoV-2 public health emergency.  Safety protocols were in place, including screening questions prior to the visit, additional usage of staff PPE, and extensive cleaning of exam room while observing appropriate contact time as indicated for disinfecting solutions.   HPI: JANYA EVELAND is a 59 y.o.-year-old female, returning for f/u for DM2, dx ~2000, insulin-dependent since 2010, uncontrolled, with complications (PN, + DR). Last visit 4 months ago.Marland Kitchen  Before last visit, she had an NSTEMI in 10/2019 and had 3 stents placed.  She started cardiac rehab afterwards.  Her sister also had an MI and she died around the same time.  She is now feeling well, without complaints.  Reviewed HbA1c levels: Lab Results  Component Value Date   HGBA1C 7.6 (A) 08/31/2019   HGBA1C 7.8 (A) 11/25/2018   HGBA1C 6.8 (A) 07/25/2018   HGBA1C 6.4% 03/03/2018   HGBA1C 6.5 11/01/2017   HGBA1C 6.2 07/23/2017   HGBA1C 6.8 04/09/2017   HGBA1C 6.4 01/08/2017   HGBA1C 6.4 10/08/2016   HGBA1C 10.4 07/07/2016   HGBA1C 10.4 04/07/2016   HGBA1C 9.3 01/14/2016   HGBA1C 10.9 (H) 09/25/2015   HGBA1C 11.2 09/19/2015   HGBA1C 7.7 06/21/2015   HGBA1C 7.9 (H) 03/22/2015   HGBA1C 9.4 (H) 01/04/2015   HGBA1C 8.8 (H) 10/04/2014   HGBA1C 10.3 (H) 07/06/2014   HGBA1C 7.0 (H) 03/26/2014   HGBA1C 9.5 (H) 12/26/2013   HGBA1C 7.5 (H) 09/20/2013   HGBA1C 8.5 (H) 06/19/2013   HGBA1C 10.3 (H) 01/31/2013   HGBA1C 10.5 (H) 05/27/2012   HGBA1C 8.4 (H) 11/20/2011   HGBA1C 9.5 (H) 08/11/2011   HGBA1C 6.7 03/07/2007   Pt is on: Insulin Before breakfast Before lunch Before dinner  R 10-15 10-15 20-25  NPH 15 - 25    Metformin 1000 mg twice a day.  Pt is checking her sugars 1-2 times a day: - am:  59, 68, 84, 94-148, 173 >> 49, 95-140, 226 (forgot insulin) >> 69, 89-144, 216 - 2h after b'fast: 154 >> n/c > 203 >> n/c  >> 223 >> n/c - lunch: 80-94 >> 100-118 >> n/c >> 140 >> 81, 87 >> 100-130 - mid-afternoon: 45x1 , 90-115 >> n/c >> 248 >> 116, 189 >> n/c - before dinner: 135 >> 108-114 >> n/c  - 2h after dinner:  216, 219, 451 >> n/c  - bedtime: 119, 143-167, 288  >> 107, 140-180, 196 >> 104-159 >> 150-180 - nighttime: 145-239, 338 >> n/c >> 188, 200, 497 >> n/c Lowest sugar was 38 (inj insulin but ate only little) >> 59 >> 49 >> 69; she has hypoglycemia awareness in the 80s. Highest sugar was  288 >> 497 >> 226.  -No CKD, last BUN/creatinine:  Lab Results  Component Value Date   BUN 15 12/08/2019   CREATININE 0.74 12/08/2019   ACR was normal: Lab Results  Component Value Date   MICRALBCREAT 1.8 06/27/2018   MICRALBCREAT 1.6 06/23/2017   MICRALBCREAT 0.7 06/19/2016   MICRALBCREAT 0.4 09/25/2015   MICRALBCREAT 0.3 07/30/2014   MICRALBCREAT 0.5 06/19/2013   MICRALBCREAT 1.5 01/31/2013   MICRALBCREAT 2.6 11/20/2011  She is not on ACE inhibitor/ARB.  -+ HL; last set of lipids: Lab Results  Component Value Date   CHOL 110 04/26/2020   HDL 51 04/26/2020   LDLCALC 45 04/26/2020   LDLDIRECT 94.0 09/25/2015   TRIG 62 04/26/2020  CHOLHDL 4.4 10/09/2019  On Lipitor.  - last eye exam was in 12/2018: + DR OU, glaucoma- reportedly. She sees Dr. Arnoldo Morale.  -+ numbness and tingling in her feet. Seeing a podiatrist.  She takes Neurontin.  She also has a history of HTN, episodic alcohol abuse, GERD, low back pain, OA.   ROS: Constitutional: no weight gain/no weight loss, no fatigue, + subjective hyperthermia, no subjective hypothermia Eyes: no blurry vision, no xerophthalmia ENT: no sore throat, no nodules palpated in neck, no dysphagia, no odynophagia, no hoarseness Cardiovascular: no CP/no SOB/no palpitations/no leg swelling Respiratory: no cough/no SOB/no wheezing Gastrointestinal: no N/no V/no D/no C/no acid reflux Musculoskeletal: no muscle aches/no joint aches Skin: no rashes, no hair  loss Neurological: no tremors/+ numbness/+ tingling/no dizziness  I reviewed pt's medications, allergies, PMH, social hx, family hx, and changes were documented in the history of present illness. Otherwise, unchanged from my initial visit note.  Past Medical History:  Diagnosis Date  . Arthritis   . Campylobacter enteritis May 2012   [facility] admission  . Carpal tunnel syndrome, bilateral   . Diabetes mellitus   . Gastritis with bleeding due to alcohol 2010   [facility] admission  . GERD (gastroesophageal reflux disease)   . Hyperlipidemia   . Hypertension    no meds  . Menopause   . Myocardial infarction ([place])   . Neck pain   . Wears glasses    Past Surgical History:  Procedure Laterality Date  . CARPAL TUNNEL RELEASE Left 05/14/2014   Procedure: LEFT ULNAR NEUROPLASTY AT ELBOW AND ENDOSCOPIC CARPAL TUNNEL RELEASE;  Surgeon: Jolyn Nap, MD;  Location: [place];  Service: Orthopedics;  Laterality: Left;  . CORONARY STENT INTERVENTION N/A 10/09/2019   Procedure: CORONARY STENT INTERVENTION;  Surgeon: Adrian Prows, MD;  Location: [place] [facility];  Service: Cardiovascular;  Laterality: N/A;  . CYST EXCISION  1995   under arms  . endometrial biopsy  2004   benign  . FOOT OSTEOTOMY  2010   right-spurs  . LEFT HEART CATH AND CORONARY ANGIOGRAPHY N/A 10/09/2019   Procedure: LEFT HEART CATH AND CORONARY ANGIOGRAPHY;  Surgeon: Adrian Prows, MD;  Location: [place] [facility];  Service: Cardiovascular;  Laterality: N/A;  . TONSILLECTOMY    . ULNAR NERVE TRANSPOSITION Left 05/14/2014   Procedure: ULNAR NERVE DECOMPRESSION/TRANSPOSITION;  Surgeon: Jolyn Nap, MD;  Location: [place];  Service: Orthopedics;  Laterality: Left;   Social History   Socioeconomic History  . Marital status: Single    Spouse name: Not on file  . Number of children: 2  . Years of education: 66  . Highest education level: High school graduate  Occupational History  . Not on  file  Tobacco Use  . Smoking status: Former Smoker    Quit date: 05/09/2012    Years since quitting: 8.0  . Smokeless tobacco: Never Used  Vaping Use  . Vaping Use: Never used  Substance and Sexual Activity  . Alcohol use: Not Currently  . Drug use: Yes    Types: Marijuana    Comment: sometimes  . Sexual activity: Yes    Birth control/protection: None    Comment: same partner x 10 years  Other Topics Concern  . Not on file  Social History Narrative   She works as a Sports coach.   Right-handed.   One cup caffeine per day.   She lives at home with daughter.   Social Determinants of Health  Financial Resource Strain:   . Difficulty of Paying Living Expenses:   Food Insecurity:   . Worried About Charity fundraiser in the Last Year:   . Arboriculturist in the Last Year:   Transportation Needs:   . Film/video editor (Medical):   Marland Kitchen Lack of Transportation (Non-Medical):   Physical Activity:   . Days of Exercise per Week:   . Minutes of Exercise per Session:   Stress:   . Feeling of Stress :   Social Connections:   . Frequency of Communication with Friends and Family:   . Frequency of Social Gatherings with Friends and Family:   . Attends Religious Services:   . Active Member of Clubs or Organizations:   . Attends Archivist Meetings:   Marland Kitchen Marital Status:   Intimate Partner Violence:   . Fear of Current or Ex-Partner:   . Emotionally Abused:   Marland Kitchen Physically Abused:   . Sexually Abused:    Current Outpatient Medications on File Prior to Visit  Medication Sig Dispense Refill  . aspirin 81 MG chewable tablet Chew 1 tablet (81 mg total) by mouth daily.    Marland Kitchen atorvastatin (LIPITOR) 40 MG tablet Take 1 tablet by mouth once daily 90 tablet 0  . BRILINTA 90 MG TABS tablet Take 1 tablet by mouth twice daily 180 tablet 1  . cetirizine (ZYRTEC ALLERGY) 10 MG tablet Take 1 tablet (10 mg total) by mouth daily. 30 tablet 0  . Continuous Blood Gluc Sensor (FREESTYLE LIBRE  14 DAY SENSOR) MISC 1 each by Does not apply route every 14 (fourteen) days. Change every 2 weeks 2 each 11  . ezetimibe (ZETIA) 10 MG tablet Take 1 tablet (10 mg total) by mouth daily after supper. 90 tablet 3  . fluticasone (FLONASE) 50 MCG/ACT nasal spray Place 1 spray into both nostrils daily. 1 g 0  . gabapentin (NEURONTIN) 300 MG capsule Take 1 capsule (300 mg total) by mouth 3 (three) times daily. 90 capsule 3  . glucose blood (BAYER CONTOUR TEST) test strip TEST BLOOD SUGARS 3 TIMES DAILY 200 each 11  . insulin NPH Human (NOVOLIN N RELION) 100 UNIT/ML injection Inject 0.2-0.55 mLs (20-55 Units total) into the skin 2 (two) times daily before a meal. 99 mL 6  . insulin regular (NOVOLIN R RELION) 100 units/mL injection Inject 0.1-0.25 mLs (10-25 Units total) into the skin 3 (three) times daily before meals. 67.5 mL 1  . irbesartan (AVAPRO) 75 MG tablet Take 1 tablet by mouth once daily 30 tablet 3  . Lancet Devices (EASY TOUCH LANCING DEVICE) MISC Use to check diabetes 4 times daily  E 11.65 1 each 11  . latanoprost (XALATAN) 0.005 % ophthalmic solution Place 1 drop into both eyes at bedtime.     . metFORMIN (GLUCOPHAGE) 1000 MG tablet TAKE 1 TABLET BY MOUTH TWICE DAILY WITH MEALS 120 tablet 0  . metoprolol succinate (TOPROL-XL) 50 MG 24 hr tablet TAKE 1 TABLET BY MOUTH ONCE DAILY. TAKE WITH OR IMMEDIATELY FOLLOWING A MEAL 30 tablet 3  . Multiple Vitamin (MULTIVITAMIN) tablet Take 1 tablet by mouth daily.    Marland Kitchen nystatin (NYSTATIN) powder Apply powder to affected area twice daily until rash has resolved 15 g 3  . omeprazole (PRILOSEC) 40 MG capsule Take 1 capsule by mouth once daily 90 capsule 3  . ondansetron (ZOFRAN ODT) 4 MG disintegrating tablet Take 1 tablet (4 mg total) by mouth every 8 (eight) hours as  needed for nausea or vomiting. 20 tablet 0  . oxyCODONE-acetaminophen (PERCOCET) 7.5-325 MG tablet Take 1 tablet by mouth 4 (four) times daily as needed.    . sulfamethoxazole-trimethoprim  (BACTRIM DS) 800-160 MG tablet Take 1 tablet by mouth 2 (two) times daily.    Marland Kitchen tiZANidine (ZANAFLEX) 2 MG tablet Take 2 mg by mouth 2 (two) times daily as needed.    Marland Kitchen tiZANidine (ZANAFLEX) 4 MG tablet Take 4 mg by mouth 3 (three) times daily.    . vitamin B-12 (CYANOCOBALAMIN) 1000 MCG tablet Take 1,000 mcg by mouth daily.     No current facility-administered medications on file prior to visit.   Allergies  Allergen Reactions  . Oxycodone-Acetaminophen Other (See Comments)    Causes her to feel "hot".  . Penicillins Rash    Did it involve swelling of the face/tongue/throat, SOB, or low BP? No Did it involve sudden or severe rash/hives, skin peeling, or any reaction on the inside of your mouth or nose? No Did you need to seek medical attention at a hospital or doctor's office? No When did it last happen? If all above answers are "NO", may proceed with cephalosporin use.   Family History  Problem Relation Age of Onset  . Hypertension Mother   . Diabetes Mother   . Heart attack Mother   . Stroke Father   . Diabetes Father   . Diabetes Maternal Grandmother   . Hypertension Maternal Grandmother   . Heart attack Sister     PE: BP 120/82   Pulse 88   Ht 5' 4.25" (1.632 m)   Wt 195 lb (88.5 kg)   SpO2 99%   BMI 33.21 kg/m  Body mass index is 33.21 kg/m. Wt Readings from Last 3 Encounters:  05/13/20 195 lb (88.5 kg)  02/06/20 196 lb (88.9 kg)  01/19/20 197 lb 12.8 oz (89.7 kg)   Constitutional: overweight, in NAD Eyes: PERRLA, EOMI, no exophthalmos ENT: moist mucous membranes, no thyromegaly, no cervical lymphadenopathy Cardiovascular: RRR, No MRG Respiratory: CTA B Gastrointestinal: abdomen soft, NT, ND, BS+ Musculoskeletal: no deformities, strength intact in all 4 Skin: moist, warm, no rashes Neurological: no tremor with outstretched hands, DTR normal in all 4  ASSESSMENT: 1. DM2, insulin-dependent, uncontrolled, with complications - peripheral neuropathy -  DR   2. Obesity class 1 - BMI 32.79  BMI Classification:  < 18.5 underweight   18.5-24.9 normal weight   25.0-29.9 overweight   30.0-34.9 class I obesity   35.0-39.9 class II obesity   ? 40.0 class III obesity   3. HL  PLAN:  1. Patient with longstanding, uncontrolled, type 2 diabetes, with improved control in the last 2 years, after she started to take her insulin consistently.  Her diabetes was completely out of control when she was taking insulin erratically in the past. -At last visit, she returned after having had an NSTEMI with 3 subsequent stents.  She started cardiac rehab since then.  Sugars appeared improved, with one blood sugar low, at 49, after she took too much insulin the night before.  The rest of the sugars were mostly at goal or slightly above goal.  We did not change her regimen at that time.  An HbA1c could not be checked since this was a virtual appointment.  Latest HbA1c available is from 08/2019 and this was 7.6%. -At this visit, sugars are at or slightly above goal and she is doing a good job taking her medications consistently and checking blood sugars.  She has occasional slight hypoglycemic blood sugars in the morning and sometimes after dinner, but for now, I do not feel we need to change her regimen. -Refilled her Metformin prescription. - I advised her to: Patient Instructions   Please continue: Insulin Before breakfast Before lunch Before dinner  R 10-15 10-15 20-25  NPH 15 - 25    Continue Metformin 1000 mg 2x a day.  Please let me know if the sugars stay consistently above 130 (before meals) or above 180 (after meals)  Please return in 4 months with your sugar log.  - we checked her HbA1c: 6.4% (lower) - advised to check sugars at different times of the day - 3x a day, rotating check times - advised for yearly eye exams >> she is not UTD - return to clinic in 4 months     2. Obesity class 1 -Continue Metformin, which is weight stabilizing  long-term -She did not lose weight before last visit  3. HL -Reviewed latest lipid panel from 03/2020: All fractions at goal Lab Results  Component Value Date   CHOL 110 04/26/2020   HDL 51 04/26/2020   LDLCALC 45 04/26/2020   LDLDIRECT 94.0 09/25/2015   TRIG 62 04/26/2020   CHOLHDL 4.4 10/09/2019  -Continues Lipitor without side effects  Philemon Kingdom, MD PhD [facility] Endocrinology

## 2020-05-21 ENCOUNTER — Ambulatory Visit (INDEPENDENT_AMBULATORY_CARE_PROVIDER_SITE_OTHER): Payer: BC Managed Care – PPO

## 2020-05-21 ENCOUNTER — Ambulatory Visit: Payer: BC Managed Care – PPO | Admitting: Family Medicine

## 2020-05-21 ENCOUNTER — Other Ambulatory Visit: Payer: Self-pay

## 2020-05-21 ENCOUNTER — Encounter: Payer: Self-pay | Admitting: Family Medicine

## 2020-05-21 VITALS — BP 108/82 | HR 79 | Ht 64.0 in | Wt 194.0 lb

## 2020-05-21 DIAGNOSIS — M170 Bilateral primary osteoarthritis of knee: Secondary | ICD-10-CM | POA: Insufficient documentation

## 2020-05-21 DIAGNOSIS — G8929 Other chronic pain: Secondary | ICD-10-CM

## 2020-05-21 DIAGNOSIS — M25562 Pain in left knee: Secondary | ICD-10-CM | POA: Diagnosis not present

## 2020-05-21 DIAGNOSIS — M25561 Pain in right knee: Secondary | ICD-10-CM

## 2020-05-21 NOTE — Assessment & Plan Note (Signed)
Moderate arthritic changes.  Patient has had some for quite some time.  Patient does not have now some mild instability.  Patient does maybe have some potential lumbar radiculopathy that we will need to monitor.  Discussed which activities to doing which wants to avoid.  Increase activity slowly.  Patient could be a candidate for viscosupplementation and we will see if patient can get approval.  Discussed medication management otherwise including the gabapentin.  Follow-up again in 4 to 8 weeks

## 2020-05-21 NOTE — Patient Instructions (Signed)
Good to see you  [name] is your friend Stay active.  Tried steroid injections today  xrays on way out pennsaid pinkie amount topically 2 times daily as needed.  See me again in 4-6 weeks and if not better then will do gel injections

## 2020-05-21 NOTE — Progress Notes (Signed)
[place] Phone: [phone] Subjective:   [name], am serving as a scribe for Dr. Hulan Saas. This visit occurred during the SARS-CoV-2 public health emergency.  Safety protocols were in place, including screening questions prior to the visit, additional usage of staff PPE, and extensive cleaning of exam room while observing appropriate contact time as indicated for disinfecting solutions.   I'm seeing this patient by the request  of:  Center, [facility]  CC: Bilateral knee pain  DHR:[id number]   03/21/2019 Patient given injection.  Patient did have significant amount of pain after the injection.  Patient took approximately 5 minutes to be able to move the knee but then was able to ambulate with a mild antalgic gait.  We discussed with patient in great length about this.  We discussed that due to patient also having the degenerative meniscal tear there is a concern that the viscosupplementation could move this.  Patient though does not have any locking and did have full range of motion and was able to ambulate on her own strength.  We discussed that this will take many weeks time improved.  Some short course of tramadol given today.  Patient is not on a chronic pain management contract with primary care provider but is given tramadol intermittently and has worked in the past.  Update 05/21/2020 LOREAN EKSTRAND is a 59 y.o. female coming in with complaint of bilateral knee pain. Patient states that she had relief from last injection for one month in left leg. Complains of right knee pain as well.  Patient states that unfortunately the pain is worse with meds and is starting to affect daily activities.  Rates the severity of pain is 8 out of 10.       Past Medical History:  Diagnosis Date  . Arthritis   . Campylobacter enteritis May 2012   [facility] admission  . Carpal tunnel syndrome, bilateral   . Diabetes  mellitus   . Gastritis with bleeding due to alcohol 2010   [facility] admission  . GERD (gastroesophageal reflux disease)   . Hyperlipidemia   . Hypertension    no meds  . Menopause   . Myocardial infarction ([place])   . Neck pain   . Wears glasses    Past Surgical History:  Procedure Laterality Date  . CARPAL TUNNEL RELEASE Left 05/14/2014   Procedure: LEFT ULNAR NEUROPLASTY AT ELBOW AND ENDOSCOPIC CARPAL TUNNEL RELEASE;  Surgeon: Jolyn Nap, MD;  Location: [place];  Service: Orthopedics;  Laterality: Left;  . CORONARY STENT INTERVENTION N/A 10/09/2019   Procedure: CORONARY STENT INTERVENTION;  Surgeon: Adrian Prows, MD;  Location: [place] [facility];  Service: Cardiovascular;  Laterality: N/A;  . CYST EXCISION  1995   under arms  . endometrial biopsy  2004   benign  . FOOT OSTEOTOMY  2010   right-spurs  . LEFT HEART CATH AND CORONARY ANGIOGRAPHY N/A 10/09/2019   Procedure: LEFT HEART CATH AND CORONARY ANGIOGRAPHY;  Surgeon: Adrian Prows, MD;  Location: [place] [facility];  Service: Cardiovascular;  Laterality: N/A;  . TONSILLECTOMY    . ULNAR NERVE TRANSPOSITION Left 05/14/2014   Procedure: ULNAR NERVE DECOMPRESSION/TRANSPOSITION;  Surgeon: Jolyn Nap, MD;  Location: [place];  Service: Orthopedics;  Laterality: Left;   Social History   Socioeconomic History  . Marital status: Single    Spouse name: Not on file  . Number of  children: 2  . Years of education: 29  . Highest education level: High school graduate  Occupational History  . Not on file  Tobacco Use  . Smoking status: Former Smoker    Quit date: 05/09/2012    Years since quitting: 8.0  . Smokeless tobacco: Never Used  Vaping Use  . Vaping Use: Never used  Substance and Sexual Activity  . Alcohol use: Not Currently  . Drug use: Yes    Types: Marijuana    Comment: sometimes  . Sexual activity: Yes    Birth control/protection: None    Comment: same partner x 10 years  Other  Topics Concern  . Not on file  Social History Narrative   She works as a Sports coach.   Right-handed.   One cup caffeine per day.   She lives at home with daughter.   Social Determinants of Health   Financial Resource Strain:   . Difficulty of Paying Living Expenses:   Food Insecurity:   . Worried About Charity fundraiser in the Last Year:   . Arboriculturist in the Last Year:   Transportation Needs:   . Film/video editor (Medical):   Marland Kitchen Lack of Transportation (Non-Medical):   Physical Activity:   . Days of Exercise per Week:   . Minutes of Exercise per Session:   Stress:   . Feeling of Stress :   Social Connections:   . Frequency of Communication with Friends and Family:   . Frequency of Social Gatherings with Friends and Family:   . Attends Religious Services:   . Active Member of Clubs or Organizations:   . Attends Archivist Meetings:   Marland Kitchen Marital Status:    Allergies  Allergen Reactions  . Oxycodone-Acetaminophen Other (See Comments)    Causes her to feel "hot".  . Penicillins Rash    Did it involve swelling of the face/tongue/throat, SOB, or low BP? No Did it involve sudden or severe rash/hives, skin peeling, or any reaction on the inside of your mouth or nose? No Did you need to seek medical attention at a hospital or doctor's office? No When did it last happen? If all above answers are "NO", may proceed with cephalosporin use.   Family History  Problem Relation Age of Onset  . Hypertension Mother   . Diabetes Mother   . Heart attack Mother   . Stroke Father   . Diabetes Father   . Diabetes Maternal Grandmother   . Hypertension Maternal Grandmother   . Heart attack Sister     Current Outpatient Medications (Endocrine & Metabolic):  .  insulin NPH Human (NOVOLIN N RELION) 100 UNIT/ML injection, Inject 0.2-0.55 mLs (20-55 Units total) into the skin 2 (two) times daily before a meal. .  insulin regular (NOVOLIN R RELION) 100 units/mL  injection, Inject 0.1-0.25 mLs (10-25 Units total) into the skin 3 (three) times daily before meals. .  metFORMIN (GLUCOPHAGE) 1000 MG tablet, Take 1 tablet (1,000 mg total) by mouth 2 (two) times daily with a meal.  Current Outpatient Medications (Cardiovascular):  .  atorvastatin (LIPITOR) 40 MG tablet, Take 1 tablet by mouth once daily .  ezetimibe (ZETIA) 10 MG tablet, Take 1 tablet (10 mg total) by mouth daily after supper. .  irbesartan (AVAPRO) 75 MG tablet, Take 1 tablet by mouth once daily .  metoprolol succinate (TOPROL-XL) 50 MG 24 hr tablet, TAKE 1 TABLET BY MOUTH ONCE DAILY. TAKE WITH OR IMMEDIATELY FOLLOWING A MEAL  Current Outpatient Medications (Respiratory):  .  cetirizine (ZYRTEC ALLERGY) 10 MG tablet, Take 1 tablet (10 mg total) by mouth daily. .  fluticasone (FLONASE) 50 MCG/ACT nasal spray, Place 1 spray into both nostrils daily.  Current Outpatient Medications (Analgesics):  .  aspirin 81 MG chewable tablet, Chew 1 tablet (81 mg total) by mouth daily. Marland Kitchen  oxyCODONE-acetaminophen (PERCOCET) 7.5-325 MG tablet, Take 1 tablet by mouth 4 (four) times daily as needed.  Current Outpatient Medications (Hematological):  Marland Kitchen  BRILINTA 90 MG TABS tablet, Take 1 tablet by mouth twice daily .  vitamin B-12 (CYANOCOBALAMIN) 1000 MCG tablet, Take 1,000 mcg by mouth daily.  Current Outpatient Medications (Other):  Marland Kitchen  Continuous Blood Gluc Sensor (FREESTYLE LIBRE 14 DAY SENSOR) MISC, 1 each by Does not apply route every 14 (fourteen) days. Change every 2 weeks .  gabapentin (NEURONTIN) 300 MG capsule, Take 1 capsule (300 mg total) by mouth 3 (three) times daily. Marland Kitchen  glucose blood (BAYER CONTOUR TEST) test strip, TEST BLOOD SUGARS 3 TIMES DAILY .  Lancet Devices (EASY TOUCH LANCING DEVICE) MISC, Use to check diabetes 4 times daily  E 11.65 .  latanoprost (XALATAN) 0.005 % ophthalmic solution, Place 1 drop into both eyes at bedtime.  .  Multiple Vitamin (MULTIVITAMIN) tablet, Take 1 tablet  by mouth daily. Marland Kitchen  nystatin (NYSTATIN) powder, Apply powder to affected area twice daily until rash has resolved .  omeprazole (PRILOSEC) 40 MG capsule, Take 1 capsule by mouth once daily .  ondansetron (ZOFRAN ODT) 4 MG disintegrating tablet, Take 1 tablet (4 mg total) by mouth every 8 (eight) hours as needed for nausea or vomiting. .  sulfamethoxazole-trimethoprim (BACTRIM DS) 800-160 MG tablet, Take 1 tablet by mouth 2 (two) times daily. Marland Kitchen  tiZANidine (ZANAFLEX) 2 MG tablet, Take 2 mg by mouth 2 (two) times daily as needed. Marland Kitchen  tiZANidine (ZANAFLEX) 4 MG tablet, Take 4 mg by mouth 3 (three) times daily.   Reviewed prior external information including notes and imaging from  primary care provider As well as notes that were available from care everywhere and other healthcare systems.  Past medical history, social, surgical and family history all reviewed in electronic medical record.  No pertanent information unless stated regarding to the chief complaint.   Review of Systems:  No headache, visual changes, nausea, vomiting, diarrhea, constipation, dizziness, abdominal pain, skin rash, fevers, chills, night sweats, weight loss, swollen lymph nodes, , chest pain, shortness of breath, mood changes. POSITIVE muscle aches, body aches, joint swelling  Objective  Blood pressure 108/82, pulse 79, height 5\' 4"  (1.626 m), weight 194 lb (88 kg), SpO2 99 %.   General: No apparent distress alert and oriented x3 mood and affect normal, dressed appropriately.  HEENT: Pupils equal, extraocular movements intact  Respiratory: Patient's speak in full sentences and does not appear short of breath  Cardiovascular: No lower extremity edema, non tender, no erythema  Neuro: Cranial nerves II through XII are intact, neurovascularly intact in all extremities with 2+ DTRs and 2+ pulses.  Gait severely antalgic.  Patient is actually also has some loss of lordosis which is new from patient's previous exam.  Patient is  any significant arthritic changes with instability noted on the left with varus and valgus force as well as with varus force on the right.  After informed written and verbal consent, patient was seated on exam table. Right knee was prepped with alcohol swab and utilizing anterolateral approach, patient's right knee space was injected  with 4:1  marcaine 0.5%: Kenalog 40mg /dL. Patient tolerated the procedure well without immediate complications.  After informed written and verbal consent, patient was seated on exam table. Left knee was prepped with alcohol swab and utilizing anterolateral approach, patient's left knee space was injected with 4:1  marcaine 0.5%: Kenalog 40mg /dL. Patient tolerated the procedure well without immediate complications.   Impression and Recommendations:     The above documentation has been reviewed and is accurate and complete Lyndal Pulley, DO       Note: This dictation was prepared with Dragon dictation along with smaller phrase technology. Any transcriptional errors that result from this process are unintentional.

## 2020-06-01 ENCOUNTER — Other Ambulatory Visit: Payer: Self-pay | Admitting: Internal Medicine

## 2020-07-03 ENCOUNTER — Encounter: Payer: Self-pay | Admitting: *Deleted

## 2020-07-03 DIAGNOSIS — Z006 Encounter for examination for normal comparison and control in clinical research program: Secondary | ICD-10-CM

## 2020-07-03 NOTE — Research (Signed)
[facility] visit 10  Phone call d/t COVID-19 precautions.  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 This pain limits the amount of walking that she does. She is eating a heart healthy diet and reports that her A1c is down to 6.4. She has had no visits to any Hospitals, ER or Urgent Cares since our last Research visit. [name] continues to take her medications as prescribed. She voiced understanding that she can call our office if she has any questions or concerns.                                      "CONSENT"   YES     NO   Continuing further Investigational Product and study visits for follow-up? [x]  []   Continuing consent from future biomedical research [x]  []                                      "EVENTS"    YES     NO  AE   (IF YES SEE SOURCE) []  [x]   SAE  (IF YES SEE SOURCE) []  [x]   ENDPOINT   (IF YES SEE SOURCE) []  [x]   REVASCULARIZATION  (IF YES SEE SOURCE) []  [x]   AMPUTATION   (IF YES SEE SOURCE) []  [x]   TROPONIN'S  (IF YES SEE SOURCE) []  [x]    Lifestyle Adherence Assessment:    YES NO  Abstinence from smoking/remaining tobacco free x   Cardiac Diet x   Routine physical activity and/or cardiac rehabilitation x     Current Outpatient Medications:  .  aspirin 81 MG chewable tablet, Chew 1 tablet (81 mg total) by mouth daily., Disp:  , Rfl:  .  atorvastatin (LIPITOR) 40 MG tablet, Take 1 tablet by mouth once daily, Disp: 90 tablet, Rfl: 0 .  BRILINTA 90 MG TABS tablet, Take 1 tablet by mouth twice daily, Disp: 180 tablet, Rfl: 1 .  cetirizine (ZYRTEC ALLERGY) 10 MG tablet, Take 1 tablet (10 mg total) by mouth daily., Disp: 30 tablet, Rfl: 0 .  Continuous Blood Gluc Sensor (FREESTYLE LIBRE 14 DAY SENSOR) MISC, 1 each by Does not apply route every 14 (fourteen) days. Change every 2 weeks, Disp: 2 each, Rfl: 11 .  ezetimibe (ZETIA) 10  MG tablet, Take 1 tablet (10 mg total) by mouth daily after supper., Disp: 90 tablet, Rfl: 3 .  fluticasone (FLONASE) 50 MCG/ACT nasal spray, Place 1 spray into both nostrils daily., Disp: 1 g, Rfl: 0 .  gabapentin (NEURONTIN) 300 MG capsule, Take 1 capsule (300 mg total) by mouth 3 (three) times daily., Disp: 90 capsule, Rfl: 3 .  glucose blood (BAYER CONTOUR TEST) test strip, TEST BLOOD SUGARS 3 TIMES DAILY, Disp: 200 each, Rfl: 11 .  insulin NPH Human (NOVOLIN N RELION) 100 UNIT/ML injection, Inject 0.2-0.55 mLs (20-55 Units total) into the skin 2 (two) times daily before a meal., Disp: 99 mL, Rfl: 6 .  insulin regular (NOVOLIN R RELION) 100 units/mL injection, Inject 0.1-0.25 mLs (10-25 Units total) into the skin 3 (three) times daily before meals., Disp: 67.5 mL, Rfl: 1 .  irbesartan (AVAPRO) 75 MG tablet, Take 1 tablet by mouth once daily, Disp: 30  tablet, Rfl: 3 .  Lancet Devices (EASY TOUCH LANCING DEVICE) MISC, Use to check diabetes 4 times daily  E 11.65, Disp: 1 each, Rfl: 11 .  latanoprost (XALATAN) 0.005 % ophthalmic solution, Place 1 drop into both eyes at bedtime. , Disp: , Rfl:  .  metFORMIN (GLUCOPHAGE) 1000 MG tablet, Take 1 tablet (1,000 mg total) by mouth 2 (two) times daily with a meal., Disp: 180 tablet, Rfl: 3 .  metoprolol succinate (TOPROL-XL) 50 MG 24 hr tablet, TAKE 1 TABLET BY MOUTH ONCE DAILY. TAKE WITH OR IMMEDIATELY FOLLOWING A MEAL, Disp: 30 tablet, Rfl: 3 .  Multiple Vitamin (MULTIVITAMIN) tablet, Take 1 tablet by mouth daily., Disp: , Rfl:  .  nystatin (NYSTATIN) powder, Apply powder to affected area twice daily until rash has resolved, Disp: 15 g, Rfl: 3 .  omeprazole (PRILOSEC) 40 MG capsule, Take 1 capsule by mouth once daily, Disp: 90 capsule, Rfl: 0 .  ondansetron (ZOFRAN ODT) 4 MG disintegrating tablet, Take 1 tablet (4 mg total) by mouth every 8 (eight) hours as needed for nausea or vomiting., Disp: 20 tablet, Rfl: 0 .  oxyCODONE-acetaminophen (PERCOCET)  7.5-325 MG tablet, Take 1 tablet by mouth 4 (four) times daily as needed., Disp: , Rfl:  .  sulfamethoxazole-trimethoprim (BACTRIM DS) 800-160 MG tablet, Take 1 tablet by mouth 2 (two) times daily., Disp: , Rfl:  .  tiZANidine (ZANAFLEX) 2 MG tablet, Take 2 mg by mouth 2 (two) times daily as needed., Disp: , Rfl:  .  tiZANidine (ZANAFLEX) 4 MG tablet, Take 4 mg by mouth 3 (three) times daily., Disp: , Rfl:  .  vitamin B-12 (CYANOCOBALAMIN) 1000 MCG tablet, Take 1,000 mcg by mouth daily., Disp: , Rfl:

## 2020-07-04 ENCOUNTER — Encounter: Payer: Self-pay | Admitting: Family Medicine

## 2020-07-04 ENCOUNTER — Ambulatory Visit (INDEPENDENT_AMBULATORY_CARE_PROVIDER_SITE_OTHER): Payer: BC Managed Care – PPO | Admitting: Family Medicine

## 2020-07-04 ENCOUNTER — Other Ambulatory Visit: Payer: Self-pay

## 2020-07-04 DIAGNOSIS — M170 Bilateral primary osteoarthritis of knee: Secondary | ICD-10-CM

## 2020-07-04 NOTE — Assessment & Plan Note (Addendum)
Viscosupplementation given today.  Moderate to severe arthritic changes have been noted previously.  Patient is going to continue with conservative therapy.  Did have some tightness on the left side after the injections and warned her that if any worsening symptoms seek medical attention immediately but likely should be fine.  Patient was able to ambulate on her own power.  Patient will follow up with me again in 6 to 8 weeks    Called patient as well at 4:46 to check in on patient.  Patient states that she is still limping somewhat but feels like the pain has improved a little bit.  States that the pain is more on the anterior medial aspect of the knee.  No calf pain.

## 2020-07-04 NOTE — Patient Instructions (Signed)
Iron 65mg  with 500mg  of Vit C Gel injections today See me again in 2-3 months

## 2020-07-04 NOTE — Progress Notes (Signed)
[place] [address] [place] Phone: [phone] Subjective:   I [name] am serving as a Education administrator for Dr. Hulan Saas.  This visit occurred during the SARS-CoV-2 public health emergency.  Safety protocols were in place, including screening questions prior to the visit, additional usage of staff PPE, and extensive cleaning of exam room while observing appropriate contact time as indicated for disinfecting solutions.   I'm seeing this patient by the request  of:  [facility]  CC: Bilateral knee pain  [id number]   05/21/2020 Moderate arthritic changes.  Patient has had some for quite some time.  Patient does not have now some mild instability.  Patient does maybe have some potential lumbar radiculopathy that we will need to monitor.  Discussed which activities to doing which wants to avoid.  Increase activity slowly.  Patient could be a candidate for viscosupplementation and we will see if patient can get approval.  Discussed medication management otherwise including the gabapentin.  Follow-up again in 4 to 8 weeks  Update 07/04/2020 [name] is a 59 y.o. female coming in with complaint of bilateral knee pain. Patient states her knees are painful. Bilateral knee pain, known arthritis, failed conservative therapy including steroid injections.  Continues to have pain "in the right.  Patient's left side has had a known meniscal tear as well as moderate to severe arthritic changes on MRI in 2020.  Patient still wants to avoid any surgical intervention.    Past Medical History:  Diagnosis Date  . Arthritis   . Campylobacter enteritis May 2012   [facility] admission  . Carpal tunnel syndrome, bilateral   . Diabetes mellitus   . Gastritis with bleeding due to alcohol 2010   [facility] admission  . GERD (gastroesophageal reflux disease)   . Hyperlipidemia   . Hypertension    no meds  . Menopause   . Myocardial infarction ([place])    . Neck pain   . Wears glasses    Past Surgical History:  Procedure Laterality Date  . CARPAL TUNNEL RELEASE Left 05/14/2014   Procedure: LEFT ULNAR NEUROPLASTY AT ELBOW AND ENDOSCOPIC CARPAL TUNNEL RELEASE;  Surgeon: Jolyn Nap, MD;  Location: [place];  Service: Orthopedics;  Laterality: Left;  . CORONARY STENT INTERVENTION N/A 10/09/2019   Procedure: CORONARY STENT INTERVENTION;  Surgeon: Adrian Prows, MD;  Location: [place] [facility];  Service: Cardiovascular;  Laterality: N/A;  . CYST EXCISION  1995   under arms  . endometrial biopsy  2004   benign  . FOOT OSTEOTOMY  2010   right-spurs  . LEFT HEART CATH AND CORONARY ANGIOGRAPHY N/A 10/09/2019   Procedure: LEFT HEART CATH AND CORONARY ANGIOGRAPHY;  Surgeon: Adrian Prows, MD;  Location: [place] [facility];  Service: Cardiovascular;  Laterality: N/A;  . TONSILLECTOMY    . ULNAR NERVE TRANSPOSITION Left 05/14/2014   Procedure: ULNAR NERVE DECOMPRESSION/TRANSPOSITION;  Surgeon: Jolyn Nap, MD;  Location: [place];  Service: Orthopedics;  Laterality: Left;   Social History   Socioeconomic History  . Marital status: Single    Spouse name: Not on file  . Number of children: 2  . Years of education: 110  . Highest education level: High school graduate  Occupational History  . Not on file  Tobacco Use  . Smoking status: Former Smoker    Quit date: 05/09/2012    Years since quitting: 8.1  . Smokeless tobacco: Never Used  Vaping Use  . Vaping Use: Never used  Substance and Sexual Activity  . Alcohol use: Not Currently  . Drug use: Yes    Types: Marijuana    Comment: sometimes  . Sexual activity: Yes    Birth control/protection: None    Comment: same partner x 10 years  Other Topics Concern  . Not on file  Social History Narrative   She works as a Sports coach.   Right-handed.   One cup caffeine per day.   She lives at home with daughter.   Social Determinants of Health   Financial  Resource Strain:   . Difficulty of Paying Living Expenses:   Food Insecurity:   . Worried About Charity fundraiser in the Last Year:   . Arboriculturist in the Last Year:   Transportation Needs:   . Film/video editor (Medical):   Marland Kitchen Lack of Transportation (Non-Medical):   Physical Activity:   . Days of Exercise per Week:   . Minutes of Exercise per Session:   Stress:   . Feeling of Stress :   Social Connections:   . Frequency of Communication with Friends and Family:   . Frequency of Social Gatherings with Friends and Family:   . Attends Religious Services:   . Active Member of Clubs or Organizations:   . Attends Archivist Meetings:   Marland Kitchen Marital Status:    Allergies  Allergen Reactions  . Oxycodone-Acetaminophen Other (See Comments)    Causes her to feel "hot".  . Penicillins Rash    Did it involve swelling of the face/tongue/throat, SOB, or low BP? No Did it involve sudden or severe rash/hives, skin peeling, or any reaction on the inside of your mouth or nose? No Did you need to seek medical attention at a hospital or doctor's office? No When did it last happen? If all above answers are "NO", may proceed with cephalosporin use.   Family History  Problem Relation Age of Onset  . Hypertension Mother   . Diabetes Mother   . Heart attack Mother   . Stroke Father   . Diabetes Father   . Diabetes Maternal Grandmother   . Hypertension Maternal Grandmother   . Heart attack Sister     Current Outpatient Medications (Endocrine & Metabolic):  .  insulin NPH Human (NOVOLIN N RELION) 100 UNIT/ML injection, Inject 0.2-0.55 mLs (20-55 Units total) into the skin 2 (two) times daily before a meal. .  insulin regular (NOVOLIN R RELION) 100 units/mL injection, Inject 0.1-0.25 mLs (10-25 Units total) into the skin 3 (three) times daily before meals. .  metFORMIN (GLUCOPHAGE) 1000 MG tablet, Take 1 tablet (1,000 mg total) by mouth 2 (two) times daily with a  meal.  Current Outpatient Medications (Cardiovascular):  .  atorvastatin (LIPITOR) 40 MG tablet, Take 1 tablet by mouth once daily .  ezetimibe (ZETIA) 10 MG tablet, Take 1 tablet (10 mg total) by mouth daily after supper. .  irbesartan (AVAPRO) 75 MG tablet, Take 1 tablet by mouth once daily .  metoprolol succinate (TOPROL-XL) 50 MG 24 hr tablet, TAKE 1 TABLET BY MOUTH ONCE DAILY. TAKE WITH OR IMMEDIATELY FOLLOWING A MEAL  Current Outpatient Medications (Respiratory):  .  cetirizine (ZYRTEC ALLERGY) 10 MG tablet, Take 1 tablet (10 mg total) by mouth daily. .  fluticasone (FLONASE) 50 MCG/ACT nasal spray, Place 1 spray into both nostrils daily.  Current Outpatient Medications (Analgesics):  .  aspirin 81 MG chewable tablet, Chew 1  tablet (81 mg total) by mouth daily. Marland Kitchen  oxyCODONE-acetaminophen (PERCOCET) 7.5-325 MG tablet, Take 1 tablet by mouth 4 (four) times daily as needed.  Current Outpatient Medications (Hematological):  Marland Kitchen  BRILINTA 90 MG TABS tablet, Take 1 tablet by mouth twice daily .  vitamin B-12 (CYANOCOBALAMIN) 1000 MCG tablet, Take 1,000 mcg by mouth daily.  Current Outpatient Medications (Other):  Marland Kitchen  Continuous Blood Gluc Sensor (FREESTYLE LIBRE 14 DAY SENSOR) MISC, 1 each by Does not apply route every 14 (fourteen) days. Change every 2 weeks .  gabapentin (NEURONTIN) 300 MG capsule, Take 1 capsule (300 mg total) by mouth 3 (three) times daily. Marland Kitchen  glucose blood (BAYER CONTOUR TEST) test strip, TEST BLOOD SUGARS 3 TIMES DAILY .  Lancet Devices (EASY TOUCH LANCING DEVICE) MISC, Use to check diabetes 4 times daily  E 11.65 .  latanoprost (XALATAN) 0.005 % ophthalmic solution, Place 1 drop into both eyes at bedtime.  .  Multiple Vitamin (MULTIVITAMIN) tablet, Take 1 tablet by mouth daily. Marland Kitchen  nystatin (NYSTATIN) powder, Apply powder to affected area twice daily until rash has resolved .  omeprazole (PRILOSEC) 40 MG capsule, Take 1 capsule by mouth once daily .  ondansetron  (ZOFRAN ODT) 4 MG disintegrating tablet, Take 1 tablet (4 mg total) by mouth every 8 (eight) hours as needed for nausea or vomiting. .  sulfamethoxazole-trimethoprim (BACTRIM DS) 800-160 MG tablet, Take 1 tablet by mouth 2 (two) times daily. Marland Kitchen  tiZANidine (ZANAFLEX) 2 MG tablet, Take 2 mg by mouth 2 (two) times daily as needed. Marland Kitchen  tiZANidine (ZANAFLEX) 4 MG tablet, Take 4 mg by mouth 3 (three) times daily.   Reviewed prior external information including notes and imaging from  primary care provider As well as notes that were available from care everywhere and other healthcare systems.  Past medical history, social, surgical and family history all reviewed in electronic medical record.  No pertanent information unless stated regarding to the chief complaint.   Review of Systems:  No headache, visual changes, nausea, vomiting, diarrhea, constipation, dizziness, abdominal pain, skin rash, fevers, chills, night sweats, weight loss, swollen lymph nodes, body aches, joint swelling, chest pain, shortness of breath, mood changes. POSITIVE muscle aches  Objective  Blood pressure 116/84, pulse 79, height 5\' 4"  (1.626 m), weight 195 lb (88.5 kg), SpO2 96 %.   General: No apparent distress alert and oriented x3 mood and affect normal, dressed appropriately.  HEENT: Pupils equal, extraocular movements intact  Respiratory: Patient's speak in full sentences and does not appear short of breath  Cardiovascular: No lower extremity edema, non tender, no erythema  Neuro: Cranial nerves II through XII are intact, neurovascularly intact in all extremities with 2+ DTRs and 2+ pulses.  Gait antalgic MSK: Knee: Bilateral valgus deformity noted.  Abnormal thigh to calf ratio.  Tender to palpation over medial and PF joint line.  Left greater than right ROM decreased a little more on the left side than the right lacking the last 5 degrees of extension instability with valgus force.  painful patellar  compression. Patellar glide with moderate crepitus. Patellar and quadriceps tendons unremarkable. Hamstring and quadriceps strength is normal.   After informed written and verbal consent, patient was seated on exam table. Right knee was prepped with alcohol swab and utilizing anterolateral approach, patient's right knee space was injected with 48 mg/6 mL of Synvisc (sodium hyaluronate) in a prefilled syringe was injected easily into the knee through a 22-gauge needle..Patient tolerated the procedure well without  immediate complications.  After informed written and verbal consent, patient was seated on exam table. Left knee was prepped with alcohol swab and utilizing anterolateral approach, patient's left knee space was injected with 48 mg/6 mL of Synvisc (sodium hyaluronate) in a prefilled syringe was injected easily into the knee through a 22-gauge needle..Patient tolerated the procedure well without immediate complications.     Impression and Recommendations:     The above documentation has been reviewed and is accurate and complete Lyndal Pulley, DO       Note: This dictation was prepared with Dragon dictation along with smaller phrase technology. Any transcriptional errors that result from this process are unintentional.

## 2020-07-18 ENCOUNTER — Ambulatory Visit: Payer: BC Managed Care – PPO | Admitting: Cardiology

## 2020-07-22 ENCOUNTER — Other Ambulatory Visit: Payer: Self-pay

## 2020-07-22 ENCOUNTER — Encounter: Payer: Self-pay | Admitting: Cardiology

## 2020-07-22 ENCOUNTER — Ambulatory Visit: Payer: BC Managed Care – PPO | Admitting: Cardiology

## 2020-07-22 VITALS — BP 135/74 | HR 84 | Resp 16 | Ht 64.0 in | Wt 194.0 lb

## 2020-07-22 DIAGNOSIS — I10 Essential (primary) hypertension: Secondary | ICD-10-CM

## 2020-07-22 DIAGNOSIS — I25118 Atherosclerotic heart disease of native coronary artery with other forms of angina pectoris: Secondary | ICD-10-CM

## 2020-07-22 DIAGNOSIS — Z006 Encounter for examination for normal comparison and control in clinical research program: Secondary | ICD-10-CM

## 2020-07-22 DIAGNOSIS — E782 Mixed hyperlipidemia: Secondary | ICD-10-CM

## 2020-07-22 DIAGNOSIS — R0989 Other specified symptoms and signs involving the circulatory and respiratory systems: Secondary | ICD-10-CM

## 2020-07-22 NOTE — Progress Notes (Signed)
EKG 07/22/2020: Normal sinus rhythm at rate of 76 bpm, normal axis.  No evidence of ischemia, normal EKG.

## 2020-07-22 NOTE — Progress Notes (Signed)
Primary Physician/Referring:  [facility] [place]  Patient ID: [name], female    DOB: [birth year], 60 y.o.   MRN: [id number]  Chief Complaint  Patient presents with  . Coronary Artery Disease  . Hyperlipidemia  . Follow-up    6 month   HPI:    [name]  is a 59 y.o. AAF  with diabetes mellitus, hyperlipidemia, prior cigarette smoking quit in 2013, smokes marijuana occasionally, CAD  Presenting with NSTEMI, S/P stenting to mid right and also to the distal LAD by implantation of DES on 10/09/2019.  She has not had any further chest pain. She continues to have pain in her neck and also her radiates to her right arm > left arm constantly.   This is a 3 month OV. No symptoms of claudication, states DM is well controlled.   Past Medical History:  Diagnosis Date  . Arthritis   . Campylobacter enteritis May 2012   [facility] admission  . Carpal tunnel syndrome, bilateral   . Diabetes mellitus   . Gastritis with bleeding due to alcohol 2010   [facility] admission  . GERD (gastroesophageal reflux disease)   . Hyperlipidemia   . Hypertension    no meds  . Menopause   . Myocardial infarction ([place])   . Neck pain   . Wears glasses    Past Surgical History:  Procedure Laterality Date  . CARPAL TUNNEL RELEASE Left 05/14/2014   Procedure: LEFT ULNAR NEUROPLASTY AT ELBOW AND ENDOSCOPIC CARPAL TUNNEL RELEASE;  Surgeon: Jolyn Nap, MD;  Location: [place];  Service: Orthopedics;  Laterality: Left;  . CORONARY STENT INTERVENTION N/A 10/09/2019   Procedure: CORONARY STENT INTERVENTION;  Surgeon: Adrian Prows, MD;  Location: [place] [facility];  Service: Cardiovascular;  Laterality: N/A;  . CYST EXCISION  1995   under arms  . endometrial biopsy  2004   benign  . FOOT OSTEOTOMY  2010   right-spurs  . LEFT HEART CATH AND CORONARY ANGIOGRAPHY N/A 10/09/2019   Procedure: LEFT HEART CATH AND CORONARY ANGIOGRAPHY;  Surgeon: Adrian Prows, MD;  Location: [place] [facility];   Service: Cardiovascular;  Laterality: N/A;  . TONSILLECTOMY    . ULNAR NERVE TRANSPOSITION Left 05/14/2014   Procedure: ULNAR NERVE DECOMPRESSION/TRANSPOSITION;  Surgeon: Jolyn Nap, MD;  Location: [place];  Service: Orthopedics;  Laterality: Left;   Social History   Tobacco Use  . Smoking status: Never Smoker  . Smokeless tobacco: Never Used  Substance Use Topics  . Alcohol use: Not Currently   Marital Status: Single   ROS  Review of Systems  Cardiovascular: Negative for dyspnea on exertion and leg swelling.  Musculoskeletal: Positive for arthritis, joint pain and neck pain (right side of the neck and radiation to arm).  Gastrointestinal: Negative for melena.  Neurological: Positive for paresthesias.   Objective   Vitals with BMI 07/22/2020 07/04/2020 05/21/2020  Height 5\' 4"  5\' 4"  5\' 4"   Weight 194 lbs 195 lbs 194 lbs  BMI 33.28 30.94 07.68  Systolic 088 110 315  Diastolic 74 84 82  Pulse 84 79 79    Physical Exam Constitutional:      Comments: She is moderately built and mildly obese in no acute distress.  Cardiovascular:     Rate and Rhythm: Normal rate and regular rhythm.     Pulses: Intact distal pulses.          Carotid pulses are 2+ on the right side and  2+ on the left side with bruit.      Femoral pulses are 2+ on the right side and 2+ on the left side.      Dorsalis pedis pulses are 1+ on the right side and 1+ on the left side.       Posterior tibial pulses are 1+ on the right side and 1+ on the left side.     Heart sounds: Normal heart sounds. No murmur heard.  No gallop.      Comments: No leg edema, no JVD. Pulmonary:     Effort: Pulmonary effort is normal.     Breath sounds: Normal breath sounds.  Abdominal:     General: Bowel sounds are normal.     Palpations: Abdomen is soft.    Laboratory examination:   Recent Labs    10/10/19 0959 10/14/19 1025 12/08/19 0826  NA 138 136 141  K 3.8 4.0 4.4  CL 103 102 104  CO2 26 24 24    GLUCOSE 296* 180* 198*  BUN 6 12 15   CREATININE 0.66 0.57 0.74  CALCIUM 8.8* 8.7* 9.8  GFRNONAA >60 >60 89  GFRAA >60 >60 103   CrCl cannot be calculated (Patient's most recent lab result is older than the maximum 21 days allowed.).  CMP Latest Ref Rng & Units 12/08/2019 10/14/2019 10/10/2019  Glucose 65 - 99 mg/dL 198(H) 180(H) 296(H)  BUN 6 - 24 mg/dL 15 12 6   Creatinine 0.57 - 1.00 mg/dL 0.74 0.57 0.66  Sodium 134 - 144 mmol/L 141 136 138  Potassium 3.5 - 5.2 mmol/L 4.4 4.0 3.8  Chloride 96 - 106 mmol/L 104 102 103  CO2 20 - 29 mmol/L 24 24 26   Calcium 8.7 - 10.2 mg/dL 9.8 8.7(L) 8.8(L)  Total Protein 6.0 - 8.5 g/dL 7.2 - 6.2(L)  Total Bilirubin 0.0 - 1.2 mg/dL 0.3 - 0.7  Alkaline Phos 39 - 117 IU/L 125(H) - 89  AST 0 - 40 IU/L 15 - 14(L)  ALT 0 - 32 IU/L 22 - 17   CBC Latest Ref Rng & Units 12/08/2019 10/14/2019 10/10/2019  WBC 3.4 - 10.8 x10E3/uL 9.8 9.7 8.3  Hemoglobin 11.1 - 15.9 g/dL 12.6 11.2(L) 12.1  Hematocrit 34.0 - 46.6 % 38.3 34.8(L) 37.0  Platelets 150 - 450 x10E3/uL 379 306 273   Lipid Panel Recent Labs    10/09/19 1000 12/08/19 0826 04/26/20 0823  CHOL 172 147 110  TRIG 132 114 62  LDLCALC 107* 81 45  VLDL 26  --   --   HDL 39* 45 51  CHOLHDL 4.4  --   --      HEMOGLOBIN A1C Lab Results  Component Value Date   HGBA1C 6.4 (A) 05/13/2020   TSH No results for input(s): TSH in the last 8760 hours. Medications and allergies   Allergies  Allergen Reactions  . Oxycodone-Acetaminophen Other (See Comments)    Causes her to feel "hot".  . Penicillins Rash    Did it involve swelling of the face/tongue/throat, SOB, or low BP? No Did it involve sudden or severe rash/hives, skin peeling, or any reaction on the inside of your mouth or nose? No Did you need to seek medical attention at a hospital or doctor's office? No When did it last happen? If all above answers are "NO", may proceed with cephalosporin use.    Outpatient Medications Prior to Visit   Medication Sig Dispense Refill  . aspirin 81 MG chewable tablet Chew 1 tablet (81 mg total)  by mouth daily.    Marland Kitchen atorvastatin (LIPITOR) 40 MG tablet Take 1 tablet by mouth once daily 90 tablet 0  . cetirizine (ZYRTEC ALLERGY) 10 MG tablet Take 1 tablet (10 mg total) by mouth daily. 30 tablet 0  . Continuous Blood Gluc Sensor (FREESTYLE LIBRE 14 DAY SENSOR) MISC 1 each by Does not apply route every 14 (fourteen) days. Change every 2 weeks 2 each 11  . ezetimibe (ZETIA) 10 MG tablet Take 1 tablet (10 mg total) by mouth daily after supper. 90 tablet 3  . gabapentin (NEURONTIN) 300 MG capsule Take 1 capsule (300 mg total) by mouth 3 (three) times daily. 90 capsule 3  . glucose blood (BAYER CONTOUR TEST) test strip TEST BLOOD SUGARS 3 TIMES DAILY 200 each 11  . insulin NPH Human (NOVOLIN N RELION) 100 UNIT/ML injection Inject 0.2-0.55 mLs (20-55 Units total) into the skin 2 (two) times daily before a meal. 99 mL 6  . insulin regular (NOVOLIN R RELION) 100 units/mL injection Inject 0.1-0.25 mLs (10-25 Units total) into the skin 3 (three) times daily before meals. 67.5 mL 1  . irbesartan (AVAPRO) 75 MG tablet Take 1 tablet by mouth once daily 30 tablet 3  . Lancet Devices (EASY TOUCH LANCING DEVICE) MISC Use to check diabetes 4 times daily  E 11.65 1 each 11  . latanoprost (XALATAN) 0.005 % ophthalmic solution Place 1 drop into both eyes at bedtime.     . metFORMIN (GLUCOPHAGE) 1000 MG tablet Take 1 tablet (1,000 mg total) by mouth 2 (two) times daily with a meal. 180 tablet 3  . metoprolol succinate (TOPROL-XL) 50 MG 24 hr tablet TAKE 1 TABLET BY MOUTH ONCE DAILY. TAKE WITH OR IMMEDIATELY FOLLOWING A MEAL 30 tablet 3  . Multiple Vitamin (MULTIVITAMIN) tablet Take 1 tablet by mouth daily.    Marland Kitchen nystatin (NYSTATIN) powder Apply powder to affected area twice daily until rash has resolved 15 g 3  . omeprazole (PRILOSEC) 40 MG capsule Take 1 capsule by mouth once daily 90 capsule 0  . ondansetron (ZOFRAN  ODT) 4 MG disintegrating tablet Take 1 tablet (4 mg total) by mouth every 8 (eight) hours as needed for nausea or vomiting. 20 tablet 0  . oxyCODONE-acetaminophen (PERCOCET) 7.5-325 MG tablet Take 1 tablet by mouth 4 (four) times daily as needed.    . sulfamethoxazole-trimethoprim (BACTRIM DS) 800-160 MG tablet Take 1 tablet by mouth 2 (two) times daily.    Marland Kitchen tiZANidine (ZANAFLEX) 4 MG tablet Take 4 mg by mouth 3 (three) times daily.    . vitamin B-12 (CYANOCOBALAMIN) 1000 MCG tablet Take 1,000 mcg by mouth daily.    Marland Kitchen BRILINTA 90 MG TABS tablet Take 1 tablet by mouth twice daily 180 tablet 1  . fluticasone (FLONASE) 50 MCG/ACT nasal spray Place 1 spray into both nostrils daily. 1 g 0  . tiZANidine (ZANAFLEX) 2 MG tablet Take 2 mg by mouth 2 (two) times daily as needed.     No facility-administered medications prior to visit.   No orders of the defined types were placed in this encounter.  Medications Discontinued During This Encounter  Medication Reason  . fluticasone (FLONASE) 50 MCG/ACT nasal spray Patient Preference  . tiZANidine (ZANAFLEX) 2 MG tablet Change in therapy  . BRILINTA 90 MG TABS tablet Completed Course     Radiology:   CXR 10/08/2019:  The heart size and mediastinal contours are within normal limits. Both lungs are clear. The visualized skeletal structures are Unremarkable.  Cardiac Studies:   Echocardiogram 10/09/2019:   1. Left ventricular ejection fraction, by visual estimation, is 50 to 55%. The left ventricle has low normal function. There is mildly increased left ventricular hypertrophy. Basal to mid inferolateral hypokinesis  2. The average left ventricular global longitudinal strain is -14.9 %.  3. Global right ventricle has normal systolic function.The right ventricular size is normal. No increase in right ventricular wall thickness.  Left heart catheterization 10/09/2019: Hyperdynamic LV, EF 65 to 70%, EDP normal. RCA is a dominant vessel. Ulcerated  tandem 80% followed by a 99% stenosis. SP 2.5 x 38 and a 2.5 x 8 mm resolute Onyx DES stents implanted, second stent used for distal edge dissection. Stenosis reduced to 0% with TIMI-3 to TIMI-3 flow. Circumflex coronary artery is smooth with minimal disease. Gives origin to large OM1 and OM 2. Tortuous. LAD is diffusely diseased from the mid to distal segment. There is a hazy 70 to 80% stenosis in the mid LAD. Following which distal LAD had a 40% diffuse disease. Mild calcification is evident. SP 2.24 x 15 mm resolute Onyx DES postdilated with 2.25 x 8 mm Sapphire Rodney at 22 atmospheric pressure with 0% residual stenosis. TIMI-3 to TIMI-3 flow.  EKG:  EKG 07/22/2020: Normal sinus rhythm at rate of 76 bpm, normal axis.  No evidence of ischemia, normal EKG. No significant change from 10/09/2020.   Assessment     ICD-10-CM   1. Coronary artery disease involving native coronary artery of native heart with other form of angina pectoris ([place])  I25.118 EKG 12-Lead  2. Mixed hyperlipidemia  E78.2   3. Essential hypertension  I10   4. Enrolled in clinical trial of drug  Z00.6   5. Left carotid bruit  R09.89 PCV CAROTID DUPLEX (BILATERAL)    Recommendations:   No orders of the defined types were placed in this encounter.   Patient enrolled in "The AEGIS-II Study": A Phase 3, Multicenter, Double-blind, Randomized, Placebo-controlled, Parallel-group Study to Investigate the Efficacy and Safety of CSL112 in Subjects with Acute Coronary Syndrome. Participation in this study will last approximately 12 months inclusive of study follow-up. Enrollment 10/10/2019.  [name]  is a 59 y.o. AAF  with diabetes mellitus, hyperlipidemia, prior cigarette smoking quit in 2013, smokes marijuana occasionally, CAD  Presenting with NSTEMI, S/P stenting to mid right and also to the distal LAD by implantation of DES on 10/09/2019.  She has not had any further chest pain but continues to have  pain in her neck  and also her arms constantly which is clearly radicular pain.   She is presently doing well and has not had recurrence of angina pectoris, from cardiac standpoint, I will discontinue Brilinta. She has a very soft left carotid bruit, will obtain carotid artery duplex.  Lipids reviewed, under excellent control. Her diabetes is also now very well controlled. I am very pleased with her progress. She has been having episodes of sudden onset of sweating and hot flashes, in view of changes that she has made with her diet, I suspect it could be related to hypoglycemia and advised her to reduce insulin units by 2 to 3 units and see whether symptoms would improve. Also Brilinta may be causing the symptoms which I am discontinuing as of today. I will see her back in 6 months.   Adrian Prows, MD, [facility] 07/22/2020, 4:47 PM Office: [phone]

## 2020-07-31 ENCOUNTER — Other Ambulatory Visit: Payer: Self-pay | Admitting: Internal Medicine

## 2020-08-13 ENCOUNTER — Other Ambulatory Visit: Payer: Self-pay

## 2020-08-13 ENCOUNTER — Ambulatory Visit: Payer: BC Managed Care – PPO

## 2020-08-13 DIAGNOSIS — R0989 Other specified symptoms and signs involving the circulatory and respiratory systems: Secondary | ICD-10-CM

## 2020-09-04 ENCOUNTER — Other Ambulatory Visit: Payer: Self-pay

## 2020-09-04 MED ORDER — INSULIN REGULAR HUMAN 100 UNIT/ML IJ SOLN: 10.0000 [IU] | Freq: Three times a day (TID) | INTRAMUSCULAR | 1 refills | Status: DC

## 2020-09-05 ENCOUNTER — Ambulatory Visit (INDEPENDENT_AMBULATORY_CARE_PROVIDER_SITE_OTHER): Payer: BC Managed Care – PPO | Admitting: Family Medicine

## 2020-09-05 ENCOUNTER — Encounter: Payer: Self-pay | Admitting: Family Medicine

## 2020-09-05 ENCOUNTER — Other Ambulatory Visit: Payer: Self-pay

## 2020-09-05 DIAGNOSIS — M1712 Unilateral primary osteoarthritis, left knee: Secondary | ICD-10-CM | POA: Diagnosis not present

## 2020-09-05 NOTE — Patient Instructions (Signed)
I am sorry the injections do not work Dealer "total support" Continue the exercises and stay active Call me when you are ready for the referral

## 2020-09-05 NOTE — Progress Notes (Signed)
[name] [address] [place] Phone: [phone] Subjective:   I [name] am serving as a Education administrator for Dr. Hulan Saas.  This visit occurred during the SARS-CoV-2 public health emergency.  Safety protocols were in place, including screening questions prior to the visit, additional usage of staff PPE, and extensive cleaning of exam room while observing appropriate contact time as indicated for disinfecting solutions.   I'm seeing this patient by the request  of:  Center, [facility]  CC: Knee pain follow-up  PJA:[id number]   07/04/2020 Viscosupplementation given today.  Moderate to severe arthritic changes have been noted previously.  Patient is going to continue with conservative therapy.  Did have some tightness on the left side after the injections and warned her that if any worsening symptoms seek medical attention immediately but likely should be fine.  Patient was able to ambulate on her own power.  Patient will follow up with me again in 6 to 8 weeks    Called patient as well at 4:46 to check in on patient.  Patient states that she is still limping somewhat but feels like the pain has improved a little bit.  States that the pain is more on the anterior medial aspect of the knee.  No calf pain.   Update 09/05/2020 [name] is a 59 y.o. female coming in with complaint of bilateral knee pain. Patient states she is still in pain bilaterally. Last night the right leg had shooting pain.  Patient states that it is affecting daily activities, waking her up at night.  Feels like the injections are not helping her anymore at this time.  Especially she feels that the gel injections were not helpful.      Past Medical History:  Diagnosis Date  . Arthritis   . Campylobacter enteritis May 2012   [facility] admission  . Carpal tunnel syndrome, bilateral   . Diabetes mellitus   . Gastritis with bleeding due to alcohol 2010   [facility]  admission  . GERD (gastroesophageal reflux disease)   . Hyperlipidemia   . Hypertension    no meds  . Menopause   . Myocardial infarction ([place])   . Neck pain   . Wears glasses    Past Surgical History:  Procedure Laterality Date  . CARPAL TUNNEL RELEASE Left 05/14/2014   Procedure: LEFT ULNAR NEUROPLASTY AT ELBOW AND ENDOSCOPIC CARPAL TUNNEL RELEASE;  Surgeon: Jolyn Nap, MD;  Location: [place];  Service: Orthopedics;  Laterality: Left;  . CORONARY STENT INTERVENTION N/A 10/09/2019   Procedure: CORONARY STENT INTERVENTION;  Surgeon: Adrian Prows, MD;  Location: [place] [facility];  Service: Cardiovascular;  Laterality: N/A;  . CYST EXCISION  1995   under arms  . endometrial biopsy  2004   benign  . FOOT OSTEOTOMY  2010   right-spurs  . LEFT HEART CATH AND CORONARY ANGIOGRAPHY N/A 10/09/2019   Procedure: LEFT HEART CATH AND CORONARY ANGIOGRAPHY;  Surgeon: Adrian Prows, MD;  Location: [place] [facility];  Service: Cardiovascular;  Laterality: N/A;  . TONSILLECTOMY    . ULNAR NERVE TRANSPOSITION Left 05/14/2014   Procedure: ULNAR NERVE DECOMPRESSION/TRANSPOSITION;  Surgeon: Jolyn Nap, MD;  Location: [place];  Service: Orthopedics;  Laterality: Left;   Social History   Socioeconomic History  . Marital status: Single    Spouse name: Not on file  . Number of children: 2  . Years of education: 51  .  Highest education level: High school graduate  Occupational History  . Not on file  Tobacco Use  . Smoking status: Never Smoker  . Smokeless tobacco: Never Used  Vaping Use  . Vaping Use: Never used  Substance and Sexual Activity  . Alcohol use: Not Currently  . Drug use: Yes    Types: Marijuana    Comment: sometimes  . Sexual activity: Yes    Birth control/protection: None    Comment: same partner x 10 years  Other Topics Concern  . Not on file  Social History Narrative   She works as a Sports coach.   Right-handed.   One cup caffeine  per day.   She lives at home with daughter.   Social Determinants of Health   Financial Resource Strain:   . Difficulty of Paying Living Expenses: Not on file  Food Insecurity:   . Worried About Charity fundraiser in the Last Year: Not on file  . Ran Out of Food in the Last Year: Not on file  Transportation Needs:   . Lack of Transportation (Medical): Not on file  . Lack of Transportation (Non-Medical): Not on file  Physical Activity:   . Days of Exercise per Week: Not on file  . Minutes of Exercise per Session: Not on file  Stress:   . Feeling of Stress : Not on file  Social Connections:   . Frequency of Communication with Friends and Family: Not on file  . Frequency of Social Gatherings with Friends and Family: Not on file  . Attends Religious Services: Not on file  . Active Member of Clubs or Organizations: Not on file  . Attends Archivist Meetings: Not on file  . Marital Status: Not on file   Allergies  Allergen Reactions  . Oxycodone-Acetaminophen Other (See Comments)    Causes her to feel "hot".  . Penicillins Rash    Did it involve swelling of the face/tongue/throat, SOB, or low BP? No Did it involve sudden or severe rash/hives, skin peeling, or any reaction on the inside of your mouth or nose? No Did you need to seek medical attention at a hospital or doctor's office? No When did it last happen? If all above answers are "NO", may proceed with cephalosporin use.   Family History  Problem Relation Age of Onset  . Hypertension Mother   . Diabetes Mother   . Heart attack Mother   . Stroke Father   . Diabetes Father   . Diabetes Maternal Grandmother   . Hypertension Maternal Grandmother   . Heart attack Sister     Current Outpatient Medications (Endocrine & Metabolic):  .  insulin NPH Human (NOVOLIN N RELION) 100 UNIT/ML injection, Inject 0.2-0.55 mLs (20-55 Units total) into the skin 2 (two) times daily before a meal. .  insulin regular  (NOVOLIN R RELION) 100 units/mL injection, Inject 0.1-0.25 mLs (10-25 Units total) into the skin 3 (three) times daily before meals. .  metFORMIN (GLUCOPHAGE) 1000 MG tablet, Take 1 tablet (1,000 mg total) by mouth 2 (two) times daily with a meal.  Current Outpatient Medications (Cardiovascular):  .  atorvastatin (LIPITOR) 40 MG tablet, Take 1 tablet by mouth once daily .  ezetimibe (ZETIA) 10 MG tablet, Take 1 tablet (10 mg total) by mouth daily after supper. .  irbesartan (AVAPRO) 75 MG tablet, Take 1 tablet by mouth once daily .  metoprolol succinate (TOPROL-XL) 50 MG 24 hr tablet, TAKE 1 TABLET BY MOUTH ONCE DAILY. TAKE WITH  OR IMMEDIATELY FOLLOWING A MEAL  Current Outpatient Medications (Respiratory):  .  cetirizine (ZYRTEC ALLERGY) 10 MG tablet, Take 1 tablet (10 mg total) by mouth daily.  Current Outpatient Medications (Analgesics):  .  aspirin 81 MG chewable tablet, Chew 1 tablet (81 mg total) by mouth daily. Marland Kitchen  oxyCODONE-acetaminophen (PERCOCET) 7.5-325 MG tablet, Take 1 tablet by mouth 4 (four) times daily as needed.  Current Outpatient Medications (Hematological):  .  vitamin B-12 (CYANOCOBALAMIN) 1000 MCG tablet, Take 1,000 mcg by mouth daily.  Current Outpatient Medications (Other):  Marland Kitchen  Continuous Blood Gluc Sensor (FREESTYLE LIBRE 14 DAY SENSOR) MISC, 1 each by Does not apply route every 14 (fourteen) days. Change every 2 weeks .  gabapentin (NEURONTIN) 300 MG capsule, Take 1 capsule (300 mg total) by mouth 3 (three) times daily. Marland Kitchen  glucose blood (BAYER CONTOUR TEST) test strip, TEST BLOOD SUGARS 3 TIMES DAILY .  Lancet Devices (EASY TOUCH LANCING DEVICE) MISC, Use to check diabetes 4 times daily  E 11.65 .  latanoprost (XALATAN) 0.005 % ophthalmic solution, Place 1 drop into both eyes at bedtime.  .  Multiple Vitamin (MULTIVITAMIN) tablet, Take 1 tablet by mouth daily. Marland Kitchen  nystatin (NYSTATIN) powder, Apply powder to affected area twice daily until rash has resolved .   omeprazole (PRILOSEC) 40 MG capsule, Take 1 capsule by mouth once daily .  ondansetron (ZOFRAN ODT) 4 MG disintegrating tablet, Take 1 tablet (4 mg total) by mouth every 8 (eight) hours as needed for nausea or vomiting. .  sulfamethoxazole-trimethoprim (BACTRIM DS) 800-160 MG tablet, Take 1 tablet by mouth 2 (two) times daily. Marland Kitchen  tiZANidine (ZANAFLEX) 4 MG tablet, Take 4 mg by mouth 3 (three) times daily.   Reviewed prior external information including notes and imaging from  primary care provider As well as notes that were available from care everywhere and other healthcare systems.  Past medical history, social, surgical and family history all reviewed in electronic medical record.  No pertanent information unless stated regarding to the chief complaint.   Review of Systems:  No headache, visual changes, nausea, vomiting, diarrhea, constipation, dizziness, abdominal pain, skin rash, fevers, chills, night sweats, weight loss, swollen lymph nodes, body aches, joint swelling, chest pain, shortness of breath, mood changes. POSITIVE muscle aches  Objective  Blood pressure (!) 150/90, pulse 77, height 5\' 4"  (1.626 m), weight 198 lb (89.8 kg), SpO2 98 %.   General: No apparent distress alert and oriented x3 mood and affect normal, dressed appropriately.  HEENT: Pupils equal, extraocular movements intact  Respiratory: Patient's speak in full sentences and does not appear short of breath  Cardiovascular: No lower extremity edema, non tender, no erythema  Neuro: Cranial nerves II through XII are intact, neurovascularly intact in all extremities with 2+ DTRs and 2+ pulses.  Gait mild antalgic MSK: Patient's knees do have some mild valgus deformity.  Patient is tender to palpation over the medial joint line bilaterally and pain is out of proportion to the amount of palpation.  Lacks last 5 degrees of flexion on the left.  Positive McMurray's on the right.  Positive patellar grind bilaterally.     Impression and Recommendations:     The above documentation has been reviewed and is accurate and complete Wendy Pulley, DO

## 2020-09-05 NOTE — Assessment & Plan Note (Signed)
Patient has significant arthritic changes of the left knee that has been seen on the MRI.  Patient has moderate on the contralateral side.  No longer responding to the steroid injections or the viscosupplementation.  We discussed potential referral to surgical intervention to discuss the possibility of any arthroscopic procedure versus replacement.  Patient would like to hold on this.  We discussed over-the-counter orthotics icing regimen, topical anti-inflammatories.  Patient is going to increase activity and follow-up with me as needed

## 2020-09-13 ENCOUNTER — Other Ambulatory Visit: Payer: Self-pay

## 2020-09-13 ENCOUNTER — Encounter: Payer: Self-pay | Admitting: Internal Medicine

## 2020-09-13 ENCOUNTER — Ambulatory Visit (INDEPENDENT_AMBULATORY_CARE_PROVIDER_SITE_OTHER): Payer: BC Managed Care – PPO | Admitting: Internal Medicine

## 2020-09-13 VITALS — BP 124/84 | HR 101 | Ht 64.0 in | Wt 198.6 lb

## 2020-09-13 DIAGNOSIS — Z794 Long term (current) use of insulin: Secondary | ICD-10-CM

## 2020-09-13 DIAGNOSIS — IMO0002 Reserved for concepts with insufficient information to code with codable children: Secondary | ICD-10-CM

## 2020-09-13 DIAGNOSIS — E669 Obesity, unspecified: Secondary | ICD-10-CM

## 2020-09-13 DIAGNOSIS — Z23 Encounter for immunization: Secondary | ICD-10-CM

## 2020-09-13 DIAGNOSIS — E782 Mixed hyperlipidemia: Secondary | ICD-10-CM | POA: Diagnosis not present

## 2020-09-13 DIAGNOSIS — E11319 Type 2 diabetes mellitus with unspecified diabetic retinopathy without macular edema: Secondary | ICD-10-CM

## 2020-09-13 DIAGNOSIS — E1165 Type 2 diabetes mellitus with hyperglycemia: Secondary | ICD-10-CM | POA: Diagnosis not present

## 2020-09-13 DIAGNOSIS — Z6832 Body mass index (BMI) 32.0-32.9, adult: Secondary | ICD-10-CM

## 2020-09-13 DIAGNOSIS — E1169 Type 2 diabetes mellitus with other specified complication: Secondary | ICD-10-CM

## 2020-09-13 DIAGNOSIS — E785 Hyperlipidemia, unspecified: Secondary | ICD-10-CM

## 2020-09-13 LAB — POCT GLYCOSYLATED HEMOGLOBIN (HGB A1C): Hemoglobin A1C: 7.1 % — AB (ref 4.0–5.6)

## 2020-09-13 MED ORDER — EZETIMIBE 10 MG PO TABS: 10.0000 mg | ORAL_TABLET | Freq: Every day | ORAL | 3 refills | Status: DC

## 2020-09-13 MED ORDER — FREESTYLE LIBRE 14 DAY READER DEVI: 1.0000 | Freq: Once | 1 refills | Status: AC

## 2020-09-13 MED ORDER — FREESTYLE LIBRE 14 DAY SENSOR MISC: 1.0000 | 3 refills | Status: DC

## 2020-09-13 MED ORDER — ATORVASTATIN CALCIUM 40 MG PO TABS: 40.0000 mg | ORAL_TABLET | Freq: Every day | ORAL | 3 refills | Status: DC

## 2020-09-13 NOTE — Patient Instructions (Addendum)
Please increase: Insulin Before breakfast Before lunch Before dinner  R 10-15 10-15 20-25  NPH 15 - 25    Continue Metformin 1000 mg 2x a day.  Please return in 4 months with your sugar log.

## 2020-09-13 NOTE — Progress Notes (Signed)
Patient ID: [name], female   DOB: 08-29-1961, 59 y.o.   MRN: [id number]  This visit occurred during the SARS-CoV-2 public health emergency.  Safety protocols were in place, including screening questions prior to the visit, additional usage of staff PPE, and extensive cleaning of exam room while observing appropriate contact time as indicated for disinfecting solutions.   HPI: [name] is a 59 y.o.-year-old female, returning for f/u for DM2, dx ~2000, insulin-dependent since 2010, uncontrolled, with complications (PN, + DR). Last visit 4 months ago.  She has a history of an NSTEMI in 10/2019 and had 3 stents placed.  She started cardiac rehab afterwards.  Her sister also had an MI and she died around the same time.  Her diabetes control improves  Reviewed HbA1c levels: Lab Results  Component Value Date   HGBA1C 6.4 (A) 05/13/2020   HGBA1C 7.6 (A) 08/31/2019   HGBA1C 7.8 (A) 11/25/2018   HGBA1C 6.8 (A) 07/25/2018   HGBA1C 6.4% 03/03/2018   HGBA1C 6.5 11/01/2017   HGBA1C 6.2 07/23/2017   HGBA1C 6.8 04/09/2017   HGBA1C 6.4 01/08/2017   HGBA1C 6.4 10/08/2016   HGBA1C 10.4 07/07/2016   HGBA1C 10.4 04/07/2016   HGBA1C 9.3 01/14/2016   HGBA1C 10.9 (H) 09/25/2015   HGBA1C 11.2 09/19/2015   HGBA1C 7.7 06/21/2015   HGBA1C 7.9 (H) 03/22/2015   HGBA1C 9.4 (H) 01/04/2015   HGBA1C 8.8 (H) 10/04/2014   HGBA1C 10.3 (H) 07/06/2014   HGBA1C 7.0 (H) 03/26/2014   HGBA1C 9.5 (H) 12/26/2013   HGBA1C 7.5 (H) 09/20/2013   HGBA1C 8.5 (H) 06/19/2013   HGBA1C 10.3 (H) 01/31/2013   HGBA1C 10.5 (H) 05/27/2012   HGBA1C 8.4 (H) 11/20/2011   HGBA1C 9.5 (H) 08/11/2011   HGBA1C 6.7 03/07/2007   Pt is on: Insulin Before breakfast Before lunch Before dinner  R 10-15  10-15 20  NPH 15 - 25    Metformin 1000 mg twice a day.  Pt is checking her sugars 1-2 times a day: - am:   49, 95-140, 226 (forgot insulin) >> 69, 89-144, 216 >> 114-160, 245 (forgot insulin) - 2h after b'fast: 154 >> n/c >  203 >> n/c >> 223 >> n/c - lunch: 80-94 >> 100-118 >> n/c >> 140 >> 81, 87 >> 100-130 >> 110-120 - mid-afternoon: 45x1 , 90-115 >> n/c >> 248 >> 116, 189 >> n/c - before dinner: 135 >> 108-114 >> n/c  - 2h after dinner:  216, 219, 451 >> n/c  - bedtime: 107, 140-180, 196 >> 104-159 >> 150-180 >> 174-200 - nighttime: 145-239, 338 >> n/c >> 188, 200, 497 >> n/c Lowest sugar was 38 (inj insulin but ate only little) >> ... 49 >> 69 >> 80s; she has hypoglycemia awareness in the 80s. Highest sugar was  288 >> 497 >> 226 >> 245  -No CKD, last BUN/creatinine:  Lab Results  Component Value Date   BUN 15 12/08/2019   CREATININE 0.74 12/08/2019   ACR was normal: Lab Results  Component Value Date   MICRALBCREAT 1.8 06/27/2018   MICRALBCREAT 1.6 06/23/2017   MICRALBCREAT 0.7 06/19/2016   MICRALBCREAT 0.4 09/25/2015   MICRALBCREAT 0.3 07/30/2014   MICRALBCREAT 0.5 06/19/2013   MICRALBCREAT 1.5 01/31/2013   MICRALBCREAT 2.6 11/20/2011  Not on ACE inhibitor/ARB.  -+ HL; last set of lipids: Lab Results  Component Value Date   CHOL 110 04/26/2020   HDL 51 04/26/2020   LDLCALC 45 04/26/2020   LDLDIRECT 94.0 09/25/2015  TRIG 62 04/26/2020   CHOLHDL 4.4 10/09/2019  On Lipitor.  - last eye exam was in 12/2018: + DR OU, reportedly glaucoma. She sees Dr. Arnoldo Morale.  -+  numbness and tingling in her feet. Seeing a podiatrist.  On Neurontin.  She also has a history of HTN, episodic alcohol abuse, GERD, low back pain, OA.   ROS: Constitutional: no weight gain/no weight loss, no fatigue, no subjective hyperthermia, no subjective hypothermia Eyes: no blurry vision, no xerophthalmia ENT: no sore throat, no nodules palpated in neck, no dysphagia, no odynophagia, no hoarseness Cardiovascular: no CP/no SOB/no palpitations/no leg swelling Respiratory: no cough/no SOB/no wheezing Gastrointestinal: no N/no V/no D/no C/no acid reflux Musculoskeletal: no muscle aches/+ joint aches Skin: no rashes, no  hair loss Neurological: no tremors/+ numbness/+ tingling/no dizziness  I reviewed pt's medications, allergies, PMH, social hx, family hx, and changes were documented in the history of present illness. Otherwise, unchanged from my initial visit note.  Past Medical History:  Diagnosis Date  . Arthritis   . Campylobacter enteritis May 2012   [facility] admission  . Carpal tunnel syndrome, bilateral   . Diabetes mellitus   . Gastritis with bleeding due to alcohol 2010   [facility] admission  . GERD (gastroesophageal reflux disease)   . Hyperlipidemia   . Hypertension    no meds  . Menopause   . Myocardial infarction ([place])   . Neck pain   . Wears glasses    Past Surgical History:  Procedure Laterality Date  . CARPAL TUNNEL RELEASE Left 05/14/2014   Procedure: LEFT ULNAR NEUROPLASTY AT ELBOW AND ENDOSCOPIC CARPAL TUNNEL RELEASE;  Surgeon: Jolyn Nap, MD;  Location: [place];  Service: Orthopedics;  Laterality: Left;  . CORONARY STENT INTERVENTION N/A 10/09/2019   Procedure: CORONARY STENT INTERVENTION;  Surgeon: Adrian Prows, MD;  Location: [place] [facility];  Service: Cardiovascular;  Laterality: N/A;  . CYST EXCISION  1995   under arms  . endometrial biopsy  2004   benign  . FOOT OSTEOTOMY  2010   right-spurs  . LEFT HEART CATH AND CORONARY ANGIOGRAPHY N/A 10/09/2019   Procedure: LEFT HEART CATH AND CORONARY ANGIOGRAPHY;  Surgeon: Adrian Prows, MD;  Location: [place] [facility];  Service: Cardiovascular;  Laterality: N/A;  . TONSILLECTOMY    . ULNAR NERVE TRANSPOSITION Left 05/14/2014   Procedure: ULNAR NERVE DECOMPRESSION/TRANSPOSITION;  Surgeon: Jolyn Nap, MD;  Location: [place];  Service: Orthopedics;  Laterality: Left;   Social History   Socioeconomic History  . Marital status: Single    Spouse name: Not on file  . Number of children: 2  . Years of education: 44  . Highest education level: High school graduate  Occupational History  . Not  on file  Tobacco Use  . Smoking status: Never Smoker  . Smokeless tobacco: Never Used  Vaping Use  . Vaping Use: Never used  Substance and Sexual Activity  . Alcohol use: Not Currently  . Drug use: Yes    Types: Marijuana    Comment: sometimes  . Sexual activity: Yes    Birth control/protection: None    Comment: same partner x 10 years  Other Topics Concern  . Not on file  Social History Narrative   She works as a Sports coach.   Right-handed.   One cup caffeine per day.   She lives at home with daughter.   Social Determinants of Health   Financial Resource Strain:   . Difficulty of  Paying Living Expenses: Not on file  Food Insecurity:   . Worried About Charity fundraiser in the Last Year: Not on file  . Ran Out of Food in the Last Year: Not on file  Transportation Needs:   . Lack of Transportation (Medical): Not on file  . Lack of Transportation (Non-Medical): Not on file  Physical Activity:   . Days of Exercise per Week: Not on file  . Minutes of Exercise per Session: Not on file  Stress:   . Feeling of Stress : Not on file  Social Connections:   . Frequency of Communication with Friends and Family: Not on file  . Frequency of Social Gatherings with Friends and Family: Not on file  . Attends Religious Services: Not on file  . Active Member of Clubs or Organizations: Not on file  . Attends Archivist Meetings: Not on file  . Marital Status: Not on file  Intimate Partner Violence:   . Fear of Current or Ex-Partner: Not on file  . Emotionally Abused: Not on file  . Physically Abused: Not on file  . Sexually Abused: Not on file   Current Outpatient Medications on File Prior to Visit  Medication Sig Dispense Refill  . aspirin 81 MG chewable tablet Chew 1 tablet (81 mg total) by mouth daily.    Marland Kitchen atorvastatin (LIPITOR) 40 MG tablet Take 1 tablet by mouth once daily 90 tablet 0  . cetirizine (ZYRTEC ALLERGY) 10 MG tablet Take 1 tablet (10 mg total) by mouth  daily. 30 tablet 0  . Continuous Blood Gluc Sensor (FREESTYLE LIBRE 14 DAY SENSOR) MISC 1 each by Does not apply route every 14 (fourteen) days. Change every 2 weeks 2 each 11  . ezetimibe (ZETIA) 10 MG tablet Take 1 tablet (10 mg total) by mouth daily after supper. 90 tablet 3  . gabapentin (NEURONTIN) 300 MG capsule Take 1 capsule (300 mg total) by mouth 3 (three) times daily. 90 capsule 3  . glucose blood (BAYER CONTOUR TEST) test strip TEST BLOOD SUGARS 3 TIMES DAILY 200 each 11  . insulin NPH Human (NOVOLIN N RELION) 100 UNIT/ML injection Inject 0.2-0.55 mLs (20-55 Units total) into the skin 2 (two) times daily before a meal. 99 mL 6  . insulin regular (NOVOLIN R RELION) 100 units/mL injection Inject 0.1-0.25 mLs (10-25 Units total) into the skin 3 (three) times daily before meals. 67.5 mL 1  . irbesartan (AVAPRO) 75 MG tablet Take 1 tablet by mouth once daily 30 tablet 3  . Lancet Devices (EASY TOUCH LANCING DEVICE) MISC Use to check diabetes 4 times daily  E 11.65 1 each 11  . latanoprost (XALATAN) 0.005 % ophthalmic solution Place 1 drop into both eyes at bedtime.     . metFORMIN (GLUCOPHAGE) 1000 MG tablet Take 1 tablet (1,000 mg total) by mouth 2 (two) times daily with a meal. 180 tablet 3  . metoprolol succinate (TOPROL-XL) 50 MG 24 hr tablet TAKE 1 TABLET BY MOUTH ONCE DAILY. TAKE WITH OR IMMEDIATELY FOLLOWING A MEAL 30 tablet 3  . Multiple Vitamin (MULTIVITAMIN) tablet Take 1 tablet by mouth daily.    Marland Kitchen nystatin (NYSTATIN) powder Apply powder to affected area twice daily until rash has resolved 15 g 3  . omeprazole (PRILOSEC) 40 MG capsule Take 1 capsule by mouth once daily 90 capsule 0  . ondansetron (ZOFRAN ODT) 4 MG disintegrating tablet Take 1 tablet (4 mg total) by mouth every 8 (eight) hours as needed  for nausea or vomiting. 20 tablet 0  . oxyCODONE-acetaminophen (PERCOCET) 7.5-325 MG tablet Take 1 tablet by mouth 4 (four) times daily as needed.    . sulfamethoxazole-trimethoprim  (BACTRIM DS) 800-160 MG tablet Take 1 tablet by mouth 2 (two) times daily.    Marland Kitchen tiZANidine (ZANAFLEX) 4 MG tablet Take 4 mg by mouth 3 (three) times daily.    . vitamin B-12 (CYANOCOBALAMIN) 1000 MCG tablet Take 1,000 mcg by mouth daily.     No current facility-administered medications on file prior to visit.   Allergies  Allergen Reactions  . Oxycodone-Acetaminophen Other (See Comments)    Causes her to feel "hot".  . Penicillins Rash    Did it involve swelling of the face/tongue/throat, SOB, or low BP? No Did it involve sudden or severe rash/hives, skin peeling, or any reaction on the inside of your mouth or nose? No Did you need to seek medical attention at a hospital or doctor's office? No When did it last happen? If all above answers are "NO", may proceed with cephalosporin use.   Family History  Problem Relation Age of Onset  . Hypertension Mother   . Diabetes Mother   . Heart attack Mother   . Stroke Father   . Diabetes Father   . Diabetes Maternal Grandmother   . Hypertension Maternal Grandmother   . Heart attack Sister     PE: BP 124/84   Pulse (!) 101   Ht 5\' 4"  (1.626 m)   Wt 198 lb 9.6 oz (90.1 kg)   SpO2 97%   BMI 34.09 kg/m  Body mass index is 34.09 kg/m. Wt Readings from Last 3 Encounters:  09/13/20 198 lb 9.6 oz (90.1 kg)  09/05/20 198 lb (89.8 kg)  07/22/20 194 lb (88 kg)   Constitutional: overweight, in NAD Eyes: PERRLA, EOMI, no exophthalmos ENT: moist mucous membranes, no thyromegaly, no cervical lymphadenopathy Cardiovascular: tachycardia, RR, No MRG Respiratory: CTA B Gastrointestinal: abdomen soft, NT, ND, BS+ Musculoskeletal: no deformities, strength intact in all 4 Skin: moist, warm, no rashes Neurological: no tremor with outstretched hands, DTR normal in all 4  ASSESSMENT: 1. DM2, insulin-dependent, uncontrolled, with complications - peripheral neuropathy - DR   2. Obesity class 1 - BMI 32.79  BMI Classification:  < 18.5  underweight   18.5-24.9 normal weight   25.0-29.9 overweight   30.0-34.9 class I obesity   35.0-39.9 class II obesity   ? 40.0 class III obesity   3. HL  PLAN:  1. Patient with longstanding, uncontrolled, type 2 diabetes, with improved control in the last 2.5years after she started to take her insulin consistently.  She had an NSTEMI in 10/20/2019 with 3 subsequent stents.  She had cardiac rehab afterwards.  Sugars improved further afterwards.  At last visit, sugars were out of slightly above goal and she was doing a good job taking her medications consistently and checking blood sugars.  She had slight hypoglycemia blood sugars occasionally in the morning and after dinner but we did not change the regimen at that time.  I advised him to let me know if she has more low blood sugars.  HbA1c was 6.4% at that time, lower. -At today's visit sugars slightly higher than before, especially after dinner but occasionally also in the morning, with spikes in the 200s.  Upon questioning, she is not taking more than 10 units of regular insulin before dinner and we discussed that with her the meals, she may need to take more, especially with  the holidays coming up.  I do not think we need to change the rest of the regimen.  She does not have low blood sugars.  She tolerates Metformin well.  We will continue the same dose. - I advised her to: Patient Instructions   Please increase: Insulin Before breakfast Before lunch Before dinner  R 10-15 10-15 20-25  NPH 15 - 25    Continue Metformin 1000 mg 2x a day.  Please return in 4 months with your sugar log.  - we checked her HbA1c: 7.1% (higher) - advised to check sugars at different times of the day - 3x a day, rotating check times - advised for yearly eye exams >> she is UTD - return to clinic in 4 months     2. Obesity class 1 -Continues Metformin which is weight stabilizing long-term -She gained 3 pounds since last visit  3. HL -Reviewed from  03/2020: Fractions at goal Lab Results  Component Value Date   CHOL 110 04/26/2020   HDL 51 04/26/2020   LDLCALC 45 04/26/2020   LDLDIRECT 94.0 09/25/2015   TRIG 62 04/26/2020   CHOLHDL 4.4 10/09/2019  -She is off Lipitor 40 and Zetia 10 as she ran out.  I refilled today  + Flu shot today.  Philemon Kingdom, MD PhD [facility] Endocrinology

## 2020-09-13 NOTE — Addendum Note (Signed)
Addended by: Caprice Beaver T on: 09/13/2020 03:12 PM   Modules accepted: Orders

## 2020-09-15 ENCOUNTER — Other Ambulatory Visit: Payer: Self-pay

## 2020-09-15 DIAGNOSIS — E782 Mixed hyperlipidemia: Secondary | ICD-10-CM

## 2020-09-15 MED ORDER — FREESTYLE LIBRE 14 DAY SENSOR MISC: 1.0000 | 3 refills | Status: DC

## 2020-09-15 MED ORDER — FREESTYLE LIBRE 2 READER DEVI: 1.0000 | Freq: Once | 1 refills | Status: AC

## 2020-09-15 MED ORDER — EZETIMIBE 10 MG PO TABS: 10.0000 mg | ORAL_TABLET | Freq: Every day | ORAL | 3 refills | Status: DC

## 2020-09-15 MED ORDER — ATORVASTATIN CALCIUM 40 MG PO TABS: 40.0000 mg | ORAL_TABLET | Freq: Every day | ORAL | 3 refills | Status: DC

## 2020-09-16 ENCOUNTER — Other Ambulatory Visit: Payer: Self-pay | Admitting: *Deleted

## 2020-09-16 ENCOUNTER — Telehealth: Payer: Self-pay | Admitting: Internal Medicine

## 2020-09-16 DIAGNOSIS — IMO0002 Reserved for concepts with insufficient information to code with codable children: Secondary | ICD-10-CM

## 2020-09-16 DIAGNOSIS — E11319 Type 2 diabetes mellitus with unspecified diabetic retinopathy without macular edema: Secondary | ICD-10-CM

## 2020-09-16 NOTE — Telephone Encounter (Signed)
[place] called to advise that the sensors on file and the RX sent to for the readers are different. Currently pharmacy has Free Style 14 day Libre sensors and RX for [employer] 2 readers.  [facility] needs to know which Free Style is needed (Free [employer] 14 day or Free [employer] 2).     [phone] - Pharmacy

## 2020-09-17 ENCOUNTER — Other Ambulatory Visit: Payer: Self-pay | Admitting: *Deleted

## 2020-09-17 DIAGNOSIS — IMO0002 Reserved for concepts with insufficient information to code with codable children: Secondary | ICD-10-CM

## 2020-09-17 DIAGNOSIS — E11319 Type 2 diabetes mellitus with unspecified diabetic retinopathy without macular edema: Secondary | ICD-10-CM

## 2020-09-17 MED ORDER — FREESTYLE LIBRE 14 DAY SENSOR MISC: 1.0000 | 3 refills | Status: DC

## 2020-09-17 NOTE — Telephone Encounter (Signed)
Called pt for clarification needed-- Free Style 14 day Libre sensors . Sent Rx to [place]

## 2020-10-22 ENCOUNTER — Encounter: Payer: Self-pay | Admitting: *Deleted

## 2020-10-22 DIAGNOSIS — Z006 Encounter for examination for normal comparison and control in clinical research program: Secondary | ICD-10-CM

## 2020-10-22 NOTE — Research (Signed)
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 She is smoking occasionally and is trying to eat a heart healthy diet as much as possible. She walks a lot during the day while working but does not have an exercise regimen. She takes all of her prescribed medications as directed; Dr. Einar Gip discontinued her Kary Kos on 07-22-2020, she still takes her ASA 81mg . I thanked her for participating in our AEGIS study.                                      "CONSENT"   YES     NO   Continuing further Investigational Product and study visits for follow-up? [x]  []   Continuing consent from future biomedical research [x]  []                                      "EVENTS"    YES     NO  AE   (IF YES SEE SOURCE) []  [x]   SAE  (IF YES SEE SOURCE) []  [x]   ENDPOINT   (IF YES SEE SOURCE) []  [x]   REVASCULARIZATION  (IF YES SEE SOURCE) []  [x]   AMPUTATION   (IF YES SEE SOURCE) []  [x]   TROPONIN'S  (IF YES SEE SOURCE) []  [x]    Lifestyle Adherence Assessment:    YES NO  Abstinence from smoking/remaining tobacco free  X  Cardiac Diet X   Routine physical activity and/or cardiac rehabilitation X     Current Outpatient Medications:  .  aspirin 81 MG chewable tablet, Chew 1 tablet (81 mg total) by mouth daily., Disp:  , Rfl:  .  atorvastatin (LIPITOR) 40 MG tablet, Take 1 tablet (40 mg total) by mouth daily., Disp: 90 tablet, Rfl: 3 .  cetirizine (ZYRTEC ALLERGY) 10 MG tablet, Take 1 tablet (10 mg total) by mouth daily., Disp: 30 tablet, Rfl: 0 .  Continuous Blood Gluc Sensor (FREESTYLE LIBRE 14 DAY SENSOR) MISC, 1 each by Does not apply route every 14 (fourteen) days. Change every 2 weeks, Disp: 6 each, Rfl: 3 .  ezetimibe (ZETIA) 10 MG tablet, Take 1 tablet (10 mg total) by mouth daily after supper., Disp: 90 tablet, Rfl: 3 .  gabapentin (NEURONTIN) 300 MG capsule, Take 1 capsule (300 mg total) by mouth 3 (three) times daily. (Patient not taking:  Reported on 09/13/2020), Disp: 90 capsule, Rfl: 3 .  glucose blood (BAYER CONTOUR TEST) test strip, TEST BLOOD SUGARS 3 TIMES DAILY, Disp: 200 each, Rfl: 11 .  insulin NPH Human (NOVOLIN N RELION) 100 UNIT/ML injection, Inject 0.2-0.55 mLs (20-55 Units total) into the skin 2 (two) times daily before a meal., Disp: 99 mL, Rfl: 6 .  insulin regular (NOVOLIN R RELION) 100 units/mL injection, Inject 0.1-0.25 mLs (10-25 Units total) into the skin 3 (three) times daily before meals., Disp: 67.5 mL, Rfl: 1 .  irbesartan (AVAPRO) 75 MG tablet, Take 1 tablet by mouth once daily (Patient not taking: Reported on 09/13/2020), Disp: 30 tablet, Rfl: 3 .  Lancet Devices (EASY TOUCH LANCING DEVICE) MISC, Use to check diabetes 4 times daily  E 11.65, Disp: 1 each, Rfl: 11 .  latanoprost (XALATAN) 0.005 % ophthalmic solution, Place 1 drop into both eyes at bedtime.  (Patient not taking: Reported on  09/13/2020), Disp: , Rfl:  .  metFORMIN (GLUCOPHAGE) 1000 MG tablet, Take 1 tablet (1,000 mg total) by mouth 2 (two) times daily with a meal., Disp: 180 tablet, Rfl: 3 .  metoprolol succinate (TOPROL-XL) 50 MG 24 hr tablet, TAKE 1 TABLET BY MOUTH ONCE DAILY. TAKE WITH OR IMMEDIATELY FOLLOWING A MEAL, Disp: 30 tablet, Rfl: 3 .  Multiple Vitamin (MULTIVITAMIN) tablet, Take 1 tablet by mouth daily., Disp: , Rfl:  .  nystatin (NYSTATIN) powder, Apply powder to affected area twice daily until rash has resolved, Disp: 15 g, Rfl: 3 .  omeprazole (PRILOSEC) 40 MG capsule, Take 1 capsule by mouth once daily, Disp: 90 capsule, Rfl: 0 .  ondansetron (ZOFRAN ODT) 4 MG disintegrating tablet, Take 1 tablet (4 mg total) by mouth every 8 (eight) hours as needed for nausea or vomiting., Disp: 20 tablet, Rfl: 0 .  oxyCODONE-acetaminophen (PERCOCET) 7.5-325 MG tablet, Take 1 tablet by mouth 4 (four) times daily as needed., Disp: , Rfl:  .  sulfamethoxazole-trimethoprim (BACTRIM DS) 800-160 MG tablet, Take 1 tablet by mouth 2 (two) times daily.,  Disp: , Rfl:  .  tiZANidine (ZANAFLEX) 4 MG tablet, Take 4 mg by mouth 3 (three) times daily., Disp: , Rfl:  .  vitamin B-12 (CYANOCOBALAMIN) 1000 MCG tablet, Take 1,000 mcg by mouth daily., Disp: , Rfl:

## 2021-01-23 ENCOUNTER — Other Ambulatory Visit: Payer: Self-pay

## 2021-01-23 ENCOUNTER — Encounter: Payer: Self-pay | Admitting: Cardiology

## 2021-01-23 ENCOUNTER — Ambulatory Visit: Payer: BC Managed Care – PPO | Admitting: Cardiology

## 2021-01-23 VITALS — BP 148/92 | HR 87 | Temp 97.3°F | Resp 17 | Ht 64.0 in | Wt 199.6 lb

## 2021-01-23 DIAGNOSIS — Z716 Tobacco abuse counseling: Secondary | ICD-10-CM

## 2021-01-23 DIAGNOSIS — I10 Essential (primary) hypertension: Secondary | ICD-10-CM

## 2021-01-23 DIAGNOSIS — I25118 Atherosclerotic heart disease of native coronary artery with other forms of angina pectoris: Secondary | ICD-10-CM

## 2021-01-23 DIAGNOSIS — E782 Mixed hyperlipidemia: Secondary | ICD-10-CM

## 2021-01-23 DIAGNOSIS — F17200 Nicotine dependence, unspecified, uncomplicated: Secondary | ICD-10-CM

## 2021-01-23 MED ORDER — BUPROPION HCL ER (SR) 150 MG PO TB12: 150.0000 mg | ORAL_TABLET | Freq: Two times a day (BID) | ORAL | 2 refills | Status: DC

## 2021-01-23 MED ORDER — AMLODIPINE BESYLATE-VALSARTAN 5-160 MG PO TABS: 1.0000 | ORAL_TABLET | Freq: Every evening | ORAL | 3 refills | Status: DC

## 2021-01-23 NOTE — Progress Notes (Signed)
Primary Physician/Referring:  [facility]  Patient ID: ANYLA ISRAELSON, female    DOB: [birth year], 60 y.o.   MRN: [id number]  Chief Complaint  Patient presents with  . Coronary Artery Disease  . Hyperlipidemia    6 month   HPI:    [name]  is a 60 y.o. AAF  with diabetes mellitus, hyperlipidemia, prior cigarette smoking quit in 2013, smokes marijuana occasionally, CAD  Presenting with NSTEMI, S/P stenting to mid right and also to the distal LAD by implantation of DES on 10/09/2019.  She has not had any further chest pain.   She presents here for 73-month office visit, she has not had any recurrence of angina pectoris.  Unfortunately has continued to smoke.  No new symptomatology, from vascular standpoint she complains of severe tingling and numbness in her feet.   Past Medical History:  Diagnosis Date  . Arthritis   . Campylobacter enteritis May 2012   [facility] admission  . Carpal tunnel syndrome, bilateral   . Diabetes mellitus   . Gastritis with bleeding due to alcohol 2010   [facility] admission  . GERD (gastroesophageal reflux disease)   . Hyperlipidemia   . Hypertension    no meds  . Menopause   . Myocardial infarction ([place])   . Neck pain   . Wears glasses    Past Surgical History:  Procedure Laterality Date  . CARPAL TUNNEL RELEASE Left 05/14/2014   Procedure: LEFT ULNAR NEUROPLASTY AT ELBOW AND ENDOSCOPIC CARPAL TUNNEL RELEASE;  Surgeon: Jolyn Nap, MD;  Location: [place];  Service: Orthopedics;  Laterality: Left;  . CORONARY STENT INTERVENTION N/A 10/09/2019   Procedure: CORONARY STENT INTERVENTION;  Surgeon: Adrian Prows, MD;  Location: [place] [facility];  Service: Cardiovascular;  Laterality: N/A;  . CYST EXCISION  1995   under arms  . endometrial biopsy  2004   benign  . FOOT OSTEOTOMY  2010   right-spurs  . LEFT HEART CATH AND CORONARY ANGIOGRAPHY N/A 10/09/2019   Procedure: LEFT HEART CATH AND CORONARY ANGIOGRAPHY;  Surgeon:  Adrian Prows, MD;  Location: [place] [facility];  Service: Cardiovascular;  Laterality: N/A;  . TONSILLECTOMY    . ULNAR NERVE TRANSPOSITION Left 05/14/2014   Procedure: ULNAR NERVE DECOMPRESSION/TRANSPOSITION;  Surgeon: Jolyn Nap, MD;  Location: [place];  Service: Orthopedics;  Laterality: Left;   Social History   Tobacco Use  . Smoking status: Never Smoker  . Smokeless tobacco: Never Used  Substance Use Topics  . Alcohol use: Not Currently   Marital Status: Single   ROS  Review of Systems  Cardiovascular: Negative for dyspnea on exertion and leg swelling.  Musculoskeletal: Positive for arthritis, joint pain and neck pain (right side of the neck and radiation to arm).  Gastrointestinal: Negative for melena.  Neurological: Positive for paresthesias.   Objective   Vitals with BMI 01/23/2021 09/13/2020 09/05/2020  Height 5\' 4"  5\' 4"  5\' 4"   Weight 199 lbs 10 oz 198 lbs 10 oz 198 lbs  BMI 34.24 79.02 40.97  Systolic 353 299 242  Diastolic 92 84 90  Pulse 87 101 77    Physical Exam Constitutional:      Comments: She is moderately built and mildly obese in no acute distress.  Cardiovascular:     Rate and Rhythm: Normal rate and regular rhythm.     Pulses: Intact distal pulses.          Carotid pulses are 2+  on the right side and 2+ on the left side.      Femoral pulses are 2+ on the right side and 2+ on the left side.      Dorsalis pedis pulses are 2+ on the right side and 2+ on the left side.       Posterior tibial pulses are 2+ on the right side and 2+ on the left side.     Heart sounds: Normal heart sounds. No murmur heard. No gallop.      Comments: No leg edema, no JVD. Pulmonary:     Effort: Pulmonary effort is normal.     Breath sounds: Normal breath sounds.  Abdominal:     General: Bowel sounds are normal.     Palpations: Abdomen is soft.    Laboratory examination:   No results for input(s): NA, K, CL, CO2, GLUCOSE, BUN, CREATININE, CALCIUM,  GFRNONAA, GFRAA in the last 8760 hours. CrCl cannot be calculated (Patient's most recent lab result is older than the maximum 21 days allowed.).  CMP Latest Ref Rng & Units 12/08/2019 10/14/2019 10/10/2019  Glucose 65 - 99 mg/dL 198(H) 180(H) 296(H)  BUN 6 - 24 mg/dL 15 12 6   Creatinine 0.57 - 1.00 mg/dL 0.74 0.57 0.66  Sodium 134 - 144 mmol/L 141 136 138  Potassium 3.5 - 5.2 mmol/L 4.4 4.0 3.8  Chloride 96 - 106 mmol/L 104 102 103  CO2 20 - 29 mmol/L 24 24 26   Calcium 8.7 - 10.2 mg/dL 9.8 8.7(L) 8.8(L)  Total Protein 6.0 - 8.5 g/dL 7.2 - 6.2(L)  Total Bilirubin 0.0 - 1.2 mg/dL 0.3 - 0.7  Alkaline Phos 39 - 117 IU/L 125(H) - 89  AST 0 - 40 IU/L 15 - 14(L)  ALT 0 - 32 IU/L 22 - 17   CBC Latest Ref Rng & Units 12/08/2019 10/14/2019 10/10/2019  WBC 3.4 - 10.8 x10E3/uL 9.8 9.7 8.3  Hemoglobin 11.1 - 15.9 g/dL 12.6 11.2(L) 12.1  Hematocrit 34.0 - 46.6 % 38.3 34.8(L) 37.0  Platelets 150 - 450 x10E3/uL 379 306 273   Lipid Panel Recent Labs    04/26/20 0823  CHOL 110  TRIG 62  LDLCALC 45  HDL 51     HEMOGLOBIN A1C Lab Results  Component Value Date   HGBA1C 7.1 (A) 09/13/2020   TSH No results for input(s): TSH in the last 8760 hours. Medications and allergies   Allergies  Allergen Reactions  . Oxycodone-Acetaminophen Other (See Comments)    Causes her to feel "hot".  . Penicillins Rash    Did it involve swelling of the face/tongue/throat, SOB, or low BP? No Did it involve sudden or severe rash/hives, skin peeling, or any reaction on the inside of your mouth or nose? No Did you need to seek medical attention at a hospital or doctor's office? No When did it last happen? If all above answers are "NO", may proceed with cephalosporin use.    Outpatient Medications Prior to Visit  Medication Sig Dispense Refill  . aspirin 81 MG chewable tablet Chew 1 tablet (81 mg total) by mouth daily.    Marland Kitchen atorvastatin (LIPITOR) 40 MG tablet Take 1 tablet (40 mg total) by mouth daily. 90  tablet 3  . cetirizine (ZYRTEC ALLERGY) 10 MG tablet Take 1 tablet (10 mg total) by mouth daily. 30 tablet 0  . clindamycin (CLEOCIN) 300 MG capsule Take 300 mg by mouth every 6 (six) hours.    . Continuous Blood Gluc Sensor (FREESTYLE LIBRE 14 DAY  SENSOR) MISC 1 each by Does not apply route every 14 (fourteen) days. Change every 2 weeks 6 each 3  . ezetimibe (ZETIA) 10 MG tablet Take 1 tablet (10 mg total) by mouth daily after supper. 90 tablet 3  . gabapentin (NEURONTIN) 300 MG capsule Take 1 capsule (300 mg total) by mouth 3 (three) times daily. 90 capsule 3  . glucose blood (BAYER CONTOUR TEST) test strip TEST BLOOD SUGARS 3 TIMES DAILY 200 each 11  . insulin NPH Human (NOVOLIN N RELION) 100 UNIT/ML injection Inject 0.2-0.55 mLs (20-55 Units total) into the skin 2 (two) times daily before a meal. 99 mL 6  . insulin regular (NOVOLIN R RELION) 100 units/mL injection Inject 0.1-0.25 mLs (10-25 Units total) into the skin 3 (three) times daily before meals. 67.5 mL 1  . Lancet Devices (EASY TOUCH LANCING DEVICE) MISC Use to check diabetes 4 times daily  E 11.65 1 each 11  . latanoprost (XALATAN) 0.005 % ophthalmic solution Place 1 drop into both eyes at bedtime.    . metFORMIN (GLUCOPHAGE) 1000 MG tablet Take 1 tablet (1,000 mg total) by mouth 2 (two) times daily with a meal. 180 tablet 3  . metoprolol succinate (TOPROL-XL) 50 MG 24 hr tablet TAKE 1 TABLET BY MOUTH ONCE DAILY. TAKE WITH OR IMMEDIATELY FOLLOWING A MEAL 30 tablet 3  . Multiple Vitamin (MULTIVITAMIN) tablet Take 1 tablet by mouth daily.    . naloxone (NARCAN) nasal spray 4 mg/0.1 mL Place 1 spray into the nose as needed.    . nystatin (NYSTATIN) powder Apply powder to affected area twice daily until rash has resolved 15 g 3  . omeprazole (PRILOSEC) 40 MG capsule Take 1 capsule by mouth once daily 90 capsule 0  . ondansetron (ZOFRAN ODT) 4 MG disintegrating tablet Take 1 tablet (4 mg total) by mouth every 8 (eight) hours as needed for  nausea or vomiting. 20 tablet 0  . oxyCODONE-acetaminophen (PERCOCET) 7.5-325 MG tablet Take 1 tablet by mouth 4 (four) times daily as needed.    . sulfamethoxazole-trimethoprim (BACTRIM DS) 800-160 MG tablet Take 1 tablet by mouth 2 (two) times daily.    Marland Kitchen tiZANidine (ZANAFLEX) 4 MG tablet Take 4 mg by mouth 3 (three) times daily.    . vitamin B-12 (CYANOCOBALAMIN) 1000 MCG tablet Take 1,000 mcg by mouth daily.    . irbesartan (AVAPRO) 75 MG tablet Take 1 tablet by mouth once daily 30 tablet 3   No facility-administered medications prior to visit.   No orders of the defined types were placed in this encounter.  There are no discontinued medications.   Radiology:   CXR 10/08/2019:  The heart size and mediastinal contours are within normal limits. Both lungs are clear. The visualized skeletal structures are Unremarkable.  Cardiac Studies:   Echocardiogram 10/09/2019:   1. Left ventricular ejection fraction, by visual estimation, is 50 to 55%. The left ventricle has low normal function. There is mildly increased left ventricular hypertrophy. Basal to mid inferolateral hypokinesis  2. The average left ventricular global longitudinal strain is -14.9 %.  3. Global right ventricle has normal systolic function.The right ventricular size is normal. No increase in right ventricular wall thickness.  Left heart catheterization 10/09/2019: Hyperdynamic LV, EF 65 to 70%, EDP normal. RCA is a dominant vessel. Ulcerated tandem 80% followed by a 99% stenosis. SP 2.5 x 38 and a 2.5 x 8 mm resolute Onyx DES stents implanted, second stent used for distal edge dissection. Stenosis reduced to  0% with TIMI-3 to TIMI-3 flow. Circumflex coronary artery is smooth with minimal disease. Gives origin to large OM1 and OM 2. Tortuous. LAD is diffusely diseased from the mid to distal segment. There is a hazy 70 to 80% stenosis in the mid LAD. Following which distal LAD had a 40% diffuse disease. Mild  calcification is evident. SP 2.24 x 15 mm resolute Onyx DES postdilated with 2.25 x 8 mm [place] at 22 atmospheric pressure with 0% residual stenosis. TIMI-3 to TIMI-3 flow.  Carotid artery duplex  08/13/2020: Minimal stenosis in the left internal carotid artery (1-15%). Stenosis in the left common carotid artery (<50%). Antegrade right vertebral artery flow. Antegrade left vertebral artery flow. Follow up studies if clinically indicated.  EKG:   EKG 01/23/2021: Sinus rhythm with short PR interval at rate of 87 bpm, normal axis, otherwise normal EKG.    Assessment     ICD-10-CM   1. Coronary artery disease involving native coronary artery of native heart with other form of angina pectoris ([place])  I25.118 EKG 12-Lead    Meds ordered this encounter  Medications  . amLODipine-valsartan (EXFORGE) 5-160 MG tablet    Sig: Take 1 tablet by mouth every evening.    Dispense:  30 tablet    Refill:  3  . buPROPion (WELLBUTRIN SR) 150 MG 12 hr tablet    Sig: Take 1 tablet (150 mg total) by mouth 2 (two) times daily at 10 am and 4 pm. Start tablet daily for 3 days    Dispense:  60 tablet    Refill:  2   Medications Discontinued During This Encounter  Medication Reason  . irbesartan (AVAPRO) 75 MG tablet Prescription never filled   Orders Placed This Encounter  Procedures  . EKG 12-Lead     Recommendations:   No orders of the defined types were placed in this encounter.   Patient completed The AEGIS-II Study.   [name]  is a 60 y.o. AAF  with diabetes mellitus, hyperlipidemia, prior cigarette smoking quit in 2013, smokes marijuana occasionally, CAD  Presenting with NSTEMI, S/P stenting to mid right and also to the distal LAD by implantation of DES on 10/09/2019.  She has not had any further chest pain but continues to have  pain in her neck and also her arms constantly which is clearly radicular pain.   She is presently doing well and has not had recurrence of angina  pectoris.  She admits to poor diet, blood pressure is uncontrolled, will discontinue losartan and add Exforge 10/160 mg daily.  She has an appointment to see her PCP on the 10th of next month which is in 2 weeks that will give enough time to recheck her CMP or BMP.  She also needs lipid profile testing.  We discussed regarding tobacco use disorder.  Due to weight gain and also tobacco use, will try Wellbutrin.  I would like to see him back in 2.5 months to follow-up on her tobacco use and abstinence.  I would like to also follow-up with her hypertension.  From cardiac standpoint she is doing well without clinical evidence of heart failure.  She does have right common carotid artery stenosis and will need surveillance Doppler probably in 6 months to a year.  Her tingling and numbness in her feet is probably related to diabetic neuropathy.   Adrian Prows, MD, [facility] 01/23/2021, 2:57 PM Office: [phone]

## 2021-01-24 ENCOUNTER — Other Ambulatory Visit: Payer: Self-pay | Admitting: Internal Medicine

## 2021-01-24 ENCOUNTER — Ambulatory Visit (INDEPENDENT_AMBULATORY_CARE_PROVIDER_SITE_OTHER): Payer: BC Managed Care – PPO | Admitting: Internal Medicine

## 2021-01-24 ENCOUNTER — Encounter: Payer: Self-pay | Admitting: Internal Medicine

## 2021-01-24 VITALS — BP 132/86 | HR 86 | Ht 64.0 in | Wt 197.0 lb

## 2021-01-24 DIAGNOSIS — IMO0002 Reserved for concepts with insufficient information to code with codable children: Secondary | ICD-10-CM

## 2021-01-24 DIAGNOSIS — E1169 Type 2 diabetes mellitus with other specified complication: Secondary | ICD-10-CM | POA: Diagnosis not present

## 2021-01-24 DIAGNOSIS — E669 Obesity, unspecified: Secondary | ICD-10-CM

## 2021-01-24 DIAGNOSIS — E785 Hyperlipidemia, unspecified: Secondary | ICD-10-CM

## 2021-01-24 DIAGNOSIS — Z794 Long term (current) use of insulin: Secondary | ICD-10-CM

## 2021-01-24 DIAGNOSIS — Z6832 Body mass index (BMI) 32.0-32.9, adult: Secondary | ICD-10-CM

## 2021-01-24 DIAGNOSIS — E11319 Type 2 diabetes mellitus with unspecified diabetic retinopathy without macular edema: Secondary | ICD-10-CM

## 2021-01-24 DIAGNOSIS — E1165 Type 2 diabetes mellitus with hyperglycemia: Secondary | ICD-10-CM | POA: Diagnosis not present

## 2021-01-24 LAB — POCT GLYCOSYLATED HEMOGLOBIN (HGB A1C): Hemoglobin A1C: 7.3 % — AB (ref 4.0–5.6)

## 2021-01-24 MED ORDER — FREESTYLE LIBRE 2 READER DEVI: 1.0000 | Freq: Every day | 0 refills | Status: DC

## 2021-01-24 MED ORDER — OZEMPIC (0.25 OR 0.5 MG/DOSE) 2 MG/1.5ML ~~LOC~~ SOPN: 0.5000 mg | PEN_INJECTOR | SUBCUTANEOUS | 5 refills | Status: DC

## 2021-01-24 MED ORDER — FREESTYLE LIBRE 2 SENSOR MISC: 1.0000 | 3 refills | Status: DC

## 2021-01-24 NOTE — Patient Instructions (Addendum)
Please continue: Insulin Before breakfast Before lunch Before dinner  R 10 10 25   NPH 15 - 20   Continue Metformin 1000 mg 2x a day.  Please start the CGM.   Send me the sugars in 2 weeks.  Please start Ozempic 0.25 mg weekly in a.m. (for example on Sunday morning) x 4 weeks, then increase to 0.5 mg weekly in a.m. if no nausea or hypoglycemia.  If you are able to start Ozempic, we will likely need to decrease the R(clear) insulin doses. Let me know!  Please return in 1.5 months with your sugar log.

## 2021-01-24 NOTE — Addendum Note (Signed)
Addended by: Elta Guadeloupe on: 01/24/2021 04:51 PM   Modules accepted: Orders

## 2021-01-24 NOTE — Addendum Note (Signed)
Addended by: Kaylyn Lim I on: 01/24/2021 04:05 PM   Modules accepted: Orders

## 2021-01-24 NOTE — Progress Notes (Addendum)
Patient ID: [name], female   DOB: January 06, 1961, 60 y.o.   MRN: [id number]  This visit occurred during the SARS-CoV-2 public health emergency.  Safety protocols were in place, including screening questions prior to the visit, additional usage of staff PPE, and extensive cleaning of exam room while observing appropriate contact time as indicated for disinfecting solutions.   HPI: [name] is a 60 y.o.-year-old female, returning for f/u for DM2, dx ~2000, insulin-dependent since 2010, uncontrolled, with complications (PN, + DR). Last visit 4 months ago. She is not seeing Dr. Derrel Nip anymore as she does not want to drive to [facility].  She has a history of an NSTEMI in 10/2019 and had 3 stents placed.  She started cardiac rehab afterwards.  Her sister also had an MI at that time and she did not survive.  Reviewed HbA1c levels: Lab Results  Component Value Date   HGBA1C 7.1 (A) 09/13/2020   HGBA1C 6.4 (A) 05/13/2020   HGBA1C 7.6 (A) 08/31/2019   HGBA1C 7.8 (A) 11/25/2018   HGBA1C 6.8 (A) 07/25/2018   HGBA1C 6.4% 03/03/2018   HGBA1C 6.5 11/01/2017   HGBA1C 6.2 07/23/2017   HGBA1C 6.8 04/09/2017   HGBA1C 6.4 01/08/2017   HGBA1C 6.4 10/08/2016   HGBA1C 10.4 07/07/2016   HGBA1C 10.4 04/07/2016   HGBA1C 9.3 01/14/2016   HGBA1C 10.9 (H) 09/25/2015   HGBA1C 11.2 09/19/2015   HGBA1C 7.7 06/21/2015   HGBA1C 7.9 (H) 03/22/2015   HGBA1C 9.4 (H) 01/04/2015   HGBA1C 8.8 (H) 10/04/2014   HGBA1C 10.3 (H) 07/06/2014   HGBA1C 7.0 (H) 03/26/2014   HGBA1C 9.5 (H) 12/26/2013   HGBA1C 7.5 (H) 09/20/2013   HGBA1C 8.5 (H) 06/19/2013   HGBA1C 10.3 (H) 01/31/2013   HGBA1C 10.5 (H) 05/27/2012   HGBA1C 8.4 (H) 11/20/2011   HGBA1C 9.5 (H) 08/11/2011   HGBA1C 6.7 03/07/2007   Pt is on:  Insulin Before breakfast Before lunch Before dinner  R 10-15 >> 10 10-15 >> 10 25  NPH 15 - 25 >> 20  - Metformin 1000 mg twice a day.  Pt is checking her sugars 1-2 times a day - but not in the last few  days due to problems with her meter: - am:  69, 89-144, 216 >> 114-160, 245 (forgot insulin) >> 92-154 - 2h after b'fast: 154 >> n/c > 203 >> n/c >> 223 >> n/c - lunch: 81, 87 >> 100-130 >> 110-120 >> ? - mid-afternoon:  248 >> 116, 189 >> n/c - before dinner: 135 >> 108-114 >> n/c  - 2h after dinner:  216, 219, 451 >> n/c  - bedtime:  104-159 >> 150-180 >> 174-200 >> ? - nighttime:  n/c >> 188, 200, 497 >> n/c Lowest sugar was 38 (inj insulin but ate only little) >> ... 49 >> 69 >> 80s >> 29 (?) in 11/2020; she has hypoglycemia awareness in the 80s. Highest sugar was  497 >> 226 >> 245 >> 200s  -No CKD, last BUN/creatinine:  Lab Results  Component Value Date   BUN 15 12/08/2019   CREATININE 0.74 12/08/2019   ACR was normal: Lab Results  Component Value Date   MICRALBCREAT 1.8 06/27/2018   MICRALBCREAT 1.6 06/23/2017   MICRALBCREAT 0.7 06/19/2016   MICRALBCREAT 0.4 09/25/2015   MICRALBCREAT 0.3 07/30/2014   MICRALBCREAT 0.5 06/19/2013   MICRALBCREAT 1.5 01/31/2013   MICRALBCREAT 2.6 11/20/2011  Not on ACE inhibitor/ARB  -+ HL; last set of lipids: Lab Results  Component  Value Date   CHOL 110 04/26/2020   HDL 51 04/26/2020   LDLCALC 45 04/26/2020   LDLDIRECT 94.0 09/25/2015   TRIG 62 04/26/2020   CHOLHDL 4.4 10/09/2019  On Lipitor 40, Zetia 10.  - last eye exam was in 12/2018: + DR OU, reportedly glaucoma. She sees Dr. Arnoldo Morale.  -She has numbness and tingling in her feet.  She is on Neurontin.  She sees podiatry.  She also has a history of HTN, episodic alcohol abuse, GERD, low back pain, OA.   No h/o pancreatitis or FH MTC.  ROS: Constitutional: no weight gain/no weight loss, no fatigue, no subjective hyperthermia, no subjective hypothermia Eyes: no blurry vision, no xerophthalmia ENT: no sore throat, no nodules palpated in neck, no dysphagia, no odynophagia, no hoarseness Cardiovascular: no CP/no SOB/no palpitations/no leg swelling Respiratory: no cough/no  SOB/no wheezing Gastrointestinal: no N/no V/no D/no C/no acid reflux Musculoskeletal: + muscle aches/+ joint aches Skin: no rashes, no hair loss Neurological: no tremors/+ numbness/+ tingling/no dizziness  I reviewed pt's medications, allergies, PMH, social hx, family hx, and changes were documented in the history of present illness. Otherwise, unchanged from my initial visit note.  Past Medical History:  Diagnosis Date  . Arthritis   . Campylobacter enteritis May 2012   [facility] admission  . Carpal tunnel syndrome, bilateral   . Diabetes mellitus   . Gastritis with bleeding due to alcohol 2010   [facility] admission  . GERD (gastroesophageal reflux disease)   . Hyperlipidemia   . Hypertension    no meds  . Menopause   . Myocardial infarction ([place])   . Neck pain   . Wears glasses    Past Surgical History:  Procedure Laterality Date  . CARPAL TUNNEL RELEASE Left 05/14/2014   Procedure: LEFT ULNAR NEUROPLASTY AT ELBOW AND ENDOSCOPIC CARPAL TUNNEL RELEASE;  Surgeon: Jolyn Nap, MD;  Location: [facility];  Service: Orthopedics;  Laterality: Left;  . CORONARY STENT INTERVENTION N/A 10/09/2019   Procedure: CORONARY STENT INTERVENTION;  Surgeon: Adrian Prows, MD;  Location: [facility];  Service: Cardiovascular;  Laterality: N/A;  . CYST EXCISION  1995   under arms  . endometrial biopsy  2004   benign  . FOOT OSTEOTOMY  2010   right-spurs  . LEFT HEART CATH AND CORONARY ANGIOGRAPHY N/A 10/09/2019   Procedure: LEFT HEART CATH AND CORONARY ANGIOGRAPHY;  Surgeon: Adrian Prows, MD;  Location: [facility];  Service: Cardiovascular;  Laterality: N/A;  . TONSILLECTOMY    . ULNAR NERVE TRANSPOSITION Left 05/14/2014   Procedure: ULNAR NERVE DECOMPRESSION/TRANSPOSITION;  Surgeon: Jolyn Nap, MD;  Location: [facility];  Service: Orthopedics;  Laterality: Left;   Social History   Socioeconomic History  . Marital status: Single    Spouse name: Not on  file  . Number of children: 2  . Years of education: 61  . Highest education level: High school graduate  Occupational History  . Not on file  Tobacco Use  . Smoking status: Never Smoker  . Smokeless tobacco: Never Used  Vaping Use  . Vaping Use: Never used  Substance and Sexual Activity  . Alcohol use: Not Currently  . Drug use: Yes    Types: Marijuana    Comment: sometimes  . Sexual activity: Yes    Birth control/protection: None    Comment: same partner x 10 years  Other Topics Concern  . Not on file  Social History Narrative   She works  as a custodian.   Right-handed.   One cup caffeine per day.   She lives at home with daughter.   Social Determinants of Health   Financial Resource Strain: Not on file  Food Insecurity: Not on file  Transportation Needs: Not on file  Physical Activity: Not on file  Stress: Not on file  Social Connections: Not on file  Intimate Partner Violence: Not on file   Current Outpatient Medications on File Prior to Visit  Medication Sig Dispense Refill  . amLODipine-valsartan (EXFORGE) 5-160 MG tablet Take 1 tablet by mouth every evening. 30 tablet 3  . aspirin 81 MG chewable tablet Chew 1 tablet (81 mg total) by mouth daily.    Marland Kitchen atorvastatin (LIPITOR) 40 MG tablet Take 1 tablet (40 mg total) by mouth daily. 90 tablet 3  . buPROPion (WELLBUTRIN SR) 150 MG 12 hr tablet Take 1 tablet (150 mg total) by mouth 2 (two) times daily at 10 am and 4 pm. Start tablet daily for 3 days 60 tablet 2  . cetirizine (ZYRTEC ALLERGY) 10 MG tablet Take 1 tablet (10 mg total) by mouth daily. 30 tablet 0  . clindamycin (CLEOCIN) 300 MG capsule Take 300 mg by mouth every 6 (six) hours.    . Continuous Blood Gluc Sensor (FREESTYLE LIBRE 14 DAY SENSOR) MISC 1 each by Does not apply route every 14 (fourteen) days. Change every 2 weeks 6 each 3  . ezetimibe (ZETIA) 10 MG tablet Take 1 tablet (10 mg total) by mouth daily after supper. 90 tablet 3  . gabapentin  (NEURONTIN) 300 MG capsule Take 1 capsule (300 mg total) by mouth 3 (three) times daily. 90 capsule 3  . glucose blood (BAYER CONTOUR TEST) test strip TEST BLOOD SUGARS 3 TIMES DAILY 200 each 11  . insulin NPH Human (NOVOLIN N RELION) 100 UNIT/ML injection Inject 0.2-0.55 mLs (20-55 Units total) into the skin 2 (two) times daily before a meal. 99 mL 6  . insulin regular (NOVOLIN R RELION) 100 units/mL injection Inject 0.1-0.25 mLs (10-25 Units total) into the skin 3 (three) times daily before meals. 67.5 mL 1  . Lancet Devices (EASY TOUCH LANCING DEVICE) MISC Use to check diabetes 4 times daily  E 11.65 1 each 11  . latanoprost (XALATAN) 0.005 % ophthalmic solution Place 1 drop into both eyes at bedtime.    . metFORMIN (GLUCOPHAGE) 1000 MG tablet Take 1 tablet (1,000 mg total) by mouth 2 (two) times daily with a meal. 180 tablet 3  . metoprolol succinate (TOPROL-XL) 50 MG 24 hr tablet TAKE 1 TABLET BY MOUTH ONCE DAILY. TAKE WITH OR IMMEDIATELY FOLLOWING A MEAL 30 tablet 3  . Multiple Vitamin (MULTIVITAMIN) tablet Take 1 tablet by mouth daily.    . naloxone (NARCAN) nasal spray 4 mg/0.1 mL Place 1 spray into the nose as needed.    . nystatin (NYSTATIN) powder Apply powder to affected area twice daily until rash has resolved 15 g 3  . omeprazole (PRILOSEC) 40 MG capsule Take 1 capsule by mouth once daily 90 capsule 0  . ondansetron (ZOFRAN ODT) 4 MG disintegrating tablet Take 1 tablet (4 mg total) by mouth every 8 (eight) hours as needed for nausea or vomiting. 20 tablet 0  . oxyCODONE-acetaminophen (PERCOCET) 7.5-325 MG tablet Take 1 tablet by mouth 4 (four) times daily as needed.    . sulfamethoxazole-trimethoprim (BACTRIM DS) 800-160 MG tablet Take 1 tablet by mouth 2 (two) times daily.    Marland Kitchen tiZANidine (ZANAFLEX) 4  MG tablet Take 4 mg by mouth 3 (three) times daily.    . vitamin B-12 (CYANOCOBALAMIN) 1000 MCG tablet Take 1,000 mcg by mouth daily.     No current facility-administered medications on  file prior to visit.   Allergies  Allergen Reactions  . Oxycodone-Acetaminophen Other (See Comments)    Causes her to feel "hot".  . Penicillins Rash    Did it involve swelling of the face/tongue/throat, SOB, or low BP? No Did it involve sudden or severe rash/hives, skin peeling, or any reaction on the inside of your mouth or nose? No Did you need to seek medical attention at a hospital or doctor's office? No When did it last happen? If all above answers are "NO", may proceed with cephalosporin use.   Family History  Problem Relation Age of Onset  . Hypertension Mother   . Diabetes Mother   . Heart attack Mother   . Stroke Father   . Diabetes Father   . Diabetes Maternal Grandmother   . Hypertension Maternal Grandmother   . Heart attack Sister     PE: BP 132/86   Pulse 86   Ht 5\' 4"  (1.626 m)   Wt 197 lb (89.4 kg)   SpO2 99%   BMI 33.81 kg/m  Body mass index is 33.81 kg/m. Wt Readings from Last 3 Encounters:  01/24/21 197 lb (89.4 kg)  01/23/21 199 lb 9.6 oz (90.5 kg)  09/13/20 198 lb 9.6 oz (90.1 kg)   Constitutional: overweight, in NAD Eyes: PERRLA, EOMI, no exophthalmos ENT: moist mucous membranes, no thyromegaly, no cervical lymphadenopathy Cardiovascular: RRR, No MRG Respiratory: CTA B Gastrointestinal: abdomen soft, NT, ND, BS+ Musculoskeletal: no deformities, strength intact in all 4 Skin: moist, warm, no rashes Neurological: no tremor with outstretched hands, DTR normal in all 4  ASSESSMENT: 1. DM2, insulin-dependent, uncontrolled, with complications - peripheral neuropathy - DR   2. Obesity class 1 - BMI 32.79  BMI Classification:  < 18.5 underweight   18.5-24.9 normal weight   25.0-29.9 overweight   30.0-34.9 class I obesity   35.0-39.9 class II obesity   ? 40.0 class III obesity   3. HL  PLAN:  1. Patient with longstanding uncontrolled, type 2 diabetes, with improved control in the last 3 years after she started to take her  insulin consistently.  She had an NSTEMI in 10/2019 with 3 subsequent stents.  She had cardiac rehab afterwards.  Sugars improved.  HbA1c before last visit was 6.4% but this increased to 7.1% at last visit.  At that time, I advised her to increase her regular insulin before dinner as sugars were higher after dinner and occasionally also in the morning, with spikes in the 200s.  We did not change the rest of her regimen at that time. -At this visit, she tells me that she had a severe hypoglycemic episode at an appointment with cardiology -but I did not clear to me when this happenned as she cannot clarify.  I reviewed the chart and do not see an evidence of this in the system.  She tells me that her sugars dropped as low as 29.  She also cannot clarify what could have brought this about.  However, she did decrease the doses of her insulin slightly afterwards.  No lows since then.  However, upon questioning, she has not been checking sugars recently.  At today's visit, she cannot remember the sugars check later in the day, but the ones in the morning at are slightly  higher than target.  Therefore, it is difficult to change her medication regimen for now.  Advised her to start taking consistently and send that sugars to me in 2 weeks.  I also suggested a CGM, which we did try in the past but was not covered by her.  I would like to try this again.  I sent a new prescription to her pharmacy. -I also suggested a GLP-1 receptor agonist and I hope that this will be covered for her especially in the setting of her heart disease.  We discussed about benefits and possible side effects.  I advised her to start at a lower dose upon that demonstrated pen use) and increase as tolerated after 4 weeks.  I explained that if she is able to start Ozempic, I would expect the doses of her regular insulin to be able to be decreased.  I advised her to let me know if sugars start to decrease after starting Ozempic. - I advised her  to: Patient Instructions   Please continue: Insulin Before breakfast Before lunch Before dinner  R 10 10 25   NPH 15 - 20   Continue Metformin 1000 mg 2x a day.  Please start the CGM.   Send me the sugars in 2 weeks.  Please start Ozempic 0.25 mg weekly in a.m. (for example on Sunday morning) x 4 weeks, then increase to 0.5 mg weekly in a.m. if no nausea or hypoglycemia.  If you are able to start Ozempic, we will likely need to decrease the R(clear) insulin doses. Let me know!  Please return in 1.5 months with your sugar log.  - we checked her HbA1c: 7.3% (higher) - advised to check sugars at different times of the day - 3x a day, rotating check times - advised for yearly eye exams >> she is not UTD - return to clinic in 1.5 months    2. Obesity class 1 -We will continue Metformin which is weight stabilizing long-term; will also try to start a GLP-1 receptor agonist which should further help with weight loss -She gained 3 pounds before last visit -She lost 1 pound since then  3. HL -Reviewed latest lipid panel from 03/2020: All fractions at goal: Lab Results  Component Value Date   CHOL 110 04/26/2020   HDL 51 04/26/2020   LDLCALC 45 04/26/2020   LDLDIRECT 94.0 09/25/2015   TRIG 62 04/26/2020   CHOLHDL 4.4 10/09/2019  -On Lipitor 40 and Zetia 10.  At last visit, she was off as she ran out.  We restarted them.  Component     Latest Ref Rng & Units 01/24/2021  Glucose     65 - 99 mg/dL 73  BUN     7 - 25 mg/dL 18  Creatinine     0.50 - 0.99 mg/dL 0.65  GFR, Est Non African American     > OR = 60 mL/min/1.73m2 96  GFR, Est African American     > OR = 60 mL/min/1.73m2 111  BUN/Creatinine Ratio     6 - 22 (calc) NOT APPLICABLE  Sodium     135 - 146 mmol/L 142  Potassium     3.5 - 5.3 mmol/L 4.3  Chloride     98 - 110 mmol/L 105  CO2     20  - 32 mmol/L 27  Calcium     8.6 - 10.4 mg/dL 10.1  Total Protein     6.1 - 8.1 g/dL 7.3  Albumin MSPROF  3.6 -  5.1 g/dL 4.5  Globulin     1.9 - 3.7 g/dL (calc) 2.8  AG Ratio     1.0 - 2.5 (calc) 1.6  Total Bilirubin     0.2 - 1.2 mg/dL 0.4  Alkaline phosphatase (APISO)     37 - 153 U/L 91  AST     10 - 35 U/L 18  ALT     6 - 29 U/L 19  Cholesterol, Total     100 - 199 mg/dL 124  Triglycerides     0 - 149 mg/dL 99  HDL Cholesterol     >39 mg/dL 51  VLDL Cholesterol Cal     5 - 40 mg/dL 18  LDL Chol Calc (NIH)     0 - 99 mg/dL 55  Total CHOL/HDL Ratio     0.0 - 4.4 ratio 2.4  Creatinine, Urine     Not Estab. mg/dL 262.6  Microalbumin, Urine     Not Estab. ug/mL 7.7  MICROALB/CREAT RATIO     0 - 29 mg/g creat 3  Labs are all at goal.  Philemon Kingdom, MD PhD [facility] Endocrinology

## 2021-01-25 LAB — COMPLETE METABOLIC PANEL WITH GFR
AG Ratio: 1.6 (calc) (ref 1.0–2.5)
ALT: 19 U/L (ref 6–29)
AST: 18 U/L (ref 10–35)
Albumin: 4.5 g/dL (ref 3.6–5.1)
Alkaline phosphatase (APISO): 91 U/L (ref 37–153)
BUN: 18 mg/dL (ref 7–25)
CO2: 27 mmol/L (ref 20–32)
Calcium: 10.1 mg/dL (ref 8.6–10.4)
Chloride: 105 mmol/L (ref 98–110)
Creat: 0.65 mg/dL (ref 0.50–0.99)
GFR, Est African American: 111 mL/min/{1.73_m2} (ref >=60)
GFR, Est Non African American: 96 mL/min/{1.73_m2} (ref >=60)
Globulin: 2.8 g/dL (calc) (ref 1.9–3.7)
Glucose, Bld: 73 mg/dL (ref 65–99)
Potassium: 4.3 mmol/L (ref 3.5–5.3)
Sodium: 142 mmol/L (ref 135–146)
Total Bilirubin: 0.4 mg/dL (ref 0.2–1.2)
Total Protein: 7.3 g/dL (ref 6.1–8.1)

## 2021-01-25 LAB — MICROALBUMIN / CREATININE URINE RATIO
Creatinine, Urine: 262.6 mg/dL
Microalb/Creat Ratio: 3 mg/g creat (ref 0–29)
Microalbumin, Urine: 7.7 ug/mL

## 2021-01-25 LAB — LIPID PANEL
Chol/HDL Ratio: 2.4 ratio (ref 0.0–4.4)
Cholesterol, Total: 124 mg/dL (ref 100–199)
HDL: 51 mg/dL (ref >39)
LDL Chol Calc (NIH): 55 mg/dL (ref 0–99)
Triglycerides: 99 mg/dL (ref 0–149)
VLDL Cholesterol Cal: 18 mg/dL (ref 5–40)

## 2021-02-16 NOTE — Progress Notes (Signed)
[facility] [address] [place] Phone: [phone] Subjective:   I [name] am serving as a Education administrator for Dr. Hulan Saas.  This visit occurred during the SARS-CoV-2 public health emergency.  Safety protocols were in place, including screening questions prior to the visit, additional usage of staff PPE, and extensive cleaning of exam room while observing appropriate contact time as indicated for disinfecting solutions.   I'm seeing this patient by the request  of:  Carylon Perches, NP  CC: knee and foot pain follow up   TOI:[id number]   09/05/2020 Patient has significant arthritic changes of the left knee that has been seen on the MRI.  Patient has moderate on the contralateral side.  No longer responding to the steroid injections or the viscosupplementation.  We discussed potential referral to surgical intervention to discuss the possibility of any arthroscopic procedure versus replacement.  Patient would like to hold on this.  We discussed over-the-counter orthotics icing regimen, topical anti-inflammatories.  Patient is going to increase activity and follow-up with me as needed  Update 02/17/2021 TANAYA DUNIGAN is a 60 y.o. female coming in with complaint of biltareal knee pain. Both knees are painful and her right quad is painful and TTP.  Patient has known arthritic changes of the knee ever severely worsening with patient's MRI.  Patient is having more pain.  Patient wants to see orthopedic surgery but never went to go see them. Patient states that she was seen by another provider and did have a Doppler done on the lower extremity that was unremarkable.  I do not have that report.    Past Medical History:  Diagnosis Date  . Arthritis   . Campylobacter enteritis May 2012   [facility] admission  . Carpal tunnel syndrome, bilateral   . Diabetes mellitus   . Gastritis with bleeding due to alcohol 2010   [facility] admission  . GERD  (gastroesophageal reflux disease)   . Hyperlipidemia   . Hypertension    no meds  . Menopause   . Myocardial infarction ([place])   . Neck pain   . Wears glasses    Past Surgical History:  Procedure Laterality Date  . CARPAL TUNNEL RELEASE Left 05/14/2014   Procedure: LEFT ULNAR NEUROPLASTY AT ELBOW AND ENDOSCOPIC CARPAL TUNNEL RELEASE;  Surgeon: Jolyn Nap, MD;  Location: [facility];  Service: Orthopedics;  Laterality: Left;  . CORONARY STENT INTERVENTION N/A 10/09/2019   Procedure: CORONARY STENT INTERVENTION;  Surgeon: Adrian Prows, MD;  Location: [place] [facility];  Service: Cardiovascular;  Laterality: N/A;  . CYST EXCISION  1995   under arms  . endometrial biopsy  2004   benign  . FOOT OSTEOTOMY  2010   right-spurs  . LEFT HEART CATH AND CORONARY ANGIOGRAPHY N/A 10/09/2019   Procedure: LEFT HEART CATH AND CORONARY ANGIOGRAPHY;  Surgeon: Adrian Prows, MD;  Location: [place] [facility];  Service: Cardiovascular;  Laterality: N/A;  . TONSILLECTOMY    . ULNAR NERVE TRANSPOSITION Left 05/14/2014   Procedure: ULNAR NERVE DECOMPRESSION/TRANSPOSITION;  Surgeon: Jolyn Nap, MD;  Location: [facility];  Service: Orthopedics;  Laterality: Left;   Social History   Socioeconomic History  . Marital status: Single    Spouse name: Not on file  . Number of children: 2  . Years of education: 63  . Highest education level: High school graduate  Occupational History  . Not on file  Tobacco Use  .  Smoking status: Never Smoker  . Smokeless tobacco: Never Used  Vaping Use  . Vaping Use: Never used  Substance and Sexual Activity  . Alcohol use: Not Currently  . Drug use: Yes    Types: Marijuana    Comment: sometimes  . Sexual activity: Yes    Birth control/protection: None    Comment: same partner x 10 years  Other Topics Concern  . Not on file  Social History Narrative   She works as a Sports coach.   Right-handed.   One cup caffeine per day.   She  lives at home with daughter.   Social Determinants of Health   Financial Resource Strain: Not on file  Food Insecurity: Not on file  Transportation Needs: Not on file  Physical Activity: Not on file  Stress: Not on file  Social Connections: Not on file   Allergies  Allergen Reactions  . Oxycodone-Acetaminophen Other (See Comments)    Causes her to feel "hot".  . Penicillins Rash    Did it involve swelling of the face/tongue/throat, SOB, or low BP? No Did it involve sudden or severe rash/hives, skin peeling, or any reaction on the inside of your mouth or nose? No Did you need to seek medical attention at a hospital or doctor's office? No When did it last happen? If all above answers are "NO", may proceed with cephalosporin use.   Family History  Problem Relation Age of Onset  . Hypertension Mother   . Diabetes Mother   . Heart attack Mother   . Stroke Father   . Diabetes Father   . Diabetes Maternal Grandmother   . Hypertension Maternal Grandmother   . Heart attack Sister     Current Outpatient Medications (Endocrine & Metabolic):  .  insulin NPH Human (NOVOLIN N RELION) 100 UNIT/ML injection, Inject 0.2-0.55 mLs (20-55 Units total) into the skin 2 (two) times daily before a meal. .  insulin regular (NOVOLIN R RELION) 100 units/mL injection, Inject 0.1-0.25 mLs (10-25 Units total) into the skin 3 (three) times daily before meals. .  metFORMIN (GLUCOPHAGE) 1000 MG tablet, Take 1 tablet (1,000 mg total) by mouth 2 (two) times daily with a meal. .  Semaglutide,0.25 or 0.5MG /DOS, (OZEMPIC, 0.25 OR 0.5 MG/DOSE,) 2 MG/1.5ML SOPN, Inject 0.5 mg into the skin once a week.  Current Outpatient Medications (Cardiovascular):  .  amLODipine-valsartan (EXFORGE) 5-160 MG tablet, Take 1 tablet by mouth every evening. Marland Kitchen  atorvastatin (LIPITOR) 40 MG tablet, Take 1 tablet (40 mg total) by mouth daily. Marland Kitchen  ezetimibe (ZETIA) 10 MG tablet, Take 1 tablet (10 mg total) by mouth daily after  supper. .  metoprolol succinate (TOPROL-XL) 50 MG 24 hr tablet, TAKE 1 TABLET BY MOUTH ONCE DAILY. TAKE WITH OR IMMEDIATELY FOLLOWING A MEAL  Current Outpatient Medications (Respiratory):  .  cetirizine (ZYRTEC ALLERGY) 10 MG tablet, Take 1 tablet (10 mg total) by mouth daily.  Current Outpatient Medications (Analgesics):  .  aspirin 81 MG chewable tablet, Chew 1 tablet (81 mg total) by mouth daily. Marland Kitchen  oxyCODONE-acetaminophen (PERCOCET) 7.5-325 MG tablet, Take 1 tablet by mouth 4 (four) times daily as needed.  Current Outpatient Medications (Hematological):  .  vitamin B-12 (CYANOCOBALAMIN) 1000 MCG tablet, Take 1,000 mcg by mouth daily.  Current Outpatient Medications (Other):  Marland Kitchen  buPROPion (WELLBUTRIN SR) 150 MG 12 hr tablet, Take 1 tablet (150 mg total) by mouth 2 (two) times daily at 10 am and 4 pm. Start tablet daily for 3 days .  clindamycin (CLEOCIN) 300 MG capsule, Take 300 mg by mouth every 6 (six) hours. .  Continuous Blood Gluc Receiver (FREESTYLE LIBRE 2 READER) DEVI, 1 each by Does not apply route daily. .  Continuous Blood Gluc Sensor (FREESTYLE LIBRE 2 SENSOR) MISC, 1 each by Does not apply route every 14 (fourteen) days. Marland Kitchen  gabapentin (NEURONTIN) 300 MG capsule, Take 1 capsule (300 mg total) by mouth 3 (three) times daily. Marland Kitchen  glucose blood (BAYER CONTOUR TEST) test strip, TEST BLOOD SUGARS 3 TIMES DAILY .  Lancet Devices (EASY TOUCH LANCING DEVICE) MISC, Use to check diabetes 4 times daily  E 11.65 .  latanoprost (XALATAN) 0.005 % ophthalmic solution, Place 1 drop into both eyes at bedtime. .  Multiple Vitamin (MULTIVITAMIN) tablet, Take 1 tablet by mouth daily. .  naloxone (NARCAN) nasal spray 4 mg/0.1 mL, Place 1 spray into the nose as needed. .  nystatin (NYSTATIN) powder, Apply powder to affected area twice daily until rash has resolved .  omeprazole (PRILOSEC) 40 MG capsule, Take 1 capsule by mouth once daily .  ondansetron (ZOFRAN ODT) 4 MG disintegrating tablet, Take  1 tablet (4 mg total) by mouth every 8 (eight) hours as needed for nausea or vomiting. .  sulfamethoxazole-trimethoprim (BACTRIM DS) 800-160 MG tablet, Take 1 tablet by mouth 2 (two) times daily. Marland Kitchen  tiZANidine (ZANAFLEX) 4 MG tablet, Take 4 mg by mouth 3 (three) times daily.   Reviewed prior external information including notes and imaging from  primary care provider As well as notes that were available from care everywhere and other healthcare systems.  Past medical history, social, surgical and family history all reviewed in electronic medical record.  No pertanent information unless stated regarding to the chief complaint.   Review of Systems:  No headache, visual changes, nausea, vomiting, diarrhea, constipation, dizziness, abdominal pain, skin rash, fevers, chills, night sweats, weight loss, swollen lymph nodes, body aches, joint swelling, chest pain, shortness of breath, mood changes. POSITIVE muscle aches  Objective  Blood pressure 122/70, pulse 80, height 5\' 4"  (1.626 m), weight 198 lb (89.8 kg), SpO2 100 %.   General: No apparent distress alert and oriented x3 mood and affect normal, dressed appropriately.  Overweight  HEENT: Pupils equal, extraocular movements intact  Respiratory: Patient's speak in full sentences and does not appear short of breath  Cardiovascular: Trace lower extremity edema, non tender, no erythema  Gait antalgic gait noted. Patient's left knee exam does show some increasing discomfort and pain.  Patient does have instability with valgus and varus force.  More pain over the medial joint space.  Patient does have a small effusion of the patellofemoral space bilaterally.  Right knee also has some instability on exam today.    Impression and Recommendations:     The above documentation has been reviewed and is accurate and complete Lyndal Pulley, DO

## 2021-02-17 ENCOUNTER — Other Ambulatory Visit: Payer: Self-pay

## 2021-02-17 ENCOUNTER — Ambulatory Visit (INDEPENDENT_AMBULATORY_CARE_PROVIDER_SITE_OTHER): Payer: BC Managed Care – PPO | Admitting: Family Medicine

## 2021-02-17 ENCOUNTER — Encounter: Payer: Self-pay | Admitting: Family Medicine

## 2021-02-17 VITALS — BP 122/70 | HR 80 | Ht 64.0 in | Wt 198.0 lb

## 2021-02-17 DIAGNOSIS — M25562 Pain in left knee: Secondary | ICD-10-CM | POA: Diagnosis not present

## 2021-02-17 DIAGNOSIS — M1712 Unilateral primary osteoarthritis, left knee: Secondary | ICD-10-CM

## 2021-02-17 DIAGNOSIS — G8929 Other chronic pain: Secondary | ICD-10-CM | POA: Diagnosis not present

## 2021-02-17 DIAGNOSIS — M25561 Pain in right knee: Secondary | ICD-10-CM

## 2021-02-17 NOTE — Patient Instructions (Addendum)
Good to see you Surgery is the best chance Dr. Mayer Camel or Berenice Primas [phone] [place] 2 times a day See me again when you need me

## 2021-02-17 NOTE — Assessment & Plan Note (Signed)
Patient does have significant arthritic changes of the left knee as well as the right knee.  Patient is having worsening pain at this time.  Patient has not responded to the injections and is having difficulty with all of the quality of life at this point.  Will refer patient to orthopedic surgery again.  Do feel that this will be the most beneficial for her.  Follow-up with me only on an as needed basis

## 2021-02-21 ENCOUNTER — Encounter: Payer: Self-pay | Admitting: Internal Medicine

## 2021-03-11 ENCOUNTER — Other Ambulatory Visit: Payer: Self-pay

## 2021-03-11 ENCOUNTER — Ambulatory Visit (INDEPENDENT_AMBULATORY_CARE_PROVIDER_SITE_OTHER): Payer: BC Managed Care – PPO | Admitting: Internal Medicine

## 2021-03-11 ENCOUNTER — Encounter: Payer: Self-pay | Admitting: Internal Medicine

## 2021-03-11 VITALS — BP 140/90 | HR 89 | Ht 64.0 in | Wt 197.4 lb

## 2021-03-11 DIAGNOSIS — Z6832 Body mass index (BMI) 32.0-32.9, adult: Secondary | ICD-10-CM

## 2021-03-11 DIAGNOSIS — IMO0002 Reserved for concepts with insufficient information to code with codable children: Secondary | ICD-10-CM

## 2021-03-11 DIAGNOSIS — E1165 Type 2 diabetes mellitus with hyperglycemia: Secondary | ICD-10-CM

## 2021-03-11 DIAGNOSIS — E669 Obesity, unspecified: Secondary | ICD-10-CM | POA: Diagnosis not present

## 2021-03-11 DIAGNOSIS — E785 Hyperlipidemia, unspecified: Secondary | ICD-10-CM

## 2021-03-11 DIAGNOSIS — E11319 Type 2 diabetes mellitus with unspecified diabetic retinopathy without macular edema: Secondary | ICD-10-CM

## 2021-03-11 DIAGNOSIS — Z794 Long term (current) use of insulin: Secondary | ICD-10-CM | POA: Diagnosis not present

## 2021-03-11 DIAGNOSIS — E1169 Type 2 diabetes mellitus with other specified complication: Secondary | ICD-10-CM | POA: Diagnosis not present

## 2021-03-11 MED ORDER — OZEMPIC (0.25 OR 0.5 MG/DOSE) 2 MG/1.5ML ~~LOC~~ SOPN: 0.5000 mg | PEN_INJECTOR | SUBCUTANEOUS | 5 refills | Status: DC

## 2021-03-11 NOTE — Patient Instructions (Addendum)
Please continue: - Metformin 1000 mg 2x a day  Insulin Before breakfast Before lunch Before dinner  R 10 10 20-25  NPH 15 - 20   Please start Ozempic 0.25 mg weekly in a.m. (for example on Sunday morning) x 4 weeks, then increase to 0.5 mg weekly in a.m. if no nausea or hypoglycemia.  Please return in 2 months with your sugar log.

## 2021-03-11 NOTE — Progress Notes (Signed)
Patient ID: [name], female   DOB: 01/30/1961, 60 y.o.   MRN: [id number]  This visit occurred during the SARS-CoV-2 public health emergency.  Safety protocols were in place, including screening questions prior to the visit, additional usage of staff PPE, and extensive cleaning of exam room while observing appropriate contact time as indicated for disinfecting solutions.   HPI: [name] is a 60 y.o.-year-old female, returning for f/u for DM2, dx ~2000, insulin-dependent since 2010, uncontrolled, with complications (PN, + DR). Last visit 1.5 months ago. She is not seeing Dr. Derrel Nip anymore as she does not want to drive to [facility].  Interim history: At this visit, she still complains of generalized aches and pains, including knees and shoulders/upper arms. Sugars have been higher as she contacted me approximately 3 weeks ago about these.  At that time, she was having a fever.  I replied to her message advising her about managing the blood sugars, but she did not read the message...  Reviewed HbA1c levels: Lab Results  Component Value Date   HGBA1C 7.3 (A) 01/24/2021   HGBA1C 7.1 (A) 09/13/2020   HGBA1C 6.4 (A) 05/13/2020   HGBA1C 7.6 (A) 08/31/2019   HGBA1C 7.8 (A) 11/25/2018   HGBA1C 6.8 (A) 07/25/2018   HGBA1C 6.4% 03/03/2018   HGBA1C 6.5 11/01/2017   HGBA1C 6.2 07/23/2017   HGBA1C 6.8 04/09/2017   HGBA1C 6.4 01/08/2017   HGBA1C 6.4 10/08/2016   HGBA1C 10.4 07/07/2016   HGBA1C 10.4 04/07/2016   HGBA1C 9.3 01/14/2016   HGBA1C 10.9 (H) 09/25/2015   HGBA1C 11.2 09/19/2015   HGBA1C 7.7 06/21/2015   HGBA1C 7.9 (H) 03/22/2015   HGBA1C 9.4 (H) 01/04/2015   HGBA1C 8.8 (H) 10/04/2014   HGBA1C 10.3 (H) 07/06/2014   HGBA1C 7.0 (H) 03/26/2014   HGBA1C 9.5 (H) 12/26/2013   HGBA1C 7.5 (H) 09/20/2013   HGBA1C 8.5 (H) 06/19/2013   HGBA1C 10.3 (H) 01/31/2013   HGBA1C 10.5 (H) 05/27/2012   HGBA1C 8.4 (H) 11/20/2011   HGBA1C 9.5 (H) 08/11/2011   Pt is on:  Insulin Before  breakfast Before lunch Before dinner  R 10-15 >> 10 10-15 >> 10 25 >> actually using 20  NPH 15 - 25 >> 20 >> actually using 20-25  - Metformin 1000 mg twice a day She could not start Ozempic 0.5 mg weekly - recommended 12/2020.  Pt is checking her sugars once a day, but she cannot remember the values well and she does not have her meter or log with her... - am: 69, 89-144, 216 >> 114-160, 245 (forgot insulin) >> 92-154 >> 148 - 2h after b'fast: 154 >> n/c > 203 >> n/c >> 223 >> n/c - lunch: 81, 87 >> 100-130 >> 110-120 >> ? - mid-afternoon:  248 >> 116, 189 >> n/c - before dinner: 135 >> 108-114 >> n/c  - 2h after dinner:  216, 219, 451 >> n/c  - bedtime:  104-159 >> 150-180 >> 174-200 >> ? - nighttime:  n/c >> 188, 200, 497 >> n/c Lowest sugar was 80s >> 29 (?) in 11/2020; she has hypoglycemia awareness in the 80s. Highest sugar was  497 ...>> 200s  -No CKD, last BUN/creatinine:  Lab Results  Component Value Date   BUN 18 01/24/2021   CREATININE 0.65 01/24/2021   ACR was normal: Lab Results  Component Value Date   MICRALBCREAT 3 01/24/2021   MICRALBCREAT 1.8 06/27/2018   MICRALBCREAT 1.6 06/23/2017   MICRALBCREAT 0.7 06/19/2016   MICRALBCREAT  0.4 09/25/2015   MICRALBCREAT 0.3 07/30/2014   MICRALBCREAT 0.5 06/19/2013   MICRALBCREAT 1.5 01/31/2013   MICRALBCREAT 2.6 11/20/2011  Not on ACE inhibitor/ARB  -+ HL; last set of lipids: Lab Results  Component Value Date   CHOL 124 01/24/2021   HDL 51 01/24/2021   LDLCALC 55 01/24/2021   LDLDIRECT 94.0 09/25/2015   TRIG 99 01/24/2021   CHOLHDL 2.4 01/24/2021  On Lipitor 40, Zetia 10.  - last eye exam was in 12/2018: + DR OU, reportedly glaucoma. She sees Dr. Arnoldo Morale.  -She has numbness and tingling in her feet.  She is on Neurontin.  She sees podiatry.  She also has a history of HTN, episodic alcohol abuse, GERD, low back pain, OA.   No h/o pancreatitis or FH MTC.  ROS: Constitutional: no weight gain/no weight loss,  no fatigue, no subjective hyperthermia, no subjective hypothermia Eyes: no blurry vision, no xerophthalmia ENT: no sore throat, no nodules palpated in neck, no dysphagia, no odynophagia, no hoarseness Cardiovascular: no CP/no SOB/no palpitations/no leg swelling Respiratory: no cough/no SOB/no wheezing Gastrointestinal: no N/no V/no D/no C/no acid reflux Musculoskeletal: + muscle aches/+ joint aches Skin: no rashes, no hair loss Neurological: no tremors/+ numbness/+ tingling/no dizziness  I reviewed pt's medications, allergies, PMH, social hx, family hx, and changes were documented in the history of present illness. Otherwise, unchanged from my initial visit note.  Past Medical History:  Diagnosis Date  . Arthritis   . Campylobacter enteritis May 2012   [facility] admission  . Carpal tunnel syndrome, bilateral   . Diabetes mellitus   . Gastritis with bleeding due to alcohol 2010   [facility] admission  . GERD (gastroesophageal reflux disease)   . Hyperlipidemia   . Hypertension    no meds  . Menopause   . Myocardial infarction ([place])   . Neck pain   . Wears glasses    Past Surgical History:  Procedure Laterality Date  . CARPAL TUNNEL RELEASE Left 05/14/2014   Procedure: LEFT ULNAR NEUROPLASTY AT ELBOW AND ENDOSCOPIC CARPAL TUNNEL RELEASE;  Surgeon: Jolyn Nap, MD;  Location: [place];  Service: Orthopedics;  Laterality: Left;  . CORONARY STENT INTERVENTION N/A 10/09/2019   Procedure: CORONARY STENT INTERVENTION;  Surgeon: Adrian Prows, MD;  Location: [place] [facility];  Service: Cardiovascular;  Laterality: N/A;  . CYST EXCISION  1995   under arms  . endometrial biopsy  2004   benign  . FOOT OSTEOTOMY  2010   right-spurs  . LEFT HEART CATH AND CORONARY ANGIOGRAPHY N/A 10/09/2019   Procedure: LEFT HEART CATH AND CORONARY ANGIOGRAPHY;  Surgeon: Adrian Prows, MD;  Location: [place] [facility];  Service: Cardiovascular;  Laterality: N/A;  . TONSILLECTOMY    . ULNAR NERVE  TRANSPOSITION Left 05/14/2014   Procedure: ULNAR NERVE DECOMPRESSION/TRANSPOSITION;  Surgeon: Jolyn Nap, MD;  Location: [place];  Service: Orthopedics;  Laterality: Left;   Social History   Socioeconomic History  . Marital status: Single    Spouse name: Not on file  . Number of children: 2  . Years of education: 68  . Highest education level: High school graduate  Occupational History  . Not on file  Tobacco Use  . Smoking status: Never Smoker  . Smokeless tobacco: Never Used  Vaping Use  . Vaping Use: Never used  Substance and Sexual Activity  . Alcohol use: Not Currently  . Drug use: Yes    Types: Marijuana    Comment:  sometimes  . Sexual activity: Yes    Birth control/protection: None    Comment: same partner x 10 years  Other Topics Concern  . Not on file  Social History Narrative   She works as a Sports coach.   Right-handed.   One cup caffeine per day.   She lives at home with daughter.   Social Determinants of Health   Financial Resource Strain: Not on file  Food Insecurity: Not on file  Transportation Needs: Not on file  Physical Activity: Not on file  Stress: Not on file  Social Connections: Not on file  Intimate Partner Violence: Not on file   Current Outpatient Medications on File Prior to Visit  Medication Sig Dispense Refill  . amLODipine-valsartan (EXFORGE) 5-160 MG tablet Take 1 tablet by mouth every evening. 30 tablet 3  . aspirin 81 MG chewable tablet Chew 1 tablet (81 mg total) by mouth daily.    Marland Kitchen atorvastatin (LIPITOR) 40 MG tablet Take 1 tablet (40 mg total) by mouth daily. 90 tablet 3  . buPROPion (WELLBUTRIN SR) 150 MG 12 hr tablet Take 1 tablet (150 mg total) by mouth 2 (two) times daily at 10 am and 4 pm. Start tablet daily for 3 days 60 tablet 2  . cetirizine (ZYRTEC ALLERGY) 10 MG tablet Take 1 tablet (10 mg total) by mouth daily. 30 tablet 0  . clindamycin (CLEOCIN) 300 MG capsule Take 300 mg by mouth every 6 (six)  hours.    . Continuous Blood Gluc Receiver (FREESTYLE LIBRE 2 READER) DEVI 1 each by Does not apply route daily. 1 each 0  . Continuous Blood Gluc Sensor (FREESTYLE LIBRE 2 SENSOR) MISC 1 each by Does not apply route every 14 (fourteen) days. 6 each 3  . ezetimibe (ZETIA) 10 MG tablet Take 1 tablet (10 mg total) by mouth daily after supper. 90 tablet 3  . gabapentin (NEURONTIN) 300 MG capsule Take 1 capsule (300 mg total) by mouth 3 (three) times daily. 90 capsule 3  . glucose blood (BAYER CONTOUR TEST) test strip TEST BLOOD SUGARS 3 TIMES DAILY 200 each 11  . insulin NPH Human (NOVOLIN N RELION) 100 UNIT/ML injection Inject 0.2-0.55 mLs (20-55 Units total) into the skin 2 (two) times daily before a meal. 99 mL 6  . insulin regular (NOVOLIN R RELION) 100 units/mL injection Inject 0.1-0.25 mLs (10-25 Units total) into the skin 3 (three) times daily before meals. 67.5 mL 1  . Lancet Devices (EASY TOUCH LANCING DEVICE) MISC Use to check diabetes 4 times daily  E 11.65 1 each 11  . latanoprost (XALATAN) 0.005 % ophthalmic solution Place 1 drop into both eyes at bedtime.    . metFORMIN (GLUCOPHAGE) 1000 MG tablet Take 1 tablet (1,000 mg total) by mouth 2 (two) times daily with a meal. 180 tablet 3  . metoprolol succinate (TOPROL-XL) 50 MG 24 hr tablet TAKE 1 TABLET BY MOUTH ONCE DAILY. TAKE WITH OR IMMEDIATELY FOLLOWING A MEAL 30 tablet 3  . Multiple Vitamin (MULTIVITAMIN) tablet Take 1 tablet by mouth daily.    . naloxone (NARCAN) nasal spray 4 mg/0.1 mL Place 1 spray into the nose as needed.    . nystatin (NYSTATIN) powder Apply powder to affected area twice daily until rash has resolved 15 g 3  . omeprazole (PRILOSEC) 40 MG capsule Take 1 capsule by mouth once daily 90 capsule 0  . ondansetron (ZOFRAN ODT) 4 MG disintegrating tablet Take 1 tablet (4 mg total) by mouth every 8 (  eight) hours as needed for nausea or vomiting. 20 tablet 0  . oxyCODONE-acetaminophen (PERCOCET) 7.5-325 MG tablet Take 1  tablet by mouth 4 (four) times daily as needed.    . Semaglutide,0.25 or 0.5MG /DOS, (OZEMPIC, 0.25 OR 0.5 MG/DOSE,) 2 MG/1.5ML SOPN Inject 0.5 mg into the skin once a week. 6 mL 5  . sulfamethoxazole-trimethoprim (BACTRIM DS) 800-160 MG tablet Take 1 tablet by mouth 2 (two) times daily.    Marland Kitchen tiZANidine (ZANAFLEX) 4 MG tablet Take 4 mg by mouth 3 (three) times daily.    . vitamin B-12 (CYANOCOBALAMIN) 1000 MCG tablet Take 1,000 mcg by mouth daily.     No current facility-administered medications on file prior to visit.   Allergies  Allergen Reactions  . Oxycodone-Acetaminophen Other (See Comments)    Causes her to feel "hot".  . Penicillins Rash    Did it involve swelling of the face/tongue/throat, SOB, or low BP? No Did it involve sudden or severe rash/hives, skin peeling, or any reaction on the inside of your mouth or nose? No Did you need to seek medical attention at a hospital or doctor's office? No When did it last happen? If all above answers are "NO", may proceed with cephalosporin use.   Family History  Problem Relation Age of Onset  . Hypertension Mother   . Diabetes Mother   . Heart attack Mother   . Stroke Father   . Diabetes Father   . Diabetes Maternal Grandmother   . Hypertension Maternal Grandmother   . Heart attack Sister     PE: BP 140/90 (BP Location: Right Arm, Patient Position: Sitting, Cuff Size: Normal)   Pulse 89   Ht 5\' 4"  (1.626 m)   Wt 197 lb 6.4 oz (89.5 kg)   SpO2 97%   BMI 33.88 kg/m  Body mass index is 33.88 kg/m. Wt Readings from Last 3 Encounters:  03/11/21 197 lb 6.4 oz (89.5 kg)  02/17/21 198 lb (89.8 kg)  01/24/21 197 lb (89.4 kg)   Constitutional: overweight, in NAD Eyes: PERRLA, EOMI, no exophthalmos ENT: moist mucous membranes, no thyromegaly, no cervical lymphadenopathy Cardiovascular: RRR, No MRG Respiratory: CTA B Gastrointestinal: abdomen soft, NT, ND, BS+ Musculoskeletal: no deformities, strength intact in all 4 Skin:  moist, warm, no rashes Neurological: no tremor with outstretched hands, DTR normal in all 4  ASSESSMENT: 1. DM2, insulin-dependent, uncontrolled, with complications - peripheral neuropathy - DR   2. Obesity class 1 - BMI 32.79  BMI Classification:  < 18.5 underweight   18.5-24.9 normal weight   25.0-29.9 overweight   30.0-34.9 class I obesity   35.0-39.9 class II obesity   ? 40.0 class III obesity   3. HL  PLAN:  1. Patient with longstanding, uncontrolled, type 2 diabetes, with improved control in the last 3 years after she decided to take her insulin consistently.  She had an NSTEMI in 10/2019 with 3 subsequent stents and had cardiac rehab afterwards.  Sugars improved and HbA1c decreased to 6.4%.  At last visit, however, she was having low blood sugars, as low as 29 reportedly and she decreased her insulin slightly afterwards.  I suggested a GLP-1 receptor agonist and advised her to decrease the regular insulin doses even more after starting this.  Discussed about benefits especially in the setting of her heart disease and also possible side effects.  HbA1c was higher at that time, at 7.3%. -She did contact me recently with high blood sugars in the setting of fever and I advised  her to increase her insulin doses.  However, she did not read the message.  -At this visit, she tells me that her sugars are still high.  She was not able to get the Ozempic or the CGM.  However, it appears that she tried to obtain them together and they were expensive but it is unclear which of the above was unaffordable.  I advised her to try again to obtain the Ozempic, especially since she tells me that overall, her sugars are higher.  I advised her to let me know right away if this is not affordable and will need to increase her insulin doses.  However, I would like to have more data about her blood sugars before doing so. -At this visit, she tells me that she is actually using a fluctuating dose of NPH at  night and a fixed dose of Armour, which is opposite to what I advised her to do at last visit.  I advised her to vary the dose of regular insulin at night and to keep the NPH dose stable. - I advised her to: Patient Instructions   Please continue: - Metformin 1000 mg 2x a day  Insulin Before breakfast Before lunch Before dinner  R 10 10 20-25  NPH 15 - 20   Please start Ozempic 0.25 mg weekly in a.m. (for example on Sunday morning) x 4 weeks, then increase to 0.5 mg weekly in a.m. if no nausea or hypoglycemia.  Please return in 2 months with your sugar log.  - advised to check sugars at different times of the day - 4x a day, rotating check times - advised for yearly eye exams >> she is not UTD - return to clinic in 2 months    2. Obesity class 1 -We will continue Metformin which is weight stabilizing long-term; at last visit, we also started a GLP-1 receptor agonist which should also help with weight loss -She lost 1 pound before last visit, and weight stable since then.  3. HL -Reviewed latest lipid panel from 12/2020: All fractions at goal: Lab Results  Component Value Date   CHOL 124 01/24/2021   HDL 51 01/24/2021   LDLCALC 55 01/24/2021   LDLDIRECT 94.0 09/25/2015   TRIG 99 01/24/2021   CHOLHDL 2.4 01/24/2021  -On Lipitor 40 mg daily and Zetia 10 mg daily, without side effects   Philemon Kingdom, MD PhD [facility] End[facility]

## 2021-03-27 ENCOUNTER — Encounter: Payer: Self-pay | Admitting: Cardiology

## 2021-03-27 ENCOUNTER — Other Ambulatory Visit: Payer: Self-pay

## 2021-03-27 ENCOUNTER — Ambulatory Visit: Payer: BC Managed Care – PPO | Admitting: Cardiology

## 2021-03-27 VITALS — BP 134/76 | HR 76 | Temp 97.3°F | Resp 16 | Ht 64.0 in | Wt 192.0 lb

## 2021-03-27 DIAGNOSIS — K219 Gastro-esophageal reflux disease without esophagitis: Secondary | ICD-10-CM

## 2021-03-27 DIAGNOSIS — I10 Essential (primary) hypertension: Secondary | ICD-10-CM

## 2021-03-27 DIAGNOSIS — F17200 Nicotine dependence, unspecified, uncomplicated: Secondary | ICD-10-CM

## 2021-03-27 DIAGNOSIS — E782 Mixed hyperlipidemia: Secondary | ICD-10-CM

## 2021-03-27 MED ORDER — DEXLANSOPRAZOLE 30 MG PO CPDR: 30.0000 mg | DELAYED_RELEASE_CAPSULE | Freq: Every day | ORAL | 2 refills | Status: DC

## 2021-03-27 NOTE — Progress Notes (Signed)
Primary Physician/Referring:  Carylon Perches, NP  Patient ID: [name], female    DOB: [birth year], 60 y.o.   MRN: [id number]  Chief Complaint  Patient presents with  . Hypertension  . Follow-up    3 months  . Hyperlipidemia   HPI:    [name]  is a 60 y.o. AAF  with diabetes mellitus, hyperlipidemia, prior cigarette smoking quit in 2013, smokes marijuana occasionally, CAD  Presenting with NSTEMI, S/P stenting to mid right and also to the distal LAD by implantation of DES on 10/09/2019.  She has not had any further chest pain.   She presents here for 93-month office visit, she has not had any recurrence of angina pectoris.  Unfortunately has continued to smoke.  I had prescribed her Wellbutrin which he has not started.  States that over the past 1 month she has been having frequent episodes of sour eructation and GERD and request medication.  Is presently on omeprazole.  No dark stools or bloody stools.    Past Medical History:  Diagnosis Date  . Arthritis   . Campylobacter enteritis May 2012   [facility] admission  . Carpal tunnel syndrome, bilateral   . Diabetes mellitus   . Gastritis with bleeding due to alcohol 2010   [facility] admission  . GERD (gastroesophageal reflux disease)   . Hyperlipidemia   . Hypertension    no meds  . Menopause   . Myocardial infarction ([place])   . Neck pain   . Wears glasses    Past Surgical History:  Procedure Laterality Date  . CARPAL TUNNEL RELEASE Left 05/14/2014   Procedure: LEFT ULNAR NEUROPLASTY AT ELBOW AND ENDOSCOPIC CARPAL TUNNEL RELEASE;  Surgeon: Jolyn Nap, MD;  Location: [place];  Service: Orthopedics;  Laterality: Left;  . CORONARY STENT INTERVENTION N/A 10/09/2019   Procedure: CORONARY STENT INTERVENTION;  Surgeon: Adrian Prows, MD;  Location: [place] [facility];  Service: Cardiovascular;  Laterality: N/A;  . CYST EXCISION  1995   under arms  . endometrial biopsy  2004   benign  . FOOT OSTEOTOMY  2010    right-spurs  . LEFT HEART CATH AND CORONARY ANGIOGRAPHY N/A 10/09/2019   Procedure: LEFT HEART CATH AND CORONARY ANGIOGRAPHY;  Surgeon: Adrian Prows, MD;  Location: [place] [facility];  Service: Cardiovascular;  Laterality: N/A;  . TONSILLECTOMY    . ULNAR NERVE TRANSPOSITION Left 05/14/2014   Procedure: ULNAR NERVE DECOMPRESSION/TRANSPOSITION;  Surgeon: Jolyn Nap, MD;  Location: [place];  Service: Orthopedics;  Laterality: Left;   Social History   Tobacco Use  . Smoking status: Never Smoker  . Smokeless tobacco: Never Used  Substance Use Topics  . Alcohol use: Not Currently   Marital Status: Single   ROS  Review of Systems  Cardiovascular: Negative for dyspnea on exertion and leg swelling.  Musculoskeletal: Positive for arthritis, joint pain and neck pain (right side of the neck and radiation to arm).  Gastrointestinal: Positive for heartburn. Negative for melena.  Neurological: Positive for paresthesias.   Objective   Vitals with BMI 03/27/2021 03/11/2021 02/17/2021  Height 5\' 4"  5\' 4"  5\' 4"   Weight 192 lbs 197 lbs 6 oz 198 lbs  BMI 32.94 99991111 [id number]  Systolic [id number]  Diastolic 76 90 70  Pulse 76 89 80    Physical Exam Constitutional:      Comments: She is moderately built and mildly obese in no acute distress.  Cardiovascular:  Rate and Rhythm: Normal rate and regular rhythm.     Pulses: Intact distal pulses.          Carotid pulses are 2+ on the right side and 2+ on the left side.      Femoral pulses are 2+ on the right side and 2+ on the left side.      Dorsalis pedis pulses are 2+ on the right side and 2+ on the left side.       Posterior tibial pulses are 2+ on the right side and 2+ on the left side.     Heart sounds: Normal heart sounds. No murmur heard. No gallop.      Comments: No leg edema, no JVD. Pulmonary:     Effort: Pulmonary effort is normal.     Breath sounds: Normal breath sounds.  Abdominal:     General: Bowel sounds are  normal.     Palpations: Abdomen is soft.    Laboratory examination:   Recent Labs    01/24/21 1606  NA 142  K 4.3  CL 105  CO2 27  GLUCOSE 73  BUN 18  CREATININE 0.65  CALCIUM 10.1  GFRNONAA 96  GFRAA 111   CrCl cannot be calculated (Patient's most recent lab result is older than the maximum 21 days allowed.).  CMP Latest Ref Rng & Units 01/24/2021 12/08/2019 10/14/2019  Glucose 65 - 99 mg/dL 73 198(H) 180(H)  BUN 7 - 25 mg/dL 18 15 12   Creatinine 0.50 - 0.99 mg/dL 0.65 0.74 0.57  Sodium 135 - 146 mmol/L 142 141 136  Potassium 3.5 - 5.3 mmol/L 4.3 4.4 4.0  Chloride 98 - 110 mmol/L 105 104 102  CO2 20 - 32 mmol/L 27 24 24   Calcium 8.6 - 10.4 mg/dL 10.1 9.8 8.7(L)  Total Protein 6.1 - 8.1 g/dL 7.3 7.2 -  Total Bilirubin 0.2 - 1.2 mg/dL 0.4 0.3 -  Alkaline Phos 39 - 117 IU/L - 125(H) -  AST 10 - 35 U/L 18 15 -  ALT 6 - 29 U/L 19 22 -   CBC Latest Ref Rng & Units 12/08/2019 10/14/2019 10/10/2019  WBC 3.4 - 10.8 x10E3/uL 9.8 9.7 8.3  Hemoglobin 11.1 - 15.9 g/dL 12.6 11.2(L) 12.1  Hematocrit 34.0 - 46.6 % 38.3 34.8(L) 37.0  Platelets 150 - 450 x10E3/uL 379 306 273   Lipid Panel Recent Labs    04/26/20 0823 01/24/21 1606  CHOL 110 124  TRIG 62 99  LDLCALC 45 55  HDL 51 51  CHOLHDL  --  2.4     HEMOGLOBIN A1C Lab Results  Component Value Date   HGBA1C 7.3 (A) 01/24/2021   TSH No results for input(s): TSH in the last 8760 hours. Medications and allergies   Allergies  Allergen Reactions  . Oxycodone-Acetaminophen Other (See Comments)    Causes her to feel "hot".  . Penicillins Rash    Did it involve swelling of the face/tongue/throat, SOB, or low BP? No Did it involve sudden or severe rash/hives, skin peeling, or any reaction on the inside of your mouth or nose? No Did you need to seek medical attention at a hospital or doctor's office? No When did it last happen? If all above answers are "NO", may proceed with cephalosporin use.    Outpatient  Medications Prior to Visit  Medication Sig Dispense Refill  . amLODipine-valsartan (EXFORGE) 5-160 MG tablet Take 1 tablet by mouth every evening. 30 tablet 3  . aspirin 81 MG chewable tablet  Chew 1 tablet (81 mg total) by mouth daily.    Marland Kitchen atorvastatin (LIPITOR) 40 MG tablet Take 1 tablet (40 mg total) by mouth daily. 90 tablet 3  . cetirizine (ZYRTEC ALLERGY) 10 MG tablet Take 1 tablet (10 mg total) by mouth daily. 30 tablet 0  . clindamycin (CLEOCIN) 300 MG capsule Take 300 mg by mouth every 6 (six) hours.    . Continuous Blood Gluc Receiver (FREESTYLE LIBRE 2 READER) DEVI 1 each by Does not apply route daily. 1 each 0  . Continuous Blood Gluc Sensor (FREESTYLE LIBRE 2 SENSOR) MISC 1 each by Does not apply route every 14 (fourteen) days. 6 each 3  . ezetimibe (ZETIA) 10 MG tablet Take 1 tablet (10 mg total) by mouth daily after supper. 90 tablet 3  . gabapentin (NEURONTIN) 300 MG capsule Take 1 capsule (300 mg total) by mouth 3 (three) times daily. 90 capsule 3  . glucose blood (BAYER CONTOUR TEST) test strip TEST BLOOD SUGARS 3 TIMES DAILY 200 each 11  . insulin NPH Human (NOVOLIN N RELION) 100 UNIT/ML injection Inject 0.2-0.55 mLs (20-55 Units total) into the skin 2 (two) times daily before a meal. 99 mL 6  . insulin regular (NOVOLIN R RELION) 100 units/mL injection Inject 0.1-0.25 mLs (10-25 Units total) into the skin 3 (three) times daily before meals. 67.5 mL 1  . Lancet Devices (EASY TOUCH LANCING DEVICE) MISC Use to check diabetes 4 times daily  E 11.65 1 each 11  . latanoprost (XALATAN) 0.005 % ophthalmic solution Place 1 drop into both eyes at bedtime.    . metFORMIN (GLUCOPHAGE) 1000 MG tablet Take 1 tablet (1,000 mg total) by mouth 2 (two) times daily with a meal. 180 tablet 3  . metoprolol succinate (TOPROL-XL) 50 MG 24 hr tablet TAKE 1 TABLET BY MOUTH ONCE DAILY. TAKE WITH OR IMMEDIATELY FOLLOWING A MEAL 30 tablet 3  . Multiple Vitamin (MULTIVITAMIN) tablet Take 1 tablet by mouth  daily.    . naloxone (NARCAN) nasal spray 4 mg/0.1 mL Place 1 spray into the nose as needed.    . nystatin (NYSTATIN) powder Apply powder to affected area twice daily until rash has resolved 15 g 3  . ondansetron (ZOFRAN ODT) 4 MG disintegrating tablet Take 1 tablet (4 mg total) by mouth every 8 (eight) hours as needed for nausea or vomiting. 20 tablet 0  . oxyCODONE-acetaminophen (PERCOCET) 7.5-325 MG tablet Take 1 tablet by mouth 4 (four) times daily as needed.    . Semaglutide,0.25 or 0.5MG /DOS, (OZEMPIC, 0.25 OR 0.5 MG/DOSE,) 2 MG/1.5ML SOPN Inject 0.5 mg into the skin once a week. 6 mL 5  . sulfamethoxazole-trimethoprim (BACTRIM DS) 800-160 MG tablet Take 1 tablet by mouth 2 (two) times daily.    Marland Kitchen tiZANidine (ZANAFLEX) 4 MG tablet Take 4 mg by mouth 3 (three) times daily.    . vitamin B-12 (CYANOCOBALAMIN) 1000 MCG tablet Take 1,000 mcg by mouth daily.    Marland Kitchen omeprazole (PRILOSEC) 40 MG capsule Take 1 capsule by mouth once daily 90 capsule 0  . buPROPion (WELLBUTRIN SR) 150 MG 12 hr tablet Take 1 tablet (150 mg total) by mouth 2 (two) times daily at 10 am and 4 pm. Start tablet daily for 3 days (Patient not taking: Reported on 03/27/2021) 60 tablet 2   No facility-administered medications prior to visit.   Meds ordered this encounter  Medications  . Dexlansoprazole 30 MG capsule    Sig: Take 1 capsule (30 mg total) by  mouth daily.    Dispense:  30 capsule    Refill:  2   Medications Discontinued During This Encounter  Medication Reason  . omeprazole (PRILOSEC) 40 MG capsule Ineffective     Radiology:   CXR 10/08/2019:  The heart size and mediastinal contours are within normal limits. Both lungs are clear. The visualized skeletal structures are Unremarkable.  Cardiac Studies:   Echocardiogram 10/09/2019:   1. Left ventricular ejection fraction, by visual estimation, is 50 to 55%. The left ventricle has low normal function. There is mildly increased left ventricular hypertrophy.  Basal to mid inferolateral hypokinesis  2. The average left ventricular global longitudinal strain is -14.9 %.  3. Global right ventricle has normal systolic function.The right ventricular size is normal. No increase in right ventricular wall thickness.  Left heart catheterization 10/09/2019: Hyperdynamic LV, EF 65 to 70%, EDP normal. RCA is a dominant vessel. Ulcerated tandem 80% followed by a 99% stenosis. SP 2.5 x 38 and a 2.5 x 8 mm resolute Onyx DES stents implanted, second stent used for distal edge dissection. Stenosis reduced to 0% with TIMI-3 to TIMI-3 flow. Circumflex coronary artery is smooth with minimal disease. Gives origin to large OM1 and OM 2. Tortuous. LAD is diffusely diseased from the mid to distal segment. There is a hazy 70 to 80% stenosis in the mid LAD. Following which distal LAD had a 40% diffuse disease. Mild calcification is evident. SP 2.24 x 15 mm resolute Onyx DES postdilated with 2.25 x 8 mm [place] at 22 atmospheric pressure with 0% residual stenosis. TIMI-3 to TIMI-3 flow.  Carotid artery duplex  08/13/2020: Minimal stenosis in the left internal carotid artery (1-15%). Stenosis in the left common carotid artery (<50%). Antegrade right vertebral artery flow. Antegrade left vertebral artery flow. Follow up studies if clinically indicated.  EKG:   EKG 01/23/2021: Sinus rhythm with short PR interval at rate of 87 bpm, normal axis, otherwise normal EKG.    Assessment     ICD-10-CM   1. Essential hypertension  I10   2. Tobacco use disorder  F17.200   3. Gastroesophageal reflux disease without esophagitis  K21.9 Dexlansoprazole 30 MG capsule  4. Mixed hyperlipidemia  E78.2     Meds ordered this encounter  Medications  . Dexlansoprazole 30 MG capsule    Sig: Take 1 capsule (30 mg total) by mouth daily.    Dispense:  30 capsule    Refill:  2   Medications Discontinued During This Encounter  Medication Reason  . omeprazole (PRILOSEC) 40 MG  capsule Ineffective   No orders of the defined types were placed in this encounter.    Recommendations:   Meds ordered this encounter  Medications  . Dexlansoprazole 30 MG capsule    Sig: Take 1 capsule (30 mg total) by mouth daily.    Dispense:  30 capsule    Refill:  2    Chrishawn C Vanecek  is a 60 y.o. AAF  with diabetes mellitus, hyperlipidemia, prior cigarette smoking quit in 2013, smokes marijuana occasionally, CAD  Presenting with NSTEMI, S/P stenting to mid right and also to the distal LAD by implantation of DES on 10/09/2019.  She has not had any further chest pain but continues to have  pain in her neck and also her arms constantly which is clearly radicular pain.  Since last office visit 2 months ago symptoms are improved.  She is presently doing well and has not had recurrence of angina pectoris.  Since  addition of Exforge, blood pressure is now well controlled.  Continue the same. We discussed regarding tobacco use disorder again, I have prescribed her Wellbutrin which she has not started yet.  Encouraged her to restart this medication as it may help with her weight loss as well.  She would like to make up her mind first before starting the medication.  She has developed significant symptoms of GERD, will discontinue omeprazole and switch her to [place].  Her labs were reviewed, lipids are excellent control, renal function is normal.  No changes in the medications were done today otherwise.  I will see him back in 6 months.    Adrian Prows, MD, [facility] 03/27/2021, 3:37 PM Office: [phone]

## 2021-05-24 ENCOUNTER — Other Ambulatory Visit: Payer: Self-pay | Admitting: Internal Medicine

## 2021-06-28 ENCOUNTER — Emergency Department (HOSPITAL_COMMUNITY): Payer: BC Managed Care – PPO

## 2021-06-28 ENCOUNTER — Emergency Department (HOSPITAL_COMMUNITY)
Admission: EM | Admit: 2021-06-28 | Discharge: 2021-06-28 | Disposition: A | Discharge status: Home / Self Care | Payer: BC Managed Care – PPO | Attending: Emergency Medicine | Admitting: Emergency Medicine

## 2021-06-28 ENCOUNTER — Other Ambulatory Visit: Payer: Self-pay

## 2021-06-28 ENCOUNTER — Encounter (HOSPITAL_COMMUNITY): Payer: Self-pay | Admitting: Emergency Medicine

## 2021-06-28 DIAGNOSIS — Z79899 Other long term (current) drug therapy: Secondary | ICD-10-CM | POA: Insufficient documentation

## 2021-06-28 DIAGNOSIS — R1084 Generalized abdominal pain: Secondary | ICD-10-CM

## 2021-06-28 DIAGNOSIS — I10 Essential (primary) hypertension: Secondary | ICD-10-CM | POA: Diagnosis not present

## 2021-06-28 DIAGNOSIS — R112 Nausea with vomiting, unspecified: Secondary | ICD-10-CM

## 2021-06-28 DIAGNOSIS — E11319 Type 2 diabetes mellitus with unspecified diabetic retinopathy without macular edema: Secondary | ICD-10-CM | POA: Insufficient documentation

## 2021-06-28 DIAGNOSIS — Z7982 Long term (current) use of aspirin: Secondary | ICD-10-CM | POA: Insufficient documentation

## 2021-06-28 DIAGNOSIS — K219 Gastro-esophageal reflux disease without esophagitis: Secondary | ICD-10-CM | POA: Diagnosis not present

## 2021-06-28 DIAGNOSIS — R1110 Vomiting, unspecified: Secondary | ICD-10-CM

## 2021-06-28 DIAGNOSIS — I2510 Atherosclerotic heart disease of native coronary artery without angina pectoris: Secondary | ICD-10-CM | POA: Insufficient documentation

## 2021-06-28 DIAGNOSIS — Z794 Long term (current) use of insulin: Secondary | ICD-10-CM | POA: Insufficient documentation

## 2021-06-28 DIAGNOSIS — Z7984 Long term (current) use of oral hypoglycemic drugs: Secondary | ICD-10-CM | POA: Diagnosis not present

## 2021-06-28 LAB — CBC WITH DIFFERENTIAL/PLATELET
Abs Immature Granulocytes: 0.03 10*3/uL (ref 0.00–0.07)
Basophils Absolute: 0 10*3/uL (ref 0.0–0.1)
Basophils Relative: 0 %
Eosinophils Absolute: 0 10*3/uL (ref 0.0–0.5)
Eosinophils Relative: 0 %
HCT: 43.3 % (ref 36.0–46.0)
Hemoglobin: 13.9 g/dL (ref 12.0–15.0)
Immature Granulocytes: 0 %
Lymphocytes Relative: 18 %
Lymphs Abs: 2 10*3/uL (ref 0.7–4.0)
MCH: 29.1 pg (ref 26.0–34.0)
MCHC: 32.1 g/dL (ref 30.0–36.0)
MCV: 90.6 fL (ref 80.0–100.0)
Monocytes Absolute: 0.5 10*3/uL (ref 0.1–1.0)
Monocytes Relative: 4 %
Neutro Abs: 8.7 10*3/uL — ABNORMAL HIGH (ref 1.7–7.7)
Neutrophils Relative %: 78 %
Platelets: 421 10*3/uL — ABNORMAL HIGH (ref 150–400)
RBC: 4.78 MIL/uL (ref 3.87–5.11)
RDW: 12.8 % (ref 11.5–15.5)
WBC: 11.3 10*3/uL — ABNORMAL HIGH (ref 4.0–10.5)
nRBC: 0 % (ref 0.0–0.2)

## 2021-06-28 LAB — LIPASE, BLOOD: Lipase: 70 U/L — ABNORMAL HIGH (ref 11–51)

## 2021-06-28 LAB — TROPONIN I (HIGH SENSITIVITY)
Troponin I (High Sensitivity): 6 ng/L (ref <18)
Troponin I (High Sensitivity): 5 ng/L (ref <18)

## 2021-06-28 LAB — COMPREHENSIVE METABOLIC PANEL
ALT: 26 U/L (ref 0–44)
AST: 21 U/L (ref 15–41)
Albumin: 4.1 g/dL (ref 3.5–5.0)
Alkaline Phosphatase: 92 U/L (ref 38–126)
Anion gap: 11 (ref 5–15)
BUN: 14 mg/dL (ref 8–23)
CO2: 27 mmol/L (ref 22–32)
Calcium: 9.8 mg/dL (ref 8.9–10.3)
Chloride: 98 mmol/L (ref 98–111)
Creatinine, Ser: 0.79 mg/dL (ref 0.44–1.00)
GFR, Estimated: >60 mL/min (ref >60)
Glucose, Bld: 169 mg/dL — ABNORMAL HIGH (ref 70–99)
Potassium: 3.7 mmol/L (ref 3.5–5.1)
Sodium: 136 mmol/L (ref 135–145)
Total Bilirubin: 1.1 mg/dL (ref 0.3–1.2)
Total Protein: 7.7 g/dL (ref 6.5–8.1)

## 2021-06-28 MED ORDER — FENTANYL CITRATE (PF) 100 MCG/2ML IJ SOLN
100.0000 ug | Freq: Once | INTRAMUSCULAR | Status: AC
  Administered 2021-06-28: 100 ug via INTRAVENOUS
  Filled 2021-06-28: qty 2

## 2021-06-28 MED ORDER — ONDANSETRON 4 MG PO TBDP: ORAL_TABLET | ORAL | 0 refills | Status: DC

## 2021-06-28 MED ORDER — SODIUM CHLORIDE 0.9 % IV BOLUS
1000.0000 mL | Freq: Once | INTRAVENOUS | Status: AC
  Administered 2021-06-28: 1000 mL via INTRAVENOUS

## 2021-06-28 MED ORDER — ONDANSETRON HCL 4 MG/2ML IJ SOLN
4.0000 mg | Freq: Once | INTRAMUSCULAR | Status: AC
  Administered 2021-06-28: 4 mg via INTRAVENOUS
  Filled 2021-06-28: qty 2

## 2021-06-28 MED ORDER — IOHEXOL 350 MG/ML SOLN
100.0000 mL | Freq: Once | INTRAVENOUS | Status: AC | PRN
  Administered 2021-06-28: 100 mL via INTRAVENOUS

## 2021-06-28 NOTE — Discharge Instructions (Addendum)
Your CT scan was without acute finding.  Follow up with your family doc in the office.   Try pepcid or tagamet up to twice a day.  Try to avoid things that may make this worse, most commonly these are spicy foods tomato based products fatty foods chocolate and peppermint.  Alcohol and tobacco can also make this worse.  Return to the emergency department for sudden worsening pain fever or inability to eat or drink.

## 2021-06-28 NOTE — ED Provider Notes (Signed)
[facility] EMERGENCY DEPARTMENT Provider Note   CSN: [id number] Arrival date & time: 06/28/21  1005     History No chief complaint on file.   [name] is a 60 y.o. female.  HPI     60 year old female with a history of diabetes, hypertension, hyperlipidemia, alcoholic gastritis, coronary artery disease, hidradenitis, GERD who presents with concern for nausea, vomiting, acid reflux.  Reports Thursday began to have abdominal pain, nausea, vomiting. Has been vomiting everything she has tried to eat or drink for the past few days, 4-5 times per day.  No diarrhea. Has had some constipation.  No fevers or chills. Abdominal pain, diffuse, cramping. Right sided chest pain, burning. Pain radiating from abdomen to chest. Her aunt's funeral is today.   Past Medical History:  Diagnosis Date   Arthritis    Campylobacter enteritis May 2012   [facility] admission   Carpal tunnel syndrome, bilateral    Diabetes mellitus    Gastritis with bleeding due to alcohol 2010   [facility] admission   GERD (gastroesophageal reflux disease)    Hyperlipidemia    Hypertension    no meds   Menopause    Myocardial infarction [facility])    Neck pain    Wears glasses     Patient Active Problem List   Diagnosis Date Noted   Bilateral injections given May 21, 2020 degenerative arthritis of knee, bilateral 05/21/2020   Traumatic hematoma of female breast 11/29/2019   Dizziness 11/29/2019   Hospital discharge follow-up 10/23/2019   NSTEMI (non-ST elevated myocardial infarction) ([place]) 10/10/2019   CAD (coronary artery disease) 10/10/2019   Chest pain 10/09/2019   Elevated troponin 10/09/2019   Uncontrolled type 2 diabetes mellitus with hyperglycemia ([place]) 10/09/2019   Obesity (BMI 30.0-34.9) 10/09/2019   Cervical spondylitis with radiculitis ([place]) 08/29/2019   Acute bursitis of left shoulder 01/30/2019   Chronic midline posterior neck pain 06/28/2018   Urgency incontinence 06/28/2018    Degenerative joint disease of knee, left 04/26/2018   Carpal tunnel syndrome on both sides 03/29/2018   Anemia 01/02/2018   Class 1 obesity with serious comorbidity and body mass index (BMI) of 32.0 to 32.9 in adult 07/23/2017   Left hip pain 05/04/2017   Carpal tunnel syndrome on right 12/27/2016   Polyarthralgia 11/09/2016   Uncontrolled type 2 diabetes mellitus with retinopathy, with long-term current use of insulin ([place]) 07/07/2016   Visit for preventive health examination 06/21/2016   Increased urinary frequency 06/21/2016   Chronic meniscal tear of knee 08/19/2015   Left knee pain 06/19/2015   Candidiasis of skin 06/18/2015   Genital herpes 07/31/2014   Ulnar nerve compression 09/21/2013   Noncompliance with diet and medication regimen 11/15/2012   Boil, axilla 08/14/2012   Arthritis    Screening for breast cancer 08/04/2011   Screening for colon cancer 08/04/2011   HIDRADENITIS SUPPURATIVA 04/11/2007   GERD 11/17/2006   ENDOMETRIAL POLYP 11/17/2006   LOW BACK PAIN 11/17/2006   FIBROIDS, UTERUS 11/16/2006   Hyperlipidemia associated with type 2 diabetes mellitus ([place]) 11/16/2006   Essential hypertension 11/16/2006    Past Surgical History:  Procedure Laterality Date   CARPAL TUNNEL RELEASE Left 05/14/2014   Procedure: LEFT ULNAR NEUROPLASTY AT ELBOW AND ENDOSCOPIC CARPAL TUNNEL RELEASE;  Surgeon: Jolyn Nap, MD;  Location: [facility];  Service: Orthopedics;  Laterality: Left;   CORONARY STENT INTERVENTION N/A 10/09/2019   Procedure: CORONARY STENT INTERVENTION;  Surgeon: Adrian Prows, MD;  Location: [facility]  CV [facility];  Service: Cardiovascular;  Laterality: N/A;   CYST EXCISION  1995   under arms   endometrial biopsy  2004   benign   FOOT OSTEOTOMY  2010   right-spurs   LEFT HEART CATH AND CORONARY ANGIOGRAPHY N/A 10/09/2019   Procedure: LEFT HEART CATH AND CORONARY ANGIOGRAPHY;  Surgeon: Adrian Prows, MD;  Location: [facility];  Service:  Cardiovascular;  Laterality: N/A;   TONSILLECTOMY     ULNAR NERVE TRANSPOSITION Left 05/14/2014   Procedure: ULNAR NERVE DECOMPRESSION/TRANSPOSITION;  Surgeon: Jolyn Nap, MD;  Location: [place];  Service: Orthopedics;  Laterality: Left;     OB History   No obstetric history on file.     Family History  Problem Relation Age of Onset   Hypertension Mother    Diabetes Mother    Heart attack Mother    Stroke Father    Diabetes Father    Diabetes Maternal Grandmother    Hypertension Maternal Grandmother    Heart attack Sister     Social History   Tobacco Use   Smoking status: Never   Smokeless tobacco: Never  Vaping Use   Vaping Use: Never used  Substance Use Topics   Alcohol use: Not Currently   Drug use: Yes    Types: Marijuana    Comment: sometimes    Home Medications Prior to Admission medications   Medication Sig Start Date End Date Taking? Authorizing Provider  ondansetron (ZOFRAN ODT) 4 MG disintegrating tablet '4mg'$  ODT q4 hours prn nausea/vomit 06/28/21  Yes Deno Etienne, DO  amLODipine-valsartan (EXFORGE) 5-160 MG tablet Take 1 tablet by mouth every evening. 01/23/21   Adrian Prows, MD  aspirin 81 MG chewable tablet Chew 1 tablet (81 mg total) by mouth daily. 10/10/19   Black, Lezlie Octave, NP  atorvastatin (LIPITOR) 40 MG tablet Take 1 tablet (40 mg total) by mouth daily. 09/15/20   Philemon Kingdom, MD  buPROPion (WELLBUTRIN SR) 150 MG 12 hr tablet Take 1 tablet (150 mg total) by mouth 2 (two) times daily at 10 am and 4 pm. Start tablet daily for 3 days Patient not taking: Reported on 03/27/2021 01/23/21   Adrian Prows, MD  cetirizine (ZYRTEC ALLERGY) 10 MG tablet Take 1 tablet (10 mg total) by mouth daily. 01/27/20 07/22/20  Darr, Edison Nasuti, PA-C  clindamycin (CLEOCIN) 300 MG capsule Take 300 mg by mouth every 6 (six) hours. 09/27/20   [provider]  Continuous Blood Gluc Receiver (FREESTYLE LIBRE 2 READER) DEVI 1 each by Does not apply route  daily. 01/24/21   Philemon Kingdom, MD  Continuous Blood Gluc Sensor (FREESTYLE LIBRE 2 SENSOR) MISC 1 each by Does not apply route every 14 (fourteen) days. 01/24/21   Philemon Kingdom, MD  Dexlansoprazole 30 MG capsule Take 1 capsule (30 mg total) by mouth daily. 03/27/21   Adrian Prows, MD  ezetimibe (ZETIA) 10 MG tablet Take 1 tablet (10 mg total) by mouth daily after supper. 09/15/20 09/10/21  Philemon Kingdom, MD  gabapentin (NEURONTIN) 300 MG capsule Take 1 capsule (300 mg total) by mouth 3 (three) times daily. 08/29/19   Marcial Pacas, MD  glucose blood (BAYER CONTOUR TEST) test strip TEST BLOOD SUGARS 3 TIMES DAILY 08/12/16   Philemon Kingdom, MD  insulin NPH Human (NOVOLIN N RELION) 100 UNIT/ML injection Inject 0.2-0.55 mLs (20-55 Units total) into the skin 2 (two) times daily before a meal. 03/21/20   Philemon Kingdom, MD  insulin regular (NOVOLIN R RELION) 100 units/mL  injection Inject 0.1-0.25 mLs (10-25 Units total) into the skin 3 (three) times daily before meals. 09/04/20   Philemon Kingdom, MD  Lancet Devices (EASY TOUCH LANCING DEVICE) MISC Use to check diabetes 4 times daily  E 11.65 08/12/16   Philemon Kingdom, MD  latanoprost (XALATAN) 0.005 % ophthalmic solution Place 1 drop into both eyes at bedtime. 12/02/18   [provider]  metFORMIN (GLUCOPHAGE) 1000 MG tablet TAKE 1 TABLET BY MOUTH TWICE DAILY WITH A MEAL 05/28/21   Philemon Kingdom, MD  metoprolol succinate (TOPROL-XL) 50 MG 24 hr tablet TAKE 1 TABLET BY MOUTH ONCE DAILY. TAKE WITH OR IMMEDIATELY FOLLOWING A MEAL 04/10/20   Adrian Prows, MD  Multiple Vitamin (MULTIVITAMIN) tablet Take 1 tablet by mouth daily.    [provider]  naloxone [facility]) nasal spray 4 mg/0.1 mL Place 1 spray into the nose as needed. 12/09/20   [provider]  nystatin (NYSTATIN) powder Apply powder to affected area twice daily until rash has resolved 11/27/19   Crecencio Mc, MD  oxyCODONE-acetaminophen (PERCOCET) 7.5-325 MG  tablet Take 1 tablet by mouth 4 (four) times daily as needed. 10/07/19   [provider]  Semaglutide,0.25 or 0.'5MG'$ /DOS, (OZEMPIC, 0.25 OR 0.5 MG/DOSE,) 2 MG/1.5ML SOPN Inject 0.5 mg into the skin once a week. 03/11/21   Philemon Kingdom, MD  sulfamethoxazole-trimethoprim (BACTRIM DS) 800-160 MG tablet Take 1 tablet by mouth 2 (two) times daily. 01/23/20   [provider]  tiZANidine (ZANAFLEX) 4 MG tablet Take 4 mg by mouth 3 (three) times daily. 10/21/19   [provider]  vitamin B-12 (CYANOCOBALAMIN) 1000 MCG tablet Take 1,000 mcg by mouth daily.    [provider]    Allergies    Oxycodone-acetaminophen and Penicillins  Review of Systems   Review of Systems  Constitutional:  Negative for fever.  HENT:  Negative for sore throat.   Eyes:  Negative for visual disturbance.  Respiratory:  Negative for cough and shortness of breath.   Cardiovascular:  Positive for chest pain.  Gastrointestinal:  Positive for abdominal pain, constipation, nausea and vomiting. Negative for diarrhea.  Genitourinary:  Negative for difficulty urinating and dysuria.  Musculoskeletal:  Negative for back pain and neck pain.  Skin:  Negative for rash.  Neurological:  Negative for syncope and headaches.   Physical Exam Updated Vital Signs BP (!) 154/98 (BP Location: Right Arm)   Pulse 92   Temp 99.5 F (37.5 C) (Oral)   Resp 20   SpO2 100%   Physical Exam Vitals and nursing note reviewed.  Constitutional:      General: She is not in acute distress.    Appearance: She is well-developed. She is not diaphoretic.  HENT:     Head: Normocephalic and atraumatic.  Eyes:     Conjunctiva/sclera: Conjunctivae normal.  Cardiovascular:     Rate and Rhythm: Normal rate and regular rhythm.     Heart sounds: Normal heart sounds. No murmur heard.   No friction rub. No gallop.  Pulmonary:     Effort: Pulmonary effort is normal. No respiratory distress.     Breath sounds: Normal  breath sounds. No wheezing or rales.  Abdominal:     General: There is no distension.     Palpations: Abdomen is soft.     Tenderness: There is abdominal tenderness. There is no guarding.  Musculoskeletal:        General: No tenderness.     Cervical back: Normal range of motion.  Skin:    General: Skin is warm and dry.     Findings: No erythema or rash.  Neurological:     Mental Status: She is alert and oriented to person, place, and time.    ED Results / Procedures / Treatments   Labs (all labs ordered are listed, but only abnormal results are displayed) Labs Reviewed  CBC WITH DIFFERENTIAL/PLATELET - Abnormal; Notable for the following components:      Result Value   WBC 11.3 (*)    Platelets 421 (*)    Neutro Abs 8.7 (*)    All other components within normal limits  COMPREHENSIVE METABOLIC PANEL - Abnormal; Notable for the following components:   Glucose, Bld 169 (*)    All other components within normal limits  LIPASE, BLOOD - Abnormal; Notable for the following components:   Lipase 70 (*)    All other components within normal limits  URINALYSIS, ROUTINE W REFLEX MICROSCOPIC  TROPONIN I (HIGH SENSITIVITY)  TROPONIN I (HIGH SENSITIVITY)    EKG EKG Interpretation  Date/Time:  Saturday June 28 2021 10:19:13 EDT Ventricular Rate:  102 PR Interval:  116 QRS Duration: 82 QT Interval:  348 QTC Calculation: 453 R Axis:   4 Text Interpretation: Sinus tachycardia Otherwise normal ECG Nonspecific TW abnormality, TW inversion aVL new from prior Confirmed by Gareth Morgan [phone]) on 06/28/2021 12:49:22 PM  Radiology DG Chest 1 View  Result Date: 06/28/2021 CLINICAL DATA:  Nausea.  Vomiting.  Right-sided chest pain. EXAM: CHEST  1 VIEW COMPARISON:  10/08/2019 FINDINGS: Midline trachea. Normal heart size. Atherosclerosis in the transverse aorta. No pleural effusion or pneumothorax. Clear lungs. IMPRESSION: No acute cardiopulmonary disease. Aortic Atherosclerosis  (ICD10-I70.0). Electronically Signed   By: Abigail Miyamoto M.D.   On: 06/28/2021 11:06   CT ABDOMEN PELVIS W CONTRAST  Result Date: 06/28/2021 CLINICAL DATA:  Concern for bowel obstruction. EXAM: CT ABDOMEN AND PELVIS WITH CONTRAST TECHNIQUE: Multidetector CT imaging of the abdomen and pelvis was performed using the standard protocol following bolus administration of intravenous contrast. CONTRAST:  130m OMNIPAQUE IOHEXOL 350 MG/ML SOLN COMPARISON:  None. FINDINGS: Lower chest: No acute abnormality. Hepatobiliary: Tiny hypodensity in the left hepatic lobe (series 3, image 15), too small to fully characterize but possibly a small cyst. No additional focal lesions identified. Normal appearance of the gallbladder. No intra or extrahepatic biliary duct dilation. Pancreas: Unremarkable. No pancreatic ductal dilatation or surrounding inflammatory changes. Spleen: Normal in size without focal abnormality. Adrenals/Urinary Tract: Mild nodular enlargement of the left adrenal gland. Normal appearance of the right adrenal gland. Stomach/Bowel: Stomach is within normal limits. Appendix appears normal. No evidence of bowel wall thickening, distention, or inflammatory changes. Few colonic diverticula without evidence of diverticulitis. Vascular/Lymphatic: Mild aortic atherosclerosis. No enlarged abdominal or pelvic lymph nodes. Reproductive: Uterus and bilateral adnexa are unremarkable. Other: No abdominal wall hernia or abnormality. No abdominopelvic ascites. Musculoskeletal: No acute or significant osseous findings. IMPRESSION: 1. No acute intra-abdominal pathology. No evidence of bowel obstruction. 2. Few colonic diverticula without evidence of diverticulitis. 3. Mild nodular enlargement of the left adrenal gland, which may represent hyperplasia. 4. Aortic atherosclerosis. Aortic Atherosclerosis (ICD10-I70.0). Electronically Signed   By: NAudie PintoM.D.   On: 06/28/2021 16:04    Procedures Procedures    Medications Ordered in ED Medications  sodium chloride 0.9 % bolus 1,000 mL (0 mLs Intravenous Stopped 06/28/21 1915)  ondansetron (ZOFRAN) injection 4 mg (4 mg Intravenous Given 06/28/21 1621)  fentaNYL (SUBLIMAZE) injection 100 mcg (100  mcg Intravenous Given 06/28/21 1621)  iohexol (OMNIPAQUE) 350 MG/ML injection 100 mL (100 mLs Intravenous Contrast Given 06/28/21 1540)    ED Course  I have reviewed the triage vital signs and the nursing notes.  Pertinent labs & imaging results that were available during my care of the patient were reviewed by me and considered in my medical decision making (see chart for details).    MDM Rules/Calculators/A&P                            60 year old female with a history of diabetes, hypertension, hyperlipidemia, alcoholic gastritis, coronary artery disease, hidradenitis, GERD who presents with concern for nausea, vomiting, acid reflux  Labs show no anemia, no significant leukocytosis, no signs of diabetic ketoacidosis, hepatitis.  Lipase is mildly elevated, however not consistent with pancreatitis.  Troponin is negative x2 and doubt ACS.  Chest x-ray shows no acute abnormalities.  CT abdomen pelvis shows no evidence of SBO or other acute intraabdominal pathology. Possible gastritis/viral etiology. Given rx for zofran. Patient discharged in stable condition with understanding of reasons to return.    Final Clinical Impression(s) / ED Diagnoses Final diagnoses:  Nausea and vomiting in adult  Generalized abdominal pain    Rx / DC Orders ED Discharge Orders          Ordered    ondansetron (ZOFRAN ODT) 4 MG disintegrating tablet        06/28/21 1613             Gareth Morgan, MD 06/28/21 2337

## 2021-06-28 NOTE — ED Notes (Signed)
Patient transported to CT 

## 2021-06-28 NOTE — ED Notes (Signed)
All appropriate discharge materials reviewed at length with patient. Time for questions provided. Pt has no other questions at this time and verbalizes understanding of all provided materials.  

## 2021-06-28 NOTE — ED Provider Notes (Signed)
Emergency Medicine Provider Triage Evaluation Note  [name] , a 60 y.o. female  was evaluated in triage.  Patient has a past medical history of acid reflux.  She comes in with a ambulance with complaints of right-sided chest pain and burning.  She says that she has a headache as well and that she has arm pain on both of her sides.  Also complains of nausea and abdominal discomfort. She says all of the symptoms began 2 days ago.  Reports that she has 3 stents and an extensive cardiac pmh.   Review of Systems  Positive: CP, abd pain, bilat arm pain Negative: Dizziness, sob  Physical Exam  BP (!) 138/96 (BP Location: Left Arm)   Pulse (!) 105   Temp 99.5 F (37.5 C) (Oral)   Resp 18   SpO2 100%  Gen:   Awake, no distress   Resp:  Normal effort, lungs cta MSK:   Moves extremities without difficulty  Other:  Abd nontender.   Medical Decision Making  Medically screening exam initiated at 10:20 AM.  Appropriate orders placed.  JERALDIN BETTERIDGE was informed that the remainder of the evaluation will be completed by another provider, this initial triage assessment does not replace that evaluation, and the importance of remaining in the ED until their evaluation is complete.    Rhae Hammock, PA-C 06/28/21 1023    Lajean Saver, MD 06/28/21 [phone]

## 2021-06-28 NOTE — ED Triage Notes (Signed)
Pt to triage via [facility] from home.  Reports nausea, vomiting, acid reflux, R sided chest pain, headache, and bilateral arm pain x 2 days.  22g R FA.

## 2021-09-08 ENCOUNTER — Emergency Department (HOSPITAL_BASED_OUTPATIENT_CLINIC_OR_DEPARTMENT_OTHER): Payer: BC Managed Care – PPO

## 2021-09-08 ENCOUNTER — Emergency Department (HOSPITAL_BASED_OUTPATIENT_CLINIC_OR_DEPARTMENT_OTHER): Payer: BC Managed Care – PPO | Admitting: Radiology

## 2021-09-08 ENCOUNTER — Encounter (HOSPITAL_BASED_OUTPATIENT_CLINIC_OR_DEPARTMENT_OTHER): Payer: Self-pay | Admitting: Obstetrics and Gynecology

## 2021-09-08 ENCOUNTER — Emergency Department (HOSPITAL_BASED_OUTPATIENT_CLINIC_OR_DEPARTMENT_OTHER)
Admission: EM | Admit: 2021-09-08 | Discharge: 2021-09-09 | Disposition: A | Discharge status: Home / Self Care | Payer: BC Managed Care – PPO | Attending: Emergency Medicine | Admitting: Emergency Medicine

## 2021-09-08 ENCOUNTER — Other Ambulatory Visit: Payer: Self-pay

## 2021-09-08 DIAGNOSIS — M19112 Post-traumatic osteoarthritis, left shoulder: Secondary | ICD-10-CM | POA: Insufficient documentation

## 2021-09-08 DIAGNOSIS — Z7982 Long term (current) use of aspirin: Secondary | ICD-10-CM | POA: Insufficient documentation

## 2021-09-08 DIAGNOSIS — S59901A Unspecified injury of right elbow, initial encounter: Secondary | ICD-10-CM | POA: Diagnosis not present

## 2021-09-08 DIAGNOSIS — Z79899 Other long term (current) drug therapy: Secondary | ICD-10-CM | POA: Diagnosis not present

## 2021-09-08 DIAGNOSIS — M19022 Primary osteoarthritis, left elbow: Secondary | ICD-10-CM | POA: Insufficient documentation

## 2021-09-08 DIAGNOSIS — S59902A Unspecified injury of left elbow, initial encounter: Secondary | ICD-10-CM | POA: Diagnosis not present

## 2021-09-08 DIAGNOSIS — I2510 Atherosclerotic heart disease of native coronary artery without angina pectoris: Secondary | ICD-10-CM | POA: Diagnosis not present

## 2021-09-08 DIAGNOSIS — Z794 Long term (current) use of insulin: Secondary | ICD-10-CM | POA: Diagnosis not present

## 2021-09-08 DIAGNOSIS — S4991XA Unspecified injury of right shoulder and upper arm, initial encounter: Secondary | ICD-10-CM | POA: Insufficient documentation

## 2021-09-08 DIAGNOSIS — S4992XA Unspecified injury of left shoulder and upper arm, initial encounter: Secondary | ICD-10-CM | POA: Insufficient documentation

## 2021-09-08 DIAGNOSIS — M50322 Other cervical disc degeneration at C5-C6 level: Secondary | ICD-10-CM | POA: Insufficient documentation

## 2021-09-08 DIAGNOSIS — S6992XA Unspecified injury of left wrist, hand and finger(s), initial encounter: Secondary | ICD-10-CM | POA: Insufficient documentation

## 2021-09-08 DIAGNOSIS — M25532 Pain in left wrist: Secondary | ICD-10-CM

## 2021-09-08 DIAGNOSIS — S8991XA Unspecified injury of right lower leg, initial encounter: Secondary | ICD-10-CM | POA: Insufficient documentation

## 2021-09-08 DIAGNOSIS — S0990XA Unspecified injury of head, initial encounter: Secondary | ICD-10-CM | POA: Insufficient documentation

## 2021-09-08 DIAGNOSIS — R52 Pain, unspecified: Secondary | ICD-10-CM

## 2021-09-08 DIAGNOSIS — W109XXA Fall (on) (from) unspecified stairs and steps, initial encounter: Secondary | ICD-10-CM | POA: Insufficient documentation

## 2021-09-08 DIAGNOSIS — W19XXXA Unspecified fall, initial encounter: Secondary | ICD-10-CM

## 2021-09-08 DIAGNOSIS — I10 Essential (primary) hypertension: Secondary | ICD-10-CM | POA: Insufficient documentation

## 2021-09-08 DIAGNOSIS — S8992XA Unspecified injury of left lower leg, initial encounter: Secondary | ICD-10-CM | POA: Insufficient documentation

## 2021-09-08 DIAGNOSIS — E119 Type 2 diabetes mellitus without complications: Secondary | ICD-10-CM | POA: Diagnosis not present

## 2021-09-08 MED ORDER — IBUPROFEN 800 MG PO TABS
800.0000 mg | ORAL_TABLET | Freq: Once | ORAL | Status: AC
  Administered 2021-09-08: 800 mg via ORAL
  Filled 2021-09-08: qty 1

## 2021-09-08 NOTE — ED Provider Notes (Signed)
[facility] EMERGENCY DEPT Provider Note   CSN: [id number] Arrival date & time: 09/08/21  1557     History Chief Complaint  Patient presents with   [name] is a 60 y.o. female presents to the emergency department for evaluation of body aches after a fall down "a few steps" yesterday around 2000.  Describes the pain as aching.  Worsens with movement.  Patient reports she was going down the stairs carrying a plate of food when she missed a step fell little forward catching herself on the sidewall.  She denies any LOC, but mentions she did hit her head.  She complaining of bilateral shoulder pain left greater than right, bilateral knee pain left greater than right, and neck pain.  She reports there has been no change from her chronic back pain.  She denies any blurry vision, numbness, tingling, weakness, LOC, bleeding.   Fall Pertinent negatives include no chest pain, no abdominal pain, no headaches and no shortness of breath.      Past Medical History:  Diagnosis Date   Arthritis    Campylobacter enteritis May 2012   [facility] admission   Carpal tunnel syndrome, bilateral    Diabetes mellitus    Gastritis with bleeding due to alcohol 2010   [facility] admission   GERD (gastroesophageal reflux disease)    Hyperlipidemia    Hypertension    no meds   Menopause    Myocardial infarction [facility])    Neck pain    Wears glasses     Patient Active Problem List   Diagnosis Date Noted   Bilateral injections given May 21, 2020 degenerative arthritis of knee, bilateral 05/21/2020   Traumatic hematoma of female breast 11/29/2019   Dizziness 11/29/2019   Hospital discharge follow-up 10/23/2019   NSTEMI (non-ST elevated myocardial infarction) ([place]) 10/10/2019   CAD (coronary artery disease) 10/10/2019   Chest pain 10/09/2019   Elevated troponin 10/09/2019   Uncontrolled type 2 diabetes mellitus with hyperglycemia ([place]) 10/09/2019   Obesity (BMI 30.0-34.9) 10/09/2019    Cervical spondylitis with radiculitis ([place]) 08/29/2019   Acute bursitis of left shoulder 01/30/2019   Chronic midline posterior neck pain 06/28/2018   Urgency incontinence 06/28/2018   Degenerative joint disease of knee, left 04/26/2018   Carpal tunnel syndrome on both sides 03/29/2018   Anemia 01/02/2018   Class 1 obesity with serious comorbidity and body mass index (BMI) of 32.0 to 32.9 in adult 07/23/2017   Left hip pain 05/04/2017   Carpal tunnel syndrome on right 12/27/2016   Polyarthralgia 11/09/2016   Uncontrolled type 2 diabetes mellitus with retinopathy, with long-term current use of insulin 07/07/2016   Visit for preventive health examination 06/21/2016   Increased urinary frequency 06/21/2016   Chronic meniscal tear of knee 08/19/2015   Left knee pain 06/19/2015   Candidiasis of skin 06/18/2015   Genital herpes 07/31/2014   Ulnar nerve compression 09/21/2013   Noncompliance with diet and medication regimen 11/15/2012   Boil, axilla 08/14/2012   Arthritis    Screening for breast cancer 08/04/2011   Screening for colon cancer 08/04/2011   HIDRADENITIS SUPPURATIVA 04/11/2007   GERD 11/17/2006   ENDOMETRIAL POLYP 11/17/2006   LOW BACK PAIN 11/17/2006   FIBROIDS, UTERUS 11/16/2006   Hyperlipidemia associated with type 2 diabetes mellitus ([place]) 11/16/2006   Essential hypertension 11/16/2006    Past Surgical History:  Procedure Laterality Date   CARPAL TUNNEL RELEASE Left 05/14/2014   Procedure: LEFT ULNAR NEUROPLASTY AT ELBOW  AND ENDOSCOPIC CARPAL TUNNEL RELEASE;  Surgeon: Jolyn Nap, MD;  Location: [facility];  Service: Orthopedics;  Laterality: Left;   CORONARY STENT INTERVENTION N/A 10/09/2019   Procedure: CORONARY STENT INTERVENTION;  Surgeon: Adrian Prows, MD;  Location: [facility];  Service: Cardiovascular;  Laterality: N/A;   CYST EXCISION  1995   under arms   endometrial biopsy  2004   benign   FOOT OSTEOTOMY  2010   right-spurs    LEFT HEART CATH AND CORONARY ANGIOGRAPHY N/A 10/09/2019   Procedure: LEFT HEART CATH AND CORONARY ANGIOGRAPHY;  Surgeon: Adrian Prows, MD;  Location: [facility];  Service: Cardiovascular;  Laterality: N/A;   TONSILLECTOMY     ULNAR NERVE TRANSPOSITION Left 05/14/2014   Procedure: ULNAR NERVE DECOMPRESSION/TRANSPOSITION;  Surgeon: Jolyn Nap, MD;  Location: [facility];  Service: Orthopedics;  Laterality: Left;     OB History     Gravida      Para      Term      Preterm      AB      Living  2      SAB      IAB      Ectopic      Multiple      Live Births              Family History  Problem Relation Age of Onset   Hypertension Mother    Diabetes Mother    Heart attack Mother    Stroke Father    Diabetes Father    Diabetes Maternal Grandmother    Hypertension Maternal Grandmother    Heart attack Sister     Social History   Tobacco Use   Smoking status: Never   Smokeless tobacco: Never  Vaping Use   Vaping Use: Never used  Substance Use Topics   Alcohol use: Not Currently   Drug use: Yes    Types: Marijuana    Comment: sometimes    Home Medications Prior to Admission medications   Medication Sig Start Date End Date Taking? Authorizing Provider  amLODipine-valsartan (EXFORGE) 5-160 MG tablet Take 1 tablet by mouth every evening. 01/23/21   Adrian Prows, MD  aspirin 81 MG chewable tablet Chew 1 tablet (81 mg total) by mouth daily. 10/10/19   Black, Lezlie Octave, NP  atorvastatin (LIPITOR) 40 MG tablet Take 1 tablet (40 mg total) by mouth daily. 09/15/20   Philemon Kingdom, MD  buPROPion (WELLBUTRIN SR) 150 MG 12 hr tablet Take 1 tablet (150 mg total) by mouth 2 (two) times daily at 10 am and 4 pm. Start tablet daily for 3 days Patient not taking: Reported on 03/27/2021 01/23/21   Adrian Prows, MD  cetirizine (ZYRTEC ALLERGY) 10 MG tablet Take 1 tablet (10 mg total) by mouth daily. 01/27/20 07/22/20  Darr, Edison Nasuti, PA-C  clindamycin (CLEOCIN)  300 MG capsule Take 300 mg by mouth every 6 (six) hours. 09/27/20   [provider]  Continuous Blood Gluc Receiver (FREESTYLE LIBRE 2 READER) DEVI 1 each by Does not apply route daily. 01/24/21   Philemon Kingdom, MD  Continuous Blood Gluc Sensor (FREESTYLE LIBRE 2 SENSOR) MISC 1 each by Does not apply route every 14 (fourteen) days. 01/24/21   Philemon Kingdom, MD  Dexlansoprazole 30 MG capsule Take 1 capsule (30 mg total) by mouth daily. 03/27/21   Adrian Prows, MD  ezetimibe (ZETIA) 10 MG tablet Take 1 tablet (10 mg total)  by mouth daily after supper. 09/15/20 09/10/21  Philemon Kingdom, MD  gabapentin (NEURONTIN) 300 MG capsule Take 1 capsule (300 mg total) by mouth 3 (three) times daily. 08/29/19   Marcial Pacas, MD  glucose blood (BAYER CONTOUR TEST) test strip TEST BLOOD SUGARS 3 TIMES DAILY 08/12/16   Philemon Kingdom, MD  insulin NPH Human (NOVOLIN N RELION) 100 UNIT/ML injection Inject 0.2-0.55 mLs (20-55 Units total) into the skin 2 (two) times daily before a meal. 03/21/20   Philemon Kingdom, MD  insulin regular (NOVOLIN R RELION) 100 units/mL injection Inject 0.1-0.25 mLs (10-25 Units total) into the skin 3 (three) times daily before meals. 09/04/20   Philemon Kingdom, MD  Lancet Devices (EASY TOUCH LANCING DEVICE) MISC Use to check diabetes 4 times daily  E 11.65 08/12/16   Philemon Kingdom, MD  latanoprost (XALATAN) 0.005 % ophthalmic solution Place 1 drop into both eyes at bedtime. 12/02/18   [provider]  metFORMIN (GLUCOPHAGE) 1000 MG tablet TAKE 1 TABLET BY MOUTH TWICE DAILY WITH A MEAL 05/28/21   Philemon Kingdom, MD  metoprolol succinate (TOPROL-XL) 50 MG 24 hr tablet TAKE 1 TABLET BY MOUTH ONCE DAILY. TAKE WITH OR IMMEDIATELY FOLLOWING A MEAL 04/10/20   Adrian Prows, MD  Multiple Vitamin (MULTIVITAMIN) tablet Take 1 tablet by mouth daily.    [provider]  naloxone [facility]) nasal spray 4 mg/0.1 mL Place 1 spray into the nose as needed. 12/09/20   [provider]  nystatin (NYSTATIN) powder Apply powder to affected area twice daily until rash has resolved 11/27/19   Crecencio Mc, MD  ondansetron (ZOFRAN ODT) 4 MG disintegrating tablet 4mg  ODT q4 hours prn nausea/vomit 06/28/21   Deno Etienne, DO  oxyCODONE-acetaminophen (PERCOCET) 7.5-325 MG tablet Take 1 tablet by mouth 4 (four) times daily as needed. 10/07/19   [provider]  Semaglutide,0.25 or 0.5MG /DOS, (OZEMPIC, 0.25 OR 0.5 MG/DOSE,) 2 MG/1.5ML SOPN Inject 0.5 mg into the skin once a week. 03/11/21   Philemon Kingdom, MD  sulfamethoxazole-trimethoprim (BACTRIM DS) 800-160 MG tablet Take 1 tablet by mouth 2 (two) times daily. 01/23/20   [provider]  tiZANidine (ZANAFLEX) 4 MG tablet Take 4 mg by mouth 3 (three) times daily. 10/21/19   [provider]  vitamin B-12 (CYANOCOBALAMIN) 1000 MCG tablet Take 1,000 mcg by mouth daily.    [provider]    Allergies    Oxycodone-acetaminophen and Penicillins  Review of Systems   Review of Systems  Constitutional:  Negative for chills and fever.  HENT:  Negative for ear pain and sore throat.   Eyes:  Negative for pain and visual disturbance.  Respiratory:  Negative for cough and shortness of breath.   Cardiovascular:  Negative for chest pain and palpitations.  Gastrointestinal:  Negative for abdominal pain and vomiting.  Genitourinary:  Negative for dysuria and hematuria.  Musculoskeletal:  Positive for arthralgias, back pain and neck pain. Negative for gait problem and neck stiffness.  Skin:  Negative for color change and rash.  Neurological:  Negative for dizziness, seizures, syncope, weakness, numbness and headaches.  All other systems reviewed and are negative.  Physical Exam Updated Vital Signs BP (!) 141/78 (BP Location: Right Arm)   Pulse 74   Temp 98.5 F (36.9 C) (Oral)   Resp 17   Ht 5\' 4"  (1.626 m)   Wt 87.1 kg   SpO2 100%   BMI 32.96 kg/m   Physical Exam Vitals and nursing  note reviewed.  Constitutional:  General: She is not in acute distress.    Appearance: Normal appearance. She is not toxic-appearing.  HENT:     Head: Normocephalic and atraumatic.     Mouth/Throat:     Mouth: Mucous membranes are moist.     Pharynx: Oropharynx is clear.  Eyes:     General: No scleral icterus. Neck:     Comments: No step-offs or deformities noted.  No overlying skin changes, erythema, ecchymosis, or rashes noted to the area.  No midline or paraspinal tenderness to C-spine.  Full range of motion of neck. Cardiovascular:     Rate and Rhythm: Normal rate and regular rhythm.  Pulmonary:     Effort: Pulmonary effort is normal. No respiratory distress.     Breath sounds: Normal breath sounds. No wheezing.  Abdominal:     General: Abdomen is flat. Bowel sounds are normal.     Palpations: Abdomen is soft.     Tenderness: There is no abdominal tenderness. There is no guarding or rebound.  Musculoskeletal:        General: Swelling, tenderness and signs of injury present. No deformity.     Cervical back: Normal range of motion. No tenderness.     Comments: No midline or paraspinal back tenderness palpation of the cervical, thoracic, lumbar, or sacral spine.  No anterior chest pain wall tenderness palpation.  Patient complains of mild tenderness palpation of bilateral shoulders and elbows.  Compartments soft.  No obvious deformities.  No overlying skin changes, ecchymosis, erythema, or rashes noted to the area.  Patient has full range of motion of elbows and shoulders.  Intact with no give out weakness  Patient has mild swelling to the dorsum of the left wrist.  Otherwise no overlying skin changes, erythema, ecchymosis, or rashes noted to the area.  Compartments soft.  Radial pulses intact.  Sensation intact.  No obvious deformities or step-offs.  Patient has full range of motion with pain.  Strength in tact with no give out weakness  Patient has mild tenderness palpation of  bilateral knees.  Compartments soft.  No obvious deformities or step-offs noted.  DP pulses intact.  Skin:    General: Skin is warm and dry.     Findings: No bruising or erythema.     Comments: No abrasions noted to the areas.  Neurological:     General: No focal deficit present.     Mental Status: She is alert. Mental status is at baseline.     Sensory: No sensory deficit.     Motor: No weakness.     Gait: Gait normal.     Comments: No sensory deficit.  Patient has good prescription strength without giveaway weakness.  Ambulates around room with ease.    ED Results / Procedures / Treatments   Labs (all labs ordered are listed, but only abnormal results are displayed) Labs Reviewed - No data to display  EKG None  Radiology DG Shoulder Right  Result Date: 09/08/2021 CLINICAL DATA:  Fall, pain EXAM: RIGHT SHOULDER - 2+ VIEW COMPARISON:  08/20/2019 FINDINGS: Degenerative changes in the right [facility] joint with joint space narrowing and spurring. Early spurring in the inferior glenohumeral joint. No acute bony abnormality. Specifically, no fracture, subluxation, or dislocation. IMPRESSION: No acute bony abnormality. Electronically Signed   By: Rolm Baptise M.D.   On: 09/08/2021 18:29   DG Elbow Complete Left  Result Date: 09/08/2021 CLINICAL DATA:  Left elbow pain EXAM: LEFT ELBOW - COMPLETE 3+ VIEW COMPARISON:  None.  FINDINGS: Normal alignment. No fracture or dislocation. Mild ulnohumeral degenerative arthritis with minimal osteophyte formation and asymmetric joint space narrowing. Radiocapitellar joint space appears preserved. No effusion. Soft tissues are unremarkable. IMPRESSION: Mild degenerative change.  No fracture or dislocation. Electronically Signed   By: Fidela Salisbury M.D.   On: 09/08/2021 19:28   DG Elbow Complete Right  Result Date: 09/08/2021 CLINICAL DATA:  Fall, right elbow pain. EXAM: RIGHT ELBOW - COMPLETE 3+ VIEW COMPARISON:  None. FINDINGS: There is no evidence of  fracture, dislocation, or joint effusion. There is no evidence of arthropathy or other focal bone abnormality. Soft tissues are unremarkable. IMPRESSION: Negative. Electronically Signed   By: Fidela Salisbury M.D.   On: 09/08/2021 19:29   DG Wrist Complete Left  Result Date: 09/08/2021 CLINICAL DATA:  Status post fall. EXAM: LEFT WRIST - COMPLETE 3+ VIEW COMPARISON:  X-ray left wrist 12/18/2016 the FINDINGS: Lunotriquetral coalition. 2 mm ossific density anterior to the scaphoid on lateral view and lateral to the scaphoid on oblique view. Question mild cortical irregularity of the scaphoid on the frontal view. There is no definite evidence of acute displaced fracture or dislocation. There is no evidence of severe arthropathy or other focal bone abnormality. Associated subcutaneus soft tissue edema. IMPRESSION: 1. A 2 mm ossific density anterior to the scaphoid on lateral view and lateral to the scaphoid on oblique view of unclear etiology. Associated overlying subcutaneus soft tissue edema. Recommend correlation with point tenderness to palpation for an acute avulsion fracture. 2. Lunotriquetral coalition. Electronically Signed   By: Iven Finn M.D.   On: 09/08/2021 17:05   DG Knee 2 Views Left  Result Date: 09/08/2021 CLINICAL DATA:  Fall.  Knee pain. EXAM: LEFT KNEE - 1-2 VIEW COMPARISON:  X-ray bilateral knees 05/21/2020, x-ray left knee 06/18/2015 FINDINGS: No evidence of fracture, dislocation, or large joint effusion. No evidence of arthropathy or other focal bone abnormality. Soft tissues are unremarkable. Vascular calcifications. IMPRESSION: Negative. Electronically Signed   By: Iven Finn M.D.   On: 09/08/2021 17:07   DG Knee 2 Views Right  Result Date: 09/08/2021 CLINICAL DATA:  Fall, knee pain EXAM: RIGHT KNEE - 1-2 VIEW COMPARISON:  Bilateral knee radiographs 05/21/2020 FINDINGS: There is no acute fracture or dislocation. Knee alignment is normal. The joint spaces are preserved. The  soft tissues are unremarkable. There is no effusion. IMPRESSION: No acute fracture or dislocation. Electronically Signed   By: Valetta Mole M.D.   On: 09/08/2021 16:58   CT Head Wo Contrast  Result Date: 09/08/2021 CLINICAL DATA:  Status post fall EXAM: CT HEAD WITHOUT CONTRAST CT CERVICAL SPINE WITHOUT CONTRAST TECHNIQUE: Multidetector CT imaging of the head and cervical spine was performed following the standard protocol without intravenous contrast. Multiplanar CT image reconstructions of the cervical spine were also generated. COMPARISON:  None. FINDINGS: CT HEAD FINDINGS Brain: No evidence of large-territorial acute infarction. No parenchymal hemorrhage. No mass lesion. No extra-axial collection. No mass effect or midline shift. No hydrocephalus. Basilar cisterns are patent. Vascular: No hyperdense vessel. Atherosclerotic calcifications are present within the cavernous internal carotid arteries. Skull: No acute fracture or focal lesion. Sinuses/Orbits: Paranasal sinuses and mastoid air cells are clear. The orbits are unremarkable. Other: None. CT CERVICAL SPINE FINDINGS Alignment: Slight reversal of normal cervical lordosis centered at the C4-C5 level likely due to positioning and degenerative changes. Skull base and vertebrae: Multilevel mild degenerative changes of the spine. Up to moderate degenerative changes of the spine at the C5-C6 level. No  severe osseous neural foraminal or central canal stenosis. No acute fracture. No aggressive appearing focal osseous lesion or focal pathologic process. Soft tissues and spinal canal: No prevertebral fluid or swelling. No visible canal hematoma. Upper chest: Biapical pleural/pulmonary scarring. Other: None. IMPRESSION: 1. No acute intracranial abnormality. 2. No acute displaced fracture or traumatic listhesis of the cervical spine. Electronically Signed   By: Iven Finn M.D.   On: 09/08/2021 16:58   CT Cervical Spine Wo Contrast  Result Date:  09/08/2021 CLINICAL DATA:  Status post fall EXAM: CT HEAD WITHOUT CONTRAST CT CERVICAL SPINE WITHOUT CONTRAST TECHNIQUE: Multidetector CT imaging of the head and cervical spine was performed following the standard protocol without intravenous contrast. Multiplanar CT image reconstructions of the cervical spine were also generated. COMPARISON:  None. FINDINGS: CT HEAD FINDINGS Brain: No evidence of large-territorial acute infarction. No parenchymal hemorrhage. No mass lesion. No extra-axial collection. No mass effect or midline shift. No hydrocephalus. Basilar cisterns are patent. Vascular: No hyperdense vessel. Atherosclerotic calcifications are present within the cavernous internal carotid arteries. Skull: No acute fracture or focal lesion. Sinuses/Orbits: Paranasal sinuses and mastoid air cells are clear. The orbits are unremarkable. Other: None. CT CERVICAL SPINE FINDINGS Alignment: Slight reversal of normal cervical lordosis centered at the C4-C5 level likely due to positioning and degenerative changes. Skull base and vertebrae: Multilevel mild degenerative changes of the spine. Up to moderate degenerative changes of the spine at the C5-C6 level. No severe osseous neural foraminal or central canal stenosis. No acute fracture. No aggressive appearing focal osseous lesion or focal pathologic process. Soft tissues and spinal canal: No prevertebral fluid or swelling. No visible canal hematoma. Upper chest: Biapical pleural/pulmonary scarring. Other: None. IMPRESSION: 1. No acute intracranial abnormality. 2. No acute displaced fracture or traumatic listhesis of the cervical spine. Electronically Signed   By: Iven Finn M.D.   On: 09/08/2021 16:58   DG Shoulder Left  Result Date: 09/08/2021 CLINICAL DATA:  Fall, pain EXAM: LEFT SHOULDER - 2+ VIEW COMPARISON:  None. FINDINGS: Degenerative changes in the [facility] joint with joint space narrowing and spurring. Glenohumeral joint is maintained. No acute bony  abnormality. Specifically, no fracture, subluxation, or dislocation. Soft tissues are intact. IMPRESSION: Degenerative changes in the left AC joint. No acute bony abnormality. Electronically Signed   By: Rolm Baptise M.D.   On: 09/08/2021 18:29    Procedures Procedures   Medications Ordered in ED Medications  ibuprofen (ADVIL) tablet 800 mg (800 mg Oral Given 09/08/21 1935)    ED Course  I have reviewed the triage vital signs and the nursing notes.  Pertinent labs & imaging results that were available during my care of the patient were reviewed by me and considered in my medical decision making (see chart for details).  Patient's physical exam positive for diffuse MSK tenderness to palpation.  No deformities or step-offs noted to the patient's bilateral upper and lower extremities, back, abdomen, or neck.  Pulses intact bilaterally in upper and lower extremities.  She is most tender over her left wrist diffusely, mostly at the base of the right thumb and snuffbox.  Patient able to pronate and supinate.  Patient ambulates around room with ease.  The patient's imaging showed degenerative changes to the left shoulder and right shoulder.  CT head and C-spine shows no acute intracranial abnormality.  No acute displaced fracture or traumatic listhesis of the cervical spine.  Right and left knee x-rays negative.  Right and left elbow x-ray negative.  Patient  complaining of left wrist pain, tender to palpation.  Left wrist shows a 2 mm ossific density anterior to the scaphoid on lateral view and lateral to the scaphoid on oblique view of unclear etiology. Associated overlying subcutaneus soft tissue edema.  Radial pulses intact.  Sensation intact.  We will place patient in thumb spica brace with follow-up with hand surgery to rule out or follow-up on possible scaphoid fracture.  I discussed with the patient the imaging findings.  Discussed the importance of following up with hand surgery for reevaluation  of possible avulsion fracture.  Discussed the importance of wearing her brace and not taking it off.  Work note given for a few days off for her to rest.  Recommended Tylenol/ibuprofen for pain control as needed.  Recommended ice/heat to the area for pain.  Return precautions given.  Patient agrees to plan.  Patient is stable being discharged home in good condition.   MDM Rules/Calculators/A&P                          Final Clinical Impression(s) / ED Diagnoses Final diagnoses:  Fall, initial encounter  Body aches  Left wrist pain    Rx / DC Orders ED Discharge Orders     None        Sherrell Puller, PA-C 09/08/21 [place], [employer] [facility], DO 09/08/21 2308

## 2021-09-08 NOTE — Discharge Instructions (Signed)
You are seen here for evaluation after a fall yesterday.  You have a possible fracture of your left wrist which is why you were given a splint.  Please leave splint on until evaluated by a hand surgeon.  Dr. Tempie Donning is a hand surgeon that you are being referred to.  Please call tomorrow to set up a follow-up appointment.  You can take Tylenol/ibuprofen for pain as needed.  Work note is included for days off to rest.  Apply ice or heat to areas causing pain.  If you have concern, new or worsening symptoms, please return to the nearest emergency department.

## 2021-09-08 NOTE — ED Triage Notes (Signed)
Patient reports she was at a party yesterday and missed a step going down. Patient reports she put a dent in the other peoples wall. Patient reports her son helped her up. Patient reports she hit her head but does not remember. Patient reports pain in her neck and pain in her left hand. Patient also reports pain in the right knee.

## 2021-09-16 ENCOUNTER — Ambulatory Visit: Payer: BC Managed Care – PPO | Admitting: Orthopedic Surgery

## 2021-09-26 ENCOUNTER — Ambulatory Visit: Payer: BC Managed Care – PPO | Admitting: Cardiology

## 2021-09-26 ENCOUNTER — Encounter: Payer: Self-pay | Admitting: Cardiology

## 2021-09-26 ENCOUNTER — Other Ambulatory Visit: Payer: Self-pay

## 2021-09-26 VITALS — BP 126/60 | HR 75 | Temp 98.5°F | Resp 17 | Ht 64.0 in | Wt 183.6 lb

## 2021-09-26 DIAGNOSIS — I25118 Atherosclerotic heart disease of native coronary artery with other forms of angina pectoris: Secondary | ICD-10-CM

## 2021-09-26 DIAGNOSIS — I10 Essential (primary) hypertension: Secondary | ICD-10-CM

## 2021-09-26 DIAGNOSIS — E782 Mixed hyperlipidemia: Secondary | ICD-10-CM

## 2021-09-26 NOTE — Progress Notes (Signed)
Primary Physician/Referring:  Carylon Perches, NP  Patient ID: [name], female    DOB: 19-Aug-1961, 60 y.o.   MRN: [id number]  Chief Complaint  Patient presents with   Coronary Artery Disease   Hypertension   Hyperlipidemia   Follow-up    6 months   HPI:    [name]  is a 59 y.o. AAF  with diabetes mellitus, hyperlipidemia, prior cigarette smoking quit in 2013, smokes marijuana occasionally, CAD  Presenting with NSTEMI, S/P stenting to mid right and also to the distal LAD by implantation of DES on 10/09/2019.  This is a 71-month office visit, states that she is doing well.  Denies any chest pain or shortness of breath.  She is trying her best to lose weight.  Past Medical History:  Diagnosis Date   Arthritis    Campylobacter enteritis May 2012   [facility] admission   Carpal tunnel syndrome, bilateral    Diabetes mellitus    Gastritis with bleeding due to alcohol 2010   [facility] admission   GERD (gastroesophageal reflux disease)    Hyperlipidemia    Hypertension    no meds   Menopause    Myocardial infarction [facility])    Neck pain    Wears glasses    Past Surgical History:  Procedure Laterality Date   CARPAL TUNNEL RELEASE Left 05/14/2014   Procedure: LEFT ULNAR NEUROPLASTY AT ELBOW AND ENDOSCOPIC CARPAL TUNNEL RELEASE;  Surgeon: Jolyn Nap, MD;  Location: [place];  Service: Orthopedics;  Laterality: Left;   CORONARY STENT INTERVENTION N/A 10/09/2019   Procedure: CORONARY STENT INTERVENTION;  Surgeon: Adrian Prows, MD;  Location: [place] [facility];  Service: Cardiovascular;  Laterality: N/A;   CYST EXCISION  1995   under arms   endometrial biopsy  2004   benign   FOOT OSTEOTOMY  2010   right-spurs   LEFT HEART CATH AND CORONARY ANGIOGRAPHY N/A 10/09/2019   Procedure: LEFT HEART CATH AND CORONARY ANGIOGRAPHY;  Surgeon: Adrian Prows, MD;  Location: [place] [facility];  Service: Cardiovascular;  Laterality: N/A;   TONSILLECTOMY     ULNAR NERVE  TRANSPOSITION Left 05/14/2014   Procedure: ULNAR NERVE DECOMPRESSION/TRANSPOSITION;  Surgeon: Jolyn Nap, MD;  Location: [place];  Service: Orthopedics;  Laterality: Left;   Social History   Tobacco Use   Smoking status: Never   Smokeless tobacco: Never  Substance Use Topics   Alcohol use: Not Currently   Marital Status: Single   ROS  Review of Systems  Cardiovascular:  Negative for chest pain, dyspnea on exertion and leg swelling.  Gastrointestinal:  Negative for melena.  Objective   Vitals with BMI 09/26/2021 09/08/2021 09/08/2021  Height 5\' 4"  - 5\' 4"   Weight 183 lbs 10 oz - 192 lbs  BMI 19.5 - 09.32  Systolic 671 245 -  Diastolic 60 78 -  Pulse 75 74 -    Physical Exam Constitutional:      Comments: She is moderately built and mildly obese in no acute distress.  Cardiovascular:     Rate and Rhythm: Normal rate and regular rhythm.     Pulses: Intact distal pulses.          Carotid pulses are 2+ on the right side and 2+ on the left side.      Femoral pulses are 2+ on the right side and 2+ on the left side.      Dorsalis pedis pulses are 2+ on the  right side and 2+ on the left side.       Posterior tibial pulses are 2+ on the right side and 2+ on the left side.     Heart sounds: Normal heart sounds. No murmur heard.   No gallop.     Comments: No leg edema, no JVD. Pulmonary:     Effort: Pulmonary effort is normal.     Breath sounds: Normal breath sounds.  Abdominal:     General: Bowel sounds are normal.     Palpations: Abdomen is soft.   Laboratory examination:   Recent Labs    01/24/21 1606 06/28/21 1033  NA 142 136  K 4.3 3.7  CL 105 98  CO2 27 27  GLUCOSE 73 169*  BUN 18 14  CREATININE 0.65 0.79  CALCIUM 10.1 9.8  GFRNONAA 96 >60  GFRAA 111  --    CrCl cannot be calculated (Patient's most recent lab result is older than the maximum 21 days allowed.).  CMP Latest Ref Rng & Units 06/28/2021 01/24/2021 12/08/2019  Glucose 70 - 99  mg/dL 169(H) 73 198(H)  BUN 8 - 23 mg/dL 14 18 15   Creatinine 0.44 - 1.00 mg/dL 0.79 0.65 0.74  Sodium 135 - 145 mmol/L 136 142 141  Potassium 3.5 - 5.1 mmol/L 3.7 4.3 4.4  Chloride 98 - 111 mmol/L 98 105 104  CO2 22 - 32 mmol/L 27 27 24   Calcium 8.9 - 10.3 mg/dL 9.8 10.1 9.8  Total Protein 6.5 - 8.1 g/dL 7.7 7.3 7.2  Total Bilirubin 0.3 - 1.2 mg/dL 1.1 0.4 0.3  Alkaline Phos 38 - 126 U/L 92 - 125(H)  AST 15 - 41 U/L 21 18 15   ALT 0 - 44 U/L 26 19 22    CBC Latest Ref Rng & Units 06/28/2021 12/08/2019 10/14/2019  WBC 4.0 - 10.5 K/uL 11.3(H) 9.8 9.7  Hemoglobin 12.0 - 15.0 g/dL 13.9 12.6 11.2(L)  Hematocrit 36.0 - 46.0 % 43.3 38.3 34.8(L)  Platelets 150 - 400 K/uL 421(H) 379 306   Lipid Panel Recent Labs    01/24/21 1606  CHOL 124  TRIG 99  LDLCALC 55  HDL 51  CHOLHDL 2.4     HEMOGLOBIN A1C Lab Results  Component Value Date   HGBA1C 7.3 (A) 01/24/2021   TSH No results for input(s): TSH in the last 8760 hours. Medications and allergies   Allergies  Allergen Reactions   Oxycodone-Acetaminophen Other (See Comments)    Causes her to feel "hot".   Penicillins Rash    Did it involve swelling of the face/tongue/throat, SOB, or low BP? No Did it involve sudden or severe rash/hives, skin peeling, or any reaction on the inside of your mouth or nose? No Did you need to seek medical attention at a hospital or doctor's office? No When did it last happen?       If all above answers are "NO", may proceed with cephalosporin use.    Outpatient Medications Prior to Visit  Medication Sig Dispense Refill   amLODipine-valsartan (EXFORGE) 5-160 MG tablet Take 1 tablet by mouth every evening. 30 tablet 3   aspirin 81 MG chewable tablet Chew 1 tablet (81 mg total) by mouth daily.     atorvastatin (LIPITOR) 40 MG tablet Take 1 tablet (40 mg total) by mouth daily. 90 tablet 3   cetirizine (ZYRTEC ALLERGY) 10 MG tablet Take 1 tablet (10 mg total) by mouth daily. 30 tablet 0   clindamycin  (CLEOCIN) 300 MG capsule Take 300  mg by mouth every 6 (six) hours.     Continuous Blood Gluc Receiver (FREESTYLE LIBRE 2 READER) DEVI 1 each by Does not apply route daily. 1 each 0   Continuous Blood Gluc Sensor (FREESTYLE LIBRE 2 SENSOR) MISC 1 each by Does not apply route every 14 (fourteen) days. 6 each 3   ezetimibe (ZETIA) 10 MG tablet Take 1 tablet (10 mg total) by mouth daily after supper. 90 tablet 3   gabapentin (NEURONTIN) 600 MG tablet Take 600 mg by mouth 3 (three) times daily.     glucose blood (BAYER CONTOUR TEST) test strip TEST BLOOD SUGARS 3 TIMES DAILY 200 each 11   insulin NPH Human (NOVOLIN N RELION) 100 UNIT/ML injection Inject 0.2-0.55 mLs (20-55 Units total) into the skin 2 (two) times daily before a meal. 99 mL 6   insulin regular (NOVOLIN R RELION) 100 units/mL injection Inject 0.1-0.25 mLs (10-25 Units total) into the skin 3 (three) times daily before meals. 67.5 mL 1   Lancet Devices (EASY TOUCH LANCING DEVICE) MISC Use to check diabetes 4 times daily  E 11.65 1 each 11   latanoprost (XALATAN) 0.005 % ophthalmic solution Place 1 drop into both eyes at bedtime.     metFORMIN (GLUCOPHAGE) 1000 MG tablet TAKE 1 TABLET BY MOUTH TWICE DAILY WITH A MEAL 180 tablet 0   metoprolol succinate (TOPROL-XL) 50 MG 24 hr tablet TAKE 1 TABLET BY MOUTH ONCE DAILY. TAKE WITH OR IMMEDIATELY FOLLOWING A MEAL 30 tablet 3   Multiple Vitamin (MULTIVITAMIN) tablet Take 1 tablet by mouth daily.     naloxone (NARCAN) nasal spray 4 mg/0.1 mL Place 1 spray into the nose as needed.     nystatin (NYSTATIN) powder Apply powder to affected area twice daily until rash has resolved 15 g 3   ondansetron (ZOFRAN ODT) 4 MG disintegrating tablet 4mg  ODT q4 hours prn nausea/vomit 20 tablet 0   oxyCODONE-acetaminophen (PERCOCET) 7.5-325 MG tablet Take 1 tablet by mouth 4 (four) times daily as needed.     pantoprazole (PROTONIX) 40 MG tablet Take 40 mg by mouth daily.     Semaglutide,0.25 or 0.5MG /DOS, (OZEMPIC,  0.25 OR 0.5 MG/DOSE,) 2 MG/1.5ML SOPN Inject 0.5 mg into the skin once a week. 6 mL 5   tiZANidine (ZANAFLEX) 4 MG tablet Take 4 mg by mouth 3 (three) times daily.     vitamin B-12 (CYANOCOBALAMIN) 1000 MCG tablet Take 1,000 mcg by mouth daily.     Dexlansoprazole 30 MG capsule Take 1 capsule (30 mg total) by mouth daily. 30 capsule 2   gabapentin (NEURONTIN) 300 MG capsule Take 1 capsule (300 mg total) by mouth 3 (three) times daily. 90 capsule 3   buPROPion (WELLBUTRIN SR) 150 MG 12 hr tablet Take 1 tablet (150 mg total) by mouth 2 (two) times daily at 10 am and 4 pm. Start tablet daily for 3 days (Patient not taking: Reported on 09/26/2021) 60 tablet 2   sulfamethoxazole-trimethoprim (BACTRIM DS) 800-160 MG tablet Take 1 tablet by mouth 2 (two) times daily.     No facility-administered medications prior to visit.   No orders of the defined types were placed in this encounter.  Medications Discontinued During This Encounter  Medication Reason   sulfamethoxazole-trimethoprim (BACTRIM DS) 800-160 MG tablet Error   gabapentin (NEURONTIN) 546 MG capsule Duplicate   Dexlansoprazole 30 MG capsule Duplicate     Radiology:   CXR 10/08/2019:  The heart size and mediastinal contours are within normal limits. Both  lungs are clear. The visualized skeletal structures are Unremarkable.  Cardiac Studies:    Echocardiogram 10/09/2019:   1. Left ventricular ejection fraction, by visual estimation, is 50 to 55%. The left ventricle has low normal function. There is mildly increased left ventricular hypertrophy. Basal to mid inferolateral hypokinesis  2. The average left ventricular global longitudinal strain is -14.9 %.  3. Global right ventricle has normal systolic function.The right ventricular size is normal. No increase in right ventricular wall thickness.   Left heart catheterization 10/09/2019: Hyperdynamic LV, EF 65 to 70%, EDP normal. RCA is a dominant vessel.  Ulcerated tandem 80% followed  by a 99% stenosis.  SP 2.5 x 38 and a 2.5 x 8 mm resolute Onyx DES stents implanted, second stent used for distal edge dissection.  Stenosis reduced to 0% with TIMI-3 to TIMI-3 flow. Circumflex coronary artery is smooth with minimal disease.  Gives origin to large OM1 and OM 2.  Tortuous. LAD is diffusely diseased from the mid to distal segment.  There is a hazy 70 to 80% stenosis in the mid LAD.  Following which distal LAD had a 40% diffuse disease.  Mild calcification is evident.  SP 2.24 x 15 mm resolute Onyx DES postdilated with 2.25 x 8 mm [place] at 22 atmospheric pressure with 0% residual stenosis.  TIMI-3 to TIMI-3 flow.  Carotid artery duplex  08/13/2020: Minimal stenosis in the left internal carotid artery (1-15%). Stenosis in the left common carotid artery (<50%). Antegrade right vertebral artery flow. Antegrade left vertebral artery flow. Follow up studies if clinically indicated.  EKG:  EKG 09/26/2021: Normal sinus rhythm at rate of 83 bpm, normal axis, no evidence of ischemia, normal EKG. no significant change from 01/23/2021.   Assessment     ICD-10-CM   1. Essential hypertension  I10 EKG 12-Lead    2. Coronary artery disease involving native coronary artery of native heart with other form of angina pectoris ([place])  I25.118     3. Mixed hyperlipidemia  E78.2       No orders of the defined types were placed in this encounter.  Medications Discontinued During This Encounter  Medication Reason   sulfamethoxazole-trimethoprim (BACTRIM DS) 800-160 MG tablet Error   gabapentin (NEURONTIN) 671 MG capsule Duplicate   Dexlansoprazole 30 MG capsule Duplicate   Orders Placed This Encounter  Procedures   EKG 12-Lead      Recommendations:   No orders of the defined types were placed in this encounter.   [name]  is a 60 y.o. AAF  with diabetes mellitus, hyperlipidemia, prior cigarette smoking quit in 2013, smokes marijuana occasionally, CAD  Presenting with NSTEMI,  S/P stenting to mid right and also to the distal LAD by implantation of DES on 10/09/2019.   This is a 69-month office visit, she is presently doing well and has not had any recurrence of angina pectoris.  Her diabetes is also well controlled now and she is trying to make lifestyle changes and has been losing weight.  Lipids are under good control as well.  Blood pressure is also well controlled.  No changes were done by me today, I will see her back in 1 year for follow-up.       Adrian Prows, MD, [facility] 09/26/2021, 11:36 AM Office: [phone]

## 2021-10-22 ENCOUNTER — Other Ambulatory Visit: Payer: Self-pay | Admitting: Internal Medicine

## 2021-10-22 ENCOUNTER — Other Ambulatory Visit: Payer: Self-pay

## 2021-11-10 ENCOUNTER — Encounter: Payer: Self-pay | Admitting: Internal Medicine

## 2021-11-10 ENCOUNTER — Ambulatory Visit (INDEPENDENT_AMBULATORY_CARE_PROVIDER_SITE_OTHER): Payer: BC Managed Care – PPO | Admitting: Internal Medicine

## 2021-11-10 ENCOUNTER — Other Ambulatory Visit: Payer: Self-pay

## 2021-11-10 VITALS — BP 120/80 | HR 67 | Ht 64.0 in | Wt 184.6 lb

## 2021-11-10 DIAGNOSIS — Z794 Long term (current) use of insulin: Secondary | ICD-10-CM | POA: Diagnosis not present

## 2021-11-10 DIAGNOSIS — E1169 Type 2 diabetes mellitus with other specified complication: Secondary | ICD-10-CM

## 2021-11-10 DIAGNOSIS — E11319 Type 2 diabetes mellitus with unspecified diabetic retinopathy without macular edema: Secondary | ICD-10-CM

## 2021-11-10 DIAGNOSIS — E785 Hyperlipidemia, unspecified: Secondary | ICD-10-CM

## 2021-11-10 LAB — POCT GLYCOSYLATED HEMOGLOBIN (HGB A1C): Hemoglobin A1C: 7.7 % — AB (ref 4.0–5.6)

## 2021-11-10 MED ORDER — INSULIN NPH (HUMAN) (ISOPHANE) 100 UNIT/ML ~~LOC~~ SUSP
15.0000 [IU] | Freq: Two times a day (BID) | SUBCUTANEOUS | 3 refills | Status: DC
Start: 2021-11-10 — End: 2022-03-24

## 2021-11-10 MED ORDER — METFORMIN HCL 1000 MG PO TABS
1000.0000 mg | ORAL_TABLET | Freq: Two times a day (BID) | ORAL | 3 refills | Status: DC
Start: 2021-11-10 — End: 2022-07-05

## 2021-11-10 MED ORDER — NOVOLIN R 100 UNIT/ML IJ SOLN: INTRAMUSCULAR | 3 refills | Status: DC

## 2021-11-10 NOTE — Progress Notes (Signed)
Patient ID: [name], female   DOB: 08-24-1961, 60 y.o.   MRN: [id number]  This visit occurred during the SARS-CoV-2 public health emergency.  Safety protocols were in place, including screening questions prior to the visit, additional usage of staff PPE, and extensive cleaning of exam room while observing appropriate contact time as indicated for disinfecting solutions.   HPI: [name] is a 60 y.o.-year-old female, returning for f/u for DM2, dx ~2000, insulin-dependent since 2010, uncontrolled, with complications (PN, + DR). Last visit 8 months ago. She is not seeing Dr. Derrel Nip anymore as she does not want to drive to [facility].  Interim history: She continues to have generalized aches and pains. No increased urination, blurry vision, chest pain. She went to the emergency room 06/28/2021 for nausea, vomiting, and abdominal pain.  Her lipase was high.  Ozempic was stopped. She still has N/V. She was also in the ED post a fall down the stairs 08/2021. L forearm in a splint >> will see ortho.  Pain in the entire arm.  Reviewed HbA1c levels: Lab Results  Component Value Date   HGBA1C 7.3 (A) 01/24/2021   HGBA1C 7.1 (A) 09/13/2020   HGBA1C 6.4 (A) 05/13/2020   HGBA1C 7.6 (A) 08/31/2019   HGBA1C 7.8 (A) 11/25/2018   HGBA1C 6.8 (A) 07/25/2018   HGBA1C 6.4% 03/03/2018   HGBA1C 6.5 11/01/2017   HGBA1C 6.2 07/23/2017   HGBA1C 6.8 04/09/2017   HGBA1C 6.4 01/08/2017   HGBA1C 6.4 10/08/2016   HGBA1C 10.4 07/07/2016   HGBA1C 10.4 04/07/2016   HGBA1C 9.3 01/14/2016   HGBA1C 10.9 (H) 09/25/2015   HGBA1C 11.2 09/19/2015   HGBA1C 7.7 06/21/2015   HGBA1C 7.9 (H) 03/22/2015   HGBA1C 9.4 (H) 01/04/2015   HGBA1C 8.8 (H) 10/04/2014   HGBA1C 10.3 (H) 07/06/2014   HGBA1C 7.0 (H) 03/26/2014   HGBA1C 9.5 (H) 12/26/2013   HGBA1C 7.5 (H) 09/20/2013   HGBA1C 8.5 (H) 06/19/2013   HGBA1C 10.3 (H) 01/31/2013   HGBA1C 10.5 (H) 05/27/2012   HGBA1C 8.4 (H) 11/20/2011   HGBA1C 9.5 (H) 08/11/2011    Pt is on:  Insulin Before breakfast Before lunch Before dinner  R 10 10 20-25  NPH 15 - 20  Also: - Metformin 1000 mg twice a day  Pt is checking her sugars 1-2x a day -no log, no meter: - am: 69, 89-144, 216 >> 114-160, 245 (forgot insulin) >> 92-154 >> 148 >> 83-200 - 2h after b'fast: 154 >> n/c > 203 >> n/c >> 223 >> n/c - lunch: 81, 87 >> 100-130 >> 110-120 >> ? - mid-afternoon:  248 >> 116, 189 >> n/c - before dinner: 135 >> 108-114 >> n/c  - 2h after dinner:  216, 219, 451 >> n/c  - bedtime:  104-159 >> 150-180 >> 174-200 >> 160-170 - nighttime:  n/c >> 188, 200, 497 >> n/c >> 40s x1 Lowest sugar was 80s >> 29 (?) in 11/2020 >> 40s x1; she has hypoglycemia awareness in the 80s. Highest sugar was  497 ...>> 200s >> 300s.  -No CKD, last BUN/creatinine:  Lab Results  Component Value Date   BUN 14 06/28/2021   CREATININE 0.79 06/28/2021   ACR was normal: Lab Results  Component Value Date   MICRALBCREAT 3 01/24/2021   MICRALBCREAT 1.8 06/27/2018   MICRALBCREAT 1.6 06/23/2017   MICRALBCREAT 0.7 06/19/2016   MICRALBCREAT 0.4 09/25/2015   MICRALBCREAT 0.3 07/30/2014   MICRALBCREAT 0.5 06/19/2013   MICRALBCREAT 1.5 01/31/2013  MICRALBCREAT 2.6 11/20/2011  Not on ACE inhibitor/ARB  -+ HL; last set of lipids: Lab Results  Component Value Date   CHOL 124 01/24/2021   HDL 51 01/24/2021   LDLCALC 55 01/24/2021   LDLDIRECT 94.0 09/25/2015   TRIG 99 01/24/2021   CHOLHDL 2.4 01/24/2021  On Lipitor 40, Zetia 10.  - last eye exam was in 12/2018: + DR OU, reportedly glaucoma. She sees Dr. Arnoldo Morale.  She has an appointment coming up.  -She has numbness and tingling in her feet.  She is on Neurontin.  She sees podiatry.  She also has a history of HTN, episodic alcohol abuse, GERD, low back pain, OA.   No h/o pancreatitis or FH MTC.  ROS: Musculoskeletal: + muscle aches/+ joint aches Neurological: no tremors/+ numbness/+ tingling/no dizziness  I reviewed pt's  medications, allergies, PMH, social hx, family hx, and changes were documented in the history of present illness. Otherwise, unchanged from my initial visit note.  Past Medical History:  Diagnosis Date   Arthritis    Campylobacter enteritis May 2012   [facility] admission   Carpal tunnel syndrome, bilateral    Diabetes mellitus    Gastritis with bleeding due to alcohol 2010   [facility] admission   GERD (gastroesophageal reflux disease)    Hyperlipidemia    Hypertension    no meds   Menopause    Myocardial infarction [facility])    Neck pain    Wears glasses    Past Surgical History:  Procedure Laterality Date   CARPAL TUNNEL RELEASE Left 05/14/2014   Procedure: LEFT ULNAR NEUROPLASTY AT ELBOW AND ENDOSCOPIC CARPAL TUNNEL RELEASE;  Surgeon: Jolyn Nap, MD;  Location: [place];  Service: Orthopedics;  Laterality: Left;   CORONARY STENT INTERVENTION N/A 10/09/2019   Procedure: CORONARY STENT INTERVENTION;  Surgeon: Adrian Prows, MD;  Location: [place] [facility];  Service: Cardiovascular;  Laterality: N/A;   CYST EXCISION  1995   under arms   endometrial biopsy  2004   benign   FOOT OSTEOTOMY  2010   right-spurs   LEFT HEART CATH AND CORONARY ANGIOGRAPHY N/A 10/09/2019   Procedure: LEFT HEART CATH AND CORONARY ANGIOGRAPHY;  Surgeon: Adrian Prows, MD;  Location: [place] [facility];  Service: Cardiovascular;  Laterality: N/A;   TONSILLECTOMY     ULNAR NERVE TRANSPOSITION Left 05/14/2014   Procedure: ULNAR NERVE DECOMPRESSION/TRANSPOSITION;  Surgeon: Jolyn Nap, MD;  Location: [place];  Service: Orthopedics;  Laterality: Left;   Social History   Socioeconomic History   Marital status: Single    Spouse name: Not on file   Number of children: 2   Years of education: 12   Highest education level: High school graduate  Occupational History   Not on file  Tobacco Use   Smoking status: Every Day    Types: Cigarettes   Smokeless tobacco: Never  Vaping Use    Vaping Use: Never used  Substance and Sexual Activity   Alcohol use: Not Currently   Drug use: Yes    Types: Marijuana    Comment: sometimes   Sexual activity: Yes    Birth control/protection: None    Comment: same partner x 10 years  Other Topics Concern   Not on file  Social History Narrative   She works as a Sports coach.   Right-handed.   One cup caffeine per day.   She lives at home with daughter.   Social Determinants of Radio broadcast assistant  Strain: Not on file  Food Insecurity: Not on file  Transportation Needs: Not on file  Physical Activity: Not on file  Stress: Not on file  Social Connections: Not on file  Intimate Partner Violence: Not on file   Current Outpatient Medications on File Prior to Visit  Medication Sig Dispense Refill   amLODipine-valsartan (EXFORGE) 5-160 MG tablet Take 1 tablet by mouth every evening. 30 tablet 3   aspirin 81 MG chewable tablet Chew 1 tablet (81 mg total) by mouth daily.     atorvastatin (LIPITOR) 40 MG tablet Take 1 tablet (40 mg total) by mouth daily. 90 tablet 3   buPROPion (WELLBUTRIN SR) 150 MG 12 hr tablet Take 1 tablet (150 mg total) by mouth 2 (two) times daily at 10 am and 4 pm. Start tablet daily for 3 days (Patient not taking: Reported on 09/26/2021) 60 tablet 2   cetirizine (ZYRTEC ALLERGY) 10 MG tablet Take 1 tablet (10 mg total) by mouth daily. 30 tablet 0   clindamycin (CLEOCIN) 300 MG capsule Take 300 mg by mouth every 6 (six) hours.     Continuous Blood Gluc Receiver (FREESTYLE LIBRE 2 READER) DEVI 1 each by Does not apply route daily. 1 each 0   Continuous Blood Gluc Sensor (FREESTYLE LIBRE 2 SENSOR) MISC 1 each by Does not apply route every 14 (fourteen) days. 6 each 3   ezetimibe (ZETIA) 10 MG tablet Take 1 tablet (10 mg total) by mouth daily after supper. 90 tablet 3   gabapentin (NEURONTIN) 600 MG tablet Take 600 mg by mouth 3 (three) times daily.     glucose blood (BAYER CONTOUR TEST) test strip TEST BLOOD  SUGARS 3 TIMES DAILY 200 each 11   insulin NPH Human (NOVOLIN N RELION) 100 UNIT/ML injection Inject 0.2-0.55 mLs (20-55 Units total) into the skin 2 (two) times daily before a meal. 99 mL 6   Lancet Devices (EASY TOUCH LANCING DEVICE) MISC Use to check diabetes 4 times daily  E 11.65 1 each 11   latanoprost (XALATAN) 0.005 % ophthalmic solution Place 1 drop into both eyes at bedtime.     metFORMIN (GLUCOPHAGE) 1000 MG tablet TAKE 1 TABLET BY MOUTH TWICE DAILY WITH A MEAL 180 tablet 0   metoprolol succinate (TOPROL-XL) 50 MG 24 hr tablet TAKE 1 TABLET BY MOUTH ONCE DAILY. TAKE WITH OR IMMEDIATELY FOLLOWING A MEAL 30 tablet 3   Multiple Vitamin (MULTIVITAMIN) tablet Take 1 tablet by mouth daily.     naloxone (NARCAN) nasal spray 4 mg/0.1 mL Place 1 spray into the nose as needed.     NOVOLIN R 100 UNIT/ML injection INJECT 10 TO 25 UNITS SUBCUTANEOUSLY THREE TIMES DAILY BEFORE MEALS 70 mL 3   nystatin (NYSTATIN) powder Apply powder to affected area twice daily until rash has resolved 15 g 3   ondansetron (ZOFRAN ODT) 4 MG disintegrating tablet 4mg  ODT q4 hours prn nausea/vomit 20 tablet 0   oxyCODONE-acetaminophen (PERCOCET) 7.5-325 MG tablet Take 1 tablet by mouth 4 (four) times daily as needed.     pantoprazole (PROTONIX) 40 MG tablet Take 40 mg by mouth daily.     Semaglutide,0.25 or 0.5MG /DOS, (OZEMPIC, 0.25 OR 0.5 MG/DOSE,) 2 MG/1.5ML SOPN Inject 0.5 mg into the skin once a week. 6 mL 5   tiZANidine (ZANAFLEX) 4 MG tablet Take 4 mg by mouth 3 (three) times daily.     vitamin B-12 (CYANOCOBALAMIN) 1000 MCG tablet Take 1,000 mcg by mouth daily.     No  current facility-administered medications on file prior to visit.   Allergies  Allergen Reactions   Oxycodone-Acetaminophen Other (See Comments)    Causes her to feel "hot".   Penicillins Rash    Did it involve swelling of the face/tongue/throat, SOB, or low BP? No Did it involve sudden or severe rash/hives, skin peeling, or any reaction on the  inside of your mouth or nose? No Did you need to seek medical attention at a hospital or doctor's office? No When did it last happen?       If all above answers are "NO", may proceed with cephalosporin use.   Family History  Problem Relation Age of Onset   Hypertension Mother    Diabetes Mother    Heart attack Mother    Stroke Father    Diabetes Father    Diabetes Maternal Grandmother    Hypertension Maternal Grandmother    Heart attack Sister     PE: BP 120/80 (BP Location: Right Arm, Patient Position: Sitting, Cuff Size: Normal)   Pulse 67   Ht 5\' 4"  (1.626 m)   Wt 184 lb 9.6 oz (83.7 kg)   SpO2 98%   BMI 31.69 kg/m   calculate BMI Wt Readings from Last 3 Encounters:  11/10/21 184 lb 9.6 oz (83.7 kg)  09/26/21 183 lb 9.6 oz (83.3 kg)  09/08/21 192 lb 0.3 oz (87.1 kg)   Constitutional: overweight, in NAD Eyes: PERRLA, EOMI, no exophthalmos ENT: moist mucous membranes, no thyromegaly, no cervical lymphadenopathy Cardiovascular: RRR, No MRG Respiratory: CTA B Musculoskeletal: no deformities, strength intact in all 4 Skin: moist, warm, no rashes Neurological: no tremor with outstretched hands, DTR normal in all 4  ASSESSMENT: 1. DM2, insulin-dependent, uncontrolled, with complications - peripheral neuropathy - DR   2. Obesity class 1 - BMI 32.79  BMI Classification: < 18.5 underweight  18.5-24.9 normal weight  25.0-29.9 overweight  30.0-34.9 class I obesity  35.0-39.9 class II obesity  ? 40.0 class III obesity   3. HL  PLAN:  1. Patient with longstanding, uncontrolled, type 2 diabetes, with improved control more recently after she decided to take her insulin consistently.  She had an NSTEMI in 10/2019 with 3 subsequent stents and had cardiac rehab afterwards.  Sugars improved and HbA1c decreased to 6.4%.  However, she started to have low blood sugars, as low as 29 reportedly, and she decreased her insulin afterwards.  At last visit, sugars were higher.  At that  time, we adjusted her insulin doses and we tried to add Ozempic, but she is not on this now.  She had a high lipase in the ED on 06/28/2021.  Latest HbA1c was 7.3%. -We tried to get her CGM in the past but this was not covered by her insurance - at this visit, sugars appear to be quite variable.  However, she does not bring a log or meter and cannot remember the values well.  I am reticent to make changes in her regimen based on these sparse data but we discussed about how to vary the dose of regular insulin and how to take it correctly.  She tells me that DNR insulins are not in stock at her pharmacy and I gave her written prescription so she can get them from another [place].  These are free for her with prescriptions. -I strongly advised her to start writing her sugars down before next visit and I am going to see her sooner, in 1.5 months with her sugar log. - I  advised her to: Patient Instructions  Please continue: - Metformin 1000 mg 2x a day  Insulin Before breakfast Before lunch Before dinner  R 10 10 20-25  NPH 15 - 20    Please return in 1.5 months with your sugar log.  - we checked her HbA1c: 7.7% (higher) - advised to check sugars at different times of the day - 4x a day, rotating check times - advised for yearly eye exams >> she is not UTD but has an appointment coming up - return to clinic in 1.5 months   2. Obesity class 1 -At last visit I suggested to start Ozempic, which should also have helped with weight loss.  Unfortunately, she had to come off Ozempic as she had N/V/AP/a high lipase in 05/2021. -Weight was stable at last visit, but lost 8 pounds since then  3. HL -Reviewed latest lipid panel from 12/2020: All fractions at goal: Lab Results  Component Value Date   CHOL 124 01/24/2021   HDL 51 01/24/2021   LDLCALC 55 01/24/2021   LDLDIRECT 94.0 09/25/2015   TRIG 99 01/24/2021   CHOLHDL 2.4 01/24/2021  -On Lipitor 40 mg daily and Zetia 10 mg daily, without side  effects   Philemon Kingdom, MD PhD [facility]

## 2021-11-10 NOTE — Patient Instructions (Addendum)
Please continue: - Metformin 1000 mg 2x a day  Insulin Before breakfast Before lunch Before dinner  R 10 10 20-25  NPH 15 - 20    Please return in 1.5 months.

## 2021-11-13 ENCOUNTER — Ambulatory Visit: Payer: Self-pay

## 2021-11-13 ENCOUNTER — Ambulatory Visit (INDEPENDENT_AMBULATORY_CARE_PROVIDER_SITE_OTHER): Payer: BC Managed Care – PPO | Admitting: Orthopedic Surgery

## 2021-11-13 ENCOUNTER — Other Ambulatory Visit: Payer: Self-pay | Admitting: Orthopedic Surgery

## 2021-11-13 ENCOUNTER — Other Ambulatory Visit: Payer: Self-pay

## 2021-11-13 DIAGNOSIS — M79602 Pain in left arm: Secondary | ICD-10-CM | POA: Diagnosis not present

## 2021-11-13 DIAGNOSIS — M654 Radial styloid tenosynovitis [de Quervain]: Secondary | ICD-10-CM

## 2021-11-13 MED ORDER — LIDOCAINE HCL 1 % IJ SOLN
1.0000 mL | INTRAMUSCULAR | Status: AC | PRN
Start: 2021-11-13 — End: 2021-11-13
  Administered 2021-11-13: 1 mL

## 2021-11-13 MED ORDER — BETAMETHASONE SOD PHOS & ACET 6 (3-3) MG/ML IJ SUSP
6.0000 mg | INTRAMUSCULAR | Status: AC | PRN
Start: 2021-11-13 — End: 2021-11-13
  Administered 2021-11-13: 6 mg via INTRA_ARTICULAR

## 2021-11-13 NOTE — Progress Notes (Signed)
Office Visit Note   Patient: [name]           Date of Birth: Sep 12, 1961           MRN: [id number] Visit Date: 11/13/2021              Requested by: Carylon Perches, NP [place],  [place] [address] PCP: Carylon Perches, NP   Assessment & Plan: Visit Diagnoses:  1. Left arm pain   2. De Quervain's tenosynovitis, left     Plan: We discussed the diagnosis, prognosis, non-operative and operative treatment options for De Quervain's tenosynovitis.  After our discussion, the patient would like to proceed with corticosteroid injection.  We reviewed the risks and benefits of conservative management.  The patient expressed understanding of the reasoning and strategy going forward.  All patient questions and concerns were addressed.    Follow-Up Instructions: No follow-ups on file.   Orders:  No orders of the defined types were placed in this encounter.  No orders of the defined types were placed in this encounter.     Procedures: Hand/UE Inj: R extensor compartment 1 for de Quervain's tenosynovitis on 11/13/2021 3:20 PM Indications: pain Details: 25 G needle, radial approach Medications: 1 mL lidocaine 1 %; 6 mg betamethasone acetate-betamethasone sodium phosphate 6 (3-3) MG/ML Outcome: tolerated well, no immediate complications Procedure, treatment alternatives, risks and benefits explained, specific risks discussed. Consent was given by the patient. Patient was prepped and draped in the usual sterile fashion.      Clinical Data: No additional findings.   Subjective: Chief Complaint  Patient presents with   Other     Left arm pain     This is a 60 yo RHD F who presents with right radial sided wrist pain.  It started after a fall in early October. X-rays at that time were largely unremarkable.  Today she describes diffuse wrist pain that is worst directly over the radial styloid and radiating proximally and distally.  She often wakes with radial  sided wrist pain.  She has been wearing a wrist brace which helps some.     Review of Systems   Objective: Vital Signs: There were no vitals taken for this visit.  Physical Exam Constitutional:      Appearance: Normal appearance.  Cardiovascular:     Rate and Rhythm: Normal rate.     Pulses: Normal pulses.  Pulmonary:     Effort: Pulmonary effort is normal.  Skin:    General: Skin is warm and dry.     Capillary Refill: Capillary refill takes less than 2 seconds.  Neurological:     Mental Status: She is alert.    Right Hand Exam   Tenderness  Right hand tenderness location: TTP directly over radial styloid and first dorsal compartment.  Mild to moderate swelling in this area.  Range of Motion  The patient has normal right wrist ROM.   Other  Erythema: absent Sensation: normal Pulse: present  Comments:  + Finkelstein test which reproduces her worst pain.  No pain w/ ROM of wrist.  No foveal pain.  Minimal pain w/ CMC grind test.      Specialty Comments:  No specialty comments available.  Imaging: Multiple views of the right wrist taken today are reviewed and interpreted by me.  They demonstrate maintained radiocarpal, midcarpal, and CMC joint spaces.  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  There is no acute fracture or bony lesion.   PMFS History: Patient  Active Problem List   Diagnosis Date Noted   De Quervain's tenosynovitis, left 11/13/2021   Bilateral injections given May 21, 2020 degenerative arthritis of knee, bilateral 05/21/2020   Traumatic hematoma of female breast 11/29/2019   Dizziness 11/29/2019   Hospital discharge follow-up 10/23/2019   NSTEMI (non-ST elevated myocardial infarction) ([place]) 10/10/2019   CAD (coronary artery disease) 10/10/2019   Chest pain 10/09/2019   Elevated troponin 10/09/2019   Uncontrolled type 2 diabetes mellitus with hyperglycemia ([place]) 10/09/2019   Obesity (BMI 30.0-34.9) 10/09/2019   Cervical spondylitis with  radiculitis ([place]) 08/29/2019   Acute bursitis of left shoulder 01/30/2019   Chronic midline posterior neck pain 06/28/2018   Urgency incontinence 06/28/2018   Degenerative joint disease of knee, left 04/26/2018   Carpal tunnel syndrome on both sides 03/29/2018   Anemia 01/02/2018   Class 1 obesity with serious comorbidity and body mass index (BMI) of 32.0 to 32.9 in adult 07/23/2017   Left hip pain 05/04/2017   Carpal tunnel syndrome on right 12/27/2016   Polyarthralgia 11/09/2016   Uncontrolled type 2 diabetes mellitus with retinopathy, with long-term current use of insulin 07/07/2016   Visit for preventive health examination 06/21/2016   Increased urinary frequency 06/21/2016   Chronic meniscal tear of knee 08/19/2015   Left knee pain 06/19/2015   Candidiasis of skin 06/18/2015   Genital herpes 07/31/2014   Ulnar nerve compression 09/21/2013   Noncompliance with diet and medication regimen 11/15/2012   Boil, axilla 08/14/2012   Arthritis    Screening for breast cancer 08/04/2011   Screening for colon cancer 08/04/2011   HIDRADENITIS SUPPURATIVA 04/11/2007   GERD 11/17/2006   ENDOMETRIAL POLYP 11/17/2006   LOW BACK PAIN 11/17/2006   FIBROIDS, UTERUS 11/16/2006   Hyperlipidemia associated with type 2 diabetes mellitus ([place]) 11/16/2006   Essential hypertension 11/16/2006   Past Medical History:  Diagnosis Date   Arthritis    Campylobacter enteritis May 2012   [facility] admission   Carpal tunnel syndrome, bilateral    Diabetes mellitus    Gastritis with bleeding due to alcohol 2010   [facility] admission   GERD (gastroesophageal reflux disease)    Hyperlipidemia    Hypertension    no meds   Menopause    Myocardial infarction ([place])    Neck pain    Wears glasses     Family History  Problem Relation Age of Onset   Hypertension Mother    Diabetes Mother    Heart attack Mother    Stroke Father    Diabetes Father    Diabetes Maternal Grandmother    Hypertension Maternal  Grandmother    Heart attack Sister     Past Surgical History:  Procedure Laterality Date   CARPAL TUNNEL RELEASE Left 05/14/2014   Procedure: LEFT ULNAR NEUROPLASTY AT ELBOW AND ENDOSCOPIC CARPAL TUNNEL RELEASE;  Surgeon: Jolyn Nap, MD;  Location: [place];  Service: Orthopedics;  Laterality: Left;   CORONARY STENT INTERVENTION N/A 10/09/2019   Procedure: CORONARY STENT INTERVENTION;  Surgeon: Adrian Prows, MD;  Location: [place] [facility];  Service: Cardiovascular;  Laterality: N/A;   CYST EXCISION  1995   under arms   endometrial biopsy  2004   benign   FOOT OSTEOTOMY  2010   right-spurs   LEFT HEART CATH AND CORONARY ANGIOGRAPHY N/A 10/09/2019   Procedure: LEFT HEART CATH AND CORONARY ANGIOGRAPHY;  Surgeon: Adrian Prows, MD;  Location: [place] [facility];  Service: Cardiovascular;  Laterality: N/A;  TONSILLECTOMY     ULNAR NERVE TRANSPOSITION Left 05/14/2014   Procedure: ULNAR NERVE DECOMPRESSION/TRANSPOSITION;  Surgeon: Jolyn Nap, MD;  Location: [place];  Service: Orthopedics;  Laterality: Left;   Social History   Occupational History   Not on file  Tobacco Use   Smoking status: Every Day    Types: Cigarettes   Smokeless tobacco: Never  Vaping Use   Vaping Use: Never used  Substance and Sexual Activity   Alcohol use: Not Currently   Drug use: Yes    Types: Marijuana    Comment: sometimes   Sexual activity: Yes    Birth control/protection: None    Comment: same partner x 10 years

## 2021-12-25 ENCOUNTER — Ambulatory Visit: Payer: BC Managed Care – PPO | Admitting: Internal Medicine

## 2021-12-25 NOTE — Progress Notes (Deleted)
Patient ID: [name], female   DOB: August 17, 1961, 61 y.o.   MRN: [id number]  This visit occurred during the SARS-CoV-2 public health emergency.  Safety protocols were in place, including screening questions prior to the visit, additional usage of staff PPE, and extensive cleaning of exam room while observing appropriate contact time as indicated for disinfecting solutions.   HPI: [name] is a 61 y.o.-year-old female, returning for f/u for DM2, dx ~2000, insulin-dependent since 2010, uncontrolled, with complications (PN, + DR). Last visit 1.5 months ago. She is not seeing Dr. Derrel Nip anymore as she does not want to drive to [facility].  Interim history: She continues to have generalized aches and pains. No increased urination, blurry vision, nausea, chest pain.  Reviewed HbA1c levels: Lab Results  Component Value Date   HGBA1C 7.7 (A) 11/10/2021   HGBA1C 7.3 (A) 01/24/2021   HGBA1C 7.1 (A) 09/13/2020   HGBA1C 6.4 (A) 05/13/2020   HGBA1C 7.6 (A) 08/31/2019   HGBA1C 7.8 (A) 11/25/2018   HGBA1C 6.8 (A) 07/25/2018   HGBA1C 6.4% 03/03/2018   HGBA1C 6.5 11/01/2017   HGBA1C 6.2 07/23/2017   HGBA1C 6.8 04/09/2017   HGBA1C 6.4 01/08/2017   HGBA1C 6.4 10/08/2016   HGBA1C 10.4 07/07/2016   HGBA1C 10.4 04/07/2016   HGBA1C 9.3 01/14/2016   HGBA1C 10.9 (H) 09/25/2015   HGBA1C 11.2 09/19/2015   HGBA1C 7.7 06/21/2015   HGBA1C 7.9 (H) 03/22/2015   HGBA1C 9.4 (H) 01/04/2015   HGBA1C 8.8 (H) 10/04/2014   HGBA1C 10.3 (H) 07/06/2014   HGBA1C 7.0 (H) 03/26/2014   HGBA1C 9.5 (H) 12/26/2013   HGBA1C 7.5 (H) 09/20/2013   HGBA1C 8.5 (H) 06/19/2013   HGBA1C 10.3 (H) 01/31/2013   HGBA1C 10.5 (H) 05/27/2012   HGBA1C 8.4 (H) 11/20/2011   Pt is on:  Insulin Before breakfast Before lunch Before dinner  R 10 10 20-25  NPH 15 - 20  Also: - Metformin 1000 mg twice a day She could not tolerate Ozempic due to nausea, vomiting, high lipase.  Pt is checking her sugars 1-2x a day -no log, no  meter: - am: 69, 89-144, 216 >> 114-160, 245 (forgot insulin) >> 92-154 >> 148 >> 83-200 - 2h after b'fast: 154 >> n/c > 203 >> n/c >> 223 >> n/c - lunch: 81, 87 >> 100-130 >> 110-120 >> ? - mid-afternoon:  248 >> 116, 189 >> n/c - before dinner: 135 >> 108-114 >> n/c  - 2h after dinner:  216, 219, 451 >> n/c  - bedtime:  104-159 >> 150-180 >> 174-200 >> 160-170 - nighttime:  n/c >> 188, 200, 497 >> n/c >> 40s x1 Lowest sugar was 80s >> 29 (?) in 11/2020 >> 40s x1; she has hypoglycemia awareness in the 80s. Highest sugar was  497 ...>> 200s >> 300s.  -No CKD, last BUN/creatinine:  Lab Results  Component Value Date   BUN 14 06/28/2021   CREATININE 0.79 06/28/2021   ACR was normal: Lab Results  Component Value Date   MICRALBCREAT 3 01/24/2021   MICRALBCREAT 1.8 06/27/2018   MICRALBCREAT 1.6 06/23/2017   MICRALBCREAT 0.7 06/19/2016   MICRALBCREAT 0.4 09/25/2015   MICRALBCREAT 0.3 07/30/2014   MICRALBCREAT 0.5 06/19/2013   MICRALBCREAT 1.5 01/31/2013   MICRALBCREAT 2.6 11/20/2011  Not on ACE inhibitor/ARB  -+ HL; last set of lipids: Lab Results  Component Value Date   CHOL 124 01/24/2021   HDL 51 01/24/2021   LDLCALC 55 01/24/2021   LDLDIRECT 94.0 09/25/2015  TRIG 99 01/24/2021   CHOLHDL 2.4 01/24/2021  On Lipitor 40, Zetia 10.  - last eye exam was in 12/2018: + DR OU, reportedly glaucoma. She sees Dr. Arnoldo Morale.    -She has numbness and tingling in her feet.  She is on Neurontin.  She sees podiatry.  She also has a history of HTN, episodic alcohol abuse, GERD, low back pain, OA.  She went to the emergency room 06/28/2021 for nausea, vomiting, and abdominal pain.  Her lipase was high.  Ozempic was stopped.  She was also in the ED post a fall down the stairs 08/2021. L forearm was in a splint. No FH MTC.  ROS: Musculoskeletal: + muscle aches/+ joint aches Neurological: no tremors/+ numbness/+ tingling/no dizziness  I reviewed pt's medications, allergies, PMH, social  hx, family hx, and changes were documented in the history of present illness. Otherwise, unchanged from my initial visit note.  Past Medical History:  Diagnosis Date   Arthritis    Campylobacter enteritis May 2012   [facility] admission   Carpal tunnel syndrome, bilateral    Diabetes mellitus    Gastritis with bleeding due to alcohol 2010   [facility] admission   GERD (gastroesophageal reflux disease)    Hyperlipidemia    Hypertension    no meds   Menopause    Myocardial infarction [facility])    Neck pain    Wears glasses    Past Surgical History:  Procedure Laterality Date   CARPAL TUNNEL RELEASE Left 05/14/2014   Procedure: LEFT ULNAR NEUROPLASTY AT ELBOW AND ENDOSCOPIC CARPAL TUNNEL RELEASE;  Surgeon: Jolyn Nap, MD;  Location: [place];  Service: Orthopedics;  Laterality: Left;   CORONARY STENT INTERVENTION N/A 10/09/2019   Procedure: CORONARY STENT INTERVENTION;  Surgeon: Adrian Prows, MD;  Location: [place] [facility];  Service: Cardiovascular;  Laterality: N/A;   CYST EXCISION  1995   under arms   endometrial biopsy  2004   benign   FOOT OSTEOTOMY  2010   right-spurs   LEFT HEART CATH AND CORONARY ANGIOGRAPHY N/A 10/09/2019   Procedure: LEFT HEART CATH AND CORONARY ANGIOGRAPHY;  Surgeon: Adrian Prows, MD;  Location: [place] [facility];  Service: Cardiovascular;  Laterality: N/A;   TONSILLECTOMY     ULNAR NERVE TRANSPOSITION Left 05/14/2014   Procedure: ULNAR NERVE DECOMPRESSION/TRANSPOSITION;  Surgeon: Jolyn Nap, MD;  Location: [place];  Service: Orthopedics;  Laterality: Left;   Social History   Socioeconomic History   Marital status: Single    Spouse name: Not on file   Number of children: 2   Years of education: 12   Highest education level: High school graduate  Occupational History   Not on file  Tobacco Use   Smoking status: Every Day    Types: Cigarettes   Smokeless tobacco: Never  Vaping Use   Vaping Use: Never used   Substance and Sexual Activity   Alcohol use: Not Currently   Drug use: Yes    Types: Marijuana    Comment: sometimes   Sexual activity: Yes    Birth control/protection: None    Comment: same partner x 10 years  Other Topics Concern   Not on file  Social History Narrative   She works as a Sports coach.   Right-handed.   One cup caffeine per day.   She lives at home with daughter.   Social Determinants of Health   Financial Resource Strain: Not on file  Food Insecurity: Not on file  Transportation Needs: Not on file  Physical Activity: Not on file  Stress: Not on file  Social Connections: Not on file  Intimate Partner Violence: Not on file   Current Outpatient Medications on File Prior to Visit  Medication Sig Dispense Refill   amLODipine-valsartan (EXFORGE) 5-160 MG tablet Take 1 tablet by mouth every evening. 30 tablet 3   aspirin 81 MG chewable tablet Chew 1 tablet (81 mg total) by mouth daily.     atorvastatin (LIPITOR) 40 MG tablet Take 1 tablet (40 mg total) by mouth daily. 90 tablet 3   buPROPion (WELLBUTRIN SR) 150 MG 12 hr tablet Take 1 tablet (150 mg total) by mouth 2 (two) times daily at 10 am and 4 pm. Start tablet daily for 3 days (Patient not taking: Reported on 09/26/2021) 60 tablet 2   cetirizine (ZYRTEC ALLERGY) 10 MG tablet Take 1 tablet (10 mg total) by mouth daily. 30 tablet 0   clindamycin (CLEOCIN) 300 MG capsule Take 300 mg by mouth every 6 (six) hours.     Continuous Blood Gluc Receiver (FREESTYLE LIBRE 2 READER) DEVI 1 each by Does not apply route daily. (Patient not taking: Reported on 11/10/2021) 1 each 0   Continuous Blood Gluc Sensor (FREESTYLE LIBRE 2 SENSOR) MISC 1 each by Does not apply route every 14 (fourteen) days. (Patient not taking: Reported on 11/10/2021) 6 each 3   ezetimibe (ZETIA) 10 MG tablet Take 1 tablet (10 mg total) by mouth daily after supper. 90 tablet 3   gabapentin (NEURONTIN) 600 MG tablet Take 600 mg by mouth 3 (three) times  daily.     glucose blood (BAYER CONTOUR TEST) test strip TEST BLOOD SUGARS 3 TIMES DAILY 200 each 11   insulin NPH Human (NOVOLIN N RELION) 100 UNIT/ML injection Inject 0.15-0.2 mLs (15-20 Units total) into the skin 2 (two) times daily before a meal. 40 mL 3   Lancet Devices (EASY TOUCH LANCING DEVICE) MISC Use to check diabetes 4 times daily  E 11.65 1 each 11   latanoprost (XALATAN) 0.005 % ophthalmic solution Place 1 drop into both eyes at bedtime.     metFORMIN (GLUCOPHAGE) 1000 MG tablet Take 1 tablet (1,000 mg total) by mouth 2 (two) times daily with a meal. 180 tablet 3   metoprolol succinate (TOPROL-XL) 50 MG 24 hr tablet TAKE 1 TABLET BY MOUTH ONCE DAILY. TAKE WITH OR IMMEDIATELY FOLLOWING A MEAL 30 tablet 3   Multiple Vitamin (MULTIVITAMIN) tablet Take 1 tablet by mouth daily.     naloxone (NARCAN) nasal spray 4 mg/0.1 mL Place 1 spray into the nose as needed.     NOVOLIN R 100 UNIT/ML injection INJECT 10 TO 25 UNITS SUBCUTANEOUSLY THREE TIMES DAILY BEFORE MEALS 70 mL 3   nystatin (NYSTATIN) powder Apply powder to affected area twice daily until rash has resolved 15 g 3   ondansetron (ZOFRAN ODT) 4 MG disintegrating tablet 4mg  ODT q4 hours prn nausea/vomit 20 tablet 0   oxyCODONE-acetaminophen (PERCOCET) 7.5-325 MG tablet Take 1 tablet by mouth 4 (four) times daily as needed.     pantoprazole (PROTONIX) 40 MG tablet Take 40 mg by mouth daily.     tiZANidine (ZANAFLEX) 4 MG tablet Take 4 mg by mouth 3 (three) times daily.     vitamin B-12 (CYANOCOBALAMIN) 1000 MCG tablet Take 1,000 mcg by mouth daily.     No current facility-administered medications on file prior to visit.   Allergies  Allergen Reactions   Oxycodone-Acetaminophen Other (See  Comments)    Causes her to feel "hot".   Penicillins Rash    Did it involve swelling of the face/tongue/throat, SOB, or low BP? No Did it involve sudden or severe rash/hives, skin peeling, or any reaction on the inside of your mouth or nose?  No Did you need to seek medical attention at a hospital or doctor's office? No When did it last happen?       If all above answers are "NO", may proceed with cephalosporin use.   Family History  Problem Relation Age of Onset   Hypertension Mother    Diabetes Mother    Heart attack Mother    Stroke Father    Diabetes Father    Diabetes Maternal Grandmother    Hypertension Maternal Grandmother    Heart attack Sister     PE: There were no vitals taken for this visit.   Wt Readings from Last 3 Encounters:  11/10/21 184 lb 9.6 oz (83.7 kg)  09/26/21 183 lb 9.6 oz (83.3 kg)  09/08/21 192 lb 0.3 oz (87.1 kg)   Constitutional: overweight, in NAD Eyes: PERRLA, EOMI, no exophthalmos ENT: moist mucous membranes, no thyromegaly, no cervical lymphadenopathy Cardiovascular: RRR, No MRG Respiratory: CTA B Musculoskeletal: no deformities, strength intact in all 4 Skin: moist, warm, no rashes Neurological: no tremor with outstretched hands, DTR normal in all 4  ASSESSMENT: 1. DM2, insulin-dependent, uncontrolled, with complications - peripheral neuropathy - DR   2. Obesity class 1 - BMI 32.79  BMI Classification: < 18.5 underweight  18.5-24.9 normal weight  25.0-29.9 overweight  30.0-34.9 class I obesity  35.0-39.9 class II obesity  ? 40.0 class III obesity   3. HL  PLAN:  1. Patient with longstanding, uncontrolled, type 2 diabetes, with improved control after she started to take her insulin consistently.  She had an NSTEMI in 09/2018 with 3 subsequent stents and had cardiac rehab afterwards.  Sugars improved and HbA1c decreased to 6.4%.  She started to have low blood sugars, as low as 29, reportedly, and she decreased her insulin afterwards.  Fortunately, she then had to come off Ozempic due to high lipase along with GI symptoms in 05/2021.  HbA1c increased initially to 7.3% and then to 7.7% at last visit.  At that time, she did not bring a log or meter and could not remember the  values well.  I was reticent to make changes in her regimen based on sparse data but we discussed about how to vary the dose of regular insulin and how to take it correctly.  She mentioned that the N and R insulins were not in stock at her pharmacy and I advised her to get these from [place].  I also advised her to start writing sugars down in her log.  - I advised her to: Patient Instructions  Please continue: - Metformin 1000 mg 2x a day  Insulin Before breakfast Before lunch Before dinner  R 10 10 20-25  NPH 15 - 20    Please return in 3 months with your sugar log.  - we checked her HbA1c: 7%  - advised to check sugars at different times of the day - 4x a day, rotating check times - advised for yearly eye exams >> she is UTD - return to clinic in 3 months  2. Obesity class 1 -Unfortunately, she had to come off Ozempic as she had N/V/AP/a high lipase in 05/2021. -She lost 8 pounds before last visit  3. HL -Reviewed latest  lipid panel from 12/2020: All fractions at goal: Lab Results  Component Value Date   CHOL 124 01/24/2021   HDL 51 01/24/2021   LDLCALC 55 01/24/2021   LDLDIRECT 94.0 09/25/2015   TRIG 99 01/24/2021   CHOLHDL 2.4 01/24/2021  -On Lipitor 40 mg daily and Zetia 10 mg daily without side effects  Philemon Kingdom, MD PhD [facility]

## 2022-01-28 ENCOUNTER — Ambulatory Visit: Payer: BC Managed Care – PPO | Admitting: Podiatry

## 2022-02-11 ENCOUNTER — Telehealth: Payer: Self-pay | Admitting: Orthopedic Surgery

## 2022-02-11 NOTE — Telephone Encounter (Signed)
Pt submitted medical release form. Accident forms, and $25.00 cash payment. Accepted 02/11/22 ?

## 2022-02-18 ENCOUNTER — Ambulatory Visit: Payer: BC Managed Care – PPO | Admitting: Podiatry

## 2022-02-18 ENCOUNTER — Other Ambulatory Visit: Payer: Self-pay

## 2022-02-18 DIAGNOSIS — E119 Type 2 diabetes mellitus without complications: Secondary | ICD-10-CM | POA: Diagnosis not present

## 2022-02-18 NOTE — Progress Notes (Signed)
? ?  SUBJECTIVE ?Patient with a history of diabetes mellitus presents to office today complaining of elongated, thickened nails that cause pain while ambulating in shoes.  Patient is unable to trim their own nails. Patient is here for further evaluation and treatment. ? ? ?Past Medical History:  ?Diagnosis Date  ? Arthritis   ? Campylobacter enteritis May 2012  ? [facility] admission  ? Carpal tunnel syndrome, bilateral   ? Diabetes mellitus   ? Gastritis with bleeding due to alcohol 2010  ? [place] admission  ? GERD (gastroesophageal reflux disease)   ? Hyperlipidemia   ? Hypertension   ? no meds  ? Menopause   ? Myocardial infarction [facility])   ? Neck pain   ? Wears glasses   ? ? ?OBJECTIVE ?General Patient is awake, alert, and oriented x 3 and in no acute distress. ?Derm Skin is dry and supple bilateral. Negative open lesions or macerations. Remaining integument unremarkable. Nails are tender, long, thickened and dystrophic with subungual debris, consistent with onychomycosis, 1-5 bilateral. No signs of infection noted. ?Vasc  DP and PT pedal pulses palpable bilaterally. Temperature gradient within normal limits.  ?Neuro Epicritic and protective threshold sensation diminished bilaterally.  ?Musculoskeletal Exam No symptomatic pedal deformities noted bilateral. Muscular strength within normal limits. ? ?ASSESSMENT ?1. Diabetes Mellitus w/ peripheral neuropathy ?2.  Pain due to onychomycosis of toenails bilateral ? ?PLAN OF CARE ?1. Patient evaluated today.  Comprehensive diabetic foot exam performed today ?2. Instructed to maintain good pedal hygiene and foot care. Stressed importance of controlling blood sugar.  ?3. Mechanical debridement of nails 1-5 bilaterally performed using a nail nipper. Filed with dremel without incident.  ?4.  Continue management of diabetes with PCP or endocrinologist.   ?5.  Return to clinic in 3 mos.  ? ? ? ?Edrick Kins, DPM ?[place] ? ?Dr. Edrick Kins, DPM  ?  ?[address]                                      ?[place], [place] [address]                ?Office [phone]  ?Fax [phone] ? ? ? ? ? ?

## 2022-03-23 ENCOUNTER — Other Ambulatory Visit: Payer: Self-pay | Admitting: Internal Medicine

## 2022-04-10 ENCOUNTER — Ambulatory Visit (INDEPENDENT_AMBULATORY_CARE_PROVIDER_SITE_OTHER): Payer: BC Managed Care – PPO | Admitting: Internal Medicine

## 2022-04-10 ENCOUNTER — Encounter: Payer: Self-pay | Admitting: Internal Medicine

## 2022-04-10 VITALS — BP 120/80 | HR 103 | Ht 64.0 in | Wt 183.0 lb

## 2022-04-10 DIAGNOSIS — E785 Hyperlipidemia, unspecified: Secondary | ICD-10-CM

## 2022-04-10 DIAGNOSIS — Z6832 Body mass index (BMI) 32.0-32.9, adult: Secondary | ICD-10-CM

## 2022-04-10 DIAGNOSIS — E11319 Type 2 diabetes mellitus with unspecified diabetic retinopathy without macular edema: Secondary | ICD-10-CM

## 2022-04-10 DIAGNOSIS — E1169 Type 2 diabetes mellitus with other specified complication: Secondary | ICD-10-CM

## 2022-04-10 DIAGNOSIS — E669 Obesity, unspecified: Secondary | ICD-10-CM | POA: Diagnosis not present

## 2022-04-10 DIAGNOSIS — Z794 Long term (current) use of insulin: Secondary | ICD-10-CM

## 2022-04-10 LAB — POCT GLYCOSYLATED HEMOGLOBIN (HGB A1C): Hemoglobin A1C: 6.5 % — AB (ref 4.0–5.6)

## 2022-04-10 MED ORDER — INSULIN PEN NEEDLE 32G X 4 MM MISC: 3 refills | Status: DC

## 2022-04-10 MED ORDER — FIASP FLEXTOUCH 100 UNIT/ML ~~LOC~~ SOPN: 6.0000 [IU] | PEN_INJECTOR | Freq: Three times a day (TID) | SUBCUTANEOUS | 3 refills | Status: DC

## 2022-04-10 MED ORDER — FREESTYLE LIBRE 3 SENSOR MISC: 1.0000 | 3 refills | Status: DC

## 2022-04-10 MED ORDER — TRESIBA FLEXTOUCH 200 UNIT/ML ~~LOC~~ SOPN: 30.0000 [IU] | PEN_INJECTOR | Freq: Every day | SUBCUTANEOUS | 3 refills | Status: DC

## 2022-04-10 NOTE — Patient Instructions (Addendum)
Please continue: ?- Metformin 1000 mg 2x a day ? ? ?Insulin Before breakfast Before lunch Before dinner  ?R 10 10 (6 units if you are active after the meal) 20-25  ?NPH 15 - 20  ?  ? ?Try to change from NPH and regular to: ?- Tresiba U200 30 units daily ?- FiAsp 6-10 units before b'fast and lunch and 18-20 units before dinner ? ?Please return in 3 months with your sugar log. ?

## 2022-04-10 NOTE — Progress Notes (Signed)
Patient ID: [name], female   DOB: 09/19/1961, 61 y.o.   MRN: [id number]  This visit occurred during the SARS-CoV-2 public health emergency.  Safety protocols were in place, including screening questions prior to the visit, additional usage of staff PPE, and extensive cleaning of exam room while observing appropriate contact time as indicated for disinfecting solutions.   HPI: [name] is a 61 y.o.-year-old female, returning for f/u for DM2, dx ~2000, insulin-dependent since 2010, uncontrolled, with complications (PN, + DR). Last visit 5 months ago. She is not seeing Dr. Derrel Nip anymore as she does not want to drive to [facility].  Interim history: She continues to have generalized aches and pains. No increased urination, blurry vision, chest pain. At the time of the visit, she felt tremulous >> CBG 244.  Reviewed HbA1c levels: Lab Results  Component Value Date   HGBA1C 7.7 (A) 11/10/2021   HGBA1C 7.3 (A) 01/24/2021   HGBA1C 7.1 (A) 09/13/2020   HGBA1C 6.4 (A) 05/13/2020   HGBA1C 7.6 (A) 08/31/2019   HGBA1C 7.8 (A) 11/25/2018   HGBA1C 6.8 (A) 07/25/2018   HGBA1C 6.4% 03/03/2018   HGBA1C 6.5 11/01/2017   HGBA1C 6.2 07/23/2017   HGBA1C 6.8 04/09/2017   HGBA1C 6.4 01/08/2017   HGBA1C 6.4 10/08/2016   HGBA1C 10.4 07/07/2016   HGBA1C 10.4 04/07/2016   HGBA1C 9.3 01/14/2016   HGBA1C 10.9 (H) 09/25/2015   HGBA1C 11.2 09/19/2015   HGBA1C 7.7 06/21/2015   HGBA1C 7.9 (H) 03/22/2015   HGBA1C 9.4 (H) 01/04/2015   HGBA1C 8.8 (H) 10/04/2014   HGBA1C 10.3 (H) 07/06/2014   HGBA1C 7.0 (H) 03/26/2014   HGBA1C 9.5 (H) 12/26/2013   HGBA1C 7.5 (H) 09/20/2013   HGBA1C 8.5 (H) 06/19/2013   HGBA1C 10.3 (H) 01/31/2013   HGBA1C 10.5 (H) 05/27/2012   HGBA1C 8.4 (H) 11/20/2011   Pt is on:  Insulin Before breakfast Before lunch Before dinner  R 10 10 20-25  NPH 15 - 20  Also: - Metformin 1000 mg twice a day  Pt is checking her sugars 1-2x a day -per  meter: - am: 114-160, 245  (forgot insulin) >> 92-154 >> 148 >> 83-200 >> 100-156, 185, 242 - 2h after b'fast: 154 >> n/c > 203 >> n/c >> 223 >> n/c - lunch: 81, 87 >> 100-130 >> 110-120 >> 202 - mid-afternoon:  248 >> 116, 189 >> 35, 56, 223 - before dinner: 135 >> 108-114 >> n/c  - 2h after dinner:  216, 219, 451 >> n/c  >> 164 - bedtime:  104-159 >> 150-180 >> 174-200 >> 160-170 >> 131, 189 - nighttime:  n/c >> 188, 200, 497 >> n/c >> 40s x1 >> ? Lowest sugar was 80s >> 29 (?) in 11/2020 >> 40s x1 >> 35 (skipped the meal); she has hypoglycemia awareness in the 80s. Highest sugar was  497 ...>> 200s >> 300s.  -No CKD, last BUN/creatinine:  Lab Results  Component Value Date   BUN 14 06/28/2021   CREATININE 0.79 06/28/2021   ACR was normal: Lab Results  Component Value Date   MICRALBCREAT 3 01/24/2021   MICRALBCREAT 1.8 06/27/2018   MICRALBCREAT 1.6 06/23/2017   MICRALBCREAT 0.7 06/19/2016   MICRALBCREAT 0.4 09/25/2015   MICRALBCREAT 0.3 07/30/2014   MICRALBCREAT 0.5 06/19/2013   MICRALBCREAT 1.5 01/31/2013   MICRALBCREAT 2.6 11/20/2011  Not on ACE inhibitor/ARB  -+ HL; last set of lipids: Lab Results  Component Value Date   CHOL 124 01/24/2021  HDL 51 01/24/2021   LDLCALC 55 01/24/2021   LDLDIRECT 94.0 09/25/2015   TRIG 99 01/24/2021   CHOLHDL 2.4 01/24/2021  On Lipitor 40, Zetia 10.  - last eye exam was in 11/2021: + DR OU, reportedly glaucoma. She sees Dr. Arnoldo Morale.    -She has numbness and tingling in her feet.  She is on Neurontin.  She sees podiatry.  Last foot exam 01/2022.  She also has a history of HTN, episodic alcohol abuse, GERD, low back pain, OA.  She went to the emergency room 06/28/2021 for nausea, vomiting, and abdominal pain.  Her lipase was high.  Ozempic was stopped. She was also in the ED post a fall down the stairs 08/2021. L forearm was in a splint >> saw ortho  ROS: Musculoskeletal: + muscle aches/+ joint aches Neurological: no tremors/+ numbness/+ tingling/no  dizziness  I reviewed pt's medications, allergies, PMH, social hx, family hx, and changes were documented in the history of present illness. Otherwise, unchanged from my initial visit note.  Past Medical History:  Diagnosis Date   Arthritis    Campylobacter enteritis May 2012   [facility] admission   Carpal tunnel syndrome, bilateral    Diabetes mellitus    Gastritis with bleeding due to alcohol 2010   [facility] admission   GERD (gastroesophageal reflux disease)    Hyperlipidemia    Hypertension    no meds   Menopause    Myocardial infarction [facility])    Neck pain    Wears glasses    Past Surgical History:  Procedure Laterality Date   CARPAL TUNNEL RELEASE Left 05/14/2014   Procedure: LEFT ULNAR NEUROPLASTY AT ELBOW AND ENDOSCOPIC CARPAL TUNNEL RELEASE;  Surgeon: Jolyn Nap, MD;  Location: [place];  Service: Orthopedics;  Laterality: Left;   CORONARY STENT INTERVENTION N/A 10/09/2019   Procedure: CORONARY STENT INTERVENTION;  Surgeon: Adrian Prows, MD;  Location: [place] [facility];  Service: Cardiovascular;  Laterality: N/A;   CYST EXCISION  1995   under arms   endometrial biopsy  2004   benign   FOOT OSTEOTOMY  2010   right-spurs   LEFT HEART CATH AND CORONARY ANGIOGRAPHY N/A 10/09/2019   Procedure: LEFT HEART CATH AND CORONARY ANGIOGRAPHY;  Surgeon: Adrian Prows, MD;  Location: [place] [facility];  Service: Cardiovascular;  Laterality: N/A;   TONSILLECTOMY     ULNAR NERVE TRANSPOSITION Left 05/14/2014   Procedure: ULNAR NERVE DECOMPRESSION/TRANSPOSITION;  Surgeon: Jolyn Nap, MD;  Location: [place];  Service: Orthopedics;  Laterality: Left;   Social History   Socioeconomic History   Marital status: Single    Spouse name: Not on file   Number of children: 2   Years of education: 12   Highest education level: High school graduate  Occupational History   Not on file  Tobacco Use   Smoking status: Every Day    Types: Cigarettes   Smokeless  tobacco: Never  Vaping Use   Vaping Use: Never used  Substance and Sexual Activity   Alcohol use: Not Currently   Drug use: Yes    Types: Marijuana    Comment: sometimes   Sexual activity: Yes    Birth control/protection: None    Comment: same partner x 10 years  Other Topics Concern   Not on file  Social History Narrative   She works as a Sports coach.   Right-handed.   One cup caffeine per day.   She lives at home with daughter.  Social Determinants of Health   Financial Resource Strain: Not on file  Food Insecurity: Not on file  Transportation Needs: Not on file  Physical Activity: Not on file  Stress: Not on file  Social Connections: Not on file  Intimate Partner Violence: Not on file   Current Outpatient Medications on File Prior to Visit  Medication Sig Dispense Refill   amLODipine-valsartan (EXFORGE) 5-160 MG tablet Take 1 tablet by mouth every evening. 30 tablet 3   aspirin 81 MG chewable tablet Chew 1 tablet (81 mg total) by mouth daily.     atorvastatin (LIPITOR) 40 MG tablet Take 1 tablet (40 mg total) by mouth daily. 90 tablet 3   buPROPion (WELLBUTRIN SR) 150 MG 12 hr tablet Take 1 tablet (150 mg total) by mouth 2 (two) times daily at 10 am and 4 pm. Start tablet daily for 3 days (Patient not taking: Reported on 09/26/2021) 60 tablet 2   cetirizine (ZYRTEC ALLERGY) 10 MG tablet Take 1 tablet (10 mg total) by mouth daily. 30 tablet 0   clindamycin (CLEOCIN) 300 MG capsule Take 300 mg by mouth every 6 (six) hours.     Continuous Blood Gluc Receiver (FREESTYLE LIBRE 2 READER) DEVI 1 each by Does not apply route daily. (Patient not taking: Reported on 11/10/2021) 1 each 0   Continuous Blood Gluc Sensor (FREESTYLE LIBRE 2 SENSOR) MISC 1 each by Does not apply route every 14 (fourteen) days. (Patient not taking: Reported on 11/10/2021) 6 each 3   ezetimibe (ZETIA) 10 MG tablet Take 1 tablet (10 mg total) by mouth daily after supper. 90 tablet 3   gabapentin (NEURONTIN)  600 MG tablet Take 600 mg by mouth 3 (three) times daily.     glucose blood (BAYER CONTOUR TEST) test strip TEST BLOOD SUGARS 3 TIMES DAILY 200 each 11   Lancet Devices (EASY TOUCH LANCING DEVICE) MISC Use to check diabetes 4 times daily  E 11.65 1 each 11   latanoprost (XALATAN) 0.005 % ophthalmic solution Place 1 drop into both eyes at bedtime.     metFORMIN (GLUCOPHAGE) 1000 MG tablet Take 1 tablet (1,000 mg total) by mouth 2 (two) times daily with a meal. 180 tablet 3   metoprolol succinate (TOPROL-XL) 50 MG 24 hr tablet TAKE 1 TABLET BY MOUTH ONCE DAILY. TAKE WITH OR IMMEDIATELY FOLLOWING A MEAL 30 tablet 3   Multiple Vitamin (MULTIVITAMIN) tablet Take 1 tablet by mouth daily.     naloxone (NARCAN) nasal spray 4 mg/0.1 mL Place 1 spray into the nose as needed.     NOVOLIN N 100 UNIT/ML injection INJECT 20 TO 55 UNITS SUBCUTANEOUSLY TWICE DAILY BEFORE A MEAL 40 mL 0   NOVOLIN R 100 UNIT/ML injection INJECT 10 TO 25 UNITS SUBCUTANEOUSLY THREE TIMES DAILY BEFORE MEALS 70 mL 3   nystatin (NYSTATIN) powder Apply powder to affected area twice daily until rash has resolved 15 g 3   ondansetron (ZOFRAN ODT) 4 MG disintegrating tablet '4mg'$  ODT q4 hours prn nausea/vomit 20 tablet 0   oxyCODONE-acetaminophen (PERCOCET) 7.5-325 MG tablet Take 1 tablet by mouth 4 (four) times daily as needed.     pantoprazole (PROTONIX) 40 MG tablet Take 40 mg by mouth daily.     tiZANidine (ZANAFLEX) 4 MG tablet Take 4 mg by mouth 3 (three) times daily.     vitamin B-12 (CYANOCOBALAMIN) 1000 MCG tablet Take 1,000 mcg by mouth daily.     No current facility-administered medications on file prior to visit.  Allergies  Allergen Reactions   Oxycodone-Acetaminophen Other (See Comments)    Causes her to feel "hot".   Penicillins Rash    Did it involve swelling of the face/tongue/throat, SOB, or low BP? No Did it involve sudden or severe rash/hives, skin peeling, or any reaction on the inside of your mouth or nose?  No Did you need to seek medical attention at a hospital or doctor's office? No When did it last happen?       If all above answers are "NO", may proceed with cephalosporin use.   Family History  Problem Relation Age of Onset   Hypertension Mother    Diabetes Mother    Heart attack Mother    Stroke Father    Diabetes Father    Diabetes Maternal Grandmother    Hypertension Maternal Grandmother    Heart attack Sister    PE: BP 120/80 (BP Location: Right Arm, Patient Position: Sitting, Cuff Size: Normal)   Pulse (!) 103   Ht '5\' 4"'$  (1.626 m)   Wt 183 lb (83 kg)   SpO2 99%   BMI 31.41 kg/m    Wt Readings from Last 3 Encounters:  04/10/22 183 lb (83 kg)  11/10/21 184 lb 9.6 oz (83.7 kg)  09/26/21 183 lb 9.6 oz (83.3 kg)   Constitutional: overweight, in NAD Eyes: PERRLA, EOMI, no exophthalmos ENT: moist mucous membranes, no thyromegaly, no cervical lymphadenopathy Cardiovascular: Tachycardia, RR, No MRG Respiratory: CTA B Musculoskeletal: no deformities, strength intact in all 4 Skin: moist, warm, no rashes Neurological: no tremor with outstretched hands, DTR normal in all 4  ASSESSMENT: 1. DM2, insulin-dependent, uncontrolled, with complications - peripheral neuropathy - DR   2. Obesity class 1 - BMI 32.79  BMI Classification: < 18.5 underweight  18.5-24.9 normal weight  25.0-29.9 overweight  30.0-34.9 class I obesity  35.0-39.9 class II obesity  ? 40.0 class III obesity   3. HL  PLAN:  1. Patient with longstanding, uncontrolled, type 2 diabetes, with improved control more recently after she started to take her insulin consistently.  She has a history of an NSTEMI in 10/2019 with 3 subsequent stents and had cardiac rehab afterwards.  Sugars started to improve and HbA1c decreased to 6.4% following her AMI.  However, at last visit, sugars are variable and she did not bring a log or meter and could not remember the values well.  I was reticent to make changes in her  regimen but we discussed about varying the dose of regular insulin before the meal and I also advised her how to take this correctly.  She had problems obtaining the insulin from the pharmacy and I advised her to try to get them from [facility], as she could get them without prescription, and they were for her.  I also advised her to start writing blood sugars down.  Of note, the CGM was not approved for her in the past. -At today's visit, reviewing her meter, she has alternating low and high blood sugars.  The lowest blood sugar was a 35 and she feels that this happened after she took insulin and did not eat.  I discussed that she cannot do this.  However, I also advised her to decrease the insulin before lunch if she is very active after thismeal to avoid further low blood sugars since she also had some blood sugars in the 50s postprandially. -At today's visit, however, I attached a freestyle libre CGM and downloaded the freestyle libre 3 application on her  phone.  I also sent a prescription for this to her pharmacy.  She definitely qualifies for this especially with her history of low blood sugars and on multiple doses of insulin a day. -We also discussed about trying to switch to [place] from NPH and regular insulin.  I am hoping that these are covered by her insurance. - I advised her to: Patient Instructions  Please continue: - Metformin 1000 mg 2x a day   Insulin Before breakfast Before lunch Before dinner  R 10 10 (6 units if you are active after the meal) 20-25  NPH 15 - 20     Try to change from NPH and regular to: - Tresiba U200 30 units daily - FiAsp 6-10 units before b'fast and lunch and 18-20 units before dinner  Please return in 3 months with your sugar log.  - we checked her HbA1c: 6.5% (lower) - advised to check sugars at different times of the day - 4x a day, rotating check times - advised for yearly eye exams >> she is UTD - she has labs coming up with PCP - return  to clinic in 3 months  2. Obesity class 1 -We tried Ozempic in the past but she had to come off due to nausea/vomiting/abdominal pain and a high lipase in 05/2021 -She lost 8 pounds before last visit -At today's visit, weight is stable  3. HL -Reviewed latest lipid panel from 12/2020: Fractions at goal: Lab Results  Component Value Date   CHOL 124 01/24/2021   HDL 51 01/24/2021   LDLCALC 55 01/24/2021   LDLDIRECT 94.0 09/25/2015   TRIG 99 01/24/2021   CHOLHDL 2.4 01/24/2021  -She continues on Lipitor 40 mg daily and Zetia 10 mg daily without side effects  Philemon Kingdom, MD PhD [facility] Endocrinology

## 2022-04-20 ENCOUNTER — Encounter (HOSPITAL_COMMUNITY): Payer: Self-pay | Admitting: Emergency Medicine

## 2022-04-20 ENCOUNTER — Ambulatory Visit (HOSPITAL_COMMUNITY)
Admission: EM | Admit: 2022-04-20 | Discharge: 2022-04-20 | Disposition: A | Discharge status: Home / Self Care | Payer: BC Managed Care – PPO | Attending: Internal Medicine | Admitting: Internal Medicine

## 2022-04-20 DIAGNOSIS — B029 Zoster without complications: Secondary | ICD-10-CM | POA: Diagnosis not present

## 2022-04-20 MED ORDER — IBUPROFEN 600 MG PO TABS: 600.0000 mg | ORAL_TABLET | Freq: Four times a day (QID) | ORAL | 0 refills | Status: DC | PRN

## 2022-04-20 MED ORDER — METHOCARBAMOL 500 MG PO TABS: 500.0000 mg | ORAL_TABLET | Freq: Every evening | ORAL | 0 refills | Status: DC | PRN

## 2022-04-20 MED ORDER — VALACYCLOVIR HCL 1 G PO TABS
1000.0000 mg | ORAL_TABLET | Freq: Three times a day (TID) | ORAL | 0 refills | Status: AC
Start: 2022-04-20 — End: 2022-04-27

## 2022-04-20 MED ORDER — LIDOCAINE 5 % EX OINT: 1.0000 "application " | TOPICAL_OINTMENT | CUTANEOUS | 0 refills | Status: DC | PRN

## 2022-04-20 NOTE — ED Triage Notes (Signed)
Pt reports back pain. States she has been having back pain for "awhile" but pain intensified on Friday. States pain is worse on the right side today.

## 2022-04-20 NOTE — Discharge Instructions (Signed)
Please take medication as prescribed Apply cream as directed Please take ibuprofen as needed for pain and/or fever Please use muscle relaxants as needed only at bedtime.  Do not drive or operate heavy machinery while was on muscle relaxants because they make you drowsy and will make you prone to accidents. Return to urgent care if symptoms worsen.

## 2022-04-22 NOTE — ED Provider Notes (Signed)
[place]    CSN: [id number] Arrival date & time: 04/20/22  1962      History   Chief Complaint Chief Complaint  Patient presents with   Back Pain    HPI [name] is a 61 y.o. female comes to the urgent care with a few days history of achy right upper back pain followed by burning sensation in the right upper back.  Patient noticed an itchy red patch over the right upper back.  She is also noticed some rash.  His skin has become very sensitive and painful to touch.  He has a history of chickenpox as a child but denies any history of shingles.  She recently received the shingles immunization, second vaccination.  No fever or chills.  No trauma to the back.  HPI  Past Medical History:  Diagnosis Date   Arthritis    Campylobacter enteritis May 2012   [facility] admission   Carpal tunnel syndrome, bilateral    Diabetes mellitus    Gastritis with bleeding due to alcohol 2010   [facility] admission   GERD (gastroesophageal reflux disease)    Hyperlipidemia    Hypertension    no meds   Menopause    Myocardial infarction [facility])    Neck pain    Wears glasses     Patient Active Problem List   Diagnosis Date Noted   De Quervain's tenosynovitis, left 11/13/2021   Bilateral injections given May 21, 2020 degenerative arthritis of knee, bilateral 05/21/2020   Traumatic hematoma of female breast 11/29/2019   Dizziness 11/29/2019   Hospital discharge follow-up 10/23/2019   NSTEMI (non-ST elevated myocardial infarction) ([place]) 10/10/2019   CAD (coronary artery disease) 10/10/2019   Chest pain 10/09/2019   Elevated troponin 10/09/2019   Uncontrolled type 2 diabetes mellitus with hyperglycemia ([place]) 10/09/2019   Obesity (BMI 30.0-34.9) 10/09/2019   Cervical spondylitis with radiculitis ([place]) 08/29/2019   Acute bursitis of left shoulder 01/30/2019   Chronic midline posterior neck pain 06/28/2018   Urgency incontinence 06/28/2018   Degenerative joint disease of knee, left  04/26/2018   Carpal tunnel syndrome on both sides 03/29/2018   Anemia 01/02/2018   Class 1 obesity with serious comorbidity and body mass index (BMI) of 32.0 to 32.9 in adult 07/23/2017   Left hip pain 05/04/2017   Carpal tunnel syndrome on right 12/27/2016   Polyarthralgia 11/09/2016   Uncontrolled type 2 diabetes mellitus with retinopathy, with long-term current use of insulin 07/07/2016   Visit for preventive health examination 06/21/2016   Increased urinary frequency 06/21/2016   Chronic meniscal tear of knee 08/19/2015   Left knee pain 06/19/2015   Candidiasis of skin 06/18/2015   Genital herpes 07/31/2014   Ulnar nerve compression 09/21/2013   Noncompliance with diet and medication regimen 11/15/2012   Boil, axilla 08/14/2012   Arthritis    Screening for breast cancer 08/04/2011   Screening for colon cancer 08/04/2011   HIDRADENITIS SUPPURATIVA 04/11/2007   GERD 11/17/2006   ENDOMETRIAL POLYP 11/17/2006   LOW BACK PAIN 11/17/2006   FIBROIDS, UTERUS 11/16/2006   Hyperlipidemia associated with type 2 diabetes mellitus ([place]) 11/16/2006   Essential hypertension 11/16/2006    Past Surgical History:  Procedure Laterality Date   CARPAL TUNNEL RELEASE Left 05/14/2014   Procedure: LEFT ULNAR NEUROPLASTY AT ELBOW AND ENDOSCOPIC CARPAL TUNNEL RELEASE;  Surgeon: Jolyn Nap, MD;  Location: [facility];  Service: Orthopedics;  Laterality: Left;   CORONARY STENT INTERVENTION N/A 10/09/2019  Procedure: CORONARY STENT INTERVENTION;  Surgeon: Adrian Prows, MD;  Location: [place] [facility];  Service: Cardiovascular;  Laterality: N/A;   CYST EXCISION  1995   under arms   endometrial biopsy  2004   benign   FOOT OSTEOTOMY  2010   right-spurs   LEFT HEART CATH AND CORONARY ANGIOGRAPHY N/A 10/09/2019   Procedure: LEFT HEART CATH AND CORONARY ANGIOGRAPHY;  Surgeon: Adrian Prows, MD;  Location: [place] [facility];  Service: Cardiovascular;  Laterality: N/A;   TONSILLECTOMY      ULNAR NERVE TRANSPOSITION Left 05/14/2014   Procedure: ULNAR NERVE DECOMPRESSION/TRANSPOSITION;  Surgeon: Jolyn Nap, MD;  Location: [facility];  Service: Orthopedics;  Laterality: Left;    OB History     Gravida      Para      Term      Preterm      AB      Living  2      SAB      IAB      Ectopic      Multiple      Live Births               Home Medications    Prior to Admission medications   Medication Sig Start Date End Date Taking? Authorizing Provider  ibuprofen (ADVIL) 600 MG tablet Take 1 tablet (600 mg total) by mouth every 6 (six) hours as needed. 04/20/22  Yes Paulena Servais, Myrene Galas, MD  lidocaine (XYLOCAINE) 5 % ointment Apply 1 application. topically as needed. 04/20/22  Yes Zykeriah Mathia, Myrene Galas, MD  methocarbamol (ROBAXIN) 500 MG tablet Take 1 tablet (500 mg total) by mouth at bedtime as needed for muscle spasms. 04/20/22  Yes Brennan Karam, Myrene Galas, MD  valACYclovir (VALTREX) 1000 MG tablet Take 1 tablet (1,000 mg total) by mouth 3 (three) times daily for 7 days. 04/20/22 04/27/22 Yes Carigan Lister, Myrene Galas, MD  amLODipine-valsartan Vivi Ferns) 5-160 MG tablet Take 1 tablet by mouth every evening. 01/23/21   Adrian Prows, MD  aspirin 81 MG chewable tablet Chew 1 tablet (81 mg total) by mouth daily. 10/10/19   Black, Lezlie Octave, NP  atorvastatin (LIPITOR) 40 MG tablet Take 1 tablet (40 mg total) by mouth daily. 09/15/20   Philemon Kingdom, MD  cetirizine (ZYRTEC ALLERGY) 10 MG tablet Take 1 tablet (10 mg total) by mouth daily. 01/27/20 09/26/21  Darr, Edison Nasuti, PA-C  clindamycin (CLEOCIN) 300 MG capsule Take 300 mg by mouth every 6 (six) hours. 09/27/20   [provider]  Continuous Blood Gluc Sensor (FREESTYLE LIBRE 3 SENSOR) MISC 1 each by Does not apply route every 14 (fourteen) days. 04/10/22   Philemon Kingdom, MD  ezetimibe (ZETIA) 10 MG tablet Take 1 tablet (10 mg total) by mouth daily after supper. 09/15/20 09/26/21  Philemon Kingdom, MD   gabapentin (NEURONTIN) 600 MG tablet Take 600 mg by mouth 3 (three) times daily. 07/31/21   [provider]  glucose blood (BAYER CONTOUR TEST) test strip TEST BLOOD SUGARS 3 TIMES DAILY 08/12/16   Philemon Kingdom, MD  insulin aspart (FIASP FLEXTOUCH) 100 UNIT/ML FlexTouch Pen Inject 6-20 Units into the skin 3 (three) times daily before meals. 04/10/22   Philemon Kingdom, MD  insulin degludec (TRESIBA FLEXTOUCH) 200 UNIT/ML FlexTouch Pen Inject 30-34 Units into the skin daily. 04/10/22   Philemon Kingdom, MD  Insulin Pen Needle 32G X 4 MM MISC Use 4x a day 04/10/22   Philemon Kingdom, MD  Lancet Devices (EASY  TOUCH LANCING DEVICE) MISC Use to check diabetes 4 times daily  E 11.65 08/12/16   Philemon Kingdom, MD  latanoprost (XALATAN) 0.005 % ophthalmic solution Place 1 drop into both eyes at bedtime. 12/02/18   [provider]  metFORMIN (GLUCOPHAGE) 1000 MG tablet Take 1 tablet (1,000 mg total) by mouth 2 (two) times daily with a meal. 11/10/21   Philemon Kingdom, MD  metoprolol succinate (TOPROL-XL) 50 MG 24 hr tablet TAKE 1 TABLET BY MOUTH ONCE DAILY. TAKE WITH OR IMMEDIATELY FOLLOWING A MEAL 04/10/20   Adrian Prows, MD  Multiple Vitamin (MULTIVITAMIN) tablet Take 1 tablet by mouth daily.    [provider]  naloxone [facility]) nasal spray 4 mg/0.1 mL Place 1 spray into the nose as needed. 12/09/20   [provider]  NOVOLIN N 100 UNIT/ML injection INJECT 20 TO 55 UNITS SUBCUTANEOUSLY TWICE DAILY BEFORE A MEAL 03/24/22   Philemon Kingdom, MD  NOVOLIN R 100 UNIT/ML injection INJECT 10 TO 25 UNITS SUBCUTANEOUSLY THREE TIMES DAILY BEFORE MEALS 11/10/21   Philemon Kingdom, MD  nystatin (NYSTATIN) powder Apply powder to affected area twice daily until rash has resolved 11/27/19   Crecencio Mc, MD  ondansetron (ZOFRAN ODT) 4 MG disintegrating tablet '4mg'$  ODT q4 hours prn nausea/vomit 06/28/21   Deno Etienne, DO  oxyCODONE-acetaminophen (PERCOCET) 7.5-325 MG tablet Take 1  tablet by mouth 4 (four) times daily as needed. 10/07/19   [provider]  pantoprazole (PROTONIX) 40 MG tablet Take 40 mg by mouth daily. 09/02/21   [provider]  tiZANidine (ZANAFLEX) 4 MG tablet Take 4 mg by mouth 3 (three) times daily. 10/21/19   [provider]  vitamin B-12 (CYANOCOBALAMIN) 1000 MCG tablet Take 1,000 mcg by mouth daily.    [provider]    Family History Family History  Problem Relation Age of Onset   Hypertension Mother    Diabetes Mother    Heart attack Mother    Stroke Father    Diabetes Father    Diabetes Maternal Grandmother    Hypertension Maternal Grandmother    Heart attack Sister     Social History Social History   Tobacco Use   Smoking status: Every Day    Types: Cigarettes   Smokeless tobacco: Never  Vaping Use   Vaping Use: Never used  Substance Use Topics   Alcohol use: Not Currently   Drug use: Yes    Types: Marijuana    Comment: sometimes     Allergies   Oxycodone-acetaminophen and Penicillins   Review of Systems Review of Systems  Cardiovascular: Negative.   Gastrointestinal: Negative.   Genitourinary: Negative.   Musculoskeletal:  Positive for myalgias.  Skin:  Positive for color change and rash.  Neurological: Negative.     Physical Exam Triage Vital Signs ED Triage Vitals [04/20/22 1105]  Enc Vitals Group     BP 139/89     Pulse Rate 82     Resp 18     Temp 98.1 F (36.7 C)     Temp Source Oral     SpO2 99 %     Weight      Height      Head Circumference      Peak Flow      Pain Score 10     Pain Loc      Pain Edu?      Excl. in [place]?    No data found.  Updated Vital Signs BP 139/89 (BP Location:  Right Arm)   Pulse 82   Temp 98.1 F (36.7 C) (Oral)   Resp 18   SpO2 99%   Visual Acuity Right Eye Distance:   Left Eye Distance:   Bilateral Distance:    Right Eye Near:   Left Eye Near:    Bilateral Near:     Physical Exam Vitals and nursing note  reviewed.  Constitutional:      General: She is not in acute distress.    Appearance: She is not ill-appearing.  Musculoskeletal:     Comments: Well-circumscribed erythematous patch with few papular rash.  Circumscribed area is confined to a dermatome on the right upper back.  Erythematous area is sensitive to touch.  No discharge.  Neurological:     Mental Status: She is alert.     UC Treatments / Results  Labs (all labs ordered are listed, but only abnormal results are displayed) Labs Reviewed - No data to display  EKG   Radiology No results found.  Procedures Procedures (including critical care time)  Medications Ordered in UC Medications - No data to display  Initial Impression / Assessment and Plan / UC Course  I have reviewed the triage vital signs and the nursing notes.  Pertinent labs & imaging results that were available during my care of the patient were reviewed by me and considered in my medical decision making (see chart for details).     1.  Herpes zoster without complication: Ibuprofen as needed for pain Lidocaine ointment to be applied to the erythematous area as needed Valacyclovir 1000 mg 3 times daily for 7 days Robaxin 500 mg at bedtime as needed-precautions given Return precautions given  Final Clinical Impressions(s) / UC Diagnoses   Final diagnoses:  Herpes zoster without complication     Discharge Instructions      Please take medication as prescribed Apply cream as directed Please take ibuprofen as needed for pain and/or fever Please use muscle relaxants as needed only at bedtime.  Do not drive or operate heavy machinery while was on muscle relaxants because they make you drowsy and will make you prone to accidents. Return to urgent care if symptoms worsen.   ED Prescriptions     Medication Sig Dispense Auth. Provider   valACYclovir (VALTREX) 1000 MG tablet Take 1 tablet (1,000 mg total) by mouth 3 (three) times daily for 7 days.  21 tablet Cilicia Borden, Myrene Galas, MD   ibuprofen (ADVIL) 600 MG tablet Take 1 tablet (600 mg total) by mouth every 6 (six) hours as needed. 30 tablet Romain Erion, Myrene Galas, MD   lidocaine (XYLOCAINE) 5 % ointment Apply 1 application. topically as needed. 35.44 g Chase Picket, MD   methocarbamol (ROBAXIN) 500 MG tablet Take 1 tablet (500 mg total) by mouth at bedtime as needed for muscle spasms. 10 tablet Lekita Kerekes, Myrene Galas, MD      PDMP not reviewed this encounter.   Chase Picket, MD 04/22/22 [phone]

## 2022-04-28 ENCOUNTER — Ambulatory Visit (INDEPENDENT_AMBULATORY_CARE_PROVIDER_SITE_OTHER): Payer: BC Managed Care – PPO | Admitting: Podiatry

## 2022-04-28 ENCOUNTER — Encounter: Payer: Self-pay | Admitting: Podiatry

## 2022-04-28 DIAGNOSIS — B351 Tinea unguium: Secondary | ICD-10-CM | POA: Diagnosis not present

## 2022-04-28 DIAGNOSIS — E1140 Type 2 diabetes mellitus with diabetic neuropathy, unspecified: Secondary | ICD-10-CM | POA: Insufficient documentation

## 2022-04-28 DIAGNOSIS — M79674 Pain in right toe(s): Secondary | ICD-10-CM | POA: Diagnosis not present

## 2022-04-28 DIAGNOSIS — M79675 Pain in left toe(s): Secondary | ICD-10-CM

## 2022-04-28 NOTE — Progress Notes (Signed)
This patient returns to my office for at risk foot care.  This patient requires this care by a professional since this patient will be at risk due to having diabetic neuropathy.  This patient is unable to cut nails herself since the patient cannot reach hernails.These nails are painful walking and wearing shoes.  This patient presents for at risk foot care today.  General Appearance  Alert, conversant and in no acute stress.  Vascular  Dorsalis pedis and posterior tibial  pulses are palpable  bilaterally.  Capillary return is within normal limits  bilaterally. Temperature is within normal limits  bilaterally.  Neurologic  Senn-Weinstein monofilament wire test diminished   bilaterally. Muscle power within normal limits bilaterally.  Nails Thick disfigured discolored nails with subungual debris  from hallux to fifth toes bilaterally. No evidence of bacterial infection or drainage bilaterally.  Orthopedic  No limitations of motion  feet .  No crepitus or effusions noted.  No bony pathology or digital deformities noted.  Skin  normotropic skin with no porokeratosis noted bilaterally.  No signs of infections or ulcers noted.     Onychomycosis  Pain in right toes  Pain in left toes  Consent was obtained for treatment procedures.   Mechanical debridement of nails 1-5  bilaterally performed with a nail nipper.  Filed with dremel without incident.    Return office visit   3 months                   Told patient to return for periodic foot care and evaluation due to potential at risk complications.   Falicity Sheets DPM   

## 2022-05-22 ENCOUNTER — Other Ambulatory Visit: Payer: Self-pay

## 2022-05-22 ENCOUNTER — Telehealth: Payer: Self-pay | Admitting: Internal Medicine

## 2022-05-22 DIAGNOSIS — E11319 Type 2 diabetes mellitus with unspecified diabetic retinopathy without macular edema: Secondary | ICD-10-CM

## 2022-05-22 MED ORDER — FREESTYLE LIBRE READER DEVI: 0 refills | Status: DC

## 2022-05-22 NOTE — Telephone Encounter (Signed)
Patient requesting  Free Style Libre 3 reader. Per patient her phone is not working with the sensors and she has going through 3 sensors trying to "make it work".  Has nothing to read sensor as of now.  Pharmacy is the [facility] on [address] back # [phone]

## 2022-05-22 NOTE — Telephone Encounter (Signed)
RX now sent to preferred pharmacy.  

## 2022-05-25 ENCOUNTER — Ambulatory Visit: Payer: BC Managed Care – PPO | Admitting: Podiatry

## 2022-05-26 ENCOUNTER — Other Ambulatory Visit: Payer: Self-pay | Admitting: Internal Medicine

## 2022-07-03 ENCOUNTER — Encounter: Payer: Self-pay | Admitting: Podiatry

## 2022-07-03 ENCOUNTER — Ambulatory Visit (INDEPENDENT_AMBULATORY_CARE_PROVIDER_SITE_OTHER): Payer: BC Managed Care – PPO | Admitting: Podiatry

## 2022-07-03 DIAGNOSIS — E1140 Type 2 diabetes mellitus with diabetic neuropathy, unspecified: Secondary | ICD-10-CM | POA: Diagnosis not present

## 2022-07-03 DIAGNOSIS — M79675 Pain in left toe(s): Secondary | ICD-10-CM

## 2022-07-03 DIAGNOSIS — B351 Tinea unguium: Secondary | ICD-10-CM

## 2022-07-03 DIAGNOSIS — M79674 Pain in right toe(s): Secondary | ICD-10-CM | POA: Diagnosis not present

## 2022-07-03 NOTE — Progress Notes (Signed)
This patient returns to my office for at risk foot care.  This patient requires this care by a professional since this patient will be at risk due to having diabetic neuropathy.  This patient is unable to cut nails herself since the patient cannot reach her nails.These nails are painful walking and wearing shoes.  This patient presents for at risk foot care today.  General Appearance  Alert, conversant and in no acute stress.  Vascular  Dorsalis pedis and posterior tibial  pulses are palpable  bilaterally.  Capillary return is within normal limits  bilaterally. Temperature is within normal limits  bilaterally.  Neurologic  Senn-Weinstein monofilament wire test diminished  bilaterally. Muscle power within normal limits bilaterally.  Nails Thick disfigured discolored nails with subungual debris  from hallux to fifth toes bilaterally. No evidence of bacterial infection or drainage bilaterally.  Orthopedic  No limitations of motion  feet .  No crepitus or effusions noted.  No bony pathology or digital deformities noted.  Skin  normotropic skin with no porokeratosis noted bilaterally.  No signs of infections or ulcers noted.     Onychomycosis  Pain in right toes  Pain in left toes  Consent was obtained for treatment procedures.   Mechanical debridement of nails 1-5  bilaterally performed with a nail nipper.  Filed with dremel without incident.    Return office visit   9 weeks                  Told patient to return for periodic foot care and evaluation due to potential at risk complications.   Jmari Pelc DPM   

## 2022-07-04 ENCOUNTER — Observation Stay (HOSPITAL_COMMUNITY)
Admission: EM | Admit: 2022-07-04 | Discharge: 2022-07-06 | Disposition: A | Discharge status: Home / Self Care | Payer: BC Managed Care – PPO | Attending: Internal Medicine | Admitting: Internal Medicine

## 2022-07-04 ENCOUNTER — Emergency Department (HOSPITAL_COMMUNITY): Payer: BC Managed Care – PPO

## 2022-07-04 ENCOUNTER — Other Ambulatory Visit: Payer: Self-pay

## 2022-07-04 ENCOUNTER — Encounter (HOSPITAL_COMMUNITY): Payer: Self-pay | Admitting: Emergency Medicine

## 2022-07-04 DIAGNOSIS — F17210 Nicotine dependence, cigarettes, uncomplicated: Secondary | ICD-10-CM | POA: Insufficient documentation

## 2022-07-04 DIAGNOSIS — I2510 Atherosclerotic heart disease of native coronary artery without angina pectoris: Secondary | ICD-10-CM | POA: Diagnosis present

## 2022-07-04 DIAGNOSIS — Z7982 Long term (current) use of aspirin: Secondary | ICD-10-CM | POA: Diagnosis not present

## 2022-07-04 DIAGNOSIS — E1110 Type 2 diabetes mellitus with ketoacidosis without coma: Secondary | ICD-10-CM | POA: Diagnosis not present

## 2022-07-04 DIAGNOSIS — I25118 Atherosclerotic heart disease of native coronary artery with other forms of angina pectoris: Secondary | ICD-10-CM | POA: Diagnosis not present

## 2022-07-04 DIAGNOSIS — E785 Hyperlipidemia, unspecified: Secondary | ICD-10-CM | POA: Diagnosis not present

## 2022-07-04 DIAGNOSIS — R112 Nausea with vomiting, unspecified: Secondary | ICD-10-CM | POA: Diagnosis present

## 2022-07-04 DIAGNOSIS — Z7984 Long term (current) use of oral hypoglycemic drugs: Secondary | ICD-10-CM | POA: Diagnosis not present

## 2022-07-04 DIAGNOSIS — E1140 Type 2 diabetes mellitus with diabetic neuropathy, unspecified: Secondary | ICD-10-CM | POA: Diagnosis present

## 2022-07-04 DIAGNOSIS — D62 Acute posthemorrhagic anemia: Secondary | ICD-10-CM | POA: Diagnosis not present

## 2022-07-04 DIAGNOSIS — E872 Acidosis, unspecified: Secondary | ICD-10-CM | POA: Diagnosis not present

## 2022-07-04 DIAGNOSIS — E1169 Type 2 diabetes mellitus with other specified complication: Secondary | ICD-10-CM | POA: Diagnosis not present

## 2022-07-04 DIAGNOSIS — E876 Hypokalemia: Secondary | ICD-10-CM | POA: Diagnosis not present

## 2022-07-04 DIAGNOSIS — Z955 Presence of coronary angioplasty implant and graft: Secondary | ICD-10-CM | POA: Diagnosis not present

## 2022-07-04 DIAGNOSIS — M546 Pain in thoracic spine: Secondary | ICD-10-CM | POA: Insufficient documentation

## 2022-07-04 DIAGNOSIS — Z794 Long term (current) use of insulin: Secondary | ICD-10-CM | POA: Insufficient documentation

## 2022-07-04 DIAGNOSIS — E66811 Obesity, class 1: Secondary | ICD-10-CM | POA: Diagnosis present

## 2022-07-04 DIAGNOSIS — E1165 Type 2 diabetes mellitus with hyperglycemia: Secondary | ICD-10-CM | POA: Insufficient documentation

## 2022-07-04 DIAGNOSIS — Z6833 Body mass index (BMI) 33.0-33.9, adult: Secondary | ICD-10-CM | POA: Insufficient documentation

## 2022-07-04 DIAGNOSIS — E669 Obesity, unspecified: Secondary | ICD-10-CM | POA: Insufficient documentation

## 2022-07-04 DIAGNOSIS — R109 Unspecified abdominal pain: Secondary | ICD-10-CM | POA: Diagnosis not present

## 2022-07-04 DIAGNOSIS — Z79899 Other long term (current) drug therapy: Secondary | ICD-10-CM | POA: Insufficient documentation

## 2022-07-04 DIAGNOSIS — I10 Essential (primary) hypertension: Secondary | ICD-10-CM | POA: Diagnosis not present

## 2022-07-04 DIAGNOSIS — K920 Hematemesis: Secondary | ICD-10-CM | POA: Diagnosis present

## 2022-07-04 LAB — COMPREHENSIVE METABOLIC PANEL
ALT: 25 U/L (ref 0–44)
AST: 22 U/L (ref 15–41)
Albumin: 4.8 g/dL (ref 3.5–5.0)
Alkaline Phosphatase: 108 U/L (ref 38–126)
Anion gap: 14 (ref 5–15)
BUN: 20 mg/dL (ref 8–23)
CO2: 25 mmol/L (ref 22–32)
Calcium: 10.2 mg/dL (ref 8.9–10.3)
Chloride: 107 mmol/L (ref 98–111)
Creatinine, Ser: 0.62 mg/dL (ref 0.44–1.00)
GFR, Estimated: >60 mL/min (ref >60)
Glucose, Bld: 196 mg/dL — ABNORMAL HIGH (ref 70–99)
Potassium: 3.4 mmol/L — ABNORMAL LOW (ref 3.5–5.1)
Sodium: 146 mmol/L — ABNORMAL HIGH (ref 135–145)
Total Bilirubin: 0.5 mg/dL (ref 0.3–1.2)
Total Protein: 9.1 g/dL — ABNORMAL HIGH (ref 6.5–8.1)

## 2022-07-04 LAB — CBC WITH DIFFERENTIAL/PLATELET
Abs Immature Granulocytes: 0.05 10*3/uL (ref 0.00–0.07)
Abs Immature Granulocytes: 0.06 10*3/uL (ref 0.00–0.07)
Basophils Absolute: 0.1 10*3/uL (ref 0.0–0.1)
Basophils Absolute: 0 10*3/uL (ref 0.0–0.1)
Basophils Relative: 1 %
Basophils Relative: 0 %
Eosinophils Absolute: 0.1 10*3/uL (ref 0.0–0.5)
Eosinophils Absolute: 0 10*3/uL (ref 0.0–0.5)
Eosinophils Relative: 1 %
Eosinophils Relative: 0 %
HCT: 43.1 % (ref 36.0–46.0)
HCT: 46 % (ref 36.0–46.0)
Hemoglobin: 14.4 g/dL (ref 12.0–15.0)
Hemoglobin: 14.5 g/dL (ref 12.0–15.0)
Immature Granulocytes: 0 %
Immature Granulocytes: 0 %
Lymphocytes Relative: 20 %
Lymphocytes Relative: 6 %
Lymphs Abs: 2.5 10*3/uL (ref 0.7–4.0)
Lymphs Abs: 1 10*3/uL (ref 0.7–4.0)
MCH: 30.3 pg (ref 26.0–34.0)
MCH: 29.4 pg (ref 26.0–34.0)
MCHC: 33.4 g/dL (ref 30.0–36.0)
MCHC: 31.5 g/dL (ref 30.0–36.0)
MCV: 90.5 fL (ref 80.0–100.0)
MCV: 93.1 fL (ref 80.0–100.0)
Monocytes Absolute: 0.5 10*3/uL (ref 0.1–1.0)
Monocytes Absolute: 0.2 10*3/uL (ref 0.1–1.0)
Monocytes Relative: 4 %
Monocytes Relative: 2 %
Neutro Abs: 9.1 10*3/uL — ABNORMAL HIGH (ref 1.7–7.7)
Neutro Abs: 14.1 10*3/uL — ABNORMAL HIGH (ref 1.7–7.7)
Neutrophils Relative %: 74 %
Neutrophils Relative %: 92 %
Platelets: 388 10*3/uL (ref 150–400)
Platelets: 356 10*3/uL (ref 150–400)
RBC: 4.76 MIL/uL (ref 3.87–5.11)
RBC: 4.94 MIL/uL (ref 3.87–5.11)
RDW: 12.7 % (ref 11.5–15.5)
RDW: 12.6 % (ref 11.5–15.5)
WBC: 12.4 10*3/uL — ABNORMAL HIGH (ref 4.0–10.5)
WBC: 15.4 10*3/uL — ABNORMAL HIGH (ref 4.0–10.5)
nRBC: 0 % (ref 0.0–0.2)
nRBC: 0 % (ref 0.0–0.2)

## 2022-07-04 LAB — RAPID URINE DRUG SCREEN, HOSP PERFORMED
Amphetamines: NOT DETECTED
Barbiturates: NOT DETECTED
Benzodiazepines: NOT DETECTED
Cocaine: NOT DETECTED
Opiates: NOT DETECTED
Tetrahydrocannabinol: POSITIVE — AB

## 2022-07-04 LAB — URINALYSIS, ROUTINE W REFLEX MICROSCOPIC
Bacteria, UA: NONE SEEN
Bilirubin Urine: NEGATIVE
Glucose, UA: >=500 mg/dL — AB
Hgb urine dipstick: NEGATIVE
Ketones, ur: 5 mg/dL — AB
Leukocytes,Ua: NEGATIVE
Nitrite: NEGATIVE
Protein, ur: NEGATIVE mg/dL
Specific Gravity, Urine: 1.018 (ref 1.005–1.030)
pH: 8 (ref 5.0–8.0)

## 2022-07-04 LAB — TSH: TSH: 1.019 u[IU]/mL (ref 0.350–4.500)

## 2022-07-04 LAB — TROPONIN I (HIGH SENSITIVITY)
Troponin I (High Sensitivity): 5 ng/L (ref <18)
Troponin I (High Sensitivity): 6 ng/L (ref <18)

## 2022-07-04 LAB — GLUCOSE, CAPILLARY: Glucose-Capillary: 190 mg/dL — ABNORMAL HIGH (ref 70–99)

## 2022-07-04 LAB — LACTIC ACID, PLASMA: Lactic Acid, Venous: 2.5 mmol/L (ref 0.5–1.9)

## 2022-07-04 LAB — PHOSPHORUS: Phosphorus: 3.8 mg/dL (ref 2.5–4.6)

## 2022-07-04 LAB — CBG MONITORING, ED: Glucose-Capillary: 159 mg/dL — ABNORMAL HIGH (ref 70–99)

## 2022-07-04 LAB — MAGNESIUM: Magnesium: 2.2 mg/dL (ref 1.7–2.4)

## 2022-07-04 LAB — CK: Total CK: 194 U/L (ref 38–234)

## 2022-07-04 LAB — LIPASE, BLOOD: Lipase: 45 U/L (ref 11–51)

## 2022-07-04 MED ORDER — BUPROPION HCL 100 MG PO TABS
100.0000 mg | ORAL_TABLET | Freq: Every day | ORAL | Status: DC
Start: 2022-07-04 — End: 2022-07-05
  Administered 2022-07-04 – 2022-07-05 (×2): 100 mg via ORAL
  Filled 2022-07-04 (×2): qty 1

## 2022-07-04 MED ORDER — POTASSIUM CHLORIDE 10 MEQ/100ML IV SOLN
10.0000 meq | INTRAVENOUS | Status: AC
  Administered 2022-07-04 (×2): 10 meq via INTRAVENOUS
  Filled 2022-07-04 (×2): qty 100

## 2022-07-04 MED ORDER — SODIUM CHLORIDE 0.9 % IV SOLN: INTRAVENOUS | Status: AC

## 2022-07-04 MED ORDER — PROCHLORPERAZINE EDISYLATE 10 MG/2ML IJ SOLN
10.0000 mg | Freq: Once | INTRAMUSCULAR | Status: AC
  Administered 2022-07-04: 10 mg via INTRAVENOUS
  Filled 2022-07-04: qty 2

## 2022-07-04 MED ORDER — METOPROLOL SUCCINATE ER 50 MG PO TB24
50.0000 mg | ORAL_TABLET | Freq: Every day | ORAL | Status: DC
  Administered 2022-07-04 – 2022-07-06 (×3): 50 mg via ORAL
  Filled 2022-07-04 (×3): qty 1

## 2022-07-04 MED ORDER — LACTATED RINGERS IV BOLUS
1000.0000 mL | Freq: Once | INTRAVENOUS | Status: AC
  Administered 2022-07-04: 1000 mL via INTRAVENOUS

## 2022-07-04 MED ORDER — HYDROMORPHONE HCL 1 MG/ML IJ SOLN
1.0000 mg | Freq: Once | INTRAMUSCULAR | Status: AC
  Administered 2022-07-04: 1 mg via INTRAVENOUS
  Filled 2022-07-04: qty 1

## 2022-07-04 MED ORDER — BISACODYL 10 MG RE SUPP: 10.0000 mg | Freq: Every day | RECTAL | Status: DC | PRN

## 2022-07-04 MED ORDER — OXYCODONE-ACETAMINOPHEN 7.5-325 MG PO TABS
1.0000 | ORAL_TABLET | Freq: Four times a day (QID) | ORAL | Status: DC | PRN
  Administered 2022-07-05 – 2022-07-06 (×2): 1 via ORAL
  Filled 2022-07-04 (×2): qty 1

## 2022-07-04 MED ORDER — AMLODIPINE BESYLATE-VALSARTAN 5-160 MG PO TABS: 1.0000 | ORAL_TABLET | Freq: Every evening | ORAL | Status: DC

## 2022-07-04 MED ORDER — ASPIRIN 81 MG PO CHEW
81.0000 mg | CHEWABLE_TABLET | Freq: Every day | ORAL | Status: DC
Start: 2022-07-04 — End: 2022-07-05
  Administered 2022-07-04: 81 mg via ORAL
  Filled 2022-07-04: qty 1

## 2022-07-04 MED ORDER — PANTOPRAZOLE SODIUM 40 MG IV SOLR: 40.0000 mg | Freq: Two times a day (BID) | INTRAVENOUS | Status: DC

## 2022-07-04 MED ORDER — ONDANSETRON HCL 4 MG/2ML IJ SOLN
4.0000 mg | Freq: Once | INTRAMUSCULAR | Status: AC
  Administered 2022-07-04: 4 mg via INTRAVENOUS
  Filled 2022-07-04: qty 2

## 2022-07-04 MED ORDER — METHOCARBAMOL 500 MG PO TABS
500.0000 mg | ORAL_TABLET | Freq: Every evening | ORAL | Status: DC | PRN
  Administered 2022-07-04: 500 mg via ORAL
  Filled 2022-07-04: qty 1

## 2022-07-04 MED ORDER — PANTOPRAZOLE 80MG IVPB - SIMPLE MED: 80.0000 mg | Freq: Once | INTRAVENOUS | Status: DC

## 2022-07-04 MED ORDER — SODIUM CHLORIDE 0.9 % IV BOLUS
1000.0000 mL | Freq: Once | INTRAVENOUS | Status: AC
  Administered 2022-07-04: 1000 mL via INTRAVENOUS

## 2022-07-04 MED ORDER — MORPHINE SULFATE (PF) 2 MG/ML IV SOLN
2.0000 mg | INTRAVENOUS | Status: DC | PRN
  Administered 2022-07-04 – 2022-07-06 (×6): 2 mg via INTRAVENOUS
  Filled 2022-07-04 (×6): qty 1

## 2022-07-04 MED ORDER — PANTOPRAZOLE SODIUM 40 MG IV SOLR
40.0000 mg | INTRAVENOUS | Status: AC
  Administered 2022-07-04: 40 mg via INTRAVENOUS
  Filled 2022-07-04: qty 10

## 2022-07-04 MED ORDER — POLYETHYLENE GLYCOL 3350 17 G PO PACK: 17.0000 g | PACK | Freq: Every day | ORAL | Status: DC | PRN

## 2022-07-04 MED ORDER — PANTOPRAZOLE SODIUM 40 MG PO TBEC: 40.0000 mg | DELAYED_RELEASE_TABLET | Freq: Every day | ORAL | Status: DC

## 2022-07-04 MED ORDER — PANTOPRAZOLE INFUSION (NEW) - SIMPLE MED
8.0000 mg/h | INTRAVENOUS | Status: DC
  Administered 2022-07-04 – 2022-07-06 (×4): 8 mg/h via INTRAVENOUS
  Filled 2022-07-04 (×2): qty 100
  Filled 2022-07-04 (×2): qty 80
  Filled 2022-07-04 (×3): qty 100

## 2022-07-04 MED ORDER — ACETAMINOPHEN 650 MG RE SUPP: 650.0000 mg | Freq: Four times a day (QID) | RECTAL | Status: DC | PRN

## 2022-07-04 MED ORDER — DICYCLOMINE HCL 20 MG PO TABS
20.0000 mg | ORAL_TABLET | Freq: Once | ORAL | Status: AC
  Administered 2022-07-04: 20 mg via ORAL
  Filled 2022-07-04: qty 1

## 2022-07-04 MED ORDER — INSULIN ASPART 100 UNIT/ML IJ SOLN
0.0000 [IU] | INTRAMUSCULAR | Status: DC
  Administered 2022-07-04 (×2): 2 [IU] via SUBCUTANEOUS
  Administered 2022-07-05 (×3): 1 [IU] via SUBCUTANEOUS
  Administered 2022-07-05: 3 [IU] via SUBCUTANEOUS
  Administered 2022-07-06 (×2): 2 [IU] via SUBCUTANEOUS
  Administered 2022-07-06: 1 [IU] via SUBCUTANEOUS
  Filled 2022-07-04: qty 0.09

## 2022-07-04 MED ORDER — DOCUSATE SODIUM 100 MG PO CAPS
100.0000 mg | ORAL_CAPSULE | Freq: Two times a day (BID) | ORAL | Status: DC
  Administered 2022-07-04 – 2022-07-06 (×4): 100 mg via ORAL
  Filled 2022-07-04 (×4): qty 1

## 2022-07-04 MED ORDER — IOHEXOL 350 MG/ML SOLN
100.0000 mL | Freq: Once | INTRAVENOUS | Status: AC | PRN
  Administered 2022-07-04: 100 mL via INTRAVENOUS

## 2022-07-04 MED ORDER — LATANOPROST 0.005 % OP SOLN
1.0000 [drp] | Freq: Every day | OPHTHALMIC | Status: DC
Start: 2022-07-04 — End: 2022-07-06
  Administered 2022-07-04: 1 [drp] via OPHTHALMIC
  Filled 2022-07-04: qty 2.5

## 2022-07-04 MED ORDER — SENNA 8.6 MG PO TABS
1.0000 | ORAL_TABLET | Freq: Two times a day (BID) | ORAL | Status: DC
  Administered 2022-07-04 – 2022-07-06 (×4): 8.6 mg via ORAL
  Filled 2022-07-04 (×4): qty 1

## 2022-07-04 MED ORDER — INSULIN GLARGINE-YFGN 100 UNIT/ML ~~LOC~~ SOLN
20.0000 [IU] | Freq: Every day | SUBCUTANEOUS | Status: DC
  Administered 2022-07-04 – 2022-07-05 (×2): 20 [IU] via SUBCUTANEOUS
  Filled 2022-07-04 (×3): qty 0.2

## 2022-07-04 MED ORDER — PANTOPRAZOLE SODIUM 40 MG IV SOLR
40.0000 mg | Freq: Once | INTRAVENOUS | Status: AC
  Administered 2022-07-04: 40 mg via INTRAVENOUS
  Filled 2022-07-04: qty 10

## 2022-07-04 MED ORDER — ACETAMINOPHEN 325 MG PO TABS
650.0000 mg | ORAL_TABLET | Freq: Four times a day (QID) | ORAL | Status: DC | PRN
  Administered 2022-07-05: 650 mg via ORAL
  Filled 2022-07-04: qty 2

## 2022-07-04 MED ORDER — METOCLOPRAMIDE HCL 5 MG/ML IJ SOLN
10.0000 mg | Freq: Once | INTRAMUSCULAR | Status: AC
  Administered 2022-07-04: 10 mg via INTRAVENOUS
  Filled 2022-07-04: qty 2

## 2022-07-04 MED ORDER — ATORVASTATIN CALCIUM 40 MG PO TABS
40.0000 mg | ORAL_TABLET | Freq: Every day | ORAL | Status: DC
  Administered 2022-07-04 – 2022-07-05 (×2): 40 mg via ORAL
  Filled 2022-07-04 (×2): qty 1

## 2022-07-04 MED ORDER — GABAPENTIN 300 MG PO CAPS
600.0000 mg | ORAL_CAPSULE | Freq: Three times a day (TID) | ORAL | Status: DC
  Administered 2022-07-04 – 2022-07-06 (×5): 600 mg via ORAL
  Filled 2022-07-04 (×5): qty 2

## 2022-07-04 NOTE — ED Notes (Signed)
Offered pt food and drink.  She states she would try Ginger Ale.  Pt drank a few sips and sat it down stating her back hurts.  Ask level and she states 10 on 1-10 scale.

## 2022-07-04 NOTE — H&P (Addendum)
MAELEY MATTON OZD:[id number] DOB: [birth year] DOA: 07/04/2022     PCP: Carylon Perches, NP   Outpatient Specialists:   CARDS:   Dr. Einar Gip   [facility] [place]  Patient arrived to ER on 07/04/22 at 1225 Referred by Attending Elgie Congo, MD   Patient coming from:    home Lives With family    Chief Complaint:   Chief Complaint  Patient presents with   Abdominal Pain   Emesis    HPI: IONIA SCHEY is a 61 y.o. female with medical history significant of  DM 2, HTN CAD HLD, marijuana abuse, history of NSTEMI last stents in 2020, history of Campylobacter since neuritis GERD    Presented with nausea and vomiting Patient with history of diabetes Has been having nausea vomiting for at least an hour.  She has not taken her blood pressure medications today and on arrival to emergency department BP 198/117 Also endorsing back pain 10 out of 10  Family gave ibuprofen After repeated vomiting she started to have some blood in the vomit Smokes Marijuana 3-4 blunts a day Does not drink ETOH  Reports she ate left over lasagna this AM and started to have back crmps folowed y N/V no Diarrhea  No sick contacts  No fever no chills   Regarding pertinent Chronic problems:   Hyperlipidemia -  on statins Lipitor (atorvastatin) , zetia Lipid Panel     Component Value Date/Time   CHOL 124 01/24/2021 1606   TRIG 99 01/24/2021 1606   HDL 51 01/24/2021 1606   CHOLHDL 2.4 01/24/2021 1606   CHOLHDL 4.4 10/09/2019 1000   VLDL 26 10/09/2019 1000   LDLCALC 55 01/24/2021 1606   LDLCALC 150 (H) 12/21/2018 1706   LDLDIRECT 94.0 09/25/2015 0956   LABVLDL 18 01/24/2021 1606    HTN on Exforge, toprol     CAD  - On Aspirin, statin, betablocker,                 -  followed by cardiology                - last cardiac cath 2020 sp DES       DM 2 -  Lab Results  Component Value Date   HGBA1C 6.5 (A) 04/10/2022   on insulin,     obesity-   BMI Readings from Last 1 Encounters:  07/04/22  33.30 kg/m     While in ER: Clinical Course as of 07/04/22 1919  Sat Jul 04, 2022  1613 Received signout from previous patient.  Presented with diffuse right flank and chest pain associate with nausea and vomiting inability to tolerate p.o.  Had a leukocytosis 12.4 with left shift.  Glucose 196, no anion gap and normal bicarbonate, no concern for DKA.  Mild hypokalemia 3.4.  Lipase within normal limits, no concern for pancreatitis.  CTA dissection scan negative and no acute intra-abdominal pathology explaining symptoms.  UA is pending as well as p.o. challenge. [VB]  2595 Reassessed patient, she is still having active emesis on exam when I reevaluated patient.  She has received multiple doses of medications without improvement. Ordered another zofran.  Will page for admission for inability to tolerate p.o. [VB]  1915 Discussed case with hospitalist will be down to evaluate patient when orders for admission.  She requests potassium repletion, ordered for 2 IV doses since patient unable to tolerate p.o. [VB]    Clinical Course User Index [VB] Elgie Congo,  MD     CTabd/pelvis -  nonacute  CTA chest -  nonacute,   no evidence of infiltrate  Following Medications were ordered in ER: Medications  amLODipine-valsartan (EXFORGE) 5-160 MG per tablet 1 tablet (has no administration in time range)  metoprolol succinate (TOPROL-XL) 24 hr tablet 50 mg (50 mg Oral Given 07/04/22 1655)  pantoprazole (PROTONIX) injection 40 mg (has no administration in time range)  lactated ringers bolus 1,000 mL (0 mLs Intravenous Stopped 07/04/22 1802)  ondansetron (ZOFRAN) injection 4 mg (4 mg Intravenous Given 07/04/22 1316)  HYDROmorphone (DILAUDID) injection 1 mg (1 mg Intravenous Given 07/04/22 1400)  iohexol (OMNIPAQUE) 350 MG/ML injection 100 mL (100 mLs Intravenous Contrast Given 07/04/22 1531)  dicyclomine (BENTYL) tablet 20 mg (20 mg Oral Given 07/04/22 1655)  metoCLOPramide (REGLAN) injection 10 mg (10 mg  Intravenous Given 07/04/22 1655)  ondansetron (ZOFRAN) injection 4 mg (4 mg Intravenous Given 07/04/22 1901)     ED Triage Vitals [07/04/22 1238]  Enc Vitals Group     BP (!) 198/117     Pulse Rate 92     Resp (!) 22     Temp 97.9 F (36.6 C)     Temp Source Oral     SpO2 100 %     Weight 194 lb (88 kg)     Height '5\' 4"'$  (1.626 m)     Head Circumference      Peak Flow      Pain Score 10     Pain Loc      Pain Edu?      Excl. in [place]?   UVOZ(36)@     _________________________________________ Significant initial  Findings: Abnormal Labs Reviewed  COMPREHENSIVE METABOLIC PANEL - Abnormal; Notable for the following components:      Result Value   Sodium 146 (*)    Potassium 3.4 (*)    Glucose, Bld 196 (*)    Total Protein 9.1 (*)    All other components within normal limits  RAPID URINE DRUG SCREEN, HOSP PERFORMED - Abnormal; Notable for the following components:   Tetrahydrocannabinol POSITIVE (*)    All other components within normal limits  CBC WITH DIFFERENTIAL/PLATELET - Abnormal; Notable for the following components:   WBC 12.4 (*)    Neutro Abs 9.1 (*)    All other components within normal limits  URINALYSIS, ROUTINE W REFLEX MICROSCOPIC - Abnormal; Notable for the following components:   Color, Urine STRAW (*)    Glucose, UA >=500 (*)    Ketones, ur 5 (*)    All other components within normal limits     _________________________ Troponin 5 -6 ECG: Ordered Personally reviewed and interpreted by me showing: HR : 84 Rhythm: Sinus rhythm QTC 461    The recent clinical data is shown below. Vitals:   07/04/22 1746 07/04/22 1747 07/04/22 1845 07/04/22 1900  BP:  (!) 165/79 (!) 182/81 (!) 189/85  Pulse:  75 88 92  Resp:  16  16  Temp: 98 F (36.7 C) 97.7 F (36.5 C)    TempSrc:  Oral    SpO2:   99% 98%  Weight:      Height:        WBC     Component Value Date/Time   WBC 12.4 (H) 07/04/2022 1353   LYMPHSABS 2.5 07/04/2022 1353   MONOABS 0.5 07/04/2022 1353    EOSABS 0.1 07/04/2022 1353   BASOSABS 0.1 07/04/2022 1353      UA   no  evidence of UTI     Urine analysis:    Component Value Date/Time   COLORURINE STRAW (A) 07/04/2022 1305   APPEARANCEUR CLEAR 07/04/2022 1305   LABSPEC 1.018 07/04/2022 1305   PHURINE 8.0 07/04/2022 1305   GLUCOSEU >=500 (A) 07/04/2022 1305   HGBUR NEGATIVE 07/04/2022 1305   BILIRUBINUR NEGATIVE 07/04/2022 1305   BILIRUBINUR negative 06/27/2018 1700   KETONESUR 5 (A) 07/04/2022 1305   PROTEINUR NEGATIVE 07/04/2022 1305   UROBILINOGEN 0.2 06/27/2018 1700   NITRITE NEGATIVE 07/04/2022 1305   LEUKOCYTESUR NEGATIVE 07/04/2022 1305      _______________________________________________ Hospitalist was called for admission for   Nausea and vomiting, unspecified vomiting type   Abdominal pain, unspecified abdominal location     The following Work up has been ordered so far:  Orders Placed This Encounter  Procedures   DG Chest Portable 1 View   CT Angio Chest/Abd/Pel for Dissection W and/or Wo Contrast   Magnesium   Lipase, blood   Comprehensive metabolic panel   Rapid urine drug screen (hospital performed)   CBC with Differential/Platelet   Urinalysis, Routine w reflex microscopic Urine, Clean Catch   Cardiac monitoring   Initiate Carrier Fluid Protocol   Fluid/PO Challenge   Patient may eat/drink   Consult to hospitalist   Pulse oximetry, continuous   CBG monitoring, ED   EKG 12-Lead   Insert peripheral IV     OTHER Significant initial  Findings:  labs showing:    Recent Labs  Lab 07/04/22 1353  NA 146*  K 3.4*  CO2 25  GLUCOSE 196*  BUN 20  CREATININE 0.62  CALCIUM 10.2  MG 2.2    Cr   stable,   Lab Results  Component Value Date   CREATININE 0.62 07/04/2022   CREATININE 0.79 06/28/2021   CREATININE 0.65 01/24/2021    Recent Labs  Lab 07/04/22 1353  AST 22  ALT 25  ALKPHOS 108  BILITOT 0.5  PROT 9.1*  ALBUMIN 4.8   Lab Results  Component Value Date   CALCIUM 10.2  07/04/2022        Plt: Lab Results  Component Value Date   PLT 388 07/04/2022      COVID-19 Labs  No results for input(s): "DDIMER", "FERRITIN", "LDH", "CRP" in the last 72 hours.  Lab Results  Component Value Date   [place] Not Detected 01/05/2020   [place] Not Detected 12/26/2019   [place] NEGATIVE 10/09/2019     Recent Labs  Lab 07/04/22 1353  WBC 12.4*  NEUTROABS 9.1*  HGB 14.4  HCT 43.1  MCV 90.5  PLT 388    HG/HCT   stable,      Component Value Date/Time   HGB 14.4 07/04/2022 1353   HGB 12.6 12/08/2019 0826   HCT 43.1 07/04/2022 1353   HCT 38.3 12/08/2019 0826   MCV 90.5 07/04/2022 1353   MCV 91 12/08/2019 0826     Recent Labs  Lab 07/04/22 1353  LIPASE 45   No results for input(s): "AMMONIA" in the last 168 hours.    Cardiac Panel (last 3 results) No results for input(s): "CKTOTAL", "CKMB", "TROPONINI", "RELINDX" in the last 72 hours.  .car BNP (last 3 results) No results for input(s): "BNP" in the last 8760 hours.    DM  labs:  HbA1C: Recent Labs    11/10/21 1436 04/10/22 1514  HGBA1C 7.7* 6.5*       CBG (last 3)  No results for input(s): "GLUCAP" in the last 72  hours.        Cultures: No results found for: "SDES", "SPECREQUEST", "CULT", "REPTSTATUS"   Radiological Exams on Admission: CT Angio Chest/Abd/Pel for Dissection W and/or Wo Contrast  Result Date: 07/04/2022 CLINICAL DATA:  Right-sided pain, nausea and vomiting for 1 hour EXAM: CT ANGIOGRAPHY CHEST, ABDOMEN AND PELVIS TECHNIQUE: Non-contrast CT of the chest was initially obtained. Multidetector CT imaging through the chest, abdomen and pelvis was performed using the standard protocol during bolus administration of intravenous contrast. Multiplanar reconstructed images and MIPs were obtained and reviewed to evaluate the vascular anatomy. RADIATION DOSE REDUCTION: This exam was performed according to the departmental dose-optimization program which includes  automated exposure control, adjustment of the mA and/or kV according to patient size and/or use of iterative reconstruction technique. CONTRAST:  177m OMNIPAQUE IOHEXOL 350 MG/ML SOLN COMPARISON:  07/04/2022, 06/28/2021 FINDINGS: CTA CHEST FINDINGS Cardiovascular: The thoracic aorta is unremarkable without evidence of aneurysm or dissection. Mild atherosclerosis of the aortic arch. The heart is unremarkable without pericardial effusion. Moderate coronary artery atherosclerosis. While not optimized for opacification of the pulmonary vasculature, there is sufficient contrast enhancement to exclude pulmonary emboli. No filling defects. Mediastinum/Nodes: No enlarged mediastinal, hilar, or axillary lymph nodes. Thyroid gland, trachea, and esophagus demonstrate no significant findings. Small hiatal hernia. Lungs/Pleura: No acute airspace disease, effusion, or pneumothorax. Central airways are widely patent. Musculoskeletal: No acute or destructive bony lesions. Reconstructed images demonstrate no additional findings. Review of the MIP images confirms the above findings. CTA ABDOMEN AND PELVIS FINDINGS VASCULAR Aorta: Normal caliber aorta without aneurysm, dissection, vasculitis or significant stenosis. Minimal atherosclerosis at the aortic bifurcation. Celiac: Patent without evidence of aneurysm, dissection, vasculitis or significant stenosis. SMA: Patent without evidence of aneurysm, dissection, vasculitis or significant stenosis. Incidental replaced right hepatic artery off the SMA, a frequent anatomic variant. Renals: Both renal arteries are patent without evidence of aneurysm, dissection, vasculitis, fibromuscular dysplasia or significant stenosis. IMA: Patent without evidence of aneurysm, dissection, vasculitis or significant stenosis. Inflow: Patent without evidence of aneurysm, dissection, vasculitis or significant stenosis. Veins: No obvious venous abnormality within the limitations of this arterial phase study.  Review of the MIP images confirms the above findings. NON-VASCULAR Hepatobiliary: No focal liver abnormality is seen. No gallstones, gallbladder wall thickening, or biliary dilatation. Pancreas: Unremarkable. No pancreatic ductal dilatation or surrounding inflammatory changes. Spleen: Normal in size without focal abnormality. Adrenals/Urinary Tract: Adrenal glands are stable. Kidneys are normal, without renal calculi, focal lesion, or hydronephrosis. Bladder is unremarkable. Stomach/Bowel: No bowel obstruction or ileus. Normal appendix right lower quadrant. No bowel wall thickening or inflammatory change. Lymphatic: No pathologic adenopathy within the abdomen or pelvis. Reproductive: Uterus and bilateral adnexa are unremarkable. Other: No free fluid or free intraperitoneal gas. No abdominal wall hernia. Musculoskeletal: No acute or destructive bony lesions. Reconstructed images demonstrate no additional findings. Review of the MIP images confirms the above findings. IMPRESSION: 1. No evidence of thoracoabdominal aortic aneurysm or dissection. 2. No evidence of pulmonary embolus. 3. No acute intrathoracic, intra-abdominal, or intrapelvic process. 4. Aortic Atherosclerosis (ICD10-I70.0). Coronary artery atherosclerosis. 5. Small hiatal hernia. Electronically Signed   By: MRanda NgoM.D.   On: 07/04/2022 15:52   DG Chest Portable 1 View  Result Date: 07/04/2022 CLINICAL DATA:  Right-sided chest pain. EXAM: PORTABLE CHEST 1 VIEW COMPARISON:  06/28/2021 FINDINGS: 1329 hours. The lungs are clear without focal pneumonia, edema, pneumothorax or pleural effusion. The cardiopericardial silhouette is within normal limits for size. The visualized bony structures of the thorax are  unremarkable. Telemetry leads overlie the chest. IMPRESSION: No active disease. Electronically Signed   By: Misty Stanley M.D.   On: 07/04/2022 14:08    _______________________________________________________________________________________________________ Latest   Blood pressure (!) 189/85, pulse 92, temperature 97.7 F (36.5 C), temperature source Oral, resp. rate 16, height '5\' 4"'$  (1.626 m), weight 88 kg, SpO2 98 %.   Vitals  labs and radiology finding personally reviewed  Review of Systems:    Pertinent positives include:   abdominal pain, nausea, vomiting,   Constitutional:  No weight loss, night sweats, Fevers, chills, fatigue, weight loss  HEENT:  No headaches, Difficulty swallowing,Tooth/dental problems,Sore throat,  No sneezing, itching, ear ache, nasal congestion, post nasal drip,  Cardio-vascular:  No chest pain, Orthopnea, PND, anasarca, dizziness, palpitations.no Bilateral lower extremity swelling  GI:  No heartburn, indigestion,diarrhea, change in bowel habits, loss of appetite, melena, blood in stool, hematemesis Resp:  no shortness of breath at rest. No dyspnea on exertion, No excess mucus, no productive cough, No non-productive cough, No coughing up of blood.No change in color of mucus.No wheezing. Skin:  no rash or lesions. No jaundice GU:  no dysuria, change in color of urine, no urgency or frequency. No straining to urinate.  No flank pain.  Musculoskeletal:  No joint pain or no joint swelling. No decreased range of motion. No back pain.  Psych:  No change in mood or affect. No depression or anxiety. No memory loss.  Neuro: no localizing neurological complaints, no tingling, no weakness, no double vision, no gait abnormality, no slurred speech, no confusion  All systems reviewed and apart from [place] all are negative _______________________________________________________________________________________________ Past Medical History:   Past Medical History:  Diagnosis Date   Arthritis    Campylobacter enteritis May 2012   [facility] admission   Carpal tunnel syndrome, bilateral    Diabetes mellitus    Gastritis  with bleeding due to alcohol 2010   [facility] admission   GERD (gastroesophageal reflux disease)    Hyperlipidemia    Hypertension    no meds   Menopause    Myocardial infarction ([place])    Neck pain    Wears glasses       Past Surgical History:  Procedure Laterality Date   CARPAL TUNNEL RELEASE Left 05/14/2014   Procedure: LEFT ULNAR NEUROPLASTY AT ELBOW AND ENDOSCOPIC CARPAL TUNNEL RELEASE;  Surgeon: Jolyn Nap, MD;  Location: [facility];  Service: Orthopedics;  Laterality: Left;   CORONARY STENT INTERVENTION N/A 10/09/2019   Procedure: CORONARY STENT INTERVENTION;  Surgeon: Adrian Prows, MD;  Location: [facility];  Service: Cardiovascular;  Laterality: N/A;   CYST EXCISION  1995   under arms   endometrial biopsy  2004   benign   FOOT OSTEOTOMY  2010   right-spurs   LEFT HEART CATH AND CORONARY ANGIOGRAPHY N/A 10/09/2019   Procedure: LEFT HEART CATH AND CORONARY ANGIOGRAPHY;  Surgeon: Adrian Prows, MD;  Location: [facility];  Service: Cardiovascular;  Laterality: N/A;   TONSILLECTOMY     ULNAR NERVE TRANSPOSITION Left 05/14/2014   Procedure: ULNAR NERVE DECOMPRESSION/TRANSPOSITION;  Surgeon: Jolyn Nap, MD;  Location: [facility];  Service: Orthopedics;  Laterality: Left;    Social History:  Ambulatory   cane,     reports that she has been smoking cigarettes. She has never used smokeless tobacco. She reports that she does not currently use alcohol. She reports current drug use. Drug: Marijuana.      Family History:  Family History  Problem Relation Age of Onset   Hypertension Mother    Diabetes Mother    Heart attack Mother    Stroke Father    Diabetes Father    Diabetes Maternal Grandmother    Hypertension Maternal Grandmother    Heart attack Sister    ______________________________________________________________________________________________ Allergies: Allergies  Allergen Reactions   Oxycodone-Acetaminophen  Other (See Comments)    Causes her to feel "hot".   Penicillins Rash    Did it involve swelling of the face/tongue/throat, SOB, or low BP? No Did it involve sudden or severe rash/hives, skin peeling, or any reaction on the inside of your mouth or nose? No Did you need to seek medical attention at a hospital or doctor's office? No When did it last happen?       If all above answers are "NO", may proceed with cephalosporin use.     Prior to Admission medications   Medication Sig Start Date End Date Taking? Authorizing Provider  aspirin 81 MG chewable tablet Chew 1 tablet (81 mg total) by mouth daily. 10/10/19  Yes Black, Lezlie Octave, NP  atorvastatin (LIPITOR) 40 MG tablet Take 1 tablet (40 mg total) by mouth daily. 09/15/20  Yes Philemon Kingdom, MD  buPROPion (WELLBUTRIN) 100 MG tablet Take 100 mg by mouth daily. 04/28/22  Yes [provider]  Continuous Blood Gluc Receiver (FREESTYLE LIBRE READER) DEVI Use as instructed to check blood sugars 05/22/22  Yes Philemon Kingdom, MD  Continuous Blood Gluc Sensor (FREESTYLE LIBRE 3 SENSOR) MISC 1 each by Does not apply route every 14 (fourteen) days. 04/10/22  Yes Philemon Kingdom, MD  FARXIGA 10 MG TABS tablet Take 10 mg by mouth daily. 06/26/22  Yes [provider]  gabapentin (NEURONTIN) 600 MG tablet Take 600 mg by mouth 3 (three) times daily. 07/31/21  Yes [provider]  glipiZIDE (GLUCOTROL) 10 MG tablet Take 10 mg by mouth daily. 06/26/22  Yes [provider]  glucose blood (BAYER CONTOUR TEST) test strip TEST BLOOD SUGARS 3 TIMES DAILY 08/12/16  Yes Philemon Kingdom, MD  hydrOXYzine (ATARAX) 25 MG tablet Take 25 mg by mouth 3 (three) times daily as needed. itching 06/29/22  Yes [provider]  ibuprofen (ADVIL) 600 MG tablet Take 1 tablet (600 mg total) by mouth every 6 (six) hours as needed. 04/20/22  Yes Lamptey, Myrene Galas, MD  Insulin Pen Needle 32G X 4 MM MISC Use 4x a day 04/10/22  Yes Philemon Kingdom, MD  Lancet Devices (EASY TOUCH LANCING DEVICE) MISC Use to check diabetes 4 times daily  E 11.65 08/12/16  Yes Philemon Kingdom, MD  latanoprost (XALATAN) 0.005 % ophthalmic solution Place 1 drop into both eyes at bedtime. 12/02/18  Yes [provider]  methocarbamol (ROBAXIN) 500 MG tablet Take 1 tablet (500 mg total) by mouth at bedtime as needed for muscle spasms. 04/20/22  Yes Lamptey, Myrene Galas, MD  metoprolol succinate (TOPROL-XL) 50 MG 24 hr tablet TAKE 1 TABLET BY MOUTH ONCE DAILY. TAKE WITH OR IMMEDIATELY FOLLOWING A MEAL Patient taking differently: Take 50 mg by mouth daily. 04/10/20  Yes Adrian Prows, MD  Multiple Vitamin (MULTIVITAMIN) tablet Take 1 tablet by mouth daily.   Yes [provider]  ondansetron (ZOFRAN ODT) 4 MG disintegrating tablet '4mg'$  ODT q4 hours prn nausea/vomit 06/28/21  Yes Deno Etienne, DO  oxyCODONE-acetaminophen (PERCOCET) 7.5-325 MG tablet Take 1 tablet by mouth 4 (four) times daily as needed. 10/07/19  Yes [provider]  OZEMPIC, 1 MG/DOSE, 4 MG/3ML SOPN Inject 1 mg into the skin once a week. Tuesday of each week 06/26/22  Yes [provider]  pantoprazole (PROTONIX) 40 MG tablet Take 40 mg by mouth daily. 09/02/21  Yes [provider]  solifenacin (VESICARE) 5 MG tablet Take 5 mg by mouth daily. 05/29/22  Yes [provider]  TRESIBA FLEXTOUCH 200 UNIT/ML FlexTouch Pen INJECT 30 TO 34 UNITS SUBCUTANEOUSLY ONCE DAILY Patient taking differently: 25 Units in the morning. 05/27/22  Yes Philemon Kingdom, MD  vitamin B-12 (CYANOCOBALAMIN) 1000 MCG tablet Take 1,000 mcg by mouth daily.   Yes [provider]  amLODipine-valsartan (EXFORGE) 5-160 MG tablet Take 1 tablet by mouth every evening. Patient not taking: Reported on 07/04/2022 01/23/21   Adrian Prows, MD  insulin aspart (FIASP FLEXTOUCH) 100 UNIT/ML FlexTouch Pen Inject 6-20 Units into the skin 3 (three) times daily before meals. Patient not taking:  Reported on 07/04/2022 04/10/22   Philemon Kingdom, MD  lidocaine (XYLOCAINE) 5 % ointment Apply 1 application. topically as needed. 04/20/22   Chase Picket, MD  metFORMIN (GLUCOPHAGE) 1000 MG tablet Take 1 tablet (1,000 mg total) by mouth 2 (two) times daily with a meal. Patient not taking: Reported on 07/04/2022 11/10/21   Philemon Kingdom, MD  naloxone [facility]) nasal spray 4 mg/0.1 mL Place 1 spray into the nose as needed. 12/09/20   [provider]  tiZANidine (ZANAFLEX) 4 MG tablet Take 4 mg by mouth 3 (three) times daily. Patient not taking: Reported on 07/04/2022 10/21/19   [provider]    ___________________________________________________________________________________________________ Physical Exam:    07/04/2022    7:00 PM 07/04/2022    6:45 PM 07/04/2022    5:47 PM  Vitals with BMI  Systolic [phone]  Diastolic 85 81 79  Pulse 92 88 75     1. General:  in No  Acute distress   Chronically ill   -appearing 2. Psychological: Alert and   Oriented 3. Head/ENT:   Dry Mucous Membranes                          Head Non traumatic, neck supple                          Poor Dentition 4. SKIN: decreased Skin turgor,  Skin clean Dry and intact no rash 5. Heart: Regular rate and rhythm no  Murmur, no Rub or gallop 6. Lungs  no wheezes or crackles   7. Abdomen: Soft,  non-tender, Non distended   obese  bowel sounds present 8. Lower extremities: no clubbing, cyanosis, no  edema 9. Neurologically Grossly intact, moving all 4 extremities equally  Producing slightly pink vomitus at the time  Chart has been reviewed  ______________________________________________________________________________________________  Assessment/Plan 61 y.o. female with medical history significant of  DM 2, HTN CAD HLD, marijuana abuse, history of NSTEMI last stents in 2020, history of Campylobacter since neuritis GERD   Admitted for   Nausea and vomiting, unspecified vomiting type       Present on Admission:  Intractable nausea and vomiting  Diabetic neuropathy, type II diabetes mellitus ([place])  CAD (coronary artery disease)  Hematemesis  Hyperlipidemia associated with type 2 diabetes mellitus ([place])  Obesity (BMI 30.0-34.9)  Uncontrolled type 2 diabetes mellitus with hyperglycemia ([facility])  Hypokalemia  Lactic acidosis     Diabetic neuropathy, type II diabetes mellitus ([place]) Order sliding scale  monitor blood sugars  CAD (coronary artery disease) Chronic stable continue Toprol 50 mg p.o. daily Hold aspirin given possibility of upper GI bleed.  Continue Lipitor 40 mg p.o. daily  Hematemesis In the setting of persistent nausea and vomiting.  Possibly Mallory-Weiss tear.  Patient did take some ibuprofen today has history of GERD.  Will make sure on Protonix drip CBC stable Sent message to [facility] who will see patient in consult.  Keep n.p.o. postmidnight Control nausea vomiting Follow serial CBC expect some drop in CBC given rehydration but if profound drop will need to transfuse we will transfuse for hemoglobin below 7  Hyperlipidemia associated with type 2 diabetes mellitus (HCC) Continue Lipitor 40 mg p.o. daily  Intractable nausea and vomiting Etiology unclear could be possibly secondary to gastroenteritis versus gastroparesis in the setting of marijuana abuse and diabetes. Counseled patient that marijuana can cause nausea and vomiting. CT abdomen unremarkable. Supportive management\ Will rehydrate  Obesity (BMI 30.0-34.9) Will need to follow-up as an outpatient with nutrition  Uncontrolled type 2 diabetes mellitus with hyperglycemia (HCC) Order sliding scale Lantus 20 units subcu nightly  Hypokalemia - will replace and repeat in AM,  check magnesium level and replace as needed   Lactic acidosis In the setting of dehydration CT abdomen  nonacute So far no infectious etiology.  We will continue to rehydrate and follow lactic acid   Other plan as  per orders.  DVT prophylaxis:  SCD       Code Status:    Code Status: Prior FULL CODE  as per patient   I had personally discussed CODE STATUS with patient and family    Family Communication:   Family at  Bedside  plan of care was discussed  with  Daughter   Disposition Plan:       To home once workup is complete and patient is stable   Following barriers for discharge:                            Electrolytes corrected                                                           Will need consultants to evaluate patient prior to discharge                      Would benefit from PT/OT eval prior to DC  Ordered                                       Diabetes care coordinator                   Consults called:  [facility]    Admission status:  ED Disposition     ED Disposition  Admit   Condition  --   [place]: [facility] [[id number]]  Level of Care: Telemetry [5]  Admit to tele based on following criteria: Other see comments  Comments: hypokalemia  May place patient in observation at [facility] or [place] if equivalent level of care is available:: No  Covid Evaluation: Asymptomatic - no recent exposure (  last 10 days) testing not required  Diagnosis: Intractable nausea and vomiting [845364]  Admitting Physician: Toy Baker [id number]]  Attending Physician: Toy Baker [[id number]]           Obs     Level of care     tele  For  24H    Askari Kinley 07/04/2022, 8:57 PM    [facility]     after 2 AM please page floor coverage PA If 7AM-7PM, please contact the day team taking care of the patient using [email or web address]   Patient was evaluated in the context of the global COVID-19 pandemic, which necessitated consideration that the patient might be at risk for infection with the SARS-CoV-2 virus that causes COVID-19. Institutional protocols and algorithms that pertain to the evaluation of patients at risk for COVID-19 are in a  state of rapid change based on information released by regulatory bodies including the CDC and federal and state organizations. These policies and algorithms were followed during the patient's care.

## 2022-07-04 NOTE — Subjective & Objective (Signed)
Patient with history of diabetes Has been having nausea vomiting for at least an hour.  She has not taken her blood pressure medications today and on arrival to emergency department BP 198/117 Also endorsing back pain 10 out of 10

## 2022-07-04 NOTE — Assessment & Plan Note (Signed)
In the setting of dehydration CT abdomen  nonacute So far no infectious etiology.  We will continue to rehydrate and follow lactic acid

## 2022-07-04 NOTE — Assessment & Plan Note (Signed)
Order sliding scale Lantus 20 units subcu nightly

## 2022-07-04 NOTE — ED Triage Notes (Signed)
Pt endorses right sided pain, n/v for an hour. Pt has not taken HTN meds today, BP 198/117, CBG 262 this morning.

## 2022-07-04 NOTE — Assessment & Plan Note (Signed)
Chronic stable continue Toprol 50 mg p.o. daily Hold aspirin given possibility of upper GI bleed.  Continue Lipitor 40 mg p.o. daily

## 2022-07-04 NOTE — Assessment & Plan Note (Signed)
Continue Lipitor 40 mg p.o. daily 

## 2022-07-04 NOTE — Assessment & Plan Note (Signed)
Will need to follow-up as an outpatient with nutrition

## 2022-07-04 NOTE — Assessment & Plan Note (Signed)
Etiology unclear could be possibly secondary to gastroenteritis versus gastroparesis in the setting of marijuana abuse and diabetes. Counseled patient that marijuana can cause nausea and vomiting. CT abdomen unremarkable. Supportive management\ Will rehydrate

## 2022-07-04 NOTE — Assessment & Plan Note (Signed)
Order sliding scale monitor blood sugars 

## 2022-07-04 NOTE — Assessment & Plan Note (Addendum)
In the setting of persistent nausea and vomiting.  Possibly Mallory-Weiss tear.  Patient did take some ibuprofen today has history of GERD.  Will make sure on Protonix drip CBC stable Sent message to [facility] who will see patient in consult.  Keep n.p.o. postmidnight Control nausea vomiting Follow serial CBC expect some drop in CBC given rehydration but if profound drop will need to transfuse we will transfuse for hemoglobin below 7

## 2022-07-04 NOTE — Assessment & Plan Note (Signed)
-   will replace and repeat in AM,  check magnesium level and replace as needed ° °

## 2022-07-04 NOTE — ED Provider Notes (Signed)
[facility] DEPT Provider Note   CSN: [id number] Arrival date & time: 07/04/22  1225     History  Chief Complaint  Patient presents with   Abdominal Pain   Emesis    [name] is a 61 y.o. female.   Abdominal Pain Associated symptoms: chest pain, nausea and vomiting   Emesis Associated symptoms: abdominal pain   Patient presents for right flank pain, nausea, and vomiting.  Medical history includes HTN, GERD, T2DM, CAD, HLD.  Patient reports that she was in her normal state of health earlier today.  Shortly prior to arrival, she had the acute onset of right flank pain, nausea, and vomiting.  Pain radiates throughout her right side, including chest wall and posterior shoulder.  Symptoms have been severe.  She has had recent changes to her home insulin regimen.  She denies any other recent medication changes.  She states that she has not taken her home medications, including her blood pressure medications today.     Home Medications Prior to Admission medications   Medication Sig Start Date End Date Taking? Authorizing Provider  aspirin 81 MG chewable tablet Chew 1 tablet (81 mg total) by mouth daily. 10/10/19  Yes Black, Lezlie Octave, NP  atorvastatin (LIPITOR) 40 MG tablet Take 1 tablet (40 mg total) by mouth daily. 09/15/20  Yes Philemon Kingdom, MD  buPROPion (WELLBUTRIN) 100 MG tablet Take 100 mg by mouth daily. 04/28/22  Yes [provider]  Continuous Blood Gluc Receiver (FREESTYLE LIBRE READER) DEVI Use as instructed to check blood sugars 05/22/22  Yes Philemon Kingdom, MD  Continuous Blood Gluc Sensor (FREESTYLE LIBRE 3 SENSOR) MISC 1 each by Does not apply route every 14 (fourteen) days. 04/10/22  Yes Philemon Kingdom, MD  FARXIGA 10 MG TABS tablet Take 10 mg by mouth daily. 06/26/22  Yes [provider]  gabapentin (NEURONTIN) 600 MG tablet Take 600 mg by mouth 3 (three) times daily. 07/31/21  Yes [provider]   glipiZIDE (GLUCOTROL) 10 MG tablet Take 10 mg by mouth daily. 06/26/22  Yes [provider]  glucose blood (BAYER CONTOUR TEST) test strip TEST BLOOD SUGARS 3 TIMES DAILY 08/12/16  Yes Philemon Kingdom, MD  hydrOXYzine (ATARAX) 25 MG tablet Take 25 mg by mouth 3 (three) times daily as needed. itching 06/29/22  Yes [provider]  ibuprofen (ADVIL) 600 MG tablet Take 1 tablet (600 mg total) by mouth every 6 (six) hours as needed. 04/20/22  Yes Lamptey, Myrene Galas, MD  Insulin Pen Needle 32G X 4 MM MISC Use 4x a day 04/10/22  Yes Philemon Kingdom, MD  Lancet Devices (EASY TOUCH LANCING DEVICE) MISC Use to check diabetes 4 times daily  E 11.65 08/12/16  Yes Philemon Kingdom, MD  latanoprost (XALATAN) 0.005 % ophthalmic solution Place 1 drop into both eyes at bedtime. 12/02/18  Yes [provider]  methocarbamol (ROBAXIN) 500 MG tablet Take 1 tablet (500 mg total) by mouth at bedtime as needed for muscle spasms. 04/20/22  Yes Lamptey, Myrene Galas, MD  metoprolol succinate (TOPROL-XL) 50 MG 24 hr tablet TAKE 1 TABLET BY MOUTH ONCE DAILY. TAKE WITH OR IMMEDIATELY FOLLOWING A MEAL Patient taking differently: Take 50 mg by mouth daily. 04/10/20  Yes Adrian Prows, MD  Multiple Vitamin (MULTIVITAMIN) tablet Take 1 tablet by mouth daily.   Yes [provider]  ondansetron (ZOFRAN ODT) 4 MG disintegrating tablet '4mg'$  ODT q4 hours prn nausea/vomit 06/28/21  Yes Deno Etienne, DO  oxyCODONE-acetaminophen (  PERCOCET) 7.5-325 MG tablet Take 1 tablet by mouth 4 (four) times daily as needed. 10/07/19  Yes [provider]  OZEMPIC, 1 MG/DOSE, 4 MG/3ML SOPN Inject 1 mg into the skin once a week. Tuesday of each week 06/26/22  Yes [provider]  pantoprazole (PROTONIX) 40 MG tablet Take 40 mg by mouth daily. 09/02/21  Yes [provider]  solifenacin (VESICARE) 5 MG tablet Take 5 mg by mouth daily. 05/29/22  Yes [provider]  TRESIBA FLEXTOUCH 200 UNIT/ML FlexTouch  Pen INJECT 30 TO 34 UNITS SUBCUTANEOUSLY ONCE DAILY Patient taking differently: 25 Units in the morning. 05/27/22  Yes Philemon Kingdom, MD  vitamin B-12 (CYANOCOBALAMIN) 1000 MCG tablet Take 1,000 mcg by mouth daily.   Yes [provider]  amLODipine-valsartan (EXFORGE) 5-160 MG tablet Take 1 tablet by mouth every evening. Patient not taking: Reported on 07/04/2022 01/23/21   Adrian Prows, MD  insulin aspart (FIASP FLEXTOUCH) 100 UNIT/ML FlexTouch Pen Inject 6-20 Units into the skin 3 (three) times daily before meals. Patient not taking: Reported on 07/04/2022 04/10/22   Philemon Kingdom, MD  lidocaine (XYLOCAINE) 5 % ointment Apply 1 application. topically as needed. 04/20/22   Chase Picket, MD  metFORMIN (GLUCOPHAGE) 1000 MG tablet Take 1 tablet (1,000 mg total) by mouth 2 (two) times daily with a meal. Patient not taking: Reported on 07/04/2022 11/10/21   Philemon Kingdom, MD  naloxone [facility]) nasal spray 4 mg/0.1 mL Place 1 spray into the nose as needed. 12/09/20   [provider]  tiZANidine (ZANAFLEX) 4 MG tablet Take 4 mg by mouth 3 (three) times daily. Patient not taking: Reported on 07/04/2022 10/21/19   [provider]      Allergies    Oxycodone-acetaminophen and Penicillins    Review of Systems   Review of Systems  Cardiovascular:  Positive for chest pain.  Gastrointestinal:  Positive for abdominal pain, nausea and vomiting.  Genitourinary:  Positive for flank pain.  Musculoskeletal:  Positive for back pain.  All other systems reviewed and are negative.   Physical Exam Updated Vital Signs BP 136/75 (BP Location: Right Arm)   Pulse 87   Temp 98.4 F (36.9 C) (Oral)   Resp 16   Ht '5\' 4"'$  (1.626 m)   Wt 88 kg   SpO2 99%   BMI 33.30 kg/m  Physical Exam Vitals and nursing note reviewed.  Constitutional:      General: She is in acute distress.     Appearance: She is well-developed. She is ill-appearing and diaphoretic. She is not toxic-appearing.   HENT:     Head: Normocephalic and atraumatic.     Mouth/Throat:     Mouth: Mucous membranes are moist.     Pharynx: Oropharynx is clear.  Eyes:     Conjunctiva/sclera: Conjunctivae normal.  Cardiovascular:     Rate and Rhythm: Normal rate and regular rhythm.     Heart sounds: No murmur heard. Pulmonary:     Effort: Pulmonary effort is normal. No respiratory distress.     Breath sounds: Normal breath sounds. No wheezing or rales.  Chest:     Chest wall: No tenderness.  Abdominal:     Palpations: Abdomen is soft.     Tenderness: There is abdominal tenderness in the right upper quadrant and right lower quadrant. There is right CVA tenderness. There is no left CVA tenderness, guarding or rebound.  Musculoskeletal:        General: No swelling.  Cervical back: Neck supple.  Skin:    General: Skin is warm.     Capillary Refill: Capillary refill takes less than 2 seconds.     Coloration: Skin is not cyanotic or jaundiced.  Neurological:     General: No focal deficit present.     Mental Status: She is alert and oriented to person, place, and time.     Cranial Nerves: No cranial nerve deficit.     Motor: No weakness.  Psychiatric:        Mood and Affect: Mood normal.        Behavior: Behavior normal.     ED Results / Procedures / Treatments   Labs (all labs ordered are listed, but only abnormal results are displayed) Labs Reviewed  COMPREHENSIVE METABOLIC PANEL - Abnormal; Notable for the following components:      Result Value   Sodium 146 (*)    Potassium 3.4 (*)    Glucose, Bld 196 (*)    Total Protein 9.1 (*)    All other components within normal limits  RAPID URINE DRUG SCREEN, HOSP PERFORMED - Abnormal; Notable for the following components:   Tetrahydrocannabinol POSITIVE (*)    All other components within normal limits  CBC WITH DIFFERENTIAL/PLATELET - Abnormal; Notable for the following components:   WBC 12.4 (*)    Neutro Abs 9.1 (*)    All other components  within normal limits  URINALYSIS, ROUTINE W REFLEX MICROSCOPIC - Abnormal; Notable for the following components:   Color, Urine STRAW (*)    Glucose, UA >=500 (*)    Ketones, ur 5 (*)    All other components within normal limits  HEMOGLOBIN A1C - Abnormal; Notable for the following components:   Hgb A1c MFr Bld 7.8 (*)    All other components within normal limits  OSMOLALITY - Abnormal; Notable for the following components:   Osmolality 303 (*)    All other components within normal limits  LACTIC ACID, PLASMA - Abnormal; Notable for the following components:   Lactic Acid, Venous 2.5 (*)    All other components within normal limits  CBC WITH DIFFERENTIAL/PLATELET - Abnormal; Notable for the following components:   WBC 15.4 (*)    Neutro Abs 14.1 (*)    All other components within normal limits  CBC WITH DIFFERENTIAL/PLATELET - Abnormal; Notable for the following components:   WBC 13.0 (*)    Neutro Abs 11.0 (*)    All other components within normal limits  COMPREHENSIVE METABOLIC PANEL - Abnormal; Notable for the following components:   Glucose, Bld 161 (*)    Calcium 8.6 (*)    All other components within normal limits  GLUCOSE, CAPILLARY - Abnormal; Notable for the following components:   Glucose-Capillary 190 (*)    All other components within normal limits  GLUCOSE, CAPILLARY - Abnormal; Notable for the following components:   Glucose-Capillary 123 (*)    All other components within normal limits  CBG MONITORING, ED - Abnormal; Notable for the following components:   Glucose-Capillary 159 (*)    All other components within normal limits  MAGNESIUM  LIPASE, BLOOD  CK  PHOSPHORUS  TSH  LACTIC ACID, PLASMA  LACTIC ACID, PLASMA  CBC WITH DIFFERENTIAL/PLATELET  HIV ANTIBODY (ROUTINE TESTING W REFLEX)  CBC WITH DIFFERENTIAL/PLATELET  CBC WITH DIFFERENTIAL/PLATELET  TYPE AND SCREEN  ABO/RH  TROPONIN I (HIGH SENSITIVITY)  TROPONIN I (HIGH SENSITIVITY)    EKG EKG  Interpretation  Date/Time:  Saturday July 04 2022  12:44:30 EDT Ventricular Rate:  84 PR Interval:  132 QRS Duration: 95 QT Interval:  381 QTC Calculation: 451 R Axis:   32 Text Interpretation: Sinus rhythm Confirmed by Godfrey Pick ([id number]) on 07/04/2022 1:03:19 PM  Radiology CT Angio Chest/Abd/Pel for Dissection W and/or Wo Contrast  Result Date: 07/04/2022 CLINICAL DATA:  Right-sided pain, nausea and vomiting for 1 hour EXAM: CT ANGIOGRAPHY CHEST, ABDOMEN AND PELVIS TECHNIQUE: Non-contrast CT of the chest was initially obtained. Multidetector CT imaging through the chest, abdomen and pelvis was performed using the standard protocol during bolus administration of intravenous contrast. Multiplanar reconstructed images and MIPs were obtained and reviewed to evaluate the vascular anatomy. RADIATION DOSE REDUCTION: This exam was performed according to the departmental dose-optimization program which includes automated exposure control, adjustment of the mA and/or kV according to patient size and/or use of iterative reconstruction technique. CONTRAST:  168m OMNIPAQUE IOHEXOL 350 MG/ML SOLN COMPARISON:  07/04/2022, 06/28/2021 FINDINGS: CTA CHEST FINDINGS Cardiovascular: The thoracic aorta is unremarkable without evidence of aneurysm or dissection. Mild atherosclerosis of the aortic arch. The heart is unremarkable without pericardial effusion. Moderate coronary artery atherosclerosis. While not optimized for opacification of the pulmonary vasculature, there is sufficient contrast enhancement to exclude pulmonary emboli. No filling defects. Mediastinum/Nodes: No enlarged mediastinal, hilar, or axillary lymph nodes. Thyroid gland, trachea, and esophagus demonstrate no significant findings. Small hiatal hernia. Lungs/Pleura: No acute airspace disease, effusion, or pneumothorax. Central airways are widely patent. Musculoskeletal: No acute or destructive bony lesions. Reconstructed images demonstrate no additional  findings. Review of the MIP images confirms the above findings. CTA ABDOMEN AND PELVIS FINDINGS VASCULAR Aorta: Normal caliber aorta without aneurysm, dissection, vasculitis or significant stenosis. Minimal atherosclerosis at the aortic bifurcation. Celiac: Patent without evidence of aneurysm, dissection, vasculitis or significant stenosis. SMA: Patent without evidence of aneurysm, dissection, vasculitis or significant stenosis. Incidental replaced right hepatic artery off the SMA, a frequent anatomic variant. Renals: Both renal arteries are patent without evidence of aneurysm, dissection, vasculitis, fibromuscular dysplasia or significant stenosis. IMA: Patent without evidence of aneurysm, dissection, vasculitis or significant stenosis. Inflow: Patent without evidence of aneurysm, dissection, vasculitis or significant stenosis. Veins: No obvious venous abnormality within the limitations of this arterial phase study. Review of the MIP images confirms the above findings. NON-VASCULAR Hepatobiliary: No focal liver abnormality is seen. No gallstones, gallbladder wall thickening, or biliary dilatation. Pancreas: Unremarkable. No pancreatic ductal dilatation or surrounding inflammatory changes. Spleen: Normal in size without focal abnormality. Adrenals/Urinary Tract: Adrenal glands are stable. Kidneys are normal, without renal calculi, focal lesion, or hydronephrosis. Bladder is unremarkable. Stomach/Bowel: No bowel obstruction or ileus. Normal appendix right lower quadrant. No bowel wall thickening or inflammatory change. Lymphatic: No pathologic adenopathy within the abdomen or pelvis. Reproductive: Uterus and bilateral adnexa are unremarkable. Other: No free fluid or free intraperitoneal gas. No abdominal wall hernia. Musculoskeletal: No acute or destructive bony lesions. Reconstructed images demonstrate no additional findings. Review of the MIP images confirms the above findings. IMPRESSION: 1. No evidence of  thoracoabdominal aortic aneurysm or dissection. 2. No evidence of pulmonary embolus. 3. No acute intrathoracic, intra-abdominal, or intrapelvic process. 4. Aortic Atherosclerosis (ICD10-I70.0). Coronary artery atherosclerosis. 5. Small hiatal hernia. Electronically Signed   By: MRanda NgoM.D.   On: 07/04/2022 15:52   DG Chest Portable 1 View  Result Date: 07/04/2022 CLINICAL DATA:  Right-sided chest pain. EXAM: PORTABLE CHEST 1 VIEW COMPARISON:  06/28/2021 FINDINGS: 1329 hours. The lungs are clear without focal pneumonia, edema, pneumothorax or pleural effusion.  The cardiopericardial silhouette is within normal limits for size. The visualized bony structures of the thorax are unremarkable. Telemetry leads overlie the chest. IMPRESSION: No active disease. Electronically Signed   By: Misty Stanley M.D.   On: 07/04/2022 14:08    Procedures Procedures    Medications Ordered in ED Medications  metoprolol succinate (TOPROL-XL) 24 hr tablet 50 mg (50 mg Oral Given 07/04/22 1655)  insulin aspart (novoLOG) injection 0-9 Units (1 Units Subcutaneous Given 07/05/22 0457)  aspirin chewable tablet 81 mg (81 mg Oral Given 07/04/22 2220)  atorvastatin (LIPITOR) tablet 40 mg (40 mg Oral Given 07/04/22 2220)  buPROPion (WELLBUTRIN) tablet 100 mg (100 mg Oral Given 07/04/22 2230)  gabapentin (NEURONTIN) capsule 600 mg (600 mg Oral Given 07/04/22 2220)  latanoprost (XALATAN) 0.005 % ophthalmic solution 1 drop (1 drop Both Eyes Given 07/04/22 2218)  methocarbamol (ROBAXIN) tablet 500 mg (500 mg Oral Given 07/04/22 2230)  oxyCODONE-acetaminophen (PERCOCET) 7.5-325 MG per tablet 1 tablet (has no administration in time range)  0.9 %  sodium chloride infusion ( Intravenous Rate/Dose Change 07/04/22 2335)  acetaminophen (TYLENOL) tablet 650 mg (has no administration in time range)    Or  acetaminophen (TYLENOL) suppository 650 mg (has no administration in time range)  docusate sodium (COLACE) capsule 100 mg (100 mg Oral Given  07/04/22 2220)  senna (SENOKOT) tablet 8.6 mg (8.6 mg Oral Given 07/04/22 2221)  polyethylene glycol (MIRALAX / GLYCOLAX) packet 17 g (has no administration in time range)  bisacodyl (DULCOLAX) suppository 10 mg (has no administration in time range)  pantoprozole (PROTONIX) 80 mg /NS 100 mL infusion (8 mg/hr Intravenous New Bag/Given 07/04/22 2218)  pantoprazole (PROTONIX) injection 40 mg (has no administration in time range)  insulin glargine-yfgn (SEMGLEE) injection 20 Units (20 Units Subcutaneous Given 07/04/22 2347)  morphine (PF) 2 MG/ML injection 2 mg (2 mg Intravenous Given 07/04/22 2124)  lactated ringers bolus 1,000 mL (0 mLs Intravenous Stopped 07/04/22 1802)  ondansetron (ZOFRAN) injection 4 mg (4 mg Intravenous Given 07/04/22 1316)  HYDROmorphone (DILAUDID) injection 1 mg (1 mg Intravenous Given 07/04/22 1400)  iohexol (OMNIPAQUE) 350 MG/ML injection 100 mL (100 mLs Intravenous Contrast Given 07/04/22 1531)  dicyclomine (BENTYL) tablet 20 mg (20 mg Oral Given 07/04/22 1655)  metoCLOPramide (REGLAN) injection 10 mg (10 mg Intravenous Given 07/04/22 1655)  ondansetron (ZOFRAN) injection 4 mg (4 mg Intravenous Given 07/04/22 1901)  pantoprazole (PROTONIX) injection 40 mg (40 mg Intravenous Given 07/04/22 1942)  potassium chloride 10 mEq in 100 mL IVPB (10 mEq Intravenous New Bag/Given 07/04/22 2130)  pantoprazole (PROTONIX) injection 40 mg (40 mg Intravenous Given 07/04/22 2018)  prochlorperazine (COMPAZINE) injection 10 mg (10 mg Intravenous Given 07/04/22 2124)  sodium chloride 0.9 % bolus 1,000 mL (1,000 mLs Intravenous New Bag/Given 07/04/22 2345)    ED Course/ Medical Decision Making/ A&P Clinical Course as of 07/05/22 0603  Sat Jul 04, 2022  1613 Received signout from previous patient.  Presented with diffuse right flank and chest pain associate with nausea and vomiting inability to tolerate p.o.  Had a leukocytosis 12.4 with left shift.  Glucose 196, no anion gap and normal bicarbonate, no concern for DKA.   Mild hypokalemia 3.4.  Lipase within normal limits, no concern for pancreatitis.  CTA dissection scan negative and no acute intra-abdominal pathology explaining symptoms.  UA is pending as well as p.o. challenge. [VB]  1700 Reassessed patient, she is still having active emesis on exam when I reevaluated patient.  She has received multiple doses  of medications without improvement. Ordered another zofran.  Will page for admission for inability to tolerate p.o. [VB]  1915 Discussed case with hospitalist will be down to evaluate patient when orders for admission.  She requests potassium repletion, ordered for 2 IV doses since patient unable to tolerate p.o. [VB]    Clinical Course User Index [VB] Elgie Congo, MD                           Medical Decision Making Amount and/or Complexity of Data Reviewed Labs: ordered. Radiology: ordered.  Risk Prescription drug management. Decision regarding hospitalization.   This patient presents to the ED for concern of right flank and chest wall pain, nausea, and vomiting, this involves an extensive number of treatment options, and is a complaint that carries with it a high risk of complications and morbidity.  The differential diagnosis includes nephrolithiasis, pyelonephritis, appendicitis, pneumonia, constipation, SBO, cholecystitis, hepatitis, PE   Co morbidities that complicate the patient evaluation  HTN, GERD, T2DM, CAD, HLD   Additional history obtained:  Additional history obtained from patient's daughter External records from outside source obtained and reviewed including EMR   Lab Tests:  I Ordered, and personally interpreted labs.  The pertinent results include: Patient has mild hyperglycemia without evidence of DKA.  She has slight hyponatremia and hypokalemia with otherwise normal electrolytes.  A leukocytosis is present.  Hemoglobin, troponin, lipase, and hepatobiliary enzymes are normal.  There is no evidence of  UTI.   Imaging Studies ordered:  I ordered imaging studies including CTA dissection study I independently visualized and interpreted imaging which showed no acute findings to explain patient's symptoms I agree with the radiologist interpretation   Cardiac Monitoring: / EKG:  The patient was maintained on a cardiac monitor.  I personally viewed and interpreted the cardiac monitored which showed an underlying rhythm of: Sinus rhythm  Problem List / ED Course / Critical interventions / Medication management  Patient presents for cute onset of right flank pain, nausea, and vomiting.  On arrival in the ED, vital signs are notable for hypertension.  She reportedly has not taken her blood pressure medications today.  On initial assessment, patient is actively vomiting.  She is diaphoretic.  She endorses pain throughout her right flank and chest wall.  She has some mild tenderness in right flank and right CVA.  Laboratory work-up was initiated.  Patient given IV fluids, Dilaudid, and Zofran for symptomatic relief.  Laboratory work-up was initiated.  Initial results show a leukocytosis with otherwise unremarkable findings.  CTA dissection study was ordered.  On reassessment, patient reports improved symptoms.  She does continue pain and nausea.  Vomiting has subsided.  She was given Reglan and Bentyl for continued symptomatic relief.  Blood pressure remains elevated and home blood pressure medications were ordered.  CTA showed no acute findings to explain her symptoms.  Care of patient was signed out to oncoming ED provider. I ordered medication including IV fluids for vomiting and p.o. intolerance, Zofran and Reglan for nausea; Dilaudid and Bentyl for abdominal pain Reevaluation of the patient after these medicines showed that the patient improved I have reviewed the patients home medicines and have made adjustments as needed   Social Determinants of Health:  Has access to outpatient  care         Final Clinical Impression(s) / ED Diagnoses Final diagnoses:  Nausea and vomiting, unspecified vomiting type  Abdominal pain, unspecified abdominal location  Rx / DC Orders ED Discharge Orders     None         Godfrey Pick, MD 07/05/22 [phone]

## 2022-07-04 NOTE — ED Provider Notes (Signed)
Clinical Course as of 07/04/22 1917  Sat Jul 04, 2022  1613 Received signout from previous patient.  Presented with diffuse right flank and chest pain associate with nausea and vomiting inability to tolerate p.o.  Had a leukocytosis 12.4 with left shift.  Glucose 196, no anion gap and normal bicarbonate, no concern for DKA.  Mild hypokalemia 3.4.  Lipase within normal limits, no concern for pancreatitis.  CTA dissection scan negative and no acute intra-abdominal pathology explaining symptoms.  UA is pending as well as p.o. challenge. [VB]  2620 Reassessed patient, she is still having active emesis on exam when I reevaluated patient.  She has received multiple doses of medications without improvement. Ordered another zofran.  Will page for admission for inability to tolerate p.o. [VB]  1915 Discussed case with hospitalist will be down to evaluate patient when orders for admission.  She requests potassium repletion, ordered for 2 IV doses since patient unable to tolerate p.o. [VB]    Clinical Course User Index [VB] Elgie Congo, MD      Elgie Congo, MD 07/04/22 [id number]

## 2022-07-05 DIAGNOSIS — E1165 Type 2 diabetes mellitus with hyperglycemia: Secondary | ICD-10-CM | POA: Diagnosis not present

## 2022-07-05 DIAGNOSIS — D62 Acute posthemorrhagic anemia: Secondary | ICD-10-CM

## 2022-07-05 DIAGNOSIS — I2510 Atherosclerotic heart disease of native coronary artery without angina pectoris: Secondary | ICD-10-CM

## 2022-07-05 DIAGNOSIS — E1140 Type 2 diabetes mellitus with diabetic neuropathy, unspecified: Secondary | ICD-10-CM | POA: Diagnosis not present

## 2022-07-05 DIAGNOSIS — R112 Nausea with vomiting, unspecified: Secondary | ICD-10-CM | POA: Diagnosis not present

## 2022-07-05 DIAGNOSIS — Z6833 Body mass index (BMI) 33.0-33.9, adult: Secondary | ICD-10-CM

## 2022-07-05 DIAGNOSIS — K920 Hematemesis: Secondary | ICD-10-CM | POA: Diagnosis not present

## 2022-07-05 LAB — CBC WITH DIFFERENTIAL/PLATELET
Abs Immature Granulocytes: 0.06 10*3/uL (ref 0.00–0.07)
Abs Immature Granulocytes: 0.04 10*3/uL (ref 0.00–0.07)
Basophils Absolute: 0 10*3/uL (ref 0.0–0.1)
Basophils Absolute: 0 10*3/uL (ref 0.0–0.1)
Basophils Relative: 0 %
Basophils Relative: 0 %
Eosinophils Absolute: 0 10*3/uL (ref 0.0–0.5)
Eosinophils Absolute: 0.1 10*3/uL (ref 0.0–0.5)
Eosinophils Relative: 0 %
Eosinophils Relative: 1 %
HCT: 39.3 % (ref 36.0–46.0)
HCT: 37.7 % (ref 36.0–46.0)
Hemoglobin: 12.5 g/dL (ref 12.0–15.0)
Hemoglobin: 11.9 g/dL — ABNORMAL LOW (ref 12.0–15.0)
Immature Granulocytes: 1 %
Immature Granulocytes: 0 %
Lymphocytes Relative: 12 %
Lymphocytes Relative: 26 %
Lymphs Abs: 1.5 10*3/uL (ref 0.7–4.0)
Lymphs Abs: 3 10*3/uL (ref 0.7–4.0)
MCH: 29.6 pg (ref 26.0–34.0)
MCH: 29.4 pg (ref 26.0–34.0)
MCHC: 31.8 g/dL (ref 30.0–36.0)
MCHC: 31.6 g/dL (ref 30.0–36.0)
MCV: 93.1 fL (ref 80.0–100.0)
MCV: 93.1 fL (ref 80.0–100.0)
Monocytes Absolute: 0.4 10*3/uL (ref 0.1–1.0)
Monocytes Absolute: 0.8 10*3/uL (ref 0.1–1.0)
Monocytes Relative: 3 %
Monocytes Relative: 7 %
Neutro Abs: 11 10*3/uL — ABNORMAL HIGH (ref 1.7–7.7)
Neutro Abs: 7.6 10*3/uL (ref 1.7–7.7)
Neutrophils Relative %: 84 %
Neutrophils Relative %: 66 %
Platelets: 354 10*3/uL (ref 150–400)
Platelets: 350 10*3/uL (ref 150–400)
RBC: 4.22 MIL/uL (ref 3.87–5.11)
RBC: 4.05 MIL/uL (ref 3.87–5.11)
RDW: 12.7 % (ref 11.5–15.5)
RDW: 13 % (ref 11.5–15.5)
WBC: 13 10*3/uL — ABNORMAL HIGH (ref 4.0–10.5)
WBC: 11.5 10*3/uL — ABNORMAL HIGH (ref 4.0–10.5)
nRBC: 0 % (ref 0.0–0.2)
nRBC: 0 % (ref 0.0–0.2)

## 2022-07-05 LAB — COMPREHENSIVE METABOLIC PANEL
ALT: 21 U/L (ref 0–44)
AST: 19 U/L (ref 15–41)
Albumin: 3.8 g/dL (ref 3.5–5.0)
Alkaline Phosphatase: 83 U/L (ref 38–126)
Anion gap: 9 (ref 5–15)
BUN: 17 mg/dL (ref 8–23)
CO2: 24 mmol/L (ref 22–32)
Calcium: 8.6 mg/dL — ABNORMAL LOW (ref 8.9–10.3)
Chloride: 107 mmol/L (ref 98–111)
Creatinine, Ser: 0.62 mg/dL (ref 0.44–1.00)
GFR, Estimated: >60 mL/min (ref >60)
Glucose, Bld: 161 mg/dL — ABNORMAL HIGH (ref 70–99)
Potassium: 3.9 mmol/L (ref 3.5–5.1)
Sodium: 140 mmol/L (ref 135–145)
Total Bilirubin: 0.6 mg/dL (ref 0.3–1.2)
Total Protein: 7.3 g/dL (ref 6.5–8.1)

## 2022-07-05 LAB — HEMOGLOBIN A1C
Hgb A1c MFr Bld: 7.8 % — ABNORMAL HIGH (ref 4.8–5.6)
Mean Plasma Glucose: 177.16 mg/dL

## 2022-07-05 LAB — HEMOGLOBIN AND HEMATOCRIT, BLOOD
HCT: 38.7 % (ref 36.0–46.0)
HCT: 36.9 % (ref 36.0–46.0)
Hemoglobin: 11.7 g/dL — ABNORMAL LOW (ref 12.0–15.0)
Hemoglobin: 12 g/dL (ref 12.0–15.0)

## 2022-07-05 LAB — ABO/RH: ABO/RH(D): O POS

## 2022-07-05 LAB — LACTIC ACID, PLASMA
Lactic Acid, Venous: 1.6 mmol/L (ref 0.5–1.9)
Lactic Acid, Venous: 1.1 mmol/L (ref 0.5–1.9)

## 2022-07-05 LAB — TYPE AND SCREEN
ABO/RH(D): O POS
Antibody Screen: NEGATIVE

## 2022-07-05 LAB — HIV ANTIBODY (ROUTINE TESTING W REFLEX): HIV Screen 4th Generation wRfx: NONREACTIVE

## 2022-07-05 LAB — GLUCOSE, CAPILLARY
Glucose-Capillary: 123 mg/dL — ABNORMAL HIGH (ref 70–99)
Glucose-Capillary: 105 mg/dL — ABNORMAL HIGH (ref 70–99)
Glucose-Capillary: 133 mg/dL — ABNORMAL HIGH (ref 70–99)
Glucose-Capillary: 131 mg/dL — ABNORMAL HIGH (ref 70–99)
Glucose-Capillary: 204 mg/dL — ABNORMAL HIGH (ref 70–99)

## 2022-07-05 LAB — OSMOLALITY: Osmolality: 303 mOsm/kg — ABNORMAL HIGH (ref 275–295)

## 2022-07-05 MED ORDER — METHOCARBAMOL 500 MG PO TABS
500.0000 mg | ORAL_TABLET | Freq: Three times a day (TID) | ORAL | Status: DC
  Administered 2022-07-05 – 2022-07-06 (×3): 500 mg via ORAL
  Filled 2022-07-05 (×3): qty 1

## 2022-07-05 MED ORDER — SODIUM CHLORIDE 0.9 % IV SOLN
INTRAVENOUS | Status: AC
Start: 2022-07-05 — End: 2022-07-06

## 2022-07-05 NOTE — Progress Notes (Signed)
  Transition of Care [facility]) Screening Note   Patient Details  Name: [name] Date of Birth: 08/06/1961   Transition of Care [facility]) CM/SW Contact:    Dessa Phi, RN Phone Number: 07/05/2022, 3:39 PM    Transition of Care Department [facility]) has reviewed patient and no TOC needs have been identified at this time. We will continue to monitor patient advancement through interdisciplinary progression rounds. If new patient transition needs arise, please place a TOC consult.

## 2022-07-05 NOTE — Progress Notes (Signed)
[facility] PROGRESS NOTE   [name] QVZ:[id number] DOB: [birth year] DOA: 07/04/2022  PCP: Carylon Perches, NP  Brief History/Interval Summary:  61 y.o. female with medical history significant for DM 2, HTN CAD HLD, marijuana abuse, history of NSTEMI last stents in 2020, history of Campylobacter enteritis, GERD who presented with nausea vomiting.  Despite treatment in the emergency department she did not improve and so she was hospitalized.  Consultants: None  Procedures: None    Subjective/Interval History: Patient mentions that she is feeling better this morning.  Last emesis was yesterday evening.  Some nausea is present.  Denies any abdominal pain.  No further episodes of blood in the emesis.    Assessment/Plan:  Hematemesis in the setting of intractable nausea and vomiting Had multiple episodes of nausea vomiting without any blood followed by presence of blood in the vomit.  Appears to have had a Mallory-Weiss tear.  Gastroenterology was consulted.  Await their input this morning.  Continue PPI for now.  Stop the aspirin. Only mild drop in hemoglobin noted. Patient with side pain and back pain which is likely musculoskeletal.  Intractable nausea and vomiting Likely multifactorial including diabetes as well as marijuana use.  CT of the abdomen pelvis was unremarkable. Continue symptomatic treatment.  If she continues to have symptoms then may need to consider metoclopramide.  Acute blood loss anemia Hemoglobin lower than yesterday.  Some of the drop is likely dilutional. Continue to monitor.  Coronary artery disease Stable.  Holding aspirin.  Continue beta-blocker.  Diabetes mellitus type 2 with diabetic neuropathy Continue SSI.  Monitor CBGs.  Mild lactic acidosis Likely due to hypovolemia.  Improved.  Hyperlipidemia Continue Lipitor.  Hypokalemia Repleted.  Recheck tomorrow.  Check magnesium level.  Obesity Estimated body mass index is 33.3 kg/m as  calculated from the following:   Height as of this encounter: '5\' 4"'$  (1.626 m).   Weight as of this encounter: 88 kg.  DVT Prophylaxis: SCDs Code Status: Full code Family Communication: Discussed with patient Disposition Plan: Mobilize.  Await GI input.  Anticipate discharge in 24 to 48 hours.  Status is: Observation The patient will require care spanning > 2 midnights and should be moved to inpatient because: GI bleed, nausea vomiting.      Medications: Scheduled:  atorvastatin  40 mg Oral Daily   buPROPion  100 mg Oral Daily   docusate sodium  100 mg Oral BID   gabapentin  600 mg Oral TID   insulin aspart  0-9 Units Subcutaneous Q4H   insulin glargine-yfgn  20 Units Subcutaneous QHS   latanoprost  1 drop Both Eyes QHS   metoprolol succinate  50 mg Oral Daily   [START ON 07/08/2022] pantoprazole  40 mg Intravenous Q12H   senna  1 tablet Oral BID   Continuous:  sodium chloride     pantoprazole 8 mg/hr (07/04/22 2218)   PIR:JJOACZYSAYTKZ **OR** acetaminophen, bisacodyl, methocarbamol, morphine injection, oxyCODONE-acetaminophen, polyethylene glycol  Antibiotics: Anti-infectives (From admission, onward)    None       Objective:  Vital Signs  Vitals:   07/04/22 1900 07/04/22 2117 07/05/22 0134 07/05/22 0435  BP: (!) 189/85 (!) 175/82 124/78 136/75  Pulse: 92 91 89 87  Resp: '16 19 18 16  '$ Temp:  98.9 F (37.2 C) 98.4 F (36.9 C) 98.4 F (36.9 C)  TempSrc:  Oral Oral Oral  SpO2: 98% 97% 100% 99%  Weight:      Height:  Intake/Output Summary (Last 24 hours) at 07/05/2022 0935 Last data filed at 07/05/2022 0300 Gross per 24 hour  Intake 955.27 ml  Output --  Net 955.27 ml   Filed Weights   07/04/22 1238  Weight: 88 kg    General appearance: Awake alert.  In no distress Resp: Clear to auscultation bilaterally.  Normal effort Cardio: S1-S2 is normal regular.  No S3-S4.  No rubs murmurs or bruit GI: Abdomen is soft.  Mildly tender in the epigastric area  without any rebound rigidity or guarding.  No masses organomegaly.  Bowel sounds present Extremities: No edema.  Full range of motion of lower extremities. Neurologic: Alert and oriented x3.  No focal neurological deficits.    Lab Results:  Data Reviewed: I have personally reviewed following labs and reports of the imaging studies  CBC: Recent Labs  Lab 07/04/22 1353 07/04/22 2209 07/05/22 0133 07/05/22 0814  WBC 12.4* 15.4* 13.0* 11.5*  NEUTROABS 9.1* 14.1* 11.0* 7.6  HGB 14.4 14.5 12.5 11.9*  HCT 43.1 46.0 39.3 37.7  MCV 90.5 93.1 93.1 93.1  PLT 388 356 354 831    Basic Metabolic Panel: Recent Labs  Lab 07/04/22 1353 07/04/22 2209 07/05/22 0133  NA 146*  --  140  K 3.4*  --  3.9  CL 107  --  107  CO2 25  --  24  GLUCOSE 196*  --  161*  BUN 20  --  17  CREATININE 0.62  --  0.62  CALCIUM 10.2  --  8.6*  MG 2.2  --   --   PHOS  --  3.8  --     GFR: Estimated Creatinine Clearance: 78.3 mL/min (by C-G formula based on SCr of 0.62 mg/dL).  Liver Function Tests: Recent Labs  Lab 07/04/22 1353 07/05/22 0133  AST 22 19  ALT 25 21  ALKPHOS 108 83  BILITOT 0.5 0.6  PROT 9.1* 7.3  ALBUMIN 4.8 3.8    Recent Labs  Lab 07/04/22 1353  LIPASE 45     Cardiac Enzymes: Recent Labs  Lab 07/04/22 2209  CKTOTAL 194     HbA1C: Recent Labs    07/04/22 2209  HGBA1C 7.8*    CBG: Recent Labs  Lab 07/04/22 2016 07/04/22 2330 07/05/22 0433 07/05/22 0730  GLUCAP 159* 190* 123* 105*     Thyroid Function Tests: Recent Labs    07/04/22 2209  TSH 1.019     Radiology Studies: CT Angio Chest/Abd/Pel for Dissection W and/or Wo Contrast  Result Date: 07/04/2022 CLINICAL DATA:  Right-sided pain, nausea and vomiting for 1 hour EXAM: CT ANGIOGRAPHY CHEST, ABDOMEN AND PELVIS TECHNIQUE: Non-contrast CT of the chest was initially obtained. Multidetector CT imaging through the chest, abdomen and pelvis was performed using the standard protocol during bolus  administration of intravenous contrast. Multiplanar reconstructed images and MIPs were obtained and reviewed to evaluate the vascular anatomy. RADIATION DOSE REDUCTION: This exam was performed according to the departmental dose-optimization program which includes automated exposure control, adjustment of the mA and/or kV according to patient size and/or use of iterative reconstruction technique. CONTRAST:  193m OMNIPAQUE IOHEXOL 350 MG/ML SOLN COMPARISON:  07/04/2022, 06/28/2021 FINDINGS: CTA CHEST FINDINGS Cardiovascular: The thoracic aorta is unremarkable without evidence of aneurysm or dissection. Mild atherosclerosis of the aortic arch. The heart is unremarkable without pericardial effusion. Moderate coronary artery atherosclerosis. While not optimized for opacification of the pulmonary vasculature, there is sufficient contrast enhancement to exclude pulmonary emboli. No filling defects. Mediastinum/Nodes: No  enlarged mediastinal, hilar, or axillary lymph nodes. Thyroid gland, trachea, and esophagus demonstrate no significant findings. Small hiatal hernia. Lungs/Pleura: No acute airspace disease, effusion, or pneumothorax. Central airways are widely patent. Musculoskeletal: No acute or destructive bony lesions. Reconstructed images demonstrate no additional findings. Review of the MIP images confirms the above findings. CTA ABDOMEN AND PELVIS FINDINGS VASCULAR Aorta: Normal caliber aorta without aneurysm, dissection, vasculitis or significant stenosis. Minimal atherosclerosis at the aortic bifurcation. Celiac: Patent without evidence of aneurysm, dissection, vasculitis or significant stenosis. SMA: Patent without evidence of aneurysm, dissection, vasculitis or significant stenosis. Incidental replaced right hepatic artery off the SMA, a frequent anatomic variant. Renals: Both renal arteries are patent without evidence of aneurysm, dissection, vasculitis, fibromuscular dysplasia or significant stenosis. IMA:  Patent without evidence of aneurysm, dissection, vasculitis or significant stenosis. Inflow: Patent without evidence of aneurysm, dissection, vasculitis or significant stenosis. Veins: No obvious venous abnormality within the limitations of this arterial phase study. Review of the MIP images confirms the above findings. NON-VASCULAR Hepatobiliary: No focal liver abnormality is seen. No gallstones, gallbladder wall thickening, or biliary dilatation. Pancreas: Unremarkable. No pancreatic ductal dilatation or surrounding inflammatory changes. Spleen: Normal in size without focal abnormality. Adrenals/Urinary Tract: Adrenal glands are stable. Kidneys are normal, without renal calculi, focal lesion, or hydronephrosis. Bladder is unremarkable. Stomach/Bowel: No bowel obstruction or ileus. Normal appendix right lower quadrant. No bowel wall thickening or inflammatory change. Lymphatic: No pathologic adenopathy within the abdomen or pelvis. Reproductive: Uterus and bilateral adnexa are unremarkable. Other: No free fluid or free intraperitoneal gas. No abdominal wall hernia. Musculoskeletal: No acute or destructive bony lesions. Reconstructed images demonstrate no additional findings. Review of the MIP images confirms the above findings. IMPRESSION: 1. No evidence of thoracoabdominal aortic aneurysm or dissection. 2. No evidence of pulmonary embolus. 3. No acute intrathoracic, intra-abdominal, or intrapelvic process. 4. Aortic Atherosclerosis (ICD10-I70.0). Coronary artery atherosclerosis. 5. Small hiatal hernia. Electronically Signed   By: Randa Ngo M.D.   On: 07/04/2022 15:52   DG Chest Portable 1 View  Result Date: 07/04/2022 CLINICAL DATA:  Right-sided chest pain. EXAM: PORTABLE CHEST 1 VIEW COMPARISON:  06/28/2021 FINDINGS: 1329 hours. The lungs are clear without focal pneumonia, edema, pneumothorax or pleural effusion. The cardiopericardial silhouette is within normal limits for size. The visualized bony  structures of the thorax are unremarkable. Telemetry leads overlie the chest. IMPRESSION: No active disease. Electronically Signed   By: Misty Stanley M.D.   On: 07/04/2022 14:08       LOS: 0 days   [facility] Pager on [email or web address]  07/05/2022, 9:35 AM

## 2022-07-05 NOTE — Consult Note (Signed)
Referring Provider: [place] Primary Care Physician:  Carylon Perches, NP Primary Gastroenterologist: Althia Forts  Reason for Consultation: Hematemesis  HPI: [name] is a 61 y.o. female with past medical history of NSTEMI s/p PCI in 2020, history of diabetes, hypertension, coronary artery disease, marijuana use, history of Campylobacter enteritis presented to the hospital with nausea vomiting as well as blood in the vomiting.  According to patient, she uses marijuana on a regular basis.  She started having nausea and vomiting yesterday and after having multiple episodes of vomiting she saw some dark-colored blood in the vomit.  She denied any associated abdominal pain.  She is complaining of chronic back pain.  Denies any blood in the stool.  No bowel movement today.  Last EGD 2 years ago at [facility] for evaluation of nausea and vomiting was normal according to patient.  Records not available to review.  Past Medical History:  Diagnosis Date   Arthritis    Campylobacter enteritis May 2012   [facility] admission   Carpal tunnel syndrome, bilateral    Diabetes mellitus    Gastritis with bleeding due to alcohol 2010   [facility] admission   GERD (gastroesophageal reflux disease)    Hyperlipidemia    Hypertension    no meds   Menopause    Myocardial infarction [facility])    Neck pain    Wears glasses     Past Surgical History:  Procedure Laterality Date   CARPAL TUNNEL RELEASE Left 05/14/2014   Procedure: LEFT ULNAR NEUROPLASTY AT ELBOW AND ENDOSCOPIC CARPAL TUNNEL RELEASE;  Surgeon: Jolyn Nap, MD;  Location: [place];  Service: Orthopedics;  Laterality: Left;   CORONARY STENT INTERVENTION N/A 10/09/2019   Procedure: CORONARY STENT INTERVENTION;  Surgeon: Adrian Prows, MD;  Location: [place] CV LAB;  Service: Cardiovascular;  Laterality: N/A;   CYST EXCISION  1995   under arms   endometrial biopsy  2004   benign   FOOT OSTEOTOMY  2010   right-spurs   LEFT HEART CATH AND  CORONARY ANGIOGRAPHY N/A 10/09/2019   Procedure: LEFT HEART CATH AND CORONARY ANGIOGRAPHY;  Surgeon: Adrian Prows, MD;  Location: [place] CV [facility];  Service: Cardiovascular;  Laterality: N/A;   TONSILLECTOMY     ULNAR NERVE TRANSPOSITION Left 05/14/2014   Procedure: ULNAR NERVE DECOMPRESSION/TRANSPOSITION;  Surgeon: Jolyn Nap, MD;  Location: [place];  Service: Orthopedics;  Laterality: Left;    Prior to Admission medications   Medication Sig Start Date End Date Taking? Authorizing Provider  aspirin 81 MG chewable tablet Chew 1 tablet (81 mg total) by mouth daily. 10/10/19  Yes Black, Lezlie Octave, NP  atorvastatin (LIPITOR) 40 MG tablet Take 1 tablet (40 mg total) by mouth daily. 09/15/20  Yes Philemon Kingdom, MD  Continuous Blood Gluc Receiver (FREESTYLE LIBRE READER) DEVI Use as instructed to check blood sugars 05/22/22  Yes Philemon Kingdom, MD  Continuous Blood Gluc Sensor (FREESTYLE LIBRE 3 SENSOR) MISC 1 each by Does not apply route every 14 (fourteen) days. 04/10/22  Yes Philemon Kingdom, MD  FARXIGA 10 MG TABS tablet Take 10 mg by mouth daily. 06/26/22  Yes [provider]  gabapentin (NEURONTIN) 800 MG tablet Take 800 mg by mouth 3 (three) times daily. 07/31/21  Yes [provider]  glipiZIDE (GLUCOTROL) 10 MG tablet Take 10 mg by mouth daily. 06/26/22  Yes [provider]  glucose blood (BAYER CONTOUR TEST) test strip TEST BLOOD SUGARS 3 TIMES DAILY 08/12/16  Yes  Philemon Kingdom, MD  hydrOXYzine (ATARAX) 25 MG tablet Take 25 mg by mouth 3 (three) times daily as needed. itching 06/29/22  Yes [provider]  ibuprofen (ADVIL) 200 MG tablet Take 200 mg by mouth every 6 (six) hours as needed for mild pain.   Yes [provider]  Insulin Pen Needle 32G X 4 MM MISC Use 4x a day 04/10/22  Yes Philemon Kingdom, MD  Lancet Devices (EASY TOUCH LANCING DEVICE) MISC Use to check diabetes 4 times daily  E 11.65 08/12/16  Yes Philemon Kingdom, MD  latanoprost (XALATAN) 0.005 % ophthalmic solution Place 1 drop into both eyes at bedtime. 12/02/18  Yes [provider]  lidocaine (XYLOCAINE) 5 % ointment Apply 1 application. topically as needed. Patient taking differently: Apply 1 application  topically daily as needed for mild pain. 04/20/22  Yes Lamptey, Myrene Galas, MD  metoprolol succinate (TOPROL-XL) 50 MG 24 hr tablet TAKE 1 TABLET BY MOUTH ONCE DAILY. TAKE WITH OR IMMEDIATELY FOLLOWING A MEAL Patient taking differently: Take 50 mg by mouth daily. 04/10/20  Yes Adrian Prows, MD  Multiple Vitamin (MULTIVITAMIN) tablet Take 1 tablet by mouth daily.   Yes [provider]  naloxone (NARCAN) nasal spray 4 mg/0.1 mL Place 1 spray into the nose as needed. 12/09/20  Yes [provider]  ondansetron (ZOFRAN ODT) 4 MG disintegrating tablet '4mg'$  ODT q4 hours prn nausea/vomit Patient taking differently: Take 4 mg by mouth every 4 (four) hours as needed for nausea or vomiting. 06/28/21  Yes Deno Etienne, DO  oxyCODONE-acetaminophen (PERCOCET) 7.5-325 MG tablet Take 1 tablet by mouth 4 (four) times daily as needed. 10/07/19  Yes [provider]  OZEMPIC, 1 MG/DOSE, 4 MG/3ML SOPN Inject 1 mg into the skin once a week. Saturday of each week 06/26/22  Yes [provider]  pantoprazole (PROTONIX) 40 MG tablet Take 40 mg by mouth daily. 09/02/21  Yes [provider]  solifenacin (VESICARE) 5 MG tablet Take 5 mg by mouth daily. 05/29/22  Yes [provider]  tiZANidine (ZANAFLEX) 4 MG tablet Take 4 mg by mouth 3 (three) times daily. 10/21/19  Yes [provider]  TRESIBA FLEXTOUCH 200 UNIT/ML FlexTouch Pen INJECT 30 TO 34 UNITS SUBCUTANEOUSLY ONCE DAILY Patient taking differently: 25 Units in the morning. 05/27/22  Yes Philemon Kingdom, MD  triamcinolone cream (KENALOG) 0.1 % Apply 1 Application topically 3 (three) times daily as needed (itching).   Yes [provider]  vitamin B-12  (CYANOCOBALAMIN) 1000 MCG tablet Take 1,000 mcg by mouth daily.   Yes [provider]  amLODipine-valsartan (EXFORGE) 5-160 MG tablet Take 1 tablet by mouth every evening. Patient not taking: Reported on 07/04/2022 01/23/21   Adrian Prows, MD  buPROPion [facility]) 100 MG tablet Take 100 mg by mouth daily. Patient not taking: Reported on 07/05/2022 04/28/22   [provider]  ibuprofen (ADVIL) 600 MG tablet Take 1 tablet (600 mg total) by mouth every 6 (six) hours as needed. Patient not taking: Reported on 07/05/2022 04/20/22   Chase Picket, MD  insulin aspart (FIASP FLEXTOUCH) 100 UNIT/ML FlexTouch Pen Inject 6-20 Units into the skin 3 (three) times daily before meals. Patient not taking: Reported on 07/04/2022 04/10/22   Philemon Kingdom, MD  methocarbamol (ROBAXIN) 500 MG tablet Take 1 tablet (500 mg total) by mouth at bedtime as needed for muscle spasms. Patient not taking: Reported on 07/05/2022 04/20/22   Chase Picket, MD    Scheduled Meds:  atorvastatin  40 mg Oral Daily   docusate sodium  100 mg Oral BID   gabapentin  600 mg Oral TID   insulin aspart  0-9 Units Subcutaneous Q4H   insulin glargine-yfgn  20 Units Subcutaneous QHS   latanoprost  1 drop Both Eyes QHS   metoprolol succinate  50 mg Oral Daily   [START ON 07/08/2022] pantoprazole  40 mg Intravenous Q12H   senna  1 tablet Oral BID   Continuous Infusions:  sodium chloride 75 mL/hr at 07/05/22 0956   pantoprazole 8 mg/hr (07/05/22 1000)   PRN Meds:.acetaminophen **OR** acetaminophen, bisacodyl, methocarbamol, morphine injection, oxyCODONE-acetaminophen, polyethylene glycol  Allergies as of 07/04/2022 - Review Complete 07/04/2022  Allergen Reaction Noted   Oxycodone-acetaminophen Other (See Comments)    Penicillins Rash 01/31/2013    Family History  Problem Relation Age of Onset   Hypertension Mother    Diabetes Mother    Heart attack Mother    Stroke Father    Diabetes Father    Diabetes Maternal  Grandmother    Hypertension Maternal Grandmother    Heart attack Sister     Social History   Socioeconomic History   Marital status: Single    Spouse name: Not on file   Number of children: 2   Years of education: 12   Highest education level: High school graduate  Occupational History   Not on file  Tobacco Use   Smoking status: Every Day    Types: Cigarettes   Smokeless tobacco: Never  Vaping Use   Vaping Use: Never used  Substance and Sexual Activity   Alcohol use: Not Currently   Drug use: Yes    Types: Marijuana    Comment: sometimes   Sexual activity: Yes    Birth control/protection: None    Comment: same partner x 10 years  Other Topics Concern   Not on file  Social History Narrative   She works as a Sports coach.   Right-handed.   One cup caffeine per day.   She lives at home with daughter.   Social Determinants of Health   Financial Resource Strain: Not on file  Food Insecurity: Not on file  Transportation Needs: Not on file  Physical Activity: Not on file  Stress: Not on file  Social Connections: Not on file  Intimate Partner Violence: Not on file    Review of Systems: All negative except as stated above in HPI.  Physical Exam: Vital signs: Vitals:   07/05/22 0435 07/05/22 0953  BP: 136/75 125/60  Pulse: 87 72  Resp: 16 16  Temp: 98.4 F (36.9 C) (!) 97.5 F (36.4 C)  SpO2: 99% 97%     General:   Alert,  Well-developed, well-nourished, pleasant and cooperative in NAD Lungs:  Clear throughout to auscultation.   No wheezes, crackles, or rhonchi. No acute distress. Heart:  Regular rate and rhythm; no murmurs, clicks, rubs,  or gallops. Abdomen: Soft, nontender, nondistended, bowel sounds present, no peritoneal signs Rectal:  Deferred  GI:  Lab Results: Recent Labs    07/04/22 2209 07/05/22 0133 07/05/22 0814  WBC 15.4* 13.0* 11.5*  HGB 14.5 12.5 11.9*  HCT 46.0 39.3 37.7  PLT 356 354 350   BMET Recent Labs    07/04/22 1353  07/05/22 0133  NA 146* 140  K 3.4* 3.9  CL 107 107  CO2 25 24  GLUCOSE 196* 161*  BUN 20 17  CREATININE 0.62 0.62  CALCIUM 10.2 8.6*   LFT Recent Labs  07/05/22 0133  PROT 7.3  ALBUMIN 3.8  AST 19  ALT 21  ALKPHOS 83  BILITOT 0.6   PT/INR No results for input(s): "LABPROT", "INR" in the last 72 hours.   Studies/Results: CT Angio Chest/Abd/Pel for Dissection W and/or Wo Contrast  Result Date: 07/04/2022 CLINICAL DATA:  Right-sided pain, nausea and vomiting for 1 hour EXAM: CT ANGIOGRAPHY CHEST, ABDOMEN AND PELVIS TECHNIQUE: Non-contrast CT of the chest was initially obtained. Multidetector CT imaging through the chest, abdomen and pelvis was performed using the standard protocol during bolus administration of intravenous contrast. Multiplanar reconstructed images and MIPs were obtained and reviewed to evaluate the vascular anatomy. RADIATION DOSE REDUCTION: This exam was performed according to the departmental dose-optimization program which includes automated exposure control, adjustment of the mA and/or kV according to patient size and/or use of iterative reconstruction technique. CONTRAST:  123m OMNIPAQUE IOHEXOL 350 MG/ML SOLN COMPARISON:  07/04/2022, 06/28/2021 FINDINGS: CTA CHEST FINDINGS Cardiovascular: The thoracic aorta is unremarkable without evidence of aneurysm or dissection. Mild atherosclerosis of the aortic arch. The heart is unremarkable without pericardial effusion. Moderate coronary artery atherosclerosis. While not optimized for opacification of the pulmonary vasculature, there is sufficient contrast enhancement to exclude pulmonary emboli. No filling defects. Mediastinum/Nodes: No enlarged mediastinal, hilar, or axillary lymph nodes. Thyroid gland, trachea, and esophagus demonstrate no significant findings. Small hiatal hernia. Lungs/Pleura: No acute airspace disease, effusion, or pneumothorax. Central airways are widely patent. Musculoskeletal: No acute or  destructive bony lesions. Reconstructed images demonstrate no additional findings. Review of the MIP images confirms the above findings. CTA ABDOMEN AND PELVIS FINDINGS VASCULAR Aorta: Normal caliber aorta without aneurysm, dissection, vasculitis or significant stenosis. Minimal atherosclerosis at the aortic bifurcation. Celiac: Patent without evidence of aneurysm, dissection, vasculitis or significant stenosis. SMA: Patent without evidence of aneurysm, dissection, vasculitis or significant stenosis. Incidental replaced right hepatic artery off the SMA, a frequent anatomic variant. Renals: Both renal arteries are patent without evidence of aneurysm, dissection, vasculitis, fibromuscular dysplasia or significant stenosis. IMA: Patent without evidence of aneurysm, dissection, vasculitis or significant stenosis. Inflow: Patent without evidence of aneurysm, dissection, vasculitis or significant stenosis. Veins: No obvious venous abnormality within the limitations of this arterial phase study. Review of the MIP images confirms the above findings. NON-VASCULAR Hepatobiliary: No focal liver abnormality is seen. No gallstones, gallbladder wall thickening, or biliary dilatation. Pancreas: Unremarkable. No pancreatic ductal dilatation or surrounding inflammatory changes. Spleen: Normal in size without focal abnormality. Adrenals/Urinary Tract: Adrenal glands are stable. Kidneys are normal, without renal calculi, focal lesion, or hydronephrosis. Bladder is unremarkable. Stomach/Bowel: No bowel obstruction or ileus. Normal appendix right lower quadrant. No bowel wall thickening or inflammatory change. Lymphatic: No pathologic adenopathy within the abdomen or pelvis. Reproductive: Uterus and bilateral adnexa are unremarkable. Other: No free fluid or free intraperitoneal gas. No abdominal wall hernia. Musculoskeletal: No acute or destructive bony lesions. Reconstructed images demonstrate no additional findings. Review of the MIP  images confirms the above findings. IMPRESSION: 1. No evidence of thoracoabdominal aortic aneurysm or dissection. 2. No evidence of pulmonary embolus. 3. No acute intrathoracic, intra-abdominal, or intrapelvic process. 4. Aortic Atherosclerosis (ICD10-I70.0). Coronary artery atherosclerosis. 5. Small hiatal hernia. Electronically Signed   By: MRanda NgoM.D.   On: 07/04/2022 15:52   DG Chest Portable 1 View  Result Date: 07/04/2022 CLINICAL DATA:  Right-sided chest pain. EXAM: PORTABLE CHEST 1 VIEW COMPARISON:  06/28/2021 FINDINGS: 1329 hours. The lungs are clear without focal pneumonia, edema, pneumothorax or pleural effusion. The cardiopericardial silhouette  is within normal limits for size. The visualized bony structures of the thorax are unremarkable. Telemetry leads overlie the chest. IMPRESSION: No active disease. Electronically Signed   By: Misty Stanley M.D.   On: 07/04/2022 14:08    Impression/Plan: -Nausea and vomiting followed by coffee-ground emesis.  Likely Mallory-Weiss tear.  No further bleeding episodes.  CT angio chest abdomen pelvis negative for bleeding.  It showed small hiatal hernia. -Mild anemia. -Marijuana use  Recommendation ----------------------- -No further vomiting. -Discussed with hospital team.  Recommend Protonix 40 mg twice a day for 4 weeks followed by Protonix 40 mg once a day for additional 4 weeks. -Start soft diet and advance as tolerated -Avoid marijuana -Follow-up with primary GI doctor in [facility] after discharge. -GI will sign off.  Call us back if needed    LOS: 0 days   Otis Brace  MD, [place] 07/05/2022, 10:48 AM  Contact #  [phone]

## 2022-07-05 NOTE — Inpatient Diabetes Management (Signed)
Inpatient Diabetes Program Recommendations  AACE/ADA: New Consensus Statement on Inpatient Glycemic Control (2015)  Target Ranges:  Prepandial:   less than 140 mg/dL      Peak postprandial:   less than 180 mg/dL (1-2 hours)      Critically ill patients:  140 - 180 mg/dL   Lab Results  Component Value Date   GLUCAP 105 (H) 07/05/2022   HGBA1C 7.8 (H) 07/04/2022    Review of Glycemic Control  Latest Reference Range & Units 07/04/22 20:16 07/04/22 23:30 07/05/22 04:33 07/05/22 07:30  Glucose-Capillary 70 - 99 mg/dL 159 (H) 190 (H) 123 (H) 105 (H)  (H): Data is abnormally high  Diabetes history:  DM2  Outpatient Diabetes medications:  Tresibia 25 units QD Farxiga 10 mg QD Glipizide 10 mg QD Ozempic 1 mg weekly Fiasp (not taking per med rec) Freestyle Libre 3 CGM  Current orders for Inpatient glycemic control:  Semglee 20 units QD Novolog 0-9 units Q4H  Inpatient Diabetes Program Recommendations:    Received referral for uncontrolled DM.  Presents with N/V.    Please consider :  Semglee to 15 units QD  Novolog 0-9 units TID and 0-5 QHS once tolerating diet  Will continue to follow while inpatient.  Thank you, Reche Dixon, MSN, [place] Diabetes Coordinator Inpatient Diabetes Program [phone] (team pager from 8a-5p)

## 2022-07-06 ENCOUNTER — Observation Stay (HOSPITAL_COMMUNITY): Payer: BC Managed Care – PPO

## 2022-07-06 DIAGNOSIS — I25118 Atherosclerotic heart disease of native coronary artery with other forms of angina pectoris: Secondary | ICD-10-CM | POA: Diagnosis not present

## 2022-07-06 LAB — BASIC METABOLIC PANEL
Anion gap: 6 (ref 5–15)
BUN: 10 mg/dL (ref 8–23)
CO2: 24 mmol/L (ref 22–32)
Calcium: 9 mg/dL (ref 8.9–10.3)
Chloride: 110 mmol/L (ref 98–111)
Creatinine, Ser: 0.6 mg/dL (ref 0.44–1.00)
GFR, Estimated: >60 mL/min (ref >60)
Glucose, Bld: 159 mg/dL — ABNORMAL HIGH (ref 70–99)
Potassium: 3.8 mmol/L (ref 3.5–5.1)
Sodium: 140 mmol/L (ref 135–145)

## 2022-07-06 LAB — GLUCOSE, CAPILLARY
Glucose-Capillary: 119 mg/dL — ABNORMAL HIGH (ref 70–99)
Glucose-Capillary: 168 mg/dL — ABNORMAL HIGH (ref 70–99)
Glucose-Capillary: 139 mg/dL — ABNORMAL HIGH (ref 70–99)
Glucose-Capillary: 200 mg/dL — ABNORMAL HIGH (ref 70–99)

## 2022-07-06 LAB — CBC
HCT: 40.4 % (ref 36.0–46.0)
Hemoglobin: 12.9 g/dL (ref 12.0–15.0)
MCH: 29.5 pg (ref 26.0–34.0)
MCHC: 31.9 g/dL (ref 30.0–36.0)
MCV: 92.2 fL (ref 80.0–100.0)
Platelets: 383 10*3/uL (ref 150–400)
RBC: 4.38 MIL/uL (ref 3.87–5.11)
RDW: 12.8 % (ref 11.5–15.5)
WBC: 8.8 10*3/uL (ref 4.0–10.5)
nRBC: 0 % (ref 0.0–0.2)

## 2022-07-06 LAB — MAGNESIUM: Magnesium: 2 mg/dL (ref 1.7–2.4)

## 2022-07-06 MED ORDER — PANTOPRAZOLE SODIUM 40 MG PO TBEC: 40.0000 mg | DELAYED_RELEASE_TABLET | Freq: Two times a day (BID) | ORAL | 0 refills | Status: DC

## 2022-07-06 MED ORDER — METHOCARBAMOL 500 MG PO TABS: 500.0000 mg | ORAL_TABLET | Freq: Three times a day (TID) | ORAL | 0 refills | Status: DC | PRN

## 2022-07-06 MED ORDER — LORAZEPAM 2 MG/ML IJ SOLN
0.5000 mg | Freq: Once | INTRAMUSCULAR | Status: AC
  Administered 2022-07-06: 0.5 mg via INTRAVENOUS
  Filled 2022-07-06: qty 1

## 2022-07-06 NOTE — Inpatient Diabetes Management (Signed)
Inpatient Diabetes Program Recommendations  AACE/ADA: New Consensus Statement on Inpatient Glycemic Control (2015)  Target Ranges:  Prepandial:   less than 140 mg/dL      Peak postprandial:   less than 180 mg/dL (1-2 hours)      Critically ill patients:  140 - 180 mg/dL   Lab Results  Component Value Date   GLUCAP 200 (H) 07/06/2022   HGBA1C 7.8 (H) 07/04/2022    Review of Glycemic Control  Diabetes history: DM2 Outpatient Diabetes medications: Tresiba 25 QD, Farxiga 10 QD, glipizide 10 QD, Ozempic 1 mg weekly, Fiasp (not taking) Current orders for Inpatient glycemic control: Semglee 20 QHS, Novolog 0-9 units Q4H  HgbA1C - 7.8%  Inpatient Diabetes Program Recommendations:    Spoke with pt at bedside regarding her diabetes control and HgbA1C of 7.8%. Pt states she wants her HgbA1C lower than 7%. Pt is not taking her rapid-acting insulin although she has filled the prescription. Has Libre CGM to monitor her blood sugars. Does not have hypos at home.   Recommend for home: Fiasp 3 units TID with meals if eating a full meal  Has all supplies, pen needles, etc... To f/u with PCP after discharge.  Thank you. Lorenda Peck, RD, LDN, CDE Inpatient Diabetes Coordinator [phone]

## 2022-07-06 NOTE — Progress Notes (Signed)
NUTRITION NOTE  Consult received for assessment of nutrition status. MST score of 0. Patient noted to be out of the room to MRI. Diet advanced from CLD to [place] yesterday at 74 and to Soft yesterday at 1210. Flow sheet documentation indicates that she ate 25% of lunch yesterday and 100% of breakfast today.   Weight on 8/5 was 194 lb and weight is stable or up since 01/23/21. No information documented in the edema section of flow sheet this hospitalization. Skin WDL.   MD note from yesterday indicates likely Mallory-Weiss tear. GI recommended protonix, soft diet to advance as tolerated, and for patient to avoid marijuana. GI signed off after seeing patient yesterday.  Discharge order for d/c home entered; no discharge summary yet.   Will enter Low Fiber Nutrition Therapy handout in AVS. No additional nutrition-related needs identified at this time. If additional needs arise, please re-consult RD.      Jarome Matin, MS, RD, LDN, [place] Registered Dietitian II Inpatient Clinical Nutrition RD pager # and on-call/weekend pager # available in [facility]

## 2022-07-07 NOTE — Discharge Summary (Signed)
[facility]  Physician Discharge Summary   Patient ID: [name] MRN: [id number] DOB/AGE: 03-May-1961 61 y.o.  Admit date: 07/04/2022 Discharge date: 07/06/2022    PCP: Carylon Perches, NP  DISCHARGE DIAGNOSES:  Principal Problem:   Intractable nausea and vomiting Active Problems:   Hyperlipidemia associated with type 2 diabetes mellitus ([place])   Uncontrolled type 2 diabetes mellitus with hyperglycemia ([facility])   Obesity (BMI 30.0-34.9)   CAD (coronary artery disease)   Hematemesis   Hypokalemia   Lactic acidosis   RECOMMENDATIONS FOR OUTPATIENT FOLLOW UP: Follow-up with PCP next week.  Follow-up with GI as needed.   Home Health: None Equipment/Devices: None  CODE STATUS: Full code  DISCHARGE CONDITION: fair  Diet recommendation: As before  INITIAL HISTORY: 61 y.o. female with medical history significant for DM 2, HTN CAD HLD, marijuana abuse, history of NSTEMI last stents in 2020, history of Campylobacter enteritis, GERD who presented with nausea vomiting.  Despite treatment in the emergency department she did not improve and so she was hospitalized.   Consultants: [facility]   Procedures: None    HOSPITAL COURSE:   Hematemesis in the setting of intractable nausea and vomiting Had multiple episodes of nausea vomiting without any blood followed by presence of blood in the vomit.  Appears to have had a Mallory-Weiss tear.   Seen by gastroenterology.  Endoscopy was not indicated.  Hemoglobin has been stable.  Patient will be discharged on PPI twice a day.     Intractable nausea and vomiting Likely multifactorial including diabetes as well as marijuana use.  CT of the abdomen pelvis was unremarkable.  Symptoms have improved.  Tolerated soft diet without difficulty.   Acute blood loss anemia Slight drop in hemoglobin noted but stable for the most part.  Right-sided upper back pain No skin rash noted on examination.  Pain appears to be neuropathic.   She is already on Neurontin.  She was given muscle relaxants.  Some relief was noted.  MRI thoracic spine did not show any acute findings.  Patient reassured.  She does not have any neurological deficits.  She was asked to follow-up with her PCP for further management.  Coronary artery disease Continue home medications.  Aspirin can be resumed.   Diabetes mellitus type 2 with diabetic neuropathy   Mild lactic acidosis Likely due to hypovolemia.  Improved.  Hyperlipidemia Continue Lipitor.   Hypokalemia Repleted.  Obesity Estimated body mass index is 33.3 kg/m as calculated from the following:   Height as of this encounter: $RemoveBeforeD'5\' 4"'bViiewbxBVljuh$  (1.626 m).   Weight as of this encounter: 88 kg.    Patient is stable.  Okay for discharge home today.   PERTINENT LABS:  The results of significant diagnostics from this hospitalization (including imaging, microbiology, ancillary and laboratory) are listed below for reference.    Labs:   Basic Metabolic Panel: Recent Labs  Lab 07/04/22 1353 07/04/22 2209 07/05/22 0133 07/06/22 0349  NA 146*  --  140 140  K 3.4*  --  3.9 3.8  CL 107  --  107 110  CO2 25  --  24 24  GLUCOSE 196*  --  161* 159*  BUN 20  --  17 10  CREATININE 0.62  --  0.62 0.60  CALCIUM 10.2  --  8.6* 9.0  MG 2.2  --   --  2.0  PHOS  --  3.8  --   --    Liver Function Tests: Recent Labs  Lab 07/04/22  1353 07/05/22 0133  AST 22 19  ALT 25 21  ALKPHOS 108 83  BILITOT 0.5 0.6  PROT 9.1* 7.3  ALBUMIN 4.8 3.8   Recent Labs  Lab 07/04/22 1353  LIPASE 45    CBC: Recent Labs  Lab 07/04/22 1353 07/04/22 2209 07/05/22 0133 07/05/22 0814 07/05/22 1315 07/05/22 2002 07/06/22 0349  WBC 12.4* 15.4* 13.0* 11.5*  --   --  8.8  NEUTROABS 9.1* 14.1* 11.0* 7.6  --   --   --   HGB 14.4 14.5 12.5 11.9* 11.7* 12.0 12.9  HCT 43.1 46.0 39.3 37.7 38.7 36.9 40.4  MCV 90.5 93.1 93.1 93.1  --   --  92.2  PLT 388 356 354 350  --   --  383   Cardiac Enzymes: Recent Labs   Lab 07/04/22 2209  CKTOTAL 194     CBG: Recent Labs  Lab 07/05/22 1953 07/06/22 0128 07/06/22 0430 07/06/22 0746 07/06/22 1115  GLUCAP 204* 119* 168* 139* 200*     IMAGING STUDIES MR THORACIC SPINE WO CONTRAST  Result Date: 07/06/2022 CLINICAL DATA:  Mid to right-sided back pain since Friday. No injury. EXAM: MRI THORACIC SPINE WITHOUT CONTRAST TECHNIQUE: Multiplanar, multisequence MR imaging of the thoracic spine was performed. No intravenous contrast was administered. COMPARISON:  CT chest dated July 04, 2022. FINDINGS: Alignment:  Physiologic. Vertebrae: No fracture, evidence of discitis, or bone lesion. Cord:  Normal signal and morphology. Paraspinal and other soft tissues: 1.7 cm simple intramuscular lipoma within the left rhomboid major muscle (series 20, image 14). Otherwise negative. Disc levels: Scattered mild disc bulging and small disc protrusions. No spinal canal or neuroforaminal stenosis. IMPRESSION: 1. No acute abnormality. 2. Mild degenerative disc disease throughout the thoracic spine. No spinal canal or neuroforaminal stenosis at any level. Electronically Signed   By: Titus Dubin M.D.   On: 07/06/2022 10:09   CT Angio Chest/Abd/Pel for Dissection W and/or Wo Contrast  Result Date: 07/04/2022 CLINICAL DATA:  Right-sided pain, nausea and vomiting for 1 hour EXAM: CT ANGIOGRAPHY CHEST, ABDOMEN AND PELVIS TECHNIQUE: Non-contrast CT of the chest was initially obtained. Multidetector CT imaging through the chest, abdomen and pelvis was performed using the standard protocol during bolus administration of intravenous contrast. Multiplanar reconstructed images and MIPs were obtained and reviewed to evaluate the vascular anatomy. RADIATION DOSE REDUCTION: This exam was performed according to the departmental dose-optimization program which includes automated exposure control, adjustment of the mA and/or kV according to patient size and/or use of iterative reconstruction  technique. CONTRAST:  145mL OMNIPAQUE IOHEXOL 350 MG/ML SOLN COMPARISON:  07/04/2022, 06/28/2021 FINDINGS: CTA CHEST FINDINGS Cardiovascular: The thoracic aorta is unremarkable without evidence of aneurysm or dissection. Mild atherosclerosis of the aortic arch. The heart is unremarkable without pericardial effusion. Moderate coronary artery atherosclerosis. While not optimized for opacification of the pulmonary vasculature, there is sufficient contrast enhancement to exclude pulmonary emboli. No filling defects. Mediastinum/Nodes: No enlarged mediastinal, hilar, or axillary lymph nodes. Thyroid gland, trachea, and esophagus demonstrate no significant findings. Small hiatal hernia. Lungs/Pleura: No acute airspace disease, effusion, or pneumothorax. Central airways are widely patent. Musculoskeletal: No acute or destructive bony lesions. Reconstructed images demonstrate no additional findings. Review of the MIP images confirms the above findings. CTA ABDOMEN AND PELVIS FINDINGS VASCULAR Aorta: Normal caliber aorta without aneurysm, dissection, vasculitis or significant stenosis. Minimal atherosclerosis at the aortic bifurcation. Celiac: Patent without evidence of aneurysm, dissection, vasculitis or significant stenosis. SMA: Patent without evidence of aneurysm, dissection, vasculitis or  significant stenosis. Incidental replaced right hepatic artery off the SMA, a frequent anatomic variant. Renals: Both renal arteries are patent without evidence of aneurysm, dissection, vasculitis, fibromuscular dysplasia or significant stenosis. IMA: Patent without evidence of aneurysm, dissection, vasculitis or significant stenosis. Inflow: Patent without evidence of aneurysm, dissection, vasculitis or significant stenosis. Veins: No obvious venous abnormality within the limitations of this arterial phase study. Review of the MIP images confirms the above findings. NON-VASCULAR Hepatobiliary: No focal liver abnormality is seen. No  gallstones, gallbladder wall thickening, or biliary dilatation. Pancreas: Unremarkable. No pancreatic ductal dilatation or surrounding inflammatory changes. Spleen: Normal in size without focal abnormality. Adrenals/Urinary Tract: Adrenal glands are stable. Kidneys are normal, without renal calculi, focal lesion, or hydronephrosis. Bladder is unremarkable. Stomach/Bowel: No bowel obstruction or ileus. Normal appendix right lower quadrant. No bowel wall thickening or inflammatory change. Lymphatic: No pathologic adenopathy within the abdomen or pelvis. Reproductive: Uterus and bilateral adnexa are unremarkable. Other: No free fluid or free intraperitoneal gas. No abdominal wall hernia. Musculoskeletal: No acute or destructive bony lesions. Reconstructed images demonstrate no additional findings. Review of the MIP images confirms the above findings. IMPRESSION: 1. No evidence of thoracoabdominal aortic aneurysm or dissection. 2. No evidence of pulmonary embolus. 3. No acute intrathoracic, intra-abdominal, or intrapelvic process. 4. Aortic Atherosclerosis (ICD10-I70.0). Coronary artery atherosclerosis. 5. Small hiatal hernia. Electronically Signed   By: Randa Ngo M.D.   On: 07/04/2022 15:52   DG Chest Portable 1 View  Result Date: 07/04/2022 CLINICAL DATA:  Right-sided chest pain. EXAM: PORTABLE CHEST 1 VIEW COMPARISON:  06/28/2021 FINDINGS: 1329 hours. The lungs are clear without focal pneumonia, edema, pneumothorax or pleural effusion. The cardiopericardial silhouette is within normal limits for size. The visualized bony structures of the thorax are unremarkable. Telemetry leads overlie the chest. IMPRESSION: No active disease. Electronically Signed   By: Misty Stanley M.D.   On: 07/04/2022 14:08    DISCHARGE EXAMINATION: Vitals:   07/05/22 0435 07/05/22 0953 07/05/22 1956 07/06/22 0434  BP: 136/75 125/60 123/64 (!) 126/58  Pulse: 87 72 74 68  Resp: $Remo'16 16 18 18  'SgZKv$ Temp: 98.4 F (36.9 C) (!) 97.5 F  (36.4 C) 98.3 F (36.8 C) 98.3 F (36.8 C)  TempSrc: Oral Oral Oral Oral  SpO2: 99% 97% 98% 100%  Weight:      Height:       General appearance: Awake alert.  In no distress Resp: Clear to auscultation bilaterally.  Normal effort Cardio: S1-S2 is normal regular.  No S3-S4.  No rubs murmurs or bruit GI: Abdomen is soft.  Nontender nondistended.  Bowel sounds are present normal.  No masses organomegaly   DISPOSITION: Home  Discharge Instructions     Call MD for:  difficulty breathing, headache or visual disturbances   Complete by: As directed    Call MD for:  extreme fatigue   Complete by: As directed    Call MD for:  hives   Complete by: As directed    Call MD for:  persistant dizziness or light-headedness   Complete by: As directed    Call MD for:  persistant nausea and vomiting   Complete by: As directed    Call MD for:  severe uncontrolled pain   Complete by: As directed    Call MD for:  temperature >100.4   Complete by: As directed    Diet Carb Modified   Complete by: As directed    Discharge instructions   Complete by: As directed  Please take your medications as prescribed.  Please be sure to follow-up with your primary care provider for further management of your right-sided pain.  Seek attention if his symptoms of nausea vomiting recur.  You were cared for by a hospitalist during your hospital stay. If you have any questions about your discharge medications or the care you received while you were in the hospital after you are discharged, you can call the unit and asked to speak with the hospitalist on call if the hospitalist that took care of you is not available. Once you are discharged, your primary care physician will handle any further medical issues. Please note that NO REFILLS for any discharge medications will be authorized once you are discharged, as it is imperative that you return to your primary care physician (or establish a relationship with a primary care  physician if you do not have one) for your aftercare needs so that they can reassess your need for medications and monitor your lab values. If you do not have a primary care physician, you can call [phone] for a physician referral.   Increase activity slowly   Complete by: As directed           Allergies as of 07/06/2022       Reactions   Oxycodone-acetaminophen Other (See Comments)   Causes her to feel "hot".   Penicillins Rash   Did it involve swelling of the face/tongue/throat, SOB, or low BP? No Did it involve sudden or severe rash/hives, skin peeling, or any reaction on the inside of your mouth or nose? No Did you need to seek medical attention at a hospital or doctor's office? No When did it last happen?       If all above answers are "NO", may proceed with cephalosporin use.        Medication List     STOP taking these medications    amLODipine-valsartan 5-160 MG tablet Commonly known as: Exforge   buPROPion 100 MG tablet Commonly known as: WELLBUTRIN   ibuprofen 200 MG tablet Commonly known as: ADVIL   ibuprofen 600 MG tablet Commonly known as: ADVIL   tiZANidine 4 MG tablet Commonly known as: ZANAFLEX       TAKE these medications    aspirin 81 MG chewable tablet Chew 1 tablet (81 mg total) by mouth daily.   atorvastatin 40 MG tablet Commonly known as: LIPITOR Take 1 tablet (40 mg total) by mouth daily.   cyanocobalamin 1000 MCG tablet Commonly known as: VITAMIN B12 Take 1,000 mcg by mouth daily.   Easy Touch Lancing Device Misc Use to check diabetes 4 times daily  E 11.65   Farxiga 10 MG Tabs tablet Generic drug: dapagliflozin propanediol Take 10 mg by mouth daily.   Fiasp FlexTouch 100 UNIT/ML FlexTouch Pen Generic drug: insulin aspart Inject 6-20 Units into the skin 3 (three) times daily before meals.   FreeStyle Libre 3 Sensor Misc 1 each by Does not apply route every 14 (fourteen) days.   FreeStyle [employer] Use as  instructed to check blood sugars   gabapentin 800 MG tablet Commonly known as: NEURONTIN Take 800 mg by mouth 3 (three) times daily.   glipiZIDE 10 MG tablet Commonly known as: GLUCOTROL Take 10 mg by mouth daily.   glucose blood test strip Commonly known as: Estate manager/land agent TEST BLOOD SUGARS 3 TIMES DAILY   hydrOXYzine 25 MG tablet Commonly known as: ATARAX Take 25 mg by mouth 3 (three) times daily  as needed. itching   Insulin Pen Needle 32G X 4 MM Misc Use 4x a day   latanoprost 0.005 % ophthalmic solution Commonly known as: XALATAN Place 1 drop into both eyes at bedtime.   lidocaine 5 % ointment Commonly known as: XYLOCAINE Apply 1 application. topically as needed. What changed:  when to take this reasons to take this   methocarbamol 500 MG tablet Commonly known as: ROBAXIN Take 1 tablet (500 mg total) by mouth every 8 (eight) hours as needed for muscle spasms. What changed: when to take this   metoprolol succinate 50 MG 24 hr tablet Commonly known as: TOPROL-XL TAKE 1 TABLET BY MOUTH ONCE DAILY. TAKE WITH OR IMMEDIATELY FOLLOWING A MEAL What changed: See the new instructions.   multivitamin tablet Take 1 tablet by mouth daily.   naloxone 4 MG/0.1ML Liqd nasal spray kit Commonly known as: NARCAN Place 1 spray into the nose as needed.   ondansetron 4 MG disintegrating tablet Commonly known as: Zofran ODT $RemoveBe'4mg'CFxgRPAxK$  ODT q4 hours prn nausea/vomit What changed:  how much to take how to take this when to take this reasons to take this additional instructions   oxyCODONE-acetaminophen 7.5-325 MG tablet Commonly known as: PERCOCET Take 1 tablet by mouth 4 (four) times daily as needed.   Ozempic (1 MG/DOSE) 4 MG/3ML Sopn Generic drug: Semaglutide (1 MG/DOSE) Inject 1 mg into the skin once a week. Saturday of each week   pantoprazole 40 MG tablet Commonly known as: PROTONIX Take 1 tablet (40 mg total) by mouth 2 (two) times daily. What changed: when to take  this   solifenacin 5 MG tablet Commonly known as: VESICARE Take 5 mg by mouth daily.   Tyler Aas FlexTouch 200 UNIT/ML FlexTouch Pen Generic drug: insulin degludec INJECT 30 TO 34 UNITS SUBCUTANEOUSLY ONCE DAILY What changed: See the new instructions.   triamcinolone cream 0.1 % Commonly known as: KENALOG Apply 1 Application topically 3 (three) times daily as needed (itching).          Follow-up Information     Carylon Perches, NP. Schedule an appointment as soon as possible for a visit in 1 week(s).   Specialty: Family Medicine Why: post hospitalization follow up Contact information: [place] [address] [phone]                 TOTAL DISCHARGE TIME: 36 minutes  [place]  [facility] Pager on [email or web address]  07/07/2022, 11:20 AM

## 2022-07-16 ENCOUNTER — Ambulatory Visit: Payer: BC Managed Care – PPO | Admitting: Internal Medicine

## 2022-07-16 ENCOUNTER — Encounter: Payer: Self-pay | Admitting: Internal Medicine

## 2022-07-16 VITALS — BP 130/86 | HR 99 | Ht 64.0 in | Wt 190.4 lb

## 2022-07-16 DIAGNOSIS — Z6832 Body mass index (BMI) 32.0-32.9, adult: Secondary | ICD-10-CM

## 2022-07-16 DIAGNOSIS — E669 Obesity, unspecified: Secondary | ICD-10-CM | POA: Diagnosis not present

## 2022-07-16 DIAGNOSIS — E11319 Type 2 diabetes mellitus with unspecified diabetic retinopathy without macular edema: Secondary | ICD-10-CM | POA: Diagnosis not present

## 2022-07-16 DIAGNOSIS — E1169 Type 2 diabetes mellitus with other specified complication: Secondary | ICD-10-CM

## 2022-07-16 DIAGNOSIS — Z794 Long term (current) use of insulin: Secondary | ICD-10-CM | POA: Diagnosis not present

## 2022-07-16 DIAGNOSIS — E785 Hyperlipidemia, unspecified: Secondary | ICD-10-CM

## 2022-07-16 NOTE — Patient Instructions (Addendum)
Please restart: - Metformin 1000 mg 1x a day  Increase: - Tresiba U200 32 units daily - FiAsp: 10-14 units before b'fast 10-14 units before lunch 18-20 units before dinner  Restart: - Ozempic 1 mg weekly  Please return in 3 months with your sugar log.

## 2022-07-16 NOTE — Progress Notes (Signed)
Patient ID: [name], female   DOB: [birth year], 61 y.o.   MRN: [id number]  HPI: [name] is a 61 y.o.-year-old female, returning for f/u for DM2, dx ~2000, insulin-dependent since 2010, uncontrolled, with complications (PN, + DR). Last visit 4 months ago. She is not seeing Dr. Derrel Nip anymore as she does not want to drive to [facility].  Interim history: She continues to have generalized aches and pains. No increased urination, blurry vision, chest pain. She had severe sciatic back pain recently along with nausea/vomiting/abdominal pain (07/04/2022) - was admitted.  Lipase level was normal.  It was considered that the symptoms were related to smoking marijuana and also possibly her diabetes.  She Tyler Aas was decreased then.  She was also taken off Ozempic and metformin.  Reviewed HbA1c levels: Lab Results  Component Value Date   HGBA1C 7.8 (H) 07/04/2022   HGBA1C 6.5 (A) 04/10/2022   HGBA1C 7.7 (A) 11/10/2021   HGBA1C 7.3 (A) 01/24/2021   HGBA1C 7.1 (A) 09/13/2020   HGBA1C 6.4 (A) 05/13/2020   HGBA1C 7.6 (A) 08/31/2019   HGBA1C 7.8 (A) 11/25/2018   HGBA1C 6.8 (A) 07/25/2018   HGBA1C 6.4% 03/03/2018   HGBA1C 6.5 11/01/2017   HGBA1C 6.2 07/23/2017   HGBA1C 6.8 04/09/2017   HGBA1C 6.4 01/08/2017   HGBA1C 6.4 10/08/2016   HGBA1C 10.4 07/07/2016   HGBA1C 10.4 04/07/2016   HGBA1C 9.3 01/14/2016   HGBA1C 10.9 (H) 09/25/2015   HGBA1C 11.2 09/19/2015   HGBA1C 7.7 06/21/2015   HGBA1C 7.9 (H) 03/22/2015   HGBA1C 9.4 (H) 01/04/2015   HGBA1C 8.8 (H) 10/04/2014   HGBA1C 10.3 (H) 07/06/2014   HGBA1C 7.0 (H) 03/26/2014   HGBA1C 9.5 (H) 12/26/2013   HGBA1C 7.5 (H) 09/20/2013   HGBA1C 8.5 (H) 06/19/2013   HGBA1C 10.3 (H) 01/31/2013   Pt was on:  Insulin Before breakfast Before lunch Before dinner  R 10 10 (6 units if active after the meal) 20-25  NPH 15 - 20  Also: - Metformin 1000 mg twice a day  At last visit, I recommended to change to: - Tresiba U200 30 >> 25 units  daily - FiAsp 6-10 units before b'fast and lunch and 18-20 units before dinner >> misunderstood instructions: taking 34 units at 9 am (before b'fast) - Ozempic 1 mg weekly - only took it once in the week prior to her latest admission.  Pt is checking her sugars >4x a day with her CGM:   Prev.: - am: 92-154 >> 148 >> 83-200 >> 100-156, 185, 242 - 2h after b'fast: 154 >> n/c > 203 >> n/c >> 223 >> n/c - lunch: 81, 87 >> 100-130 >> 110-120 >> 202 - mid-afternoon:  248 >> 116, 189 >> 35, 56, 223 - before dinner: 135 >> 108-114 >> n/c  - 2h after dinner:  216, 219, 451 >> n/c  >> 164 - bedtime:150-180 >> 174-200 >> 160-170 >> 131, 189 - nighttime:  n/c >> 188, 200, 497 >> n/c >> 40s x1 >> ? Lowest sugar was 80s >> 29 (?) in 11/2020 >> 40s x1 >> 35 (skipped the meal) >> 63; she has hypoglycemia awareness in the 80s. Highest sugar was  497 ...>> 200s >> 300s >> 400.  -No CKD, last BUN/creatinine:  Lab Results  Component Value Date   BUN 10 07/06/2022   CREATININE 0.60 07/06/2022   ACR was normal: Lab Results  Component Value Date   MICRALBCREAT 3 01/24/2021   MICRALBCREAT 1.8 06/27/2018  MICRALBCREAT 1.6 06/23/2017   MICRALBCREAT 0.7 06/19/2016   MICRALBCREAT 0.4 09/25/2015   MICRALBCREAT 0.3 07/30/2014   MICRALBCREAT 0.5 06/19/2013   MICRALBCREAT 1.5 01/31/2013   MICRALBCREAT 2.6 11/20/2011  Not on ACE inhibitor/ARB  -+ HL; last set of lipids: Lab Results  Component Value Date   CHOL 124 01/24/2021   HDL 51 01/24/2021   LDLCALC 55 01/24/2021   LDLDIRECT 94.0 09/25/2015   TRIG 99 01/24/2021   CHOLHDL 2.4 01/24/2021  On Lipitor 40, Zetia 10.  - last eye exam was in 11/2021: + DR OU, reportedly glaucoma. She sees Dr. Arnoldo Morale.    -She has numbness and tingling in her feet.  She is on Neurontin.  She sees podiatry.  Last foot exam 01/2022.  She also has a history of HTN, episodic alcohol abuse, GERD, low back pain, OA.  She went to the emergency room 06/28/2021 for nausea,  vomiting, and abdominal pain.  Her lipase was high.  Ozempic was stopped. She was also in the ED post a fall down the stairs 08/2021. L forearm was in a splint >> saw ortho  ROS: Musculoskeletal: + muscle aches/+ joint aches Neurological: no tremors/+ numbness/+ tingling/no dizziness  I reviewed pt's medications, allergies, PMH, social hx, family hx, and changes were documented in the history of present illness. Otherwise, unchanged from my initial visit note.  Past Medical History:  Diagnosis Date   Arthritis    Campylobacter enteritis May 2012   [facility] admission   Carpal tunnel syndrome, bilateral    Diabetes mellitus    Gastritis with bleeding due to alcohol 2010   [facility] admission   GERD (gastroesophageal reflux disease)    Hyperlipidemia    Hypertension    no meds   Menopause    Myocardial infarction [facility])    Neck pain    Wears glasses    Past Surgical History:  Procedure Laterality Date   CARPAL TUNNEL RELEASE Left 05/14/2014   Procedure: LEFT ULNAR NEUROPLASTY AT ELBOW AND ENDOSCOPIC CARPAL TUNNEL RELEASE;  Surgeon: Jolyn Nap, MD;  Location: [place];  Service: Orthopedics;  Laterality: Left;   CORONARY STENT INTERVENTION N/A 10/09/2019   Procedure: CORONARY STENT INTERVENTION;  Surgeon: Adrian Prows, MD;  Location: [place] [facility];  Service: Cardiovascular;  Laterality: N/A;   CYST EXCISION  1995   under arms   endometrial biopsy  2004   benign   FOOT OSTEOTOMY  2010   right-spurs   LEFT HEART CATH AND CORONARY ANGIOGRAPHY N/A 10/09/2019   Procedure: LEFT HEART CATH AND CORONARY ANGIOGRAPHY;  Surgeon: Adrian Prows, MD;  Location: [place] [facility];  Service: Cardiovascular;  Laterality: N/A;   TONSILLECTOMY     ULNAR NERVE TRANSPOSITION Left 05/14/2014   Procedure: ULNAR NERVE DECOMPRESSION/TRANSPOSITION;  Surgeon: Jolyn Nap, MD;  Location: [place];  Service: Orthopedics;  Laterality: Left;   Social History    Socioeconomic History   Marital status: Single    Spouse name: Not on file   Number of children: 2   Years of education: 12   Highest education level: High school graduate  Occupational History   Not on file  Tobacco Use   Smoking status: Every Day    Types: Cigarettes   Smokeless tobacco: Never  Vaping Use   Vaping Use: Never used  Substance and Sexual Activity   Alcohol use: Not Currently   Drug use: Yes    Types: Marijuana    Comment: sometimes  Sexual activity: Yes    Birth control/protection: None    Comment: same partner x 10 years  Other Topics Concern   Not on file  Social History Narrative   She works as a Sports coach.   Right-handed.   One cup caffeine per day.   She lives at home with daughter.   Social Determinants of Health   Financial Resource Strain: Not on file  Food Insecurity: Not on file  Transportation Needs: Not on file  Physical Activity: Not on file  Stress: Not on file  Social Connections: Not on file  Intimate Partner Violence: Not on file   Current Outpatient Medications on File Prior to Visit  Medication Sig Dispense Refill   aspirin 81 MG chewable tablet Chew 1 tablet (81 mg total) by mouth daily.     atorvastatin (LIPITOR) 40 MG tablet Take 1 tablet (40 mg total) by mouth daily. 90 tablet 3   Continuous Blood Gluc Receiver (FREESTYLE LIBRE READER) DEVI Use as instructed to check blood sugars 1 each 0   Continuous Blood Gluc Sensor (FREESTYLE LIBRE 3 SENSOR) MISC 1 each by Does not apply route every 14 (fourteen) days. 6 each 3   FARXIGA 10 MG TABS tablet Take 10 mg by mouth daily.     gabapentin (NEURONTIN) 800 MG tablet Take 800 mg by mouth 3 (three) times daily.     glipiZIDE (GLUCOTROL) 10 MG tablet Take 10 mg by mouth daily.     glucose blood (BAYER CONTOUR TEST) test strip TEST BLOOD SUGARS 3 TIMES DAILY 200 each 11   hydrOXYzine (ATARAX) 25 MG tablet Take 25 mg by mouth 3 (three) times daily as needed. itching     insulin aspart  (FIASP FLEXTOUCH) 100 UNIT/ML FlexTouch Pen Inject 6-20 Units into the skin 3 (three) times daily before meals. (Patient not taking: Reported on 07/04/2022) 30 mL 3   Insulin Pen Needle 32G X 4 MM MISC Use 4x a day 300 each 3   Lancet Devices (EASY TOUCH LANCING DEVICE) MISC Use to check diabetes 4 times daily  E 11.65 1 each 11   latanoprost (XALATAN) 0.005 % ophthalmic solution Place 1 drop into both eyes at bedtime.     lidocaine (XYLOCAINE) 5 % ointment Apply 1 application. topically as needed. (Patient taking differently: Apply 1 application  topically daily as needed for mild pain.) 35.44 g 0   methocarbamol (ROBAXIN) 500 MG tablet Take 1 tablet (500 mg total) by mouth every 8 (eight) hours as needed for muscle spasms. 30 tablet 0   metoprolol succinate (TOPROL-XL) 50 MG 24 hr tablet TAKE 1 TABLET BY MOUTH ONCE DAILY. TAKE WITH OR IMMEDIATELY FOLLOWING A MEAL (Patient taking differently: Take 50 mg by mouth daily.) 30 tablet 3   Multiple Vitamin (MULTIVITAMIN) tablet Take 1 tablet by mouth daily.     naloxone (NARCAN) nasal spray 4 mg/0.1 mL Place 1 spray into the nose as needed.     ondansetron (ZOFRAN ODT) 4 MG disintegrating tablet '4mg'$  ODT q4 hours prn nausea/vomit (Patient taking differently: Take 4 mg by mouth every 4 (four) hours as needed for nausea or vomiting.) 20 tablet 0   oxyCODONE-acetaminophen (PERCOCET) 7.5-325 MG tablet Take 1 tablet by mouth 4 (four) times daily as needed.     OZEMPIC, 1 MG/DOSE, 4 MG/3ML SOPN Inject 1 mg into the skin once a week. Saturday of each week     pantoprazole (PROTONIX) 40 MG tablet Take 1 tablet (40 mg total) by mouth 2 (  two) times daily. 60 tablet 0   solifenacin (VESICARE) 5 MG tablet Take 5 mg by mouth daily.     TRESIBA FLEXTOUCH 200 UNIT/ML FlexTouch Pen INJECT 30 TO 34 UNITS SUBCUTANEOUSLY ONCE DAILY (Patient taking differently: 25 Units in the morning.) 9 mL 0   triamcinolone cream (KENALOG) 0.1 % Apply 1 Application topically 3 (three) times  daily as needed (itching).     vitamin B-12 (CYANOCOBALAMIN) 1000 MCG tablet Take 1,000 mcg by mouth daily.     No current facility-administered medications on file prior to visit.   Allergies  Allergen Reactions   Oxycodone-Acetaminophen Other (See Comments)    Causes her to feel "hot".   Penicillins Rash    Did it involve swelling of the face/tongue/throat, SOB, or low BP? No Did it involve sudden or severe rash/hives, skin peeling, or any reaction on the inside of your mouth or nose? No Did you need to seek medical attention at a hospital or doctor's office? No When did it last happen?       If all above answers are "NO", may proceed with cephalosporin use.   Family History  Problem Relation Age of Onset   Hypertension Mother    Diabetes Mother    Heart attack Mother    Stroke Father    Diabetes Father    Diabetes Maternal Grandmother    Hypertension Maternal Grandmother    Heart attack Sister    PE: BP 130/86 (BP Location: Left Arm, Patient Position: Sitting, Cuff Size: Normal)   Pulse 99   Ht '5\' 4"'$  (1.626 m)   Wt 190 lb 6.4 oz (86.4 kg)   SpO2 96%   BMI 32.68 kg/m    Wt Readings from Last 3 Encounters:  07/16/22 190 lb 6.4 oz (86.4 kg)  07/04/22 194 lb (88 kg)  04/10/22 183 lb (83 kg)   Constitutional: overweight, in NAD Eyes: no exophthalmos ENT: moist mucous membranes, no masses palpated in neck, no cervical lymphadenopathy Cardiovascular: RRR, No MRG Respiratory: CTA B Musculoskeletal: no deformities Skin: moist, warm, no rashes Neurological: no tremor with outstretched hands  ASSESSMENT: 1. DM2, insulin-dependent, uncontrolled, with complications - peripheral neuropathy - DR   2. Obesity class 1 - BMI 32.79  BMI Classification: < 18.5 underweight  18.5-24.9 normal weight  25.0-29.9 overweight  30.0-34.9 class I obesity  35.0-39.9 class II obesity  ? 40.0 class III obesity   3. HL  PLAN:  1. Patient with longstanding, uncontrolled, type 2  diabetes, with improved control before last visit, as she was taking her insulin more consistently.  HbA1c at that time was 6.5%.  However, since then, she had another HbA1c checked earlier this month and this was higher, at 7.8%.  She had an episode of nausea/vomiting/abdominal pain 07/04/2022.  Records were reviewed and the lipase was not elevated, but normal, at 45. -At last visit, we discussed about switching from NPH and regular insulin to analog insulins: [place] and Fiasp.  I also advised her to start the CGM. CGM interpretation: -At today's visit, we reviewed her CGM downloads: It appears that 29% of values are in target range (goal >70%), while 21% are higher than 180 (goal <25%), and 0% are lower than 70 (goal <4%).  The calculated average blood sugar is 236.  The projected HbA1c for the next 3 months (GMI) is 9%. -Reviewing the CGM trends, her sugars are much higher than before, fluctuating usually above the upper limit of the target range.  The peak around  12 PM, then decrease close to the target range and increase significantly after dinner, with slight improvement overnight. -Upon questioning, she is taking a lower dose of Tresiba than recommended, but, more importantly, she is taking the whole Fiasp dose in the morning before breakfast.  She did not look at the instructions printed for her at last visit.  At this visit I advised her about why and how to use Fiasp correctly.  We will increase the doses and have her take this before every meal. -At this visit we discussed about restarting Ozempic.  She has been on this last year and was admitted with the same symptoms: Nausea/vomiting/abdominal pain just as earlier this month.  Upon questioning, and reviewing her hospital chart, she was smoking marijuana at that time.  I advised her that this would be the third time that we are trying Ozempic.  She is adamant that Ozempic did not cause her symptoms, but I strongly advised her that if we are trying  it again, do not smoke marijuana since this may exacerbate the nausea/vomiting.  She agrees with this. -We will also try to restart metformin at a half maximal dose. - I advised her to: Patient Instructions  Please restart: - Metformin 1000 mg 1x a day  Increase: - Tresiba U200 32 units daily - FiAsp: 10-14 units before b'fast 10-14 units before lunch 18-20 units before dinner  Restart: - Ozempic 1 mg weekly  Please return in 3 months with your sugar log.  - advised to check sugars at different times of the day - 4x a day, rotating check times - advised for yearly eye exams >> she is UTD - return to clinic in 3-4 months  2. Obesity class 1 -We will tried Ozempic in the past but she had to come off due to nausea/vomiting/abdominal pain and a high lipase in 05/2021 -Weight was stable at last visit -She gained 7 pounds since then  3. HL -Reviewed latest lipid panel from 12/2020: Fractions at goal: Lab Results  Component Value Date   CHOL 124 01/24/2021   HDL 51 01/24/2021   LDLCALC 55 01/24/2021   LDLDIRECT 94.0 09/25/2015   TRIG 99 01/24/2021   CHOLHDL 2.4 01/24/2021  -She is on Lipitor 40 mg daily and Zetia 10 mg daily without side effects with  Philemon Kingdom, MD PhD [facility] Endocrinology

## 2022-07-29 ENCOUNTER — Ambulatory Visit: Payer: BC Managed Care – PPO | Admitting: Podiatry

## 2022-08-21 NOTE — Progress Notes (Unsigned)
[name] [place] [address] [place] Phone: [phone] Subjective:   IVilma Gamble, am serving as a scribe for Dr. Hulan Gamble.  I'm seeing this patient by the request  of:  Wendy Perches, NP  CC: Bilateral knee pain  URK:[id number]  02/17/2021 Patient does have significant arthritic changes of the left knee as well as the right knee.  Patient is having worsening pain at this time.  Patient has not responded to the injections and is having difficulty with all of the quality of life at this point.  Will refer patient to orthopedic surgery again.  Do feel that this will be the most beneficial for her.  Follow-up with me only on an as needed basis  Updated 08/24/2022 [name] is a 61 y.o. female coming in with complaint of bilateral knee pain. Knees still hurt, willing to talk to ortho about surgery at this point. She would like injections today.       Past Medical History:  Diagnosis Date   Arthritis    Campylobacter enteritis May 2012   [facility] admission   Carpal tunnel syndrome, bilateral    Diabetes mellitus    Gastritis with bleeding due to alcohol 2010   [facility] admission   GERD (gastroesophageal reflux disease)    Hyperlipidemia    Hypertension    no meds   Menopause    Myocardial infarction [facility])    Neck pain    Wears glasses    Past Surgical History:  Procedure Laterality Date   CARPAL TUNNEL RELEASE Left 05/14/2014   Procedure: LEFT ULNAR NEUROPLASTY AT ELBOW AND ENDOSCOPIC CARPAL TUNNEL RELEASE;  Surgeon: Wendy Nap, MD;  Location: [place];  Service: Orthopedics;  Laterality: Left;   CORONARY STENT INTERVENTION N/A 10/09/2019   Procedure: CORONARY STENT INTERVENTION;  Surgeon: Wendy Prows, MD;  Location: [place] [facility];  Service: Cardiovascular;  Laterality: N/A;   CYST EXCISION  1995   under arms   endometrial biopsy  2004   benign   FOOT OSTEOTOMY  2010   right-spurs   LEFT HEART CATH  AND CORONARY ANGIOGRAPHY N/A 10/09/2019   Procedure: LEFT HEART CATH AND CORONARY ANGIOGRAPHY;  Surgeon: Wendy Prows, MD;  Location: [place] [facility];  Service: Cardiovascular;  Laterality: N/A;   TONSILLECTOMY     ULNAR NERVE TRANSPOSITION Left 05/14/2014   Procedure: ULNAR NERVE DECOMPRESSION/TRANSPOSITION;  Surgeon: Wendy Nap, MD;  Location: [place];  Service: Orthopedics;  Laterality: Left;   Social History   Socioeconomic History   Marital status: Single    Spouse name: Not on file   Number of children: 2   Years of education: 12   Highest education level: High school graduate  Occupational History   Not on file  Tobacco Use   Smoking status: Every Day    Types: Cigarettes   Smokeless tobacco: Never  Vaping Use   Vaping Use: Never used  Substance and Sexual Activity   Alcohol use: Not Currently   Drug use: Yes    Types: Marijuana    Comment: sometimes   Sexual activity: Yes    Birth control/protection: None    Comment: same partner x 10 years  Other Topics Concern   Not on file  Social History Narrative   She works as a Sports coach.   Right-handed.   One cup caffeine per day.   She lives at home with daughter.   Social Determinants  of Health   Financial Resource Strain: Not on file  Food Insecurity: Not on file  Transportation Needs: Not on file  Physical Activity: Not on file  Stress: Not on file  Social Connections: Not on file   Allergies  Allergen Reactions   Oxycodone-Acetaminophen Other (See Comments)    Causes her to feel "hot".   Penicillins Rash    Did it involve swelling of the face/tongue/throat, SOB, or low BP? No Did it involve sudden or severe rash/hives, skin peeling, or any reaction on the inside of your mouth or nose? No Did you need to seek medical attention at a hospital or doctor's office? No When did it last happen?       If all above answers are "NO", may proceed with cephalosporin use.   Family History   Problem Relation Age of Onset   Hypertension Mother    Diabetes Mother    Heart attack Mother    Stroke Father    Diabetes Father    Diabetes Maternal Grandmother    Hypertension Maternal Grandmother    Heart attack Sister     Current Outpatient Medications (Endocrine & Metabolic):    FARXIGA 10 MG TABS tablet, Take 10 mg by mouth daily.   glipiZIDE (GLUCOTROL) 10 MG tablet, Take 10 mg by mouth daily.   insulin aspart (FIASP FLEXTOUCH) 100 UNIT/ML FlexTouch Pen, Inject 6-20 Units into the skin 3 (three) times daily before meals. (Patient not taking: Reported on 07/04/2022)   OZEMPIC, 1 MG/DOSE, 4 MG/3ML SOPN, Inject 1 mg into the skin once a week. Saturday of each week   TRESIBA FLEXTOUCH 200 UNIT/ML FlexTouch Pen, INJECT 30 TO 34 UNITS SUBCUTANEOUSLY ONCE DAILY (Patient taking differently: 25 Units in the morning.)  Current Outpatient Medications (Cardiovascular):    atorvastatin (LIPITOR) 40 MG tablet, Take 1 tablet (40 mg total) by mouth daily.   metoprolol succinate (TOPROL-XL) 50 MG 24 hr tablet, TAKE 1 TABLET BY MOUTH ONCE DAILY. TAKE WITH OR IMMEDIATELY FOLLOWING A MEAL (Patient taking differently: Take 50 mg by mouth daily.)   Current Outpatient Medications (Analgesics):    aspirin 81 MG chewable tablet, Chew 1 tablet (81 mg total) by mouth daily.   oxyCODONE-acetaminophen (PERCOCET) 7.5-325 MG tablet, Take 1 tablet by mouth 4 (four) times daily as needed.  Current Outpatient Medications (Hematological):    vitamin B-12 (CYANOCOBALAMIN) 1000 MCG tablet, Take 1,000 mcg by mouth daily.  Current Outpatient Medications (Other):    Continuous Blood Gluc Sensor (FREESTYLE LIBRE 3 SENSOR) MISC, 1 each by Does not apply route every 14 (fourteen) days.   gabapentin (NEURONTIN) 800 MG tablet, Take 800 mg by mouth 3 (three) times daily.   glucose blood (BAYER CONTOUR TEST) test strip, TEST BLOOD SUGARS 3 TIMES DAILY   hydrOXYzine (ATARAX) 25 MG tablet, Take 25 mg by mouth 3 (three)  times daily as needed. itching   Insulin Pen Needle 32G X 4 MM MISC, Use 4x a day   Lancet Devices (EASY TOUCH LANCING DEVICE) MISC, Use to check diabetes 4 times daily  E 11.65   latanoprost (XALATAN) 0.005 % ophthalmic solution, Place 1 drop into both eyes at bedtime.   lidocaine (XYLOCAINE) 5 % ointment, Apply 1 application. topically as needed. (Patient taking differently: Apply 1 application  topically daily as needed for mild pain.)   methocarbamol (ROBAXIN) 500 MG tablet, Take 1 tablet (500 mg total) by mouth every 8 (eight) hours as needed for muscle spasms.   Multiple Vitamin (MULTIVITAMIN) tablet,  Take 1 tablet by mouth daily.   naloxone (NARCAN) nasal spray 4 mg/0.1 mL, Place 1 spray into the nose as needed.   ondansetron (ZOFRAN ODT) 4 MG disintegrating tablet, '4mg'$  ODT q4 hours prn nausea/vomit (Patient taking differently: Take 4 mg by mouth every 4 (four) hours as needed for nausea or vomiting.)   pantoprazole (PROTONIX) 40 MG tablet, Take 1 tablet (40 mg total) by mouth 2 (two) times daily.   solifenacin (VESICARE) 5 MG tablet, Take 5 mg by mouth daily.   triamcinolone cream (KENALOG) 0.1 %, Apply 1 Application topically 3 (three) times daily as needed (itching).   Reviewed prior external information including notes and imaging from  primary care provider As well as notes that were available from care everywhere and other healthcare systems.  Past medical history, social, surgical and family history all reviewed in electronic medical record.  No pertanent information unless stated regarding to the chief complaint.   Review of Systems:  No headache, visual changes, nausea, vomiting, diarrhea, constipation, dizziness, abdominal pain, skin rash, fevers, chills, night sweats, weight loss, swollen lymph nodes, body aches, joint swelling, chest pain, shortness of breath, mood changes. POSITIVE muscle aches  Objective  Blood pressure 122/70, pulse 77, height '5\' 4"'$  (1.626 m), weight 188  lb (85.3 kg), SpO2 99 %.   General: No apparent distress alert and oriented x3 mood and affect normal, dressed appropriately.  HEENT: Pupils equal, extraocular movements intact  Respiratory: Patient's speak in full sentences and does not appear short of breath  Cardiovascular: No lower extremity edema, non tender, no erythema  Knee exam bilaterally does show some instability with valgus and varus force.  Patient does have some crepitus noted with range of motion.  Lacks the last 10 degrees of flexion.  Instability with valgus and varus force.   After informed written and verbal consent, patient was seated on exam table. Right knee was prepped with alcohol swab and utilizing anterolateral approach, patient's right knee space was injected with 4:1  marcaine 0.5%: Kenalog '40mg'$ /dL. Patient tolerated the procedure well without immediate complications.  After informed written and verbal consent, patient was seated on exam table. Left knee was prepped with alcohol swab and utilizing anterolateral approach, patient's left knee space was injected with 4:1  marcaine 0.5%: Kenalog '40mg'$ /dL. Patient tolerated the procedure well without immediate complications.    Impression and Recommendations:    The above documentation has been reviewed and is accurate and complete Lyndal Pulley, DO

## 2022-08-24 ENCOUNTER — Ambulatory Visit (INDEPENDENT_AMBULATORY_CARE_PROVIDER_SITE_OTHER): Payer: BC Managed Care – PPO | Admitting: Family Medicine

## 2022-08-24 VITALS — BP 122/70 | HR 77 | Ht 64.0 in | Wt 188.0 lb

## 2022-08-24 DIAGNOSIS — M25561 Pain in right knee: Secondary | ICD-10-CM | POA: Diagnosis not present

## 2022-08-24 DIAGNOSIS — G8929 Other chronic pain: Secondary | ICD-10-CM

## 2022-08-24 DIAGNOSIS — M170 Bilateral primary osteoarthritis of knee: Secondary | ICD-10-CM

## 2022-08-24 DIAGNOSIS — M25562 Pain in left knee: Secondary | ICD-10-CM

## 2022-08-24 NOTE — Patient Instructions (Addendum)
Referral to Ortho: Dr. Ninfa Linden Knee injections today Good to see you!

## 2022-08-25 ENCOUNTER — Encounter: Payer: Self-pay | Admitting: Family Medicine

## 2022-08-25 NOTE — Assessment & Plan Note (Signed)
Severe arthritic changes of the knees bilaterally.  Patient feels at this point that she may need surgical intervention would like to be referred to discuss different time.  Patient will be referred and understands that we will not be having any type of replacement in the next 2 to 3 months.  Patient encouraged to continue to work on weight loss but does have a BMI under 35.  Discussed icing regimen and home exercises.  Can follow-up with me in 3 months if needed

## 2022-09-02 ENCOUNTER — Encounter: Payer: Self-pay | Admitting: Podiatry

## 2022-09-02 ENCOUNTER — Ambulatory Visit (INDEPENDENT_AMBULATORY_CARE_PROVIDER_SITE_OTHER): Payer: BC Managed Care – PPO | Admitting: Podiatry

## 2022-09-02 DIAGNOSIS — M79674 Pain in right toe(s): Secondary | ICD-10-CM | POA: Diagnosis not present

## 2022-09-02 DIAGNOSIS — Z794 Long term (current) use of insulin: Secondary | ICD-10-CM

## 2022-09-02 DIAGNOSIS — B351 Tinea unguium: Secondary | ICD-10-CM | POA: Diagnosis not present

## 2022-09-02 DIAGNOSIS — E1140 Type 2 diabetes mellitus with diabetic neuropathy, unspecified: Secondary | ICD-10-CM

## 2022-09-02 DIAGNOSIS — M79675 Pain in left toe(s): Secondary | ICD-10-CM | POA: Diagnosis not present

## 2022-09-02 NOTE — Progress Notes (Signed)
This patient returns to my office for at risk foot care.  This patient requires this care by a professional since this patient will be at risk due to having diabetic neuropathy.  This patient is unable to cut nails herself since the patient cannot reach her nails.These nails are painful walking and wearing shoes.  This patient presents for at risk foot care today.  General Appearance  Alert, conversant and in no acute stress.  Vascular  Dorsalis pedis and posterior tibial  pulses are palpable  bilaterally.  Capillary return is within normal limits  bilaterally. Temperature is within normal limits  bilaterally.  Neurologic  Senn-Weinstein monofilament wire test diminished  bilaterally. Muscle power within normal limits bilaterally.  Nails Thick disfigured discolored nails with subungual debris  from hallux to fifth toes bilaterally. No evidence of bacterial infection or drainage bilaterally.  Orthopedic  No limitations of motion  feet .  No crepitus or effusions noted.  No bony pathology or digital deformities noted.  Skin  normotropic skin with no porokeratosis noted bilaterally.  No signs of infections or ulcers noted.     Onychomycosis  Pain in right toes  Pain in left toes  Consent was obtained for treatment procedures.   Mechanical debridement of nails 1-5  bilaterally performed with a nail nipper.  Filed with dremel without incident.    Return office visit   9 weeks                  Told patient to return for periodic foot care and evaluation due to potential at risk complications.   Lakyra Tippins DPM   

## 2022-09-09 ENCOUNTER — Ambulatory Visit (INDEPENDENT_AMBULATORY_CARE_PROVIDER_SITE_OTHER): Payer: BC Managed Care – PPO | Admitting: Orthopaedic Surgery

## 2022-09-09 ENCOUNTER — Ambulatory Visit: Payer: Self-pay

## 2022-09-09 ENCOUNTER — Ambulatory Visit (INDEPENDENT_AMBULATORY_CARE_PROVIDER_SITE_OTHER): Payer: BC Managed Care – PPO

## 2022-09-09 VITALS — Ht 64.0 in | Wt 188.0 lb

## 2022-09-09 DIAGNOSIS — M25561 Pain in right knee: Secondary | ICD-10-CM | POA: Diagnosis not present

## 2022-09-09 DIAGNOSIS — G8929 Other chronic pain: Secondary | ICD-10-CM

## 2022-09-09 DIAGNOSIS — M25562 Pain in left knee: Secondary | ICD-10-CM | POA: Diagnosis not present

## 2022-09-09 DIAGNOSIS — M1712 Unilateral primary osteoarthritis, left knee: Secondary | ICD-10-CM | POA: Diagnosis not present

## 2022-09-09 NOTE — Progress Notes (Signed)
The patient is a 61 year old female sent to me from Dr. Hulan Saas to evaluate and treat significant arthritis in her left knee.  She does have bilateral knee pain but the left is much worse than the right.  She has had multiple injections in the past and she said they used to help but now they have not helped at all.  She points the medial aspect of her left knee is the main source of her pain.  She is a diabetic and her last hemoglobin A1c however was 7.8 which is significantly high.  She is working on better blood glucose control and says she has a repeat visit in drawl of that lab in late November.  At this point her knee pain is definitely affecting her mobility, her quality of life and her actives daily living.  On exam her left knee does have some slight varus malalignment that is easily correctable.  There is medial joint line tenderness and patellofemoral crepitation with good range of motion of the knee.  Her right knee is much straighter with a more benign exam.  An AP and lateral both knees were obtained today.  There is significant arthritis of the left knee with varus malalignment, medial joint space narrowing and patellofemoral narrowing.  The right knee appears more normal with joint spaces that are more congruent and open.  I talked her in length in detail about knee replacement surgery for her left knee.  I showed her knee replacement model and explained in detail what the surgery involves.  However we cannot schedule this given her high hemoglobin A1c due to a much higher risk of infection.  She understands that we cannot schedule this until her A1c is much lower than 7.8.  We will see her back in December to go over this again in terms of knee replacement surgery and if her labs are improved we can work on scheduling her for knee replacement.  All question concerns were answered and addressed.

## 2022-09-24 ENCOUNTER — Ambulatory Visit: Payer: BC Managed Care – PPO | Admitting: Cardiology

## 2022-09-24 ENCOUNTER — Encounter: Payer: Self-pay | Admitting: Cardiology

## 2022-09-24 VITALS — BP 116/82 | HR 85 | Temp 97.8°F | Resp 16 | Ht 64.0 in | Wt 179.0 lb

## 2022-09-24 DIAGNOSIS — I25118 Atherosclerotic heart disease of native coronary artery with other forms of angina pectoris: Secondary | ICD-10-CM

## 2022-09-24 DIAGNOSIS — F17200 Nicotine dependence, unspecified, uncomplicated: Secondary | ICD-10-CM

## 2022-09-24 DIAGNOSIS — E782 Mixed hyperlipidemia: Secondary | ICD-10-CM

## 2022-09-24 DIAGNOSIS — I10 Essential (primary) hypertension: Secondary | ICD-10-CM

## 2022-09-24 DIAGNOSIS — K219 Gastro-esophageal reflux disease without esophagitis: Secondary | ICD-10-CM

## 2022-09-24 NOTE — Progress Notes (Signed)
Primary Physician/Referring:  Carylon Perches, NP  Patient ID: [name], female    DOB: 1961/11/28, 61 y.o.   MRN: [id number]  Chief Complaint  Patient presents with  . Hypertension  . Coronary Artery Disease  . Follow-up    1 year   HPI:    [name]  is a 61 y.o. AAF  with diabetes mellitus, hyperlipidemia, prior cigarette smoking quit in 2013, smokes marijuana occasionally, CAD  Presenting with NSTEMI, S/P stenting to mid right and also to the distal LAD by implantation of DES on 10/09/2019.  This is a 36-monthoffice visit, states that she is doing well.  Denies any chest pain or shortness of breath.  She is trying her best to lose weight.  Recently oral hypoglycemic agents have been discontinued and switched over to Ozempic by her PCP.  Past Medical History:  Diagnosis Date  . Arthritis   . [facility]itis May 2012   [facility]  . Carpal tunnel syndrome, bilateral   . Diabetes mellitus   . [facility] with [facility] due to alcohol [facility]  . GERD (gastroesophageal reflux disease)   . Hyperlipidemia   . Hypertension    no meds  . Menopause   . [place]   . Neck pain   . Wears glasses    Past Surgical History:  Procedure Laterality Date  . CARPAL TUNNEL RELEASE Left 05/14/2014   Procedure: LEFT ULNAR NEUROPLASTY AT ELBOW AND ENDOSCOPIC CARPAL TUNNEL RELEASE;  Surgeon: DJolyn Nap MD;  [place]  Service: Orthopedics;  Laterality: Left;  . CORONARY STENT INTERVENTION N/A 10/09/2019   Procedure: CORONARY STENT INTERVENTION;  Surgeon: GAdrian Prows MD;  [facility];  Service: Cardiovascular;  Laterality: N/A;  . CYST EXCISION  1995   under arms  . endometrial biopsy  2004   benign  . FOOT OSTEOTOMY  2010   right-spurs  . LEFT HEART CATH AND CORONARY ANGIOGRAPHY N/A 10/09/2019   Procedure: LEFT HEART CATH AND CORONARY ANGIOGRAPHY;  Surgeon: GAdrian Prows MD;  [place] [facility];  Service: Cardiovascular;  Laterality: N/A;  . TONSILLECTOMY    . ULNAR NERVE TRANSPOSITION Left 05/14/2014   Procedure: ULNAR NERVE DECOMPRESSION/TRANSPOSITION;  Surgeon: DJolyn Nap MD;  [place]  Service: Orthopedics;  Laterality: Left;   Social History   Tobacco Use  . Smoking status: Never  . Smokeless tobacco: Never  Substance Use Topics  . Alcohol use: Not Currently   Marital Status: Single   ROS  Review of Systems  Cardiovascular:  Negative for chest pain, dyspnea on exertion and leg swelling.  Gastrointestinal:  Negative for melena.  Objective      09/24/2022    2:47 PM 09/09/2022    3:15 PM 08/24/2022    2:37 PM  Vitals with BMI  Height '5\' 4"'$  '5\' 4"'$  '5\' 4"'$   Weight 179 lbs 188 lbs 188 lbs  BMI 30.71 [phone] Systolic 1016 1553 Diastolic 82  70  Pulse 85  77    Physical Exam Neck:     Vascular: No carotid bruit or JVD.  Cardiovascular:     Rate and Rhythm: Normal rate and regular rhythm.     Pulses: Intact distal pulses.     Heart sounds: Normal heart sounds. No murmur heard.    No gallop.  Pulmonary:     Effort: Pulmonary effort is normal.  Breath sounds: Normal breath sounds.  Abdominal:     General: Bowel sounds are normal.     Palpations: Abdomen is soft.  Musculoskeletal:     Right lower leg: No edema.     Left lower leg: No edema.   Laboratory examination:   Recent Labs    07/04/22 1353 07/05/22 0133 07/06/22 0349  NA 146* 140 140  K 3.4* 3.9 3.8  CL 107 107 110  CO2 '25 24 24  '$ GLUCOSE 196* 161* 159*  BUN '20 17 10  '$ CREATININE 0.62 0.62 0.60  CALCIUM 10.2 8.6* 9.0  GFRNONAA >60 >60 >60    CrCl cannot be calculated (Patient's most recent lab result is older than the maximum 21 days allowed.).     Latest Ref Rng & Units 07/06/2022    3:49 AM 07/05/2022    1:33 AM 07/04/2022    1:53 PM  CMP  Glucose 70 - 99 mg/dL 159  161  196   BUN 8 - 23 mg/dL '10  17  20   '$ Creatinine 0.44 - 1.00  mg/dL 0.60  0.62  0.62   Sodium 135 - 145 mmol/L 140  140  146   Potassium 3.5 - 5.1 mmol/L 3.8  3.9  3.4   Chloride 98 - 111 mmol/L 110  107  107   CO2 22 - 32 mmol/L '24  24  25   '$ Calcium 8.9 - 10.3 mg/dL 9.0  8.6  10.2   Total Protein 6.5 - 8.1 g/dL  7.3  9.1   Total Bilirubin 0.3 - 1.2 mg/dL  0.6  0.5   Alkaline Phos 38 - 126 U/L  83  108   AST 15 - 41 U/L  19  22   ALT 0 - 44 U/L  21  25       Latest Ref Rng & Units 07/06/2022    3:49 AM 07/05/2022    8:02 PM 07/05/2022    1:15 PM  CBC  WBC 4.0 - 10.5 K/uL 8.8     Hemoglobin 12.0 - 15.0 g/dL 12.9  12.0  11.7   Hematocrit 36.0 - 46.0 % 40.4  36.9  38.7   Platelets 150 - 400 K/uL 383       Lab Results  Component Value Date   CHOL 124 01/24/2021   HDL 51 01/24/2021   LDLCALC 55 01/24/2021   LDLDIRECT 94.0 09/25/2015   TRIG 99 01/24/2021   CHOLHDL 2.4 01/24/2021   HEMOGLOBIN A1C Lab Results  Component Value Date   HGBA1C 7.8 (H) 07/04/2022   MPG 177.16 07/04/2022   TSH Recent Labs    07/04/22 2209  TSH 1.019   Medications and allergies   Allergies  Allergen Reactions  . Oxycodone-Acetaminophen Other (See Comments)    Causes her to feel "hot".  . Penicillins Rash    Did it involve swelling of the face/tongue/throat, SOB, or low BP? No Did it involve sudden or severe rash/hives, skin peeling, or any reaction on the inside of your mouth or nose? No Did you need to seek medical attention at a hospital or doctor's office? No When did it last happen?       If all above answers are "NO", may proceed with cephalosporin use.     Current Outpatient Medications:  .  aspirin 81 MG chewable tablet, Chew 1 tablet (81 mg total) by mouth daily., Disp:  , Rfl:  .  Continuous Blood Gluc Sensor (FREESTYLE LIBRE 3 SENSOR) MISC, 1 each  by Does not apply route every 14 (fourteen) days., Disp: 6 each, Rfl: 3 .  FARXIGA 10 MG TABS tablet, Take 10 mg by mouth daily., Disp: , Rfl:  .  gabapentin (NEURONTIN) 800 MG tablet, Take 800 mg  by mouth 3 (three) times daily., Disp: , Rfl:  .  glucose blood (BAYER CONTOUR TEST) test strip, TEST BLOOD SUGARS 3 TIMES DAILY, Disp: 200 each, Rfl: 11 .  hydrOXYzine (ATARAX) 25 MG tablet, Take 25 mg by mouth 3 (three) times daily as needed. itching, Disp: , Rfl:  .  insulin aspart (FIASP FLEXTOUCH) 100 UNIT/ML FlexTouch Pen, Inject 6-20 Units into the skin 3 (three) times daily before meals., Disp: 30 mL, Rfl: 3 .  Insulin Pen Needle 32G X 4 MM MISC, Use 4x a day, Disp: 300 each, Rfl: 3 .  Lancet Devices (EASY TOUCH LANCING DEVICE) MISC, Use to check diabetes 4 times daily  E 11.65, Disp: 1 each, Rfl: 11 .  latanoprost (XALATAN) 0.005 % ophthalmic solution, Place 1 drop into both eyes at bedtime., Disp: , Rfl:  .  lidocaine (XYLOCAINE) 5 % ointment, Apply 1 application. topically as needed. (Patient taking differently: Apply 1 application  topically daily as needed for mild pain.), Disp: 35.44 g, Rfl: 0 .  methocarbamol (ROBAXIN) 500 MG tablet, Take 1 tablet (500 mg total) by mouth every 8 (eight) hours as needed for muscle spasms., Disp: 30 tablet, Rfl: 0 .  Multiple Vitamin (MULTIVITAMIN) tablet, Take 1 tablet by mouth daily., Disp: , Rfl:  .  naloxone (NARCAN) nasal spray 4 mg/0.1 mL, Place 1 spray into the nose as needed., Disp: , Rfl:  .  oxyCODONE-acetaminophen (PERCOCET) 7.5-325 MG tablet, Take 1 tablet by mouth 4 (four) times daily as needed., Disp: , Rfl:  .  OZEMPIC, 1 MG/DOSE, 4 MG/3ML SOPN, Inject 1 mg into the skin once a week. Saturday of each week, Disp: , Rfl:  .  pantoprazole (PROTONIX) 40 MG tablet, Take 1 tablet (40 mg total) by mouth 2 (two) times daily., Disp: 60 tablet, Rfl: 0 .  solifenacin (VESICARE) 5 MG tablet, Take 5 mg by mouth daily., Disp: , Rfl:  .  TRESIBA FLEXTOUCH 200 UNIT/ML FlexTouch Pen, INJECT 30 TO 34 UNITS SUBCUTANEOUSLY ONCE DAILY (Patient taking differently: 25 Units in the morning.), Disp: 9 mL, Rfl: 0 .  triamcinolone cream (KENALOG) 0.1 %, Apply 1  Application topically 3 (three) times daily as needed (itching)., Disp: , Rfl:  .  vitamin B-12 (CYANOCOBALAMIN) 1000 MCG tablet, Take 1,000 mcg by mouth daily., Disp: , Rfl:  .  atorvastatin (LIPITOR) 40 MG tablet, Take 1 tablet (40 mg total) by mouth daily. (Patient not taking: Reported on 09/24/2022), Disp: 90 tablet, Rfl: 3 .  metoprolol succinate (TOPROL-XL) 50 MG 24 hr tablet, TAKE 1 TABLET BY MOUTH ONCE DAILY. TAKE WITH OR IMMEDIATELY FOLLOWING A MEAL (Patient taking differently: Take 50 mg by mouth daily.), Disp: 30 tablet, Rfl: 3   Radiology:   CXR 10/08/2019:  The heart size and mediastinal contours are within normal limits. Both lungs are clear. The visualized skeletal structures are Unremarkable.  Cardiac Studies:    Echocardiogram 10/09/2019:   1. Left ventricular ejection fraction, by visual estimation, is 50 to 55%. The left ventricle has low normal function. There is mildly increased left ventricular hypertrophy. Basal to mid inferolateral hypokinesis  2. The average left ventricular global longitudinal strain is -14.9 %.  3. Global right ventricle has normal systolic function.The right ventricular size is  normal. No increase in right ventricular wall thickness.   Left heart catheterization 10/09/2019: Hyperdynamic LV, EF 65 to 70%, EDP normal. RCA is a dominant vessel.  Ulcerated tandem 80% followed by a 99% stenosis.  SP 2.5 x 38 and a 2.5 x 8 mm resolute Onyx DES stents implanted, second stent used for distal edge dissection.  Stenosis reduced to 0% with TIMI-3 to TIMI-3 flow. Circumflex coronary artery is smooth with minimal disease.  Gives origin to large OM1 and OM 2.  Tortuous. LAD is diffusely diseased from the mid to distal segment.  There is a hazy 70 to 80% stenosis in the mid LAD.  Following which distal LAD had a 40% diffuse disease.  Mild calcification is evident.  SP 2.25 x 15 mm resolute Onyx DES postdilated with 2.25 x 8 mm [place] at 22 atmospheric pressure  with 0% residual stenosis.  TIMI-3 to TIMI-3 flow.    Carotid artery duplex  08/13/2020: Minimal stenosis in the left internal carotid artery (1-15%). Stenosis in the left common carotid artery (<50%). Antegrade right vertebral artery flow. Antegrade left vertebral artery flow. Follow up studies if clinically indicated.  EKG:  EKG 09/26/2021: Normal sinus rhythm at rate of 83 bpm, normal axis, no evidence of ischemia, normal EKG. no significant change from 01/23/2021.  Assessment     ICD-10-CM   1. Essential hypertension  I10     2. Coronary artery disease involving native coronary artery of native heart with other form of angina pectoris ([place])  I25.118 EKG 12-Lead    3. Mixed hyperlipidemia  E78.2     4. Tobacco use disorder  F17.200       No orders of the defined types were placed in this encounter.  Medications Discontinued During This Encounter  Medication Reason  . glipiZIDE (GLUCOTROL) 10 MG tablet   . ondansetron (ZOFRAN ODT) 4 MG disintegrating tablet    Orders Placed This Encounter  Procedures  . EKG 12-Lead      Recommendations:   No orders of the defined types were placed in this encounter.   [name]  is a 61 y.o. AAF  with diabetes mellitus, hyperlipidemia, prior cigarette smoking quit in 2013, smokes marijuana occasionally, CAD  Presenting with NSTEMI, S/P stenting to mid right and also to the distal LAD by implantation of DES on 10/09/2019.   This is a 51-monthoffice visit, she is presently doing well and has not had any recurrence of angina pectoris.    Her diabetes is also well controlled now and she is trying to make lifestyle changes and has been losing weight.  Recently oral hypoglycemics have been discontinued and she is presently on Ozempic.  I have discussed with her regarding decreasing her volume of food and quality of food to be improved and the side effects of her abdominal bloating, sour eructations could be related to the Ozempic.  Advised  her to use Metamucil on a daily basis to regularize her bowel movements and to reduce effects of constipation.  Advised her to reduce the dose of the insulin if she indeed were to reduce food intake and follow-up with her PCP for the same.  She has not had any GI work-up, she needs routine colonoscopy but also due to sour eructations, long history of being on PPI, I have referred her to be evaluated by Dr. PCarol Ada  Lipids are under good control as well.  Blood pressure is also well controlled.  No changes were done  by me today, I will see her back in 1 year for follow-up.       Adrian Prows, MD, [facility] 09/24/2022, 3:58 PM Office: [phone]

## 2022-10-26 ENCOUNTER — Ambulatory Visit (INDEPENDENT_AMBULATORY_CARE_PROVIDER_SITE_OTHER): Payer: BC Managed Care – PPO | Admitting: Internal Medicine

## 2022-10-26 ENCOUNTER — Encounter: Payer: Self-pay | Admitting: Internal Medicine

## 2022-10-26 VITALS — BP 138/84 | HR 106 | Ht 64.0 in | Wt 173.2 lb

## 2022-10-26 DIAGNOSIS — E785 Hyperlipidemia, unspecified: Secondary | ICD-10-CM

## 2022-10-26 DIAGNOSIS — Z794 Long term (current) use of insulin: Secondary | ICD-10-CM

## 2022-10-26 DIAGNOSIS — E11319 Type 2 diabetes mellitus with unspecified diabetic retinopathy without macular edema: Secondary | ICD-10-CM | POA: Diagnosis not present

## 2022-10-26 DIAGNOSIS — E1169 Type 2 diabetes mellitus with other specified complication: Secondary | ICD-10-CM | POA: Diagnosis not present

## 2022-10-26 DIAGNOSIS — E669 Obesity, unspecified: Secondary | ICD-10-CM

## 2022-10-26 DIAGNOSIS — Z6832 Body mass index (BMI) 32.0-32.9, adult: Secondary | ICD-10-CM

## 2022-10-26 LAB — POCT GLYCOSYLATED HEMOGLOBIN (HGB A1C): Hemoglobin A1C: 6.2 % — AB (ref 4.0–5.6)

## 2022-10-26 NOTE — Addendum Note (Signed)
Addended by: Lauralyn Primes on: 10/26/2022 03:23 PM   Modules accepted: Orders

## 2022-10-26 NOTE — Patient Instructions (Addendum)
Please restart: - Metformin 2000 mg with dinner - Farxiga 10 mg before b'fast - Ozempic 1 mg weekly - Tresiba U200 32 units daily  Please decrease: - FiAsp: 8-11 units before b'fast 8-11 units before lunch 13-16 units before dinner  Please return in 3-4 months.

## 2022-10-26 NOTE — Progress Notes (Signed)
Patient ID: [name], female   DOB: [birth year], 61 y.o.   MRN: [id number]  HPI: [name] is a 61 y.o.-year-old female, returning for f/u for DM2, dx ~2000, insulin-dependent since 2010, uncontrolled, with complications (PN, + DR). Last visit 4 months ago. She is not seeing Dr. Derrel Nip anymore as she does not want to drive to [facility].  Interim history: She continues to have generalized aches and pains. At today's visit, she feels poorly and coughing. No increased urination, blurry vision, chest pain. She had severe sciatic back pain recently along with nausea/vomiting/abdominal pain (07/04/2022) - was admitted.  Lipase level was normal.  It was considered that the symptoms were related to smoking marijuana and also possibly her diabetes.  She Tyler Aas was decreased then.  She was also taken off Ozempic and metformin. We restarted these.  Sugars improved.  Reviewed HbA1c levels: Lab Results  Component Value Date   HGBA1C 7.8 (H) 07/04/2022   HGBA1C 6.5 (A) 04/10/2022   HGBA1C 7.7 (A) 11/10/2021   HGBA1C 7.3 (A) 01/24/2021   HGBA1C 7.1 (A) 09/13/2020   HGBA1C 6.4 (A) 05/13/2020   HGBA1C 7.6 (A) 08/31/2019   HGBA1C 7.8 (A) 11/25/2018   HGBA1C 6.8 (A) 07/25/2018   HGBA1C 6.4% 03/03/2018   HGBA1C 6.5 11/01/2017   HGBA1C 6.2 07/23/2017   HGBA1C 6.8 04/09/2017   HGBA1C 6.4 01/08/2017   HGBA1C 6.4 10/08/2016   HGBA1C 10.4 07/07/2016   HGBA1C 10.4 04/07/2016   HGBA1C 9.3 01/14/2016   HGBA1C 10.9 (H) 09/25/2015   HGBA1C 11.2 09/19/2015   HGBA1C 7.7 06/21/2015   HGBA1C 7.9 (H) 03/22/2015   HGBA1C 9.4 (H) 01/04/2015   HGBA1C 8.8 (H) 10/04/2014   HGBA1C 10.3 (H) 07/06/2014   HGBA1C 7.0 (H) 03/26/2014   HGBA1C 9.5 (H) 12/26/2013   HGBA1C 7.5 (H) 09/20/2013   HGBA1C 8.5 (H) 06/19/2013   HGBA1C 10.3 (H) 01/31/2013   Pt was on:  Insulin Before breakfast Before lunch Before dinner  R 10 10 (6 units if active after the meal) 20-25  NPH 15 - 20  Also: - Metformin 1000 mg twice  a day  At last visit, I recommended to change to: - Tresiba U200 30 >> 25 units daily - FiAsp 6-10 units before b'fast and lunch and 18-20 units before dinner >> misunderstood instructions: taking 34 units at 9 am (before b'fast) - Ozempic 1 mg weekly - - Metformin ER 1000 mg with dinner - Farxiga 10 mg before b'fast  Pt is checking her sugars >4x a day with her CGM:  Prev.   Lowest sugar was 29 (?) in 11/2020 >> 40s x1 >> 35 (skipped the meal) >> 63 >> 53 in am; she has hypoglycemia awareness in the 80s. Highest sugar was  497 ...>> 300s >> 400 >> 200s..  -No CKD, last BUN/creatinine:  Lab Results  Component Value Date   BUN 10 07/06/2022   CREATININE 0.60 07/06/2022   ACR was normal: Lab Results  Component Value Date   MICRALBCREAT 3 01/24/2021   MICRALBCREAT 1.8 06/27/2018   MICRALBCREAT 1.6 06/23/2017   MICRALBCREAT 0.7 06/19/2016   MICRALBCREAT 0.4 09/25/2015   MICRALBCREAT 0.3 07/30/2014   MICRALBCREAT 0.5 06/19/2013   MICRALBCREAT 1.5 01/31/2013   MICRALBCREAT 2.6 11/20/2011  Not on ACE inhibitor/ARB  -+ HL; last set of lipids: Lab Results  Component Value Date   CHOL 124 01/24/2021   HDL 51 01/24/2021   LDLCALC 55 01/24/2021   LDLDIRECT 94.0 09/25/2015   TRIG  99 01/24/2021   CHOLHDL 2.4 01/24/2021  On Lipitor 40, Zetia 10.  - last eye exam was in 11/2021: + DR OU, reportedly glaucoma. She sees Dr. Arnoldo Morale.    - + numbness and tingling in her feet.  She is on Neurontin.  She sees podiatry.  Last foot exam was checked by Dr. Prudence Davidson 09/02/2022.  She also has a history of HTN, episodic alcohol abuse, GERD, low back pain, OA.  She went to the emergency room 06/28/2021 for nausea, vomiting, and abdominal pain.  Her lipase was high.  Ozempic was stopped. She was also in the ED post a fall down the stairs 08/2021. L forearm was in a splint >> saw ortho  ROS: + see HPI  I reviewed pt's medications, allergies, PMH, social hx, family hx, and changes were  documented in the history of present illness. Otherwise, unchanged from my initial visit note.  Past Medical History:  Diagnosis Date   Arthritis    Campylobacter enteritis May 2012   [facility] admission   Carpal tunnel syndrome, bilateral    Diabetes mellitus    Gastritis with bleeding due to alcohol 2010   [facility] admission   GERD (gastroesophageal reflux disease)    Hyperlipidemia    Hypertension    no meds   Menopause    [facility])    Neck pain    Wears glasses    Past Surgical History:  Procedure Laterality Date   CARPAL TUNNEL RELEASE Left 05/14/2014   Procedure: LEFT ULNAR NEUROPLASTY AT ELBOW AND ENDOSCOPIC CARPAL TUNNEL RELEASE;  Surgeon: Jolyn Nap, MD;  Location: [place];  Service: Orthopedics;  Laterality: Left;   CORONARY STENT INTERVENTION N/A 10/09/2019   Procedure: CORONARY STENT INTERVENTION;  Surgeon: Adrian Prows, MD;  Location: [place] [facility];  Service: Cardiovascular;  Laterality: N/A;   CYST EXCISION  1995   under arms   endometrial biopsy  2004   benign   FOOT OSTEOTOMY  2010   right-spurs   LEFT HEART CATH AND CORONARY ANGIOGRAPHY N/A 10/09/2019   Procedure: LEFT HEART CATH AND CORONARY ANGIOGRAPHY;  Surgeon: Adrian Prows, MD;  Location: [place] [facility];  Service: Cardiovascular;  Laterality: N/A;   TONSILLECTOMY     ULNAR NERVE TRANSPOSITION Left 05/14/2014   Procedure: ULNAR NERVE DECOMPRESSION/TRANSPOSITION;  Surgeon: Jolyn Nap, MD;  Location: [place];  Service: Orthopedics;  Laterality: Left;   Social History   Socioeconomic History   Marital status: Single    Spouse name: Not on file   Number of children: 2   Years of education: 12   Highest education level: High school graduate  Occupational History   Not on file  Tobacco Use   Smoking status: Never   Smokeless tobacco: Never  Vaping Use   Vaping Use: Never used  Substance and Sexual Activity   Alcohol use: Not Currently    Drug use: Yes    Types: Marijuana    Comment: sometimes   Sexual activity: Yes    Birth control/protection: None    Comment: same partner x 10 years  Other Topics Concern   Not on file  Social History Narrative   She works as a Sports coach.   Right-handed.   One cup caffeine per day.   She lives at home with daughter.   Social Determinants of Health   Financial Resource Strain: Not on file  Food Insecurity: Not on file  Transportation Needs: Not on file  Physical Activity: Not on file  Stress: Not on file  Social Connections: Not on file  Intimate Partner Violence: Not on file   Current Outpatient Medications on File Prior to Visit  Medication Sig Dispense Refill   aspirin 81 MG chewable tablet Chew 1 tablet (81 mg total) by mouth daily.     atorvastatin (LIPITOR) 40 MG tablet Take 1 tablet (40 mg total) by mouth daily. (Patient not taking: Reported on 09/24/2022) 90 tablet 3   Continuous Blood Gluc Sensor (FREESTYLE LIBRE 3 SENSOR) MISC 1 each by Does not apply route every 14 (fourteen) days. 6 each 3   FARXIGA 10 MG TABS tablet Take 10 mg by mouth daily.     gabapentin (NEURONTIN) 800 MG tablet Take 800 mg by mouth 3 (three) times daily.     glucose blood (BAYER CONTOUR TEST) test strip TEST BLOOD SUGARS 3 TIMES DAILY 200 each 11   hydrOXYzine (ATARAX) 25 MG tablet Take 25 mg by mouth 3 (three) times daily as needed. itching     insulin aspart (FIASP FLEXTOUCH) 100 UNIT/ML FlexTouch Pen Inject 6-20 Units into the skin 3 (three) times daily before meals. 30 mL 3   Insulin Pen Needle 32G X 4 MM MISC Use 4x a day 300 each 3   Lancet Devices (EASY TOUCH LANCING DEVICE) MISC Use to check diabetes 4 times daily  E 11.65 1 each 11   latanoprost (XALATAN) 0.005 % ophthalmic solution Place 1 drop into both eyes at bedtime.     lidocaine (XYLOCAINE) 5 % ointment Apply 1 application. topically as needed. (Patient taking differently: Apply 1 application  topically daily as needed for mild  pain.) 35.44 g 0   methocarbamol (ROBAXIN) 500 MG tablet Take 1 tablet (500 mg total) by mouth every 8 (eight) hours as needed for muscle spasms. 30 tablet 0   metoprolol succinate (TOPROL-XL) 50 MG 24 hr tablet TAKE 1 TABLET BY MOUTH ONCE DAILY. TAKE WITH OR IMMEDIATELY FOLLOWING A MEAL (Patient taking differently: Take 50 mg by mouth daily.) 30 tablet 3   Multiple Vitamin (MULTIVITAMIN) tablet Take 1 tablet by mouth daily.     naloxone (NARCAN) nasal spray 4 mg/0.1 mL Place 1 spray into the nose as needed.     oxyCODONE-acetaminophen (PERCOCET) 7.5-325 MG tablet Take 1 tablet by mouth 4 (four) times daily as needed.     OZEMPIC, 1 MG/DOSE, 4 MG/3ML SOPN Inject 1 mg into the skin once a week. Saturday of each week     pantoprazole (PROTONIX) 40 MG tablet Take 1 tablet (40 mg total) by mouth 2 (two) times daily. 60 tablet 0   solifenacin (VESICARE) 5 MG tablet Take 5 mg by mouth daily.     TRESIBA FLEXTOUCH 200 UNIT/ML FlexTouch Pen INJECT 30 TO 34 UNITS SUBCUTANEOUSLY ONCE DAILY (Patient taking differently: 25 Units in the morning.) 9 mL 0   triamcinolone cream (KENALOG) 0.1 % Apply 1 Application topically 3 (three) times daily as needed (itching).     vitamin B-12 (CYANOCOBALAMIN) 1000 MCG tablet Take 1,000 mcg by mouth daily.     No current facility-administered medications on file prior to visit.   Allergies  Allergen Reactions   Oxycodone-Acetaminophen Other (See Comments)    Causes her to feel "hot".   Penicillins Rash    Did it involve swelling of the face/tongue/throat, SOB, or low BP? No Did it involve sudden or severe rash/hives, skin peeling, or any reaction on the inside of your mouth or nose?  No Did you need to seek medical attention at a hospital or doctor's office? No When did it last happen?       If all above answers are "NO", may proceed with cephalosporin use.   Family History  Problem Relation Age of Onset   Hypertension Mother    Diabetes Mother    Heart attack  Mother    Stroke Father    Diabetes Father    Diabetes Maternal Grandmother    Hypertension Maternal Grandmother    Heart attack Sister    PE: BP 138/84 (BP Location: Right Arm, Patient Position: Sitting, Cuff Size: Normal)   Pulse (!) 106   Ht '5\' 4"'$  (1.626 m)   Wt 173 lb 3.2 oz (78.6 kg)   SpO2 96%   BMI 29.73 kg/m    Wt Readings from Last 3 Encounters:  10/26/22 173 lb 3.2 oz (78.6 kg)  09/24/22 179 lb (81.2 kg)  09/09/22 188 lb (85.3 kg)   Constitutional: overweight, coughing Eyes: no exophthalmos ENT: no masses palpated in neck, no cervical lymphadenopathy Cardiovascular: Tachycardia, RR, No MRG Respiratory: CTA B Musculoskeletal: no deformities Skin: moist, warm, no rashes Neurological: no tremor with outstretched hands  ASSESSMENT: 1. DM2, insulin-dependent, uncontrolled, with complications - peripheral neuropathy - DR   2. Obesity class 1 - BMI 32.79  BMI Classification: < 18.5 underweight  18.5-24.9 normal weight  25.0-29.9 overweight  30.0-34.9 class I obesity  35.0-39.9 class II obesity  ? 40.0 class III obesity   3. HL  PLAN:  1. Patient with longstanding, uncontrolled, type 2 diabetes, with previously improved control but much worse control at last visit, with an HbA1c was 7.8%, increased from 6.5% (however, HbA1c predicted CGM tracings for the previous 2 weeks were 9%).  At that time, reviewing the CGM traces, sugars were much higher than before, fluctuating usually above the upper limit of the target range.  Upon questioning, she was taking lower dose of Tresiba at the recommended as she was not using Fiasp correctly.  Also, she was off metformin and Ozempic.  She had nausea/vomiting/abdominal pain in the months prior to the visit but upon questioning in reviewing the hospital chart, this could have been related to her marijuana smoking.  She absolutely wanted to retry Ozempic at that time.  We discussed that insulin in the third time to try Ozempic but she  was adamant that [place] did not cause her symptoms.  I advised her to let me know if she develops any more nausea and vomiting but agreed to restart it.  I also advised her to restart metformin. CGM interpretation: -At today's visit, we reviewed her CGM downloads: It appears that 86% of values are in target range (goal >70%), while 7% are higher than 180 (goal <25%), and 7% are lower than 70 (goal <4%).  The calculated average blood sugar is 123.  -Reviewing the CGM trends, sugars appear to be fluctuating within the target range, but she has quite a few mild lows, in the 50s and 60s.  We discussed about the need to decrease Fiasp doses with each meal, since the sugars could be low at different times of the day.  For now, we will continue the rest of the regimen. - I advised her to: Patient Instructions  Please restart: - Metformin 2000 mg with dinner - Farxiga 10 mg before b'fast - Ozempic 1 mg weekly - Tresiba U200 32 units daily  Please decrease: - FiAsp: 8-11 units before b'fast 8-11 units before  lunch 13-16 units before dinner  Please return in 3-4 months.  - we checked her HbA1c: 6.2% (lower) - advised to check sugars at different times of the day - 4x a day, rotating check times - advised for yearly eye exams >> she is UTD - return to clinic in 3-4 months  2. Obesity class 1 -We tried Ozempic in the past but she had to come off due to nausea/vomiting/abdominal pain and a high lipase in 05/2021.  However, she really wanted to try this again.  After a discussion about benefits and possible side effects and possible alarm symptoms, we restarted Ozempic at last visit -She gained 7 pounds before last visit -She lost 17 pounds since last visit!  3. HL -Reviewed latest lipid panel from 12/2020: Fractions at goal: Lab Results  Component Value Date   CHOL 124 01/24/2021   HDL 51 01/24/2021   LDLCALC 55 01/24/2021   LDLDIRECT 94.0 09/25/2015   TRIG 99 01/24/2021   CHOLHDL 2.4  01/24/2021  -She is on Lipitor 40 mg daily and Zetia 10 mg daily without side effects  Philemon Kingdom, MD PhD [facility]

## 2022-11-09 ENCOUNTER — Ambulatory Visit (INDEPENDENT_AMBULATORY_CARE_PROVIDER_SITE_OTHER): Payer: BC Managed Care – PPO | Admitting: Orthopaedic Surgery

## 2022-11-09 ENCOUNTER — Encounter: Payer: Self-pay | Admitting: Orthopaedic Surgery

## 2022-11-09 ENCOUNTER — Ambulatory Visit (INDEPENDENT_AMBULATORY_CARE_PROVIDER_SITE_OTHER): Payer: BC Managed Care – PPO

## 2022-11-09 DIAGNOSIS — G8929 Other chronic pain: Secondary | ICD-10-CM

## 2022-11-09 DIAGNOSIS — M25571 Pain in right ankle and joints of right foot: Secondary | ICD-10-CM

## 2022-11-09 DIAGNOSIS — M25562 Pain in left knee: Secondary | ICD-10-CM | POA: Diagnosis not present

## 2022-11-09 DIAGNOSIS — M1712 Unilateral primary osteoarthritis, left knee: Secondary | ICD-10-CM

## 2022-11-09 NOTE — Progress Notes (Signed)
The patient comes in today to again discuss knee replacement surgery for her left knee.  At her last visit a few months ago her hemoglobin A1c was 7.8.  She knew that she had to get that lower to qualify for surgery.  Her hemoglobin A1c is now down to 6.2 recently.  She said her left knee pain comes and goes but she still interested in knee replacement surgery.  I have shown her knee replacement model and we went over her x-rays again of her left knee showing the significant arthritis of the left knee.  Her right ankle has been hurting.  She has a remote history of a fracture that did not require surgery.  Her x-ray of her right ankle shows significant arthritis of the ankle joint.  I believe that is what is bothering her the most in terms of her ankle.  I showed her those x-rays and explained to her that she does have posttraumatic arthritis of the right ankle joint.  On examination of her right ankle she has good range of motion of the ankle with some global tenderness but no swelling that I can see and no incompetency of the ligaments or tendons.  The Achilles is also intact but she is tender around the ankle in general.  Some of this I believe is from her ankle arthritis and some of this is from compensation from offloading her left knee.  Her left knee does show varus malalignment that is correctable as well as medial joint line tenderness and patellofemoral tenderness.  At this point we will try an ASO for her right ankle.  She would like to be scheduled for knee replacement in March 2024 so we will work on getting that scheduled.  All questions and concerns were answered and addressed.

## 2022-11-10 ENCOUNTER — Encounter: Payer: Self-pay | Admitting: Podiatry

## 2022-11-10 ENCOUNTER — Ambulatory Visit (INDEPENDENT_AMBULATORY_CARE_PROVIDER_SITE_OTHER): Payer: BC Managed Care – PPO | Admitting: Podiatry

## 2022-11-10 DIAGNOSIS — E1140 Type 2 diabetes mellitus with diabetic neuropathy, unspecified: Secondary | ICD-10-CM | POA: Diagnosis not present

## 2022-11-10 DIAGNOSIS — M79674 Pain in right toe(s): Secondary | ICD-10-CM | POA: Diagnosis not present

## 2022-11-10 DIAGNOSIS — M79675 Pain in left toe(s): Secondary | ICD-10-CM | POA: Diagnosis not present

## 2022-11-10 DIAGNOSIS — B351 Tinea unguium: Secondary | ICD-10-CM | POA: Diagnosis not present

## 2022-11-10 NOTE — Progress Notes (Signed)
This patient returns to my office for at risk foot care.  This patient requires this care by a professional since this patient will be at risk due to having diabetic neuropathy.  This patient is unable to cut nails herself since the patient cannot reach her nails.These nails are painful walking and wearing shoes.  This patient presents for at risk foot care today.  General Appearance  Alert, conversant and in no acute stress.  Vascular  Dorsalis pedis and posterior tibial  pulses are palpable  bilaterally.  Capillary return is within normal limits  bilaterally. Temperature is within normal limits  bilaterally.  Neurologic  Senn-Weinstein monofilament wire test diminished  bilaterally. Muscle power within normal limits bilaterally.  Nails Thick disfigured discolored nails with subungual debris  from hallux to fifth toes bilaterally. No evidence of bacterial infection or drainage bilaterally.  Orthopedic  No limitations of motion  feet .  No crepitus or effusions noted.  No bony pathology or digital deformities noted.  Skin  normotropic skin with no porokeratosis noted bilaterally.  No signs of infections or ulcers noted.     Onychomycosis  Pain in right toes  Pain in left toes  Consent was obtained for treatment procedures.   Mechanical debridement of nails 1-5  bilaterally performed with a nail nipper.  Filed with dremel without incident.    Return office visit   9 weeks                  Told patient to return for periodic foot care and evaluation due to potential at risk complications.   Ayushi Pla DPM   

## 2022-11-12 ENCOUNTER — Encounter: Payer: Self-pay | Admitting: Family Medicine

## 2022-11-12 ENCOUNTER — Ambulatory Visit (INDEPENDENT_AMBULATORY_CARE_PROVIDER_SITE_OTHER): Payer: BC Managed Care – PPO | Admitting: Family Medicine

## 2022-11-12 VITALS — BP 150/92 | HR 84 | Temp 98.1°F | Resp 16 | Ht 64.0 in | Wt 171.2 lb

## 2022-11-12 DIAGNOSIS — I10 Essential (primary) hypertension: Secondary | ICD-10-CM

## 2022-11-12 DIAGNOSIS — E785 Hyperlipidemia, unspecified: Secondary | ICD-10-CM | POA: Diagnosis not present

## 2022-11-12 DIAGNOSIS — Z7689 Persons encountering health services in other specified circumstances: Secondary | ICD-10-CM

## 2022-11-12 DIAGNOSIS — R1013 Epigastric pain: Secondary | ICD-10-CM | POA: Diagnosis not present

## 2022-11-12 DIAGNOSIS — E1169 Type 2 diabetes mellitus with other specified complication: Secondary | ICD-10-CM | POA: Diagnosis not present

## 2022-11-12 DIAGNOSIS — Z1211 Encounter for screening for malignant neoplasm of colon: Secondary | ICD-10-CM

## 2022-11-12 DIAGNOSIS — Z794 Long term (current) use of insulin: Secondary | ICD-10-CM

## 2022-11-12 NOTE — Progress Notes (Signed)
Patient is here to established care with provider today. Patient has many health concern they would like to discuss with provider today  Care gaps discuss at appointment today  

## 2022-11-12 NOTE — Progress Notes (Signed)
New Patient Office Visit  Subjective    Patient ID: [name], female    DOB: 06-Oct-1961  Age: 61 y.o. MRN: [id number]  CC:  Chief Complaint  Patient presents with   Establish Care    HPI [name] presents to establish care and for review of chronic med issues. Patient reports some persistent intermittent stomach discomfort.   Outpatient Encounter Medications as of 11/12/2022  Medication Sig   aspirin 81 MG chewable tablet Chew 1 tablet (81 mg total) by mouth daily.   atorvastatin (LIPITOR) 40 MG tablet Take 1 tablet (40 mg total) by mouth daily. (Patient not taking: Reported on 09/24/2022)   Continuous Blood Gluc Sensor (FREESTYLE LIBRE 3 SENSOR) MISC 1 each by Does not apply route every 14 (fourteen) days.   FARXIGA 10 MG TABS tablet Take 10 mg by mouth daily.   gabapentin (NEURONTIN) 800 MG tablet Take 800 mg by mouth 3 (three) times daily.   glucose blood (BAYER CONTOUR TEST) test strip TEST BLOOD SUGARS 3 TIMES DAILY   hydrOXYzine (ATARAX) 25 MG tablet Take 25 mg by mouth 3 (three) times daily as needed. itching   insulin aspart (FIASP FLEXTOUCH) 100 UNIT/ML FlexTouch Pen Inject 6-20 Units into the skin 3 (three) times daily before meals.   Insulin Pen Needle 32G X 4 MM MISC Use 4x a day   Lancet Devices (EASY TOUCH LANCING DEVICE) MISC Use to check diabetes 4 times daily  E 11.65   latanoprost (XALATAN) 0.005 % ophthalmic solution Place 1 drop into both eyes at bedtime.   lidocaine (XYLOCAINE) 5 % ointment Apply 1 application. topically as needed. (Patient taking differently: Apply 1 application  topically daily as needed for mild pain.)   methocarbamol (ROBAXIN) 500 MG tablet Take 1 tablet (500 mg total) by mouth every 8 (eight) hours as needed for muscle spasms.   metoprolol succinate (TOPROL-XL) 50 MG 24 hr tablet TAKE 1 TABLET BY MOUTH ONCE DAILY. TAKE WITH OR IMMEDIATELY FOLLOWING A MEAL (Patient taking differently: Take 50 mg by mouth daily.)   Multiple  Vitamin (MULTIVITAMIN) tablet Take 1 tablet by mouth daily.   naloxone (NARCAN) nasal spray 4 mg/0.1 mL Place 1 spray into the nose as needed.   oxyCODONE-acetaminophen (PERCOCET) 7.5-325 MG tablet Take 1 tablet by mouth 4 (four) times daily as needed.   OZEMPIC, 1 MG/DOSE, 4 MG/3ML SOPN Inject 1 mg into the skin once a week. Saturday of each week   pantoprazole (PROTONIX) 40 MG tablet Take 1 tablet (40 mg total) by mouth 2 (two) times daily.   solifenacin (VESICARE) 5 MG tablet Take 5 mg by mouth daily.   TRESIBA FLEXTOUCH 200 UNIT/ML FlexTouch Pen INJECT 30 TO 34 UNITS SUBCUTANEOUSLY ONCE DAILY (Patient taking differently: 25 Units in the morning.)   triamcinolone cream (KENALOG) 0.1 % Apply 1 Application topically 3 (three) times daily as needed (itching).   vitamin B-12 (CYANOCOBALAMIN) 1000 MCG tablet Take 1,000 mcg by mouth daily.   No facility-administered encounter medications on file as of 11/12/2022.    Past Medical History:  Diagnosis Date   Arthritis    Campylobacter enteritis May 2012   [facility] admission   Carpal tunnel syndrome, bilateral    Diabetes mellitus    Gastritis with bleeding due to alcohol 2010   [facility] admission   GERD (gastroesophageal reflux disease)    Hyperlipidemia    Hypertension    no meds   Menopause    Myocardial infarction ([place])  Neck pain    Wears glasses     Past Surgical History:  Procedure Laterality Date   CARPAL TUNNEL RELEASE Left 05/14/2014   Procedure: LEFT ULNAR NEUROPLASTY AT ELBOW AND ENDOSCOPIC CARPAL TUNNEL RELEASE;  Surgeon: Jolyn Nap, MD;  Location: [place];  Service: Orthopedics;  Laterality: Left;   CORONARY STENT INTERVENTION N/A 10/09/2019   Procedure: CORONARY STENT INTERVENTION;  Surgeon: Adrian Prows, MD;  Location: [place] [facility];  Service: Cardiovascular;  Laterality: N/A;   CYST EXCISION  1995   under arms   endometrial biopsy  2004   benign   FOOT OSTEOTOMY  2010   right-spurs   LEFT HEART  CATH AND CORONARY ANGIOGRAPHY N/A 10/09/2019   Procedure: LEFT HEART CATH AND CORONARY ANGIOGRAPHY;  Surgeon: Adrian Prows, MD;  Location: [place] [facility];  Service: Cardiovascular;  Laterality: N/A;   TONSILLECTOMY     ULNAR NERVE TRANSPOSITION Left 05/14/2014   Procedure: ULNAR NERVE DECOMPRESSION/TRANSPOSITION;  Surgeon: Jolyn Nap, MD;  Location: [place];  Service: Orthopedics;  Laterality: Left;    Family History  Problem Relation Age of Onset   Hypertension Mother    Diabetes Mother    Heart attack Mother    Stroke Father    Diabetes Father    Diabetes Maternal Grandmother    Hypertension Maternal Grandmother    Heart attack Sister     Social History   Socioeconomic History   Marital status: Single    Spouse name: Not on file   Number of children: 2   Years of education: 12   Highest education level: High school graduate  Occupational History   Not on file  Tobacco Use   Smoking status: Never   Smokeless tobacco: Never  Vaping Use   Vaping Use: Never used  Substance and Sexual Activity   Alcohol use: Not Currently   Drug use: Yes    Types: Marijuana    Comment: sometimes   Sexual activity: Yes    Birth control/protection: None    Comment: same partner x 10 years  Other Topics Concern   Not on file  Social History Narrative   She works as a Sports coach.   Right-handed.   One cup caffeine per day.   She lives at home with daughter.   Social Determinants of Health   Financial Resource Strain: Not on file  Food Insecurity: Not on file  Transportation Needs: Not on file  Physical Activity: Not on file  Stress: Not on file  Social Connections: Not on file  Intimate Partner Violence: Not on file    Review of Systems  All other systems reviewed and are negative.       Objective    BP (!) 150/92   Pulse 84   Temp 98.1 F (36.7 C) (Oral)   Resp 16   Ht '5\' 4"'$  (1.626 m)   Wt 171 lb 3.2 oz (77.7 kg)   SpO2 97%   BMI 29.39  kg/m   Physical Exam Vitals and nursing note reviewed.  Constitutional:      General: She is not in acute distress. Cardiovascular:     Rate and Rhythm: Normal rate and regular rhythm.  Pulmonary:     Effort: Pulmonary effort is normal.     Breath sounds: Normal breath sounds.  Abdominal:     Palpations: Abdomen is soft.     Tenderness: There is no abdominal tenderness.  Neurological:     General:  No focal deficit present.     Mental Status: She is alert and oriented to person, place, and time.         Assessment & Plan:   \1. Type 2 diabetes mellitus with other specified complication, with long-term current use of insulin ([place]) Management as per consultant. - Microalbumin / creatinine urine ratio  2. Essential hypertension Management as per consultant  3. Hyperlipidemia associated with type 2 diabetes mellitus ([place]) Continue as above  4. Dyspepsia Referral to GI for further eval/mgt  5. Screening for colon cancer As above  6. Encounter to establish care       No follow-ups on file.   Becky Sax, MD

## 2022-11-16 ENCOUNTER — Encounter: Payer: Self-pay | Admitting: Family Medicine

## 2022-11-20 LAB — MICROALBUMIN / CREATININE URINE RATIO
Creatinine, Urine: 67.5 mg/dL
Microalb/Creat Ratio: <4 mg/g creat (ref 0–29)
Microalbumin, Urine: <3 ug/mL

## 2023-01-12 ENCOUNTER — Other Ambulatory Visit: Payer: Self-pay

## 2023-01-20 ENCOUNTER — Ambulatory Visit (INDEPENDENT_AMBULATORY_CARE_PROVIDER_SITE_OTHER): Payer: BC Managed Care – PPO | Admitting: Podiatry

## 2023-01-20 ENCOUNTER — Encounter: Payer: Self-pay | Admitting: Podiatry

## 2023-01-20 DIAGNOSIS — M79675 Pain in left toe(s): Secondary | ICD-10-CM | POA: Diagnosis not present

## 2023-01-20 DIAGNOSIS — B351 Tinea unguium: Secondary | ICD-10-CM

## 2023-01-20 DIAGNOSIS — M79674 Pain in right toe(s): Secondary | ICD-10-CM | POA: Diagnosis not present

## 2023-01-20 DIAGNOSIS — E1140 Type 2 diabetes mellitus with diabetic neuropathy, unspecified: Secondary | ICD-10-CM | POA: Diagnosis not present

## 2023-01-20 NOTE — Progress Notes (Signed)
This patient returns to my office for at risk foot care.  This patient requires this care by a professional since this patient will be at risk due to having diabetic neuropathy.  This patient is unable to cut nails herself since the patient cannot reach her nails.These nails are painful walking and wearing shoes.  This patient presents for at risk foot care today.  General Appearance  Alert, conversant and in no acute stress.  Vascular  Dorsalis pedis and posterior tibial  pulses are palpable  bilaterally.  Capillary return is within normal limits  bilaterally. Temperature is within normal limits  bilaterally.  Neurologic  Senn-Weinstein monofilament wire test diminished  bilaterally. Muscle power within normal limits bilaterally.  Nails Thick disfigured discolored nails with subungual debris  from hallux to fifth toes bilaterally. No evidence of bacterial infection or drainage bilaterally.  Orthopedic  No limitations of motion  feet .  No crepitus or effusions noted.  No bony pathology or digital deformities noted.  Skin  normotropic skin with no porokeratosis noted bilaterally.  No signs of infections or ulcers noted.     Onychomycosis  Pain in right toes  Pain in left toes  Consent was obtained for treatment procedures.   Mechanical debridement of nails 1-5  bilaterally performed with a nail nipper.  Filed with dremel without incident.    Return office visit   9 weeks                  Told patient to return for periodic foot care and evaluation due to potential at risk complications.   Gardiner Barefoot DPM

## 2023-02-04 ENCOUNTER — Other Ambulatory Visit: Payer: Self-pay | Admitting: Physician Assistant

## 2023-02-04 ENCOUNTER — Encounter: Payer: Self-pay | Admitting: Radiology

## 2023-02-04 DIAGNOSIS — Z01818 Encounter for other preprocedural examination: Secondary | ICD-10-CM

## 2023-02-06 ENCOUNTER — Other Ambulatory Visit: Payer: Self-pay | Admitting: Internal Medicine

## 2023-02-09 ENCOUNTER — Ambulatory Visit: Payer: BC Managed Care – PPO | Admitting: Podiatry

## 2023-02-11 ENCOUNTER — Ambulatory Visit (INDEPENDENT_AMBULATORY_CARE_PROVIDER_SITE_OTHER): Payer: BC Managed Care – PPO | Admitting: Family Medicine

## 2023-02-11 ENCOUNTER — Encounter: Payer: Self-pay | Admitting: Family Medicine

## 2023-02-11 VITALS — BP 130/83 | HR 70 | Temp 98.1°F | Resp 16 | Wt 165.0 lb

## 2023-02-11 DIAGNOSIS — E1169 Type 2 diabetes mellitus with other specified complication: Secondary | ICD-10-CM | POA: Diagnosis not present

## 2023-02-11 DIAGNOSIS — Z1211 Encounter for screening for malignant neoplasm of colon: Secondary | ICD-10-CM | POA: Diagnosis not present

## 2023-02-11 DIAGNOSIS — R1013 Epigastric pain: Secondary | ICD-10-CM | POA: Diagnosis not present

## 2023-02-11 DIAGNOSIS — Z794 Long term (current) use of insulin: Secondary | ICD-10-CM

## 2023-02-11 DIAGNOSIS — I10 Essential (primary) hypertension: Secondary | ICD-10-CM | POA: Diagnosis not present

## 2023-02-11 NOTE — Progress Notes (Signed)
Established Patient Office Visit  Subjective    Patient ID: [name], female    DOB: 1961/07/12  Age: 62 y.o. MRN: [id number]  CC: No chief complaint on file.   HPI [name] presents for routine follow up of chronic med issues. Patient denies acute complaints or concerns.    Outpatient Encounter Medications as of 02/11/2023  Medication Sig   Ascorbic Acid (VITAMIN C PO) Take 1 tablet by mouth daily.   aspirin 81 MG chewable tablet Chew 1 tablet (81 mg total) by mouth daily.   atorvastatin (LIPITOR) 40 MG tablet Take 1 tablet (40 mg total) by mouth daily. (Patient not taking: Reported on 02/11/2023)   Continuous Blood Gluc Sensor (FREESTYLE LIBRE 3 SENSOR) MISC 1 each by Does not apply route every 14 (fourteen) days.   FARXIGA 10 MG TABS tablet Take 10 mg by mouth daily.   gabapentin (NEURONTIN) 600 MG tablet Take 600 mg by mouth 3 (three) times daily.   glucose blood (BAYER CONTOUR TEST) test strip TEST BLOOD SUGARS 3 TIMES DAILY   hydrOXYzine (ATARAX) 25 MG tablet Take 25 mg by mouth 3 (three) times daily as needed. itching (Patient not taking: Reported on 02/11/2023)   insulin aspart (FIASP FLEXTOUCH) 100 UNIT/ML FlexTouch Pen Inject 6-20 Units into the skin 3 (three) times daily before meals.   Insulin Pen Needle 32G X 4 MM MISC Use 4x a day   Lancet Devices (EASY TOUCH LANCING DEVICE) MISC Use to check diabetes 4 times daily  E 11.65   latanoprost (XALATAN) 0.005 % ophthalmic solution Place 1 drop into both eyes at bedtime.   lidocaine (XYLOCAINE) 5 % ointment Apply 1 application. topically as needed. (Patient not taking: Reported on 02/11/2023)   methocarbamol (ROBAXIN) 500 MG tablet Take 1 tablet (500 mg total) by mouth every 8 (eight) hours as needed for muscle spasms. (Patient not taking: Reported on 02/11/2023)   metoprolol succinate (TOPROL-XL) 50 MG 24 hr tablet TAKE 1 TABLET BY MOUTH ONCE DAILY. TAKE WITH OR IMMEDIATELY FOLLOWING A MEAL (Patient not taking: Reported  on 02/11/2023)   naloxone (NARCAN) nasal spray 4 mg/0.1 mL Place 1 spray into the nose as needed (or).   oxyCODONE-acetaminophen (PERCOCET) 7.5-325 MG tablet Take 1 tablet by mouth every 8 (eight) hours as needed for moderate pain.   OZEMPIC, 2 MG/DOSE, 8 MG/3ML SOPN Inject 2 mg into the skin once a week. Saturday   pantoprazole (PROTONIX) 40 MG tablet Take 1 tablet (40 mg total) by mouth 2 (two) times daily.   solifenacin (VESICARE) 5 MG tablet Take 5 mg by mouth daily. (Patient not taking: Reported on 02/11/2023)   TRESIBA FLEXTOUCH 200 UNIT/ML FlexTouch Pen INJECT 30 TO 34 UNITS SUBCUTANEOUSLY ONCE DAILY (Patient taking differently: Inject 8-16 Units into the skin See admin instructions. Take 8 units in the morning and at lunch and 16 units at bedtime)   triamcinolone cream (KENALOG) 0.1 % Apply 1 Application topically 3 (three) times daily as needed (itching).   vitamin B-12 (CYANOCOBALAMIN) 1000 MCG tablet Take 1,000 mcg by mouth daily.   No facility-administered encounter medications on file as of 02/11/2023.    Past Medical History:  Diagnosis Date   Arthritis    Campylobacter enteritis May 2012   [facility] admission   Carpal tunnel syndrome, bilateral    Diabetes mellitus    Gastritis with bleeding due to alcohol 2010   [facility] admission   GERD (gastroesophageal reflux disease)    Hyperlipidemia  Hypertension    no meds   Menopause    Myocardial infarction [facility])    Neck pain    Wears glasses     Past Surgical History:  Procedure Laterality Date   CARPAL TUNNEL RELEASE Left 05/14/2014   Procedure: LEFT ULNAR NEUROPLASTY AT ELBOW AND ENDOSCOPIC CARPAL TUNNEL RELEASE;  Surgeon: Wendy Nap, MD;  Location: [place];  Service: Orthopedics;  Laterality: Left;   CORONARY STENT INTERVENTION N/A 10/09/2019   Procedure: CORONARY STENT INTERVENTION;  Surgeon: Wendy Prows, MD;  Location: [place] CV LAB;  Service: Cardiovascular;  Laterality: N/A;   CYST EXCISION  1995    under arms   endometrial biopsy  2004   benign   FOOT OSTEOTOMY  2010   right-spurs   LEFT HEART CATH AND CORONARY ANGIOGRAPHY N/A 10/09/2019   Procedure: LEFT HEART CATH AND CORONARY ANGIOGRAPHY;  Surgeon: Wendy Prows, MD;  Location: [place] [facility];  Service: Cardiovascular;  Laterality: N/A;   TONSILLECTOMY     ULNAR NERVE TRANSPOSITION Left 05/14/2014   Procedure: ULNAR NERVE DECOMPRESSION/TRANSPOSITION;  Surgeon: Wendy Nap, MD;  Location: [place];  Service: Orthopedics;  Laterality: Left;    Family History  Problem Relation Age of Onset   Hypertension Mother    Diabetes Mother    Heart attack Mother    Stroke Father    Diabetes Father    Diabetes Maternal Grandmother    Hypertension Maternal Grandmother    Heart attack Sister     Social History   Socioeconomic History   Marital status: Single    Spouse name: Not on file   Number of children: 2   Years of education: 12   Highest education level: High school graduate  Occupational History   Not on file  Tobacco Use   Smoking status: Never   Smokeless tobacco: Never  Vaping Use   Vaping Use: Never used  Substance and Sexual Activity   Alcohol use: Not Currently   Drug use: Yes    Types: Marijuana    Comment: sometimes   Sexual activity: Yes    Birth control/protection: None    Comment: same partner x 10 years  Other Topics Concern   Not on file  Social History Narrative   She works as a Sports coach.   Right-handed.   One cup caffeine per day.   She lives at home with daughter.   Social Determinants of Health   Financial Resource Strain: Not on file  Food Insecurity: Not on file  Transportation Needs: Not on file  Physical Activity: Not on file  Stress: Not on file  Social Connections: Not on file  Intimate Partner Violence: Not on file    Review of Systems  All other systems reviewed and are negative.       Objective    BP 130/83   Pulse 70   Temp 98.1 F (36.7  C) (Oral)   Resp 16   Wt 165 lb (74.8 kg)   SpO2 96%   BMI 28.32 kg/m   Physical Exam Vitals and nursing note reviewed.  Constitutional:      General: She is not in acute distress. Cardiovascular:     Rate and Rhythm: Normal rate and regular rhythm.  Pulmonary:     Effort: Pulmonary effort is normal.     Breath sounds: Normal breath sounds.  Abdominal:     Palpations: Abdomen is soft.     Tenderness: There is no abdominal  tenderness.  Neurological:     General: No focal deficit present.     Mental Status: She is alert and oriented to person, place, and time.         Assessment & Plan:   1. Type 2 diabetes mellitus with other specified complication, with long-term current use of insulin ([place]) Continue present management  2. Essential hypertension Appears stable. Continue   3. Dyspepsia Referral to GI for further eval/mgt - Ambulatory referral to Gastroenterology  4. Screening for colon cancer Referral to GI for colonoscopy    Return in about 6 months (around 08/14/2023) for follow up.   Becky Sax, MD

## 2023-02-13 NOTE — Patient Instructions (Signed)
SURGICAL WAITING ROOM VISITATION Patients having surgery or a procedure may have no more than 2 support people in the waiting area - these visitors may rotate in the visitor waiting room.   Due to an increase in RSV and influenza rates and associated hospitalizations, children ages 28 and under may not visit patients in [place]. If the patient needs to stay at the hospital during part of their recovery, the visitor guidelines for inpatient rooms apply.  PRE-OP VISITATION  Pre-op nurse will coordinate an appropriate time for 1 support person to accompany the patient in pre-op.  This support person may not rotate.  This visitor will be contacted when the time is appropriate for the visitor to come back in the pre-op area.  Please refer to the [facility] website for the visitor guidelines for Inpatients (after your surgery is over and you are in a regular room).  You are not required to quarantine at this time prior to your surgery. However, you must do this: Hand Hygiene often Do NOT share personal items Notify your provider if you are in close contact with someone who has COVID or you develop fever 100.4 or greater, new onset of sneezing, cough, sore throat, shortness of breath or body aches.  If you test positive for Covid or have been in contact with anyone that has tested positive in the last 10 days please notify you surgeon.    Your procedure is scheduled on:  Friday  February 26, 2023  Report to [facility] Main Entrance: [place] entrance where the [employer] is available.   Report to admitting at: 06:15  AM  +++++Call this number if you have any questions or problems the morning of surgery [phone]  Do not eat food after Midnight the night prior to your surgery/procedure.  After Midnight you may have the following liquids until   05:45 AM DAY OF SURGERY  Clear Liquid Diet Water Black Coffee (sugar ok, NO MILK/CREAM OR CREAMERS)  Tea (sugar ok, NO  MILK/CREAM OR CREAMERS) regular and decaf                             Plain Jell-O  with no fruit (NO RED)                                           Fruit ices (not with fruit pulp, NO RED)                                     Popsicles (NO RED)                                                                  Juice: apple, WHITE grape, WHITE cranberry Sports drinks like Gatorade or Powerade (NO RED)                    The day of surgery:  Drink ONE (1) Pre-Surgery G2 at 05:45 AM the morning of surgery. Drink in one sitting. Do not sip.  This drink was given to you during your hospital pre-op appointment visit. Nothing else to drink after completing the Pre-Surgery  G2 : No candy, chewing gum or throat lozenges.    FOLLOW  ANY ADDITIONAL PRE OP INSTRUCTIONS YOU RECEIVED FROM YOUR SURGEON'S OFFICE!!!   Oral Hygiene is also important to reduce your risk of infection.        Remember - BRUSH YOUR TEETH THE MORNING OF SURGERY WITH YOUR REGULAR TOOTHPASTE  Do NOT smoke after Midnight the night before surgery.  Take ONLY these medicines the morning of surgery with A SIP OF WATER: pantoprazole (Protonix), Gabapentin   You may not have any metal on your body including hair pins, jewelry, and body piercing  Do not wear make-up, lotions, powders, perfumes or deodorant  Do not wear nail polish including gel and S&S, artificial / acrylic nails, or any other type of covering on natural nails including finger and toenails. If you have artificial nails, gel coating, etc., that needs to be removed by a nail salon, Please have this removed prior to surgery. Not doing so may mean that your surgery could be cancelled or delayed if the Surgeon or anesthesia staff feels like they are unable to monitor you safely.   Do not shave 48 hours prior to surgery to avoid nicks in your skin which may contribute to postoperative infections.   Contacts, Hearing Aids, dentures or bridgework may not be worn into  surgery. DENTURES WILL BE REMOVED PRIOR TO SURGERY PLEASE DO NOT APPLY "Poly grip" OR ADHESIVES!!!  You may bring a small overnight bag with you on the day of surgery, only pack items that are not valuable. [facility] IS NOT RESPONSIBLE   FOR VALUABLES THAT ARE LOST OR STOLEN.   Do not bring your home medications to the hospital. The Pharmacy will dispense medications listed on your medication list to you during your admission in the Hospital.  Special Instructions: Bring a copy of your healthcare power of attorney and living will documents the day of surgery, if you wish to have them scanned into your [facility] Records- EPIC  Please read over the following fact sheets you were given: IF YOU HAVE QUESTIONS ABOUT YOUR PRE-OP INSTRUCTIONS, PLEASE CALL [phone]   Zigmund Daniel)   [facility] - Preparing for Surgery Before surgery, you can play an important role.  Because skin is not sterile, your skin needs to be as free of germs as possible.  You can reduce the number of germs on your skin by washing with CHG (chlorahexidine gluconate) soap before surgery.  CHG is an antiseptic cleaner which kills germs and bonds with the skin to continue killing germs even after washing. Please DO NOT use if you have an allergy to CHG or antibacterial soaps.  If your skin becomes reddened/irritated stop using the CHG and inform your nurse when you arrive at Short Stay. Do not shave (including legs and underarms) for at least 48 hours prior to the first CHG shower.  You may shave your face/neck.  Please follow these instructions carefully:  1.  Shower with CHG Soap the night before surgery and the  morning of surgery.  2.  If you choose to wash your hair, wash your hair first as usual with your normal  shampoo.  3.  After you shampoo, rinse your hair and body thoroughly to remove the shampoo.  4.  Use CHG as you would any other liquid soap.  You can apply chg directly to the skin and  wash.  Gently with a scrungie or clean washcloth.  5.  Apply the CHG Soap to your body ONLY FROM THE NECK DOWN.   Do not use on face/ open                           Wound or open sores. Avoid contact with eyes, ears mouth and genitals (private parts).                       Wash face,  Genitals (private parts) with your normal soap.             6.  Wash thoroughly, paying special attention to the area where your  surgery  will be performed.  7.  Thoroughly rinse your body with warm water from the neck down.  8.  DO NOT shower/wash with your normal soap after using and rinsing off the CHG Soap.            9.  Pat yourself dry with a clean towel.            10.  Wear clean pajamas.            11.  Place clean sheets on your bed the night of your first shower and do not  sleep with pets.  ON THE DAY OF SURGERY : Do not apply any lotions/deodorants the morning of surgery.  Please wear clean clothes to the hospital/surgery center.    FAILURE TO FOLLOW THESE INSTRUCTIONS MAY RESULT IN THE CANCELLATION OF YOUR SURGERY  PATIENT SIGNATURE_________________________________  NURSE SIGNATURE__________________________________  ________________________________________________________________________       Adam Phenix    An incentive spirometer is a tool that can help keep your lungs clear and active. This tool measures how well you are filling your lungs with each breath. Taking Kleiner deep breaths may help reverse or decrease the chance of developing breathing (pulmonary) problems (especially infection) following: A Noon period of time when you are unable to move or be active. BEFORE THE PROCEDURE  If the spirometer includes an indicator to show your best effort, your nurse or respiratory therapist will set it to a desired goal. If possible, sit up straight or lean slightly forward. Try not to slouch. Hold the incentive spirometer in an upright position. INSTRUCTIONS FOR USE  Sit on  the edge of your bed if possible, or sit up as far as you can in bed or on a chair. Hold the incentive spirometer in an upright position. Breathe out normally. Place the mouthpiece in your mouth and seal your lips tightly around it. Breathe in slowly and as deeply as possible, raising the piston or the ball toward the top of the column. Hold your breath for 3-5 seconds or for as C[place]i as possible. Allow the piston or ball to fall to the bottom of the column. Remove the mouthpiece from your mouth and breathe out normally. Rest for a few seconds and repeat Steps 1 through 7 at least 10 times every 1-2 hours when you are awake. Take your time and take a few normal breaths between deep breaths. The spirometer may include an indicator to show your best effort. Use the indicator as a goal to work toward during each repetition. After each set of 10 deep breaths, practice coughing to be  sure your lungs are clear. If you have an incision (the cut made at the time of surgery), support your incision when coughing by placing a pillow or rolled up towels firmly against it. Once you are able to get out of bed, walk around indoors and cough well. You may stop using the incentive spirometer when instructed by your caregiver.  RISKS AND COMPLICATIONS Take your time so you do not get dizzy or light-headed. If you are in pain, you may need to take or ask for pain medication before doing incentive spirometry. It is harder to take a deep breath if you are having pain. AFTER USE Rest and breathe slowly and easily. It can be helpful to keep track of a log of your progress. Your caregiver can provide you with a simple table to help with this. If you are using the spirometer at home, follow these instructions: [place] IF:  You are having difficultly using the spirometer. You have trouble using the spirometer as often as instructed. Your pain medication is not giving enough relief while using the  spirometer. You develop fever of 100.5 F (38.1 C) or higher.                                                                                                    SEEK IMMEDIATE MEDICAL CARE IF:  You cough up bloody sputum that had not been present before. You develop fever of 102 F (38.9 C) or greater. You develop worsening pain at or near the incision site. MAKE SURE YOU:  Understand these instructions. Will watch your condition. Will get help right away if you are not doing well or get worse. Document Released: 03/29/2007 Document Revised: 02/08/2012 Document Reviewed: 05/30/2007 [facility] Patient Information 2014 [place], [place].      WHAT IS A BLOOD TRANSFUSION? Blood Transfusion Information  A transfusion is the replacement of blood or some of its parts. Blood is made up of multiple cells which provide different functions. Red blood cells carry oxygen and are used for blood loss replacement. White blood cells fight against infection. Platelets control bleeding. Plasma helps clot blood. Other blood products are available for specialized needs, such as hemophilia or other clotting disorders. BEFORE THE TRANSFUSION  Who gives blood for transfusions?  Healthy volunteers who are fully evaluated to make sure their blood is safe. This is blood bank blood. Transfusion therapy is the safest it has ever been in the practice of medicine. Before blood is taken from a donor, a complete history is taken to make sure that person has no history of diseases nor engages in risky social behavior (examples are intravenous drug use or sexual activity with multiple partners). The donor's travel history is screened to minimize risk of transmitting infections, such as malaria. The donated blood is tested for signs of infectious diseases, such as HIV and hepatitis. The blood is then tested to be sure it is compatible with you in order to minimize the chance of a transfusion reaction. If you or a relative  donates blood, this is often done in anticipation of  surgery and is not appropriate for emergency situations. It takes many days to process the donated blood. RISKS AND COMPLICATIONS Although transfusion therapy is very safe and saves many lives, the main dangers of transfusion include:  Getting an infectious disease. Developing a transfusion reaction. This is an allergic reaction to something in the blood you were given. Every precaution is taken to prevent this. The decision to have a blood transfusion has been considered carefully by your caregiver before blood is given. Blood is not given unless the benefits outweigh the risks. AFTER THE TRANSFUSION Right after receiving a blood transfusion, you will usually feel much better and more energetic. This is especially true if your red blood cells have gotten low (anemic). The transfusion raises the level of the red blood cells which carry oxygen, and this usually causes an energy increase. The nurse administering the transfusion will monitor you carefully for complications. HOME CARE INSTRUCTIONS  No special instructions are needed after a transfusion. You may find your energy is better. Speak with your caregiver about any limitations on activity for underlying diseases you may have. SEEK MEDICAL CARE IF:  Your condition is not improving after your transfusion. You develop redness or irritation at the intravenous (IV) site. SEEK IMMEDIATE MEDICAL CARE IF:  Any of the following symptoms occur over the next 12 hours: Shaking chills. You have a temperature by mouth above 102 F (38.9 C), not controlled by medicine. Chest, back, or muscle pain. People around you feel you are not acting correctly or are confused. Shortness of breath or difficulty breathing. Dizziness and fainting. You get a rash or develop hives. You have a decrease in urine output. Your urine turns a dark color or changes to pink, red, or brown. Any of the following symptoms  occur over the next 10 days: You have a temperature by mouth above 102 F (38.9 C), not controlled by medicine. Shortness of breath. Weakness after normal activity. The white part of the eye turns yellow (jaundice). You have a decrease in the amount of urine or are urinating less often. Your urine turns a dark color or changes to pink, red, or brown. Document Released: 11/13/2000 Document Revised: 02/08/2012 Document Reviewed: 07/02/2008 [facility] Patient Information 2014 [place], [place].  _______________________________________________________________________

## 2023-02-13 NOTE — Progress Notes (Signed)
COVID Vaccine received:  []  No [x]  Yes Date of any COVID positive Test in last 90 days:   None  PCP - Dorna Mai, MD Cardiologist - Adrian Prows, MD  Chest x-ray - 07-04-2022  1v   Epic EKG - 09-24-2022  Stress Test -  ECHO - 10-09-2019  epic Cardiac Cath - LHC - DES x3  10-09-2019 by Adrian Prows, MD  PCR screen: [x]  Ordered & Completed                      []   No Order but Needs PROFEND                      []   N/A for this surgery  Surgery Plan:  []  Ambulatory                            [x]  Outpatient in bed                            []  Admit  Anesthesia:    []  General  [x]  Spinal                           []   Choice []   MAC  Pacemaker / ICD device [x]  No []  Yes        Device order form faxed [x]  No    []   Yes      Faxed to:  Spinal Cord Stimulator:[x]  No []  Yes      (Remind patient to bring remote DOS) Other Implants:   History of Sleep Apnea? [x]  No []  Yes   CPAP used?- [x]  No []  Yes    Does the patient monitor blood sugar? []  No [x]  Yes  []  N/A        Freestyle Libre 3 reading at PST  117  Patient has: []  Pre-DM   []  DM1  [x]   DM2 Does patient have a [employer] or Dexacom? []  No [x]  Yes   Fasting Blood Sugar Ranges- 100-120 Checks Blood Sugar continuous times a day  GLP1 agonist- Ozempic on Saturdays GLP1 instructions: Last dose; Saturday 02-13-23 SGLT-2 inhibitors- Wilder Glade SGLT-2 instructions: last dose: 02-22-23  Other Diabetic medications/ instructions:  Tresiba  8 units q am, 16 units q pm  Blood Thinner / Instructions:  none Aspirin Instructions: ASA 81 mg  Stop 7 days  ERAS Protocol Ordered: []  No  [x]  Yes PRE-SURGERY []  ENSURE  [x]  G2  Patient is to be NPO after: 05:45 am  Activity level: Patient is able to climb a flight of stairs without difficulty; [x]  No CP  [x]  No SOB, but would have leg and back pain.___   Patient can perform ADLs without assistance.   Anesthesia review: DM2, HTN, CAD- NSTEMI, [place]  (Cath- DES x 3)  Patient denies  shortness of breath, fever, cough and chest pain at PAT appointment.  Patient verbalized understanding and agreement to the Pre-Surgical Instructions that were given to them at this PAT appointment. Patient was also educated of the need to review these PAT instructions again prior to her surgery.I reviewed the appropriate phone numbers to call if they have any and questions or concerns.

## 2023-02-15 ENCOUNTER — Encounter (HOSPITAL_COMMUNITY)
Admission: RE | Admit: 2023-02-15 | Discharge: 2023-02-15 | Disposition: A | Discharge status: Home / Self Care | Payer: BC Managed Care – PPO | Source: Ambulatory Visit | Attending: Orthopaedic Surgery | Admitting: Orthopaedic Surgery

## 2023-02-15 ENCOUNTER — Ambulatory Visit: Payer: BC Managed Care – PPO | Admitting: Podiatry

## 2023-02-15 ENCOUNTER — Encounter (HOSPITAL_COMMUNITY): Payer: Self-pay

## 2023-02-15 ENCOUNTER — Other Ambulatory Visit: Payer: Self-pay

## 2023-02-15 VITALS — BP 160/85 | HR 80 | Temp 98.5°F | Resp 16 | Ht 64.0 in | Wt 164.0 lb

## 2023-02-15 DIAGNOSIS — Z01818 Encounter for other preprocedural examination: Secondary | ICD-10-CM

## 2023-02-15 DIAGNOSIS — E119 Type 2 diabetes mellitus without complications: Secondary | ICD-10-CM | POA: Diagnosis not present

## 2023-02-15 DIAGNOSIS — Z01812 Encounter for preprocedural laboratory examination: Secondary | ICD-10-CM | POA: Diagnosis present

## 2023-02-15 DIAGNOSIS — Z794 Long term (current) use of insulin: Secondary | ICD-10-CM | POA: Diagnosis not present

## 2023-02-15 LAB — SURGICAL PCR SCREEN
MRSA, PCR: NEGATIVE
Staphylococcus aureus: NEGATIVE

## 2023-02-15 LAB — COMPREHENSIVE METABOLIC PANEL
ALT: 17 U/L (ref 0–44)
AST: 18 U/L (ref 15–41)
Albumin: 4.3 g/dL (ref 3.5–5.0)
Alkaline Phosphatase: 72 U/L (ref 38–126)
Anion gap: 8 (ref 5–15)
BUN: 18 mg/dL (ref 8–23)
CO2: 24 mmol/L (ref 22–32)
Calcium: 9 mg/dL (ref 8.9–10.3)
Chloride: 108 mmol/L (ref 98–111)
Creatinine, Ser: 0.61 mg/dL (ref 0.44–1.00)
GFR, Estimated: >60 mL/min (ref >60)
Glucose, Bld: 80 mg/dL (ref 70–99)
Potassium: 3.8 mmol/L (ref 3.5–5.1)
Sodium: 140 mmol/L (ref 135–145)
Total Bilirubin: 0.5 mg/dL (ref 0.3–1.2)
Total Protein: 7.3 g/dL (ref 6.5–8.1)

## 2023-02-15 LAB — TYPE AND SCREEN
ABO/RH(D): O POS
Antibody Screen: NEGATIVE

## 2023-02-15 LAB — CBC
HCT: 43.7 % (ref 36.0–46.0)
Hemoglobin: 13.8 g/dL (ref 12.0–15.0)
MCH: 29.3 pg (ref 26.0–34.0)
MCHC: 31.6 g/dL (ref 30.0–36.0)
MCV: 92.8 fL (ref 80.0–100.0)
Platelets: 315 10*3/uL (ref 150–400)
RBC: 4.71 MIL/uL (ref 3.87–5.11)
RDW: 12.2 % (ref 11.5–15.5)
WBC: 9.9 10*3/uL (ref 4.0–10.5)
nRBC: 0 % (ref 0.0–0.2)

## 2023-02-16 ENCOUNTER — Other Ambulatory Visit: Payer: Self-pay | Admitting: Cardiology

## 2023-02-16 LAB — HEMOGLOBIN A1C
Hgb A1c MFr Bld: 6.2 % — ABNORMAL HIGH (ref 4.8–5.6)
Mean Plasma Glucose: 131 mg/dL

## 2023-02-18 NOTE — Progress Notes (Signed)
Anesthesia Chart Review   Case: [id number] Date/Time: 02/26/23 0830   Procedure: LEFT TOTAL KNEE ARTHROPLASTY (Left: Knee) - Needs RNFA   Anesthesia type: Spinal   Pre-op diagnosis: osteoarthritis left knee   Location: [facility] ROOM 10 / [facility]   Surgeons: Mcarthur Rossetti, MD       DISCUSSION:62 y.o. never smoker with h/o HTN, DM, CAD (S/P stenting to mid right and also to the distal LAD by implantation of DES on 10/09/2019), left knee OA scheduled for above procedure 02/26/2023 with Dr. Jean Rosenthal.   Pt last seen by cardiology 09/24/2022. Stable at this visit with one year follow up recommended.   VS: BP (!) 160/85 Comment: left arm sitting  Pulse 80   Temp 36.9 C (Oral)   Resp 16   Ht 5\' 4"  (1.626 m)   Wt 74.4 kg   SpO2 100%   BMI 28.15 kg/m   PROVIDERS: Dorna Mai, MD is PCP  Cardiologist - Adrian Prows, MD  LABS: Labs reviewed: Acceptable for surgery. (all labs ordered are listed, but only abnormal results are displayed)  Labs Reviewed  HEMOGLOBIN A1C - Abnormal; Notable for the following components:      Result Value   Hgb A1c MFr Bld 6.2 (*)    All other components within normal limits  SURGICAL PCR SCREEN  CBC  COMPREHENSIVE METABOLIC PANEL  TYPE AND SCREEN     IMAGES: Carotid artery duplex  08/13/2020: Minimal stenosis in the left internal carotid artery (1-15%). Stenosis in the left common carotid artery (<50%). Antegrade right vertebral artery flow. Antegrade left vertebral artery flow. Follow up studies if clinically indicated.    EKG:   CV: Left heart catheterization 10/09/2019: Hyperdynamic LV, EF 65 to 70%, EDP normal. RCA is a dominant vessel.  Ulcerated tandem 80% followed by a 99% stenosis.  SP 2.5 x 38 and a 2.5 x 8 mm resolute Onyx DES stents implanted, second stent used for distal edge dissection.  Stenosis reduced to 0% with TIMI-3 to TIMI-3 flow. Circumflex coronary artery is smooth with minimal disease.  Gives origin to  large OM1 and OM 2.  Tortuous. LAD is diffusely diseased from the mid to distal segment.  There is a hazy 70 to 80% stenosis in the mid LAD.  Following which distal LAD had a 40% diffuse disease.  Mild calcification is evident.  SP 2.24 x 15 mm resolute Onyx DES postdilated with 2.25 x 8 mm Sapphire Sullivan at 22 atmospheric pressure with 0% residual stenosis.  TIMI-3 to TIMI-3 flow.   Recommendation: Patient can be discharged home tomorrow if she remains stable, aggressive risk factor modification is indicated with regard to CAD and also diabetes mellitus.  230 mL contrast utilized.  Echo 10/09/2019 1. Left ventricular ejection fraction, by visual estimation, is 50 to  55%. The left ventricle has low normal function. There is mildly increased  left ventricular hypertrophy. Basal to mid inferolateral hypokinesis   2. The average left ventricular global longitudinal strain is -14.9 %.   3. Global right ventricle has normal systolic function.The right  ventricular size is normal. No increase in right ventricular wall  thickness.   4. Left atrial size was normal.   5. Right atrial size was normal.   6. The mitral valve is normal in structure. No evidence of mitral valve  regurgitation.   7. The tricuspid valve is normal in structure. Tricuspid valve  regurgitation is not demonstrated.   8. The aortic valve is tricuspid. Aortic  valve regurgitation is not  visualized. No evidence of aortic valve sclerosis or stenosis.   9. The pulmonic valve was not well visualized. Pulmonic valve  regurgitation is not visualized.  10. The inferior vena cava is normal in size with greater than 50%  respiratory variability, suggesting right atrial pressure of 3 mmHg.  11. The interatrial septum was not well visualized.  Past Medical History:  Diagnosis Date   Arthritis    Campylobacter enteritis May 2012   [facility] admission   Carpal tunnel syndrome, bilateral    Diabetes mellitus    Gastritis with bleeding due to  alcohol 2010   [facility] admission   GERD (gastroesophageal reflux disease)    Hyperlipidemia    Hypertension    no meds   Menopause    Myocardial infarction [facility])    Neck pain    Wears glasses     Past Surgical History:  Procedure Laterality Date   CARPAL TUNNEL RELEASE Left 05/14/2014   Procedure: LEFT ULNAR NEUROPLASTY AT ELBOW AND ENDOSCOPIC CARPAL TUNNEL RELEASE;  Surgeon: Jolyn Nap, MD;  Location: [place];  Service: Orthopedics;  Laterality: Left;   CORONARY STENT INTERVENTION N/A 10/09/2019   Procedure: CORONARY STENT INTERVENTION;  Surgeon: Adrian Prows, MD;  Location: [place] CV LAB;  Service: Cardiovascular;  Laterality: N/A;   CYST EXCISION  1995   under arms   endometrial biopsy  2004   benign   FOOT OSTEOTOMY  2010   right-spurs   LEFT HEART CATH AND CORONARY ANGIOGRAPHY N/A 10/09/2019   Procedure: LEFT HEART CATH AND CORONARY ANGIOGRAPHY;  Surgeon: Adrian Prows, MD;  Location: [place] CV LAB;  Service: Cardiovascular;  Laterality: N/A;   TONSILLECTOMY     ULNAR NERVE TRANSPOSITION Left 05/14/2014   Procedure: ULNAR NERVE DECOMPRESSION/TRANSPOSITION;  Surgeon: Jolyn Nap, MD;  Location: [place];  Service: Orthopedics;  Laterality: Left;    MEDICATIONS:  Ascorbic Acid (VITAMIN C PO)   aspirin 81 MG chewable tablet   atorvastatin (LIPITOR) 40 MG tablet   Continuous Blood Gluc Sensor (FREESTYLE LIBRE 3 SENSOR) MISC   FARXIGA 10 MG TABS tablet   gabapentin (NEURONTIN) 600 MG tablet   glucose blood (BAYER CONTOUR TEST) test strip   hydrOXYzine (ATARAX) 25 MG tablet   insulin aspart (FIASP FLEXTOUCH) 100 UNIT/ML FlexTouch Pen   Insulin Pen Needle 32G X 4 MM MISC   Lancet Devices (EASY TOUCH LANCING DEVICE) MISC   latanoprost (XALATAN) 0.005 % ophthalmic solution   lidocaine (XYLOCAINE) 5 % ointment   methocarbamol (ROBAXIN) 500 MG tablet   metoprolol succinate (TOPROL-XL) 50 MG 24 hr tablet   naloxone (NARCAN) nasal  spray 4 mg/0.1 mL   oxyCODONE-acetaminophen (PERCOCET) 7.5-325 MG tablet   OZEMPIC, 2 MG/DOSE, 8 MG/3ML SOPN   pantoprazole (PROTONIX) 40 MG tablet   solifenacin (VESICARE) 5 MG tablet   TRESIBA FLEXTOUCH 200 UNIT/ML FlexTouch Pen   triamcinolone cream (KENALOG) 0.1 %   vitamin B-12 (CYANOCOBALAMIN) 1000 MCG tablet   No current facility-administered medications for this encounter.   Konrad Felix Ward, PA-C [facility] [phone]

## 2023-02-25 ENCOUNTER — Ambulatory Visit: Payer: BC Managed Care – PPO | Admitting: Internal Medicine

## 2023-02-25 ENCOUNTER — Encounter: Payer: Self-pay | Admitting: Internal Medicine

## 2023-02-25 VITALS — BP 138/84 | HR 74 | Ht 63.0 in | Wt 166.0 lb

## 2023-02-25 DIAGNOSIS — Z794 Long term (current) use of insulin: Secondary | ICD-10-CM | POA: Diagnosis not present

## 2023-02-25 DIAGNOSIS — E1169 Type 2 diabetes mellitus with other specified complication: Secondary | ICD-10-CM

## 2023-02-25 DIAGNOSIS — E11319 Type 2 diabetes mellitus with unspecified diabetic retinopathy without macular edema: Secondary | ICD-10-CM | POA: Diagnosis not present

## 2023-02-25 DIAGNOSIS — E663 Overweight: Secondary | ICD-10-CM

## 2023-02-25 DIAGNOSIS — E785 Hyperlipidemia, unspecified: Secondary | ICD-10-CM

## 2023-02-25 MED ORDER — METFORMIN HCL 1000 MG PO TABS: 1000.0000 mg | ORAL_TABLET | Freq: Every day | ORAL | 3 refills | Status: DC

## 2023-02-25 NOTE — Patient Instructions (Addendum)
Please continue: - Farxiga 10 mg before b'fast - Ozempic 1 mg weekly - Tresiba U200 32 units daily - FiAsp: 8-11 units before b'fast 8-11 units before lunch 13-16 units before dinner  Restart: - Metformin 2000 mg with dinner - 1 week after surgery  Please stop at the lab.  Please return in 4-6 months.

## 2023-02-25 NOTE — Progress Notes (Signed)
Patient ID: [name], female   DOB: 07-25-1961, 62 y.o.   MRN: [id number]  HPI: [name] is a 62 y.o.-year-old female, returning for f/u for DM2, dx ~2000, insulin-dependent since 2010, uncontrolled, with complications (PN, + DR). Last visit 4 months ago. She is not seeing Dr. Derrel Nip anymore as she does not want to drive to [facility].  Interim history: No increased urination, + blurry vision. She also has joint pains in the right side of her body and left knee. She has surgery coming up tomorrow - TKR (L).  Reviewed HbA1c levels: Lab Results  Component Value Date   HGBA1C 6.2 (H) 02/15/2023   HGBA1C 6.2 (A) 10/26/2022   HGBA1C 7.8 (H) 07/04/2022   HGBA1C 6.5 (A) 04/10/2022   HGBA1C 7.7 (A) 11/10/2021   HGBA1C 7.3 (A) 01/24/2021   HGBA1C 7.1 (A) 09/13/2020   HGBA1C 6.4 (A) 05/13/2020   HGBA1C 7.6 (A) 08/31/2019   HGBA1C 7.8 (A) 11/25/2018   HGBA1C 6.8 (A) 07/25/2018   HGBA1C 6.4% 03/03/2018   HGBA1C 6.5 11/01/2017   HGBA1C 6.2 07/23/2017   HGBA1C 6.8 04/09/2017   HGBA1C 6.4 01/08/2017   HGBA1C 6.4 10/08/2016   HGBA1C 10.4 07/07/2016   HGBA1C 10.4 04/07/2016   HGBA1C 9.3 01/14/2016   HGBA1C 10.9 (H) 09/25/2015   HGBA1C 11.2 09/19/2015   HGBA1C 7.7 06/21/2015   HGBA1C 7.9 (H) 03/22/2015   HGBA1C 9.4 (H) 01/04/2015   HGBA1C 8.8 (H) 10/04/2014   HGBA1C 10.3 (H) 07/06/2014   HGBA1C 7.0 (H) 03/26/2014   HGBA1C 9.5 (H) 12/26/2013   HGBA1C 7.5 (H) 09/20/2013   Pt was on:  Insulin Before breakfast Before lunch Before dinner  R 10 10 (6 units if active after the meal) 20-25  NPH 15 - 20  Also: - Metformin 1000 mg twice a day  Now on: - Metformin 2000 mg with dinner >> off now (?) - Farxiga 10 mg before b'fast - Ozempic 1 mg weekly - Tresiba U200 32 units daily - FiAsp: 8-11 units before b'fast 8-11 units before lunch 13-16 units before dinner  Pt is checking her sugars >4x a day with her CGM:  Previously:  Prev.   Lowest sugar was 29 (?) in 11/2020  >> 40s x1 >> 35 (skipped the meal) >> 63 >> 53 in am >> 50s; she has hypoglycemia awareness in the 80s. Highest sugar was  497 ...>> 300s >> 400 >> 200s >> 280s.  -No CKD, last BUN/creatinine:  Lab Results  Component Value Date   BUN 18 02/15/2023   CREATININE 0.61 02/15/2023   ACR was normal: Lab Results  Component Value Date   MICRALBCREAT <4 11/12/2022   MICRALBCREAT 3 01/24/2021   MICRALBCREAT 1.8 06/27/2018   MICRALBCREAT 1.6 06/23/2017   MICRALBCREAT 0.7 06/19/2016   MICRALBCREAT 0.4 09/25/2015   MICRALBCREAT 0.3 07/30/2014   MICRALBCREAT 0.5 06/19/2013   MICRALBCREAT 1.5 01/31/2013   MICRALBCREAT 2.6 11/20/2011  Not on ACE inhibitor/ARB  -+ HL; last set of lipids: Lab Results  Component Value Date   CHOL 124 01/24/2021   HDL 51 01/24/2021   LDLCALC 55 01/24/2021   LDLDIRECT 94.0 09/25/2015   TRIG 99 01/24/2021   CHOLHDL 2.4 01/24/2021  Prev. on Lipitor 40, Zetia 10. Not now.  - last eye exam was in 11/2021: + DR OU, reportedly glaucoma. She sees Dr. Arnoldo Morale.    - + numbness and tingling in her feet.  She is on Neurontin.  She sees podiatry.  Last foot  exam was checked by Dr. Prudence Davidson 09/02/2022.  She also has a history of HTN, episodic alcohol abuse, GERD, low back pain, OA.  She went to the emergency room 06/28/2021 for nausea, vomiting, and abdominal pain.  Her lipase was high.  Ozempic was stopped. She was also in the ED post a fall down the stairs 08/2021. L forearm was in a splint >> saw ortho She had severe sciatic back pain recently along with nausea/vomiting/abdominal pain (07/04/2022) - was admitted.  Lipase level was normal.  It was considered that the symptoms were related to smoking marijuana and also possibly her diabetes.   ROS: + see HPI  I reviewed pt's medications, allergies, PMH, social hx, family hx, and changes were documented in the history of present illness. Otherwise, unchanged from my initial visit note.  Past Medical History:  Diagnosis  Date   Arthritis    Campylobacter enteritis May 2012   [facility] admission   Carpal tunnel syndrome, bilateral    Diabetes mellitus    Gastritis with bleeding due to alcohol 2010   [facility] admission   GERD (gastroesophageal reflux disease)    Hyperlipidemia    Hypertension    no meds   Menopause    Myocardial infarction [facility])    Neck pain    Wears glasses    Past Surgical History:  Procedure Laterality Date   CARPAL TUNNEL RELEASE Left 05/14/2014   Procedure: LEFT ULNAR NEUROPLASTY AT ELBOW AND ENDOSCOPIC CARPAL TUNNEL RELEASE;  Surgeon: Jolyn Nap, MD;  Location: [place];  Service: Orthopedics;  Laterality: Left;   CORONARY STENT INTERVENTION N/A 10/09/2019   Procedure: CORONARY STENT INTERVENTION;  Surgeon: Adrian Prows, MD;  Location: [place] [facility] LAB;  Service: Cardiovascular;  Laterality: N/A;   CYST EXCISION  1995   under arms   endometrial biopsy  2004   benign   FOOT OSTEOTOMY  2010   right-spurs   LEFT HEART CATH AND CORONARY ANGIOGRAPHY N/A 10/09/2019   Procedure: LEFT HEART CATH AND CORONARY ANGIOGRAPHY;  Surgeon: Adrian Prows, MD;  Location: [place] CV LAB;  Service: Cardiovascular;  Laterality: N/A;   TONSILLECTOMY     ULNAR NERVE TRANSPOSITION Left 05/14/2014   Procedure: ULNAR NERVE DECOMPRESSION/TRANSPOSITION;  Surgeon: Jolyn Nap, MD;  Location: [place];  Service: Orthopedics;  Laterality: Left;   Social History   Socioeconomic History   Marital status: Single    Spouse name: Not on file   Number of children: 2   Years of education: 12   Highest education level: High school graduate  Occupational History   Not on file  Tobacco Use   Smoking status: Never   Smokeless tobacco: Never  Vaping Use   Vaping Use: Never used  Substance and Sexual Activity   Alcohol use: Not Currently   Drug use: Yes    Types: Marijuana    Comment: sometimes   Sexual activity: Yes    Birth control/protection: None    Comment: same  partner x 10 years  Other Topics Concern   Not on file  Social History Narrative   She works as a Sports coach.   Right-handed.   One cup caffeine per day.   She lives at home with daughter.   Social Determinants of Health   Financial Resource Strain: Not on file  Food Insecurity: Not on file  Transportation Needs: Not on file  Physical Activity: Not on file  Stress: Not on file  Social Connections: Not on  file  Intimate Partner Violence: Not on file   Current Outpatient Medications on File Prior to Visit  Medication Sig Dispense Refill   Ascorbic Acid (VITAMIN C PO) Take 1 tablet by mouth daily.     aspirin 81 MG chewable tablet Chew 1 tablet (81 mg total) by mouth daily.     atorvastatin (LIPITOR) 40 MG tablet Take 1 tablet (40 mg total) by mouth daily. (Patient not taking: Reported on 02/11/2023) 90 tablet 3   Continuous Blood Gluc Sensor (FREESTYLE LIBRE 3 SENSOR) MISC 1 each by Does not apply route every 14 (fourteen) days. 6 each 3   FARXIGA 10 MG TABS tablet Take 10 mg by mouth daily.     gabapentin (NEURONTIN) 600 MG tablet Take 600 mg by mouth 3 (three) times daily.     glucose blood (BAYER CONTOUR TEST) test strip TEST BLOOD SUGARS 3 TIMES DAILY 200 each 11   hydrOXYzine (ATARAX) 25 MG tablet Take 25 mg by mouth 3 (three) times daily as needed. itching (Patient not taking: Reported on 02/11/2023)     insulin aspart (FIASP FLEXTOUCH) 100 UNIT/ML FlexTouch Pen Inject 6-20 Units into the skin 3 (three) times daily before meals. 30 mL 3   Insulin Pen Needle 32G X 4 MM MISC Use 4x a day 300 each 3   Lancet Devices (EASY TOUCH LANCING DEVICE) MISC Use to check diabetes 4 times daily  E 11.65 1 each 11   latanoprost (XALATAN) 0.005 % ophthalmic solution Place 1 drop into both eyes at bedtime.     lidocaine (XYLOCAINE) 5 % ointment Apply 1 application. topically as needed. (Patient not taking: Reported on 02/11/2023) 35.44 g 0   methocarbamol (ROBAXIN) 500 MG tablet Take 1 tablet (500  mg total) by mouth every 8 (eight) hours as needed for muscle spasms. (Patient not taking: Reported on 02/11/2023) 30 tablet 0   metoprolol succinate (TOPROL-XL) 50 MG 24 hr tablet TAKE 1 TABLET BY MOUTH ONCE DAILY. TAKE  WITH  OR  IMMEDIATELY  FOLLOWING  A  MEAL 30 tablet 0   naloxone (NARCAN) nasal spray 4 mg/0.1 mL Place 1 spray into the nose as needed (or).     oxyCODONE-acetaminophen (PERCOCET) 7.5-325 MG tablet Take 1 tablet by mouth every 8 (eight) hours as needed for moderate pain.     OZEMPIC, 2 MG/DOSE, 8 MG/3ML SOPN Inject 2 mg into the skin once a week. Saturday     pantoprazole (PROTONIX) 40 MG tablet Take 1 tablet (40 mg total) by mouth 2 (two) times daily. 60 tablet 0   solifenacin (VESICARE) 5 MG tablet Take 5 mg by mouth daily. (Patient not taking: Reported on 02/11/2023)     TRESIBA FLEXTOUCH 200 UNIT/ML FlexTouch Pen INJECT 30 TO 34 UNITS SUBCUTANEOUSLY ONCE DAILY (Patient taking differently: Inject 8-16 Units into the skin See admin instructions. Take 8 units in the morning and at lunch and 16 units at bedtime) 9 mL 0   triamcinolone cream (KENALOG) 0.1 % Apply 1 Application topically 3 (three) times daily as needed (itching).     vitamin B-12 (CYANOCOBALAMIN) 1000 MCG tablet Take 1,000 mcg by mouth daily.     No current facility-administered medications on file prior to visit.   Allergies  Allergen Reactions   Oxycodone-Acetaminophen Other (See Comments)    Causes her to feel "hot".   Penicillins Rash    Did it involve swelling of the face/tongue/throat, SOB, or low BP? No Did it involve sudden or severe rash/hives, skin  peeling, or any reaction on the inside of your mouth or nose? No Did you need to seek medical attention at a hospital or doctor's office? No When did it last happen?       If all above answers are "NO", may proceed with cephalosporin use.   Family History  Problem Relation Age of Onset   Hypertension Mother    Diabetes Mother    Heart attack Mother     Stroke Father    Diabetes Father    Diabetes Maternal Grandmother    Hypertension Maternal Grandmother    Heart attack Sister    PE: BP 138/84   Pulse 74   Ht 5\' 3"  (1.6 m)   Wt 166 lb (75.3 kg)   SpO2 99%   BMI 29.41 kg/m    Wt Readings from Last 3 Encounters:  02/25/23 166 lb (75.3 kg)  02/15/23 164 lb (74.4 kg)  02/11/23 165 lb (74.8 kg)   Constitutional: overweight Eyes: no exophthalmos ENT: no masses palpated in neck, no cervical lymphadenopathy Cardiovascular: RRR, No MRG Respiratory: CTA B Musculoskeletal: no deformities Skin: moist, warm, no rashes Neurological: no tremor with outstretched hands  ASSESSMENT: 1. DM2, insulin-dependent, uncontrolled, with complications - peripheral neuropathy - DR   2. Overweight  3. HL  PLAN:  1. Patient with longstanding, uncontrolled, type 2 diabetes, with improved control on a complex medication regimen with metformin, SGLT2 inhibitor, GLP-1 receptor agonist and basal/bolus insulin regimen along with the CGM.  At last visit, HbA1c was 6.2%, improved, and she had an identical HbA1c several days ago.  At last visit, sugars were fluctuating within the target range but occasionally dropping to the 50s and 60s.  We decreased her Fiasp doses and continue the rest of the regimen. CGM interpretation: -At today's visit, we reviewed her CGM downloads: It appears that 89% of values are in target range (goal >70%), while 11% are higher than 180 (goal <25%), and 0% are lower than 70 (goal <4%).  The calculated average blood sugar is 141.  The projected HbA1c for the next 3 months (GMI) is 6.7%. -Reviewing the CGM trends, sugars are fluctuating mostly within the target range, with occasional higher blood sugars after dinner.  They are mostly in the upper normal range at bedtime and overnight.  Upon questioning, this may be related to the fact that she is not on metformin anymore.  She is not sure what happened but she is not taking it.  For now,  I advised her to hold off starting it - I advised her to: Patient Instructions  Please continue: - Farxiga 10 mg before b'fast - Ozempic 1 mg weekly - Tresiba U200 32 units daily - FiAsp: 8-11 units before b'fast 8-11 units before lunch 13-16 units before dinner  Restart: - Metformin 1000 mg with dinner - 1 week after surgery  Please stop at the lab.  Please return in 4-6 months.  - advised to check sugars at different times of the day - 4x a day, rotating check times - advised for yearly eye exams >> she is UTD - return to clinic in 4-6 months  2. Overweight -We tried Ozempic in the past but she had to come off due to nausea/vomiting/abdominal pain and a high lipase in 05/2021.  However, she really wanted to try this again.  After a discussion about benefits and possible side effects and possible alarm symptoms, we restarted Ozempic.  She continues on this, tolerated well. -She lost 17 pounds before  last visit and lost 7 more pounds since then!  3. HL -Reviewed latest lipid panel from 12/2020: Fractions at goal: Lab Results  Component Value Date   CHOL 124 01/24/2021   HDL 51 01/24/2021   LDLCALC 55 01/24/2021   LDLDIRECT 94.0 09/25/2015   TRIG 99 01/24/2021   CHOLHDL 2.4 01/24/2021  -She was on Lipitor 40 mg daily and Zetia 10 mg daily without side effects - now off... -We will recheck her lipid panel now we will probably need to restart at least the  statin.  Component     Latest Ref Rng 02/25/2023  HDL Cholesterol     >39 mg/dL 51   Triglycerides     0 - 149 mg/dL 134   Total CHOL/HDL Ratio     0.0 - 4.4 ratio 4.2   Cholesterol, Total     100 - 199 mg/dL 216 (H)   VLDL Cholesterol Cal     5 - 40 mg/dL 24   LDL Chol Calc (NIH)     0 - 99 mg/dL 141 (H)   Much higher LDL off the statin.  I will advise her to restart with her statin (Lipitor 40 mg daily), without Zetia for now.  I will send a prescription to her pharmacy.  Philemon Kingdom, MD PhD [facility]  Endocrinology

## 2023-02-25 NOTE — H&P (Signed)
TOTAL KNEE ADMISSION H&P  Patient is being admitted for left total knee arthroplasty.  Subjective:  Chief Complaint:left knee pain.  HPI: [name], 62 y.o. female, has a history of pain and functional disability in the left knee due to arthritis and has failed non-surgical conservative treatments for greater than 12 weeks to includeNSAID's and/or analgesics, corticosteriod injections, viscosupplementation injections, flexibility and strengthening excercises, and activity modification.  Onset of symptoms was gradual, starting 3 years ago with gradually worsening course since that time. The patient noted no past surgery on the left knee(s).  Patient currently rates pain in the left knee(s) at 10 out of 10 with activity. Patient has night pain, worsening of pain with activity and weight bearing, pain that interferes with activities of daily living, pain with passive range of motion, crepitus, and joint swelling.  Patient has evidence of subchondral sclerosis, periarticular osteophytes, and joint space narrowing by imaging studies. There is no active infection.  Patient Active Problem List   Diagnosis Date Noted   Unilateral primary osteoarthritis, left knee 09/09/2022   Intractable nausea and vomiting 07/04/2022   Hematemesis 07/04/2022   Hypokalemia 07/04/2022   Lactic acidosis 07/04/2022   Pain due to onychomycosis of toenails of both feet 04/28/2022   Diabetic neuropathy, type II diabetes mellitus ([place]) 04/28/2022   De Quervain's tenosynovitis, left 11/13/2021   Bilateral injections given May 21, 2020 degenerative arthritis of knee, bilateral 05/21/2020   Traumatic hematoma of female breast 11/29/2019   Dizziness 11/29/2019   Hospital discharge follow-up 10/23/2019   NSTEMI (non-ST elevated myocardial infarction) ([place]) 10/10/2019   CAD (coronary artery disease) 10/10/2019   Chest pain 10/09/2019   Elevated troponin 10/09/2019   Uncontrolled type 2 diabetes mellitus with  hyperglycemia ([place]) 10/09/2019   Obesity (BMI 30.0-34.9) 10/09/2019   Cervical spondylitis with radiculitis ([place]) 08/29/2019   Acute bursitis of left shoulder 01/30/2019   Chronic midline posterior neck pain 06/28/2018   Urgency incontinence 06/28/2018   Degenerative joint disease of knee, left 04/26/2018   Carpal tunnel syndrome on both sides 03/29/2018   Anemia 01/02/2018   Class 1 obesity with serious comorbidity and body mass index (BMI) of 32.0 to 32.9 in adult 07/23/2017   Left hip pain 05/04/2017   Carpal tunnel syndrome on right 12/27/2016   Polyarthralgia 11/09/2016   Uncontrolled type 2 diabetes mellitus with retinopathy, with long-term current use of insulin 07/07/2016   Visit for preventive health examination 06/21/2016   Increased urinary frequency 06/21/2016   Chronic meniscal tear of knee 08/19/2015   Left knee pain 06/19/2015   Candidiasis of skin 06/18/2015   Genital herpes 07/31/2014   Ulnar nerve compression 09/21/2013   Noncompliance with diet and medication regimen 11/15/2012   Boil, axilla 08/14/2012   Arthritis    Screening for breast cancer 08/04/2011   Screening for colon cancer 08/04/2011   HIDRADENITIS SUPPURATIVA 04/11/2007   GERD 11/17/2006   ENDOMETRIAL POLYP 11/17/2006   LOW BACK PAIN 11/17/2006   FIBROIDS, UTERUS 11/16/2006   Hyperlipidemia associated with type 2 diabetes mellitus ([place]) 11/16/2006   Essential hypertension 11/16/2006   Past Medical History:  Diagnosis Date   Arthritis    Campylobacter enteritis May 2012   [facility] admission   Carpal tunnel syndrome, bilateral    Diabetes mellitus    Gastritis with bleeding due to alcohol 2010   [facility] admission   GERD (gastroesophageal reflux disease)    Hyperlipidemia    Hypertension    no meds   Menopause  Myocardial infarction [facility])    Neck pain    Wears glasses     Past Surgical History:  Procedure Laterality Date   CARPAL TUNNEL RELEASE Left 05/14/2014   Procedure: LEFT ULNAR  NEUROPLASTY AT ELBOW AND ENDOSCOPIC CARPAL TUNNEL RELEASE;  Surgeon: Jolyn Nap, MD;  Location: [place];  Service: Orthopedics;  Laterality: Left;   CORONARY STENT INTERVENTION N/A 10/09/2019   Procedure: CORONARY STENT INTERVENTION;  Surgeon: Adrian Prows, MD;  Location: [place] [facility];  Service: Cardiovascular;  Laterality: N/A;   CYST EXCISION  1995   under arms   endometrial biopsy  2004   benign   FOOT OSTEOTOMY  2010   right-spurs   LEFT HEART CATH AND CORONARY ANGIOGRAPHY N/A 10/09/2019   Procedure: LEFT HEART CATH AND CORONARY ANGIOGRAPHY;  Surgeon: Adrian Prows, MD;  Location: [place] [facility];  Service: Cardiovascular;  Laterality: N/A;   TONSILLECTOMY     ULNAR NERVE TRANSPOSITION Left 05/14/2014   Procedure: ULNAR NERVE DECOMPRESSION/TRANSPOSITION;  Surgeon: Jolyn Nap, MD;  Location: [place];  Service: Orthopedics;  Laterality: Left;    No current facility-administered medications for this encounter.   Current Outpatient Medications  Medication Sig Dispense Refill Last Dose   Ascorbic Acid (VITAMIN C PO) Take 1 tablet by mouth daily.      aspirin 81 MG chewable tablet Chew 1 tablet (81 mg total) by mouth daily.      FARXIGA 10 MG TABS tablet Take 10 mg by mouth daily.      gabapentin (NEURONTIN) 600 MG tablet Take 600 mg by mouth 3 (three) times daily.      insulin aspart (FIASP FLEXTOUCH) 100 UNIT/ML FlexTouch Pen Inject 6-20 Units into the skin 3 (three) times daily before meals. 30 mL 3    latanoprost (XALATAN) 0.005 % ophthalmic solution Place 1 drop into both eyes at bedtime.      naloxone (NARCAN) nasal spray 4 mg/0.1 mL Place 1 spray into the nose as needed (or).      oxyCODONE-acetaminophen (PERCOCET) 7.5-325 MG tablet Take 1 tablet by mouth every 8 (eight) hours as needed for moderate pain.      OZEMPIC, 2 MG/DOSE, 8 MG/3ML SOPN Inject 2 mg into the skin once a week. Saturday      pantoprazole (PROTONIX) 40 MG tablet  Take 1 tablet (40 mg total) by mouth 2 (two) times daily. 60 tablet 0    TRESIBA FLEXTOUCH 200 UNIT/ML FlexTouch Pen INJECT 30 TO 34 UNITS SUBCUTANEOUSLY ONCE DAILY (Patient taking differently: Inject 8-16 Units into the skin See admin instructions. Take 8 units in the morning and at lunch and 16 units at bedtime) 9 mL 0    triamcinolone cream (KENALOG) 0.1 % Apply 1 Application topically 3 (three) times daily as needed (itching).      vitamin B-12 (CYANOCOBALAMIN) 1000 MCG tablet Take 1,000 mcg by mouth daily.      atorvastatin (LIPITOR) 40 MG tablet Take 1 tablet (40 mg total) by mouth daily. 90 tablet 3 Not Taking   Continuous Blood Gluc Sensor (FREESTYLE LIBRE 3 SENSOR) MISC 1 each by Does not apply route every 14 (fourteen) days. 6 each 3    glucose blood (BAYER CONTOUR TEST) test strip TEST BLOOD SUGARS 3 TIMES DAILY 200 each 11    hydrOXYzine (ATARAX) 25 MG tablet Take 25 mg by mouth 3 (three) times daily as needed. itching   Not Taking   Insulin Pen Needle 32G X  4 MM MISC Use 4x a day 300 each 3    Lancet Devices (EASY TOUCH LANCING DEVICE) MISC Use to check diabetes 4 times daily  E 11.65 1 each 11    lidocaine (XYLOCAINE) 5 % ointment Apply 1 application. topically as needed. 35.44 g 0 Not Taking   metFORMIN (GLUCOPHAGE) 1000 MG tablet Take 1 tablet (1,000 mg total) by mouth daily with supper. 90 tablet 3    methocarbamol (ROBAXIN) 500 MG tablet Take 1 tablet (500 mg total) by mouth every 8 (eight) hours as needed for muscle spasms. 30 tablet 0 Not Taking   metoprolol succinate (TOPROL-XL) 50 MG 24 hr tablet TAKE 1 TABLET BY MOUTH ONCE DAILY. TAKE  WITH  OR  IMMEDIATELY  FOLLOWING  A  MEAL 30 tablet 0    solifenacin (VESICARE) 5 MG tablet Take 5 mg by mouth daily.   Not Taking   Allergies  Allergen Reactions   Oxycodone-Acetaminophen Other (See Comments)    Causes her to feel "hot".   Penicillins Rash    Did it involve swelling of the face/tongue/throat, SOB, or low BP? No Did it  involve sudden or severe rash/hives, skin peeling, or any reaction on the inside of your mouth or nose? No Did you need to seek medical attention at a hospital or doctor's office? No When did it last happen?       If all above answers are "NO", may proceed with cephalosporin use.    Social History   Tobacco Use   Smoking status: Never   Smokeless tobacco: Never  Substance Use Topics   Alcohol use: Not Currently    Family History  Problem Relation Age of Onset   Hypertension Mother    Diabetes Mother    Heart attack Mother    Stroke Father    Diabetes Father    Diabetes Maternal Grandmother    Hypertension Maternal Grandmother    Heart attack Sister      Review of Systems  Musculoskeletal:  Positive for gait problem and joint swelling.  All other systems reviewed and are negative.   Objective:  Physical Exam Vitals reviewed.  Constitutional:      Appearance: Normal appearance. She is normal weight.  HENT:     Head: Normocephalic and atraumatic.  Eyes:     Extraocular Movements: Extraocular movements intact.     Pupils: Pupils are equal, round, and reactive to light.  Cardiovascular:     Rate and Rhythm: Normal rate and regular rhythm.     Pulses: Normal pulses.  Pulmonary:     Effort: Pulmonary effort is normal.     Breath sounds: Normal breath sounds.  Abdominal:     Palpations: Abdomen is soft.  Musculoskeletal:     Cervical back: Normal range of motion and neck supple.     Left knee: Effusion, bony tenderness and crepitus present. Decreased range of motion. Tenderness present over the medial joint line. Abnormal meniscus.  Neurological:     Mental Status: She is alert and oriented to person, place, and time.  Psychiatric:        Behavior: Behavior normal.     Vital signs in last 24 hours: Pulse Rate:  [74] 74 (03/28 1443) BP: (138)/(84) 138/84 (03/28 1443) SpO2:  [99 %] 99 % (03/28 1443) Weight:  [75.3 kg] 75.3 kg (03/28 1443)  Labs:   Estimated  body mass index is 29.41 kg/m as calculated from the following:   Height as of 02/25/23: 5\' 3"  (  1.6 m).   Weight as of 02/25/23: 75.3 kg.   Imaging Review Plain radiographs demonstrate severe degenerative joint disease of the left knee(s). The overall alignment isneutral. The bone quality appears to be excellent for age and reported activity level.      Assessment/Plan:  End stage arthritis, left knee   The patient history, physical examination, clinical judgment of the provider and imaging studies are consistent with end stage degenerative joint disease of the left knee(s) and total knee arthroplasty is deemed medically necessary. The treatment options including medical management, injection therapy arthroscopy and arthroplasty were discussed at length. The risks and benefits of total knee arthroplasty were presented and reviewed. The risks due to aseptic loosening, infection, stiffness, patella tracking problems, thromboembolic complications and other imponderables were discussed. The patient acknowledged the explanation, agreed to proceed with the plan and consent was signed. Patient is being admitted for inpatient treatment for surgery, pain control, PT, OT, prophylactic antibiotics, VTE prophylaxis, progressive ambulation and ADL's and discharge planning. The patient is planning to be discharged home with home health services

## 2023-02-26 ENCOUNTER — Observation Stay (HOSPITAL_COMMUNITY)
Admission: RE | Admit: 2023-02-26 | Discharge: 2023-03-01 | Disposition: A | Discharge status: Home Health | Payer: BC Managed Care – PPO | Source: Ambulatory Visit | Attending: Orthopaedic Surgery | Admitting: Orthopaedic Surgery

## 2023-02-26 ENCOUNTER — Ambulatory Visit (HOSPITAL_COMMUNITY): Payer: BC Managed Care – PPO | Admitting: Physician Assistant

## 2023-02-26 ENCOUNTER — Observation Stay (HOSPITAL_COMMUNITY): Payer: BC Managed Care – PPO

## 2023-02-26 ENCOUNTER — Ambulatory Visit (HOSPITAL_COMMUNITY): Payer: BC Managed Care – PPO | Admitting: Anesthesiology

## 2023-02-26 ENCOUNTER — Encounter (HOSPITAL_COMMUNITY)
Admission: RE | Disposition: A | Discharge status: Home Health | Payer: Self-pay | Source: Ambulatory Visit | Attending: Orthopaedic Surgery

## 2023-02-26 ENCOUNTER — Encounter (HOSPITAL_COMMUNITY): Payer: Self-pay | Admitting: Orthopaedic Surgery

## 2023-02-26 ENCOUNTER — Other Ambulatory Visit: Payer: Self-pay

## 2023-02-26 DIAGNOSIS — Z955 Presence of coronary angioplasty implant and graft: Secondary | ICD-10-CM | POA: Diagnosis not present

## 2023-02-26 DIAGNOSIS — I10 Essential (primary) hypertension: Secondary | ICD-10-CM | POA: Insufficient documentation

## 2023-02-26 DIAGNOSIS — Z7984 Long term (current) use of oral hypoglycemic drugs: Secondary | ICD-10-CM | POA: Diagnosis not present

## 2023-02-26 DIAGNOSIS — I2510 Atherosclerotic heart disease of native coronary artery without angina pectoris: Secondary | ICD-10-CM | POA: Diagnosis not present

## 2023-02-26 DIAGNOSIS — M1712 Unilateral primary osteoarthritis, left knee: Principal | ICD-10-CM | POA: Diagnosis present

## 2023-02-26 DIAGNOSIS — E119 Type 2 diabetes mellitus without complications: Secondary | ICD-10-CM | POA: Diagnosis not present

## 2023-02-26 DIAGNOSIS — Z96652 Presence of left artificial knee joint: Secondary | ICD-10-CM

## 2023-02-26 DIAGNOSIS — Z7982 Long term (current) use of aspirin: Secondary | ICD-10-CM | POA: Diagnosis not present

## 2023-02-26 DIAGNOSIS — Z794 Long term (current) use of insulin: Secondary | ICD-10-CM | POA: Diagnosis not present

## 2023-02-26 HISTORY — PX: TOTAL KNEE ARTHROPLASTY: SHX125

## 2023-02-26 LAB — GLUCOSE, CAPILLARY
Glucose-Capillary: 170 mg/dL — ABNORMAL HIGH (ref 70–99)
Glucose-Capillary: 112 mg/dL — ABNORMAL HIGH (ref 70–99)
Glucose-Capillary: 215 mg/dL — ABNORMAL HIGH (ref 70–99)
Glucose-Capillary: 158 mg/dL — ABNORMAL HIGH (ref 70–99)

## 2023-02-26 LAB — LIPID PANEL
Chol/HDL Ratio: 4.2 ratio (ref 0.0–4.4)
Cholesterol, Total: 216 mg/dL — ABNORMAL HIGH (ref 100–199)
HDL: 51 mg/dL (ref >39)
LDL Chol Calc (NIH): 141 mg/dL — ABNORMAL HIGH (ref 0–99)
Triglycerides: 134 mg/dL (ref 0–149)
VLDL Cholesterol Cal: 24 mg/dL (ref 5–40)

## 2023-02-26 SURGERY — ARTHROPLASTY, KNEE, TOTAL
Anesthesia: Monitor Anesthesia Care | Site: Knee | Laterality: Left

## 2023-02-26 MED ORDER — HYDROXYZINE HCL 25 MG PO TABS
25.0000 mg | ORAL_TABLET | Freq: Three times a day (TID) | ORAL | Status: DC | PRN
  Administered 2023-02-27: 25 mg via ORAL
  Filled 2023-02-26: qty 1

## 2023-02-26 MED ORDER — DEXAMETHASONE SODIUM PHOSPHATE 10 MG/ML IJ SOLN
INTRAMUSCULAR | Status: DC | PRN
  Administered 2023-02-26: 5 mg via INTRAVENOUS

## 2023-02-26 MED ORDER — METFORMIN HCL 500 MG PO TABS: 1000.0000 mg | ORAL_TABLET | Freq: Every day | ORAL | Status: DC

## 2023-02-26 MED ORDER — LATANOPROST 0.005 % OP SOLN
1.0000 [drp] | Freq: Every day | OPHTHALMIC | Status: DC
  Administered 2023-02-26 – 2023-02-28 (×3): 1 [drp] via OPHTHALMIC
  Filled 2023-02-26: qty 2.5

## 2023-02-26 MED ORDER — PROPOFOL 1000 MG/100ML IV EMUL
INTRAVENOUS | Status: AC
  Filled 2023-02-26: qty 100

## 2023-02-26 MED ORDER — MENTHOL 3 MG MT LOZG: 1.0000 | LOZENGE | OROMUCOSAL | Status: DC | PRN

## 2023-02-26 MED ORDER — ALUM & MAG HYDROXIDE-SIMETH 200-200-20 MG/5ML PO SUSP: 30.0000 mL | ORAL | Status: DC | PRN

## 2023-02-26 MED ORDER — ONDANSETRON HCL 4 MG PO TABS: 4.0000 mg | ORAL_TABLET | Freq: Four times a day (QID) | ORAL | Status: DC | PRN

## 2023-02-26 MED ORDER — DOCUSATE SODIUM 100 MG PO CAPS
100.0000 mg | ORAL_CAPSULE | Freq: Two times a day (BID) | ORAL | Status: DC
  Administered 2023-02-26 – 2023-03-01 (×7): 100 mg via ORAL
  Filled 2023-02-26 (×7): qty 1

## 2023-02-26 MED ORDER — ORAL CARE MOUTH RINSE: 15.0000 mL | Freq: Once | OROMUCOSAL | Status: AC

## 2023-02-26 MED ORDER — HYDROMORPHONE HCL 2 MG PO TABS
2.0000 mg | ORAL_TABLET | ORAL | Status: DC | PRN
  Administered 2023-02-26 – 2023-02-27 (×2): 2 mg via ORAL
  Administered 2023-02-27 (×2): 3 mg via ORAL
  Administered 2023-02-28: 2 mg via ORAL
  Filled 2023-02-26: qty 2
  Filled 2023-02-26: qty 1
  Filled 2023-02-26: qty 2
  Filled 2023-02-26 (×3): qty 1

## 2023-02-26 MED ORDER — MIDAZOLAM HCL 2 MG/2ML IJ SOLN
1.0000 mg | Freq: Once | INTRAMUSCULAR | Status: AC
  Administered 2023-02-26: 2 mg via INTRAVENOUS
  Filled 2023-02-26: qty 2

## 2023-02-26 MED ORDER — LACTATED RINGERS IV SOLN: INTRAVENOUS | Status: DC

## 2023-02-26 MED ORDER — ACETAMINOPHEN 325 MG PO TABS
325.0000 mg | ORAL_TABLET | Freq: Four times a day (QID) | ORAL | Status: DC | PRN
  Administered 2023-02-27 – 2023-02-28 (×3): 650 mg via ORAL
  Filled 2023-02-26 (×3): qty 2

## 2023-02-26 MED ORDER — PHENYLEPHRINE HCL-NACL 20-0.9 MG/250ML-% IV SOLN
INTRAVENOUS | Status: DC | PRN
  Administered 2023-02-26 (×2): 30 ug/min via INTRAVENOUS

## 2023-02-26 MED ORDER — BUPIVACAINE-EPINEPHRINE 0.25% -1:200000 IJ SOLN
INTRAMUSCULAR | Status: DC | PRN
  Administered 2023-02-26: 30 mL

## 2023-02-26 MED ORDER — ASPIRIN 81 MG PO CHEW
81.0000 mg | CHEWABLE_TABLET | Freq: Two times a day (BID) | ORAL | Status: DC
  Administered 2023-02-26 – 2023-03-01 (×6): 81 mg via ORAL
  Filled 2023-02-26 (×6): qty 1

## 2023-02-26 MED ORDER — ACETAMINOPHEN 500 MG PO TABS: 1000.0000 mg | ORAL_TABLET | Freq: Once | ORAL | Status: DC | PRN

## 2023-02-26 MED ORDER — INSULIN GLARGINE-YFGN 100 UNIT/ML ~~LOC~~ SOLN
32.0000 [IU] | Freq: Every day | SUBCUTANEOUS | Status: DC
  Administered 2023-02-27 – 2023-03-01 (×3): 32 [IU] via SUBCUTANEOUS
  Filled 2023-02-26 (×3): qty 0.32

## 2023-02-26 MED ORDER — PROPOFOL 500 MG/50ML IV EMUL
INTRAVENOUS | Status: DC | PRN
  Administered 2023-02-26: 90 ug/kg/min via INTRAVENOUS

## 2023-02-26 MED ORDER — FESOTERODINE FUMARATE ER 4 MG PO TB24
4.0000 mg | ORAL_TABLET | Freq: Every day | ORAL | Status: DC
  Administered 2023-02-26 – 2023-03-01 (×4): 4 mg via ORAL
  Filled 2023-02-26 (×4): qty 1

## 2023-02-26 MED ORDER — CHLORHEXIDINE GLUCONATE 0.12 % MT SOLN
15.0000 mL | Freq: Once | OROMUCOSAL | Status: AC
  Administered 2023-02-26: 15 mL via OROMUCOSAL

## 2023-02-26 MED ORDER — ACETAMINOPHEN 160 MG/5ML PO SOLN: 1000.0000 mg | Freq: Once | ORAL | Status: DC | PRN

## 2023-02-26 MED ORDER — SODIUM CHLORIDE 0.9 % IR SOLN
Status: DC | PRN
  Administered 2023-02-26: 1000 mL

## 2023-02-26 MED ORDER — PANTOPRAZOLE SODIUM 40 MG PO TBEC
40.0000 mg | DELAYED_RELEASE_TABLET | Freq: Two times a day (BID) | ORAL | Status: DC
  Administered 2023-02-26 – 2023-03-01 (×6): 40 mg via ORAL
  Filled 2023-02-26 (×6): qty 1

## 2023-02-26 MED ORDER — ATORVASTATIN CALCIUM 40 MG PO TABS
40.0000 mg | ORAL_TABLET | Freq: Every day | ORAL | Status: DC
  Administered 2023-02-26 – 2023-03-01 (×4): 40 mg via ORAL
  Filled 2023-02-26 (×4): qty 1

## 2023-02-26 MED ORDER — ONDANSETRON HCL 4 MG/2ML IJ SOLN
INTRAMUSCULAR | Status: DC | PRN
  Administered 2023-02-26: 4 mg via INTRAVENOUS

## 2023-02-26 MED ORDER — METOPROLOL SUCCINATE ER 50 MG PO TB24
50.0000 mg | ORAL_TABLET | Freq: Every day | ORAL | Status: DC
  Administered 2023-02-27 – 2023-03-01 (×3): 50 mg via ORAL
  Filled 2023-02-26 (×3): qty 1

## 2023-02-26 MED ORDER — EPINEPHRINE PF 1 MG/ML IJ SOLN
INTRAMUSCULAR | Status: AC
  Filled 2023-02-26: qty 1

## 2023-02-26 MED ORDER — SODIUM CHLORIDE 0.9 % IV SOLN: INTRAVENOUS | Status: DC

## 2023-02-26 MED ORDER — PROPOFOL 10 MG/ML IV BOLUS
INTRAVENOUS | Status: DC | PRN
  Administered 2023-02-26: 10 mg via INTRAVENOUS
  Administered 2023-02-26: 30 mg via INTRAVENOUS

## 2023-02-26 MED ORDER — METHOCARBAMOL 500 MG IVPB - SIMPLE MED: 500.0000 mg | Freq: Four times a day (QID) | INTRAVENOUS | Status: DC | PRN

## 2023-02-26 MED ORDER — MIDAZOLAM HCL 2 MG/2ML IJ SOLN
INTRAMUSCULAR | Status: AC
  Filled 2023-02-26: qty 2

## 2023-02-26 MED ORDER — PHENOL 1.4 % MT LIQD: 1.0000 | OROMUCOSAL | Status: DC | PRN

## 2023-02-26 MED ORDER — OXYCODONE HCL 5 MG/5ML PO SOLN: 5.0000 mg | Freq: Once | ORAL | Status: DC | PRN

## 2023-02-26 MED ORDER — ONDANSETRON HCL 4 MG/2ML IJ SOLN
4.0000 mg | Freq: Four times a day (QID) | INTRAMUSCULAR | Status: DC | PRN
  Administered 2023-02-26: 4 mg via INTRAVENOUS
  Filled 2023-02-26: qty 2

## 2023-02-26 MED ORDER — FENTANYL CITRATE (PF) 100 MCG/2ML IJ SOLN
INTRAMUSCULAR | Status: AC
  Filled 2023-02-26: qty 2

## 2023-02-26 MED ORDER — METOCLOPRAMIDE HCL 5 MG PO TABS: 5.0000 mg | ORAL_TABLET | Freq: Three times a day (TID) | ORAL | Status: DC | PRN

## 2023-02-26 MED ORDER — ACETAMINOPHEN 10 MG/ML IV SOLN: 1000.0000 mg | Freq: Once | INTRAVENOUS | Status: DC | PRN

## 2023-02-26 MED ORDER — TRANEXAMIC ACID-NACL 1000-0.7 MG/100ML-% IV SOLN
1000.0000 mg | INTRAVENOUS | Status: AC
  Administered 2023-02-26: 1000 mg via INTRAVENOUS
  Filled 2023-02-26: qty 100

## 2023-02-26 MED ORDER — GABAPENTIN 300 MG PO CAPS
600.0000 mg | ORAL_CAPSULE | Freq: Three times a day (TID) | ORAL | Status: DC
  Administered 2023-02-26 – 2023-03-01 (×9): 600 mg via ORAL
  Filled 2023-02-26 (×9): qty 2

## 2023-02-26 MED ORDER — CEFAZOLIN SODIUM-DEXTROSE 1-4 GM/50ML-% IV SOLN
1.0000 g | Freq: Four times a day (QID) | INTRAVENOUS | Status: AC
  Administered 2023-02-26 (×2): 1 g via INTRAVENOUS
  Filled 2023-02-26 (×2): qty 50

## 2023-02-26 MED ORDER — BUPIVACAINE HCL (PF) 0.25 % IJ SOLN
INTRAMUSCULAR | Status: AC
  Filled 2023-02-26: qty 30

## 2023-02-26 MED ORDER — FENTANYL CITRATE PF 50 MCG/ML IJ SOSY
50.0000 ug | PREFILLED_SYRINGE | Freq: Once | INTRAMUSCULAR | Status: AC
  Administered 2023-02-26: 50 ug via INTRAVENOUS
  Filled 2023-02-26: qty 2

## 2023-02-26 MED ORDER — DEXAMETHASONE SODIUM PHOSPHATE 10 MG/ML IJ SOLN
INTRAMUSCULAR | Status: AC
  Filled 2023-02-26: qty 1

## 2023-02-26 MED ORDER — ONDANSETRON HCL 4 MG/2ML IJ SOLN
INTRAMUSCULAR | Status: AC
  Filled 2023-02-26: qty 2

## 2023-02-26 MED ORDER — DIPHENHYDRAMINE HCL 12.5 MG/5ML PO ELIX: 12.5000 mg | ORAL_SOLUTION | ORAL | Status: DC | PRN

## 2023-02-26 MED ORDER — PHENYLEPHRINE HCL-NACL 20-0.9 MG/250ML-% IV SOLN
INTRAVENOUS | Status: AC
  Filled 2023-02-26: qty 250

## 2023-02-26 MED ORDER — HYDROMORPHONE HCL 2 MG PO TABS
1.0000 mg | ORAL_TABLET | ORAL | Status: DC | PRN
  Administered 2023-02-27 – 2023-03-01 (×7): 2 mg via ORAL
  Filled 2023-02-26 (×6): qty 1

## 2023-02-26 MED ORDER — BUPIVACAINE-EPINEPHRINE (PF) 0.5% -1:200000 IJ SOLN
INTRAMUSCULAR | Status: DC | PRN
  Administered 2023-02-26: 20 mL via PERINEURAL

## 2023-02-26 MED ORDER — POVIDONE-IODINE 10 % EX SWAB
2.0000 | Freq: Once | CUTANEOUS | Status: AC
  Administered 2023-02-26: 2 via TOPICAL

## 2023-02-26 MED ORDER — ACETAMINOPHEN 325 MG PO TABS
325.0000 mg | ORAL_TABLET | Freq: Four times a day (QID) | ORAL | Status: DC | PRN
  Administered 2023-02-26: 650 mg via ORAL
  Filled 2023-02-26: qty 2

## 2023-02-26 MED ORDER — LIDOCAINE HCL (PF) 2 % IJ SOLN
INTRAMUSCULAR | Status: AC
  Filled 2023-02-26: qty 5

## 2023-02-26 MED ORDER — DAPAGLIFLOZIN PROPANEDIOL 10 MG PO TABS
10.0000 mg | ORAL_TABLET | Freq: Every day | ORAL | Status: DC
  Administered 2023-02-27 – 2023-03-01 (×3): 10 mg via ORAL
  Filled 2023-02-26 (×3): qty 1

## 2023-02-26 MED ORDER — 0.9 % SODIUM CHLORIDE (POUR BTL) OPTIME
TOPICAL | Status: DC | PRN
  Administered 2023-02-26: 1000 mL

## 2023-02-26 MED ORDER — METOCLOPRAMIDE HCL 5 MG/ML IJ SOLN: 5.0000 mg | Freq: Three times a day (TID) | INTRAMUSCULAR | Status: DC | PRN

## 2023-02-26 MED ORDER — BUPIVACAINE IN DEXTROSE 0.75-8.25 % IT SOLN
INTRATHECAL | Status: DC | PRN
  Administered 2023-02-26: 1.8 mL via INTRATHECAL

## 2023-02-26 MED ORDER — OXYCODONE HCL 5 MG PO TABS: 5.0000 mg | ORAL_TABLET | Freq: Once | ORAL | Status: DC | PRN

## 2023-02-26 MED ORDER — METHOCARBAMOL 500 MG PO TABS
500.0000 mg | ORAL_TABLET | Freq: Four times a day (QID) | ORAL | Status: DC | PRN
  Administered 2023-02-26 – 2023-03-01 (×10): 500 mg via ORAL
  Filled 2023-02-26 (×10): qty 1

## 2023-02-26 MED ORDER — INSULIN ASPART 100 UNIT/ML IJ SOLN
0.0000 [IU] | Freq: Three times a day (TID) | INTRAMUSCULAR | Status: DC
  Administered 2023-02-26: 5 [IU] via SUBCUTANEOUS
  Administered 2023-02-27: 3 [IU] via SUBCUTANEOUS
  Administered 2023-02-27 – 2023-03-01 (×5): 2 [IU] via SUBCUTANEOUS

## 2023-02-26 MED ORDER — HYDROMORPHONE HCL 1 MG/ML IJ SOLN
0.5000 mg | INTRAMUSCULAR | Status: DC | PRN
  Administered 2023-02-26 – 2023-02-28 (×3): 1 mg via INTRAVENOUS
  Filled 2023-02-26 (×3): qty 1

## 2023-02-26 MED ORDER — CEFAZOLIN SODIUM-DEXTROSE 2-4 GM/100ML-% IV SOLN
2.0000 g | INTRAVENOUS | Status: AC
  Administered 2023-02-26: 2 g via INTRAVENOUS
  Filled 2023-02-26: qty 100

## 2023-02-26 MED ORDER — INSULIN DEGLUDEC 200 UNIT/ML ~~LOC~~ SOPN: PEN_INJECTOR | SUBCUTANEOUS | Status: DC

## 2023-02-26 MED ORDER — FENTANYL CITRATE PF 50 MCG/ML IJ SOSY: 25.0000 ug | PREFILLED_SYRINGE | INTRAMUSCULAR | Status: DC | PRN

## 2023-02-26 SURGICAL SUPPLY — 67 items
APL SKNCLS STERI-STRIP NONHPOA (GAUZE/BANDAGES/DRESSINGS)
BAG COUNTER SPONGE SURGICOUNT (BAG) IMPLANT
BAG SPEC THK2 15X12 ZIP CLS (MISCELLANEOUS) ×1
BAG SPNG CNTER NS LX DISP (BAG) ×1
BAG ZIPLOCK 12X15 (MISCELLANEOUS) ×1 IMPLANT
BENZOIN TINCTURE PRP APPL 2/3 (GAUZE/BANDAGES/DRESSINGS) IMPLANT
BLADE SAG 18X100X1.27 (BLADE) ×1 IMPLANT
BLADE SAW SGTL 11.0X1.19X90.0M (BLADE) IMPLANT
BLADE SURG SZ10 CARB STEEL (BLADE) ×2 IMPLANT
BNDG CMPR 5X62 HK CLSR LF (GAUZE/BANDAGES/DRESSINGS) ×2
BNDG CMPR MED 10X6 ELC LF (GAUZE/BANDAGES/DRESSINGS) ×2
BNDG ELASTIC 6INX 5YD STR LF (GAUZE/BANDAGES/DRESSINGS) ×2 IMPLANT
BNDG ELASTIC 6X10 VLCR STRL LF (GAUZE/BANDAGES/DRESSINGS) IMPLANT
BOWL SMART MIX CTS (DISPOSABLE) IMPLANT
COMP FEM KNEE PS STD 6 LT (Joint) ×1 IMPLANT
COMP MED POLY AS PERS S6-7 12 (Joint) ×1 IMPLANT
COMP PATELLA NEXG KNEE 29 10 (Joint) ×1 IMPLANT
COMPONENT FEM KNEE PS STD 6 LT (Joint) IMPLANT
COMPONENT MED PLY PERSS6-7 12 (Joint) IMPLANT
COMPONENT PTLLA NEXG KN 29 10 (Joint) IMPLANT
COOLER ICEMAN CLASSIC (MISCELLANEOUS) ×1 IMPLANT
COVER SURGICAL LIGHT HANDLE (MISCELLANEOUS) ×1 IMPLANT
CUFF TOURN SGL QUICK 34 (TOURNIQUET CUFF) ×1
CUFF TRNQT CYL 34X4.125X (TOURNIQUET CUFF) ×1 IMPLANT
DRAPE INCISE IOBAN 66X45 STRL (DRAPES) ×1 IMPLANT
DRAPE U-SHAPE 47X51 STRL (DRAPES) ×1 IMPLANT
DURAPREP 26ML APPLICATOR (WOUND CARE) ×1 IMPLANT
ELECT BLADE TIP CTD 4 INCH (ELECTRODE) ×1 IMPLANT
ELECT REM PT RETURN 15FT ADLT (MISCELLANEOUS) ×1 IMPLANT
FILTER STRAW (MISCELLANEOUS) IMPLANT
GAUZE PAD ABD 8X10 STRL (GAUZE/BANDAGES/DRESSINGS) ×2 IMPLANT
GAUZE SPONGE 4X4 12PLY STRL (GAUZE/BANDAGES/DRESSINGS) ×1 IMPLANT
GAUZE XEROFORM 1X8 LF (GAUZE/BANDAGES/DRESSINGS) IMPLANT
GLOVE BIO SURGEON STRL SZ7.5 (GLOVE) ×1 IMPLANT
GLOVE BIOGEL PI IND STRL 8 (GLOVE) ×2 IMPLANT
GLOVE ECLIPSE 8.0 STRL XLNG CF (GLOVE) ×1 IMPLANT
GOWN STRL REUS W/ TWL XL LVL3 (GOWN DISPOSABLE) ×2 IMPLANT
GOWN STRL REUS W/TWL XL LVL3 (GOWN DISPOSABLE) ×2
HANDPIECE INTERPULSE COAX TIP (DISPOSABLE) ×1
HDLS TROCR DRIL PIN KNEE 75 (PIN) ×1
HOLDER FOLEY CATH W/STRAP (MISCELLANEOUS) IMPLANT
IMMOBILIZER KNEE 20 (SOFTGOODS) ×1
IMMOBILIZER KNEE 20 THIGH 36 (SOFTGOODS) ×1 IMPLANT
KIT TURNOVER KIT A (KITS) IMPLANT
NS IRRIG 1000ML POUR BTL (IV SOLUTION) ×1 IMPLANT
PACK TOTAL KNEE CUSTOM (KITS) ×1 IMPLANT
PAD COLD SHLDR WRAP-ON (PAD) ×1 IMPLANT
PADDING CAST COTTON 6X4 STRL (CAST SUPPLIES) ×2 IMPLANT
PIN DRILL HDLS TROCAR 75 4PK (PIN) IMPLANT
PROTECTOR NERVE ULNAR (MISCELLANEOUS) ×1 IMPLANT
SCREW FEMALE HEX FIX 25X2.5 (ORTHOPEDIC DISPOSABLE SUPPLIES) IMPLANT
SET HNDPC FAN SPRY TIP SCT (DISPOSABLE) ×1 IMPLANT
SET PAD KNEE POSITIONER (MISCELLANEOUS) ×1 IMPLANT
SPIKE FLUID TRANSFER (MISCELLANEOUS) IMPLANT
STAPLER VISISTAT 35W (STAPLE) IMPLANT
STEM TIB PS KNEE D 0D LT (Stem) IMPLANT
STRIP CLOSURE SKIN 1/2X4 (GAUZE/BANDAGES/DRESSINGS) IMPLANT
SUT MNCRL AB 4-0 PS2 18 (SUTURE) IMPLANT
SUT VIC AB 0 CT1 27 (SUTURE) ×1
SUT VIC AB 0 CT1 27XBRD ANTBC (SUTURE) ×1 IMPLANT
SUT VIC AB 1 CT1 36 (SUTURE) ×2 IMPLANT
SUT VIC AB 2-0 CT1 27 (SUTURE) ×2
SUT VIC AB 2-0 CT1 TAPERPNT 27 (SUTURE) ×2 IMPLANT
SYR 27GX1/2 1ML LL SAFETY (SYRINGE) IMPLANT
TRAY FOLEY MTR SLVR 14FR STAT (SET/KITS/TRAYS/PACK) IMPLANT
TRAY FOLEY MTR SLVR 16FR STAT (SET/KITS/TRAYS/PACK) IMPLANT
WATER STERILE IRR 1000ML POUR (IV SOLUTION) ×2 IMPLANT

## 2023-02-26 NOTE — Plan of Care (Signed)
Problem: Education: Goal: Ability to describe self-care measures that may prevent or decrease complications (Diabetes Survival Skills Education) will improve 02/26/2023 2307 by Orbie Pyo, RN Outcome: Progressing 02/26/2023 2307 by Orbie Pyo, RN Outcome: Progressing Goal: Individualized Educational Video(s) 02/26/2023 2307 by Orbie Pyo, RN Outcome: Progressing 02/26/2023 2307 by Orbie Pyo, RN Outcome: Progressing   Problem: Coping: Goal: Ability to adjust to condition or change in health will improve 02/26/2023 2307 by Orbie Pyo, RN Outcome: Progressing 02/26/2023 2307 by Orbie Pyo, RN Outcome: Progressing   Problem: Fluid Volume: Goal: Ability to maintain a balanced intake and output will improve 02/26/2023 2307 by Orbie Pyo, RN Outcome: Progressing 02/26/2023 2307 by Orbie Pyo, RN Outcome: Progressing   Problem: Health Behavior/Discharge Planning: Goal: Ability to identify and utilize available resources and services will improve 02/26/2023 2307 by Orbie Pyo, RN Outcome: Progressing 02/26/2023 2307 by Orbie Pyo, RN Outcome: Progressing Goal: Ability to manage health-related needs will improve 02/26/2023 2307 by Orbie Pyo, RN Outcome: Progressing 02/26/2023 2307 by Orbie Pyo, RN Outcome: Progressing   Problem: Metabolic: Goal: Ability to maintain appropriate glucose levels will improve 02/26/2023 2307 by Orbie Pyo, RN Outcome: Progressing 02/26/2023 2307 by Orbie Pyo, RN Outcome: Progressing   Problem: Nutritional: Goal: Maintenance of adequate nutrition will improve 02/26/2023 2307 by Orbie Pyo, RN Outcome: Progressing 02/26/2023 2307 by Orbie Pyo, RN Outcome: Progressing Goal: Progress toward achieving an optimal weight will improve 02/26/2023 2307 by Orbie Pyo, RN Outcome: Progressing 02/26/2023 2307 by Orbie Pyo, RN Outcome: Progressing   Problem: Skin  Integrity: Goal: Risk for impaired skin integrity will decrease 02/26/2023 2307 by Orbie Pyo, RN Outcome: Progressing 02/26/2023 2307 by Orbie Pyo, RN Outcome: Progressing   Problem: Tissue Perfusion: Goal: Adequacy of tissue perfusion will improve 02/26/2023 2307 by Orbie Pyo, RN Outcome: Progressing 02/26/2023 2307 by Orbie Pyo, RN Outcome: Progressing   Problem: Education: Goal: Knowledge of the prescribed therapeutic regimen will improve 02/26/2023 2307 by Orbie Pyo, RN Outcome: Progressing 02/26/2023 2307 by Orbie Pyo, RN Outcome: Progressing Goal: Individualized Educational Video(s) 02/26/2023 2307 by Orbie Pyo, RN Outcome: Progressing 02/26/2023 2307 by Orbie Pyo, RN Outcome: Progressing   Problem: Activity: Goal: Ability to avoid complications of mobility impairment will improve 02/26/2023 2307 by Orbie Pyo, RN Outcome: Progressing 02/26/2023 2307 by Orbie Pyo, RN Outcome: Progressing Goal: Range of joint motion will improve 02/26/2023 2307 by Orbie Pyo, RN Outcome: Progressing 02/26/2023 2307 by Orbie Pyo, RN Outcome: Progressing   Problem: Clinical Measurements: Goal: Postoperative complications will be avoided or minimized 02/26/2023 2307 by Orbie Pyo, RN Outcome: Progressing 02/26/2023 2307 by Orbie Pyo, RN Outcome: Progressing   Problem: Pain Management: Goal: Pain level will decrease with appropriate interventions 02/26/2023 2307 by Orbie Pyo, RN Outcome: Progressing 02/26/2023 2307 by Orbie Pyo, RN Outcome: Progressing   Problem: Skin Integrity: Goal: Will show signs of wound healing 02/26/2023 2307 by Orbie Pyo, RN Outcome: Progressing 02/26/2023 2307 by Orbie Pyo, RN Outcome: Progressing   Problem: Education: Goal: Knowledge of General Education information will improve Description: Including pain rating scale, medication(s)/side effects and  non-pharmacologic comfort measures 02/26/2023 2307 by Orbie Pyo, RN Outcome: Progressing 02/26/2023 2307 by Orbie Pyo, RN Outcome: Progressing   Problem: Health Behavior/Discharge Planning: Goal: Ability to manage health-related needs will improve 02/26/2023 2307 by  Orbie Pyo, RN Outcome: Progressing 02/26/2023 2307 by Orbie Pyo, RN Outcome: Progressing   Problem: Clinical Measurements: Goal: Ability to maintain clinical measurements within normal limits will improve 02/26/2023 2307 by Orbie Pyo, RN Outcome: Progressing 02/26/2023 2307 by Orbie Pyo, RN Outcome: Progressing Goal: Will remain free from infection 02/26/2023 2307 by Orbie Pyo, RN Outcome: Progressing 02/26/2023 2307 by Orbie Pyo, RN Outcome: Progressing Goal: Diagnostic test results will improve 02/26/2023 2307 by Orbie Pyo, RN Outcome: Progressing 02/26/2023 2307 by Orbie Pyo, RN Outcome: Progressing Goal: Respiratory complications will improve 02/26/2023 2307 by Orbie Pyo, RN Outcome: Progressing 02/26/2023 2307 by Orbie Pyo, RN Outcome: Progressing Goal: Cardiovascular complication will be avoided 02/26/2023 2307 by Orbie Pyo, RN Outcome: Progressing 02/26/2023 2307 by Orbie Pyo, RN Outcome: Progressing   Problem: Activity: Goal: Risk for activity intolerance will decrease 02/26/2023 2307 by Orbie Pyo, RN Outcome: Progressing 02/26/2023 2307 by Orbie Pyo, RN Outcome: Progressing   Problem: Nutrition: Goal: Adequate nutrition will be maintained 02/26/2023 2307 by Orbie Pyo, RN Outcome: Progressing 02/26/2023 2307 by Orbie Pyo, RN Outcome: Progressing   Problem: Coping: Goal: Level of anxiety will decrease 02/26/2023 2307 by Orbie Pyo, RN Outcome: Progressing 02/26/2023 2307 by Orbie Pyo, RN Outcome: Progressing   Problem: Elimination: Goal: Will not experience complications related to bowel  motility 02/26/2023 2307 by Orbie Pyo, RN Outcome: Progressing 02/26/2023 2307 by Orbie Pyo, RN Outcome: Progressing Goal: Will not experience complications related to urinary retention 02/26/2023 2307 by Orbie Pyo, RN Outcome: Progressing 02/26/2023 2307 by Orbie Pyo, RN Outcome: Progressing   Problem: Pain Managment: Goal: General experience of comfort will improve 02/26/2023 2307 by Orbie Pyo, RN Outcome: Progressing 02/26/2023 2307 by Orbie Pyo, RN Outcome: Progressing   Problem: Safety: Goal: Ability to remain free from injury will improve 02/26/2023 2307 by Orbie Pyo, RN Outcome: Progressing 02/26/2023 2307 by Orbie Pyo, RN Outcome: Progressing   Problem: Skin Integrity: Goal: Risk for impaired skin integrity will decrease 02/26/2023 2307 by Orbie Pyo, RN Outcome: Progressing 02/26/2023 2307 by Orbie Pyo, RN Outcome: Progressing

## 2023-02-26 NOTE — Op Note (Signed)
Operative Note  Date of operation: 02/26/2023 Preoperative diagnosis: Left knee primary osteoarthritis Postoperative diagnosis: Same  Procedure: Left press-fit total knee arthroplasty  Implants: Biomet/Zimmer persona press-fit knee system Implant Name Type Inv. Item Serial No. Manufacturer Lot No. LRB No. Used Action  COMP PATELLA NEXG KNEE 29 10 - [id number] Joint COMP PATELLA NEXG KNEE 29 10  ZIMMER [employer]ON(ORTH,TRAU,BIO,SG) [id number] Left 1 Implanted  COMP MED POLY AS PERS S6-7 12 - [id number] Joint COMP MED POLY AS PERS S6-7 12  ZIMMER RECON(ORTH,TRAU,BIO,SG) [id number] Left 1 Implanted  COMP FEM KNEE PS STD 6 LT - [id number] Joint COMP FEM KNEE PS STD 6 LT  ZIMMER RECON(ORTH,TRAU,BIO,SG) [id number] Left 1 Implanted  STEM TIB PS KNEE D 0D LT - [id number] Stem STEM TIB PS KNEE D 0D LT  ZIMMER RECON(ORTH,TRAU,BIO,SG) [id number] Left 1 Implanted   Surgeon: Lind Guest. Ninfa Linden, MD Assistant: Vicki Mallet RNFA  Anesthesia: #1 left lower extremity adductor canal block, #2 spinal, #3 local EBL: Less than 100 cc Tourniquet time: Under 1 hour Antibiotics: IV Ancef Complications: None  Indications: The patient is a 62 year old female with well-documented arthritis involving her left knee.  She has tried and failed conservative treatment for many years on the knee including activity modification, quad strengthening exercises, physical therapy, anti-inflammatories, steroid injections and hyaluronic acid injections.  She is at the point where her knee pain is daily and it is detrimentally affecting her mobility, her quality of life, and her actives daily living.  Combined with her clinical exam findings as well as x-ray findings and the failure conservative treatment for over 12 months, we recommended a total knee arthroplasty.  The risk and benefits of surgery were described in detail including the risk of acute blood loss anemia, nerve posterior injury, fracture, infection, DVT, implant failure,  wound healing issues.  She understands her goals are hopefully decrease pain, improve mobility, and improve quality of life.  Procedure description: After informed consent was obtained and the appropriate left knee was marked, anesthesia obtained a left lower extremity adductor canal block in the holding room.  The patient was then brought to the operating room and set up on the operating table where spinal anesthesia was obtained.  She was laid in a supine position on the operating table and a Foley catheter was placed.  A nonsterile tourniquet is placed around her upper left thigh and her left thigh, knee, leg and ankle were prepped and draped in DuraPrep and sterile drapes including a sterile stockinette.  A timeout was called and she was identified as the correct patient the correct left knee.  An Esmarch was then used to wrap out the leg and the tourniquet was inflated to 300 mm of pressure.  With the knee extended a direct midline incision was made over the patella and carried proximally distally.  Dissection was carried down to the knee joint and a medial parapatellar arthrotomy was made finding a moderate joint effusion.  With the knee in a flexed position we could see that there was areas of full-thickness cartilage loss in the medial femoral condyle and the patellofemoral joint.  There was mild cartilage loss laterally.  We removed the ACL as well as medial lateral meniscus and osteophytes of all 3 compartments.  We then used an extramedullary cutting guide for making her proximal tibia cut correction varus and valgus and 70 degree slope.  We made this cut without difficulty.  We did find good quality bone so we felt it was prudent to proceed  with press-fit implants given her young age.  She is also a thin individual.  We then went to the femur and used a intramedullary cutting guide for making her distal femoral cut for a left knee at 5 degrees externally rotated and a 10 mm distal femoral cut.  We made  that cut without difficulty and brought the knee back down to full extension and achieve full extension with a 10 mm extension block.  We then went back to the femur and chose our femoral sizing guide based off the epicondylar axis.  From this we chose a size 6 femur.  We put a 4-in-1 cutting block for a size 6 femur and made her anterior posterior cuts followed by her chamfer cuts.  We then went back to the tibia and chose a size D left tibial tray for coverage over the tibial plateau setting the rotation off the tibial tubercle and the femur.  We did our drill hole and keel punch off of this and set this up for press-fit implants.  We then trialed our size D left tibia followed by our size 6 left CR standard femur.  We placed a 10 mm medial congruent polythene insert and went up to a 12 mm insert.  We then made a patella cut and drilled a single hole for a size 29 press-fit patella button.  With all transportation the knee we put her through several cycles of range of motion and we are pleased with range of motion and stability.  We then removed all trial instruments from the knee and irrigate the knee with normal saline solution.  We dried the knee roll well and then placed our Marcaine with epinephrine around the arthrotomy.  Next with the knee in a flexed position we placed our Biomet/Zimmer press-fit tibial tray for left knee size D followed by placing her press-fit size 6 left standard CR femur.  We placed our 12 mm medial congruent fixed-bearing polythene insert for left knee and press-fit our size 29 patella button.  Again we put the knee through several cycles of motion and we were pleased with range of motion and stability.  The tourniquet was let down hemostasis was obtained with electrocautery.  The arthrotomy was closed with interrupted #1 Vicryl suture followed by 0 Vicryl close deep tissue and 2-0 Vicryl to close subcutaneous tissue.  The skin was closed with staples.  Well-padded sterile dressings  applied.  The patient was taken recovery room in stable addition with all final counts being correct and no complications noted.

## 2023-02-26 NOTE — Anesthesia Procedure Notes (Addendum)
Anesthesia Regional Block: Adductor canal block   Pre-Anesthetic Checklist: , timeout performed,  Correct Patient, Correct Site, Correct Laterality,  Correct Procedure, Correct Position, site marked,  Risks and benefits discussed,  Surgical consent,  Pre-op evaluation,  At surgeon's request and post-op pain management  Laterality: Left  Prep: chloraprep       Needles:  Injection technique: Single-shot      Needle Length: 9cm  Needle Gauge: 22     Additional Needles: Arrow StimuQuik ECHO Echogenic Stimulating PNB Needle  Procedures:,,,, ultrasound used (permanent image in chart),,    Narrative:  Start time: 02/26/2023 10:33 AM End time: 02/26/2023 10:37 AM Injection made incrementally with aspirations every 5 mL.  Performed by: Personally  Anesthesiologist: Oleta Mouse, MD

## 2023-02-26 NOTE — Interval H&P Note (Signed)
History and Physical Interval Note: The patient understands that she is here today for a left knee replacement to treat her severe left knee arthritis.  There has been no acute or interval change in her medical status.  See recent H&P.  The risks and benefits of surgery been discussed in detail and informed consent is obtained.  The left operative hip has been marked.  02/26/2023 7:02 AM  [name]  has presented today for surgery, with the diagnosis of osteoarthritis left knee.  The various methods of treatment have been discussed with the patient and family. After consideration of risks, benefits and other options for treatment, the patient has consented to  Procedure(s) with comments: LEFT TOTAL KNEE ARTHROPLASTY (Left) - Needs RNFA as a surgical intervention.  The patient's history has been reviewed, patient examined, no change in status, stable for surgery.  I have reviewed the patient's chart and labs.  Questions were answered to the patient's satisfaction.     Mcarthur Rossetti

## 2023-02-26 NOTE — Anesthesia Postprocedure Evaluation (Signed)
Anesthesia Post Note  Patient: [name]  Procedure(s) Performed: LEFT TOTAL KNEE ARTHROPLASTY (Left: Knee)     Patient location during evaluation: PACU Anesthesia Type: Regional, MAC and Spinal Level of consciousness: awake and patient cooperative Pain management: pain level controlled Vital Signs Assessment: post-procedure vital signs reviewed and stable Respiratory status: spontaneous breathing, nonlabored ventilation, respiratory function stable and patient connected to nasal cannula oxygen Cardiovascular status: stable and blood pressure returned to baseline Postop Assessment: no apparent nausea or vomiting Anesthetic complications: no   No notable events documented.  Last Vitals:  Vitals:   02/26/23 1130 02/26/23 1212  BP: (!) 159/82 (!) 160/82  Pulse: 80 83  Resp: 17 18  Temp: 36.7 C (!) 36.4 C  SpO2: 97% 99%    Last Pain:  Vitals:   02/26/23 1331  TempSrc:   PainSc: 10-Worst pain ever                 [name]

## 2023-02-26 NOTE — Transfer of Care (Signed)
Immediate Anesthesia Transfer of Care Note  Patient: [name]  Procedure(s) Performed: LEFT TOTAL KNEE ARTHROPLASTY (Left: Knee)  Patient Location: PACU  Anesthesia Type:Spinal  Level of Consciousness: awake, alert , oriented, and patient cooperative  Airway & Oxygen Therapy: Patient Spontanous Breathing and Patient connected to face mask oxygen  Post-op Assessment: Report given to RN and Post -op Vital signs reviewed and stable  Post vital signs: Reviewed and stable  Last Vitals:  Vitals Value Taken Time  BP 105/71 02/26/23 1048  Temp    Pulse 92 02/26/23 1050  Resp 20 02/26/23 1050  SpO2 98 % 02/26/23 1050  Vitals shown include unvalidated device data.  Last Pain:  Vitals:   02/26/23 0710  TempSrc:   PainSc: 6          Complications: No notable events documented.

## 2023-02-26 NOTE — Anesthesia Preprocedure Evaluation (Signed)
Anesthesia Evaluation  Patient identified by MRN, date of birth, ID band Patient awake    Reviewed: Allergy & Precautions, NPO status , Patient's Chart, lab work & pertinent test results  History of Anesthesia Complications Negative for: history of anesthetic complications  Airway Mallampati: III  TM Distance: >3 FB Neck ROM: Full    Dental  (+) Dental Advisory Given   Pulmonary neg pulmonary ROS   breath sounds clear to auscultation       Cardiovascular hypertension, Pt. on medications (-) angina + CAD, + Past MI and + Cardiac Stents   Rhythm:Regular   1. Left ventricular ejection fraction, by visual estimation, is 50 to  55%. The left ventricle has low normal function. There is mildly increased  left ventricular hypertrophy. Basal to mid inferolateral hypokinesis   2. The average left ventricular global longitudinal strain is -14.9 %.   3. Global right ventricle has normal systolic function.The right  ventricular size is normal. No increase in right ventricular wall  thickness.   4. Left atrial size was normal.   5. Right atrial size was normal.   6. The mitral valve is normal in structure. No evidence of mitral valve  regurgitation.   7. The tricuspid valve is normal in structure. Tricuspid valve  regurgitation is not demonstrated.   8. The aortic valve is tricuspid. Aortic valve regurgitation is not  visualized. No evidence of aortic valve sclerosis or stenosis.   9. The pulmonic valve was not well visualized. Pulmonic valve  regurgitation is not visualized.  10. The inferior vena cava is normal in size with greater than 50%  respiratory variability, suggesting right atrial pressure of 3 mmHg.  11. The interatrial septum was not well visualized.    Hyperdynamic LV, EF 65 to 70%, EDP normal. RCA is a dominant vessel.  Ulcerated tandem 80% followed by a 99% stenosis.  SP 2.5 x 38 and a 2.5 x 8 mm resolute Onyx DES stents  implanted, second stent used for distal edge dissection.  Stenosis reduced to 0% with TIMI-3 to TIMI-3 flow. Circumflex coronary artery is smooth with minimal disease.  Gives origin to large OM1 and OM 2.  Tortuous. LAD is diffusely diseased from the mid to distal segment.  There is a hazy 70 to 80% stenosis in the mid LAD.  Following which distal LAD had a 40% diffuse disease.  Mild calcification is evident.  SP 2.24 x 15 mm resolute Onyx DES postdilated with 2.25 x 8 mm [place] at 22 atmospheric pressure with 0% residual stenosis.  TIMI-3 to TIMI-3 flow.      Neuro/Psych  Neuromuscular disease  negative psych ROS   GI/Hepatic Neg liver ROS,GERD  Medicated,,  Endo/Other  diabetes, Insulin Dependent    Renal/GU negative Renal ROSLab Results      Component                Value               Date                      CREATININE               0.61                02/15/2023                 Musculoskeletal   Abdominal   Peds  Hematology Lab Results  Component                Value               Date                      WBC                      9.9                 02/15/2023                HGB                      13.8                02/15/2023                HCT                      43.7                02/15/2023                MCV                      92.8                02/15/2023                PLT                      315                 02/15/2023            Lab Results      Component                Value               Date                      INR                      1.0                 10/14/2019            Denies blood thinners    Anesthesia Other Findings   Reproductive/Obstetrics                              Anesthesia Physical Anesthesia Plan  ASA: 3  Anesthesia Plan: MAC, Regional and Spinal   Post-op Pain Management: Ofirmev IV (intra-op)*   Induction:   PONV Risk Score and Plan: 2 and Propofol infusion and Treatment may  vary due to age or medical condition  Airway Management Planned: Nasal Cannula, Natural Airway and Simple Face Mask  Additional Equipment: None  Intra-op Plan:   Post-operative Plan:   Informed Consent: I have reviewed the patients History and Physical, chart, labs and discussed the procedure including the risks, benefits and alternatives for the proposed anesthesia with the patient or authorized representative who has indicated his/her understanding and acceptance.     Dental advisory given  Plan Discussed with: CRNA  Anesthesia Plan Comments:  Anesthesia Quick Evaluation  

## 2023-02-26 NOTE — Anesthesia Procedure Notes (Addendum)
Spinal  Patient location during procedure: OR Start time: 02/26/2023 8:56 AM End time: 02/26/2023 9:00 AM Reason for block: surgical anesthesia Staffing Anesthesiologist: Oleta Mouse, MD Performed by: Oleta Mouse, MD Authorized by: Oleta Mouse, MD   Preanesthetic Checklist Completed: patient identified, IV checked, risks and benefits discussed, surgical consent, monitors and equipment checked, pre-op evaluation and timeout performed Spinal Block Patient position: sitting Prep: DuraPrep Patient monitoring: heart rate, cardiac monitor, continuous pulse ox and blood pressure Approach: midline Location: L4-5 Injection technique: single-shot Needle Needle type: Pencan  Needle gauge: 24 G Needle length: 9 cm Assessment Sensory level: T6 Events: CSF return and second provider

## 2023-02-26 NOTE — Evaluation (Signed)
Physical Therapy Evaluation Patient Details Name: [name] MRN: [id number] DOB: 1961/01/08 Today's Date: 02/26/2023  History of Present Illness  Pt is a 62 year old female s/p Left TKA on 02/26/23.  Clinical Impression  Pt is s/p TKA resulting in the deficits listed below (see PT Problem List).  Pt will benefit from acute skilled PT to increase their independence and safety with mobility to allow discharge.  Pt assisted with ambulating short distance in hallway.  Pt reports pain and nausea limiting mobility at this time (RN notified).  Pt agreeable to remain OOB in recliner end of session.  Pt anticipates d/c home with assist from daughter and reports f/u with HHPT.         Recommendations for follow up therapy are one component of a multi-disciplinary discharge planning process, led by the attending physician.  Recommendations may be updated based on patient status, additional functional criteria and insurance authorization.  Follow Up Recommendations       Assistance Recommended at Discharge PRN  Patient can return home with the following  A little help with bathing/dressing/bathroom;A little help with walking and/or transfers;Help with stairs or ramp for entrance    Equipment Recommendations None recommended by PT  Recommendations for Other Services       Functional Status Assessment Patient has had a recent decline in their functional status and demonstrates the ability to make significant improvements in function in a reasonable and predictable amount of time.     Precautions / Restrictions Precautions Precautions: Fall;Knee Required Braces or Orthoses: Knee Immobilizer - Left Restrictions Weight Bearing Restrictions: No Other Position/Activity Restrictions: WBAT      Mobility  Bed Mobility Overal bed mobility: Needs Assistance Bed Mobility: Supine to Sit     Supine to sit: Min assist     General bed mobility comments: assist for Lt LE     Transfers Overall transfer level: Needs assistance Equipment used: Rolling walker (2 wheels) Transfers: Sit to/from Stand Sit to Stand: Min guard           General transfer comment: verbal cues for UE and LE positioning    Ambulation/Gait Ambulation/Gait assistance: Min guard Gait Distance (Feet): 35 Feet Assistive device: Rolling walker (2 wheels) Gait Pattern/deviations: Step-to pattern, Antalgic, Decreased stance time - left Gait velocity: decr     General Gait Details: verbal cues for sequence, RW positioning, step length, distance per pt tolerance, pt reported nausea end of ambulation Investment banker, corporate notified)  Stairs            Wheelchair Mobility    Modified Rankin (Stroke Patients Only)       Balance                                             Pertinent Vitals/Pain Pain Assessment Pain Assessment: 0-10 Pain Score: 4  Pain Location: left knee and headache Pain Descriptors / Indicators: Sore, Aching Pain Intervention(s): Repositioned, Monitored during session, Premedicated before session, Patient requesting pain meds-RN notified    Home Living Family/patient expects to be discharged to:: Private residence Living Arrangements: Children Available Help at Discharge: Family Type of Home: House Home Access: Stairs to enter Entrance Stairs-Rails: None Entrance Stairs-Number of Steps: 4   Home Layout: One level Home Equipment: Conservation officer, nature (2 wheels)      Prior Function Prior Level of Function : Independent/Modified Independent  Hand Dominance        Extremity/Trunk Assessment        Lower Extremity Assessment Lower Extremity Assessment: LLE deficits/detail LLE Deficits / Details: unable to perform SLR, maintained KI, pt reports some numbness still in foot but also had ice machine around calf on arrival to room       Communication   Communication: No difficulties  Cognition Arousal/Alertness:  Awake/alert Behavior During Therapy: WFL for tasks assessed/performed Overall Cognitive Status: Within Functional Limits for tasks assessed                                          General Comments      Exercises     Assessment/Plan    PT Assessment Patient needs continued PT services  PT Problem List Decreased strength;Decreased mobility;Decreased activity tolerance;Decreased range of motion;Pain;Decreased knowledge of use of DME       PT Treatment Interventions Stair training;Gait training;DME instruction;Therapeutic exercise;Balance training;Functional mobility training;Therapeutic activities;Patient/family education    PT Goals (Current goals can be found in the Care Plan section)  Acute Rehab PT Goals PT Goal Formulation: With patient Time For Goal Achievement: 03/05/23 Potential to Achieve Goals: Good    Frequency 7X/week     Co-evaluation               AM-PAC PT "6 Clicks" Mobility  Outcome Measure Help needed turning from your back to your side while in a flat bed without using bedrails?: A Little Help needed moving from lying on your back to sitting on the side of a flat bed without using bedrails?: A Little Help needed moving to and from a bed to a chair (including a wheelchair)?: A Little Help needed standing up from a chair using your arms (e.g., wheelchair or bedside chair)?: A Little Help needed to walk in hospital room?: A Little Help needed climbing 3-5 steps with a railing? : A Lot 6 Click Score: 17    End of Session Equipment Utilized During Treatment: Gait belt;Left knee immobilizer Activity Tolerance: Patient tolerated treatment well Patient left: in chair;with call bell/phone within reach;with chair alarm set Nurse Communication: Mobility status PT Visit Diagnosis: Difficulty in walking, not elsewhere classified (R26.2)    Time: [id number] PT Time Calculation (min) (ACUTE ONLY): 17 min   Charges:   PT Evaluation $PT  Eval Low Complexity: 1 Low         [name] PT, DPT Physical Therapist Acute Rehabilitation Services Office: [phone]   [name] [place] 02/26/2023, 4:14 PM

## 2023-02-26 NOTE — TOC Transition Note (Signed)
Transition of Care [facility]) - CM/SW Discharge Note   Patient Details  Name: [name] MRN: [id number] Date of Birth: 03-20-1961  [facility] of [facility] CM/SW Contact:  Lennart Pall, LCSW Phone Number: 02/26/2023, 1:01 PM   Clinical Narrative:     Met with pt and confirming she has needed DME at home.  HHPT prearranged with [facility] via MD office.  No further TOC needs.  Final next level of care: [place] Barriers to Discharge: No Barriers Identified   Patient Goals and CMS Choice      Discharge Placement                         Discharge Plan and Services Additional resources added to the After Visit Summary for                  DME Arranged: N/A DME Agency: NA       HH Arranged: PT [place] Agency: [place]        Social Determinants of Health (SDOH) Interventions SDOH Screenings   Depression (PHQ2-9): Low Risk  (11/12/2022)  Tobacco Use: Low Risk  (02/26/2023)     Readmission Risk Interventions     No data to display

## 2023-02-27 DIAGNOSIS — M1712 Unilateral primary osteoarthritis, left knee: Secondary | ICD-10-CM | POA: Diagnosis not present

## 2023-02-27 LAB — BASIC METABOLIC PANEL
Anion gap: 8 (ref 5–15)
BUN: 14 mg/dL (ref 8–23)
CO2: 25 mmol/L (ref 22–32)
Calcium: 8.9 mg/dL (ref 8.9–10.3)
Chloride: 104 mmol/L (ref 98–111)
Creatinine, Ser: 0.68 mg/dL (ref 0.44–1.00)
GFR, Estimated: >60 mL/min (ref >60)
Glucose, Bld: 168 mg/dL — ABNORMAL HIGH (ref 70–99)
Potassium: 4.2 mmol/L (ref 3.5–5.1)
Sodium: 137 mmol/L (ref 135–145)

## 2023-02-27 LAB — CBC
HCT: 38.8 % (ref 36.0–46.0)
Hemoglobin: 12.1 g/dL (ref 12.0–15.0)
MCH: 29.1 pg (ref 26.0–34.0)
MCHC: 31.2 g/dL (ref 30.0–36.0)
MCV: 93.3 fL (ref 80.0–100.0)
Platelets: 294 10*3/uL (ref 150–400)
RBC: 4.16 MIL/uL (ref 3.87–5.11)
RDW: 11.9 % (ref 11.5–15.5)
WBC: 13.1 10*3/uL — ABNORMAL HIGH (ref 4.0–10.5)
nRBC: 0 % (ref 0.0–0.2)

## 2023-02-27 LAB — GLUCOSE, CAPILLARY
Glucose-Capillary: 116 mg/dL — ABNORMAL HIGH (ref 70–99)
Glucose-Capillary: 177 mg/dL — ABNORMAL HIGH (ref 70–99)
Glucose-Capillary: 146 mg/dL — ABNORMAL HIGH (ref 70–99)
Glucose-Capillary: 136 mg/dL — ABNORMAL HIGH (ref 70–99)

## 2023-02-27 MED ORDER — TIZANIDINE HCL 4 MG PO TABS: 4.0000 mg | ORAL_TABLET | Freq: Four times a day (QID) | ORAL | 0 refills | Status: DC | PRN

## 2023-02-27 MED ORDER — HYDROMORPHONE HCL 4 MG PO TABS: 4.0000 mg | ORAL_TABLET | Freq: Four times a day (QID) | ORAL | 0 refills | Status: DC | PRN

## 2023-02-27 MED ORDER — ASPIRIN 81 MG PO CHEW: 81.0000 mg | CHEWABLE_TABLET | Freq: Two times a day (BID) | ORAL | 0 refills | Status: DC

## 2023-02-27 NOTE — Discharge Summary (Signed)
Patient ID: [name] MRN: [id number] DOB/AGE: August 06, 1961 63 y.o.  Admit date: 02/26/2023 Discharge date: 02/27/2023  Admission Diagnoses:  Principal Problem:   Unilateral primary osteoarthritis, left knee Active Problems:   Status post left knee replacement   Discharge Diagnoses:  Same  Past Medical History:  Diagnosis Date   Arthritis    Campylobacter enteritis May 2012   [facility] admission   Carpal tunnel syndrome, bilateral    Diabetes mellitus    Gastritis with bleeding due to alcohol 2010   [facility] admission   GERD (gastroesophageal reflux disease)    Hyperlipidemia    Hypertension    no meds   Menopause    Myocardial infarction [facility])    Neck pain    Wears glasses     Surgeries: Procedure(s): LEFT TOTAL KNEE ARTHROPLASTY on 02/26/2023   Consultants:   Discharged Condition: Improved  Hospital Course: [name] is an 62 y.o. female who was admitted 02/26/2023 for operative treatment ofUnilateral primary osteoarthritis, left knee. Patient has severe unremitting pain that affects sleep, daily activities, and work/hobbies. After pre-op clearance the patient was taken to the operating room on 02/26/2023 and underwent  Procedure(s): LEFT TOTAL KNEE ARTHROPLASTY.    Patient was given perioperative antibiotics:  Anti-infectives (From admission, onward)    Start     Dose/Rate Route Frequency Ordered Stop   02/26/23 1500  ceFAZolin (ANCEF) IVPB 1 g/50 mL premix        1 g 100 mL/hr over 30 Minutes Intravenous Every 6 hours 02/26/23 1151 02/26/23 2152   02/26/23 0630  ceFAZolin (ANCEF) IVPB 2g/100 mL premix        2 g 200 mL/hr over 30 Minutes Intravenous On call to O.R. 02/26/23 0615 02/26/23 0902        Patient was given sequential compression devices, early ambulation, and chemoprophylaxis to prevent DVT.  Patient benefited maximally from hospital stay and there were no complications.    Recent vital signs: Patient Vitals for the past 24 hrs:  BP Temp Temp  src Pulse Resp SpO2 Height Weight  02/27/23 1035 136/81 (!) 97.5 F (36.4 C) Oral 80 18 100 % -- --  02/27/23 0534 (!) 132/91 98.5 F (36.9 C) Oral 85 16 96 % -- --  02/27/23 0156 127/80 98.1 F (36.7 C) Oral 86 16 99 % -- --  02/26/23 2037 (!) 146/85 98 F (36.7 C) Oral 79 18 100 % -- --  02/26/23 1700 138/83 -- -- 86 16 97 % 5\' 3"  (1.6 m) 79.3 kg  02/26/23 1442 137/83 98.2 F (36.8 C) Oral 93 16 98 % -- --     Recent laboratory studies:  Recent Labs    02/27/23 0340  WBC 13.1*  HGB 12.1  HCT 38.8  PLT 294  NA 137  K 4.2  CL 104  CO2 25  BUN 14  CREATININE 0.68  GLUCOSE 168*  CALCIUM 8.9     Discharge Medications:   Allergies as of 02/27/2023       Reactions   Oxycodone-acetaminophen Other (See Comments)   Causes her to feel "hot".   Penicillins Rash   Did it involve swelling of the face/tongue/throat, SOB, or low BP? No Did it involve sudden or severe rash/hives, skin peeling, or any reaction on the inside of your mouth or nose? No Did you need to seek medical attention at a hospital or doctor's office? No When did it last happen?       If all above  answers are "NO", may proceed with cephalosporin use.        Medication List     STOP taking these medications    methocarbamol 500 MG tablet Commonly known as: ROBAXIN   oxyCODONE-acetaminophen 7.5-325 MG tablet Commonly known as: PERCOCET       TAKE these medications    aspirin 81 MG chewable tablet Chew 1 tablet (81 mg total) by mouth daily. What changed: Another medication with the same name was added. Make sure you understand how and when to take each.   aspirin 81 MG chewable tablet Chew 1 tablet (81 mg total) by mouth 2 (two) times daily. What changed: You were already taking a medication with the same name, and this prescription was added. Make sure you understand how and when to take each.   atorvastatin 40 MG tablet Commonly known as: LIPITOR Take 1 tablet (40 mg total) by mouth  daily.   cyanocobalamin 1000 MCG tablet Commonly known as: VITAMIN B12 Take 1,000 mcg by mouth daily.   Easy Touch Lancing Device Misc Use to check diabetes 4 times daily  E 11.65   Farxiga 10 MG Tabs tablet Generic drug: dapagliflozin propanediol Take 10 mg by mouth daily.   Fiasp FlexTouch 100 UNIT/ML FlexTouch Pen Generic drug: insulin aspart Inject 6-20 Units into the skin 3 (three) times daily before meals.   FreeStyle Libre 3 Sensor Misc 1 each by Does not apply route every 14 (fourteen) days.   gabapentin 600 MG tablet Commonly known as: NEURONTIN Take 600 mg by mouth 3 (three) times daily.   glucose blood test strip Commonly known as: Estate manager/land agent TEST BLOOD SUGARS 3 TIMES DAILY   HYDROmorphone 4 MG tablet Commonly known as: Dilaudid Take 1 tablet (4 mg total) by mouth every 6 (six) hours as needed for severe pain.   hydrOXYzine 25 MG tablet Commonly known as: ATARAX Take 25 mg by mouth 3 (three) times daily as needed. itching   Insulin Pen Needle 32G X 4 MM Misc Use 4x a day   latanoprost 0.005 % ophthalmic solution Commonly known as: XALATAN Place 1 drop into both eyes at bedtime.   lidocaine 5 % ointment Commonly known as: XYLOCAINE Apply 1 application. topically as needed.   metFORMIN 1000 MG tablet Commonly known as: GLUCOPHAGE Take 1 tablet (1,000 mg total) by mouth daily with supper.   metoprolol succinate 50 MG 24 hr tablet Commonly known as: TOPROL-XL TAKE 1 TABLET BY MOUTH ONCE DAILY. TAKE  WITH  OR  IMMEDIATELY  FOLLOWING  A  MEAL   naloxone 4 MG/0.1ML Liqd nasal spray kit Commonly known as: NARCAN Place 1 spray into the nose as needed (or).   Ozempic (2 MG/DOSE) 8 MG/3ML Sopn Generic drug: Semaglutide (2 MG/DOSE) Inject 2 mg into the skin once a week. Saturday   pantoprazole 40 MG tablet Commonly known as: PROTONIX Take 1 tablet (40 mg total) by mouth 2 (two) times daily.   solifenacin 5 MG tablet Commonly known as:  VESICARE Take 5 mg by mouth daily.   tiZANidine 4 MG tablet Commonly known as: Zanaflex Take 1 tablet (4 mg total) by mouth every 6 (six) hours as needed for muscle spasms.   Tyler Aas FlexTouch 200 UNIT/ML FlexTouch Pen Generic drug: insulin degludec INJECT 30 TO 34 UNITS SUBCUTANEOUSLY ONCE DAILY What changed: See the new instructions.   triamcinolone cream 0.1 % Commonly known as: KENALOG Apply 1 Application topically 3 (three) times daily as needed (itching).  VITAMIN C PO Take 1 tablet by mouth daily.               Durable Medical Equipment  (From admission, onward)           Start     Ordered   02/26/23 1152  DME 3 n 1  Once        02/26/23 1151   02/26/23 1152  DME Walker rolling  Once       Question Answer Comment  Walker: With 5 Inch Wheels   Patient needs a walker to treat with the following condition Status post total left knee replacement      02/26/23 1151            Diagnostic Studies: DG Knee Left Port  Result Date: 02/26/2023 CLINICAL DATA:  Left total knee replacement EXAM: PORTABLE LEFT KNEE - 2 VIEW COMPARISON:  None Available.  09/09/2022 FINDINGS: Status post left total knee replacement. No periprosthetic lucency or fracture. Near anatomic alignment of the prosthetic components. Air within the soft tissues is not unexpected postoperatively. Superficial skin staples. IMPRESSION: Expected postoperative appearance, status post left total knee replacement. Electronically Signed   By: Merilyn Baba M.D.   On: 02/26/2023 11:19    Disposition: Discharge disposition: 01-Home or [place], [place] Follow up.   Specialty: [place] Why: to provide home physical therapy visits Contact information: [address] [place] [address] [phone]         Mcarthur Rossetti, MD Follow up in 2 week(s).   Specialty: Orthopedic Surgery Contact information: [address] [place] [address] [phone]                  Signed: Mcarthur Rossetti 02/27/2023, 12:25 PM

## 2023-02-27 NOTE — Progress Notes (Signed)
Physical Therapy Treatment Patient Details Name: [name] MRN: [id number] DOB: 08-21-1961 Today's Date: 02/27/2023   History of Present Illness Pt is a 62 year old female s/p Left TKA on 02/26/23.    PT Comments    Pt reporting increased pain this afternoon.  RN states she is not yet due for any meds.  Pt ambulated short distance to stairs and practiced safe stair technique however reporting pain and dizziness thereafter.  Pt assisted safely to recliner and vitals obtained:  02/27/23 1425  Vital Signs  Pulse Rate 86  Pulse Rate Source Monitor  BP (!) 150/79  BP Location Left Arm  BP Method Automatic  Patient Position (if appropriate) Sitting  Oxygen Therapy  SpO2 99 %  O2 Device Room Air  RN present for stairs and aware.  Ice machine reapplied to knee.    Recommendations for follow up therapy are one component of a multi-disciplinary discharge planning process, led by the attending physician.  Recommendations may be updated based on patient status, additional functional criteria and insurance authorization.  Follow Up Recommendations       Assistance Recommended at Discharge PRN  Patient can return home with the following A little help with bathing/dressing/bathroom;A little help with walking and/or transfers;Help with stairs or ramp for entrance   Equipment Recommendations  None recommended by PT    Recommendations for Other Services       Precautions / Restrictions Precautions Precautions: Fall;Knee Required Braces or Orthoses: Knee Immobilizer - Left Restrictions LLE Weight Bearing: Weight bearing as tolerated     Mobility  Bed Mobility Overal bed mobility: Needs Assistance Bed Mobility: Supine to Sit     Supine to sit: Min guard     General bed mobility comments: cues for self assist    Transfers Overall transfer level: Needs assistance Equipment used: Rolling walker (2 wheels) Transfers: Sit to/from Stand Sit to Stand: Min guard, Min assist            General transfer comment: verbal cues for UE and LE positioning; assist for controlled descent as pt reports pain and dizziness    Ambulation/Gait Ambulation/Gait assistance: Min guard Gait Distance (Feet): 15 Feet Assistive device: Rolling walker (2 wheels) Gait Pattern/deviations: Step-to pattern, Antalgic, Decreased stance time - left Gait velocity: decr     General Gait Details: verbal cues for sequence, RW positioning, step length, distance limited by pain; ambulated to stairs and required recliner to return to room   Stairs Stairs: Yes Stairs assistance: Min guard, Min assist Stair Management: Step to pattern, Backwards, With walker Number of Stairs: 2 General stair comments: verbal cues for sequence, safety, RW positioning; pt has 2 rails but unable to reach both, performed with RW next to rail so pt could hold rail for more support; increased pain and dizziness upon completing; pt assisted to recliner and vitals obtained   Wheelchair Mobility    Modified Rankin (Stroke Patients Only)       Balance                                            Cognition Arousal/Alertness: Awake/alert Behavior During Therapy: WFL for tasks assessed/performed Overall Cognitive Status: Within Functional Limits for tasks assessed  Exercises     General Comments        Pertinent Vitals/Pain Pain Assessment Pain Assessment: 0-10 Pain Score: 10-Worst pain ever Pain Location: left knee Pain Descriptors / Indicators: Sore, Aching, Grimacing, Guarding, Burning Pain Intervention(s): Repositioned, Monitored during session, Patient requesting pain meds-RN notified, Ice applied    Home Living                          Prior Function            PT Goals (current goals can now be found in the care plan section) Progress towards PT goals: Progressing toward goals    Frequency     7X/week      PT Plan Current plan remains appropriate    Co-evaluation              AM-PAC PT "6 Clicks" Mobility   Outcome Measure  Help needed turning from your back to your side while in a flat bed without using bedrails?: A Little Help needed moving from lying on your back to sitting on the side of a flat bed without using bedrails?: A Little Help needed moving to and from a bed to a chair (including a wheelchair)?: A Little Help needed standing up from a chair using your arms (e.g., wheelchair or bedside chair)?: A Little Help needed to walk in hospital room?: A Little Help needed climbing 3-5 steps with a railing? : A Lot 6 Click Score: 17    End of Session Equipment Utilized During Treatment: Gait belt;Left knee immobilizer Activity Tolerance: Patient limited by pain Patient left: in chair;with call bell/phone within reach;with chair alarm set Nurse Communication: Mobility status PT Visit Diagnosis: Difficulty in walking, not elsewhere classified (R26.2)     Time: [id number] PT Time Calculation (min) (ACUTE ONLY): 19 min  Charges:  $Gait Training: 8-22 mins                    Arlyce Dice, DPT Physical Therapist Acute Rehabilitation Services Office: [place] 02/27/2023, 2:54 PM

## 2023-02-27 NOTE — Discharge Instructions (Signed)

## 2023-02-27 NOTE — Progress Notes (Signed)
Physical Therapy Treatment Patient Details Name: [name] MRN: [id number] DOB: [birth year] Today's Date: 02/27/2023   History of Present Illness Pt is a 62 year old female s/p Left TKA on 02/26/23.    PT Comments    Pt performed LE exercises.  Pt reports more pain today and limited ambulation due to pain.  Pt will need to practice stairs prior to d/c.   Recommendations for follow up therapy are one component of a multi-disciplinary discharge planning process, led by the attending physician.  Recommendations may be updated based on patient status, additional functional criteria and insurance authorization.  Follow Up Recommendations       Assistance Recommended at Discharge PRN  Patient can return home with the following A little help with bathing/dressing/bathroom;A little help with walking and/or transfers;Help with stairs or ramp for entrance   Equipment Recommendations  None recommended by PT    Recommendations for Other Services       Precautions / Restrictions Precautions Precautions: Fall;Knee Required Braces or Orthoses: Knee Immobilizer - Left Restrictions LLE Weight Bearing: Weight bearing as tolerated     Mobility  Bed Mobility Overal bed mobility: Needs Assistance Bed Mobility: Supine to Sit     Supine to sit: Min guard     General bed mobility comments: cues for self assist    Transfers Overall transfer level: Needs assistance Equipment used: Rolling walker (2 wheels) Transfers: Sit to/from Stand Sit to Stand: Min guard           General transfer comment: verbal cues for UE and LE positioning    Ambulation/Gait Ambulation/Gait assistance: Min guard Gait Distance (Feet): 25 Feet Assistive device: Rolling walker (2 wheels) Gait Pattern/deviations: Step-to pattern, Antalgic, Decreased stance time - left Gait velocity: decr     General Gait Details: verbal cues for sequence, RW positioning, step length, distance limited by  pain   Stairs             Wheelchair Mobility    Modified Rankin (Stroke Patients Only)       Balance                                            Cognition Arousal/Alertness: Awake/alert Behavior During Therapy: WFL for tasks assessed/performed Overall Cognitive Status: Within Functional Limits for tasks assessed                                          Exercises Total Joint Exercises Ankle Circles/Pumps: AROM, Both, 10 reps Quad Sets: AROM, Left, 10 reps Heel Slides: AAROM, Left, 10 reps Hip ABduction/ADduction: AAROM, Left, 10 reps Straight Leg Raises: AAROM, Left, 10 reps    General Comments        Pertinent Vitals/Pain Pain Assessment Pain Assessment: 0-10 Pain Score: 8  Pain Location: left knee Pain Descriptors / Indicators: Sore, Aching, Grimacing, Guarding Pain Intervention(s): Repositioned, Monitored during session, Ice applied    Home Living                          Prior Function            PT Goals (current goals can now be found in the care plan section) Progress towards PT goals: Progressing toward goals  Frequency    7X/week      PT Plan Current plan remains appropriate    Co-evaluation              AM-PAC PT "6 Clicks" Mobility   Outcome Measure  Help needed turning from your back to your side while in a flat bed without using bedrails?: A Little Help needed moving from lying on your back to sitting on the side of a flat bed without using bedrails?: A Little Help needed moving to and from a bed to a chair (including a wheelchair)?: A Little Help needed standing up from a chair using your arms (e.g., wheelchair or bedside chair)?: A Little Help needed to walk in hospital room?: A Little Help needed climbing 3-5 steps with a railing? : A Lot 6 Click Score: 17    End of Session Equipment Utilized During Treatment: Gait belt;Left knee immobilizer Activity Tolerance:  Patient tolerated treatment well;Patient limited by pain Patient left: in chair;with call bell/phone within reach;with chair alarm set Nurse Communication: Mobility status PT Visit Diagnosis: Difficulty in walking, not elsewhere classified (R26.2)     Time: [id number] PT Time Calculation (min) (ACUTE ONLY): 20 min  Charges:  $Therapeutic Exercise: 8-22 mins                     Arlyce Dice, DPT Physical Therapist Acute Rehabilitation Services Office: [phone]    Kati L Payson 02/27/2023, 2:02 PM

## 2023-02-27 NOTE — Progress Notes (Signed)
Subjective: 1 Day Post-Op Procedure(s) (LRB): LEFT TOTAL KNEE ARTHROPLASTY (Left) Patient reports pain as moderate.    Objective: Vital signs in last 24 hours: Temp:  [97.5 F (36.4 C)-98.5 F (36.9 C)] 97.5 F (36.4 C) (03/30 1035) Pulse Rate:  [79-93] 80 (03/30 1035) Resp:  [16-18] 18 (03/30 1035) BP: (127-146)/(80-91) 136/81 (03/30 1035) SpO2:  [96 %-100 %] 100 % (03/30 1035) Weight:  [79.3 kg] 79.3 kg (03/29 1700)  Intake/Output from previous day: 03/29 0701 - 03/30 0700 In: 3714.3 [P.O.:840; I.V.:2674.3; IV Piggyback:200] Out: [id number] [Urine:4150; Blood:30] Intake/Output this shift: Total I/O In: 484.5 [P.O.:240; I.V.:244.5] Out: -   Recent Labs    02/27/23 0340  HGB 12.1   Recent Labs    02/27/23 0340  WBC 13.1*  RBC 4.16  HCT 38.8  PLT 294   Recent Labs    02/27/23 0340  NA 137  K 4.2  CL 104  CO2 25  BUN 14  CREATININE 0.68  GLUCOSE 168*  CALCIUM 8.9   No results for input(s): "LABPT", "INR" in the last 72 hours.  Sensation intact distally Intact pulses distally Dorsiflexion/Plantar flexion intact Incision: dressing C/D/I No cellulitis present Compartment soft   Assessment/Plan: 1 Day Post-Op Procedure(s) (LRB): LEFT TOTAL KNEE ARTHROPLASTY (Left) Up with therapy Discharge home with home health this afternoon.      Mcarthur Rossetti 02/27/2023, 12:20 PM

## 2023-02-27 NOTE — Plan of Care (Signed)

## 2023-02-28 DIAGNOSIS — M1712 Unilateral primary osteoarthritis, left knee: Secondary | ICD-10-CM | POA: Diagnosis not present

## 2023-02-28 LAB — GLUCOSE, CAPILLARY
Glucose-Capillary: 143 mg/dL — ABNORMAL HIGH (ref 70–99)
Glucose-Capillary: 128 mg/dL — ABNORMAL HIGH (ref 70–99)
Glucose-Capillary: 137 mg/dL — ABNORMAL HIGH (ref 70–99)
Glucose-Capillary: 171 mg/dL — ABNORMAL HIGH (ref 70–99)

## 2023-02-28 MED ORDER — CELECOXIB 100 MG PO CAPS
100.0000 mg | ORAL_CAPSULE | Freq: Two times a day (BID) | ORAL | Status: DC
  Administered 2023-02-28 – 2023-03-01 (×3): 100 mg via ORAL
  Filled 2023-02-28 (×3): qty 1

## 2023-02-28 NOTE — Progress Notes (Signed)
Physical Therapy Treatment Patient Details Name: [name] STICK MRN: [id number] DOB: [birth year] Today's Date: 02/28/2023   History of Present Illness Pt is a 62 year old female s/p Left TKA on 02/26/23.    PT Comments    Pt requiring increased time for mobility due to pain.  Pt only able to tolerate 36 feet of ambulation reporting 10/10 left knee pain.    Recommendations for follow up therapy are one component of a multi-disciplinary discharge planning process, led by the attending physician.  Recommendations may be updated based on patient status, additional functional criteria and insurance authorization.  Follow Up Recommendations       Assistance Recommended at Discharge PRN  Patient can return home with the following A little help with bathing/dressing/bathroom;A little help with walking and/or transfers;Help with stairs or ramp for entrance   Equipment Recommendations  None recommended by PT    Recommendations for Other Services       Precautions / Restrictions Precautions Precautions: Fall;Knee Required Braces or Orthoses: Knee Immobilizer - Left Restrictions LLE Weight Bearing: Weight bearing as tolerated     Mobility  Bed Mobility Overal bed mobility: Needs Assistance Bed Mobility: Supine to Sit, Sit to Supine     Supine to sit: Min guard Sit to supine: Min guard   General bed mobility comments: cues for self assist, increased time and effort    Transfers Overall transfer level: Needs assistance Equipment used: Rolling walker (2 wheels) Transfers: Sit to/from Stand Sit to Stand: Min guard           General transfer comment: verbal cues for UE and LE positioning    Ambulation/Gait Ambulation/Gait assistance: Min guard Gait Distance (Feet): 36 Feet Assistive device: Rolling walker (2 wheels) Gait Pattern/deviations: Step-to pattern, Antalgic, Decreased stance time - left Gait velocity: decr     General Gait Details: verbal cues for sequence,  RW positioning, step length, very slow pace today, distance limited by pain   Stairs             Wheelchair Mobility    Modified Rankin (Stroke Patients Only)       Balance                                            Cognition Arousal/Alertness: Awake/alert Behavior During Therapy: WFL for tasks assessed/performed Overall Cognitive Status: Within Functional Limits for tasks assessed                                          Exercises      General Comments        Pertinent Vitals/Pain Pain Assessment Pain Assessment: 0-10 Pain Score: 10-Worst pain ever Pain Location: left knee Pain Descriptors / Indicators: Sore, Aching, Grimacing, Guarding, Burning Pain Intervention(s): Repositioned, Monitored during session, Ice applied, Premedicated before session    Home Living                          Prior Function            PT Goals (current goals can now be found in the care plan section) Progress towards PT goals: Progressing toward goals    Frequency    7X/week      PT Plan  Current plan remains appropriate    Co-evaluation              AM-PAC PT "6 Clicks" Mobility   Outcome Measure  Help needed turning from your back to your side while in a flat bed without using bedrails?: A Little Help needed moving from lying on your back to sitting on the side of a flat bed without using bedrails?: A Little Help needed moving to and from a bed to a chair (including a wheelchair)?: A Little Help needed standing up from a chair using your arms (e.g., wheelchair or bedside chair)?: A Little Help needed to walk in hospital room?: A Little Help needed climbing 3-5 steps with a railing? : A Lot 6 Click Score: 17    End of Session Equipment Utilized During Treatment: Gait belt;Left knee immobilizer Activity Tolerance: Patient limited by pain Patient left: with call bell/phone within reach;in bed;with bed alarm  set Nurse Communication: Mobility status PT Visit Diagnosis: Difficulty in walking, not elsewhere classified (R26.2)     Time: [id number] PT Time Calculation (min) (ACUTE ONLY): 22 min  Charges:  $Gait Training: 8-22 mins                    Arlyce Dice, DPT Physical Therapist Acute Rehabilitation Services Office: [place] L [place] 02/28/2023, 3:20 PM

## 2023-02-28 NOTE — Progress Notes (Signed)
Physical Therapy Treatment Patient Details Name: [name] MRN: [id number] DOB: Apr 19, 1961 Today's Date: 02/28/2023   History of Present Illness Pt is a 62 year old female s/p Left TKA on 02/26/23.    PT Comments    Pt assisted with ambulating in hallway and still requires increased time however able to improve distance this afternoon.  Pt encouraged to stay OOB in recliner (also reporting "reflux" and RN notified).  Pt performed exercises in recliner.  Pt anticipates d/c home tomorrow.  Will practice stairs again prior to d/c.    Recommendations for follow up therapy are one component of a multi-disciplinary discharge planning process, led by the attending physician.  Recommendations may be updated based on patient status, additional functional criteria and insurance authorization.  Follow Up Recommendations       Assistance Recommended at Discharge PRN  Patient can return home with the following A little help with bathing/dressing/bathroom;A little help with walking and/or transfers;Help with stairs or ramp for entrance   Equipment Recommendations  None recommended by PT    Recommendations for Other Services       Precautions / Restrictions Precautions Precautions: Fall;Knee Required Braces or Orthoses: Knee Immobilizer - Left Restrictions LLE Weight Bearing: Weight bearing as tolerated     Mobility  Bed Mobility Overal bed mobility: Needs Assistance Bed Mobility: Supine to Sit     Supine to sit: Min guard Sit to supine: Min guard   General bed mobility comments: cues for self assist, increased time and effort    Transfers Overall transfer level: Needs assistance Equipment used: Rolling walker (2 wheels) Transfers: Sit to/from Stand Sit to Stand: Min guard           General transfer comment: verbal cues for UE and LE positioning    Ambulation/Gait Ambulation/Gait assistance: Min guard Gait Distance (Feet): 100 Feet Assistive device: Rolling walker  (2 wheels) Gait Pattern/deviations: Step-to pattern, Antalgic, Decreased stance time - left Gait velocity: decr     General Gait Details: verbal cues for sequence, RW positioning, posture   Stairs             Wheelchair Mobility    Modified Rankin (Stroke Patients Only)       Balance                                            Cognition Arousal/Alertness: Awake/alert Behavior During Therapy: WFL for tasks assessed/performed Overall Cognitive Status: Within Functional Limits for tasks assessed                                          Exercises Total Joint Exercises Ankle Circles/Pumps: AROM, Both, 10 reps Quad Sets: AROM, Left, 10 reps Heel Slides: AAROM, Left, 10 reps Hip ABduction/ADduction: AAROM, Left, 10 reps Straight Leg Raises: AAROM, Left, 10 reps    General Comments        Pertinent Vitals/Pain Pain Assessment Pain Assessment: 0-10 Pain Score: 8  Pain Location: left knee Pain Descriptors / Indicators: Sore, Aching, Grimacing, Guarding, Burning Pain Intervention(s): Premedicated before session, Repositioned, Monitored during session    Home Living                          Prior Function  PT Goals (current goals can now be found in the care plan section) Progress towards PT goals: Progressing toward goals    Frequency    7X/week      PT Plan Current plan remains appropriate    Co-evaluation              AM-PAC PT "6 Clicks" Mobility   Outcome Measure  Help needed turning from your back to your side while in a flat bed without using bedrails?: A Little Help needed moving from lying on your back to sitting on the side of a flat bed without using bedrails?: A Little Help needed moving to and from a bed to a chair (including a wheelchair)?: A Little Help needed standing up from a chair using your arms (e.g., wheelchair or bedside chair)?: A Little Help needed to walk in  hospital room?: A Little Help needed climbing 3-5 steps with a railing? : A Lot 6 Click Score: 17    End of Session Equipment Utilized During Treatment: Gait belt;Left knee immobilizer Activity Tolerance: Patient tolerated treatment well;Patient limited by pain Patient left: in chair;with call bell/phone within reach;with chair alarm set Nurse Communication: Mobility status PT Visit Diagnosis: Difficulty in walking, not elsewhere classified (R26.2)     Time: [id number] PT Time Calculation (min) (ACUTE ONLY): 29 min  Charges:  $Gait Training: 8-22 mins $Therapeutic Exercise: 8-22 mins                     Arlyce Dice, DPT Physical Therapist Acute Rehabilitation Services Office: [phone]    [name] [place] 02/28/2023, 3:23 PM

## 2023-02-28 NOTE — Plan of Care (Signed)
  Problem: Education: Goal: Ability to describe self-care measures that may prevent or decrease complications (Diabetes Survival Skills Education) will improve Outcome: Progressing   Problem: Education: Goal: Knowledge of the prescribed therapeutic regimen will improve Outcome: Progressing   Problem: Activity: Goal: Ability to avoid complications of mobility impairment will improve Outcome: Progressing   Problem: Clinical Measurements: Goal: Postoperative complications will be avoided or minimized Outcome: Progressing   Problem: Pain Management: Goal: Pain level will decrease with appropriate interventions Outcome: Progressing

## 2023-02-28 NOTE — Progress Notes (Signed)
  Subjective: [name] is a 62 y.o. female s/p left TKA by Dr. Ninfa Linden.  They are POD 2.  Pt's pain is moderate to severe.  Pt denies any complain of chest pain, shortness of breath, abdominal pain, calf pain.  Patient denies any fevers or chills.  She was able to ambulate with physical therapy yesterday but had significant increase in pain prompting cancellation of discharge.  She lives at home with her daughter who will help care for her and has 5 steps to get into her house.  Feels pain is increased today compared with yesterday.  Objective: Vital signs in last 24 hours: Temp:  [97.5 F (36.4 C)-99.3 F (37.4 C)] 98.9 F (37.2 C) (03/31 0544) Pulse Rate:  [80-100] 100 (03/31 0544) Resp:  [17-20] 20 (03/31 0544) BP: (127-150)/(74-87) 150/87 (03/31 0544) SpO2:  [91 %-100 %] 95 % (03/31 0544)  Intake/Output from previous day: 03/30 0701 - 03/31 0700 In: 964.5 [P.O.:720; I.V.:244.5] Out: -  Intake/Output this shift: No intake/output data recorded.  Exam:  No gross blood or drainage overlying the dressing 2+ DP pulse Sensation intact distally in the operative foot Able to dorsiflex and plantarflex the operative foot No calf tenderness.  Negative Homans' sign. unable to perform straight leg raise   Labs: Recent Labs    02/27/23 0340  HGB 12.1   Recent Labs    02/27/23 0340  WBC 13.1*  RBC 4.16  HCT 38.8  PLT 294   Recent Labs    02/27/23 0340  NA 137  K 4.2  CL 104  CO2 25  BUN 14  CREATININE 0.68  GLUCOSE 168*  CALCIUM 8.9   No results for input(s): "LABPT", "INR" in the last 72 hours.  Assessment/Plan: Pt is POD 2 s/p TKA.    -Plan to discharge to home in coming days pending patient's pain and PT eval.  Will see how therapy goes today and hopefully she will turn a corner later today in regards to her pain control.  -Do not anticipate discharge home today.  She seems to be in a lot of pain.  We will start Celebrex 100 mg twice daily to see if this  will help augment the rest of her pain management regimen.  She does have a history of taking oxycodone 7.5 mg 3 times daily chronically and is likely having increased pain after surgery due to her history of opioid use.  -WBAT with a walker  -Follow-up with Dr. Ninfa Linden in clinic 2 weeks postoperatively    [facility] 02/28/2023, 8:08 AM

## 2023-03-01 ENCOUNTER — Encounter (HOSPITAL_COMMUNITY): Payer: Self-pay | Admitting: Orthopaedic Surgery

## 2023-03-01 DIAGNOSIS — M1712 Unilateral primary osteoarthritis, left knee: Secondary | ICD-10-CM | POA: Diagnosis not present

## 2023-03-01 LAB — GLUCOSE, CAPILLARY: Glucose-Capillary: 129 mg/dL — ABNORMAL HIGH (ref 70–99)

## 2023-03-01 MED ORDER — ATORVASTATIN CALCIUM 40 MG PO TABS: 40.0000 mg | ORAL_TABLET | Freq: Every day | ORAL | 3 refills | Status: DC

## 2023-03-01 NOTE — Plan of Care (Signed)

## 2023-03-01 NOTE — Discharge Summary (Signed)
Patient ID: [name] MRN: [id number] DOB/AGE: 1961/08/05 62 y.o.  Admit date: 02/26/2023 Discharge date: 03/01/2023  Admission Diagnoses:  Principal Problem:   Unilateral primary osteoarthritis, left knee Active Problems:   Status post left knee replacement   Discharge Diagnoses:  Same  Past Medical History:  Diagnosis Date   Arthritis    Campylobacter enteritis May 2012   [facility] admission   Carpal tunnel syndrome, bilateral    Diabetes mellitus    Gastritis with bleeding due to alcohol 2010   [facility] admission   GERD (gastroesophageal reflux disease)    Hyperlipidemia    Hypertension    no meds   Menopause    Myocardial infarction    Neck pain    Wears glasses     Surgeries: Procedure(s): LEFT TOTAL KNEE ARTHROPLASTY on 02/26/2023   Consultants:   Discharged Condition: Improved  Hospital Course: [name] is an 62 y.o. female who was admitted 02/26/2023 for operative treatment ofUnilateral primary osteoarthritis, left knee. Patient has severe unremitting pain that affects sleep, daily activities, and work/hobbies. After pre-op clearance the patient was taken to the operating room on 02/26/2023 and underwent  Procedure(s): LEFT TOTAL KNEE ARTHROPLASTY.    Patient was given perioperative antibiotics:  Anti-infectives (From admission, onward)    Start     Dose/Rate Route Frequency Ordered Stop   02/26/23 1500  ceFAZolin (ANCEF) IVPB 1 g/50 mL premix        1 g 100 mL/hr over 30 Minutes Intravenous Every 6 hours 02/26/23 1151 02/26/23 2152   02/26/23 0630  ceFAZolin (ANCEF) IVPB 2g/100 mL premix        2 g 200 mL/hr over 30 Minutes Intravenous On call to O.R. 02/26/23 0615 02/26/23 0902        Patient was given sequential compression devices, early ambulation, and chemoprophylaxis to prevent DVT.  Patient benefited maximally from hospital stay and there were no complications.    Recent vital signs: Patient Vitals for the past 24 hrs:  BP Temp Temp src  Pulse Resp SpO2  03/01/23 0553 121/71 98 F (36.7 C) Oral 95 16 95 %  02/28/23 2044 131/83 98.4 F (36.9 C) Oral (!) 109 18 100 %  02/28/23 1351 121/81 98.6 F (37 C) Oral (!) 108 18 94 %     Recent laboratory studies:  Recent Labs    02/27/23 0340  WBC 13.1*  HGB 12.1  HCT 38.8  PLT 294  NA 137  K 4.2  CL 104  CO2 25  BUN 14  CREATININE 0.68  GLUCOSE 168*  CALCIUM 8.9     Discharge Medications:   Allergies as of 03/01/2023       Reactions   Oxycodone-acetaminophen Other (See Comments)   Causes her to feel "hot".   Penicillins Rash   Did it involve swelling of the face/tongue/throat, SOB, or low BP? No Did it involve sudden or severe rash/hives, skin peeling, or any reaction on the inside of your mouth or nose? No Did you need to seek medical attention at a hospital or doctor's office? No When did it last happen?       If all above answers are "NO", may proceed with cephalosporin use.        Medication List     STOP taking these medications    methocarbamol 500 MG tablet Commonly known as: ROBAXIN   oxyCODONE-acetaminophen 7.5-325 MG tablet Commonly known as: PERCOCET       TAKE these medications  aspirin 81 MG chewable tablet Chew 1 tablet (81 mg total) by mouth 2 (two) times daily. What changed: when to take this   atorvastatin 40 MG tablet Commonly known as: LIPITOR Take 1 tablet (40 mg total) by mouth daily.   cyanocobalamin 1000 MCG tablet Commonly known as: VITAMIN B12 Take 1,000 mcg by mouth daily.   Easy Touch Lancing Device Misc Use to check diabetes 4 times daily  E 11.65   Farxiga 10 MG Tabs tablet Generic drug: dapagliflozin propanediol Take 10 mg by mouth daily.   Fiasp FlexTouch 100 UNIT/ML FlexTouch Pen Generic drug: insulin aspart Inject 6-20 Units into the skin 3 (three) times daily before meals.   FreeStyle Libre 3 Sensor Misc 1 each by Does not apply route every 14 (fourteen) days.   gabapentin 600 MG  tablet Commonly known as: NEURONTIN Take 600 mg by mouth 3 (three) times daily.   glucose blood test strip Commonly known as: Estate manager/land agent TEST BLOOD SUGARS 3 TIMES DAILY   HYDROmorphone 4 MG tablet Commonly known as: Dilaudid Take 1 tablet (4 mg total) by mouth every 6 (six) hours as needed for severe pain.   hydrOXYzine 25 MG tablet Commonly known as: ATARAX Take 25 mg by mouth 3 (three) times daily as needed. itching   Insulin Pen Needle 32G X 4 MM Misc Use 4x a day   latanoprost 0.005 % ophthalmic solution Commonly known as: XALATAN Place 1 drop into both eyes at bedtime.   lidocaine 5 % ointment Commonly known as: XYLOCAINE Apply 1 application. topically as needed.   metFORMIN 1000 MG tablet Commonly known as: GLUCOPHAGE Take 1 tablet (1,000 mg total) by mouth daily with supper.   metoprolol succinate 50 MG 24 hr tablet Commonly known as: TOPROL-XL TAKE 1 TABLET BY MOUTH ONCE DAILY. TAKE  WITH  OR  IMMEDIATELY  FOLLOWING  A  MEAL   naloxone 4 MG/0.1ML Liqd nasal spray kit Commonly known as: NARCAN Place 1 spray into the nose as needed (or).   Ozempic (2 MG/DOSE) 8 MG/3ML Sopn Generic drug: Semaglutide (2 MG/DOSE) Inject 2 mg into the skin once a week. Saturday   pantoprazole 40 MG tablet Commonly known as: PROTONIX Take 1 tablet (40 mg total) by mouth 2 (two) times daily.   solifenacin 5 MG tablet Commonly known as: VESICARE Take 5 mg by mouth daily.   tiZANidine 4 MG tablet Commonly known as: Zanaflex Take 1 tablet (4 mg total) by mouth every 6 (six) hours as needed for muscle spasms.   Tyler Aas FlexTouch 200 UNIT/ML FlexTouch Pen Generic drug: insulin degludec INJECT 30 TO 34 UNITS SUBCUTANEOUSLY ONCE DAILY What changed: See the new instructions.   triamcinolone cream 0.1 % Commonly known as: KENALOG Apply 1 Application topically 3 (three) times daily as needed (itching).   VITAMIN C PO Take 1 tablet by mouth daily.                Durable Medical Equipment  (From admission, onward)           Start     Ordered   02/26/23 1152  DME 3 n 1  Once        02/26/23 1151   02/26/23 1152  DME Walker rolling  Once       Question Answer Comment  Walker: With 5 Inch Wheels   Patient needs a walker to treat with the following condition Status post total left knee replacement      02/26/23  1151            Diagnostic Studies: DG Knee Left Port  Result Date: 02/26/2023 CLINICAL DATA:  Left total knee replacement EXAM: PORTABLE LEFT KNEE - 2 VIEW COMPARISON:  None Available.  09/09/2022 FINDINGS: Status post left total knee replacement. No periprosthetic lucency or fracture. Near anatomic alignment of the prosthetic components. Air within the soft tissues is not unexpected postoperatively. Superficial skin staples. IMPRESSION: Expected postoperative appearance, status post left total knee replacement. Electronically Signed   By: Merilyn Baba M.D.   On: 02/26/2023 11:19    Disposition: Discharge disposition: 01-Home or [place] Follow up.   Specialty: [place] Why: to provide home physical therapy visits Contact information: [address] [place] [address] [phone]         Mcarthur Rossetti, MD Follow up in 2 week(s).   Specialty: Orthopedic Surgery Contact information: [address] [place] [address] [phone]                  Signed: Mcarthur Rossetti 03/01/2023, 8:07 AM

## 2023-03-01 NOTE — Progress Notes (Signed)
Physical Therapy Treatment Patient Details Name: [name] MRN: [id number] DOB: [birth year] Today's Date: 03/01/2023   History of Present Illness Pt is a 62 year old female s/p Left TKA on 02/26/23.    PT Comments    Pt ambulated in hallway and practiced safe stair technique.  Pt provided with stair and HEP handouts.  Pt reports understanding and feels ready for d/c home today.    Recommendations for follow up therapy are one component of a multi-disciplinary discharge planning process, led by the attending physician.  Recommendations may be updated based on patient status, additional functional criteria and insurance authorization.  Follow Up Recommendations       Assistance Recommended at Discharge PRN  Patient can return home with the following A little help with bathing/dressing/bathroom;A little help with walking and/or transfers;Help with stairs or ramp for entrance   Equipment Recommendations  None recommended by PT    Recommendations for Other Services       Precautions / Restrictions Precautions Precautions: Fall;Knee Precaution Comments: performed mobility today without KI Restrictions LLE Weight Bearing: Weight bearing as tolerated     Mobility  Bed Mobility Overal bed mobility: Needs Assistance Bed Mobility: Supine to Sit     Supine to sit: Supervision     General bed mobility comments: cues for self assist, increased time and effort    Transfers Overall transfer level: Needs assistance Equipment used: Rolling walker (2 wheels) Transfers: Sit to/from Stand Sit to Stand: Min guard           General transfer comment: verbal cues for UE and LE positioning    Ambulation/Gait Ambulation/Gait assistance: Min guard Gait Distance (Feet): 120 Feet Assistive device: Rolling walker (2 wheels) Gait Pattern/deviations: Step-to pattern, Antalgic, Decreased stance time - left Gait velocity: decr     General Gait Details: verbal cues for sequence, RW  positioning, posture, did not use KI and no buckling observed; pt reports still painful but no different with or without brace   Stairs Stairs: Yes Stairs assistance: Min guard Stair Management: Step to pattern, Backwards, With walker Number of Stairs: 2 General stair comments: verbal cues for sequence, safety, RW positioning; pt performed twice and reports understanding; provided handout   Wheelchair Mobility    Modified Rankin (Stroke Patients Only)       Balance                                            Cognition Arousal/Alertness: Awake/alert Behavior During Therapy: WFL for tasks assessed/performed Overall Cognitive Status: Within Functional Limits for tasks assessed                                          Exercises      General Comments        Pertinent Vitals/Pain Pain Assessment Pain Assessment: 0-10 Pain Score: 7  Pain Location: left knee Pain Descriptors / Indicators: Sore, Aching, Grimacing, Guarding, Burning Pain Intervention(s): Monitored during session, Repositioned, Premedicated before session    Home Living                          Prior Function            PT Goals (current goals can now  be found in the care plan section) Progress towards PT goals: Progressing toward goals    Frequency    7X/week      PT Plan Current plan remains appropriate    Co-evaluation              AM-PAC PT "6 Clicks" Mobility   Outcome Measure  Help needed turning from your back to your side while in a flat bed without using bedrails?: A Little Help needed moving from lying on your back to sitting on the side of a flat bed without using bedrails?: A Little Help needed moving to and from a bed to a chair (including a wheelchair)?: A Little Help needed standing up from a chair using your arms (e.g., wheelchair or bedside chair)?: A Little Help needed to walk in hospital room?: A Little Help needed  climbing 3-5 steps with a railing? : A Little 6 Click Score: 18    End of Session Equipment Utilized During Treatment: Gait belt Activity Tolerance: Patient tolerated treatment well Patient left: in bed;with call bell/phone within reach Nurse Communication: Mobility status PT Visit Diagnosis: Difficulty in walking, not elsewhere classified (R26.2)     Time: [id number] PT Time Calculation (min) (ACUTE ONLY): 22 min  Charges:  $Gait Training: 8-22 mins                    Arlyce Dice, DPT Physical Therapist Acute Rehabilitation Services Office: [place] 03/01/2023, 4:25 PM

## 2023-03-01 NOTE — Progress Notes (Signed)
Subjective: 3 Days Post-Op Procedure(s) (LRB): LEFT TOTAL KNEE ARTHROPLASTY (Left) Patient reports pain as moderate.    Objective: Vital signs in last 24 hours: Temp:  [98 F (36.7 C)-98.6 F (37 C)] 98 F (36.7 C) (04/01 0553) Pulse Rate:  [95-109] 95 (04/01 0553) Resp:  [16-18] 16 (04/01 0553) BP: (121-131)/(71-83) 121/71 (04/01 0553) SpO2:  [94 %-100 %] 95 % (04/01 0553)  Intake/Output from previous day: 03/31 0701 - 04/01 0700 In: 720 [P.O.:720] Out: -  Intake/Output this shift: No intake/output data recorded.  Recent Labs    02/27/23 0340  HGB 12.1   Recent Labs    02/27/23 0340  WBC 13.1*  RBC 4.16  HCT 38.8  PLT 294   Recent Labs    02/27/23 0340  NA 137  K 4.2  CL 104  CO2 25  BUN 14  CREATININE 0.68  GLUCOSE 168*  CALCIUM 8.9   No results for input(s): "LABPT", "INR" in the last 72 hours.  Sensation intact distally Intact pulses distally Dorsiflexion/Plantar flexion intact Incision: dressing C/D/I Compartment soft   Assessment/Plan: 3 Days Post-Op Procedure(s) (LRB): LEFT TOTAL KNEE ARTHROPLASTY (Left) Up with therapy Discharge home with home health today.      Mcarthur Rossetti 03/01/2023, 7:36 AM

## 2023-03-02 ENCOUNTER — Telehealth: Payer: Self-pay | Admitting: Orthopaedic Surgery

## 2023-03-02 ENCOUNTER — Telehealth: Payer: Self-pay

## 2023-03-02 NOTE — Telephone Encounter (Signed)
I called and talked to the daughter. She is concerned with her mom getting the immobilizer because she stated her mom is already not getting up and moving. She is worried it will make her mom not want to get up at all. Do you still want me to order the immobilizer?

## 2023-03-02 NOTE — Telephone Encounter (Signed)
Patient daughter called in asking if the patients shower chair was ordered or if they need to Rx for it and if the Knee mobilizers was also ordered please advise Moy[place]wa, Angelastro (Daughter) [phone] [facility])

## 2023-03-02 NOTE — Transitions of Care (Post Inpatient/ED Visit) (Signed)
   03/02/2023  Name: [name] MRN: [id number] DOB: 1961/02/19  Today's TOC FU Call Status: Today's TOC FU Call Status:: Successful TOC FU Call Competed TOC FU Call Complete Date: 03/02/23  Transition Care Management Follow-up Telephone Call Date of Discharge: 03/01/23 Discharge Facility: [facility]) Type of Discharge: Inpatient Admission Primary Inpatient Discharge Diagnosis:: Unilateral primary osteoarthritis, left knee How have you been since you were released from the hospital?: Better Any questions or concerns?: No  Items Reviewed: Did you receive and understand the discharge instructions provided?: Yes Medications obtained and verified?: Yes (Medications Reviewed) Any new allergies since your discharge?: No Dietary orders reviewed?: NA Do you have support at home?: Yes  Home Care and Equipment/Supplies: [place] Ordered?: Yes Name of [place]:: [place] set up a time to come to your home?: Yes [place] Visit Date: 03/02/23 Any new equipment or medical supplies ordered?: Yes Name of Medical supply agency?: unknown Were you able to get the equipment/medical supplies?: Yes (walker) Do you have any questions related to the use of the equipment/supplies?: No  Functional Questionnaire: Do you need assistance with bathing/showering or dressing?: No Do you need assistance with meal preparation?: No Do you need assistance with eating?: No Do you have difficulty maintaining continence: No Do you need assistance with getting out of bed/getting out of a chair/moving?: No Do you have difficulty managing or taking your medications?: No  Follow up appointments reviewed: PCP Follow-up appointment confirmed?: No (pt declined- will follow up with Ortho) MD Provider Line Number:[phone] Given: Yes [facility] Follow-up appointment confirmed?: Yes Date of Specialist follow-up appointment?:  03/11/23 Follow-Up Specialty Provider:: Dr. Jean Rosenthal- Orthopedic Do you need transportation to your follow-up appointment?: No Do you understand care options if your condition(s) worsen?: Yes-patient verbalized understanding    Norton Blizzard, [place] ([place])  [place] [phone]

## 2023-03-02 NOTE — Telephone Encounter (Signed)
Shower chair ordered via parachute

## 2023-03-05 ENCOUNTER — Telehealth: Payer: Self-pay | Admitting: Orthopaedic Surgery

## 2023-03-05 ENCOUNTER — Other Ambulatory Visit: Payer: Self-pay | Admitting: Orthopaedic Surgery

## 2023-03-05 MED ORDER — HYDROMORPHONE HCL 4 MG PO TABS: 4.0000 mg | ORAL_TABLET | Freq: Four times a day (QID) | ORAL | 0 refills | Status: DC | PRN

## 2023-03-05 MED ORDER — TIZANIDINE HCL 4 MG PO TABS: 4.0000 mg | ORAL_TABLET | Freq: Four times a day (QID) | ORAL | 0 refills | Status: DC | PRN

## 2023-03-05 NOTE — Telephone Encounter (Signed)
Hui from [facility] called. She would like to reduce the dosage of the dilaudid. Her call back number is [phone]. Patient is waiting

## 2023-03-05 NOTE — Telephone Encounter (Signed)
Patient's daughter Rudell Cobb called advised patient fell this morning around 3:25 am. Rudell Cobb asked if patient can get a call to check on the patient. Rudell Cobb said she is at work right now and will try to get someone to bring her in if she needs to see the doctor. The number to contact Rudell Cobb is [phone]

## 2023-03-05 NOTE — Telephone Encounter (Signed)
I called and talked to the daughter and pt. They stated pt fell backwards. No direct impact to the knee. I advised to rest and ice. They stated understanding. Pt stated she is almost out of her muscle relaxer and pain medication. I refilled the muscle relaxer. Can you please send in the pain medication. Thank you

## 2023-03-08 ENCOUNTER — Telehealth: Payer: Self-pay | Admitting: Orthopaedic Surgery

## 2023-03-08 ENCOUNTER — Other Ambulatory Visit: Payer: Self-pay | Admitting: Orthopaedic Surgery

## 2023-03-08 MED ORDER — HYDROMORPHONE HCL 2 MG PO TABS: 2.0000 mg | ORAL_TABLET | Freq: Four times a day (QID) | ORAL | 0 refills | Status: DC | PRN

## 2023-03-08 NOTE — Telephone Encounter (Signed)
[facility] asking about medication needs to have dr. Magnus Ivan to call and revise medication due to high dose for patient --[phone] ( RY)

## 2023-03-09 ENCOUNTER — Telehealth: Payer: Self-pay | Admitting: Orthopaedic Surgery

## 2023-03-09 ENCOUNTER — Telehealth: Payer: Self-pay

## 2023-03-09 NOTE — Telephone Encounter (Signed)
Patient aware of the below message  

## 2023-03-09 NOTE — Telephone Encounter (Signed)
Talked with PT with [facility] and advised her that a message has been sent and that a call will be returned.

## 2023-03-09 NOTE — Telephone Encounter (Signed)
Patient is having a unusual amount of pain and [facility] calling to report. Sending call to Triage.

## 2023-03-10 ENCOUNTER — Telehealth: Payer: Self-pay | Admitting: Orthopaedic Surgery

## 2023-03-10 NOTE — Telephone Encounter (Signed)
noted 

## 2023-03-10 NOTE — Telephone Encounter (Signed)
Marchelle Folks (PT Assistant) called from [facility] health called with an pain level for pt. Pt pt's are 8 out of 10. Amanda phone number is [phone].

## 2023-03-11 ENCOUNTER — Encounter: Payer: Self-pay | Admitting: Orthopaedic Surgery

## 2023-03-11 ENCOUNTER — Ambulatory Visit (INDEPENDENT_AMBULATORY_CARE_PROVIDER_SITE_OTHER): Payer: BC Managed Care – PPO | Admitting: Orthopaedic Surgery

## 2023-03-11 ENCOUNTER — Other Ambulatory Visit: Payer: Self-pay

## 2023-03-11 DIAGNOSIS — Z96652 Presence of left artificial knee joint: Secondary | ICD-10-CM

## 2023-03-11 NOTE — Progress Notes (Signed)
The patient is a very pleasant 62 year old female who is here for her first visit status post a left total knee arthroplasty.  Unfortunately she is been on chronic narcotics before surgery so her postoperative pain control is been more difficult.  She is also very young.  She is not obese.  She has vibrant nerves and muscles.  On exam her extension is full but her flexion is only to about 30 degrees.  She understands she has to push this incredibly hard or we will most likely end up having to manipulate her knee and she understands that as well.  I had a long and thorough discussion with her about this.  Her incision looks great and the staples were removed.  Her calf is soft and her motor exam is normal in the foot and ankle.  She will push herself the heart through therapy and we will set her up for outpatient therapy.  Will see her back in just 3 weeks and if her flexion is not improved we will have to recommend a manipulation under anesthesia and she understands that as well.

## 2023-03-15 ENCOUNTER — Ambulatory Visit: Payer: BC Managed Care – PPO | Admitting: Physical Therapy

## 2023-03-15 ENCOUNTER — Other Ambulatory Visit: Payer: Self-pay

## 2023-03-15 ENCOUNTER — Encounter: Payer: Self-pay | Admitting: Physical Therapy

## 2023-03-15 DIAGNOSIS — M6281 Muscle weakness (generalized): Secondary | ICD-10-CM | POA: Diagnosis not present

## 2023-03-15 DIAGNOSIS — M25562 Pain in left knee: Secondary | ICD-10-CM

## 2023-03-15 DIAGNOSIS — M25662 Stiffness of left knee, not elsewhere classified: Secondary | ICD-10-CM

## 2023-03-15 DIAGNOSIS — R2689 Other abnormalities of gait and mobility: Secondary | ICD-10-CM | POA: Diagnosis not present

## 2023-03-15 DIAGNOSIS — R600 Localized edema: Secondary | ICD-10-CM

## 2023-03-15 DIAGNOSIS — R2681 Unsteadiness on feet: Secondary | ICD-10-CM

## 2023-03-15 NOTE — Therapy (Signed)
OUTPATIENT PHYSICAL THERAPY LOWER EXTREMITY EVALUATION   Patient Name: [name] MRN: [id number] DOB:[birth year], 62 y.o., female Today's Date: 03/15/2023  END OF SESSION:  PT End of Session - 03/15/23 1414     Visit Number 1    Number of Visits 16    Date for PT Re-Evaluation 05/10/23    Authorization Type [employer] State $72 copay    PT Start Time 1410    PT Stop Time 1450    PT Time Calculation (min) 40 min    Activity Tolerance Patient tolerated treatment well;Patient limited by pain    Behavior During Therapy [facility] for tasks assessed/performed             Past Medical History:  Diagnosis Date   Arthritis    Campylobacter enteritis May 2012   [facility] admission   Carpal tunnel syndrome, bilateral    Diabetes mellitus    Gastritis with bleeding due to alcohol 2010   [facility] admission   GERD (gastroesophageal reflux disease)    Hyperlipidemia    Hypertension    no meds   Menopause    Myocardial infarction    Neck pain    Wears glasses    Past Surgical History:  Procedure Laterality Date   CARPAL TUNNEL RELEASE Left 05/14/2014   Procedure: LEFT ULNAR NEUROPLASTY AT ELBOW AND ENDOSCOPIC CARPAL TUNNEL RELEASE;  Surgeon: Jodi Marble, MD;  Location: [facility];  Service: Orthopedics;  Laterality: Left;   CORONARY STENT INTERVENTION N/A 10/09/2019   Procedure: CORONARY STENT INTERVENTION;  Surgeon: Yates Decamp, MD;  [facility]: [facility];  Service: Cardiovascular;  Laterality: N/A;   CYST EXCISION  1995   under arms   endometrial biopsy  2004   benign   FOOT OSTEOTOMY  2010   right-spurs   LEFT HEART CATH AND CORONARY ANGIOGRAPHY N/A 10/09/2019   Procedure: LEFT HEART CATH AND CORONARY ANGIOGRAPHY;  Surgeon: Yates Decamp, MD;  [facility]: [facility];  Service: Cardiovascular;  Laterality: N/A;   TONSILLECTOMY     TOTAL KNEE ARTHROPLASTY Left 02/26/2023   Procedure: LEFT TOTAL KNEE ARTHROPLASTY;  Surgeon: Kathryne Hitch, MD;  Location:  [facility];  Service: Orthopedics;  Laterality: Left;  Needs RNFA   ULNAR NERVE TRANSPOSITION Left 05/14/2014   Procedure: ULNAR NERVE DECOMPRESSION/TRANSPOSITION;  Surgeon: Jodi Marble, MD;  Location:  SUR[facility]ERY CENTER;  Service: Orthopedics;  Laterality: Left;   Patient Active Problem List   Diagnosis Date Noted   Status post left knee replacement 02/26/2023   Unilateral primary osteoarthritis, left knee 09/09/2022   Intractable nausea and vomiting 07/04/2022   Hematemesis 07/04/2022   Hypokalemia 07/04/2022   Lactic acidosis 07/04/2022   Pain due to onychomycosis of toenails of both feet 04/28/2022   Diabetic neuropathy, type II diabetes mellitus 04/28/2022   De Quervain's tenosynovitis, left 11/13/2021   Bilateral injections given May 21, 2020 degenerative arthritis of knee, bilateral 05/21/2020   Traumatic hematoma of female breast 11/29/2019   Dizziness 11/29/2019   Hospital discharge follow-up 10/23/2019   NSTEMI (non-ST elevated myocardial infarction) 10/10/2019   CAD (coronary artery disease) 10/10/2019   Chest pain 10/09/2019   Elevated troponin 10/09/2019   Uncontrolled type 2 diabetes mellitus with hyperglycemia 10/09/2019   Obesity (BMI 30.0-34.9) 10/09/2019   Cervical spondylitis with radiculitis 08/29/2019   Acute bursitis of left shoulder 01/30/2019   Chronic midline posterior neck pain 06/28/2018   Urgency incontinence 06/28/2018   Degenerative joint disease of knee, left  04/26/2018   Carpal tunnel syndrome on both sides 03/29/2018   Anemia 01/02/2018   Class 1 obesity with serious comorbidity and body mass index (BMI) of 32.0 to 32.9 in adult 07/23/2017   Left hip pain 05/04/2017   Carpal tunnel syndrome on right 12/27/2016   Polyarthralgia 11/09/2016   Uncontrolled type 2 diabetes mellitus with retinopathy, with long-term current use of insulin 07/07/2016   Visit for preventive health examination 06/21/2016   Increased urinary frequency 06/21/2016    Chronic meniscal tear of knee 08/19/2015   Left knee pain 06/19/2015   Candidiasis of skin 06/18/2015   Genital herpes 07/31/2014   Ulnar nerve compression 09/21/2013   Noncompliance with diet and medication regimen 11/15/2012   Boil, axilla 08/14/2012   Arthritis    Screening for breast cancer 08/04/2011   Screening for colon cancer 08/04/2011   HIDRADENITIS SUPPURATIVA 04/11/2007   GERD 11/17/2006   ENDOMETRIAL POLYP 11/17/2006   LOW BACK PAIN 11/17/2006   FIBROIDS, UTERUS 11/16/2006   Hyperlipidemia associated with type 2 diabetes mellitus 11/16/2006   Essential hypertension 11/16/2006    PCP: Georganna Skeans, MD  REFERRING PROVIDER: Kathryne Hitch, MD  REFERRING DIAG: [id number] (ICD-10-CM) - Status post left knee replacement  THERAPY DIAG:  Acute pain of left knee - Plan: PT plan of care cert/re-cert  Stiffness of left knee, not elsewhere classified - Plan: PT plan of care cert/re-cert  Muscle weakness (generalized) - Plan: PT plan of care cert/re-cert  Other abnormalities of gait and mobility - Plan: PT plan of care cert/re-cert  Unsteadiness on feet - Plan: PT plan of care cert/re-cert  Localized edema - Plan: PT plan of care cert/re-cert  Rationale for Evaluation and Treatment: Rehabilitation  ONSET DATE: 02/26/23  SUBJECTIVE:   SUBJECTIVE STATEMENT: Pt s/p Lt TKA on 02/26/23.  She had HHPT x 6 visits and also one fall after surgery.  She c/o weakness throughout her body with extended activity.  PERTINENT HISTORY: OA, DM, HLD, HTN, MI s/p stent  PAIN:  Are you having pain? Yes: NPRS scale: 9 currently; up to 10, at best 7/10 Pain location: Lt knee Pain description: throbbing, nauseating Aggravating factors: constant, pressure Relieving factors: repositioning, medications  PRECAUTIONS: Fall  WEIGHT BEARING RESTRICTIONS: No  FALLS:  Has patient fallen in last 6 months? Yes. 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[employer]; currently out on leave  PLOF: Independent and Leisure: fishing  PATIENT GOALS: improve mobility and pain  NEXT MD VISIT: Not scheduled  OBJECTIVE:   PATIENT SURVEYS:  03/15/23: FOTO 24 (predicted 49)  COGNITION: Overall cognitive status: Within functional limits for tasks assessed     SENSATION: WFL  EDEMA:  03/15/23: Circumferential: Knee Joint Line: Rt: 34.5 cm/Lt: 39 cm   PALPATION: 03/15/23: very tender to light touch  LOWER EXTREMITY ROM:  ROM Left eval  Knee flexion P: 42 A: 40  Knee extension P: -6 A: Unable   (Blank rows = not tested)  LOWER EXTREMITY MMT:    03/15/23: Not formally tested but grossly 2/5 Lt knee  MMT Right eval Left eval  Knee flexion    Knee extension     (Blank rows = not tested)  FUNCTIONAL TESTS:  03/15/23:  increased difficulty rising from a chair; decreased  gait velocity  GAIT: 03/15/23 Distance walked: 100' Assistive device utilized: Environmental consultant - 2 wheeled Level of assistance: SBA Comments: antalgic gait, decreased speed   TODAY'S TREATMENT:                                                                                                                              DATE:  03/15/23  See HEP - performed trial reps with mod cues needed   PATIENT EDUCATION:  Education details: HEP Person educated: Patient Education method: Explanation, Demonstration, and Handouts Education comprehension: verbalized understanding, returned demonstration, and needs further education  HOME EXERCISE PROGRAM: Access Code: [email or web address] URL: [email or web address] Date: 03/15/2023 Prepared by: Moshe Cipro  Exercises - Quad Set  - 5-10 x daily - 7 x weekly - 1 sets - 5-10 reps - 5 sec hold - Supine  Heel Slide with Strap  - 5-10 x daily - 7 x weekly - 1 sets - 5-10 reps - Seated Heel Slide  - 5-10 x daily - 7 x weekly - 1 sets - 5-10 reps - Seated Quad Set  - 5-10 x daily - 7 x weekly - 1 sets - 5-10 reps - 5 sec hold  ASSESSMENT:  CLINICAL IMPRESSION: Patient is a 62 y.o. female who was seen today for physical therapy evaluation and treatment for Lt TKA on 02/26/23.  She demonstrates decreased strength and ROM, gait abnormalities and balance deficits as well as expected post op pain and swelling affecting functional mobility.  She will benefit from PT to address deficits listed.    OBJECTIVE IMPAIRMENTS: Abnormal gait, decreased balance, decreased endurance, decreased mobility, difficulty walking, decreased ROM, decreased strength, hypomobility, increased edema, increased fascial restrictions, increased muscle spasms, and pain.   ACTIVITY LIMITATIONS: carrying, lifting, bending, sitting, standing, squatting, sleeping, stairs, transfers, bed mobility, toileting, dressing, and locomotion level  PARTICIPATION LIMITATIONS: meal prep, cleaning, laundry, driving, shopping, community activity, and occupation  PERSONAL FACTORS: 3+ comorbidities: OA, DM, HLD, HTN, MI s/p stent  are also affecting patient's functional outcome.   REHAB POTENTIAL: Fair elevated pain, limited ROM and tolerance to activity  CLINICAL DECISION MAKING: Evolving/moderate complexity  EVALUATION COMPLEXITY: Moderate   GOALS: Goals reviewed with patient? Yes  SHORT TERM GOALS: Target date: 04/12/2023  Independent with initial HEP Goal status: INITIAL  2.  Lt knee AROM 5-60 deg for improved function Goal status: INITIAL  LONG TERM GOALS: Target date: 05/10/2023  Independent with final HEP Goal status: INITIAL  2.  FOTO score improved to 49 Goal status: INITIAL  3.  Lt knee AROM improved to 0-90 for improved mobility Goal status: INIITAL  4.  Report pain < 5/10 with standing and walking for improved  function Goal status: INITIAL  5.  Demonstrate ability to amb with LRAD modified independent for improved mobility Goal status: INITIAL  6.  Perform work simulated tasks for improved function  Goal status: INITIAL     PLAN:  PT FREQUENCY:  2-3x/wk  PT DURATION: 8 weeks  PLANNED INTERVENTIONS: Therapeutic exercises, Therapeutic activity, Neuromuscular re-education, Balance training, Gait training, Patient/Family education, Self Care, Joint mobilization, Stair training, DME instructions, Aquatic Therapy, Dry Needling, Electrical stimulation, Cryotherapy, Moist heat, Taping, Vasopneumatic device, Manual therapy, and Re-evaluation  PLAN FOR NEXT SESSION: review HEP, aggressive flexion focus as tolerated, quad strengthening   Clarita Crane, PT, DPT 03/15/23 2:56 PM

## 2023-03-17 ENCOUNTER — Ambulatory Visit (INDEPENDENT_AMBULATORY_CARE_PROVIDER_SITE_OTHER): Payer: BC Managed Care – PPO | Admitting: Rehabilitative and Restorative Service Providers"

## 2023-03-17 ENCOUNTER — Encounter: Payer: Self-pay | Admitting: Rehabilitative and Restorative Service Providers"

## 2023-03-17 DIAGNOSIS — R2689 Other abnormalities of gait and mobility: Secondary | ICD-10-CM

## 2023-03-17 DIAGNOSIS — M25562 Pain in left knee: Secondary | ICD-10-CM | POA: Diagnosis not present

## 2023-03-17 DIAGNOSIS — R2681 Unsteadiness on feet: Secondary | ICD-10-CM

## 2023-03-17 DIAGNOSIS — R600 Localized edema: Secondary | ICD-10-CM

## 2023-03-17 DIAGNOSIS — M25662 Stiffness of left knee, not elsewhere classified: Secondary | ICD-10-CM

## 2023-03-17 DIAGNOSIS — M6281 Muscle weakness (generalized): Secondary | ICD-10-CM

## 2023-03-17 NOTE — Therapy (Addendum)
OUTPATIENT PHYSICAL THERAPY TREATMENT   Patient Name: [name] MRN: [id number] DOB:26-Feb-1961, 62 y.o., female Today's Date: 03/17/2023  END OF SESSION:  PT End of Session - 03/17/23 0914     Visit Number 2    Number of Visits 16    Date for PT Re-Evaluation 05/10/23    Authorization Type [employer] State $72 copay    PT Start Time 0916    PT Stop Time 0956    PT Time Calculation (min) 40 min    Activity Tolerance Patient limited by pain    Behavior During Therapy [facility] for tasks assessed/performed              Past Medical History:  Diagnosis Date   Arthritis    Campylobacter enteritis May 2012   [facility] admission   Carpal tunnel syndrome, bilateral    Diabetes mellitus    Gastritis with bleeding due to alcohol 2010   [facility] admission   GERD (gastroesophageal reflux disease)    Hyperlipidemia    Hypertension    no meds   Menopause    Myocardial infarction    Neck pain    Wears glasses    Past Surgical History:  Procedure Laterality Date   CARPAL TUNNEL RELEASE Left 05/14/2014   Procedure: LEFT ULNAR NEUROPLASTY AT ELBOW AND ENDOSCOPIC CARPAL TUNNEL RELEASE;  Surgeon: Jodi Marble, MD;  Location: [facility];  Service: Orthopedics;  Laterality: Left;   CORONARY STENT INTERVENTION N/A 10/09/2019   Procedure: CORONARY STENT INTERVENTION;  Surgeon: Yates Decamp, MD;  [facility]: [facility];  Service: Cardiovascular;  Laterality: N/A;   CYST EXCISION  1995   under arms   endometrial biopsy  2004   benign   FOOT OSTEOTOMY  2010   right-spurs   LEFT HEART CATH AND CORONARY ANGIOGRAPHY N/A 10/09/2019   Procedure: LEFT HEART CATH AND CORONARY ANGIOGRAPHY;  Surgeon: Yates Decamp, MD;  [facility]: [facility];  Service: Cardiovascular;  Laterality: N/A;   TONSILLECTOMY     TOTAL KNEE ARTHROPLASTY Left 02/26/2023   Procedure: LEFT TOTAL KNEE ARTHROPLASTY;  Surgeon: Kathryne Hitch, MD;  Location: [facility];  Service: Orthopedics;  Laterality: Left;   Needs RNFA   ULNAR NERVE TRANSPOSITION Left 05/14/2014   Procedure: ULNAR NERVE DECOMPRESSION/TRANSPOSITION;  Surgeon: Jodi Marble, MD;  Location: [facility];  Service: Orthopedics;  Laterality: Left;   Patient Active Problem List   Diagnosis Date Noted   Status post left knee replacement 02/26/2023   Unilateral primary osteoarthritis, left knee 09/09/2022   Intractable nausea and vomiting 07/04/2022   Hematemesis 07/04/2022   Hypokalemia 07/04/2022   Lactic acidosis 07/04/2022   Pain due to onychomycosis of toenails of both feet 04/28/2022   Diabetic neuropathy, type II diabetes mellitus 04/28/2022   De Quervain's tenosynovitis, left 11/13/2021   Bilateral injections given May 21, 2020 degenerative arthritis of knee, bilateral 05/21/2020   Traumatic hematoma of female breast 11/29/2019   Dizziness 11/29/2019   Hospital discharge follow-up 10/23/2019   NSTEMI (non-ST elevated myocardial infarction) 10/10/2019   CAD (coronary artery disease) 10/10/2019   Chest pain 10/09/2019   Elevated troponin 10/09/2019   Uncontrolled type 2 diabetes mellitus with hyperglycemia 10/09/2019   Obesity (BMI 30.0-34.9) 10/09/2019   Cervical spondylitis with radiculitis 08/29/2019   Acute bursitis of left shoulder 01/30/2019   Chronic midline posterior neck pain 06/28/2018   Urgency incontinence 06/28/2018   Degenerative joint disease of knee, left 04/26/2018   Carpal  tunnel syndrome on both sides 03/29/2018   Anemia 01/02/2018   Class 1 obesity with serious comorbidity and body mass index (BMI) of 32.0 to 32.9 in adult 07/23/2017   Left hip pain 05/04/2017   Carpal tunnel syndrome on right 12/27/2016   Polyarthralgia 11/09/2016   Uncontrolled type 2 diabetes mellitus with retinopathy, with long-term current use of insulin 07/07/2016   Visit for preventive health examination 06/21/2016   Increased urinary frequency 06/21/2016   Chronic meniscal tear of knee 08/19/2015   Left  knee pain 06/19/2015   Candidiasis of skin 06/18/2015   Genital herpes 07/31/2014   Ulnar nerve compression 09/21/2013   Noncompliance with diet and medication regimen 11/15/2012   Boil, axilla 08/14/2012   Arthritis    Screening for breast cancer 08/04/2011   Screening for colon cancer 08/04/2011   HIDRADENITIS SUPPURATIVA 04/11/2007   GERD 11/17/2006   ENDOMETRIAL POLYP 11/17/2006   LOW BACK PAIN 11/17/2006   FIBROIDS, UTERUS 11/16/2006   Hyperlipidemia associated with type 2 diabetes mellitus 11/16/2006   Essential hypertension 11/16/2006    PCP: Georganna Skeans, MD  REFERRING PROVIDER: Kathryne Hitch, MD  REFERRING DIAG: [phone] (ICD-10-CM) - Status post left knee replacement  THERAPY DIAG:  Acute pain of left knee - Plan: PT plan of care cert/re-cert  Stiffness of left knee, not elsewhere classified - Plan: PT plan of care cert/re-cert  Muscle weakness (generalized) - Plan: PT plan of care cert/re-cert  Other abnormalities of gait and mobility - Plan: PT plan of care cert/re-cert  Unsteadiness on feet - Plan: PT plan of care cert/re-cert  Localized edema - Plan: PT plan of care cert/re-cert  Rationale for Evaluation and Treatment: Rehabilitation  ONSET DATE: 02/26/23  SUBJECTIVE:   SUBJECTIVE STATEMENT: She indicated consistent pain in knee and difficulty getting it moving.   PERTINENT HISTORY: OA, DM, HLD, HTN, MI s/p stent  PAIN:  NPRS scale: "severe" Pain location: Lt knee Pain description: throbbing, nauseating Aggravating factors: constant, pressure, movement pain Relieving factors: repositioning, medications  PRECAUTIONS: Fall  WEIGHT BEARING RESTRICTIONS: No  FALLS:  Has patient fallen in last 6 months? Yes. 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[employer]; currently out on leave  PLOF: Independent and Leisure: fishing  PATIENT GOALS: improve mobility and pain  NEXT MD VISIT: Not scheduled  OBJECTIVE:   PATIENT SURVEYS:  03/15/23: FOTO 24 (predicted 49)  COGNITION: Overall cognitive status: Within functional limits for tasks assessed     SENSATION: WFL  EDEMA:  03/15/23: Circumferential: Knee Joint Line: Rt: 34.5 cm/Lt: 39 cm   PALPATION: 03/15/23: very tender to light touch  LOWER EXTREMITY ROM:  ROM Left 03/15/23  Knee flexion P: 42 A: 40  Knee extension P: -6 A: Unable   (Blank rows = not tested)  LOWER EXTREMITY MMT:    03/15/23: Not formally tested but grossly 2/5 Lt knee  MMT Right eval Left eval  Knee flexion    Knee extension     (Blank rows = not tested)  FUNCTIONAL TESTS:  03/15/23:  increased difficulty rising from a chair; decreased gait velocity  GAIT: 03/15/23 Distance walked: 100' Assistive device utilized: Environmental consultant - 2 wheeled Level of assistance: SBA Comments: antalgic gait, decreased speed   TODAY'S TREATMENT:  DATE: 03/17/2023 Manual: Seated Lt knee flexion mobilization c movement IR/distraction, contract/relax techniques for quad inhibition and long duration stretching.   Therex: Seated isometric extension/flexion alternating 5 sec each way x 6 - x 2 Seated Lt leg quad set 5 sec hold x 10 Seated Lt knee flexion stretch c gravity only 2 mins (cues for home use) Incline gastroc stretch standing bilateral 30 sec x 3 Nustep Lvl 4 6 mins - ROM for 50 seconds and 10 second stretch in flexion each minute    TODAY'S TREATMENT:                                                         DATE: 03/15/23  See HEP - performed trial reps with mod cues needed   PATIENT EDUCATION:  Education details: HEP Person educated: Patient Education method: Explanation,  Demonstration, and Handouts Education comprehension: verbalized understanding, returned demonstration, and needs further education  HOME EXERCISE PROGRAM: Access Code: [email or web address] URL: [email or web address] Date: 03/15/2023 Prepared by: Moshe Cipro  Exercises - Quad Set  - 5-10 x daily - 7 x weekly - 1 sets - 5-10 reps - 5 sec hold - Supine Heel Slide with Strap  - 5-10 x daily - 7 x weekly - 1 sets - 5-10 reps - Seated Heel Slide  - 5-10 x daily - 7 x weekly - 1 sets - 5-10 reps - Seated Quad Set  - 5-10 x daily - 7 x weekly - 1 sets - 5-10 reps - 5 sec hold  ASSESSMENT:  CLINICAL IMPRESSION: Combination of muscle guarding, pain, and capsular tightness limits for Lt knee flexion.  Progressive long duration stretching provided beneficial to improve movement by 6-8 degrees after manual/stretching.  Continued difficulty with pain in flexion created large impairment for movement patterns and general tolerance to daily activity.  Continued skilled PT services indicated at this time.   OBJECTIVE IMPAIRMENTS: Abnormal gait, decreased balance, decreased endurance, decreased mobility, difficulty walking, decreased ROM, decreased strength, hypomobility, increased edema, increased fascial restrictions, increased muscle spasms, and pain.   ACTIVITY LIMITATIONS: carrying, lifting, bending, sitting, standing, squatting, sleeping, stairs, transfers, bed mobility, toileting, dressing, and locomotion level  PARTICIPATION LIMITATIONS: meal prep, cleaning, laundry, driving, shopping, community activity, and occupation  PERSONAL FACTORS: 3+ comorbidities: OA, DM, HLD, HTN, MI s/p stent  are also affecting patient's functional outcome.   REHAB POTENTIAL: Fair elevated pain, limited ROM and tolerance to activity  CLINICAL DECISION MAKING: Evolving/moderate complexity  EVALUATION COMPLEXITY: Moderate   GOALS: Goals reviewed with patient? Yes  SHORT TERM GOALS: Target date:  04/12/2023  Independent with initial HEP Goal status: on going 03/17/2023  2.  Lt knee AROM 5-60 deg for improved function Goal status: on going 03/17/2023  LONG TERM GOALS: Target date: 05/10/2023  Independent with final HEP Goal status: INITIAL  2.  FOTO score improved to 49 Goal status: INITIAL  3.  Lt knee AROM improved to 0-90 for improved mobility Goal status: INIITAL  4.  Report pain < 5/10 with standing and walking for improved function Goal status: INITIAL  5.  Demonstrate ability to amb with LRAD modified independent for improved mobility Goal status: INITIAL  6.  Perform work simulated tasks for improved function  Goal status: INITIAL     PLAN:  PT FREQUENCY:  2-3x/wk  PT DURATION:  8 weeks  PLANNED INTERVENTIONS: Therapeutic exercises, Therapeutic activity, Neuromuscular re-education, Balance training, Gait training, Patient/Family education, Self Care, Joint mobilization, Stair training, DME instructions, Aquatic Therapy, Dry Needling, Electrical stimulation, Cryotherapy, Moist heat, Taping, Vasopneumatic device, Manual therapy, and Re-evaluation  PLAN FOR NEXT SESSION: Manual and therex for mobility gains to tolerance as big focus to allow progression for other strengthening/training.    Chyrel Masson, PT, DPT, OCS, ATC 03/17/23  9:54 AM

## 2023-03-18 ENCOUNTER — Ambulatory Visit (INDEPENDENT_AMBULATORY_CARE_PROVIDER_SITE_OTHER): Payer: BC Managed Care – PPO | Admitting: Rehabilitative and Restorative Service Providers"

## 2023-03-18 ENCOUNTER — Encounter: Payer: Self-pay | Admitting: Rehabilitative and Restorative Service Providers"

## 2023-03-18 DIAGNOSIS — R2681 Unsteadiness on feet: Secondary | ICD-10-CM

## 2023-03-18 DIAGNOSIS — M6281 Muscle weakness (generalized): Secondary | ICD-10-CM

## 2023-03-18 DIAGNOSIS — R2689 Other abnormalities of gait and mobility: Secondary | ICD-10-CM | POA: Diagnosis not present

## 2023-03-18 DIAGNOSIS — R600 Localized edema: Secondary | ICD-10-CM

## 2023-03-18 DIAGNOSIS — M25662 Stiffness of left knee, not elsewhere classified: Secondary | ICD-10-CM | POA: Diagnosis not present

## 2023-03-18 DIAGNOSIS — M25562 Pain in left knee: Secondary | ICD-10-CM

## 2023-03-18 NOTE — Therapy (Signed)
OUTPATIENT PHYSICAL THERAPY TREATMENT   Patient Name: [name] MRN: [id number] DOB:01/30/1961, 62 y.o., female Today's Date: 03/18/2023  END OF SESSION:  PT End of Session - 03/18/23 0806     Visit Number 3    Number of Visits 16    Date for PT Re-Evaluation 05/10/23    Authorization Type [employer] State $72 copay    PT Start Time 0759    PT Stop Time 0838    PT Time Calculation (min) 39 min    Activity Tolerance Patient limited by pain    Behavior During Therapy [facility] for tasks assessed/performed               Past Medical History:  Diagnosis Date   Arthritis    Campylobacter enteritis May 2012   [facility] admission   Carpal tunnel syndrome, bilateral    Diabetes mellitus    Gastritis with bleeding due to alcohol 2010   [facility] admission   GERD (gastroesophageal reflux disease)    Hyperlipidemia    Hypertension    no meds   Menopause    Myocardial infarction    Neck pain    Wears glasses    Past Surgical History:  Procedure Laterality Date   CARPAL TUNNEL RELEASE Left 05/14/2014   Procedure: LEFT ULNAR NEUROPLASTY AT ELBOW AND ENDOSCOPIC CARPAL TUNNEL RELEASE;  Surgeon: Jodi Marble, MD;  Location: [facility];  Service: Orthopedics;  Laterality: Left;   CORONARY STENT INTERVENTION N/A 10/09/2019   Procedure: CORONARY STENT INTERVENTION;  Surgeon: Yates Decamp, MD;  Location: [facility];  Service: Cardiovascular;  Laterality: N/A;   CYST EXCISION  1995   under arms   endometrial biopsy  2004   benign   FOOT OSTEOTOMY  2010   right-spurs   LEFT HEART CATH AND CORONARY ANGIOGRAPHY N/A 10/09/2019   Procedure: LEFT HEART CATH AND CORONARY ANGIOGRAPHY;  Surgeon: Yates Decamp, MD;  Location: [facility];  Service: Cardiovascular;  Laterality: N/A;   TONSILLECTOMY     TOTAL KNEE ARTHROPLASTY Left 02/26/2023   Procedure: LEFT TOTAL KNEE ARTHROPLASTY;  Surgeon: Kathryne Hitch, MD;  Location: [facility];  Service: Orthopedics;  Laterality:  Left;  Needs RNFA   ULNAR NERVE TRANSPOSITION Left 05/14/2014   Procedure: ULNAR NERVE DECOMPRESSION/TRANSPOSITION;  Surgeon: Jodi Marble, MD;  Location: [facility];  Service: Orthopedics;  Laterality: Left;   Patient Active Problem List   Diagnosis Date Noted   Status post left knee replacement 02/26/2023   Unilateral primary osteoarthritis, left knee 09/09/2022   Intractable nausea and vomiting 07/04/2022   Hematemesis 07/04/2022   Hypokalemia 07/04/2022   Lactic acidosis 07/04/2022   Pain due to onychomycosis of toenails of both feet 04/28/2022   Diabetic neuropathy, type II diabetes mellitus 04/28/2022   De Quervain's tenosynovitis, left 11/13/2021   Bilateral injections given May 21, 2020 degenerative arthritis of knee, bilateral 05/21/2020   Traumatic hematoma of female breast 11/29/2019   Dizziness 11/29/2019   Hospital discharge follow-up 10/23/2019   NSTEMI (non-ST elevated myocardial infarction) 10/10/2019   CAD (coronary artery disease) 10/10/2019   Chest pain 10/09/2019   Elevated troponin 10/09/2019   Uncontrolled type 2 diabetes mellitus with hyperglycemia 10/09/2019   Obesity (BMI 30.0-34.9) 10/09/2019   Cervical spondylitis with radiculitis 08/29/2019   Acute bursitis of left shoulder 01/30/2019   Chronic midline posterior neck pain 06/28/2018   Urgency incontinence 06/28/2018   Degenerative joint disease of knee, left 04/26/2018  Carpal tunnel syndrome on both sides 03/29/2018   Anemia 01/02/2018   Class 1 obesity with serious comorbidity and body mass index (BMI) of 32.0 to 32.9 in adult 07/23/2017   Left hip pain 05/04/2017   Carpal tunnel syndrome on right 12/27/2016   Polyarthralgia 11/09/2016   Uncontrolled type 2 diabetes mellitus with retinopathy, with long-term current use of insulin 07/07/2016   Visit for preventive health examination 06/21/2016   Increased urinary frequency 06/21/2016   Chronic meniscal tear of knee 08/19/2015    Left knee pain 06/19/2015   Candidiasis of skin 06/18/2015   Genital herpes 07/31/2014   Ulnar nerve compression 09/21/2013   Noncompliance with diet and medication regimen 11/15/2012   Boil, axilla 08/14/2012   Arthritis    Screening for breast cancer 08/04/2011   Screening for colon cancer 08/04/2011   HIDRADENITIS SUPPURATIVA 04/11/2007   GERD 11/17/2006   ENDOMETRIAL POLYP 11/17/2006   LOW BACK PAIN 11/17/2006   FIBROIDS, UTERUS 11/16/2006   Hyperlipidemia associated with type 2 diabetes mellitus 11/16/2006   Essential hypertension 11/16/2006    PCP: Georganna Skeans, MD  REFERRING PROVIDER: Kathryne Hitch, MD  REFERRING DIAG: [phone] (ICD-10-CM) - Status post left knee replacement   THERAPY DIAG:  Acute pain of left knee  Stiffness of left knee, not elsewhere classified  Muscle weakness (generalized)  Other abnormalities of gait and mobility  Unsteadiness on feet  Localized edema  Rationale for Evaluation and Treatment: Rehabilitation  ONSET DATE: 02/26/23  SUBJECTIVE:   SUBJECTIVE STATEMENT: She indicated feeling stiff after last visit when riding home in car.  Continued similar complaints overall indicated.   PERTINENT HISTORY: OA, DM, HLD, HTN, MI s/p stent  PAIN:  NPRS scale: 8/10 Pain location: Lt knee Pain description: throbbing, nauseating Aggravating factors: constant, pressure, end range movements Relieving factors: repositioning, medications  PRECAUTIONS: Fall  WEIGHT BEARING RESTRICTIONS: No  FALLS:  Has patient fallen in last 6 months? Yes. 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[employer]; currently out on leave  PLOF: Independent and Leisure: fishing  PATIENT GOALS: improve  mobility and pain  NEXT MD VISIT: Not scheduled  OBJECTIVE:   PATIENT SURVEYS:  03/15/23: FOTO 24 (predicted 49)  COGNITION: Overall cognitive status: Within functional limits for tasks assessed     SENSATION: WFL  EDEMA:  03/15/23: Circumferential: Knee Joint Line: Rt: 34.5 cm/Lt: 39 cm   PALPATION: 03/15/23: very tender to light touch  LOWER EXTREMITY ROM:  ROM Left 03/15/23  Knee flexion P: 42 A: 40  Knee extension P: -6 A: Unable   (Blank rows = not tested)  LOWER EXTREMITY MMT:    03/15/23: Not formally tested but grossly 2/5 Lt knee  MMT Right eval Left eval  Knee flexion    Knee extension     (Blank rows = not tested)  FUNCTIONAL TESTS:  03/15/23:  increased difficulty rising from a chair; decreased gait velocity  GAIT: 03/15/23 Distance walked: 100' Assistive device utilized: Environmental consultant - 2 wheeled Level of assistance: SBA Comments: antalgic gait, decreased speed   TODAY'S TREATMENT:  DATE: 03/18/2023 Manual: Seated Lt knee flexion mobilization c movement IR/distraction, contract/relax techniques for quad inhibition and long duration stretching.   Pt performed self STM with tennis ball on quad throughout manual treatment.   Therex: Nustep Lvl 5 8 mins - ROM for 50 seconds and 10 second stretch in flexion each minute  Incline gastroc stretch standing bilateral 30 sec x 5 Seated long duration gravity assisted knee flexion hang 2 mins Seated LAQ end range pause each direction 1-2 seconds x 10  Seated isometric extension/flexion alternating 5 sec each way x 12 each  Attempted sit to stand from elevated table but limited due to Lt knee flexion mobility/pain (attempted 2 times)   TODAY'S TREATMENT:                                                         DATE: 03/17/2023 Manual: Seated Lt knee flexion mobilization c movement IR/distraction, contract/relax techniques for quad inhibition and long duration  stretching.   Therex: Seated isometric extension/flexion alternating 5 sec each way x 6 - x 2 Seated Lt leg quad set 5 sec hold x 10 Seated Lt knee flexion stretch c gravity only 2 mins (cues for home use) Incline gastroc stretch standing bilateral 30 sec x 3 Nustep Lvl 4 6 mins - ROM for 50 seconds and 10 second stretch in flexion each minute    TODAY'S TREATMENT:                                                         DATE: 03/15/23  See HEP - performed trial reps with mod cues needed   PATIENT EDUCATION:  03/18/2023 Education details: HEP update Person educated: Patient Education method: Explanation, Demonstration, and Handouts Education comprehension: verbalized understanding, returned demonstration, and needs further education  HOME EXERCISE PROGRAM: Access Code: [email or web address] URL: [email or web address] Date: 03/18/2023 Prepared by: Chyrel Masson  Exercises - Quad Set (Mirrored)  - 5-10 x daily - 7 x weekly - 1 sets - 5-10 reps - 5 sec hold - Supine Heel Slide with Strap  - 5-10 x daily - 7 x weekly - 1 sets - 5-10 reps - Seated Heel Slide (Mirrored)  - 5-10 x daily - 7 x weekly - 1 sets - 5-10 reps - Seated Quad Set (Mirrored)  - 5-10 x daily - 7 x weekly - 1 sets - 5-10 reps - 5 sec hold - Seated Long Arc Quad (Mirrored)  - 3-5 x daily - 7 x weekly - 1-2 sets - 10 reps - 2 hold  ASSESSMENT:  CLINICAL IMPRESSION: Fair tolerance at best to manual and therex activity promoting  end range extension and flexion mobility stretching.  Guarding noted throughout movements with pain increases noted.   High importance of mobility gains at this time.   OBJECTIVE IMPAIRMENTS: Abnormal gait, decreased balance, decreased endurance, decreased mobility, difficulty walking, decreased ROM, decreased strength, hypomobility, increased edema, increased fascial restrictions, increased muscle spasms, and pain.   ACTIVITY LIMITATIONS: carrying, lifting, bending, sitting, standing,  squatting, sleeping, stairs, transfers, bed mobility, toileting, dressing, and locomotion level  PARTICIPATION LIMITATIONS: meal prep, cleaning, laundry, driving, shopping, community activity,  and occupation  PERSONAL FACTORS: 3+ comorbidities: OA, DM, HLD, HTN, MI s/p stent  are also affecting patient's functional outcome.   REHAB POTENTIAL: Fair elevated pain, limited ROM and tolerance to activity  CLINICAL DECISION MAKING: Evolving/moderate complexity  EVALUATION COMPLEXITY: Moderate   GOALS: Goals reviewed with patient? Yes  SHORT TERM GOALS: Target date: 04/12/2023  Independent with initial HEP Goal status: on going 03/17/2023  2.  Lt knee AROM 5-60 deg for improved function Goal status: on going 03/17/2023  LONG TERM GOALS: Target date: 05/10/2023  Independent with final HEP Goal status: INITIAL  2.  FOTO score improved to 49 Goal status: INITIAL  3.  Lt knee AROM improved to 0-90 for improved mobility Goal status: INIITAL  4.  Report pain < 5/10 with standing and walking for improved function Goal status: INITIAL  5.  Demonstrate ability to amb with LRAD modified independent for improved mobility Goal status: INITIAL  6.  Perform work simulated tasks for improved function  Goal status: INITIAL     PLAN:  PT FREQUENCY:  2-3x/wk  PT DURATION: 8 weeks  PLANNED INTERVENTIONS: Therapeutic exercises, Therapeutic activity, Neuromuscular re-education, Balance training, Gait training, Patient/Family education, Self Care, Joint mobilization, Stair training, DME instructions, Aquatic Therapy, Dry Needling, Electrical stimulation, Cryotherapy, Moist heat, Taping, Vasopneumatic device, Manual therapy, and Re-evaluation  PLAN FOR NEXT SESSION: Manual and therex for mobility gains to tolerance continued.  Strengthening in available range.    Chyrel Masson, PT, DPT, OCS, ATC 03/18/23  8:37 AM

## 2023-03-22 ENCOUNTER — Encounter: Payer: Self-pay | Admitting: Physical Therapy

## 2023-03-22 ENCOUNTER — Telehealth: Payer: Self-pay | Admitting: Orthopaedic Surgery

## 2023-03-22 ENCOUNTER — Ambulatory Visit (INDEPENDENT_AMBULATORY_CARE_PROVIDER_SITE_OTHER): Payer: BC Managed Care – PPO | Admitting: Physical Therapy

## 2023-03-22 ENCOUNTER — Other Ambulatory Visit: Payer: Self-pay | Admitting: Orthopaedic Surgery

## 2023-03-22 DIAGNOSIS — M25662 Stiffness of left knee, not elsewhere classified: Secondary | ICD-10-CM | POA: Diagnosis not present

## 2023-03-22 DIAGNOSIS — M6281 Muscle weakness (generalized): Secondary | ICD-10-CM

## 2023-03-22 DIAGNOSIS — M25562 Pain in left knee: Secondary | ICD-10-CM | POA: Diagnosis not present

## 2023-03-22 DIAGNOSIS — R2681 Unsteadiness on feet: Secondary | ICD-10-CM

## 2023-03-22 DIAGNOSIS — R2689 Other abnormalities of gait and mobility: Secondary | ICD-10-CM | POA: Diagnosis not present

## 2023-03-22 DIAGNOSIS — R600 Localized edema: Secondary | ICD-10-CM

## 2023-03-22 MED ORDER — HYDROMORPHONE HCL 2 MG PO TABS: 2.0000 mg | ORAL_TABLET | Freq: Four times a day (QID) | ORAL | 0 refills | Status: DC | PRN

## 2023-03-22 NOTE — Telephone Encounter (Signed)
Patient called, she would like a refill on her pain medication. Hydrocodone. Her call back number is [phone]

## 2023-03-22 NOTE — Therapy (Signed)
OUTPATIENT PHYSICAL THERAPY TREATMENT   Patient Name: [name] MRN: [id number] DOB:07-11-1961, 62 y.o., female Today's Date: 03/22/2023  END OF SESSION:  PT End of Session - 03/22/23 0850     Visit Number 4    Number of Visits 16    Date for PT Re-Evaluation 05/10/23    Authorization Type BCBS State $72 copay    PT Start Time 0845    PT Stop Time 0940    PT Time Calculation (min) 55 min    Activity Tolerance Patient limited by pain    Behavior During Therapy [facility] for tasks assessed/performed               Past Medical History:  Diagnosis Date   Arthritis    Campylobacter enteritis May 2012   [facility] admission   Carpal tunnel syndrome, bilateral    Diabetes mellitus    Gastritis with bleeding due to alcohol 2010   [facility] admission   GERD (gastroesophageal reflux disease)    Hyperlipidemia    Hypertension    no meds   Menopause    Myocardial infarction    Neck pain    Wears glasses    Past Surgical History:  Procedure Laterality Date   CARPAL TUNNEL RELEASE Left 05/14/2014   Procedure: LEFT ULNAR NEUROPLASTY AT ELBOW AND ENDOSCOPIC CARPAL TUNNEL RELEASE;  Surgeon: Jodi Marble, MD;  Location: [facility];  Service: Orthopedics;  Laterality: Left;   CORONARY STENT INTERVENTION N/A 10/09/2019   Procedure: CORONARY STENT INTERVENTION;  Surgeon: Yates Decamp, MD;  Location: [facility];  Service: Cardiovascular;  Laterality: N/A;   CYST EXCISION  1995   under arms   endometrial biopsy  2004   benign   FOOT OSTEOTOMY  2010   right-spurs   LEFT HEART CATH AND CORONARY ANGIOGRAPHY N/A 10/09/2019   Procedure: LEFT HEART CATH AND CORONARY ANGIOGRAPHY;  Surgeon: Yates Decamp, MD;  Location: [facility];  Service: Cardiovascular;  Laterality: N/A;   TONSILLECTOMY     TOTAL KNEE ARTHROPLASTY Left 02/26/2023   Procedure: LEFT TOTAL KNEE ARTHROPLASTY;  Surgeon: Kathryne Hitch, MD;  Location: [facility];  Service: Orthopedics;  Laterality:  Left;  Needs RNFA   ULNAR NERVE TRANSPOSITION Left 05/14/2014   Procedure: ULNAR NERVE DECOMPRESSION/TRANSPOSITION;  Surgeon: Jodi Marble, MD;  Location: [facility];  Service: Orthopedics;  Laterality: Left;   Patient Active Problem List   Diagnosis Date Noted   Status post left knee replacement 02/26/2023   Unilateral primary osteoarthritis, left knee 09/09/2022   Intractable nausea and vomiting 07/04/2022   Hematemesis 07/04/2022   Hypokalemia 07/04/2022   Lactic acidosis 07/04/2022   Pain due to onychomycosis of toenails of both feet 04/28/2022   Diabetic neuropathy, type II diabetes mellitus 04/28/2022   De Quervain's tenosynovitis, left 11/13/2021   Bilateral injections given May 21, 2020 degenerative arthritis of knee, bilateral 05/21/2020   Traumatic hematoma of female breast 11/29/2019   Dizziness 11/29/2019   Hospital discharge follow-up 10/23/2019   NSTEMI (non-ST elevated myocardial infarction) 10/10/2019   CAD (coronary artery disease) 10/10/2019   Chest pain 10/09/2019   Elevated troponin 10/09/2019   Uncontrolled type 2 diabetes mellitus with hyperglycemia 10/09/2019   Obesity (BMI 30.0-34.9) 10/09/2019   Cervical spondylitis with radiculitis 08/29/2019   Acute bursitis of left shoulder 01/30/2019   Chronic midline posterior neck pain 06/28/2018   Urgency incontinence 06/28/2018   Degenerative joint disease of knee, left 04/26/2018  Carpal tunnel syndrome on both sides 03/29/2018   Anemia 01/02/2018   Class 1 obesity with serious comorbidity and body mass index (BMI) of 32.0 to 32.9 in adult 07/23/2017   Left hip pain 05/04/2017   Carpal tunnel syndrome on right 12/27/2016   Polyarthralgia 11/09/2016   Uncontrolled type 2 diabetes mellitus with retinopathy, with long-term current use of insulin 07/07/2016   Visit for preventive health examination 06/21/2016   Increased urinary frequency 06/21/2016   Chronic meniscal tear of knee 08/19/2015    Left knee pain 06/19/2015   Candidiasis of skin 06/18/2015   Genital herpes 07/31/2014   Ulnar nerve compression 09/21/2013   Noncompliance with diet and medication regimen 11/15/2012   Boil, axilla 08/14/2012   Arthritis    Screening for breast cancer 08/04/2011   Screening for colon cancer 08/04/2011   HIDRADENITIS SUPPURATIVA 04/11/2007   GERD 11/17/2006   ENDOMETRIAL POLYP 11/17/2006   LOW BACK PAIN 11/17/2006   FIBROIDS, UTERUS 11/16/2006   Hyperlipidemia associated with type 2 diabetes mellitus 11/16/2006   Essential hypertension 11/16/2006    PCP: Georganna Skeans, MD  REFERRING PROVIDER: Kathryne Hitch, MD  REFERRING DIAG: [phone] (ICD-10-CM) - Status post left knee replacement   THERAPY DIAG:  Acute pain of left knee  Stiffness of left knee, not elsewhere classified  Muscle weakness (generalized)  Other abnormalities of gait and mobility  Unsteadiness on feet  Localized edema  Rationale for Evaluation and Treatment: Rehabilitation  ONSET DATE: 02/26/23  SUBJECTIVE:   SUBJECTIVE STATEMENT: She did her exercises this weekend.  Her knee continues to bother her.    PERTINENT HISTORY: OA, DM, HLD, HTN, MI s/p stent  PAIN:  NPRS scale: this morning 7/10 since last PT lowest 4/10 & 8/10 Pain location: Lt knee Pain description: throbbing, nauseating Aggravating factors: constant, pressure, end range movements Relieving factors: repositioning, medications  PRECAUTIONS: Fall  WEIGHT BEARING RESTRICTIONS: No  FALLS:  Has patient fallen in last 6 months? Yes. 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[employer]; currently out on leave  PLOF: Independent and Leisure: fishing  PATIENT GOALS: improve  mobility and pain  NEXT MD VISIT: Not scheduled  OBJECTIVE:   PATIENT SURVEYS:  03/15/23: FOTO 24 (predicted 49)  COGNITION: Overall cognitive status: Within functional limits for tasks assessed     SENSATION: WFL  EDEMA:  03/15/23: Circumferential: Knee Joint Line: Rt: 34.5 cm/Lt: 39 cm   PALPATION: 03/15/23: very tender to light touch  LOWER EXTREMITY ROM:  ROM Left 03/15/23 Left 03/22/23  Knee flexion P: 42 A: 40 P: 69*  End of session  Knee extension P: -6 A: Unable    (Blank rows = not tested)  LOWER EXTREMITY MMT:    03/15/23: Not formally tested but grossly 2/5 Lt knee  MMT Right eval Left eval  Knee flexion    Knee extension     (Blank rows = not tested)  FUNCTIONAL TESTS:  03/15/23:  increased difficulty rising from a chair; decreased gait velocity  GAIT: 03/15/23 Distance walked: 100' Assistive device utilized: Environmental consultant - 2 wheeled Level of assistance: SBA Comments: antalgic gait, decreased speed   TODAY'S TREATMENT:  DATE:  03/22/2023: Manual: Seated Lt knee flexion mobilization c movement IR/distraction, contract/relax techniques for quad inhibition and long duration stretching.    Therex: Nustep seat 9 Lvl 5 8 mins with flexion stretch & quad set /leg press Supine Heel slide with BLEs on red therapy ball with strap LLE knee flexion & leg press ext 15 min.  Seated LAQ & active knee flexion with opposite LE opposing motion for 2 min. Seated rolling stool flexion / extension with mat table elevated 1 min.  Bridge 5 sec hold 3 reps then move LE closer for more knee flexion 3 sets Supine with femur vertical active knee flexion with PT gaurding 10 sec hold 10 reps.     Therapeutic Activities: PT instructed in weight shift upon arising 5 reps prior to walking. Pt return demo understanding.  Pre-gait stepping over 2" step with knee flexion / swing & stance knee ext weight shift 2 min.  PT  recommended elevation >/= 15 min >/= 2x/day with ice for edema. PT also recommended exercising every 30 min working her way thru HEP. Pt verbalized understanding.    Vaso left knee medium compression 10 min 34* with elevation & active muscle activity alphabet with foot.    03/18/2023 Manual: Seated Lt knee flexion mobilization c movement IR/distraction, contract/relax techniques for quad inhibition and long duration stretching.   Pt performed self STM with tennis ball on quad throughout manual treatment.   Therex: Nustep Lvl 5 8 mins - ROM for 50 seconds and 10 second stretch in flexion each minute  Incline gastroc stretch standing bilateral 30 sec x 5 Seated long duration gravity assisted knee flexion hang 2 mins Seated LAQ end range pause each direction 1-2 seconds x 10  Seated isometric extension/flexion alternating 5 sec each way x 12 each  Attempted sit to stand from elevated table but limited due to Lt knee flexion mobility/pain (attempted 2 times)   03/17/2023 Manual: Seated Lt knee flexion mobilization c movement IR/distraction, contract/relax techniques for quad inhibition and long duration stretching.   Therex: Seated isometric extension/flexion alternating 5 sec each way x 6 - x 2 Seated Lt leg quad set 5 sec hold x 10 Seated Lt knee flexion stretch c gravity only 2 mins (cues for home use) Incline gastroc stretch standing bilateral 30 sec x 3 Nustep Lvl 4 6 mins - ROM for 50 seconds and 10 second stretch in flexion each minute   PATIENT EDUCATION:  03/18/2023 Education details: HEP update Person educated: Patient Education method: Explanation, Demonstration, and Handouts Education comprehension: verbalized understanding, returned demonstration, and needs further education  HOME EXERCISE PROGRAM: Access Code: [email or web address] URL: [email or web address] Date: 03/18/2023 Prepared by: Chyrel Masson  Exercises - Quad Set (Mirrored)  - 5-10 x daily - 7 x weekly -  1 sets - 5-10 reps - 5 sec hold - Supine Heel Slide with Strap  - 5-10 x daily - 7 x weekly - 1 sets - 5-10 reps - Seated Heel Slide (Mirrored)  - 5-10 x daily - 7 x weekly - 1 sets - 5-10 reps - Seated Quad Set (Mirrored)  - 5-10 x daily - 7 x weekly - 1 sets - 5-10 reps - 5 sec hold - Seated Long Arc Quad (Mirrored)  - 3-5 x daily - 7 x weekly - 1-2 sets - 10 reps - 2 hold  ASSESSMENT:  CLINICAL IMPRESSION: Patient tolerated PT session with slow progression of low level activities.  She continues to guard limiting knee range. Patient  continues to benefit from skilled PT.   OBJECTIVE IMPAIRMENTS: Abnormal gait, decreased balance, decreased endurance, decreased mobility, difficulty walking, decreased ROM, decreased strength, hypomobility, increased edema, increased fascial restrictions, increased muscle spasms, and pain.   ACTIVITY LIMITATIONS: carrying, lifting, bending, sitting, standing, squatting, sleeping, stairs, transfers, bed mobility, toileting, dressing, and locomotion level  PARTICIPATION LIMITATIONS: meal prep, cleaning, laundry, driving, shopping, community activity, and occupation  PERSONAL FACTORS: 3+ comorbidities: OA, DM, HLD, HTN, MI s/p stent  are also affecting patient's functional outcome.   REHAB POTENTIAL: Fair elevated pain, limited ROM and tolerance to activity  CLINICAL DECISION MAKING: Evolving/moderate complexity  EVALUATION COMPLEXITY: Moderate   GOALS: Goals reviewed with patient? Yes  SHORT TERM GOALS: Target date: 04/12/2023  Independent with initial HEP Goal status: on going 03/17/2023  2.  Lt knee AROM 5-60 deg for improved function Goal status: on going 03/17/2023  LONG TERM GOALS: Target date: 05/10/2023  Independent with final HEP Goal status: INITIAL  2.  FOTO score improved to 49 Goal status: INITIAL  3.  Lt knee AROM improved to 0-90 for improved mobility Goal status: INIITAL  4.  Report pain < 5/10 with standing and walking for  improved function Goal status: INITIAL  5.  Demonstrate ability to amb with LRAD modified independent for improved mobility Goal status: INITIAL  6.  Perform work simulated tasks for improved function  Goal status: INITIAL     PLAN:  PT FREQUENCY:  2-3x/wk  PT DURATION: 8 weeks  PLANNED INTERVENTIONS: Therapeutic exercises, Therapeutic activity, Neuromuscular re-education, Balance training, Gait training, Patient/Family education, Self Care, Joint mobilization, Stair training, DME instructions, Aquatic Therapy, Dry Needling, Electrical stimulation, Cryotherapy, Moist heat, Taping, Vasopneumatic device, Manual therapy, and Re-evaluation  PLAN FOR NEXT SESSION: check ROM, Manual and therex for mobility gains to tolerance continued.  Strengthening in available range. Vaso to end.   Vladimir Faster, PT 03/22/2023, 10:55 AM

## 2023-03-24 ENCOUNTER — Ambulatory Visit (INDEPENDENT_AMBULATORY_CARE_PROVIDER_SITE_OTHER): Payer: BC Managed Care – PPO | Admitting: Physical Therapy

## 2023-03-24 ENCOUNTER — Encounter: Payer: Self-pay | Admitting: Physical Therapy

## 2023-03-24 DIAGNOSIS — R2681 Unsteadiness on feet: Secondary | ICD-10-CM

## 2023-03-24 DIAGNOSIS — M6281 Muscle weakness (generalized): Secondary | ICD-10-CM | POA: Diagnosis not present

## 2023-03-24 DIAGNOSIS — M25662 Stiffness of left knee, not elsewhere classified: Secondary | ICD-10-CM

## 2023-03-24 DIAGNOSIS — R600 Localized edema: Secondary | ICD-10-CM

## 2023-03-24 DIAGNOSIS — M25562 Pain in left knee: Secondary | ICD-10-CM

## 2023-03-24 DIAGNOSIS — R2689 Other abnormalities of gait and mobility: Secondary | ICD-10-CM

## 2023-03-24 NOTE — Therapy (Signed)
OUTPATIENT PHYSICAL THERAPY TREATMENT   Patient Name: MANAHIL VANZILE MRN: [id number] DOB:May 12, 1961, 62 y.o., female Today's Date: 03/24/2023  END OF SESSION:  PT End of Session - 03/24/23 0851     Visit Number 5    Number of Visits 16    Date for PT Re-Evaluation 05/10/23    Authorization Type [employer] State $72 copay    PT Start Time 0845    PT Stop Time 0940    PT Time Calculation (min) 55 min    Activity Tolerance Patient limited by pain    Behavior During Therapy [facility] for tasks assessed/performed                Past Medical History:  Diagnosis Date   Arthritis    Campylobacter enteritis May 2012   [facility] admission   Carpal tunnel syndrome, bilateral    Diabetes mellitus    Gastritis with bleeding due to alcohol 2010   [facility] admission   GERD (gastroesophageal reflux disease)    Hyperlipidemia    Hypertension    no meds   Menopause    Myocardial infarction    Neck pain    Wears glasses    Past Surgical History:  Procedure Laterality Date   CARPAL TUNNEL RELEASE Left 05/14/2014   Procedure: LEFT ULNAR NEUROPLASTY AT ELBOW AND ENDOSCOPIC CARPAL TUNNEL RELEASE;  Surgeon: Jodi Marble, MD;  Location: [facility];  Service: Orthopedics;  Laterality: Left;   CORONARY STENT INTERVENTION N/A 10/09/2019   Procedure: CORONARY STENT INTERVENTION;  Surgeon: Yates Decamp, MD;  Location: [facility];  Service: Cardiovascular;  Laterality: N/A;   CYST EXCISION  1995   under arms   endometrial biopsy  2004   benign   FOOT OSTEOTOMY  2010   right-spurs   LEFT HEART CATH AND CORONARY ANGIOGRAPHY N/A 10/09/2019   Procedure: LEFT HEART CATH AND CORONARY ANGIOGRAPHY;  Surgeon: Yates Decamp, MD;  Location: [facility];  Service: Cardiovascular;  Laterality: N/A;   TONSILLECTOMY     TOTAL KNEE ARTHROPLASTY Left 02/26/2023   Procedure: LEFT TOTAL KNEE ARTHROPLASTY;  Surgeon: Kathryne Hitch, MD;  Location: [facility];  Service: Orthopedics;  Laterality:  Left;  Needs RNFA   ULNAR NERVE TRANSPOSITION Left 05/14/2014   Procedure: ULNAR NERVE DECOMPRESSION/TRANSPOSITION;  Surgeon: Jodi Marble, MD;  Location:  SURGERY CENTER;  Service: Orthopedics;  Laterality: Left;   Patient Active Problem List   Diagnosis Date Noted   Status post left knee replacement 02/26/2023   Unilateral primary osteoarthritis, left knee 09/09/2022   Intractable nausea and vomiting 07/04/2022   Hematemesis 07/04/2022   Hypokalemia 07/04/2022   Lactic acidosis 07/04/2022   Pain due to onychomycosis of toenails of both feet 04/28/2022   Diabetic neuropathy, type II diabetes mellitus 04/28/2022   De Quervain's tenosynovitis, left 11/13/2021   Bilateral injections given May 21, 2020 degenerative arthritis of knee, bilateral 05/21/2020   Traumatic hematoma of female breast 11/29/2019   Dizziness 11/29/2019   Hospital discharge follow-up 10/23/2019   NSTEMI (non-ST elevated myocardial infarction) 10/10/2019   CAD (coronary artery disease) 10/10/2019   Chest pain 10/09/2019   Elevated troponin 10/09/2019   Uncontrolled type 2 diabetes mellitus with hyperglycemia 10/09/2019   Obesity (BMI 30.0-34.9) 10/09/2019   Cervical spondylitis with radiculitis 08/29/2019   Acute bursitis of left shoulder 01/30/2019   Chronic midline posterior neck pain 06/28/2018   Urgency incontinence 06/28/2018   Degenerative joint disease of knee, left 04/26/2018  Carpal tunnel syndrome on both sides 03/29/2018   Anemia 01/02/2018   Class 1 obesity with serious comorbidity and body mass index (BMI) of 32.0 to 32.9 in adult 07/23/2017   Left hip pain 05/04/2017   Carpal tunnel syndrome on right 12/27/2016   Polyarthralgia 11/09/2016   Uncontrolled type 2 diabetes mellitus with retinopathy, with long-term current use of insulin 07/07/2016   Visit for preventive health examination 06/21/2016   Increased urinary frequency 06/21/2016   Chronic meniscal tear of knee 08/19/2015    Left knee pain 06/19/2015   Candidiasis of skin 06/18/2015   Genital herpes 07/31/2014   Ulnar nerve compression 09/21/2013   Noncompliance with diet and medication regimen 11/15/2012   Boil, axilla 08/14/2012   Arthritis    Screening for breast cancer 08/04/2011   Screening for colon cancer 08/04/2011   HIDRADENITIS SUPPURATIVA 04/11/2007   GERD 11/17/2006   ENDOMETRIAL POLYP 11/17/2006   LOW BACK PAIN 11/17/2006   FIBROIDS, UTERUS 11/16/2006   Hyperlipidemia associated with type 2 diabetes mellitus 11/16/2006   Essential hypertension 11/16/2006    PCP: Georganna Skeans, MD  REFERRING PROVIDER: Kathryne Hitch, MD  REFERRING DIAG: [phone] (ICD-10-CM) - Status post left knee replacement   THERAPY DIAG:  Acute pain of left knee  Muscle weakness (generalized)  Stiffness of left knee, not elsewhere classified  Other abnormalities of gait and mobility  Unsteadiness on feet  Localized edema  Rationale for Evaluation and Treatment: Rehabilitation  ONSET DATE: 02/26/23  SUBJECTIVE:   SUBJECTIVE STATEMENT: She has been trying to do her exercises but not every 30 min.      PERTINENT HISTORY: OA, DM, HLD, HTN, MI s/p stent  PAIN:  NPRS scale: this morning 6/10 since last PT lowest 4/10 & 8/10 Pain location: Lt knee Pain description: throbbing, nauseating Aggravating factors: constant, pressure, end range movements Relieving factors: repositioning, medications  PRECAUTIONS: Fall  WEIGHT BEARING RESTRICTIONS: No  FALLS:  Has patient fallen in last 6 months? Yes. 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[employer]; currently out on leave  PLOF: Independent and Leisure: fishing  PATIENT GOALS: improve  mobility and pain  NEXT MD VISIT: Not scheduled  OBJECTIVE:   PATIENT SURVEYS:  03/15/23: FOTO 24 (predicted 49)  COGNITION: Overall cognitive status: Within functional limits for tasks assessed     SENSATION: WFL  EDEMA:  03/15/23: Circumferential: Knee Joint Line: Rt: 34.5 cm/Lt: 39 cm   PALPATION: 03/15/23: very tender to light touch  LOWER EXTREMITY ROM:  ROM Left 03/15/23 Left 03/22/23 Left 03/24/23  Knee flexion P: 42 A: 40 P: 69*  End of session P:  78* After manual  Knee extension P: -6 A: Unable  supine P: -5*    (Blank rows = not tested)  LOWER EXTREMITY MMT:    03/15/23: Not formally tested but grossly 2/5 Lt knee  MMT Right eval Left eval  Knee flexion    Knee extension     (Blank rows = not tested)  FUNCTIONAL TESTS:  03/15/23:  increased difficulty rising from a chair; decreased gait velocity  GAIT: 03/15/23 Distance walked: 100' Assistive device utilized: Environmental consultant - 2 wheeled Level of assistance: SBA Comments: antalgic gait, decreased speed   TODAY'S TREATMENT:  DATE:  03/24/2023: Manual: Seated Lt knee flexion mobilization c movement IR/distraction, contract/relax techniques for quad inhibition and long duration stretching.  Supine knee ext long duration stretch. PT performed scar mob same direction movement.   Therex: Nustep seat 9 Lvl 5 8 mins with flexion stretch & quad set /leg press Supine Heel slide with LLEs on ball with strap 2 min.  Bridge 5 sec hold 3 reps then move LE closer for more knee flexion 3 sets Supine with femur vertical active knee flexion 10 sec hold 20 reps.   Neuromuscular Re-education: Tandem stance on foam beam 30 sec 2 reps with LLE in front & in back with intermittent touch.  Therapeutic Activities: PT demo & verbal cues on sit / stand using BLEs with proper weight shift & technique.  Pt performed 5 reps with PT cueing.  Pt amb inside //bars without  device 3 laps.  Pt amb with RW with cues for step through pattern with swing knee flexion & ext in stance.   Vaso left knee medium compression 10 min 34* with elevation & active muscle activity alphabet with foot.   03/22/2023: Manual: Seated Lt knee flexion mobilization c movement IR/distraction, contract/relax techniques for quad inhibition and long duration stretching.    Therex: Nustep seat 9 Lvl 5 8 mins with flexion stretch & quad set /leg press Supine Heel slide with BLEs on red therapy ball with strap LLE knee flexion & leg press ext 15 reps.  Seated LAQ & active knee flexion with opposite LE opposing motion for 2 min. Seated rolling stool flexion / extension with mat table elevated 1 min.  Bridge 5 sec hold 3 reps then move LE closer for more knee flexion 3 sets Supine with femur vertical active knee flexion with PT gaurding 10 sec hold 10 reps.     Therapeutic Activities: PT instructed in weight shift upon arising 5 reps prior to walking. Pt return demo understanding.  Pre-gait stepping over 2" step with knee flexion / swing & stance knee ext weight shift 2 min.  PT recommended elevation >/= 15 min >/= 2x/day with ice for edema. PT also recommended exercising every 30 min working her way thru HEP. Pt verbalized understanding.    Vaso left knee medium compression 10 min 34* with elevation & active muscle activity alphabet with foot.    03/18/2023 Manual: Seated Lt knee flexion mobilization c movement IR/distraction, contract/relax techniques for quad inhibition and long duration stretching.   Pt performed self STM with tennis ball on quad throughout manual treatment.   Therex: Nustep Lvl 5 8 mins - ROM for 50 seconds and 10 second stretch in flexion each minute  Incline gastroc stretch standing bilateral 30 sec x 5 Seated long duration gravity assisted knee flexion hang 2 mins Seated LAQ end range pause each direction 1-2 seconds x 10  Seated isometric extension/flexion  alternating 5 sec each way x 12 each  Attempted sit to stand from elevated table but limited due to Lt knee flexion mobility/pain (attempted 2 times)    HOME EXERCISE PROGRAM: Access Code: [email or web address] URL: [email or web address] Date: 03/18/2023 Prepared by: Chyrel Masson  Exercises - Quad Set (Mirrored)  - 5-10 x daily - 7 x weekly - 1 sets - 5-10 reps - 5 sec hold - Supine Heel Slide with Strap  - 5-10 x daily - 7 x weekly - 1 sets - 5-10 reps - Seated Heel Slide (Mirrored)  - 5-10 x daily - 7 x weekly - 1 sets -  5-10 reps - Seated Quad Set (Mirrored)  - 5-10 x daily - 7 x weekly - 1 sets - 5-10 reps - 5 sec hold - Seated Long Arc Quad (Mirrored)  - 3-5 x daily - 7 x weekly - 1-2 sets - 10 reps - 2 hold  ASSESSMENT: CLINICAL IMPRESSION: Patient improved passive range but requires progressive exercises & manual therapy.  She had less anxiety with PT getting mind off manual therapy to limit her guarding. Patient has happier expressions & less yelling during activities. Patient continues to benefit from skilled PT.   OBJECTIVE IMPAIRMENTS: Abnormal gait, decreased balance, decreased endurance, decreased mobility, difficulty walking, decreased ROM, decreased strength, hypomobility, increased edema, increased fascial restrictions, increased muscle spasms, and pain.   ACTIVITY LIMITATIONS: carrying, lifting, bending, sitting, standing, squatting, sleeping, stairs, transfers, bed mobility, toileting, dressing, and locomotion level  PARTICIPATION LIMITATIONS: meal prep, cleaning, laundry, driving, shopping, community activity, and occupation  PERSONAL FACTORS: 3+ comorbidities: OA, DM, HLD, HTN, MI s/p stent  are also affecting patient's functional outcome.   REHAB POTENTIAL: Fair elevated pain, limited ROM and tolerance to activity  CLINICAL DECISION MAKING: Evolving/moderate complexity  EVALUATION COMPLEXITY: Moderate   GOALS: Goals reviewed with patient? Yes  SHORT  TERM GOALS: Target date: 04/12/2023  Independent with initial HEP Goal status: on going 03/17/2023  2.  Lt knee AROM 5-60 deg for improved function Goal status: on going 03/17/2023  LONG TERM GOALS: Target date: 05/10/2023  Independent with final HEP Goal status: ongoing 03/24/2023  2.  FOTO score improved to 49 Goal status: ongoing 03/24/2023  3.  Lt knee AROM improved to 0-90 for improved mobility Goal status: ongoing 03/24/2023  4.  Report pain < 5/10 with standing and walking for improved function Goal status: ongoing 03/24/2023  5.  Demonstrate ability to amb with LRAD modified independent for improved mobility Goal status: ongoing 03/24/2023  6.  Perform work simulated tasks for improved function  Goal status: ongoing 03/24/2023     PLAN:  PT FREQUENCY:  2-3x/wk  PT DURATION: 8 weeks  PLANNED INTERVENTIONS: Therapeutic exercises, Therapeutic activity, Neuromuscular re-education, Balance training, Gait training, Patient/Family education, Self Care, Joint mobilization, Stair training, DME instructions, Aquatic Therapy, Dry Needling, Electrical stimulation, Cryotherapy, Moist heat, Taping, Vasopneumatic device, Manual therapy, and Re-evaluation  PLAN FOR NEXT SESSION:  Manual and therex for mobility gains to tolerance continued. Progress exercises Strengthening in available range. Updated HEP prn. Vaso to end.   Vladimir Faster, PT, DPT 03/24/2023, 9:47 AM

## 2023-03-26 ENCOUNTER — Ambulatory Visit (INDEPENDENT_AMBULATORY_CARE_PROVIDER_SITE_OTHER): Payer: BC Managed Care – PPO | Admitting: Rehabilitative and Restorative Service Providers"

## 2023-03-26 ENCOUNTER — Encounter: Payer: Self-pay | Admitting: Rehabilitative and Restorative Service Providers"

## 2023-03-26 DIAGNOSIS — R600 Localized edema: Secondary | ICD-10-CM

## 2023-03-26 DIAGNOSIS — M6281 Muscle weakness (generalized): Secondary | ICD-10-CM

## 2023-03-26 DIAGNOSIS — R2689 Other abnormalities of gait and mobility: Secondary | ICD-10-CM

## 2023-03-26 DIAGNOSIS — M25662 Stiffness of left knee, not elsewhere classified: Secondary | ICD-10-CM | POA: Diagnosis not present

## 2023-03-26 DIAGNOSIS — R2681 Unsteadiness on feet: Secondary | ICD-10-CM

## 2023-03-26 DIAGNOSIS — M25562 Pain in left knee: Secondary | ICD-10-CM | POA: Diagnosis not present

## 2023-03-26 NOTE — Therapy (Signed)
OUTPATIENT PHYSICAL THERAPY TREATMENT   Patient Name: [name] MRN: [id number] DOB:[birth year], 62 y.o., female Today's Date: 03/26/2023  END OF SESSION:  PT End of Session - 03/26/23 0849     Visit Number 6    Number of Visits 16    Date for PT Re-Evaluation 05/10/23    Authorization Type [employer] State $72 copay    PT Start Time 0837    PT Stop Time 0927    PT Time Calculation (min) 50 min    Activity Tolerance Patient limited by pain    Behavior During Therapy [facility] for tasks assessed/performed              Past Medical History:  Diagnosis Date   Arthritis    Campylobacter enteritis May 2012   [facility] admission   Carpal tunnel syndrome, bilateral    Diabetes mellitus    Gastritis with bleeding due to alcohol 2010   [facility] admission   GERD (gastroesophageal reflux disease)    Hyperlipidemia    Hypertension    no meds   Menopause    Myocardial infarction (HCC)    Neck pain    Wears glasses    Past Surgical History:  Procedure Laterality Date   CARPAL TUNNEL RELEASE Left 05/14/2014   Procedure: LEFT ULNAR NEUROPLASTY AT ELBOW AND ENDOSCOPIC CARPAL TUNNEL RELEASE;  Surgeon: Jodi Marble, MD;  Location: [facility];  Service: Orthopedics;  Laterality: Left;   CORONARY STENT INTERVENTION N/A 10/09/2019   Procedure: CORONARY STENT INTERVENTION;  Surgeon: Yates Decamp, MD;  Location: [facility];  Service: Cardiovascular;  Laterality: N/A;   CYST EXCISION  1995   under arms   endometrial biopsy  2004   benign   FOOT OSTEOTOMY  2010   right-spurs   LEFT HEART CATH AND CORONARY ANGIOGRAPHY N/A 10/09/2019   Procedure: LEFT HEART CATH AND CORONARY ANGIOGRAPHY;  Surgeon: Yates Decamp, MD;  Location: [facility];  Service: Cardiovascular;  Laterality: N/A;   TONSILLECTOMY     TOTAL KNEE ARTHROPLASTY Left 02/26/2023   Procedure: LEFT TOTAL KNEE ARTHROPLASTY;  Surgeon: Kathryne Hitch, MD;  Location: [facility];  Service: Orthopedics;  Laterality:  Left;  Needs RNFA   ULNAR NERVE TRANSPOSITION Left 05/14/2014   Procedure: ULNAR NERVE DECOMPRESSION/TRANSPOSITION;  Surgeon: Jodi Marble, MD;  Location:  SURGERY CENTER;  Service: Orthopedics;  Laterality: Left;   Patient Active Problem List   Diagnosis Date Noted   Status post left knee replacement 02/26/2023   Unilateral primary osteoarthritis, left knee 09/09/2022   Intractable nausea and vomiting 07/04/2022   Hematemesis 07/04/2022   Hypokalemia 07/04/2022   Lactic acidosis 07/04/2022   Pain due to onychomycosis of toenails of both feet 04/28/2022   Diabetic neuropathy, type II diabetes mellitus (HCC) 04/28/2022   De Quervain's tenosynovitis, left 11/13/2021   Bilateral injections given May 21, 2020 degenerative arthritis of knee, bilateral 05/21/2020   Traumatic hematoma of female breast 11/29/2019   Dizziness 11/29/2019   Hospital discharge follow-up 10/23/2019   NSTEMI (non-ST elevated myocardial infarction) (HCC) 10/10/2019   CAD (coronary artery disease) 10/10/2019   Chest pain 10/09/2019   Elevated troponin 10/09/2019   Uncontrolled type 2 diabetes mellitus with hyperglycemia (HCC) 10/09/2019   Obesity (BMI 30.0-34.9) 10/09/2019   Cervical spondylitis with radiculitis (HCC) 08/29/2019   Acute bursitis of left shoulder 01/30/2019   Chronic midline posterior neck pain 06/28/2018   Urgency incontinence 06/28/2018   Degenerative joint disease of knee,  left 04/26/2018   Carpal tunnel syndrome on both sides 03/29/2018   Anemia 01/02/2018   Class 1 obesity with serious comorbidity and body mass index (BMI) of 32.0 to 32.9 in adult 07/23/2017   Left hip pain 05/04/2017   Carpal tunnel syndrome on right 12/27/2016   Polyarthralgia 11/09/2016   Uncontrolled type 2 diabetes mellitus with retinopathy, with long-term current use of insulin 07/07/2016   Visit for preventive health examination 06/21/2016   Increased urinary frequency 06/21/2016   Chronic meniscal  tear of knee 08/19/2015   Left knee pain 06/19/2015   Candidiasis of skin 06/18/2015   Genital herpes 07/31/2014   Ulnar nerve compression 09/21/2013   Noncompliance with diet and medication regimen 11/15/2012   Boil, axilla 08/14/2012   Arthritis    Screening for breast cancer 08/04/2011   Screening for colon cancer 08/04/2011   HIDRADENITIS SUPPURATIVA 04/11/2007   GERD 11/17/2006   ENDOMETRIAL POLYP 11/17/2006   LOW BACK PAIN 11/17/2006   FIBROIDS, UTERUS 11/16/2006   Hyperlipidemia associated with type 2 diabetes mellitus (HCC) 11/16/2006   Essential hypertension 11/16/2006    PCP: Georganna Skeans, MD  REFERRING PROVIDER: Kathryne Hitch, MD  REFERRING DIAG: [phone] (ICD-10-CM) - Status post left knee replacement   THERAPY DIAG:  Acute pain of left knee  Muscle weakness (generalized)  Stiffness of left knee, not elsewhere classified  Other abnormalities of gait and mobility  Unsteadiness on feet  Localized edema  Rationale for Evaluation and Treatment: Rehabilitation  ONSET DATE: 02/26/23  SUBJECTIVE:   SUBJECTIVE STATEMENT: She reported having tightness/cramping in thigh on front and side throughout the day.      PERTINENT HISTORY: OA, DM, HLD, HTN, MI s/p stent  PAIN:  NPRS scale: 6/10 Pain location: Lt knee Pain description: throbbing, nauseating Aggravating factors: constant, pressure, end range movements Relieving factors: repositioning, medications  PRECAUTIONS: Fall  WEIGHT BEARING RESTRICTIONS: No  FALLS:  Has patient fallen in last 6 months? Yes. 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[employer]; currently out on leave  PLOF: Independent and Leisure: fishing  PATIENT GOALS:  improve mobility and pain  NEXT MD VISIT: Not scheduled  OBJECTIVE:   PATIENT SURVEYS:  03/15/23: FOTO 24 (predicted 49)  COGNITION: Overall cognitive status: Within functional limits for tasks assessed     SENSATION: WFL  EDEMA:  03/15/23: Circumferential: Knee Joint Line: Rt: 34.5 cm/Lt: 39 cm   PALPATION: 03/15/23: very tender to light touch  LOWER EXTREMITY ROM:  ROM Left 03/15/23 Left 03/22/23 Left 03/24/23  Knee flexion P: 42 A: 40 P: 69*  End of session P:  78* After manual  Knee extension P: -6 A: Unable  supine P: -5*    (Blank rows = not tested)  LOWER EXTREMITY MMT:    03/15/23: Not formally tested but grossly 2/5 Lt knee  MMT Right eval Left eval  Knee flexion    Knee extension     (Blank rows = not tested)  FUNCTIONAL TESTS:  03/15/23:  increased difficulty rising from a chair; decreased gait velocity  GAIT: 03/15/23 Distance walked: 100' Assistive device utilized: Environmental consultant - 2 wheeled Level of assistance: SBA Comments: antalgic gait, decreased speed   TODAY'S TREATMENT:  DATE:03/26/2023 Manual: Seated Lt knee flexion mobilization c movement IR/distraction, contract/relax techniques for quad inhibition and long duration stretching.    Therex: Nustep Lvl 5 8.5 mins UE/LE seat 9 Incline gastroc stretch standing bilateral 30 sec x 3 Leg press double leg 50 lbs 2 x 10, Lt single leg 25 lbs 2 x 10 in available range Seated LAQ end range pause each direction 1-2 seconds 2x10 2.5 lbs, x10 2.5 post manual Seated isometric extension/flexion alternating 5 sec each way x 15 each  Vasopneumatic  left knee medium compression 10 min 34* with elevation & active muscle activity alphabet with foot.   TODAY'S TREATMENT:                                                         DATE: 03/24/2023: Manual: Seated Lt knee flexion mobilization c movement IR/distraction, contract/relax techniques for quad  inhibition and long duration stretching.  Supine knee ext long duration stretch. PT performed scar mob same direction movement.   Therex: Nustep seat 9 Lvl 5 8 mins with flexion stretch & quad set /leg press Supine Heel slide with LLEs on ball with strap 2 min.  Bridge 5 sec hold 3 reps then move LE closer for more knee flexion 3 sets Supine with femur vertical active knee flexion 10 sec hold 20 reps.   Neuromuscular Re-education: Tandem stance on foam beam 30 sec 2 reps with LLE in front & in back with intermittent touch.  Therapeutic Activities: PT demo & verbal cues on sit / stand using BLEs with proper weight shift & technique.  Pt performed 5 reps with PT cueing.  Pt amb inside //bars without device 3 laps.  Pt amb with RW with cues for step through pattern with swing knee flexion & ext in stance.   Vaso left knee medium compression 10 min 34* with elevation & active muscle activity alphabet with foot.   TODAY'S TREATMENT:                                                         DATE:03/22/2023: Manual: Seated Lt knee flexion mobilization c movement IR/distraction, contract/relax techniques for quad inhibition and long duration stretching.    Therex: Nustep seat 9 Lvl 5 8 mins with flexion stretch & quad set /leg press Supine Heel slide with BLEs on red therapy ball with strap LLE knee flexion & leg press ext 15 reps.  Seated LAQ & active knee flexion with opposite LE opposing motion for 2 min. Seated rolling stool flexion / extension with mat table elevated 1 min.  Bridge 5 sec hold 3 reps then move LE closer for more knee flexion 3 sets Supine with femur vertical active knee flexion with PT gaurding 10 sec hold 10 reps.     Therapeutic Activities: PT instructed in weight shift upon arising 5 reps prior to walking. Pt return demo understanding.  Pre-gait stepping over 2" step with knee flexion / swing & stance knee ext weight shift 2 min.  PT recommended elevation >/= 15  min >/= 2x/day with ice for edema. PT also recommended exercising every 30 min working her way thru  HEP. Pt verbalized understanding.    Vaso left knee medium compression 10 min 34* with elevation & active muscle activity alphabet with foot.    TODAY'S TREATMENT:                                                         DATE:03/18/2023 Manual: Seated Lt knee flexion mobilization c movement IR/distraction, contract/relax techniques for quad inhibition and long duration stretching.   Pt performed self STM with tennis ball on quad throughout manual treatment.   Therex: Nustep Lvl 5 8 mins - ROM for 50 seconds and 10 second stretch in flexion each minute  Incline gastroc stretch standing bilateral 30 sec x 5 Seated long duration gravity assisted knee flexion hang 2 mins Seated LAQ end range pause each direction 1-2 seconds x 10  Seated isometric extension/flexion alternating 5 sec each way x 12 each  Attempted sit to stand from elevated table but limited due to Lt knee flexion mobility/pain (attempted 2 times)    HOME EXERCISE PROGRAM: Access Code: [email or web address] URL: [email or web address] Date: 03/18/2023 Prepared by: Chyrel Masson  Exercises - Quad Set (Mirrored)  - 5-10 x daily - 7 x weekly - 1 sets - 5-10 reps - 5 sec hold - Supine Heel Slide with Strap  - 5-10 x daily - 7 x weekly - 1 sets - 5-10 reps - Seated Heel Slide (Mirrored)  - 5-10 x daily - 7 x weekly - 1 sets - 5-10 reps - Seated Quad Set (Mirrored)  - 5-10 x daily - 7 x weekly - 1 sets - 5-10 reps - 5 sec hold - Seated Long Arc Quad (Mirrored)  - 3-5 x daily - 7 x weekly - 1-2 sets - 10 reps - 2 hold  ASSESSMENT: CLINICAL IMPRESSION: Introduced early WB loading strengthening c fair tolerance.  Pt to benefit from continued gains in mobility and strength to improve functional ambulation and movements in day.  Slow but steady progress in mobility noted at this time.  Continued skilled PT services indicated.    OBJECTIVE IMPAIRMENTS: Abnormal gait, decreased balance, decreased endurance, decreased mobility, difficulty walking, decreased ROM, decreased strength, hypomobility, increased edema, increased fascial restrictions, increased muscle spasms, and pain.   ACTIVITY LIMITATIONS: carrying, lifting, bending, sitting, standing, squatting, sleeping, stairs, transfers, bed mobility, toileting, dressing, and locomotion level  PARTICIPATION LIMITATIONS: meal prep, cleaning, laundry, driving, shopping, community activity, and occupation  PERSONAL FACTORS: 3+ comorbidities: OA, DM, HLD, HTN, MI s/p stent  are also affecting patient's functional outcome.   REHAB POTENTIAL: Fair elevated pain, limited ROM and tolerance to activity  CLINICAL DECISION MAKING: Evolving/moderate complexity  EVALUATION COMPLEXITY: Moderate   GOALS: Goals reviewed with patient? Yes  SHORT TERM GOALS: Target date: 04/12/2023  Independent with initial HEP Goal status: on going 03/17/2023  2.  Lt knee AROM 5-60 deg for improved function Goal status: on going 03/17/2023  LONG TERM GOALS: Target date: 05/10/2023  Independent with final HEP Goal status: ongoing 03/24/2023  2.  FOTO score improved to 49 Goal status: ongoing 03/24/2023  3.  Lt knee AROM improved to 0-90 for improved mobility Goal status: ongoing 03/24/2023  4.  Report pain < 5/10 with standing and walking for improved function Goal status: ongoing 03/24/2023  5.  Demonstrate ability to amb with LRAD modified independent for  improved mobility Goal status: ongoing 03/24/2023  6.  Perform work simulated tasks for improved function  Goal status: ongoing 03/24/2023     PLAN:  PT FREQUENCY:  2-3x/wk  PT DURATION: 8 weeks  PLANNED INTERVENTIONS: Therapeutic exercises, Therapeutic activity, Neuromuscular re-education, Balance training, Gait training, Patient/Family education, Self Care, Joint mobilization, Stair training, DME instructions, Aquatic  Therapy, Dry Needling, Electrical stimulation, Cryotherapy, Moist heat, Taping, Vasopneumatic device, Manual therapy, and Re-evaluation  PLAN FOR NEXT SESSION:  Manual and therex for mobility gains to tolerance continued. Progress exercises Strengthening in available range.    Chyrel Masson, PT, DPT, OCS, ATC 03/26/23  9:23 AM

## 2023-03-29 ENCOUNTER — Ambulatory Visit (INDEPENDENT_AMBULATORY_CARE_PROVIDER_SITE_OTHER): Payer: BC Managed Care – PPO | Admitting: Rehabilitative and Restorative Service Providers"

## 2023-03-29 ENCOUNTER — Encounter: Payer: Self-pay | Admitting: Rehabilitative and Restorative Service Providers"

## 2023-03-29 DIAGNOSIS — M25562 Pain in left knee: Secondary | ICD-10-CM

## 2023-03-29 DIAGNOSIS — M25662 Stiffness of left knee, not elsewhere classified: Secondary | ICD-10-CM | POA: Diagnosis not present

## 2023-03-29 DIAGNOSIS — R2681 Unsteadiness on feet: Secondary | ICD-10-CM

## 2023-03-29 DIAGNOSIS — M6281 Muscle weakness (generalized): Secondary | ICD-10-CM

## 2023-03-29 DIAGNOSIS — R600 Localized edema: Secondary | ICD-10-CM

## 2023-03-29 DIAGNOSIS — R2689 Other abnormalities of gait and mobility: Secondary | ICD-10-CM

## 2023-03-29 NOTE — Therapy (Signed)
OUTPATIENT PHYSICAL THERAPY TREATMENT   Patient Name: [name] MRN: [id number] DOB:03/09/1961, 62 y.o., female Today's Date: 03/29/2023  END OF SESSION:  PT End of Session - 03/29/23 0827     Visit Number 7    Number of Visits 16    Date for PT Re-Evaluation 05/10/23    Authorization Type [employer] State $72 copay    PT Start Time 0830    PT Stop Time 0920    PT Time Calculation (min) 50 min    Activity Tolerance Patient limited by pain    Behavior During Therapy [facility] for tasks assessed/performed               Past Medical History:  Diagnosis Date   Arthritis    Campylobacter enteritis May 2012   [facility] admission   Carpal tunnel syndrome, bilateral    Diabetes mellitus    Gastritis with bleeding due to alcohol 2010   [facility] admission   GERD (gastroesophageal reflux disease)    Hyperlipidemia    Hypertension    no meds   Menopause    Myocardial infarction (HCC)    Neck pain    Wears glasses    Past Surgical History:  Procedure Laterality Date   CARPAL TUNNEL RELEASE Left 05/14/2014   Procedure: LEFT ULNAR NEUROPLASTY AT ELBOW AND ENDOSCOPIC CARPAL TUNNEL RELEASE;  Surgeon: Jodi Marble, MD;  Location: [facility];  Service: Orthopedics;  Laterality: Left;   CORONARY STENT INTERVENTION N/A 10/09/2019   Procedure: CORONARY STENT INTERVENTION;  Surgeon: Yates Decamp, MD;  Location: [facility];  Service: Cardiovascular;  Laterality: N/A;   CYST EXCISION  1995   under arms   endometrial biopsy  2004   benign   FOOT OSTEOTOMY  2010   right-spurs   LEFT HEART CATH AND CORONARY ANGIOGRAPHY N/A 10/09/2019   Procedure: LEFT HEART CATH AND CORONARY ANGIOGRAPHY;  Surgeon: Yates Decamp, MD;  Location: [facility];  Service: Cardiovascular;  Laterality: N/A;   TONSILLECTOMY     TOTAL KNEE ARTHROPLASTY Left 02/26/2023   Procedure: LEFT TOTAL KNEE ARTHROPLASTY;  Surgeon: Kathryne Hitch, MD;  Location: [facility];  Service: Orthopedics;   Laterality: Left;  Needs RNFA   ULNAR NERVE TRANSPOSITION Left 05/14/2014   Procedure: ULNAR NERVE DECOMPRESSION/TRANSPOSITION;  Surgeon: Jodi Marble, MD;  Location:  SURGERY CENTER;  Service: Orthopedics;  Laterality: Left;   Patient Active Problem List   Diagnosis Date Noted   Status post left knee replacement 02/26/2023   Unilateral primary osteoarthritis, left knee 09/09/2022   Intractable nausea and vomiting 07/04/2022   Hematemesis 07/04/2022   Hypokalemia 07/04/2022   Lactic acidosis 07/04/2022   Pain due to onychomycosis of toenails of both feet 04/28/2022   Diabetic neuropathy, type II diabetes mellitus (HCC) 04/28/2022   De Quervain's tenosynovitis, left 11/13/2021   Bilateral injections given May 21, 2020 degenerative arthritis of knee, bilateral 05/21/2020   Traumatic hematoma of female breast 11/29/2019   Dizziness 11/29/2019   Hospital discharge follow-up 10/23/2019   NSTEMI (non-ST elevated myocardial infarction) (HCC) 10/10/2019   CAD (coronary artery disease) 10/10/2019   Chest pain 10/09/2019   Elevated troponin 10/09/2019   Uncontrolled type 2 diabetes mellitus with hyperglycemia (HCC) 10/09/2019   Obesity (BMI 30.0-34.9) 10/09/2019   Cervical spondylitis with radiculitis (HCC) 08/29/2019   Acute bursitis of left shoulder 01/30/2019   Chronic midline posterior neck pain 06/28/2018   Urgency incontinence 06/28/2018   Degenerative joint disease of  knee, left 04/26/2018   Carpal tunnel syndrome on both sides 03/29/2018   Anemia 01/02/2018   Class 1 obesity with serious comorbidity and body mass index (BMI) of 32.0 to 32.9 in adult 07/23/2017   Left hip pain 05/04/2017   Carpal tunnel syndrome on right 12/27/2016   Polyarthralgia 11/09/2016   Uncontrolled type 2 diabetes mellitus with retinopathy, with long-term current use of insulin 07/07/2016   Visit for preventive health examination 06/21/2016   Increased urinary frequency 06/21/2016   Chronic  meniscal tear of knee 08/19/2015   Left knee pain 06/19/2015   Candidiasis of skin 06/18/2015   Genital herpes 07/31/2014   Ulnar nerve compression 09/21/2013   Noncompliance with diet and medication regimen 11/15/2012   Boil, axilla 08/14/2012   Arthritis    Screening for breast cancer 08/04/2011   Screening for colon cancer 08/04/2011   HIDRADENITIS SUPPURATIVA 04/11/2007   GERD 11/17/2006   ENDOMETRIAL POLYP 11/17/2006   LOW BACK PAIN 11/17/2006   FIBROIDS, UTERUS 11/16/2006   Hyperlipidemia associated with type 2 diabetes mellitus (HCC) 11/16/2006   Essential hypertension 11/16/2006    PCP: Georganna Skeans, MD  REFERRING PROVIDER: Kathryne Hitch, MD  REFERRING DIAG: [phone] (ICD-10-CM) - Status post left knee replacement   THERAPY DIAG:  Acute pain of left knee  Muscle weakness (generalized)  Stiffness of left knee, not elsewhere classified  Other abnormalities of gait and mobility  Unsteadiness on feet  Localized edema  Rationale for Evaluation and Treatment: Rehabilitation  ONSET DATE: 02/26/23  SUBJECTIVE:   SUBJECTIVE STATEMENT: She indicated stiffness/tightness continued.  She reported throbbing in knee.     PERTINENT HISTORY: OA, DM, HLD, HTN, MI s/p stent  PAIN:  NPRS scale: 6/10 Pain location: Lt knee Pain description: throbbing Aggravating factors: constant, pressure, end range movements Relieving factors: repositioning, medications  PRECAUTIONS: Fall  WEIGHT BEARING RESTRICTIONS: No  FALLS:  Has patient fallen in last 6 months? Yes. 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[employer]; currently out on leave  PLOF: Independent and Leisure: fishing  PATIENT GOALS: improve mobility  and pain  NEXT MD VISIT: Not scheduled  OBJECTIVE:   PATIENT SURVEYS:  03/15/23: FOTO 24 (predicted 49)  COGNITION: Overall cognitive status: Within functional limits for tasks assessed     SENSATION: WFL  EDEMA:  03/15/23: Circumferential: Knee Joint Line: Rt: 34.5 cm/Lt: 39 cm   PALPATION: 03/15/23: very tender to light touch  LOWER EXTREMITY ROM:  ROM Left 03/15/23 Left 03/22/23 Left 03/24/23  Knee flexion P: 42 A: 40 P: 69*  End of session P:  78* After manual  Knee extension P: -6 A: Unable  supine P: -5*    (Blank rows = not tested)  LOWER EXTREMITY MMT:    03/15/23: Not formally tested but grossly 2/5 Lt knee  MMT Right  Left 03/29/2023  Knee flexion  4/5  Knee extension  3+/5 in available range   (Blank rows = not tested)  FUNCTIONAL TESTS:  03/15/23:  increased difficulty rising from a chair; decreased gait velocity  GAIT: 03/15/23 Distance walked: 100' Assistive device utilized: Environmental consultant - 2 wheeled Level of assistance: SBA Comments: antalgic gait, decreased speed   TODAY'S TREATMENT:  DATE:03/29/2023 Therex: UBE UE/LE AAROM partial revolutions to stretch 3-5 sec holds 10 mins Incline gastroc stretch standing bilateral 30 sec x 3 Leg press double leg 56 lbs x 15 c slow lowering focus, pause in flexion, Lt single leg 25 lbs 2 x 10 in available range c pause in flexion Seated Lt leg quad set 5 sec hold x 10  Seated SLR 2 x 5 (cues for home use) Seated LAQ x 5 Seated Rt knee overpressure to Lt flexion stretch 15 seconds x 8 (added to HEP)  Neuro Re-ed Pre gait 4 inch step over clear with contralateral fwd/retro step weight shift x 15 bilateral in // bars with light hand assist   Vasopneumatic  left knee medium compression 10 min 34* with elevation  TODAY'S TREATMENT:                                                         DATE:03/26/2023 Manual: Seated Lt knee flexion mobilization c movement  IR/distraction, contract/relax techniques for quad inhibition and long duration stretching.    Therex: Nustep Lvl 5 8.5 mins UE/LE seat 9 Incline gastroc stretch standing bilateral 30 sec x 3 Leg press double leg 50 lbs 2 x 10, Lt single leg 25 lbs 2 x 10 in available range Seated LAQ end range pause each direction 1-2 seconds 2x10 2.5 lbs, x10 2.5 post manual Seated isometric extension/flexion alternating 5 sec each way x 15 each  Vasopneumatic  left knee medium compression 10 min 34* with elevation & active muscle activity alphabet with foot.   TODAY'S TREATMENT:                                                         DATE: 03/24/2023: Manual: Seated Lt knee flexion mobilization c movement IR/distraction, contract/relax techniques for quad inhibition and long duration stretching.  Supine knee ext long duration stretch. PT performed scar mob same direction movement.   Therex: Nustep seat 9 Lvl 5 8 mins with flexion stretch & quad set /leg press Supine Heel slide with LLEs on ball with strap 2 min.  Bridge 5 sec hold 3 reps then move LE closer for more knee flexion 3 sets Supine with femur vertical active knee flexion 10 sec hold 20 reps.   Neuromuscular Re-education: Tandem stance on foam beam 30 sec 2 reps with LLE in front & in back with intermittent touch.  Therapeutic Activities: PT demo & verbal cues on sit / stand using BLEs with proper weight shift & technique.  Pt performed 5 reps with PT cueing.  Pt amb inside //bars without device 3 laps.  Pt amb with RW with cues for step through pattern with swing knee flexion & ext in stance.   Vaso left knee medium compression 10 min 34* with elevation & active muscle activity alphabet with foot.    HOME EXERCISE PROGRAM: Access Code: [email or web address] URL: [email or web address] Date: 03/29/2023 Prepared by: Chyrel Masson  Exercises - Quad Set (Mirrored)  - 5-10 x daily - 7 x weekly - 1 sets - 5-10 reps - 5 sec hold -  Supine Heel Slide with Strap  - 5-10 x  daily - 7 x weekly - 1 sets - 5-10 reps - Seated Heel Slide (Mirrored)  - 5-10 x daily - 7 x weekly - 1 sets - 5-10 reps - Seated Quad Set (Mirrored)  - 5-10 x daily - 7 x weekly - 1 sets - 5-10 reps - 5 sec hold - Seated Long Arc Quad (Mirrored)  - 3-5 x daily - 7 x weekly - 1-2 sets - 10 reps - 2 hold - Seated Knee Flexion Extension AAROM with Overpressure (Mirrored)  - 5-6 x daily - 7 x weekly - 1 sets - 5 reps - 10 hold - Seated Straight Leg Heel Taps  - 1-2 x daily - 7 x weekly - 3 sets - 5-10 reps  ASSESSMENT: CLINICAL IMPRESSION: Continued emphasis on routine flexion and extension mobility HEP to promote improved motion.  Able to tolerate introduction to strengthening with fair tolerance with fatigue noted.  Additional SLR seated and overpressure stretch for Lt knee flexion added to HEP.  Continued skilled PT services required at this time to improve Lt knee mobility and strength for daily movement pattern improvements.    OBJECTIVE IMPAIRMENTS: Abnormal gait, decreased balance, decreased endurance, decreased mobility, difficulty walking, decreased ROM, decreased strength, hypomobility, increased edema, increased fascial restrictions, increased muscle spasms, and pain.   ACTIVITY LIMITATIONS: carrying, lifting, bending, sitting, standing, squatting, sleeping, stairs, transfers, bed mobility, toileting, dressing, and locomotion level  PARTICIPATION LIMITATIONS: meal prep, cleaning, laundry, driving, shopping, community activity, and occupation  PERSONAL FACTORS: 3+ comorbidities: OA, DM, HLD, HTN, MI s/p stent  are also affecting patient's functional outcome.   REHAB POTENTIAL: Fair elevated pain, limited ROM and tolerance to activity  CLINICAL DECISION MAKING: Evolving/moderate complexity  EVALUATION COMPLEXITY: Moderate   GOALS: Goals reviewed with patient? Yes  SHORT TERM GOALS: Target date: 04/12/2023  Independent with initial HEP Goal  status: on going 03/17/2023  2.  Lt knee AROM 5-60 deg for improved function Goal status: on going 03/17/2023  LONG TERM GOALS: Target date: 05/10/2023  Independent with final HEP Goal status: ongoing 03/24/2023  2.  FOTO score improved to 49 Goal status: ongoing 03/24/2023  3.  Lt knee AROM improved to 0-90 for improved mobility Goal status: ongoing 03/24/2023  4.  Report pain < 5/10 with standing and walking for improved function Goal status: ongoing 03/24/2023  5.  Demonstrate ability to amb with LRAD modified independent for improved mobility Goal status: ongoing 03/24/2023  6.  Perform work simulated tasks for improved function  Goal status: ongoing 03/24/2023     PLAN:  PT FREQUENCY:  2-3x/wk  PT DURATION: 8 weeks  PLANNED INTERVENTIONS: Therapeutic exercises, Therapeutic activity, Neuromuscular re-education, Balance training, Gait training, Patient/Family education, Self Care, Joint mobilization, Stair training, DME instructions, Aquatic Therapy, Dry Needling, Electrical stimulation, Cryotherapy, Moist heat, Taping, Vasopneumatic device, Manual therapy, and Re-evaluation  PLAN FOR NEXT SESSION: Improve motion and progress quad strength as able, introduction of more WB activity as tolerated.    Chyrel Masson, PT, DPT, OCS, ATC 03/29/23  9:16 AM

## 2023-03-31 ENCOUNTER — Ambulatory Visit (INDEPENDENT_AMBULATORY_CARE_PROVIDER_SITE_OTHER): Payer: BC Managed Care – PPO | Admitting: Physical Therapy

## 2023-03-31 ENCOUNTER — Encounter: Payer: Self-pay | Admitting: Physical Therapy

## 2023-03-31 DIAGNOSIS — R600 Localized edema: Secondary | ICD-10-CM

## 2023-03-31 DIAGNOSIS — M25662 Stiffness of left knee, not elsewhere classified: Secondary | ICD-10-CM

## 2023-03-31 DIAGNOSIS — M25562 Pain in left knee: Secondary | ICD-10-CM

## 2023-03-31 DIAGNOSIS — R2689 Other abnormalities of gait and mobility: Secondary | ICD-10-CM | POA: Diagnosis not present

## 2023-03-31 DIAGNOSIS — R2681 Unsteadiness on feet: Secondary | ICD-10-CM

## 2023-03-31 DIAGNOSIS — M6281 Muscle weakness (generalized): Secondary | ICD-10-CM

## 2023-03-31 NOTE — Therapy (Signed)
OUTPATIENT PHYSICAL THERAPY TREATMENT   Patient Name: [name] MRN: [id number] DOB:March 24, 1961, 62 y.o., female Today's Date: 03/31/2023  END OF SESSION:  PT End of Session - 03/31/23 0827     Visit Number 8    Number of Visits 16    Date for PT Re-Evaluation 05/10/23    Authorization Type [employer] State $72 copay    PT Start Time 0827    PT Stop Time 0935    PT Time Calculation (min) 68 min    Activity Tolerance Patient limited by pain    Behavior During Therapy [facility] for tasks assessed/performed               Past Medical History:  Diagnosis Date   Arthritis    Campylobacter enteritis May 2012   [facility] admission   Carpal tunnel syndrome, bilateral    Diabetes mellitus    Gastritis with bleeding due to alcohol 2010   [facility] admission   GERD (gastroesophageal reflux disease)    Hyperlipidemia    Hypertension    no meds   Menopause    Myocardial infarction (HCC)    Neck pain    Wears glasses    Past Surgical History:  Procedure Laterality Date   CARPAL TUNNEL RELEASE Left 05/14/2014   Procedure: LEFT ULNAR NEUROPLASTY AT ELBOW AND ENDOSCOPIC CARPAL TUNNEL RELEASE;  Surgeon: Jodi Marble, MD;  Location: [facility];  Service: Orthopedics;  Laterality: Left;   CORONARY STENT INTERVENTION N/A 10/09/2019   Procedure: CORONARY STENT INTERVENTION;  Surgeon: Yates Decamp, MD;  Location: [facility];  Service: Cardiovascular;  Laterality: N/A;   CYST EXCISION  1995   under arms   endometrial biopsy  2004   benign   FOOT OSTEOTOMY  2010   right-spurs   LEFT HEART CATH AND CORONARY ANGIOGRAPHY N/A 10/09/2019   Procedure: LEFT HEART CATH AND CORONARY ANGIOGRAPHY;  Surgeon: Yates Decamp, MD;  Location: [facility];  Service: Cardiovascular;  Laterality: N/A;   TONSILLECTOMY     TOTAL KNEE ARTHROPLASTY Left 02/26/2023   Procedure: LEFT TOTAL KNEE ARTHROPLASTY;  Surgeon: Kathryne Hitch, MD;  Location: [facility];  Service: Orthopedics;   Laterality: Left;  Needs RNFA   ULNAR NERVE TRANSPOSITION Left 05/14/2014   Procedure: ULNAR NERVE DECOMPRESSION/TRANSPOSITION;  Surgeon: Jodi Marble, MD;  Location: [facility];  Service: Orthopedics;  Laterality: Left;   Patient Active Problem List   Diagnosis Date Noted   Status post left knee replacement 02/26/2023   Unilateral primary osteoarthritis, left knee 09/09/2022   Intractable nausea and vomiting 07/04/2022   Hematemesis 07/04/2022   Hypokalemia 07/04/2022   Lactic acidosis 07/04/2022   Pain due to onychomycosis of toenails of both feet 04/28/2022   Diabetic neuropathy, type II diabetes mellitus (HCC) 04/28/2022   De Quervain's tenosynovitis, left 11/13/2021   Bilateral injections given May 21, 2020 degenerative arthritis of knee, bilateral 05/21/2020   Traumatic hematoma of female breast 11/29/2019   Dizziness 11/29/2019   Hospital discharge follow-up 10/23/2019   NSTEMI (non-ST elevated myocardial infarction) (HCC) 10/10/2019   CAD (coronary artery disease) 10/10/2019   Chest pain 10/09/2019   Elevated troponin 10/09/2019   Uncontrolled type 2 diabetes mellitus with hyperglycemia (HCC) 10/09/2019   Obesity (BMI 30.0-34.9) 10/09/2019   Cervical spondylitis with radiculitis (HCC) 08/29/2019   Acute bursitis of left shoulder 01/30/2019   Chronic midline posterior neck pain 06/28/2018   Urgency incontinence 06/28/2018   Degenerative joint disease of  knee, left 04/26/2018   Carpal tunnel syndrome on both sides 03/29/2018   Anemia 01/02/2018   Class 1 obesity with serious comorbidity and body mass index (BMI) of 32.0 to 32.9 in adult 07/23/2017   Left hip pain 05/04/2017   Carpal tunnel syndrome on right 12/27/2016   Polyarthralgia 11/09/2016   Uncontrolled type 2 diabetes mellitus with retinopathy, with long-term current use of insulin 07/07/2016   Visit for preventive health examination 06/21/2016   Increased urinary frequency 06/21/2016   Chronic  meniscal tear of knee 08/19/2015   Left knee pain 06/19/2015   Candidiasis of skin 06/18/2015   Genital herpes 07/31/2014   Ulnar nerve compression 09/21/2013   Noncompliance with diet and medication regimen 11/15/2012   Boil, axilla 08/14/2012   Arthritis    Screening for breast cancer 08/04/2011   Screening for colon cancer 08/04/2011   HIDRADENITIS SUPPURATIVA 04/11/2007   GERD 11/17/2006   ENDOMETRIAL POLYP 11/17/2006   LOW BACK PAIN 11/17/2006   FIBROIDS, UTERUS 11/16/2006   Hyperlipidemia associated with type 2 diabetes mellitus (HCC) 11/16/2006   Essential hypertension 11/16/2006    PCP: Georganna Skeans, MD  REFERRING PROVIDER: Kathryne Hitch, MD  REFERRING DIAG: [phone] (ICD-10-CM) - Status post left knee replacement   THERAPY DIAG:  Acute pain of left knee  Muscle weakness (generalized)  Stiffness of left knee, not elsewhere classified  Other abnormalities of gait and mobility  Unsteadiness on feet  Localized edema  Rationale for Evaluation and Treatment: Rehabilitation  ONSET DATE: 02/26/23  SUBJECTIVE:   SUBJECTIVE STATEMENT:  She stood in kitchen for 15-20 minutes with typical pain.      PERTINENT HISTORY: OA, DM, HLD, HTN, MI s/p stent  PAIN:  NPRS scale: today  5-6/10 in last week lowest 1-2/10 highest 8/10 Pain location: Lt knee Pain description: throbbing Aggravating factors: constant, pressure, end range movements Relieving factors: repositioning, medications  PRECAUTIONS: Fall  WEIGHT BEARING RESTRICTIONS: No  FALLS:  Has patient fallen in last 6 months? Yes. 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[employer]; currently out on leave  PLOF: Independent and Leisure:  fishing  PATIENT GOALS: improve mobility and pain  NEXT MD VISIT: Not scheduled  OBJECTIVE:   PATIENT SURVEYS:  03/15/23: FOTO 24 (predicted 49)  COGNITION: Overall cognitive status: Within functional limits for tasks assessed     SENSATION: WFL  EDEMA:  03/15/23: Circumferential: Knee Joint Line: Rt: 34.5 cm/Lt: 39 cm   PALPATION: 03/15/23: very tender to light touch  LOWER EXTREMITY ROM:  ROM Left 03/15/23 Left 03/22/23 Left 03/24/23 Left 03/31/23  Knee flexion P: 42 A: 40 P: 69*  End of session P:  78* After manual supine AA: 82*  Knee extension P: -6 A: Unable  supine P: -5*  Supine Quad set -3*   (Blank rows = not tested)  LOWER EXTREMITY MMT:    03/15/23: Not formally tested but grossly 2/5 Lt knee  MMT Right  Left 03/29/2023  Knee flexion  4/5  Knee extension  3+/5 in available range   (Blank rows = not tested)  FUNCTIONAL TESTS:  03/15/23:  increased difficulty rising from a chair; decreased gait velocity  GAIT: 03/15/23 Distance walked: 100' Assistive device utilized: Environmental consultant - 2 wheeled Level of assistance: SBA Comments: antalgic  gait, decreased speed   TODAY'S TREATMENT:                                                         DATE: 03/31/2023: Therex: UBE UE/LE AAROM partial revolutions to stretch 3-5 sec holds 10 mins able to go full revolutions for last 1 min.   Gastroc stretch standing step heel depression 30 sec x 2 Heel raise BLEs without UE support 10 reps 2 sets Step up 4" BUE support 10 reps 2 sets Hamstring stretch LLE long sit strap DF with trunk lean 30 sec hold 2 reps Seated LAQ & active knee flexion with contralateral LE opposing motion 1 min Supine quad stretch LLE over edge with strap knee flexion  Supine quad set with ankle on towel roll 5 sec hold 10 reps 2 sets Heel slides with LLE on red theraball with strap 15 reps  Neuro Re-ed Tandem stance on foam beam LLE in front & in back with intermittent touch 30 sec 2 reps  ea Rocker board (square board with 2 pivot points) side to side 1 min  Manual Therapy PROM with overpressure & contract-relax for knee flexion seated. Scar mobs by PT.   Vasopneumatic left knee medium compression 10 min 34* with elevation  03/29/2023 Therex: UBE UE/LE AAROM partial revolutions to stretch 3-5 sec holds 10 mins Incline gastroc stretch standing bilateral 30 sec x 3 Leg press double leg 56 lbs x 15 c slow lowering focus, pause in flexion, Lt single leg 25 lbs 2 x 10 in available range c pause in flexion Seated Lt leg quad set 5 sec hold x 10  Seated SLR 2 x 5 (cues for home use) Seated LAQ x 5 Seated Rt knee overpressure to Lt flexion stretch 15 seconds x 8 (added to HEP)  Neuro Re-ed Pre gait 4 inch step over clear with contralateral fwd/retro step weight shift x 15 bilateral in // bars with light hand assist   Vasopneumatic  left knee medium compression 10 min 34* with elevation   03/26/2023 Manual: Seated Lt knee flexion mobilization c movement IR/distraction, contract/relax techniques for quad inhibition and long duration stretching.    Therex: Nustep Lvl 5 8.5 mins UE/LE seat 9 Incline gastroc stretch standing bilateral 30 sec x 3 Leg press double leg 50 lbs 2 x 10, Lt single leg 25 lbs 2 x 10 in available range Seated LAQ end range pause each direction 1-2 seconds 2x10 2.5 lbs, x10 2.5 post manual Seated isometric extension/flexion alternating 5 sec each way x 15 each  Vasopneumatic  left knee medium compression 10 min 34* with elevation & active muscle activity alphabet with foot.     HOME EXERCISE PROGRAM: Access Code: [email or web address] URL: [email or web address] Date: 03/29/2023 Prepared by: Chyrel Masson  Exercises - Quad Set (Mirrored)  - 5-10 x daily - 7 x weekly - 1 sets - 5-10 reps - 5 sec hold - Supine Heel Slide with Strap  - 5-10 x daily - 7 x weekly - 1 sets - 5-10 reps - Seated Heel Slide (Mirrored)  - 5-10 x daily - 7 x weekly -  1 sets - 5-10 reps - Seated Quad Set (Mirrored)  - 5-10 x daily - 7 x weekly - 1 sets - 5-10 reps - 5 sec hold - Seated Long Arc  Quad (Mirrored)  - 3-5 x daily - 7 x weekly - 1-2 sets - 10 reps - 2 hold - Seated Knee Flexion Extension AAROM with Overpressure (Mirrored)  - 5-6 x daily - 7 x weekly - 1 sets - 5 reps - 10 hold - Seated Straight Leg Heel Taps  - 1-2 x daily - 7 x weekly - 3 sets - 5-10 reps  ASSESSMENT: CLINICAL IMPRESSION: Patient's knee range is slowly improving. Patient has painful end feel with guarding primary limiting factor.  PT began discussion on gym to initiate some community exercising like recumbent stepper.   OBJECTIVE IMPAIRMENTS: Abnormal gait, decreased balance, decreased endurance, decreased mobility, difficulty walking, decreased ROM, decreased strength, hypomobility, increased edema, increased fascial restrictions, increased muscle spasms, and pain.   ACTIVITY LIMITATIONS: carrying, lifting, bending, sitting, standing, squatting, sleeping, stairs, transfers, bed mobility, toileting, dressing, and locomotion level  PARTICIPATION LIMITATIONS: meal prep, cleaning, laundry, driving, shopping, community activity, and occupation  PERSONAL FACTORS: 3+ comorbidities: OA, DM, HLD, HTN, MI s/p stent  are also affecting patient's functional outcome.   REHAB POTENTIAL: Fair elevated pain, limited ROM and tolerance to activity  CLINICAL DECISION MAKING: Evolving/moderate complexity  EVALUATION COMPLEXITY: Moderate   GOALS: Goals reviewed with patient? Yes  SHORT TERM GOALS: Target date: 04/12/2023  Independent with initial HEP Goal status: on going 03/17/2023  2.  Lt knee AROM 5-60 deg for improved function Goal status: on going 03/17/2023  LONG TERM GOALS: Target date: 05/10/2023  Independent with final HEP Goal status: ongoing 03/24/2023  2.  FOTO score improved to 49 Goal status: ongoing 03/24/2023  3.  Lt knee AROM improved to 0-90 for improved  mobility Goal status: ongoing 03/24/2023  4.  Report pain < 5/10 with standing and walking for improved function Goal status: ongoing 03/24/2023  5.  Demonstrate ability to amb with LRAD modified independent for improved mobility Goal status: ongoing 03/24/2023  6.  Perform work simulated tasks for improved function  Goal status: ongoing 03/24/2023     PLAN:  PT FREQUENCY:  2-3x/wk  PT DURATION: 8 weeks  PLANNED INTERVENTIONS: Therapeutic exercises, Therapeutic activity, Neuromuscular re-education, Balance training, Gait training, Patient/Family education, Self Care, Joint mobilization, Stair training, DME instructions, Aquatic Therapy, Dry Needling, Electrical stimulation, Cryotherapy, Moist heat, Taping, Vasopneumatic device, Manual therapy, and Re-evaluation  PLAN FOR NEXT SESSION: continue manual therapy & therapeutic exercise working on range & functional strength.  Improve motion and progress quad strength as able, introduction of more WB activity as tolerated.     Vladimir Faster, PT, DPT 03/31/2023, 9:41 AM

## 2023-04-02 ENCOUNTER — Ambulatory Visit (INDEPENDENT_AMBULATORY_CARE_PROVIDER_SITE_OTHER): Payer: BC Managed Care – PPO | Admitting: Rehabilitative and Restorative Service Providers"

## 2023-04-02 ENCOUNTER — Encounter: Payer: Self-pay | Admitting: Rehabilitative and Restorative Service Providers"

## 2023-04-02 DIAGNOSIS — M25662 Stiffness of left knee, not elsewhere classified: Secondary | ICD-10-CM | POA: Diagnosis not present

## 2023-04-02 DIAGNOSIS — M6281 Muscle weakness (generalized): Secondary | ICD-10-CM | POA: Diagnosis not present

## 2023-04-02 DIAGNOSIS — R600 Localized edema: Secondary | ICD-10-CM

## 2023-04-02 DIAGNOSIS — M25562 Pain in left knee: Secondary | ICD-10-CM | POA: Diagnosis not present

## 2023-04-02 DIAGNOSIS — R2689 Other abnormalities of gait and mobility: Secondary | ICD-10-CM | POA: Diagnosis not present

## 2023-04-02 DIAGNOSIS — R2681 Unsteadiness on feet: Secondary | ICD-10-CM

## 2023-04-02 NOTE — Therapy (Signed)
OUTPATIENT PHYSICAL THERAPY TREATMENT   Patient Name: [name] MRN: [id number] DOB:Mar 22, 1961, 62 y.o., female Today's Date: 04/02/2023  END OF SESSION:  PT End of Session - 04/02/23 1002     Visit Number 9    Number of Visits 16    Date for PT Re-Evaluation 05/10/23    Authorization Type [employer] State $72 copay    Progress Note Due on Visit 10    PT Start Time 1005    PT Stop Time 1055    PT Time Calculation (min) 50 min    Activity Tolerance Patient limited by pain    Behavior During Therapy [facility] for tasks assessed/performed                Past Medical History:  Diagnosis Date   Arthritis    Campylobacter enteritis May 2012   [facility] admission   Carpal tunnel syndrome, bilateral    Diabetes mellitus    Gastritis with bleeding due to alcohol 2010   [facility] admission   GERD (gastroesophageal reflux disease)    Hyperlipidemia    Hypertension    no meds   Menopause    Myocardial infarction (HCC)    Neck pain    Wears glasses    Past Surgical History:  Procedure Laterality Date   CARPAL TUNNEL RELEASE Left 05/14/2014   Procedure: LEFT ULNAR NEUROPLASTY AT ELBOW AND ENDOSCOPIC CARPAL TUNNEL RELEASE;  Surgeon: Jodi Marble, MD;  Location: [facility];  Service: Orthopedics;  Laterality: Left;   CORONARY STENT INTERVENTION N/A 10/09/2019   Procedure: CORONARY STENT INTERVENTION;  Surgeon: Yates Decamp, MD;  Location: [facility];  Service: Cardiovascular;  Laterality: N/A;   CYST EXCISION  1995   under arms   endometrial biopsy  2004   benign   FOOT OSTEOTOMY  2010   right-spurs   LEFT HEART CATH AND CORONARY ANGIOGRAPHY N/A 10/09/2019   Procedure: LEFT HEART CATH AND CORONARY ANGIOGRAPHY;  Surgeon: Yates Decamp, MD;  Location: [facility];  Service: Cardiovascular;  Laterality: N/A;   TONSILLECTOMY     TOTAL KNEE ARTHROPLASTY Left 02/26/2023   Procedure: LEFT TOTAL KNEE ARTHROPLASTY;  Surgeon: Kathryne Hitch, MD;  Location: [facility];  Service: Orthopedics;  Laterality: Left;  Needs RNFA   ULNAR NERVE TRANSPOSITION Left 05/14/2014   Procedure: ULNAR NERVE DECOMPRESSION/TRANSPOSITION;  Surgeon: Jodi Marble, MD;  Location: [facility];  Service: Orthopedics;  Laterality: Left;   Patient Active Problem List   Diagnosis Date Noted   Status post left knee replacement 02/26/2023   Unilateral primary osteoarthritis, left knee 09/09/2022   Intractable nausea and vomiting 07/04/2022   Hematemesis 07/04/2022   Hypokalemia 07/04/2022   Lactic acidosis 07/04/2022   Pain due to onychomycosis of toenails of both feet 04/28/2022   Diabetic neuropathy, type II diabetes mellitus (HCC) 04/28/2022   De Quervain's tenosynovitis, left 11/13/2021   Bilateral injections given May 21, 2020 degenerative arthritis of knee, bilateral 05/21/2020   Traumatic hematoma of female breast 11/29/2019   Dizziness 11/29/2019   Hospital discharge follow-up 10/23/2019   NSTEMI (non-ST elevated myocardial infarction) (HCC) 10/10/2019   CAD (coronary artery disease) 10/10/2019   Chest pain 10/09/2019   Elevated troponin 10/09/2019   Uncontrolled type 2 diabetes mellitus with hyperglycemia (HCC) 10/09/2019   Obesity (BMI 30.0-34.9) 10/09/2019   Cervical spondylitis with radiculitis (HCC) 08/29/2019   Acute bursitis of left shoulder 01/30/2019   Chronic midline posterior neck pain 06/28/2018  Urgency incontinence 06/28/2018   Degenerative joint disease of knee, left 04/26/2018   Carpal tunnel syndrome on both sides 03/29/2018   Anemia 01/02/2018   Class 1 obesity with serious comorbidity and body mass index (BMI) of 32.0 to 32.9 in adult 07/23/2017   Left hip pain 05/04/2017   Carpal tunnel syndrome on right 12/27/2016   Polyarthralgia 11/09/2016   Uncontrolled type 2 diabetes mellitus with retinopathy, with long-term current use of insulin 07/07/2016   Visit for preventive health examination 06/21/2016   Increased urinary  frequency 06/21/2016   Chronic meniscal tear of knee 08/19/2015   Left knee pain 06/19/2015   Candidiasis of skin 06/18/2015   Genital herpes 07/31/2014   Ulnar nerve compression 09/21/2013   Noncompliance with diet and medication regimen 11/15/2012   Boil, axilla 08/14/2012   Arthritis    Screening for breast cancer 08/04/2011   Screening for colon cancer 08/04/2011   HIDRADENITIS SUPPURATIVA 04/11/2007   GERD 11/17/2006   ENDOMETRIAL POLYP 11/17/2006   LOW BACK PAIN 11/17/2006   FIBROIDS, UTERUS 11/16/2006   Hyperlipidemia associated with type 2 diabetes mellitus (HCC) 11/16/2006   Essential hypertension 11/16/2006    PCP: Georganna Skeans, MD  REFERRING PROVIDER: Kathryne Hitch, MD  REFERRING DIAG: [phone] (ICD-10-CM) - Status post left knee replacement   THERAPY DIAG:  Acute pain of left knee  Muscle weakness (generalized)  Stiffness of left knee, not elsewhere classified  Other abnormalities of gait and mobility  Unsteadiness on feet  Localized edema  Rationale for Evaluation and Treatment: Rehabilitation  ONSET DATE: 02/26/23  SUBJECTIVE:   SUBJECTIVE STATEMENT:  Pt indicated feeling tightness and some swelling after being up more on leg yesterday.  Indicated low level pain at rest upon arrival.    PERTINENT HISTORY: OA, DM, HLD, HTN, MI s/p stent  PAIN:  NPRS scale: "low level" per Pt report Pain location: Lt knee Pain description: throbbing Aggravating factors: constant, pressure, end range movements Relieving factors: repositioning, medications  PRECAUTIONS: Fall  WEIGHT BEARING RESTRICTIONS: No  FALLS:  Has patient fallen in last 6 months? Yes. 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[employer]; currently out on leave  PLOF: Independent and Leisure: fishing  PATIENT GOALS: improve mobility and pain  NEXT MD VISIT: Not scheduled  OBJECTIVE:   PATIENT SURVEYS:  03/15/23: FOTO 24 (predicted 49)  COGNITION: Overall cognitive status: Within functional limits for tasks assessed     SENSATION: WFL  EDEMA:  03/15/23: Circumferential: Knee Joint Line: Rt: 34.5 cm/Lt: 39 cm   PALPATION: 03/15/23: very tender to light touch  LOWER EXTREMITY ROM:  ROM Left 03/15/23 Left 03/22/23 Left 03/24/23 Left 03/31/23  Knee flexion P: 42 A: 40 P: 69*  End of session P:  78* After manual supine AA: 82*  Knee extension P: -6 A: Unable  supine P: -5*  Supine Quad set -3*   (Blank rows = not tested)  LOWER EXTREMITY MMT:    03/15/23: Not formally tested but grossly 2/5 Lt knee  MMT Right  Left 03/29/2023  Knee flexion  4/5  Knee extension  3+/5 in available range   (Blank rows = not tested)  FUNCTIONAL TESTS:  03/15/23:  increased difficulty rising from a chair; decreased gait velocity  GAIT: 04/02/2023:  SPC trials in clinic c SBA and cues for use and sequencing.  Antalgic gait noted but not that deviated from gait style c FWW.   03/15/23 Distance walked: 100' Assistive device utilized: Environmental consultant - 2 wheeled Level of assistance: SBA Comments: antalgic gait, decreased speed   TODAY'S TREATMENT:                                                         DATE: 04/02/2023 Therex: UBE UE/LE AAROM partial revolutions to stretch 3-5 sec holds 8 mins Seated Lt knee LAQ c 2 second hold in flexion/extension x 15 Leg press double leg 75  lbs x 15 c slow lowering focus, pause in flexion, Lt single leg 25 lbs 2 x 10 in available range c pause in flexion Seated Rt knee overpressure to Lt flexion stretch 15 seconds x 5  Manual: Seated Lt knee flexion mobilization c movement c IR/distraction/flexion.  Contract/relax techniques for flexion.   Gait training SPC in Rt UE c cues for  sequencing, placement and progression toward step through pattern.  Performed 25 ft, 50 ft x 2.  Noted reduced gait speed.  SBA performed.   Vasopneumatic  left knee medium compression 10 min 34* with elevation  TODAY'S TREATMENT:                                                         DATE:03/31/2023: Therex: UBE UE/LE AAROM partial revolutions to stretch 3-5 sec holds 10 mins able to go full revolutions for last 1 min.   Gastroc stretch standing step heel depression 30 sec x 2 Heel raise BLEs without UE support 10 reps 2 sets Step up 4" BUE support 10 reps 2 sets Hamstring stretch LLE long sit strap DF with trunk lean 30 sec hold 2 reps Seated LAQ & active knee flexion with contralateral LE opposing motion 1 min Supine quad stretch LLE over edge with strap knee flexion  Supine quad set with ankle on towel roll 5 sec hold 10 reps 2 sets Heel slides with LLE on red theraball with strap 15 reps  Neuro Re-ed Tandem stance on foam beam LLE in front & in back with intermittent touch 30 sec 2 reps ea Rocker board (square board with 2 pivot points) side to side 1 min  Manual Therapy PROM with overpressure & contract-relax for knee flexion seated. Scar mobs by PT.   Vasopneumatic left knee medium compression 10 min 34* with elevation  TODAY'S TREATMENT:                                                         DATE: 03/29/2023 Therex: UBE UE/LE AAROM partial revolutions to stretch 3-5 sec holds 10 mins Incline gastroc stretch standing bilateral 30 sec x 3 Leg press double leg 56 lbs x 15 c slow lowering focus, pause in flexion, Lt single leg 25 lbs 2 x 10 in available range c pause in flexion Seated Lt leg quad  set 5 sec hold x 10  Seated SLR 2 x 5 (cues for home use) Seated LAQ x 5 Seated Rt knee overpressure to Lt flexion stretch 15 seconds x 8 (added to HEP)  Neuro Re-ed Pre gait 4 inch step over clear with contralateral fwd/retro step weight shift x 15 bilateral in // bars with light  hand assist   Vasopneumatic  left knee medium compression 10 min 34* with elevation    HOME EXERCISE PROGRAM: Access Code: [email or web address] URL: [email or web address] Date: 03/29/2023 Prepared by: Chyrel Masson  Exercises - Quad Set (Mirrored)  - 5-10 x daily - 7 x weekly - 1 sets - 5-10 reps - 5 sec hold - Supine Heel Slide with Strap  - 5-10 x daily - 7 x weekly - 1 sets - 5-10 reps - Seated Heel Slide (Mirrored)  - 5-10 x daily - 7 x weekly - 1 sets - 5-10 reps - Seated Quad Set (Mirrored)  - 5-10 x daily - 7 x weekly - 1 sets - 5-10 reps - 5 sec hold - Seated Long Arc Quad (Mirrored)  - 3-5 x daily - 7 x weekly - 1-2 sets - 10 reps - 2 hold - Seated Knee Flexion Extension AAROM with Overpressure (Mirrored)  - 5-6 x daily - 7 x weekly - 1 sets - 5 reps - 10 hold - Seated Straight Leg Heel Taps  - 1-2 x daily - 7 x weekly - 3 sets - 5-10 reps  ASSESSMENT: CLINICAL IMPRESSION: Additional time spent in clinic today to address flexion mobility deficits with manual intervention.  Discussed importance of continued focus on mobility gains to help reduce possible manipulation for Lt knee.  SPC use was fairly safe in clinic today. May benefit from continued practice in clinic prior to more use on own.  Flexion mobility limits gait cycle no matter device.   OBJECTIVE IMPAIRMENTS: Abnormal gait, decreased balance, decreased endurance, decreased mobility, difficulty walking, decreased ROM, decreased strength, hypomobility, increased edema, increased fascial restrictions, increased muscle spasms, and pain.   ACTIVITY LIMITATIONS: carrying, lifting, bending, sitting, standing, squatting, sleeping, stairs, transfers, bed mobility, toileting, dressing, and locomotion level  PARTICIPATION LIMITATIONS: meal prep, cleaning, laundry, driving, shopping, community activity, and occupation  PERSONAL FACTORS: 3+ comorbidities: OA, DM, HLD, HTN, MI s/p stent  are also affecting patient's functional  outcome.   REHAB POTENTIAL: Fair elevated pain, limited ROM and tolerance to activity  CLINICAL DECISION MAKING: Evolving/moderate complexity  EVALUATION COMPLEXITY: Moderate   GOALS: Goals reviewed with patient? Yes  SHORT TERM GOALS: Target date: 04/12/2023  Independent with initial HEP Goal status: on going 03/17/2023  2.  Lt knee AROM 5-60 deg for improved function Goal status: on going 03/17/2023  LONG TERM GOALS: Target date: 05/10/2023  Independent with final HEP Goal status: ongoing 03/24/2023  2.  FOTO score improved to 49 Goal status: ongoing 03/24/2023  3.  Lt knee AROM improved to 0-90 for improved mobility Goal status: ongoing 03/24/2023  4.  Report pain < 5/10 with standing and walking for improved function Goal status: ongoing 03/24/2023  5.  Demonstrate ability to amb with LRAD modified independent for improved mobility Goal status: ongoing 03/24/2023  6.  Perform work simulated tasks for improved function  Goal status: ongoing 03/24/2023     PLAN:  PT FREQUENCY:  2-3x/wk  PT DURATION: 8 weeks  PLANNED INTERVENTIONS: Therapeutic exercises, Therapeutic activity, Neuromuscular re-education, Balance training, Gait training, Patient/Family education, Self Care, Joint mobilization, Stair training, DME instructions, Aquatic  Therapy, Dry Needling, Electrical stimulation, Cryotherapy, Moist heat, Taping, Vasopneumatic device, Manual therapy, and Re-evaluation  PLAN FOR NEXT SESSION: 10th visit progress note update with FOTO.     Chyrel Masson, PT, DPT, OCS, ATC 04/02/23  10:42 AM

## 2023-04-05 ENCOUNTER — Ambulatory Visit (INDEPENDENT_AMBULATORY_CARE_PROVIDER_SITE_OTHER): Payer: BC Managed Care – PPO | Admitting: Physical Therapy

## 2023-04-05 ENCOUNTER — Encounter: Payer: Self-pay | Admitting: Physical Therapy

## 2023-04-05 VITALS — BP 158/103 | HR 93

## 2023-04-05 DIAGNOSIS — M25562 Pain in left knee: Secondary | ICD-10-CM

## 2023-04-05 DIAGNOSIS — R2681 Unsteadiness on feet: Secondary | ICD-10-CM

## 2023-04-05 DIAGNOSIS — M25662 Stiffness of left knee, not elsewhere classified: Secondary | ICD-10-CM

## 2023-04-05 DIAGNOSIS — R2689 Other abnormalities of gait and mobility: Secondary | ICD-10-CM | POA: Diagnosis not present

## 2023-04-05 DIAGNOSIS — R600 Localized edema: Secondary | ICD-10-CM

## 2023-04-05 DIAGNOSIS — M6281 Muscle weakness (generalized): Secondary | ICD-10-CM

## 2023-04-05 NOTE — Therapy (Signed)
OUTPATIENT PHYSICAL THERAPY TREATMENT   Patient Name: [name] MRN: [id number] DOB:Mar 16, 1961, 62 y.o., female Today's Date: 04/05/2023  END OF SESSION:  PT End of Session - 04/05/23 0808     Visit Number 10    Number of Visits 16    Date for PT Re-Evaluation 05/10/23    Authorization Type [employer] State $72 copay    Progress Note Due on Visit 10    PT Start Time 0800    PT Stop Time 0840    PT Time Calculation (min) 40 min    Activity Tolerance Patient limited by pain    Behavior During Therapy [facility] for tasks assessed/performed                 Past Medical History:  Diagnosis Date   Arthritis    Campylobacter enteritis May 2012   [facility] admission   Carpal tunnel syndrome, bilateral    Diabetes mellitus    Gastritis with bleeding due to alcohol 2010   [facility] admission   GERD (gastroesophageal reflux disease)    Hyperlipidemia    Hypertension    no meds   Menopause    Myocardial infarction (HCC)    Neck pain    Wears glasses    Past Surgical History:  Procedure Laterality Date   CARPAL TUNNEL RELEASE Left 05/14/2014   Procedure: LEFT ULNAR NEUROPLASTY AT ELBOW AND ENDOSCOPIC CARPAL TUNNEL RELEASE;  Surgeon: Jodi Marble, MD;  Location: [facility];  Service: Orthopedics;  Laterality: Left;   CORONARY STENT INTERVENTION N/A 10/09/2019   Procedure: CORONARY STENT INTERVENTION;  Surgeon: Yates Decamp, MD;  Location: [facility];  Service: Cardiovascular;  Laterality: N/A;   CYST EXCISION  1995   under arms   endometrial biopsy  2004   benign   FOOT OSTEOTOMY  2010   right-spurs   LEFT HEART CATH AND CORONARY ANGIOGRAPHY N/A 10/09/2019   Procedure: LEFT HEART CATH AND CORONARY ANGIOGRAPHY;  Surgeon: Yates Decamp, MD;  Location: [facility];  Service: Cardiovascular;  Laterality: N/A;   TONSILLECTOMY     TOTAL KNEE ARTHROPLASTY Left 02/26/2023   Procedure: LEFT TOTAL KNEE ARTHROPLASTY;  Surgeon: Kathryne Hitch, MD;  Location: [facility];  Service: Orthopedics;  Laterality: Left;  Needs RNFA   ULNAR NERVE TRANSPOSITION Left 05/14/2014   Procedure: ULNAR NERVE DECOMPRESSION/TRANSPOSITION;  Surgeon: Jodi Marble, MD;  Location: [facility];  Service: Orthopedics;  Laterality: Left;   Patient Active Problem List   Diagnosis Date Noted   Status post left knee replacement 02/26/2023   Unilateral primary osteoarthritis, left knee 09/09/2022   Intractable nausea and vomiting 07/04/2022   Hematemesis 07/04/2022   Hypokalemia 07/04/2022   Lactic acidosis 07/04/2022   Pain due to onychomycosis of toenails of both feet 04/28/2022   Diabetic neuropathy, type II diabetes mellitus (HCC) 04/28/2022   De Quervain's tenosynovitis, left 11/13/2021   Bilateral injections given May 21, 2020 degenerative arthritis of knee, bilateral 05/21/2020   Traumatic hematoma of female breast 11/29/2019   Dizziness 11/29/2019   Hospital discharge follow-up 10/23/2019   NSTEMI (non-ST elevated myocardial infarction) (HCC) 10/10/2019   CAD (coronary artery disease) 10/10/2019   Chest pain 10/09/2019   Elevated troponin 10/09/2019   Uncontrolled type 2 diabetes mellitus with hyperglycemia (HCC) 10/09/2019   Obesity (BMI 30.0-34.9) 10/09/2019   Cervical spondylitis with radiculitis (HCC) 08/29/2019   Acute bursitis of left shoulder 01/30/2019   Chronic midline posterior neck pain 06/28/2018  Urgency incontinence 06/28/2018   Degenerative joint disease of knee, left 04/26/2018   Carpal tunnel syndrome on both sides 03/29/2018   Anemia 01/02/2018   Class 1 obesity with serious comorbidity and body mass index (BMI) of 32.0 to 32.9 in adult 07/23/2017   Left hip pain 05/04/2017   Carpal tunnel syndrome on right 12/27/2016   Polyarthralgia 11/09/2016   Uncontrolled type 2 diabetes mellitus with retinopathy, with long-term current use of insulin 07/07/2016   Visit for preventive health examination 06/21/2016   Increased urinary  frequency 06/21/2016   Chronic meniscal tear of knee 08/19/2015   Left knee pain 06/19/2015   Candidiasis of skin 06/18/2015   Genital herpes 07/31/2014   Ulnar nerve compression 09/21/2013   Noncompliance with diet and medication regimen 11/15/2012   Boil, axilla 08/14/2012   Arthritis    Screening for breast cancer 08/04/2011   Screening for colon cancer 08/04/2011   HIDRADENITIS SUPPURATIVA 04/11/2007   GERD 11/17/2006   ENDOMETRIAL POLYP 11/17/2006   LOW BACK PAIN 11/17/2006   FIBROIDS, UTERUS 11/16/2006   Hyperlipidemia associated with type 2 diabetes mellitus (HCC) 11/16/2006   Essential hypertension 11/16/2006    PCP: Georganna Skeans, MD  REFERRING PROVIDER: Kathryne Hitch, MD  REFERRING DIAG: [phone] (ICD-10-CM) - Status post left knee replacement   THERAPY DIAG:  Acute pain of left knee  Muscle weakness (generalized)  Stiffness of left knee, not elsewhere classified  Other abnormalities of gait and mobility  Unsteadiness on feet  Localized edema  Rationale for Evaluation and Treatment: Rehabilitation  ONSET DATE: 02/26/23  SUBJECTIVE:   SUBJECTIVE STATEMENT:  knee was hurting all weekend so she was in the bed most of the weekend; knee feels tighter than it has been.  C/o reflux today.   PERTINENT HISTORY: OA, DM, HLD, HTN, MI s/p stent  PAIN:  NPRS scale: "low level" per Pt report Pain location: Lt knee Pain description: throbbing Aggravating factors: constant, pressure, end range movements Relieving factors: repositioning, medications  PRECAUTIONS: Fall  WEIGHT BEARING RESTRICTIONS: No  FALLS:  Has patient fallen in last 6 months? Yes. 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[employer]; currently out on leave  PLOF: Independent and Leisure: fishing  PATIENT GOALS: improve mobility and pain  NEXT MD VISIT: Not scheduled  OBJECTIVE:   PATIENT SURVEYS:  03/15/23: FOTO 24 (predicted 49) 04/05/23: FOTO 38  COGNITION: Overall cognitive status: Within functional limits for tasks assessed     SENSATION: WFL  EDEMA:  03/15/23: Circumferential: Knee Joint Line: Rt: 34.5 cm/Lt: 39 cm   PALPATION: 03/15/23: very tender to light touch  LOWER EXTREMITY ROM:  ROM Left 03/15/23 Left 03/22/23 Left 03/24/23 Left 03/31/23 Left 04/05/23  Knee flexion P: 42 A: 40 P: 69*  End of session P:  78* After manual supine AA: 82* A: 63 P: 70  Knee extension P: -6 A: Unable  supine P: -5*  Supine Quad set -3* A: Seated LAQ: -15   (Blank rows = not tested)  LOWER EXTREMITY MMT:    03/15/23: Not formally tested but grossly 2/5 Lt knee  MMT Left 03/29/2023  Knee flexion 4/5  Knee extension 3+/5 in available range   (Blank rows = not tested)  FUNCTIONAL TESTS:  03/15/23:  increased difficulty  rising from a chair; decreased gait velocity  GAIT: 04/02/2023:  SPC trials in clinic c SBA and cues for use and sequencing.  Antalgic gait noted but not that deviated from gait style c FWW.   03/15/23 Distance walked: 100' Assistive device utilized: Environmental consultant - 2 wheeled Level of assistance: SBA Comments: antalgic gait, decreased speed   TODAY'S TREATMENT:  04/05/23 Vitals:   04/05/23 0807 04/05/23 0823  BP: (!) 151/101 (!) 158/103  Pulse: 100 93  SpO2: 100%    TherEx UBE UE/LE L1 x 8 min; partial revolutions; seat 7 Seated LAQ 2x10 ROM measurements - see above   04/02/2023 Therex: UBE UE/LE AAROM partial revolutions to stretch 3-5 sec holds 8 mins Seated Lt knee LAQ c 2 second hold in flexion/extension x 15 Leg press double leg 75  lbs x 15 c slow lowering focus, pause in flexion, Lt single leg 25 lbs 2 x 10 in available range c pause in flexion Seated Rt knee overpressure  to Lt flexion stretch 15 seconds x 5  Manual: Seated Lt knee flexion mobilization c movement c IR/distraction/flexion.  Contract/relax techniques for flexion.   Gait training SPC in Rt UE c cues for sequencing, placement and progression toward step through pattern.  Performed 25 ft, 50 ft x 2.  Noted reduced gait speed.  SBA performed.   Vasopneumatic  left knee medium compression 10 min 34* with elevation  TODAY'S TREATMENT:                                                         DATE:03/31/2023: Therex: UBE UE/LE AAROM partial revolutions to stretch 3-5 sec holds 10 mins able to go full revolutions for last 1 min.   Gastroc stretch standing step heel depression 30 sec x 2 Heel raise BLEs without UE support 10 reps 2 sets Step up 4" BUE support 10 reps 2 sets Hamstring stretch LLE long sit strap DF with trunk lean 30 sec hold 2 reps Seated LAQ & active knee flexion with contralateral LE opposing motion 1 min Supine quad stretch LLE over edge with strap knee flexion  Supine quad set with ankle on towel roll 5 sec hold 10 reps 2 sets Heel slides with LLE on red theraball with strap 15 reps  Neuro Re-ed Tandem stance on foam beam LLE in front & in back with intermittent touch 30 sec 2 reps ea Rocker board (square board with 2 pivot points) side to side 1 min  Manual Therapy PROM with overpressure & contract-relax for knee flexion seated. Scar mobs by PT.   Vasopneumatic left knee medium compression 10 min 34* with elevation  TODAY'S TREATMENT:                                                         DATE: 03/29/2023 Therex: UBE UE/LE AAROM partial revolutions to stretch 3-5 sec holds 10 mins Incline gastroc stretch standing bilateral 30 sec x 3 Leg press double leg 56 lbs x 15 c slow lowering focus, pause in flexion, Lt single leg 25 lbs 2 x 10 in available range c pause in flexion Seated Lt leg quad  set 5 sec hold x 10  Seated SLR 2 x 5 (cues for home use) Seated LAQ x 5 Seated  Rt knee overpressure to Lt flexion stretch 15 seconds x 8 (added to HEP)  Neuro Re-ed Pre gait 4 inch step over clear with contralateral fwd/retro step weight shift x 15 bilateral in // bars with light hand assist   Vasopneumatic  left knee medium compression 10 min 34* with elevation    HOME EXERCISE PROGRAM: Access Code: [email or web address] URL: [email or web address] Date: 03/29/2023 Prepared by: Chyrel Masson  Exercises - Quad Set (Mirrored)  - 5-10 x daily - 7 x weekly - 1 sets - 5-10 reps - 5 sec hold - Supine Heel Slide with Strap  - 5-10 x daily - 7 x weekly - 1 sets - 5-10 reps - Seated Heel Slide (Mirrored)  - 5-10 x daily - 7 x weekly - 1 sets - 5-10 reps - Seated Quad Set (Mirrored)  - 5-10 x daily - 7 x weekly - 1 sets - 5-10 reps - 5 sec hold - Seated Long Arc Quad (Mirrored)  - 3-5 x daily - 7 x weekly - 1-2 sets - 10 reps - 2 hold - Seated Knee Flexion Extension AAROM with Overpressure (Mirrored)  - 5-6 x daily - 7 x weekly - 1 sets - 5 reps - 10 hold - Seated Straight Leg Heel Taps  - 1-2 x daily - 7 x weekly - 3 sets - 5-10 reps  ASSESSMENT: CLINICAL IMPRESSION: Limited session today as pt symptomatic with heartburn symptoms, but also with elevated BP and borderline elevated HR. Offered to call PCP and pt declined, but did instruct to seek emergent care should symptoms worsen.  Encouraged regular BP monitoring and to call PCP if BP is still elevated. Still demonstrating decreased strength and ROM deficits.   Therapeutic rest needed due to symptoms.  OBJECTIVE IMPAIRMENTS: Abnormal gait, decreased balance, decreased endurance, decreased mobility, difficulty walking, decreased ROM, decreased strength, hypomobility, increased edema, increased fascial restrictions, increased muscle spasms, and pain.   ACTIVITY LIMITATIONS: carrying, lifting, bending, sitting, standing, squatting, sleeping, stairs, transfers, bed mobility, toileting, dressing, and locomotion  level  PARTICIPATION LIMITATIONS: meal prep, cleaning, laundry, driving, shopping, community activity, and occupation  PERSONAL FACTORS: 3+ comorbidities: OA, DM, HLD, HTN, MI s/p stent  are also affecting patient's functional outcome.   REHAB POTENTIAL: Fair elevated pain, limited ROM and tolerance to activity  CLINICAL DECISION MAKING: Evolving/moderate complexity  EVALUATION COMPLEXITY: Moderate   GOALS: Goals reviewed with patient? Yes  SHORT TERM GOALS: Target date: 04/12/2023  Independent with initial HEP Goal status: on going 03/17/2023  2.  Lt knee AROM 5-60 deg for improved function Goal status: on going 03/17/2023  LONG TERM GOALS: Target date: 05/10/2023  Independent with final HEP Goal status: ongoing 03/24/2023  2.  FOTO score improved to 49 Goal status: ongoing 03/24/2023  3.  Lt knee AROM improved to 0-90 for improved mobility Goal status: ongoing 03/24/2023  4.  Report pain < 5/10 with standing and walking for improved function Goal status: ongoing 03/24/2023  5.  Demonstrate ability to amb with LRAD modified independent for improved mobility Goal status: ongoing 03/24/2023  6.  Perform work simulated tasks for improved function  Goal status: ongoing 03/24/2023     PLAN:  PT FREQUENCY:  2-3x/wk  PT DURATION: 8 weeks  PLANNED INTERVENTIONS: Therapeutic exercises, Therapeutic activity, Neuromuscular re-education, Balance training, Gait training, Patient/Family education, Self Care, Joint mobilization, Stair training, DME instructions,  Aquatic Therapy, Dry Needling, Electrical stimulation, Cryotherapy, Moist heat, Taping, Vasopneumatic device, Manual therapy, and Re-evaluation  PLAN FOR NEXT SESSION: re measure, monitor BP, continue ROM/strengthening   Clarita Crane, PT, DPT 04/05/23 10:06 AM

## 2023-04-07 ENCOUNTER — Encounter: Payer: Self-pay | Admitting: Orthopaedic Surgery

## 2023-04-07 ENCOUNTER — Ambulatory Visit: Payer: BC Managed Care – PPO | Admitting: Orthopaedic Surgery

## 2023-04-07 ENCOUNTER — Ambulatory Visit (INDEPENDENT_AMBULATORY_CARE_PROVIDER_SITE_OTHER): Payer: BC Managed Care – PPO | Admitting: Rehabilitative and Restorative Service Providers"

## 2023-04-07 ENCOUNTER — Encounter: Payer: Self-pay | Admitting: Rehabilitative and Restorative Service Providers"

## 2023-04-07 DIAGNOSIS — M6281 Muscle weakness (generalized): Secondary | ICD-10-CM | POA: Diagnosis not present

## 2023-04-07 DIAGNOSIS — R2681 Unsteadiness on feet: Secondary | ICD-10-CM

## 2023-04-07 DIAGNOSIS — M25662 Stiffness of left knee, not elsewhere classified: Secondary | ICD-10-CM

## 2023-04-07 DIAGNOSIS — M25562 Pain in left knee: Secondary | ICD-10-CM

## 2023-04-07 DIAGNOSIS — R2689 Other abnormalities of gait and mobility: Secondary | ICD-10-CM | POA: Diagnosis not present

## 2023-04-07 DIAGNOSIS — T8482XD Fibrosis due to internal orthopedic prosthetic devices, implants and grafts, subsequent encounter: Secondary | ICD-10-CM

## 2023-04-07 DIAGNOSIS — R600 Localized edema: Secondary | ICD-10-CM

## 2023-04-07 DIAGNOSIS — Z96652 Presence of left artificial knee joint: Secondary | ICD-10-CM

## 2023-04-07 NOTE — Therapy (Addendum)
OUTPATIENT PHYSICAL THERAPY TREATMENT   Patient Name: [name] MRN: [id number] DOB:August 17, 1961, 62 y.o., female Today's Date: 04/07/2023  END OF SESSION:  PT End of Session - 04/07/23 0828     Visit Number 11    Number of Visits 16    Date for PT Re-Evaluation 05/10/23    Authorization Type [employer] State $72 copay    Progress Note Due on Visit 12    PT Start Time 0800    PT Stop Time 0838    PT Time Calculation (min) 38 min    Activity Tolerance Patient limited by pain    Behavior During Therapy [facility] for tasks assessed/performed                  Past Medical History:  Diagnosis Date   Arthritis    Campylobacter enteritis May 2012   [facility] admission   Carpal tunnel syndrome, bilateral    Diabetes mellitus    Gastritis with bleeding due to alcohol 2010   [facility] admission   GERD (gastroesophageal reflux disease)    Hyperlipidemia    Hypertension    no meds   Menopause    Myocardial infarction (HCC)    Neck pain    Wears glasses    Past Surgical History:  Procedure Laterality Date   CARPAL TUNNEL RELEASE Left 05/14/2014   Procedure: LEFT ULNAR NEUROPLASTY AT ELBOW AND ENDOSCOPIC CARPAL TUNNEL RELEASE;  Surgeon: Jodi Marble, MD;  Location: [facility];  Service: Orthopedics;  Laterality: Left;   CORONARY STENT INTERVENTION N/A 10/09/2019   Procedure: CORONARY STENT INTERVENTION;  Surgeon: Yates Decamp, MD;  Location: [facility];  Service: Cardiovascular;  Laterality: N/A;   CYST EXCISION  1995   under arms   endometrial biopsy  2004   benign   FOOT OSTEOTOMY  2010   right-spurs   LEFT HEART CATH AND CORONARY ANGIOGRAPHY N/A 10/09/2019   Procedure: LEFT HEART CATH AND CORONARY ANGIOGRAPHY;  Surgeon: Yates Decamp, MD;  Location: [facility];  Service: Cardiovascular;  Laterality: N/A;   TONSILLECTOMY     TOTAL KNEE ARTHROPLASTY Left 02/26/2023   Procedure: LEFT TOTAL KNEE ARTHROPLASTY;  Surgeon: Kathryne Hitch, MD;  Location:  [facility];  Service: Orthopedics;  Laterality: Left;  Needs RNFA   ULNAR NERVE TRANSPOSITION Left 05/14/2014   Procedure: ULNAR NERVE DECOMPRESSION/TRANSPOSITION;  Surgeon: Jodi Marble, MD;  Location: [facility];  Service: Orthopedics;  Laterality: Left;   Patient Active Problem List   Diagnosis Date Noted   Status post left knee replacement 02/26/2023   Unilateral primary osteoarthritis, left knee 09/09/2022   Intractable nausea and vomiting 07/04/2022   Hematemesis 07/04/2022   Hypokalemia 07/04/2022   Lactic acidosis 07/04/2022   Pain due to onychomycosis of toenails of both feet 04/28/2022   Diabetic neuropathy, type II diabetes mellitus (HCC) 04/28/2022   De Quervain's tenosynovitis, left 11/13/2021   Bilateral injections given May 21, 2020 degenerative arthritis of knee, bilateral 05/21/2020   Traumatic hematoma of female breast 11/29/2019   Dizziness 11/29/2019   Hospital discharge follow-up 10/23/2019   NSTEMI (non-ST elevated myocardial infarction) (HCC) 10/10/2019   CAD (coronary artery disease) 10/10/2019   Chest pain 10/09/2019   Elevated troponin 10/09/2019   Uncontrolled type 2 diabetes mellitus with hyperglycemia (HCC) 10/09/2019   Obesity (BMI 30.0-34.9) 10/09/2019   Cervical spondylitis with radiculitis (HCC) 08/29/2019   Acute bursitis of left shoulder 01/30/2019   Chronic midline posterior neck pain  06/28/2018   Urgency incontinence 06/28/2018   Degenerative joint disease of knee, left 04/26/2018   Carpal tunnel syndrome on both sides 03/29/2018   Anemia 01/02/2018   Class 1 obesity with serious comorbidity and body mass index (BMI) of 32.0 to 32.9 in adult 07/23/2017   Left hip pain 05/04/2017   Carpal tunnel syndrome on right 12/27/2016   Polyarthralgia 11/09/2016   Uncontrolled type 2 diabetes mellitus with retinopathy, with long-term current use of insulin 07/07/2016   Visit for preventive health examination 06/21/2016   Increased urinary  frequency 06/21/2016   Chronic meniscal tear of knee 08/19/2015   Left knee pain 06/19/2015   Candidiasis of skin 06/18/2015   Genital herpes 07/31/2014   Ulnar nerve compression 09/21/2013   Noncompliance with diet and medication regimen 11/15/2012   Boil, axilla 08/14/2012   Arthritis    Screening for breast cancer 08/04/2011   Screening for colon cancer 08/04/2011   HIDRADENITIS SUPPURATIVA 04/11/2007   GERD 11/17/2006   ENDOMETRIAL POLYP 11/17/2006   LOW BACK PAIN 11/17/2006   FIBROIDS, UTERUS 11/16/2006   Hyperlipidemia associated with type 2 diabetes mellitus (HCC) 11/16/2006   Essential hypertension 11/16/2006    PCP: Georganna Skeans, MD  REFERRING PROVIDER: Kathryne Hitch, MD  REFERRING DIAG: [phone] (ICD-10-CM) - Status post left knee replacement   THERAPY DIAG:  Acute pain of left knee  Muscle weakness (generalized)  Stiffness of left knee, not elsewhere classified  Other abnormalities of gait and mobility  Unsteadiness on feet  Localized edema  Rationale for Evaluation and Treatment: Rehabilitation  ONSET DATE: 02/26/23  SUBJECTIVE:   SUBJECTIVE STATEMENT:  knee was hurting all weekend so she was in the bed most of the weekend; knee feels tighter than it has been.  C/o reflux today.   PERTINENT HISTORY: OA, DM, HLD, HTN, MI s/p stent  PAIN:  NPRS scale: "low level" per Pt report Pain location: Lt knee Pain description: throbbing Aggravating factors: constant, pressure, end range movements Relieving factors: repositioning, medications  PRECAUTIONS: Fall  WEIGHT BEARING RESTRICTIONS: No  FALLS:  Has patient fallen in last 6 months? Yes. 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[employer]; currently out on leave  PLOF: Independent and Leisure: fishing  PATIENT GOALS: improve mobility and pain  NEXT MD VISIT: Not scheduled  OBJECTIVE:   PATIENT SURVEYS:  03/15/23: FOTO 24 (predicted 49) 04/05/23: FOTO 38  COGNITION: Overall cognitive status: Within functional limits for tasks assessed     SENSATION: WFL  EDEMA:  03/15/23: Circumferential: Knee Joint Line: Rt: 34.5 cm/Lt: 39 cm   PALPATION: 03/15/23: very tender to light touch  LOWER EXTREMITY ROM:  ROM Left 03/15/23 Left 03/22/23 Left 03/24/23 Left 03/31/23 Left 04/05/23 Left 04/07/2023 After manual  Knee flexion P: 42 A: 40 P: 69*  End of session P:  78* After manual supine AA: 82* A: 63 P: 70 Supine passive heel slide  88   Knee extension P: -6 A: Unable  supine P: -5*  Supine Quad set -3* A: Seated LAQ: -15    (Blank rows = not tested)  LOWER EXTREMITY MMT:    03/15/23: Not formally tested but grossly 2/5 Lt knee  MMT Left 03/29/2023  Knee flexion 4/5  Knee extension 3+/5 in available range   (  Blank rows = not tested)  FUNCTIONAL TESTS:  03/15/23:  increased difficulty rising from a chair; decreased gait velocity  GAIT: 04/02/2023:  SPC trials in clinic c SBA and cues for use and sequencing.  Antalgic gait noted but not that deviated from gait style c FWW.   03/15/23 Distance walked: 100' Assistive device utilized: Environmental consultant - 2 wheeled Level of assistance: SBA Comments: antalgic gait, decreased speed   TODAY'S TREATMENT:                                                            DATE: 04/07/2023 Vitals upon arrival:  165/102 with 90 pulse   Therex: Supine LAQ in 90 deg hip flexion Lt leg x 10  Supine heel slide c strap Lt leg 10 sec hold x 8 Supine quad set 5 sec hold x 10   Manual: Seated Lt knee flexion mobilization c movement c IR/distraction/flexion.  Percussive device to Lt quad c sustained knee flexion hold.  Performed similar in supine positioning as well.    Vasopneumatic   left knee medium compression 10 min 34* with elevation  TODAY'S TREATMENT:                                                            DATE:  04/05/23  TherEx UBE UE/LE L1 x 8 min; partial revolutions; seat 7 Seated LAQ 2x10 ROM measurements - see above   TODAY'S TREATMENT:                                                            DATE:  04/02/2023 Therex: UBE UE/LE AAROM partial revolutions to stretch 3-5 sec holds 8 mins Seated Lt knee LAQ c 2 second hold in flexion/extension x 15 Leg press double leg 75  lbs x 15 c slow lowering focus, pause in flexion, Lt single leg 25 lbs 2 x 10 in available range c pause in flexion Seated Rt knee overpressure to Lt flexion stretch 15 seconds x 5  Manual: Seated Lt knee flexion mobilization c movement c IR/distraction/flexion.  Contract/relax techniques for flexion.   Gait training SPC in Rt UE c cues for sequencing, placement and progression toward step through pattern.  Performed 25 ft, 50 ft x 2.  Noted reduced gait speed.  SBA performed.   Vasopneumatic  left knee medium compression 10 min 34* with elevation  TODAY'S TREATMENT:                                                         DATE:03/31/2023: Therex: UBE UE/LE AAROM partial revolutions to stretch 3-5 sec holds 10 mins able to go full revolutions for last 1 min.   Gastroc stretch standing step heel depression 30 sec  x 2 Heel raise BLEs without UE support 10 reps 2 sets Step up 4" BUE support 10 reps 2 sets Hamstring stretch LLE long sit strap DF with trunk lean 30 sec hold 2 reps Seated LAQ & active knee flexion with contralateral LE opposing motion 1 min Supine quad stretch LLE over edge with strap knee flexion  Supine quad set with ankle on towel roll 5 sec hold 10 reps 2 sets Heel slides with LLE on red theraball with strap 15 reps  Neuro Re-ed Tandem stance on foam beam LLE in front & in back with intermittent touch 30 sec 2 reps ea Rocker board (square board with 2 pivot points)  side to side 1 min  Manual Therapy PROM with overpressure & contract-relax for knee flexion seated. Scar mobs by PT.   Vasopneumatic left knee medium compression 10 min 34* with elevation   HOME EXERCISE PROGRAM: Access Code: [email or web address] URL: [email or web address] Date: 03/29/2023 Prepared by: Chyrel Masson  Exercises - Quad Set (Mirrored)  - 5-10 x daily - 7 x weekly - 1 sets - 5-10 reps - 5 sec hold - Supine Heel Slide with Strap  - 5-10 x daily - 7 x weekly - 1 sets - 5-10 reps - Seated Heel Slide (Mirrored)  - 5-10 x daily - 7 x weekly - 1 sets - 5-10 reps - Seated Quad Set (Mirrored)  - 5-10 x daily - 7 x weekly - 1 sets - 5-10 reps - 5 sec hold - Seated Long Arc Quad (Mirrored)  - 3-5 x daily - 7 x weekly - 1-2 sets - 10 reps - 2 hold - Seated Knee Flexion Extension AAROM with Overpressure (Mirrored)  - 5-6 x daily - 7 x weekly - 1 sets - 5 reps - 10 hold - Seated Straight Leg Heel Taps  - 1-2 x daily - 7 x weekly - 3 sets - 5-10 reps  ASSESSMENT: CLINICAL IMPRESSION: Recheck of BP showed similar measurement today vs last visit.  No symptoms reported today systemically.  Advised follow up with PCP again regarding BP.  Due to BP, spent today with focus on seated and supine mobility to avoid increased BP with resistance training.   Increased manual intervention was recommended anyway due to restriction for Lt knee mobility.  Tolerance to percussive device to Lt quad was fair at best but marked improvement in quad inhibition and mobility as noted to help improve flexion.   OBJECTIVE IMPAIRMENTS: Abnormal gait, decreased balance, decreased endurance, decreased mobility, difficulty walking, decreased ROM, decreased strength, hypomobility, increased edema, increased fascial restrictions, increased muscle spasms, and pain.   ACTIVITY LIMITATIONS: carrying, lifting, bending, sitting, standing, squatting, sleeping, stairs, transfers, bed mobility, toileting, dressing, and  locomotion level  PARTICIPATION LIMITATIONS: meal prep, cleaning, laundry, driving, shopping, community activity, and occupation  PERSONAL FACTORS: 3+ comorbidities: OA, DM, HLD, HTN, MI s/p stent  are also affecting patient's functional outcome.   REHAB POTENTIAL: Fair elevated pain, limited ROM and tolerance to activity  CLINICAL DECISION MAKING: Evolving/moderate complexity  EVALUATION COMPLEXITY: Moderate   GOALS: Goals reviewed with patient? Yes  SHORT TERM GOALS: Target date: 04/12/2023  Independent with initial HEP Goal status: on going 03/17/2023  2.  Lt knee AROM 5-60 deg for improved function Goal status: on going 03/17/2023  LONG TERM GOALS: Target date: 05/10/2023  Independent with final HEP Goal status: ongoing 03/24/2023  2.  FOTO score improved to 49 Goal status: ongoing 03/24/2023  3.  Lt knee AROM improved to  0-90 for improved mobility Goal status: ongoing 03/24/2023  4.  Report pain < 5/10 with standing and walking for improved function Goal status: ongoing 03/24/2023  5.  Demonstrate ability to amb with LRAD modified independent for improved mobility Goal status: ongoing 03/24/2023  6.  Perform work simulated tasks for improved function  Goal status: ongoing 03/24/2023     PLAN:  PT FREQUENCY:  2-3x/wk  PT DURATION: 8 weeks  PLANNED INTERVENTIONS: Therapeutic exercises, Therapeutic activity, Neuromuscular re-education, Balance training, Gait training, Patient/Family education, Self Care, Joint mobilization, Stair training, DME instructions, Aquatic Therapy, Dry Needling, Electrical stimulation, Cryotherapy, Moist heat, Taping, Vasopneumatic device, Manual therapy, and Re-evaluation  PLAN FOR NEXT SESSION: Continue vital monitoring as needed.  Resume strengthening as able.   Continue capsular stretching and muscle inhibition techniques to gain mobility  Held progress note today due to extensive manual intervention and treatment adjustments due to BP.    Perform update next visit.    Chyrel Masson, PT, DPT, OCS, ATC 04/07/23  8:31 AM

## 2023-04-07 NOTE — Progress Notes (Signed)
The patient is now 6 weeks status post a left total knee arthroplasty.  She had physical therapy this morning and the best flexion they have been able to get on that knee is 88 degrees.  Down here this morning later after therapy I can maybe flex her to 80 degrees or less.  She understands that we are recommending a manipulation under anesthesia based off of this motion.  I described in detail what this procedure involves as well as discussed the risk and benefits of a procedure such as this.  She agrees with this setting her up for a manipulation under anesthesia.  Since this may not be for a week or so I want her to continue physical therapy.  She did not need to have a therapy session the afternoon of the manipulation if possible.  She will need outpatient physical therapy for at least 4-6 more weeks.  When we see her back for her follow-up appointment, I would like a standing AP and lateral of her left operative knee.

## 2023-04-09 ENCOUNTER — Ambulatory Visit (INDEPENDENT_AMBULATORY_CARE_PROVIDER_SITE_OTHER): Payer: BC Managed Care – PPO | Admitting: Physical Therapy

## 2023-04-09 ENCOUNTER — Telehealth: Payer: Self-pay

## 2023-04-09 ENCOUNTER — Encounter: Payer: Self-pay | Admitting: Physical Therapy

## 2023-04-09 DIAGNOSIS — M6281 Muscle weakness (generalized): Secondary | ICD-10-CM

## 2023-04-09 DIAGNOSIS — R2681 Unsteadiness on feet: Secondary | ICD-10-CM

## 2023-04-09 DIAGNOSIS — M25562 Pain in left knee: Secondary | ICD-10-CM | POA: Diagnosis not present

## 2023-04-09 DIAGNOSIS — R2689 Other abnormalities of gait and mobility: Secondary | ICD-10-CM

## 2023-04-09 DIAGNOSIS — R600 Localized edema: Secondary | ICD-10-CM

## 2023-04-09 DIAGNOSIS — M25662 Stiffness of left knee, not elsewhere classified: Secondary | ICD-10-CM | POA: Diagnosis not present

## 2023-04-09 NOTE — Telephone Encounter (Signed)
Called and advised pt. If she can to postpone cleaning until 3 months out so she doesn't need the antibiotics is preferred. Pt stated she would reschedule.

## 2023-04-09 NOTE — Telephone Encounter (Signed)
Patient goes for teeth cleaning next Wednesday, 04/14/23.  Is that okay?  Does she need to be pre-medicated?

## 2023-04-09 NOTE — Therapy (Signed)
OUTPATIENT PHYSICAL THERAPY TREATMENT   Patient Name: [name] MRN: [id number] DOB:05-May-1961, 62 y.o., female Today's Date: 04/09/2023  END OF SESSION:  PT End of Session - 04/09/23 0812     Visit Number 12    Number of Visits 16    Date for PT Re-Evaluation 05/10/23    Authorization Type [employer] State $72 copay    PT Start Time 0802    PT Stop Time 0850    PT Time Calculation (min) 48 min    Activity Tolerance Patient limited by pain    Behavior During Therapy [facility] for tasks assessed/performed                   Past Medical History:  Diagnosis Date   Arthritis    Campylobacter enteritis May 2012   [facility] admission   Carpal tunnel syndrome, bilateral    Diabetes mellitus    Gastritis with bleeding due to alcohol 2010   [facility] admission   GERD (gastroesophageal reflux disease)    Hyperlipidemia    Hypertension    no meds   Menopause    Myocardial infarction (HCC)    Neck pain    Wears glasses    Past Surgical History:  Procedure Laterality Date   CARPAL TUNNEL RELEASE Left 05/14/2014   Procedure: LEFT ULNAR NEUROPLASTY AT ELBOW AND ENDOSCOPIC CARPAL TUNNEL RELEASE;  Surgeon: Jodi Marble, MD;  Location: [facility];  Service: Orthopedics;  Laterality: Left;   CORONARY STENT INTERVENTION N/A 10/09/2019   Procedure: CORONARY STENT INTERVENTION;  Surgeon: Yates Decamp, MD;  Location: [facility];  Service: Cardiovascular;  Laterality: N/A;   CYST EXCISION  1995   under arms   endometrial biopsy  2004   benign   FOOT OSTEOTOMY  2010   right-spurs   LEFT HEART CATH AND CORONARY ANGIOGRAPHY N/A 10/09/2019   Procedure: LEFT HEART CATH AND CORONARY ANGIOGRAPHY;  Surgeon: Yates Decamp, MD;  Location: [facility];  Service: Cardiovascular;  Laterality: N/A;   TONSILLECTOMY     TOTAL KNEE ARTHROPLASTY Left 02/26/2023   Procedure: LEFT TOTAL KNEE ARTHROPLASTY;  Surgeon: Kathryne Hitch, MD;  Location: [facility];  Service: Orthopedics;   Laterality: Left;  Needs RNFA   ULNAR NERVE TRANSPOSITION Left 05/14/2014   Procedure: ULNAR NERVE DECOMPRESSION/TRANSPOSITION;  Surgeon: Jodi Marble, MD;  Location: [facility];  Service: Orthopedics;  Laterality: Left;   Patient Active Problem List   Diagnosis Date Noted   Arthrofibrosis of total knee arthroplasty, subsequent encounter 04/07/2023   Status post left knee replacement 02/26/2023   Unilateral primary osteoarthritis, left knee 09/09/2022   Intractable nausea and vomiting 07/04/2022   Hematemesis 07/04/2022   Hypokalemia 07/04/2022   Lactic acidosis 07/04/2022   Pain due to onychomycosis of toenails of both feet 04/28/2022   Diabetic neuropathy, type II diabetes mellitus (HCC) 04/28/2022   De Quervain's tenosynovitis, left 11/13/2021   Bilateral injections given May 21, 2020 degenerative arthritis of knee, bilateral 05/21/2020   Traumatic hematoma of female breast 11/29/2019   Dizziness 11/29/2019   Hospital discharge follow-up 10/23/2019   NSTEMI (non-ST elevated myocardial infarction) (HCC) 10/10/2019   CAD (coronary artery disease) 10/10/2019   Chest pain 10/09/2019   Elevated troponin 10/09/2019   Uncontrolled type 2 diabetes mellitus with hyperglycemia (HCC) 10/09/2019   Obesity (BMI 30.0-34.9) 10/09/2019   Cervical spondylitis with radiculitis (HCC) 08/29/2019   Acute bursitis of left shoulder 01/30/2019   Chronic midline posterior  neck pain 06/28/2018   Urgency incontinence 06/28/2018   Degenerative joint disease of knee, left 04/26/2018   Carpal tunnel syndrome on both sides 03/29/2018   Anemia 01/02/2018   Class 1 obesity with serious comorbidity and body mass index (BMI) of 32.0 to 32.9 in adult 07/23/2017   Left hip pain 05/04/2017   Carpal tunnel syndrome on right 12/27/2016   Polyarthralgia 11/09/2016   Uncontrolled type 2 diabetes mellitus with retinopathy, with long-term current use of insulin 07/07/2016   Visit for preventive  health examination 06/21/2016   Increased urinary frequency 06/21/2016   Chronic meniscal tear of knee 08/19/2015   Left knee pain 06/19/2015   Candidiasis of skin 06/18/2015   Genital herpes 07/31/2014   Ulnar nerve compression 09/21/2013   Noncompliance with diet and medication regimen 11/15/2012   Boil, axilla 08/14/2012   Arthritis    Screening for breast cancer 08/04/2011   Screening for colon cancer 08/04/2011   HIDRADENITIS SUPPURATIVA 04/11/2007   GERD 11/17/2006   ENDOMETRIAL POLYP 11/17/2006   LOW BACK PAIN 11/17/2006   FIBROIDS, UTERUS 11/16/2006   Hyperlipidemia associated with type 2 diabetes mellitus (HCC) 11/16/2006   Essential hypertension 11/16/2006    PCP: Georganna Skeans, MD  REFERRING PROVIDER: Kathryne Hitch, MD  REFERRING DIAG: [phone] (ICD-10-CM) - Status post left knee replacement   THERAPY DIAG:  Acute pain of left knee  Muscle weakness (generalized)  Stiffness of left knee, not elsewhere classified  Other abnormalities of gait and mobility  Unsteadiness on feet  Localized edema  Rationale for Evaluation and Treatment: Rehabilitation  ONSET DATE: 02/26/23  SUBJECTIVE:   SUBJECTIVE STATEMENT:  MD is recommending a manipulation, not scheduled yet    PERTINENT HISTORY: OA, DM, HLD, HTN, MI s/p stent  PAIN:  NPRS scale: 6/10 Pain location: Lt knee Pain description: throbbing Aggravating factors: constant, pressure, end range movements Relieving factors: repositioning, medications  PRECAUTIONS: Fall  WEIGHT BEARING RESTRICTIONS: No  FALLS:  Has patient fallen in last 6 months? Yes. 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[employer]; currently out on leave  PLOF:  Independent and Leisure: fishing  PATIENT GOALS: improve mobility and pain  NEXT MD VISIT: Not scheduled  OBJECTIVE:   PATIENT SURVEYS:  03/15/23: FOTO 24 (predicted 49) 04/05/23: FOTO 38  COGNITION: Overall cognitive status: Within functional limits for tasks assessed     SENSATION: WFL  EDEMA:  03/15/23: Circumferential: Knee Joint Line: Rt: 34.5 cm/Lt: 39 cm   PALPATION: 03/15/23: very tender to light touch  LOWER EXTREMITY ROM:  ROM Left 03/15/23 Left 03/22/23 Left 03/24/23 Left 03/31/23 Left 04/05/23 Left 04/07/2023 After manual Left 04/09/23  Knee flexion P: 42 A: 40 P: 69*  End of session P:  78* After manual supine AA: 82* A: 63 P: 70 Supine passive heel slide  88  A: 73 P: 82  Knee extension P: -6 A: Unable  supine P: -5*  Supine Quad set -3* A: Seated LAQ: -15     (Blank rows = not tested)  LOWER EXTREMITY MMT:    03/15/23: Not formally tested but grossly 2/5 Lt knee  MMT Left 03/29/2023  Knee flexion 4/5  Knee extension 3+/5 in available range   (Blank rows = not tested)  FUNCTIONAL TESTS:  03/15/23:  increased difficulty rising from a chair; decreased gait velocity  GAIT: 04/02/2023:  SPC trials in clinic c SBA and cues for use and sequencing.  Antalgic gait noted but not that deviated from gait style c FWW.   03/15/23 Distance walked: 100' Assistive device utilized: Environmental consultant - 2 wheeled Level of assistance: SBA Comments: antalgic gait, decreased speed   TODAY'S TREATMENT: DATE:  04/09/23 Vitals: BP: 114/70; HR 99  TherEx NuStep L6 x 9 min ROM measurements - see above  Manual Use of percussive device with seated knee flexion to tolerance; LLLD stretching utilized with occasional rest breaks.  Vasopneumatic Left knee medium compression 10 min 34* with elevation  04/07/2023 Vitals upon arrival:  165/102 with 90 pulse   Therex: Supine LAQ in 90 deg hip flexion Lt leg x 10  Supine heel slide c strap Lt leg 10 sec hold x 8 Supine quad set 5  sec hold x 10   Manual: Seated Lt knee flexion mobilization c movement c IR/distraction/flexion.  Percussive device to Lt quad c sustained knee flexion hold.  Performed similar in supine positioning as well.    Vasopneumatic  left knee medium compression 10 min 34* with elevation  TODAY'S TREATMENT:                                                            DATE:  04/05/23  TherEx UBE UE/LE L1 x 8 min; partial revolutions; seat 7 Seated LAQ 2x10 ROM measurements - see above   TODAY'S TREATMENT:                                                            DATE:  04/02/2023 Therex: UBE UE/LE AAROM partial revolutions to stretch 3-5 sec holds 8 mins Seated Lt knee LAQ c 2 second hold in flexion/extension x 15 Leg press double leg 75  lbs x 15 c slow lowering focus, pause in flexion, Lt single leg 25 lbs 2 x 10 in available range c pause in flexion Seated Rt knee overpressure to Lt flexion stretch 15 seconds x 5  Manual: Seated Lt knee flexion mobilization c movement c IR/distraction/flexion.  Contract/relax techniques for flexion.   Gait training SPC in Rt UE c cues for sequencing, placement and progression toward step through pattern.  Performed 25 ft, 50 ft x 2.  Noted reduced gait speed.  SBA performed.   Vasopneumatic  left knee medium compression 10 min 34* with elevation  TODAY'S TREATMENT:                                                         DATE:03/31/2023: Therex: UBE UE/LE AAROM partial revolutions to stretch 3-5 sec holds 10 mins able to go full revolutions for last 1 min.   Gastroc stretch standing step heel depression 30 sec x 2 Heel raise BLEs without UE support 10 reps 2 sets Step up 4" BUE support 10 reps 2  sets Hamstring stretch LLE long sit strap DF with trunk lean 30 sec hold 2 reps Seated LAQ & active knee flexion with contralateral LE opposing motion 1 min Supine quad stretch LLE over edge with strap knee flexion  Supine quad set with ankle on towel roll 5 sec  hold 10 reps 2 sets Heel slides with LLE on red theraball with strap 15 reps  Neuro Re-ed Tandem stance on foam beam LLE in front & in back with intermittent touch 30 sec 2 reps ea Rocker board (square board with 2 pivot points) side to side 1 min  Manual Therapy PROM with overpressure & contract-relax for knee flexion seated. Scar mobs by PT.   Vasopneumatic left knee medium compression 10 min 34* with elevation   HOME EXERCISE PROGRAM: Access Code: [email or web address] URL: [email or web address] Date: 03/29/2023 Prepared by: Chyrel Masson  Exercises - Quad Set (Mirrored)  - 5-10 x daily - 7 x weekly - 1 sets - 5-10 reps - 5 sec hold - Supine Heel Slide with Strap  - 5-10 x daily - 7 x weekly - 1 sets - 5-10 reps - Seated Heel Slide (Mirrored)  - 5-10 x daily - 7 x weekly - 1 sets - 5-10 reps - Seated Quad Set (Mirrored)  - 5-10 x daily - 7 x weekly - 1 sets - 5-10 reps - 5 sec hold - Seated Long Arc Quad (Mirrored)  - 3-5 x daily - 7 x weekly - 1-2 sets - 10 reps - 2 hold - Seated Knee Flexion Extension AAROM with Overpressure (Mirrored)  - 5-6 x daily - 7 x weekly - 1 sets - 5 reps - 10 hold - Seated Straight Leg Heel Taps  - 1-2 x daily - 7 x weekly - 3 sets - 5-10 reps  ASSESSMENT: CLINICAL IMPRESSION: Continued work on knee flexion today, and she is able to relax with percussive device.  AROM improved today, but planning manipulation.  Decreasing frequency to 1x/wk until manipulation due to high copay.  Will continue to benefit from PT to maximize function.  OBJECTIVE IMPAIRMENTS: Abnormal gait, decreased balance, decreased endurance, decreased mobility, difficulty walking, decreased ROM, decreased strength, hypomobility, increased edema, increased fascial restrictions, increased muscle spasms, and pain.   ACTIVITY LIMITATIONS: carrying, lifting, bending, sitting, standing, squatting, sleeping, stairs, transfers, bed mobility, toileting, dressing, and locomotion  level  PARTICIPATION LIMITATIONS: meal prep, cleaning, laundry, driving, shopping, community activity, and occupation  PERSONAL FACTORS: 3+ comorbidities: OA, DM, HLD, HTN, MI s/p stent  are also affecting patient's functional outcome.   REHAB POTENTIAL: Fair elevated pain, limited ROM and tolerance to activity  CLINICAL DECISION MAKING: Evolving/moderate complexity  EVALUATION COMPLEXITY: Moderate   GOALS: Goals reviewed with patient? Yes  SHORT TERM GOALS: Target date: 04/12/2023  Independent with initial HEP Goal status: on going 03/17/2023  2.  Lt knee AROM 5-60 deg for improved function Goal status: on going 03/17/2023  LONG TERM GOALS: Target date: 05/10/2023  Independent with final HEP Goal status: ongoing 03/24/2023  2.  FOTO score improved to 49 Goal status: ongoing 03/24/2023  3.  Lt knee AROM improved to 0-90 for improved mobility Goal status: ongoing 03/24/2023  4.  Report pain < 5/10 with standing and walking for improved function Goal status: ongoing 03/24/2023  5.  Demonstrate ability to amb with LRAD modified independent for improved mobility Goal status: ongoing 03/24/2023  6.  Perform work simulated tasks for improved function  Goal status: ongoing 03/24/2023     PLAN:  PT FREQUENCY:  2-3x/wk  PT DURATION: 8 weeks  PLANNED INTERVENTIONS: Therapeutic exercises, Therapeutic activity, Neuromuscular re-education, Balance training, Gait training, Patient/Family education, Self Care, Joint mobilization, Stair training, DME instructions, Aquatic Therapy, Dry Needling, Electrical stimulation, Cryotherapy, Moist heat, Taping, Vasopneumatic device, Manual therapy, and Re-evaluation  PLAN FOR NEXT SESSION: continue to work on flexion as able, strengthening, stretching, gait training     Clarita Crane, PT, DPT 04/09/23 8:52 AM

## 2023-04-12 ENCOUNTER — Encounter: Payer: BC Managed Care – PPO | Admitting: Physical Therapy

## 2023-04-14 ENCOUNTER — Other Ambulatory Visit: Payer: Self-pay | Admitting: Internal Medicine

## 2023-04-14 ENCOUNTER — Encounter: Payer: BC Managed Care – PPO | Admitting: Physical Therapy

## 2023-04-14 ENCOUNTER — Encounter: Payer: Self-pay | Admitting: Physician Assistant

## 2023-04-15 ENCOUNTER — Other Ambulatory Visit: Payer: Self-pay | Admitting: Orthopaedic Surgery

## 2023-04-15 ENCOUNTER — Ambulatory Visit (INDEPENDENT_AMBULATORY_CARE_PROVIDER_SITE_OTHER): Payer: BC Managed Care – PPO | Admitting: Rehabilitative and Restorative Service Providers"

## 2023-04-15 ENCOUNTER — Encounter: Payer: Self-pay | Admitting: Rehabilitative and Restorative Service Providers"

## 2023-04-15 DIAGNOSIS — M6281 Muscle weakness (generalized): Secondary | ICD-10-CM

## 2023-04-15 DIAGNOSIS — M25562 Pain in left knee: Secondary | ICD-10-CM

## 2023-04-15 DIAGNOSIS — R2689 Other abnormalities of gait and mobility: Secondary | ICD-10-CM | POA: Diagnosis not present

## 2023-04-15 DIAGNOSIS — R600 Localized edema: Secondary | ICD-10-CM

## 2023-04-15 DIAGNOSIS — M25662 Stiffness of left knee, not elsewhere classified: Secondary | ICD-10-CM | POA: Diagnosis not present

## 2023-04-15 DIAGNOSIS — M24662 Ankylosis, left knee: Secondary | ICD-10-CM

## 2023-04-15 DIAGNOSIS — R2681 Unsteadiness on feet: Secondary | ICD-10-CM

## 2023-04-15 MED ORDER — HYDROMORPHONE HCL 2 MG PO TABS: 2.0000 mg | ORAL_TABLET | Freq: Four times a day (QID) | ORAL | 0 refills | Status: DC | PRN

## 2023-04-15 NOTE — Therapy (Signed)
OUTPATIENT PHYSICAL THERAPY TREATMENT /RE-EVAL/Cert   Patient Name: [name] MRN: [id number] DOB:[birth year], 62 y.o., female Today's Date: 04/15/2023  Progress Note Reporting Period 03/15/2023 to 04/15/2023  See note below for Objective Data and Assessment of Progress/Goals.    END OF SESSION:  PT End of Session - 04/15/23 1601     Visit Number 13    Number of Visits 28    Date for PT Re-Evaluation 06/10/23    Authorization Type [employer] State $72 copay    Progress Note Due on Visit 23    PT Start Time 1555    PT Stop Time 1630    PT Time Calculation (min) 35 min    Activity Tolerance Patient limited by pain;Patient limited by fatigue    Behavior During Therapy [facility] for tasks assessed/performed                    Past Medical History:  Diagnosis Date   Arthritis    Campylobacter enteritis May 2012   [facility] admission   Carpal tunnel syndrome, bilateral    Diabetes mellitus    Gastritis with bleeding due to alcohol 2010   [facility] admission   GERD (gastroesophageal reflux disease)    Hyperlipidemia    Hypertension    no meds   Menopause    Myocardial infarction [facility])    Neck pain    Wears glasses    Past Surgical History:  Procedure Laterality Date   CARPAL TUNNEL RELEASE Left 05/14/2014   Procedure: LEFT ULNAR NEUROPLASTY AT ELBOW AND ENDOSCOPIC CARPAL TUNNEL RELEASE;  Surgeon: Jodi Marble, MD;  Location: [facility];  Service: Orthopedics;  Laterality: Left;   CORONARY STENT INTERVENTION N/A 10/09/2019   Procedure: CORONARY STENT INTERVENTION;  Surgeon: Yates Decamp, MD;  Location: [facility];  Service: Cardiovascular;  Laterality: N/A;   CYST EXCISION  1995   under arms   endometrial biopsy  2004   benign   FOOT OSTEOTOMY  2010   right-spurs   LEFT HEART CATH AND CORONARY ANGIOGRAPHY N/A 10/09/2019   Procedure: LEFT HEART CATH AND CORONARY ANGIOGRAPHY;  Surgeon: Yates Decamp, MD;  Location: [facility];  Service:  Cardiovascular;  Laterality: N/A;   TONSILLECTOMY     TOTAL KNEE ARTHROPLASTY Left 02/26/2023   Procedure: LEFT TOTAL KNEE ARTHROPLASTY;  Surgeon: Kathryne Hitch, MD;  Location: [facility];  Service: Orthopedics;  Laterality: Left;  Needs RNFA   ULNAR NERVE TRANSPOSITION Left 05/14/2014   Procedure: ULNAR NERVE DECOMPRESSION/TRANSPOSITION;  Surgeon: Jodi Marble, MD;  Location: [facility];  Service: Orthopedics;  Laterality: Left;   Patient Active Problem List   Diagnosis Date Noted   Arthrofibrosis of total knee arthroplasty, subsequent encounter 04/07/2023   Status post left knee replacement 02/26/2023   Unilateral primary osteoarthritis, left knee 09/09/2022   Intractable nausea and vomiting 07/04/2022   Hematemesis 07/04/2022   Hypokalemia 07/04/2022   Lactic acidosis 07/04/2022   Pain due to onychomycosis of toenails of both feet 04/28/2022   Diabetic neuropathy, type II diabetes mellitus (HCC) 04/28/2022   De Quervain's tenosynovitis, left 11/13/2021   Bilateral injections given May 21, 2020 degenerative arthritis of knee, bilateral 05/21/2020   Traumatic hematoma of female breast 11/29/2019   Dizziness 11/29/2019   Hospital discharge follow-up 10/23/2019   NSTEMI (non-ST elevated myocardial infarction) (HCC) 10/10/2019   CAD (coronary artery disease) 10/10/2019   Chest pain 10/09/2019   Elevated troponin 10/09/2019   Uncontrolled  type 2 diabetes mellitus with hyperglycemia (HCC) 10/09/2019   Obesity (BMI 30.0-34.9) 10/09/2019   Cervical spondylitis with radiculitis (HCC) 08/29/2019   Acute bursitis of left shoulder 01/30/2019   Chronic midline posterior neck pain 06/28/2018   Urgency incontinence 06/28/2018   Degenerative joint disease of knee, left 04/26/2018   Carpal tunnel syndrome on both sides 03/29/2018   Anemia 01/02/2018   Class 1 obesity with serious comorbidity and body mass index (BMI) of 32.0 to 32.9 in adult 07/23/2017   Left hip pain  05/04/2017   Carpal tunnel syndrome on right 12/27/2016   Polyarthralgia 11/09/2016   Uncontrolled type 2 diabetes mellitus with retinopathy, with long-term current use of insulin 07/07/2016   Visit for preventive health examination 06/21/2016   Increased urinary frequency 06/21/2016   Chronic meniscal tear of knee 08/19/2015   Left knee pain 06/19/2015   Candidiasis of skin 06/18/2015   Genital herpes 07/31/2014   Ulnar nerve compression 09/21/2013   Noncompliance with diet and medication regimen 11/15/2012   Boil, axilla 08/14/2012   Arthritis    Screening for breast cancer 08/04/2011   Screening for colon cancer 08/04/2011   HIDRADENITIS SUPPURATIVA 04/11/2007   GERD 11/17/2006   ENDOMETRIAL POLYP 11/17/2006   LOW BACK PAIN 11/17/2006   FIBROIDS, UTERUS 11/16/2006   Hyperlipidemia associated with type 2 diabetes mellitus (HCC) 11/16/2006   Essential hypertension 11/16/2006    PCP: Georganna Skeans, MD  REFERRING PROVIDER: Kathryne Hitch, MD  REFERRING DIAG: [phone] (ICD-10-CM) - Status post left knee replacement   THERAPY DIAG:  Acute pain of left knee  Muscle weakness (generalized)  Stiffness of left knee, not elsewhere classified  Other abnormalities of gait and mobility  Unsteadiness on feet  Localized edema  Rationale for Evaluation and Treatment: Rehabilitation  ONSET DATE: 02/26/23  SUBJECTIVE:   SUBJECTIVE STATEMENT:  She indicated high level of pain and sore after manipulation today.  Reported knee pain and thigh pain.  She indicated feeling still out of it some.    PERTINENT HISTORY: OA, DM, HLD, HTN, MI s/p stent  PAIN:  NPRS scale: severe Pain location: Lt knee Pain description: throbbing Aggravating factors: constant, pressure, end range movements Relieving factors: repositioning, medications  PRECAUTIONS: Fall  WEIGHT BEARING RESTRICTIONS: No  FALLS:  Has patient fallen in last 6 months? Yes. 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[employer]; currently out on leave  PLOF: Independent and Leisure: fishing  PATIENT GOALS: improve mobility and pain  NEXT MD VISIT: Not scheduled  OBJECTIVE:   PATIENT SURVEYS:  04/05/23: FOTO 38 03/15/23: FOTO 24 (predicted 49)   COGNITION: Overall cognitive status: Within functional limits for tasks assessed     SENSATION: WFL  EDEMA:  03/15/23: Circumferential: Knee Joint Line: Rt: 34.5 cm/Lt: 39 cm   PALPATION: 03/15/23: very tender to light touch  LOWER EXTREMITY ROM:  ROM Left 03/15/23 Left 03/22/23 Left 03/24/23 Left 03/31/23 Left 04/05/23 Left 04/07/2023 After manual Left 04/09/23 [place] 04/15/2023  Knee flexion P: 42 A: 40 P: 69*  End of session P:  78* After manual supine AA: 82* A: 63 P: 70 Supine passive heel slide  88  A: 73 P: 82 67 AROM  In supine heel slide - limited by pain  Knee extension P: -  6 A: Unable  supine P: -5*  Supine Quad set -3* A: Seated LAQ: -15   -25 AROM in seated LAQ   -4 PROM in heel prop TKE supine   (Blank rows = not tested)  LOWER EXTREMITY MMT:    03/15/23: Not formally tested but grossly 2/5 Lt knee  MMT Left 03/29/2023 Left 04/15/2023  Knee flexion 4/5 NT  Knee extension 3+/5 in available range NT   (Blank rows = not tested)  FUNCTIONAL TESTS:  03/15/23:  increased difficulty rising from a chair; decreased gait velocity  GAIT: 04/15/2023:  Ambulation c FWW similar to initial arrival techniques.   04/02/2023:  SPC trials in clinic c SBA and cues for use and sequencing.  Antalgic gait noted but not that deviated from gait style c FWW.   03/15/23 Distance walked: 100' Assistive device utilized: Environmental consultant - 2 wheeled Level of assistance: SBA Comments: antalgic  gait, decreased speed   TODAY'S TREATMENT:                                                           DATE:   04/15/2023 Re-eval  Therex: Nustep Lvl 5 AROM to tolerance 10 mins Seated LAQ AROM x 10 Supine heel slide x 5 with assistance  Activity limited by pain and cognitive awareness s/p recent manipulation.   Vasopneumatic Device: 10 mins Lt knee in elevation medium compression 34 degrees.     TODAY'S TREATMENT:                                                           DATE:   04/09/23 Vitals: BP: 114/70; HR 99  TherEx NuStep L6 x 9 min ROM measurements - see above  Manual Use of percussive device with seated knee flexion to tolerance; LLLD stretching utilized with occasional rest breaks.  Vasopneumatic Left knee medium compression 10 min 34* with elevation  TODAY'S TREATMENT:                                                            DATE:   04/07/2023 Vitals upon arrival:  165/102 with 90 pulse   Therex: Supine LAQ in 90 deg hip flexion Lt leg x 10  Supine heel slide c strap Lt leg 10 sec hold x 8 Supine quad set 5 sec hold x 10   Manual: Seated Lt knee flexion mobilization c movement c IR/distraction/flexion.  Percussive device to Lt quad c sustained knee flexion hold.  Performed similar in supine positioning as well.    Vasopneumatic  left knee medium compression 10 min 34* with elevation  TODAY'S TREATMENT:  DATE:  04/05/23  TherEx UBE UE/LE L1 x 8 min; partial revolutions; seat 7 Seated LAQ 2x10 ROM measurements - see above      HOME EXERCISE PROGRAM: Access Code: [email or web address] URL: [email or web address] Date: 03/29/2023 Prepared by: Chyrel Masson  Exercises - Quad Set (Mirrored)  - 5-10 x daily - 7 x weekly - 1 sets - 5-10 reps - 5 sec hold - Supine Heel Slide with Strap  - 5-10 x daily - 7 x weekly - 1 sets - 5-10 reps - Seated Heel Slide (Mirrored)  - 5-10 x daily - 7 x weekly -  1 sets - 5-10 reps - Seated Quad Set (Mirrored)  - 5-10 x daily - 7 x weekly - 1 sets - 5-10 reps - 5 sec hold - Seated Long Arc Quad (Mirrored)  - 3-5 x daily - 7 x weekly - 1-2 sets - 10 reps - 2 hold - Seated Knee Flexion Extension AAROM with Overpressure (Mirrored)  - 5-6 x daily - 7 x weekly - 1 sets - 5 reps - 10 hold - Seated Straight Leg Heel Taps  - 1-2 x daily - 7 x weekly - 3 sets - 5-10 reps  ASSESSMENT: CLINICAL IMPRESSION: Pt returned to clinic today with re-eval performed due to medical status change due to manipulation performed today 04/15/2023.  Presentation of continued Lt knee pain c mobility, strength and movement coordination deficits which impact independent ambulation and functional activity from PLOF.  Continued skilled PT services warranted at this time.   OBJECTIVE IMPAIRMENTS: Abnormal gait, decreased balance, decreased endurance, decreased mobility, difficulty walking, decreased ROM, decreased strength, hypomobility, increased edema, increased fascial restrictions, increased muscle spasms, and pain.   ACTIVITY LIMITATIONS: carrying, lifting, bending, sitting, standing, squatting, sleeping, stairs, transfers, bed mobility, toileting, dressing, and locomotion level  PARTICIPATION LIMITATIONS: meal prep, cleaning, laundry, driving, shopping, community activity, and occupation  PERSONAL FACTORS: 3+ comorbidities: OA, DM, HLD, HTN, MI s/p stent  are also affecting patient's functional outcome.   REHAB POTENTIAL: Fair elevated pain, limited ROM and tolerance to activity  CLINICAL DECISION MAKING: Evolving/moderate complexity  EVALUATION COMPLEXITY: Moderate   GOALS: Goals reviewed with patient? Yes  SHORT TERM GOALS: Target date: 04/12/2023  Independent with initial HEP Goal status  Met  2.  Lt knee AROM 5-60 deg for improved function Goal status: mostly met  LONG TERM GOALS: Target date: 06/10/2023  Independent with final HEP Goal status: revised  04/15/2023  2.  FOTO score improved to 49 Goal status: revised 04/15/2023  3.  Lt knee AROM improved to 0-100 for improved mobility for daily activity  Goal status: revised 04/15/2023  4.  Report pain < 3/10 with standing and walking for improved function Goal status: revised 04/15/2023  5.  Demonstrate ability to amb with LRAD modified independent for improved mobility Goal status: revised 04/15/2023  6.  Patient will demonstrate/report ability to ascend/descend stairs with single hand rail and reciprocal gait pattern.  Goal status: revised 04/15/2023  7.  Patient will demonstrate Lt knee MMT 5/5 throughout to facilitate stairs, transfers and ambulation at PLOF.    PLAN:  PT FREQUENCY: every business day for 1 week post manip, then 2-3 x per week   PT DURATION: 8 weeks  PLANNED INTERVENTIONS: Therapeutic exercises, Therapeutic activity, Neuromuscular re-education, Balance training, Gait training, Patient/Family education, Self Care, Joint mobilization, Stair training, DME instructions, Aquatic Therapy, Dry Needling, Electrical stimulation, Cryotherapy, Moist heat, Taping, Vasopneumatic device, Manual therapy, and Re-evaluation  PLAN FOR NEXT SESSION:  Heavy mobility intervention focus for ROM gains in manual and exercise activity.    Chyrel Masson, PT, DPT, OCS, ATC 04/15/23  4:22 PM

## 2023-04-16 ENCOUNTER — Encounter: Payer: Self-pay | Admitting: Physical Therapy

## 2023-04-16 ENCOUNTER — Ambulatory Visit (INDEPENDENT_AMBULATORY_CARE_PROVIDER_SITE_OTHER): Payer: BC Managed Care – PPO | Admitting: Physical Therapy

## 2023-04-16 DIAGNOSIS — R2689 Other abnormalities of gait and mobility: Secondary | ICD-10-CM

## 2023-04-16 DIAGNOSIS — M25662 Stiffness of left knee, not elsewhere classified: Secondary | ICD-10-CM

## 2023-04-16 DIAGNOSIS — M6281 Muscle weakness (generalized): Secondary | ICD-10-CM | POA: Diagnosis not present

## 2023-04-16 DIAGNOSIS — R2681 Unsteadiness on feet: Secondary | ICD-10-CM

## 2023-04-16 DIAGNOSIS — R600 Localized edema: Secondary | ICD-10-CM

## 2023-04-16 DIAGNOSIS — M25562 Pain in left knee: Secondary | ICD-10-CM

## 2023-04-16 NOTE — Therapy (Signed)
OUTPATIENT PHYSICAL THERAPY TREATMENT   Patient Name: [name] MRN: [id number] DOB:08-May-1961, 62 y.o., female Today's Date: 04/16/2023    END OF SESSION:  PT End of Session - 04/16/23 0757     Visit Number 14    Number of Visits 28    Date for PT Re-Evaluation 06/10/23    Authorization Type [employer] State $72 copay    Progress Note Due on Visit 23    PT Start Time 0800    PT Stop Time 0852    PT Time Calculation (min) 52 min    Activity Tolerance Patient limited by pain;Patient limited by fatigue    Behavior During Therapy [facility] for tasks assessed/performed                     Past Medical History:  Diagnosis Date   Arthritis    Campylobacter enteritis May 2012   [facility] admission   Carpal tunnel syndrome, bilateral    Diabetes mellitus    Gastritis with bleeding due to alcohol 2010   [facility] admission   GERD (gastroesophageal reflux disease)    Hyperlipidemia    Hypertension    no meds   Menopause    Myocardial infarction [facility])    Neck pain    Wears glasses    Past Surgical History:  Procedure Laterality Date   CARPAL TUNNEL RELEASE Left 05/14/2014   Procedure: LEFT ULNAR NEUROPLASTY AT ELBOW AND ENDOSCOPIC CARPAL TUNNEL RELEASE;  Surgeon: Jodi Marble, MD;  Location: [facility];  Service: Orthopedics;  Laterality: Left;   CORONARY STENT INTERVENTION N/A 10/09/2019   Procedure: CORONARY STENT INTERVENTION;  Surgeon: Yates Decamp, MD;  Location: [facility];  Service: Cardiovascular;  Laterality: N/A;   CYST EXCISION  1995   under arms   endometrial biopsy  2004   benign   FOOT OSTEOTOMY  2010   right-spurs   LEFT HEART CATH AND CORONARY ANGIOGRAPHY N/A 10/09/2019   Procedure: LEFT HEART CATH AND CORONARY ANGIOGRAPHY;  Surgeon: Yates Decamp, MD;  Location: [facility];  Service: Cardiovascular;  Laterality: N/A;   TONSILLECTOMY     TOTAL KNEE ARTHROPLASTY Left 02/26/2023   Procedure: LEFT TOTAL KNEE ARTHROPLASTY;  Surgeon:  Kathryne Hitch, MD;  Location: [facility];  Service: Orthopedics;  Laterality: Left;  Needs RNFA   ULNAR NERVE TRANSPOSITION Left 05/14/2014   Procedure: ULNAR NERVE DECOMPRESSION/TRANSPOSITION;  Surgeon: Jodi Marble, MD;  Location: [facility];  Service: Orthopedics;  Laterality: Left;   Patient Active Problem List   Diagnosis Date Noted   Arthrofibrosis of total knee arthroplasty, subsequent encounter 04/07/2023   Status post left knee replacement 02/26/2023   Unilateral primary osteoarthritis, left knee 09/09/2022   Intractable nausea and vomiting 07/04/2022   Hematemesis 07/04/2022   Hypokalemia 07/04/2022   Lactic acidosis 07/04/2022   Pain due to onychomycosis of toenails of both feet 04/28/2022   Diabetic neuropathy, type II diabetes mellitus (HCC) 04/28/2022   De Quervain's tenosynovitis, left 11/13/2021   Bilateral injections given May 21, 2020 degenerative arthritis of knee, bilateral 05/21/2020   Traumatic hematoma of female breast 11/29/2019   Dizziness 11/29/2019   Hospital discharge follow-up 10/23/2019   NSTEMI (non-ST elevated myocardial infarction) (HCC) 10/10/2019   CAD (coronary artery disease) 10/10/2019   Chest pain 10/09/2019   Elevated troponin 10/09/2019   Uncontrolled type 2 diabetes mellitus with hyperglycemia (HCC) 10/09/2019   Obesity (BMI 30.0-34.9) 10/09/2019   Cervical spondylitis with  radiculitis (HCC) 08/29/2019   Acute bursitis of left shoulder 01/30/2019   Chronic midline posterior neck pain 06/28/2018   Urgency incontinence 06/28/2018   Degenerative joint disease of knee, left 04/26/2018   Carpal tunnel syndrome on both sides 03/29/2018   Anemia 01/02/2018   Class 1 obesity with serious comorbidity and body mass index (BMI) of 32.0 to 32.9 in adult 07/23/2017   Left hip pain 05/04/2017   Carpal tunnel syndrome on right 12/27/2016   Polyarthralgia 11/09/2016   Uncontrolled type 2 diabetes mellitus with retinopathy, with  long-term current use of insulin 07/07/2016   Visit for preventive health examination 06/21/2016   Increased urinary frequency 06/21/2016   Chronic meniscal tear of knee 08/19/2015   Left knee pain 06/19/2015   Candidiasis of skin 06/18/2015   Genital herpes 07/31/2014   Ulnar nerve compression 09/21/2013   Noncompliance with diet and medication regimen 11/15/2012   Boil, axilla 08/14/2012   Arthritis    Screening for breast cancer 08/04/2011   Screening for colon cancer 08/04/2011   HIDRADENITIS SUPPURATIVA 04/11/2007   GERD 11/17/2006   ENDOMETRIAL POLYP 11/17/2006   LOW BACK PAIN 11/17/2006   FIBROIDS, UTERUS 11/16/2006   Hyperlipidemia associated with type 2 diabetes mellitus (HCC) 11/16/2006   Essential hypertension 11/16/2006    PCP: Georganna Skeans, MD  REFERRING PROVIDER: Kathryne Hitch, MD  REFERRING DIAG: [phone] (ICD-10-CM) - Status post left knee replacement   THERAPY DIAG:  Acute pain of left knee  Muscle weakness (generalized)  Stiffness of left knee, not elsewhere classified  Other abnormalities of gait and mobility  Unsteadiness on feet  Localized edema  Rationale for Evaluation and Treatment: Rehabilitation  ONSET DATE: 02/26/23  SUBJECTIVE:   SUBJECTIVE STATEMENT:  feeling much better today     PERTINENT HISTORY: OA, DM, HLD, HTN, MI s/p stent  PAIN:  NPRS scale: 6-7/10 Pain location: Lt knee Pain description: throbbing Aggravating factors: constant, pressure, end range movements Relieving factors: repositioning, medications  PRECAUTIONS: Fall  WEIGHT BEARING RESTRICTIONS: No  FALLS:  Has patient fallen in last 6 months? Yes. 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[employer]; currently out on leave  PLOF: Independent and Leisure: fishing  PATIENT GOALS: improve mobility and pain  NEXT MD VISIT: Not scheduled  OBJECTIVE:   PATIENT SURVEYS:  04/05/23: FOTO 38 03/15/23: FOTO 24 (predicted 49)   COGNITION: Overall cognitive status: Within functional limits for tasks assessed     SENSATION: WFL  EDEMA:  03/15/23: Circumferential: Knee Joint Line: Rt: 34.5 cm/Lt: 39 cm   PALPATION: 03/15/23: very tender to light touch  LOWER EXTREMITY ROM:  ROM Left 03/15/23 Left 03/22/23 Left 03/24/23 Left 03/31/23 Left 04/05/23 Left 04/07/2023 After manual Left 04/09/23 [place] 04/15/2023 Left 04/16/23  Knee flexion P: 42 A: 40 P: 69*  End of session P:  78* After manual supine AA: 82* A: 63 P: 70 Supine passive heel slide  88  A: 73 P: 82 67 AROM  In supine heel slide - limited by pain A: 81 (supine heel slide)  Knee extension P: -6 A: Unable  supine P: -5*  Supine Quad set -3* A: Seated LAQ: -15   -25 AROM in seated LAQ   -4 PROM in heel prop TKE supine    (  Blank rows = not tested)  LOWER EXTREMITY MMT:    03/15/23: Not formally tested but grossly 2/5 Lt knee  MMT Left 03/29/2023 Left 04/15/2023  Knee flexion 4/5 NT  Knee extension 3+/5 in available range NT   (Blank rows = not tested)  FUNCTIONAL TESTS:  03/15/23:  increased difficulty rising from a chair; decreased gait velocity  GAIT: 04/15/2023:  Ambulation c FWW similar to initial arrival techniques.   04/02/2023:  SPC trials in clinic c SBA and cues for use and sequencing.  Antalgic gait noted but not that deviated from gait style c FWW.   03/15/23 Distance walked: 100' Assistive device utilized: Environmental consultant - 2 wheeled Level of assistance: SBA Comments: antalgic gait, decreased speed   TODAY'S TREATMENT:                                                           DATE:   04/16/2023 TherEx NuStep L5 x 10 min Seated AA Lt knee flexion 10 x 10 sec hold (RLE providing assist) Seated AA Lt LAQ  2x10; using strap to maximize ROM, cues for eccentric control Supine AA heel slides x 10 reps  Manual Supine Lt knee flexion to tolerance x 10 min   Vasopneumatic Device 10 mins Lt knee in elevation medium compression 34 degrees.    TODAY'S TREATMENT:                                                           DATE:   04/15/2023 Re-eval  Therex: Nustep Lvl 5 AROM to tolerance 10 mins Seated LAQ AROM x 10 Supine heel slide x 5 with assistance  Activity limited by pain and cognitive awareness s/p recent manipulation.   Vasopneumatic Device: 10 mins Lt knee in elevation medium compression 34 degrees.     TODAY'S TREATMENT:                                                           DATE:   04/09/23 Vitals: BP: 114/70; HR 99  TherEx NuStep L6 x 9 min ROM measurements - see above  Manual Use of percussive device with seated knee flexion to tolerance; LLLD stretching utilized with occasional rest breaks.  Vasopneumatic Left knee medium compression 10 min 34* with elevation  TODAY'S TREATMENT:                                                            DATE:   04/07/2023 Vitals upon arrival:  165/102 with 90 pulse   Therex: Supine LAQ in 90 deg hip flexion Lt leg x 10  Supine heel slide c strap Lt leg 10 sec hold x 8 Supine quad set 5 sec hold x 10   Manual: Seated  Lt knee flexion mobilization c movement c IR/distraction/flexion.  Percussive device to Lt quad c sustained knee flexion hold.  Performed similar in supine positioning as well.    Vasopneumatic  left knee medium compression 10 min 34* with elevation  TODAY'S TREATMENT:                                                            DATE:  04/05/23  TherEx UBE UE/LE L1 x 8 min; partial revolutions; seat 7 Seated LAQ 2x10 ROM measurements - see above      HOME EXERCISE PROGRAM: Access Code: [email or web address] URL: [email or web address] Date: 03/29/2023 Prepared by: Chyrel Masson  Exercises - Quad Set  (Mirrored)  - 5-10 x daily - 7 x weekly - 1 sets - 5-10 reps - 5 sec hold - Supine Heel Slide with Strap  - 5-10 x daily - 7 x weekly - 1 sets - 5-10 reps - Seated Heel Slide (Mirrored)  - 5-10 x daily - 7 x weekly - 1 sets - 5-10 reps - Seated Quad Set (Mirrored)  - 5-10 x daily - 7 x weekly - 1 sets - 5-10 reps - 5 sec hold - Seated Long Arc Quad (Mirrored)  - 3-5 x daily - 7 x weekly - 1-2 sets - 10 reps - 2 hold - Seated Knee Flexion Extension AAROM with Overpressure (Mirrored)  - 5-6 x daily - 7 x weekly - 1 sets - 5 reps - 10 hold - Seated Straight Leg Heel Taps  - 1-2 x daily - 7 x weekly - 3 sets - 5-10 reps  ASSESSMENT: CLINICAL IMPRESSION: Pt tolerated session well today with improvement in flexion today, and overall just feeling better today.  Continued skilled PT services warranted at this time.   OBJECTIVE IMPAIRMENTS: Abnormal gait, decreased balance, decreased endurance, decreased mobility, difficulty walking, decreased ROM, decreased strength, hypomobility, increased edema, increased fascial restrictions, increased muscle spasms, and pain.   ACTIVITY LIMITATIONS: carrying, lifting, bending, sitting, standing, squatting, sleeping, stairs, transfers, bed mobility, toileting, dressing, and locomotion level  PARTICIPATION LIMITATIONS: meal prep, cleaning, laundry, driving, shopping, community activity, and occupation  PERSONAL FACTORS: 3+ comorbidities: OA, DM, HLD, HTN, MI s/p stent  are also affecting patient's functional outcome.   REHAB POTENTIAL: Fair elevated pain, limited ROM and tolerance to activity  CLINICAL DECISION MAKING: Evolving/moderate complexity  EVALUATION COMPLEXITY: Moderate   GOALS: Goals reviewed with patient? Yes  SHORT TERM GOALS: Target date: 04/12/2023  Independent with initial HEP Goal status  Met  2.  Lt knee AROM 5-60 deg for improved function Goal status: mostly met  LONG TERM GOALS: Target date: 06/10/2023  Independent with final  HEP Goal status: revised 04/15/2023  2.  FOTO score improved to 49 Goal status: revised 04/15/2023  3.  Lt knee AROM improved to 0-100 for improved mobility for daily activity  Goal status: revised 04/15/2023  4.  Report pain < 3/10 with standing and walking for improved function Goal status: revised 04/15/2023  5.  Demonstrate ability to amb with LRAD modified independent for improved mobility Goal status: revised 04/15/2023  6.  Patient will demonstrate/report ability to ascend/descend stairs with single hand rail and reciprocal gait pattern.  Goal status: revised 04/15/2023  7.  Patient will demonstrate Lt knee MMT 5/5 throughout to  facilitate stairs, transfers and ambulation at PLOF.    PLAN:  PT FREQUENCY: every business day for 1 week post manip, then 2-3 x per week   PT DURATION: 8 weeks  PLANNED INTERVENTIONS: Therapeutic exercises, Therapeutic activity, Neuromuscular re-education, Balance training, Gait training, Patient/Family education, Self Care, Joint mobilization, Stair training, DME instructions, Aquatic Therapy, Dry Needling, Electrical stimulation, Cryotherapy, Moist heat, Taping, Vasopneumatic device, Manual therapy, and Re-evaluation  PLAN FOR NEXT SESSION: continue heavy mobility intervention focus for ROM gains in manual and exercise activity.     Clarita Crane, PT, DPT 04/16/23 8:46 AM

## 2023-04-19 ENCOUNTER — Encounter: Payer: Self-pay | Admitting: Physical Therapy

## 2023-04-19 ENCOUNTER — Ambulatory Visit (INDEPENDENT_AMBULATORY_CARE_PROVIDER_SITE_OTHER): Payer: BC Managed Care – PPO | Admitting: Physical Therapy

## 2023-04-19 ENCOUNTER — Ambulatory Visit: Payer: BC Managed Care – PPO | Admitting: Podiatry

## 2023-04-19 DIAGNOSIS — R2689 Other abnormalities of gait and mobility: Secondary | ICD-10-CM

## 2023-04-19 DIAGNOSIS — M25562 Pain in left knee: Secondary | ICD-10-CM

## 2023-04-19 DIAGNOSIS — M6281 Muscle weakness (generalized): Secondary | ICD-10-CM | POA: Diagnosis not present

## 2023-04-19 DIAGNOSIS — M25662 Stiffness of left knee, not elsewhere classified: Secondary | ICD-10-CM | POA: Diagnosis not present

## 2023-04-19 DIAGNOSIS — R600 Localized edema: Secondary | ICD-10-CM

## 2023-04-19 DIAGNOSIS — R2681 Unsteadiness on feet: Secondary | ICD-10-CM

## 2023-04-19 NOTE — Therapy (Signed)
OUTPATIENT PHYSICAL THERAPY TREATMENT   Patient Name: [name] MRN: [id number] DOB:[birth year], 62 y.o., female Today's Date: 04/19/2023    END OF SESSION:  PT End of Session - 04/19/23 0806     Visit Number 15    Number of Visits 28    Date for PT Re-Evaluation 06/10/23    Authorization Type [employer] State $72 copay    Progress Note Due on Visit 23    PT Start Time 0800    PT Stop Time 0840    PT Time Calculation (min) 40 min    Activity Tolerance Patient limited by pain    Behavior During Therapy [facility] for tasks assessed/performed                     Past Medical History:  Diagnosis Date   Arthritis    Campylobacter enteritis May 2012   [facility] admission   Carpal tunnel syndrome, bilateral    Diabetes mellitus    Gastritis with bleeding due to alcohol 2010   [facility] admission   GERD (gastroesophageal reflux disease)    Hyperlipidemia    Hypertension    no meds   Menopause    Myocardial infarction (HCC)    Neck pain    Wears glasses    Past Surgical History:  Procedure Laterality Date   CARPAL TUNNEL RELEASE Left 05/14/2014   Procedure: LEFT ULNAR NEUROPLASTY AT ELBOW AND ENDOSCOPIC CARPAL TUNNEL RELEASE;  Surgeon: Jodi Marble, MD;  Location: [facility];  Service: Orthopedics;  Laterality: Left;   CORONARY STENT INTERVENTION N/A 10/09/2019   Procedure: CORONARY STENT INTERVENTION;  Surgeon: Yates Decamp, MD;  Location: [facility];  Service: Cardiovascular;  Laterality: N/A;   CYST EXCISION  1995   under arms   endometrial biopsy  2004   benign   FOOT OSTEOTOMY  2010   right-spurs   LEFT HEART CATH AND CORONARY ANGIOGRAPHY N/A 10/09/2019   Procedure: LEFT HEART CATH AND CORONARY ANGIOGRAPHY;  Surgeon: Yates Decamp, MD;  Location: [facility];  Service: Cardiovascular;  Laterality: N/A;   TONSILLECTOMY     TOTAL KNEE ARTHROPLASTY Left 02/26/2023   Procedure: LEFT TOTAL KNEE ARTHROPLASTY;  Surgeon: Kathryne Hitch, MD;   Location: [facility];  Service: Orthopedics;  Laterality: Left;  Needs RNFA   ULNAR NERVE TRANSPOSITION Left 05/14/2014   Procedure: ULNAR NERVE DECOMPRESSION/TRANSPOSITION;  Surgeon: Jodi Marble, MD;  Location:  SURGERY CENTER;  Service: Orthopedics;  Laterality: Left;   Patient Active Problem List   Diagnosis Date Noted   Arthrofibrosis of total knee arthroplasty, subsequent encounter 04/07/2023   Status post left knee replacement 02/26/2023   Unilateral primary osteoarthritis, left knee 09/09/2022   Intractable nausea and vomiting 07/04/2022   Hematemesis 07/04/2022   Hypokalemia 07/04/2022   Lactic acidosis 07/04/2022   Pain due to onychomycosis of toenails of both feet 04/28/2022   Diabetic neuropathy, type II diabetes mellitus (HCC) 04/28/2022   De Quervain's tenosynovitis, left 11/13/2021   Bilateral injections given May 21, 2020 degenerative arthritis of knee, bilateral 05/21/2020   Traumatic hematoma of female breast 11/29/2019   Dizziness 11/29/2019   Hospital discharge follow-up 10/23/2019   NSTEMI (non-ST elevated myocardial infarction) (HCC) 10/10/2019   CAD (coronary artery disease) 10/10/2019   Chest pain 10/09/2019   Elevated troponin 10/09/2019   Uncontrolled type 2 diabetes mellitus with hyperglycemia (HCC) 10/09/2019   Obesity (BMI 30.0-34.9) 10/09/2019   Cervical spondylitis with radiculitis (HCC) 08/29/2019  Acute bursitis of left shoulder 01/30/2019   Chronic midline posterior neck pain 06/28/2018   Urgency incontinence 06/28/2018   Degenerative joint disease of knee, left 04/26/2018   Carpal tunnel syndrome on both sides 03/29/2018   Anemia 01/02/2018   Class 1 obesity with serious comorbidity and body mass index (BMI) of 32.0 to 32.9 in adult 07/23/2017   Left hip pain 05/04/2017   Carpal tunnel syndrome on right 12/27/2016   Polyarthralgia 11/09/2016   Uncontrolled type 2 diabetes mellitus with retinopathy, with long-term current use of  insulin 07/07/2016   Visit for preventive health examination 06/21/2016   Increased urinary frequency 06/21/2016   Chronic meniscal tear of knee 08/19/2015   Left knee pain 06/19/2015   Candidiasis of skin 06/18/2015   Genital herpes 07/31/2014   Ulnar nerve compression 09/21/2013   Noncompliance with diet and medication regimen 11/15/2012   Boil, axilla 08/14/2012   Arthritis    Screening for breast cancer 08/04/2011   Screening for colon cancer 08/04/2011   HIDRADENITIS SUPPURATIVA 04/11/2007   GERD 11/17/2006   ENDOMETRIAL POLYP 11/17/2006   LOW BACK PAIN 11/17/2006   FIBROIDS, UTERUS 11/16/2006   Hyperlipidemia associated with type 2 diabetes mellitus (HCC) 11/16/2006   Essential hypertension 11/16/2006    PCP: Georganna Skeans, MD  REFERRING PROVIDER: Kathryne Hitch, MD  REFERRING DIAG: [phone] (ICD-10-CM) - Status post left knee replacement   THERAPY DIAG:  Acute pain of left knee  Muscle weakness (generalized)  Stiffness of left knee, not elsewhere classified  Other abnormalities of gait and mobility  Unsteadiness on feet  Localized edema  Rationale for Evaluation and Treatment: Rehabilitation  ONSET DATE: 02/26/23  SUBJECTIVE:   SUBJECTIVE STATEMENT:  stating her knee is really hurting today, she has been unable to work much on the exercises at home due to pain.    PERTINENT HISTORY: OA, DM, HLD, HTN, MI s/p stent  PAIN:  NPRS scale: 10/10 Pain location: Lt knee Pain description: throbbing Aggravating factors: constant, pressure, end range movements Relieving factors: repositioning, medications  PRECAUTIONS: Fall  WEIGHT BEARING RESTRICTIONS: No  FALLS:  Has patient fallen in last 6 months? Yes. 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[employer]; currently out on leave  PLOF: Independent and Leisure: fishing  PATIENT GOALS: improve mobility and pain  NEXT MD VISIT: Not scheduled  OBJECTIVE:   PATIENT SURVEYS:  04/05/23: FOTO 38 03/15/23: FOTO 24 (predicted 49)   COGNITION: Overall cognitive status: Within functional limits for tasks assessed     SENSATION: WFL  EDEMA:  03/15/23: Circumferential: Knee Joint Line: Rt: 34.5 cm/Lt: 39 cm   PALPATION: 03/15/23: very tender to light touch  LOWER EXTREMITY ROM:  ROM Left 03/15/23 Left 03/22/23 Left 03/24/23 Left 03/31/23 Left 04/05/23 Left 04/07/2023 After manual Left 04/09/23 [place] 04/15/2023 Left 04/16/23  Knee flexion P: 42 A: 40 P: 69*  End of session P:  78* After manual supine AA: 82* A: 63 P: 70 Supine passive heel slide  88  A: 73 P: 82 67 AROM  In supine heel slide - limited by pain A: 81 (supine heel slide)  Knee extension P: -6 A: Unable  supine P: -5*  Supine Quad set -3* A: Seated LAQ: -15   -25 AROM in seated  LAQ   -4 PROM in heel prop TKE supine    (Blank rows = not tested)  LOWER EXTREMITY MMT:    03/15/23: Not formally tested but grossly 2/5 Lt knee  MMT Left 03/29/2023 Left 04/15/2023  Knee flexion 4/5 NT  Knee extension 3+/5 in available range NT   (Blank rows = not tested)  FUNCTIONAL TESTS:  03/15/23:  increased difficulty rising from a chair; decreased gait velocity  GAIT: 04/15/2023:  Ambulation c FWW similar to initial arrival techniques.   04/02/2023:  SPC trials in clinic c SBA and cues for use and sequencing.  Antalgic gait noted but not that deviated from gait style c FWW.   03/15/23 Distance walked: 100' Assistive device utilized: Environmental consultant - 2 wheeled Level of assistance: SBA Comments: antalgic gait, decreased speed   TODAY'S TREATMENT:                                                           DATE:    04/19/2023 TherEx NuStep L5 x 10 min Seated AA Lt knee flexion  5x 5 sec hold (RLE providing assist) unable to tolerate any more or longer today Seated LAQ X 10 on left Supine AA heel slides x 10 reps Supine quad sets X 10 Attempted supine hamstring stretch but unable due to pain.   Vasopneumatic Device 10 mins Lt knee in elevation medium compression 34 degrees.  04/16/2023 TherEx NuStep L5 x 10 min Seated AA Lt knee flexion 10 x 10 sec hold (RLE providing assist) Seated AA Lt LAQ 2x10; using strap to maximize ROM, cues for eccentric control Supine AA heel slides x 10 reps  Manual Supine Lt knee flexion to tolerance x 10 min   Vasopneumatic Device 10 mins Lt knee in elevation medium compression 34 degrees.    TODAY'S TREATMENT:                                                           DATE:   04/15/2023 Re-eval  Therex: Nustep Lvl 5 AROM to tolerance 10 mins Seated LAQ AROM x 10 Supine heel slide x 5 with assistance  Activity limited by pain and cognitive awareness s/p recent manipulation.   Vasopneumatic Device: 10 mins Lt knee in elevation medium compression 34 degrees.     TODAY'S TREATMENT:                                                           DATE:   04/09/23 Vitals: BP: 114/70; HR 99  TherEx NuStep L6 x 9 min ROM measurements - see above  Manual Use of percussive device with seated knee flexion to tolerance; LLLD stretching utilized with occasional rest breaks.  Vasopneumatic Left knee medium compression 10 min 34* with elevation  TODAY'S TREATMENT:  DATE:   04/07/2023 Vitals upon arrival:  165/102 with 90 pulse   Therex: Supine LAQ in 90 deg hip flexion Lt leg x 10  Supine heel slide c strap Lt leg 10 sec hold x 8 Supine quad set 5 sec hold x 10   Manual: Seated Lt knee flexion mobilization c movement c IR/distraction/flexion.  Percussive device to Lt quad c sustained knee flexion hold.  Performed similar in supine positioning as well.     Vasopneumatic  left knee medium compression 10 min 34* with elevation  TODAY'S TREATMENT:                                                            DATE:  04/05/23  TherEx UBE UE/LE L1 x 8 min; partial revolutions; seat 7 Seated LAQ 2x10 ROM measurements - see above      HOME EXERCISE PROGRAM: Access Code: [email or web address] URL: [email or web address] Date: 03/29/2023 Prepared by: Chyrel Masson  Exercises - Quad Set (Mirrored)  - 5-10 x daily - 7 x weekly - 1 sets - 5-10 reps - 5 sec hold - Supine Heel Slide with Strap  - 5-10 x daily - 7 x weekly - 1 sets - 5-10 reps - Seated Heel Slide (Mirrored)  - 5-10 x daily - 7 x weekly - 1 sets - 5-10 reps - Seated Quad Set (Mirrored)  - 5-10 x daily - 7 x weekly - 1 sets - 5-10 reps - 5 sec hold - Seated Long Arc Quad (Mirrored)  - 3-5 x daily - 7 x weekly - 1-2 sets - 10 reps - 2 hold - Seated Knee Flexion Extension AAROM with Overpressure (Mirrored)  - 5-6 x daily - 7 x weekly - 1 sets - 5 reps - 10 hold - Seated Straight Leg Heel Taps  - 1-2 x daily - 7 x weekly - 3 sets - 5-10 reps  ASSESSMENT: CLINICAL IMPRESSION: Session limited by pain and knee stiffness. She had very low tolerance for the exercises today and ROM for her knee. I did use vasopnuematic today combined with elevation in attempts to decrease her 10/10 pain she reports today. Continued skilled PT services warranted at this time.   OBJECTIVE IMPAIRMENTS: Abnormal gait, decreased balance, decreased endurance, decreased mobility, difficulty walking, decreased ROM, decreased strength, hypomobility, increased edema, increased fascial restrictions, increased muscle spasms, and pain.   ACTIVITY LIMITATIONS: carrying, lifting, bending, sitting, standing, squatting, sleeping, stairs, transfers, bed mobility, toileting, dressing, and locomotion level  PARTICIPATION LIMITATIONS: meal prep, cleaning, laundry, driving, shopping, community activity, and occupation  PERSONAL  FACTORS: 3+ comorbidities: OA, DM, HLD, HTN, MI s/p stent  are also affecting patient's functional outcome.   REHAB POTENTIAL: Fair elevated pain, limited ROM and tolerance to activity  CLINICAL DECISION MAKING: Evolving/moderate complexity  EVALUATION COMPLEXITY: Moderate   GOALS: Goals reviewed with patient? Yes  SHORT TERM GOALS: Target date: 04/12/2023  Independent with initial HEP Goal status  Met  2.  Lt knee AROM 5-60 deg for improved function Goal status: mostly met  LONG TERM GOALS: Target date: 06/10/2023  Independent with final HEP Goal status: revised 04/15/2023  2.  FOTO score improved to 49 Goal status: revised 04/15/2023  3.  Lt knee AROM improved to 0-100 for improved mobility for daily activity  Goal status:  revised 04/15/2023  4.  Report pain < 3/10 with standing and walking for improved function Goal status: revised 04/15/2023  5.  Demonstrate ability to amb with LRAD modified independent for improved mobility Goal status: revised 04/15/2023  6.  Patient will demonstrate/report ability to ascend/descend stairs with single hand rail and reciprocal gait pattern.  Goal status: revised 04/15/2023  7.  Patient will demonstrate Lt knee MMT 5/5 throughout to facilitate stairs, transfers and ambulation at PLOF.    PLAN:  PT FREQUENCY: every business day for 1 week post manip, then 2-3 x per week   PT DURATION: 8 weeks  PLANNED INTERVENTIONS: Therapeutic exercises, Therapeutic activity, Neuromuscular re-education, Balance training, Gait training, Patient/Family education, Self Care, Joint mobilization, Stair training, DME instructions, Aquatic Therapy, Dry Needling, Electrical stimulation, Cryotherapy, Moist heat, Taping, Vasopneumatic device, Manual therapy, and Re-evaluation  PLAN FOR NEXT SESSION: continue heavy mobility intervention focus for ROM gains in manual and exercise activity.   Ivery Quale, PT, DPT 04/19/23 8:31 AM

## 2023-04-20 ENCOUNTER — Ambulatory Visit: Payer: Self-pay | Admitting: *Deleted

## 2023-04-20 ENCOUNTER — Ambulatory Visit: Payer: BC Managed Care – PPO | Admitting: Physician Assistant

## 2023-04-20 ENCOUNTER — Encounter: Payer: Self-pay | Admitting: Rehabilitative and Restorative Service Providers"

## 2023-04-20 ENCOUNTER — Encounter: Payer: Self-pay | Admitting: Physician Assistant

## 2023-04-20 ENCOUNTER — Ambulatory Visit (INDEPENDENT_AMBULATORY_CARE_PROVIDER_SITE_OTHER): Payer: BC Managed Care – PPO | Admitting: Rehabilitative and Restorative Service Providers"

## 2023-04-20 VITALS — BP 133/85 | HR 107 | Ht 64.0 in | Wt 155.0 lb

## 2023-04-20 DIAGNOSIS — M25562 Pain in left knee: Secondary | ICD-10-CM

## 2023-04-20 DIAGNOSIS — R600 Localized edema: Secondary | ICD-10-CM

## 2023-04-20 DIAGNOSIS — R2681 Unsteadiness on feet: Secondary | ICD-10-CM

## 2023-04-20 DIAGNOSIS — R2689 Other abnormalities of gait and mobility: Secondary | ICD-10-CM

## 2023-04-20 DIAGNOSIS — M25662 Stiffness of left knee, not elsewhere classified: Secondary | ICD-10-CM | POA: Diagnosis not present

## 2023-04-20 DIAGNOSIS — I252 Old myocardial infarction: Secondary | ICD-10-CM

## 2023-04-20 DIAGNOSIS — M6281 Muscle weakness (generalized): Secondary | ICD-10-CM

## 2023-04-20 DIAGNOSIS — R110 Nausea: Secondary | ICD-10-CM

## 2023-04-20 DIAGNOSIS — I10 Essential (primary) hypertension: Secondary | ICD-10-CM | POA: Diagnosis not present

## 2023-04-20 DIAGNOSIS — M79602 Pain in left arm: Secondary | ICD-10-CM | POA: Diagnosis not present

## 2023-04-20 MED ORDER — ONDANSETRON 8 MG PO TBDP
8.0000 mg | ORAL_TABLET | Freq: Three times a day (TID) | ORAL | 0 refills | Status: DC | PRN
Start: 2023-04-20 — End: 2023-05-31

## 2023-04-20 NOTE — Progress Notes (Unsigned)
Established Patient Office Visit  Subjective   Patient ID: [name], female    DOB: 1961-10-25  Age: 62 y.o. MRN: [id number]  No chief complaint on file.   States that she has been experiencing left arm pain for the past States that it radiates to her jaw and neck  Upper arm -  Right arm hurts a l On and off - Tiger balm -  Sleeps on both side - cuz toss and turn due to left knee replacement   Previous nstemi - 3 stents  Nausea -  - since knee replacment - 02/26/23 -  Last week had to have knee bent  Not eating much - low appetitie     Now taking hydrocodone - was taking oxycotnin - taking every 6 hours   - once a week oxempic - 6 months   Hard to tell if she is having chest pain or SOB, but having nausea Drinking fluids -     Past Medical History:  Diagnosis Date   Arthritis    Campylobacter enteritis May 2012   [facility] admission   Carpal tunnel syndrome, bilateral    Diabetes mellitus    Gastritis with bleeding due to alcohol 2010   [facility] admission   GERD (gastroesophageal reflux disease)    Hyperlipidemia    Hypertension    no meds   Menopause    Myocardial infarction (HCC)    Neck pain    Wears glasses    Social History   Socioeconomic History   Marital status: Single    Spouse name: Not on file   Number of children: 2   Years of education: 12   Highest education level: High school graduate  Occupational History   Not on file  Tobacco Use   Smoking status: Never   Smokeless tobacco: Never  Vaping Use   Vaping Use: Never used  Substance and Sexual Activity   Alcohol use: Not Currently   Drug use: Yes    Types: Marijuana    Comment: sometimes   Sexual activity: Yes    Birth control/protection: None    Comment: same partner x 10 years  Other Topics Concern   Not on file  Social History Narrative   She works as a Arboriculturist.   Right-handed.   One cup caffeine per day.   She lives at home with daughter.   Social Determinants of  Health   Financial Resource Strain: Not on file  Food Insecurity: No Food Insecurity (02/26/2023)   Hunger Vital Sign    Worried About Running Out of Food in the Last Year: Never true    Ran Out of Food in the Last Year: Never true  Transportation Needs: No Transportation Needs (02/26/2023)   PRAPARE - Administrator, Civil Service (Medical): No    Lack of Transportation (Non-Medical): No  Physical Activity: Not on file  Stress: Not on file  Social Connections: Not on file  Intimate Partner Violence: Not At Risk (02/26/2023)   Humiliation, Afraid, Rape, and Kick questionnaire    Fear of Current or Ex-Partner: No    Emotionally Abused: No    Physically Abused: No    Sexually Abused: No   Family History  Problem Relation Age of Onset   Hypertension Mother    Diabetes Mother    Heart attack Mother    Stroke Father    Diabetes Father    Diabetes Maternal Grandmother    Hypertension Maternal Grandmother  Heart attack Sister    Allergies  Allergen Reactions   Oxycodone-Acetaminophen Other (See Comments)    Causes her to feel "hot".   Penicillins Rash    Did it involve swelling of the face/tongue/throat, SOB, or low BP? No Did it involve sudden or severe rash/hives, skin peeling, or any reaction on the inside of your mouth or nose? No Did you need to seek medical attention at a hospital or doctor's office? No When did it last happen?       If all above answers are "NO", may proceed with cephalosporin use.    ROS    Objective:     BP 133/85 (BP Location: Left Arm, Patient Position: Sitting, Cuff Size: Normal)   Pulse (!) 107   Ht 5\' 4"  (1.626 m)   Wt 155 lb (70.3 kg)   SpO2 100%   BMI 26.61 kg/m  BP Readings from Last 3 Encounters:  04/20/23 133/85  04/05/23 (!) 158/103  03/01/23 121/71   Wt Readings from Last 3 Encounters:  04/20/23 155 lb (70.3 kg)  02/26/23 174 lb 13.2 oz (79.3 kg)  02/25/23 166 lb (75.3 kg)      Physical Exam      Assessment & Plan:   Problem List Items Addressed This Visit       Cardiovascular and Mediastinum   Essential hypertension   Other Visit Diagnoses     Left arm pain    -  Primary   Relevant Orders   EKG 12-Lead   History of non-ST elevation myocardial infarction (NSTEMI)           No follow-ups on file.    Kasandra Knudsen Mayers, PA-C

## 2023-04-20 NOTE — Therapy (Signed)
OUTPATIENT PHYSICAL THERAPY TREATMENT   Patient Name: [name] MRN: [id number] DOB:[birth year], 62 y.o., female Today's Date: 04/20/2023    END OF SESSION:  PT End of Session - 04/20/23 1014     Visit Number 16    Number of Visits 28    Date for PT Re-Evaluation 06/10/23    Authorization Type [employer] State $72 copay    Progress Note Due on Visit 23    PT Start Time 1003    PT Stop Time 1045    PT Time Calculation (min) 42 min    Activity Tolerance Patient limited by pain    Behavior During Therapy [facility] for tasks assessed/performed                      Past Medical History:  Diagnosis Date   Arthritis    Campylobacter enteritis May 2012   [facility] admission   Carpal tunnel syndrome, bilateral    Diabetes mellitus    Gastritis with bleeding due to alcohol 2010   [facility] admission   GERD (gastroesophageal reflux disease)    Hyperlipidemia    Hypertension    no meds   Menopause    Myocardial infarction (HCC)    Neck pain    Wears glasses    Past Surgical History:  Procedure Laterality Date   CARPAL TUNNEL RELEASE Left 05/14/2014   Procedure: LEFT ULNAR NEUROPLASTY AT ELBOW AND ENDOSCOPIC CARPAL TUNNEL RELEASE;  Surgeon: Jodi Marble, MD;  Location: [facility];  Service: Orthopedics;  Laterality: Left;   CORONARY STENT INTERVENTION N/A 10/09/2019   Procedure: CORONARY STENT INTERVENTION;  Surgeon: Yates Decamp, MD;  Location: [facility];  Service: Cardiovascular;  Laterality: N/A;   CYST EXCISION  1995   under arms   endometrial biopsy  2004   benign   FOOT OSTEOTOMY  2010   right-spurs   LEFT HEART CATH AND CORONARY ANGIOGRAPHY N/A 10/09/2019   Procedure: LEFT HEART CATH AND CORONARY ANGIOGRAPHY;  Surgeon: Yates Decamp, MD;  Location: [facility];  Service: Cardiovascular;  Laterality: N/A;   TONSILLECTOMY     TOTAL KNEE ARTHROPLASTY Left 02/26/2023   Procedure: LEFT TOTAL KNEE ARTHROPLASTY;  Surgeon: Kathryne Hitch, MD;   Location: [facility];  Service: Orthopedics;  Laterality: Left;  Needs RNFA   ULNAR NERVE TRANSPOSITION Left 05/14/2014   Procedure: ULNAR NERVE DECOMPRESSION/TRANSPOSITION;  Surgeon: Jodi Marble, MD;  Location: [facility];  Service: Orthopedics;  Laterality: Left;   Patient Active Problem List   Diagnosis Date Noted   Arthrofibrosis of total knee arthroplasty, subsequent encounter 04/07/2023   Status post left knee replacement 02/26/2023   Unilateral primary osteoarthritis, left knee 09/09/2022   Intractable nausea and vomiting 07/04/2022   Hematemesis 07/04/2022   Hypokalemia 07/04/2022   Lactic acidosis 07/04/2022   Pain due to onychomycosis of toenails of both feet 04/28/2022   Diabetic neuropathy, type II diabetes mellitus (HCC) 04/28/2022   De Quervain's tenosynovitis, left 11/13/2021   Bilateral injections given May 21, 2020 degenerative arthritis of knee, bilateral 05/21/2020   Traumatic hematoma of female breast 11/29/2019   Dizziness 11/29/2019   Hospital discharge follow-up 10/23/2019   NSTEMI (non-ST elevated myocardial infarction) (HCC) 10/10/2019   CAD (coronary artery disease) 10/10/2019   Chest pain 10/09/2019   Elevated troponin 10/09/2019   Uncontrolled type 2 diabetes mellitus with hyperglycemia (HCC) 10/09/2019   Obesity (BMI 30.0-34.9) 10/09/2019   Cervical spondylitis with radiculitis (HCC)  08/29/2019   Acute bursitis of left shoulder 01/30/2019   Chronic midline posterior neck pain 06/28/2018   Urgency incontinence 06/28/2018   Degenerative joint disease of knee, left 04/26/2018   Carpal tunnel syndrome on both sides 03/29/2018   Anemia 01/02/2018   Class 1 obesity with serious comorbidity and body mass index (BMI) of 32.0 to 32.9 in adult 07/23/2017   Left hip pain 05/04/2017   Carpal tunnel syndrome on right 12/27/2016   Polyarthralgia 11/09/2016   Uncontrolled type 2 diabetes mellitus with retinopathy, with long-term current use of  insulin 07/07/2016   Visit for preventive health examination 06/21/2016   Increased urinary frequency 06/21/2016   Chronic meniscal tear of knee 08/19/2015   Left knee pain 06/19/2015   Candidiasis of skin 06/18/2015   Genital herpes 07/31/2014   Ulnar nerve compression 09/21/2013   Noncompliance with diet and medication regimen 11/15/2012   Boil, axilla 08/14/2012   Arthritis    Screening for breast cancer 08/04/2011   Screening for colon cancer 08/04/2011   HIDRADENITIS SUPPURATIVA 04/11/2007   GERD 11/17/2006   ENDOMETRIAL POLYP 11/17/2006   LOW BACK PAIN 11/17/2006   FIBROIDS, UTERUS 11/16/2006   Hyperlipidemia associated with type 2 diabetes mellitus (HCC) 11/16/2006   Essential hypertension 11/16/2006    PCP: Georganna Skeans, MD  REFERRING PROVIDER: Kathryne Hitch, MD  REFERRING DIAG: [phone] (ICD-10-CM) - Status post left knee replacement   THERAPY DIAG:  Acute pain of left knee  Muscle weakness (generalized)  Stiffness of left knee, not elsewhere classified  Other abnormalities of gait and mobility  Unsteadiness on feet  Localized edema  Rationale for Evaluation and Treatment: Rehabilitation  ONSET DATE: 02/26/23  SUBJECTIVE:   SUBJECTIVE STATEMENT:  Pt indicated feeling complaints of continued Lt knee at rest and with movement.     PERTINENT HISTORY: OA, DM, HLD, HTN, MI s/p stent  PAIN:  NPRS scale: 10/10 Pain location: Lt knee Pain description: throbbing Aggravating factors: constant, pressure, end range movements Relieving factors: repositioning, medications  PRECAUTIONS: Fall  WEIGHT BEARING RESTRICTIONS: No  FALLS:  Has patient fallen in last 6 months? Yes. 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[employer]; currently out on leave  PLOF: Independent and Leisure: fishing  PATIENT GOALS: improve mobility and pain  NEXT MD VISIT: Not scheduled  OBJECTIVE:   PATIENT SURVEYS:  04/05/23: FOTO 38 03/15/23: FOTO 24 (predicted 49)   COGNITION: Overall cognitive status: Within functional limits for tasks assessed     SENSATION: WFL  EDEMA:  03/15/23: Circumferential: Knee Joint Line: Rt: 34.5 cm/Lt: 39 cm   PALPATION: 03/15/23: very tender to light touch  LOWER EXTREMITY ROM:  ROM Left 03/15/23 Left 03/22/23 Left 03/24/23 Left 03/31/23 Left 04/05/23 Left 04/07/2023 After manual Left 04/09/23 [place] 04/15/2023 Left 04/16/23  Knee flexion P: 42 A: 40 P: 69*  End of session P:  78* After manual supine AA: 82* A: 63 P: 70 Supine passive heel slide  88  A: 73 P: 82 67 AROM  In supine heel slide - limited by pain A: 81 (supine heel slide)  Knee extension P: -6 A: Unable  supine P: -5*  Supine Quad set -3* A: Seated LAQ: -15   -25 AROM in seated LAQ   -4 PROM  in heel prop TKE supine    (Blank rows = not tested)  LOWER EXTREMITY MMT:    03/15/23: Not formally tested but grossly 2/5 Lt knee  MMT Left 03/29/2023 Left 04/15/2023  Knee flexion 4/5 NT  Knee extension 3+/5 in available range NT   (Blank rows = not tested)  FUNCTIONAL TESTS:  03/15/23:  increased difficulty rising from a chair; decreased gait velocity  GAIT: 04/15/2023:  Ambulation c FWW similar to initial arrival techniques.   04/02/2023:  SPC trials in clinic c SBA and cues for use and sequencing.  Antalgic gait noted but not that deviated from gait style c FWW.   03/15/23 Distance walked: 100' Assistive device utilized: Environmental consultant - 2 wheeled Level of assistance: SBA Comments: antalgic gait, decreased speed   TODAY'S TREATMENT:                                                           DATE:   04/20/2023 TherEx NuStep L5 x 10 min for stretching into flexion (intermittent 10 second  holds) Leg press Double leg in available range 50 lbs x 10 (stopped due to Lt arm symptoms reported - see vitals below) After leg press activity:  BP 129/92 , 96 bpm  After 10 mins sitting : BP 120/85,  99 bpm  Seated AROM LAQ Lt end range pauses x 15 in available range Long sitting quad set Lt 5 sec hold x 10 , x 5 Seated gravity assisted knee flexion stretch 1 min x 3   After 15 mins sitting time:  BP 108/87, pulse 99  Manual Seated Lt knee flexion overpressure to tolerance, gentle contract/relax techniques for flexion mobility   TODAY'S TREATMENT:                                                           DATE:   04/19/2023 TherEx NuStep L5 x 10 min Seated AA Lt knee flexion 5x 5 sec hold (RLE providing assist) unable to tolerate any more or longer today Seated LAQ X 10 on left Supine AA heel slides x 10 reps Supine quad sets X 10 Attempted supine hamstring stretch but unable due to pain.   Vasopneumatic Device 10 mins Lt knee in elevation medium compression 34 degrees.   TODAY'S TREATMENT:                                                           DATE:  04/16/2023 TherEx NuStep L5 x 10 min Seated AA Lt knee flexion 10 x 10 sec hold (RLE providing assist) Seated AA Lt LAQ 2x10; using strap to maximize ROM, cues for eccentric control Supine AA heel slides x 10 reps  Manual Supine Lt knee flexion to tolerance x 10 min   Vasopneumatic Device 10 mins Lt knee in elevation medium compression 34 degrees.    TODAY'S TREATMENT:  DATE:   04/15/2023 Re-eval  Therex: Nustep Lvl 5 AROM to tolerance 10 mins Seated LAQ AROM x 10 Supine heel slide x 5 with assistance  Activity limited by pain and cognitive awareness s/p recent manipulation.   Vasopneumatic Device: 10 mins Lt knee in elevation medium compression 34 degrees.      HOME EXERCISE PROGRAM: Access Code: [email or web address] URL:  [email or web address] Date: 03/29/2023 Prepared by: Chyrel Masson  Exercises - Quad Set (Mirrored)  - 5-10 x daily - 7 x weekly - 1 sets - 5-10 reps - 5 sec hold - Supine Heel Slide with Strap  - 5-10 x daily - 7 x weekly - 1 sets - 5-10 reps - Seated Heel Slide (Mirrored)  - 5-10 x daily - 7 x weekly - 1 sets - 5-10 reps - Seated Quad Set (Mirrored)  - 5-10 x daily - 7 x weekly - 1 sets - 5-10 reps - 5 sec hold - Seated Long Arc Quad (Mirrored)  - 3-5 x daily - 7 x weekly - 1-2 sets - 10 reps - 2 hold - Seated Knee Flexion Extension AAROM with Overpressure (Mirrored)  - 5-6 x daily - 7 x weekly - 1 sets - 5 reps - 10 hold - Seated Straight Leg Heel Taps  - 1-2 x daily - 7 x weekly - 3 sets - 5-10 reps  ASSESSMENT: CLINICAL IMPRESSION: Continued intermittent complaints of Lt arm symptoms (tingling, pain) that sometimes was noted with exertion.  BP checks showed some increase in diastolic number and higher pulse rate.  Symptoms have been reported and vitals assessed in past visits.  Recommendation was given to contact primary or cardiac MD due to history of cardiac event.  Pt has not contacted as of yet.  Gave instruction to perform this communication to check possible EKG etc.    Leg related, Pt continued to show high guarding and pain response to activity in clinic, regardless of type.  Pt has flexion mobility limits still noted and has shown increased difficulty in quad activation in last week or so as well, possibly inhibited by pain symptoms.   OBJECTIVE IMPAIRMENTS: Abnormal gait, decreased balance, decreased endurance, decreased mobility, difficulty walking, decreased ROM, decreased strength, hypomobility, increased edema, increased fascial restrictions, increased muscle spasms, and pain.   ACTIVITY LIMITATIONS: carrying, lifting, bending, sitting, standing, squatting, sleeping, stairs, transfers, bed mobility, toileting, dressing, and locomotion level  PARTICIPATION  LIMITATIONS: meal prep, cleaning, laundry, driving, shopping, community activity, and occupation  PERSONAL FACTORS: 3+ comorbidities: OA, DM, HLD, HTN, MI s/p stent  are also affecting patient's functional outcome.   REHAB POTENTIAL: Fair elevated pain, limited ROM and tolerance to activity  CLINICAL DECISION MAKING: Evolving/moderate complexity  EVALUATION COMPLEXITY: Moderate   GOALS: Goals reviewed with patient? Yes  SHORT TERM GOALS: Target date: 04/12/2023  Independent with initial HEP Goal status  Met  2.  Lt knee AROM 5-60 deg for improved function Goal status: mostly met  LONG TERM GOALS: Target date: 06/10/2023  Independent with final HEP Goal status: revised 04/15/2023  2.  FOTO score improved to 49 Goal status: revised 04/15/2023  3.  Lt knee AROM improved to 0-100 for improved mobility for daily activity  Goal status: revised 04/15/2023  4.  Report pain < 3/10 with standing and walking for improved function Goal status: revised 04/15/2023  5.  Demonstrate ability to amb with LRAD modified independent for improved mobility Goal status: revised 04/15/2023  6.  Patient will demonstrate/report ability to ascend/descend stairs  with single hand rail and reciprocal gait pattern.  Goal status: revised 04/15/2023  7.  Patient will demonstrate Lt knee MMT 5/5 throughout to facilitate stairs, transfers and ambulation at PLOF.    PLAN:  PT FREQUENCY: every business day for 1 week post manip, then 2-3 x per week   PT DURATION: 8 weeks  PLANNED INTERVENTIONS: Therapeutic exercises, Therapeutic activity, Neuromuscular re-education, Balance training, Gait training, Patient/Family education, Self Care, Joint mobilization, Stair training, DME instructions, Aquatic Therapy, Dry Needling, Electrical stimulation, Cryotherapy, Moist heat, Taping, Vasopneumatic device, Manual therapy, and Re-evaluation  PLAN FOR NEXT SESSION: Quad activation, mobility gains.  Monitor vital and Lt  arm symptoms.  Check to see if she has called MD about those symptoms.    Chyrel Masson, PT, DPT, OCS, ATC 04/20/23  10:51 AM

## 2023-04-20 NOTE — Telephone Encounter (Signed)
  Chief Complaint: left arm pain / discomfort Symptoms: left arm pain feels like "toothache" . Radiates to neck and jaw. Started after knee surgery, can pick up objects Frequency: 2 weeks  Pertinent Negatives: Patient denies chest pain no difficulty breathing no fever no rash no swelling of arm. No N/T  Disposition: [] ED /[] Urgent Care (no appt availability in office) / [] Appointment(In office/virtual)/ []  [place] [facility]/ [] Home Care/ [] Refused Recommended Disposition /[x] [facility]/ []  Follow-up with PCP Additional Notes:   No available appt until 04/30/23. Recommended mobile bus today . Recommended if sx worsen go to ED.    Reason for Disposition  [1] Arm pains with exertion (e.g., walking) AND [2] pain goes away on resting AND [3] not present now  Answer Assessment - Initial Assessment Questions 1. ONSET: "When did the pain start?"     2 week ago after knee surgery 2. LOCATION: "Where is the pain located?"     Left arm 3. PAIN: "How bad is the pain?" (Scale 1-10; or mild, moderate, severe)   - MILD (1-3): Doesn't interfere with normal activities.   - MODERATE (4-7): Interferes with normal activities (e.g., work or school) or awakens from sleep.   - SEVERE (8-10): Excruciating pain, unable to do any normal activities, unable to hold a cup of water.     Moderate interferes with sleep  4. WORK OR EXERCISE: "Has there been any recent work or exercise that involved this part of the body?"     Denies  5. CAUSE: "What do you think is causing the arm pain?"     Not sure  6. OTHER SYMPTOMS: "Do you have any other symptoms?" (e.g., neck pain, swelling, rash, fever, numbness, weakness)     Left arm discomfort "like a toothache" pain radiates to neck and jaw at times.  7. PREGNANCY: "Is there any chance you are pregnant?" "When was your last menstrual period?"     na  Protocols used: Arm Pain-A-AH

## 2023-04-20 NOTE — Patient Instructions (Signed)
I sent Zofran to your pharmacy, I do encourage you to use this to help with your nausea so you can get some good nutrition.  Make sure that you are staying well-hydrated.  Your EKG did not show any abnormality, I do encourage you to be mindful of your symptoms and consider presenting to the emergency department if you develop worsening symptoms, chest pain or shortness of breath.  Roney Jaffe, PA-C Physician Assistant [facility] [email or web address]   Heart Attack - Warning Signs and What to Do A heart attack is a condition that occurs when your heart does not get enough oxygen. When this happens, the heart muscle begins to die. This can cause lasting damage if not treated right away. A heart attack is a medical emergency. Be aware of the warning signs of a heart attack and act quickly. Risk Factors Your health care provider will talk with you about risk factors. These may include: Aging. Having a personal or family history of chest pain, heart attack, stroke, or narrowing of the arteries due to plaque buildup. Having taken chemotherapy or immune-suppressing medicines. Being female. Having a pregnancy history that includes pre-eclampsia. Being overweight or obese. Having any of these conditions: High blood pressure. High cholesterol. Diabetes. Certain lifestyle choices may put you at a higher risk of a heart attack. These include: Drinking too much alcohol. Not getting regular exercise. Smoking or using tobacco products. Using street drugs. Warning signs or symptoms of a heart attack Heart damage can occur within the first few hours of a heart attack. This is why it is important to recognize early symptoms and get help right away. Warning signs and symptoms may include: Chest pain that may feel like: Crushing or squeezing. Tightness, pressure, fullness, or heaviness. Pain in the arm, neck, jaw, back, or upper body. Heartburn  or upset stomach. Shortness of breath. Vomiting or nausea. Sudden sweating or clammy skin. Sudden light-headedness, dizziness, or passing out. Feeling tired (fatigue). Sudden feeling of anxiety. Fast or irregular heartbeat (palpitations). Heart attacks can be sudden and intense or can start slowly, with mild pain or discomfort. In some cases, heart attacks may happen with very mild symptoms or no symptoms at all (silent heart attacks). Silent heart attacks are more common in older adults or people with diabetes. Heart attack symptoms that can be mistaken for a less serious health issue Certain symptoms can be quickly associated with a heart attack, such as chest pain or pressure. However, there are other symptoms that may seem less serious or not related to a heart attack, but they actually are. These symptoms are called atypical symptoms. Be alert for the following atypical symptoms: Sharp pain in the side or chest that occurs with coughing or breathing. Shortness of breath or trouble breathing. Pain that spreads above the jaw or down into the body. Can symptoms differ between men and women? Heart attack symptoms can be different among men and women. Men may feel pain and numbness in the left side of their chest, while women feel it in the right side. Women may have signs and symptoms that may not be quickly associated with a heart attack. They may be more subtle and sometimes confusing. These include: Nausea. Dizziness. Shortness of breath. Fatigue. Upper back pain that travels up the back, neck, and into the jaw. Where to find more information American Heart Association: heart.org Centers for Disease Control and Prevention: TonerPromos.no Get help right away if: You have any combination of these symptoms: Severe  chest discomfort, especially if the pain is crushing or pressure-like and spreads to your arms, back, neck, or jaw. Discomfort in the chest, neck, arm, jaw, or back (angina) that  lasts for longer than 5 minutes and medicine does not help. Palpitations. Trouble breathing or shortness of breath. Sudden sweating or clammy skin. Nausea or vomiting. Unexplained tiredness or weakness. These symptoms may be an emergency. Get help right away. Call 911. Do not wait to see if the symptoms will go away. Do not drive yourself to the hospital. This information is not intended to replace advice given to you by your health care provider. Make sure you discuss any questions you have with your health care provider. Document Revised: 05/14/2022 Document Reviewed: 05/14/2022 Elsevier Patient Education  2023 [employer].

## 2023-04-21 ENCOUNTER — Encounter: Payer: Self-pay | Admitting: Physical Therapy

## 2023-04-21 ENCOUNTER — Ambulatory Visit (INDEPENDENT_AMBULATORY_CARE_PROVIDER_SITE_OTHER): Payer: BC Managed Care – PPO | Admitting: Physical Therapy

## 2023-04-21 DIAGNOSIS — M25562 Pain in left knee: Secondary | ICD-10-CM | POA: Diagnosis not present

## 2023-04-21 DIAGNOSIS — R2689 Other abnormalities of gait and mobility: Secondary | ICD-10-CM | POA: Diagnosis not present

## 2023-04-21 DIAGNOSIS — M25662 Stiffness of left knee, not elsewhere classified: Secondary | ICD-10-CM

## 2023-04-21 DIAGNOSIS — R600 Localized edema: Secondary | ICD-10-CM

## 2023-04-21 DIAGNOSIS — R2681 Unsteadiness on feet: Secondary | ICD-10-CM

## 2023-04-21 DIAGNOSIS — M6281 Muscle weakness (generalized): Secondary | ICD-10-CM

## 2023-04-21 LAB — CBC WITH DIFFERENTIAL/PLATELET
Basophils Absolute: 0.1 10*3/uL (ref 0.0–0.2)
Basos: 1 %
EOS (ABSOLUTE): 0.2 10*3/uL (ref 0.0–0.4)
Eos: 2 %
Hematocrit: 36.1 % (ref 34.0–46.6)
Hemoglobin: 11.5 g/dL (ref 11.1–15.9)
Immature Grans (Abs): 0 10*3/uL (ref 0.0–0.1)
Immature Granulocytes: 0 %
Lymphocytes Absolute: 3.3 10*3/uL — ABNORMAL HIGH (ref 0.7–3.1)
Lymphs: 29 %
MCH: 28.1 pg (ref 26.6–33.0)
MCHC: 31.9 g/dL (ref 31.5–35.7)
MCV: 88 fL (ref 79–97)
Monocytes Absolute: 0.5 10*3/uL (ref 0.1–0.9)
Monocytes: 5 %
Neutrophils Absolute: 7.2 10*3/uL — ABNORMAL HIGH (ref 1.4–7.0)
Neutrophils: 63 %
Platelets: 471 10*3/uL — ABNORMAL HIGH (ref 150–450)
RBC: 4.09 x10E6/uL (ref 3.77–5.28)
RDW: 13.2 % (ref 11.7–15.4)
WBC: 11.3 10*3/uL — ABNORMAL HIGH (ref 3.4–10.8)

## 2023-04-21 LAB — BASIC METABOLIC PANEL
BUN/Creatinine Ratio: 27 (ref 12–28)
BUN: 13 mg/dL (ref 8–27)
CO2: 23 mmol/L (ref 20–29)
Calcium: 10.2 mg/dL (ref 8.7–10.3)
Chloride: 103 mmol/L (ref 96–106)
Creatinine, Ser: 0.48 mg/dL — ABNORMAL LOW (ref 0.57–1.00)
Glucose: 117 mg/dL — ABNORMAL HIGH (ref 70–99)
Potassium: 3.9 mmol/L (ref 3.5–5.2)
Sodium: 143 mmol/L (ref 134–144)
eGFR: 106 mL/min/{1.73_m2} (ref >59)

## 2023-04-21 NOTE — Therapy (Signed)
OUTPATIENT PHYSICAL THERAPY TREATMENT   Patient Name: [name] MRN: [id number] DOB:March 03, 1961, 62 y.o., female Today's Date: 04/21/2023    END OF SESSION:  PT End of Session - 04/21/23 0903     Visit Number 17    Number of Visits 28    Date for PT Re-Evaluation 06/10/23    Authorization Type [employer] State $72 copay    Progress Note Due on Visit 23    PT Start Time 0858    PT Stop Time 0955    PT Time Calculation (min) 57 min    Activity Tolerance Patient limited by pain    Behavior During Therapy [facility] for tasks assessed/performed                      Past Medical History:  Diagnosis Date   Arthritis    Campylobacter enteritis May 2012   [facility] admission   Carpal tunnel syndrome, bilateral    Diabetes mellitus    Gastritis with bleeding due to alcohol 2010   [facility] admission   GERD (gastroesophageal reflux disease)    Hyperlipidemia    Hypertension    no meds   Menopause    Myocardial infarction (HCC)    Neck pain    Wears glasses    Past Surgical History:  Procedure Laterality Date   CARPAL TUNNEL RELEASE Left 05/14/2014   Procedure: LEFT ULNAR NEUROPLASTY AT ELBOW AND ENDOSCOPIC CARPAL TUNNEL RELEASE;  Surgeon: Jodi Marble, MD;  Location: [facility];  Service: Orthopedics;  Laterality: Left;   CORONARY STENT INTERVENTION N/A 10/09/2019   Procedure: CORONARY STENT INTERVENTION;  Surgeon: Yates Decamp, MD;  Location: [facility];  Service: Cardiovascular;  Laterality: N/A;   CYST EXCISION  1995   under arms   endometrial biopsy  2004   benign   FOOT OSTEOTOMY  2010   right-spurs   LEFT HEART CATH AND CORONARY ANGIOGRAPHY N/A 10/09/2019   Procedure: LEFT HEART CATH AND CORONARY ANGIOGRAPHY;  Surgeon: Yates Decamp, MD;  Location: [facility];  Service: Cardiovascular;  Laterality: N/A;   TONSILLECTOMY     TOTAL KNEE ARTHROPLASTY Left 02/26/2023   Procedure: LEFT TOTAL KNEE ARTHROPLASTY;  Surgeon: Kathryne Hitch, MD;   Location: [facility];  Service: Orthopedics;  Laterality: Left;  Needs RNFA   ULNAR NERVE TRANSPOSITION Left 05/14/2014   Procedure: ULNAR NERVE DECOMPRESSION/TRANSPOSITION;  Surgeon: Jodi Marble, MD;  Location: [facility];  Service: Orthopedics;  Laterality: Left;   Patient Active Problem List   Diagnosis Date Noted   Arthrofibrosis of total knee arthroplasty, subsequent encounter 04/07/2023   Status post left knee replacement 02/26/2023   Unilateral primary osteoarthritis, left knee 09/09/2022   Intractable nausea and vomiting 07/04/2022   Hematemesis 07/04/2022   Hypokalemia 07/04/2022   Lactic acidosis 07/04/2022   Pain due to onychomycosis of toenails of both feet 04/28/2022   Diabetic neuropathy, type II diabetes mellitus (HCC) 04/28/2022   De Quervain's tenosynovitis, left 11/13/2021   Bilateral injections given May 21, 2020 degenerative arthritis of knee, bilateral 05/21/2020   Traumatic hematoma of female breast 11/29/2019   Dizziness 11/29/2019   Hospital discharge follow-up 10/23/2019   NSTEMI (non-ST elevated myocardial infarction) (HCC) 10/10/2019   CAD (coronary artery disease) 10/10/2019   Chest pain 10/09/2019   Elevated troponin 10/09/2019   Uncontrolled type 2 diabetes mellitus with hyperglycemia (HCC) 10/09/2019   Obesity (BMI 30.0-34.9) 10/09/2019   Cervical spondylitis with radiculitis (HCC)  08/29/2019   Acute bursitis of left shoulder 01/30/2019   Chronic midline posterior neck pain 06/28/2018   Urgency incontinence 06/28/2018   Degenerative joint disease of knee, left 04/26/2018   Carpal tunnel syndrome on both sides 03/29/2018   Anemia 01/02/2018   Class 1 obesity with serious comorbidity and body mass index (BMI) of 32.0 to 32.9 in adult 07/23/2017   Left hip pain 05/04/2017   Carpal tunnel syndrome on right 12/27/2016   Polyarthralgia 11/09/2016   Uncontrolled type 2 diabetes mellitus with retinopathy, with long-term current use of  insulin 07/07/2016   Visit for preventive health examination 06/21/2016   Increased urinary frequency 06/21/2016   Chronic meniscal tear of knee 08/19/2015   Left knee pain 06/19/2015   Candidiasis of skin 06/18/2015   Genital herpes 07/31/2014   Ulnar nerve compression 09/21/2013   Noncompliance with diet and medication regimen 11/15/2012   Boil, axilla 08/14/2012   Arthritis    Screening for breast cancer 08/04/2011   Screening for colon cancer 08/04/2011   HIDRADENITIS SUPPURATIVA 04/11/2007   GERD 11/17/2006   ENDOMETRIAL POLYP 11/17/2006   LOW BACK PAIN 11/17/2006   FIBROIDS, UTERUS 11/16/2006   Hyperlipidemia associated with type 2 diabetes mellitus (HCC) 11/16/2006   Essential hypertension 11/16/2006    PCP: Georganna Skeans, MD  REFERRING PROVIDER: Kathryne Hitch, MD  REFERRING DIAG: [phone] (ICD-10-CM) - Status post left knee replacement   THERAPY DIAG:  Acute pain of left knee  Stiffness of left knee, not elsewhere classified  Muscle weakness (generalized)  Other abnormalities of gait and mobility  Unsteadiness on feet  Localized edema  Rationale for Evaluation and Treatment: Rehabilitation  ONSET DATE: 02/26/23  SUBJECTIVE:   SUBJECTIVE STATEMENT:  She went to mobile medical unit and ruled out heart issue.    PERTINENT HISTORY: OA, DM, HLD, HTN, MI s/p stent  PAIN:  NPRS scale: 8/10 Pain location: Lt knee Pain description: throbbing Aggravating factors: constant, pressure, end range movements Relieving factors: repositioning, medications  PRECAUTIONS: Fall  WEIGHT BEARING RESTRICTIONS: No  FALLS:  Has patient fallen in last 6 months? Yes. 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[employer]; currently out on leave  PLOF: Independent and Leisure: fishing  PATIENT GOALS: improve mobility and pain  NEXT MD VISIT: Not scheduled  OBJECTIVE:   PATIENT SURVEYS:  04/05/23: FOTO 38 03/15/23: FOTO 24 (predicted 49)   COGNITION: Overall cognitive status: Within functional limits for tasks assessed     SENSATION: WFL  EDEMA:  03/15/23: Circumferential: Knee Joint Line: Rt: 34.5 cm/Lt: 39 cm   PALPATION: 03/15/23: very tender to light touch  LOWER EXTREMITY ROM:  ROM Left 03/15/23 Left 03/22/23 Left 03/24/23 Left 03/31/23 Left 04/05/23 Left 04/07/2023 After manual Left 04/09/23 [place] 04/15/2023 Left 04/16/23  Knee flexion P: 42 A: 40 P: 69*  End of session P:  78* After manual supine AA: 82* A: 63 P: 70 Supine passive heel slide  88  A: 73 P: 82 67 AROM  In supine heel slide - limited by pain A: 81 (supine heel slide)  Knee extension P: -6 A: Unable  supine P: -5*  Supine Quad set -3* A: Seated LAQ: -15   -25 AROM in seated LAQ   -4 PROM in heel prop  TKE supine    (Blank rows = not tested)  LOWER EXTREMITY MMT:    03/15/23: Not formally tested but grossly 2/5 Lt knee  MMT Left 03/29/2023 Left 04/15/2023  Knee flexion 4/5 NT  Knee extension 3+/5 in available range NT   (Blank rows = not tested)  FUNCTIONAL TESTS:  03/15/23:  increased difficulty rising from a chair; decreased gait velocity  GAIT: 04/15/2023:  Ambulation c FWW similar to initial arrival techniques.   04/02/2023:  SPC trials in clinic c SBA and cues for use and sequencing.  Antalgic gait noted but not that deviated from gait style c FWW.   03/15/23 Distance walked: 100' Assistive device utilized: Environmental consultant - 2 wheeled Level of assistance: SBA Comments: antalgic gait, decreased speed   TODAY'S TREATMENT:                                                           DATE:   04/21/2023 TherEx NuStep seat 8 L5 x 10 min for stretching into flexion (intermittent 10 second holds) Leg press Double  leg in available range 50 lbs x 10 reps 2 sets Seated AROM LAQ Lt end range AAROM with isometric hold, controlled eccentric then active knee flexion with contralateral LE opposing motion x 15 reps  in available range Seated gravity assisted knee flexion stretch 1 min x 3   Self-Care PT demo & verbal cues on ice massage. PT recommended doing ice massage 5 min prior & after exercises. Pt return demo & verbalized understanding.   Manual Seated Lt knee flexion overpressure to tolerance, gentle contract/relax techniques for flexion mobility  Hamstring stretch passive during rest for leg press  Vasopneumatic Device 10 mins Lt knee in elevation medium compression 34 degrees.   TREATMENT:                                                           DATE:   04/20/2023 TherEx NuStep L5 x 10 min for stretching into flexion (intermittent 10 second holds) Leg press Double leg in available range 50 lbs x 10 (stopped due to Lt arm symptoms reported - see vitals below) After leg press activity:  BP 129/92 , 96 bpm  After 10 mins sitting : BP 120/85,  99 bpm  Seated AROM LAQ Lt end range pauses x 15 in available range Long sitting quad set Lt 5 sec hold x 10 , x 5 Seated gravity assisted knee flexion stretch 1 min x 3   After 15 mins sitting time:  BP 108/87, pulse 99  Manual Seated Lt knee flexion overpressure to tolerance, gentle contract/relax techniques for flexion mobility   TREATMENT:                                                           DATE:   04/19/2023 TherEx NuStep L5 x 10 min Seated AA Lt knee flexion 5x 5 sec hold (RLE providing assist) unable  to tolerate any more or longer today Seated LAQ X 10 on left Supine AA heel slides x 10 reps Supine quad sets X 10 Attempted supine hamstring stretch but unable due to pain.   Vasopneumatic Device 10 mins Lt knee in elevation medium compression 34 degrees.     HOME EXERCISE PROGRAM: Access Code: [email or web address] URL:  [email or web address] Date: 03/29/2023 Prepared by: Chyrel Masson  Exercises - Quad Set (Mirrored)  - 5-10 x daily - 7 x weekly - 1 sets - 5-10 reps - 5 sec hold - Supine Heel Slide with Strap  - 5-10 x daily - 7 x weekly - 1 sets - 5-10 reps - Seated Heel Slide (Mirrored)  - 5-10 x daily - 7 x weekly - 1 sets - 5-10 reps - Seated Quad Set (Mirrored)  - 5-10 x daily - 7 x weekly - 1 sets - 5-10 reps - 5 sec hold - Seated Long Arc Quad (Mirrored)  - 3-5 x daily - 7 x weekly - 1-2 sets - 10 reps - 2 hold - Seated Knee Flexion Extension AAROM with Overpressure (Mirrored)  - 5-6 x daily - 7 x weekly - 1 sets - 5 reps - 10 hold - Seated Straight Leg Heel Taps  - 1-2 x daily - 7 x weekly - 3 sets - 5-10 reps  ASSESSMENT: CLINICAL IMPRESSION: Patient appeared to benefit from ice massage and understands use at home.  Pain continues to limit range.  Pt improved motion during session.   Leg related, Pt continued to show high guarding and pain response to activity in clinic, regardless of type.  Pt has flexion mobility limits still noted and has shown increased difficulty in quad activation in last week or so as well, possibly inhibited by pain symptoms.   OBJECTIVE IMPAIRMENTS: Abnormal gait, decreased balance, decreased endurance, decreased mobility, difficulty walking, decreased ROM, decreased strength, hypomobility, increased edema, increased fascial restrictions, increased muscle spasms, and pain.   ACTIVITY LIMITATIONS: carrying, lifting, bending, sitting, standing, squatting, sleeping, stairs, transfers, bed mobility, toileting, dressing, and locomotion level  PARTICIPATION LIMITATIONS: meal prep, cleaning, laundry, driving, shopping, community activity, and occupation  PERSONAL FACTORS: 3+ comorbidities: OA, DM, HLD, HTN, MI s/p stent  are also affecting patient's functional outcome.   REHAB POTENTIAL: Fair elevated pain, limited ROM and tolerance to activity  CLINICAL  DECISION MAKING: Evolving/moderate complexity  EVALUATION COMPLEXITY: Moderate   GOALS: Goals reviewed with patient? Yes  SHORT TERM GOALS: Target date: 04/12/2023  Independent with initial HEP Goal status  Met  2.  Lt knee AROM 5-60 deg for improved function Goal status: mostly met  LONG TERM GOALS: Target date: 06/10/2023  Independent with final HEP Goal status: revised 04/15/2023  2.  FOTO score improved to 49 Goal status: revised 04/15/2023  3.  Lt knee AROM improved to 0-100 for improved mobility for daily activity  Goal status: revised 04/15/2023  4.  Report pain < 3/10 with standing and walking for improved function Goal status: revised 04/15/2023  5.  Demonstrate ability to amb with LRAD modified independent for improved mobility Goal status: revised 04/15/2023  6.  Patient will demonstrate/report ability to ascend/descend stairs with single hand rail and reciprocal gait pattern.  Goal status: revised 04/15/2023  7.  Patient will demonstrate Lt knee MMT 5/5 throughout to facilitate stairs, transfers and ambulation at PLOF.    PLAN:  PT FREQUENCY: every business day for 1 week post manip, then 2-3 x per week   PT DURATION: 8  weeks  PLANNED INTERVENTIONS: Therapeutic exercises, Therapeutic activity, Neuromuscular re-education, Balance training, Gait training, Patient/Family education, Self Care, Joint mobilization, Stair training, DME instructions, Aquatic Therapy, Dry Needling, Electrical stimulation, Cryotherapy, Moist heat, Taping, Vasopneumatic device, Manual therapy, and Re-evaluation  PLAN FOR NEXT SESSION: continue Quad activation, mobility gains. Manual therapy & therapeutic exercise.   Vladimir Faster, PT, DPT 04/21/2023, 10:34 AM

## 2023-04-22 ENCOUNTER — Ambulatory Visit (INDEPENDENT_AMBULATORY_CARE_PROVIDER_SITE_OTHER): Payer: BC Managed Care – PPO | Admitting: Physical Therapy

## 2023-04-22 ENCOUNTER — Encounter: Payer: Self-pay | Admitting: Physical Therapy

## 2023-04-22 DIAGNOSIS — R2689 Other abnormalities of gait and mobility: Secondary | ICD-10-CM | POA: Diagnosis not present

## 2023-04-22 DIAGNOSIS — R600 Localized edema: Secondary | ICD-10-CM

## 2023-04-22 DIAGNOSIS — M25562 Pain in left knee: Secondary | ICD-10-CM

## 2023-04-22 DIAGNOSIS — R2681 Unsteadiness on feet: Secondary | ICD-10-CM

## 2023-04-22 DIAGNOSIS — M6281 Muscle weakness (generalized): Secondary | ICD-10-CM

## 2023-04-22 DIAGNOSIS — M25662 Stiffness of left knee, not elsewhere classified: Secondary | ICD-10-CM

## 2023-04-22 NOTE — Therapy (Signed)
OUTPATIENT PHYSICAL THERAPY TREATMENT   Patient Name: [name] MRN: [id number] DOB:Oct 28, 1961, 62 y.o., female Today's Date: 04/22/2023    END OF SESSION:  PT End of Session - 04/22/23 0802     Visit Number 18    Number of Visits 28    Date for PT Re-Evaluation 06/10/23    Authorization Type [employer] State $72 copay    Progress Note Due on Visit 23    PT Start Time 0800    PT Stop Time 0841    PT Time Calculation (min) 41 min    Activity Tolerance Patient limited by pain    Behavior During Therapy [facility] for tasks assessed/performed                       Past Medical History:  Diagnosis Date   Arthritis    Campylobacter enteritis May 2012   [facility] admission   Carpal tunnel syndrome, bilateral    Diabetes mellitus    Gastritis with bleeding due to alcohol 2010   [facility] admission   GERD (gastroesophageal reflux disease)    Hyperlipidemia    Hypertension    no meds   Menopause    Myocardial infarction (HCC)    Neck pain    Wears glasses    Past Surgical History:  Procedure Laterality Date   CARPAL TUNNEL RELEASE Left 05/14/2014   Procedure: LEFT ULNAR NEUROPLASTY AT ELBOW AND ENDOSCOPIC CARPAL TUNNEL RELEASE;  Surgeon: Jodi Marble, MD;  Location: [facility];  Service: Orthopedics;  Laterality: Left;   CORONARY STENT INTERVENTION N/A 10/09/2019   Procedure: CORONARY STENT INTERVENTION;  Surgeon: Yates Decamp, MD;  Location: [facility];  Service: Cardiovascular;  Laterality: N/A;   CYST EXCISION  1995   under arms   endometrial biopsy  2004   benign   FOOT OSTEOTOMY  2010   right-spurs   LEFT HEART CATH AND CORONARY ANGIOGRAPHY N/A 10/09/2019   Procedure: LEFT HEART CATH AND CORONARY ANGIOGRAPHY;  Surgeon: Yates Decamp, MD;  Location: [facility];  Service: Cardiovascular;  Laterality: N/A;   TONSILLECTOMY     TOTAL KNEE ARTHROPLASTY Left 02/26/2023   Procedure: LEFT TOTAL KNEE ARTHROPLASTY;  Surgeon: Kathryne Hitch,  MD;  Location: [facility];  Service: Orthopedics;  Laterality: Left;  Needs RNFA   ULNAR NERVE TRANSPOSITION Left 05/14/2014   Procedure: ULNAR NERVE DECOMPRESSION/TRANSPOSITION;  Surgeon: Jodi Marble, MD;  Location: [facility];  Service: Orthopedics;  Laterality: Left;   Patient Active Problem List   Diagnosis Date Noted   Arthrofibrosis of total knee arthroplasty, subsequent encounter 04/07/2023   Status post left knee replacement 02/26/2023   Unilateral primary osteoarthritis, left knee 09/09/2022   Intractable nausea and vomiting 07/04/2022   Hematemesis 07/04/2022   Hypokalemia 07/04/2022   Lactic acidosis 07/04/2022   Pain due to onychomycosis of toenails of both feet 04/28/2022   Diabetic neuropathy, type II diabetes mellitus (HCC) 04/28/2022   De Quervain's tenosynovitis, left 11/13/2021   Bilateral injections given May 21, 2020 degenerative arthritis of knee, bilateral 05/21/2020   Traumatic hematoma of female breast 11/29/2019   Dizziness 11/29/2019   Hospital discharge follow-up 10/23/2019   NSTEMI (non-ST elevated myocardial infarction) (HCC) 10/10/2019   CAD (coronary artery disease) 10/10/2019   Chest pain 10/09/2019   Elevated troponin 10/09/2019   Uncontrolled type 2 diabetes mellitus with hyperglycemia (HCC) 10/09/2019   Obesity (BMI 30.0-34.9) 10/09/2019   Cervical spondylitis with radiculitis (  HCC) 08/29/2019   Acute bursitis of left shoulder 01/30/2019   Chronic midline posterior neck pain 06/28/2018   Urgency incontinence 06/28/2018   Degenerative joint disease of knee, left 04/26/2018   Carpal tunnel syndrome on both sides 03/29/2018   Anemia 01/02/2018   Class 1 obesity with serious comorbidity and body mass index (BMI) of 32.0 to 32.9 in adult 07/23/2017   Left hip pain 05/04/2017   Carpal tunnel syndrome on right 12/27/2016   Polyarthralgia 11/09/2016   Uncontrolled type 2 diabetes mellitus with retinopathy, with long-term current use of  insulin 07/07/2016   Visit for preventive health examination 06/21/2016   Increased urinary frequency 06/21/2016   Chronic meniscal tear of knee 08/19/2015   Left knee pain 06/19/2015   Candidiasis of skin 06/18/2015   Genital herpes 07/31/2014   Ulnar nerve compression 09/21/2013   Noncompliance with diet and medication regimen 11/15/2012   Boil, axilla 08/14/2012   Arthritis    Screening for breast cancer 08/04/2011   Screening for colon cancer 08/04/2011   HIDRADENITIS SUPPURATIVA 04/11/2007   GERD 11/17/2006   ENDOMETRIAL POLYP 11/17/2006   LOW BACK PAIN 11/17/2006   FIBROIDS, UTERUS 11/16/2006   Hyperlipidemia associated with type 2 diabetes mellitus (HCC) 11/16/2006   Essential hypertension 11/16/2006    PCP: Georganna Skeans, MD  REFERRING PROVIDER: Kathryne Hitch, MD  REFERRING DIAG: [phone] (ICD-10-CM) - Status post left knee replacement   THERAPY DIAG:  Acute pain of left knee  Stiffness of left knee, not elsewhere classified  Muscle weakness (generalized)  Other abnormalities of gait and mobility  Unsteadiness on feet  Localized edema  Rationale for Evaluation and Treatment: Rehabilitation  ONSET DATE: 02/26/23  SUBJECTIVE:   SUBJECTIVE STATEMENT:  Doing okay this morning, working on trying to bend her knee more.    PERTINENT HISTORY: OA, DM, HLD, HTN, MI s/p stent  PAIN:  NPRS scale: 8/10 Pain location: Lt knee Pain description: throbbing Aggravating factors: constant, pressure, end range movements Relieving factors: repositioning, medications  PRECAUTIONS: Fall  WEIGHT BEARING RESTRICTIONS: No  FALLS:  Has patient fallen in last 6 months? Yes. 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[employer]; currently out on leave  PLOF: Independent and Leisure: fishing  PATIENT GOALS: improve mobility and pain  NEXT MD VISIT: Not scheduled  OBJECTIVE:   PATIENT SURVEYS:  04/05/23: FOTO 38 03/15/23: FOTO 24 (predicted 49)   COGNITION: Overall cognitive status: Within functional limits for tasks assessed     SENSATION: WFL  EDEMA:  03/15/23: Circumferential: Knee Joint Line: Rt: 34.5 cm/Lt: 39 cm   PALPATION: 03/15/23: very tender to light touch  LOWER EXTREMITY ROM:  ROM Left 03/15/23 Left 03/22/23 Left 03/24/23 Left 03/31/23 Left 04/05/23 Left 04/07/2023 After manual Left 04/09/23 [place] 04/15/2023 Left 04/16/23  Knee flexion P: 42 A: 40 P: 69*  End of session P:  78* After manual supine AA: 82* A: 63 P: 70 Supine passive heel slide  88  A: 73 P: 82 67 AROM  In supine heel slide - limited by pain A: 81 (supine heel slide)  Knee extension P: -6 A: Unable  supine P: -5*  Supine Quad set -3* A: Seated LAQ: -15   -25 AROM in seated LAQ   -4 PROM in  heel prop TKE supine    (Blank rows = not tested)  LOWER EXTREMITY MMT:    03/15/23: Not formally tested but grossly 2/5 Lt knee  MMT Left 03/29/2023 Left 04/15/2023  Knee flexion 4/5 NT  Knee extension 3+/5 in available range NT   (Blank rows = not tested)  FUNCTIONAL TESTS:  03/15/23:  increased difficulty rising from a chair; decreased gait velocity  GAIT: 04/15/2023:  Ambulation c FWW similar to initial arrival techniques.   04/02/2023:  SPC trials in clinic c SBA and cues for use and sequencing.  Antalgic gait noted but not that deviated from gait style c FWW.   03/15/23 Distance walked: 100' Assistive device utilized: Environmental consultant - 2 wheeled Level of assistance: SBA Comments: antalgic gait, decreased speed   TODAY'S TREATMENT: 04/22/23 TherEx NuStep L6 x 10 min; seat 7 for stretch into flexion Leg press bil 50#; 3x10; 60 sec hold in flexion between sets Seated LAQ within available range 2x10; 15  sec; use of percussive device PRN   04/21/2023 TherEx NuStep seat 8 L5 x 10 min for stretching into flexion (intermittent 10 second holds) Leg press Double leg in available range 50 lbs x 10 reps 2 sets Seated AROM LAQ Lt end range AAROM with isometric hold, controlled eccentric then active knee flexion with contralateral LE opposing motion x 15 reps  in available range Seated gravity assisted knee flexion stretch 1 min x 3   Self-Care PT demo & verbal cues on ice massage. PT recommended doing ice massage 5 min prior & after exercises. Pt return demo & verbalized understanding.   Manual Seated Lt knee flexion overpressure to tolerance, gentle contract/relax techniques for flexion mobility  Hamstring stretch passive during rest for leg press  Vasopneumatic Device 10 mins Lt knee in elevation medium compression 34 degrees.   TREATMENT:                                                           DATE:   04/20/2023 TherEx NuStep L5 x 10 min for stretching into flexion (intermittent 10 second holds) Leg press Double leg in available range 50 lbs x 10 (stopped due to Lt arm symptoms reported - see vitals below) After leg press activity:  BP 129/92 , 96 bpm  After 10 mins sitting : BP 120/85,  99 bpm  Seated AROM LAQ Lt end range pauses x 15 in available range Long sitting quad set Lt 5 sec hold x 10 , x 5 Seated gravity assisted knee flexion stretch 1 min x 3   After 15 mins sitting time:  BP 108/87, pulse 99  Manual Seated Lt knee flexion overpressure to tolerance, gentle contract/relax techniques for flexion mobility   TREATMENT:                                                           DATE:   04/19/2023 TherEx NuStep L5 x 10 min Seated AA Lt knee flexion 5x 5 sec hold (RLE providing assist) unable to tolerate any more or longer today Seated LAQ X 10 on left Supine AA heel slides x 10  reps Supine quad sets X 10 Attempted supine hamstring stretch but unable due to  pain.   Vasopneumatic Device 10 mins Lt knee in elevation medium compression 34 degrees.     HOME EXERCISE PROGRAM: Access Code: [email or web address] URL: [email or web address] Date: 03/29/2023 Prepared by: Chyrel Masson  Exercises - Quad Set (Mirrored)  - 5-10 x daily - 7 x weekly - 1 sets - 5-10 reps - 5 sec hold - Supine Heel Slide with Strap  - 5-10 x daily - 7 x weekly - 1 sets - 5-10 reps - Seated Heel Slide (Mirrored)  - 5-10 x daily - 7 x weekly - 1 sets - 5-10 reps - Seated Quad Set (Mirrored)  - 5-10 x daily - 7 x weekly - 1 sets - 5-10 reps - 5 sec hold - Seated Long Arc Quad (Mirrored)  - 3-5 x daily - 7 x weekly - 1-2 sets - 10 reps - 2 hold - Seated Knee Flexion Extension AAROM with Overpressure (Mirrored)  - 5-6 x daily - 7 x weekly - 1 sets - 5 reps - 10 hold - Seated Straight Leg Heel Taps  - 1-2 x daily - 7 x weekly - 3 sets - 5-10 reps  ASSESSMENT: CLINICAL IMPRESSION: Continued focus on quad activation and knee flexion today.  Will continue to benefit from PT to maximize function.  Leg related, Pt continued to show high guarding and pain response to activity in clinic, regardless of type.  Pt has flexion mobility limits still noted and has shown increased difficulty in quad activation in last week or so as well, possibly inhibited by pain symptoms.   OBJECTIVE IMPAIRMENTS: Abnormal gait, decreased balance, decreased endurance, decreased mobility, difficulty walking, decreased ROM, decreased strength, hypomobility, increased edema, increased fascial restrictions, increased muscle spasms, and pain.   ACTIVITY LIMITATIONS: carrying, lifting, bending, sitting, standing, squatting, sleeping, stairs, transfers, bed mobility, toileting, dressing, and locomotion level  PARTICIPATION LIMITATIONS: meal prep, cleaning, laundry, driving, shopping, community activity, and occupation  PERSONAL FACTORS: 3+ comorbidities: OA, DM, HLD, HTN, MI s/p stent  are also affecting  patient's functional outcome.   REHAB POTENTIAL: Fair elevated pain, limited ROM and tolerance to activity  CLINICAL DECISION MAKING: Evolving/moderate complexity  EVALUATION COMPLEXITY: Moderate   GOALS: Goals reviewed with patient? Yes  SHORT TERM GOALS: Target date: 04/12/2023  Independent with initial HEP Goal status  Met  2.  Lt knee AROM 5-60 deg for improved function Goal status: mostly met  LONG TERM GOALS: Target date: 06/10/2023  Independent with final HEP Goal status: revised 04/15/2023  2.  FOTO score improved to 49 Goal status: revised 04/15/2023  3.  Lt knee AROM improved to 0-100 for improved mobility for daily activity  Goal status: revised 04/15/2023  4.  Report pain < 3/10 with standing and walking for improved function Goal status: revised 04/15/2023  5.  Demonstrate ability to amb with LRAD modified independent for improved mobility Goal status: revised 04/15/2023  6.  Patient will demonstrate/report ability to ascend/descend stairs with single hand rail and reciprocal gait pattern.  Goal status: revised 04/15/2023  7.  Patient will demonstrate Lt knee MMT 5/5 throughout to facilitate stairs, transfers and ambulation at PLOF.    PLAN:  PT FREQUENCY: every business day for 1 week post manip, then 2-3 x per week   PT DURATION: 8 weeks  PLANNED INTERVENTIONS: Therapeutic exercises, Therapeutic activity, Neuromuscular re-education, Balance training, Gait training, Patient/Family education, Self Care, Joint mobilization, Stair training, DME instructions, Aquatic  Therapy, Dry Needling, Electrical stimulation, Cryotherapy, Moist heat, Taping, Vasopneumatic device, Manual therapy, and Re-evaluation  PLAN FOR NEXT SESSION: measure flexion continue Quad activation, mobility gains. Manual therapy & therapeutic exercise.   Moshe Cipro, PT, DPT 04/22/2023, 8:51 AM

## 2023-04-23 ENCOUNTER — Ambulatory Visit (INDEPENDENT_AMBULATORY_CARE_PROVIDER_SITE_OTHER): Payer: BC Managed Care – PPO | Admitting: Physical Therapy

## 2023-04-23 ENCOUNTER — Encounter: Payer: Self-pay | Admitting: Physical Therapy

## 2023-04-23 DIAGNOSIS — M25662 Stiffness of left knee, not elsewhere classified: Secondary | ICD-10-CM

## 2023-04-23 DIAGNOSIS — M25562 Pain in left knee: Secondary | ICD-10-CM | POA: Diagnosis not present

## 2023-04-23 DIAGNOSIS — R2689 Other abnormalities of gait and mobility: Secondary | ICD-10-CM

## 2023-04-23 DIAGNOSIS — R600 Localized edema: Secondary | ICD-10-CM

## 2023-04-23 DIAGNOSIS — M6281 Muscle weakness (generalized): Secondary | ICD-10-CM

## 2023-04-23 DIAGNOSIS — R2681 Unsteadiness on feet: Secondary | ICD-10-CM

## 2023-04-23 NOTE — Therapy (Signed)
OUTPATIENT PHYSICAL THERAPY TREATMENT   Patient Name: [name] MRN: [id number] DOB:03/25/1961, 62 y.o., female Today's Date: 04/23/2023    END OF SESSION:  PT End of Session - 04/23/23 0756     Visit Number 19    Number of Visits 28    Date for PT Re-Evaluation 06/10/23    Authorization Type [employer] State $72 copay    Progress Note Due on Visit 23    PT Start Time 0757    PT Stop Time 0838    PT Time Calculation (min) 41 min    Activity Tolerance Patient limited by pain    Behavior During Therapy [facility] for tasks assessed/performed                        Past Medical History:  Diagnosis Date   Arthritis    Campylobacter enteritis May 2012   [facility] admission   Carpal tunnel syndrome, bilateral    Diabetes mellitus    Gastritis with bleeding due to alcohol 2010   [facility] admission   GERD (gastroesophageal reflux disease)    Hyperlipidemia    Hypertension    no meds   Menopause    Myocardial infarction (HCC)    Neck pain    Wears glasses    Past Surgical History:  Procedure Laterality Date   CARPAL TUNNEL RELEASE Left 05/14/2014   Procedure: LEFT ULNAR NEUROPLASTY AT ELBOW AND ENDOSCOPIC CARPAL TUNNEL RELEASE;  Surgeon: Jodi Marble, MD;  Location: [facility];  Service: Orthopedics;  Laterality: Left;   CORONARY STENT INTERVENTION N/A 10/09/2019   Procedure: CORONARY STENT INTERVENTION;  Surgeon: Yates Decamp, MD;  Location: [facility];  Service: Cardiovascular;  Laterality: N/A;   CYST EXCISION  1995   under arms   endometrial biopsy  2004   benign   FOOT OSTEOTOMY  2010   right-spurs   LEFT HEART CATH AND CORONARY ANGIOGRAPHY N/A 10/09/2019   Procedure: LEFT HEART CATH AND CORONARY ANGIOGRAPHY;  Surgeon: Yates Decamp, MD;  Location: [facility];  Service: Cardiovascular;  Laterality: N/A;   TONSILLECTOMY     TOTAL KNEE ARTHROPLASTY Left 02/26/2023   Procedure: LEFT TOTAL KNEE ARTHROPLASTY;  Surgeon: Kathryne Hitch,  MD;  Location: [facility];  Service: Orthopedics;  Laterality: Left;  Needs RNFA   ULNAR NERVE TRANSPOSITION Left 05/14/2014   Procedure: ULNAR NERVE DECOMPRESSION/TRANSPOSITION;  Surgeon: Jodi Marble, MD;  Location: [facility];  Service: Orthopedics;  Laterality: Left;   Patient Active Problem List   Diagnosis Date Noted   Arthrofibrosis of total knee arthroplasty, subsequent encounter 04/07/2023   Status post left knee replacement 02/26/2023   Unilateral primary osteoarthritis, left knee 09/09/2022   Intractable nausea and vomiting 07/04/2022   Hematemesis 07/04/2022   Hypokalemia 07/04/2022   Lactic acidosis 07/04/2022   Pain due to onychomycosis of toenails of both feet 04/28/2022   Diabetic neuropathy, type II diabetes mellitus (HCC) 04/28/2022   De Quervain's tenosynovitis, left 11/13/2021   Bilateral injections given May 21, 2020 degenerative arthritis of knee, bilateral 05/21/2020   Traumatic hematoma of female breast 11/29/2019   Dizziness 11/29/2019   Hospital discharge follow-up 10/23/2019   NSTEMI (non-ST elevated myocardial infarction) (HCC) 10/10/2019   CAD (coronary artery disease) 10/10/2019   Chest pain 10/09/2019   Elevated troponin 10/09/2019   Uncontrolled type 2 diabetes mellitus with hyperglycemia (HCC) 10/09/2019   Obesity (BMI 30.0-34.9) 10/09/2019   Cervical spondylitis with  radiculitis (HCC) 08/29/2019   Acute bursitis of left shoulder 01/30/2019   Chronic midline posterior neck pain 06/28/2018   Urgency incontinence 06/28/2018   Degenerative joint disease of knee, left 04/26/2018   Carpal tunnel syndrome on both sides 03/29/2018   Anemia 01/02/2018   Class 1 obesity with serious comorbidity and body mass index (BMI) of 32.0 to 32.9 in adult 07/23/2017   Left hip pain 05/04/2017   Carpal tunnel syndrome on right 12/27/2016   Polyarthralgia 11/09/2016   Uncontrolled type 2 diabetes mellitus with retinopathy, with long-term current use of  insulin 07/07/2016   Visit for preventive health examination 06/21/2016   Increased urinary frequency 06/21/2016   Chronic meniscal tear of knee 08/19/2015   Left knee pain 06/19/2015   Candidiasis of skin 06/18/2015   Genital herpes 07/31/2014   Ulnar nerve compression 09/21/2013   Noncompliance with diet and medication regimen 11/15/2012   Boil, axilla 08/14/2012   Arthritis    Screening for breast cancer 08/04/2011   Screening for colon cancer 08/04/2011   HIDRADENITIS SUPPURATIVA 04/11/2007   GERD 11/17/2006   ENDOMETRIAL POLYP 11/17/2006   LOW BACK PAIN 11/17/2006   FIBROIDS, UTERUS 11/16/2006   Hyperlipidemia associated with type 2 diabetes mellitus (HCC) 11/16/2006   Essential hypertension 11/16/2006    PCP: Georganna Skeans, MD  REFERRING PROVIDER: Kathryne Hitch, MD  REFERRING DIAG: [phone] (ICD-10-CM) - Status post left knee replacement   THERAPY DIAG:  Acute pain of left knee  Stiffness of left knee, not elsewhere classified  Muscle weakness (generalized)  Other abnormalities of gait and mobility  Unsteadiness on feet  Localized edema  Rationale for Evaluation and Treatment: Rehabilitation  ONSET DATE: 02/26/23  SUBJECTIVE:   SUBJECTIVE STATEMENT:   Slept on her stomach last night and now her knee is hurting more; pain is in the front of knee and back of knee into thigh   PERTINENT HISTORY: OA, DM, HLD, HTN, MI s/p stent  PAIN:  NPRS scale: 9/10 Pain location: Lt knee Pain description: throbbing Aggravating factors: constant, pressure, end range movements Relieving factors: repositioning, medications  PRECAUTIONS: Fall  WEIGHT BEARING RESTRICTIONS: No  FALLS:  Has patient fallen in last 6 months? Yes. 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[employer]; currently out on leave  PLOF: Independent and Leisure: fishing  PATIENT GOALS: improve mobility and pain  NEXT MD VISIT: Not scheduled  OBJECTIVE:   PATIENT SURVEYS:  04/05/23: FOTO 38 03/15/23: FOTO 24 (predicted 49)   COGNITION: Overall cognitive status: Within functional limits for tasks assessed     SENSATION: WFL  EDEMA:  03/15/23: Circumferential: Knee Joint Line: Rt: 34.5 cm/Lt: 39 cm   PALPATION: 03/15/23: very tender to light touch  LOWER EXTREMITY ROM:  ROM Left 03/15/23 Left 03/22/23 Left 03/24/23 Left 03/31/23 Left 04/05/23 Left 04/07/2023 After manual Left 04/09/23 [place] 04/15/2023 Left 04/16/23  Knee flexion P: 42 A: 40 P: 69*  End of session P:  78* After manual supine AA: 82* A: 63 P: 70 Supine passive heel slide  88  A: 73 P: 82 67 AROM  In supine heel slide - limited by pain A: 81 (supine heel slide)  Knee extension P: -6 A: Unable  supine P: -5*  Supine Quad set -3* A:  Seated LAQ: -15   -25 AROM in seated LAQ   -4 PROM in heel prop TKE supine    (Blank rows = not tested)  LOWER EXTREMITY MMT:    03/15/23: Not formally tested but grossly 2/5 Lt knee  MMT Left 03/29/2023 Left 04/15/2023  Knee flexion 4/5 NT  Knee extension 3+/5 in available range NT   (Blank rows = not tested)  FUNCTIONAL TESTS:  03/15/23:  increased difficulty rising from a chair; decreased gait velocity  GAIT: 04/15/2023:  Ambulation c FWW similar to initial arrival techniques.   04/02/2023:  SPC trials in clinic c SBA and cues for use and sequencing.  Antalgic gait noted but not that deviated from gait style c FWW.   03/15/23 Distance walked: 100' Assistive device utilized: Environmental consultant - 2 wheeled Level of assistance: SBA Comments: antalgic gait, decreased speed   TODAY'S TREATMENT: 04/23/23 TherEx NuStep L6 x 10 min; seat 7 for stretch into flexion Seated Lt LAQ 2x15 with percussive device Seated AA  knee flexion 10 x 10 sec hold; RLE providing assist  Manual Seated Lt knee flexion overpressure to tolerance   04/22/23 TherEx NuStep L6 x 10 min; seat 7 for stretch into flexion Leg press bil 50#; 3x10; 60 sec hold in flexion between sets Seated LAQ within available range 2x10; 15 sec; use of percussive device PRN   04/21/2023 TherEx NuStep seat 8 L5 x 10 min for stretching into flexion (intermittent 10 second holds) Leg press Double leg in available range 50 lbs x 10 reps 2 sets Seated AROM LAQ Lt end range AAROM with isometric hold, controlled eccentric then active knee flexion with contralateral LE opposing motion x 15 reps  in available range Seated gravity assisted knee flexion stretch 1 min x 3   Self-Care PT demo & verbal cues on ice massage. PT recommended doing ice massage 5 min prior & after exercises. Pt return demo & verbalized understanding.   Manual Seated Lt knee flexion overpressure to tolerance, gentle contract/relax techniques for flexion mobility  Hamstring stretch passive during rest for leg press  Vasopneumatic Device 10 mins Lt knee in elevation medium compression 34 degrees.      HOME EXERCISE PROGRAM: Access Code: [email or web address] URL: [email or web address] Date: 03/29/2023 Prepared by: Chyrel Masson  Exercises - Quad Set (Mirrored)  - 5-10 x daily - 7 x weekly - 1 sets - 5-10 reps - 5 sec hold - Supine Heel Slide with Strap  - 5-10 x daily - 7 x weekly - 1 sets - 5-10 reps - Seated Heel Slide (Mirrored)  - 5-10 x daily - 7 x weekly - 1 sets - 5-10 reps - Seated Quad Set (Mirrored)  - 5-10 x daily - 7 x weekly - 1 sets - 5-10 reps - 5 sec hold - Seated Long Arc Quad (Mirrored)  - 3-5 x daily - 7 x weekly - 1-2 sets - 10 reps - 2 hold - Seated Knee Flexion Extension AAROM with Overpressure (Mirrored)  - 5-6 x daily - 7 x weekly - 1 sets - 5 reps - 10 hold - Seated Straight Leg Heel Taps  - 1-2 x daily - 7 x weekly - 3 sets - 5-10  reps  ASSESSMENT: CLINICAL IMPRESSION: Pt continues to have high reports of pain limiting tolerance to manual therapy for ROM.  Still working on quad activation post MUA.  Deferred measurements today due to elevated pain. Will continue to benefit from PT to maximize function.  Leg related, Pt  continued to show high guarding and pain response to activity in clinic, regardless of type.  Pt has flexion mobility limits still noted and has shown increased difficulty in quad activation in last week or so as well, possibly inhibited by pain symptoms.   OBJECTIVE IMPAIRMENTS: Abnormal gait, decreased balance, decreased endurance, decreased mobility, difficulty walking, decreased ROM, decreased strength, hypomobility, increased edema, increased fascial restrictions, increased muscle spasms, and pain.   ACTIVITY LIMITATIONS: carrying, lifting, bending, sitting, standing, squatting, sleeping, stairs, transfers, bed mobility, toileting, dressing, and locomotion level  PARTICIPATION LIMITATIONS: meal prep, cleaning, laundry, driving, shopping, community activity, and occupation  PERSONAL FACTORS: 3+ comorbidities: OA, DM, HLD, HTN, MI s/p stent  are also affecting patient's functional outcome.   REHAB POTENTIAL: Fair elevated pain, limited ROM and tolerance to activity  CLINICAL DECISION MAKING: Evolving/moderate complexity  EVALUATION COMPLEXITY: Moderate   GOALS: Goals reviewed with patient? Yes  SHORT TERM GOALS: Target date: 04/12/2023  Independent with initial HEP Goal status  Met  2.  Lt knee AROM 5-60 deg for improved function Goal status: mostly met  LONG TERM GOALS: Target date: 06/10/2023  Independent with final HEP Goal status: revised 04/15/2023  2.  FOTO score improved to 49 Goal status: revised 04/15/2023  3.  Lt knee AROM improved to 0-100 for improved mobility for daily activity  Goal status: revised 04/15/2023  4.  Report pain < 3/10 with standing and walking for improved  function Goal status: revised 04/15/2023  5.  Demonstrate ability to amb with LRAD modified independent for improved mobility Goal status: revised 04/15/2023  6.  Patient will demonstrate/report ability to ascend/descend stairs with single hand rail and reciprocal gait pattern.  Goal status: revised 04/15/2023  7.  Patient will demonstrate Lt knee MMT 5/5 throughout to facilitate stairs, transfers and ambulation at PLOF.    PLAN:  PT FREQUENCY: every business day for 1 week post manip, then 2-3 x per week   PT DURATION: 8 weeks  PLANNED INTERVENTIONS: Therapeutic exercises, Therapeutic activity, Neuromuscular re-education, Balance training, Gait training, Patient/Family education, Self Care, Joint mobilization, Stair training, DME instructions, Aquatic Therapy, Dry Needling, Electrical stimulation, Cryotherapy, Moist heat, Taping, Vasopneumatic device, Manual therapy, and Re-evaluation  PLAN FOR NEXT SESSION: get updated measurements, continue Quad activation, mobility gains. Manual therapy & therapeutic exercise.   Moshe Cipro, PT, DPT 04/23/2023, 8:48 AM

## 2023-04-27 ENCOUNTER — Ambulatory Visit (INDEPENDENT_AMBULATORY_CARE_PROVIDER_SITE_OTHER): Payer: BC Managed Care – PPO | Admitting: Physical Therapy

## 2023-04-27 ENCOUNTER — Encounter: Payer: Self-pay | Admitting: Physical Therapy

## 2023-04-27 DIAGNOSIS — M6281 Muscle weakness (generalized): Secondary | ICD-10-CM

## 2023-04-27 DIAGNOSIS — M25562 Pain in left knee: Secondary | ICD-10-CM | POA: Diagnosis not present

## 2023-04-27 DIAGNOSIS — R2689 Other abnormalities of gait and mobility: Secondary | ICD-10-CM | POA: Diagnosis not present

## 2023-04-27 DIAGNOSIS — M25662 Stiffness of left knee, not elsewhere classified: Secondary | ICD-10-CM

## 2023-04-27 DIAGNOSIS — R2681 Unsteadiness on feet: Secondary | ICD-10-CM

## 2023-04-27 DIAGNOSIS — R600 Localized edema: Secondary | ICD-10-CM

## 2023-04-27 NOTE — Therapy (Signed)
OUTPATIENT PHYSICAL THERAPY TREATMENT   Patient Name: [name] MRN: [id number] DOB:1961-11-15, 62 y.o., female Today's Date: 04/27/2023    END OF SESSION:  PT End of Session - 04/27/23 0813     Visit Number 20    Number of Visits 28    Date for PT Re-Evaluation 06/10/23    Authorization Type [employer] State $72 copay    Progress Note Due on Visit 23    PT Start Time 0804    PT Stop Time 0850    PT Time Calculation (min) 46 min    Activity Tolerance Patient limited by pain    Behavior During Therapy [facility] for tasks assessed/performed                         Past Medical History:  Diagnosis Date   Arthritis    Campylobacter enteritis May 2012   [facility] admission   Carpal tunnel syndrome, bilateral    Diabetes mellitus    Gastritis with bleeding due to alcohol 2010   [facility] admission   GERD (gastroesophageal reflux disease)    Hyperlipidemia    Hypertension    no meds   Menopause    Myocardial infarction (HCC)    Neck pain    Wears glasses    Past Surgical History:  Procedure Laterality Date   CARPAL TUNNEL RELEASE Left 05/14/2014   Procedure: LEFT ULNAR NEUROPLASTY AT ELBOW AND ENDOSCOPIC CARPAL TUNNEL RELEASE;  Surgeon: Jodi Marble, MD;  Location: [facility];  Service: Orthopedics;  Laterality: Left;   CORONARY STENT INTERVENTION N/A 10/09/2019   Procedure: CORONARY STENT INTERVENTION;  Surgeon: Yates Decamp, MD;  Location: [facility];  Service: Cardiovascular;  Laterality: N/A;   CYST EXCISION  1995   under arms   endometrial biopsy  2004   benign   FOOT OSTEOTOMY  2010   right-spurs   LEFT HEART CATH AND CORONARY ANGIOGRAPHY N/A 10/09/2019   Procedure: LEFT HEART CATH AND CORONARY ANGIOGRAPHY;  Surgeon: Yates Decamp, MD;  Location: [facility];  Service: Cardiovascular;  Laterality: N/A;   TONSILLECTOMY     TOTAL KNEE ARTHROPLASTY Left 02/26/2023   Procedure: LEFT TOTAL KNEE ARTHROPLASTY;  Surgeon: Kathryne Hitch, MD;  Location: [facility];  Service: Orthopedics;  Laterality: Left;  Needs RNFA   ULNAR NERVE TRANSPOSITION Left 05/14/2014   Procedure: ULNAR NERVE DECOMPRESSION/TRANSPOSITION;  Surgeon: Jodi Marble, MD;  Location: [facility];  Service: Orthopedics;  Laterality: Left;   Patient Active Problem List   Diagnosis Date Noted   Arthrofibrosis of total knee arthroplasty, subsequent encounter 04/07/2023   Status post left knee replacement 02/26/2023   Unilateral primary osteoarthritis, left knee 09/09/2022   Intractable nausea and vomiting 07/04/2022   Hematemesis 07/04/2022   Hypokalemia 07/04/2022   Lactic acidosis 07/04/2022   Pain due to onychomycosis of toenails of both feet 04/28/2022   Diabetic neuropathy, type II diabetes mellitus (HCC) 04/28/2022   De Quervain's tenosynovitis, left 11/13/2021   Bilateral injections given May 21, 2020 degenerative arthritis of knee, bilateral 05/21/2020   Traumatic hematoma of female breast 11/29/2019   Dizziness 11/29/2019   Hospital discharge follow-up 10/23/2019   NSTEMI (non-ST elevated myocardial infarction) (HCC) 10/10/2019   CAD (coronary artery disease) 10/10/2019   Chest pain 10/09/2019   Elevated troponin 10/09/2019   Uncontrolled type 2 diabetes mellitus with hyperglycemia (HCC) 10/09/2019   Obesity (BMI 30.0-34.9) 10/09/2019   Cervical spondylitis  with radiculitis (HCC) 08/29/2019   Acute bursitis of left shoulder 01/30/2019   Chronic midline posterior neck pain 06/28/2018   Urgency incontinence 06/28/2018   Degenerative joint disease of knee, left 04/26/2018   Carpal tunnel syndrome on both sides 03/29/2018   Anemia 01/02/2018   Class 1 obesity with serious comorbidity and body mass index (BMI) of 32.0 to 32.9 in adult 07/23/2017   Left hip pain 05/04/2017   Carpal tunnel syndrome on right 12/27/2016   Polyarthralgia 11/09/2016   Uncontrolled type 2 diabetes mellitus with retinopathy, with long-term current use  of insulin 07/07/2016   Visit for preventive health examination 06/21/2016   Increased urinary frequency 06/21/2016   Chronic meniscal tear of knee 08/19/2015   Left knee pain 06/19/2015   Candidiasis of skin 06/18/2015   Genital herpes 07/31/2014   Ulnar nerve compression 09/21/2013   Noncompliance with diet and medication regimen 11/15/2012   Boil, axilla 08/14/2012   Arthritis    Screening for breast cancer 08/04/2011   Screening for colon cancer 08/04/2011   HIDRADENITIS SUPPURATIVA 04/11/2007   GERD 11/17/2006   ENDOMETRIAL POLYP 11/17/2006   LOW BACK PAIN 11/17/2006   FIBROIDS, UTERUS 11/16/2006   Hyperlipidemia associated with type 2 diabetes mellitus (HCC) 11/16/2006   Essential hypertension 11/16/2006    PCP: Georganna Skeans, MD  REFERRING PROVIDER: Kathryne Hitch, MD  REFERRING DIAG: [phone] (ICD-10-CM) - Status post left knee replacement   THERAPY DIAG:  Acute pain of left knee  Stiffness of left knee, not elsewhere classified  Muscle weakness (generalized)  Other abnormalities of gait and mobility  Unsteadiness on feet  Localized edema  Rationale for Evaluation and Treatment: Rehabilitation  ONSET DATE: 02/26/23  SUBJECTIVE:   SUBJECTIVE STATEMENT:   Pt arriving reporting more stiffness today and increased pain and soreness. Pt stating she is still waking up each night with pain.    PERTINENT HISTORY: OA, DM, HLD, HTN, MI s/p stent  PAIN:  NPRS scale: 7-8/10 Pain location: Lt knee Pain description: throbbing Aggravating factors: constant, pressure, end range movements Relieving factors: repositioning, medications  PRECAUTIONS: Fall  WEIGHT BEARING RESTRICTIONS: No  FALLS:  Has patient fallen in last 6 months? Yes. 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[employer]; currently out on leave  PLOF: Independent and Leisure: fishing  PATIENT GOALS: improve mobility and pain  NEXT MD VISIT: Not scheduled  OBJECTIVE:   PATIENT SURVEYS:  04/05/23: FOTO 38 03/15/23: FOTO 24 (predicted 49)   COGNITION: Overall cognitive status: Within functional limits for tasks assessed     SENSATION: WFL  EDEMA:  03/15/23: Circumferential: Knee Joint Line: Rt: 34.5 cm/Lt: 39 cm   PALPATION: 03/15/23: very tender to light touch  LOWER EXTREMITY ROM:  ROM Left 03/15/23 Left 03/22/23 Left 03/24/23 Left 03/31/23 Left 04/05/23 Left 04/07/2023 After manual Left 04/09/23 [place] 04/15/2023 Left 04/16/23 Left 04/27/23  Knee flexion P: 42 A: 40 P: 69*  End of session P:  78* After manual supine AA: 82* A: 63 P: 70 Supine passive heel slide  88  A: 73 P: 82 67 AROM  In supine heel slide - limited by pain A: 81 (supine heel slide) A: 80 Supine, increased pain   Knee extension P: -6 A: Unable  supine P: -5*  Supine Quad set -3* A: Seated LAQ: -15   -25 AROM in seated LAQ   -4 PROM in heel prop TKE supine  A: -10 P: -4 supine   (Blank rows = not tested)  LOWER EXTREMITY MMT:    03/15/23: Not formally tested but grossly 2/5 Lt knee  MMT Left 03/29/2023 Left 04/15/2023  Knee flexion 4/5 NT  Knee extension 3+/5 in available range NT   (Blank rows = not tested)  FUNCTIONAL TESTS:  03/15/23:  increased difficulty rising from a chair; decreased gait velocity  GAIT: 04/15/2023:  Ambulation c FWW similar to initial arrival techniques.   04/02/2023:  SPC trials in clinic c SBA and cues for use and sequencing.  Antalgic gait noted but not that deviated from gait style c FWW.   03/15/23 Distance walked: 100' Assistive device utilized: Environmental consultant - 2 wheeled Level of assistance: SBA Comments: antalgic gait, decreased speed   TODAY'S TREATMENT: 04/27/23 TherEx Scifit bike: rocking  back and forth, partial revolutions seat at 10 x 10 minutes Seated Lt LAQ  x 25 while performing opposite motion with contralateral LE  Supine SAQ: x 20 holding 3-5 seconds Supine heel slides x 20 using stretch strap Manual Seated Lt knee flexion overpressure to tolerance, supine knee  Modalities: Vasopneumaitc: 34 deg, medium pressure, x 10 minutes   04/23/23 TherEx NuStep L6 x 10 min; seat 7 for stretch into flexion Seated Lt LAQ 2x15 with percussive device Seated AA knee flexion 10 x 10 sec hold; RLE providing assist  Manual Seated Lt knee flexion overpressure to tolerance   04/22/23 TherEx NuStep L6 x 10 min; seat 7 for stretch into flexion Leg press bil 50#; 3x10; 60 sec hold in flexion between sets Seated LAQ within available range 2x10; 15 sec; use of percussive device PRN   04/21/2023 TherEx NuStep seat 8 L5 x 10 min for stretching into flexion (intermittent 10 second holds) Leg press Double leg in available range 50 lbs x 10 reps 2 sets Seated AROM LAQ Lt end range AAROM with isometric hold, controlled eccentric then active knee flexion with contralateral LE opposing motion x 15 reps  in available range Seated gravity assisted knee flexion stretch 1 min x 3   Self-Care PT demo & verbal cues on ice massage. PT recommended doing ice massage 5 min prior & after exercises. Pt return demo & verbalized understanding.   Manual Seated Lt knee flexion overpressure to tolerance, gentle contract/relax techniques for flexion mobility  Hamstring stretch passive during rest for leg press  Vasopneumatic Device 10 mins Lt knee in elevation medium compression 34 degrees.      HOME EXERCISE PROGRAM: Access Code: [email or web address] URL: [email or web address] Date: 03/29/2023 Prepared by: Chyrel Masson  Exercises - Quad Set (Mirrored)  - 5-10 x daily - 7 x weekly - 1 sets - 5-10 reps - 5 sec hold - Supine Heel Slide with Strap  - 5-10 x daily - 7 x weekly - 1 sets - 5-10  reps - Seated Heel Slide (Mirrored)  - 5-10 x daily - 7 x weekly - 1 sets - 5-10 reps - Seated Quad Set (Mirrored)  - 5-10 x daily - 7 x weekly - 1 sets - 5-10 reps - 5 sec hold - Seated Long Arc Quad (Mirrored)  - 3-5 x daily - 7 x weekly - 1-2 sets - 10 reps - 2 hold - Seated Knee Flexion Extension AAROM with Overpressure (Mirrored)  - 5-6 x daily - 7 x weekly -  1 sets - 5 reps - 10 hold - Seated Straight Leg Heel Taps  - 1-2 x daily - 7 x weekly - 3 sets - 5-10 reps  ASSESSMENT: CLINICAL IMPRESSION: Pt still working on quad activation. Still struggling with SLR due to muscle fatigue and quad activation. Pt with limited knee flexion due to increased pain both passively and actively. Pt's active knee extension has improved to lacking 10 degrees compared to lacking 27 degrees 2 weeks ago. Recommending continued PT to maximize function.    OBJECTIVE IMPAIRMENTS: Abnormal gait, decreased balance, decreased endurance, decreased mobility, difficulty walking, decreased ROM, decreased strength, hypomobility, increased edema, increased fascial restrictions, increased muscle spasms, and pain.   ACTIVITY LIMITATIONS: carrying, lifting, bending, sitting, standing, squatting, sleeping, stairs, transfers, bed mobility, toileting, dressing, and locomotion level  PARTICIPATION LIMITATIONS: meal prep, cleaning, laundry, driving, shopping, community activity, and occupation  PERSONAL FACTORS: 3+ comorbidities: OA, DM, HLD, HTN, MI s/p stent  are also affecting patient's functional outcome.   REHAB POTENTIAL: Fair elevated pain, limited ROM and tolerance to activity  CLINICAL DECISION MAKING: Evolving/moderate complexity  EVALUATION COMPLEXITY: Moderate   GOALS: Goals reviewed with patient? Yes  SHORT TERM GOALS: Target date: 04/12/2023  Independent with initial HEP Goal status  Met  2.  Lt knee AROM 5-60 deg for improved function Goal status: mostly met  LONG TERM GOALS: Target date:  06/10/2023  Independent with final HEP Goal status: revised 04/15/2023  2.  FOTO score improved to 49 Goal status: revised 04/15/2023  3.  Lt knee AROM improved to 0-100 for improved mobility for daily activity  Goal status: revised 04/15/2023  4.  Report pain < 3/10 with standing and walking for improved function Goal status: revised 04/15/2023  5.  Demonstrate ability to amb with LRAD modified independent for improved mobility Goal status: revised 04/15/2023  6.  Patient will demonstrate/report ability to ascend/descend stairs with single hand rail and reciprocal gait pattern.  Goal status: revised 04/15/2023  7.  Patient will demonstrate Lt knee MMT 5/5 throughout to facilitate stairs, transfers and ambulation at PLOF.    PLAN:  PT FREQUENCY: every business day for 1 week post manip, then 2-3 x per week   PT DURATION: 8 weeks  PLANNED INTERVENTIONS: Therapeutic exercises, Therapeutic activity, Neuromuscular re-education, Balance training, Gait training, Patient/Family education, Self Care, Joint mobilization, Stair training, DME instructions, Aquatic Therapy, Dry Needling, Electrical stimulation, Cryotherapy, Moist heat, Taping, Vasopneumatic device, Manual therapy, and Re-evaluation  PLAN FOR NEXT SESSION: get updated measurements, continue Quad activation, mobility gains. Manual therapy & therapeutic exercise.  Pt is scheduled to for MD appointment on Thursday  5/30 following her PT visit. Will need updated note routed to Dr. Magnus Ivan.    Sharmon Leyden, PT, MPT 04/27/2023, 9:44 AM

## 2023-04-28 ENCOUNTER — Encounter: Payer: Self-pay | Admitting: Physical Therapy

## 2023-04-28 ENCOUNTER — Ambulatory Visit (INDEPENDENT_AMBULATORY_CARE_PROVIDER_SITE_OTHER): Payer: BC Managed Care – PPO | Admitting: Physical Therapy

## 2023-04-28 DIAGNOSIS — M25562 Pain in left knee: Secondary | ICD-10-CM | POA: Diagnosis not present

## 2023-04-28 DIAGNOSIS — R2681 Unsteadiness on feet: Secondary | ICD-10-CM

## 2023-04-28 DIAGNOSIS — R600 Localized edema: Secondary | ICD-10-CM

## 2023-04-28 DIAGNOSIS — M25662 Stiffness of left knee, not elsewhere classified: Secondary | ICD-10-CM

## 2023-04-28 DIAGNOSIS — M6281 Muscle weakness (generalized): Secondary | ICD-10-CM | POA: Diagnosis not present

## 2023-04-28 DIAGNOSIS — R2689 Other abnormalities of gait and mobility: Secondary | ICD-10-CM | POA: Diagnosis not present

## 2023-04-28 NOTE — Therapy (Signed)
OUTPATIENT PHYSICAL THERAPY TREATMENT   Patient Name: [name] MRN: [id number] DOB:1961-08-20, 62 y.o., female Today's Date: 04/28/2023    END OF SESSION:  PT End of Session - 04/28/23 0852     Visit Number 21    Number of Visits 28    Date for PT Re-Evaluation 06/10/23    Authorization Type [employer] State $72 copay    Progress Note Due on Visit 23    PT Start Time 0848    PT Stop Time 0932    PT Time Calculation (min) 44 min    Activity Tolerance Patient limited by pain    Behavior During Therapy [facility] for tasks assessed/performed                          Past Medical History:  Diagnosis Date   Arthritis    Campylobacter enteritis May 2012   [facility] admission   Carpal tunnel syndrome, bilateral    Diabetes mellitus    Gastritis with bleeding due to alcohol 2010   [facility] admission   GERD (gastroesophageal reflux disease)    Hyperlipidemia    Hypertension    no meds   Menopause    Myocardial infarction (HCC)    Neck pain    Wears glasses    Past Surgical History:  Procedure Laterality Date   CARPAL TUNNEL RELEASE Left 05/14/2014   Procedure: LEFT ULNAR NEUROPLASTY AT ELBOW AND ENDOSCOPIC CARPAL TUNNEL RELEASE;  Surgeon: Jodi Marble, MD;  Location: [facility];  Service: Orthopedics;  Laterality: Left;   CORONARY STENT INTERVENTION N/A 10/09/2019   Procedure: CORONARY STENT INTERVENTION;  Surgeon: Yates Decamp, MD;  Location: [facility];  Service: Cardiovascular;  Laterality: N/A;   CYST EXCISION  1995   under arms   endometrial biopsy  2004   benign   FOOT OSTEOTOMY  2010   right-spurs   LEFT HEART CATH AND CORONARY ANGIOGRAPHY N/A 10/09/2019   Procedure: LEFT HEART CATH AND CORONARY ANGIOGRAPHY;  Surgeon: Yates Decamp, MD;  Location: [facility];  Service: Cardiovascular;  Laterality: N/A;   TONSILLECTOMY     TOTAL KNEE ARTHROPLASTY Left 02/26/2023   Procedure: LEFT TOTAL KNEE ARTHROPLASTY;  Surgeon: Kathryne Hitch, MD;  Location: [facility];  Service: Orthopedics;  Laterality: Left;  Needs RNFA   ULNAR NERVE TRANSPOSITION Left 05/14/2014   Procedure: ULNAR NERVE DECOMPRESSION/TRANSPOSITION;  Surgeon: Jodi Marble, MD;  Location: [facility];  Service: Orthopedics;  Laterality: Left;   Patient Active Problem List   Diagnosis Date Noted   Arthrofibrosis of total knee arthroplasty, subsequent encounter 04/07/2023   Status post left knee replacement 02/26/2023   Unilateral primary osteoarthritis, left knee 09/09/2022   Intractable nausea and vomiting 07/04/2022   Hematemesis 07/04/2022   Hypokalemia 07/04/2022   Lactic acidosis 07/04/2022   Pain due to onychomycosis of toenails of both feet 04/28/2022   Diabetic neuropathy, type II diabetes mellitus (HCC) 04/28/2022   De Quervain's tenosynovitis, left 11/13/2021   Bilateral injections given May 21, 2020 degenerative arthritis of knee, bilateral 05/21/2020   Traumatic hematoma of female breast 11/29/2019   Dizziness 11/29/2019   Hospital discharge follow-up 10/23/2019   NSTEMI (non-ST elevated myocardial infarction) (HCC) 10/10/2019   CAD (coronary artery disease) 10/10/2019   Chest pain 10/09/2019   Elevated troponin 10/09/2019   Uncontrolled type 2 diabetes mellitus with hyperglycemia (HCC) 10/09/2019   Obesity (BMI 30.0-34.9) 10/09/2019   Cervical  spondylitis with radiculitis (HCC) 08/29/2019   Acute bursitis of left shoulder 01/30/2019   Chronic midline posterior neck pain 06/28/2018   Urgency incontinence 06/28/2018   Degenerative joint disease of knee, left 04/26/2018   Carpal tunnel syndrome on both sides 03/29/2018   Anemia 01/02/2018   Class 1 obesity with serious comorbidity and body mass index (BMI) of 32.0 to 32.9 in adult 07/23/2017   Left hip pain 05/04/2017   Carpal tunnel syndrome on right 12/27/2016   Polyarthralgia 11/09/2016   Uncontrolled type 2 diabetes mellitus with retinopathy, with long-term  current use of insulin 07/07/2016   Visit for preventive health examination 06/21/2016   Increased urinary frequency 06/21/2016   Chronic meniscal tear of knee 08/19/2015   Left knee pain 06/19/2015   Candidiasis of skin 06/18/2015   Genital herpes 07/31/2014   Ulnar nerve compression 09/21/2013   Noncompliance with diet and medication regimen 11/15/2012   Boil, axilla 08/14/2012   Arthritis    Screening for breast cancer 08/04/2011   Screening for colon cancer 08/04/2011   HIDRADENITIS SUPPURATIVA 04/11/2007   GERD 11/17/2006   ENDOMETRIAL POLYP 11/17/2006   LOW BACK PAIN 11/17/2006   FIBROIDS, UTERUS 11/16/2006   Hyperlipidemia associated with type 2 diabetes mellitus (HCC) 11/16/2006   Essential hypertension 11/16/2006    PCP: Georganna Skeans, MD  REFERRING PROVIDER: Kathryne Hitch, MD  REFERRING DIAG: [phone] (ICD-10-CM) - Status post left knee replacement   THERAPY DIAG:  Acute pain of left knee  Stiffness of left knee, not elsewhere classified  Muscle weakness (generalized)  Other abnormalities of gait and mobility  Unsteadiness on feet  Localized edema  Rationale for Evaluation and Treatment: Rehabilitation  ONSET DATE: 02/26/23  SUBJECTIVE:   SUBJECTIVE STATEMENT:   Back and lateral side of knee is still tight and painful   PERTINENT HISTORY: OA, DM, HLD, HTN, MI s/p stent  PAIN:  NPRS scale: 7/10 Pain location: Lt knee Pain description: throbbing Aggravating factors: constant, pressure, end range movements Relieving factors: repositioning, medications  PRECAUTIONS: Fall  WEIGHT BEARING RESTRICTIONS: No  FALLS:  Has patient fallen in last 6 months? Yes. 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[employer]; currently out on leave  PLOF: Independent and Leisure: fishing  PATIENT GOALS: improve mobility and pain  NEXT MD VISIT: Not scheduled  OBJECTIVE:   PATIENT SURVEYS:  04/05/23: FOTO 38 03/15/23: FOTO 24 (predicted 49)   COGNITION: Overall cognitive status: Within functional limits for tasks assessed     SENSATION: WFL  EDEMA:  03/15/23: Circumferential: Knee Joint Line: Rt: 34.5 cm/Lt: 39 cm   PALPATION: 03/15/23: very tender to light touch  LOWER EXTREMITY ROM:  ROM Left 03/15/23 Left 03/22/23 Left 03/24/23 Left 03/31/23 Left 04/05/23 Left 04/07/2023 After manual Left 04/09/23 [place] 04/15/2023 Left 04/16/23 Left 04/27/23  Knee flexion P: 42 A: 40 P: 69*  End of session P:  78* After manual supine AA: 82* A: 63 P: 70 Supine passive heel slide  88  A: 73 P: 82 67 AROM  In supine heel slide - limited by pain A: 81 (supine heel slide) A: 80 Supine, increased pain   Knee extension P: -6 A: Unable  supine P: -5*  Supine Quad set -3* A: Seated LAQ: -15   -  25 AROM in seated LAQ   -4 PROM in heel prop TKE supine  A: -10 P: -4 supine   (Blank rows = not tested)  LOWER EXTREMITY MMT:    03/15/23: Not formally tested but grossly 2/5 Lt knee  MMT Left 03/29/2023 Left 04/15/2023  Knee flexion 4/5 NT  Knee extension 3+/5 in available range NT   (Blank rows = not tested)  FUNCTIONAL TESTS:  03/15/23:  increased difficulty rising from a chair; decreased gait velocity  GAIT: 04/15/2023:  Ambulation c FWW similar to initial arrival techniques.   04/02/2023:  SPC trials in clinic c SBA and cues for use and sequencing.  Antalgic gait noted but not that deviated from gait style c FWW.   03/15/23 Distance walked: 100' Assistive device utilized: Environmental consultant - 2 wheeled Level of assistance: SBA Comments: antalgic gait, decreased speed   TODAY'S TREATMENT: 04/28/23 TherEx Recumbent bike: rocking back and forth, partial revolutions seat at 2 x 10 minutes Leg  Press bil 56# 3x10; 60 sec flexion hold between sets  Manual Seated Lt knee flexion overpressure to tolerance with percussive device on quad x 5 min  Modalities Vasopneumaitc: 34 deg, medium pressure, x 10 minutes  04/27/23 TherEx Scifit bike: rocking back and forth, partial revolutions seat at 10 x 10 minutes Seated Lt LAQ  x 25 while performing opposite motion with contralateral LE  Supine SAQ: x 20 holding 3-5 seconds Supine heel slides x 20 using stretch strap Manual Seated Lt knee flexion overpressure to tolerance, supine knee  Modalities: Vasopneumaitc: 34 deg, medium pressure, x 10 minutes   04/23/23 TherEx NuStep L6 x 10 min; seat 7 for stretch into flexion Seated Lt LAQ 2x15 with percussive device Seated AA knee flexion 10 x 10 sec hold; RLE providing assist  Manual Seated Lt knee flexion overpressure to tolerance   04/22/23 TherEx NuStep L6 x 10 min; seat 7 for stretch into flexion Leg press bil 50#; 3x10; 60 sec hold in flexion between sets Seated LAQ within available range 2x10; 15 sec; use of percussive device PRN   HOME EXERCISE PROGRAM: Access Code: [email or web address] URL: [email or web address] Date: 03/29/2023 Prepared by: Chyrel Masson  Exercises - Quad Set (Mirrored)  - 5-10 x daily - 7 x weekly - 1 sets - 5-10 reps - 5 sec hold - Supine Heel Slide with Strap  - 5-10 x daily - 7 x weekly - 1 sets - 5-10 reps - Seated Heel Slide (Mirrored)  - 5-10 x daily - 7 x weekly - 1 sets - 5-10 reps - Seated Quad Set (Mirrored)  - 5-10 x daily - 7 x weekly - 1 sets - 5-10 reps - 5 sec hold - Seated Long Arc Quad (Mirrored)  - 3-5 x daily - 7 x weekly - 1-2 sets - 10 reps - 2 hold - Seated Knee Flexion Extension AAROM with Overpressure (Mirrored)  - 5-6 x daily - 7 x weekly - 1 sets - 5 reps - 10 hold - Seated Straight Leg Heel Taps  - 1-2 x daily - 7 x weekly - 3 sets - 5-10 reps  ASSESSMENT: CLINICAL IMPRESSION: Pt with elevated pain today limiting  session. She will see the MD tomorrow and discuss about her elevated pain levels.   Recommending continued PT to maximize function.   OBJECTIVE IMPAIRMENTS: Abnormal gait, decreased balance, decreased endurance, decreased mobility, difficulty walking, decreased ROM, decreased strength, hypomobility, increased edema, increased fascial restrictions, increased muscle spasms, and pain.   ACTIVITY LIMITATIONS: carrying,  lifting, bending, sitting, standing, squatting, sleeping, stairs, transfers, bed mobility, toileting, dressing, and locomotion level  PARTICIPATION LIMITATIONS: meal prep, cleaning, laundry, driving, shopping, community activity, and occupation  PERSONAL FACTORS: 3+ comorbidities: OA, DM, HLD, HTN, MI s/p stent  are also affecting patient's functional outcome.   REHAB POTENTIAL: Fair elevated pain, limited ROM and tolerance to activity  CLINICAL DECISION MAKING: Evolving/moderate complexity  EVALUATION COMPLEXITY: Moderate   GOALS: Goals reviewed with patient? Yes  SHORT TERM GOALS: Target date: 04/12/2023  Independent with initial HEP Goal status  Met  2.  Lt knee AROM 5-60 deg for improved function Goal status: mostly met  LONG TERM GOALS: Target date: 06/10/2023  Independent with final HEP Goal status: revised 04/15/2023  2.  FOTO score improved to 49 Goal status: revised 04/15/2023  3.  Lt knee AROM improved to 0-100 for improved mobility for daily activity  Goal status: revised 04/15/2023  4.  Report pain < 3/10 with standing and walking for improved function Goal status: revised 04/15/2023  5.  Demonstrate ability to amb with LRAD modified independent for improved mobility Goal status: revised 04/15/2023  6.  Patient will demonstrate/report ability to ascend/descend stairs with single hand rail and reciprocal gait pattern.  Goal status: revised 04/15/2023  7.  Patient will demonstrate Lt knee MMT 5/5 throughout to facilitate stairs, transfers and ambulation  at PLOF.    PLAN:  PT FREQUENCY: every business day for 1 week post manip, then 2-3 x per week   PT DURATION: 8 weeks  PLANNED INTERVENTIONS: Therapeutic exercises, Therapeutic activity, Neuromuscular re-education, Balance training, Gait training, Patient/Family education, Self Care, Joint mobilization, Stair training, DME instructions, Aquatic Therapy, Dry Needling, Electrical stimulation, Cryotherapy, Moist heat, Taping, Vasopneumatic device, Manual therapy, and Re-evaluation  PLAN FOR NEXT SESSION: will need MD note, continue Quad activation, mobility gains. Manual therapy & therapeutic exercise.  Pt is scheduled to for MD appointment on Thursday  5/30 following her PT visit. Will need updated note routed to Dr. Magnus Ivan.    Clarita Crane, PT, DPT 04/28/23 10:56 AM

## 2023-04-29 ENCOUNTER — Ambulatory Visit (INDEPENDENT_AMBULATORY_CARE_PROVIDER_SITE_OTHER): Payer: BC Managed Care – PPO | Admitting: Orthopaedic Surgery

## 2023-04-29 ENCOUNTER — Encounter: Payer: Self-pay | Admitting: Physical Therapy

## 2023-04-29 ENCOUNTER — Encounter: Payer: Self-pay | Admitting: Orthopaedic Surgery

## 2023-04-29 ENCOUNTER — Ambulatory Visit (INDEPENDENT_AMBULATORY_CARE_PROVIDER_SITE_OTHER): Payer: BC Managed Care – PPO | Admitting: Physical Therapy

## 2023-04-29 DIAGNOSIS — M25562 Pain in left knee: Secondary | ICD-10-CM

## 2023-04-29 DIAGNOSIS — M25662 Stiffness of left knee, not elsewhere classified: Secondary | ICD-10-CM | POA: Diagnosis not present

## 2023-04-29 DIAGNOSIS — R600 Localized edema: Secondary | ICD-10-CM

## 2023-04-29 DIAGNOSIS — M6281 Muscle weakness (generalized): Secondary | ICD-10-CM

## 2023-04-29 DIAGNOSIS — Z96652 Presence of left artificial knee joint: Secondary | ICD-10-CM

## 2023-04-29 DIAGNOSIS — T8482XD Fibrosis due to internal orthopedic prosthetic devices, implants and grafts, subsequent encounter: Secondary | ICD-10-CM

## 2023-04-29 DIAGNOSIS — R2689 Other abnormalities of gait and mobility: Secondary | ICD-10-CM

## 2023-04-29 DIAGNOSIS — R2681 Unsteadiness on feet: Secondary | ICD-10-CM

## 2023-04-29 MED ORDER — HYDROCODONE-ACETAMINOPHEN 5-325 MG PO TABS: 1.0000 | ORAL_TABLET | Freq: Four times a day (QID) | ORAL | 0 refills | Status: DC | PRN

## 2023-04-29 NOTE — Progress Notes (Signed)
The patient is here today for follow-up as a relates to her left total knee arthroplasty.  She is almost 2 months out from surgery but 2 weeks ago we did take her to the operating room and performed a manipulation under anesthesia.  I was able to get her knee flexed back almost all the way.  However, she has lost all that since that manipulation.  In the office today her extension is almost full but I cannot even flex her to anywhere close to 80 degrees.  Again we flexed her all the way back to almost 120 degrees a few weeks ago when we took her to the operating room for the MUA.  At this point she needs to try to push herself hard.  There is really nothing else that I would recommend now since she is not been able to push through any type of pain.  I will send in some hydrocodone for her knowing that we need to start weaning over the next several weeks.  The Manage seizures in 6 weeks.  Will have an AP and lateral of her left knee at that visit.

## 2023-04-29 NOTE — Therapy (Signed)
OUTPATIENT PHYSICAL THERAPY TREATMENT   Patient Name: [name] MRN: [id number] DOB:01/30/1961, 62 y.o., female Today's Date: 04/29/2023    END OF SESSION:  PT End of Session - 04/29/23 1253     Visit Number 22    Number of Visits 28    Date for PT Re-Evaluation 06/10/23    Authorization Type [employer] State $72 copay    Progress Note Due on Visit 23    PT Start Time 1300    PT Stop Time 1340    PT Time Calculation (min) 40 min    Activity Tolerance Patient limited by pain    Behavior During Therapy [facility] for tasks assessed/performed                           Past Medical History:  Diagnosis Date   Arthritis    Campylobacter enteritis May 2012   [facility] admission   Carpal tunnel syndrome, bilateral    Diabetes mellitus    Gastritis with bleeding due to alcohol 2010   [facility] admission   GERD (gastroesophageal reflux disease)    Hyperlipidemia    Hypertension    no meds   Menopause    Myocardial infarction (HCC)    Neck pain    Wears glasses    Past Surgical History:  Procedure Laterality Date   CARPAL TUNNEL RELEASE Left 05/14/2014   Procedure: LEFT ULNAR NEUROPLASTY AT ELBOW AND ENDOSCOPIC CARPAL TUNNEL RELEASE;  Surgeon: Jodi Marble, MD;  Location: [facility];  Service: Orthopedics;  Laterality: Left;   CORONARY STENT INTERVENTION N/A 10/09/2019   Procedure: CORONARY STENT INTERVENTION;  Surgeon: Yates Decamp, MD;  Location: [facility];  Service: Cardiovascular;  Laterality: N/A;   CYST EXCISION  1995   under arms   endometrial biopsy  2004   benign   FOOT OSTEOTOMY  2010   right-spurs   LEFT HEART CATH AND CORONARY ANGIOGRAPHY N/A 10/09/2019   Procedure: LEFT HEART CATH AND CORONARY ANGIOGRAPHY;  Surgeon: Yates Decamp, MD;  Location: [facility];  Service: Cardiovascular;  Laterality: N/A;   TONSILLECTOMY     TOTAL KNEE ARTHROPLASTY Left 02/26/2023   Procedure: LEFT TOTAL KNEE ARTHROPLASTY;  Surgeon: Kathryne Hitch, MD;  Location: [facility];  Service: Orthopedics;  Laterality: Left;  Needs RNFA   ULNAR NERVE TRANSPOSITION Left 05/14/2014   Procedure: ULNAR NERVE DECOMPRESSION/TRANSPOSITION;  Surgeon: Jodi Marble, MD;  Location: [facility];  Service: Orthopedics;  Laterality: Left;   Patient Active Problem List   Diagnosis Date Noted   Arthrofibrosis of total knee arthroplasty, subsequent encounter 04/07/2023   Status post left knee replacement 02/26/2023   Unilateral primary osteoarthritis, left knee 09/09/2022   Intractable nausea and vomiting 07/04/2022   Hematemesis 07/04/2022   Hypokalemia 07/04/2022   Lactic acidosis 07/04/2022   Pain due to onychomycosis of toenails of both feet 04/28/2022   Diabetic neuropathy, type II diabetes mellitus (HCC) 04/28/2022   De Quervain's tenosynovitis, left 11/13/2021   Bilateral injections given May 21, 2020 degenerative arthritis of knee, bilateral 05/21/2020   Traumatic hematoma of female breast 11/29/2019   Dizziness 11/29/2019   Hospital discharge follow-up 10/23/2019   NSTEMI (non-ST elevated myocardial infarction) (HCC) 10/10/2019   CAD (coronary artery disease) 10/10/2019   Chest pain 10/09/2019   Elevated troponin 10/09/2019   Uncontrolled type 2 diabetes mellitus with hyperglycemia (HCC) 10/09/2019   Obesity (BMI 30.0-34.9) 10/09/2019  Cervical spondylitis with radiculitis (HCC) 08/29/2019   Acute bursitis of left shoulder 01/30/2019   Chronic midline posterior neck pain 06/28/2018   Urgency incontinence 06/28/2018   Degenerative joint disease of knee, left 04/26/2018   Carpal tunnel syndrome on both sides 03/29/2018   Anemia 01/02/2018   Class 1 obesity with serious comorbidity and body mass index (BMI) of 32.0 to 32.9 in adult 07/23/2017   Left hip pain 05/04/2017   Carpal tunnel syndrome on right 12/27/2016   Polyarthralgia 11/09/2016   Uncontrolled type 2 diabetes mellitus with retinopathy, with long-term  current use of insulin 07/07/2016   Visit for preventive health examination 06/21/2016   Increased urinary frequency 06/21/2016   Chronic meniscal tear of knee 08/19/2015   Left knee pain 06/19/2015   Candidiasis of skin 06/18/2015   Genital herpes 07/31/2014   Ulnar nerve compression 09/21/2013   Noncompliance with diet and medication regimen 11/15/2012   Boil, axilla 08/14/2012   Arthritis    Screening for breast cancer 08/04/2011   Screening for colon cancer 08/04/2011   HIDRADENITIS SUPPURATIVA 04/11/2007   GERD 11/17/2006   ENDOMETRIAL POLYP 11/17/2006   LOW BACK PAIN 11/17/2006   FIBROIDS, UTERUS 11/16/2006   Hyperlipidemia associated with type 2 diabetes mellitus (HCC) 11/16/2006   Essential hypertension 11/16/2006    PCP: Georganna Skeans, MD  REFERRING PROVIDER: Kathryne Hitch, MD  REFERRING DIAG: [phone] (ICD-10-CM) - Status post left knee replacement   THERAPY DIAG:  Acute pain of left knee  Stiffness of left knee, not elsewhere classified  Muscle weakness (generalized)  Other abnormalities of gait and mobility  Unsteadiness on feet  Localized edema  Rationale for Evaluation and Treatment: Rehabilitation  ONSET DATE: 02/26/23  SUBJECTIVE:   SUBJECTIVE STATEMENT:   Pain still elevated, knee still feels tight   PERTINENT HISTORY: OA, DM, HLD, HTN, MI s/p stent  PAIN:  NPRS scale: 6-7/10 Pain location: Lt knee Pain description: throbbing Aggravating factors: constant, pressure, end range movements Relieving factors: repositioning, medications  PRECAUTIONS: Fall  WEIGHT BEARING RESTRICTIONS: No  FALLS:  Has patient fallen in last 6 months? Yes. 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[employer]; currently out on leave  PLOF: Independent and Leisure: fishing  PATIENT GOALS: improve mobility and pain  NEXT MD VISIT: Not scheduled  OBJECTIVE:   PATIENT SURVEYS:  04/05/23: FOTO 38 03/15/23: FOTO 24 (predicted 49)   COGNITION: Overall cognitive status: Within functional limits for tasks assessed     SENSATION: WFL  EDEMA:  03/15/23: Circumferential: Knee Joint Line: Rt: 34.5 cm/Lt: 39 cm   PALPATION: 03/15/23: very tender to light touch  LOWER EXTREMITY ROM:  ROM Left 03/15/23 Left 03/22/23 Left 03/24/23 Left 03/31/23 Left 04/05/23 Left 04/07/2023 After manual Left 04/09/23 [place] 04/15/2023 Left 04/16/23 Left 04/27/23 Left 04/28/20  Knee flexion P: 42 A: 40 P: 69*  End of session P:  78* After manual supine AA: 82* A: 63 P: 70 Supine passive heel slide  88  A: 73 P: 82 67 AROM  In supine heel slide - limited by pain A: 81 (supine heel slide) A: 80 Supine, increased pain  A: 70 P: 80  Knee extension P: -6 A: Unable  supine P: -5*  Supine Quad set -3* A: Seated LAQ: -  15   -25 AROM in seated LAQ   -4 PROM in heel prop TKE supine  A: -10 P: -4 supine P: 0 A: -18   (Blank rows = not tested)  LOWER EXTREMITY MMT:    03/15/23: Not formally tested but grossly 2/5 Lt knee  MMT Left 03/29/2023 Left 04/15/2023  Knee flexion 4/5 NT  Knee extension 3+/5 in available range NT   (Blank rows = not tested)  FUNCTIONAL TESTS:  03/15/23:  increased difficulty rising from a chair; decreased gait velocity  GAIT: 04/15/2023:  Ambulation c FWW similar to initial arrival techniques.   04/02/2023:  SPC trials in clinic c SBA and cues for use and sequencing.  Antalgic gait noted but not that deviated from gait style c FWW.   03/15/23 Distance walked: 100' Assistive device utilized: Environmental consultant - 2 wheeled Level of assistance: SBA Comments: antalgic gait, decreased speed   TODAY'S TREATMENT: 04/29/23 TherEx NuStep L6 x 10 min working on pushing into flexion Lt foot  on 8" step with flexion holds 10 x 15 sec hold Forward step ups onto 8" step x15 reps; up with Lt down with Rt for Lt knee flexion AA heel slides x 10 reps ROM measurements -see above for details  04/28/23 TherEx Recumbent bike: rocking back and forth, partial revolutions seat at 2 x 10 minutes Leg Press bil 56# 3x10; 60 sec flexion hold between sets  Manual Seated Lt knee flexion overpressure to tolerance with percussive device on quad x 5 min  Modalities Vasopneumaitc: 34 deg, medium pressure, x 10 minutes  04/27/23 TherEx Scifit bike: rocking back and forth, partial revolutions seat at 10 x 10 minutes Seated Lt LAQ  x 25 while performing opposite motion with contralateral LE  Supine SAQ: x 20 holding 3-5 seconds Supine heel slides x 20 using stretch strap Manual Seated Lt knee flexion overpressure to tolerance, supine knee  Modalities: Vasopneumaitc: 34 deg, medium pressure, x 10 minutes   04/23/23 TherEx NuStep L6 x 10 min; seat 7 for stretch into flexion Seated Lt LAQ 2x15 with percussive device Seated AA knee flexion 10 x 10 sec hold; RLE providing assist  Manual Seated Lt knee flexion overpressure to tolerance  HOME EXERCISE PROGRAM: Access Code: [email or web address] URL: [email or web address] Date: 03/29/2023 Prepared by: Chyrel Masson  Exercises - Quad Set (Mirrored)  - 5-10 x daily - 7 x weekly - 1 sets - 5-10 reps - 5 sec hold - Supine Heel Slide with Strap  - 5-10 x daily - 7 x weekly - 1 sets - 5-10 reps - Seated Heel Slide (Mirrored)  - 5-10 x daily - 7 x weekly - 1 sets - 5-10 reps - Seated Quad Set (Mirrored)  - 5-10 x daily - 7 x weekly - 1 sets - 5-10 reps - 5 sec hold - Seated Long Arc Quad (Mirrored)  - 3-5 x daily - 7 x weekly - 1-2 sets - 10 reps - 2 hold - Seated Knee Flexion Extension AAROM with Overpressure (Mirrored)  - 5-6 x daily - 7 x weekly - 1 sets - 5 reps - 10 hold - Seated Straight Leg Heel Taps  - 1-2 x daily - 7 x weekly - 3 sets -  5-10 reps  ASSESSMENT: CLINICAL IMPRESSION: Pt with limited change in ROM since manipulation at this time.  Plan to focus on functional mobility and strength within available range.  Will continue to benefit from PT to maximize function.  OBJECTIVE IMPAIRMENTS: Abnormal gait, decreased balance,  decreased endurance, decreased mobility, difficulty walking, decreased ROM, decreased strength, hypomobility, increased edema, increased fascial restrictions, increased muscle spasms, and pain.   ACTIVITY LIMITATIONS: carrying, lifting, bending, sitting, standing, squatting, sleeping, stairs, transfers, bed mobility, toileting, dressing, and locomotion level  PARTICIPATION LIMITATIONS: meal prep, cleaning, laundry, driving, shopping, community activity, and occupation  PERSONAL FACTORS: 3+ comorbidities: OA, DM, HLD, HTN, MI s/p stent  are also affecting patient's functional outcome.   REHAB POTENTIAL: Fair elevated pain, limited ROM and tolerance to activity  CLINICAL DECISION MAKING: Evolving/moderate complexity  EVALUATION COMPLEXITY: Moderate   GOALS: Goals reviewed with patient? Yes  SHORT TERM GOALS: Target date: 04/12/2023  Independent with initial HEP Goal status  Met  2.  Lt knee AROM 5-60 deg for improved function Goal status: mostly met  LONG TERM GOALS: Target date: 06/10/2023  Independent with final HEP Goal status: revised 04/15/2023  2.  FOTO score improved to 49 Goal status: revised 04/15/2023  3.  Lt knee AROM improved to 0-100 for improved mobility for daily activity  Goal status: revised 04/15/2023  4.  Report pain < 3/10 with standing and walking for improved function Goal status: revised 04/15/2023  5.  Demonstrate ability to amb with LRAD modified independent for improved mobility Goal status: revised 04/15/2023  6.  Patient will demonstrate/report ability to ascend/descend stairs with single hand rail and reciprocal gait pattern.  Goal status: revised  04/15/2023  7.  Patient will demonstrate Lt knee MMT 5/5 throughout to facilitate stairs, transfers and ambulation at PLOF.    PLAN:  PT FREQUENCY: every business day for 1 week post manip, then 2-3 x per week   PT DURATION: 8 weeks  PLANNED INTERVENTIONS: Therapeutic exercises, Therapeutic activity, Neuromuscular re-education, Balance training, Gait training, Patient/Family education, Self Care, Joint mobilization, Stair training, DME instructions, Aquatic Therapy, Dry Needling, Electrical stimulation, Cryotherapy, Moist heat, Taping, Vasopneumatic device, Manual therapy, and Re-evaluation  PLAN FOR NEXT SESSION: functional strength/balance within available range. continue Quad activation, mobility gains. Manual therapy & therapeutic exercise.   [name], PT, DPT 04/29/23 1:42 PM

## 2023-04-30 ENCOUNTER — Ambulatory Visit (INDEPENDENT_AMBULATORY_CARE_PROVIDER_SITE_OTHER): Payer: BC Managed Care – PPO | Admitting: Physical Therapy

## 2023-04-30 ENCOUNTER — Encounter: Payer: Self-pay | Admitting: Physical Therapy

## 2023-04-30 DIAGNOSIS — R2689 Other abnormalities of gait and mobility: Secondary | ICD-10-CM

## 2023-04-30 DIAGNOSIS — R2681 Unsteadiness on feet: Secondary | ICD-10-CM

## 2023-04-30 DIAGNOSIS — M6281 Muscle weakness (generalized): Secondary | ICD-10-CM

## 2023-04-30 DIAGNOSIS — M25662 Stiffness of left knee, not elsewhere classified: Secondary | ICD-10-CM

## 2023-04-30 DIAGNOSIS — M25562 Pain in left knee: Secondary | ICD-10-CM | POA: Diagnosis not present

## 2023-04-30 DIAGNOSIS — R600 Localized edema: Secondary | ICD-10-CM

## 2023-04-30 NOTE — Therapy (Signed)
OUTPATIENT PHYSICAL THERAPY TREATMENT   Patient Name: [name] MRN: [id number] DOB:1961/04/01, 62 y.o., female Today's Date: 04/30/2023    END OF SESSION:  PT End of Session - 04/30/23 0805     Visit Number 23    Number of Visits 28    Date for PT Re-Evaluation 06/10/23    Authorization Type [employer] State $72 copay    PT Start Time 0800    PT Stop Time 0850    PT Time Calculation (min) 50 min    Activity Tolerance Patient limited by pain    Behavior During Therapy [facility] for tasks assessed/performed                Past Medical History:  Diagnosis Date   Arthritis    Campylobacter enteritis May 2012   [facility] admission   Carpal tunnel syndrome, bilateral    Diabetes mellitus    Gastritis with bleeding due to alcohol 2010   [facility] admission   GERD (gastroesophageal reflux disease)    Hyperlipidemia    Hypertension    no meds   Menopause    Myocardial infarction (HCC)    Neck pain    Wears glasses    Past Surgical History:  Procedure Laterality Date   CARPAL TUNNEL RELEASE Left 05/14/2014   Procedure: LEFT ULNAR NEUROPLASTY AT ELBOW AND ENDOSCOPIC CARPAL TUNNEL RELEASE;  Surgeon: Jodi Marble, MD;  Location: [facility];  Service: Orthopedics;  Laterality: Left;   CORONARY STENT INTERVENTION N/A 10/09/2019   Procedure: CORONARY STENT INTERVENTION;  Surgeon: Yates Decamp, MD;  Location: [facility];  Service: Cardiovascular;  Laterality: N/A;   CYST EXCISION  1995   under arms   endometrial biopsy  2004   benign   FOOT OSTEOTOMY  2010   right-spurs   LEFT HEART CATH AND CORONARY ANGIOGRAPHY N/A 10/09/2019   Procedure: LEFT HEART CATH AND CORONARY ANGIOGRAPHY;  Surgeon: Yates Decamp, MD;  Location: [facility];  Service: Cardiovascular;  Laterality: N/A;   TONSILLECTOMY     TOTAL KNEE ARTHROPLASTY Left 02/26/2023   Procedure: LEFT TOTAL KNEE ARTHROPLASTY;  Surgeon: Kathryne Hitch, MD;  Location: [facility];  Service: Orthopedics;   Laterality: Left;  Needs RNFA   ULNAR NERVE TRANSPOSITION Left 05/14/2014   Procedure: ULNAR NERVE DECOMPRESSION/TRANSPOSITION;  Surgeon: Jodi Marble, MD;  Location: [facility];  Service: Orthopedics;  Laterality: Left;   Patient Active Problem List   Diagnosis Date Noted   Arthrofibrosis of total knee arthroplasty, subsequent encounter 04/07/2023   Status post left knee replacement 02/26/2023   Unilateral primary osteoarthritis, left knee 09/09/2022   Intractable nausea and vomiting 07/04/2022   Hematemesis 07/04/2022   Hypokalemia 07/04/2022   Lactic acidosis 07/04/2022   Pain due to onychomycosis of toenails of both feet 04/28/2022   Diabetic neuropathy, type II diabetes mellitus (HCC) 04/28/2022   De Quervain's tenosynovitis, left 11/13/2021   Bilateral injections given May 21, 2020 degenerative arthritis of knee, bilateral 05/21/2020   Traumatic hematoma of female breast 11/29/2019   Dizziness 11/29/2019   Hospital discharge follow-up 10/23/2019   NSTEMI (non-ST elevated myocardial infarction) (HCC) 10/10/2019   CAD (coronary artery disease) 10/10/2019   Chest pain 10/09/2019   Elevated troponin 10/09/2019   Uncontrolled type 2 diabetes mellitus with hyperglycemia (HCC) 10/09/2019   Obesity (BMI 30.0-34.9) 10/09/2019   Cervical spondylitis with radiculitis (HCC) 08/29/2019   Acute bursitis of left shoulder 01/30/2019   Chronic midline posterior neck  pain 06/28/2018   Urgency incontinence 06/28/2018   Degenerative joint disease of knee, left 04/26/2018   Carpal tunnel syndrome on both sides 03/29/2018   Anemia 01/02/2018   Class 1 obesity with serious comorbidity and body mass index (BMI) of 32.0 to 32.9 in adult 07/23/2017   Left hip pain 05/04/2017   Carpal tunnel syndrome on right 12/27/2016   Polyarthralgia 11/09/2016   Uncontrolled type 2 diabetes mellitus with retinopathy, with long-term current use of insulin 07/07/2016   Visit for preventive  health examination 06/21/2016   Increased urinary frequency 06/21/2016   Chronic meniscal tear of knee 08/19/2015   Left knee pain 06/19/2015   Candidiasis of skin 06/18/2015   Genital herpes 07/31/2014   Ulnar nerve compression 09/21/2013   Noncompliance with diet and medication regimen 11/15/2012   Boil, axilla 08/14/2012   Arthritis    Screening for breast cancer 08/04/2011   Screening for colon cancer 08/04/2011   HIDRADENITIS SUPPURATIVA 04/11/2007   GERD 11/17/2006   ENDOMETRIAL POLYP 11/17/2006   LOW BACK PAIN 11/17/2006   FIBROIDS, UTERUS 11/16/2006   Hyperlipidemia associated with type 2 diabetes mellitus (HCC) 11/16/2006   Essential hypertension 11/16/2006    PCP: Georganna Skeans, MD  REFERRING PROVIDER: Kathryne Hitch, MD  REFERRING DIAG: [phone] (ICD-10-CM) - Status post left knee replacement   THERAPY DIAG:  Acute pain of left knee  Stiffness of left knee, not elsewhere classified  Muscle weakness (generalized)  Other abnormalities of gait and mobility  Unsteadiness on feet  Localized edema  Rationale for Evaluation and Treatment: Rehabilitation  ONSET DATE: 02/26/23  SUBJECTIVE:   SUBJECTIVE STATEMENT:   Doing well; MD said she needs to be aggressive with motion   PERTINENT HISTORY: OA, DM, HLD, HTN, MI s/p stent  PAIN:  NPRS scale: 6-7/10 Pain location: Lt knee Pain description: throbbing Aggravating factors: constant, pressure, end range movements Relieving factors: repositioning, medications  PRECAUTIONS: Fall  WEIGHT BEARING RESTRICTIONS: No  FALLS:  Has patient fallen in last 6 months? Yes. 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[employer]; currently out on leave  PLOF:  Independent and Leisure: fishing  PATIENT GOALS: improve mobility and pain  NEXT MD VISIT: Not scheduled  OBJECTIVE:   PATIENT SURVEYS:  04/05/23: FOTO 38 03/15/23: FOTO 24 (predicted 49)   COGNITION: Overall cognitive status: Within functional limits for tasks assessed     SENSATION: WFL  EDEMA:  03/15/23: Circumferential: Knee Joint Line: Rt: 34.5 cm/Lt: 39 cm   PALPATION: 03/15/23: very tender to light touch  LOWER EXTREMITY ROM:  ROM Left 03/15/23 Left 03/22/23 Left 03/24/23 Left 03/31/23 Left 04/05/23 Left 04/07/2023 After manual Left 04/09/23 [place] 04/15/2023 Left 04/16/23 Left 04/27/23 Left 04/28/20  Knee flexion P: 42 A: 40 P: 69*  End of session P:  78* After manual supine AA: 82* A: 63 P: 70 Supine passive heel slide  88  A: 73 P: 82 67 AROM  In supine heel slide - limited by pain A: 81 (supine heel slide) A: 80 Supine, increased pain  A: 70 P: 80  Knee extension P: -6 A: Unable  supine P: -5*  Supine Quad set -3* A: Seated LAQ: -15   -25 AROM in seated LAQ   -4 PROM in heel prop TKE  supine  A: -10 P: -4 supine P: 0 A: -18   (Blank rows = not tested)  LOWER EXTREMITY MMT:    03/15/23: Not formally tested but grossly 2/5 Lt knee  MMT Left 03/29/2023 Left 04/15/2023  Knee flexion 4/5 NT  Knee extension 3+/5 in available range NT   (Blank rows = not tested)  FUNCTIONAL TESTS:  03/15/23:  increased difficulty rising from a chair; decreased gait velocity  GAIT: 04/15/2023:  Ambulation c FWW similar to initial arrival techniques.   04/02/2023:  SPC trials in clinic c SBA and cues for use and sequencing.  Antalgic gait noted but not that deviated from gait style c FWW.   03/15/23 Distance walked: 100' Assistive device utilized: Environmental consultant - 2 wheeled Level of assistance: SBA Comments: antalgic gait, decreased speed   TODAY'S TREATMENT: 04/30/23 TherEx NuStep L6 x 10 min working on pushing into flexion Leg Press bil 56# 3x10; 60 sec flexion hold  between sets Forward step ups onto 6" step 2x10 with LLE; 1 UE support Squats on ramp 2x10 Seated LAQ 2x10  Modalities Vasopneumaitc: 34 deg, medium pressure, x 10 minutes  04/29/23 TherEx NuStep L6 x 10 min working on pushing into flexion Lt foot on 8" step with flexion holds 10 x 15 sec hold Forward step ups onto 8" step x15 reps; up with Lt down with Rt for Lt knee flexion AA heel slides x 10 reps ROM measurements -see above for details  04/28/23 TherEx Recumbent bike: rocking back and forth, partial revolutions seat at 2 x 10 minutes Leg Press bil 56# 3x10; 60 sec flexion hold between sets  Manual Seated Lt knee flexion overpressure to tolerance with percussive device on quad x 5 min  Modalities Vasopneumaitc: 34 deg, medium pressure, x 10 minutes  04/27/23 TherEx Scifit bike: rocking back and forth, partial revolutions seat at 10 x 10 minutes Seated Lt LAQ  x 25 while performing opposite motion with contralateral LE  Supine SAQ: x 20 holding 3-5 seconds Supine heel slides x 20 using stretch strap Manual Seated Lt knee flexion overpressure to tolerance, supine knee  Modalities: Vasopneumaitc: 34 deg, medium pressure, x 10 minutes   04/23/23 TherEx NuStep L6 x 10 min; seat 7 for stretch into flexion Seated Lt LAQ 2x15 with percussive device Seated AA knee flexion 10 x 10 sec hold; RLE providing assist  Manual Seated Lt knee flexion overpressure to tolerance  HOME EXERCISE PROGRAM: Access Code: [email or web address] URL: [email or web address] Date: 03/29/2023 Prepared by: Chyrel Masson  Exercises - Quad Set (Mirrored)  - 5-10 x daily - 7 x weekly - 1 sets - 5-10 reps - 5 sec hold - Supine Heel Slide with Strap  - 5-10 x daily - 7 x weekly - 1 sets - 5-10 reps - Seated Heel Slide (Mirrored)  - 5-10 x daily - 7 x weekly - 1 sets - 5-10 reps - Seated Quad Set (Mirrored)  - 5-10 x daily - 7 x weekly - 1 sets - 5-10 reps - 5 sec hold - Seated Long Arc Quad  (Mirrored)  - 3-5 x daily - 7 x weekly - 1-2 sets - 10 reps - 2 hold - Seated Knee Flexion Extension AAROM with Overpressure (Mirrored)  - 5-6 x daily - 7 x weekly - 1 sets - 5 reps - 10 hold - Seated Straight Leg Heel Taps  - 1-2 x daily - 7 x weekly - 3 sets - 5-10 reps  ASSESSMENT: CLINICAL IMPRESSION: Working on functional  strength within available range with focus on flexion as well.  Will continue to benefit from PT to maximize function.  OBJECTIVE IMPAIRMENTS: Abnormal gait, decreased balance, decreased endurance, decreased mobility, difficulty walking, decreased ROM, decreased strength, hypomobility, increased edema, increased fascial restrictions, increased muscle spasms, and pain.   ACTIVITY LIMITATIONS: carrying, lifting, bending, sitting, standing, squatting, sleeping, stairs, transfers, bed mobility, toileting, dressing, and locomotion level  PARTICIPATION LIMITATIONS: meal prep, cleaning, laundry, driving, shopping, community activity, and occupation  PERSONAL FACTORS: 3+ comorbidities: OA, DM, HLD, HTN, MI s/p stent  are also affecting patient's functional outcome.   REHAB POTENTIAL: Fair elevated pain, limited ROM and tolerance to activity  CLINICAL DECISION MAKING: Evolving/moderate complexity  EVALUATION COMPLEXITY: Moderate   GOALS: Goals reviewed with patient? Yes  SHORT TERM GOALS: Target date: 04/12/2023  Independent with initial HEP Goal status  Met  2.  Lt knee AROM 5-60 deg for improved function Goal status: mostly met  LONG TERM GOALS: Target date: 06/10/2023  Independent with final HEP Goal status: revised 04/15/2023  2.  FOTO score improved to 49 Goal status: revised 04/15/2023  3.  Lt knee AROM improved to 0-100 for improved mobility for daily activity  Goal status: revised 04/15/2023  4.  Report pain < 3/10 with standing and walking for improved function Goal status: revised 04/15/2023  5.  Demonstrate ability to amb with LRAD modified  independent for improved mobility Goal status: revised 04/15/2023  6.  Patient will demonstrate/report ability to ascend/descend stairs with single hand rail and reciprocal gait pattern.  Goal status: revised 04/15/2023  7.  Patient will demonstrate Lt knee MMT 5/5 throughout to facilitate stairs, transfers and ambulation at PLOF.    PLAN:  PT FREQUENCY: every business day for 1 week post manip, then 2-3 x per week   PT DURATION: 8 weeks  PLANNED INTERVENTIONS: Therapeutic exercises, Therapeutic activity, Neuromuscular re-education, Balance training, Gait training, Patient/Family education, Self Care, Joint mobilization, Stair training, DME instructions, Aquatic Therapy, Dry Needling, Electrical stimulation, Cryotherapy, Moist heat, Taping, Vasopneumatic device, Manual therapy, and Re-evaluation  PLAN FOR NEXT SESSION: continue functional strength/balance within available range. continue Quad activation, mobility gains. Manual therapy & therapeutic exercise.   Clarita Crane, PT, DPT 04/30/23 8:43 AM

## 2023-05-05 ENCOUNTER — Encounter: Payer: Self-pay | Admitting: Rehabilitative and Restorative Service Providers"

## 2023-05-05 ENCOUNTER — Ambulatory Visit (INDEPENDENT_AMBULATORY_CARE_PROVIDER_SITE_OTHER): Payer: BC Managed Care – PPO | Admitting: Rehabilitative and Restorative Service Providers"

## 2023-05-05 DIAGNOSIS — M6281 Muscle weakness (generalized): Secondary | ICD-10-CM

## 2023-05-05 DIAGNOSIS — R2689 Other abnormalities of gait and mobility: Secondary | ICD-10-CM

## 2023-05-05 DIAGNOSIS — M25562 Pain in left knee: Secondary | ICD-10-CM

## 2023-05-05 DIAGNOSIS — M25662 Stiffness of left knee, not elsewhere classified: Secondary | ICD-10-CM

## 2023-05-05 DIAGNOSIS — R2681 Unsteadiness on feet: Secondary | ICD-10-CM

## 2023-05-05 DIAGNOSIS — R600 Localized edema: Secondary | ICD-10-CM

## 2023-05-05 NOTE — Therapy (Addendum)
OUTPATIENT PHYSICAL THERAPY TREATMENT   Patient Name: [name] MRN: [id number] DOB:1961/06/17, 62 y.o., female Today's Date: 05/07/2023    END OF SESSION:    05/05/23 0928  PT Visits / Re-Eval  Visit Number 24  Number of Visits 28  Date for PT Re-Evaluation 06/10/23  Authorization  Authorization Type [employer] $72 copay  PT Time Calculation  PT Start Time 0929  PT Stop Time 1019  PT Time Calculation (min) 50 min  PT - End of Session  Activity Tolerance Patient limited by pain  Behavior During Therapy [facility] for tasks assessed/performed         Past Medical History:  Diagnosis Date   Arthritis    Campylobacter enteritis May 2012   [facility] admission   Carpal tunnel syndrome, bilateral    Diabetes mellitus    Gastritis with bleeding due to alcohol 2010   [facility] admission   GERD (gastroesophageal reflux disease)    Hyperlipidemia    Hypertension    no meds   Menopause    Myocardial infarction (HCC)    Neck pain    Wears glasses    Past Surgical History:  Procedure Laterality Date   CARPAL TUNNEL RELEASE Left 05/14/2014   Procedure: LEFT ULNAR NEUROPLASTY AT ELBOW AND ENDOSCOPIC CARPAL TUNNEL RELEASE;  Surgeon: Jodi Marble, MD;  Location: [facility];  Service: Orthopedics;  Laterality: Left;   CORONARY STENT INTERVENTION N/A 10/09/2019   Procedure: CORONARY STENT INTERVENTION;  Surgeon: Yates Decamp, MD;  Location: [facility];  Service: Cardiovascular;  Laterality: N/A;   CYST EXCISION  1995   under arms   endometrial biopsy  2004   benign   FOOT OSTEOTOMY  2010   right-spurs   LEFT HEART CATH AND CORONARY ANGIOGRAPHY N/A 10/09/2019   Procedure: LEFT HEART CATH AND CORONARY ANGIOGRAPHY;  Surgeon: Yates Decamp, MD;  Location: [facility];  Service: Cardiovascular;  Laterality: N/A;   TONSILLECTOMY     TOTAL KNEE ARTHROPLASTY Left 02/26/2023   Procedure: LEFT TOTAL KNEE ARTHROPLASTY;  Surgeon: Kathryne Hitch, MD;  Location:  [facility];  Service: Orthopedics;  Laterality: Left;  Needs RNFA   ULNAR NERVE TRANSPOSITION Left 05/14/2014   Procedure: ULNAR NERVE DECOMPRESSION/TRANSPOSITION;  Surgeon: Jodi Marble, MD;  Location: [facility];  Service: Orthopedics;  Laterality: Left;   Patient Active Problem List   Diagnosis Date Noted   Arthrofibrosis of total knee arthroplasty, subsequent encounter 04/07/2023   Status post left knee replacement 02/26/2023   Unilateral primary osteoarthritis, left knee 09/09/2022   Intractable nausea and vomiting 07/04/2022   Hematemesis 07/04/2022   Hypokalemia 07/04/2022   Lactic acidosis 07/04/2022   Pain due to onychomycosis of toenails of both feet 04/28/2022   Diabetic neuropathy, type II diabetes mellitus (HCC) 04/28/2022   De Quervain's tenosynovitis, left 11/13/2021   Bilateral injections given May 21, 2020 degenerative arthritis of knee, bilateral 05/21/2020   Traumatic hematoma of female breast 11/29/2019   Dizziness 11/29/2019   Hospital discharge follow-up 10/23/2019   NSTEMI (non-ST elevated myocardial infarction) (HCC) 10/10/2019   CAD (coronary artery disease) 10/10/2019   Chest pain 10/09/2019   Elevated troponin 10/09/2019   Uncontrolled type 2 diabetes mellitus with hyperglycemia (HCC) 10/09/2019   Obesity (BMI 30.0-34.9) 10/09/2019   Cervical spondylitis with radiculitis (HCC) 08/29/2019   Acute bursitis of left shoulder 01/30/2019   Chronic midline posterior neck pain 06/28/2018   Urgency incontinence 06/28/2018   Degenerative joint disease  of knee, left 04/26/2018   Carpal tunnel syndrome on both sides 03/29/2018   Anemia 01/02/2018   Class 1 obesity with serious comorbidity and body mass index (BMI) of 32.0 to 32.9 in adult 07/23/2017   Left hip pain 05/04/2017   Carpal tunnel syndrome on right 12/27/2016   Polyarthralgia 11/09/2016   Uncontrolled type 2 diabetes mellitus with retinopathy, with long-term current use of insulin  07/07/2016   Visit for preventive health examination 06/21/2016   Increased urinary frequency 06/21/2016   Chronic meniscal tear of knee 08/19/2015   Left knee pain 06/19/2015   Candidiasis of skin 06/18/2015   Genital herpes 07/31/2014   Ulnar nerve compression 09/21/2013   Noncompliance with diet and medication regimen 11/15/2012   Boil, axilla 08/14/2012   Arthritis    Screening for breast cancer 08/04/2011   Screening for colon cancer 08/04/2011   HIDRADENITIS SUPPURATIVA 04/11/2007   GERD 11/17/2006   ENDOMETRIAL POLYP 11/17/2006   LOW BACK PAIN 11/17/2006   FIBROIDS, UTERUS 11/16/2006   Hyperlipidemia associated with type 2 diabetes mellitus (HCC) 11/16/2006   Essential hypertension 11/16/2006    PCP: Georganna Skeans, MD  REFERRING PROVIDER: Kathryne Hitch, MD  REFERRING DIAG: [phone] (ICD-10-CM) - Status post left knee replacement   THERAPY DIAG:  Acute pain of left knee  Stiffness of left knee, not elsewhere classified  Muscle weakness (generalized)  Other abnormalities of gait and mobility  Unsteadiness on feet  Localized edema  Rationale for Evaluation and Treatment: Rehabilitation  ONSET DATE: 02/26/23  SUBJECTIVE:   SUBJECTIVE STATEMENT:   She indicated feeling like she can bend better at times.  She reported having pain in thigh/hip with walking.    PERTINENT HISTORY: OA, DM, HLD, HTN, MI s/p stent  PAIN:  NPRS scale: 7/10 Pain location: Lt knee Pain description: throbbing Aggravating factors: constant, pressure, end range movements Relieving factors: repositioning, medications  PRECAUTIONS: Fall  WEIGHT BEARING RESTRICTIONS: No  FALLS:  Has patient fallen in last 6 months? Yes. 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[employer]; currently out on leave  PLOF: Independent and Leisure: fishing  PATIENT GOALS: improve mobility and pain  NEXT MD VISIT: Not scheduled  OBJECTIVE:   PATIENT SURVEYS:  04/05/23: FOTO 38 03/15/23: FOTO 24 (predicted 49)   COGNITION: Overall cognitive status: Within functional limits for tasks assessed     SENSATION: WFL  EDEMA:  03/15/23: Circumferential: Knee Joint Line: Rt: 34.5 cm/Lt: 39 cm   PALPATION: 03/15/23: very tender to light touch  LOWER EXTREMITY ROM:  ROM Left 03/15/23 Left 03/22/23 Left 03/24/23 Left 03/31/23 Left 04/05/23 Left 04/07/2023 After manual Left 04/09/23 [place] 04/15/2023 Left 04/16/23 Left 04/27/23 Left 04/28/20  Knee flexion P: 42 A: 40 P: 69*  End of session P:  78* After manual supine AA: 82* A: 63 P: 70 Supine passive heel slide  88  A: 73 P: 82 67 AROM  In supine heel slide - limited by pain A: 81 (supine heel slide) A: 80 Supine, increased pain  A: 70 P: 80  Knee extension P: -6 A: Unable  supine P: -5*  Supine Quad set -3* A: Seated LAQ: -15   -25 AROM in seated LAQ   -4 PROM in heel prop TKE supine  A: -  10 P: -4 supine P: 0 A: -18   (Blank rows = not tested)  LOWER EXTREMITY MMT:    03/15/23: Not formally tested but grossly 2/5 Lt knee  MMT Left 03/29/2023 Left 04/15/2023   Knee flexion 4/5 NT   Knee extension 3+/5 in available range NT    (Blank rows = not tested)  FUNCTIONAL TESTS:  03/15/23:  increased difficulty rising from a chair; decreased gait velocity  GAIT: 04/15/2023:  Ambulation c FWW similar to initial arrival techniques.   04/02/2023:  SPC trials in clinic c SBA and cues for use and sequencing.  Antalgic gait noted but not that deviated from gait style c FWW.   03/15/23 Distance walked: 100' Assistive device utilized: Environmental consultant - 2 wheeled Level of assistance: SBA Comments: antalgic gait, decreased speed   TODAY'S TREATMENT:                                                                     DATE :  05/05/2023 TherEx NuStep L6 x 10 min working on pushing into flexion in various seat positions.  Leg Press bilateral 56 lbs x 15 , Lt leg only 2 x 10 31 lbs  c 30 sec rest into flexion Supine heel slide with strap 10 sec hold with quad set 5 sec hold combo x 10 Lt leg  Manual Supine Lt knee flexion prolonged stretching, contract/relax for flexion gains.   Neuro Re-ed Tandem stance 1 min x 1 bilateral c occasional HHA for loss of balance.   Modalities Vasopneumaitc: 34 deg, medium pressure, x 10 minutes  TODAY'S TREATMENT:                                                                    DATE : 04/30/23 TherEx NuStep L6 x 10 min working on pushing into flexion Leg Press bil 56# 3x10; 60 sec flexion hold between sets Forward step ups onto 6" step 2x10 with LLE; 1 UE support Squats on ramp 2x10 Seated LAQ 2x10  Modalities Vasopneumaitc: 34 deg, medium pressure, x 10 minutes  TODAY'S TREATMENT:                                                                    DATE : 04/29/23 TherEx NuStep L6 x 10 min working on pushing into flexion Lt foot on 8" step with flexion holds 10 x 15 sec hold Forward step ups onto 8" step x15 reps; up with Lt down with Rt for Lt knee flexion AA heel slides x 10 reps ROM measurements -see above for details  TODAY'S TREATMENT:  DATE : 04/28/23 TherEx Recumbent bike: rocking back and forth, partial revolutions seat at 2 x 10 minutes Leg Press bil 56# 3x10; 60 sec flexion hold between sets  Manual Seated Lt knee flexion overpressure to tolerance with percussive device on quad x 5 min  Modalities Vasopneumaitc: 34 deg, medium pressure, x 10 minutes  TODAY'S TREATMENT:                                                                    DATE : 04/27/23 TherEx Scifit bike: rocking back and forth, partial revolutions seat at 10 x 10 minutes Seated Lt LAQ   x 25 while performing opposite motion with contralateral LE  Supine SAQ: x 20 holding 3-5 seconds Supine heel slides x 20 using stretch strap Manual Seated Lt knee flexion overpressure to tolerance, supine knee  Modalities: Vasopneumaitc: 34 deg, medium pressure, x 10 minutes   HOME EXERCISE PROGRAM: Access Code: [email or web address] URL: [email or web address] Date: 03/29/2023 Prepared by: Chyrel Masson  Exercises - Quad Set (Mirrored)  - 5-10 x daily - 7 x weekly - 1 sets - 5-10 reps - 5 sec hold - Supine Heel Slide with Strap  - 5-10 x daily - 7 x weekly - 1 sets - 5-10 reps - Seated Heel Slide (Mirrored)  - 5-10 x daily - 7 x weekly - 1 sets - 5-10 reps - Seated Quad Set (Mirrored)  - 5-10 x daily - 7 x weekly - 1 sets - 5-10 reps - 5 sec hold - Seated Long Arc Quad (Mirrored)  - 3-5 x daily - 7 x weekly - 1-2 sets - 10 reps - 2 hold - Seated Knee Flexion Extension AAROM with Overpressure (Mirrored)  - 5-6 x daily - 7 x weekly - 1 sets - 5 reps - 10 hold - Seated Straight Leg Heel Taps  - 1-2 x daily - 7 x weekly - 3 sets - 5-10 reps  ASSESSMENT: CLINICAL IMPRESSION: Quality of available movement has improved but overall quantity of mobility still shows as limited and difficult with firm end rnages.   OBJECTIVE IMPAIRMENTS: Abnormal gait, decreased balance, decreased endurance, decreased mobility, difficulty walking, decreased ROM, decreased strength, hypomobility, increased edema, increased fascial restrictions, increased muscle spasms, and pain.   ACTIVITY LIMITATIONS: carrying, lifting, bending, sitting, standing, squatting, sleeping, stairs, transfers, bed mobility, toileting, dressing, and locomotion level  PARTICIPATION LIMITATIONS: meal prep, cleaning, laundry, driving, shopping, community activity, and occupation  PERSONAL FACTORS: 3+ comorbidities: OA, DM, HLD, HTN, MI s/p stent  are also affecting patient's functional outcome.   REHAB POTENTIAL: Fair elevated pain,  limited ROM and tolerance to activity  CLINICAL DECISION MAKING: Evolving/moderate complexity  EVALUATION COMPLEXITY: Moderate   GOALS: Goals reviewed with patient? Yes  SHORT TERM GOALS: Target date: 04/12/2023  Independent with initial HEP Goal status  Met  2.  Lt knee AROM 5-60 deg for improved function Goal status: mostly met  LONG TERM GOALS: Target date: 06/10/2023  Independent with final HEP Goal status: revised 04/15/2023  2.  FOTO score improved to 49 Goal status: revised 04/15/2023  3.  Lt knee AROM improved to 0-100 for improved mobility for daily activity  Goal status: revised 04/15/2023  4.  Report pain < 3/10 with standing and walking  for improved function Goal status: revised 04/15/2023  5.  Demonstrate ability to amb with LRAD modified independent for improved mobility Goal status: revised 04/15/2023  6.  Patient will demonstrate/report ability to ascend/descend stairs with single hand rail and reciprocal gait pattern.  Goal status: revised 04/15/2023  7.  Patient will demonstrate Lt knee MMT 5/5 throughout to facilitate stairs, transfers and ambulation at PLOF.    PLAN:  PT FREQUENCY: every business day for 1 week post manip, then 2-3 x per week   PT DURATION: 8 weeks  PLANNED INTERVENTIONS: Therapeutic exercises, Therapeutic activity, Neuromuscular re-education, Balance training, Gait training, Patient/Family education, Self Care, Joint mobilization, Stair training, DME instructions, Aquatic Therapy, Dry Needling, Electrical stimulation, Cryotherapy, Moist heat, Taping, Vasopneumatic device, Manual therapy, and Re-evaluation  PLAN FOR NEXT SESSION: Updates (ROM, MMT, FOTO).  With LTG reassessment.    Chyrel Masson, PT, DPT, OCS, ATC 05/07/23  7:49 AM

## 2023-05-07 ENCOUNTER — Ambulatory Visit (INDEPENDENT_AMBULATORY_CARE_PROVIDER_SITE_OTHER): Payer: BC Managed Care – PPO | Admitting: Physical Therapy

## 2023-05-07 ENCOUNTER — Encounter: Payer: Self-pay | Admitting: Physical Therapy

## 2023-05-07 DIAGNOSIS — M25662 Stiffness of left knee, not elsewhere classified: Secondary | ICD-10-CM | POA: Diagnosis not present

## 2023-05-07 DIAGNOSIS — R2681 Unsteadiness on feet: Secondary | ICD-10-CM

## 2023-05-07 DIAGNOSIS — R600 Localized edema: Secondary | ICD-10-CM

## 2023-05-07 DIAGNOSIS — R2689 Other abnormalities of gait and mobility: Secondary | ICD-10-CM | POA: Diagnosis not present

## 2023-05-07 DIAGNOSIS — M6281 Muscle weakness (generalized): Secondary | ICD-10-CM

## 2023-05-07 DIAGNOSIS — M25562 Pain in left knee: Secondary | ICD-10-CM

## 2023-05-07 NOTE — Therapy (Signed)
OUTPATIENT PHYSICAL THERAPY TREATMENT   Patient Name: [name] MRN: [id number] DOB:1961-11-19, 62 y.o., female Today's Date: 05/07/2023    END OF SESSION:  PT End of Session - 05/07/23 0802     Visit Number 25    Number of Visits 28    Date for PT Re-Evaluation 06/10/23    Authorization Type [employer] State $72 copay    PT Start Time 0759    PT Stop Time 0850    PT Time Calculation (min) 51 min    Activity Tolerance Patient limited by pain    Behavior During Therapy [facility] for tasks assessed/performed                  Past Medical History:  Diagnosis Date   Arthritis    Campylobacter enteritis May 2012   [facility] admission   Carpal tunnel syndrome, bilateral    Diabetes mellitus    Gastritis with bleeding due to alcohol 2010   [facility] admission   GERD (gastroesophageal reflux disease)    Hyperlipidemia    Hypertension    no meds   Menopause    Myocardial infarction (HCC)    Neck pain    Wears glasses    Past Surgical History:  Procedure Laterality Date   CARPAL TUNNEL RELEASE Left 05/14/2014   Procedure: LEFT ULNAR NEUROPLASTY AT ELBOW AND ENDOSCOPIC CARPAL TUNNEL RELEASE;  Surgeon: Jodi Marble, MD;  Location: [facility];  Service: Orthopedics;  Laterality: Left;   CORONARY STENT INTERVENTION N/A 10/09/2019   Procedure: CORONARY STENT INTERVENTION;  Surgeon: Yates Decamp, MD;  Location: [facility];  Service: Cardiovascular;  Laterality: N/A;   CYST EXCISION  1995   under arms   endometrial biopsy  2004   benign   FOOT OSTEOTOMY  2010   right-spurs   LEFT HEART CATH AND CORONARY ANGIOGRAPHY N/A 10/09/2019   Procedure: LEFT HEART CATH AND CORONARY ANGIOGRAPHY;  Surgeon: Yates Decamp, MD;  Location: [facility];  Service: Cardiovascular;  Laterality: N/A;   TONSILLECTOMY     TOTAL KNEE ARTHROPLASTY Left 02/26/2023   Procedure: LEFT TOTAL KNEE ARTHROPLASTY;  Surgeon: Kathryne Hitch, MD;  Location: [facility];  Service: Orthopedics;   Laterality: Left;  Needs RNFA   ULNAR NERVE TRANSPOSITION Left 05/14/2014   Procedure: ULNAR NERVE DECOMPRESSION/TRANSPOSITION;  Surgeon: Jodi Marble, MD;  Location: [facility];  Service: Orthopedics;  Laterality: Left;   Patient Active Problem List   Diagnosis Date Noted   Arthrofibrosis of total knee arthroplasty, subsequent encounter 04/07/2023   Status post left knee replacement 02/26/2023   Unilateral primary osteoarthritis, left knee 09/09/2022   Intractable nausea and vomiting 07/04/2022   Hematemesis 07/04/2022   Hypokalemia 07/04/2022   Lactic acidosis 07/04/2022   Pain due to onychomycosis of toenails of both feet 04/28/2022   Diabetic neuropathy, type II diabetes mellitus (HCC) 04/28/2022   De Quervain's tenosynovitis, left 11/13/2021   Bilateral injections given May 21, 2020 degenerative arthritis of knee, bilateral 05/21/2020   Traumatic hematoma of female breast 11/29/2019   Dizziness 11/29/2019   Hospital discharge follow-up 10/23/2019   NSTEMI (non-ST elevated myocardial infarction) (HCC) 10/10/2019   CAD (coronary artery disease) 10/10/2019   Chest pain 10/09/2019   Elevated troponin 10/09/2019   Uncontrolled type 2 diabetes mellitus with hyperglycemia (HCC) 10/09/2019   Obesity (BMI 30.0-34.9) 10/09/2019   Cervical spondylitis with radiculitis (HCC) 08/29/2019   Acute bursitis of left shoulder 01/30/2019   Chronic midline  posterior neck pain 06/28/2018   Urgency incontinence 06/28/2018   Degenerative joint disease of knee, left 04/26/2018   Carpal tunnel syndrome on both sides 03/29/2018   Anemia 01/02/2018   Class 1 obesity with serious comorbidity and body mass index (BMI) of 32.0 to 32.9 in adult 07/23/2017   Left hip pain 05/04/2017   Carpal tunnel syndrome on right 12/27/2016   Polyarthralgia 11/09/2016   Uncontrolled type 2 diabetes mellitus with retinopathy, with long-term current use of insulin 07/07/2016   Visit for preventive  health examination 06/21/2016   Increased urinary frequency 06/21/2016   Chronic meniscal tear of knee 08/19/2015   Left knee pain 06/19/2015   Candidiasis of skin 06/18/2015   Genital herpes 07/31/2014   Ulnar nerve compression 09/21/2013   Noncompliance with diet and medication regimen 11/15/2012   Boil, axilla 08/14/2012   Arthritis    Screening for breast cancer 08/04/2011   Screening for colon cancer 08/04/2011   HIDRADENITIS SUPPURATIVA 04/11/2007   GERD 11/17/2006   ENDOMETRIAL POLYP 11/17/2006   LOW BACK PAIN 11/17/2006   FIBROIDS, UTERUS 11/16/2006   Hyperlipidemia associated with type 2 diabetes mellitus (HCC) 11/16/2006   Essential hypertension 11/16/2006    PCP: Georganna Skeans, MD  REFERRING PROVIDER: Kathryne Hitch, MD  REFERRING DIAG: [phone] (ICD-10-CM) - Status post left knee replacement   THERAPY DIAG:  Acute pain of left knee  Stiffness of left knee, not elsewhere classified  Muscle weakness (generalized)  Other abnormalities of gait and mobility  Unsteadiness on feet  Localized edema  Rationale for Evaluation and Treatment: Rehabilitation  ONSET DATE: 02/26/23  SUBJECTIVE:   SUBJECTIVE STATEMENT:   Doing well; still having knee pain   PERTINENT HISTORY: OA, DM, HLD, HTN, MI s/p stent  PAIN:  NPRS scale: 7/10 Pain location: Lt knee Pain description: throbbing Aggravating factors: constant, pressure, end range movements Relieving factors: repositioning, medications  PRECAUTIONS: Fall  WEIGHT BEARING RESTRICTIONS: No  FALLS:  Has patient fallen in last 6 months? Yes. 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[employer]; currently out on leave  PLOF: Independent and Leisure:  fishing  PATIENT GOALS: improve mobility and pain  NEXT MD VISIT: Not scheduled  OBJECTIVE:   PATIENT SURVEYS:  04/05/23: FOTO 38 03/15/23: FOTO 24 (predicted 49)   COGNITION: Overall cognitive status: Within functional limits for tasks assessed     SENSATION: WFL  EDEMA:  03/15/23: Circumferential: Knee Joint Line: Rt: 34.5 cm/Lt: 39 cm   PALPATION: 03/15/23: very tender to light touch  LOWER EXTREMITY ROM:  ROM Left 03/15/23 Left 03/22/23 Left 03/24/23 Left 03/31/23 Left 04/05/23 Left 04/07/2023 After manual Left 04/09/23 [place] 04/15/2023 Left 04/16/23 Left 04/27/23 Left 04/28/20  Knee flexion P: 42 A: 40 P: 69*  End of session P:  78* After manual supine AA: 82* A: 63 P: 70 Supine passive heel slide  88  A: 73 P: 82 67 AROM  In supine heel slide - limited by pain A: 81 (supine heel slide) A: 80 Supine, increased pain  A: 70 P: 80  Knee extension P: -6 A: Unable  supine P: -5*  Supine Quad set -3* A: Seated LAQ: -15   -25 AROM in seated LAQ   -4 PROM in heel prop TKE supine  A: -  10 P: -4 supine P: 0 A: -18   (Blank rows = not tested)  LOWER EXTREMITY MMT:    03/15/23: Not formally tested but grossly 2/5 Lt knee  MMT Left 03/29/2023 Left 04/15/2023   Knee flexion 4/5 NT   Knee extension 3+/5 in available range NT    (Blank rows = not tested)  FUNCTIONAL TESTS:  03/15/23:  increased difficulty rising from a chair; decreased gait velocity  GAIT: 04/15/2023:  Ambulation c FWW similar to initial arrival techniques.   04/02/2023:  SPC trials in clinic c SBA and cues for use and sequencing.  Antalgic gait noted but not that deviated from gait style c FWW.   03/15/23 Distance walked: 100' Assistive device utilized: Environmental consultant - 2 wheeled Level of assistance: SBA Comments: antalgic gait, decreased speed   TODAY'S TREATMENT: 05/07/23 TherEx NuStep L6 x 10 min working on pushing into flexion in various seat positions Seated Lt LAQ with 3# 3x10; 3 sec hold within  active range Sit to/from stand x 10 reps without UE support Leg Press bil 56# 3x10; LLE 31# 2x10  Neuro Re-ed Side stepping in // bars on balance beam x 5 laps  Manual Seated knee flexion PROM with contract/relax   Modalities Vasopneumaitc: 34 deg, medium pressure, x 10 minutes   05/05/2023 TherEx NuStep L6 x 10 min working on pushing into flexion in various seat positions.  Leg Press bilateral 56 lbs x 15 , Lt leg only 2 x 10 31 lbs  c 30 sec rest into flexion Supine heel slide with strap 10 sec hold with quad set 5 sec hold combo x 10 Lt leg  Manual Supine Lt knee flexion prolonged stretching, contract/relax for flexion gains.   Neuro Re-ed Tandem stance 1 min x 1 bilateral c occasional HHA for loss of balance.   Modalities Vasopneumaitc: 34 deg, medium pressure, x 10 minutes  04/30/23 TherEx NuStep L6 x 10 min working on pushing into flexion Leg Press bil 56# 3x10; 60 sec flexion hold between sets Forward step ups onto 6" step 2x10 with LLE; 1 UE support Squats on ramp 2x10 Seated LAQ 2x10  Modalities Vasopneumaitc: 34 deg, medium pressure, x 10 minutes  04/29/23 TherEx NuStep L6 x 10 min working on pushing into flexion Lt foot on 8" step with flexion holds 10 x 15 sec hold Forward step ups onto 8" step x15 reps; up with Lt down with Rt for Lt knee flexion AA heel slides x 10 reps ROM measurements -see above for details   HOME EXERCISE PROGRAM: Access Code: [email or web address] URL: [email or web address] Date: 03/29/2023 Prepared by: Chyrel Masson  Exercises - Quad Set (Mirrored)  - 5-10 x daily - 7 x weekly - 1 sets - 5-10 reps - 5 sec hold - Supine Heel Slide with Strap  - 5-10 x daily - 7 x weekly - 1 sets - 5-10 reps - Seated Heel Slide (Mirrored)  - 5-10 x daily - 7 x weekly - 1 sets - 5-10 reps - Seated Quad Set (Mirrored)  - 5-10 x daily - 7 x weekly - 1 sets - 5-10 reps - 5 sec hold - Seated Long Arc Quad (Mirrored)  - 3-5 x daily - 7 x weekly -  1-2 sets - 10 reps - 2 hold - Seated Knee Flexion Extension AAROM with Overpressure (Mirrored)  - 5-6 x daily - 7 x weekly - 1 sets - 5 reps - 10 hold - Seated Straight Leg Heel Taps  - 1-2 x  daily - 7 x weekly - 3 sets - 5-10 reps  ASSESSMENT: CLINICAL IMPRESSION: Pt tolerated session well today but continues to have difficulty with quad activation.  Tolerated manual therapy better today.  Will continue to benefit from PT to maximize function.  OBJECTIVE IMPAIRMENTS: Abnormal gait, decreased balance, decreased endurance, decreased mobility, difficulty walking, decreased ROM, decreased strength, hypomobility, increased edema, increased fascial restrictions, increased muscle spasms, and pain.   ACTIVITY LIMITATIONS: carrying, lifting, bending, sitting, standing, squatting, sleeping, stairs, transfers, bed mobility, toileting, dressing, and locomotion level  PARTICIPATION LIMITATIONS: meal prep, cleaning, laundry, driving, shopping, community activity, and occupation  PERSONAL FACTORS: 3+ comorbidities: OA, DM, HLD, HTN, MI s/p stent  are also affecting patient's functional outcome.   REHAB POTENTIAL: Fair elevated pain, limited ROM and tolerance to activity  CLINICAL DECISION MAKING: Evolving/moderate complexity  EVALUATION COMPLEXITY: Moderate   GOALS: Goals reviewed with patient? Yes  SHORT TERM GOALS: Target date: 04/12/2023  Independent with initial HEP Goal status  Met  2.  Lt knee AROM 5-60 deg for improved function Goal status: mostly met  LONG TERM GOALS: Target date: 06/10/2023  Independent with final HEP Goal status: revised 04/15/2023  2.  FOTO score improved to 49 Goal status: revised 04/15/2023  3.  Lt knee AROM improved to 0-100 for improved mobility for daily activity  Goal status: revised 04/15/2023  4.  Report pain < 3/10 with standing and walking for improved function Goal status: revised 04/15/2023  5.  Demonstrate ability to amb with LRAD modified  independent for improved mobility Goal status: revised 04/15/2023  6.  Patient will demonstrate/report ability to ascend/descend stairs with single hand rail and reciprocal gait pattern.  Goal status: revised 04/15/2023  7.  Patient will demonstrate Lt knee MMT 5/5 throughout to facilitate stairs, transfers and ambulation at PLOF.    PLAN:  PT FREQUENCY: every business day for 1 week post manip, then 2-3 x per week   PT DURATION: 8 weeks  PLANNED INTERVENTIONS: Therapeutic exercises, Therapeutic activity, Neuromuscular re-education, Balance training, Gait training, Patient/Family education, Self Care, Joint mobilization, Stair training, DME instructions, Aquatic Therapy, Dry Needling, Electrical stimulation, Cryotherapy, Moist heat, Taping, Vasopneumatic device, Manual therapy, and Re-evaluation  PLAN FOR NEXT SESSION: get foto, continue to maximize range and function within available range    Clarita Crane, PT, DPT 05/07/23 8:47 AM

## 2023-05-10 ENCOUNTER — Ambulatory Visit: Payer: BC Managed Care – PPO | Admitting: Orthopaedic Surgery

## 2023-05-11 ENCOUNTER — Ambulatory Visit (INDEPENDENT_AMBULATORY_CARE_PROVIDER_SITE_OTHER): Payer: BC Managed Care – PPO | Admitting: Physical Therapy

## 2023-05-11 ENCOUNTER — Encounter: Payer: Self-pay | Admitting: Physical Therapy

## 2023-05-11 DIAGNOSIS — R2681 Unsteadiness on feet: Secondary | ICD-10-CM

## 2023-05-11 DIAGNOSIS — M25662 Stiffness of left knee, not elsewhere classified: Secondary | ICD-10-CM | POA: Diagnosis not present

## 2023-05-11 DIAGNOSIS — M6281 Muscle weakness (generalized): Secondary | ICD-10-CM | POA: Diagnosis not present

## 2023-05-11 DIAGNOSIS — M25562 Pain in left knee: Secondary | ICD-10-CM | POA: Diagnosis not present

## 2023-05-11 DIAGNOSIS — R2689 Other abnormalities of gait and mobility: Secondary | ICD-10-CM | POA: Diagnosis not present

## 2023-05-11 DIAGNOSIS — R600 Localized edema: Secondary | ICD-10-CM

## 2023-05-11 NOTE — Therapy (Signed)
OUTPATIENT PHYSICAL THERAPY TREATMENT   Patient Name: [name] MRN: [id number] DOB:[birth year], 62 y.o., female Today's Date: 05/11/2023    END OF SESSION:  PT End of Session - 05/11/23 0800     Visit Number 26    Number of Visits 28    Date for PT Re-Evaluation 06/10/23    Authorization Type [employer] State $72 copay    PT Start Time 0800    PT Stop Time 0848    PT Time Calculation (min) 48 min    Activity Tolerance Patient limited by pain    Behavior During Therapy [facility] for tasks assessed/performed                  Past Medical History:  Diagnosis Date   Arthritis    Campylobacter enteritis May 2012   [facility] admission   Carpal tunnel syndrome, bilateral    Diabetes mellitus    Gastritis with bleeding due to alcohol 2010   [facility] admission   GERD (gastroesophageal reflux disease)    Hyperlipidemia    Hypertension    no meds   Menopause    Myocardial infarction (HCC)    Neck pain    Wears glasses    Past Surgical History:  Procedure Laterality Date   CARPAL TUNNEL RELEASE Left 05/14/2014   Procedure: LEFT ULNAR NEUROPLASTY AT ELBOW AND ENDOSCOPIC CARPAL TUNNEL RELEASE;  Surgeon: Jodi Marble, MD;  Location: [facility];  Service: Orthopedics;  Laterality: Left;   CORONARY STENT INTERVENTION N/A 10/09/2019   Procedure: CORONARY STENT INTERVENTION;  Surgeon: Yates Decamp, MD;  Location: [facility];  Service: Cardiovascular;  Laterality: N/A;   CYST EXCISION  1995   under arms   endometrial biopsy  2004   benign   FOOT OSTEOTOMY  2010   right-spurs   LEFT HEART CATH AND CORONARY ANGIOGRAPHY N/A 10/09/2019   Procedure: LEFT HEART CATH AND CORONARY ANGIOGRAPHY;  Surgeon: Yates Decamp, MD;  Location: [facility];  Service: Cardiovascular;  Laterality: N/A;   TONSILLECTOMY     TOTAL KNEE ARTHROPLASTY Left 02/26/2023   Procedure: LEFT TOTAL KNEE ARTHROPLASTY;  Surgeon: Kathryne Hitch, MD;  Location: [facility];  Service: Orthopedics;   Laterality: Left;  Needs RNFA   ULNAR NERVE TRANSPOSITION Left 05/14/2014   Procedure: ULNAR NERVE DECOMPRESSION/TRANSPOSITION;  Surgeon: Jodi Marble, MD;  Location: [facility];  Service: Orthopedics;  Laterality: Left;   Patient Active Problem List   Diagnosis Date Noted   Arthrofibrosis of total knee arthroplasty, subsequent encounter 04/07/2023   Status post left knee replacement 02/26/2023   Unilateral primary osteoarthritis, left knee 09/09/2022   Intractable nausea and vomiting 07/04/2022   Hematemesis 07/04/2022   Hypokalemia 07/04/2022   Lactic acidosis 07/04/2022   Pain due to onychomycosis of toenails of both feet 04/28/2022   Diabetic neuropathy, type II diabetes mellitus (HCC) 04/28/2022   De Quervain's tenosynovitis, left 11/13/2021   Bilateral injections given May 21, 2020 degenerative arthritis of knee, bilateral 05/21/2020   Traumatic hematoma of female breast 11/29/2019   Dizziness 11/29/2019   Hospital discharge follow-up 10/23/2019   NSTEMI (non-ST elevated myocardial infarction) (HCC) 10/10/2019   CAD (coronary artery disease) 10/10/2019   Chest pain 10/09/2019   Elevated troponin 10/09/2019   Uncontrolled type 2 diabetes mellitus with hyperglycemia (HCC) 10/09/2019   Obesity (BMI 30.0-34.9) 10/09/2019   Cervical spondylitis with radiculitis (HCC) 08/29/2019   Acute bursitis of left shoulder 01/30/2019   Chronic midline  posterior neck pain 06/28/2018   Urgency incontinence 06/28/2018   Degenerative joint disease of knee, left 04/26/2018   Carpal tunnel syndrome on both sides 03/29/2018   Anemia 01/02/2018   Class 1 obesity with serious comorbidity and body mass index (BMI) of 32.0 to 32.9 in adult 07/23/2017   Left hip pain 05/04/2017   Carpal tunnel syndrome on right 12/27/2016   Polyarthralgia 11/09/2016   Uncontrolled type 2 diabetes mellitus with retinopathy, with long-term current use of insulin 07/07/2016   Visit for preventive  health examination 06/21/2016   Increased urinary frequency 06/21/2016   Chronic meniscal tear of knee 08/19/2015   Left knee pain 06/19/2015   Candidiasis of skin 06/18/2015   Genital herpes 07/31/2014   Ulnar nerve compression 09/21/2013   Noncompliance with diet and medication regimen 11/15/2012   Boil, axilla 08/14/2012   Arthritis    Screening for breast cancer 08/04/2011   Screening for colon cancer 08/04/2011   HIDRADENITIS SUPPURATIVA 04/11/2007   GERD 11/17/2006   ENDOMETRIAL POLYP 11/17/2006   LOW BACK PAIN 11/17/2006   FIBROIDS, UTERUS 11/16/2006   Hyperlipidemia associated with type 2 diabetes mellitus (HCC) 11/16/2006   Essential hypertension 11/16/2006    PCP: Georganna Skeans, MD  REFERRING PROVIDER: Kathryne Hitch, MD  REFERRING DIAG: [phone] (ICD-10-CM) - Status post left knee replacement   THERAPY DIAG:  Acute pain of left knee  Stiffness of left knee, not elsewhere classified  Muscle weakness (generalized)  Other abnormalities of gait and mobility  Unsteadiness on feet  Localized edema  Rationale for Evaluation and Treatment: Rehabilitation  ONSET DATE: 02/26/23  SUBJECTIVE:   SUBJECTIVE STATEMENT:   She relays her knee is tight and painful   PERTINENT HISTORY: OA, DM, HLD, HTN, MI s/p stent  PAIN:  NPRS scale: 8/10 Pain location: Lt knee Pain description: throbbing Aggravating factors: constant, pressure, end range movements Relieving factors: repositioning, medications  PRECAUTIONS: Fall  WEIGHT BEARING RESTRICTIONS: No  FALLS:  Has patient fallen in last 6 months? Yes. 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[employer]; currently out on leave  PLOF: Independent and  Leisure: fishing  PATIENT GOALS: improve mobility and pain  NEXT MD VISIT: Not scheduled  OBJECTIVE:   PATIENT SURVEYS:  05/11/23 FOTO 38 04/05/23: FOTO 38 03/15/23: FOTO 24 (predicted 49)   COGNITION: Overall cognitive status: Within functional limits for tasks assessed     SENSATION: WFL  EDEMA:  03/15/23: Circumferential: Knee Joint Line: Rt: 34.5 cm/Lt: 39 cm   PALPATION: 03/15/23: very tender to light touch  LOWER EXTREMITY ROM:  ROM Left 03/15/23 Left 03/22/23 Left 03/24/23 Left 03/31/23 Left 04/05/23 Left 04/07/2023 After manual Left 04/09/23 [place] 04/15/2023 Left 04/16/23 Left 04/27/23 Left 04/28/20  Knee flexion P: 42 A: 40 P: 69*  End of session P:  78* After manual supine AA: 82* A: 63 P: 70 Supine passive heel slide  88  A: 73 P: 82 67 AROM  In supine heel slide - limited by pain A: 81 (supine heel slide) A: 80 Supine, increased pain  A: 70 P: 80  Knee extension P: -6 A: Unable  supine P: -5*  Supine Quad set -3* A: Seated LAQ: -15   -25 AROM in seated LAQ   -4 PROM in heel  prop TKE supine  A: -10 P: -4 supine P: 0 A: -18   (Blank rows = not tested)  LOWER EXTREMITY MMT:    03/15/23: Not formally tested but grossly 2/5 Lt knee  MMT Left 03/29/2023 Left 04/15/2023   Knee flexion 4/5 NT   Knee extension 3+/5 in available range NT    (Blank rows = not tested)  FUNCTIONAL TESTS:  03/15/23:  increased difficulty rising from a chair; decreased gait velocity  GAIT: 04/15/2023:  Ambulation c FWW similar to initial arrival techniques.   04/02/2023:  SPC trials in clinic c SBA and cues for use and sequencing.  Antalgic gait noted but not that deviated from gait style c FWW.   03/15/23 Distance walked: 100' Assistive device utilized: Environmental consultant - 2 wheeled Level of assistance: SBA Comments: antalgic gait, decreased speed   TODAY'S TREATMENT: 05/11/23 TherEx NuStep L7 x 10 min working on pushing into flexion in various seat positions Seated Lt LAQ with  3# 3x10; 3 sec hold within active range Sit to/from stand x 10 reps without UE support Seated knee flexion AAROM 5 sec X 10 Leg Press bil 56# 3x10; LLE 31# X 5 reps was all she could tolerate, then dropped down to 25# for another 5 reps was all she would tolerate. Step ups 4 inch step X 10 reps leading with left Leg and bilat UE support (cues to limit hip circumduction) Gait without AD throughout session, cues to bend knee during swing phase  Manual Seated knee flexion PROM with contract/relax, knee distraction mobs in between bouts  Modalities Vasopneumaitc: 34 deg, medium pressure, x 10 minutes  05/07/23 TherEx NuStep L6 x 10 min working on pushing into flexion in various seat positions Seated Lt LAQ with 3# 3x10; 3 sec hold within active range Sit to/from stand x 10 reps without UE support Leg Press bil 56# 3x10; LLE 31# 2x10  Neuro Re-ed Side stepping in // bars on balance beam x 5 laps  Manual Seated knee flexion PROM with contract/relax   Modalities Vasopneumaitc: 34 deg, medium pressure, x 10 minutes   05/05/2023 TherEx NuStep L6 x 10 min working on pushing into flexion in various seat positions.  Leg Press bilateral 56 lbs x 15 , Lt leg only 2 x 10 31 lbs  c 30 sec rest into flexion Supine heel slide with strap 10 sec hold with quad set 5 sec hold combo x 10 Lt leg  Manual Supine Lt knee flexion prolonged stretching, contract/relax for flexion gains.   Neuro Re-ed Tandem stance 1 min x 1 bilateral c occasional HHA for loss of balance.   Modalities Vasopneumaitc: 34 deg, medium pressure, x 10 minutes  04/30/23 TherEx NuStep L6 x 10 min working on pushing into flexion Leg Press bil 56# 3x10; 60 sec flexion hold between sets Forward step ups onto 6" step 2x10 with LLE; 1 UE support Squats on ramp 2x10 Seated LAQ 2x10  Modalities Vasopneumaitc: 34 deg, medium pressure, x 10 minutes  04/29/23 TherEx NuStep L6 x 10 min working on pushing into flexion Lt foot on  8" step with flexion holds 10 x 15 sec hold Forward step ups onto 8" step x15 reps; up with Lt down with Rt for Lt knee flexion AA heel slides x 10 reps ROM measurements -see above for details   HOME EXERCISE PROGRAM: Access Code: [email or web address] URL: [email or web address] Date: 03/29/2023 Prepared by: Chyrel Masson  Exercises - Quad Set (Mirrored)  - 5-10 x daily - 7 x  weekly - 1 sets - 5-10 reps - 5 sec hold - Supine Heel Slide with Strap  - 5-10 x daily - 7 x weekly - 1 sets - 5-10 reps - Seated Heel Slide (Mirrored)  - 5-10 x daily - 7 x weekly - 1 sets - 5-10 reps - Seated Quad Set (Mirrored)  - 5-10 x daily - 7 x weekly - 1 sets - 5-10 reps - 5 sec hold - Seated Long Arc Quad (Mirrored)  - 3-5 x daily - 7 x weekly - 1-2 sets - 10 reps - 2 hold - Seated Knee Flexion Extension AAROM with Overpressure (Mirrored)  - 5-6 x daily - 7 x weekly - 1 sets - 5 reps - 10 hold - Seated Straight Leg Heel Taps  - 1-2 x daily - 7 x weekly - 3 sets - 5-10 reps  ASSESSMENT: CLINICAL IMPRESSION: She continues to have pain and difficulty with her knee limiting her function. Her FOTO score was the same as last time. We will continue to work as hard as possible to improve her knee ROM and strength as tolerated to improve her function.   OBJECTIVE IMPAIRMENTS: Abnormal gait, decreased balance, decreased endurance, decreased mobility, difficulty walking, decreased ROM, decreased strength, hypomobility, increased edema, increased fascial restrictions, increased muscle spasms, and pain.   ACTIVITY LIMITATIONS: carrying, lifting, bending, sitting, standing, squatting, sleeping, stairs, transfers, bed mobility, toileting, dressing, and locomotion level  PARTICIPATION LIMITATIONS: meal prep, cleaning, laundry, driving, shopping, community activity, and occupation  PERSONAL FACTORS: 3+ comorbidities: OA, DM, HLD, HTN, MI s/p stent  are also affecting patient's functional outcome.   REHAB POTENTIAL:  Fair elevated pain, limited ROM and tolerance to activity  CLINICAL DECISION MAKING: Evolving/moderate complexity  EVALUATION COMPLEXITY: Moderate   GOALS: Goals reviewed with patient? Yes  SHORT TERM GOALS: Target date: 04/12/2023  Independent with initial HEP Goal status  Met  2.  Lt knee AROM 5-60 deg for improved function Goal status: mostly met  LONG TERM GOALS: Target date: 06/10/2023  Independent with final HEP Goal status: revised 04/15/2023  2.  FOTO score improved to 49 Goal status: revised 04/15/2023  3.  Lt knee AROM improved to 0-100 for improved mobility for daily activity  Goal status: revised 04/15/2023  4.  Report pain < 3/10 with standing and walking for improved function Goal status: revised 04/15/2023  5.  Demonstrate ability to amb with LRAD modified independent for improved mobility Goal status: revised 04/15/2023  6.  Patient will demonstrate/report ability to ascend/descend stairs with single hand rail and reciprocal gait pattern.  Goal status: revised 04/15/2023  7.  Patient will demonstrate Lt knee MMT 5/5 throughout to facilitate stairs, transfers and ambulation at PLOF.    PLAN:  PT FREQUENCY: every business day for 1 week post manip, then 2-3 x per week   PT DURATION: 8 weeks  PLANNED INTERVENTIONS: Therapeutic exercises, Therapeutic activity, Neuromuscular re-education, Balance training, Gait training, Patient/Family education, Self Care, Joint mobilization, Stair training, DME instructions, Aquatic Therapy, Dry Needling, Electrical stimulation, Cryotherapy, Moist heat, Taping, Vasopneumatic device, Manual therapy, and Re-evaluation  PLAN FOR NEXT SESSION: continue to maximize range and function within available range   Ivery Quale, PT, DPT 05/11/23 8:11 AM

## 2023-05-14 ENCOUNTER — Encounter: Payer: Self-pay | Admitting: Physical Therapy

## 2023-05-14 ENCOUNTER — Ambulatory Visit (INDEPENDENT_AMBULATORY_CARE_PROVIDER_SITE_OTHER): Payer: BC Managed Care – PPO | Admitting: Physical Therapy

## 2023-05-14 DIAGNOSIS — M25562 Pain in left knee: Secondary | ICD-10-CM | POA: Diagnosis not present

## 2023-05-14 DIAGNOSIS — R2689 Other abnormalities of gait and mobility: Secondary | ICD-10-CM

## 2023-05-14 DIAGNOSIS — M6281 Muscle weakness (generalized): Secondary | ICD-10-CM | POA: Diagnosis not present

## 2023-05-14 DIAGNOSIS — R2681 Unsteadiness on feet: Secondary | ICD-10-CM

## 2023-05-14 DIAGNOSIS — R600 Localized edema: Secondary | ICD-10-CM

## 2023-05-14 DIAGNOSIS — M25662 Stiffness of left knee, not elsewhere classified: Secondary | ICD-10-CM

## 2023-05-14 NOTE — Therapy (Signed)
OUTPATIENT PHYSICAL THERAPY TREATMENT   Patient Name: [name] MRN: [id number] DOB:July 21, 1961, 62 y.o., female Today's Date: 05/14/2023    END OF SESSION:  PT End of Session - 05/14/23 0806     Visit Number 27    Number of Visits 28    Date for PT Re-Evaluation 06/10/23    Authorization Type [employer] State $72 copay    PT Start Time 0801    PT Stop Time 0851    PT Time Calculation (min) 50 min    Activity Tolerance Patient limited by pain    Behavior During Therapy [facility] for tasks assessed/performed                   Past Medical History:  Diagnosis Date   Arthritis    Campylobacter enteritis May 2012   [facility] admission   Carpal tunnel syndrome, bilateral    Diabetes mellitus    Gastritis with bleeding due to alcohol 2010   [facility] admission   GERD (gastroesophageal reflux disease)    Hyperlipidemia    Hypertension    no meds   Menopause    Myocardial infarction (HCC)    Neck pain    Wears glasses    Past Surgical History:  Procedure Laterality Date   CARPAL TUNNEL RELEASE Left 05/14/2014   Procedure: LEFT ULNAR NEUROPLASTY AT ELBOW AND ENDOSCOPIC CARPAL TUNNEL RELEASE;  Surgeon: Jodi Marble, MD;  Location: [facility];  Service: Orthopedics;  Laterality: Left;   CORONARY STENT INTERVENTION N/A 10/09/2019   Procedure: CORONARY STENT INTERVENTION;  Surgeon: Yates Decamp, MD;  Location: [facility];  Service: Cardiovascular;  Laterality: N/A;   CYST EXCISION  1995   under arms   endometrial biopsy  2004   benign   FOOT OSTEOTOMY  2010   right-spurs   LEFT HEART CATH AND CORONARY ANGIOGRAPHY N/A 10/09/2019   Procedure: LEFT HEART CATH AND CORONARY ANGIOGRAPHY;  Surgeon: Yates Decamp, MD;  Location: [facility];  Service: Cardiovascular;  Laterality: N/A;   TONSILLECTOMY     TOTAL KNEE ARTHROPLASTY Left 02/26/2023   Procedure: LEFT TOTAL KNEE ARTHROPLASTY;  Surgeon: Kathryne Hitch, MD;  Location: [facility];  Service:  Orthopedics;  Laterality: Left;  Needs RNFA   ULNAR NERVE TRANSPOSITION Left 05/14/2014   Procedure: ULNAR NERVE DECOMPRESSION/TRANSPOSITION;  Surgeon: Jodi Marble, MD;  Location: [facility];  Service: Orthopedics;  Laterality: Left;   Patient Active Problem List   Diagnosis Date Noted   Arthrofibrosis of total knee arthroplasty, subsequent encounter 04/07/2023   Status post left knee replacement 02/26/2023   Unilateral primary osteoarthritis, left knee 09/09/2022   Intractable nausea and vomiting 07/04/2022   Hematemesis 07/04/2022   Hypokalemia 07/04/2022   Lactic acidosis 07/04/2022   Pain due to onychomycosis of toenails of both feet 04/28/2022   Diabetic neuropathy, type II diabetes mellitus (HCC) 04/28/2022   De Quervain's tenosynovitis, left 11/13/2021   Bilateral injections given May 21, 2020 degenerative arthritis of knee, bilateral 05/21/2020   Traumatic hematoma of female breast 11/29/2019   Dizziness 11/29/2019   Hospital discharge follow-up 10/23/2019   NSTEMI (non-ST elevated myocardial infarction) (HCC) 10/10/2019   CAD (coronary artery disease) 10/10/2019   Chest pain 10/09/2019   Elevated troponin 10/09/2019   Uncontrolled type 2 diabetes mellitus with hyperglycemia (HCC) 10/09/2019   Obesity (BMI 30.0-34.9) 10/09/2019   Cervical spondylitis with radiculitis (HCC) 08/29/2019   Acute bursitis of left shoulder 01/30/2019   Chronic  midline posterior neck pain 06/28/2018   Urgency incontinence 06/28/2018   Degenerative joint disease of knee, left 04/26/2018   Carpal tunnel syndrome on both sides 03/29/2018   Anemia 01/02/2018   Class 1 obesity with serious comorbidity and body mass index (BMI) of 32.0 to 32.9 in adult 07/23/2017   Left hip pain 05/04/2017   Carpal tunnel syndrome on right 12/27/2016   Polyarthralgia 11/09/2016   Uncontrolled type 2 diabetes mellitus with retinopathy, with long-term current use of insulin 07/07/2016   Visit for  preventive health examination 06/21/2016   Increased urinary frequency 06/21/2016   Chronic meniscal tear of knee 08/19/2015   Left knee pain 06/19/2015   Candidiasis of skin 06/18/2015   Genital herpes 07/31/2014   Ulnar nerve compression 09/21/2013   Noncompliance with diet and medication regimen 11/15/2012   Boil, axilla 08/14/2012   Arthritis    Screening for breast cancer 08/04/2011   Screening for colon cancer 08/04/2011   HIDRADENITIS SUPPURATIVA 04/11/2007   GERD 11/17/2006   ENDOMETRIAL POLYP 11/17/2006   LOW BACK PAIN 11/17/2006   FIBROIDS, UTERUS 11/16/2006   Hyperlipidemia associated with type 2 diabetes mellitus (HCC) 11/16/2006   Essential hypertension 11/16/2006    PCP: Georganna Skeans, MD  REFERRING PROVIDER: Kathryne Hitch, MD  REFERRING DIAG: [phone] (ICD-10-CM) - Status post left knee replacement   THERAPY DIAG:  Acute pain of left knee  Stiffness of left knee, not elsewhere classified  Muscle weakness (generalized)  Other abnormalities of gait and mobility  Unsteadiness on feet  Localized edema  Rationale for Evaluation and Treatment: Rehabilitation  ONSET DATE: 02/26/23  SUBJECTIVE:   SUBJECTIVE STATEMENT:   Hamstring feels painful, having increased urinary incontinence - will see her PCP to address   PERTINENT HISTORY: OA, DM, HLD, HTN, MI s/p stent  PAIN:  NPRS scale: 8/10 Pain location: Lt knee Pain description: throbbing Aggravating factors: constant, pressure, end range movements Relieving factors: repositioning, medications  PRECAUTIONS: Fall  WEIGHT BEARING RESTRICTIONS: No  FALLS:  Has patient fallen in last 6 months? Yes. 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[employer]; currently out on leave  PLOF: Independent and Leisure: fishing  PATIENT GOALS: improve mobility and pain  NEXT MD VISIT: Not scheduled  OBJECTIVE:   PATIENT SURVEYS:  05/11/23 FOTO 38 04/05/23: FOTO 38 03/15/23: FOTO 24 (predicted 49)   COGNITION: Overall cognitive status: Within functional limits for tasks assessed     SENSATION: WFL  EDEMA:  03/15/23: Circumferential: Knee Joint Line: Rt: 34.5 cm/Lt: 39 cm   PALPATION: 03/15/23: very tender to light touch  LOWER EXTREMITY ROM:  ROM Left 03/15/23 Left 03/22/23 Left 03/24/23 Left 03/31/23 Left 04/05/23 Left 04/07/2023 After manual Left 04/09/23 [place] 04/15/2023 Left 04/16/23 Left 04/27/23 Left 04/28/20  Knee flexion P: 42 A: 40 P: 69*  End of session P:  78* After manual supine AA: 82* A: 63 P: 70 Supine passive heel slide  88  A: 73 P: 82 67 AROM  In supine heel slide - limited by pain A: 81 (supine heel slide) A: 80 Supine, increased pain  A: 70 P: 80  Knee extension P: -6 A: Unable  supine P: -5*  Supine Quad set -3* A: Seated LAQ: -15   -25 AROM in seated  LAQ   -4 PROM in heel prop TKE supine  A: -10 P: -4 supine P: 0 A: -18   (Blank rows = not tested)  LOWER EXTREMITY MMT:    03/15/23: Not formally tested but grossly 2/5 Lt knee  MMT Left 03/29/2023 Left 04/15/2023   Knee flexion 4/5 NT   Knee extension 3+/5 in available range NT    (Blank rows = not tested)  FUNCTIONAL TESTS:  03/15/23:  increased difficulty rising from a chair; decreased gait velocity  GAIT: 04/15/2023:  Ambulation c FWW similar to initial arrival techniques.   04/02/2023:  SPC trials in clinic c SBA and cues for use and sequencing.  Antalgic gait noted but not that deviated from gait style c FWW.   03/15/23 Distance walked: 100' Assistive device utilized: Environmental consultant - 2 wheeled Level of assistance: SBA Comments: antalgic gait, decreased speed   TODAY'S TREATMENT: 05/14/23 TherEx NuStep L7 x 10 min working on pushing  into flexion in various seat positions Leg Press bil 56# 3x10; LLE 25# 3x10 Flexion holds 10x10 sec on 6" step Forward step ups onto 6" step bil UE needed 2x10  Modalities Vasopneumaitc: 34 deg, medium pressure, x 10 minutes   05/11/23 TherEx NuStep L7 x 10 min working on pushing into flexion in various seat positions Seated Lt LAQ with 3# 3x10; 3 sec hold within active range Sit to/from stand x 10 reps without UE support Seated knee flexion AAROM 5 sec X 10 Leg Press bil 56# 3x10; LLE 31# X 5 reps was all she could tolerate, then dropped down to 25# for another 5 reps was all she would tolerate. Step ups 4 inch step X 10 reps leading with left Leg and bilat UE support (cues to limit hip circumduction) Gait without AD throughout session, cues to bend knee during swing phase  Manual Seated knee flexion PROM with contract/relax, knee distraction mobs in between bouts  Modalities Vasopneumaitc: 34 deg, medium pressure, x 10 minutes  05/07/23 TherEx NuStep L6 x 10 min working on pushing into flexion in various seat positions Seated Lt LAQ with 3# 3x10; 3 sec hold within active range Sit to/from stand x 10 reps without UE support Leg Press bil 56# 3x10; LLE 31# 2x10  Neuro Re-ed Side stepping in // bars on balance beam x 5 laps  Manual Seated knee flexion PROM with contract/relax   Modalities Vasopneumaitc: 34 deg, medium pressure, x 10 minutes   HOME EXERCISE PROGRAM: Access Code: [email or web address] URL: [email or web address] Date: 03/29/2023 Prepared by: Chyrel Masson  Exercises - Quad Set (Mirrored)  - 5-10 x daily - 7 x weekly - 1 sets - 5-10 reps - 5 sec hold - Supine Heel Slide with Strap  - 5-10 x daily - 7 x weekly - 1 sets - 5-10 reps - Seated Heel Slide (Mirrored)  - 5-10 x daily - 7 x weekly - 1 sets - 5-10 reps - Seated Quad Set (Mirrored)  - 5-10 x daily - 7 x weekly - 1 sets - 5-10 reps - 5 sec hold - Seated Long Arc Quad (Mirrored)  - 3-5 x daily - 7 x  weekly - 1-2 sets - 10 reps - 2 hold - Seated Knee Flexion Extension AAROM with Overpressure (Mirrored)  - 5-6 x daily - 7 x weekly - 1 sets - 5 reps - 10 hold - Seated Straight Leg Heel Taps  - 1-2 x daily - 7 x weekly - 3 sets - 5-10 reps  ASSESSMENT: CLINICAL  IMPRESSION: Pt tolerated session well today working on functional activities within her available range.  She is nearing end of POC and will need to recert or transition to HEP.  OBJECTIVE IMPAIRMENTS: Abnormal gait, decreased balance, decreased endurance, decreased mobility, difficulty walking, decreased ROM, decreased strength, hypomobility, increased edema, increased fascial restrictions, increased muscle spasms, and pain.   ACTIVITY LIMITATIONS: carrying, lifting, bending, sitting, standing, squatting, sleeping, stairs, transfers, bed mobility, toileting, dressing, and locomotion level  PARTICIPATION LIMITATIONS: meal prep, cleaning, laundry, driving, shopping, community activity, and occupation  PERSONAL FACTORS: 3+ comorbidities: OA, DM, HLD, HTN, MI s/p stent  are also affecting patient's functional outcome.   REHAB POTENTIAL: Fair elevated pain, limited ROM and tolerance to activity  CLINICAL DECISION MAKING: Evolving/moderate complexity  EVALUATION COMPLEXITY: Moderate   GOALS: Goals reviewed with patient? Yes  SHORT TERM GOALS: Target date: 04/12/2023  Independent with initial HEP Goal status  Met  2.  Lt knee AROM 5-60 deg for improved function Goal status: mostly met  LONG TERM GOALS: Target date: 06/10/2023  Independent with final HEP Goal status: revised 04/15/2023  2.  FOTO score improved to 49 Goal status: revised 04/15/2023  3.  Lt knee AROM improved to 0-100 for improved mobility for daily activity  Goal status: revised 04/15/2023  4.  Report pain < 3/10 with standing and walking for improved function Goal status: revised 04/15/2023  5.  Demonstrate ability to amb with LRAD modified independent for  improved mobility Goal status: revised 04/15/2023  6.  Patient will demonstrate/report ability to ascend/descend stairs with single hand rail and reciprocal gait pattern.  Goal status: revised 04/15/2023  7.  Patient will demonstrate Lt knee MMT 5/5 throughout to facilitate stairs, transfers and ambulation at PLOF.    PLAN:  PT FREQUENCY: every business day for 1 week post manip, then 2-3 x per week   PT DURATION: 8 weeks  PLANNED INTERVENTIONS: Therapeutic exercises, Therapeutic activity, Neuromuscular re-education, Balance training, Gait training, Patient/Family education, Self Care, Joint mobilization, Stair training, DME instructions, Aquatic Therapy, Dry Needling, Electrical stimulation, Cryotherapy, Moist heat, Taping, Vasopneumatic device, Manual therapy, and Re-evaluation  PLAN FOR NEXT SESSION: continue to maximize range and function within available range, ? Recert or d/c   Clarita Crane, PT, DPT 05/14/23 10:06 AM

## 2023-05-18 ENCOUNTER — Ambulatory Visit (INDEPENDENT_AMBULATORY_CARE_PROVIDER_SITE_OTHER): Payer: BC Managed Care – PPO | Admitting: Physical Therapy

## 2023-05-18 ENCOUNTER — Encounter: Payer: Self-pay | Admitting: Physical Therapy

## 2023-05-18 DIAGNOSIS — R600 Localized edema: Secondary | ICD-10-CM

## 2023-05-18 DIAGNOSIS — R2689 Other abnormalities of gait and mobility: Secondary | ICD-10-CM

## 2023-05-18 DIAGNOSIS — M6281 Muscle weakness (generalized): Secondary | ICD-10-CM | POA: Diagnosis not present

## 2023-05-18 DIAGNOSIS — M25662 Stiffness of left knee, not elsewhere classified: Secondary | ICD-10-CM | POA: Diagnosis not present

## 2023-05-18 DIAGNOSIS — M25562 Pain in left knee: Secondary | ICD-10-CM | POA: Diagnosis not present

## 2023-05-18 DIAGNOSIS — R2681 Unsteadiness on feet: Secondary | ICD-10-CM

## 2023-05-18 NOTE — Therapy (Signed)
OUTPATIENT PHYSICAL THERAPY TREATMENT DISCHARGE SUMMARY   Patient Name: [name] MRN: [id number] DOB:1961/11/05, 62 y.o., female Today's Date: 05/18/2023    END OF SESSION:  PT End of Session - 05/18/23 0809     Visit Number 28    Authorization Type [employer] State $72 copay    PT Start Time 0805    PT Stop Time 0841    PT Time Calculation (min) 36 min    Activity Tolerance Patient limited by pain    Behavior During Therapy [facility] for tasks assessed/performed                    Past Medical History:  Diagnosis Date   Arthritis    Campylobacter enteritis May 2012   [facility] admission   Carpal tunnel syndrome, bilateral    Diabetes mellitus    Gastritis with bleeding due to alcohol 2010   [facility] admission   GERD (gastroesophageal reflux disease)    Hyperlipidemia    Hypertension    no meds   Menopause    Myocardial infarction (HCC)    Neck pain    Wears glasses    Past Surgical History:  Procedure Laterality Date   CARPAL TUNNEL RELEASE Left 05/14/2014   Procedure: LEFT ULNAR NEUROPLASTY AT ELBOW AND ENDOSCOPIC CARPAL TUNNEL RELEASE;  Surgeon: Jodi Marble, MD;  Location:  SURGERY CENTER;  Service: Orthopedics;  Laterality: Left;   CORONARY STENT INTERVENTION N/A 10/09/2019   Procedure: CORONARY STENT INTERVENTION;  Surgeon: Yates Decamp, MD;  Location: [facility] INVASIVE CV LAB;  Service: Cardiovascular;  Laterality: N/A;   CYST EXCISION  1995   under arms   endometrial biopsy  2004   benign   FOOT OSTEOTOMY  2010   right-spurs   LEFT HEART CATH AND CORONARY ANGIOGRAPHY N/A 10/09/2019   Procedure: LEFT HEART CATH AND CORONARY ANGIOGRAPHY;  Surgeon: Yates Decamp, MD;  Location: [facility] INVASIVE CV LAB;  Service: Cardiovascular;  Laterality: N/A;   TONSILLECTOMY     TOTAL KNEE ARTHROPLASTY Left 02/26/2023   Procedure: LEFT TOTAL KNEE ARTHROPLASTY;  Surgeon: Kathryne Hitch, MD;  Location: [facility] ORS;  Service: Orthopedics;  Laterality: Left;  Needs RNFA   ULNAR  NERVE TRANSPOSITION Left 05/14/2014   Procedure: ULNAR NERVE DECOMPRESSION/TRANSPOSITION;  Surgeon: Jodi Marble, MD;  Location:  SURGERY CENTER;  Service: Orthopedics;  Laterality: Left;   Patient Active Problem List   Diagnosis Date Noted   Arthrofibrosis of total knee arthroplasty, subsequent encounter 04/07/2023   Status post left knee replacement 02/26/2023   Unilateral primary osteoarthritis, left knee 09/09/2022   Intractable nausea and vomiting 07/04/2022   Hematemesis 07/04/2022   Hypokalemia 07/04/2022   Lactic acidosis 07/04/2022   Pain due to onychomycosis of toenails of both feet 04/28/2022   Diabetic neuropathy, type II diabetes mellitus (HCC) 04/28/2022   De Quervain's tenosynovitis, left 11/13/2021   Bilateral injections given May 21, 2020 degenerative arthritis of knee, bilateral 05/21/2020   Traumatic hematoma of female breast 11/29/2019   Dizziness 11/29/2019   Hospital discharge follow-up 10/23/2019   NSTEMI (non-ST elevated myocardial infarction) (HCC) 10/10/2019   CAD (coronary artery disease) 10/10/2019   Chest pain 10/09/2019   Elevated troponin 10/09/2019   Uncontrolled type 2 diabetes mellitus with hyperglycemia (HCC) 10/09/2019   Obesity (BMI 30.0-34.9) 10/09/2019   Cervical spondylitis with radiculitis (HCC) 08/29/2019   Acute bursitis of left shoulder 01/30/2019   Chronic midline posterior neck pain 06/28/2018   Urgency incontinence 06/28/2018  Degenerative joint disease of knee, left 04/26/2018   Carpal tunnel syndrome on both sides 03/29/2018   Anemia 01/02/2018   Class 1 obesity with serious comorbidity and body mass index (BMI) of 32.0 to 32.9 in adult 07/23/2017   Left hip pain 05/04/2017   Carpal tunnel syndrome on right 12/27/2016   Polyarthralgia 11/09/2016   Uncontrolled type 2 diabetes mellitus with retinopathy, with long-term current use of insulin 07/07/2016   Visit for preventive health examination 06/21/2016   Increased  urinary frequency 06/21/2016   Chronic meniscal tear of knee 08/19/2015   Left knee pain 06/19/2015   Candidiasis of skin 06/18/2015   Genital herpes 07/31/2014   Ulnar nerve compression 09/21/2013   Noncompliance with diet and medication regimen 11/15/2012   Boil, axilla 08/14/2012   Arthritis    Screening for breast cancer 08/04/2011   Screening for colon cancer 08/04/2011   HIDRADENITIS SUPPURATIVA 04/11/2007   GERD 11/17/2006   ENDOMETRIAL POLYP 11/17/2006   LOW BACK PAIN 11/17/2006   FIBROIDS, UTERUS 11/16/2006   Hyperlipidemia associated with type 2 diabetes mellitus (HCC) 11/16/2006   Essential hypertension 11/16/2006    PCP: Georganna Skeans, MD  REFERRING PROVIDER: Kathryne Hitch, MD  REFERRING DIAG: [phone] (ICD-10-CM) - Status post left knee replacement   THERAPY DIAG:  Acute pain of left knee  Stiffness of left knee, not elsewhere classified  Muscle weakness (generalized)  Other abnormalities of gait and mobility  Unsteadiness on feet  Localized edema  Rationale for Evaluation and Treatment: Rehabilitation  ONSET DATE: 02/26/23  SUBJECTIVE:   SUBJECTIVE STATEMENT:   Doing okay;  Hamstring feels painful, having increased urinary incontinence - will see her PCP to address   PERTINENT HISTORY: OA, DM, HLD, HTN, MI s/p stent  PAIN:  NPRS scale: 8/10 Pain location: Lt knee Pain description: throbbing Aggravating factors: constant, pressure, end range movements Relieving factors: repositioning, medications  PRECAUTIONS: Fall  WEIGHT BEARING RESTRICTIONS: No  FALLS:  Has patient fallen in last 6 months? Yes. 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[employer]; currently out on  leave  PLOF: Independent and Leisure: fishing  PATIENT GOALS: improve mobility and pain  NEXT MD VISIT: Not scheduled  OBJECTIVE:   PATIENT SURVEYS:  05/18/23: FOTO 36 05/11/23 FOTO 38 04/05/23: FOTO 38 03/15/23: FOTO 24 (predicted 49)   COGNITION: Overall cognitive status: Within functional limits for tasks assessed     SENSATION: WFL  EDEMA:  03/15/23: Circumferential: Knee Joint Line: Rt: 34.5 cm/Lt: 39 cm   PALPATION: 03/15/23: very tender to light touch  LOWER EXTREMITY ROM:  ROM Left 03/15/23 Left 03/22/23 Left 03/24/23 Left 03/31/23 Left 04/05/23 Left 04/07/2023 After manual Left 04/09/23 [place] 04/15/2023 Left 04/16/23 Left 04/27/23 Left 04/28/20 Left 05/18/23  Knee flexion P: 42 A: 40 P: 69*  End of session P:  78* After manual supine AA: 82* A: 63 P: 70 Supine passive heel slide  88  A: 73 P: 82 67 AROM  In supine heel slide - limited by pain A: 81 (supine heel slide) A: 80 Supine, increased pain  A: 70 P: 80 A: 90  Knee extension P: -6 A: Unable  supine P: -5*  Supine Quad set -3* A: Seated LAQ: -15   -25 AROM in seated LAQ   -  4 PROM in heel prop TKE supine  A: -10 P: -4 supine P: 0 A: -18 A: -25 P: 0   (Blank rows = not tested)  LOWER EXTREMITY MMT:    03/15/23: Not formally tested but grossly 2/5 Lt knee  MMT Left 03/29/2023 Left 04/15/2023 Left 05/18/23  Knee flexion 4/5 NT 3-/5  Knee extension 3+/5 in available range NT 4/5 in available range   (Blank rows = not tested)  FUNCTIONAL TESTS:  03/15/23:  increased difficulty rising from a chair; decreased gait velocity  GAIT: 05/18/23: amb with SPC modified independent today; trial of stairs x 4 steps but needs minguard A for reciprocal negotiation so recommend step to for safety  04/15/2023:  Ambulation c FWW similar to initial arrival techniques.   04/02/2023:  SPC trials in clinic c SBA and cues for use and sequencing.  Antalgic gait noted but not that deviated from gait style c FWW.    03/15/23 Distance walked: 100' Assistive device utilized: Environmental consultant - 2 wheeled Level of assistance: SBA Comments: antalgic gait, decreased speed   TODAY'S TREATMENT: 05/18/23 TherEx SciFit bike seat 9; full revolutions x 8 min ROM and MMT - see above for details Discussed HEP and recommended she continue; also plans to join fitness center so will continue with exercise there as well.  Gait Training See above for details on gait and stair negotiation  05/14/23 TherEx NuStep L7 x 10 min working on pushing into flexion in various seat positions Leg Press bil 56# 3x10; LLE 25# 3x10 Flexion holds 10x10 sec on 6" step Forward step ups onto 6" step bil UE needed 2x10  Modalities Vasopneumaitc: 34 deg, medium pressure, x 10 minutes   05/11/23 TherEx NuStep L7 x 10 min working on pushing into flexion in various seat positions Seated Lt LAQ with 3# 3x10; 3 sec hold within active range Sit to/from stand x 10 reps without UE support Seated knee flexion AAROM 5 sec X 10 Leg Press bil 56# 3x10; LLE 31# X 5 reps was all she could tolerate, then dropped down to 25# for another 5 reps was all she would tolerate. Step ups 4 inch step X 10 reps leading with left Leg and bilat UE support (cues to limit hip circumduction) Gait without AD throughout session, cues to bend knee during swing phase  Manual Seated knee flexion PROM with contract/relax, knee distraction mobs in between bouts  Modalities Vasopneumaitc: 34 deg, medium pressure, x 10 minutes  05/07/23 TherEx NuStep L6 x 10 min working on pushing into flexion in various seat positions Seated Lt LAQ with 3# 3x10; 3 sec hold within active range Sit to/from stand x 10 reps without UE support Leg Press bil 56# 3x10; LLE 31# 2x10  Neuro Re-ed Side stepping in // bars on balance beam x 5 laps  Manual Seated knee flexion PROM with contract/relax   Modalities Vasopneumaitc: 34 deg, medium pressure, x 10 minutes   HOME EXERCISE  PROGRAM: Access Code: [email or web address] URL: [email or web address] Date: 03/29/2023 Prepared by: Chyrel Masson  Exercises - Quad Set (Mirrored)  - 5-10 x daily - 7 x weekly - 1 sets - 5-10 reps - 5 sec hold - Supine Heel Slide with Strap  - 5-10 x daily - 7 x weekly - 1 sets - 5-10 reps - Seated Heel Slide (Mirrored)  - 5-10 x daily - 7 x weekly - 1 sets - 5-10 reps - Seated Quad Set (Mirrored)  - 5-10 x daily - 7 x weekly -  1 sets - 5-10 reps - 5 sec hold - Seated Long Arc Quad (Mirrored)  - 3-5 x daily - 7 x weekly - 1-2 sets - 10 reps - 2 hold - Seated Knee Flexion Extension AAROM with Overpressure (Mirrored)  - 5-6 x daily - 7 x weekly - 1 sets - 5 reps - 10 hold - Seated Straight Leg Heel Taps  - 1-2 x daily - 7 x weekly - 3 sets - 5-10 reps  ASSESSMENT: CLINICAL IMPRESSION: Pt has only met 2 LTGs at this time with extensive PT.  Feel we have maximized rehab potential at this time and pt in agreement to transition to HEP and community fitness.  Encouraged she continue to work on exercises and can revisit therapy in the future if needed.  Will d/c today.  OBJECTIVE IMPAIRMENTS: Abnormal gait, decreased balance, decreased endurance, decreased mobility, difficulty walking, decreased ROM, decreased strength, hypomobility, increased edema, increased fascial restrictions, increased muscle spasms, and pain.   ACTIVITY LIMITATIONS: carrying, lifting, bending, sitting, standing, squatting, sleeping, stairs, transfers, bed mobility, toileting, dressing, and locomotion level  PARTICIPATION LIMITATIONS: meal prep, cleaning, laundry, driving, shopping, community activity, and occupation  PERSONAL FACTORS: 3+ comorbidities: OA, DM, HLD, HTN, MI s/p stent  are also affecting patient's functional outcome.   REHAB POTENTIAL: Fair elevated pain, limited ROM and tolerance to activity  CLINICAL DECISION MAKING: Evolving/moderate complexity  EVALUATION COMPLEXITY: Moderate   GOALS: Goals  reviewed with patient? Yes  SHORT TERM GOALS: Target date: 04/12/2023  Independent with initial HEP Goal status  Met  2.  Lt knee AROM 5-60 deg for improved function Goal status: mostly met  LONG TERM GOALS: Target date: 06/10/2023  Independent with final HEP Goal status: MET 05/18/23  2.  FOTO score improved to 49 Goal status: NOT MET 05/18/23  3.  Lt knee AROM improved to 0-100 for improved mobility for daily activity  Goal status: NOT MET 05/18/23  4.  Report pain < 3/10 with standing and walking for improved function Goal status: NOT MET 05/18/23  5.  Demonstrate ability to amb with LRAD modified independent for improved mobility Goal status: MET 05/18/23  6.  Patient will demonstrate/report ability to ascend/descend stairs with single hand rail and reciprocal gait pattern.  Goal status: NOT MET 05/18/23  7.  Patient will demonstrate Lt knee MMT 5/5 throughout to facilitate stairs, transfers and ambulation at PLOF.  Goal status: NOT MET 05/18/23  PLAN:  PT FREQUENCY: every business day for 1 week post manip, then 2-3 x per week   PT DURATION: 8 weeks  PLANNED INTERVENTIONS: Therapeutic exercises, Therapeutic activity, Neuromuscular re-education, Balance training, Gait training, Patient/Family education, Self Care, Joint mobilization, Stair training, DME instructions, Aquatic Therapy, Dry Needling, Electrical stimulation, Cryotherapy, Moist heat, Taping, Vasopneumatic device, Manual therapy, and Re-evaluation  PLAN FOR NEXT SESSION: d/c PT today  Clarita Crane, PT, DPT 05/18/23 11:53 AM    PHYSICAL THERAPY DISCHARGE SUMMARY  Visits from Start of Care: 28  Current functional level related to goals / functional outcomes: See above   Remaining deficits: See above   Education / Equipment: HEP   Patient agrees to discharge. Patient goals were partially met. Patient is being discharged due to maximized rehab potential.    Clarita Crane, PT,  DPT 05/18/23 11:53 AM  [facility] [address] [place], [place], [address] Phone: [phone]   Fax:  [phone]

## 2023-05-20 ENCOUNTER — Encounter: Payer: BC Managed Care – PPO | Admitting: *Deleted

## 2023-05-20 DIAGNOSIS — Z006 Encounter for examination for normal comparison and control in clinical research program: Secondary | ICD-10-CM

## 2023-05-20 NOTE — Research (Addendum)
AA HEART Informed Consent   Subject Name: [name]  Subject met inclusion and exclusion criteria.  The informed consent form, study requirements and expectations were reviewed with the subject and questions and concerns were addressed prior to the signing of the consent form.  The subject verbalized understanding of the trial requirements.  The subject agreed to participate in the AA HEART trial and signed the informed consent at 10:10 am on 05-20-2023.  The informed consent was obtained prior to performance of any protocol-specific procedures for the subject.  A copy of the signed informed consent was given to the subject and a copy was placed in the subject's medical record.   [place] [employer] manager, [employer] food  Blood pressure 144/85 Heart Rate: 90 Temp: 97.9 Spo2: 100  Resp 16   Are there any labs that are clinically significant?  Yes []  OR No[]    LABORATORY REPORT                                              ACCESSION NO. [id number]                                             Page 1 of 1                                                        INVESTIGATOR: ([id number])                          PROTOCOL   [id number]                     Thomasene Ripple, M.D.                              INVESTIGATOR NO.: [id number]                     c/o [facility] [facility]                         SUBJECT NUMBER: [id number]                     [place]                 SUBJECT INITIALS NOT COLLECTED:                     [address]                          VISIT: D0                     [place], [place] 0                   SPONSOR REPORT TO:  COLLECTION TIME:11:11 DATE:20-May-2023                     Cruzita Lederer                 DATE RECEIVED IN LABORATORY: 21-May-2023                     c/o Sponsor [employer] access         DATE REPORTED BY LABORATORY: 21-May-2023                     [place]                          SEX: F  AGE: 62y                      [address]                   [place], [place] [phone]                                                                                        Ref. Ranges               Clinical    Comments                                                                          Significance                                                                            Yes*  No                    HEMATOLOGY&DIFFERENTIAL PANEL                      HGB            13.3         11.5-15.8 g/dL                      HCT            44           34-48 %                      RBC            4.7          3.9-5.5 x106/uL  MCH            28           26-34 pg                      MCHC           30      L    31-38 g/dL                      RDW            14.4         12.0-15.0 %                      RBC Morph      No Review Required                        MCV            93           80-100 fL                      WBC            7.82         3.80-10.70 x103/uL                      Neutrophil     5.22         1.96-7.23 x103/uL                      Lymphocyte     2.06         0.80-3.00 x103/uL                      Monocytes      0.40         0.12-0.92 x103/uL                      Eosinophil     0.08         0.00-0.57 x103/uL                      Basophils      0.06         0.00-0.20 x103/uL                      Neutrophil     66.8         40.5-75.0 %                      Lymphocyte     26.4         15.4-48.5%                      Monocytes      5.1          2.6-10.1 %                      Eosinophil     1.0          0.0-6.8 %                      Basophils  0.7          0.0-2.0 %                      Platelets      398     H    130-394 x103/uL  [id number] LPA COLLECTION D/T                      Untimed D      20-May-2023                        Untimed T      11:11                            SM/PLASMA PROTEO & BMS D/T                      Untimed D      20-May-2023                         Untimed T      11:11                            SM/WB DNA COLLECTION D/T                      Untimed D      20-May-2023                        Untimed T      11:11                            SM/WB PAXGENE RNA COLLECT D/T                      Untimed D      20-May-2023                        Untimed T      11:11                            SUBJECT FASTED AT LEAST 9 HRS?                      Pt fast 9h     No             RETICULOCYTES                      Retic %        1.3          0.6-2.5 %                      Retic Abs      0.061        0.030-0.120 x106/uL   ANC                      ANC            5.22         1.96-7.23 x103/uL  HBA1C                      Fast HbA1c     7.6     H    <6.5%     Participant ID: [id number]  LP(a) Result:  69.5 nmol/L  Date Resulted:  August 06, 2023  Date of Coaching: December 30,2024   [x]  (Please check once complete) Discussed Lp(a) result with participant:    What is Lipoprotein(a) Lp(a)?  What is a "normal" Lp(a) level, and when should I be concerned?  How is Lp(a) related to "bad cholesterol?"  Does a high Lp(a) level increase my risk of heart disease?  How are Lp(a) levels inherited?  Do Lp(a) levels vary by race or ethnicity?  Diagnosing High Lp(a)  What are the signs of high Lp(a)?  How to get an Lp(a) test?  What Lp(a) results are considered high? How do I get a diagnosis of elevated lipoprotein(a)?  How can I lower my Lp(a)?    During the return of results coaching session, all the topics listed above were reviewed with the participant as per the documents: Guidance for Clinician-Patient Lp(a) Risk Assessment Discussion African American Heart Study and Micro-Learning Session: High Lipoprotein(a) 101.  Participant verbalized understanding of test results and what to do next: to ask their healthcare practitioner to test Lp(a) level, advise first-degree relatives to be screened, and  work to reduce all cardiovascular risk factors within their control, especially LDL cholesterol.

## 2023-05-20 NOTE — Progress Notes (Signed)
This very nice lady is seen today for enrollment in the AA.  The objectives of the trial were explained to the patient who is wishes to proceed.  She previously underwent two vessel stenting and also participated in th AEGIS II trial. She is a patient of Dr. Jacinto Halim.  She has had a total knee replacement recently and is in PT.  She also has IDDM and is followed by Dr. Elvera Lennox.  Otherwise doing ok.  Alert oriented female in NAD Bp144/85 HR 90 R16  T 97.9 Lungs  clear Cor reg R Knee replacement  Ext no edema  Patient stable and enrolled in AA Heart.   Arturo Morton. Riley Kill, MD Medical Director, [facility]

## 2023-05-21 ENCOUNTER — Encounter: Payer: BC Managed Care – PPO | Admitting: Rehabilitative and Restorative Service Providers"

## 2023-05-25 ENCOUNTER — Encounter: Payer: BC Managed Care – PPO | Admitting: Physical Therapy

## 2023-05-25 ENCOUNTER — Ambulatory Visit (INDEPENDENT_AMBULATORY_CARE_PROVIDER_SITE_OTHER): Payer: BC Managed Care – PPO | Admitting: Family Medicine

## 2023-05-25 ENCOUNTER — Encounter: Payer: Self-pay | Admitting: Family Medicine

## 2023-05-25 VITALS — BP 121/76 | HR 68 | Temp 98.1°F | Resp 16 | Wt 158.0 lb

## 2023-05-25 DIAGNOSIS — Z794 Long term (current) use of insulin: Secondary | ICD-10-CM

## 2023-05-25 DIAGNOSIS — I10 Essential (primary) hypertension: Secondary | ICD-10-CM | POA: Diagnosis not present

## 2023-05-25 DIAGNOSIS — W19XXXS Unspecified fall, sequela: Secondary | ICD-10-CM | POA: Diagnosis not present

## 2023-05-25 DIAGNOSIS — E1169 Type 2 diabetes mellitus with other specified complication: Secondary | ICD-10-CM | POA: Diagnosis not present

## 2023-05-25 DIAGNOSIS — Z7984 Long term (current) use of oral hypoglycemic drugs: Secondary | ICD-10-CM

## 2023-05-25 DIAGNOSIS — E785 Hyperlipidemia, unspecified: Secondary | ICD-10-CM

## 2023-05-25 DIAGNOSIS — Z7985 Long-term (current) use of injectable non-insulin antidiabetic drugs: Secondary | ICD-10-CM

## 2023-05-25 MED ORDER — TRESIBA FLEXTOUCH 200 UNIT/ML ~~LOC~~ SOPN: PEN_INJECTOR | SUBCUTANEOUS | 0 refills | Status: DC

## 2023-05-25 MED ORDER — VITAMIN B-12 1000 MCG PO TABS: 1000.0000 ug | ORAL_TABLET | Freq: Every day | ORAL | 0 refills | Status: AC

## 2023-05-25 MED ORDER — HYDROXYZINE HCL 25 MG PO TABS: 25.0000 mg | ORAL_TABLET | Freq: Three times a day (TID) | ORAL | 0 refills | Status: DC | PRN

## 2023-05-25 MED ORDER — EASY TOUCH LANCING DEVICE MISC: 11 refills | Status: AC

## 2023-05-25 MED ORDER — METFORMIN HCL 1000 MG PO TABS: 1000.0000 mg | ORAL_TABLET | Freq: Every day | ORAL | 3 refills | Status: DC

## 2023-05-25 MED ORDER — OZEMPIC (2 MG/DOSE) 8 MG/3ML ~~LOC~~ SOPN: 2.0000 mg | PEN_INJECTOR | SUBCUTANEOUS | 2 refills | Status: DC

## 2023-05-25 MED ORDER — INSULIN PEN NEEDLE 32G X 4 MM MISC: 3 refills | Status: DC

## 2023-05-25 MED ORDER — FARXIGA 10 MG PO TABS: 10.0000 mg | ORAL_TABLET | Freq: Every day | ORAL | 0 refills | Status: DC

## 2023-05-25 MED ORDER — FREESTYLE LIBRE 3 SENSOR MISC: 3 refills | Status: DC

## 2023-05-25 MED ORDER — FIASP FLEXTOUCH 100 UNIT/ML ~~LOC~~ SOPN: 6.0000 [IU] | PEN_INJECTOR | Freq: Three times a day (TID) | SUBCUTANEOUS | 3 refills | Status: DC

## 2023-05-25 MED ORDER — TIZANIDINE HCL 4 MG PO TABS: 4.0000 mg | ORAL_TABLET | Freq: Four times a day (QID) | ORAL | 0 refills | Status: DC | PRN

## 2023-05-25 MED ORDER — GABAPENTIN 600 MG PO TABS: 600.0000 mg | ORAL_TABLET | Freq: Three times a day (TID) | ORAL | 5 refills | Status: DC

## 2023-05-25 MED ORDER — ATORVASTATIN CALCIUM 40 MG PO TABS: 40.0000 mg | ORAL_TABLET | Freq: Every day | ORAL | 3 refills | Status: DC

## 2023-05-25 MED ORDER — METOPROLOL SUCCINATE ER 50 MG PO TB24: ORAL_TABLET | ORAL | 1 refills | Status: DC

## 2023-05-25 MED ORDER — GLUCOSE BLOOD VI STRP: ORAL_STRIP | 11 refills | Status: AC

## 2023-05-25 NOTE — Progress Notes (Signed)
Body aches, arm pain, balance off, urinary concern all since knee surgery

## 2023-05-25 NOTE — Progress Notes (Signed)
Established Patient Office Visit  Subjective    Patient ID: [name], female    DOB: 1961-05-29  Age: 62 y.o. MRN: [id number]  CC: No chief complaint on file.   HPI [name] presents for routine follow up of chronic med issues and reports that she has been having difficulties with balance and falls and this has been most prevalent after recent ortho surgery. 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    Outpatient Encounter Medications as of 05/25/2023  Medication Sig   Ascorbic Acid (VITAMIN C PO) Take 1 tablet by mouth daily.   aspirin 81 MG chewable tablet Chew 1 tablet (81 mg total) by mouth 2 (two) times daily.   HYDROcodone-acetaminophen (NORCO/VICODIN) 5-325 MG tablet Take 1 tablet by mouth every 6 (six) hours as needed for moderate pain.   latanoprost (XALATAN) 0.005 % ophthalmic solution Place 1 drop into both eyes at bedtime.   pantoprazole (PROTONIX) 40 MG tablet Take 1 tablet (40 mg total) by mouth 2 (two) times daily.   [DISCONTINUED] atorvastatin (LIPITOR) 40 MG tablet Take 1 tablet (40 mg total) by mouth daily.   [DISCONTINUED] Continuous Glucose Sensor (FREESTYLE LIBRE 3 SENSOR) MISC APPLY 1 SENSOR EVERY 14 DAYS   [DISCONTINUED] FARXIGA 10 MG TABS tablet Take 10 mg by mouth daily.   [DISCONTINUED] gabapentin (NEURONTIN) 600 MG tablet Take 600 mg by mouth 3 (three) times daily.   [DISCONTINUED] glucose blood (BAYER CONTOUR TEST) test strip TEST BLOOD SUGARS 3 TIMES DAILY   [DISCONTINUED] hydrOXYzine (ATARAX) 25 MG tablet Take 25 mg by mouth 3 (three) times daily as needed. itching   [DISCONTINUED] insulin aspart (FIASP FLEXTOUCH) 100 UNIT/ML FlexTouch Pen Inject 6-20 Units into the skin 3 (three) times daily before meals.   [DISCONTINUED] Insulin Pen Needle 32G X 4 MM MISC Use 4x a day   [DISCONTINUED] Lancet Devices (EASY TOUCH LANCING DEVICE) MISC Use to check diabetes 4 times daily  E 11.65   [DISCONTINUED] metFORMIN (GLUCOPHAGE) 1000 MG tablet  Take 1 tablet (1,000 mg total) by mouth daily with supper.   [DISCONTINUED] OZEMPIC, 2 MG/DOSE, 8 MG/3ML SOPN Inject 2 mg into the skin once a week. Saturday   [DISCONTINUED] tiZANidine (ZANAFLEX) 4 MG tablet Take 1 tablet (4 mg total) by mouth every 6 (six) hours as needed for muscle spasms.   [DISCONTINUED] TRESIBA FLEXTOUCH 200 UNIT/ML FlexTouch Pen INJECT 30 TO 34 UNITS SUBCUTANEOUSLY ONCE DAILY (Patient taking differently: Inject 8-16 Units into the skin See admin instructions. Take 8 units in the morning and at lunch and 16 units at bedtime)   [DISCONTINUED] vitamin B-12 (CYANOCOBALAMIN) 1000 MCG tablet Take 1,000 mcg by mouth daily.   atorvastatin (LIPITOR) 40 MG tablet Take 1 tablet (40 mg total) by mouth daily.   Continuous Glucose Sensor (FREESTYLE LIBRE 3 SENSOR) MISC APPLY 1 SENSOR EVERY 14 DAYS   cyanocobalamin (VITAMIN B12) 1000 MCG tablet Take 1 tablet (1,000 mcg total) by mouth daily.   FARXIGA 10 MG TABS tablet Take 1 tablet (10 mg total) by mouth daily.   gabapentin (NEURONTIN) 600 MG tablet Take 1 tablet (600 mg total) by mouth 3 (three) times daily.   glucose blood (BAYER CONTOUR TEST) test strip TEST BLOOD SUGARS 3 TIMES DAILY   hydrOXYzine (ATARAX) 25 MG tablet Take 1 tablet (25 mg total) by mouth 3 (three) times daily as needed. itching   insulin aspart (FIASP FLEXTOUCH) 100 UNIT/ML FlexTouch Pen Inject 6-20 Units into the skin 3 (three) times  daily before meals.   insulin degludec (TRESIBA FLEXTOUCH) 200 UNIT/ML FlexTouch Pen INJECT 30 TO 34 UNITS SUBCUTANEOUSLY ONCE DAILY Strength: 200 UNIT/ML   Insulin Pen Needle 32G X 4 MM MISC Use 4x a day   Lancet Devices (EASY TOUCH LANCING DEVICE) MISC Use to check diabetes 4 times daily  E 11.65   lidocaine (XYLOCAINE) 5 % ointment Apply 1 application. topically as needed. (Patient not taking: Reported on 05/25/2023)   metFORMIN (GLUCOPHAGE) 1000 MG tablet Take 1 tablet (1,000 mg total) by mouth daily with supper.   metoprolol  succinate (TOPROL-XL) 50 MG 24 hr tablet TAKE 1 TABLET BY MOUTH ONCE DAILY. TAKE  WITH  OR  IMMEDIATELY  FOLLOWING  A  MEAL   naloxone (NARCAN) nasal spray 4 mg/0.1 mL Place 1 spray into the nose as needed (or).   ondansetron (ZOFRAN-ODT) 8 MG disintegrating tablet Take 1 tablet (8 mg total) by mouth every 8 (eight) hours as needed for nausea or vomiting.   OZEMPIC, 2 MG/DOSE, 8 MG/3ML SOPN Inject 2 mg into the skin once a week. Saturday   solifenacin (VESICARE) 5 MG tablet Take 5 mg by mouth daily.   tiZANidine (ZANAFLEX) 4 MG tablet Take 1 tablet (4 mg total) by mouth every 6 (six) hours as needed for muscle spasms.   triamcinolone cream (KENALOG) 0.1 % Apply 1 Application topically 3 (three) times daily as needed (itching).   [DISCONTINUED] metoprolol succinate (TOPROL-XL) 50 MG 24 hr tablet TAKE 1 TABLET BY MOUTH ONCE DAILY. TAKE  WITH  OR  IMMEDIATELY  FOLLOWING  A  MEAL   No facility-administered encounter medications on file as of 05/25/2023.    Past Medical History:  Diagnosis Date   Arthritis    Campylobacter enteritis May 2012   [facility] admission   Carpal tunnel syndrome, bilateral    Diabetes mellitus    Gastritis with bleeding due to alcohol 2010   [facility] admission   GERD (gastroesophageal reflux disease)    Hyperlipidemia    Hypertension    no meds   Menopause    Myocardial infarction [facility])    Neck pain    Wears glasses     Past Surgical History:  Procedure Laterality Date   CARPAL TUNNEL RELEASE Left 05/14/2014   Procedure: LEFT ULNAR NEUROPLASTY AT ELBOW AND ENDOSCOPIC CARPAL TUNNEL RELEASE;  Surgeon: Jodi Marble, MD;  Location: [facility];  Service: Orthopedics;  Laterality: Left;   CORONARY STENT INTERVENTION N/A 10/09/2019   Procedure: CORONARY STENT INTERVENTION;  Surgeon: Yates Decamp, MD;  Location: [facility];  Service: Cardiovascular;  Laterality: N/A;   CYST EXCISION  1995   under arms   endometrial biopsy  2004   benign   FOOT  OSTEOTOMY  2010   right-spurs   LEFT HEART CATH AND CORONARY ANGIOGRAPHY N/A 10/09/2019   Procedure: LEFT HEART CATH AND CORONARY ANGIOGRAPHY;  Surgeon: Yates Decamp, MD;  Location: [facility];  Service: Cardiovascular;  Laterality: N/A;   TONSILLECTOMY     TOTAL KNEE ARTHROPLASTY Left 02/26/2023   Procedure: LEFT TOTAL KNEE ARTHROPLASTY;  Surgeon: Kathryne Hitch, MD;  Location: [facility];  Service: Orthopedics;  Laterality: Left;  Needs RNFA   ULNAR NERVE TRANSPOSITION Left 05/14/2014   Procedure: ULNAR NERVE DECOMPRESSION/TRANSPOSITION;  Surgeon: Jodi Marble, MD;  Location:  SURGERY CENTER;  Service: Orthopedics;  Laterality: Left;    Family History  Problem Relation Age of Onset   Hypertension Mother  Diabetes Mother    Heart attack Mother    Stroke Father    Diabetes Father    Diabetes Maternal Grandmother    Hypertension Maternal Grandmother    Heart attack Sister     Social History   Socioeconomic History   Marital status: Single    Spouse name: Not on file   Number of children: 2   Years of education: 12   Highest education level: High school graduate  Occupational History   Not on file  Tobacco Use   Smoking status: Never   Smokeless tobacco: Never  Vaping Use   Vaping Use: Never used  Substance and Sexual Activity   Alcohol use: Not Currently   Drug use: Yes    Types: Marijuana    Comment: sometimes   Sexual activity: Yes    Birth control/protection: None    Comment: same partner x 10 years  Other Topics Concern   Not on file  Social History Narrative   She works as a Arboriculturist.   Right-handed.   One cup caffeine per day.   She lives at home with daughter.   Social Determinants of Health   Financial Resource Strain: Not on file  Food Insecurity: No Food Insecurity (02/26/2023)   Hunger Vital Sign    Worried About Running Out of Food in the Last Year: Never true    Ran Out of Food in the Last Year: Never true   Transportation Needs: No Transportation Needs (02/26/2023)   PRAPARE - Administrator, Civil Service (Medical): No    Lack of Transportation (Non-Medical): No  Physical Activity: Not on file  Stress: Not on file  Social Connections: Not on file  Intimate Partner Violence: Not At Risk (02/26/2023)   Humiliation, Afraid, Rape, and Kick questionnaire    Fear of Current or Ex-Partner: No    Emotionally Abused: No    Physically Abused: No    Sexually Abused: No    Review of Systems  Musculoskeletal:  Positive for falls.  All other systems reviewed and are negative.       Objective    BP 121/76   Pulse 68   Temp 98.1 F (36.7 C) (Oral)   Resp 16   Wt 158 lb (71.7 kg)   SpO2 94%   BMI 27.12 kg/m   Physical Exam Vitals and nursing note reviewed.  Constitutional:      General: She is not in acute distress. Cardiovascular:     Rate and Rhythm: Normal rate and regular rhythm.  Pulmonary:     Effort: Pulmonary effort is normal.     Breath sounds: Normal breath sounds.  Abdominal:     Palpations: Abdomen is soft.     Tenderness: There is no abdominal tenderness.  Musculoskeletal:     Comments: Utilizing aid for stability  Neurological:     General: No focal deficit present.     Mental Status: She is alert and oriented to person, place, and time.         Assessment & Plan:   1. Type 2 diabetes mellitus with other specified complication, with long-term current use of insulin (HCC) Continue present management  2. Essential hypertension Appears stable. continue  3. Hyperlipidemia associated with type 2 diabetes mellitus (HCC) Continue   4. Fall, sequela Patient to follow up with ortho for further eval/mgt   No follow-ups on file.   Tommie Raymond, MD

## 2023-05-27 ENCOUNTER — Encounter: Payer: Self-pay | Admitting: Physician Assistant

## 2023-05-27 ENCOUNTER — Ambulatory Visit: Payer: BC Managed Care – PPO | Admitting: Physician Assistant

## 2023-05-27 ENCOUNTER — Other Ambulatory Visit (INDEPENDENT_AMBULATORY_CARE_PROVIDER_SITE_OTHER): Payer: BC Managed Care – PPO

## 2023-05-27 DIAGNOSIS — Z96652 Presence of left artificial knee joint: Secondary | ICD-10-CM

## 2023-05-27 DIAGNOSIS — R269 Unspecified abnormalities of gait and mobility: Secondary | ICD-10-CM

## 2023-05-27 DIAGNOSIS — T8482XD Fibrosis due to internal orthopedic prosthetic devices, implants and grafts, subsequent encounter: Secondary | ICD-10-CM

## 2023-05-27 NOTE — Progress Notes (Signed)
HPI: [name] comes in today for follow-up left total knee arthroplasty 02/17/2023.  She underwent left knee manipulation under anesthesia in early May.  Intraoperatively she was brought back to almost full flexion.  She comes in today due to the fact that she continues to have pain in the knee.  She is still walking with a cane or walker at times.  She presents today on a cane.  She notes some dizziness and lightheadedness of unknown origin.  She states she is talked to her primary care provider about this.  Review of systems: See HPI otherwise negative or noncontributory   Physical exam: General well-developed well-nourished female who is able to ambulate with a slight antalgic gait and the use of a cane. Bilateral knees: Right knee overall good range of motion.  Left knee she has full extension flexion to 90 degrees no instability valgus varus stressing no abnormal warmth erythema.  Radiographs: No acute fracture.  Total arthroplasty components well-seated.  Knee is well located.  No bony abnormalities.  Impression: Gait balance disturbance. Status post right total knee arthroplasty Arthrofibrosis left knee   Plan: We will send her to formal physical therapy to work on range of motion of the left knee also work on Research officer, political party.  Follow-up with [name] in 4 weeks.  She will remain out of work at this point time.  Reevaluate her work status in 4 weeks

## 2023-05-28 ENCOUNTER — Encounter: Payer: Self-pay | Admitting: Family Medicine

## 2023-05-28 ENCOUNTER — Other Ambulatory Visit: Payer: Self-pay

## 2023-05-28 ENCOUNTER — Encounter: Payer: BC Managed Care – PPO | Admitting: Physical Therapy

## 2023-05-28 DIAGNOSIS — Z96652 Presence of left artificial knee joint: Secondary | ICD-10-CM

## 2023-05-31 ENCOUNTER — Encounter (HOSPITAL_COMMUNITY): Payer: Self-pay | Admitting: Radiology

## 2023-05-31 ENCOUNTER — Emergency Department (HOSPITAL_COMMUNITY): Payer: BC Managed Care – PPO

## 2023-05-31 ENCOUNTER — Inpatient Hospital Stay (HOSPITAL_COMMUNITY)
Admission: EM | Admit: 2023-05-31 | Discharge: 2023-06-04 | DRG: 074 | Disposition: A | Discharge status: Home / Self Care | Payer: BC Managed Care – PPO | Attending: Internal Medicine | Admitting: Internal Medicine

## 2023-05-31 ENCOUNTER — Other Ambulatory Visit: Payer: Self-pay

## 2023-05-31 DIAGNOSIS — E871 Hypo-osmolality and hyponatremia: Secondary | ICD-10-CM | POA: Diagnosis present

## 2023-05-31 DIAGNOSIS — K219 Gastro-esophageal reflux disease without esophagitis: Secondary | ICD-10-CM | POA: Diagnosis present

## 2023-05-31 DIAGNOSIS — I252 Old myocardial infarction: Secondary | ICD-10-CM

## 2023-05-31 DIAGNOSIS — F1290 Cannabis use, unspecified, uncomplicated: Secondary | ICD-10-CM | POA: Diagnosis present

## 2023-05-31 DIAGNOSIS — Z79899 Other long term (current) drug therapy: Secondary | ICD-10-CM

## 2023-05-31 DIAGNOSIS — R112 Nausea with vomiting, unspecified: Principal | ICD-10-CM | POA: Diagnosis present

## 2023-05-31 DIAGNOSIS — Z96652 Presence of left artificial knee joint: Secondary | ICD-10-CM | POA: Diagnosis present

## 2023-05-31 DIAGNOSIS — E8729 Other acidosis: Secondary | ICD-10-CM | POA: Diagnosis present

## 2023-05-31 DIAGNOSIS — T383X5A Adverse effect of insulin and oral hypoglycemic [antidiabetic] drugs, initial encounter: Secondary | ICD-10-CM | POA: Diagnosis present

## 2023-05-31 DIAGNOSIS — K3184 Gastroparesis: Secondary | ICD-10-CM | POA: Diagnosis present

## 2023-05-31 DIAGNOSIS — E876 Hypokalemia: Secondary | ICD-10-CM | POA: Diagnosis present

## 2023-05-31 DIAGNOSIS — Z823 Family history of stroke: Secondary | ICD-10-CM

## 2023-05-31 DIAGNOSIS — E119 Type 2 diabetes mellitus without complications: Secondary | ICD-10-CM

## 2023-05-31 DIAGNOSIS — N19 Unspecified kidney failure: Secondary | ICD-10-CM | POA: Diagnosis present

## 2023-05-31 DIAGNOSIS — E872 Acidosis, unspecified: Secondary | ICD-10-CM | POA: Diagnosis present

## 2023-05-31 DIAGNOSIS — E782 Mixed hyperlipidemia: Secondary | ICD-10-CM

## 2023-05-31 DIAGNOSIS — Z833 Family history of diabetes mellitus: Secondary | ICD-10-CM

## 2023-05-31 DIAGNOSIS — K5900 Constipation, unspecified: Secondary | ICD-10-CM | POA: Diagnosis present

## 2023-05-31 DIAGNOSIS — E1143 Type 2 diabetes mellitus with diabetic autonomic (poly)neuropathy: Principal | ICD-10-CM | POA: Diagnosis present

## 2023-05-31 DIAGNOSIS — E785 Hyperlipidemia, unspecified: Secondary | ICD-10-CM | POA: Diagnosis present

## 2023-05-31 DIAGNOSIS — Z7985 Long-term (current) use of injectable non-insulin antidiabetic drugs: Secondary | ICD-10-CM

## 2023-05-31 DIAGNOSIS — E1165 Type 2 diabetes mellitus with hyperglycemia: Secondary | ICD-10-CM

## 2023-05-31 DIAGNOSIS — Z8249 Family history of ischemic heart disease and other diseases of the circulatory system: Secondary | ICD-10-CM

## 2023-05-31 DIAGNOSIS — Z7982 Long term (current) use of aspirin: Secondary | ICD-10-CM

## 2023-05-31 DIAGNOSIS — R1084 Generalized abdominal pain: Secondary | ICD-10-CM | POA: Diagnosis not present

## 2023-05-31 DIAGNOSIS — Z7984 Long term (current) use of oral hypoglycemic drugs: Secondary | ICD-10-CM

## 2023-05-31 DIAGNOSIS — R6510 Systemic inflammatory response syndrome (SIRS) of non-infectious origin without acute organ dysfunction: Secondary | ICD-10-CM | POA: Diagnosis present

## 2023-05-31 DIAGNOSIS — E1142 Type 2 diabetes mellitus with diabetic polyneuropathy: Secondary | ICD-10-CM | POA: Diagnosis present

## 2023-05-31 DIAGNOSIS — Z955 Presence of coronary angioplasty implant and graft: Secondary | ICD-10-CM

## 2023-05-31 DIAGNOSIS — I10 Essential (primary) hypertension: Secondary | ICD-10-CM | POA: Diagnosis not present

## 2023-05-31 DIAGNOSIS — E1169 Type 2 diabetes mellitus with other specified complication: Secondary | ICD-10-CM | POA: Diagnosis present

## 2023-05-31 DIAGNOSIS — E878 Other disorders of electrolyte and fluid balance, not elsewhere classified: Secondary | ICD-10-CM | POA: Diagnosis present

## 2023-05-31 DIAGNOSIS — E860 Dehydration: Secondary | ICD-10-CM | POA: Diagnosis present

## 2023-05-31 DIAGNOSIS — R109 Unspecified abdominal pain: Secondary | ICD-10-CM | POA: Diagnosis present

## 2023-05-31 DIAGNOSIS — R030 Elevated blood-pressure reading, without diagnosis of hypertension: Secondary | ICD-10-CM

## 2023-05-31 DIAGNOSIS — K529 Noninfective gastroenteritis and colitis, unspecified: Secondary | ICD-10-CM

## 2023-05-31 DIAGNOSIS — Z794 Long term (current) use of insulin: Secondary | ICD-10-CM

## 2023-05-31 LAB — COMPREHENSIVE METABOLIC PANEL
ALT: 25 U/L (ref 0–44)
AST: 23 U/L (ref 15–41)
Albumin: 4.6 g/dL (ref 3.5–5.0)
Alkaline Phosphatase: 106 U/L (ref 38–126)
Anion gap: 18 — ABNORMAL HIGH (ref 5–15)
BUN: 15 mg/dL (ref 8–23)
CO2: 21 mmol/L — ABNORMAL LOW (ref 22–32)
Calcium: 9.8 mg/dL (ref 8.9–10.3)
Chloride: 101 mmol/L (ref 98–111)
Creatinine, Ser: 0.64 mg/dL (ref 0.44–1.00)
GFR, Estimated: >60 mL/min (ref >60)
Glucose, Bld: 180 mg/dL — ABNORMAL HIGH (ref 70–99)
Potassium: 3.4 mmol/L — ABNORMAL LOW (ref 3.5–5.1)
Sodium: 140 mmol/L (ref 135–145)
Total Bilirubin: 1 mg/dL (ref 0.3–1.2)
Total Protein: 8.6 g/dL — ABNORMAL HIGH (ref 6.5–8.1)

## 2023-05-31 LAB — URINALYSIS, ROUTINE W REFLEX MICROSCOPIC
Bilirubin Urine: NEGATIVE
Glucose, UA: >=500 mg/dL — AB
Hgb urine dipstick: NEGATIVE
Ketones, ur: 80 mg/dL — AB
Leukocytes,Ua: NEGATIVE
Nitrite: NEGATIVE
Protein, ur: 100 mg/dL — AB
Specific Gravity, Urine: 1.036 — ABNORMAL HIGH (ref 1.005–1.030)
pH: 5 (ref 5.0–8.0)

## 2023-05-31 LAB — RAPID URINE DRUG SCREEN, HOSP PERFORMED
Amphetamines: NOT DETECTED
Barbiturates: NOT DETECTED
Benzodiazepines: NOT DETECTED
Cocaine: NOT DETECTED
Opiates: NOT DETECTED
Tetrahydrocannabinol: POSITIVE — AB

## 2023-05-31 LAB — MAGNESIUM: Magnesium: 1.9 mg/dL (ref 1.7–2.4)

## 2023-05-31 LAB — CBC
HCT: 50.7 % — ABNORMAL HIGH (ref 36.0–46.0)
Hemoglobin: 15.8 g/dL — ABNORMAL HIGH (ref 12.0–15.0)
MCH: 27.3 pg (ref 26.0–34.0)
MCHC: 31.2 g/dL (ref 30.0–36.0)
MCV: 87.7 fL (ref 80.0–100.0)
Platelets: 418 10*3/uL — ABNORMAL HIGH (ref 150–400)
RBC: 5.78 MIL/uL — ABNORMAL HIGH (ref 3.87–5.11)
RDW: 13.9 % (ref 11.5–15.5)
WBC: 15 10*3/uL — ABNORMAL HIGH (ref 4.0–10.5)
nRBC: 0 % (ref 0.0–0.2)

## 2023-05-31 LAB — LIPASE, BLOOD: Lipase: 48 U/L (ref 11–51)

## 2023-05-31 LAB — TROPONIN I (HIGH SENSITIVITY): Troponin I (High Sensitivity): 18 ng/L — ABNORMAL HIGH (ref <18)

## 2023-05-31 MED ORDER — ACETAMINOPHEN 650 MG RE SUPP: 650.0000 mg | Freq: Four times a day (QID) | RECTAL | Status: DC | PRN

## 2023-05-31 MED ORDER — ACETAMINOPHEN 325 MG PO TABS: 650.0000 mg | ORAL_TABLET | Freq: Four times a day (QID) | ORAL | Status: DC | PRN

## 2023-05-31 MED ORDER — ALUM & MAG HYDROXIDE-SIMETH 200-200-20 MG/5ML PO SUSP
30.0000 mL | Freq: Once | ORAL | Status: AC
  Administered 2023-05-31: 30 mL via ORAL
  Filled 2023-05-31: qty 30

## 2023-05-31 MED ORDER — LORAZEPAM 2 MG/ML IJ SOLN
1.0000 mg | INTRAMUSCULAR | Status: DC | PRN
  Administered 2023-05-31 – 2023-06-01 (×3): 1 mg via INTRAVENOUS
  Filled 2023-05-31 (×3): qty 1

## 2023-05-31 MED ORDER — SODIUM CHLORIDE (PF) 0.9 % IJ SOLN
INTRAMUSCULAR | Status: AC
  Filled 2023-05-31: qty 50

## 2023-05-31 MED ORDER — POTASSIUM CHLORIDE 10 MEQ/100ML IV SOLN
10.0000 meq | INTRAVENOUS | Status: AC
  Administered 2023-05-31 – 2023-06-01 (×4): 10 meq via INTRAVENOUS
  Filled 2023-05-31 (×4): qty 100

## 2023-05-31 MED ORDER — CIPROFLOXACIN HCL 500 MG PO TABS: 500.0000 mg | ORAL_TABLET | Freq: Two times a day (BID) | ORAL | 0 refills | Status: DC

## 2023-05-31 MED ORDER — LACTATED RINGERS IV SOLN: INTRAVENOUS | Status: AC

## 2023-05-31 MED ORDER — METRONIDAZOLE 500 MG/100ML IV SOLN
500.0000 mg | Freq: Once | INTRAVENOUS | Status: AC
  Administered 2023-05-31: 500 mg via INTRAVENOUS
  Filled 2023-05-31: qty 100

## 2023-05-31 MED ORDER — POLYETHYLENE GLYCOL 3350 17 G PO PACK
17.0000 g | PACK | Freq: Every day | ORAL | Status: DC | PRN
  Administered 2023-05-31 – 2023-06-03 (×2): 17 g via ORAL
  Filled 2023-05-31 (×2): qty 1

## 2023-05-31 MED ORDER — HYDROMORPHONE HCL 1 MG/ML IJ SOLN
0.5000 mg | Freq: Once | INTRAMUSCULAR | Status: AC
  Administered 2023-05-31: 0.5 mg via INTRAVENOUS
  Filled 2023-05-31: qty 1

## 2023-05-31 MED ORDER — CIPROFLOXACIN IN D5W 400 MG/200ML IV SOLN
400.0000 mg | Freq: Once | INTRAVENOUS | Status: AC
  Administered 2023-05-31: 400 mg via INTRAVENOUS
  Filled 2023-05-31: qty 200

## 2023-05-31 MED ORDER — METOPROLOL TARTRATE 25 MG PO TABS
50.0000 mg | ORAL_TABLET | Freq: Once | ORAL | Status: AC
  Administered 2023-05-31: 50 mg via ORAL
  Filled 2023-05-31: qty 2

## 2023-05-31 MED ORDER — LACTATED RINGERS IV BOLUS
1000.0000 mL | Freq: Once | INTRAVENOUS | Status: AC
  Administered 2023-05-31: 1000 mL via INTRAVENOUS

## 2023-05-31 MED ORDER — PANTOPRAZOLE SODIUM 40 MG IV SOLR
40.0000 mg | Freq: Two times a day (BID) | INTRAVENOUS | Status: DC
  Administered 2023-06-01 – 2023-06-03 (×5): 40 mg via INTRAVENOUS
  Filled 2023-05-31 (×6): qty 10

## 2023-05-31 MED ORDER — IOHEXOL 300 MG/ML  SOLN
100.0000 mL | Freq: Once | INTRAMUSCULAR | Status: AC | PRN
  Administered 2023-05-31: 100 mL via INTRAVENOUS

## 2023-05-31 MED ORDER — ONDANSETRON HCL 4 MG/2ML IJ SOLN
4.0000 mg | Freq: Once | INTRAMUSCULAR | Status: AC
  Administered 2023-05-31: 4 mg via INTRAVENOUS
  Filled 2023-05-31: qty 2

## 2023-05-31 MED ORDER — METRONIDAZOLE 500 MG PO TABS: 500.0000 mg | ORAL_TABLET | Freq: Three times a day (TID) | ORAL | 0 refills | Status: DC

## 2023-05-31 MED ORDER — LACTATED RINGERS IV BOLUS
500.0000 mL | Freq: Once | INTRAVENOUS | Status: AC
  Administered 2023-05-31: 500 mL via INTRAVENOUS

## 2023-05-31 MED ORDER — MELATONIN 3 MG PO TABS
3.0000 mg | ORAL_TABLET | Freq: Every evening | ORAL | Status: DC | PRN
  Filled 2023-05-31: qty 1

## 2023-05-31 MED ORDER — HYDROMORPHONE HCL 1 MG/ML IJ SOLN
0.5000 mg | INTRAMUSCULAR | Status: DC | PRN
  Administered 2023-05-31 – 2023-06-03 (×8): 0.5 mg via INTRAVENOUS
  Filled 2023-05-31 (×8): qty 0.5

## 2023-05-31 MED ORDER — FAMOTIDINE 20 MG PO TABS
20.0000 mg | ORAL_TABLET | Freq: Once | ORAL | Status: AC
  Administered 2023-05-31: 20 mg via ORAL
  Filled 2023-05-31: qty 1

## 2023-05-31 MED ORDER — PANTOPRAZOLE SODIUM 40 MG IV SOLR
40.0000 mg | Freq: Once | INTRAVENOUS | Status: AC
  Administered 2023-05-31: 40 mg via INTRAVENOUS
  Filled 2023-05-31: qty 10

## 2023-05-31 MED ORDER — ONDANSETRON HCL 4 MG/2ML IJ SOLN
4.0000 mg | Freq: Four times a day (QID) | INTRAMUSCULAR | Status: DC | PRN
  Administered 2023-05-31 – 2023-06-03 (×6): 4 mg via INTRAVENOUS
  Filled 2023-05-31 (×6): qty 2

## 2023-05-31 MED ORDER — NALOXONE HCL 0.4 MG/ML IJ SOLN: 0.4000 mg | INTRAMUSCULAR | Status: DC | PRN

## 2023-05-31 MED ORDER — ACETAMINOPHEN 500 MG PO TABS
1000.0000 mg | ORAL_TABLET | Freq: Once | ORAL | Status: AC
  Administered 2023-05-31: 1000 mg via ORAL
  Filled 2023-05-31: qty 2

## 2023-05-31 MED ORDER — LABETALOL HCL 5 MG/ML IV SOLN: 10.0000 mg | INTRAVENOUS | Status: DC | PRN

## 2023-05-31 MED ORDER — ONDANSETRON 8 MG PO TBDP: 8.0000 mg | ORAL_TABLET | Freq: Three times a day (TID) | ORAL | 0 refills | Status: AC | PRN

## 2023-05-31 NOTE — Discharge Instructions (Addendum)
It was our pleasure to provide your ER care today - we hope that you feel better.  Drink plenty of fluids/stay well hydrated. Take zofran as need for nausea. Take acetaminophen, pepcid or maalox as need for symptom relief.   Your blood pressure is high today - take your meds as prescribed, limit salt intake, and follow up closely with primary care doctor in 1-2 weeks.   Note that increasingly we are seeing a recurrent abdominal pain and/or vomiting syndrome called Cannabinoid Hyperemesis Syndrome - see attached info - in these cases avoiding marijuana use will prevent symptoms from recurrent (note that symptoms can persist for a few weeks if history of heavy marijuana use as it can take time to get out of system).   Return to ER right away  if worse, fevers, new symptoms, new or worsening or severe abdominal pain, persistent vomiting, chest pain, trouble breathing, or other emergency concern.  You were given pain meds in the ER - no driving for the next 6 hours.

## 2023-05-31 NOTE — H&P (Signed)
History and Physical      [name] ZOX:[id number] DOB: [birth year] DOA: 05/31/2023; DOS: 05/31/2023  PCP: Georganna Skeans, MD *** Patient coming from: home ***  I have personally briefly reviewed patient's old medical records in [facility]  Chief Complaint: ***  HPI: [name] is a 62 y.o. female with medical history significant for *** who is admitted to [facility] on 05/31/2023 with *** after presenting from home*** to [facility] ED complaining of ***.   ***        ***  ED Course:  Vital signs in the ED were notable for the following: ***  Labs were notable for the following: ***  Per my interpretation, EKG in ED demonstrated the following:  ***  Imaging in the ED, per corresponding formal radiology read, was notable for the following: ***  While in the ED, the following were administered: ***  Subsequently, the patient was admitted  ***  ***red   Review of Systems: As per HPI otherwise 10 point review of systems negative.   Past Medical History:  Diagnosis Date   Arthritis    Campylobacter enteritis May 2012   [facility] admission   Carpal tunnel syndrome, bilateral    Diabetes mellitus    Gastritis with bleeding due to alcohol 2010   [facility] admission   GERD (gastroesophageal reflux disease)    Hyperlipidemia    Hypertension    no meds   Menopause    Myocardial infarction [facility])    Neck pain    Wears glasses     Past Surgical History:  Procedure Laterality Date   CARPAL TUNNEL RELEASE Left 05/14/2014   Procedure: LEFT ULNAR NEUROPLASTY AT ELBOW AND ENDOSCOPIC CARPAL TUNNEL RELEASE;  Surgeon: Jodi Marble, MD;  Location: [facility];  Service: Orthopedics;  Laterality: Left;   CORONARY STENT INTERVENTION N/A 10/09/2019   Procedure: CORONARY STENT INTERVENTION;  Surgeon: Yates Decamp, MD;  Location: [facility] INVASIVE CV [facility];  Service: Cardiovascular;  Laterality: N/A;   CYST EXCISION  1995   under arms   endometrial biopsy  2004    benign   FOOT OSTEOTOMY  2010   right-spurs   LEFT HEART CATH AND CORONARY ANGIOGRAPHY N/A 10/09/2019   Procedure: LEFT HEART CATH AND CORONARY ANGIOGRAPHY;  Surgeon: Yates Decamp, MD;  Location: [facility] INVASIVE CV LAB;  Service: Cardiovascular;  Laterality: N/A;   TONSILLECTOMY     TOTAL KNEE ARTHROPLASTY Left 02/26/2023   Procedure: LEFT TOTAL KNEE ARTHROPLASTY;  Surgeon: Kathryne Hitch, MD;  Location: [facility];  Service: Orthopedics;  Laterality: Left;  Needs RNFA   ULNAR NERVE TRANSPOSITION Left 05/14/2014   Procedure: ULNAR NERVE DECOMPRESSION/TRANSPOSITION;  Surgeon: Jodi Marble, MD;  Location: [facility];  Service: Orthopedics;  Laterality: Left;    Social History:  reports that she has never smoked. She has never used smokeless tobacco. She reports that she does not currently use alcohol. She reports current drug use. Drug: Marijuana.   Allergies  Allergen Reactions   Oxycodone-Acetaminophen Other (See Comments)    Causes her to feel "hot".   Penicillins Rash    Did it involve swelling of the face/tongue/throat, SOB, or low BP? No Did it involve sudden or severe rash/hives, skin peeling, or any reaction on the inside of your mouth or nose? No Did you need to seek medical attention at a hospital or doctor's office? No When did it last happen?       If all  above answers are "NO", may proceed with cephalosporin use.    Family History  Problem Relation Age of Onset   Hypertension Mother    Diabetes Mother    Heart attack Mother    Stroke Father    Diabetes Father    Diabetes Maternal Grandmother    Hypertension Maternal Grandmother    Heart attack Sister     Family history reviewed and not pertinent ***   Prior to Admission medications   Medication Sig Start Date End Date Taking? Authorizing Provider  ciprofloxacin (CIPRO) 500 MG tablet Take 1 tablet (500 mg total) by mouth 2 (two) times daily. 05/31/23  Yes Cathren Laine, MD  metroNIDAZOLE (FLAGYL)  500 MG tablet Take 1 tablet (500 mg total) by mouth 3 (three) times daily. 05/31/23  Yes Cathren Laine, MD  ondansetron (ZOFRAN-ODT) 8 MG disintegrating tablet Take 1 tablet (8 mg total) by mouth every 8 (eight) hours as needed for nausea or vomiting. 05/31/23  Yes Cathren Laine, MD  Ascorbic Acid (VITAMIN C PO) Take 1 tablet by mouth daily.    [provider]  aspirin 81 MG chewable tablet Chew 1 tablet (81 mg total) by mouth 2 (two) times daily. 02/27/23   Kathryne Hitch, MD  atorvastatin (LIPITOR) 40 MG tablet Take 1 tablet (40 mg total) by mouth daily. 05/25/23   Georganna Skeans, MD  Continuous Glucose Sensor (FREESTYLE LIBRE 3 SENSOR) MISC APPLY 1 SENSOR EVERY 14 DAYS 05/25/23   Georganna Skeans, MD  cyanocobalamin (VITAMIN B12) 1000 MCG tablet Take 1 tablet (1,000 mcg total) by mouth daily. 05/25/23   Georganna Skeans, MD  FARXIGA 10 MG TABS tablet Take 1 tablet (10 mg total) by mouth daily. 05/25/23   Georganna Skeans, MD  gabapentin (NEURONTIN) 600 MG tablet Take 1 tablet (600 mg total) by mouth 3 (three) times daily. 05/25/23   Georganna Skeans, MD  glucose blood (BAYER CONTOUR TEST) test strip TEST BLOOD SUGARS 3 TIMES DAILY 05/25/23   Georganna Skeans, MD  HYDROcodone-acetaminophen (NORCO/VICODIN) 5-325 MG tablet Take 1 tablet by mouth every 6 (six) hours as needed for moderate pain. 04/29/23   Kathryne Hitch, MD  hydrOXYzine (ATARAX) 25 MG tablet Take 1 tablet (25 mg total) by mouth 3 (three) times daily as needed. itching 05/25/23   Georganna Skeans, MD  insulin aspart (FIASP FLEXTOUCH) 100 UNIT/ML FlexTouch Pen Inject 6-20 Units into the skin 3 (three) times daily before meals. 05/25/23   Georganna Skeans, MD  insulin degludec (TRESIBA FLEXTOUCH) 200 UNIT/ML FlexTouch Pen INJECT 30 TO 34 UNITS SUBCUTANEOUSLY ONCE DAILY Strength: 200 UNIT/ML 05/25/23   Georganna Skeans, MD  Insulin Pen Needle 32G X 4 MM MISC Use 4x a day 05/25/23   Georganna Skeans, MD  Lancet Devices (EASY TOUCH LANCING DEVICE)  MISC Use to check diabetes 4 times daily  E 11.65 05/25/23   Georganna Skeans, MD  latanoprost (XALATAN) 0.005 % ophthalmic solution Place 1 drop into both eyes at bedtime. 12/02/18   [provider]  lidocaine (XYLOCAINE) 5 % ointment Apply 1 application. topically as needed. Patient not taking: Reported on 05/25/2023 04/20/22   Merrilee Jansky, MD  metFORMIN (GLUCOPHAGE) 1000 MG tablet Take 1 tablet (1,000 mg total) by mouth daily with supper. 05/25/23   Georganna Skeans, MD  metoprolol succinate (TOPROL-XL) 50 MG 24 hr tablet TAKE 1 TABLET BY MOUTH ONCE DAILY. TAKE  WITH  OR  IMMEDIATELY  FOLLOWING  A  MEAL 05/25/23   Georganna Skeans, MD  naloxone (  NARCAN) nasal spray 4 mg/0.1 mL Place 1 spray into the nose as needed (or). 12/09/20   [provider]  OZEMPIC, 2 MG/DOSE, 8 MG/3ML SOPN Inject 2 mg into the skin once a week. Saturday 05/25/23   Georganna Skeans, MD  pantoprazole (PROTONIX) 40 MG tablet Take 1 tablet (40 mg total) by mouth 2 (two) times daily. 07/06/22   Osvaldo Shipper, MD  solifenacin (VESICARE) 5 MG tablet Take 5 mg by mouth daily. 05/29/22   [provider]  tiZANidine (ZANAFLEX) 4 MG tablet Take 1 tablet (4 mg total) by mouth every 6 (six) hours as needed for muscle spasms. 05/25/23   Georganna Skeans, MD  triamcinolone cream (KENALOG) 0.1 % Apply 1 Application topically 3 (three) times daily as needed (itching).    [provider]     Objective    Physical Exam: Vitals:   05/31/23 1723 05/31/23 1900 05/31/23 1930 05/31/23 2000  BP: (!) 176/101 (!) 145/89 129/84 128/78  Pulse: (!) 103 (!) 106 (!) 106 (!) 104  Resp: 17 17    Temp: 98.9 F (37.2 C)     TempSrc: Oral     SpO2: 97% 98% 97% 97%  Weight:      Height:        General: appears to be stated age; alert, oriented Skin: warm, dry, no rash Head:  AT/[place] Mouth:  Oral mucosa membranes appear moist, normal dentition Neck: supple; trachea midline Heart:  RRR; did not appreciate any M/R/G Lungs:  CTAB, did not appreciate any wheezes, rales, or rhonchi Abdomen: + BS; soft, ND, NT Vascular: 2+ pedal pulses b/l; 2+ radial pulses b/l Extremities: no peripheral edema, no muscle wasting Neuro: strength and sensation intact in upper and lower extremities b/l    *** Neuro: 5/5 strength of the proximal and distal flexors and extensors of the upper and lower extremities bilaterally; sensation intact in upper and lower extremities b/l; cranial nerves II through XII grossly intact; no pronator drift; no evidence suggestive of slurred speech, dysarthria, or facial droop; Normal muscle tone. No tremors. *** Neuro: In the setting of the patient's current mental status and associated inability to follow instructions, unable to perform full neurologic exam at this time.  As such, assessment of strength, sensation, and cranial nerves is limited at this time. Patient noted to spontaneously move all 4 extremities. No tremors.  ***    Labs on Admission: I have personally reviewed following labs and imaging studies  CBC: Recent Labs  Lab 05/31/23 1140  WBC 15.0*  HGB 15.8*  HCT 50.7*  MCV 87.7  PLT 418*   Basic Metabolic Panel: Recent Labs  Lab 05/31/23 1140  NA 140  K 3.4*  CL 101  CO2 21*  GLUCOSE 180*  BUN 15  CREATININE 0.64  CALCIUM 9.8   GFR: Estimated Creatinine Clearance: 68.3 mL/min (by C-G formula based on SCr of 0.64 mg/dL). Liver Function Tests: Recent Labs  Lab 05/31/23 1140  AST 23  ALT 25  ALKPHOS 106  BILITOT 1.0  PROT 8.6*  ALBUMIN 4.6   Recent Labs  Lab 05/31/23 1140  LIPASE 48   No results for input(s): "AMMONIA" in the last 168 hours. Coagulation Profile: No results for input(s): "INR", "PROTIME" in the last 168 hours. Cardiac Enzymes: No results for input(s): "CKTOTAL", "CKMB", "CKMBINDEX", "TROPONINI" in the last 168 hours. BNP (last 3 results) No results for input(s): "PROBNP" in the last 8760 hours. HbA1C: No results for input(s): "HGBA1C"  in the last  72 hours. CBG: No results for input(s): "GLUCAP" in the last 168 hours. Lipid Profile: No results for input(s): "CHOL", "HDL", "LDLCALC", "TRIG", "CHOLHDL", "LDLDIRECT" in the last 72 hours. Thyroid Function Tests: No results for input(s): "TSH", "T4TOTAL", "FREET4", "T3FREE", "THYROIDAB" in the last 72 hours. Anemia Panel: No results for input(s): "VITAMINB12", "FOLATE", "FERRITIN", "TIBC", "IRON", "RETICCTPCT" in the last 72 hours. Urine analysis:    Component Value Date/Time   COLORURINE YELLOW 05/31/2023 1140   APPEARANCEUR CLEAR 05/31/2023 1140   LABSPEC 1.036 (H) 05/31/2023 1140   PHURINE 5.0 05/31/2023 1140   GLUCOSEU >=500 (A) 05/31/2023 1140   HGBUR NEGATIVE 05/31/2023 1140   BILIRUBINUR NEGATIVE 05/31/2023 1140   BILIRUBINUR negative 06/27/2018 1700   KETONESUR 80 (A) 05/31/2023 1140   PROTEINUR 100 (A) 05/31/2023 1140   UROBILINOGEN 0.2 06/27/2018 1700   NITRITE NEGATIVE 05/31/2023 1140   LEUKOCYTESUR NEGATIVE 05/31/2023 1140    Radiological Exams on Admission: CT ABDOMEN PELVIS W CONTRAST  Result Date: 05/31/2023 CLINICAL DATA:  Abdominal pain, nausea, vomiting EXAM: CT ABDOMEN AND PELVIS WITH CONTRAST TECHNIQUE: Multidetector CT imaging of the abdomen and pelvis was performed using the standard protocol following bolus administration of intravenous contrast. RADIATION DOSE REDUCTION: This exam was performed according to the departmental dose-optimization program which includes automated exposure control, adjustment of the mA and/or kV according to patient size and/or use of iterative reconstruction technique. CONTRAST:  OMNIPAQUE IOHEXOL 300 MG/ML  SOLN COMPARISON:  07/04/2022 FINDINGS: Lower chest: There is no focal consolidation in the lower lung fields. Coronary artery calcifications are seen. Hepatobiliary: There is fatty infiltration in liver. There is a 7 mm low-density in the left lobe of liver, possibly cyst or hemangioma with no interval change.  There is no dilation of bile ducts. Gallbladder is unremarkable. Pancreas: No focal abnormalities are seen. Spleen: Unremarkable. Adrenals/Urinary Tract: Adrenals are unremarkable. There is no hydronephrosis. There are no renal or ureteral stones. Urinary bladder is unremarkable. Stomach/Bowel: Small hiatal hernia is seen. Small bowel loops are not dilated. Appendix is unremarkable. There is mild diffuse wall thickening in ascending, transverse, descending and sigmoid colon. There is incomplete distention of colon. Few scattered diverticula are seen in colon without signs of focal diverticulitis. Moderate amount of stool is seen in rectum. Vascular/Lymphatic: Scattered arterial calcifications are seen. Reproductive: Uterus appears smaller than usual suggesting atrophy or partial removal. Lobulation seen in the anterior margin of the uterus has not changed. Other: There is no ascites or pneumoperitoneum. Small umbilical hernia containing fat is seen. Musculoskeletal: No acute findings are seen. IMPRESSION: There is mild diffuse wall thickening in colon which may be due to incomplete distention or suggest nonspecific inflammatory or infectious colitis. There is no loculated pericolic fluid collection. Few diverticula are seen in the colon without signs of focal acute diverticulitis. There is no hydronephrosis.  The appendix is not dilated. Small hiatal hernia. Coronary artery calcifications are seen. Aortic arteriosclerosis. Electronically Signed   By: Ernie Avena M.D.   On: 05/31/2023 14:19      Assessment/Plan    Principal Problem:   Intractable nausea and  vomiting  ***              ***                ***               ***               ***               ***              ***               ***               ***               ***               ***               ***               ***              ***  DVT prophylaxis: SCD's ***  Code Status: Full code*** Family Communication: none*** Disposition Plan: Per Rounding Team Consults called: none***;  Admission status: ***    I SPENT GREATER THAN 75 *** MINUTES IN CLINICAL CARE TIME/MEDICAL DECISION-MAKING IN COMPLETING THIS ADMISSION.     Chaney Born Lavender Stanke DO [facility] From 7PM - 7AM   05/31/2023, 8:06 PM   ***

## 2023-05-31 NOTE — ED Provider Notes (Signed)
[place] EMERGENCY DEPARTMENT AT [facility] Provider Note   CSN: [id number] Arrival date & time: 05/31/23  1103     History  Chief Complaint  Patient presents with   Nausea   Emesis    [name] is a 62 y.o. female.  Pt with c/o nausea/vomiting and mid/upper abd pain. Symptoms present in past day. Unsure whether or not could have eaten bad food/sausage. Denies known ill contacts. No diarrhea or constipation. No abd distension. No bilious emesis. Denies chest pain or discomfort. No sob. No fever/chills.   The history is provided by the patient, the EMS personnel and medical records.       Home Medications Prior to Admission medications   Medication Sig Start Date End Date Taking? Authorizing Provider  Ascorbic Acid (VITAMIN C PO) Take 1 tablet by mouth daily.    [provider]  aspirin 81 MG chewable tablet Chew 1 tablet (81 mg total) by mouth 2 (two) times daily. 02/27/23   Kathryne Hitch, MD  atorvastatin (LIPITOR) 40 MG tablet Take 1 tablet (40 mg total) by mouth daily. 05/25/23   Georganna Skeans, MD  Continuous Glucose Sensor (FREESTYLE LIBRE 3 SENSOR) MISC APPLY 1 SENSOR EVERY 14 DAYS 05/25/23   Georganna Skeans, MD  cyanocobalamin (VITAMIN B12) 1000 MCG tablet Take 1 tablet (1,000 mcg total) by mouth daily. 05/25/23   Georganna Skeans, MD  FARXIGA 10 MG TABS tablet Take 1 tablet (10 mg total) by mouth daily. 05/25/23   Georganna Skeans, MD  gabapentin (NEURONTIN) 600 MG tablet Take 1 tablet (600 mg total) by mouth 3 (three) times daily. 05/25/23   Georganna Skeans, MD  glucose blood (BAYER CONTOUR TEST) test strip TEST BLOOD SUGARS 3 TIMES DAILY 05/25/23   Georganna Skeans, MD  HYDROcodone-acetaminophen (NORCO/VICODIN) 5-325 MG tablet Take 1 tablet by mouth every 6 (six) hours as needed for moderate pain. 04/29/23   Kathryne Hitch, MD  hydrOXYzine (ATARAX) 25 MG tablet Take 1 tablet (25 mg total) by mouth 3 (three) times daily as needed. itching  05/25/23   Georganna Skeans, MD  insulin aspart (FIASP FLEXTOUCH) 100 UNIT/ML FlexTouch Pen Inject 6-20 Units into the skin 3 (three) times daily before meals. 05/25/23   Georganna Skeans, MD  insulin degludec (TRESIBA FLEXTOUCH) 200 UNIT/ML FlexTouch Pen INJECT 30 TO 34 UNITS SUBCUTANEOUSLY ONCE DAILY Strength: 200 UNIT/ML 05/25/23   Georganna Skeans, MD  Insulin Pen Needle 32G X 4 MM MISC Use 4x a day 05/25/23   Georganna Skeans, MD  Lancet Devices (EASY TOUCH LANCING DEVICE) MISC Use to check diabetes 4 times daily  E 11.65 05/25/23   Georganna Skeans, MD  latanoprost (XALATAN) 0.005 % ophthalmic solution Place 1 drop into both eyes at bedtime. 12/02/18   [provider]  lidocaine (XYLOCAINE) 5 % ointment Apply 1 application. topically as needed. Patient not taking: Reported on 05/25/2023 04/20/22   Merrilee Jansky, MD  metFORMIN (GLUCOPHAGE) 1000 MG tablet Take 1 tablet (1,000 mg total) by mouth daily with supper. 05/25/23   Georganna Skeans, MD  metoprolol succinate (TOPROL-XL) 50 MG 24 hr tablet TAKE 1 TABLET BY MOUTH ONCE DAILY. TAKE  WITH  OR  IMMEDIATELY  FOLLOWING  A  MEAL 05/25/23   Georganna Skeans, MD  naloxone [facility]) nasal spray 4 mg/0.1 mL Place 1 spray into the nose as needed (or). 12/09/20   [provider]  ondansetron (ZOFRAN-ODT) 8 MG disintegrating tablet Take 1 tablet (8 mg total) by mouth every  8 (eight) hours as needed for nausea or vomiting. 04/20/23   Mayers, Cari S, PA-C  OZEMPIC, 2 MG/DOSE, 8 MG/3ML SOPN Inject 2 mg into the skin once a week. Saturday 05/25/23   Georganna Skeans, MD  pantoprazole (PROTONIX) 40 MG tablet Take 1 tablet (40 mg total) by mouth 2 (two) times daily. 07/06/22   Osvaldo Shipper, MD  solifenacin (VESICARE) 5 MG tablet Take 5 mg by mouth daily. 05/29/22   [provider]  tiZANidine (ZANAFLEX) 4 MG tablet Take 1 tablet (4 mg total) by mouth every 6 (six) hours as needed for muscle spasms. 05/25/23   Georganna Skeans, MD  triamcinolone cream (KENALOG)  0.1 % Apply 1 Application topically 3 (three) times daily as needed (itching).    [provider]      Allergies    Oxycodone-acetaminophen and Penicillins    Review of Systems   Review of Systems  Constitutional:  Negative for chills and fever.  HENT:  Negative for sore throat.   Eyes:  Negative for redness.  Respiratory:  Negative for cough and shortness of breath.   Cardiovascular:  Negative for chest pain.  Gastrointestinal:  Positive for abdominal pain, nausea and vomiting.  Genitourinary:  Negative for dysuria and flank pain.  Musculoskeletal:  Negative for back pain and neck pain.  Skin:  Negative for rash.  Neurological:  Negative for headaches.  Hematological:  Does not bruise/bleed easily.  Psychiatric/Behavioral:  Negative for confusion.     Physical Exam Updated Vital Signs BP (!) 176/91   Pulse (!) 113   Temp 97.8 F (36.6 C) (Oral)   Resp 17   Ht 1.6 m (5\' 3" )   Wt 71.7 kg   SpO2 99%   BMI 27.99 kg/m  Physical Exam Vitals and nursing note reviewed.  Constitutional:      Appearance: Normal appearance. She is well-developed.  HENT:     Head: Atraumatic.     Nose: Nose normal.     Mouth/Throat:     Mouth: Mucous membranes are moist.  Eyes:     General: No scleral icterus.    Conjunctiva/sclera: Conjunctivae normal.     Pupils: Pupils are equal, round, and reactive to light.  Neck:     Trachea: No tracheal deviation.  Cardiovascular:     Rate and Rhythm: Normal rate and regular rhythm.     Pulses: Normal pulses.     Heart sounds: Normal heart sounds. No murmur heard.    No friction rub. No gallop.  Pulmonary:     Effort: Pulmonary effort is normal. No respiratory distress.     Breath sounds: Normal breath sounds.  Abdominal:     General: Bowel sounds are normal. There is no distension.     Palpations: Abdomen is soft. There is no mass.     Tenderness: There is no abdominal tenderness. There is no guarding or rebound.     Hernia: No  hernia is present.  Genitourinary:    Comments: No cva tenderness.  Musculoskeletal:        General: No swelling.     Cervical back: Normal range of motion and neck supple. No rigidity. No muscular tenderness.  Skin:    General: Skin is warm and dry.     Findings: No rash.  Neurological:     Mental Status: She is alert.     Comments: Alert, speech normal.   Psychiatric:        Mood and Affect: Mood normal.  ED Results / Procedures / Treatments   Labs (all labs ordered are listed, but only abnormal results are displayed) Results for orders placed or performed during the hospital encounter of 05/31/23  CBC  Result Value Ref Range   WBC 15.0 (H) 4.0 - 10.5 K/uL   RBC 5.78 (H) 3.87 - 5.11 MIL/uL   Hemoglobin 15.8 (H) 12.0 - 15.0 g/dL   HCT 57.8 (H) 46.9 - 62.9 %   MCV 87.7 80.0 - 100.0 fL   MCH 27.3 26.0 - 34.0 pg   MCHC 31.2 30.0 - 36.0 g/dL   RDW 52.8 41.3 - 24.4 %   Platelets 418 (H) 150 - 400 K/uL   nRBC 0.0 0.0 - 0.2 %  Comprehensive metabolic panel  Result Value Ref Range   Sodium 140 135 - 145 mmol/L   Potassium 3.4 (L) 3.5 - 5.1 mmol/L   Chloride 101 98 - 111 mmol/L   CO2 21 (L) 22 - 32 mmol/L   Glucose, Bld 180 (H) 70 - 99 mg/dL   BUN 15 8 - 23 mg/dL   Creatinine, Ser 0.10 0.44 - 1.00 mg/dL   Calcium 9.8 8.9 - 27.2 mg/dL   Total Protein 8.6 (H) 6.5 - 8.1 g/dL   Albumin 4.6 3.5 - 5.0 g/dL   AST 23 15 - 41 U/L   ALT 25 0 - 44 U/L   Alkaline Phosphatase 106 38 - 126 U/L   Total Bilirubin 1.0 0.3 - 1.2 mg/dL   GFR, Estimated >53 >66 mL/min   Anion gap 18 (H) 5 - 15  Rapid urine drug screen (hospital performed)  Result Value Ref Range   Opiates NONE DETECTED NONE DETECTED   Cocaine NONE DETECTED NONE DETECTED   Benzodiazepines NONE DETECTED NONE DETECTED   Amphetamines NONE DETECTED NONE DETECTED   Tetrahydrocannabinol POSITIVE (A) NONE DETECTED   Barbiturates NONE DETECTED NONE DETECTED  Urinalysis, Routine w reflex microscopic -Urine, Clean Catch   Result Value Ref Range   Color, Urine YELLOW YELLOW   APPearance CLEAR CLEAR   Specific Gravity, Urine 1.036 (H) 1.005 - 1.030   pH 5.0 5.0 - 8.0   Glucose, UA >=500 (A) NEGATIVE mg/dL   Hgb urine dipstick NEGATIVE NEGATIVE   Bilirubin Urine NEGATIVE NEGATIVE   Ketones, ur 80 (A) NEGATIVE mg/dL   Protein, ur 440 (A) NEGATIVE mg/dL   Nitrite NEGATIVE NEGATIVE   Leukocytes,Ua NEGATIVE NEGATIVE   RBC / HPF 0-5 0 - 5 RBC/hpf   WBC, UA 0-5 0 - 5 WBC/hpf   Bacteria, UA RARE (A) NONE SEEN   Squamous Epithelial / HPF 0-5 0 - 5 /HPF  Lipase, blood  Result Value Ref Range   Lipase 48 11 - 51 U/L  Troponin I (High Sensitivity)  Result Value Ref Range   Troponin I (High Sensitivity) 18 (H) <18 ng/L   CT ABDOMEN PELVIS W CONTRAST  Result Date: 05/31/2023 CLINICAL DATA:  Abdominal pain, nausea, vomiting EXAM: CT ABDOMEN AND PELVIS WITH CONTRAST TECHNIQUE: Multidetector CT imaging of the abdomen and pelvis was performed using the standard protocol following bolus administration of intravenous contrast. RADIATION DOSE REDUCTION: This exam was performed according to the departmental dose-optimization program which includes automated exposure control, adjustment of the mA and/or kV according to patient size and/or use of iterative reconstruction technique. CONTRAST:  OMNIPAQUE IOHEXOL 300 MG/ML  SOLN COMPARISON:  07/04/2022 FINDINGS: Lower chest: There is no focal consolidation in the lower lung fields. Coronary artery calcifications are seen. Hepatobiliary: There  is fatty infiltration in liver. There is a 7 mm low-density in the left lobe of liver, possibly cyst or hemangioma with no interval change. There is no dilation of bile ducts. Gallbladder is unremarkable. Pancreas: No focal abnormalities are seen. Spleen: Unremarkable. Adrenals/Urinary Tract: Adrenals are unremarkable. There is no hydronephrosis. There are no renal or ureteral stones. Urinary bladder is unremarkable. Stomach/Bowel: Small  hiatal hernia is seen. Small bowel loops are not dilated. Appendix is unremarkable. There is mild diffuse wall thickening in ascending, transverse, descending and sigmoid colon. There is incomplete distention of colon. Few scattered diverticula are seen in colon without signs of focal diverticulitis. Moderate amount of stool is seen in rectum. Vascular/Lymphatic: Scattered arterial calcifications are seen. Reproductive: Uterus appears smaller than usual suggesting atrophy or partial removal. Lobulation seen in the anterior margin of the uterus has not changed. Other: There is no ascites or pneumoperitoneum. Small umbilical hernia containing fat is seen. Musculoskeletal: No acute findings are seen. IMPRESSION: There is mild diffuse wall thickening in colon which may be due to incomplete distention or suggest nonspecific inflammatory or infectious colitis. There is no loculated pericolic fluid collection. Few diverticula are seen in the colon without signs of focal acute diverticulitis. There is no hydronephrosis.  The appendix is not dilated. Small hiatal hernia. Coronary artery calcifications are seen. Aortic arteriosclerosis. Electronically Signed   By: Ernie Avena M.D.   On: 05/31/2023 14:19   XR Knee 1-2 Views Left  Result Date: 05/27/2023 Radiographs: No acute fracture.  Total arthroplasty components well-seated.  Knee is well located.  No bony abnormalities.    EKG EKG Interpretation Date/Time:  Monday May 31 2023 11:24:48 EDT Ventricular Rate:  107 PR Interval:  117 QRS Duration:  92 QT Interval:  350 QTC Calculation: 467 R Axis:   60  Text Interpretation: Sinus tachycardia Nonspecific ST abnormality Confirmed by Cathren Laine ([id number]) on 05/31/2023 11:27:56 AM  Radiology CT ABDOMEN PELVIS W CONTRAST  Result Date: 05/31/2023 CLINICAL DATA:  Abdominal pain, nausea, vomiting EXAM: CT ABDOMEN AND PELVIS WITH CONTRAST TECHNIQUE: Multidetector CT imaging of the abdomen and pelvis was  performed using the standard protocol following bolus administration of intravenous contrast. RADIATION DOSE REDUCTION: This exam was performed according to the departmental dose-optimization program which includes automated exposure control, adjustment of the mA and/or kV according to patient size and/or use of iterative reconstruction technique. CONTRAST:  OMNIPAQUE IOHEXOL 300 MG/ML  SOLN COMPARISON:  07/04/2022 FINDINGS: Lower chest: There is no focal consolidation in the lower lung fields. Coronary artery calcifications are seen. Hepatobiliary: There is fatty infiltration in liver. There is a 7 mm low-density in the left lobe of liver, possibly cyst or hemangioma with no interval change. There is no dilation of bile ducts. Gallbladder is unremarkable. Pancreas: No focal abnormalities are seen. Spleen: Unremarkable. Adrenals/Urinary Tract: Adrenals are unremarkable. There is no hydronephrosis. There are no renal or ureteral stones. Urinary bladder is unremarkable. Stomach/Bowel: Small hiatal hernia is seen. Small bowel loops are not dilated. Appendix is unremarkable. There is mild diffuse wall thickening in ascending, transverse, descending and sigmoid colon. There is incomplete distention of colon. Few scattered diverticula are seen in colon without signs of focal diverticulitis. Moderate amount of stool is seen in rectum. Vascular/Lymphatic: Scattered arterial calcifications are seen. Reproductive: Uterus appears smaller than usual suggesting atrophy or partial removal. Lobulation seen in the anterior margin of the uterus has not changed. Other: There is no ascites or pneumoperitoneum. Small umbilical hernia containing fat is  seen. Musculoskeletal: No acute findings are seen. IMPRESSION: There is mild diffuse wall thickening in colon which may be due to incomplete distention or suggest nonspecific inflammatory or infectious colitis. There is no loculated pericolic fluid collection. Few diverticula are  seen in the colon without signs of focal acute diverticulitis. There is no hydronephrosis.  The appendix is not dilated. Small hiatal hernia. Coronary artery calcifications are seen. Aortic arteriosclerosis. Electronically Signed   By: Ernie Avena M.D.   On: 05/31/2023 14:19    Procedures Procedures    Medications Ordered in ED Medications  ciprofloxacin (CIPRO) IVPB 400 mg (has no administration in time range)  metroNIDAZOLE (FLAGYL) IVPB 500 mg (has no administration in time range)  lactated ringers bolus 1,000 mL (0 mLs Intravenous Stopped 05/31/23 1536)  ondansetron (ZOFRAN) injection 4 mg (4 mg Intravenous Given 05/31/23 1140)  HYDROmorphone (DILAUDID) injection 0.5 mg (0.5 mg Intravenous Given 05/31/23 1140)  iohexol (OMNIPAQUE) 300 MG/ML solution 100 mL (100 mLs Intravenous Contrast Given 05/31/23 1315)  HYDROmorphone (DILAUDID) injection 0.5 mg (0.5 mg Intravenous Given 05/31/23 1459)  alum & mag hydroxide-simeth (MAALOX/MYLANTA) 200-200-20 MG/5ML suspension 30 mL (30 mLs Oral Given 05/31/23 1534)  famotidine (PEPCID) tablet 20 mg (20 mg Oral Given 05/31/23 1535)  acetaminophen (TYLENOL) tablet 1,000 mg (1,000 mg Oral Given 05/31/23 1535)  lactated ringers bolus 500 mL (500 mLs Intravenous New Bag/Given 05/31/23 1547)  metoprolol tartrate (LOPRESSOR) tablet 50 mg (50 mg Oral Given 05/31/23 1609)    ED Course/ Medical Decision Making/ A&P                             Medical Decision Making Problems Addressed: Colitis: acute illness or injury with systemic symptoms that poses a threat to life or bodily functions Dehydration: acute illness or injury with systemic symptoms Elevated blood pressure reading: acute illness or injury Essential hypertension: chronic illness or injury with exacerbation, progression, or side effects of treatment that poses a threat to life or bodily functions Gastroenteritis: acute illness or injury Generalized abdominal pain: acute illness or injury with systemic  symptoms that poses a threat to life or bodily functions Nausea and vomiting in adult: acute illness or injury with systemic symptoms that poses a threat to life or bodily functions  Amount and/or Complexity of Data Reviewed Independent Historian:     Details: Family, hx External Data Reviewed: notes. Labs: ordered. Decision-making details documented in ED Course. Radiology: ordered and independent interpretation performed. Decision-making details documented in ED Course. ECG/medicine tests: ordered and independent interpretation performed. Decision-making details documented in ED Course.  Risk OTC drugs. Prescription drug management. Parenteral controlled substances. Decision regarding hospitalization.   Iv ns. Continuous pulse ox and cardiac monitoring. Labs ordered/sent. Imaging ordered.   Differential diagnosis includes gastroenteritis, dehydration, gastritis/pud, etc. Dispo decision including potential need for admission considered - will get labs and imaging and reassess.   Reviewed nursing notes and prior charts for additional history. External reports reviewed. Additional history from: EMS.  LR bolus. Dilaudid iv, zofran iv.   Cardiac monitor: sinus rhythm, rate 70.  Labs reviewed/interpreted by me - wbc mildly high. K sl low. Kcl po. Lipase normal.   CT reviewed/interpreted by me - ?colitis.   Etiology symptoms not entirely clear, possible colitis (also possible contribution cannabinoid hyperemesis, possible gastroenteritis, possible contribution of ozempic causing gi symptoms, etc).  On recheck, +abd tenderness/pain, wbc is elev - will tx with abx  therapy. Allergy to pcn.  Cipro/flagyl, ivf in ED.   Recheck no recurrent emesis. Denies chest pain or discomfort.   Pt receiving ivf, antibiotics, po challenge. Signed out to Dr Lynelle Doctor, if continued improved, anticipate probable d/c with close pcp f/u.          Final Clinical Impression(s) / ED Diagnoses Final  diagnoses:  Nausea and vomiting in adult  Generalized abdominal pain  Elevated blood pressure reading  Essential hypertension  Dehydration  Colitis  Gastroenteritis    Rx / DC Orders ED Discharge Orders     None         Cathren Laine, MD 05/31/23 1731

## 2023-05-31 NOTE — ED Triage Notes (Signed)
Pt BIBA with c/o N/V x12hrs. Started after eating beef hotdogs that "didn't taste right". Blood-tinged emesis. Hx HTN.   BP 197/116 HR 106 CBG 179

## 2023-05-31 NOTE — ED Notes (Signed)
Nurse notified of BP 

## 2023-05-31 NOTE — ED Provider Notes (Signed)
Patient initially seen by Dr. Denton Lank.  Please see his note.  Patient CT scan shows possible colitis.  Patient states she still having trouble with nausea and vomiting and not able to keep anything down.  Because of her persistent nausea vomiting and findings of colitis I will consult with medical service for admission.  Case discussed with Dr Gwenyth Ober, MD 05/31/23 2004

## 2023-05-31 NOTE — ED Notes (Signed)
Pt sleeping at this time, NAD observed, respirations even and unlabored. Family at bedside.

## 2023-06-01 DIAGNOSIS — R112 Nausea with vomiting, unspecified: Secondary | ICD-10-CM | POA: Diagnosis not present

## 2023-06-01 DIAGNOSIS — E1165 Type 2 diabetes mellitus with hyperglycemia: Secondary | ICD-10-CM | POA: Diagnosis not present

## 2023-06-01 DIAGNOSIS — F1290 Cannabis use, unspecified, uncomplicated: Secondary | ICD-10-CM

## 2023-06-01 DIAGNOSIS — E1169 Type 2 diabetes mellitus with other specified complication: Secondary | ICD-10-CM

## 2023-06-01 DIAGNOSIS — N19 Unspecified kidney failure: Secondary | ICD-10-CM | POA: Diagnosis present

## 2023-06-01 DIAGNOSIS — I10 Essential (primary) hypertension: Secondary | ICD-10-CM | POA: Diagnosis not present

## 2023-06-01 DIAGNOSIS — E8729 Other acidosis: Secondary | ICD-10-CM | POA: Diagnosis present

## 2023-06-01 DIAGNOSIS — R6510 Systemic inflammatory response syndrome (SIRS) of non-infectious origin without acute organ dysfunction: Secondary | ICD-10-CM | POA: Diagnosis present

## 2023-06-01 DIAGNOSIS — R109 Unspecified abdominal pain: Secondary | ICD-10-CM | POA: Diagnosis present

## 2023-06-01 DIAGNOSIS — E860 Dehydration: Secondary | ICD-10-CM | POA: Diagnosis present

## 2023-06-01 LAB — COMPREHENSIVE METABOLIC PANEL
ALT: 19 U/L (ref 0–44)
AST: 16 U/L (ref 15–41)
Albumin: 3.7 g/dL (ref 3.5–5.0)
Alkaline Phosphatase: 82 U/L (ref 38–126)
Anion gap: 11 (ref 5–15)
BUN: 18 mg/dL (ref 8–23)
CO2: 26 mmol/L (ref 22–32)
Calcium: 8.9 mg/dL (ref 8.9–10.3)
Chloride: 96 mmol/L — ABNORMAL LOW (ref 98–111)
Creatinine, Ser: 0.67 mg/dL (ref 0.44–1.00)
GFR, Estimated: >60 mL/min (ref >60)
Glucose, Bld: 203 mg/dL — ABNORMAL HIGH (ref 70–99)
Potassium: 4.2 mmol/L (ref 3.5–5.1)
Sodium: 133 mmol/L — ABNORMAL LOW (ref 135–145)
Total Bilirubin: 0.9 mg/dL (ref 0.3–1.2)
Total Protein: 7 g/dL (ref 6.5–8.1)

## 2023-06-01 LAB — CBC WITH DIFFERENTIAL/PLATELET
Abs Immature Granulocytes: 0.05 10*3/uL (ref 0.00–0.07)
Basophils Absolute: 0.1 10*3/uL (ref 0.0–0.1)
Basophils Relative: 1 %
Eosinophils Absolute: 0.1 10*3/uL (ref 0.0–0.5)
Eosinophils Relative: 1 %
HCT: 42.3 % (ref 36.0–46.0)
Hemoglobin: 12.9 g/dL (ref 12.0–15.0)
Immature Granulocytes: 0 %
Lymphocytes Relative: 25 %
Lymphs Abs: 2.9 10*3/uL (ref 0.7–4.0)
MCH: 27 pg (ref 26.0–34.0)
MCHC: 30.5 g/dL (ref 30.0–36.0)
MCV: 88.5 fL (ref 80.0–100.0)
Monocytes Absolute: 0.9 10*3/uL (ref 0.1–1.0)
Monocytes Relative: 7 %
Neutro Abs: 7.8 10*3/uL — ABNORMAL HIGH (ref 1.7–7.7)
Neutrophils Relative %: 66 %
Platelets: 369 10*3/uL (ref 150–400)
RBC: 4.78 MIL/uL (ref 3.87–5.11)
RDW: 14.1 % (ref 11.5–15.5)
WBC: 11.7 10*3/uL — ABNORMAL HIGH (ref 4.0–10.5)
nRBC: 0 % (ref 0.0–0.2)

## 2023-06-01 LAB — GLUCOSE, CAPILLARY
Glucose-Capillary: 205 mg/dL — ABNORMAL HIGH (ref 70–99)
Glucose-Capillary: 201 mg/dL — ABNORMAL HIGH (ref 70–99)
Glucose-Capillary: 154 mg/dL — ABNORMAL HIGH (ref 70–99)
Glucose-Capillary: 152 mg/dL — ABNORMAL HIGH (ref 70–99)
Glucose-Capillary: 108 mg/dL — ABNORMAL HIGH (ref 70–99)

## 2023-06-01 LAB — PROTIME-INR
INR: 1 (ref 0.8–1.2)
Prothrombin Time: 13.2 seconds (ref 11.4–15.2)

## 2023-06-01 LAB — SEDIMENTATION RATE: Sed Rate: 5 mm/hr (ref 0–22)

## 2023-06-01 LAB — MAGNESIUM: Magnesium: 2 mg/dL (ref 1.7–2.4)

## 2023-06-01 LAB — C-REACTIVE PROTEIN: CRP: 0.7 mg/dL (ref <1.0)

## 2023-06-01 LAB — TSH: TSH: 0.903 u[IU]/mL (ref 0.350–4.500)

## 2023-06-01 LAB — SALICYLATE LEVEL: Salicylate Lvl: <7 mg/dL — ABNORMAL LOW (ref 7.0–30.0)

## 2023-06-01 MED ORDER — ATORVASTATIN CALCIUM 40 MG PO TABS
40.0000 mg | ORAL_TABLET | Freq: Every day | ORAL | Status: DC
  Administered 2023-06-02 – 2023-06-04 (×3): 40 mg via ORAL
  Filled 2023-06-01 (×4): qty 1

## 2023-06-01 MED ORDER — ALUM & MAG HYDROXIDE-SIMETH 200-200-20 MG/5ML PO SUSP: 30.0000 mL | Freq: Once | ORAL | Status: DC

## 2023-06-01 MED ORDER — PROCHLORPERAZINE EDISYLATE 10 MG/2ML IJ SOLN: 5.0000 mg | Freq: Once | INTRAMUSCULAR | Status: DC | PRN

## 2023-06-01 MED ORDER — LACTATED RINGERS IV SOLN: INTRAVENOUS | Status: AC

## 2023-06-01 MED ORDER — GABAPENTIN 300 MG PO CAPS
600.0000 mg | ORAL_CAPSULE | Freq: Three times a day (TID) | ORAL | Status: DC
  Administered 2023-06-01 – 2023-06-04 (×8): 600 mg via ORAL
  Filled 2023-06-01 (×9): qty 2

## 2023-06-01 MED ORDER — METOPROLOL SUCCINATE ER 50 MG PO TB24
50.0000 mg | ORAL_TABLET | Freq: Every day | ORAL | Status: DC
  Administered 2023-06-02 – 2023-06-04 (×3): 50 mg via ORAL
  Filled 2023-06-01 (×4): qty 1

## 2023-06-01 MED ORDER — OXYBUTYNIN CHLORIDE ER 5 MG PO TB24
10.0000 mg | ORAL_TABLET | Freq: Every day | ORAL | Status: DC
  Administered 2023-06-02 – 2023-06-03 (×2): 10 mg via ORAL
  Filled 2023-06-01 (×2): qty 2

## 2023-06-01 MED ORDER — ALUM & MAG HYDROXIDE-SIMETH 200-200-20 MG/5ML PO SUSP: 30.0000 mL | Freq: Four times a day (QID) | ORAL | Status: DC | PRN

## 2023-06-01 MED ORDER — INSULIN GLARGINE-YFGN 100 UNIT/ML ~~LOC~~ SOLN
15.0000 [IU] | Freq: Every day | SUBCUTANEOUS | Status: DC
  Administered 2023-06-01 – 2023-06-04 (×4): 15 [IU] via SUBCUTANEOUS
  Filled 2023-06-01 (×4): qty 0.15

## 2023-06-01 MED ORDER — BUTALBITAL-APAP-CAFFEINE 50-325-40 MG PO TABS: 2.0000 | ORAL_TABLET | Freq: Once | ORAL | Status: DC

## 2023-06-01 MED ORDER — INSULIN ASPART 100 UNIT/ML IJ SOLN
0.0000 [IU] | Freq: Three times a day (TID) | INTRAMUSCULAR | Status: DC
  Administered 2023-06-01 (×2): 2 [IU] via SUBCUTANEOUS
  Administered 2023-06-01: 3 [IU] via SUBCUTANEOUS
  Administered 2023-06-02: 1 [IU] via SUBCUTANEOUS
  Administered 2023-06-02: 2 [IU] via SUBCUTANEOUS
  Administered 2023-06-02 – 2023-06-03 (×2): 1 [IU] via SUBCUTANEOUS
  Administered 2023-06-03: 2 [IU] via SUBCUTANEOUS
  Administered 2023-06-03 – 2023-06-04 (×2): 3 [IU] via SUBCUTANEOUS

## 2023-06-01 NOTE — Progress Notes (Signed)
PROGRESS NOTE   [name]  GLO:[id number] DOB: 01-Jul-1961 DOA: 05/31/2023 PCP: Georganna Skeans, MD    Brief Narrative:  62 year old female with past medical history of hypertension, hyperlipidemia, gastroesophageal reflux disease, diabetes mellitus type 2 complicated by peripheral polyneuropathy who presented to [facility] emergency department with decline in oral intake over 3 to 4 days followed by complaints of nausea and vomiting for 1 day.  Upon evaluation in the emergency department CT imaging of the abdomen pelvis revealed diffuse wall thickening of the colon concerning for possible inflammatory versus infectious colitis.  Patient was administered intravenous fluids and opiate-based analgesics.  She did initially receive ciprofloxacin and Flagyl in the emergency department.   Due to patient's ongoing intractable nausea and vomiting and inability to tolerate oral intake hospitalist group was then called to assess the patient for admission to the hospital.  Considering patient's unimpressive CT imaging and lack of fever and the fact that she was was hospitalized with a similar presentation in mid 2023 and at this time this was felt to be secondary to cannabinoid hyperemesis syndrome antibiotics were discontinued and patient was initially managed with supportive care including antiemetics and intravenous fluids.   Assessment and Plan: * Intractable nausea and vomiting Patient continuing to experience intractable nausea and vomiting and inability to tolerate oral intake Continuing supportive care with intravenous fluids and antiemetics with occasional opiates for associated abdominal pain CT imaging revealed some bowel wall thickening initially bringing up concern that perhaps there is an underlying infectious colitis.  However this is mild and in the absence of elevated inflammatory markers or fever antibiotics are being held for now. Furthermore patient had a similar presentation  in mid 2023 for symptoms improved with supportive care. Urine toxicology screen continues to be positive for marijuana.  At this point I feel the patient's are most likely secondary to either hyperemesis cannabinoid syndrome or diabetic gastroparesis. If patient fails to improve in the next 24 hours will consider reinitiation of antibiotics We will go ahead and place the patient on a trial of Reglan Continue intravenous fluids, antiemetics  Marijuana use, continuous Will need to counsel patient on cessation prior to discharge  Uncontrolled type 2 diabetes mellitus with hyperglycemia, with long-term current use of insulin (HCC) Hyperglycemic on arrival with elevated hemoglobin A1c Accu-Cheks before every meal and nightly with sliding scale insulin Reduced regimen of Semglee for now as patient's oral intake is tenuous Hold metformin  Mixed hyperlipidemia due to type 2 diabetes mellitus (HCC) Continuing home regimen of lipid lowering therapy.   Essential hypertension Continue home regimen of metoprolol As needed intravenous antihypertensives for markedly elevated blood pressure  Diabetic peripheral neuropathy associated with type 2 diabetes mellitus (HCC) Continue home regimen of Neurontin  GERD without esophagitis Continuing home regimen of daily PPI therapy.     Subjective:  Patient continuing to complain of nausea with occasional bouts of nonbilious nonbloody vomiting.  Patient was complaining of associated throat and chest pain whenever attempting to ingest juice or clear liquids.  Patient denies any associated diarrhea.  Physical Exam:  Vitals:   06/01/23 1313 06/01/23 1436 06/01/23 2101   BP: (!) 181/86 (!) 145/86 127/74   Pulse: (!) 105  (!) 104   Resp: 18  16   Temp: 98.4 F (36.9 C)  98.1 F (36.7 C)   TempSrc: Oral  Oral   SpO2: 99%  96%   Weight:      Height:  Constitutional: Awake alert and oriented x3, no associated distress.   Skin: no rashes, no  lesions, good skin turgor noted. Eyes: Pupils are equally reactive to light.  No evidence of scleral icterus or conjunctival pallor.  ENMT: Moist mucous membranes noted.  Posterior pharynx clear of any exudate or lesions.   Respiratory: clear to auscultation bilaterally, no wheezing, no crackles. Normal respiratory effort. No accessory muscle use.  Cardiovascular: Regular rate and rhythm, no murmurs / rubs / gallops. No extremity edema. 2+ pedal pulses. No carotid bruits.  Abdomen: Notable epigastric tenderness.  Abdomen is soft..  No evidence of intra-abdominal masses.  Positive bowel sounds noted in all quadrants.   Musculoskeletal: No joint deformity upper and lower extremities. Good ROM, no contractures. Normal muscle tone.    Data Reviewed:  I have personally reviewed and interpreted labs, imaging.  Significant findings are   CBC: Recent Labs  Lab 05/31/23 1140 06/01/23 0609  WBC 15.0* 11.7*  NEUTROABS  --  7.8*  HGB 15.8* 12.9  HCT 50.7* 42.3  MCV 87.7 88.5  PLT 418* 369   Basic Metabolic Panel: Recent Labs  Lab 05/31/23 1140 05/31/23 1545 06/01/23 0609  NA 140  --  133*  K 3.4*  --  4.2  CL 101  --  96*  CO2 21*  --  26  GLUCOSE 180*  --  203*  BUN 15  --  18  CREATININE 0.64  --  0.67  CALCIUM 9.8  --  8.9  MG  --  1.9 2.0   GFR: Estimated Creatinine Clearance: 67.6 mL/min (by C-G formula based on SCr of 0.67 mg/dL). Liver Function Tests: Recent Labs  Lab 05/31/23 1140 06/01/23 0609  AST 23 16  ALT 25 19  ALKPHOS 106 82  BILITOT 1.0 0.9  PROT 8.6* 7.0  ALBUMIN 4.6 3.7    Coagulation Profile: Recent Labs  Lab 06/01/23 0609  INR 1.0     Code Status:  Full code.  Code status decision has been confirmed with: patient Family Communication: Daughters are at the bedside and have been updated on plan of care.   Severity of Illness:  The appropriate patient status for this patient is OBSERVATION. Observation status is judged to be reasonable and  necessary in order to provide the required intensity of service to ensure the patient's safety. The patient's presenting symptoms, physical exam findings, and initial radiographic and laboratory data in the context of their medical condition is felt to place them at decreased risk for further clinical deterioration. Furthermore, it is anticipated that the patient will be medically stable for discharge from the hospital within 2 midnights of admission.   Time spent:  58 minutes  Author:  Marinda Elk MD

## 2023-06-01 NOTE — Hospital Course (Signed)
62 year old female with past medical history of hypertension, hyperlipidemia, gastroesophageal reflux disease, diabetes mellitus type 2 complicated by peripheral polyneuropathy who presented to [facility] emergency department with decline in oral intake over 3 to 4 days followed by complaints of nausea and vomiting for 1 day.  Upon evaluation in the emergency department CT imaging of the abdomen pelvis revealed diffuse wall thickening of the colon concerning for possible inflammatory versus infectious colitis.  Patient was administered intravenous fluids, opiate-based analgesics as well as ciprofloxacin and Flagyl.  Due to patient's ongoing intractable nausea and vomiting and inability to tolerate oral intake hospitalist group was then called to assess the patient for admission to the hospital.

## 2023-06-02 ENCOUNTER — Ambulatory Visit: Payer: BC Managed Care – PPO | Admitting: Physical Therapy

## 2023-06-02 DIAGNOSIS — R112 Nausea with vomiting, unspecified: Secondary | ICD-10-CM | POA: Diagnosis present

## 2023-06-02 DIAGNOSIS — E1169 Type 2 diabetes mellitus with other specified complication: Secondary | ICD-10-CM | POA: Diagnosis present

## 2023-06-02 DIAGNOSIS — Z96652 Presence of left artificial knee joint: Secondary | ICD-10-CM | POA: Diagnosis present

## 2023-06-02 DIAGNOSIS — Z7982 Long term (current) use of aspirin: Secondary | ICD-10-CM | POA: Diagnosis not present

## 2023-06-02 DIAGNOSIS — Z823 Family history of stroke: Secondary | ICD-10-CM | POA: Diagnosis not present

## 2023-06-02 DIAGNOSIS — E872 Acidosis, unspecified: Secondary | ICD-10-CM | POA: Diagnosis present

## 2023-06-02 DIAGNOSIS — E1142 Type 2 diabetes mellitus with diabetic polyneuropathy: Secondary | ICD-10-CM | POA: Diagnosis present

## 2023-06-02 DIAGNOSIS — E878 Other disorders of electrolyte and fluid balance, not elsewhere classified: Secondary | ICD-10-CM | POA: Diagnosis present

## 2023-06-02 DIAGNOSIS — E1143 Type 2 diabetes mellitus with diabetic autonomic (poly)neuropathy: Secondary | ICD-10-CM | POA: Diagnosis present

## 2023-06-02 DIAGNOSIS — K5900 Constipation, unspecified: Secondary | ICD-10-CM | POA: Diagnosis present

## 2023-06-02 DIAGNOSIS — F1290 Cannabis use, unspecified, uncomplicated: Secondary | ICD-10-CM | POA: Diagnosis present

## 2023-06-02 DIAGNOSIS — K219 Gastro-esophageal reflux disease without esophagitis: Secondary | ICD-10-CM | POA: Diagnosis present

## 2023-06-02 DIAGNOSIS — Z7984 Long term (current) use of oral hypoglycemic drugs: Secondary | ICD-10-CM | POA: Diagnosis not present

## 2023-06-02 DIAGNOSIS — I252 Old myocardial infarction: Secondary | ICD-10-CM | POA: Diagnosis not present

## 2023-06-02 DIAGNOSIS — Z7985 Long-term (current) use of injectable non-insulin antidiabetic drugs: Secondary | ICD-10-CM | POA: Diagnosis not present

## 2023-06-02 DIAGNOSIS — Z8249 Family history of ischemic heart disease and other diseases of the circulatory system: Secondary | ICD-10-CM | POA: Diagnosis not present

## 2023-06-02 DIAGNOSIS — E782 Mixed hyperlipidemia: Secondary | ICD-10-CM | POA: Diagnosis present

## 2023-06-02 DIAGNOSIS — E871 Hypo-osmolality and hyponatremia: Secondary | ICD-10-CM | POA: Diagnosis present

## 2023-06-02 DIAGNOSIS — Z955 Presence of coronary angioplasty implant and graft: Secondary | ICD-10-CM | POA: Diagnosis not present

## 2023-06-02 DIAGNOSIS — E876 Hypokalemia: Secondary | ICD-10-CM | POA: Diagnosis present

## 2023-06-02 DIAGNOSIS — Z794 Long term (current) use of insulin: Secondary | ICD-10-CM | POA: Diagnosis not present

## 2023-06-02 DIAGNOSIS — E860 Dehydration: Secondary | ICD-10-CM | POA: Diagnosis present

## 2023-06-02 DIAGNOSIS — Z833 Family history of diabetes mellitus: Secondary | ICD-10-CM | POA: Diagnosis not present

## 2023-06-02 DIAGNOSIS — E1165 Type 2 diabetes mellitus with hyperglycemia: Secondary | ICD-10-CM | POA: Diagnosis present

## 2023-06-02 DIAGNOSIS — I10 Essential (primary) hypertension: Secondary | ICD-10-CM | POA: Diagnosis present

## 2023-06-02 DIAGNOSIS — Z79899 Other long term (current) drug therapy: Secondary | ICD-10-CM | POA: Diagnosis not present

## 2023-06-02 LAB — CBC WITH DIFFERENTIAL/PLATELET
Abs Immature Granulocytes: 0.01 10*3/uL (ref 0.00–0.07)
Basophils Absolute: 0 10*3/uL (ref 0.0–0.1)
Basophils Relative: 1 %
Eosinophils Absolute: 0.1 10*3/uL (ref 0.0–0.5)
Eosinophils Relative: 2 %
HCT: 41.5 % (ref 36.0–46.0)
Hemoglobin: 12.8 g/dL (ref 12.0–15.0)
Immature Granulocytes: 0 %
Lymphocytes Relative: 32 %
Lymphs Abs: 2.4 10*3/uL (ref 0.7–4.0)
MCH: 27.6 pg (ref 26.0–34.0)
MCHC: 30.8 g/dL (ref 30.0–36.0)
MCV: 89.6 fL (ref 80.0–100.0)
Monocytes Absolute: 0.4 10*3/uL (ref 0.1–1.0)
Monocytes Relative: 6 %
Neutro Abs: 4.5 10*3/uL (ref 1.7–7.7)
Neutrophils Relative %: 59 %
Platelets: 321 10*3/uL (ref 150–400)
RBC: 4.63 MIL/uL (ref 3.87–5.11)
RDW: 14 % (ref 11.5–15.5)
WBC: 7.5 10*3/uL (ref 4.0–10.5)
nRBC: 0 % (ref 0.0–0.2)

## 2023-06-02 LAB — COMPREHENSIVE METABOLIC PANEL
ALT: 19 U/L (ref 0–44)
AST: 17 U/L (ref 15–41)
Albumin: 3.6 g/dL (ref 3.5–5.0)
Alkaline Phosphatase: 78 U/L (ref 38–126)
Anion gap: 10 (ref 5–15)
BUN: 14 mg/dL (ref 8–23)
CO2: 27 mmol/L (ref 22–32)
Calcium: 8.8 mg/dL — ABNORMAL LOW (ref 8.9–10.3)
Chloride: 99 mmol/L (ref 98–111)
Creatinine, Ser: 0.56 mg/dL (ref 0.44–1.00)
GFR, Estimated: >60 mL/min (ref >60)
Glucose, Bld: 140 mg/dL — ABNORMAL HIGH (ref 70–99)
Potassium: 3.7 mmol/L (ref 3.5–5.1)
Sodium: 136 mmol/L (ref 135–145)
Total Bilirubin: 0.9 mg/dL (ref 0.3–1.2)
Total Protein: 6.4 g/dL — ABNORMAL LOW (ref 6.5–8.1)

## 2023-06-02 LAB — GLUCOSE, CAPILLARY
Glucose-Capillary: 144 mg/dL — ABNORMAL HIGH (ref 70–99)
Glucose-Capillary: 138 mg/dL — ABNORMAL HIGH (ref 70–99)
Glucose-Capillary: 191 mg/dL — ABNORMAL HIGH (ref 70–99)

## 2023-06-02 LAB — HEMOGLOBIN A1C
Hgb A1c MFr Bld: 8.6 % — ABNORMAL HIGH (ref 4.8–5.6)
Mean Plasma Glucose: 200 mg/dL

## 2023-06-02 LAB — MAGNESIUM: Magnesium: 1.9 mg/dL (ref 1.7–2.4)

## 2023-06-02 LAB — LIPASE, BLOOD: Lipase: 25 U/L (ref 11–51)

## 2023-06-02 MED ORDER — LACTATED RINGERS IV SOLN: INTRAVENOUS | Status: AC

## 2023-06-02 MED ORDER — METOCLOPRAMIDE HCL 5 MG/ML IJ SOLN
10.0000 mg | Freq: Four times a day (QID) | INTRAMUSCULAR | Status: DC
  Administered 2023-06-02 (×2): 10 mg via INTRAVENOUS
  Filled 2023-06-02 (×2): qty 2

## 2023-06-02 MED ORDER — METOCLOPRAMIDE HCL 5 MG/ML IJ SOLN
10.0000 mg | Freq: Four times a day (QID) | INTRAMUSCULAR | Status: DC
  Administered 2023-06-02 – 2023-06-03 (×4): 10 mg via INTRAVENOUS
  Filled 2023-06-02 (×4): qty 2

## 2023-06-02 MED ORDER — FLEET ENEMA 7-19 GM/118ML RE ENEM
1.0000 | ENEMA | Freq: Every day | RECTAL | Status: DC | PRN
  Administered 2023-06-02: 1 via RECTAL
  Filled 2023-06-02: qty 1

## 2023-06-02 MED ORDER — BISACODYL 10 MG RE SUPP
10.0000 mg | Freq: Once | RECTAL | Status: AC
  Administered 2023-06-02: 10 mg via RECTAL
  Filled 2023-06-02: qty 1

## 2023-06-02 MED ORDER — ALUM & MAG HYDROXIDE-SIMETH 200-200-20 MG/5ML PO SUSP
30.0000 mL | ORAL | Status: DC | PRN
  Administered 2023-06-02 (×2): 30 mL via ORAL
  Filled 2023-06-02 (×2): qty 30

## 2023-06-02 MED ORDER — HYDRALAZINE HCL 20 MG/ML IJ SOLN: 10.0000 mg | Freq: Four times a day (QID) | INTRAMUSCULAR | Status: DC | PRN

## 2023-06-02 NOTE — Assessment & Plan Note (Addendum)
Hyperglycemic on arrival with elevated hemoglobin A1c Accu-Cheks before every meal and nightly with sliding scale insulin Reduced regimen of Semglee for now as patient's oral intake is tenuous Hold metformin

## 2023-06-02 NOTE — Assessment & Plan Note (Signed)
   Continue home regimen of Neurontin 

## 2023-06-02 NOTE — Progress Notes (Signed)
[facility] Progress Note  Patient: [name]     ZOX:[id number]  DOA: 05/31/2023   PCP: Georganna Skeans, MD       Brief hospital course: This is a 62 year old female with diabetes mellitus, peripheral diabetic neuropathy, hypertension who presented to the hospital with intractable nausea and vomiting.  She states that she has had poor oral intake as well.  She has abdominal pain which is crampy.  No diarrhea.    Subjective:  Continues to have nausea today but vomiting has stopped.  She is taking some sips of clears but has heartburn and burning in her throat.  She has soreness in her abdomen and occasional cramping.She also has a constant headache and feels dizzy when she tries to get up. Assessment and Plan: Principal Problem:   Intractable nausea and vomiting -Reglan IV 4 times daily along with Maalox appear to be helping and vomiting has now resolved - Will attempt to slowly advance her diet but continue IV fluids as she is not taking much clear liquids yet - Continue PRN Zofran and Compazine - Continue twice daily IV Protonix  Active Problems:   Marijuana use, continuous    Uncontrolled type 2 diabetes mellitus with hyperglycemia, with long-term current use of insulin (HCC) Continue Semglee and sliding scale insulin - Hemoglobin A1c is 8.6  Hyponatremia and hypochloremia - Sodium 133 and chloride 96 on admission was likely secondary to dehydration - Have improved with IV fluids     Code Status: Full Code Consultants: None Level of Care: Level of care: Med-Surg Total time on patient care: 35 minutes DVT prophylaxis:  SCDs Start: 05/31/23 1948     Objective:   Vitals:   06/01/23 1436 06/01/23 2101 06/02/23 0543 06/02/23 1405  BP: (!) 145/86 127/74 130/83 129/78  Pulse:  (!) 104 91 89  Resp:  16 16 18   Temp:  98.1 F (36.7 C) 97.9 F (36.6 C)   TempSrc:  Oral Oral   SpO2:  96% 99% 100%  Weight:   70.2 kg   Height:       Filed Weights   05/31/23  1141 05/31/23 2257 06/02/23 0543  Weight: 71.7 kg 67.6 kg 70.2 kg   Exam: General exam: Appears uncomfortable-keeping her eyes closed throughout the conversation and having difficulty answering some questions HEENT: oral mucosa dry Respiratory system: Clear to auscultation.  Cardiovascular system: S1 & S2 heard  Gastrointestinal system: Abdomen soft, she is tender in the periumbilical area,, nondistended. Normal bowel sounds   Extremities: No cyanosis, clubbing or edema Psychiatry:  Mood & affect appropriate.      CBC: Recent Labs  Lab 05/31/23 1140 06/01/23 0609 06/02/23 0627  WBC 15.0* 11.7* 7.5  NEUTROABS  --  7.8* 4.5  HGB 15.8* 12.9 12.8  HCT 50.7* 42.3 41.5  MCV 87.7 88.5 89.6  PLT 418* 369 321   Basic Metabolic Panel: Recent Labs  Lab 05/31/23 1140 05/31/23 1545 06/01/23 0609 06/02/23 0627  NA 140  --  133* 136  K 3.4*  --  4.2 3.7  CL 101  --  96* 99  CO2 21*  --  26 27  GLUCOSE 180*  --  203* 140*  BUN 15  --  18 14  CREATININE 0.64  --  0.67 0.56  CALCIUM 9.8  --  8.9 8.8*  MG  --  1.9 2.0 1.9   GFR: Estimated Creatinine Clearance: 67.6 mL/min (by C-G formula based on SCr of 0.56 mg/dL).  Scheduled Meds:  alum & mag hydroxide-simeth  30 mL Oral Once   atorvastatin  40 mg Oral Daily   butalbital-acetaminophen-caffeine  2 tablet Oral Once   gabapentin  600 mg Oral TID   insulin aspart  0-9 Units Subcutaneous TID WC   insulin glargine-yfgn  15 Units Subcutaneous Daily   metoCLOPramide (REGLAN) injection  10 mg Intravenous Q6H   metoprolol succinate  50 mg Oral Daily   oxybutynin  10 mg Oral QHS   pantoprazole (PROTONIX) IV  40 mg Intravenous Q12H   Continuous Infusions:  lactated ringers 100 mL/hr at 06/02/23 1645   Imaging and lab data was personally reviewed No results found.  LOS: 0 days   Author: Calvert Cantor  06/02/2023 5:46 PM  To contact [facility]>   Check the care team in [facility] and look for the attending/consulting [facility]  provider listed  Log into [email or web address] and use [place]'s universal password   Go to> "[facility]"  and find provider  If you still have difficulty reaching the provider, please page the [facility] (Director on Call) for the Hospitalists listed on amion

## 2023-06-02 NOTE — Assessment & Plan Note (Addendum)
Continue home regimen of metoprolol As needed intravenous antihypertensives for markedly elevated blood pressure 

## 2023-06-02 NOTE — Assessment & Plan Note (Signed)
Will need to counsel patient on cessation prior to discharge

## 2023-06-02 NOTE — Assessment & Plan Note (Signed)
Patient continuing to experience intractable nausea and vomiting and inability to tolerate oral intake Continuing supportive care with intravenous fluids and antiemetics with occasional opiates for associated abdominal pain CT imaging revealed some bowel wall thickening initially bringing up concern that perhaps there is an underlying infectious colitis.  However this is mild and in the absence of elevated inflammatory markers or fever antibiotics are being held for now. Furthermore patient had a similar presentation in mid 2023 for symptoms improved with supportive care. Urine toxicology screen continues to be positive for marijuana.  At this point I feel the patient's are most likely secondary to either hyperemesis cannabinoid syndrome or diabetic gastroparesis. If patient fails to improve in the next 24 hours will consider reinitiation of antibiotics We will go ahead and place the patient on a trial of Reglan Continue intravenous fluids, antiemetics

## 2023-06-02 NOTE — Assessment & Plan Note (Signed)
Continuing home regimen of daily PPI therapy.  

## 2023-06-02 NOTE — Assessment & Plan Note (Signed)
.   Continuing home regimen of lipid lowering therapy.  

## 2023-06-03 DIAGNOSIS — R112 Nausea with vomiting, unspecified: Secondary | ICD-10-CM | POA: Diagnosis not present

## 2023-06-03 LAB — GLUCOSE, CAPILLARY
Glucose-Capillary: 178 mg/dL — ABNORMAL HIGH (ref 70–99)
Glucose-Capillary: 243 mg/dL — ABNORMAL HIGH (ref 70–99)
Glucose-Capillary: 131 mg/dL — ABNORMAL HIGH (ref 70–99)
Glucose-Capillary: 184 mg/dL — ABNORMAL HIGH (ref 70–99)

## 2023-06-03 MED ORDER — METOCLOPRAMIDE HCL 10 MG PO TABS
10.0000 mg | ORAL_TABLET | Freq: Three times a day (TID) | ORAL | Status: DC
  Administered 2023-06-03 – 2023-06-04 (×4): 10 mg via ORAL
  Filled 2023-06-03 (×4): qty 1

## 2023-06-03 MED ORDER — MAGNESIUM CITRATE PO SOLN
1.0000 | Freq: Once | ORAL | Status: AC
  Administered 2023-06-03: 1 via ORAL
  Filled 2023-06-03: qty 296

## 2023-06-03 MED ORDER — PANTOPRAZOLE SODIUM 40 MG PO TBEC
40.0000 mg | DELAYED_RELEASE_TABLET | Freq: Two times a day (BID) | ORAL | Status: DC
  Administered 2023-06-03 – 2023-06-04 (×2): 40 mg via ORAL
  Filled 2023-06-03: qty 1

## 2023-06-03 MED ORDER — HYDROCODONE-ACETAMINOPHEN 5-325 MG PO TABS
1.0000 | ORAL_TABLET | Freq: Four times a day (QID) | ORAL | Status: DC | PRN
  Administered 2023-06-03 – 2023-06-04 (×2): 1 via ORAL
  Filled 2023-06-03 (×2): qty 1

## 2023-06-03 NOTE — Progress Notes (Signed)
[facility] Progress Note  Patient: [name]     RUE:[id number]  DOA: 05/31/2023   PCP: Georganna Skeans, MD       Brief hospital course: This is a 62 year old female with diabetes mellitus, peripheral diabetic neuropathy, hypertension who presented to the hospital with intractable nausea and vomiting.  She states that she has had poor oral intake as well.  She has abdominal pain which is crampy.  No diarrhea.    Subjective:  Is not eating meals yet but tolerating liquids. She states she is severely constipated Assessment and Plan: Principal Problem:   Intractable nausea and vomiting -Reglan IV 4 times daily along with Maalox appear to be helping - will change Reglan to oral today and continue to follow for improvement in oral intake - Continue PRN Zofran and Compazine - Continue twice daily IV Protonix - add Mag Citrate for constipation  Active Problems:   Marijuana use, continuous    Uncontrolled type 2 diabetes mellitus with hyperglycemia, with long-term current use of insulin (HCC) Continue Semglee and sliding scale insulin - Hemoglobin A1c is 8.6  Hyponatremia and hypochloremia - resolved with IVF       Code Status: Full Code Consultants: None Level of Care: Level of care: Med-Surg Total time on patient care: 35 minutes DVT prophylaxis:  SCDs Start: 05/31/23 1948     Objective:   Vitals:   06/02/23 1405 06/02/23 2110 06/03/23 0503 06/03/23 1345  BP: 129/78 131/79 (!) 141/81 (!) 155/88  Pulse: 89 79 84 81  Resp: 18 18 18    Temp:  98 F (36.7 C) 98 F (36.7 C) 98.2 F (36.8 C)  TempSrc:  Oral Oral Oral  SpO2: 100% 98% 99% 100%  Weight:      Height:       Filed Weights   05/31/23 1141 05/31/23 2257 06/02/23 0543  Weight: 71.7 kg 67.6 kg 70.2 kg   Exam: General exam: Appears comfortable  HEENT: oral mucosa moist Respiratory system: Clear to auscultation.  Cardiovascular system: S1 & S2 heard  Gastrointestinal system: Abdomen soft,  non-tender, nondistended. Normal bowel sounds   Extremities: No cyanosis, clubbing or edema Psychiatry:  Mood & affect appropriate.      CBC: Recent Labs  Lab 05/31/23 1140 06/01/23 0609 06/02/23 0627  WBC 15.0* 11.7* 7.5  NEUTROABS  --  7.8* 4.5  HGB 15.8* 12.9 12.8  HCT 50.7* 42.3 41.5  MCV 87.7 88.5 89.6  PLT 418* 369 321    Basic Metabolic Panel: Recent Labs  Lab 05/31/23 1140 05/31/23 1545 06/01/23 0609 06/02/23 0627  NA 140  --  133* 136  K 3.4*  --  4.2 3.7  CL 101  --  96* 99  CO2 21*  --  26 27  GLUCOSE 180*  --  203* 140*  BUN 15  --  18 14  CREATININE 0.64  --  0.67 0.56  CALCIUM 9.8  --  8.9 8.8*  MG  --  1.9 2.0 1.9    GFR: Estimated Creatinine Clearance: 67.6 mL/min (by C-G formula based on SCr of 0.56 mg/dL).  Scheduled Meds:  atorvastatin  40 mg Oral Daily   gabapentin  600 mg Oral TID   insulin aspart  0-9 Units Subcutaneous TID WC   insulin glargine-yfgn  15 Units Subcutaneous Daily   metoCLOPramide  10 mg Oral TID AC & HS   metoprolol succinate  50 mg Oral Daily   oxybutynin  10 mg Oral QHS   pantoprazole  40 mg Oral BID   Continuous Infusions:   Imaging and lab data was personally reviewed No results found.  LOS: 1 day   Author: Calvert Cantor  06/03/2023 3:32 PM  To contact [facility]>   Check the care team in [facility] and look for the attending/consulting [facility] provider listed  Log into [email or web address] and use [place]'s universal password   Go to> "[facility]"  and find provider  If you still have difficulty reaching the provider, please page the [facility] (Director on Call) for the Hospitalists listed on amion

## 2023-06-04 ENCOUNTER — Telehealth: Payer: Self-pay | Admitting: Family Medicine

## 2023-06-04 ENCOUNTER — Other Ambulatory Visit: Payer: Self-pay | Admitting: Family Medicine

## 2023-06-04 DIAGNOSIS — R112 Nausea with vomiting, unspecified: Secondary | ICD-10-CM | POA: Diagnosis not present

## 2023-06-04 LAB — GLUCOSE, CAPILLARY: Glucose-Capillary: 222 mg/dL — ABNORMAL HIGH (ref 70–99)

## 2023-06-04 MED ORDER — GLUCOSE BLOOD VI STRP: ORAL_STRIP | 12 refills | Status: AC

## 2023-06-04 MED ORDER — PANTOPRAZOLE SODIUM 40 MG PO TBEC: 40.0000 mg | DELAYED_RELEASE_TABLET | Freq: Two times a day (BID) | ORAL | 0 refills | Status: DC

## 2023-06-04 MED ORDER — METOCLOPRAMIDE HCL 10 MG PO TABS: 10.0000 mg | ORAL_TABLET | Freq: Three times a day (TID) | ORAL | 0 refills | Status: DC | PRN

## 2023-06-04 MED ORDER — ACCU-CHEK AVIVA PLUS W/DEVICE KIT: PACK | 0 refills | Status: DC

## 2023-06-04 NOTE — Telephone Encounter (Signed)
Copied from CRM [id number]. Topic: General - Other >> Jun 04, 2023 11:38 AM Macon Large wrote: Reason for CRM: [place] Citizen with [facility] stated they can no longer get the ACCU-CHEK AVIVA PLUS Device KIT. [place] Citizen reports that they have either the Accu-Chek Guide Me or One Touch Verio. Per [place] Citizen, they are not able to fill the Rx until the Rx is changed by the provider.  [facility] [address] [place], [place] - [address] Phone: [phone]  Fax: [phone]

## 2023-06-04 NOTE — Discharge Summary (Signed)
Physician Discharge Summary  [name] WGN:[id number] DOB: 1961/03/06 DOA: 05/31/2023  PCP: Georganna Skeans, MD  Admit date: 05/31/2023 Discharge date: 06/04/2023 Discharging to: home     Discharge Diagnoses:   Principal Problem:   Intractable nausea and vomiting Active Problems:   Marijuana use, continuous   Uncontrolled type 2 diabetes mellitus with hyperglycemia, with long-term current use of insulin (HCC)   Essential hypertension   Mixed hyperlipidemia due to type 2 diabetes mellitus (HCC)   Diabetic peripheral neuropathy associated with type 2 diabetes mellitus (HCC)   GERD without esophagitis   Hyperlipidemia   Hypokalemia   Abdominal pain   SIRS (systemic inflammatory response syndrome) (HCC)   High anion gap metabolic acidosis   Dehydration   Acute prerenal azotemia         Brief hospital course: This is a 62 year old female with diabetes mellitus, peripheral diabetic neuropathy, hypertension who presented to the hospital with intractable nausea and vomiting.  She states that she has had poor oral intake as well.  She has had abdominal pain which is crampy.  No diarrhea.    Assessment and Plan: Principal Problem:   Intractable nausea and vomiting - etiology may be Ozempic use, daily chronic marijauna use or diabetic gastroparesis - I have dicussed this with her in detail and asked her to abstain from Marijuana and hold Ozempic for now - improved with Reglan IV 4 times daily along with Maalox appear to be helping - she has been changed to an oral regimen of Reglan PRN - Continue twice daily Protonix - add Mag Citrate for constipation   Active Problems:   Marijuana use, continuous  - see above    Uncontrolled type 2 diabetes mellitus with hyperglycemia, with long-term current use of insulin (HCC) - treated with Semglee and sliding scale insulin - Hemoglobin A1c is 8.6 - have discussed better dietary control - have not adjusted her oral medication regimen     Hyponatremia and hypochloremia - resolved with IVF      Discharge Instructions  Discharge Instructions     Diet - low sodium heart healthy   Complete by: As directed    Diet Carb Modified   Complete by: As directed    Increase activity slowly   Complete by: As directed       Allergies as of 06/04/2023       Reactions   Oxycodone-acetaminophen Other (See Comments)   Causes her to feel "hot".   Penicillins Rash        Medication List     STOP taking these medications    clindamycin 1 % lotion Commonly known as: CLEOCIN T   Ozempic (2 MG/DOSE) 8 MG/3ML Sopn Generic drug: Semaglutide (2 MG/DOSE)       TAKE these medications    aspirin 81 MG chewable tablet Chew 1 tablet (81 mg total) by mouth 2 (two) times daily.   atorvastatin 40 MG tablet Commonly known as: LIPITOR Take 1 tablet (40 mg total) by mouth daily.   cyanocobalamin 1000 MCG tablet Commonly known as: VITAMIN B12 Take 1 tablet (1,000 mcg total) by mouth daily.   Easy Touch Lancing Device Misc Use to check diabetes 4 times daily  E 11.65   Farxiga 10 MG Tabs tablet Generic drug: dapagliflozin propanediol Take 1 tablet (10 mg total) by mouth daily.   Fiasp FlexTouch 100 UNIT/ML FlexTouch Pen Generic drug: insulin aspart Inject 6-20 Units into the skin 3 (three) times daily before meals. What changed:  how much to take when to take this   FreeStyle Libre 3 Sensor Misc APPLY 1 SENSOR EVERY 14 DAYS   gabapentin 600 MG tablet Commonly known as: NEURONTIN Take 1 tablet (600 mg total) by mouth 3 (three) times daily. What changed: when to take this   glucose blood test strip Commonly known as: IT consultant TEST BLOOD SUGARS 3 TIMES DAILY   HYDROcodone-acetaminophen 5-325 MG tablet Commonly known as: NORCO/VICODIN Take 1 tablet by mouth every 6 (six) hours as needed for moderate pain. What changed: when to take this   HYDROmorphone 2 MG tablet Commonly known as: DILAUDID Take 2  mg by mouth as needed for severe pain.   hydrOXYzine 25 MG tablet Commonly known as: ATARAX Take 1 tablet (25 mg total) by mouth 3 (three) times daily as needed. itching What changed:  when to take this reasons to take this   Insulin Pen Needle 32G X 4 MM Misc Use 4x a day   latanoprost 0.005 % ophthalmic solution Commonly known as: XALATAN Place 1 drop into both eyes at bedtime.   metFORMIN 1000 MG tablet Commonly known as: GLUCOPHAGE Take 1 tablet (1,000 mg total) by mouth daily with supper.   metoCLOPramide 10 MG tablet Commonly known as: REGLAN Take 1 tablet (10 mg total) by mouth every 8 (eight) hours as needed for nausea.   metoprolol succinate 50 MG 24 hr tablet Commonly known as: TOPROL-XL TAKE 1 TABLET BY MOUTH ONCE DAILY. TAKE  WITH  OR  IMMEDIATELY  FOLLOWING  A  MEAL   metroNIDAZOLE 500 MG tablet Commonly known as: Flagyl Take 1 tablet (500 mg total) by mouth 3 (three) times daily.   naloxone 4 MG/0.1ML Liqd nasal spray kit Commonly known as: NARCAN Place 1 spray into the nose as needed (or).   ondansetron 8 MG disintegrating tablet Commonly known as: ZOFRAN-ODT Take 1 tablet (8 mg total) by mouth every 8 (eight) hours as needed for nausea or vomiting.   pantoprazole 40 MG tablet Commonly known as: PROTONIX Take 1 tablet (40 mg total) by mouth 2 (two) times daily. What changed: when to take this   tiZANidine 4 MG tablet Commonly known as: Zanaflex Take 1 tablet (4 mg total) by mouth every 6 (six) hours as needed for muscle spasms. What changed: when to take this   [place] FlexTouch 200 UNIT/ML FlexTouch Pen Generic drug: insulin degludec INJECT 30 TO 34 UNITS SUBCUTANEOUSLY ONCE DAILY Strength: 200 UNIT/ML What changed:  how much to take how to take this when to take this additional instructions   triamcinolone cream 0.1 % Commonly known as: KENALOG Apply 1 Application topically daily.   VITAMIN C PO Take 1 tablet by mouth daily.             The results of significant diagnostics from this hospitalization (including imaging, microbiology, ancillary and laboratory) are listed below for reference.    CT ABDOMEN PELVIS W CONTRAST  Result Date: 05/31/2023 CLINICAL DATA:  Abdominal pain, nausea, vomiting EXAM: CT ABDOMEN AND PELVIS WITH CONTRAST TECHNIQUE: Multidetector CT imaging of the abdomen and pelvis was performed using the standard protocol following bolus administration of intravenous contrast. RADIATION DOSE REDUCTION: This exam was performed according to the departmental dose-optimization program which includes automated exposure control, adjustment of the mA and/or kV according to patient size and/or use of iterative reconstruction technique. CONTRAST:  OMNIPAQUE IOHEXOL 300 MG/ML  SOLN COMPARISON:  07/04/2022 FINDINGS: Lower chest: There is no focal consolidation in  the lower lung fields. Coronary artery calcifications are seen. Hepatobiliary: There is fatty infiltration in liver. There is a 7 mm low-density in the left lobe of liver, possibly cyst or hemangioma with no interval change. There is no dilation of bile ducts. Gallbladder is unremarkable. Pancreas: No focal abnormalities are seen. Spleen: Unremarkable. Adrenals/Urinary Tract: Adrenals are unremarkable. There is no hydronephrosis. There are no renal or ureteral stones. Urinary bladder is unremarkable. Stomach/Bowel: Small hiatal hernia is seen. Small bowel loops are not dilated. Appendix is unremarkable. There is mild diffuse wall thickening in ascending, transverse, descending and sigmoid colon. There is incomplete distention of colon. Few scattered diverticula are seen in colon without signs of focal diverticulitis. Moderate amount of stool is seen in rectum. Vascular/Lymphatic: Scattered arterial calcifications are seen. Reproductive: Uterus appears smaller than usual suggesting atrophy or partial removal. Lobulation seen in the anterior margin of the uterus has  not changed. Other: There is no ascites or pneumoperitoneum. Small umbilical hernia containing fat is seen. Musculoskeletal: No acute findings are seen. IMPRESSION: There is mild diffuse wall thickening in colon which may be due to incomplete distention or suggest nonspecific inflammatory or infectious colitis. There is no loculated pericolic fluid collection. Few diverticula are seen in the colon without signs of focal acute diverticulitis. There is no hydronephrosis.  The appendix is not dilated. Small hiatal hernia. Coronary artery calcifications are seen. Aortic arteriosclerosis. Electronically Signed   By: Ernie Avena M.D.   On: 05/31/2023 14:19   XR Knee 1-2 Views Left  Result Date: 05/27/2023 Radiographs: No acute fracture.  Total arthroplasty components well-seated.  Knee is well located.  No bony abnormalities.  Labs:   Basic Metabolic Panel: Recent Labs  Lab 05/31/23 1140 05/31/23 1545 06/01/23 0609 06/02/23 0627  NA 140  --  133* 136  K 3.4*  --  4.2 3.7  CL 101  --  96* 99  CO2 21*  --  26 27  GLUCOSE 180*  --  203* 140*  BUN 15  --  18 14  CREATININE 0.64  --  0.67 0.56  CALCIUM 9.8  --  8.9 8.8*  MG  --  1.9 2.0 1.9     CBC: Recent Labs  Lab 05/31/23 1140 06/01/23 0609 06/02/23 0627  WBC 15.0* 11.7* 7.5  NEUTROABS  --  7.8* 4.5  HGB 15.8* 12.9 12.8  HCT 50.7* 42.3 41.5  MCV 87.7 88.5 89.6  PLT 418* 369 321         SIGNED:   Calvert Cantor, MD  [facility] 06/04/2023, 6:32 PM

## 2023-06-04 NOTE — Progress Notes (Signed)
Discharge orders placed and explained to patient, patient in shower, ride is on the way up to pick her up and PIV removed, awaiting morning medication administration and patient discharging to lobby. All appointment discussed and questions answered.

## 2023-06-04 NOTE — Telephone Encounter (Signed)
Call into pharmacy and ordered meter ,strip, lancets for patient

## 2023-06-07 ENCOUNTER — Telehealth: Payer: Self-pay

## 2023-06-07 NOTE — Transitions of Care (Post Inpatient/ED Visit) (Signed)
06/07/2023  Name: [name] MRN: [id number] DOB: Apr 17, 1961  Today's TOC FU Call Status: Today's TOC FU Call Status:: Successful TOC FU Call Competed TOC FU Call Complete Date: 06/07/23  Transition Care Management Follow-up Telephone Call Date of Discharge: 06/04/23 Discharge Facility: [facility]) Type of Discharge: Inpatient Admission Primary Inpatient Discharge Diagnosis:: intractable nausea and vomiting. How have you been since you were released from the hospital?: Better Any questions or concerns?: No  Items Reviewed: Did you receive and understand the discharge instructions provided?: Yes Medications obtained,verified, and reconciled?: Partial Review Completed Reason for Partial Mediation Review: She confirmed that she has all of her medications including the reglan and flagyl and she said she is also aware that she is to stop the ozempic. She said she has a Freestyle meter but misplaced it.  She has a standard glucometer but is not sure what brand.  She will need to check and call the office with the brand because she needs more test strips. An Accu-chek Aviva Plus was ordered 06/04/2023 but she stated that her insuranc will not pay for it. Any new allergies since your discharge?: No Dietary orders reviewed?: Yes Type of Diet Ordered:: heart healthy diabetic Do you have support at home?: Yes People in Home: child(ren), adult, grandchild(ren) Name of Support/Comfort Primary Source: has daughter and grandson  Medications Reviewed Today: Medications Reviewed Today     Reviewed by Rulon Sera, CPhT (Pharmacy Technician) on 06/03/23 at 1326  Med List Status: Complete   Medication Order Taking? Sig Documenting Provider Last Dose Status Informant  Ascorbic Acid (VITAMIN C PO) [id number] Yes Take 1 tablet by mouth daily. [provider] Past Week Active Self, Pharmacy Records  aspirin 81 MG chewable tablet [id number] Yes Chew 1 tablet (81 mg total) by mouth 2 (two)  times daily. Kathryne Hitch, MD Past Week Active Self, Pharmacy Records  atorvastatin (LIPITOR) 40 MG tablet [id number] Yes Take 1 tablet (40 mg total) by mouth daily. Georganna Skeans, MD Past Week Active Self, Pharmacy Records  clindamycin (CLEOCIN T) 1 % lotion [id number] Yes Apply 1 Application topically daily. [provider] Past Week Active Self, Pharmacy Records  Continuous Glucose Sensor (FREESTYLE LIBRE 3 SENSOR) MISC [id number]  APPLY 1 SENSOR EVERY 14 DAYS Georganna Skeans, MD  Active Self, Pharmacy Records  cyanocobalamin (VITAMIN B12) 1000 MCG tablet [id number] Yes Take 1 tablet (1,000 mcg total) by mouth daily. Georganna Skeans, MD Past Week Active Self, Pharmacy Records  FARXIGA 10 MG TABS tablet [id number] Yes Take 1 tablet (10 mg total) by mouth daily. Georganna Skeans, MD Past Week Active Self, Pharmacy Records  gabapentin (NEURONTIN) 600 MG tablet [id number] Yes Take 1 tablet (600 mg total) by mouth 3 (three) times daily.  Patient taking differently: Take 600 mg by mouth daily.   Georganna Skeans, MD Past Week Active Self, Pharmacy Records  glucose blood [facility] CONTOUR TEST) test strip [id number]  TEST BLOOD SUGARS 3 TIMES DAILY Georganna Skeans, MD  Active Self, Pharmacy Records  HYDROcodone-acetaminophen (NORCO/VICODIN) 5-325 MG tablet [id number] Yes Take 1 tablet by mouth every 6 (six) hours as needed for moderate pain.  Patient taking differently: Take 1 tablet by mouth as needed for moderate pain.   Kathryne Hitch, MD Past Week Active Self, Pharmacy Records  HYDROmorphone (DILAUDID) 2 MG tablet [id number] Yes Take 2 mg by mouth as needed for severe pain. [provider] Past Week Active Self, Pharmacy Records  hydrOXYzine (ATARAX) 25 MG tablet [id number] Yes Take  1 tablet (25 mg total) by mouth 3 (three) times daily as needed. itching  Patient taking differently: Take 25 mg by mouth as needed for anxiety. itching   Georganna Skeans, MD Past Week Active Self,  Pharmacy Records  insulin aspart (FIASP FLEXTOUCH) 100 UNIT/ML FlexTouch Pen [id number] Yes Inject 6-20 Units into the skin 3 (three) times daily before meals.  Patient taking differently: Inject 32 Units into the skin daily.   Georganna Skeans, MD Past Week Active Self, Pharmacy Records           Med Note (CRUTHIS, Madelon Lips Jun 03, 2023  1:25 PM) Pt is adamant she has only been taking this medication once daily.   insulin degludec (TRESIBA FLEXTOUCH) 200 UNIT/ML FlexTouch Pen [id number] Yes INJECT 30 TO 34 UNITS SUBCUTANEOUSLY ONCE DAILY Strength: 200 UNIT/ML  Patient taking differently: Inject 30-34 Units into the skin daily. Depending on BS   Georganna Skeans, MD Past Week Active Self, Pharmacy Records  Insulin Pen Needle 32G X 4 MM MISC [id number]  Use 4x a day Georganna Skeans, MD  Active Self, Pharmacy Records  Lancet Devices (EASY [facility] LANCING DEVICE) MISC [id number]  Use to check diabetes 4 times daily  E 11.65 Georganna Skeans, MD  Active Self, Pharmacy Records  latanoprost (XALATAN) 0.005 % ophthalmic solution [id number] No Place 1 drop into both eyes at bedtime.  Patient not taking: Reported on 06/03/2023   [provider] Not Taking Active Self, Pharmacy Records  metFORMIN (GLUCOPHAGE) 1000 MG tablet [id number] Yes Take 1 tablet (1,000 mg total) by mouth daily with supper. Georganna Skeans, MD Past Week Active Self, Pharmacy Records  metoprolol succinate (TOPROL-XL) 50 MG 24 hr tablet [id number] No TAKE 1 TABLET BY MOUTH ONCE DAILY. TAKE  WITH  OR  IMMEDIATELY  FOLLOWING  A  MEAL  Patient not taking: Reported on 06/03/2023   Georganna Skeans, MD Not Taking Active Self, Pharmacy Records  naloxone [facility]) nasal spray 4 mg/0.1 mL [id number] Yes Place 1 spray into the nose as needed (or). [provider] never Active Self, Pharmacy Records  Discontinued 05/31/23 1736 (Duplicate)   OZEMPIC, 2 MG/DOSE, 8 MG/3ML SOPN [id number] Yes Inject 2 mg into the skin once a week. Saturday Georganna Skeans, MD 05/29/2023 Active Self, Pharmacy Records  pantoprazole (PROTONIX) 40 MG tablet [id number] Yes Take 1 tablet (40 mg total) by mouth 2 (two) times daily.  Patient taking differently: Take 40 mg by mouth daily.   Osvaldo Shipper, MD Past Week Active Self, Pharmacy Records  tiZANidine (ZANAFLEX) 4 MG tablet [id number] Yes Take 1 tablet (4 mg total) by mouth every 6 (six) hours as needed for muscle spasms.  Patient taking differently: Take 4 mg by mouth daily.   Georganna Skeans, MD Past Week Active Self, Pharmacy Records           Med Note (CRUTHIS, Madelon Lips Jun 03, 2023  1:26 PM) Pt is adamant she is taking this medication once daily.   triamcinolone cream (KENALOG) 0.1 % [id number] Yes Apply 1 Application topically daily. [provider] Past Week Active Self, Pharmacy Records            Home Care and Equipment/Supplies: Were Home Health Services Ordered?: No Any new equipment or medical supplies ordered?: No  Functional Questionnaire: Do you need assistance with bathing/showering or dressing?: No (She said she has a shower chair so she is able to take a shower on her own) Do you need assistance  with meal preparation?: Yes (family assists) Do you need assistance with eating?: No Do you have difficulty maintaining continence: No Do you need assistance with getting out of bed/getting out of a chair/moving?: Yes (She stated she has a cane and walker to use with ambulation. She also continues to receive outpatient PT and has an appointment 06/09/2023) Do you have difficulty managing or taking your medications?: No  Follow up appointments reviewed: PCP Follow-up appointment confirmed?: Yes Date of PCP follow-up appointment?: 06/11/23 Follow-up Provider: Dr Andrey Campanile.  She also has an appointment on 06/28/2023 and she does not want to cancel that . Specialist Hospital Follow-up appointment confirmed?: Yes Date of Specialist follow-up appointment?: 06/22/23 Follow-Up Specialty  Provider:: GI, 06/22/2023- podiatry; 06/23/2023- orthopedics. Do you need transportation to your follow-up appointment?: No Do you understand care options if your condition(s) worsen?: Yes-patient verbalized understanding    SIGNATURE  Robyne Peers, RN

## 2023-06-07 NOTE — Telephone Encounter (Signed)
From the [facility] call:  She confirmed that she has all of her medications including the reglan and flagyl and she said she is also aware that she is to stop the ozempic.   She said she has a Freestyle meter but misplaced it. She has a standard glucometer but is not sure what brand. She will need to check and call the office with the brand because she needs more test strips. An Accu-chek Aviva Plus was ordered 06/04/2023 but she stated that her insurance will not pay for it.    She follows up with Dr Andrey Campanile- 06/11/2023.

## 2023-06-09 ENCOUNTER — Encounter: Payer: Self-pay | Admitting: Physical Therapy

## 2023-06-09 ENCOUNTER — Ambulatory Visit (INDEPENDENT_AMBULATORY_CARE_PROVIDER_SITE_OTHER): Payer: BC Managed Care – PPO | Admitting: Physical Therapy

## 2023-06-09 ENCOUNTER — Other Ambulatory Visit: Payer: Self-pay

## 2023-06-09 DIAGNOSIS — R2681 Unsteadiness on feet: Secondary | ICD-10-CM

## 2023-06-09 DIAGNOSIS — R2689 Other abnormalities of gait and mobility: Secondary | ICD-10-CM | POA: Diagnosis not present

## 2023-06-09 DIAGNOSIS — M25562 Pain in left knee: Secondary | ICD-10-CM | POA: Diagnosis not present

## 2023-06-09 DIAGNOSIS — R600 Localized edema: Secondary | ICD-10-CM

## 2023-06-09 DIAGNOSIS — M6281 Muscle weakness (generalized): Secondary | ICD-10-CM

## 2023-06-09 DIAGNOSIS — M25662 Stiffness of left knee, not elsewhere classified: Secondary | ICD-10-CM

## 2023-06-09 NOTE — Therapy (Signed)
OUTPATIENT PHYSICAL THERAPY LOWER EXTREMITY EVALUATION   Patient Name: [name] MRN: [id number] DOB:March 15, 1961, 62 y.o., female Today's Date: 06/09/2023  END OF SESSION:  PT End of Session - 06/09/23 1343     Visit Number 1    Number of Visits 6    Date for PT Re-Evaluation 07/21/23    Authorization Type [employer] State $72 copay    PT Start Time 1300    PT Stop Time 1347    PT Time Calculation (min) 47 min    Activity Tolerance Patient limited by pain    Behavior During Therapy [facility] for tasks assessed/performed              Past Medical History:  Diagnosis Date   Arthritis    Campylobacter enteritis May 2012   [facility] admission   Carpal tunnel syndrome, bilateral    Diabetes mellitus    Gastritis with bleeding due to alcohol 2010   [facility] admission   GERD (gastroesophageal reflux disease)    Hyperlipidemia    Hypertension    no meds   Menopause    Myocardial infarction (HCC)    Neck pain    Wears glasses    Past Surgical History:  Procedure Laterality Date   CARPAL TUNNEL RELEASE Left 05/14/2014   Procedure: LEFT ULNAR NEUROPLASTY AT ELBOW AND ENDOSCOPIC CARPAL TUNNEL RELEASE;  Surgeon: Jodi Marble, MD;  Location: [facility];  Service: Orthopedics;  Laterality: Left;   CORONARY STENT INTERVENTION N/A 10/09/2019   Procedure: CORONARY STENT INTERVENTION;  Surgeon: Yates Decamp, MD;  Location: [facility];  Service: Cardiovascular;  Laterality: N/A;   CYST EXCISION  1995   under arms   endometrial biopsy  2004   benign   FOOT OSTEOTOMY  2010   right-spurs   LEFT HEART CATH AND CORONARY ANGIOGRAPHY N/A 10/09/2019   Procedure: LEFT HEART CATH AND CORONARY ANGIOGRAPHY;  Surgeon: Yates Decamp, MD;  Location: [facility];  Service: Cardiovascular;  Laterality: N/A;   TONSILLECTOMY     TOTAL KNEE ARTHROPLASTY Left 02/26/2023   Procedure: LEFT TOTAL KNEE ARTHROPLASTY;  Surgeon: Kathryne Hitch, MD;  Location: [facility];  Service:  Orthopedics;  Laterality: Left;  Needs RNFA   ULNAR NERVE TRANSPOSITION Left 05/14/2014   Procedure: ULNAR NERVE DECOMPRESSION/TRANSPOSITION;  Surgeon: Jodi Marble, MD;  Location: [facility];  Service: Orthopedics;  Laterality: Left;   Patient Active Problem List   Diagnosis Date Noted   Mixed hyperlipidemia due to type 2 diabetes mellitus (HCC) 06/02/2023   Marijuana use, continuous 06/02/2023   Diabetic peripheral neuropathy associated with type 2 diabetes mellitus (HCC) 06/02/2023   Intractable vomiting with nausea 06/02/2023   Abdominal pain 06/01/2023   SIRS (systemic inflammatory response syndrome) (HCC) 06/01/2023   High anion gap metabolic acidosis 06/01/2023   Dehydration 06/01/2023   Acute prerenal azotemia 06/01/2023   Arthrofibrosis of total knee arthroplasty, subsequent encounter 04/07/2023   Status post left knee replacement 02/26/2023   Unilateral primary osteoarthritis, left knee 09/09/2022   Intractable nausea and vomiting 07/04/2022   Hematemesis 07/04/2022   Hypokalemia 07/04/2022   Lactic acidosis 07/04/2022   Pain due to onychomycosis of toenails of both feet 04/28/2022   Diabetic neuropathy, type II diabetes mellitus (HCC) 04/28/2022   De Quervain's tenosynovitis, left 11/13/2021   Bilateral injections given May 21, 2020 degenerative arthritis of knee, bilateral 05/21/2020   Traumatic hematoma of female breast 11/29/2019   Dizziness 11/29/2019   Hospital  discharge follow-up 10/23/2019   NSTEMI (non-ST elevated myocardial infarction) (HCC) 10/10/2019   CAD (coronary artery disease) 10/10/2019   Chest pain 10/09/2019   Elevated troponin 10/09/2019   Uncontrolled type 2 diabetes mellitus with hyperglycemia (HCC) 10/09/2019   Obesity (BMI 30.0-34.9) 10/09/2019   Cervical spondylitis with radiculitis (HCC) 08/29/2019   Acute bursitis of left shoulder 01/30/2019   Chronic midline posterior neck pain 06/28/2018   Urgency incontinence 06/28/2018    Degenerative joint disease of knee, left 04/26/2018   Carpal tunnel syndrome on both sides 03/29/2018   Anemia 01/02/2018   Class 1 obesity with serious comorbidity and body mass index (BMI) of 32.0 to 32.9 in adult 07/23/2017   Left hip pain 05/04/2017   Carpal tunnel syndrome on right 12/27/2016   Polyarthralgia 11/09/2016   Uncontrolled type 2 diabetes mellitus with hyperglycemia, with long-term current use of insulin (HCC) 07/07/2016   Visit for preventive health examination 06/21/2016   Increased urinary frequency 06/21/2016   Chronic meniscal tear of knee 08/19/2015   Left knee pain 06/19/2015   Candidiasis of skin 06/18/2015   Genital herpes 07/31/2014   Ulnar nerve compression 09/21/2013   Noncompliance with diet and medication regimen 11/15/2012   Boil, axilla 08/14/2012   Arthritis    Screening for breast cancer 08/04/2011   Screening for colon cancer 08/04/2011   HIDRADENITIS SUPPURATIVA 04/11/2007   GERD without esophagitis 11/17/2006   ENDOMETRIAL POLYP 11/17/2006   LOW BACK PAIN 11/17/2006   FIBROIDS, UTERUS 11/16/2006   Hyperlipidemia 11/16/2006   Essential hypertension 11/16/2006    PCP: Georganna Skeans, MD  REFERRING PROVIDER: Kirtland Bouchard, PA-C  REFERRING DIAG: [phone] (ICD-10-CM) - Status post left knee replacement  THERAPY DIAG:  1. Acute pain of left knee  2. Stiffness of left knee, not elsewhere classified  3. Muscle weakness (generalized)  4. Other abnormalities of gait and mobility  5. Unsteadiness on feet  6. Localized edema    Rationale for Evaluation and Treatment: Rehabilitation  ONSET DATE: 02/26/23 (DOS)  SUBJECTIVE:   SUBJECTIVE STATEMENT: Pt s/p Lt TKA on 02/26/23. She completed PT and was discharged due to plateau in knee ROM but then was hospitalized from 06/04/23 and has regressed some she feels like. PA has recommended more therapy and referred her back to PT for balance/gait training as well. Marland Kitchen PERTINENT HISTORY: OA, DM,  HLD, HTN, MI s/p stent  PAIN:  Are you having pain? Yes: NPRS scale: 9 currently/10 Pain location: Lt knee, the entire knee Pain description: throbbing, sore Aggravating factors: walking Relieving factors: resting it,  medications  PRECAUTIONS: Fall  WEIGHT BEARING RESTRICTIONS: No  FALLS:  Has patient fallen in last 6 months? Yes. 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    PALPATION:  LOWER EXTREMITY ROM:  ROM Left Eval In sitting  Knee flexion P: 96 A: 90  Knee extension P:10 A: 15   (Blank rows = not tested)  LOWER EXTREMITY MMT:    03/15/23: Not formally tested but grossly 2/5  Lt knee  MMT Right eval Left eval  Knee flexion  4  Knee extension  4   (Blank rows = not tested)  FUNCTIONAL TESTS:     [facility] PT Assessment - 06/09/23 0001       Standardized Balance Assessment   Standardized Balance Assessment Berg Balance Test      Berg Balance Test   Sit to Stand Able to stand  independently using hands    Standing Unsupported Able to stand 2 minutes with supervision    Sitting with Back Unsupported but Feet Supported on Floor or Stool Able to sit safely and securely 2 minutes    Stand to Sit Sits safely with minimal use of hands    Transfers Able to transfer safely, minor use of hands    Standing Unsupported with Eyes Closed Able to stand 10 seconds with supervision    Standing Unsupported with Feet Together Able to place feet together independently  and stand 1 minute safely    From Standing, Reach Forward with Outstretched Arm Can reach forward >12 cm safely (5")    From Standing Position, Pick up Object from Floor Able to pick up shoe, needs supervision    From Standing Position, Turn to Look Behind Over each Shoulder Looks behind from both sides and weight shifts well    Turn 360 Degrees Able to turn 360 degrees safely but slowly    Standing Unsupported, Alternately Place Feet on Step/Stool Able to complete >2 steps/needs minimal assist    Standing Unsupported, One Foot in Front Able to take small step independently and hold 30 seconds    Standing on One Leg Able to lift leg independently and hold equal to or more than 3 seconds    Total Score 42             GAIT: Eval Distance walked: 100' Assistive device utilized: Environmental consultant - 2 wheeled Level of assistance: Mod I Comments: antalgic gait, decreased speed   TODAY'S TREATMENT:                                                                                                                              DATE:  06/09/23   See HEP - performed trial reps with mod cues needed BERG balance assessment Nu step L5 X 8.5 minutes   PATIENT EDUCATION:  Education details: HEP Person educated: Patient Education method: Explanation, Demonstration, and Handouts Education comprehension: verbalized understanding, returned demonstration, and needs further education  HOME EXERCISE PROGRAM: Access Code: [email or web address] URL: [email or web address] Date: 06/09/2023 Prepared by: Ivery Quale  Exercises - Supine Heel Slide with Strap  - 5-10 x daily - 7 x weekly - 1 sets - 5-10 reps - Seated Long Arc Quad (Mirrored)  - 3-5 x daily - 7 x weekly - 1-2 sets - 10 reps - 2 hold - Seated Knee Flexion Extension AAROM with Overpressure (Mirrored)  - 5-6 x daily - 7 x weekly - 1 sets -  5 reps - 10 hold - Seated Straight Leg Heel Taps  - 1-2 x daily - 7 x weekly - 3 sets - 5-10 reps - Tandem  Stance  - 2 x daily - 6 x weekly - 1 sets - 3 reps - 30 sec hold - Step Taps on High Step  - 2 x daily - 6 x weekly - 1 sets - 10 reps - Feet Together Balance at [employer] Eyes Closed  - 2 x daily - 6 x weekly - 1 sets - 3 reps - 15 sec hold  ASSESSMENT:  CLINICAL IMPRESSION: Patient is a 62 y.o. female who was seen today for physical therapy evaluation and treatment for Lt TKA on 02/26/23.  She had extensive PT at this facility post op and PA has referred her back to PT to also include gait and balance. She demonstrates decreased strength and ROM, pain and gait abnormalities expected post op.  Her balance tests place her at an increased risk of falling so I did add in balance exercises to HEP. She will benefit from PT to address deficits listed.    OBJECTIVE IMPAIRMENTS: Abnormal gait, decreased balance, decreased endurance, decreased mobility, difficulty walking, decreased ROM, decreased strength, hypomobility, increased edema, increased fascial restrictions, increased muscle spasms, and pain.   ACTIVITY LIMITATIONS: carrying, lifting, bending, sitting, standing, squatting, sleeping, stairs, transfers, bed mobility, toileting, dressing, and locomotion level  PARTICIPATION LIMITATIONS: meal prep, cleaning, laundry, driving, shopping, community activity, and occupation  PERSONAL FACTORS: 3+ comorbidities: OA, DM, HLD, HTN, MI s/p stent  are also affecting patient's functional outcome.   REHAB POTENTIAL: Fair elevated pain, limited ROM and tolerance to activity  CLINICAL DECISION MAKING: Evolving/moderate complexity  EVALUATION COMPLEXITY: Moderate   GOALS: Goals reviewed with patient? Yes  LONG TERM GOALS: Target date: 07/21/2023    Independent with final HEP including balance exercises Goal status: INITIAL   2.  Report pain < 5/10 with standing and walking for improved function Goal status: INITIAL  3.  Demonstrate ability to amb with LRAD modified independent for improved  mobility Goal status: INITIAL  4. Improve BERG balance test to 46 or better to show improved balance.     PLAN:  PT FREQUENCY:  1-2/wk  PT DURATION: 6 weeks  PLANNED INTERVENTIONS: Therapeutic exercises, Therapeutic activity, Neuromuscular re-education, Balance training, Gait training, Patient/Family education, Self Care, Joint mobilization, Stair training, DME instructions, Aquatic Therapy, Dry Needling, Electrical stimulation, Cryotherapy, Moist heat, Taping, Vasopneumatic device, Manual therapy, and Re-evaluation  PLAN FOR NEXT SESSION:  review HEP, aggressive ROM, focus as tolerated, quad strengthening, gait and balance.  Ivery Quale, PT, DPT 06/09/23 1:46 PM

## 2023-06-10 ENCOUNTER — Ambulatory Visit: Payer: BC Managed Care – PPO | Admitting: Orthopaedic Surgery

## 2023-06-10 NOTE — Telephone Encounter (Signed)
Copied from CRM [id number]. Topic: General - Other >> Jun 10, 2023 12:32 PM [place] B wrote: Reason for CRM: Farrel Gordon from [facility] , states needs updated chart notes, pertaining to pt needing continous glucose monitor. Fx #[phone]

## 2023-06-11 ENCOUNTER — Encounter: Payer: Self-pay | Admitting: Family Medicine

## 2023-06-11 ENCOUNTER — Ambulatory Visit (INDEPENDENT_AMBULATORY_CARE_PROVIDER_SITE_OTHER): Payer: BC Managed Care – PPO | Admitting: Family Medicine

## 2023-06-11 VITALS — BP 102/65 | HR 92 | Temp 98.0°F | Resp 16 | Ht 64.0 in | Wt 150.8 lb

## 2023-06-11 DIAGNOSIS — Z794 Long term (current) use of insulin: Secondary | ICD-10-CM

## 2023-06-11 DIAGNOSIS — I10 Essential (primary) hypertension: Secondary | ICD-10-CM

## 2023-06-11 DIAGNOSIS — E1169 Type 2 diabetes mellitus with other specified complication: Secondary | ICD-10-CM

## 2023-06-11 DIAGNOSIS — Z09 Encounter for follow-up examination after completed treatment for conditions other than malignant neoplasm: Secondary | ICD-10-CM

## 2023-06-11 DIAGNOSIS — Z7984 Long term (current) use of oral hypoglycemic drugs: Secondary | ICD-10-CM

## 2023-06-11 NOTE — Telephone Encounter (Signed)
Please send updated OV notes from [name] diabetic supplies in order to fill her glucose monitor.

## 2023-06-11 NOTE — Progress Notes (Signed)
Pt is here for hos f/u   Complaining of ankle "wabbling" when she walks, worse on right side

## 2023-06-14 NOTE — Telephone Encounter (Signed)
Sent  06/14/2023

## 2023-06-15 ENCOUNTER — Encounter: Payer: Self-pay | Admitting: Physical Therapy

## 2023-06-15 ENCOUNTER — Ambulatory Visit (INDEPENDENT_AMBULATORY_CARE_PROVIDER_SITE_OTHER): Payer: BC Managed Care – PPO | Admitting: Physical Therapy

## 2023-06-15 DIAGNOSIS — R2681 Unsteadiness on feet: Secondary | ICD-10-CM

## 2023-06-15 DIAGNOSIS — M25562 Pain in left knee: Secondary | ICD-10-CM

## 2023-06-15 DIAGNOSIS — R2689 Other abnormalities of gait and mobility: Secondary | ICD-10-CM | POA: Diagnosis not present

## 2023-06-15 DIAGNOSIS — M6281 Muscle weakness (generalized): Secondary | ICD-10-CM | POA: Diagnosis not present

## 2023-06-15 DIAGNOSIS — M25662 Stiffness of left knee, not elsewhere classified: Secondary | ICD-10-CM

## 2023-06-15 NOTE — Therapy (Signed)
OUTPATIENT PHYSICAL THERAPY LOWER EXTREMITY TREATMENT   Patient Name: CLAUDIA GREENLEY MRN: [id number] DOB:1961/08/14, 62 y.o., female Today's Date: 06/15/2023  END OF SESSION:  PT End of Session - 06/15/23 0805     Visit Number 2    Number of Visits 6    Date for PT Re-Evaluation 07/21/23    Authorization Type [employer] State $72 copay    PT Start Time 0800    PT Stop Time 0850    PT Time Calculation (min) 50 min    Activity Tolerance Patient limited by pain    Behavior During Therapy [facility] for tasks assessed/performed               Past Medical History:  Diagnosis Date   Arthritis    Campylobacter enteritis May 2012   [facility] admission   Carpal tunnel syndrome, bilateral    Diabetes mellitus    Gastritis with bleeding due to alcohol 2010   [facility] admission   GERD (gastroesophageal reflux disease)    Hyperlipidemia    Hypertension    no meds   Menopause    Myocardial infarction (HCC)    Neck pain    Wears glasses    Past Surgical History:  Procedure Laterality Date   CARPAL TUNNEL RELEASE Left 05/14/2014   Procedure: LEFT ULNAR NEUROPLASTY AT ELBOW AND ENDOSCOPIC CARPAL TUNNEL RELEASE;  Surgeon: Jodi Marble, MD;  Location: [facility];  Service: Orthopedics;  Laterality: Left;   CORONARY STENT INTERVENTION N/A 10/09/2019   Procedure: CORONARY STENT INTERVENTION;  Surgeon: Yates Decamp, MD;  [facility]: [facility];  Service: Cardiovascular;  Laterality: N/A;   CYST EXCISION  1995   under arms   endometrial biopsy  2004   benign   FOOT OSTEOTOMY  2010   right-spurs   LEFT HEART CATH AND CORONARY ANGIOGRAPHY N/A 10/09/2019   Procedure: LEFT HEART CATH AND CORONARY ANGIOGRAPHY;  Surgeon: Yates Decamp, MD;  [facility]: [facility];  Service: Cardiovascular;  Laterality: N/A;   TONSILLECTOMY     TOTAL KNEE ARTHROPLASTY Left 02/26/2023   Procedure: LEFT TOTAL KNEE ARTHROPLASTY;  Surgeon: Kathryne Hitch, MD;  Location: [facility];  Service:  Orthopedics;  Laterality: Left;  Needs RNFA   ULNAR NERVE TRANSPOSITION Left 05/14/2014   Procedure: ULNAR NERVE DECOMPRESSION/TRANSPOSITION;  Surgeon: Jodi Marble, MD;  Location: [facility];  Service: Orthopedics;  Laterality: Left;   Patient Active Problem List   Diagnosis Date Noted   Mixed hyperlipidemia due to type 2 diabetes mellitus (HCC) 06/02/2023   Marijuana use, continuous 06/02/2023   Diabetic peripheral neuropathy associated with type 2 diabetes mellitus (HCC) 06/02/2023   Intractable vomiting with nausea 06/02/2023   Abdominal pain 06/01/2023   SIRS (systemic inflammatory response syndrome) (HCC) 06/01/2023   High anion gap metabolic acidosis 06/01/2023   Dehydration 06/01/2023   Acute prerenal azotemia 06/01/2023   Arthrofibrosis of total knee arthroplasty, subsequent encounter 04/07/2023   Status post left knee replacement 02/26/2023   Unilateral primary osteoarthritis, left knee 09/09/2022   Intractable nausea and vomiting 07/04/2022   Hematemesis 07/04/2022   Hypokalemia 07/04/2022   Lactic acidosis 07/04/2022   Pain due to onychomycosis of toenails of both feet 04/28/2022   Diabetic neuropathy, type II diabetes mellitus (HCC) 04/28/2022   De Quervain's tenosynovitis, left 11/13/2021   Bilateral injections given May 21, 2020 degenerative arthritis of knee, bilateral 05/21/2020   Traumatic hematoma of female breast 11/29/2019   Dizziness 11/29/2019  Hospital discharge follow-up 10/23/2019   NSTEMI (non-ST elevated myocardial infarction) (HCC) 10/10/2019   CAD (coronary artery disease) 10/10/2019   Chest pain 10/09/2019   Elevated troponin 10/09/2019   Uncontrolled type 2 diabetes mellitus with hyperglycemia (HCC) 10/09/2019   Obesity (BMI 30.0-34.9) 10/09/2019   Cervical spondylitis with radiculitis (HCC) 08/29/2019   Acute bursitis of left shoulder 01/30/2019   Chronic midline posterior neck pain 06/28/2018   Urgency incontinence 06/28/2018    Degenerative joint disease of knee, left 04/26/2018   Carpal tunnel syndrome on both sides 03/29/2018   Anemia 01/02/2018   Class 1 obesity with serious comorbidity and body mass index (BMI) of 32.0 to 32.9 in adult 07/23/2017   Left hip pain 05/04/2017   Carpal tunnel syndrome on right 12/27/2016   Polyarthralgia 11/09/2016   Uncontrolled type 2 diabetes mellitus with hyperglycemia, with long-term current use of insulin (HCC) 07/07/2016   Visit for preventive health examination 06/21/2016   Increased urinary frequency 06/21/2016   Chronic meniscal tear of knee 08/19/2015   Left knee pain 06/19/2015   Candidiasis of skin 06/18/2015   Genital herpes 07/31/2014   Ulnar nerve compression 09/21/2013   Noncompliance with diet and medication regimen 11/15/2012   Boil, axilla 08/14/2012   Arthritis    Screening for breast cancer 08/04/2011   Screening for colon cancer 08/04/2011   HIDRADENITIS SUPPURATIVA 04/11/2007   GERD without esophagitis 11/17/2006   ENDOMETRIAL POLYP 11/17/2006   LOW BACK PAIN 11/17/2006   FIBROIDS, UTERUS 11/16/2006   Hyperlipidemia 11/16/2006   Essential hypertension 11/16/2006    PCP: Georganna Skeans, MD  REFERRING PROVIDER: Kathryne Hitch*  REFERRING DIAG: I96.789 (ICD-10-CM) - Status post left knee replacement  THERAPY DIAG:  1. Acute pain of left knee  2. Stiffness of left knee, not elsewhere classified  3. Muscle weakness (generalized)  4. Other abnormalities of gait and mobility  5. Unsteadiness on feet     Rationale for Evaluation and Treatment: Rehabilitation  ONSET DATE: 02/26/23 (DOS)  SUBJECTIVE:   SUBJECTIVE STATEMENT: She has [employer] now and goes [employer]. She sits in sauna and walks in pool for hour.  After that she does stepper.    PERTINENT HISTORY: OA, DM, HLD, HTN, MI s/p stent  PAIN:  Are you having pain?  Yes: NPRS scale: this morning 6/10, since last PT ranging 4/10 - 7/10 Pain location: Lt knee, the entire  knee Pain description: throbbing, sore Aggravating factors: walking Relieving factors: resting it,  medications  PRECAUTIONS: Fall  WEIGHT BEARING RESTRICTIONS: No  FALLS:  Has patient fallen in last 6 months? Yes. 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    PALPATION:  LOWER EXTREMITY ROM:  ROM Left Eval In sitting  Knee flexion P: 96 A: 90  Knee extension P:10 A: 15   (Blank rows = not tested)  LOWER EXTREMITY MMT:    03/15/23: Not formally tested but grossly 2/5 Lt knee  MMT Right eval Left eval  Knee flexion  4  Knee extension  4   (Blank rows = not tested)  FUNCTIONAL TESTS:       GAIT: Eval Distance walked: 100' Assistive device utilized: Environmental consultant - 2 wheeled Level of assistance: Mod I Comments: antalgic gait, decreased speed   TODAY'S TREATMENT:                                                                                                                              DATE:  06/15/2023: Therapeutic Exercise: Nustep with BLEs & BUEs moving seat closer for range: first minute of ea seat setting slow stretch, then 2 minutes walking pace,  seat 7 for 3 min, seat 6 for 3 min, then seat 5 for 3 min.  PT demo & verbal cues on using this progression at [facility]. Pt verbalized understanding.  PT reviewed HEP Seated LAQ using strap to assist end range ext. PT demo & verbal cues on modification. Pt return demo 10 reps & verbalized understanding. Seated left  knee flexion assisted with RLE for increased range. 10 sec hold. 5 reps. Seated LLE SLR 10 reps with PT demo & verbal cues on technique. Tandem stance with LLE in front & in back 30 sec ea 1 rep in clinic & cues for 3 reps at home. Standing alternate tapping 8" step 1 min. Standing with feet together eyes closed with intermittent touch counter for 30 sec 2 reps Supine heel slide with 18" ball & strap max flexion 5 sec hold & max ext / leg press 5 sec hold 10 reps. Pt verbalized better understanding of HEP.   Vaso to left knee medium compression 34* 10 min with elevation  06/09/2023   See HEP - performed trial reps with mod cues needed BERG balance assessment Nu step L5 X 8.5 minutes   PATIENT EDUCATION:  Education details: HEP Person educated: Patient Education method: Explanation, Demonstration, and Handouts Education comprehension: verbalized understanding, returned demonstration, and needs further education  HOME EXERCISE PROGRAM: Access Code: [email or web address] URL: [email or web address] Date: 06/09/2023 Prepared by: Ivery Quale  Exercises - Supine Heel Slide with Strap  - 5-10 x daily - 7 x weekly - 1 sets - 5-10 reps - Seated Long Arc Quad (Mirrored)  - 3-5 x daily - 7 x weekly - 1-2 sets - 10 reps - 2 hold - Seated Knee Flexion Extension AAROM with Overpressure (Mirrored)  - 5-6 x daily - 7 x weekly - 1 sets - 5 reps - 10 hold - Seated Straight Leg Heel Taps  - 1-2 x daily - 7 x weekly - 3 sets - 5-10 reps - Tandem Stance  - 2 x daily - 6 x weekly - 1 sets - 3 reps - 30 sec hold - Step Taps on High Step  - 2 x daily - 6 x weekly - 1 sets - 10 reps - Feet Together Balance at [employer] Eyes Closed  - 2 x daily - 6 x weekly - 1 sets -  3 reps - 15 sec hold  ASSESSMENT:  CLINICAL IMPRESSION: PT instructed in using stepper for range by moving seat forward at [facility] which she appears to understand. PT reviewed HEP with addition of strap assist end range for LAQ and ball  for supine heel slide which she appears to understand.   OBJECTIVE IMPAIRMENTS: Abnormal gait, decreased balance, decreased endurance, decreased mobility, difficulty walking, decreased ROM, decreased strength, hypomobility, increased edema, increased fascial restrictions, increased muscle spasms, and pain.   ACTIVITY LIMITATIONS: carrying, lifting, bending, sitting, standing, squatting, sleeping, stairs, transfers, bed mobility, toileting, dressing, and locomotion level  PARTICIPATION LIMITATIONS: meal prep, cleaning, laundry, driving, shopping, community activity, and occupation  PERSONAL FACTORS: 3+ comorbidities: OA, DM, HLD, HTN, MI s/p stent  are also affecting patient's functional outcome.   REHAB POTENTIAL: Fair elevated pain, limited ROM and tolerance to activity  CLINICAL DECISION MAKING: Evolving/moderate complexity  EVALUATION COMPLEXITY: Moderate   GOALS: Goals reviewed with patient? Yes  LONG TERM GOALS: Target date: 07/21/2023    Independent with final HEP including balance exercises Goal status: Ongoing 06/15/2023   2.  Report pain < 5/10 with standing and walking for improved function Goal status: Ongoing 06/15/2023  3.  Demonstrate ability to amb with LRAD modified independent for improved mobility Goal status: Ongoing 06/15/2023  4. Improve BERG balance test to 46 or better to show improved balance.  Goal status: Ongoing 06/15/2023    PLAN:  PT FREQUENCY:  1-2/wk  PT DURATION: 6 weeks  PLANNED INTERVENTIONS: Therapeutic exercises, Therapeutic activity, Neuromuscular re-education, Balance training, Gait training, Patient/Family education, Self Care, Joint mobilization, Stair training, DME instructions, Aquatic Therapy, Dry Needling, Electrical stimulation, Cryotherapy, Moist heat, Taping, Vasopneumatic device, Manual therapy, and Re-evaluation  PLAN FOR NEXT SESSION:  aquatic therapy to instruct in pool exercises, aggressive ROM, focus as tolerated, quad  strengthening, gait and balance.   Vladimir Faster, PT, DPT 06/15/2023, 8:42 AM

## 2023-06-16 ENCOUNTER — Encounter: Payer: Self-pay | Admitting: Family Medicine

## 2023-06-16 NOTE — Progress Notes (Signed)
Established Patient Office Visit  Subjective    Patient ID: [name], female    DOB: [birth year]  Age: 62 y.o. MRN: [id number]  CC: No chief complaint on file.   HPI RAIMA GEATHERS presents for hospital discharge follow up. Patient reports continued improvement since discharge. Patient denies acute complaints.    Outpatient Encounter Medications as of 06/11/2023  Medication Sig   Ascorbic Acid (VITAMIN C PO) Take 1 tablet by mouth daily.   aspirin 81 MG chewable tablet Chew 1 tablet (81 mg total) by mouth 2 (two) times daily.   atorvastatin (LIPITOR) 40 MG tablet Take 1 tablet (40 mg total) by mouth daily.   Blood Glucose Monitoring Suppl (ACCU-CHEK AVIVA PLUS) w/Device KIT Use to check blood sugar 2 times a day   Continuous Glucose Sensor (FREESTYLE LIBRE 3 SENSOR) MISC APPLY 1 SENSOR EVERY 14 DAYS   cyanocobalamin (VITAMIN B12) 1000 MCG tablet Take 1 tablet (1,000 mcg total) by mouth daily.   FARXIGA 10 MG TABS tablet Take 1 tablet (10 mg total) by mouth daily.   gabapentin (NEURONTIN) 600 MG tablet Take 1 tablet (600 mg total) by mouth 3 (three) times daily. (Patient taking differently: Take 600 mg by mouth daily.)   glucose blood (BAYER CONTOUR TEST) test strip TEST BLOOD SUGARS 3 TIMES DAILY   glucose blood test strip Use to check blood sugar 2 times a day   HYDROcodone-acetaminophen (NORCO/VICODIN) 5-325 MG tablet Take 1 tablet by mouth every 6 (six) hours as needed for moderate pain. (Patient taking differently: Take 1 tablet by mouth as needed for moderate pain.)   HYDROmorphone (DILAUDID) 2 MG tablet Take 2 mg by mouth as needed for severe pain.   hydrOXYzine (ATARAX) 25 MG tablet Take 1 tablet (25 mg total) by mouth 3 (three) times daily as needed. itching (Patient taking differently: Take 25 mg by mouth as needed for anxiety. itching)   insulin aspart (FIASP FLEXTOUCH) 100 UNIT/ML FlexTouch Pen Inject 6-20 Units into the skin 3 (three) times daily before meals. (Patient  taking differently: Inject 32 Units into the skin daily.)   insulin degludec (TRESIBA FLEXTOUCH) 200 UNIT/ML FlexTouch Pen INJECT 30 TO 34 UNITS SUBCUTANEOUSLY ONCE DAILY Strength: 200 UNIT/ML (Patient taking differently: Inject 30-34 Units into the skin daily. Depending on BS)   Insulin Pen Needle 32G X 4 MM MISC Use 4x a day   Lancet Devices (EASY TOUCH LANCING DEVICE) MISC Use to check diabetes 4 times daily  E 11.65   latanoprost (XALATAN) 0.005 % ophthalmic solution Place 1 drop into both eyes at bedtime.   metFORMIN (GLUCOPHAGE) 1000 MG tablet Take 1 tablet (1,000 mg total) by mouth daily with supper.   metoCLOPramide (REGLAN) 10 MG tablet Take 1 tablet (10 mg total) by mouth every 8 (eight) hours as needed for nausea.   metoprolol succinate (TOPROL-XL) 50 MG 24 hr tablet TAKE 1 TABLET BY MOUTH ONCE DAILY. TAKE  WITH  OR  IMMEDIATELY  FOLLOWING  A  MEAL   metroNIDAZOLE (FLAGYL) 500 MG tablet Take 1 tablet (500 mg total) by mouth 3 (three) times daily.   naloxone (NARCAN) nasal spray 4 mg/0.1 mL Place 1 spray into the nose as needed (or).   ondansetron (ZOFRAN-ODT) 8 MG disintegrating tablet Take 1 tablet (8 mg total) by mouth every 8 (eight) hours as needed for nausea or vomiting.   pantoprazole (PROTONIX) 40 MG tablet Take 1 tablet (40 mg total) by mouth 2 (two) times daily.  tiZANidine (ZANAFLEX) 4 MG tablet Take 1 tablet (4 mg total) by mouth every 6 (six) hours as needed for muscle spasms. (Patient taking differently: Take 4 mg by mouth daily.)   triamcinolone cream (KENALOG) 0.1 % Apply 1 Application topically daily.   No facility-administered encounter medications on file as of 06/11/2023.    Past Medical History:  Diagnosis Date   Arthritis    Campylobacter enteritis May 2012   [facility] admission   Carpal tunnel syndrome, bilateral    Diabetes mellitus    Gastritis with bleeding due to alcohol 2010   [facility] admission   GERD (gastroesophageal reflux disease)    Hyperlipidemia     Hypertension    no meds   Menopause    Myocardial infarction [facility])    Neck pain    Wears glasses     Past Surgical History:  Procedure Laterality Date   CARPAL TUNNEL RELEASE Left 05/14/2014   Procedure: LEFT ULNAR NEUROPLASTY AT ELBOW AND ENDOSCOPIC CARPAL TUNNEL RELEASE;  Surgeon: Jodi Marble, MD;  Location: [facility];  Service: Orthopedics;  Laterality: Left;   CORONARY STENT INTERVENTION N/A 10/09/2019   Procedure: CORONARY STENT INTERVENTION;  Surgeon: Yates Decamp, MD;  [facility];  Service: Cardiovascular;  Laterality: N/A;   CYST EXCISION  1995   under arms   endometrial biopsy  2004   benign   FOOT OSTEOTOMY  2010   right-spurs   LEFT HEART CATH AND CORONARY ANGIOGRAPHY N/A 10/09/2019   Procedure: LEFT HEART CATH AND CORONARY ANGIOGRAPHY;  Surgeon: Yates Decamp, MD;  [facility];  Service: Cardiovascular;  Laterality: N/A;   TONSILLECTOMY     TOTAL KNEE ARTHROPLASTY Left 02/26/2023   Procedure: LEFT TOTAL KNEE ARTHROPLASTY;  Surgeon: Kathryne Hitch, MD;  Location: [facility];  Service: Orthopedics;  Laterality: Left;  Needs RNFA   ULNAR NERVE TRANSPOSITION Left 05/14/2014   Procedure: ULNAR NERVE DECOMPRESSION/TRANSPOSITION;  Surgeon: Jodi Marble, MD;  Location: [facility];  Service: Orthopedics;  Laterality: Left;    Family History  Problem Relation Age of Onset   Hypertension Mother    Diabetes Mother    Heart attack Mother    Stroke Father    Diabetes Father    Diabetes Maternal Grandmother    Hypertension Maternal Grandmother    Heart attack Sister     Social History   Socioeconomic History   Marital status: Single    Spouse name: Not on file   Number of children: 2   Years of education: 12   Highest education level: High school graduate  Occupational History   Not on file  Tobacco Use   Smoking status: Never   Smokeless tobacco: Never  Vaping Use   Vaping status: Never Used   Substance and Sexual Activity   Alcohol use: Not Currently   Drug use: Yes    Types: Marijuana    Comment: sometimes   Sexual activity: Yes    Birth control/protection: None    Comment: same partner x 10 years  Other Topics Concern   Not on file  Social History Narrative   She works as a Arboriculturist.   Right-handed.   One cup caffeine per day.   She lives at home with daughter.   Social Determinants of Health   Financial Resource Strain: Not on file  Food Insecurity: No Food Insecurity (05/31/2023)   Hunger Vital Sign    Worried About Running Out of Food  in the Last Year: Never true    Ran Out of Food in the Last Year: Never true  Transportation Needs: No Transportation Needs (05/31/2023)   PRAPARE - Administrator, Civil Service (Medical): No    Lack of Transportation (Non-Medical): No  Physical Activity: Not on file  Stress: Not on file  Social Connections: Not on file  Intimate Partner Violence: Not At Risk (05/31/2023)   Humiliation, Afraid, Rape, and Kick questionnaire    Fear of Current or Ex-Partner: No    Emotionally Abused: No    Physically Abused: No    Sexually Abused: No    Review of Systems  All other systems reviewed and are negative.       Objective    BP 102/65 (BP Location: Left Arm, Patient Position: Sitting, Cuff Size: Normal)   Pulse 92   Temp 98 F (36.7 C)   Resp 16   Ht 5\' 4"  (1.626 m)   Wt 150 lb 12.8 oz (68.4 kg)   SpO2 97%   BMI 25.88 kg/m   Physical Exam Vitals and nursing note reviewed.  Constitutional:      General: She is not in acute distress. Cardiovascular:     Rate and Rhythm: Normal rate and regular rhythm.  Pulmonary:     Effort: Pulmonary effort is normal.     Breath sounds: Normal breath sounds.  Abdominal:     Palpations: Abdomen is soft.     Tenderness: There is no abdominal tenderness.  Musculoskeletal:     Comments: Utilizing aid for stability  Neurological:     General: No focal deficit present.      Mental Status: She is alert and oriented to person, place, and time.         Assessment & Plan:   1. Hospital discharge follow-up Improved since discharge  2. Type 2 diabetes mellitus with other specified complication, with long-term current use of insulin (HCC) Continue   3. Essential hypertension Appears stable. continue    No follow-ups on file.   Tommie Raymond, MD

## 2023-06-18 ENCOUNTER — Ambulatory Visit (INDEPENDENT_AMBULATORY_CARE_PROVIDER_SITE_OTHER): Payer: BC Managed Care – PPO | Admitting: Physical Therapy

## 2023-06-18 ENCOUNTER — Encounter: Payer: Self-pay | Admitting: Physical Therapy

## 2023-06-18 DIAGNOSIS — R2689 Other abnormalities of gait and mobility: Secondary | ICD-10-CM

## 2023-06-18 DIAGNOSIS — M25662 Stiffness of left knee, not elsewhere classified: Secondary | ICD-10-CM

## 2023-06-18 DIAGNOSIS — M6281 Muscle weakness (generalized): Secondary | ICD-10-CM | POA: Diagnosis not present

## 2023-06-18 DIAGNOSIS — M25562 Pain in left knee: Secondary | ICD-10-CM | POA: Diagnosis not present

## 2023-06-18 NOTE — Therapy (Signed)
OUTPATIENT PHYSICAL THERAPY LOWER EXTREMITY TREATMENT   Patient Name: [name] MRN: [id number] DOB:29-Nov-1961, 62 y.o., female Today's Date: 06/18/2023  END OF SESSION:  PT End of Session - 06/18/23 0837     Visit Number 3    Number of Visits 6    Date for PT Re-Evaluation 07/21/23    Authorization Type [employer] State $72 copay    PT Start Time 0845    PT Stop Time 0930    PT Time Calculation (min) 45 min    Activity Tolerance Patient limited by pain    Behavior During Therapy [facility] for tasks assessed/performed               Past Medical History:  Diagnosis Date   Arthritis    Campylobacter enteritis May 2012   [facility] admission   Carpal tunnel syndrome, bilateral    Diabetes mellitus    Gastritis with bleeding due to alcohol 2010   [facility] admission   GERD (gastroesophageal reflux disease)    Hyperlipidemia    Hypertension    no meds   Menopause    Myocardial infarction (HCC)    Neck pain    Wears glasses    Past Surgical History:  Procedure Laterality Date   CARPAL TUNNEL RELEASE Left 05/14/2014   Procedure: LEFT ULNAR NEUROPLASTY AT ELBOW AND ENDOSCOPIC CARPAL TUNNEL RELEASE;  Surgeon: Jodi Marble, MD;  Location: [facility];  Service: Orthopedics;  Laterality: Left;   CORONARY STENT INTERVENTION N/A 10/09/2019   Procedure: CORONARY STENT INTERVENTION;  Surgeon: Yates Decamp, MD;  Location: [facility];  Service: Cardiovascular;  Laterality: N/A;   CYST EXCISION  1995   under arms   endometrial biopsy  2004   benign   FOOT OSTEOTOMY  2010   right-spurs   LEFT HEART CATH AND CORONARY ANGIOGRAPHY N/A 10/09/2019   Procedure: LEFT HEART CATH AND CORONARY ANGIOGRAPHY;  Surgeon: Yates Decamp, MD;  Location: [facility];  Service: Cardiovascular;  Laterality: N/A;   TONSILLECTOMY     TOTAL KNEE ARTHROPLASTY Left 02/26/2023   Procedure: LEFT TOTAL KNEE ARTHROPLASTY;  Surgeon: Kathryne Hitch, MD;  Location: [facility];  Service:  Orthopedics;  Laterality: Left;  Needs RNFA   ULNAR NERVE TRANSPOSITION Left 05/14/2014   Procedure: ULNAR NERVE DECOMPRESSION/TRANSPOSITION;  Surgeon: Jodi Marble, MD;  Location: [facility];  Service: Orthopedics;  Laterality: Left;   Patient Active Problem List   Diagnosis Date Noted   Mixed hyperlipidemia due to type 2 diabetes mellitus (HCC) 06/02/2023   Marijuana use, continuous 06/02/2023   Diabetic peripheral neuropathy associated with type 2 diabetes mellitus (HCC) 06/02/2023   Intractable vomiting with nausea 06/02/2023   Abdominal pain 06/01/2023   SIRS (systemic inflammatory response syndrome) (HCC) 06/01/2023   High anion gap metabolic acidosis 06/01/2023   Dehydration 06/01/2023   Acute prerenal azotemia 06/01/2023   Arthrofibrosis of total knee arthroplasty, subsequent encounter 04/07/2023   Status post left knee replacement 02/26/2023   Unilateral primary osteoarthritis, left knee 09/09/2022   Intractable nausea and vomiting 07/04/2022   Hematemesis 07/04/2022   Hypokalemia 07/04/2022   Lactic acidosis 07/04/2022   Pain due to onychomycosis of toenails of both feet 04/28/2022   Diabetic neuropathy, type II diabetes mellitus (HCC) 04/28/2022   De Quervain's tenosynovitis, left 11/13/2021   Bilateral injections given May 21, 2020 degenerative arthritis of knee, bilateral 05/21/2020   Traumatic hematoma of female breast 11/29/2019   Dizziness 11/29/2019  Hospital discharge follow-up 10/23/2019   NSTEMI (non-ST elevated myocardial infarction) (HCC) 10/10/2019   CAD (coronary artery disease) 10/10/2019   Chest pain 10/09/2019   Elevated troponin 10/09/2019   Uncontrolled type 2 diabetes mellitus with hyperglycemia (HCC) 10/09/2019   Obesity (BMI 30.0-34.9) 10/09/2019   Cervical spondylitis with radiculitis (HCC) 08/29/2019   Acute bursitis of left shoulder 01/30/2019   Chronic midline posterior neck pain 06/28/2018   Urgency incontinence 06/28/2018    Degenerative joint disease of knee, left 04/26/2018   Carpal tunnel syndrome on both sides 03/29/2018   Anemia 01/02/2018   Class 1 obesity with serious comorbidity and body mass index (BMI) of 32.0 to 32.9 in adult 07/23/2017   Left hip pain 05/04/2017   Carpal tunnel syndrome on right 12/27/2016   Polyarthralgia 11/09/2016   Uncontrolled type 2 diabetes mellitus with hyperglycemia, with long-term current use of insulin (HCC) 07/07/2016   Visit for preventive health examination 06/21/2016   Increased urinary frequency 06/21/2016   Chronic meniscal tear of knee 08/19/2015   Left knee pain 06/19/2015   Candidiasis of skin 06/18/2015   Genital herpes 07/31/2014   Ulnar nerve compression 09/21/2013   Noncompliance with diet and medication regimen 11/15/2012   Boil, axilla 08/14/2012   Arthritis    Screening for breast cancer 08/04/2011   Screening for colon cancer 08/04/2011   HIDRADENITIS SUPPURATIVA 04/11/2007   GERD without esophagitis 11/17/2006   ENDOMETRIAL POLYP 11/17/2006   LOW BACK PAIN 11/17/2006   FIBROIDS, UTERUS 11/16/2006   Hyperlipidemia 11/16/2006   Essential hypertension 11/16/2006    PCP: Georganna Skeans, MD  REFERRING PROVIDER: Kathryne Hitch*  REFERRING DIAG: G95.621 (ICD-10-CM) - Status post left knee replacement  THERAPY DIAG:  1. Acute pain of left knee  2. Stiffness of left knee, not elsewhere classified  3. Muscle weakness (generalized)  4. Other abnormalities of gait and mobility     Rationale for Evaluation and Treatment: Rehabilitation  ONSET DATE: 02/26/23 (DOS)  SUBJECTIVE:   SUBJECTIVE STATEMENT: She says "don't get me started talking about my knee pain"  PERTINENT HISTORY: OA, DM, HLD, HTN, MI s/p stent  PAIN:  Are you having pain?  Yes: NPRS scale: did not rate today but insinuates it is high.  Pain location: Lt knee, the entire knee Pain description: throbbing, sore Aggravating factors: walking Relieving factors:  resting it,  medications  PRECAUTIONS: Fall  WEIGHT BEARING RESTRICTIONS: No  FALLS:  Has patient fallen in last 6 months? Yes. 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    PALPATION:  LOWER EXTREMITY ROM:  ROM Left Eval In sitting  Knee flexion P: 96 A: 90  Knee extension P:10 A: 15   (Blank rows = not tested)  LOWER EXTREMITY MMT:    03/15/23: Not formally tested but grossly 2/5 Lt knee  MMT Right eval Left eval  Knee flexion  4  Knee extension  4   (Blank rows = not tested)  FUNCTIONAL TESTS:       GAIT:  Eval Distance walked: 100' Assistive device utilized: Environmental consultant - 2 wheeled Level of assistance: Mod I Comments: antalgic gait, decreased speed   TODAY'S TREATMENT:                                                                                                                              DATE:  DATE:06/18/2023 Pt seen for aquatic therapy today.  Treatment took place in water 3.5-4.75 ft in depth at the [facility] pool. Temp of water was 91.  Pt entered/exited the pool via steps with hand rail   Pt requires the buoyancy and hydrostatic pressure of water for support, and to offload joints by unweighting joint load by at least 50 % in navel deep water and by at least 75-80% in chest to neck deep water.  Viscosity of the water is needed for resistance of strengthening. Water current perturbations provides challenge to standing balance requiring  increased core activation.   Aquatic PT exercies Chest deep water for : Walking forward, walking backwards, lateral walking all holding onto floating barbell for support 3 round trips marches X 15 bilat hip abduction X 15 bilat Hip flexion/extension X 15 bilat Hamstring curls X 15 bilat Lunge step to wall with knee flexion stretch X 15 on Left Step ups onto first step of pool X 10  leading with left with bilat UE support Knee flexion stretch on 2nd step of pool 5 sec hold X 10    06/15/2023: Therapeutic Exercise: Nustep with BLEs & BUEs moving seat closer for range: first minute of ea seat setting slow stretch, then 2 minutes walking pace,  seat 7 for 3 min, seat 6 for 3 min, then seat 5 for 3 min.  PT demo & verbal cues on using this progression at [facility]. Pt verbalized understanding.  PT reviewed HEP Seated LAQ using strap to assist end range ext. PT demo & verbal cues on modification. Pt return demo 10 reps & verbalized understanding. Seated left knee flexion assisted with RLE for increased range. 10 sec hold. 5 reps. Seated LLE SLR 10 reps with PT demo & verbal cues on technique. Tandem stance with LLE in front & in back 30 sec ea 1 rep in clinic & cues for 3 reps at home. Standing alternate tapping 8" step 1 min. Standing with feet together eyes closed with intermittent touch counter for 30 sec 2 reps Supine heel slide with 18" ball & strap max flexion 5 sec hold & max ext / leg press 5 sec hold 10 reps. Pt verbalized better understanding of HEP.   Vaso to left knee medium compression 34* 10 min with elevation  06/09/2023   See HEP - performed trial reps with mod cues needed BERG balance assessment Nu step L5 X 8.5 minutes   PATIENT EDUCATION:  Education details: HEP Person educated: Patient Education method: Explanation, Demonstration, and Handouts Education comprehension: verbalized understanding, returned demonstration, and needs further education  HOME EXERCISE  PROGRAM: Access Code: [email or web address] URL: [email or web address] Date: 06/09/2023 Prepared by: Ivery Quale  Exercises - Supine Heel Slide with Strap  - 5-10 x daily - 7 x weekly - 1 sets - 5-10 reps - Seated Long Arc Quad (Mirrored)  - 3-5 x daily - 7 x weekly - 1-2 sets - 10 reps - 2 hold - Seated Knee Flexion Extension AAROM with Overpressure (Mirrored)  - 5-6 x daily - 7 x weekly - 1 sets - 5 reps - 10 hold - Seated Straight Leg Heel Taps  - 1-2 x daily - 7 x weekly - 3 sets - 5-10 reps - Tandem Stance  - 2 x daily - 6 x weekly - 1 sets - 3 reps - 30 sec hold - Step Taps on High Step  - 2 x daily - 6 x weekly - 1 sets - 10 reps - Feet Together Balance at [employer] Eyes Closed  - 2 x daily - 6 x weekly - 1 sets - 3 reps - 15 sec hold  Aquatic PT HEP Access Code: [email or web address] URL: [email or web address] Date: 06/18/2023 Prepared by: Ivery Quale  Exercises - Standing March at [facility]  - 1 x daily - 2 x weekly - 20 reps - Standing Hip Flexion Extension at [employer]  - 1 x daily - 2 x weekly - 20 reps - Standing Hip Abduction Adduction at Pool Wall  - 1 x daily - 2 x weekly - 20 reps - Forward Walking  - 1 x daily - 2 x weekly - 4 sets - Backward Walking  - 1 x daily - 2 x weekly - 4 sets - Side Stepping  - 2 x daily - 2 x weekly - 4 sets - 10 reps - Standing Knee Flexion  - 2 x daily - 2 x weekly - 1 sets - 20 reps - Squat  - 1 x daily - 2 x weekly - 15 reps - Lunge to [employer] at [employer]  - 1 x daily - 2 x weekly - 1 sets - 20 reps  ASSESSMENT:  CLINICAL IMPRESSION: She was instructed in aquatic PT exercises that she can perform at the [facility]. I did print out and laminate these for her so she can take them to the pool and remember which ones to use. She had good overall tolerance to session today.   OBJECTIVE IMPAIRMENTS: Abnormal gait, decreased balance, decreased endurance, decreased mobility, difficulty walking, decreased ROM, decreased strength,  hypomobility, increased edema, increased fascial restrictions, increased muscle spasms, and pain.   ACTIVITY LIMITATIONS: carrying, lifting, bending, sitting, standing, squatting, sleeping, stairs, transfers, bed mobility, toileting, dressing, and locomotion level  PARTICIPATION LIMITATIONS: meal prep, cleaning, laundry, driving, shopping, community activity, and occupation  PERSONAL FACTORS: 3+ comorbidities: OA, DM, HLD, HTN, MI s/p stent  are also affecting patient's functional outcome.   REHAB POTENTIAL: Fair elevated pain, limited ROM and tolerance to activity  CLINICAL DECISION MAKING: Evolving/moderate complexity  EVALUATION COMPLEXITY: Moderate   GOALS: Goals reviewed with patient? Yes  LONG TERM GOALS: Target date: 07/21/2023    Independent with final HEP including balance exercises Goal status: Ongoing 06/15/2023   2.  Report pain < 5/10 with standing and walking for improved function Goal status: Ongoing 06/15/2023  3.  Demonstrate ability to amb with LRAD modified independent for improved mobility Goal status: Ongoing 06/15/2023  4. Improve BERG balance test to 46 or better to show improved balance.  Goal status: Ongoing 06/15/2023    PLAN:  PT FREQUENCY:  1-2/wk  PT DURATION: 6 weeks  PLANNED INTERVENTIONS: Therapeutic exercises, Therapeutic activity, Neuromuscular re-education, Balance training, Gait training, Patient/Family education, Self Care, Joint mobilization, Stair training, DME instructions, Aquatic Therapy, Dry Needling, Electrical stimulation, Cryotherapy, Moist heat, Taping, Vasopneumatic device, Manual therapy, and Re-evaluation  PLAN FOR NEXT SESSION: aggressive ROM, focus as tolerated, quad strengthening, gait and balance.   April Manson, PT, DPT 06/18/2023, 8:38 AM

## 2023-06-22 ENCOUNTER — Encounter: Payer: Self-pay | Admitting: Podiatry

## 2023-06-22 ENCOUNTER — Encounter: Payer: Self-pay | Admitting: Physician Assistant

## 2023-06-22 ENCOUNTER — Ambulatory Visit (INDEPENDENT_AMBULATORY_CARE_PROVIDER_SITE_OTHER): Payer: BC Managed Care – PPO | Admitting: Podiatry

## 2023-06-22 ENCOUNTER — Ambulatory Visit (INDEPENDENT_AMBULATORY_CARE_PROVIDER_SITE_OTHER): Payer: BC Managed Care – PPO | Admitting: Physician Assistant

## 2023-06-22 VITALS — BP 126/70 | HR 82 | Ht 63.0 in | Wt 153.2 lb

## 2023-06-22 DIAGNOSIS — M79675 Pain in left toe(s): Secondary | ICD-10-CM

## 2023-06-22 DIAGNOSIS — R112 Nausea with vomiting, unspecified: Secondary | ICD-10-CM

## 2023-06-22 DIAGNOSIS — E1140 Type 2 diabetes mellitus with diabetic neuropathy, unspecified: Secondary | ICD-10-CM | POA: Diagnosis not present

## 2023-06-22 DIAGNOSIS — Z1211 Encounter for screening for malignant neoplasm of colon: Secondary | ICD-10-CM

## 2023-06-22 DIAGNOSIS — B351 Tinea unguium: Secondary | ICD-10-CM

## 2023-06-22 DIAGNOSIS — K219 Gastro-esophageal reflux disease without esophagitis: Secondary | ICD-10-CM | POA: Diagnosis not present

## 2023-06-22 DIAGNOSIS — M79674 Pain in right toe(s): Secondary | ICD-10-CM | POA: Diagnosis not present

## 2023-06-22 DIAGNOSIS — K5900 Constipation, unspecified: Secondary | ICD-10-CM

## 2023-06-22 MED ORDER — NA SULFATE-K SULFATE-MG SULF 17.5-3.13-1.6 GM/177ML PO SOLN: 1.0000 | Freq: Once | ORAL | 0 refills | Status: AC

## 2023-06-22 NOTE — Progress Notes (Signed)
Subjective:    Patient ID: [name], female    DOB: 1961-02-07, 62 y.o.   MRN: [id number]  HPI  Jesyca is a 62 year old African-American female, new to GI today referred after recent hospitalization 7/1 through 06/04/2023 with nausea and vomiting.  She was cared for by the hospitalist service at that time and not seen by GI. She had CT of the abdomen pelvis with contrast on admission which showed fatty liver, a 7 mm low-density in the left lobe of the liver consistent with cyst or hemangioma, small hiatal hernia, mild diffuse wall thickening of the colon but noted incomplete distention and a few diverticuli. Per the hospitalist note, patient was using cannabis on a daily basis and had also been on Ozempic.  Ozempic was felt to be a culprit in addition to and/or along with daily cannabis use.  She was advised to stop both.  She was started on IV Reglan with improvement in symptoms and then discharged home with Reglan to use on a as needed basis. This time she says that she feels significantly better since she stopped the Ozempic, she did not comment about any continued cannabis use.  She is a somewhat difficult historian says she has been having problems with urinary urgency and incontinence, more difficulty with constipation but no fecal incontinence.  She has been using MiraLAX on a daily basis which she finds helpful.  No current complaints of abdominal pain and no nausea or vomiting. She did have prior colonoscopy by her report done in 2011 at [facility] in [place] and I cannot see the results of that exam. No family history of colon cancer that she is aware of. She does have history of coronary artery disease is status post drug-eluting stent x 2 in 2020 with NSTEMI, history of hypertension adult onset diabetes mellitus with neuropathy, osteoarthritis,.  Labs on admission on 05/31/2023 WBC was 15,000/hemoglobin 15.8/hematocrit 50.7 Sodium 140/potassium 3.4/BUN 15/creatinine 0.64, LFTs  within normal limits, sed rate and CRP were normal/INR normal, drug screen positive for cannabis.  Review of Systems Pertinent positive and negative review of systems were noted in the above HPI section.  All other review of systems was otherwise negative.   Outpatient Encounter Medications as of 06/22/2023  Medication Sig   Ascorbic Acid (VITAMIN C PO) Take 1 tablet by mouth daily.   aspirin 81 MG chewable tablet Chew 1 tablet (81 mg total) by mouth 2 (two) times daily.   atorvastatin (LIPITOR) 40 MG tablet Take 1 tablet (40 mg total) by mouth daily.   Blood Glucose Monitoring Suppl (ACCU-CHEK AVIVA PLUS) w/Device KIT Use to check blood sugar 2 times a day   Continuous Glucose Sensor (FREESTYLE LIBRE 3 SENSOR) MISC APPLY 1 SENSOR EVERY 14 DAYS   cyanocobalamin (VITAMIN B12) 1000 MCG tablet Take 1 tablet (1,000 mcg total) by mouth daily.   FARXIGA 10 MG TABS tablet Take 1 tablet (10 mg total) by mouth daily.   gabapentin (NEURONTIN) 600 MG tablet Take 1 tablet (600 mg total) by mouth 3 (three) times daily. (Patient taking differently: Take 600 mg by mouth daily.)   glucose blood (BAYER CONTOUR TEST) test strip TEST BLOOD SUGARS 3 TIMES DAILY   glucose blood test strip Use to check blood sugar 2 times a day   HYDROcodone-acetaminophen (NORCO/VICODIN) 5-325 MG tablet Take 1 tablet by mouth every 6 (six) hours as needed for moderate pain. (Patient taking differently: Take 1 tablet by mouth as needed for moderate pain.)  HYDROmorphone (DILAUDID) 2 MG tablet Take 2 mg by mouth as needed for severe pain.   hydrOXYzine (ATARAX) 25 MG tablet Take 1 tablet (25 mg total) by mouth 3 (three) times daily as needed. itching (Patient taking differently: Take 25 mg by mouth as needed for anxiety. itching)   insulin aspart (FIASP FLEXTOUCH) 100 UNIT/ML FlexTouch Pen Inject 6-20 Units into the skin 3 (three) times daily before meals. (Patient taking differently: Inject 32 Units into the skin daily.)   insulin  degludec (TRESIBA FLEXTOUCH) 200 UNIT/ML FlexTouch Pen INJECT 30 TO 34 UNITS SUBCUTANEOUSLY ONCE DAILY Strength: 200 UNIT/ML (Patient taking differently: Inject 30-34 Units into the skin daily. Depending on BS)   Insulin Pen Needle 32G X 4 MM MISC Use 4x a day   Lancet Devices (EASY TOUCH LANCING DEVICE) MISC Use to check diabetes 4 times daily  E 11.65   latanoprost (XALATAN) 0.005 % ophthalmic solution Place 1 drop into both eyes at bedtime.   metFORMIN (GLUCOPHAGE) 1000 MG tablet Take 1 tablet (1,000 mg total) by mouth daily with supper.   metoCLOPramide (REGLAN) 10 MG tablet Take 1 tablet (10 mg total) by mouth every 8 (eight) hours as needed for nausea.   metoprolol succinate (TOPROL-XL) 50 MG 24 hr tablet TAKE 1 TABLET BY MOUTH ONCE DAILY. TAKE  WITH  OR  IMMEDIATELY  FOLLOWING  A  MEAL   metroNIDAZOLE (FLAGYL) 500 MG tablet Take 1 tablet (500 mg total) by mouth 3 (three) times daily.   Na Sulfate-K Sulfate-Mg Sulf 17.5-3.13-1.6 GM/177ML SOLN Take 1 kit by mouth once for 1 dose.   naloxone (NARCAN) nasal spray 4 mg/0.1 mL Place 1 spray into the nose as needed (or).   ondansetron (ZOFRAN-ODT) 8 MG disintegrating tablet Take 1 tablet (8 mg total) by mouth every 8 (eight) hours as needed for nausea or vomiting.   pantoprazole (PROTONIX) 40 MG tablet Take 1 tablet (40 mg total) by mouth 2 (two) times daily.   tiZANidine (ZANAFLEX) 4 MG tablet Take 1 tablet (4 mg total) by mouth every 6 (six) hours as needed for muscle spasms. (Patient taking differently: Take 4 mg by mouth daily.)   triamcinolone cream (KENALOG) 0.1 % Apply 1 Application topically daily.   No facility-administered encounter medications on file as of 06/22/2023.   Allergies  Allergen Reactions   Oxycodone-Acetaminophen Other (See Comments)    Causes her to feel "hot".   Penicillins Rash   Patient Active Problem List   Diagnosis Date Noted   Mixed hyperlipidemia due to type 2 diabetes mellitus (HCC) 06/02/2023   Marijuana  use, continuous 06/02/2023   Diabetic peripheral neuropathy associated with type 2 diabetes mellitus (HCC) 06/02/2023   Intractable vomiting with nausea 06/02/2023   Abdominal pain 06/01/2023   SIRS (systemic inflammatory response syndrome) (HCC) 06/01/2023   High anion gap metabolic acidosis 06/01/2023   Dehydration 06/01/2023   Acute prerenal azotemia 06/01/2023   Arthrofibrosis of total knee arthroplasty, subsequent encounter 04/07/2023   Status post left knee replacement 02/26/2023   Unilateral primary osteoarthritis, left knee 09/09/2022   Intractable nausea and vomiting 07/04/2022   Hematemesis 07/04/2022   Hypokalemia 07/04/2022   Lactic acidosis 07/04/2022   Pain due to onychomycosis of toenails of both feet 04/28/2022   Diabetic neuropathy, type II diabetes mellitus (HCC) 04/28/2022   De Quervain's tenosynovitis, left 11/13/2021   Bilateral injections given May 21, 2020 degenerative arthritis of knee, bilateral 05/21/2020   Traumatic hematoma of female breast 11/29/2019   Dizziness 11/29/2019  Hospital discharge follow-up 10/23/2019   NSTEMI (non-ST elevated myocardial infarction) (HCC) 10/10/2019   CAD (coronary artery disease) 10/10/2019   Chest pain 10/09/2019   Elevated troponin 10/09/2019   Uncontrolled type 2 diabetes mellitus with hyperglycemia (HCC) 10/09/2019   Obesity (BMI 30.0-34.9) 10/09/2019   Cervical spondylitis with radiculitis (HCC) 08/29/2019   Acute bursitis of left shoulder 01/30/2019   Chronic midline posterior neck pain 06/28/2018   Urgency incontinence 06/28/2018   Degenerative joint disease of knee, left 04/26/2018   Carpal tunnel syndrome on both sides 03/29/2018   Anemia 01/02/2018   Class 1 obesity with serious comorbidity and body mass index (BMI) of 32.0 to 32.9 in adult 07/23/2017   Left hip pain 05/04/2017   Carpal tunnel syndrome on right 12/27/2016   Polyarthralgia 11/09/2016   Uncontrolled type 2 diabetes mellitus with hyperglycemia,  with long-term current use of insulin (HCC) 07/07/2016   Visit for preventive health examination 06/21/2016   Increased urinary frequency 06/21/2016   Chronic meniscal tear of knee 08/19/2015   Left knee pain 06/19/2015   Candidiasis of skin 06/18/2015   Genital herpes 07/31/2014   Ulnar nerve compression 09/21/2013   Noncompliance with diet and medication regimen 11/15/2012   Boil, axilla 08/14/2012   Arthritis    Screening for breast cancer 08/04/2011   Screening for colon cancer 08/04/2011   HIDRADENITIS SUPPURATIVA 04/11/2007   GERD without esophagitis 11/17/2006   ENDOMETRIAL POLYP 11/17/2006   LOW BACK PAIN 11/17/2006   FIBROIDS, UTERUS 11/16/2006   Hyperlipidemia 11/16/2006   Essential hypertension 11/16/2006   Social History   Socioeconomic History   Marital status: Single    Spouse name: Not on file   Number of children: 2   Years of education: 12   Highest education level: High school graduate  Occupational History   Not on file  Tobacco Use   Smoking status: Never   Smokeless tobacco: Never  Vaping Use   Vaping status: Never Used  Substance and Sexual Activity   Alcohol use: Not Currently   Drug use: Yes    Types: Marijuana    Comment: sometimes   Sexual activity: Yes    Birth control/protection: None    Comment: same partner x 10 years  Other Topics Concern   Not on file  Social History Narrative   She works as a Arboriculturist.   Right-handed.   One cup caffeine per day.   She lives at home with daughter.   Social Determinants of Health   Financial Resource Strain: Not on file  Food Insecurity: No Food Insecurity (05/31/2023)   Hunger Vital Sign    Worried About Running Out of Food in the Last Year: Never true    Ran Out of Food in the Last Year: Never true  Transportation Needs: No Transportation Needs (05/31/2023)   PRAPARE - Administrator, Civil Service (Medical): No    Lack of Transportation (Non-Medical): No  Physical Activity: Not  on file  Stress: Not on file  Social Connections: Not on file  Intimate Partner Violence: Not At Risk (05/31/2023)   Humiliation, Afraid, Rape, and Kick questionnaire    Fear of Current or Ex-Partner: No    Emotionally Abused: No    Physically Abused: No    Sexually Abused: No    Ms. Staat's family history includes Diabetes in her father, maternal grandmother, and mother; Heart attack in her mother and sister; Hypertension in her maternal grandmother and mother; Stroke in her father.  Objective:    Vitals:   06/22/23 1031  BP: 126/70  Pulse: 82    Physical Exam Well-developed well-nourished female/female in no acute distress.  Height, Weight, 153  BMI 271.  HEENT; nontraumatic normocephalic, EOMI, PE R LA, sclera anicteric. Oropharynx;not done Neck; supple, no JVD Cardiovascular; regular rate and rhythm with S1-S2, no murmur rub or gallop Pulmonary; Clear bilaterally Abdomen; soft, nontender, nondistended, no palpable mass or hepatosplenomegaly, bowel sounds are active Rectal;not done today Skin; benign exam, no jaundice rash or appreciable lesions Extremities; no clubbing cyanosis or edema skin warm and dry Neuro/Psych; alert and oriented x4, grossly nonfocal mood and affect appropriate        Assessment & Plan:   #58 62 year old African-American female here for follow-up after recent hospitalization 7/1 through 06/04/2023 on the hospitalist service and not seen by GI.  She was admitted with intractable nausea and vomiting. CT as outlined above ordered as mild diffuse wall thickening of the colon but did note incomplete distention and her symptoms were not consistent with any sort of a colitis. It was felt that she had nausea and vomiting secondary to daily cannabis use and Ozempic. She was advised to discontinue both, says that she has and is currently feeling better without any nausea or vomiting.  She has used Reglan on occasion but is not on this scheduled.  No  current complaints of abdominal pain #2.  Constipation mild to improved with daily MiraLAX #3.  Colon cancer screening-last colonoscopy 2011 done in [place]-I cannot view that report. Overdue for colon screening #4.  Hypertension #5.  Coronary artery disease status post prior NSTEMI 2020/drug-eluting stents x 2 Echo 2020 November EF 50 to 55%  #6 adult onset diabetes mellitus with neuropathy #7  Chronic pain medication use/hydrocodone  Plan patient advised to stay off of Ozempic, remain abstinent from daily cannabis use Continue twice daily PPI which she had been on previously Protonix 40 mg She can use Zofran 8 mg every 6 hours as needed for nausea, would not refill metoclopramide at this time Continue MiraLAX 17 g in 8 ounces of water daily Will schedule for colonoscopy with Dr. Adela Lank.  Procedure was discussed in detail with patient including indications risk benefits and she is agreeable to proceed.  Ron Junco Oswald Hillock PA-C 06/22/2023   Cc: Georganna Skeans, MD

## 2023-06-22 NOTE — Progress Notes (Signed)
This patient returns to my office for at risk foot care.  This patient requires this care by a professional since this patient will be at risk due to having diabetic neuropathy.  This patient is unable to cut nails herself since the patient cannot reach her nails.These nails are painful walking and wearing shoes.  This patient presents for at risk foot care today.  General Appearance  Alert, conversant and in no acute stress.  Vascular  Dorsalis pedis and posterior tibial  pulses are palpable  bilaterally.  Capillary return is within normal limits  bilaterally. Temperature is within normal limits  bilaterally.  Neurologic  Senn-Weinstein monofilament wire test diminished  bilaterally. Muscle power within normal limits bilaterally.  Nails Thick disfigured discolored nails with subungual debris  from hallux to fifth toes bilaterally. No evidence of bacterial infection or drainage bilaterally.  Orthopedic  No limitations of motion  feet .  No crepitus or effusions noted.  No bony pathology or digital deformities noted.  Skin  normotropic skin with no porokeratosis noted bilaterally.  No signs of infections or ulcers noted.     Onychomycosis  Pain in right toes  Pain in left toes  Consent was obtained for treatment procedures.   Mechanical debridement of nails 1-5  bilaterally performed with a nail nipper.  Filed with dremel without incident.    Return office visit   9 weeks                  Told patient to return for periodic foot care and evaluation due to potential at risk complications.   Gregory Mayer DPM   

## 2023-06-22 NOTE — Patient Instructions (Signed)
Take 8 ounces of Miralax in 8 ounces of water daily.  You have been scheduled for a colonoscopy. Please follow written instructions given to you at your visit today.   Please pick up your prep supplies at the pharmacy within the next 1-3 days.  If you use inhalers (even only as needed), please bring them with you on the day of your procedure.  DO NOT TAKE 7 DAYS PRIOR TO TEST- Trulicity (dulaglutide) Ozempic, Wegovy (semaglutide) Mounjaro (tirzepatide) Bydureon Bcise (exanatide extended release)  DO NOT TAKE 1 DAY PRIOR TO YOUR TEST Rybelsus (semaglutide) Adlyxin (lixisenatide) Victoza (liraglutide) Byetta (exanatide) ___________________________________________________________________________  _______________________________________________________  If your blood pressure at your visit was 140/90 or greater, please contact your primary care physician to follow up on this.  _______________________________________________________  If you are age 51 or older, your body mass index should be between 23-30. Your Body mass index is 27.15 kg/m. If this is out of the aforementioned range listed, please consider follow up with your Primary Care Provider.  If you are age 42 or younger, your body mass index should be between 19-25. Your Body mass index is 27.15 kg/m. If this is out of the aformentioned range listed, please consider follow up with your Primary Care Provider.   ________________________________________________________  The [facility] providers would like to encourage you to use [facility] to communicate with providers for non-urgent requests or questions.  Due to long hold times on the telephone, sending your provider a message by [facility] may be a faster and more efficient way to get a response.  Please allow 48 business hours for a response.  Please remember that this is for non-urgent requests.  _______________________________________________________

## 2023-06-23 ENCOUNTER — Ambulatory Visit (INDEPENDENT_AMBULATORY_CARE_PROVIDER_SITE_OTHER): Payer: BC Managed Care – PPO | Admitting: Physical Therapy

## 2023-06-23 ENCOUNTER — Encounter: Payer: Self-pay | Admitting: Physical Therapy

## 2023-06-23 ENCOUNTER — Other Ambulatory Visit (INDEPENDENT_AMBULATORY_CARE_PROVIDER_SITE_OTHER): Payer: BC Managed Care – PPO

## 2023-06-23 ENCOUNTER — Ambulatory Visit: Payer: BC Managed Care – PPO | Admitting: Orthopaedic Surgery

## 2023-06-23 ENCOUNTER — Encounter: Payer: Self-pay | Admitting: Orthopaedic Surgery

## 2023-06-23 ENCOUNTER — Telehealth: Payer: Self-pay | Admitting: Orthopaedic Surgery

## 2023-06-23 DIAGNOSIS — Z96652 Presence of left artificial knee joint: Secondary | ICD-10-CM

## 2023-06-23 DIAGNOSIS — M6281 Muscle weakness (generalized): Secondary | ICD-10-CM | POA: Diagnosis not present

## 2023-06-23 DIAGNOSIS — R2681 Unsteadiness on feet: Secondary | ICD-10-CM

## 2023-06-23 DIAGNOSIS — R2689 Other abnormalities of gait and mobility: Secondary | ICD-10-CM | POA: Diagnosis not present

## 2023-06-23 DIAGNOSIS — M25662 Stiffness of left knee, not elsewhere classified: Secondary | ICD-10-CM | POA: Diagnosis not present

## 2023-06-23 DIAGNOSIS — M25562 Pain in left knee: Secondary | ICD-10-CM

## 2023-06-23 DIAGNOSIS — R600 Localized edema: Secondary | ICD-10-CM

## 2023-06-23 NOTE — Telephone Encounter (Signed)
703 form and $25 cash received. To [employer].

## 2023-06-23 NOTE — Progress Notes (Signed)
Agree with assessment and plan as outlined.  

## 2023-06-23 NOTE — Therapy (Signed)
OUTPATIENT PHYSICAL THERAPY LOWER EXTREMITY TREATMENT   Patient Name: [name] MRN: [id number] DOB:27-Feb-1961, 62 y.o., female Today's Date: 06/23/2023  END OF SESSION:  PT End of Session - 06/23/23 0808     Visit Number 4    Number of Visits 6    Date for PT Re-Evaluation 07/21/23    Authorization Type [employer] State $72 copay    PT Start Time 0800    PT Stop Time 0850    PT Time Calculation (min) 50 min    Activity Tolerance Patient limited by pain    Behavior During Therapy [facility] for tasks assessed/performed                Past Medical History:  Diagnosis Date   Arthritis    Campylobacter enteritis May 2012   [facility] admission   Carpal tunnel syndrome, bilateral    Diabetes mellitus    Gastritis with bleeding due to alcohol 2010   [facility] admission   GERD (gastroesophageal reflux disease)    Hyperlipidemia    Hypertension    no meds   Menopause    Myocardial infarction (HCC)    Neck pain    Wears glasses    Past Surgical History:  Procedure Laterality Date   CARPAL TUNNEL RELEASE Left 05/14/2014   Procedure: LEFT ULNAR NEUROPLASTY AT ELBOW AND ENDOSCOPIC CARPAL TUNNEL RELEASE;  Surgeon: Jodi Marble, MD;  Location: [facility];  Service: Orthopedics;  Laterality: Left;   CORONARY STENT INTERVENTION N/A 10/09/2019   Procedure: CORONARY STENT INTERVENTION;  Surgeon: Yates Decamp, MD;  Location: [facility];  Service: Cardiovascular;  Laterality: N/A;   CYST EXCISION  1995   under arms   endometrial biopsy  2004   benign   FOOT OSTEOTOMY  2010   right-spurs   LEFT HEART CATH AND CORONARY ANGIOGRAPHY N/A 10/09/2019   Procedure: LEFT HEART CATH AND CORONARY ANGIOGRAPHY;  Surgeon: Yates Decamp, MD;  Location: [facility];  Service: Cardiovascular;  Laterality: N/A;   TONSILLECTOMY     TOTAL KNEE ARTHROPLASTY Left 02/26/2023   Procedure: LEFT TOTAL KNEE ARTHROPLASTY;  Surgeon: Kathryne Hitch, MD;  Location: [facility];  Service:  Orthopedics;  Laterality: Left;  Needs RNFA   ULNAR NERVE TRANSPOSITION Left 05/14/2014   Procedure: ULNAR NERVE DECOMPRESSION/TRANSPOSITION;  Surgeon: Jodi Marble, MD;  Location:  SURGERY CENTER;  Service: Orthopedics;  Laterality: Left;   Patient Active Problem List   Diagnosis Date Noted   Mixed hyperlipidemia due to type 2 diabetes mellitus (HCC) 06/02/2023   Marijuana use, continuous 06/02/2023   Diabetic peripheral neuropathy associated with type 2 diabetes mellitus (HCC) 06/02/2023   Intractable vomiting with nausea 06/02/2023   Abdominal pain 06/01/2023   SIRS (systemic inflammatory response syndrome) (HCC) 06/01/2023   High anion gap metabolic acidosis 06/01/2023   Dehydration 06/01/2023   Acute prerenal azotemia 06/01/2023   Arthrofibrosis of total knee arthroplasty, subsequent encounter 04/07/2023   Status post left knee replacement 02/26/2023   Unilateral primary osteoarthritis, left knee 09/09/2022   Intractable nausea and vomiting 07/04/2022   Hematemesis 07/04/2022   Hypokalemia 07/04/2022   Lactic acidosis 07/04/2022   Pain due to onychomycosis of toenails of both feet 04/28/2022   Diabetic neuropathy, type II diabetes mellitus (HCC) 04/28/2022   De Quervain's tenosynovitis, left 11/13/2021   Bilateral injections given May 21, 2020 degenerative arthritis of knee, bilateral 05/21/2020   Traumatic hematoma of female breast 11/29/2019   Dizziness 11/29/2019  Hospital discharge follow-up 10/23/2019   NSTEMI (non-ST elevated myocardial infarction) (HCC) 10/10/2019   CAD (coronary artery disease) 10/10/2019   Chest pain 10/09/2019   Elevated troponin 10/09/2019   Uncontrolled type 2 diabetes mellitus with hyperglycemia (HCC) 10/09/2019   Obesity (BMI 30.0-34.9) 10/09/2019   Cervical spondylitis with radiculitis (HCC) 08/29/2019   Acute bursitis of left shoulder 01/30/2019   Chronic midline posterior neck pain 06/28/2018   Urgency incontinence 06/28/2018    Degenerative joint disease of knee, left 04/26/2018   Carpal tunnel syndrome on both sides 03/29/2018   Anemia 01/02/2018   Class 1 obesity with serious comorbidity and body mass index (BMI) of 32.0 to 32.9 in adult 07/23/2017   Left hip pain 05/04/2017   Carpal tunnel syndrome on right 12/27/2016   Polyarthralgia 11/09/2016   Uncontrolled type 2 diabetes mellitus with hyperglycemia, with long-term current use of insulin (HCC) 07/07/2016   Visit for preventive health examination 06/21/2016   Increased urinary frequency 06/21/2016   Chronic meniscal tear of knee 08/19/2015   Left knee pain 06/19/2015   Candidiasis of skin 06/18/2015   Genital herpes 07/31/2014   Ulnar nerve compression 09/21/2013   Noncompliance with diet and medication regimen 11/15/2012   Boil, axilla 08/14/2012   Arthritis    Screening for breast cancer 08/04/2011   Screening for colon cancer 08/04/2011   HIDRADENITIS SUPPURATIVA 04/11/2007   GERD without esophagitis 11/17/2006   ENDOMETRIAL POLYP 11/17/2006   LOW BACK PAIN 11/17/2006   FIBROIDS, UTERUS 11/16/2006   Hyperlipidemia 11/16/2006   Essential hypertension 11/16/2006    PCP: Georganna Skeans, MD  REFERRING PROVIDER: Kathryne Hitch*  REFERRING DIAG: W09.811 (ICD-10-CM) - Status post left knee replacement  THERAPY DIAG:  1. Acute pain of left knee  2. Stiffness of left knee, not elsewhere classified  3. Muscle weakness (generalized)  4. Other abnormalities of gait and mobility  5. Unsteadiness on feet  6. Localized edema      Rationale for Evaluation and Treatment: Rehabilitation  ONSET DATE: 02/26/23 (DOS)  SUBJECTIVE:   SUBJECTIVE STATEMENT: She says  her knee always hurts when she is doing any standing or walking, all the rain has also contributed to more pain. She does not feel she will be able to return to work.   PERTINENT HISTORY: OA, DM, HLD, HTN, MI s/p stent  PAIN:  Are you having pain?  Yes: NPRS scale: did  not rate today but insinuates it is high.  Pain location: Lt knee, the entire knee Pain description: throbbing, sore Aggravating factors: walking Relieving factors: resting it,  medications  PRECAUTIONS: Fall  WEIGHT BEARING RESTRICTIONS: No  FALLS:  Has patient fallen in last 6 months? Yes. 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    PALPATION:  LOWER EXTREMITY ROM:  ROM Left Eval In sitting Left 06/23/23  Knee flexion P: 96 A: 90 P:95 A:100  Knee extension P:10 A: 15 P:3 A:5   (Blank rows = not tested)  LOWER EXTREMITY MMT:  03/15/23: Not formally tested but grossly 2/5 Lt knee  MMT Right eval Left eval Left 06/23/23  Knee flexion  4 4+  Knee extension  4 4+   (Blank rows = not tested)  FUNCTIONAL TESTS:       GAIT: Eval Distance walked: 100' Assistive device utilized: Environmental consultant - 2 wheeled Level of assistance: Mod I Comments: antalgic gait, decreased speed   TODAY'S TREATMENT:                                                                                                                              06/23/2023: Therapeutic Exercise: Nustep with BLEs & BUEs moving seat closer for range: first minute of ea seat setting slow stretch, then 2 minutes walking pace,  seat 7 for 3 min, seat 6 for 3 min, then seat 5 for 3 min.  Reinforced verbal cues on using this progression at [facility] for ROM improvements. Pt verbalized understanding.  Seated LAQ using  strap to assist end range ext. PT demo & verbal cues on modification. Pt return demo 10 reps & verbalized understanding. Seated left knee flexion assisted with RLE for increased range. 10 sec hold. 5 reps. Tandem stance 1 min X 2 bilat with intermit UE support in bars Standing alternate tapping 8" step 1 min. Standing with feet together eyes closed with intermittent touch counter for 30 sec 2 reps Supine heel slide with 18" ball & strap max flexion 5 sec hold 10 reps. Pt verbalized better understanding of HEP.  Manual therapy for left knee PROM flexion and extension but more time spent on flexion now that her extension has improved.    Vaso to left knee medium compression 34* 10 min with elevation  DATE:06/18/2023 Pt seen for aquatic therapy today.  Treatment took place in water 3.5-4.75 ft in depth at the [facility] pool. Temp of water was 91.  Pt entered/exited the pool via steps with hand rail   Pt requires the buoyancy and hydrostatic pressure of water for support, and to offload joints by unweighting joint load by at least 50 % in navel deep water and by at least 75-80% in chest to neck deep water.  Viscosity of the water is needed for resistance of strengthening. Water current perturbations provides challenge to standing balance requiring increased core activation.   Aquatic PT exercies Chest deep water for : Walking forward, walking backwards, lateral walking all holding onto floating barbell for support 3 round trips marches X 15 bilat hip abduction X 15 bilat Hip flexion/extension X 15 bilat Hamstring curls X 15 bilat Lunge step to wall with knee flexion stretch X 15 on Left Step ups onto first step of pool X 10  leading with left with bilat UE support Knee flexion stretch on 2nd step of pool 5 sec hold X 10     PATIENT EDUCATION:  Education details: HEP Person educated: Patient Education method: Explanation, Demonstration, and Handouts Education comprehension:  verbalized  understanding, returned demonstration, and needs further education  HOME EXERCISE PROGRAM: Access Code: [email or web address] URL: [email or web address] Date: 06/09/2023 Prepared by: Ivery Quale  Exercises - Supine Heel Slide with Strap  - 5-10 x daily - 7 x weekly - 1 sets - 5-10 reps - Seated Long Arc Quad (Mirrored)  - 3-5 x daily - 7 x weekly - 1-2 sets - 10 reps - 2 hold - Seated Knee Flexion Extension AAROM with Overpressure (Mirrored)  - 5-6 x daily - 7 x weekly - 1 sets - 5 reps - 10 hold - Seated Straight Leg Heel Taps  - 1-2 x daily - 7 x weekly - 3 sets - 5-10 reps - Tandem Stance  - 2 x daily - 6 x weekly - 1 sets - 3 reps - 30 sec hold - Step Taps on High Step  - 2 x daily - 6 x weekly - 1 sets - 10 reps - Feet Together Balance at [employer] Eyes Closed  - 2 x daily - 6 x weekly - 1 sets - 3 reps - 15 sec hold  Aquatic PT HEP Access Code: [email or web address] URL: [email or web address] Date: 06/18/2023 Prepared by: Ivery Quale  Exercises - Standing March at [facility]  - 1 x daily - 2 x weekly - 20 reps - Standing Hip Flexion Extension at [employer]  - 1 x daily - 2 x weekly - 20 reps - Standing Hip Abduction Adduction at Pool Wall  - 1 x daily - 2 x weekly - 20 reps - Forward Walking  - 1 x daily - 2 x weekly - 4 sets - Backward Walking  - 1 x daily - 2 x weekly - 4 sets - Side Stepping  - 2 x daily - 2 x weekly - 4 sets - 10 reps - Standing Knee Flexion  - 2 x daily - 2 x weekly - 1 sets - 20 reps - Squat  - 1 x daily - 2 x weekly - 15 reps - Lunge to [employer] at [employer]  - 1 x daily - 2 x weekly - 1 sets - 20 reps  ASSESSMENT:  CLINICAL IMPRESSION: She has had extensive PT and still with some ROM and strength deficits and pain complaints with any standing activity. She did show improvements in objective measurements, see above but does not report much pain relief overall. She has 2 more visits and we will plan to discharge to independent program  after that as she is a member of the [employer]. We also did have her do one aquatic PT session and gave her water exercises she can do independently as well.   OBJECTIVE IMPAIRMENTS: Abnormal gait, decreased balance, decreased endurance, decreased mobility, difficulty walking, decreased ROM, decreased strength, hypomobility, increased edema, increased fascial restrictions, increased muscle spasms, and pain.   ACTIVITY LIMITATIONS: carrying, lifting, bending, sitting, standing, squatting, sleeping, stairs, transfers, bed mobility, toileting, dressing, and locomotion level  PARTICIPATION LIMITATIONS: meal prep, cleaning, laundry, driving, shopping, community activity, and occupation  PERSONAL FACTORS: 3+ comorbidities: OA, DM, HLD, HTN, MI s/p stent  are also affecting patient's functional outcome.   REHAB POTENTIAL: Fair elevated pain, limited ROM and tolerance to activity  CLINICAL DECISION MAKING: Evolving/moderate complexity  EVALUATION COMPLEXITY: Moderate   GOALS: Goals reviewed with patient? Yes  LONG TERM GOALS: Target date: 07/21/2023    Independent with final HEP including balance exercises Goal status: Ongoing 06/23/2023, she understands them but has not been doing them as often as recommended.  2.  Report pain < 5/10 with standing and walking for improved function Goal status: Ongoing 06/23/2023, still with high amounts of pain  3.  Demonstrate ability to amb with LRAD modified independent for improved mobility Goal status: Ongoing 06/23/2023, still with difficulty with this due to pain  4. Improve BERG balance test to 46 or better to show improved balance.  Goal status: Ongoing 06/23/2023, will retest at her last visit.    PLAN:  PT FREQUENCY:  1-2/wk  PT DURATION: 6 weeks  PLANNED INTERVENTIONS: Therapeutic exercises, Therapeutic activity, Neuromuscular re-education, Balance training, Gait training, Patient/Family education, Self Care, Joint mobilization, Stair  training, DME instructions, Aquatic Therapy, Dry Needling, Electrical stimulation, Cryotherapy, Moist heat, Taping, Vasopneumatic device, Manual therapy, and Re-evaluation  PLAN FOR NEXT SESSION: what did MD say? aggressive ROM, focus as tolerated, quad strengthening, gait and balance.   April Manson, PT, DPT 06/23/2023, 8:44 AM

## 2023-06-23 NOTE — Progress Notes (Signed)
The patient is now 4 months status post a left total knee arthroplasty.  She is having to still ambulate with a cane and even a walker from time to time.  She is 62 years old.  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  She was hospitalized earlier this month when with dehydration and nausea and vomiting.  It ended up that her hemoglobin A1c had gone from 6.3 up to 8.6.  I had to counsel her extensively about blood glucose control.  She is significantly deconditioned.  I do not see her being able to get back to any type of significant work other than sedentary work for the next 6 months to a year.  Examination of her left knee shows no evidence of infection.  It is ligamentously stable.  There is still swelling to be expected in a thin individual who is just 4 months out from surgery.  She has good range of motion of the knee.  2 views the left knee show well-seated press-fit total knee arthroplasty with no complicating features.  From my standpoint she can still not returned to work.  She is working on trying to get her strength back and her blood glucose under better control.  I would like to see her back in 3 months with a repeat standing AP and lateral of her left knee since these are press-fit implants.  I counseled her again significantly about better blood glucose control.

## 2023-06-28 ENCOUNTER — Ambulatory Visit (INDEPENDENT_AMBULATORY_CARE_PROVIDER_SITE_OTHER): Payer: BC Managed Care – PPO | Admitting: Family Medicine

## 2023-06-28 DIAGNOSIS — Z7984 Long term (current) use of oral hypoglycemic drugs: Secondary | ICD-10-CM

## 2023-06-28 DIAGNOSIS — W19XXXS Unspecified fall, sequela: Secondary | ICD-10-CM

## 2023-06-29 ENCOUNTER — Ambulatory Visit (INDEPENDENT_AMBULATORY_CARE_PROVIDER_SITE_OTHER): Payer: BC Managed Care – PPO | Admitting: Physical Therapy

## 2023-06-29 ENCOUNTER — Encounter: Payer: Self-pay | Admitting: Physical Therapy

## 2023-06-29 DIAGNOSIS — R600 Localized edema: Secondary | ICD-10-CM

## 2023-06-29 DIAGNOSIS — M25562 Pain in left knee: Secondary | ICD-10-CM

## 2023-06-29 DIAGNOSIS — M6281 Muscle weakness (generalized): Secondary | ICD-10-CM

## 2023-06-29 DIAGNOSIS — R2681 Unsteadiness on feet: Secondary | ICD-10-CM

## 2023-06-29 DIAGNOSIS — M25662 Stiffness of left knee, not elsewhere classified: Secondary | ICD-10-CM | POA: Diagnosis not present

## 2023-06-29 DIAGNOSIS — R2689 Other abnormalities of gait and mobility: Secondary | ICD-10-CM

## 2023-06-29 NOTE — Therapy (Signed)
OUTPATIENT PHYSICAL THERAPY LOWER EXTREMITY TREATMENT   Patient Name: [name] MRN: [id number] DOB:[birth year], 62 y.o., female Today's Date: 06/29/2023  END OF SESSION:  PT End of Session - 06/29/23 0801     Visit Number 5    Number of Visits 6    Date for PT Re-Evaluation 07/21/23    Authorization Type [employer] State $72 copay    PT Start Time 0800    PT Stop Time 0855    PT Time Calculation (min) 55 min    Activity Tolerance Patient limited by pain    Behavior During Therapy [facility] for tasks assessed/performed                Past Medical History:  Diagnosis Date   Arthritis    Campylobacter enteritis May 2012   [facility] admission   Carpal tunnel syndrome, bilateral    Diabetes mellitus    Gastritis with bleeding due to alcohol 2010   [facility] admission   GERD (gastroesophageal reflux disease)    Hyperlipidemia    Hypertension    no meds   Menopause    Myocardial infarction (HCC)    Neck pain    Wears glasses    Past Surgical History:  Procedure Laterality Date   CARPAL TUNNEL RELEASE Left 05/14/2014   Procedure: LEFT ULNAR NEUROPLASTY AT ELBOW AND ENDOSCOPIC CARPAL TUNNEL RELEASE;  Surgeon: Jodi Marble, MD;  Location: [facility];  Service: Orthopedics;  Laterality: Left;   CORONARY STENT INTERVENTION N/A 10/09/2019   Procedure: CORONARY STENT INTERVENTION;  Surgeon: Yates Decamp, MD;  [facility]: [facility];  Service: Cardiovascular;  Laterality: N/A;   CYST EXCISION  1995   under arms   endometrial biopsy  2004   benign   FOOT OSTEOTOMY  2010   right-spurs   LEFT HEART CATH AND CORONARY ANGIOGRAPHY N/A 10/09/2019   Procedure: LEFT HEART CATH AND CORONARY ANGIOGRAPHY;  Surgeon: Yates Decamp, MD;  [facility]: [facility];  Service: Cardiovascular;  Laterality: N/A;   TONSILLECTOMY     TOTAL KNEE ARTHROPLASTY Left 02/26/2023   Procedure: LEFT TOTAL KNEE ARTHROPLASTY;  Surgeon: Kathryne Hitch, MD;  Location: [facility];  Service:  Orthopedics;  Laterality: Left;  Needs RNFA   ULNAR NERVE TRANSPOSITION Left 05/14/2014   Procedure: ULNAR NERVE DECOMPRESSION/TRANSPOSITION;  Surgeon: Jodi Marble, MD;  Location: [facility];  Service: Orthopedics;  Laterality: Left;   Patient Active Problem List   Diagnosis Date Noted   Mixed hyperlipidemia due to type 2 diabetes mellitus (HCC) 06/02/2023   Marijuana use, continuous 06/02/2023   Diabetic peripheral neuropathy associated with type 2 diabetes mellitus (HCC) 06/02/2023   Intractable vomiting with nausea 06/02/2023   Abdominal pain 06/01/2023   SIRS (systemic inflammatory response syndrome) (HCC) 06/01/2023   High anion gap metabolic acidosis 06/01/2023   Dehydration 06/01/2023   Acute prerenal azotemia 06/01/2023   Arthrofibrosis of total knee arthroplasty, subsequent encounter 04/07/2023   Status post left knee replacement 02/26/2023   Unilateral primary osteoarthritis, left knee 09/09/2022   Intractable nausea and vomiting 07/04/2022   Hematemesis 07/04/2022   Hypokalemia 07/04/2022   Lactic acidosis 07/04/2022   Pain due to onychomycosis of toenails of both feet 04/28/2022   Diabetic neuropathy, type II diabetes mellitus (HCC) 04/28/2022   De Quervain's tenosynovitis, left 11/13/2021   Bilateral injections given May 21, 2020 degenerative arthritis of knee, bilateral 05/21/2020   Traumatic hematoma of female breast 11/29/2019   Dizziness 11/29/2019  Hospital discharge follow-up 10/23/2019   NSTEMI (non-ST elevated myocardial infarction) (HCC) 10/10/2019   CAD (coronary artery disease) 10/10/2019   Chest pain 10/09/2019   Elevated troponin 10/09/2019   Uncontrolled type 2 diabetes mellitus with hyperglycemia (HCC) 10/09/2019   Obesity (BMI 30.0-34.9) 10/09/2019   Cervical spondylitis with radiculitis (HCC) 08/29/2019   Acute bursitis of left shoulder 01/30/2019   Chronic midline posterior neck pain 06/28/2018   Urgency incontinence 06/28/2018    Degenerative joint disease of knee, left 04/26/2018   Carpal tunnel syndrome on both sides 03/29/2018   Anemia 01/02/2018   Class 1 obesity with serious comorbidity and body mass index (BMI) of 32.0 to 32.9 in adult 07/23/2017   Left hip pain 05/04/2017   Carpal tunnel syndrome on right 12/27/2016   Polyarthralgia 11/09/2016   Uncontrolled type 2 diabetes mellitus with hyperglycemia, with long-term current use of insulin (HCC) 07/07/2016   Visit for preventive health examination 06/21/2016   Increased urinary frequency 06/21/2016   Chronic meniscal tear of knee 08/19/2015   Left knee pain 06/19/2015   Candidiasis of skin 06/18/2015   Genital herpes 07/31/2014   Ulnar nerve compression 09/21/2013   Noncompliance with diet and medication regimen 11/15/2012   Boil, axilla 08/14/2012   Arthritis    Screening for breast cancer 08/04/2011   Screening for colon cancer 08/04/2011   HIDRADENITIS SUPPURATIVA 04/11/2007   GERD without esophagitis 11/17/2006   ENDOMETRIAL POLYP 11/17/2006   LOW BACK PAIN 11/17/2006   FIBROIDS, UTERUS 11/16/2006   Hyperlipidemia 11/16/2006   Essential hypertension 11/16/2006    PCP: Georganna Skeans, MD  REFERRING PROVIDER: Kirtland Bouchard, PA-C  REFERRING DIAG: [phone] (ICD-10-CM) - Status post left knee replacement  THERAPY DIAG:  1. Acute pain of left knee  2. Stiffness of left knee, not elsewhere classified  3. Muscle weakness (generalized)  4. Other abnormalities of gait and mobility  5. Unsteadiness on feet  6. Localized edema      Rationale for Evaluation and Treatment: Rehabilitation  ONSET DATE: 02/26/23 (DOS)  SUBJECTIVE:   SUBJECTIVE STATEMENT: She says knee pain is about 10/10 due to weather.  PERTINENT HISTORY: OA, DM, HLD, HTN, MI s/p stent  PAIN:  Are you having pain?  Yes: NPRS scale: did not rate today but insinuates it is high.  Pain location: Lt knee, the entire knee Pain description: throbbing,  sore Aggravating factors: walking Relieving factors: resting it,  medications  PRECAUTIONS: Fall  WEIGHT BEARING RESTRICTIONS: No  FALLS:  Has patient fallen in last 6 months? Yes. 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    PALPATION:  LOWER EXTREMITY ROM:  ROM Left Eval In sitting Left 06/23/23  Knee flexion P: 96 A: 90 P:95 A:100  Knee extension P:10 A: 15 P:3 A:5   (Blank rows = not tested)  LOWER EXTREMITY MMT:    03/15/23: Not formally tested but grossly 2/5 Lt knee  MMT Right eval Left eval Left 06/23/23  Knee flexion  4 4+  Knee extension  4 4+   (Blank rows = not tested)  FUNCTIONAL TESTS:       GAIT: Eval Distance walked: 100' Assistive device utilized: Environmental consultant - 2 wheeled Level of assistance: Mod I Comments: antalgic gait, decreased speed   TODAY'S TREATMENT:                                                                                                                              06/29/2023: Therapeutic Exercise: Nustep L6 with BLEs & BUEs moving seat closer for range:  seat 7 for 3 min, seat 6 for 3 min, then seat 5 for 5 min.   Leg press DL 81# 1B14, then left leg only 25# 2X10 Seated knee extension machine DL 5# 7W29 Seated hamstring curl machine 20# DL 5A21 Tandem stance 1 min X 2 bilat with intermit UE support  Standing with feet together eyes closed with intermittent UE support 30 sec 2 reps  Seated left knee flexion assisted  with RLE for increased range. 5 sec hold. 10 reps.  Vaso to left knee medium compression 34* 10 min with elevation  06/23/2023: Therapeutic Exercise: Nustep L6 with BLEs & BUEs moving seat closer for range:  seat 7 for 3 min, seat 6 for 3 min, then seat 5 for 3 min.  Reinforced verbal cues on using this progression at [place] for ROM improvements. Pt verbalized understanding.  Seated LAQ using strap to assist end range ext. PT demo & verbal cues on modification. Pt return demo 10 reps & verbalized understanding. Seated left knee flexion assisted with RLE for increased range. 10 sec hold. 5 reps. Tandem stance 1 min X 2 bilat with intermit UE support in bars Standing alternate tapping 8" step 1 min. Standing with feet together eyes closed with intermittent touch counter for 30 sec 2 reps Supine heel slide with 18" ball & strap max flexion 5 sec hold 10 reps. Pt verbalized better understanding of HEP.  Manual therapy for left knee PROM flexion and extension but more time spent on flexion now that her extension has improved.    Vaso to left knee medium compression 34* 10 min with elevation  DATE:06/18/2023 Pt seen for aquatic therapy today.  Treatment took place in water 3.5-4.75 ft in depth at the [facility] pool. Temp of water was 91.  Pt entered/exited the pool via steps with hand rail   Pt requires the buoyancy and hydrostatic pressure of water for support, and to offload joints by unweighting joint load by at least 50 % in navel deep water and by at least 75-80% in chest to neck deep water.  Viscosity of the water is needed for resistance of strengthening. Water current perturbations provides challenge to standing balance requiring increased core activation.   Aquatic PT exercies Chest deep water for : Walking forward, walking backwards, lateral walking all holding onto floating barbell for support 3 round trips marches X 15 bilat hip abduction X 15 bilat Hip  flexion/extension X  15 bilat Hamstring curls X 15 bilat Lunge step to wall with knee flexion stretch X 15 on Left Step ups onto first step of pool X 10  leading with left with bilat UE support Knee flexion stretch on 2nd step of pool 5 sec hold X 10     PATIENT EDUCATION:  Education details: HEP Person educated: Patient Education method: Explanation, Demonstration, and Handouts Education comprehension: verbalized understanding, returned demonstration, and needs further education  HOME EXERCISE PROGRAM: Access Code: [email or web address] URL: [email or web address] Date: 06/09/2023 Prepared by: Ivery Quale  Exercises - Supine Heel Slide with Strap  - 5-10 x daily - 7 x weekly - 1 sets - 5-10 reps - Seated Long Arc Quad (Mirrored)  - 3-5 x daily - 7 x weekly - 1-2 sets - 10 reps - 2 hold - Seated Knee Flexion Extension AAROM with Overpressure (Mirrored)  - 5-6 x daily - 7 x weekly - 1 sets - 5 reps - 10 hold - Seated Straight Leg Heel Taps  - 1-2 x daily - 7 x weekly - 3 sets - 5-10 reps - Tandem Stance  - 2 x daily - 6 x weekly - 1 sets - 3 reps - 30 sec hold - Step Taps on High Step  - 2 x daily - 6 x weekly - 1 sets - 10 reps - Feet Together Balance at [employer] Eyes Closed  - 2 x daily - 6 x weekly - 1 sets - 3 reps - 15 sec hold  Aquatic PT HEP Access Code: [email or web address] URL: [email or web address] Date: 06/18/2023 Prepared by: Ivery Quale  Exercises - Standing March at [facility]  - 1 x daily - 2 x weekly - 20 reps - Standing Hip Flexion Extension at [employer]  - 1 x daily - 2 x weekly - 20 reps - Standing Hip Abduction Adduction at Pool Wall  - 1 x daily - 2 x weekly - 20 reps - Forward Walking  - 1 x daily - 2 x weekly - 4 sets - Backward Walking  - 1 x daily - 2 x weekly - 4 sets - Side Stepping  - 2 x daily - 2 x weekly - 4 sets - 10 reps - Standing Knee Flexion  - 2 x daily - 2 x weekly - 1 sets - 20 reps - Squat  - 1 x daily - 2 x weekly - 15 reps - Lunge to Target at  [employer]  - 1 x daily - 2 x weekly - 1 sets - 20 reps  ASSESSMENT:  CLINICAL IMPRESSION: She now has rollator that she uses for community ambulation, she does ambulate without AD in clinic but this is slow and antalgic. She still is unable to return to work due to her pain and she will see MD again in 3 months. At this point she has had extensive PT 33 visits and will need continued work independently to further improve her function. She has 1 more visit and we will plan to discharge to independent program for continued strength and ROM after that as she is a member of the [employer].  OBJECTIVE IMPAIRMENTS: Abnormal gait, decreased balance, decreased endurance, decreased mobility, difficulty walking, decreased ROM, decreased strength, hypomobility, increased edema, increased fascial restrictions, increased muscle spasms, and pain.   ACTIVITY LIMITATIONS: carrying, lifting, bending, sitting, standing, squatting, sleeping, stairs, transfers, bed mobility, toileting, dressing, and locomotion level  PARTICIPATION LIMITATIONS: meal prep, cleaning, laundry, driving, shopping, community activity, and  occupation  PERSONAL FACTORS: 3+ comorbidities: OA, DM, HLD, HTN, MI s/p stent  are also affecting patient's functional outcome.   REHAB POTENTIAL: Fair elevated pain, limited ROM and tolerance to activity  CLINICAL DECISION MAKING: Evolving/moderate complexity  EVALUATION COMPLEXITY: Moderate   GOALS: Goals reviewed with patient? Yes  LONG TERM GOALS: Target date: 07/21/2023    Independent with final HEP including balance exercises Goal status: Ongoing 06/23/2023, she understands them but has not been doing them as often as recommended.    2.  Report pain < 5/10 with standing and walking for improved function Goal status: Ongoing 06/23/2023, still with high amounts of pain  3.  Demonstrate ability to amb with LRAD modified independent for improved mobility Goal status: Ongoing 06/23/2023, still  with difficulty with this due to pain  4. Improve BERG balance test to 46 or better to show improved balance.  Goal status: Ongoing 06/23/2023, will retest at her last visit.    PLAN:  PT FREQUENCY:  1-2/wk  PT DURATION: 6 weeks  PLANNED INTERVENTIONS: Therapeutic exercises, Therapeutic activity, Neuromuscular re-education, Balance training, Gait training, Patient/Family education, Self Care, Joint mobilization, Stair training, DME instructions, Aquatic Therapy, Dry Needling, Electrical stimulation, Cryotherapy, Moist heat, Taping, Vasopneumatic device, Manual therapy, and Re-evaluation  PLAN FOR NEXT SESSION: Final HEP review including gym equipment she can use at [facility] and discharge.   April Manson, PT, DPT 06/29/2023, 8:45 AM

## 2023-06-30 ENCOUNTER — Encounter: Payer: Self-pay | Admitting: Family Medicine

## 2023-06-30 NOTE — Progress Notes (Signed)
Established Patient Office Visit  Subjective    Patient ID: [name], female    DOB: May 25, 1961  Age: 62 y.o. MRN: [id number]  CC: No chief complaint on file.   HPI [name] presents for follow up of falls. Patient reports that she had evaluation that she did not pass. She denies improvement.    Outpatient Encounter Medications as of 06/28/2023  Medication Sig   Ascorbic Acid (VITAMIN C PO) Take 1 tablet by mouth daily.   aspirin 81 MG chewable tablet Chew 1 tablet (81 mg total) by mouth 2 (two) times daily.   atorvastatin (LIPITOR) 40 MG tablet Take 1 tablet (40 mg total) by mouth daily.   Blood Glucose Monitoring Suppl (ACCU-CHEK AVIVA PLUS) w/Device KIT Use to check blood sugar 2 times a day   Continuous Glucose Sensor (FREESTYLE LIBRE 3 SENSOR) MISC APPLY 1 SENSOR EVERY 14 DAYS   cyanocobalamin (VITAMIN B12) 1000 MCG tablet Take 1 tablet (1,000 mcg total) by mouth daily.   FARXIGA 10 MG TABS tablet Take 1 tablet (10 mg total) by mouth daily.   gabapentin (NEURONTIN) 600 MG tablet Take 1 tablet (600 mg total) by mouth 3 (three) times daily. (Patient taking differently: Take 600 mg by mouth daily.)   glucose blood (BAYER CONTOUR TEST) test strip TEST BLOOD SUGARS 3 TIMES DAILY   glucose blood test strip Use to check blood sugar 2 times a day   HYDROcodone-acetaminophen (NORCO/VICODIN) 5-325 MG tablet Take 1 tablet by mouth every 6 (six) hours as needed for moderate pain. (Patient taking differently: Take 1 tablet by mouth as needed for moderate pain.)   HYDROmorphone (DILAUDID) 2 MG tablet Take 2 mg by mouth as needed for severe pain.   hydrOXYzine (ATARAX) 25 MG tablet Take 1 tablet (25 mg total) by mouth 3 (three) times daily as needed. itching (Patient taking differently: Take 25 mg by mouth as needed for anxiety. itching)   insulin aspart (FIASP FLEXTOUCH) 100 UNIT/ML FlexTouch Pen Inject 6-20 Units into the skin 3 (three) times daily before meals. (Patient taking  differently: Inject 32 Units into the skin daily.)   insulin degludec (TRESIBA FLEXTOUCH) 200 UNIT/ML FlexTouch Pen INJECT 30 TO 34 UNITS SUBCUTANEOUSLY ONCE DAILY Strength: 200 UNIT/ML (Patient taking differently: Inject 30-34 Units into the skin daily. Depending on BS)   Insulin Pen Needle 32G X 4 MM MISC Use 4x a day   Lancet Devices (EASY TOUCH LANCING DEVICE) MISC Use to check diabetes 4 times daily  E 11.65   latanoprost (XALATAN) 0.005 % ophthalmic solution Place 1 drop into both eyes at bedtime.   metFORMIN (GLUCOPHAGE) 1000 MG tablet Take 1 tablet (1,000 mg total) by mouth daily with supper.   metoCLOPramide (REGLAN) 10 MG tablet Take 1 tablet (10 mg total) by mouth every 8 (eight) hours as needed for nausea.   metoprolol succinate (TOPROL-XL) 50 MG 24 hr tablet TAKE 1 TABLET BY MOUTH ONCE DAILY. TAKE  WITH  OR  IMMEDIATELY  FOLLOWING  A  MEAL   metroNIDAZOLE (FLAGYL) 500 MG tablet Take 1 tablet (500 mg total) by mouth 3 (three) times daily.   Na Sulfate-K Sulfate-Mg Sulf 17.5-3.13-1.6 GM/177ML SOLN Take by mouth.   naloxone (NARCAN) nasal spray 4 mg/0.1 mL Place 1 spray into the nose as needed (or).   ondansetron (ZOFRAN-ODT) 8 MG disintegrating tablet Take 1 tablet (8 mg total) by mouth every 8 (eight) hours as needed for nausea or vomiting.   pantoprazole (PROTONIX) 40  MG tablet Take 1 tablet (40 mg total) by mouth 2 (two) times daily.   tiZANidine (ZANAFLEX) 4 MG tablet Take 1 tablet (4 mg total) by mouth every 6 (six) hours as needed for muscle spasms. (Patient taking differently: Take 4 mg by mouth daily.)   triamcinolone cream (KENALOG) 0.1 % Apply 1 Application topically daily.   No facility-administered encounter medications on file as of 06/28/2023.    Past Medical History:  Diagnosis Date   Arthritis    Campylobacter enteritis May 2012   [facility]   Carpal tunnel syndrome, bilateral    Diabetes mellitus    Gastritis with bleeding due to alcohol 2010   [facility] admission    GERD (gastroesophageal reflux disease)    Hyperlipidemia    Hypertension    no meds   Menopause    Myocardial infarction [facility])    Neck pain    Wears glasses     Past Surgical History:  Procedure Laterality Date   CARPAL TUNNEL RELEASE Left 05/14/2014   Procedure: LEFT ULNAR NEUROPLASTY AT ELBOW AND ENDOSCOPIC CARPAL TUNNEL RELEASE;  Surgeon: Jodi Marble, MD;  Location: [facility];  Service: Orthopedics;  Laterality: Left;   CORONARY STENT INTERVENTION N/A 10/09/2019   Procedure: CORONARY STENT INTERVENTION;  Surgeon: Yates Decamp, MD;  [facility]: [facility];  Service: Cardiovascular;  Laterality: N/A;   CYST EXCISION  1995   under arms   endometrial biopsy  2004   benign   FOOT OSTEOTOMY  2010   right-spurs   LEFT HEART CATH AND CORONARY ANGIOGRAPHY N/A 10/09/2019   Procedure: LEFT HEART CATH AND CORONARY ANGIOGRAPHY;  Surgeon: Yates Decamp, MD;  [facility]: [facility];  Service: Cardiovascular;  Laterality: N/A;   TONSILLECTOMY     TOTAL KNEE ARTHROPLASTY Left 02/26/2023   Procedure: LEFT TOTAL KNEE ARTHROPLASTY;  Surgeon: Kathryne Hitch, MD;  Location: [facility];  Service: Orthopedics;  Laterality: Left;  Needs RNFA   ULNAR NERVE TRANSPOSITION Left 05/14/2014   Procedure: ULNAR NERVE DECOMPRESSION/TRANSPOSITION;  Surgeon: Jodi Marble, MD;  Location:  SURGERY CENTER;  Service: Orthopedics;  Laterality: Left;    Family History  Problem Relation Age of Onset   Hypertension Mother    Diabetes Mother    Heart attack Mother    Stroke Father    Diabetes Father    Diabetes Maternal Grandmother    Hypertension Maternal Grandmother    Heart attack Sister     Social History   Socioeconomic History   Marital status: Single    Spouse name: Not on file   Number of children: 2   Years of education: 12   Highest education level: High school graduate  Occupational History   Not on file  Tobacco Use   Smoking status: Never    Smokeless tobacco: Never  Vaping Use   Vaping status: Never Used  Substance and Sexual Activity   Alcohol use: Not Currently   Drug use: Yes    Types: Marijuana    Comment: sometimes   Sexual activity: Yes    Birth control/protection: None    Comment: same partner x 10 years  Other Topics Concern   Not on file  Social History Narrative   She works as a Arboriculturist.   Right-handed.   One cup caffeine per day.   She lives at home with daughter.   Social Determinants of Health   Financial Resource Strain: Not on file  Food Insecurity: No Food  Insecurity (05/31/2023)   Hunger Vital Sign    Worried About Running Out of Food in the Last Year: Never true    Ran Out of Food in the Last Year: Never true  Transportation Needs: No Transportation Needs (05/31/2023)   PRAPARE - Administrator, Civil Service (Medical): No    Lack of Transportation (Non-Medical): No  Physical Activity: Not on file  Stress: Not on file  Social Connections: Not on file  Intimate Partner Violence: Not At Risk (05/31/2023)   Humiliation, Afraid, Rape, and Kick questionnaire    Fear of Current or Ex-Partner: No    Emotionally Abused: No    Physically Abused: No    Sexually Abused: No    Review of Systems  All other systems reviewed and are negative.       Objective    There were no vitals taken for this visit.  Physical Exam Vitals and nursing note reviewed.  Constitutional:      General: She is not in acute distress. Cardiovascular:     Rate and Rhythm: Normal rate and regular rhythm.  Pulmonary:     Effort: Pulmonary effort is normal.     Breath sounds: Normal breath sounds.  Abdominal:     Palpations: Abdomen is soft.     Tenderness: There is no abdominal tenderness.  Musculoskeletal:     Comments: Utilizing aid for stability  Neurological:     General: No focal deficit present.     Mental Status: She is alert and oriented to person, place, and time.         Assessment &  Plan:   1. Fall, sequela Patient to follow up with consultant for further eval/mgt. Patient now trying to assess wether she can retire or do disability as she is not able to perform the duties of her job at this time.     Return if symptoms worsen or fail to improve.   Tommie Raymond, MD

## 2023-07-06 ENCOUNTER — Ambulatory Visit (INDEPENDENT_AMBULATORY_CARE_PROVIDER_SITE_OTHER): Payer: BC Managed Care – PPO | Admitting: Rehabilitative and Restorative Service Providers"

## 2023-07-06 ENCOUNTER — Encounter: Payer: Self-pay | Admitting: Rehabilitative and Restorative Service Providers"

## 2023-07-06 DIAGNOSIS — M6281 Muscle weakness (generalized): Secondary | ICD-10-CM | POA: Diagnosis not present

## 2023-07-06 DIAGNOSIS — R2689 Other abnormalities of gait and mobility: Secondary | ICD-10-CM

## 2023-07-06 DIAGNOSIS — M25562 Pain in left knee: Secondary | ICD-10-CM

## 2023-07-06 DIAGNOSIS — R2681 Unsteadiness on feet: Secondary | ICD-10-CM

## 2023-07-06 DIAGNOSIS — R600 Localized edema: Secondary | ICD-10-CM

## 2023-07-06 DIAGNOSIS — M25662 Stiffness of left knee, not elsewhere classified: Secondary | ICD-10-CM | POA: Diagnosis not present

## 2023-07-06 NOTE — Therapy (Addendum)
OUTPATIENT PHYSICAL THERAPY LOWER EXTREMITY TREATMENT / DISCHARGE   Patient Name: [name] MRN: [id number] DOB:03/30/1961, 62 y.o., female Today's Date: 07/06/2023  END OF SESSION:  PT End of Session - 07/06/23 0805     Visit Number 6    Number of Visits 6    Date for PT Re-Evaluation 07/21/23    Authorization Type [employer] State $72 copay    PT Start Time 0758    PT Stop Time 0847    PT Time Calculation (min) 49 min    Activity Tolerance Patient tolerated treatment well;Patient limited by fatigue    Behavior During Therapy [facility] for tasks assessed/performed                 Past Medical History:  Diagnosis Date   Arthritis    Campylobacter enteritis May 2012   [facility] admission   Carpal tunnel syndrome, bilateral    Diabetes mellitus    Gastritis with bleeding due to alcohol 2010   [facility] admission   GERD (gastroesophageal reflux disease)    Hyperlipidemia    Hypertension    no meds   Menopause    Myocardial infarction (HCC)    Neck pain    Wears glasses    Past Surgical History:  Procedure Laterality Date   CARPAL TUNNEL RELEASE Left 05/14/2014   Procedure: LEFT ULNAR NEUROPLASTY AT ELBOW AND ENDOSCOPIC CARPAL TUNNEL RELEASE;  Surgeon: Jodi Marble, MD;  Location: [facility];  Service: Orthopedics;  Laterality: Left;   CORONARY STENT INTERVENTION N/A 10/09/2019   Procedure: CORONARY STENT INTERVENTION;  Surgeon: Yates Decamp, MD;  Location: [facility];  Service: Cardiovascular;  Laterality: N/A;   CYST EXCISION  1995   under arms   endometrial biopsy  2004   benign   FOOT OSTEOTOMY  2010   right-spurs   LEFT HEART CATH AND CORONARY ANGIOGRAPHY N/A 10/09/2019   Procedure: LEFT HEART CATH AND CORONARY ANGIOGRAPHY;  Surgeon: Yates Decamp, MD;  Location: [facility];  Service: Cardiovascular;  Laterality: N/A;   TONSILLECTOMY     TOTAL KNEE ARTHROPLASTY Left 02/26/2023   Procedure: LEFT TOTAL KNEE ARTHROPLASTY;  Surgeon: Kathryne Hitch, MD;  Location: [facility];  Service: Orthopedics;  Laterality: Left;  Needs RNFA   ULNAR NERVE TRANSPOSITION Left 05/14/2014   Procedure: ULNAR NERVE DECOMPRESSION/TRANSPOSITION;  Surgeon: Jodi Marble, MD;  Location: [facility];  Service: Orthopedics;  Laterality: Left;   Patient Active Problem List   Diagnosis Date Noted   Mixed hyperlipidemia due to type 2 diabetes mellitus (HCC) 06/02/2023   Marijuana use, continuous 06/02/2023   Diabetic peripheral neuropathy associated with type 2 diabetes mellitus (HCC) 06/02/2023   Intractable vomiting with nausea 06/02/2023   Abdominal pain 06/01/2023   SIRS (systemic inflammatory response syndrome) (HCC) 06/01/2023   High anion gap metabolic acidosis 06/01/2023   Dehydration 06/01/2023   Acute prerenal azotemia 06/01/2023   Arthrofibrosis of total knee arthroplasty, subsequent encounter 04/07/2023   Status post left knee replacement 02/26/2023   Unilateral primary osteoarthritis, left knee 09/09/2022   Intractable nausea and vomiting 07/04/2022   Hematemesis 07/04/2022   Hypokalemia 07/04/2022   Lactic acidosis 07/04/2022   Pain due to onychomycosis of toenails of both feet 04/28/2022   Diabetic neuropathy, type II diabetes mellitus (HCC) 04/28/2022   De Quervain's tenosynovitis, left 11/13/2021   Bilateral injections given May 21, 2020 degenerative arthritis of knee, bilateral 05/21/2020   Traumatic hematoma of female breast  11/29/2019   Dizziness 11/29/2019   Hospital discharge follow-up 10/23/2019   NSTEMI (non-ST elevated myocardial infarction) (HCC) 10/10/2019   CAD (coronary artery disease) 10/10/2019   Chest pain 10/09/2019   Elevated troponin 10/09/2019   Uncontrolled type 2 diabetes mellitus with hyperglycemia (HCC) 10/09/2019   Obesity (BMI 30.0-34.9) 10/09/2019   Cervical spondylitis with radiculitis (HCC) 08/29/2019   Acute bursitis of left shoulder 01/30/2019   Chronic midline posterior neck  pain 06/28/2018   Urgency incontinence 06/28/2018   Degenerative joint disease of knee, left 04/26/2018   Carpal tunnel syndrome on both sides 03/29/2018   Anemia 01/02/2018   Class 1 obesity with serious comorbidity and body mass index (BMI) of 32.0 to 32.9 in adult 07/23/2017   Left hip pain 05/04/2017   Carpal tunnel syndrome on right 12/27/2016   Polyarthralgia 11/09/2016   Uncontrolled type 2 diabetes mellitus with hyperglycemia, with long-term current use of insulin (HCC) 07/07/2016   Visit for preventive health examination 06/21/2016   Increased urinary frequency 06/21/2016   Chronic meniscal tear of knee 08/19/2015   Left knee pain 06/19/2015   Candidiasis of skin 06/18/2015   Genital herpes 07/31/2014   Ulnar nerve compression 09/21/2013   Noncompliance with diet and medication regimen 11/15/2012   Boil, axilla 08/14/2012   Arthritis    Screening for breast cancer 08/04/2011   Screening for colon cancer 08/04/2011   HIDRADENITIS SUPPURATIVA 04/11/2007   GERD without esophagitis 11/17/2006   ENDOMETRIAL POLYP 11/17/2006   LOW BACK PAIN 11/17/2006   FIBROIDS, UTERUS 11/16/2006   Hyperlipidemia 11/16/2006   Essential hypertension 11/16/2006    PCP: Georganna Skeans, MD  REFERRING PROVIDER: Kathryne Hitch*  REFERRING DIAG: Z61.096 (ICD-10-CM) - Status post left knee replacement  THERAPY DIAG:  1. Acute pain of left knee  2. Stiffness of left knee, not elsewhere classified  3. Muscle weakness (generalized)  4. Other abnormalities of gait and mobility  5. Unsteadiness on feet  6. Localized edema       Rationale for Evaluation and Treatment: Rehabilitation  ONSET DATE: 02/26/23 (DOS)  SUBJECTIVE:   SUBJECTIVE STATEMENT: Pt indicated pain at worst 10/10 in last week.    PERTINENT HISTORY: OA, DM, HLD, HTN, MI s/p stent  PAIN:   NPRS scale: upon arrival moderate/severe Pain location: Lt knee, the entire knee Pain description: throbbing,  sore Aggravating factors: walking Relieving factors: resting it,  medications  PRECAUTIONS: Fall  WEIGHT BEARING RESTRICTIONS: No  FALLS:  Has patient fallen in last 6 months? Yes. 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    PALPATION:  LOWER EXTREMITY ROM:  ROM Left Eval In sitting Left 06/23/23  Knee flexion P: 96 A: 90 P:95 A:100  Knee extension P:10 A: 15 P:3 A:5   (Blank rows = not tested)  LOWER EXTREMITY MMT:    03/15/23: Not formally tested but grossly 2/5 Lt knee  MMT Right eval Left eval Left 06/23/23  Knee flexion  4 4+  Knee extension  4 4+   (  Blank rows = not tested)   GAIT: 07/06/2023:  SPC use in clinic and into/out of clinic.    Walker use reported when getting out and about.   Eval Distance walked: 100' Assistive device utilized: Environmental consultant - 2 wheeled Level of assistance: Mod I Comments: antalgic gait, decreased speed   TODAY'S TREATMENT:                                                                                                                              07/06/2023: Therapeutic Exercise: Nustep Lvl 5 with focus on increase RPM to walking pace - 10 mins  Leg press double leg 75 lbs 2 x 15 , Lt leg only 31 lbs 2 x 15  Knee extension machine double leg up, single leg Lt down 5 lbs 2 x 10  Additional time spent in review of process of selection and adjustment of machines at [facility] for successful use on own.  Seated Lt LAQ with end range  pause each direction 2 seconds x 15  Heel prop to tolerance with start of vaso 4 mins   Vaso left knee medium compression 34* 10 min with elevation with heel prop to tolerance  06/29/2023: Therapeutic Exercise: Nustep L6 with BLEs & BUEs moving seat closer for range:  seat 7 for 3 min, seat 6 for 3 min, then seat 5 for 5 min.   Leg press DL 53# [id number], then left leg only 25# 2X10 Seated knee extension machine DL 5# [id number] Seated hamstring curl machine 20# DL [id number] Tandem stance 1 min X 2 bilat with intermit UE support  Standing with feet together eyes closed with intermittent UE support 30 sec 2 reps  Seated left knee flexion assisted with RLE for increased range. 5 sec hold. 10 reps.  Vaso to left knee medium compression 34* 10 min with elevation  06/23/2023: Therapeutic Exercise: Nustep L6 with BLEs & BUEs moving seat closer for range:  seat 7 for 3 min, seat 6 for 3 min, then seat 5 for 3 min.  Reinforced verbal cues on using this progression at [facility] for ROM improvements. Pt verbalized understanding.  Seated LAQ using strap to assist end range ext. PT demo & verbal cues on modification. Pt return demo 10 reps & verbalized understanding. Seated left knee flexion assisted with RLE for increased range. 10 sec hold. 5 reps. Tandem stance 1 min X 2 bilat with intermit UE support in bars Standing alternate tapping 8" step 1 min. Standing with feet together eyes closed with intermittent touch counter for 30 sec 2 reps Supine heel slide with 18" ball & strap max flexion 5 sec hold 10 reps. Pt verbalized better understanding of HEP.  Manual therapy for left knee PROM flexion and extension but more time spent on flexion now that her extension has improved.    Vaso to left knee medium compression 34* 10 min with elevation  DATE:06/18/2023 Pt seen for aquatic therapy today.  Treatment  took place in water 3.5-4.75 ft in depth at the [facility] pool. Temp of water was 91.  Pt  entered/exited the pool via steps with hand rail   Pt requires the buoyancy and hydrostatic pressure of water for support, and to offload joints by unweighting joint load by at least 50 % in navel deep water and by at least 75-80% in chest to neck deep water.  Viscosity of the water is needed for resistance of strengthening. Water current perturbations provides challenge to standing balance requiring increased core activation.   Aquatic PT exercies Chest deep water for : Walking forward, walking backwards, lateral walking all holding onto floating barbell for support 3 round trips marches X 15 bilat hip abduction X 15 bilat Hip flexion/extension X 15 bilat Hamstring curls X 15 bilat Lunge step to wall with knee flexion stretch X 15 on Left Step ups onto first step of pool X 10  leading with left with bilat UE support Knee flexion stretch on 2nd step of pool 5 sec hold X 10     PATIENT EDUCATION:  Education details: HEP Person educated: Patient Education method: Programmer, multimedia, Demonstration, and Handouts Education comprehension: verbalized understanding, returned demonstration, and needs further education  HOME EXERCISE PROGRAM: Access Code: [email or web address] URL: [email or web address] Date: 06/09/2023 Prepared by: Ivery Quale  Exercises - Supine Heel Slide with Strap  - 5-10 x daily - 7 x weekly - 1 sets - 5-10 reps - Seated Long Arc Quad (Mirrored)  - 3-5 x daily - 7 x weekly - 1-2 sets - 10 reps - 2 hold - Seated Knee Flexion Extension AAROM with Overpressure (Mirrored)  - 5-6 x daily - 7 x weekly - 1 sets - 5 reps - 10 hold - Seated Straight Leg Heel Taps  - 1-2 x daily - 7 x weekly - 3 sets - 5-10 reps - Tandem Stance  - 2 x daily - 6 x weekly - 1 sets - 3 reps - 30 sec hold - Step Taps on High Step  - 2 x daily - 6 x weekly - 1 sets - 10 reps - Feet Together Balance at [employer] Eyes Closed  - 2 x daily - 6 x weekly - 1 sets - 3 reps - 15 sec hold  Aquatic PT  HEP Access Code: [email or web address] URL: [email or web address] Date: 06/18/2023 Prepared by: Ivery Quale  Exercises - Standing March at [facility]  - 1 x daily - 2 x weekly - 20 reps - Standing Hip Flexion Extension at [employer]  - 1 x daily - 2 x weekly - 20 reps - Standing Hip Abduction Adduction at Pool Wall  - 1 x daily - 2 x weekly - 20 reps - Forward Walking  - 1 x daily - 2 x weekly - 4 sets - Backward Walking  - 1 x daily - 2 x weekly - 4 sets - Side Stepping  - 2 x daily - 2 x weekly - 4 sets - 10 reps - Standing Knee Flexion  - 2 x daily - 2 x weekly - 1 sets - 20 reps - Squat  - 1 x daily - 2 x weekly - 15 reps - Lunge to Target at [employer]  - 1 x daily - 2 x weekly - 1 sets - 20 reps  ASSESSMENT:  CLINICAL IMPRESSION: Spent today's visit in review of [employer] machine work techniques and setup as well as emphasis on routine HEP for mobility/strength gains.  Pt to try  period of HEP/[employer] at this time.  Return per medical necessity.    OBJECTIVE IMPAIRMENTS: Abnormal gait, decreased balance, decreased endurance, decreased mobility, difficulty walking, decreased ROM, decreased strength, hypomobility, increased edema, increased fascial restrictions, increased muscle spasms, and pain.   ACTIVITY LIMITATIONS: carrying, lifting, bending, sitting, standing, squatting, sleeping, stairs, transfers, bed mobility, toileting, dressing, and locomotion level  PARTICIPATION LIMITATIONS: meal prep, cleaning, laundry, driving, shopping, community activity, and occupation  PERSONAL FACTORS: 3+ comorbidities: OA, DM, HLD, HTN, MI s/p stent  are also affecting patient's functional outcome.   REHAB POTENTIAL: Fair elevated pain, limited ROM and tolerance to activity  CLINICAL DECISION MAKING: Evolving/moderate complexity  EVALUATION COMPLEXITY: Moderate   GOALS: Goals reviewed with patient? Yes  LONG TERM GOALS: Target date: 07/21/2023    Independent with final HEP including balance  exercises Goal status: Ongoing 06/23/2023, she understands them but has not been doing them as often as recommended.    2.  Report pain < 5/10 with standing and walking for improved function Goal status: Ongoing 06/23/2023, still with high amounts of pain  3.  Demonstrate ability to amb with LRAD modified independent for improved mobility Goal status: Ongoing 06/23/2023, still with difficulty with this due to pain  4. Improve BERG balance test to 46 or better to show improved balance.  Goal status: Ongoing 06/23/2023, will retest at her last visit.    PLAN:  PT FREQUENCY:  1-2/wk  PT DURATION: 6 weeks  PLANNED INTERVENTIONS: Therapeutic exercises, Therapeutic activity, Neuromuscular re-education, Balance training, Gait training, Patient/Family education, Self Care, Joint mobilization, Stair training, DME instructions, Aquatic Therapy, Dry Needling, Electrical stimulation, Cryotherapy, Moist heat, Taping, Vasopneumatic device, Manual therapy, and Re-evaluation  PLAN FOR NEXT SESSION: Trial HEP period with [employer] use.    Chyrel Masson, PT, DPT, OCS, ATC 07/06/23  8:40 AM   PHYSICAL THERAPY DISCHARGE SUMMARY  Visits from Start of Care: 6  Current functional level related to goals / functional outcomes: See note   Remaining deficits: See note   Education / Equipment: HEP  Patient goals were partially met. Patient is being discharged due to not returning since the last visit.  Chyrel Masson, PT, DPT, OCS, ATC 12/29/23  1:59 PM

## 2023-07-13 ENCOUNTER — Encounter: Payer: Self-pay | Admitting: Gastroenterology

## 2023-07-21 ENCOUNTER — Telehealth: Payer: Self-pay | Admitting: Physician Assistant

## 2023-07-21 NOTE — Telephone Encounter (Signed)
Inbound call from patient requesting to have prep instructions resent through mychart today for her colonoscopy tomorrow 8/22. Please advise, thank you.

## 2023-07-21 NOTE — Telephone Encounter (Signed)
Instructions sent to mychart

## 2023-07-22 ENCOUNTER — Ambulatory Visit: Payer: BC Managed Care – PPO | Admitting: Gastroenterology

## 2023-07-22 ENCOUNTER — Encounter: Payer: Self-pay | Admitting: Gastroenterology

## 2023-07-22 VITALS — BP 154/84 | HR 65 | Temp 96.8°F | Resp 14 | Ht 63.0 in | Wt 153.0 lb

## 2023-07-22 DIAGNOSIS — K635 Polyp of colon: Secondary | ICD-10-CM | POA: Diagnosis not present

## 2023-07-22 DIAGNOSIS — D123 Benign neoplasm of transverse colon: Secondary | ICD-10-CM

## 2023-07-22 DIAGNOSIS — Z1211 Encounter for screening for malignant neoplasm of colon: Secondary | ICD-10-CM

## 2023-07-22 MED ORDER — SODIUM CHLORIDE 0.9 % IV SOLN
500.0000 mL | INTRAVENOUS | Status: DC
Start: 2023-07-22 — End: 2023-11-26

## 2023-07-22 NOTE — Progress Notes (Signed)
Patient states there have been no changes to medical or surgical history since time of pre-visit. 

## 2023-07-22 NOTE — Progress Notes (Signed)
Called to room to assist during endoscopic procedure.  Patient ID and intended procedure confirmed with present staff. Received instructions for my participation in the procedure from the performing physician.  

## 2023-07-22 NOTE — Progress Notes (Signed)
Difficult PIV stick; success at right hand with 22g. Very elevated blood glucose; volatile blood sugar. Decision made to proceed with case since patient felt fine; asymptomatic. Uneventful anesthetic. Report to pacu rn. Unable to obtain accurate BP in pacu d/t patient moving so much. Will recheck when patient able to cooperate. Otherwise, Vss. Care resumed by rn.

## 2023-07-22 NOTE — Progress Notes (Signed)
[facility] History and Physical   Primary Care Physician:  Georganna Skeans, MD   Reason for Procedure:   Colon cancer screening  Plan:    colonoscopy   HPI: [name] is a 62 y.o. female  here for colonoscopy - last exam in 2011, no report available. She was previously admitted early July with refractory nausea / vomiting. CT scan showed some mild thickening of her colon at the time but she has no diarrhea, in fact has issues with constipation.   She has DM, on numerous medications to treat this - her glucose is elevated in 300s. She is asymptomatic. She did not take her DM meds today. No family history of colon cancer known.   Otherwise feels well without any cardiopulmonary symptoms or complaints today.  I have discussed risks / benefits of anesthesia and endoscopic procedure with [name] and they wish to proceed with the exams as outlined today.    Past Medical History:  Diagnosis Date   Arthritis    Campylobacter enteritis May 2012   [facility] admission   Carpal tunnel syndrome, bilateral    Diabetes mellitus    Gastritis with bleeding due to alcohol 2010   [facility] admission   GERD (gastroesophageal reflux disease)    Hyperlipidemia    Hypertension    no meds   Menopause    Myocardial infarction [facility])    Neck pain    Wears glasses     Past Surgical History:  Procedure Laterality Date   CARPAL TUNNEL RELEASE Left 05/14/2014   Procedure: LEFT ULNAR NEUROPLASTY AT ELBOW AND ENDOSCOPIC CARPAL TUNNEL RELEASE;  Surgeon: Jodi Marble, MD;  Location: [facility];  Service: Orthopedics;  Laterality: Left;   CORONARY STENT INTERVENTION N/A 10/09/2019   Procedure: CORONARY STENT INTERVENTION;  Surgeon: Yates Decamp, MD;  Location: [facility];  Service: Cardiovascular;  Laterality: N/A;   CYST EXCISION  1995   under arms   endometrial biopsy  2004   benign   FOOT OSTEOTOMY  2010   right-spurs   LEFT HEART CATH AND CORONARY ANGIOGRAPHY  N/A 10/09/2019   Procedure: LEFT HEART CATH AND CORONARY ANGIOGRAPHY;  Surgeon: Yates Decamp, MD;  Location: [facility];  Service: Cardiovascular;  Laterality: N/A;   TONSILLECTOMY     TOTAL KNEE ARTHROPLASTY Left 02/26/2023   Procedure: LEFT TOTAL KNEE ARTHROPLASTY;  Surgeon: Kathryne Hitch, MD;  Location: [facility];  Service: Orthopedics;  Laterality: Left;  Needs RNFA   ULNAR NERVE TRANSPOSITION Left 05/14/2014   Procedure: ULNAR NERVE DECOMPRESSION/TRANSPOSITION;  Surgeon: Jodi Marble, MD;  Location: [facility];  Service: Orthopedics;  Laterality: Left;    Prior to Admission medications   Medication Sig Start Date End Date Taking? Authorizing Provider  Ascorbic Acid (VITAMIN C PO) Take 1 tablet by mouth daily.   Yes [provider]  aspirin 81 MG chewable tablet Chew 1 tablet (81 mg total) by mouth 2 (two) times daily. 02/27/23  Yes Kathryne Hitch, MD  atorvastatin (LIPITOR) 40 MG tablet Take 1 tablet (40 mg total) by mouth daily. 05/25/23  Yes Georganna Skeans, MD  Blood Glucose Monitoring Suppl (ACCU-CHEK AVIVA PLUS) w/Device KIT Use to check blood sugar 2 times a day 06/04/23  Yes Georganna Skeans, MD  Continuous Glucose Sensor (FREESTYLE LIBRE 3 SENSOR) MISC APPLY 1 SENSOR EVERY 14 DAYS 05/25/23  Yes Georganna Skeans, MD  cyanocobalamin (VITAMIN B12) 1000 MCG tablet Take 1 tablet (1,000  mcg total) by mouth daily. 05/25/23  Yes Georganna Skeans, MD  FARXIGA 10 MG TABS tablet Take 1 tablet (10 mg total) by mouth daily. 05/25/23  Yes Georganna Skeans, MD  gabapentin (NEURONTIN) 600 MG tablet Take 1 tablet (600 mg total) by mouth 3 (three) times daily. Patient taking differently: Take 600 mg by mouth daily. 05/25/23  Yes Georganna Skeans, MD  glucose blood (BAYER CONTOUR TEST) test strip TEST BLOOD SUGARS 3 TIMES DAILY 05/25/23  Yes Georganna Skeans, MD  glucose blood test strip Use to check blood sugar 2 times a day 06/04/23  Yes Georganna Skeans, MD   HYDROcodone-acetaminophen (NORCO/VICODIN) 5-325 MG tablet Take 1 tablet by mouth every 6 (six) hours as needed for moderate pain. Patient taking differently: Take 1 tablet by mouth as needed for moderate pain. 04/29/23  Yes Kathryne Hitch, MD  HYDROmorphone (DILAUDID) 2 MG tablet Take 2 mg by mouth as needed for severe pain.   Yes [provider]  hydrOXYzine (ATARAX) 25 MG tablet Take 1 tablet (25 mg total) by mouth 3 (three) times daily as needed. itching Patient taking differently: Take 25 mg by mouth as needed for anxiety. itching 05/25/23  Yes Georganna Skeans, MD  insulin aspart (FIASP FLEXTOUCH) 100 UNIT/ML FlexTouch Pen Inject 6-20 Units into the skin 3 (three) times daily before meals. Patient taking differently: Inject 32 Units into the skin daily. 05/25/23  Yes Georganna Skeans, MD  insulin degludec (TRESIBA FLEXTOUCH) 200 UNIT/ML FlexTouch Pen INJECT 30 TO 34 UNITS SUBCUTANEOUSLY ONCE DAILY Strength: 200 UNIT/ML Patient taking differently: Inject 30-34 Units into the skin daily. Depending on BS 05/25/23  Yes Georganna Skeans, MD  Insulin Pen Needle 32G X 4 MM MISC Use 4x a day 05/25/23  Yes Georganna Skeans, MD  Lancet Devices (EASY TOUCH LANCING DEVICE) MISC Use to check diabetes 4 times daily  E 11.65 05/25/23  Yes Georganna Skeans, MD  latanoprost (XALATAN) 0.005 % ophthalmic solution Place 1 drop into both eyes at bedtime. 12/02/18  Yes [provider]  metFORMIN (GLUCOPHAGE) 1000 MG tablet Take 1 tablet (1,000 mg total) by mouth daily with supper. 05/25/23  Yes Georganna Skeans, MD  metoCLOPramide (REGLAN) 10 MG tablet Take 1 tablet (10 mg total) by mouth every 8 (eight) hours as needed for nausea. 06/04/23  Yes Calvert Cantor, MD  metoprolol succinate (TOPROL-XL) 50 MG 24 hr tablet TAKE 1 TABLET BY MOUTH ONCE DAILY. TAKE  WITH  OR  IMMEDIATELY  FOLLOWING  A  MEAL 05/25/23  Yes Georganna Skeans, MD  Na Sulfate-K Sulfate-Mg Sulf 17.5-3.13-1.6 GM/177ML SOLN Take by mouth. 06/22/23  Yes  [provider]  ondansetron (ZOFRAN-ODT) 8 MG disintegrating tablet Take 1 tablet (8 mg total) by mouth every 8 (eight) hours as needed for nausea or vomiting. 05/31/23  Yes Cathren Laine, MD  pantoprazole (PROTONIX) 40 MG tablet Take 1 tablet (40 mg total) by mouth 2 (two) times daily. 06/04/23  Yes Calvert Cantor, MD  tiZANidine (ZANAFLEX) 4 MG tablet Take 1 tablet (4 mg total) by mouth every 6 (six) hours as needed for muscle spasms. Patient taking differently: Take 4 mg by mouth daily. 05/25/23  Yes Georganna Skeans, MD  triamcinolone cream (KENALOG) 0.1 % Apply 1 Application topically daily.   Yes [provider]  naloxone [facility]) nasal spray 4 mg/0.1 mL Place 1 spray into the nose as needed (or). 12/09/20   [provider]    Current Outpatient Medications  Medication Sig Dispense Refill   Ascorbic Acid (  VITAMIN C PO) Take 1 tablet by mouth daily.     aspirin 81 MG chewable tablet Chew 1 tablet (81 mg total) by mouth 2 (two) times daily. 30 tablet 0   atorvastatin (LIPITOR) 40 MG tablet Take 1 tablet (40 mg total) by mouth daily. 90 tablet 3   Blood Glucose Monitoring Suppl (ACCU-CHEK AVIVA PLUS) w/Device KIT Use to check blood sugar 2 times a day 1 kit 0   Continuous Glucose Sensor (FREESTYLE LIBRE 3 SENSOR) MISC APPLY 1 SENSOR EVERY 14 DAYS 2 each 3   cyanocobalamin (VITAMIN B12) 1000 MCG tablet Take 1 tablet (1,000 mcg total) by mouth daily. 90 tablet 0   FARXIGA 10 MG TABS tablet Take 1 tablet (10 mg total) by mouth daily. 90 tablet 0   gabapentin (NEURONTIN) 600 MG tablet Take 1 tablet (600 mg total) by mouth 3 (three) times daily. (Patient taking differently: Take 600 mg by mouth daily.) 90 tablet 5   glucose blood (BAYER CONTOUR TEST) test strip TEST BLOOD SUGARS 3 TIMES DAILY 200 each 11   glucose blood test strip Use to check blood sugar 2 times a day 100 each 12   HYDROcodone-acetaminophen (NORCO/VICODIN) 5-325 MG tablet Take 1 tablet by mouth every 6 (six)  hours as needed for moderate pain. (Patient taking differently: Take 1 tablet by mouth as needed for moderate pain.) 30 tablet 0   HYDROmorphone (DILAUDID) 2 MG tablet Take 2 mg by mouth as needed for severe pain.     hydrOXYzine (ATARAX) 25 MG tablet Take 1 tablet (25 mg total) by mouth 3 (three) times daily as needed. itching (Patient taking differently: Take 25 mg by mouth as needed for anxiety. itching) 30 tablet 0   insulin aspart (FIASP FLEXTOUCH) 100 UNIT/ML FlexTouch Pen Inject 6-20 Units into the skin 3 (three) times daily before meals. (Patient taking differently: Inject 32 Units into the skin daily.) 30 mL 3   insulin degludec (TRESIBA FLEXTOUCH) 200 UNIT/ML FlexTouch Pen INJECT 30 TO 34 UNITS SUBCUTANEOUSLY ONCE DAILY Strength: 200 UNIT/ML (Patient taking differently: Inject 30-34 Units into the skin daily. Depending on BS) 9 mL 0   Insulin Pen Needle 32G X 4 MM MISC Use 4x a day 300 each 3   Lancet Devices (EASY TOUCH LANCING DEVICE) MISC Use to check diabetes 4 times daily  E 11.65 1 each 11   latanoprost (XALATAN) 0.005 % ophthalmic solution Place 1 drop into both eyes at bedtime.     metFORMIN (GLUCOPHAGE) 1000 MG tablet Take 1 tablet (1,000 mg total) by mouth daily with supper. 90 tablet 3   metoCLOPramide (REGLAN) 10 MG tablet Take 1 tablet (10 mg total) by mouth every 8 (eight) hours as needed for nausea. 30 tablet 0   metoprolol succinate (TOPROL-XL) 50 MG 24 hr tablet TAKE 1 TABLET BY MOUTH ONCE DAILY. TAKE  WITH  OR  IMMEDIATELY  FOLLOWING  A  MEAL 90 tablet 1   Na Sulfate-K Sulfate-Mg Sulf 17.5-3.13-1.6 GM/177ML SOLN Take by mouth.     ondansetron (ZOFRAN-ODT) 8 MG disintegrating tablet Take 1 tablet (8 mg total) by mouth every 8 (eight) hours as needed for nausea or vomiting. 10 tablet 0   pantoprazole (PROTONIX) 40 MG tablet Take 1 tablet (40 mg total) by mouth 2 (two) times daily. 60 tablet 0   tiZANidine (ZANAFLEX) 4 MG tablet Take 1 tablet (4 mg total) by mouth every 6 (six)  hours as needed for muscle spasms. (Patient taking differently: Take 4 mg  by mouth daily.) 30 tablet 0   triamcinolone cream (KENALOG) 0.1 % Apply 1 Application topically daily.     naloxone (NARCAN) nasal spray 4 mg/0.1 mL Place 1 spray into the nose as needed (or).     Current Facility-Administered Medications  Medication Dose Route Frequency Provider Last Rate Last Admin   0.9 %  sodium chloride infusion  500 mL Intravenous Continuous Ranika Mcniel, Willaim Rayas, MD        Allergies as of 07/22/2023 - Review Complete 07/06/2023  Allergen Reaction Noted   Penicillins Rash 01/31/2013    Family History  Problem Relation Age of Onset   Hypertension Mother    Diabetes Mother    Heart attack Mother    Stroke Father    Diabetes Father    Heart attack Sister    Diabetes Maternal Grandmother    Hypertension Maternal Grandmother    Colon cancer Neg Hx    Esophageal cancer Neg Hx    Rectal cancer Neg Hx    Stomach cancer Neg Hx     Social History   Socioeconomic History   Marital status: Single    Spouse name: Not on file   Number of children: 2   Years of education: 12   Highest education level: High school graduate  Occupational History   Not on file  Tobacco Use   Smoking status: Never   Smokeless tobacco: Never  Vaping Use   Vaping status: Never Used  Substance and Sexual Activity   Alcohol use: Not Currently   Drug use: Yes    Types: Marijuana    Comment: sometimes; last use on Tuesday, 07/20/23   Sexual activity: Yes    Birth control/protection: None    Comment: same partner x 10 years  Other Topics Concern   Not on file  Social History Narrative   She works as a Arboriculturist.   Right-handed.   One cup caffeine per day.   She lives at home with daughter.   Social Determinants of Health   Financial Resource Strain: Not on file  Food Insecurity: No Food Insecurity (05/31/2023)   Hunger Vital Sign    Worried About Running Out of Food in the Last Year: Never true     Ran Out of Food in the Last Year: Never true  Transportation Needs: No Transportation Needs (05/31/2023)   PRAPARE - Administrator, Civil Service (Medical): No    Lack of Transportation (Non-Medical): No  Physical Activity: Not on file  Stress: Not on file  Social Connections: Not on file  Intimate Partner Violence: Not At Risk (05/31/2023)   Humiliation, Afraid, Rape, and Kick questionnaire    Fear of Current or Ex-Partner: No    Emotionally Abused: No    Physically Abused: No    Sexually Abused: No    Review of Systems: All other review of systems negative except as mentioned in the HPI.  Physical Exam: Vital signs BP 115/63   Pulse 74   Temp (!) 96.8 F (36 C) (Skin)   Ht 5\' 3"  (1.6 m)   Wt 153 lb (69.4 kg)   SpO2 100%   BMI 27.10 kg/m   General:   Alert,  Well-developed, pleasant and cooperative in NAD Lungs:  Clear throughout to auscultation.   Heart:  Regular rate and rhythm Abdomen:  Soft, nontender and nondistended.   Neuro/Psych:  Alert and cooperative. Normal mood and affect. A and O x 3  Harlin Rain, MD [facility]

## 2023-07-22 NOTE — Progress Notes (Signed)
Patient very wiggly in bed. IV site is slightly swollen upon IV removal. Patient c/o of pain at IV site. Applied Ice/heat and elevated. Swelling improved prior to discharge.

## 2023-07-22 NOTE — Patient Instructions (Signed)
Resume previous diet and medications. Awaiting pathology results. Repeat Colonoscopy date to be determined based on pathology results.  Handouts provided on Colon polyps and Diverticulosis.  YOU HAD AN ENDOSCOPIC PROCEDURE TODAY AT THE [facility]:   Refer to the procedure report that was given to you for any specific questions about what was found during the examination.  If the procedure report does not answer your questions, please call your gastroenterologist to clarify.  If you requested that your care partner not be given the details of your procedure findings, then the procedure report has been included in a sealed envelope for you to review at your convenience later.  YOU SHOULD EXPECT: Some feelings of bloating in the abdomen. Passage of more gas than usual.  Walking can help get rid of the air that was put into your GI tract during the procedure and reduce the bloating. If you had a lower endoscopy (such as a colonoscopy or flexible sigmoidoscopy) you may notice spotting of blood in your stool or on the toilet paper. If you underwent a bowel prep for your procedure, you may not have a normal bowel movement for a few days.  Please Note:  You might notice some irritation and congestion in your nose or some drainage.  This is from the oxygen used during your procedure.  There is no need for concern and it should clear up in a day or so.  SYMPTOMS TO REPORT IMMEDIATELY:  Following lower endoscopy (colonoscopy or flexible sigmoidoscopy):  Excessive amounts of blood in the stool  Significant tenderness or worsening of abdominal pains  Swelling of the abdomen that is new, acute  Fever of 100F or higher  For urgent or emergent issues, a gastroenterologist can be reached at any hour by calling [phone]. Do not use MyChart messaging for urgent concerns.    DIET:  We do recommend a small meal at first, but then you may proceed to your regular diet.  Drink plenty of fluids but  you should avoid alcoholic beverages for 24 hours.  ACTIVITY:  You should plan to take it easy for the rest of today and you should NOT DRIVE or use heavy machinery until tomorrow (because of the sedation medicines used during the test).    FOLLOW UP: Our staff will call the number listed on your records the next business day following your procedure.  We will call around 7:15- 8:00 am to check on you and address any questions or concerns that you may have regarding the information given to you following your procedure. If we do not reach you, we will leave a message.     If any biopsies were taken you will be contacted by phone or by letter within the next 1-3 weeks.  Please call us at [phone] if you have not heard about the biopsies in 3 weeks.    SIGNATURES/CONFIDENTIALITY: You and/or your care partner have signed paperwork which will be entered into your electronic medical record.  These signatures attest to the fact that that the information above on your After Visit Summary has been reviewed and is understood.  Full responsibility of the confidentiality of this discharge information lies with you and/or your care-partner. 

## 2023-07-22 NOTE — Op Note (Signed)
[facility] Patient Name: [name] Procedure Date: 07/22/2023 11:34 AM MRN: [id number] Endoscopist: [name] , MD, [id number] Age: 62 Referring MD:  Date of Birth: 06/03/1961 Gender: Female Account #: [id number] Procedure:                Colonoscopy Indications:              Screening for colorectal malignant neoplasm Medicines:                Monitored Anesthesia Care Procedure:                Pre-Anesthesia Assessment:                           - Prior to the procedure, a History and Physical                            was performed, and patient medications and                            allergies were reviewed. The patient's tolerance of                            previous anesthesia was also reviewed. The risks                            and benefits of the procedure and the sedation                            options and risks were discussed with the patient.                            All questions were answered, and informed consent                            was obtained. Prior Anticoagulants: The patient has                            taken no anticoagulant or antiplatelet agents. ASA                            Grade Assessment: III - A patient with severe                            systemic disease. After reviewing the risks and                            benefits, the patient was deemed in satisfactory                            condition to undergo the procedure.                           After obtaining informed consent, the colonoscope  was passed under direct vision. Throughout the                            procedure, the patient's blood pressure, pulse, and                            oxygen saturations were monitored continuously. The                            Olympus Scope [id number] was introduced through the                            anus and advanced to the the cecum, identified by                             appendiceal orifice and ileocecal valve. The                            colonoscopy was performed without difficulty. The                            patient tolerated the procedure well. The quality                            of the bowel preparation was good. The ileocecal                            valve, appendiceal orifice, and rectum were                            photographed. Scope In: 11:40:26 AM Scope Out: 12:04:49 PM Scope Withdrawal Time: 0 hours 20 minutes 53 seconds  Total Procedure Duration: 0 hours 24 minutes 23 seconds  Findings:                 The perianal and digital rectal examinations were                            normal.                           Scattered small-mouthed diverticula were found in                            the transverse colon and left colon.                           Two flat and sessile polyps were found in the                            transverse colon. The polyps were 2 to 3 mm in                            size. These polyps were removed with a cold snare.  Resection and retrieval were complete.                           The exam was otherwise without abnormality. Of                            note, colon did not retain air well which led to                            significant spasm, and prolonged with drawal of the                            left colon. Retroflexed views of the rectum not                            able to be done due to poor air retention. Complications:            No immediate complications. Estimated blood loss:                            Minimal. Estimated Blood Loss:     Estimated blood loss was minimal. Impression:               - Diverticulosis in the transverse colon and in the                            left colon.                           - Two 2 to 3 mm polyps in the transverse colon,                            removed with a cold snare. Resected and retrieved.                            - The examination was otherwise normal. Recommendation:           - Patient has a contact number available for                            emergencies. The signs and symptoms of potential                            delayed complications were discussed with the                            patient. Return to normal activities tomorrow.                            Written discharge instructions were provided to the                            patient.                           -  Resume previous diet.                           - Continue present medications.                           - Await pathology results. Wendy Spare P. Wendy Batton, MD 07/22/2023 12:10:15 PM This report has been signed electronically.

## 2023-07-23 ENCOUNTER — Telehealth: Payer: Self-pay | Admitting: *Deleted

## 2023-07-23 NOTE — Telephone Encounter (Signed)
  Follow up Call-     07/22/2023   10:55 AM  Call back number  Post procedure Call Back phone  # [phone]  Permission to leave phone message Yes     Patient questions:  Do you have a fever, pain , or abdominal swelling? No. Pain Score  0 *  Have you tolerated food without any problems? Yes.    Have you been able to return to your normal activities? Yes.    Do you have any questions about your discharge instructions: Diet   No. Medications  No. Follow up visit  No.  Do you have questions or concerns about your Care? No.  Actions: * If pain score is 4 or above: No action needed, pain <4.

## 2023-08-03 ENCOUNTER — Encounter: Payer: Self-pay | Admitting: Gastroenterology

## 2023-08-17 ENCOUNTER — Ambulatory Visit: Payer: BC Managed Care – PPO | Admitting: Family Medicine

## 2023-08-20 ENCOUNTER — Other Ambulatory Visit: Payer: Self-pay | Admitting: Family Medicine

## 2023-08-20 ENCOUNTER — Telehealth: Payer: Self-pay | Admitting: *Deleted

## 2023-08-20 NOTE — Telephone Encounter (Signed)
Medication refill

## 2023-08-20 NOTE — Telephone Encounter (Signed)
[facility] called and spoke to [name], [facility] about the refill(s) gabapentin requested. Advised it was sent on 05/25/23 #90/5 refill(s). She says they have it, but unable to refill until Sunday 08/22/23 because she picked up on 07/25/23.

## 2023-08-20 NOTE — Telephone Encounter (Unsigned)
Copied from CRM [id number]. Topic: General - Other >> Aug 20, 2023 10:46 AM Everette C wrote: Reason for CRM: Medication Refill - Medication: pantoprazole (PROTONIX) 40 MG tablet [[id number]]  gabapentin (NEURONTIN) 600 MG tablet [[id number]]  Has the patient contacted their pharmacy? Yes.   (Agent: If no, request that the patient contact the pharmacy for the refill. If patient does not wish to contact the pharmacy document the reason why and proceed with request.) (Agent: If yes, when and what did the pharmacy advise?)  Preferred Pharmacy (with phone number or street name): [facility] [address] [place], [place] - [address] [place] [address] Phone: [phone] Fax: [phone] Hours: Not open 24 hours   Has the patient been seen for an appointment in the last year OR does the patient have an upcoming appointment? Yes.    Agent: Please be advised that RX refills may take up to 3 business days. We ask that you follow-up with your pharmacy.

## 2023-08-23 ENCOUNTER — Encounter: Payer: Self-pay | Admitting: Family Medicine

## 2023-08-23 ENCOUNTER — Ambulatory Visit (INDEPENDENT_AMBULATORY_CARE_PROVIDER_SITE_OTHER): Payer: BC Managed Care – PPO | Admitting: Family Medicine

## 2023-08-23 VITALS — BP 129/80 | HR 71 | Temp 98.1°F | Resp 16 | Ht 63.0 in | Wt 153.0 lb

## 2023-08-23 DIAGNOSIS — R350 Frequency of micturition: Secondary | ICD-10-CM

## 2023-08-23 DIAGNOSIS — E1169 Type 2 diabetes mellitus with other specified complication: Secondary | ICD-10-CM

## 2023-08-23 DIAGNOSIS — R3915 Urgency of urination: Secondary | ICD-10-CM | POA: Diagnosis not present

## 2023-08-23 DIAGNOSIS — Z794 Long term (current) use of insulin: Secondary | ICD-10-CM

## 2023-08-23 LAB — POCT GLYCOSYLATED HEMOGLOBIN (HGB A1C): Hemoglobin A1C: 14.4 % — AB (ref 4.0–5.6)

## 2023-08-23 MED ORDER — FARXIGA 10 MG PO TABS: 10.0000 mg | ORAL_TABLET | Freq: Every day | ORAL | 0 refills | Status: DC

## 2023-08-23 MED ORDER — METFORMIN HCL 1000 MG PO TABS: 1000.0000 mg | ORAL_TABLET | Freq: Every day | ORAL | 3 refills | Status: DC

## 2023-08-23 MED ORDER — PANTOPRAZOLE SODIUM 40 MG PO TBEC: 40.0000 mg | DELAYED_RELEASE_TABLET | Freq: Two times a day (BID) | ORAL | 0 refills | Status: DC

## 2023-08-23 NOTE — Telephone Encounter (Signed)
Refused protonix because this is a duplicate.   Dr. Andrey Campanile reordered this earlier today during pt's OV.

## 2023-08-23 NOTE — Progress Notes (Unsigned)
Patient concerns having uncontrollable urine issues. Patient said she is using 4 depends at night. Patient also is having constipation concerns.

## 2023-08-24 ENCOUNTER — Ambulatory Visit (INDEPENDENT_AMBULATORY_CARE_PROVIDER_SITE_OTHER): Payer: BC Managed Care – PPO | Admitting: Podiatry

## 2023-08-24 ENCOUNTER — Encounter: Payer: Self-pay | Admitting: Podiatry

## 2023-08-24 DIAGNOSIS — M79675 Pain in left toe(s): Secondary | ICD-10-CM | POA: Diagnosis not present

## 2023-08-24 DIAGNOSIS — B351 Tinea unguium: Secondary | ICD-10-CM

## 2023-08-24 DIAGNOSIS — M79674 Pain in right toe(s): Secondary | ICD-10-CM | POA: Diagnosis not present

## 2023-08-24 DIAGNOSIS — E1140 Type 2 diabetes mellitus with diabetic neuropathy, unspecified: Secondary | ICD-10-CM | POA: Diagnosis not present

## 2023-08-24 LAB — HM DIABETES FOOT EXAM

## 2023-08-24 NOTE — Progress Notes (Signed)
This patient returns to my office for at risk foot care.  This patient requires this care by a professional since this patient will be at risk due to having diabetic neuropathy.  This patient is unable to cut nails herself since the patient cannot reach her nails.These nails are painful walking and wearing shoes.  This patient presents for at risk foot care today.  General Appearance  Alert, conversant and in no acute stress.  Vascular  Dorsalis pedis and posterior tibial  pulses are palpable  bilaterally.  Capillary return is within normal limits  bilaterally. Temperature is within normal limits  bilaterally.  Neurologic  Senn-Weinstein monofilament wire test diminished  bilaterally. Muscle power within normal limits bilaterally.  Nails Thick disfigured discolored nails with subungual debris  from hallux to fifth toes bilaterally. No evidence of bacterial infection or drainage bilaterally.  Orthopedic  No limitations of motion  feet .  No crepitus or effusions noted.  No bony pathology or digital deformities noted.  Skin  normotropic skin with no porokeratosis noted bilaterally.  No signs of infections or ulcers noted.     Onychomycosis  Pain in right toes  Pain in left toes  Consent was obtained for treatment procedures.   Mechanical debridement of nails 1-5  bilaterally performed with a nail nipper.  Filed with dremel without incident.    Return office visit   9 weeks                  Told patient to return for periodic foot care and evaluation due to potential at risk complications.   Helane Gunther DPM

## 2023-08-25 ENCOUNTER — Encounter: Payer: Self-pay | Admitting: Family Medicine

## 2023-08-25 NOTE — Progress Notes (Signed)
Established Patient Office Visit  Subjective    Patient ID: [name], female    DOB: 1961/03/15  Age: 62 y.o. MRN: [id number]  CC:  Chief Complaint  Patient presents with   Medical Management of Chronic Issues    Patient concerns having uncontrollable urine issues. Patient said she is using 4 depends at night. Patient also is having constipation concerns.     HPI [name] presents with complaint of having uncontrolled urine issues at night. She has to go frequently and goes through 4 depends a night. She also reports that she does have some issues during the day but that they don't seem as urgent.   Outpatient Encounter Medications as of 08/23/2023  Medication Sig   Ascorbic Acid (VITAMIN C PO) Take 1 tablet by mouth daily.   aspirin 81 MG chewable tablet Chew 1 tablet (81 mg total) by mouth 2 (two) times daily.   atorvastatin (LIPITOR) 40 MG tablet Take 1 tablet (40 mg total) by mouth daily.   Blood Glucose Monitoring Suppl (ACCU-CHEK AVIVA PLUS) w/Device KIT Use to check blood sugar 2 times a day   Continuous Glucose Sensor (FREESTYLE LIBRE 3 SENSOR) MISC APPLY 1 SENSOR EVERY 14 DAYS   cyanocobalamin (VITAMIN B12) 1000 MCG tablet Take 1 tablet (1,000 mcg total) by mouth daily.   gabapentin (NEURONTIN) 600 MG tablet Take 1 tablet (600 mg total) by mouth 3 (three) times daily. (Patient taking differently: Take 600 mg by mouth daily.)   glucose blood (BAYER CONTOUR TEST) test strip TEST BLOOD SUGARS 3 TIMES DAILY   glucose blood test strip Use to check blood sugar 2 times a day   HYDROcodone-acetaminophen (NORCO/VICODIN) 5-325 MG tablet Take 1 tablet by mouth every 6 (six) hours as needed for moderate pain. (Patient taking differently: Take 1 tablet by mouth as needed for moderate pain.)   HYDROmorphone (DILAUDID) 2 MG tablet Take 2 mg by mouth as needed for severe pain.   hydrOXYzine (ATARAX) 25 MG tablet Take 1 tablet (25 mg total) by mouth 3 (three) times daily as needed.  itching (Patient taking differently: Take 25 mg by mouth as needed for anxiety. itching)   insulin aspart (FIASP FLEXTOUCH) 100 UNIT/ML FlexTouch Pen Inject 6-20 Units into the skin 3 (three) times daily before meals. (Patient taking differently: Inject 32 Units into the skin daily.)   insulin degludec (TRESIBA FLEXTOUCH) 200 UNIT/ML FlexTouch Pen INJECT 30 TO 34 UNITS SUBCUTANEOUSLY ONCE DAILY Strength: 200 UNIT/ML (Patient taking differently: Inject 30-34 Units into the skin daily. Depending on BS)   Insulin Pen Needle 32G X 4 MM MISC Use 4x a day   Lancet Devices (EASY TOUCH LANCING DEVICE) MISC Use to check diabetes 4 times daily  E 11.65   latanoprost (XALATAN) 0.005 % ophthalmic solution Place 1 drop into both eyes at bedtime.   metoCLOPramide (REGLAN) 10 MG tablet Take 1 tablet (10 mg total) by mouth every 8 (eight) hours as needed for nausea.   metoprolol succinate (TOPROL-XL) 50 MG 24 hr tablet TAKE 1 TABLET BY MOUTH ONCE DAILY. TAKE  WITH  OR  IMMEDIATELY  FOLLOWING  A  MEAL   Na Sulfate-K Sulfate-Mg Sulf 17.5-3.13-1.6 GM/177ML SOLN Take by mouth.   naloxone (NARCAN) nasal spray 4 mg/0.1 mL Place 1 spray into the nose as needed (or).   ondansetron (ZOFRAN-ODT) 8 MG disintegrating tablet Take 1 tablet (8 mg total) by mouth every 8 (eight) hours as needed for nausea or vomiting.   tiZANidine (  ZANAFLEX) 4 MG tablet Take 1 tablet (4 mg total) by mouth every 6 (six) hours as needed for muscle spasms. (Patient taking differently: Take 4 mg by mouth daily.)   triamcinolone cream (KENALOG) 0.1 % Apply 1 Application topically daily.   [DISCONTINUED] FARXIGA 10 MG TABS tablet Take 1 tablet (10 mg total) by mouth daily.   [DISCONTINUED] metFORMIN (GLUCOPHAGE) 1000 MG tablet Take 1 tablet (1,000 mg total) by mouth daily with supper.   [DISCONTINUED] pantoprazole (PROTONIX) 40 MG tablet Take 1 tablet (40 mg total) by mouth 2 (two) times daily.   FARXIGA 10 MG TABS tablet Take 1 tablet (10 mg total) by  mouth daily.   metFORMIN (GLUCOPHAGE) 1000 MG tablet Take 1 tablet (1,000 mg total) by mouth daily with supper.   pantoprazole (PROTONIX) 40 MG tablet Take 1 tablet (40 mg total) by mouth 2 (two) times daily.   Facility-Administered Encounter Medications as of 08/23/2023  Medication   0.9 %  sodium chloride infusion    Past Medical History:  Diagnosis Date   Arthritis    Campylobacter enteritis May 2012   [facility] admission   Carpal tunnel syndrome, bilateral    Diabetes mellitus    Gastritis with bleeding due to alcohol 2010   [facility] admission   GERD (gastroesophageal reflux disease)    Hyperlipidemia    Hypertension    no meds   Menopause    Myocardial infarction [facility])    Neck pain    Wears glasses     Past Surgical History:  Procedure Laterality Date   CARPAL TUNNEL RELEASE Left 05/14/2014   Procedure: LEFT ULNAR NEUROPLASTY AT ELBOW AND ENDOSCOPIC CARPAL TUNNEL RELEASE;  Surgeon: Jodi Marble, MD;  Location: [facility];  Service: Orthopedics;  Laterality: Left;   CORONARY STENT INTERVENTION N/A 10/09/2019   Procedure: CORONARY STENT INTERVENTION;  Surgeon: Yates Decamp, MD;  Location: [facility];  Service: Cardiovascular;  Laterality: N/A;   CYST EXCISION  1995   under arms   endometrial biopsy  2004   benign   FOOT OSTEOTOMY  2010   right-spurs   LEFT HEART CATH AND CORONARY ANGIOGRAPHY N/A 10/09/2019   Procedure: LEFT HEART CATH AND CORONARY ANGIOGRAPHY;  Surgeon: Yates Decamp, MD;  Location: [facility];  Service: Cardiovascular;  Laterality: N/A;   TONSILLECTOMY     TOTAL KNEE ARTHROPLASTY Left 02/26/2023   Procedure: LEFT TOTAL KNEE ARTHROPLASTY;  Surgeon: Kathryne Hitch, MD;  Location: [facility];  Service: Orthopedics;  Laterality: Left;  Needs RNFA   ULNAR NERVE TRANSPOSITION Left 05/14/2014   Procedure: ULNAR NERVE DECOMPRESSION/TRANSPOSITION;  Surgeon: Jodi Marble, MD;  Location:  SURGERY CENTER;  Service: Orthopedics;   Laterality: Left;    Family History  Problem Relation Age of Onset   Hypertension Mother    Diabetes Mother    Heart attack Mother    Stroke Father    Diabetes Father    Heart attack Sister    Diabetes Maternal Grandmother    Hypertension Maternal Grandmother    Colon cancer Neg Hx    Esophageal cancer Neg Hx    Rectal cancer Neg Hx    Stomach cancer Neg Hx     Social History   Socioeconomic History   Marital status: Single    Spouse name: Not on file   Number of children: 2   Years of education: 12   Highest education level: High school graduate  Occupational History   Not  on file  Tobacco Use   Smoking status: Never   Smokeless tobacco: Never  Vaping Use   Vaping status: Never Used  Substance and Sexual Activity   Alcohol use: Not Currently   Drug use: Yes    Types: Marijuana    Comment: sometimes; last use on Tuesday, 07/20/23   Sexual activity: Yes    Birth control/protection: None    Comment: same partner x 10 years  Other Topics Concern   Not on file  Social History Narrative   She works as a Arboriculturist.   Right-handed.   One cup caffeine per day.   She lives at home with daughter.   Social Determinants of Health   Financial Resource Strain: Medium Risk (08/23/2023)   Overall Financial Resource Strain (CARDIA)    Difficulty of Paying Living Expenses: Somewhat hard  Food Insecurity: No Food Insecurity (05/31/2023)   Hunger Vital Sign    Worried About Running Out of Food in the Last Year: Never true    Ran Out of Food in the Last Year: Never true  Transportation Needs: No Transportation Needs (05/31/2023)   PRAPARE - Administrator, Civil Service (Medical): No    Lack of Transportation (Non-Medical): No  Physical Activity: Inactive (08/23/2023)   Exercise Vital Sign    Days of Exercise per Week: 0 days    Minutes of Exercise per Session: 0 min  Stress: No Stress Concern Present (08/23/2023)   [employer] of Occupational Health -  Occupational Stress Questionnaire    Feeling of Stress : Not at all  Social Connections: Unknown (08/23/2023)   Social Connection and Isolation Panel [NHANES]    Frequency of Communication with Friends and Family: Patient unable to answer    Frequency of Social Gatherings with Friends and Family: More than three times a week    Attends Religious Services: More than 4 times per year    Active Member of [employer] or Organizations: Not on file    Attends Banker Meetings: More than 4 times per year    Marital Status: Not on file  Intimate Partner Violence: Not At Risk (05/31/2023)   Humiliation, Afraid, Rape, and Kick questionnaire    Fear of Current or Ex-Partner: No    Emotionally Abused: No    Physically Abused: No    Sexually Abused: No    Review of Systems  Genitourinary:  Positive for frequency and urgency.  All other systems reviewed and are negative.       Objective    BP 129/80   Pulse 71   Temp 98.1 F (36.7 C) (Oral)   Resp 16   Ht 5\' 3"  (1.6 m)   Wt 153 lb (69.4 kg)   SpO2 98%   BMI 27.10 kg/m   Physical Exam Vitals and nursing note reviewed.  Constitutional:      General: She is not in acute distress. Cardiovascular:     Rate and Rhythm: Normal rate and regular rhythm.  Pulmonary:     Effort: Pulmonary effort is normal.     Breath sounds: Normal breath sounds.  Abdominal:     Palpations: Abdomen is soft.     Tenderness: There is no abdominal tenderness.  Musculoskeletal:     Comments: Utilizing cane/stick for stability  Neurological:     General: No focal deficit present.     Mental Status: She is alert and oriented to person, place, and time.         Assessment &  Plan:   1. Urinary urgency ? Symptoms related to greatly increased A1c. Will refer to urology for further eval/mgt - POCT URINALYSIS DIP (CLINITEK) - Ambulatory referral to Urology  2. Urinary frequency As above - Ambulatory referral to Urology  3. Type 2 diabetes  mellitus with other specified complication, with long-term current use of insulin (HCC) Greatly increased A1c. Refer to [facility] for med management.  - POCT glycosylated hemoglobin (Hb A1C)  4. Encounter for long-term (current) use of insulin (HCC)     No follow-ups on file.   Tommie Raymond, MD

## 2023-08-31 ENCOUNTER — Encounter: Payer: Self-pay | Admitting: Internal Medicine

## 2023-08-31 ENCOUNTER — Ambulatory Visit (INDEPENDENT_AMBULATORY_CARE_PROVIDER_SITE_OTHER): Payer: BC Managed Care – PPO | Admitting: Internal Medicine

## 2023-08-31 VITALS — BP 130/80 | HR 75 | Resp 20 | Ht 63.0 in | Wt 153.6 lb

## 2023-08-31 DIAGNOSIS — Z794 Long term (current) use of insulin: Secondary | ICD-10-CM

## 2023-08-31 DIAGNOSIS — E785 Hyperlipidemia, unspecified: Secondary | ICD-10-CM | POA: Diagnosis not present

## 2023-08-31 DIAGNOSIS — E1169 Type 2 diabetes mellitus with other specified complication: Secondary | ICD-10-CM | POA: Diagnosis not present

## 2023-08-31 DIAGNOSIS — E11319 Type 2 diabetes mellitus with unspecified diabetic retinopathy without macular edema: Secondary | ICD-10-CM

## 2023-08-31 MED ORDER — FREESTYLE LIBRE 3 SENSOR MISC: 1.0000 | 3 refills | Status: DC

## 2023-08-31 MED ORDER — FREESTYLE LIBRE 3 PLUS SENSOR MISC: 1.0000 | 3 refills | Status: DC

## 2023-08-31 NOTE — Patient Instructions (Addendum)
Please continue: - Metformin 1000 mg with dinner - Farxiga 10 mg before b'fast - Tresiba U200 32 units daily  Increase: - FiAsp: 10-12 units before b'fast 10-12 units before lunch 13-16 units before dinner  Please return in 1 month.

## 2023-08-31 NOTE — Progress Notes (Signed)
Patient ID: [name], female   DOB: [birth year], 62 y.o.   MRN: [id number]  HPI: [name] is a 62 y.o.-year-old female, returning for f/u for DM2, dx ~2000, insulin-dependent since 2010, uncontrolled, with complications (PN, + DR). Last visit 6 months ago. She is not seeing Dr. Darrick Huntsman anymore as she does not want to drive to [facility]  Interim history: No increased urination, + blurry vision. She also has joint pains in the right side of her body and left knee.  She has more pain in the left knee after she fell on Saturday. She was admitted with intractable and/V/AP between 7/1 - 5/24.  At that time, she was taken off Ozempic and recommended to stop marijuana.  Lipase was not elevated.  She continues to have nausea.  She also continues to smoke marijuana.  Reviewed HbA1c levels: Lab Results  Component Value Date   HGBA1C 14.4 (A) 08/23/2023   HGBA1C 8.6 (H) 06/01/2023   HGBA1C 6.2 (H) 02/15/2023   HGBA1C 6.2 (A) 10/26/2022   HGBA1C 7.8 (H) 07/04/2022   HGBA1C 6.5 (A) 04/10/2022   HGBA1C 7.7 (A) 11/10/2021   HGBA1C 7.3 (A) 01/24/2021   HGBA1C 7.1 (A) 09/13/2020   HGBA1C 6.4 (A) 05/13/2020   HGBA1C 7.6 (A) 08/31/2019   HGBA1C 7.8 (A) 11/25/2018   HGBA1C 6.8 (A) 07/25/2018   HGBA1C 6.4% 03/03/2018   HGBA1C 6.5 11/01/2017   HGBA1C 6.2 07/23/2017   HGBA1C 6.8 04/09/2017   HGBA1C 6.4 01/08/2017   HGBA1C 6.4 10/08/2016   HGBA1C 10.4 07/07/2016   HGBA1C 10.4 04/07/2016   HGBA1C 9.3 01/14/2016   HGBA1C 10.9 (H) 09/25/2015   HGBA1C 11.2 09/19/2015   HGBA1C 7.7 06/21/2015   HGBA1C 7.9 (H) 03/22/2015   HGBA1C 9.4 (H) 01/04/2015   HGBA1C 8.8 (H) 10/04/2014   HGBA1C 10.3 (H) 07/06/2014   HGBA1C 7.0 (H) 03/26/2014   Pt was on:  Insulin Before breakfast Before lunch Before dinner  R 10 10 (6 units if active after the meal) 20-25  NPH 15 - 20  Also: - Metformin 1000 mg twice a day  Now on: - Metformin 2000 mg with dinner >> restarted 1000 mg with dinner - Farxiga 10 mg  before b'fast - >> off  - Tresiba U200 32 units daily  - FiAsp: 8-11 units before b'fast 8-11 units before lunch - missing 13-16 units before dinner - missing  Pt is checking her sugars >4x a day with her CGM:  Previously:  Previously:   Lowest sugar was 29 (?) in 11/2020 >> ... 49s; she has hypoglycemia awareness in the 63s. Highest sugar was  400 >> 200s >> 280s.  -No CKD, last BUN/creatinine:  Lab Results  Component Value Date   BUN 14 06/02/2023   CREATININE 0.56 06/02/2023   ACR was normal: Lab Results  Component Value Date   MICRALBCREAT <4 11/12/2022   MICRALBCREAT 3 01/24/2021   MICRALBCREAT 1.8 06/27/2018   MICRALBCREAT 1.6 06/23/2017   MICRALBCREAT 0.7 06/19/2016   MICRALBCREAT 0.4 09/25/2015   MICRALBCREAT 0.3 07/30/2014   MICRALBCREAT 0.5 06/19/2013   MICRALBCREAT 1.5 01/31/2013   MICRALBCREAT 2.6 11/20/2011  Not on ACE inhibitor/ARB  -+ HL; last set of lipids: Lab Results  Component Value Date   CHOL 216 (H) 02/25/2023   HDL 51 02/25/2023   LDLCALC 141 (H) 02/25/2023   LDLDIRECT 94.0 09/25/2015   TRIG 134 02/25/2023   CHOLHDL 4.2 02/25/2023  On Lipitor 40, prev. Also on Zetia 10.  -  last eye exam was in 11/2021: + DR OU, reportedly glaucoma. She sees Dr. Lovell Sheehan.    - + numbness and tingling in her feet.  She is on Neurontin.  She sees podiatry.  Last foot exam was checked  06/20/2023.  She also has a history of HTN, episodic alcohol abuse, GERD, low back pain, OA.  She went to the emergency room 06/28/2021 for nausea, vomiting, and abdominal pain.  Her lipase was high.  Ozempic was stopped. She was also in the ED post a fall down the stairs 08/2021. L forearm was in a splint >> saw ortho She had severe sciatic back pain.  She had nausea/vomiting/abdominal pain (07/04/2022) - was admitted.  Lipase level was normal.  It was considered that the symptoms were related to smoking marijuana and also possibly her diabetes.   ROS: + see HPI  I  reviewed pt's medications, allergies, PMH, social hx, family hx, and changes were documented in the history of present illness. Otherwise, unchanged from my initial visit note.  Past Medical History:  Diagnosis Date   Arthritis    Campylobacter enteritis May 2012   [facility] admission   Carpal tunnel syndrome, bilateral    Diabetes mellitus    Gastritis with bleeding due to alcohol 2010   [facility] admission   GERD (gastroesophageal reflux disease)    Hyperlipidemia    Hypertension    no meds   Menopause    Myocardial infarction [facility])    Neck pain    Wears glasses    Past Surgical History:  Procedure Laterality Date   CARPAL TUNNEL RELEASE Left 05/14/2014   Procedure: LEFT ULNAR NEUROPLASTY AT ELBOW AND ENDOSCOPIC CARPAL TUNNEL RELEASE;  Surgeon: Jodi Marble, MD;  Location: [facility];  Service: Orthopedics;  Laterality: Left;   CORONARY STENT INTERVENTION N/A 10/09/2019   Procedure: CORONARY STENT INTERVENTION;  Surgeon: Yates Decamp, MD;  Location: [facility];  Service: Cardiovascular;  Laterality: N/A;   CYST EXCISION  1995   under arms   endometrial biopsy  2004   benign   FOOT OSTEOTOMY  2010   right-spurs   LEFT HEART CATH AND CORONARY ANGIOGRAPHY N/A 10/09/2019   Procedure: LEFT HEART CATH AND CORONARY ANGIOGRAPHY;  Surgeon: Yates Decamp, MD;  Location: [facility];  Service: Cardiovascular;  Laterality: N/A;   TONSILLECTOMY     TOTAL KNEE ARTHROPLASTY Left 02/26/2023   Procedure: LEFT TOTAL KNEE ARTHROPLASTY;  Surgeon: Kathryne Hitch, MD;  Location: [facility];  Service: Orthopedics;  Laterality: Left;  Needs RNFA   ULNAR NERVE TRANSPOSITION Left 05/14/2014   Procedure: ULNAR NERVE DECOMPRESSION/TRANSPOSITION;  Surgeon: Jodi Marble, MD;  Location: [facility];  Service: Orthopedics;  Laterality: Left;   Social History   Socioeconomic History   Marital status: Single    Spouse name: Not on file   Number of children: 2    Years of education: 12   Highest education level: High school graduate  Occupational History   Not on file  Tobacco Use   Smoking status: Never   Smokeless tobacco: Never  Vaping Use   Vaping status: Never Used  Substance and Sexual Activity   Alcohol use: Not Currently   Drug use: Yes    Types: Marijuana    Comment: sometimes; last use on Tuesday, 07/20/23   Sexual activity: Yes    Birth control/protection: None    Comment: same partner x 10 years  Other Topics Concern  Not on file  Social History Narrative   She works as a Arboriculturist.   Right-handed.   One cup caffeine per day.   She lives at home with daughter.   Social Determinants of Health   Financial Resource Strain: Medium Risk (08/23/2023)   Overall Financial Resource Strain (CARDIA)    Difficulty of Paying Living Expenses: Somewhat hard  Food Insecurity: No Food Insecurity (05/31/2023)   Hunger Vital Sign    Worried About Running Out of Food in the Last Year: Never true    Ran Out of Food in the Last Year: Never true  Transportation Needs: No Transportation Needs (05/31/2023)   PRAPARE - Administrator, Civil Service (Medical): No    Lack of Transportation (Non-Medical): No  Physical Activity: Inactive (08/23/2023)   Exercise Vital Sign    Days of Exercise per Week: 0 days    Minutes of Exercise per Session: 0 min  Stress: No Stress Concern Present (08/23/2023)   [employer] of Occupational Health - Occupational Stress Questionnaire    Feeling of Stress : Not at all  Social Connections: Unknown (08/23/2023)   Social Connection and Isolation Panel [NHANES]    Frequency of Communication with Friends and Family: Patient unable to answer    Frequency of Social Gatherings with Friends and Family: More than three times a week    Attends Religious Services: More than 4 times per year    Active Member of [employer] or Organizations: Not on file    Attends Banker Meetings: More than 4 times per  year    Marital Status: Not on file  Intimate Partner Violence: Not At Risk (05/31/2023)   Humiliation, Afraid, Rape, and Kick questionnaire    Fear of Current or Ex-Partner: No    Emotionally Abused: No    Physically Abused: No    Sexually Abused: No   Current Outpatient Medications on File Prior to Visit  Medication Sig Dispense Refill   Ascorbic Acid (VITAMIN C PO) Take 1 tablet by mouth daily.     aspirin 81 MG chewable tablet Chew 1 tablet (81 mg total) by mouth 2 (two) times daily. 30 tablet 0   atorvastatin (LIPITOR) 40 MG tablet Take 1 tablet (40 mg total) by mouth daily. 90 tablet 3   Blood Glucose Monitoring Suppl (ACCU-CHEK AVIVA PLUS) w/Device KIT Use to check blood sugar 2 times a day 1 kit 0   Continuous Glucose Sensor (FREESTYLE LIBRE 3 SENSOR) MISC APPLY 1 SENSOR EVERY 14 DAYS 2 each 3   cyanocobalamin (VITAMIN B12) 1000 MCG tablet Take 1 tablet (1,000 mcg total) by mouth daily. 90 tablet 0   FARXIGA 10 MG TABS tablet Take 1 tablet (10 mg total) by mouth daily. 90 tablet 0   gabapentin (NEURONTIN) 600 MG tablet Take 1 tablet (600 mg total) by mouth 3 (three) times daily. (Patient taking differently: Take 600 mg by mouth daily.) 90 tablet 5   glucose blood (BAYER CONTOUR TEST) test strip TEST BLOOD SUGARS 3 TIMES DAILY 200 each 11   glucose blood test strip Use to check blood sugar 2 times a day 100 each 12   HYDROcodone-acetaminophen (NORCO/VICODIN) 5-325 MG tablet Take 1 tablet by mouth every 6 (six) hours as needed for moderate pain. (Patient taking differently: Take 1 tablet by mouth as needed for moderate pain.) 30 tablet 0   HYDROmorphone (DILAUDID) 2 MG tablet Take 2 mg by mouth as needed for severe pain.  hydrOXYzine (ATARAX) 25 MG tablet Take 1 tablet (25 mg total) by mouth 3 (three) times daily as needed. itching (Patient taking differently: Take 25 mg by mouth as needed for anxiety. itching) 30 tablet 0   insulin aspart (FIASP FLEXTOUCH) 100 UNIT/ML FlexTouch Pen  Inject 6-20 Units into the skin 3 (three) times daily before meals. (Patient taking differently: Inject 32 Units into the skin daily.) 30 mL 3   insulin degludec (TRESIBA FLEXTOUCH) 200 UNIT/ML FlexTouch Pen INJECT 30 TO 34 UNITS SUBCUTANEOUSLY ONCE DAILY Strength: 200 UNIT/ML (Patient taking differently: Inject 30-34 Units into the skin daily. Depending on BS) 9 mL 0   Insulin Pen Needle 32G X 4 MM MISC Use 4x a day 300 each 3   Lancet Devices (EASY TOUCH LANCING DEVICE) MISC Use to check diabetes 4 times daily  E 11.65 1 each 11   latanoprost (XALATAN) 0.005 % ophthalmic solution Place 1 drop into both eyes at bedtime.     metFORMIN (GLUCOPHAGE) 1000 MG tablet Take 1 tablet (1,000 mg total) by mouth daily with supper. 90 tablet 3   metoCLOPramide (REGLAN) 10 MG tablet Take 1 tablet (10 mg total) by mouth every 8 (eight) hours as needed for nausea. 30 tablet 0   metoprolol succinate (TOPROL-XL) 50 MG 24 hr tablet TAKE 1 TABLET BY MOUTH ONCE DAILY. TAKE  WITH  OR  IMMEDIATELY  FOLLOWING  A  MEAL 90 tablet 1   Na Sulfate-K Sulfate-Mg Sulf 17.5-3.13-1.6 GM/177ML SOLN Take by mouth.     naloxone (NARCAN) nasal spray 4 mg/0.1 mL Place 1 spray into the nose as needed (or).     ondansetron (ZOFRAN-ODT) 8 MG disintegrating tablet Take 1 tablet (8 mg total) by mouth every 8 (eight) hours as needed for nausea or vomiting. 10 tablet 0   pantoprazole (PROTONIX) 40 MG tablet Take 1 tablet (40 mg total) by mouth 2 (two) times daily. 60 tablet 0   tiZANidine (ZANAFLEX) 4 MG tablet Take 1 tablet (4 mg total) by mouth every 6 (six) hours as needed for muscle spasms. (Patient taking differently: Take 4 mg by mouth daily.) 30 tablet 0   triamcinolone cream (KENALOG) 0.1 % Apply 1 Application topically daily.     Current Facility-Administered Medications on File Prior to Visit  Medication Dose Route Frequency Provider Last Rate Last Admin   0.9 %  sodium chloride infusion  500 mL Intravenous Continuous Armbruster,  Willaim Rayas, MD       Allergies  Allergen Reactions   Penicillins Rash   Family History  Problem Relation Age of Onset   Hypertension Mother    Diabetes Mother    Heart attack Mother    Stroke Father    Diabetes Father    Heart attack Sister    Diabetes Maternal Grandmother    Hypertension Maternal Grandmother    Colon cancer Neg Hx    Esophageal cancer Neg Hx    Rectal cancer Neg Hx    Stomach cancer Neg Hx    PE: BP 130/80 (BP Location: Right Arm, Patient Position: Sitting, Cuff Size: Normal)   Pulse 75   Resp 20   Ht 5\' 3"  (1.6 m)   Wt 153 lb 9.6 oz (69.7 kg)   SpO2 99%   BMI 27.21 kg/m    Wt Readings from Last 10 Encounters:  08/31/23 153 lb 9.6 oz (69.7 kg)  08/23/23 153 lb (69.4 kg)  07/22/23 153 lb (69.4 kg)  06/22/23 153 lb 4 oz (69.5 kg)  06/11/23 150 lb 12.8 oz (68.4 kg)  06/02/23 154 lb 12.2 oz (70.2 kg)  05/25/23 158 lb (71.7 kg)  04/20/23 155 lb (70.3 kg)  02/26/23 174 lb 13.2 oz (79.3 kg)  02/25/23 166 lb (75.3 kg)   Constitutional: overweight Eyes: no exophthalmos ENT: no masses palpated in neck, no cervical lymphadenopathy Cardiovascular: RRR, No MRG Respiratory: CTA B Musculoskeletal: no deformities Skin: moist, warm, no rashes Neurological: no tremor with outstretched hands  ASSESSMENT: 1. DM2, insulin-dependent, uncontrolled, with complications - peripheral neuropathy - DR   2. Overweight  3. HL  PLAN:  1. Patient with longstanding, uncontrolled, type 2 diabetes, with previously improved control on the regimen containing metformin, SGLT2 inhibitor, basal/bolus insulin and weekly GLP-1 receptor agonist, with worsening of diabetes control lately after coming off Ozempic.  HbA1c increased drastically at last check, to 14.4%, up from 8.6%.  However, at last visit, her HbA1c was excellent, at 6.2% and sugars are mostly within the target range with only occasional higher values after dinner... She was off metformin at that time and I advised her  to restart it. -However, afterwards she was admitted 3 months ago with nausea, vomiting, and abdominal pain (normal lipase) which could have been due to either Ozempic or marijuana use.  She was recommended to stop both.  At today's visit, she tells me she stopped Ozempic but did not stop marijuana.  I again strongly advised her to stop this and she agrees to do so. CGM interpretation: -At today's visit, we reviewed her CGM downloads: It appears that 0% of values are in target range (goal >70%), while 100% are higher than 180 (goal <25%), and 0% are lower than 70 (goal <4%).  The calculated average blood sugar is 369. -Reviewing the CGM trends, sugars appear to be uniformly high, in the last contrast of the blood sugars at last visit.  Aside being off Ozempic, she has not been taking her insulin consistently and she also relaxed her diet.  We discussed that she absolutely needs to start working on her diet again especially as we are not able to use Ozempic anymore for her.  I also strongly advised her to take her insulin consistently and we increased the dose of Fiasp insulin with breakfast and lunch.  I advised her to continue metformin and Tresiba at the same doses for now. - I advised her to: Patient Instructions  Please continue: - Metformin 1000 mg with dinner - Farxiga 10 mg before b'fast - Tresiba U200 32 units daily  Increase: - FiAsp: 10-12 units before b'fast 10-12 units before lunch 13-16 units before dinner  Please return in 1 month.  - advised to check sugars at different times of the day - 4x a day, rotating check times - advised for yearly eye exams >> she is UTD - return to clinic in 1 month  2. Overweight -We tried Ozempic in the past but she had to come off due to nausea/vomiting/abdominal pain and a high lipase in 05/2021.  Afterwards, she really wanted to restart this again.  We discussed about possible side effects and possible alarm symptoms and we started her back on a  low-dose of Ozempic.  However, she was admitted in 05/2023 with intractable nausea/vomiting/abdominal pain (at that time, she also reported daily marijuana use - was advised to stop).  She is now off Ozempic. -She lost 24 pounds before the last 2 visits combined -She lost 13 more pounds since last visit  3. HL -Reviewed latest  lipid panel from 01/2023: LDL elevated, otherwise fractions at goal Lab Results  Component Value Date   CHOL 216 (H) 02/25/2023   HDL 51 02/25/2023   LDLCALC 141 (H) 02/25/2023   LDLDIRECT 94.0 09/25/2015   TRIG 134 02/25/2023   CHOLHDL 4.2 02/25/2023  -At last visit she was off Lipitor 40 mg daily and Zetia 10 mg daily, which previously she can tolerate without side effects.  After the above results returned, I advised her to restart Lipitor.  At today's visit, she does not remember if she is taking this or not.  She will look at home.  Carlus Pavlov, MD PhD [facility] Endocrinology

## 2023-09-10 ENCOUNTER — Other Ambulatory Visit (HOSPITAL_COMMUNITY): Payer: Self-pay

## 2023-09-10 ENCOUNTER — Telehealth: Payer: Self-pay

## 2023-09-10 MED ORDER — DEXCOM G7 SENSOR MISC: 1.0000 | 0 refills | Status: AC

## 2023-09-10 NOTE — Telephone Encounter (Signed)
OK to switch to [facility]?

## 2023-09-10 NOTE — Telephone Encounter (Signed)
Pt needs PA for [place] 3 plus.

## 2023-09-10 NOTE — Telephone Encounter (Signed)
Pharmacy Patient Advocate Encounter   Received notification from Pt Calls Messages that prior authorization for Freestyle libre 3 plus is required/requested.   Insurance verification completed.   The patient is insured through [facility] ADVANTAGE/RX ADVANCE .   Per test claim:  Dexcom is preferred by the insurance.  If suggested medication is appropriate, Please send in a new RX and discontinue this one. If not, please advise as to why it's not appropriate so that we may request a Prior Authorization.   Please note that Dexcom will still require a PA but trial/failure is neccessary before [name] will be approved

## 2023-09-10 NOTE — Addendum Note (Signed)
Addended by: Pollie Meyer on: 09/10/2023 04:32 PM   Modules accepted: Orders

## 2023-09-22 ENCOUNTER — Encounter: Payer: Self-pay | Admitting: Orthopaedic Surgery

## 2023-09-22 ENCOUNTER — Other Ambulatory Visit: Payer: Self-pay

## 2023-09-22 ENCOUNTER — Ambulatory Visit: Payer: BC Managed Care – PPO | Admitting: Orthopaedic Surgery

## 2023-09-22 ENCOUNTER — Other Ambulatory Visit (INDEPENDENT_AMBULATORY_CARE_PROVIDER_SITE_OTHER): Payer: Self-pay

## 2023-09-22 DIAGNOSIS — Z96652 Presence of left artificial knee joint: Secondary | ICD-10-CM

## 2023-09-22 MED ORDER — HYDROCODONE-ACETAMINOPHEN 5-325 MG PO TABS: 1.0000 | ORAL_TABLET | Freq: Four times a day (QID) | ORAL | 0 refills | Status: DC | PRN

## 2023-09-22 MED ORDER — DICLOFENAC SODIUM 1 % EX GEL: 4.0000 g | Freq: Four times a day (QID) | CUTANEOUS | 1 refills | Status: DC

## 2023-09-22 NOTE — Progress Notes (Signed)
HPI: [name] returns today follow-up left total knee arthroplasty 02/26/2023.  She states she has had some falls which she is quite unsure of why she falls.  She does note that the leg is very stiff whenever she first begins to ambulate.  She still having pain in the knee.  She has been in therapy.  She feels that her gait and balance are off.  She is unsure if she has any dizziness when she first stands though.  Most the pain is medial aspect of the left knee.  She is diabetic and reports that her glucose levels have been up since being taken off Ozempic.  Review of systems: See HPI otherwise negative  Physical exam: General Well-developed well-nourished female who ambulates with a rollator. Psych: Alert and oriented x 3 Respirations: Unlabored Left knee full extension flexion to 90 degrees.  No instability valgus varus stressing.  Anterior drawer is negative.  Surgical incisions well-healed.  Left calf supple nontender.  Maximal tenderness over the pes anserinus area.  Tight hamstrings bilaterally.  Radiographs: Left knee 2 views: Status post left total knee arthroplasty well-seated components.  No acute fractures or acute findings.  Knee is well located.  Impression: Status post left total knee arthroplasty 02/26/2023 Gait/balance disturbance  Plan: Will send her to formal physical therapy for range of motion modalities and aquatic therapy.  They will also work with her on hamstring stretching gait and balance.  Per her request that she has not been able to return to work full I have her undergo an Administrator, arts.  She will follow-up with Dr. Magnus Gamble after the [facility] to go over results and discuss further treatment.  She will also discuss with her primary care physician her frequent falls.  She is given refill on her hydrocodone.  Also she will use Voltaren gel 4 g 4 times daily to the left knee particularly over the pes anserinus region.

## 2023-09-27 ENCOUNTER — Ambulatory Visit: Payer: Self-pay | Admitting: Cardiology

## 2023-10-01 ENCOUNTER — Encounter: Payer: Self-pay | Admitting: Internal Medicine

## 2023-10-01 ENCOUNTER — Ambulatory Visit (INDEPENDENT_AMBULATORY_CARE_PROVIDER_SITE_OTHER): Payer: BC Managed Care – PPO | Admitting: Internal Medicine

## 2023-10-01 VITALS — BP 110/64 | HR 85 | Ht 63.0 in | Wt 162.6 lb

## 2023-10-01 DIAGNOSIS — E663 Overweight: Secondary | ICD-10-CM | POA: Diagnosis not present

## 2023-10-01 DIAGNOSIS — E785 Hyperlipidemia, unspecified: Secondary | ICD-10-CM

## 2023-10-01 DIAGNOSIS — E11319 Type 2 diabetes mellitus with unspecified diabetic retinopathy without macular edema: Secondary | ICD-10-CM

## 2023-10-01 DIAGNOSIS — E1169 Type 2 diabetes mellitus with other specified complication: Secondary | ICD-10-CM | POA: Diagnosis not present

## 2023-10-01 DIAGNOSIS — Z794 Long term (current) use of insulin: Secondary | ICD-10-CM

## 2023-10-01 NOTE — Progress Notes (Signed)
Patient ID: [name], female   DOB: 1961-11-10, 62 y.o.   MRN: [id number]  HPI: [name] is a 62 y.o.-year-old female, returning for f/u for DM2, dx ~2000, insulin-dependent since 2010, uncontrolled, with complications (PN, + DR). Last visit 1.5 months ago.  Interim history: No increased urination, + blurry vision.  She also has joint pains in the right side of her body and left knee after she fell 08/2023. Also, upper and lower back pain. She started to go to the gym recently. Sugars are o normal.  Ccasionally too low after these sessions.  Reviewed HbA1c levels: Lab Results  Component Value Date   HGBA1C 14.4 (A) 08/23/2023   HGBA1C 8.6 (H) 06/01/2023   HGBA1C 6.2 (H) 02/15/2023   HGBA1C 6.2 (A) 10/26/2022   HGBA1C 7.8 (H) 07/04/2022   HGBA1C 6.5 (A) 04/10/2022   HGBA1C 7.7 (A) 11/10/2021   HGBA1C 7.3 (A) 01/24/2021   HGBA1C 7.1 (A) 09/13/2020   HGBA1C 6.4 (A) 05/13/2020   HGBA1C 7.6 (A) 08/31/2019   HGBA1C 7.8 (A) 11/25/2018   HGBA1C 6.8 (A) 07/25/2018   HGBA1C 6.4% 03/03/2018   HGBA1C 6.5 11/01/2017   HGBA1C 6.2 07/23/2017   HGBA1C 6.8 04/09/2017   HGBA1C 6.4 01/08/2017   HGBA1C 6.4 10/08/2016   HGBA1C 10.4 07/07/2016   HGBA1C 10.4 04/07/2016   HGBA1C 9.3 01/14/2016   HGBA1C 10.9 (H) 09/25/2015   HGBA1C 11.2 09/19/2015   HGBA1C 7.7 06/21/2015   HGBA1C 7.9 (H) 03/22/2015   HGBA1C 9.4 (H) 01/04/2015   HGBA1C 8.8 (H) 10/04/2014   HGBA1C 10.3 (H) 07/06/2014   HGBA1C 7.0 (H) 03/26/2014   Pt was on:  Insulin Before breakfast Before lunch Before dinner  R 10 10 (6 units if active after the meal) 20-25  NPH 15 - 20  Also: - Metformin 1000 mg twice a day  At last visit she was on: - Metformin 2000 mg with dinner >> restarted 1000 mg with dinner - Farxiga 10 mg before b'fast - >> off  - Tresiba U200 32 units daily  - FiAsp: 8-11 units before b'fast 8-11 units before lunch - missing 13-16 units before dinner - missing  I have recommended the following  regimen: - Metformin 1000 mg with dinner - Farxiga 10 mg before b'fast - Tresiba U200 32 units daily - FiAsp: 10-12 units before b'fast 10-12 units before lunch 13-16 units before dinner  Pt is checking her sugars >4x a day with her CGM:   Prev.:   Previously:   Lowest sugar was 29 (?) in 11/2020 >> ... 50s>> 40s; she has hypoglycemia awareness in the 31s. Highest sugar was  400 >> 200s >> 280s >> 400s  -No CKD, last BUN/creatinine:  Lab Results  Component Value Date   BUN 14 06/02/2023   CREATININE 0.56 06/02/2023   ACR was normal: Lab Results  Component Value Date   MICRALBCREAT <4 11/12/2022   MICRALBCREAT 3 01/24/2021   MICRALBCREAT 1.8 06/27/2018   MICRALBCREAT 1.6 06/23/2017   MICRALBCREAT 0.7 06/19/2016   MICRALBCREAT 0.4 09/25/2015   MICRALBCREAT 0.3 07/30/2014   MICRALBCREAT 0.5 06/19/2013   MICRALBCREAT 1.5 01/31/2013   MICRALBCREAT 2.6 11/20/2011  Not on ACE inhibitor/ARB  -+ HL; last set of lipids: Lab Results  Component Value Date   CHOL 216 (H) 02/25/2023   HDL 51 02/25/2023   LDLCALC 141 (H) 02/25/2023   LDLDIRECT 94.0 09/25/2015   TRIG 134 02/25/2023   CHOLHDL 4.2 02/25/2023  On Lipitor 40, prev.  Also on Zetia 10.  - last eye exam was in 11/2022 reportedly: + DR OU, reportedly glaucoma. She sees Dr. Lovell Sheehan.    - + numbness and tingling in her feet.  She is on Neurontin.  She sees podiatry.  Last foot exam was checked 08/24/2023.  She also has a history of HTN, episodic alcohol abuse, GERD, low back pain, OA.  She went to the emergency room 06/28/2021 for nausea, vomiting, and abdominal pain.  Her lipase was high.  Ozempic was stopped. She was also in the ED post a fall down the stairs 08/2021. L forearm was in a splint >> saw ortho She had severe sciatic back pain.  She had nausea/vomiting/abdominal pain (06/2022) - was admitted.  Lipase level was normal.  It was considered that the symptoms were related to smoking marijuana and also possibly  her diabetes.   She had an almost identical admission in 05/2023.  Ozempic was stopped, however, she was again smoking marijuana at that time.  ROS: + see HPI  I reviewed pt's medications, allergies, PMH, social hx, family hx, and changes were documented in the history of present illness. Otherwise, unchanged from my initial visit note.  Past Medical History:  Diagnosis Date   Arthritis    Campylobacter enteritis May 2012   [facility] admission   Carpal tunnel syndrome, bilateral    Diabetes mellitus    Gastritis with bleeding due to alcohol 2010   [facility] admission   GERD (gastroesophageal reflux disease)    Hyperlipidemia    Hypertension    no meds   Menopause    Myocardial infarction [facility])    Neck pain    Wears glasses    Past Surgical History:  Procedure Laterality Date   CARPAL TUNNEL RELEASE Left 05/14/2014   Procedure: LEFT ULNAR NEUROPLASTY AT ELBOW AND ENDOSCOPIC CARPAL TUNNEL RELEASE;  Surgeon: Jodi Marble, MD;  Location: [facility];  Service: Orthopedics;  Laterality: Left;   CORONARY STENT INTERVENTION N/A 10/09/2019   Procedure: CORONARY STENT INTERVENTION;  Surgeon: Yates Decamp, MD;  Location: [facility];  Service: Cardiovascular;  Laterality: N/A;   CYST EXCISION  1995   under arms   endometrial biopsy  2004   benign   FOOT OSTEOTOMY  2010   right-spurs   LEFT HEART CATH AND CORONARY ANGIOGRAPHY N/A 10/09/2019   Procedure: LEFT HEART CATH AND CORONARY ANGIOGRAPHY;  Surgeon: Yates Decamp, MD;  Location: [facility];  Service: Cardiovascular;  Laterality: N/A;   TONSILLECTOMY     TOTAL KNEE ARTHROPLASTY Left 02/26/2023   Procedure: LEFT TOTAL KNEE ARTHROPLASTY;  Surgeon: Kathryne Hitch, MD;  Location: [facility];  Service: Orthopedics;  Laterality: Left;  Needs RNFA   ULNAR NERVE TRANSPOSITION Left 05/14/2014   Procedure: ULNAR NERVE DECOMPRESSION/TRANSPOSITION;  Surgeon: Jodi Marble, MD;  Location: [facility];   Service: Orthopedics;  Laterality: Left;   Social History   Socioeconomic History   Marital status: Single    Spouse name: Not on file   Number of children: 2   Years of education: 12   Highest education level: High school graduate  Occupational History   Not on file  Tobacco Use   Smoking status: Never   Smokeless tobacco: Never  Vaping Use   Vaping status: Never Used  Substance and Sexual Activity   Alcohol use: Not Currently   Drug use: Yes    Types: Marijuana    Comment: sometimes; last use on  Tuesday, 07/20/23   Sexual activity: Yes    Birth control/protection: None    Comment: same partner x 10 years  Other Topics Concern   Not on file  Social History Narrative   She works as a Arboriculturist.   Right-handed.   One cup caffeine per day.   She lives at home with daughter.   Social Determinants of Health   Financial Resource Strain: Medium Risk (08/23/2023)   Overall Financial Resource Strain (CARDIA)    Difficulty of Paying Living Expenses: Somewhat hard  Food Insecurity: No Food Insecurity (05/31/2023)   Hunger Vital Sign    Worried About Running Out of Food in the Last Year: Never true    Ran Out of Food in the Last Year: Never true  Transportation Needs: No Transportation Needs (05/31/2023)   PRAPARE - Administrator, Civil Service (Medical): No    Lack of Transportation (Non-Medical): No  Physical Activity: Inactive (08/23/2023)   Exercise Vital Sign    Days of Exercise per Week: 0 days    Minutes of Exercise per Session: 0 min  Stress: No Stress Concern Present (08/23/2023)   [employer] of Occupational Health - Occupational Stress Questionnaire    Feeling of Stress : Not at all  Social Connections: Unknown (08/23/2023)   Social Connection and Isolation Panel [NHANES]    Frequency of Communication with Friends and Family: Patient unable to answer    Frequency of Social Gatherings with Friends and Family: More than three times a week    Attends  Religious Services: More than 4 times per year    Active Member of [employer] or Organizations: Not on file    Attends Banker Meetings: More than 4 times per year    Marital Status: Not on file  Intimate Partner Violence: Not At Risk (05/31/2023)   Humiliation, Afraid, Rape, and Kick questionnaire    Fear of Current or Ex-Partner: No    Emotionally Abused: No    Physically Abused: No    Sexually Abused: No   Current Outpatient Medications on File Prior to Visit  Medication Sig Dispense Refill   Ascorbic Acid (VITAMIN C PO) Take 1 tablet by mouth daily.     aspirin 81 MG chewable tablet Chew 1 tablet (81 mg total) by mouth 2 (two) times daily. 30 tablet 0   atorvastatin (LIPITOR) 40 MG tablet Take 1 tablet (40 mg total) by mouth daily. 90 tablet 3   Blood Glucose Monitoring Suppl (ACCU-CHEK AVIVA PLUS) w/Device KIT Use to check blood sugar 2 times a day 1 kit 0   Continuous Glucose Sensor (DEXCOM G7 SENSOR) MISC 1 Device by Does not apply route continuous. 9 each 0   cyanocobalamin (VITAMIN B12) 1000 MCG tablet Take 1 tablet (1,000 mcg total) by mouth daily. 90 tablet 0   diclofenac Sodium (VOLTAREN) 1 % GEL Apply 4 g topically 4 (four) times daily. 350 g 1   FARXIGA 10 MG TABS tablet Take 1 tablet (10 mg total) by mouth daily. 90 tablet 0   gabapentin (NEURONTIN) 600 MG tablet Take 1 tablet (600 mg total) by mouth 3 (three) times daily. (Patient taking differently: Take 600 mg by mouth daily.) 90 tablet 5   glucose blood (BAYER CONTOUR TEST) test strip TEST BLOOD SUGARS 3 TIMES DAILY 200 each 11   glucose blood test strip Use to check blood sugar 2 times a day 100 each 12   HYDROcodone-acetaminophen (NORCO/VICODIN) 5-325 MG tablet Take 1 tablet  by mouth every 6 (six) hours as needed for moderate pain (pain score 4-6). 30 tablet 0   HYDROmorphone (DILAUDID) 2 MG tablet Take 2 mg by mouth as needed for severe pain.     hydrOXYzine (ATARAX) 25 MG tablet Take 1 tablet (25 mg total) by  mouth 3 (three) times daily as needed. itching (Patient taking differently: Take 25 mg by mouth as needed for anxiety. itching) 30 tablet 0   insulin aspart (FIASP FLEXTOUCH) 100 UNIT/ML FlexTouch Pen Inject 6-20 Units into the skin 3 (three) times daily before meals. (Patient taking differently: Inject 32 Units into the skin daily.) 30 mL 3   insulin degludec (TRESIBA FLEXTOUCH) 200 UNIT/ML FlexTouch Pen INJECT 30 TO 34 UNITS SUBCUTANEOUSLY ONCE DAILY Strength: 200 UNIT/ML (Patient taking differently: Inject 30-34 Units into the skin daily. Depending on BS) 9 mL 0   Insulin Pen Needle 32G X 4 MM MISC Use 4x a day 300 each 3   Lancet Devices (EASY TOUCH LANCING DEVICE) MISC Use to check diabetes 4 times daily  E 11.65 1 each 11   latanoprost (XALATAN) 0.005 % ophthalmic solution Place 1 drop into both eyes at bedtime.     metFORMIN (GLUCOPHAGE) 1000 MG tablet Take 1 tablet (1,000 mg total) by mouth daily with supper. 90 tablet 3   metoCLOPramide (REGLAN) 10 MG tablet Take 1 tablet (10 mg total) by mouth every 8 (eight) hours as needed for nausea. 30 tablet 0   metoprolol succinate (TOPROL-XL) 50 MG 24 hr tablet TAKE 1 TABLET BY MOUTH ONCE DAILY. TAKE  WITH  OR  IMMEDIATELY  FOLLOWING  A  MEAL 90 tablet 1   Na Sulfate-K Sulfate-Mg Sulf 17.5-3.13-1.6 GM/177ML SOLN Take by mouth.     naloxone (NARCAN) nasal spray 4 mg/0.1 mL Place 1 spray into the nose as needed (or).     ondansetron (ZOFRAN-ODT) 8 MG disintegrating tablet Take 1 tablet (8 mg total) by mouth every 8 (eight) hours as needed for nausea or vomiting. 10 tablet 0   pantoprazole (PROTONIX) 40 MG tablet Take 1 tablet (40 mg total) by mouth 2 (two) times daily. 60 tablet 0   tiZANidine (ZANAFLEX) 4 MG tablet Take 1 tablet (4 mg total) by mouth every 6 (six) hours as needed for muscle spasms. (Patient taking differently: Take 4 mg by mouth daily.) 30 tablet 0   triamcinolone cream (KENALOG) 0.1 % Apply 1 Application topically daily.     Current  Facility-Administered Medications on File Prior to Visit  Medication Dose Route Frequency Provider Last Rate Last Admin   0.9 %  sodium chloride infusion  500 mL Intravenous Continuous Armbruster, Willaim Rayas, MD       Allergies  Allergen Reactions   Penicillins Rash   Family History  Problem Relation Age of Onset   Hypertension Mother    Diabetes Mother    Heart attack Mother    Stroke Father    Diabetes Father    Heart attack Sister    Diabetes Maternal Grandmother    Hypertension Maternal Grandmother    Colon cancer Neg Hx    Esophageal cancer Neg Hx    Rectal cancer Neg Hx    Stomach cancer Neg Hx    PE: BP 110/64   Pulse 85   Ht 5\' 3"  (1.6 m)   Wt 162 lb 9.6 oz (73.8 kg)   SpO2 99%   BMI 28.80 kg/m    Wt Readings from Last 10 Encounters:  10/01/23 162 lb  9.6 oz (73.8 kg)  08/31/23 153 lb 9.6 oz (69.7 kg)  08/23/23 153 lb (69.4 kg)  07/22/23 153 lb (69.4 kg)  06/22/23 153 lb 4 oz (69.5 kg)  06/11/23 150 lb 12.8 oz (68.4 kg)  06/02/23 154 lb 12.2 oz (70.2 kg)  05/25/23 158 lb (71.7 kg)  04/20/23 155 lb (70.3 kg)  02/26/23 174 lb 13.2 oz (79.3 kg)   Constitutional: overweight Eyes: no exophthalmos ENT: no masses palpated in neck, no cervical lymphadenopathy Cardiovascular: RRR, No MRG Respiratory: CTA B Musculoskeletal: no deformities Skin: moist, warm, no rashes Neurological: no tremor with outstretched hands  ASSESSMENT: 1. DM2, insulin-dependent, uncontrolled, with complications - peripheral neuropathy - DR   2. Overweight  3. HL  PLAN:  1. Patient with longstanding, uncontrolled, type 2 diabetes, with improvement in control in the past on metformin, SGLT2 inhibitor, basal bolus insulin and weekly GLP-1 receptor agonist, but off Ozempic due to nausea, vomiting, abdominal pain (normal lipase).  During that admission with the above symptoms 3 months prior to our last visit, she also admitted for marijuana use.  We discussed that she absolutely needed to  stop this so we could possibly restart Ozempic in the future.  Her diabetes control was drastically improved while on insulin infusion.  At last visit, HbA1c was much higher, at 14.4%.  We increased her Fiasp dose, but we discussed that the most important thing was to take her insulin consistently, which she has not been doing and improve her diet, which she had relaxed. CGM interpretation: -At today's visit, we reviewed her CGM downloads: It appears that 68% of values are in target range (goal >70%), while 25% are higher than 180 (goal <25%), and 7% are lower than 70 (goal <4%).  The calculated average blood sugar is 151.  The projected HbA1c for the next 3 months (GMI) is 6.9%. -Reviewing the CGM trends, sugars are drastically improved from last visit, now mostly fluctuating within the range but with quite a few lows especially overnight but also throughout the day.  She also had occasional high blood sugars, after certain meals.  She started to take Fiasp consistently and correctly, before meals, since last visit, and also started to go to the gym.  She mentions that her sugars are lower after she goes to the gym and also occasionally after taking he has been delaying the meal.  We discussed about taking Fiasp at the beginning of the meal only.  However, now that she is taking the insulin consistently, it appears that she does not need as much insulin so I advised her to reduce both [place] and Fiasp doses.  Will continue metformin and Farxiga for now.  I congratulated her about the much improved blood sugar control. -She is interested in a referral to nutrition >> placed today - I advised her to: Patient Instructions  Please continue: - Metformin 1000 mg with dinner - Farxiga 10 mg before b'fast  Decrease: - Tresiba U200 28 units daily - FiAsp: 6-8 units before b'fast 6-8 units before lunch 10-12 units before dinner  Please schedule an appt with Oran Rein with nutrition -  [phone].  Please return in 2 months.  - advised to check sugars at different times of the day - 4x a day, rotating check times - advised for yearly eye exams >> she is UTD - return to clinic in 2 months  2. Overweight -We tried Ozempic in the past but she had to come off due to nausea/vomiting/abdominal pain  and a high lipase in 05/2021.  Afterwards, she really wanted to restart this again.  We discussed about possible side effects and possible alarm symptoms and we started her back on a low-dose of Ozempic.  However, she was admitted in 05/2023 with intractable nausea/vomiting/abdominal pain (at that time, she also reported daily marijuana use - was advised to stop).  She came off Ozempic at that time. -She lost 37 pounds before the last 3 visits combined, possibly due to glucotoxicity -She gained 9 pounds since last visit  3. HL -Latest lipid panel from 01/2023 showed an LDL above target, otherwise fractions at goal: Lab Results  Component Value Date   CHOL 216 (H) 02/25/2023   HDL 51 02/25/2023   LDLCALC 141 (H) 02/25/2023   LDLDIRECT 94.0 09/25/2015   TRIG 134 02/25/2023   CHOLHDL 4.2 02/25/2023  -She was previously on Lipitor 40 mg daily and Zetia 10 mg daily without side effects.  After the above results returned, I advised her to restart the Lipitor.    Carlus Pavlov, MD PhD [facility]

## 2023-10-01 NOTE — Patient Instructions (Addendum)
Please continue: - Metformin 1000 mg with dinner - Farxiga 10 mg before b'fast  Decrease: - Tresiba U200 28 units daily - FiAsp: 6-8 units before b'fast 6-8 units before lunch 10-12 units before dinner  Please schedule an appt with [name] with nutrition - [phone].  Please return in 2 months.

## 2023-10-02 LAB — MICROALBUMIN / CREATININE URINE RATIO
Creatinine, Urine: 203 mg/dL (ref 20–275)
Microalb, Ur: <0.2 mg/dL

## 2023-10-05 ENCOUNTER — Other Ambulatory Visit: Payer: Self-pay

## 2023-10-05 ENCOUNTER — Ambulatory Visit: Payer: BC Managed Care – PPO | Attending: Physician Assistant | Admitting: Physical Therapy

## 2023-10-05 ENCOUNTER — Encounter: Payer: Self-pay | Admitting: Physical Therapy

## 2023-10-05 ENCOUNTER — Other Ambulatory Visit (HOSPITAL_COMMUNITY): Payer: Self-pay

## 2023-10-05 ENCOUNTER — Telehealth: Payer: Self-pay

## 2023-10-05 DIAGNOSIS — R600 Localized edema: Secondary | ICD-10-CM | POA: Insufficient documentation

## 2023-10-05 DIAGNOSIS — R2689 Other abnormalities of gait and mobility: Secondary | ICD-10-CM | POA: Diagnosis present

## 2023-10-05 DIAGNOSIS — R2681 Unsteadiness on feet: Secondary | ICD-10-CM

## 2023-10-05 DIAGNOSIS — M25662 Stiffness of left knee, not elsewhere classified: Secondary | ICD-10-CM

## 2023-10-05 DIAGNOSIS — M25562 Pain in left knee: Secondary | ICD-10-CM | POA: Diagnosis present

## 2023-10-05 DIAGNOSIS — M6281 Muscle weakness (generalized): Secondary | ICD-10-CM | POA: Diagnosis present

## 2023-10-05 DIAGNOSIS — Z96652 Presence of left artificial knee joint: Secondary | ICD-10-CM | POA: Insufficient documentation

## 2023-10-05 NOTE — Telephone Encounter (Signed)
Pharmacy Patient Advocate Encounter   Received notification from CoverMyMeds that prior authorization for Dexcom G7 sensor is required/requested.   Insurance verification completed.   The patient is insured through [facility] .   Per test claim: PA required; PA submitted to above mentioned insurance via CoverMyMeds Key/confirmation #/EOC [id number] Status is pending

## 2023-10-05 NOTE — Therapy (Signed)
OUTPATIENT PHYSICAL THERAPY LOWER EXTREMITY EVALUATION   Patient Name: [name] MRN: [id number] DOB:04-Sep-1961, 62 y.o., female Today's Date: 10/05/2023  END OF SESSION:  PT End of Session - 10/05/23 1252     Visit Number 1    Authorization Type [employer] State $72 copay    PT Start Time 1255    PT Stop Time 1335    PT Time Calculation (min) 40 min    Activity Tolerance Patient tolerated treatment well;Patient limited by fatigue    Behavior During Therapy [facility] for tasks assessed/performed             Past Medical History:  Diagnosis Date   Arthritis    Campylobacter enteritis May 2012   [facility] admission   Carpal tunnel syndrome, bilateral    Diabetes mellitus    Gastritis with bleeding due to alcohol 2010   [facility] admission   GERD (gastroesophageal reflux disease)    Hyperlipidemia    Hypertension    no meds   Menopause    Myocardial infarction (HCC)    Neck pain    Wears glasses    Past Surgical History:  Procedure Laterality Date   CARPAL TUNNEL RELEASE Left 05/14/2014   Procedure: LEFT ULNAR NEUROPLASTY AT ELBOW AND ENDOSCOPIC CARPAL TUNNEL RELEASE;  Surgeon: Jodi Marble, MD;  Location: [facility];  Service: Orthopedics;  Laterality: Left;   CORONARY STENT INTERVENTION N/A 10/09/2019   Procedure: CORONARY STENT INTERVENTION;  Surgeon: Yates Decamp, MD;  Location: [facility];  Service: Cardiovascular;  Laterality: N/A;   CYST EXCISION  1995   under arms   endometrial biopsy  2004   benign   FOOT OSTEOTOMY  2010   right-spurs   LEFT HEART CATH AND CORONARY ANGIOGRAPHY N/A 10/09/2019   Procedure: LEFT HEART CATH AND CORONARY ANGIOGRAPHY;  Surgeon: Yates Decamp, MD;  Location: [facility];  Service: Cardiovascular;  Laterality: N/A;   TONSILLECTOMY     TOTAL KNEE ARTHROPLASTY Left 02/26/2023   Procedure: LEFT TOTAL KNEE ARTHROPLASTY;  Surgeon: Kathryne Hitch, MD;  Location: [facility];  Service: Orthopedics;  Laterality: Left;   Needs RNFA   ULNAR NERVE TRANSPOSITION Left 05/14/2014   Procedure: ULNAR NERVE DECOMPRESSION/TRANSPOSITION;  Surgeon: Jodi Marble, MD;  Location: [facility];  Service: Orthopedics;  Laterality: Left;   Patient Active Problem List   Diagnosis Date Noted   Mixed hyperlipidemia due to type 2 diabetes mellitus (HCC) 06/02/2023   Marijuana use, continuous 06/02/2023   Diabetic peripheral neuropathy associated with type 2 diabetes mellitus (HCC) 06/02/2023   Intractable vomiting with nausea 06/02/2023   Abdominal pain 06/01/2023   SIRS (systemic inflammatory response syndrome) (HCC) 06/01/2023   High anion gap metabolic acidosis 06/01/2023   Dehydration 06/01/2023   Acute prerenal azotemia 06/01/2023   Arthrofibrosis of total knee arthroplasty, subsequent encounter 04/07/2023   Status post left knee replacement 02/26/2023   Unilateral primary osteoarthritis, left knee 09/09/2022   Intractable nausea and vomiting 07/04/2022   Hematemesis 07/04/2022   Hypokalemia 07/04/2022   Lactic acidosis 07/04/2022   Pain due to onychomycosis of toenails of both feet 04/28/2022   Diabetic neuropathy, type II diabetes mellitus (HCC) 04/28/2022   De Quervain's tenosynovitis, left 11/13/2021   Bilateral injections given May 21, 2020 degenerative arthritis of knee, bilateral 05/21/2020   Traumatic hematoma of female breast 11/29/2019   Dizziness 11/29/2019   Hospital discharge follow-up 10/23/2019   NSTEMI (non-ST elevated myocardial infarction) (HCC) 10/10/2019  CAD (coronary artery disease) 10/10/2019   Chest pain 10/09/2019   Elevated troponin 10/09/2019   Uncontrolled type 2 diabetes mellitus with hyperglycemia (HCC) 10/09/2019   Obesity (BMI 30.0-34.9) 10/09/2019   Cervical spondylitis with radiculitis (HCC) 08/29/2019   Acute bursitis of left shoulder 01/30/2019   Chronic midline posterior neck pain 06/28/2018   Urgency incontinence 06/28/2018   Degenerative joint disease of  knee, left 04/26/2018   Carpal tunnel syndrome on both sides 03/29/2018   Anemia 01/02/2018   Class 1 obesity with serious comorbidity and body mass index (BMI) of 32.0 to 32.9 in adult 07/23/2017   Left hip pain 05/04/2017   Carpal tunnel syndrome on right 12/27/2016   Polyarthralgia 11/09/2016   Uncontrolled type 2 diabetes mellitus with hyperglycemia, with long-term current use of insulin (HCC) 07/07/2016   Visit for preventive health examination 06/21/2016   Increased urinary frequency 06/21/2016   Chronic meniscal tear of knee 08/19/2015   Left knee pain 06/19/2015   Candidiasis of skin 06/18/2015   Genital herpes 07/31/2014   Ulnar nerve compression 09/21/2013   Noncompliance with diet and medication regimen 11/15/2012   Boil, axilla 08/14/2012   Arthritis    Screening for breast cancer 08/04/2011   Screening for colon cancer 08/04/2011   HIDRADENITIS SUPPURATIVA 04/11/2007   GERD without esophagitis 11/17/2006   ENDOMETRIAL POLYP 11/17/2006   LOW BACK PAIN 11/17/2006   FIBROIDS, UTERUS 11/16/2006   Hyperlipidemia 11/16/2006   Essential hypertension 11/16/2006    PCP: Georganna Skeans, MD  REFERRING PROVIDER: Kirtland Bouchard, PA-C  REFERRING DIAG: [phone] (ICD-10-CM) - History of left knee replacement  THERAPY DIAG:  No diagnosis found.  Rationale for Evaluation and Treatment: Rehabilitation  ONSET DATE: falls since April 2024, back pain chronic, knee pain since March 2024  SUBJECTIVE:   SUBJECTIVE STATEMENT: Pt states she's losing her balance. Pt reports her whole body is hurting her -- back to ankles. Pt states she has been falling. Pt is interested in aquatics. Pt states both knees bother her. Pt does have a gym membership and goes into the water. [facility].   PERTINENT HISTORY: 02/26/23 L TKA (still unable to get full bend)  PAIN:  Are you having pain? Yes: NPRS scale: 8/10 Pain location: bilat knees Pain description: anterior and  mediolateral Aggravating factors: not sure Relieving factors: not sure  PRECAUTIONS: Fall  RED FLAGS: None   WEIGHT BEARING RESTRICTIONS: No  FALLS:  Has patient fallen in last 6 months? Yes. 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  DIAGNOSTIC FINDINGS: Radiographs: Left knee 2 views: Status post left total knee arthroplasty well-seated components. No acute fractures or acute findings. Knee is well located.   PATIENT SURVEYS:  Lower Extremity Functional Score: 12 / 80 = 15.0 %  COGNITION: Overall cognitive status: Within functional limits for tasks assessed     SENSATION: WFL  EDEMA:  L knee swollen  MUSCLE LENGTH: Hamstrings: Limited due to knee ROM Thomas test: did not assess  POSTURE: flexed trunk  and weight shift right  PALPATION: Trigger points most notable in L VMO, rectus femoris, vastus lateralis, adductors TTP pes anserine and patellar tendon  LOWER EXTREMITY ROM:  Active ROM Right eval Left eval  Hip flexion    Hip extension    Hip abduction    Hip adduction    Hip internal rotation    Hip external rotation    Knee flexion 125 90  Knee extension 0 0  Ankle dorsiflexion    Ankle plantarflexion    Ankle inversion    Ankle eversion     (Blank rows = not tested)  LOWER EXTREMITY MMT:  MMT Right eval Left eval  Hip flexion 3+ 3+  Hip extension 3+ 3+  Hip abduction 3+ 3+  Hip adduction    Hip internal rotation    Hip external rotation    Knee flexion 5 5*  Knee extension 5  5*  Ankle dorsiflexion    Ankle plantarflexion    Ankle inversion    Ankle eversion     (Blank rows = not tested, * = pain)  LOWER EXTREMITY SPECIAL TESTS:  FABER: Limited due to decreased knee bend  FUNCTIONAL TESTS:  5 times sit to stand: 29.63 sec  GAIT: Distance walked: Into clinic Assistive device utilized: Walker - 4 wheeled Level of assistance: Modified independence Comments: Antalgic, decreased L LE weight shift, keeps knee mostly extended   TODAY'S TREATMENT:                                                                                                                              DATE: 10/05/23 See HEP below  Manual therapy:  Skilled assessment and palpation for TPDN  Trigger Point Dry-Needling  Treatment instructions: Expect mild to moderate muscle soreness. S/S of pneumothorax if dry needled over a lung field, and to seek immediate medical attention should they occur. Patient verbalized understanding of these instructions and education.  Patient Consent Given: Yes Education handout provided: Yes Muscles treated: L VMO and vastus lateralis Electrical stimulation performed: No Parameters: N/A Treatment response/outcome: twitch response, decreased muscle tension  STM & TPR L quad and adductors  PATIENT EDUCATION:  Education details: Exam findings, POC, initial HEP, pt to self massage her trigger points Person educated: Patient Education method: Explanation, Demonstration, and Handouts Education comprehension: verbalized understanding, returned demonstration, and needs further education  HOME EXERCISE PROGRAM: Access Code: [email or web address] URL: [email or web address] Date: 10/05/2023 Prepared by: Vernon Prey April Kirstie Peri  Exercises - Supine Bridge  - 1 x daily - 7 x weekly - 2 sets - 10 reps - Bent Knee Fallouts  - 1 x daily - 7 x weekly - 2 sets - 10 reps - Seated Hamstring Stretch  - 1 x daily - 7 x weekly - 1 sets - 30 sec hold - Straight Leg Raise  with External Rotation  - 1 x daily - 7 x weekly - 2 sets - 10 reps  ASSESSMENT:  CLINICAL IMPRESSION: Patient is a  62 y.o. F who was seen today for physical therapy evaluation and treatment for bilat knee pain and decreased balance. PMH significant for multiple falls, 7 months post op L TKA in March, and chronic back pain. Assessment demos very weak hips and core, reduced L knee ROM with pain, multiple L LE trigger points, and high fall risk based on her 5x STS. Performed trial of TPDN to address her L quad pain. Pt would highly benefit from PT to improve on these deficits; however, has financial constraints due to high copay. Discussed 1 land visit of PT to continue to establish POC and then transition to [employer]'s PT pro bono clinic and then 1 aquatic visit to update her aquatic HEP to include more balance.    OBJECTIVE IMPAIRMENTS: Abnormal gait, decreased activity tolerance, decreased balance, decreased endurance, decreased mobility, difficulty walking, decreased ROM, decreased strength, hypomobility, increased edema, increased fascial restrictions, increased muscle spasms, impaired flexibility, improper body mechanics, postural dysfunction, and pain.   ACTIVITY LIMITATIONS: bending, sitting, standing, squatting, stairs, transfers, bed mobility, bathing, toileting, dressing, and locomotion level  PARTICIPATION LIMITATIONS: meal prep, cleaning, laundry, shopping, community activity, occupation, and yard work  PERSONAL FACTORS: Fitness, Past/current experiences, Profession, and Time since onset of injury/illness/exacerbation are also affecting patient's functional outcome.   REHAB POTENTIAL: Fair chronicity of issues and financial constraints are limiting factors  CLINICAL DECISION MAKING: Evolving/moderate complexity  EVALUATION COMPLEXITY: Moderate   GOALS: Goals reviewed with patient? Yes  SHORT TERM GOALS: Target date: 11/02/2023  Pt will be ind with HEP on land and in  the pool Baseline: Goal status: INITIAL  2.  Pt will have an established plan to continue her therapy at a pro bono clinic Baseline:  Goal status: INITIAL  3.  Pt will have improved 5x STS to </=25 sec for increased functional strength and balance Baseline:  Goal status: INITIAL  4.  Pt will be able to obtain at least 100 deg of knee flexion Baseline:  Goal status: INITIAL    PLAN:  PT FREQUENCY:  1 land visit and 1 aquatic visit due to pt's financial constraints  PT DURATION: 4 weeks  PLANNED INTERVENTIONS: 97164- PT Re-evaluation, 97110-Therapeutic exercises, 97530- Therapeutic activity, 97112- Neuromuscular re-education, 97535- Self Care, 81191- Manual therapy, [id number]- Gait training, [phone]- Aquatic Therapy, 97014- Electrical stimulation (unattended), 97016- Vasopneumatic device, [id number]- Ionotophoresis 4mg /ml Dexamethasone, Patient/Family education, Balance training, Stair training, Taping, Dry Needling, Joint mobilization, Scar mobilization, Cryotherapy, and Moist heat  PLAN FOR NEXT SESSION: Assess response to HEP. Provide info for pro bono clinic, progress her HEP to include more balance exercises.    Quinnten Calvin April Ma L Stephannie Broner, PT 10/05/2023, 12:54 PM

## 2023-10-06 NOTE — Telephone Encounter (Signed)
Pharmacy Patient Advocate Encounter  Received notification from [facility] that Prior Authorization for Dexcom G7 sensor has been APPROVED through 10/03/2024   PA #/Case ID/Reference #: [id number]

## 2023-10-07 ENCOUNTER — Telehealth: Payer: Self-pay | Admitting: *Deleted

## 2023-10-07 NOTE — Telephone Encounter (Signed)
Called patient to give lab results for LP(a) drawn as part of [employer] heart research study. Left message for patient to call me back.  [place] Shoaib Siefker, Research Coordinator 10-07-2023

## 2023-10-12 NOTE — Addendum Note (Signed)
Addended by: Jules Husbands MARIE L on: 10/12/2023 12:39 PM   Modules accepted: Orders

## 2023-10-20 ENCOUNTER — Ambulatory Visit (HOSPITAL_BASED_OUTPATIENT_CLINIC_OR_DEPARTMENT_OTHER): Payer: BC Managed Care – PPO | Attending: Physician Assistant | Admitting: Physical Therapy

## 2023-10-20 ENCOUNTER — Encounter (HOSPITAL_BASED_OUTPATIENT_CLINIC_OR_DEPARTMENT_OTHER): Payer: Self-pay | Admitting: Physical Therapy

## 2023-10-20 DIAGNOSIS — M25662 Stiffness of left knee, not elsewhere classified: Secondary | ICD-10-CM | POA: Diagnosis not present

## 2023-10-20 DIAGNOSIS — R600 Localized edema: Secondary | ICD-10-CM | POA: Diagnosis not present

## 2023-10-20 DIAGNOSIS — R2681 Unsteadiness on feet: Secondary | ICD-10-CM | POA: Diagnosis not present

## 2023-10-20 DIAGNOSIS — M25562 Pain in left knee: Secondary | ICD-10-CM | POA: Diagnosis not present

## 2023-10-20 DIAGNOSIS — M6281 Muscle weakness (generalized): Secondary | ICD-10-CM

## 2023-10-20 DIAGNOSIS — Z96652 Presence of left artificial knee joint: Secondary | ICD-10-CM | POA: Diagnosis not present

## 2023-10-20 DIAGNOSIS — R2689 Other abnormalities of gait and mobility: Secondary | ICD-10-CM | POA: Diagnosis not present

## 2023-10-20 NOTE — Therapy (Signed)
OUTPATIENT PHYSICAL THERAPY LOWER EXTREMITY TREATMENT   Patient Name: [name] MRN: [id number] DOB:05/05/1961, 62 y.o., female Today's Date: 10/20/2023  END OF SESSION:  PT End of Session - 10/20/23 0820     Visit Number 2    Date for PT Re-Evaluation 11/02/23    Authorization Type [employer] State $72 copay    PT Start Time 0816    PT Stop Time 0859    PT Time Calculation (min) 43 min    Behavior During Therapy [facility] for tasks assessed/performed             Past Medical History:  Diagnosis Date   Arthritis    Campylobacter enteritis May 2012   [facility] admission   Carpal tunnel syndrome, bilateral    Diabetes mellitus    Gastritis with bleeding due to alcohol 2010   [facility] admission   GERD (gastroesophageal reflux disease)    Hyperlipidemia    Hypertension    no meds   Menopause    Myocardial infarction (HCC)    Neck pain    Wears glasses    Past Surgical History:  Procedure Laterality Date   CARPAL TUNNEL RELEASE Left 05/14/2014   Procedure: LEFT ULNAR NEUROPLASTY AT ELBOW AND ENDOSCOPIC CARPAL TUNNEL RELEASE;  Surgeon: Jodi Marble, MD;  Location: [facility];  Service: Orthopedics;  Laterality: Left;   CORONARY STENT INTERVENTION N/A 10/09/2019   Procedure: CORONARY STENT INTERVENTION;  Surgeon: Yates Decamp, MD;  Location: [facility];  Service: Cardiovascular;  Laterality: N/A;   CYST EXCISION  1995   under arms   endometrial biopsy  2004   benign   FOOT OSTEOTOMY  2010   right-spurs   LEFT HEART CATH AND CORONARY ANGIOGRAPHY N/A 10/09/2019   Procedure: LEFT HEART CATH AND CORONARY ANGIOGRAPHY;  Surgeon: Yates Decamp, MD;  Location: [facility];  Service: Cardiovascular;  Laterality: N/A;   TONSILLECTOMY     TOTAL KNEE ARTHROPLASTY Left 02/26/2023   Procedure: LEFT TOTAL KNEE ARTHROPLASTY;  Surgeon: Kathryne Hitch, MD;  Location: [facility];  Service: Orthopedics;  Laterality: Left;  Needs RNFA   ULNAR NERVE TRANSPOSITION Left  05/14/2014   Procedure: ULNAR NERVE DECOMPRESSION/TRANSPOSITION;  Surgeon: Jodi Marble, MD;  Location: [facility];  Service: Orthopedics;  Laterality: Left;   Patient Active Problem List   Diagnosis Date Noted   Mixed hyperlipidemia due to type 2 diabetes mellitus (HCC) 06/02/2023   Marijuana use, continuous 06/02/2023   Diabetic peripheral neuropathy associated with type 2 diabetes mellitus (HCC) 06/02/2023   Intractable vomiting with nausea 06/02/2023   Abdominal pain 06/01/2023   SIRS (systemic inflammatory response syndrome) (HCC) 06/01/2023   High anion gap metabolic acidosis 06/01/2023   Dehydration 06/01/2023   Acute prerenal azotemia 06/01/2023   Arthrofibrosis of total knee arthroplasty, subsequent encounter 04/07/2023   Status post left knee replacement 02/26/2023   Unilateral primary osteoarthritis, left knee 09/09/2022   Intractable nausea and vomiting 07/04/2022   Hematemesis 07/04/2022   Hypokalemia 07/04/2022   Lactic acidosis 07/04/2022   Pain due to onychomycosis of toenails of both feet 04/28/2022   Diabetic neuropathy, type II diabetes mellitus (HCC) 04/28/2022   De Quervain's tenosynovitis, left 11/13/2021   Bilateral injections given May 21, 2020 degenerative arthritis of knee, bilateral 05/21/2020   Traumatic hematoma of female breast 11/29/2019   Dizziness 11/29/2019   Hospital discharge follow-up 10/23/2019   NSTEMI (non-ST elevated myocardial infarction) (HCC) 10/10/2019   CAD (coronary artery  disease) 10/10/2019   Chest pain 10/09/2019   Elevated troponin 10/09/2019   Uncontrolled type 2 diabetes mellitus with hyperglycemia (HCC) 10/09/2019   Obesity (BMI 30.0-34.9) 10/09/2019   Cervical spondylitis with radiculitis (HCC) 08/29/2019   Acute bursitis of left shoulder 01/30/2019   Chronic midline posterior neck pain 06/28/2018   Urgency incontinence 06/28/2018   Degenerative joint disease of knee, left 04/26/2018   Carpal tunnel  syndrome on both sides 03/29/2018   Anemia 01/02/2018   Class 1 obesity with serious comorbidity and body mass index (BMI) of 32.0 to 32.9 in adult 07/23/2017   Left hip pain 05/04/2017   Carpal tunnel syndrome on right 12/27/2016   Polyarthralgia 11/09/2016   Uncontrolled type 2 diabetes mellitus with hyperglycemia, with long-term current use of insulin (HCC) 07/07/2016   Visit for preventive health examination 06/21/2016   Increased urinary frequency 06/21/2016   Chronic meniscal tear of knee 08/19/2015   Left knee pain 06/19/2015   Candidiasis of skin 06/18/2015   Genital herpes 07/31/2014   Ulnar nerve compression 09/21/2013   Noncompliance with diet and medication regimen 11/15/2012   Boil, axilla 08/14/2012   Arthritis    Screening for breast cancer 08/04/2011   Screening for colon cancer 08/04/2011   HIDRADENITIS SUPPURATIVA 04/11/2007   GERD without esophagitis 11/17/2006   ENDOMETRIAL POLYP 11/17/2006   LOW BACK PAIN 11/17/2006   FIBROIDS, UTERUS 11/16/2006   Hyperlipidemia 11/16/2006   Essential hypertension 11/16/2006    PCP: Georganna Skeans, MD  REFERRING PROVIDER: Marcella Dubs  REFERRING DIAG: [id number] (ICD-10-CM) - History of left knee replacement  THERAPY DIAG:  Acute pain of left knee  Stiffness of left knee, not elsewhere classified  Muscle weakness (generalized)  Rationale for Evaluation and Treatment: Rehabilitation  ONSET DATE: falls since April 2024, back pain chronic, knee pain since March 2024  SUBJECTIVE:   SUBJECTIVE STATEMENT: Pt reports no new changes since last visit.  "I'm still hurting. My knee is still sore".   PERTINENT HISTORY: 02/26/23 L TKA (still unable to get full bend)  PAIN:  Are you having pain? Yes: NPRS scale: 6/10  -(when shown faces scale) Pain location: L knee Pain description: anterior and mediolateral Aggravating factors: not sure Relieving factors: not sure  PRECAUTIONS: Fall  RED  FLAGS: None   WEIGHT BEARING RESTRICTIONS: No  FALLS:  Has patient fallen in last 6 months? Yes. 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  DIAGNOSTIC FINDINGS: Radiographs: Left knee 2 views: Status post left total knee arthroplasty well-seated components. No acute fractures or acute findings. Knee is well located.   PATIENT SURVEYS:  Lower Extremity Functional Score: 12 / 80 = 15.0 %  COGNITION: Overall cognitive status: Within functional limits for tasks assessed     SENSATION: WFL  EDEMA:  L knee swollen  MUSCLE LENGTH: Hamstrings: Limited due to knee ROM Thomas test: did not assess  POSTURE: flexed trunk  and weight shift right  PALPATION: Trigger points most notable in L VMO, rectus femoris, vastus lateralis, adductors TTP pes anserine and  patellar tendon  LOWER EXTREMITY ROM:  Active ROM Right eval Left eval  Hip flexion    Hip extension    Hip abduction    Hip adduction    Hip internal rotation    Hip external rotation    Knee flexion 125 90  Knee extension 0 0  Ankle dorsiflexion    Ankle plantarflexion    Ankle inversion    Ankle eversion     (Blank rows = not tested)  LOWER EXTREMITY MMT:  MMT Right eval Left eval  Hip flexion 3+ 3+  Hip extension 3+ 3+  Hip abduction 3+ 3+  Hip adduction    Hip internal rotation    Hip external rotation    Knee flexion 5 5*  Knee extension 5 5*  Ankle dorsiflexion    Ankle plantarflexion    Ankle inversion    Ankle eversion     (Blank  rows = not tested, * = pain)  LOWER EXTREMITY SPECIAL TESTS:  FABER: Limited due to decreased knee bend  FUNCTIONAL TESTS:  5 times sit to stand: 29.63 sec  GAIT: Distance walked: Into clinic Assistive device utilized: Walker - 4 wheeled Level of assistance: Modified independence Comments: Antalgic, decreased L LE weight shift, keeps knee mostly extended   TODAY'S TREATMENT:                                                                                                                              DATE: 10/20/23 Pt seen for aquatic therapy today.  Treatment took place in water 3.5-4.75 ft in depth at the [facility] pool. Temp of water was 91.  Pt entered/exited the pool via stairs in step-to pattern independently with bilat rail (cues for step through pattern)  * with UE on barbell:  multiple laps of walking forward/ backward with cues for even step length and knee flexion during swing through;  side stepping with cues for increased step height; marching forward/ backward * UE On wall:  hamstring curls x 10, hip abdct/ addct x 10; relaxed squats x 10 (increased R hip pain);  LE swings into hip flex/ext x 10 each  * return to walking forward/ backward with UE on barbell for rest and recovery * UE on yellow hand floats:  3 way toe touch x 5 reps each LE;  * grapevine R/L  2 laps * UE on wall single leg clams  * L forward step ups x 10, cues for form  Pt requires the buoyancy and hydrostatic pressure of water for support, and to offload joints by unweighting joint load by at least 50 % in navel deep water and by at least 75-80% in chest to neck deep water.  Viscosity of the water is needed for resistance of strengthening. Water current perturbations provides challenge to standing balance requiring increased core activation.    10/05/23 See HEP below  Manual therapy:  Skilled assessment and palpation for TPDN  Trigger  Point Dry-Needling  Treatment instructions: Expect mild  to moderate muscle soreness. S/S of pneumothorax if dry needled over a lung field, and to seek immediate medical attention should they occur. Patient verbalized understanding of these instructions and education.  Patient Consent Given: Yes Education handout provided: Yes Muscles treated: L VMO and vastus lateralis Electrical stimulation performed: No Parameters: N/A Treatment response/outcome: twitch response, decreased muscle tension  STM & TPR L quad and adductors  PATIENT EDUCATION:  Education details: reacquaint with aquatic therapy; issued HEP  Person educated: Patient Education method: Explanation, Demonstration, and Handouts Education comprehension: verbalized understanding, returned demonstration, and needs further education  HOME EXERCISE PROGRAM: Access Code: [email or web address] URL: [email or web address] Date: 10/05/2023 Prepared by: Vernon Prey April Kirstie Peri  Exercises - Supine Bridge  - 1 x daily - 7 x weekly - 2 sets - 10 reps - Bent Knee Fallouts  - 1 x daily - 7 x weekly - 2 sets - 10 reps - Seated Hamstring Stretch  - 1 x daily - 7 x weekly - 1 sets - 30 sec hold - Straight Leg Raise with External Rotation  - 1 x daily - 7 x weekly - 2 sets - 10 reps  Aquatic Access Code: [email or web address] URL: [email or web address] Date: 10/20/2023 Prepared by: [facility] [address] This aquatic home exercise program from MedBridge utilizes pictures from land based exercises, but has been adapted prior to lamination and issuance.    ASSESSMENT:  CLINICAL IMPRESSION: Reviewed previously issued HEP and updated with new exercises. Pt requires heavy cues at times for increased L knee flexion.  Issued laminated exercises and verbally reviewed.  Pt reported slight reduction of pain while exercising in water.   PT at eval had discussed 1 land visit of PT to continue to establish POC and then transition to [employer]'s PT pro bono clinic.  Goals  are ongoing.   OBJECTIVE IMPAIRMENTS: Abnormal gait, decreased activity tolerance, decreased balance, decreased endurance, decreased mobility, difficulty walking, decreased ROM, decreased strength, hypomobility, increased edema, increased fascial restrictions, increased muscle spasms, impaired flexibility, improper body mechanics, postural dysfunction, and pain.   ACTIVITY LIMITATIONS: bending, sitting, standing, squatting, stairs, transfers, bed mobility, bathing, toileting, dressing, and locomotion level  PARTICIPATION LIMITATIONS: meal prep, cleaning, laundry, shopping, community activity, occupation, and yard work  PERSONAL FACTORS: Fitness, Past/current experiences, Profession, and Time since onset of injury/illness/exacerbation are also affecting patient's functional outcome.   REHAB POTENTIAL: Fair chronicity of issues and financial constraints are limiting factors  CLINICAL DECISION MAKING: Evolving/moderate complexity  EVALUATION COMPLEXITY: Moderate   GOALS: Goals reviewed with patient? Yes  SHORT TERM GOALS: Target date: 11/02/2023  Pt will be ind with HEP on land and in the pool Baseline: Goal status: INITIAL  2.  Pt will have an established plan to continue her therapy at a pro bono clinic Baseline:  Goal status: INITIAL  3.  Pt will have improved 5x STS to </=25 sec for increased functional strength and balance Baseline:  Goal status: INITIAL  4.  Pt will be able to obtain at least 100 deg of knee flexion Baseline:  Goal status: INITIAL    PLAN:  PT FREQUENCY:  1 land visit and 1 aquatic visit due to pt's financial constraints  PT DURATION: 4 weeks  PLANNED INTERVENTIONS: 97164- PT Re-evaluation, 97110-Therapeutic exercises, 97530- Therapeutic activity, [id number]- Neuromuscular re-education, 97535- Self Care, 30865- Manual therapy, [id number]- Gait training, [id number]- Aquatic Therapy, 97014- Electrical stimulation (unattended), 97016- Vasopneumatic device, [id number]-  Ionotophoresis 4mg /ml Dexamethasone,  Patient/Family education, Balance training, Stair training, Taping, Dry Needling, Joint mobilization, Scar mobilization, Cryotherapy, and Moist heat  PLAN FOR NEXT SESSION: Assess response to HEP. Provide info for pro bono clinic, progress her HEP to include more balance exercises.   Mayer Camel, PTA 10/20/23 9:23 AM [facility] [address] [place], [place], [address] Phone: [phone]   Fax:  [phone]

## 2023-10-26 ENCOUNTER — Encounter: Payer: Self-pay | Admitting: Podiatry

## 2023-10-26 ENCOUNTER — Ambulatory Visit (INDEPENDENT_AMBULATORY_CARE_PROVIDER_SITE_OTHER): Payer: BC Managed Care – PPO | Admitting: Podiatry

## 2023-10-26 ENCOUNTER — Ambulatory Visit: Payer: BC Managed Care – PPO | Admitting: Physical Therapy

## 2023-10-26 VITALS — Ht 63.0 in | Wt 162.6 lb

## 2023-10-26 DIAGNOSIS — E1140 Type 2 diabetes mellitus with diabetic neuropathy, unspecified: Secondary | ICD-10-CM

## 2023-10-26 DIAGNOSIS — R600 Localized edema: Secondary | ICD-10-CM

## 2023-10-26 DIAGNOSIS — B351 Tinea unguium: Secondary | ICD-10-CM

## 2023-10-26 DIAGNOSIS — M79674 Pain in right toe(s): Secondary | ICD-10-CM

## 2023-10-26 DIAGNOSIS — M79675 Pain in left toe(s): Secondary | ICD-10-CM | POA: Diagnosis not present

## 2023-10-26 DIAGNOSIS — R2681 Unsteadiness on feet: Secondary | ICD-10-CM

## 2023-10-26 DIAGNOSIS — M6281 Muscle weakness (generalized): Secondary | ICD-10-CM

## 2023-10-26 DIAGNOSIS — M25662 Stiffness of left knee, not elsewhere classified: Secondary | ICD-10-CM

## 2023-10-26 DIAGNOSIS — R2689 Other abnormalities of gait and mobility: Secondary | ICD-10-CM

## 2023-10-26 DIAGNOSIS — M25562 Pain in left knee: Secondary | ICD-10-CM | POA: Diagnosis not present

## 2023-10-26 NOTE — Therapy (Signed)
OUTPATIENT PHYSICAL THERAPY LOWER EXTREMITY TREATMENT AND DISCHARGE  PHYSICAL THERAPY DISCHARGE SUMMARY  Visits from Start of Care: 3  Current functional level related to goals / functional outcomes: See below   Remaining deficits: See below   Education / Equipment: Pro bono clinic. Finalized HEP   Patient agrees to discharge. Patient goals were met. Patient is being discharged due to financial reasons.    Patient Name: [name] MRN: [id number] DOB:May 11, 1961, 62 y.o., female Today's Date: 10/26/2023  END OF SESSION:  PT End of Session - 10/26/23 0921     Visit Number 3    Date for PT Re-Evaluation 11/02/23    Authorization Type [employer] State $72 copay    PT Start Time 0925    PT Stop Time 1010    PT Time Calculation (min) 45 min    Activity Tolerance Patient tolerated treatment well;Patient limited by fatigue    Behavior During Therapy [facility] for tasks assessed/performed              Past Medical History:  Diagnosis Date   Arthritis    Campylobacter enteritis May 2012   [facility] admission   Carpal tunnel syndrome, bilateral    Diabetes mellitus    Gastritis with bleeding due to alcohol 2010   [facility] admission   GERD (gastroesophageal reflux disease)    Hyperlipidemia    Hypertension    no meds   Menopause    Myocardial infarction (HCC)    Neck pain    Wears glasses    Past Surgical History:  Procedure Laterality Date   CARPAL TUNNEL RELEASE Left 05/14/2014   Procedure: LEFT ULNAR NEUROPLASTY AT ELBOW AND ENDOSCOPIC CARPAL TUNNEL RELEASE;  Surgeon: Jodi Marble, MD;  Location: [facility];  Service: Orthopedics;  Laterality: Left;   CORONARY STENT INTERVENTION N/A 10/09/2019   Procedure: CORONARY STENT INTERVENTION;  Surgeon: Yates Decamp, MD;  [facility];  Service: Cardiovascular;  Laterality: N/A;   CYST EXCISION  1995   under arms   endometrial biopsy  2004   benign   FOOT OSTEOTOMY  2010   right-spurs   LEFT HEART  CATH AND CORONARY ANGIOGRAPHY N/A 10/09/2019   Procedure: LEFT HEART CATH AND CORONARY ANGIOGRAPHY;  Surgeon: Yates Decamp, MD;  [facility]: [facility];  Service: Cardiovascular;  Laterality: N/A;   TONSILLECTOMY     TOTAL KNEE ARTHROPLASTY Left 02/26/2023   Procedure: LEFT TOTAL KNEE ARTHROPLASTY;  Surgeon: Kathryne Hitch, MD;  Location: [facility];  Service: Orthopedics;  Laterality: Left;  Needs RNFA   ULNAR NERVE TRANSPOSITION Left 05/14/2014   Procedure: ULNAR NERVE DECOMPRESSION/TRANSPOSITION;  Surgeon: Jodi Marble, MD;  Location: [facility];  Service: Orthopedics;  Laterality: Left;   Patient Active Problem List   Diagnosis Date Noted   Mixed hyperlipidemia due to type 2 diabetes mellitus (HCC) 06/02/2023   Marijuana use, continuous 06/02/2023   Diabetic peripheral neuropathy associated with type 2 diabetes mellitus (HCC) 06/02/2023   Intractable vomiting with nausea 06/02/2023   Abdominal pain 06/01/2023   SIRS (systemic inflammatory response syndrome) (HCC) 06/01/2023   High anion gap metabolic acidosis 06/01/2023   Dehydration 06/01/2023   Acute prerenal azotemia 06/01/2023   Arthrofibrosis of total knee arthroplasty, subsequent encounter 04/07/2023   Status post left knee replacement 02/26/2023   Unilateral primary osteoarthritis, left knee 09/09/2022   Intractable nausea and vomiting 07/04/2022   Hematemesis 07/04/2022   Hypokalemia 07/04/2022   Lactic acidosis 07/04/2022  Pain due to onychomycosis of toenails of both feet 04/28/2022   Diabetic neuropathy, type II diabetes mellitus (HCC) 04/28/2022   De Quervain's tenosynovitis, left 11/13/2021   Bilateral injections given May 21, 2020 degenerative arthritis of knee, bilateral 05/21/2020   Traumatic hematoma of female breast 11/29/2019   Dizziness 11/29/2019   Hospital discharge follow-up 10/23/2019   NSTEMI (non-ST elevated myocardial infarction) (HCC) 10/10/2019   CAD (coronary artery  disease) 10/10/2019   Chest pain 10/09/2019   Elevated troponin 10/09/2019   Uncontrolled type 2 diabetes mellitus with hyperglycemia (HCC) 10/09/2019   Obesity (BMI 30.0-34.9) 10/09/2019   Cervical spondylitis with radiculitis (HCC) 08/29/2019   Acute bursitis of left shoulder 01/30/2019   Chronic midline posterior neck pain 06/28/2018   Urgency incontinence 06/28/2018   Degenerative joint disease of knee, left 04/26/2018   Carpal tunnel syndrome on both sides 03/29/2018   Anemia 01/02/2018   Class 1 obesity with serious comorbidity and body mass index (BMI) of 32.0 to 32.9 in adult 07/23/2017   Left hip pain 05/04/2017   Carpal tunnel syndrome on right 12/27/2016   Polyarthralgia 11/09/2016   Uncontrolled type 2 diabetes mellitus with hyperglycemia, with long-term current use of insulin (HCC) 07/07/2016   Visit for preventive health examination 06/21/2016   Increased urinary frequency 06/21/2016   Chronic meniscal tear of knee 08/19/2015   Left knee pain 06/19/2015   Candidiasis of skin 06/18/2015   Genital herpes 07/31/2014   Ulnar nerve compression 09/21/2013   Noncompliance with diet and medication regimen 11/15/2012   Boil, axilla 08/14/2012   Arthritis    Screening for breast cancer 08/04/2011   Screening for colon cancer 08/04/2011   HIDRADENITIS SUPPURATIVA 04/11/2007   GERD without esophagitis 11/17/2006   ENDOMETRIAL POLYP 11/17/2006   LOW BACK PAIN 11/17/2006   FIBROIDS, UTERUS 11/16/2006   Hyperlipidemia 11/16/2006   Essential hypertension 11/16/2006    PCP: Georganna Skeans, MD  REFERRING PROVIDER: Kirtland Bouchard, PA-C  REFERRING DIAG: [phone] (ICD-10-CM) - History of left knee replacement  THERAPY DIAG:  No diagnosis found.  Rationale for Evaluation and Treatment: Rehabilitation  ONSET DATE: falls since April 2024, back pain chronic, knee pain since March 2024  SUBJECTIVE:   SUBJECTIVE STATEMENT: Pt states she has been doing her exercises.    PERTINENT HISTORY: 02/26/23 L TKA (still unable to get full bend)  PAIN:  Are you having pain? Yes: NPRS scale: 6/10  -(when shown faces scale) Pain location: L knee Pain description: anterior and mediolateral Aggravating factors: not sure Relieving factors: not sure  PRECAUTIONS: Fall  RED FLAGS: None   WEIGHT BEARING RESTRICTIONS: No  FALLS:  Has patient fallen in last 6 months? Yes. 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  DIAGNOSTIC FINDINGS: Radiographs: Left knee 2 views: Status post left total knee arthroplasty well-seated components. No acute fractures or acute findings. Knee is well located.   PATIENT SURVEYS:  Lower Extremity Functional Score: 12 / 80 = 15.0 %  COGNITION: Overall cognitive status: Within functional limits for tasks assessed  SENSATION: WFL  EDEMA:  L knee swollen  MUSCLE LENGTH: Hamstrings: Limited due to knee ROM Thomas test: did not assess  POSTURE: flexed trunk  and weight shift right  PALPATION: Trigger points most notable in L VMO, rectus femoris, vastus lateralis, adductors TTP pes anserine and patellar tendon  LOWER EXTREMITY ROM:  Active ROM Right eval Left eval Left 11/26  Hip flexion     Hip extension     Hip abduction     Hip adduction     Hip internal rotation     Hip external rotation     Knee flexion 125 90 100 AAROM in supine  Knee extension 0 0   Ankle dorsiflexion     Ankle plantarflexion     Ankle inversion     Ankle eversion       (Blank rows = not tested)  LOWER EXTREMITY MMT:  MMT Right eval Left eval  Hip flexion 3+ 3+  Hip extension 3+ 3+  Hip abduction 3+ 3+  Hip adduction    Hip internal rotation    Hip external rotation    Knee flexion 5 5*  Knee extension 5 5*  Ankle dorsiflexion    Ankle plantarflexion    Ankle inversion    Ankle eversion     (Blank rows = not tested, * = pain)  LOWER EXTREMITY SPECIAL TESTS:  FABER: Limited due to decreased knee bend  FUNCTIONAL TESTS:  5 times sit to stand: 29.63 sec 19.56 sec (10/26/23)  GAIT: Distance walked: Into clinic Assistive device utilized: Environmental consultant - 4 wheeled Level of assistance: Modified independence Comments: Antalgic, decreased L LE weight shift, keeps knee mostly extended   TODAY'S TREATMENT:                                                                                                                              DATE: 10/26/23 Manual Therapy  IASTM, STM & TPR ITB, quad Therex Supine  Heel slide x10  Clamshell green TB 2x10  Bridge green TB around knees 2x10  SLR green TB around knees 2x10  Hip abd 2x10 Standing  Gastroc stretch x30 sec   Warrior I 3x10 sec  Mini squat 2x10 Gait Training Forward amb focusing on heel strike and increased L LE weight shift during stance phase   10/20/23 Pt seen for aquatic therapy today.  Treatment took place in water 3.5-4.75 ft in depth at the [facility] pool. Temp of water was 91.  Pt entered/exited the pool via stairs in step-to pattern independently with bilat rail (cues for step through pattern)  * with UE on barbell:  multiple laps of walking forward/ backward with cues for even step length and knee flexion during swing through;  side stepping with cues for increased step height; marching forward/ backward * UE On wall:  hamstring curls x 10, hip abdct/ addct x 10; relaxed squats x 10 (increased R hip pain);  LE swings into  hip flex/ext x 10 each  * return to walking forward/  backward with UE on barbell for rest and recovery * UE on yellow hand floats:  3 way toe touch x 5 reps each LE;  * grapevine R/L  2 laps * UE on wall single leg clams  * L forward step ups x 10, cues for form  Pt requires the buoyancy and hydrostatic pressure of water for support, and to offload joints by unweighting joint load by at least 50 % in navel deep water and by at least 75-80% in chest to neck deep water.  Viscosity of the water is needed for resistance of strengthening. Water current perturbations provides challenge to standing balance requiring increased core activation.    10/05/23 See HEP below  Manual therapy:  Skilled assessment and palpation for TPDN  Trigger Point Dry-Needling  Treatment instructions: Expect mild to moderate muscle soreness. S/S of pneumothorax if dry needled over a lung field, and to seek immediate medical attention should they occur. Patient verbalized understanding of these instructions and education.  Patient Consent Given: Yes Education handout provided: Yes Muscles treated: L VMO and vastus lateralis Electrical stimulation performed: No Parameters: N/A Treatment response/outcome: twitch response, decreased muscle tension  STM & TPR L quad and adductors  PATIENT EDUCATION:  Education details: reacquaint with aquatic therapy; issued HEP  Person educated: Patient Education method: Explanation, Demonstration, and Handouts Education comprehension: verbalized understanding, returned demonstration, and needs further education  HOME EXERCISE PROGRAM: Access Code: [email or web address] URL: [email or web address] Date: 10/05/2023 Prepared by: Vernon Prey April Kirstie Peri  Exercises - Supine Bridge  - 1 x daily - 7 x weekly - 2 sets - 10 reps - Bent Knee Fallouts  - 1 x daily - 7 x weekly - 2 sets - 10 reps - Seated Hamstring Stretch  - 1 x daily - 7 x weekly - 1 sets - 30 sec hold - Straight Leg Raise with External Rotation  - 1 x daily - 7 x  weekly - 2 sets - 10 reps  Aquatic Access Code: [email or web address] URL: [email or web address] Date: 10/20/2023 Prepared by: [facility] [address] This aquatic home exercise program from MedBridge utilizes pictures from land based exercises, but has been adapted prior to lamination and issuance.    ASSESSMENT:  CLINICAL IMPRESSION: Provided pt information for pro bono clinic. Updated pt's HEP as able. Pt complains of generalized joint aches and pains but mostly in L knee. L knee remains edematous. Continued to work on hip/quad strengthening and balance. Finalized pt's HEP. Pt has met her short term goals. Will d/c pt per pt request (unable to continue due to high copay).   OBJECTIVE IMPAIRMENTS: Abnormal gait, decreased activity tolerance, decreased balance, decreased endurance, decreased mobility, difficulty walking, decreased ROM, decreased strength, hypomobility, increased edema, increased fascial restrictions, increased muscle spasms, impaired flexibility, improper body mechanics, postural dysfunction, and pain.     GOALS: Goals reviewed with patient? Yes  SHORT TERM GOALS: Target date: 11/02/2023  Pt will be ind with HEP on land and in the pool Baseline: Goal status: MET  2.  Pt will have an established plan to continue her therapy at a pro bono clinic Baseline:  Goal status: MET  3.  Pt will have improved 5x STS to </=25 sec for increased functional strength and balance Baseline:  Goal status: MET (see above)  4.  Pt will be able to obtain at least 100 deg of knee flexion Baseline:  Goal status: MET (see  above)    PLAN:  PT FREQUENCY:  1 land visit and 1 aquatic visit due to pt's financial constraints  PT DURATION: 4 weeks  PLANNED INTERVENTIONS: 97164- PT Re-evaluation, 97110-Therapeutic exercises, 97530- Therapeutic activity, [id number]- Neuromuscular re-education, 97535- Self Care, 16109- Manual therapy, [id number]- Gait training, [phone]- Aquatic  Therapy, 97014- Electrical stimulation (unattended), 97016- Vasopneumatic device, 97033- Ionotophoresis 4mg /ml Dexamethasone, Patient/Family education, Balance training, Stair training, Taping, Dry Needling, Joint mobilization, Scar mobilization, Cryotherapy, and Moist heat  PLAN FOR NEXT SESSION: Assess response to HEP. Provide info for pro bono clinic, progress her HEP to include more balance exercises.   Kalil Woessner April Dell Ponto, PT, DPT 10/26/23 9:22 AM

## 2023-10-26 NOTE — Progress Notes (Signed)
This patient returns to my office for at risk foot care.  This patient requires this care by a professional since this patient will be at risk due to having diabetic neuropathy.  This patient is unable to cut nails herself since the patient cannot reach her nails.These nails are painful walking and wearing shoes.  This patient presents for at risk foot care today.  General Appearance  Alert, conversant and in no acute stress.  Vascular  Dorsalis pedis and posterior tibial  pulses are palpable  bilaterally.  Capillary return is within normal limits  bilaterally. Temperature is within normal limits  bilaterally.  Neurologic  Senn-Weinstein monofilament wire test diminished  bilaterally. Muscle power within normal limits bilaterally.  Nails Thick disfigured discolored nails with subungual debris  from hallux to fifth toes bilaterally. No evidence of bacterial infection or drainage bilaterally.  Orthopedic  No limitations of motion  feet .  No crepitus or effusions noted.  No bony pathology or digital deformities noted.  Skin  normotropic skin with no porokeratosis noted bilaterally.  No signs of infections or ulcers noted.     Onychomycosis  Pain in right toes  Pain in left toes  Consent was obtained for treatment procedures.   Mechanical debridement of nails 1-5  bilaterally performed with a nail nipper.  Filed with dremel without incident.    Return office visit   9 weeks                  Told patient to return for periodic foot care and evaluation due to potential at risk complications.   Helane Gunther DPM

## 2023-10-31 ENCOUNTER — Other Ambulatory Visit: Payer: Self-pay | Admitting: Family Medicine

## 2023-11-22 ENCOUNTER — Encounter: Payer: Self-pay | Admitting: Family Medicine

## 2023-11-22 ENCOUNTER — Ambulatory Visit (INDEPENDENT_AMBULATORY_CARE_PROVIDER_SITE_OTHER): Payer: BC Managed Care – PPO | Admitting: Family Medicine

## 2023-11-22 VITALS — BP 106/73 | HR 94 | Temp 98.2°F | Resp 18 | Ht 64.0 in | Wt 174.0 lb

## 2023-11-22 DIAGNOSIS — E1169 Type 2 diabetes mellitus with other specified complication: Secondary | ICD-10-CM

## 2023-11-22 DIAGNOSIS — R1030 Lower abdominal pain, unspecified: Secondary | ICD-10-CM

## 2023-11-22 DIAGNOSIS — Z794 Long term (current) use of insulin: Secondary | ICD-10-CM | POA: Diagnosis not present

## 2023-11-22 DIAGNOSIS — M2550 Pain in unspecified joint: Secondary | ICD-10-CM | POA: Diagnosis not present

## 2023-11-22 LAB — POCT GLYCOSYLATED HEMOGLOBIN (HGB A1C): Hemoglobin A1C: 7.7 % — AB (ref 4.0–5.6)

## 2023-11-22 MED ORDER — METHYLPREDNISOLONE 4 MG PO TBPK: ORAL_TABLET | ORAL | 0 refills | Status: DC

## 2023-11-22 NOTE — Progress Notes (Signed)
Established Patient Office Visit  Subjective    Patient ID: [name], female    DOB: 1961-01-16  Age: 62 y.o. MRN: [id number]  CC:  Chief Complaint  Patient presents with   body is hurting    Inflammation, referral     HPI [name] presents for follow up of chronic med issues including diabetes.  Patient reports that she has been following up with consultant regarding her nee and mobility issues. She reports that she is having joint pains that are worsening and she continues with lower abdominal pain and some possible stool incontinence episodes.    Outpatient Encounter Medications as of 11/22/2023  Medication Sig   Ascorbic Acid (VITAMIN C PO) Take 1 tablet by mouth daily.   aspirin 81 MG chewable tablet Chew 1 tablet (81 mg total) by mouth 2 (two) times daily.   atorvastatin (LIPITOR) 40 MG tablet Take 1 tablet (40 mg total) by mouth daily.   Blood Glucose Monitoring Suppl (ACCU-CHEK AVIVA PLUS) w/Device KIT Use to check blood sugar 2 times a day   Continuous Glucose Sensor (DEXCOM G7 SENSOR) MISC 1 Device by Does not apply route continuous.   cyanocobalamin (VITAMIN B12) 1000 MCG tablet Take 1 tablet (1,000 mcg total) by mouth daily.   diclofenac Sodium (VOLTAREN) 1 % GEL Apply 4 g topically 4 (four) times daily.   FARXIGA 10 MG TABS tablet Take 1 tablet by mouth once daily   gabapentin (NEURONTIN) 600 MG tablet Take 1 tablet (600 mg total) by mouth 3 (three) times daily. (Patient taking differently: Take 600 mg by mouth daily.)   glucose blood (BAYER CONTOUR TEST) test strip TEST BLOOD SUGARS 3 TIMES DAILY   glucose blood test strip Use to check blood sugar 2 times a day   HYDROcodone-acetaminophen (NORCO/VICODIN) 5-325 MG tablet Take 1 tablet by mouth every 6 (six) hours as needed for moderate pain (pain score 4-6).   HYDROmorphone (DILAUDID) 2 MG tablet Take 2 mg by mouth as needed for severe pain.   hydrOXYzine (ATARAX) 25 MG tablet Take 1 tablet (25 mg total) by  mouth 3 (three) times daily as needed. itching (Patient taking differently: Take 25 mg by mouth as needed for anxiety. itching)   insulin aspart (FIASP FLEXTOUCH) 100 UNIT/ML FlexTouch Pen Inject 6-20 Units into the skin 3 (three) times daily before meals. (Patient taking differently: Inject 32 Units into the skin daily.)   insulin degludec (TRESIBA FLEXTOUCH) 200 UNIT/ML FlexTouch Pen INJECT 30-34 UNITS SUBCUTANEOUSLY ONCE DAILY STRENGTH. 200 UNIT/ML   Insulin Pen Needle 32G X 4 MM MISC Use 4x a day   Lancet Devices (EASY TOUCH LANCING DEVICE) MISC Use to check diabetes 4 times daily  E 11.65   latanoprost (XALATAN) 0.005 % ophthalmic solution Place 1 drop into both eyes at bedtime.   metFORMIN (GLUCOPHAGE) 1000 MG tablet Take 1 tablet (1,000 mg total) by mouth daily with supper.   methylPREDNISolone (MEDROL DOSEPAK) 4 MG TBPK tablet Use po daily as directed   metoCLOPramide (REGLAN) 10 MG tablet Take 1 tablet (10 mg total) by mouth every 8 (eight) hours as needed for nausea.   metoprolol succinate (TOPROL-XL) 50 MG 24 hr tablet TAKE 1 TABLET BY MOUTH ONCE DAILY. TAKE  WITH  OR  IMMEDIATELY  FOLLOWING  A  MEAL   Na Sulfate-K Sulfate-Mg Sulf 17.5-3.13-1.6 GM/177ML SOLN Take by mouth.   naloxone (NARCAN) nasal spray 4 mg/0.1 mL Place 1 spray into the nose as needed (or).  ondansetron (ZOFRAN-ODT) 8 MG disintegrating tablet Take 1 tablet (8 mg total) by mouth every 8 (eight) hours as needed for nausea or vomiting.   pantoprazole (PROTONIX) 40 MG tablet Take 1 tablet by mouth twice daily   tiZANidine (ZANAFLEX) 4 MG tablet Take 1 tablet (4 mg total) by mouth every 6 (six) hours as needed for muscle spasms. (Patient taking differently: Take 4 mg by mouth daily.)   triamcinolone cream (KENALOG) 0.1 % Apply 1 Application topically daily.   Facility-Administered Encounter Medications as of 11/22/2023  Medication   0.9 %  sodium chloride infusion    Past Medical History:  Diagnosis Date   Arthritis     Campylobacter enteritis May 2012   [facility] admission   Carpal tunnel syndrome, bilateral    Diabetes mellitus    Gastritis with bleeding due to alcohol 2010   [facility] admission   GERD (gastroesophageal reflux disease)    Hyperlipidemia    Hypertension    no meds   Menopause    Myocardial infarction [facility])    Neck pain    Wears glasses     Past Surgical History:  Procedure Laterality Date   CARPAL TUNNEL RELEASE Left 05/14/2014   Procedure: LEFT ULNAR NEUROPLASTY AT ELBOW AND ENDOSCOPIC CARPAL TUNNEL RELEASE;  Surgeon: Jodi Marble, MD;  Location: [facility];  Service: Orthopedics;  Laterality: Left;   CORONARY STENT INTERVENTION N/A 10/09/2019   Procedure: CORONARY STENT INTERVENTION;  Surgeon: Yates Decamp, MD;  [facility];  Service: Cardiovascular;  Laterality: N/A;   CYST EXCISION  1995   under arms   endometrial biopsy  2004   benign   FOOT OSTEOTOMY  2010   right-spurs   LEFT HEART CATH AND CORONARY ANGIOGRAPHY N/A 10/09/2019   Procedure: LEFT HEART CATH AND CORONARY ANGIOGRAPHY;  Surgeon: Yates Decamp, MD;  [facility]: [facility];  Service: Cardiovascular;  Laterality: N/A;   TONSILLECTOMY     TOTAL KNEE ARTHROPLASTY Left 02/26/2023   Procedure: LEFT TOTAL KNEE ARTHROPLASTY;  Surgeon: Kathryne Hitch, MD;  Location: [facility];  Service: Orthopedics;  Laterality: Left;  Needs RNFA   ULNAR NERVE TRANSPOSITION Left 05/14/2014   Procedure: ULNAR NERVE DECOMPRESSION/TRANSPOSITION;  Surgeon: Jodi Marble, MD;  Location: [facility];  Service: Orthopedics;  Laterality: Left;    Family History  Problem Relation Age of Onset   Hypertension Mother    Diabetes Mother    Heart attack Mother    Stroke Father    Diabetes Father    Heart attack Sister    Diabetes Maternal Grandmother    Hypertension Maternal Grandmother    Colon cancer Neg Hx    Esophageal cancer Neg Hx    Rectal cancer Neg Hx    Stomach cancer Neg Hx      Social History   Socioeconomic History   Marital status: Single    Spouse name: Not on file   Number of children: 2   Years of education: 12   Highest education level: High school graduate  Occupational History   Not on file  Tobacco Use   Smoking status: Never   Smokeless tobacco: Never  Vaping Use   Vaping status: Never Used  Substance and Sexual Activity   Alcohol use: Not Currently   Drug use: Yes    Types: Marijuana    Comment: sometimes; last use on Tuesday, 07/20/23   Sexual activity: Yes    Birth control/protection: None  Comment: same partner x 10 years  Other Topics Concern   Not on file  Social History Narrative   She works as a Arboriculturist.   Right-handed.   One cup caffeine per day.   She lives at home with daughter.   Social Drivers of Health   Financial Resource Strain: Medium Risk (08/23/2023)   Overall Financial Resource Strain (CARDIA)    Difficulty of Paying Living Expenses: Somewhat hard  Food Insecurity: No Food Insecurity (05/31/2023)   Hunger Vital Sign    Worried About Running Out of Food in the Last Year: Never true    Ran Out of Food in the Last Year: Never true  Transportation Needs: No Transportation Needs (05/31/2023)   PRAPARE - Administrator, Civil Service (Medical): No    Lack of Transportation (Non-Medical): No  Physical Activity: Inactive (08/23/2023)   Exercise Vital Sign    Days of Exercise per Week: 0 days    Minutes of Exercise per Session: 0 min  Stress: No Stress Concern Present (08/23/2023)   [employer] of Occupational Health - Occupational Stress Questionnaire    Feeling of Stress : Not at all  Social Connections: Unknown (08/23/2023)   Social Connection and Isolation Panel [NHANES]    Frequency of Communication with Friends and Family: Patient unable to answer    Frequency of Social Gatherings with Friends and Family: More than three times a week    Attends Religious Services: More than 4 times per year     Active Member of [employer] or Organizations: Not on file    Attends Banker Meetings: More than 4 times per year    Marital Status: Not on file  Intimate Partner Violence: Not At Risk (05/31/2023)   Humiliation, Afraid, Rape, and Kick questionnaire    Fear of Current or Ex-Partner: No    Emotionally Abused: No    Physically Abused: No    Sexually Abused: No    Review of Systems  All other systems reviewed and are negative.       Objective    BP 106/73 (BP Location: Right Arm, Patient Position: Sitting, Cuff Size: Normal)   Pulse 94   Temp 98.2 F (36.8 C) (Oral)   Resp 18   Ht 5\' 4"  (1.626 m)   Wt 174 lb (78.9 kg)   SpO2 94%   BMI 29.87 kg/m   Physical Exam Vitals and nursing note reviewed.  Constitutional:      General: She is not in acute distress. Cardiovascular:     Rate and Rhythm: Normal rate and regular rhythm.  Pulmonary:     Effort: Pulmonary effort is normal.     Breath sounds: Normal breath sounds.  Abdominal:     Palpations: Abdomen is soft.     Tenderness: There is no abdominal tenderness.  Musculoskeletal:     Comments: Utilizing rollator for stability  Neurological:     General: No focal deficit present.     Mental Status: She is alert and oriented to person, place, and time.         Assessment & Plan:   Type 2 diabetes mellitus with other specified complication, with long-term current use of insulin (HCC) -     POCT glycosylated hemoglobin (Hb A1C)  Arthralgia, unspecified joint -     Ambulatory referral to Rheumatology  Lower abdominal pain -     Ambulatory referral to Gastroenterology  Other orders -     methylPREDNISolone; Use po daily  as directed  Dispense: 21 tablet; Refill: 0     Return in about 3 months (around 02/20/2024) for follow up.   Tommie Raymond, MD

## 2023-11-25 ENCOUNTER — Encounter: Payer: Self-pay | Admitting: Cardiology

## 2023-11-25 NOTE — Progress Notes (Unsigned)
Cardiology Office Note:  .   Date:  11/27/2023  ID:  [name], DOB 1961/09/06, MRN [id number] PCP: Georganna Skeans, MD  [facility] Providers Cardiologist:  Yates Decamp, MD   History of Present Illness: .   [name] is a 62 y.o. AAF  with diabetes mellitus, hyperlipidemia, prior cigarette smoking quit in 2013, smokes marijuana occasionally, CAD  Presenting with NSTEMI, S/P stenting to mid right and also to the distal LAD by implantation of DES on 10/09/2019.   Discussed the use of AI scribe software for clinical note transcription with the patient, who gave verbal consent to proceed.  History of Present Illness   The patient, with a history of knee surgery, presents with ongoing knee pain and difficulty walking. She reports needing to use a walker and experiencing difficulty with balance upon standing. Despite two surgeries, the knee has not improved to her satisfaction and continues to cause pain.  In addition to the knee pain, the patient reports constipation. She has tried various remedies, but only eating greens has provided some relief.  The patient also mentions experiencing cramping in her calves, which is severe enough to interrupt her walking. Both legs are affected, but the patient does not specify if one is worse than the other.  The patient admits to smoking marijuana but denies smoking cigarettes. She is unsure if she is currently taking atorvastatin for her cholesterol.      Review of Systems  Cardiovascular:  Positive for claudication. Negative for chest pain, dyspnea on exertion and leg swelling.   Labs   Lab Results  Component Value Date   CHOL 309 (H) 11/26/2023   CHOL 216 (H) 02/25/2023   CHOL 124 01/24/2021   Lab Results  Component Value Date   HDL 74 11/26/2023   HDL 51 02/25/2023   HDL 51 01/24/2021   Lab Results  Component Value Date   LDLCALC 214 (H) 11/26/2023   LDLCALC 141 (H) 02/25/2023   LDLCALC 55 01/24/2021   Lab Results   Component Value Date   TRIG 119 11/26/2023   TRIG 134 02/25/2023   TRIG 99 01/24/2021   Lab Results  Component Value Date   CHOLHDL 4.2 11/26/2023   CHOLHDL 4.2 02/25/2023   CHOLHDL 2.4 01/24/2021   Lab Results  Component Value Date   LDLDIRECT 94.0 09/25/2015   LDLDIRECT 89.7 11/20/2011    Lab Results  Component Value Date   CHOL 309 (H) 11/26/2023   HDL 74 11/26/2023   LDLCALC 214 (H) 11/26/2023   LDLDIRECT 94.0 09/25/2015   TRIG 119 11/26/2023   CHOLHDL 4.2 11/26/2023   Lab Results  Component Value Date   NA 136 06/02/2023   K 3.7 06/02/2023   CO2 27 06/02/2023   GLUCOSE 140 (H) 06/02/2023   BUN 14 06/02/2023   CREATININE 0.56 06/02/2023   CALCIUM 8.8 (L) 06/02/2023   GFR 122.79 07/22/2017   EGFR 106 04/20/2023   GFRNONAA >60 06/02/2023      Latest Ref Rng & Units 06/02/2023    6:27 AM 06/01/2023    6:09 AM 05/31/2023   11:40 AM  Hepatic Function  Total Protein 6.5 - 8.1 g/dL 6.4  7.0  8.6   Albumin 3.5 - 5.0 g/dL 3.6  3.7  4.6   AST 15 - 41 U/L 17  16  23    ALT 0 - 44 U/L 19  19  25    Alk Phosphatase 38 - 126 U/L 78  82  106   Total Bilirubin 0.3 - 1.2 mg/dL 0.9  0.9  1.0        Latest Ref Rng & Units 06/02/2023    6:27 AM 06/01/2023    6:09 AM 05/31/2023   11:40 AM  BMP  Glucose 70 - 99 mg/dL 638  756  433   BUN 8 - 23 mg/dL 14  18  15    Creatinine 0.44 - 1.00 mg/dL 2.95  1.88  4.16   Sodium 135 - 145 mmol/L 136  133  140   Potassium 3.5 - 5.1 mmol/L 3.7  4.2  3.4   Chloride 98 - 111 mmol/L 99  96  101   CO2 22 - 32 mmol/L 27  26  21    Calcium 8.9 - 10.3 mg/dL 8.8  8.9  9.8       Latest Ref Rng & Units 06/02/2023    6:27 AM 06/01/2023    6:09 AM 05/31/2023   11:40 AM  CBC  WBC 4.0 - 10.5 K/uL 7.5  11.7  15.0   Hemoglobin 12.0 - 15.0 g/dL 60.6  30.1  60.1   Hematocrit 36.0 - 46.0 % 41.5  42.3  50.7   Platelets 150 - 400 K/uL 321  369  418    Lab Results  Component Value Date   HGBA1C 7.7 (A) 11/22/2023   Lab Results  Component Value Date   TSH  0.903 06/01/2023     Physical Exam:   VS:  BP 130/88 (BP Location: Right Arm, Patient Position: Sitting, Cuff Size: Normal)   Pulse 74   Resp 16   Ht 5\' 4"  (1.626 m)   Wt 167 lb (75.8 kg)   SpO2 96%   BMI 28.67 kg/m    Wt Readings from Last 3 Encounters:  11/26/23 167 lb (75.8 kg)  11/22/23 174 lb (78.9 kg)  10/26/23 162 lb 9.6 oz (73.8 kg)     Physical Exam Neck:     Vascular: No carotid bruit or JVD.  Cardiovascular:     Rate and Rhythm: Normal rate and regular rhythm.     Pulses: Intact distal pulses.          Dorsalis pedis pulses are 1+ on the right side and 0 on the left side.       Posterior tibial pulses are 0 on the right side and 0 on the left side.     Heart sounds: Normal heart sounds. No murmur heard.    No gallop.  Pulmonary:     Effort: Pulmonary effort is normal.     Breath sounds: Normal breath sounds.  Abdominal:     General: Bowel sounds are normal.     Palpations: Abdomen is soft.  Musculoskeletal:     Right lower leg: No edema.     Left lower leg: No edema.     Studies Reviewed: Marland Kitchen    Left heart catheterization 10/09/2019: Hyperdynamic LV, EF 65 to 70%, EDP normal. RCA is a dominant vessel.  Ulcerated tandem 80% followed by a 99% stenosis.  SP 2.5 x 38 and a 2.5 x 8 mm resolute Onyx DES stents implanted, second stent used for distal edge dissection.  Stenosis reduced to 0% with TIMI-3 to TIMI-3 flow. Circumflex coronary artery is smooth with minimal disease.  Gives origin to large OM1 and OM 2.  Tortuous. LAD is diffusely diseased from the mid to distal segment.  There is a hazy 70 to 80% stenosis in the mid LAD.  Following which distal LAD had a 40% diffuse disease.  Mild calcification is evident.  SP 2.25 x 15 mm resolute Onyx DES postdilated with 2.25 x 8 mm [place] at 22 atmospheric pressure with 0% residual stenosis.  TIMI-3 to TIMI-3 flow.    EKG:    EKG Interpretation Date/Time:  Friday November 26 2023 09:41:50 EST Ventricular Rate:   65 PR Interval:  128 QRS Duration:  88 QT Interval:  380 QTC Calculation: 395 R Axis:   6  Text Interpretation: EKG 11/26/2023: Normal sinus rhythm heart rate of 65 bpm, normal T wave inversion in 3 and aVF not present. Confirmed by Delrae Rend [phone]) on 11/26/2023 9:45:02 AM    EKG 05/31/2023: Sinus tachycardia at rate of 107 bpm, normal axis, nonspecific T inversion, cannot exclude inferior ischemia.  Compared to 09/24/2022, T wave inversion in the inferior leads is new.  Medications and allergies    Allergies  Allergen Reactions   Penicillins Rash     Current Outpatient Medications:    Ascorbic Acid (VITAMIN C PO), Take 1 tablet by mouth daily., Disp: , Rfl:    aspirin 81 MG chewable tablet, Chew 1 tablet (81 mg total) by mouth 2 (two) times daily., Disp: 30 tablet, Rfl: 0   Blood Glucose Monitoring Suppl (ACCU-CHEK AVIVA PLUS) w/Device KIT, Use to check blood sugar 2 times a day, Disp: 1 kit, Rfl: 0   Continuous Glucose Sensor (DEXCOM G7 SENSOR) MISC, 1 Device by Does not apply route continuous., Disp: 9 each, Rfl: 0   cyanocobalamin (VITAMIN B12) 1000 MCG tablet, Take 1 tablet (1,000 mcg total) by mouth daily., Disp: 90 tablet, Rfl: 0   diclofenac Sodium (VOLTAREN) 1 % GEL, Apply 4 g topically 4 (four) times daily., Disp: 350 g, Rfl: 1   ezetimibe (ZETIA) 10 MG tablet, Take 1 tablet (10 mg total) by mouth daily., Disp: 90 tablet, Rfl: 3   FARXIGA 10 MG TABS tablet, Take 1 tablet by mouth once daily, Disp: 90 tablet, Rfl: 0   gabapentin (NEURONTIN) 600 MG tablet, Take 1 tablet (600 mg total) by mouth 3 (three) times daily. (Patient taking differently: Take 600 mg by mouth daily.), Disp: 90 tablet, Rfl: 5   glucose blood (BAYER CONTOUR TEST) test strip, TEST BLOOD SUGARS 3 TIMES DAILY, Disp: 200 each, Rfl: 11   glucose blood test strip, Use to check blood sugar 2 times a day, Disp: 100 each, Rfl: 12   HYDROcodone-acetaminophen (NORCO/VICODIN) 5-325 MG tablet, Take 1 tablet by  mouth every 6 (six) hours as needed for moderate pain (pain score 4-6)., Disp: 30 tablet, Rfl: 0   HYDROmorphone (DILAUDID) 2 MG tablet, Take 2 mg by mouth as needed for severe pain., Disp: , Rfl:    hydrOXYzine (ATARAX) 25 MG tablet, Take 1 tablet (25 mg total) by mouth 3 (three) times daily as needed. itching (Patient taking differently: Take 25 mg by mouth as needed for anxiety. itching), Disp: 30 tablet, Rfl: 0   insulin aspart (FIASP FLEXTOUCH) 100 UNIT/ML FlexTouch Pen, Inject 6-20 Units into the skin 3 (three) times daily before meals. (Patient taking differently: Inject 32 Units into the skin daily.), Disp: 30 mL, Rfl: 3   insulin degludec (TRESIBA FLEXTOUCH) 200 UNIT/ML FlexTouch Pen, INJECT 30-34 UNITS SUBCUTANEOUSLY ONCE DAILY STRENGTH. 200 UNIT/ML, Disp: 9 mL, Rfl: 0   Insulin Pen Needle 32G X 4 MM MISC, Use 4x a day, Disp: 300 each, Rfl: 3   Lancet Devices (EASY TOUCH LANCING DEVICE) MISC, Use  to check diabetes 4 times daily  E 11.65, Disp: 1 each, Rfl: 11   latanoprost (XALATAN) 0.005 % ophthalmic solution, Place 1 drop into both eyes at bedtime., Disp: , Rfl:    losartan (COZAAR) 25 MG tablet, Take 1 tablet (25 mg total) by mouth every evening., Disp: 90 tablet, Rfl: 3   metFORMIN (GLUCOPHAGE) 1000 MG tablet, Take 1 tablet (1,000 mg total) by mouth daily with supper., Disp: 90 tablet, Rfl: 3   methylPREDNISolone (MEDROL DOSEPAK) 4 MG TBPK tablet, Use po daily as directed, Disp: 21 tablet, Rfl: 0   metoCLOPramide (REGLAN) 10 MG tablet, Take 1 tablet (10 mg total) by mouth every 8 (eight) hours as needed for nausea., Disp: 30 tablet, Rfl: 0   metoprolol succinate (TOPROL-XL) 50 MG 24 hr tablet, TAKE 1 TABLET BY MOUTH ONCE DAILY. TAKE  WITH  OR  IMMEDIATELY  FOLLOWING  A  MEAL, Disp: 90 tablet, Rfl: 1   Na Sulfate-K Sulfate-Mg Sulf 17.5-3.13-1.6 GM/177ML SOLN, Take by mouth., Disp: , Rfl:    naloxone (NARCAN) nasal spray 4 mg/0.1 mL, Place 1 spray into the nose as needed (or)., Disp: , Rfl:     ondansetron (ZOFRAN-ODT) 8 MG disintegrating tablet, Take 1 tablet (8 mg total) by mouth every 8 (eight) hours as needed for nausea or vomiting., Disp: 10 tablet, Rfl: 0   pantoprazole (PROTONIX) 40 MG tablet, Take 1 tablet by mouth twice daily, Disp: 60 tablet, Rfl: 0   tiZANidine (ZANAFLEX) 4 MG tablet, Take 1 tablet (4 mg total) by mouth every 6 (six) hours as needed for muscle spasms. (Patient taking differently: Take 4 mg by mouth daily.), Disp: 30 tablet, Rfl: 0   triamcinolone cream (KENALOG) 0.1 %, Apply 1 Application topically daily., Disp: , Rfl:    atorvastatin (LIPITOR) 40 MG tablet, Take 1 tablet (40 mg total) by mouth daily., Disp: 90 tablet, Rfl: 3   ASSESSMENT AND PLAN: .      ICD-10-CM   1. Coronary artery disease involving native coronary artery of native heart with other form of angina pectoris (HCC)  I25.118 EKG 12-Lead    atorvastatin (LIPITOR) 40 MG tablet    Lipid panel    Lipid panel    Lipid panel    ezetimibe (ZETIA) 10 MG tablet    2. Claudication of both lower extremities (HCC)  I73.9 VAS [name] ABI WITH/WO TBI    VAS [name] LOWER EXTREMITY ARTERIAL DUPLEX    Lipid panel    3. Mixed hyperlipidemia  E78.2 Lipid panel    Lipid panel    Lipid panel    ezetimibe (ZETIA) 10 MG tablet    4. Essential hypertension  I10 losartan (COZAAR) 25 MG tablet    5. Low serum vitamin D  R79.89 Vitamin D 1,25 dihydroxy     Assessment and Plan    Hyperlipidemia Possible non-compliance with atorvastatin medication. Last LDL level elevated. -Check lipid profile today. -Resume atorvastatin for cholesterol control. -Lipids reviewed, do not think atorvastatin alone will bring the LDL to goal, will also add Zetia 10 mg daily.  Suspect she has familial hypercholesterolemia in view of markedly elevated LDL. -She will need lipid profile testing at next follow-up, after I review her lower extremity arterial duplex and also place orders for lipid profile testing.  Hypertension Blood  pressure under control. -Add losartan 25mg  once daily in the evening in view of diabetes status.  Claudication Reports of leg cramping and pain during walking.  Has abnormal physical exam. -Order  lower extremity arterial duplex and ABI.  Coronary artery disease of native vessel without angina pectoris Patient presented on aspirin, metoprolol succinate 50 mg daily  Constipation Reports of difficulty with bowel movements. -Advise on dietary changes and over-the-counter remedies for constipation.  General Health Maintenance -Check vitamin D levels today. -Follow-up in six months.               Signed,  Yates Decamp, MD, [facility] 11/27/2023, 5:03 AM [facility] [address] [place], [place] [address] Phone: [phone]. Fax:  [phone]

## 2023-11-26 ENCOUNTER — Ambulatory Visit: Payer: BC Managed Care – PPO | Attending: Cardiology | Admitting: Cardiology

## 2023-11-26 VITALS — BP 130/88 | HR 74 | Resp 16 | Ht 64.0 in | Wt 167.0 lb

## 2023-11-26 DIAGNOSIS — E782 Mixed hyperlipidemia: Secondary | ICD-10-CM | POA: Diagnosis not present

## 2023-11-26 DIAGNOSIS — R7989 Other specified abnormal findings of blood chemistry: Secondary | ICD-10-CM

## 2023-11-26 DIAGNOSIS — F17200 Nicotine dependence, unspecified, uncomplicated: Secondary | ICD-10-CM

## 2023-11-26 DIAGNOSIS — I25118 Atherosclerotic heart disease of native coronary artery with other forms of angina pectoris: Secondary | ICD-10-CM | POA: Diagnosis not present

## 2023-11-26 DIAGNOSIS — I10 Essential (primary) hypertension: Secondary | ICD-10-CM | POA: Diagnosis not present

## 2023-11-26 DIAGNOSIS — I739 Peripheral vascular disease, unspecified: Secondary | ICD-10-CM | POA: Diagnosis not present

## 2023-11-26 MED ORDER — LOSARTAN POTASSIUM 25 MG PO TABS: 25.0000 mg | ORAL_TABLET | Freq: Every evening | ORAL | 3 refills | Status: DC

## 2023-11-26 MED ORDER — ATORVASTATIN CALCIUM 40 MG PO TABS: 40.0000 mg | ORAL_TABLET | Freq: Every day | ORAL | 3 refills | Status: DC

## 2023-11-26 NOTE — Patient Instructions (Signed)
Medication Instructions:  REFILLED Atorvastatin START Losartan 25mg  daily in the evening *If you need a refill on your cardiac medications before your next appointment, please call your pharmacy*  Lab Work: Lipids and Vitamin D level today If you have labs (blood work) drawn today and your tests are completely normal, you will receive your results only by: MyChart Message (if you have MyChart) OR A paper copy in the mail If you have any lab test that is abnormal or we need to change your treatment, we will call you to review the results.  Testing/Procedures: ABI Ultrasound Your physician has requested that you have an ankle brachial index (ABI). During this test an ultrasound and blood pressure cuff are used to evaluate the arteries that supply the arms and legs with blood. Allow thirty minutes for this exam. There are no restrictions or special instructions.  Please note: We ask at that you not bring children with you during ultrasound (echo/ vascular) testing. Due to room size and safety concerns, children are not allowed in the ultrasound rooms during exams. Our front office staff cannot provide observation of children in our lobby area while testing is being conducted. An adult accompanying a patient to their appointment will only be allowed in the ultrasound room at the discretion of the ultrasound technician under special circumstances. We apologize for any inconvenience.  LE Arterial Duplex Your physician has requested that you have a lower or upper extremity arterial duplex. This test is an ultrasound of the arteries in the legs or arms. It looks at arterial blood flow in the legs and arms. Allow one hour for Lower and Upper Arterial scans. There are no restrictions or special instructions.  Please note: We ask at that you not bring children with you during ultrasound (echo/ vascular) testing. Due to room size and safety concerns, children are not allowed in the ultrasound rooms during  exams. Our front office staff cannot provide observation of children in our lobby area while testing is being conducted. An adult accompanying a patient to their appointment will only be allowed in the ultrasound room at the discretion of the ultrasound technician under special circumstances. We apologize for any inconvenience.  Follow-Up: At [facility], you and your health needs are our priority.  As part of our continuing mission to provide you with exceptional heart care, we have created designated Provider Care Teams.  These Care Teams include your primary Cardiologist (physician) and Advanced Practice Providers (APPs -  Physician Assistants and Nurse Practitioners) who all work together to provide you with the care you need, when you need it.  Your next appointment:   6 month(s)  Provider:   Yates Decamp, MD

## 2023-11-27 ENCOUNTER — Encounter: Payer: Self-pay | Admitting: Cardiology

## 2023-11-27 MED ORDER — EZETIMIBE 10 MG PO TABS: 10.0000 mg | ORAL_TABLET | Freq: Every day | ORAL | 3 refills | Status: DC

## 2023-12-02 ENCOUNTER — Ambulatory Visit: Payer: 59 | Admitting: Internal Medicine

## 2023-12-02 ENCOUNTER — Encounter: Payer: Self-pay | Admitting: Internal Medicine

## 2023-12-02 VITALS — BP 120/64 | HR 78 | Ht 64.0 in | Wt 169.8 lb

## 2023-12-02 DIAGNOSIS — Z794 Long term (current) use of insulin: Secondary | ICD-10-CM

## 2023-12-02 DIAGNOSIS — E663 Overweight: Secondary | ICD-10-CM

## 2023-12-02 DIAGNOSIS — E11319 Type 2 diabetes mellitus with unspecified diabetic retinopathy without macular edema: Secondary | ICD-10-CM | POA: Diagnosis not present

## 2023-12-02 DIAGNOSIS — E785 Hyperlipidemia, unspecified: Secondary | ICD-10-CM

## 2023-12-02 DIAGNOSIS — E1169 Type 2 diabetes mellitus with other specified complication: Secondary | ICD-10-CM | POA: Diagnosis not present

## 2023-12-02 MED ORDER — METFORMIN HCL ER 500 MG PO TB24: 1000.0000 mg | ORAL_TABLET | Freq: Every day | ORAL | 3 refills | Status: DC

## 2023-12-02 NOTE — Progress Notes (Signed)
 Patient ID: NKENGE SONNTAG, female   DOB: 09-12-1961, 63 y.o.   MRN: [id number]  HPI: YER CASTELLO is a 63 y.o.-year-old female, returning for f/u for DM2, dx ~2000, insulin -dependent since 2010, uncontrolled, with complications (PN, + DR). Last visit 2 months ago.  Interim history: No increased urination, + blurry vision >> but improved. She has hot flushes. She has diarrhea/stool incontinence.  She started to go to the gym before last visit. She continues now.  Reviewed HbA1c levels: Lab Results  Component Value Date   HGBA1C 7.7 (A) 11/22/2023   HGBA1C 14.4 (A) 08/23/2023   HGBA1C 8.6 (H) 06/01/2023   HGBA1C 6.2 (H) 02/15/2023   HGBA1C 6.2 (A) 10/26/2022   HGBA1C 7.8 (H) 07/04/2022   HGBA1C 6.5 (A) 04/10/2022   HGBA1C 7.7 (A) 11/10/2021   HGBA1C 7.3 (A) 01/24/2021   HGBA1C 7.1 (A) 09/13/2020   HGBA1C 6.4 (A) 05/13/2020   HGBA1C 7.6 (A) 08/31/2019   HGBA1C 7.8 (A) 11/25/2018   HGBA1C 6.8 (A) 07/25/2018   HGBA1C 6.4% 03/03/2018   HGBA1C 6.5 11/01/2017   HGBA1C 6.2 07/23/2017   HGBA1C 6.8 04/09/2017   HGBA1C 6.4 01/08/2017   HGBA1C 6.4 10/08/2016   HGBA1C 10.4 07/07/2016   HGBA1C 10.4 04/07/2016   HGBA1C 9.3 01/14/2016   HGBA1C 10.9 (H) 09/25/2015   HGBA1C 11.2 09/19/2015   HGBA1C 7.7 06/21/2015   HGBA1C 7.9 (H) 03/22/2015   HGBA1C 9.4 (H) 01/04/2015   HGBA1C 8.8 (H) 10/04/2014   HGBA1C 10.3 (H) 07/06/2014   Pt was on:  Insulin  Before breakfast Before lunch Before dinner  R 10 10 (6 units if active after the meal) 20-25  NPH 15 - 20  Also: - Metformin  1000 mg twice a day  Then on: - Metformin  2000 mg with dinner >> restarted 1000 mg with dinner - Farxiga  10 mg before b'fast - >> off  - Tresiba  U200 32 units daily  - FiAsp : 8-11 units before b'fast 8-11 units before lunch - missing 13-16 units before dinner - missing  I have recommended the following regimen: - Metformin  1000 mg with dinner - Farxiga  10 mg before b'fast - Tresiba  U200 32 >> 28 units  daily - FiAsp : >> 6-8 units before b'fast 6-8 units before lunch 10-12 units before dinner  Pt is checking her sugars >4x a day with her CGM (with receiver):   Previously:  Prev.:    Lowest sugar was 29 (?) in 11/2020 >> ... 50s >> 40s >> 54; she has hypoglycemia awareness in the 64s. Highest sugar was 280s >> 400s >> 300s  -No CKD, last BUN/creatinine:  Lab Results  Component Value Date   BUN 14 06/02/2023   CREATININE 0.56 06/02/2023   ACR was normal: Lab Results  Component Value Date   MICRALBCREAT NOTE 10/01/2023   MICRALBCREAT <4 11/12/2022   MICRALBCREAT 3 01/24/2021   MICRALBCREAT 1.8 06/27/2018   MICRALBCREAT 1.6 06/23/2017   MICRALBCREAT 0.7 06/19/2016   MICRALBCREAT 0.4 09/25/2015   MICRALBCREAT 0.3 07/30/2014   MICRALBCREAT 0.5 06/19/2013   MICRALBCREAT 1.5 01/31/2013  Not on ACE inhibitor/ARB  -+ HL; last set of lipids: Lab Results  Component Value Date   CHOL 309 (H) 11/26/2023   HDL 74 11/26/2023   LDLCALC 214 (H) 11/26/2023   LDLDIRECT 94.0 09/25/2015   TRIG 119 11/26/2023   CHOLHDL 4.2 11/26/2023  On Lipitor 40, prev. Also on Zetia  10.  - last eye exam was in 11/2022 reportedly: + DR OU, reportedly glaucoma. She  sees Dr. Mavis.    - + numbness and tingling in her feet.  She is on Neurontin .  She sees podiatry.  Last foot exam was checked 10/26/2023.  She also has a history of HTN, episodic alcohol abuse, GERD, low back pain, OA.  She went to the emergency room 06/28/2021 for nausea, vomiting, and abdominal pain.  Her lipase was high.  Ozempic  was stopped. She was also in the ED post a fall down the stairs 08/2021. L forearm was in a splint >> saw ortho She had severe sciatic back pain.  She had nausea/vomiting/abdominal pain (06/2022) - was admitted.  Lipase level was normal.  It was considered that the symptoms were related to smoking marijuana and also possibly her diabetes.   She had an almost identical admission in 05/2023.  Ozempic  was  stopped, however, she was again smoking marijuana at that time.  ROS: + see HPI  I reviewed pt's medications, allergies, PMH, social hx, family hx, and changes were documented in the history of present illness. Otherwise, unchanged from my initial visit note.  Past Medical History:  Diagnosis Date   Arthritis    Campylobacter enteritis 03/2011   [facility] admission   Carpal tunnel syndrome, bilateral    Diabetes mellitus    Gastritis with bleeding due to alcohol 2010   [facility] admission   GERD (gastroesophageal reflux disease)    HIDRADENITIS SUPPURATIVA 04/11/2007   Qualifier: Diagnosis of   By: Celinda MD, Erle Clunes        Hyperlipidemia    Hypertension    no meds   Menopause    Myocardial infarction [facility])    Neck pain    Wears glasses    Past Surgical History:  Procedure Laterality Date   CARPAL TUNNEL RELEASE Left 05/14/2014   Procedure: LEFT ULNAR NEUROPLASTY AT ELBOW AND ENDOSCOPIC CARPAL TUNNEL RELEASE;  Surgeon: Alm DELENA Hummer, MD;  Location: [facility];  Service: Orthopedics;  Laterality: Left;   CORONARY STENT INTERVENTION N/A 10/09/2019   Procedure: CORONARY STENT INTERVENTION;  Surgeon: Ladona Heinz, MD;  Location: [facility];  Service: Cardiovascular;  Laterality: N/A;   CYST EXCISION  1995   under arms   endometrial biopsy  2004   benign   FOOT OSTEOTOMY  2010   right-spurs   LEFT HEART CATH AND CORONARY ANGIOGRAPHY N/A 10/09/2019   Procedure: LEFT HEART CATH AND CORONARY ANGIOGRAPHY;  Surgeon: Ladona Heinz, MD;  Location: [facility];  Service: Cardiovascular;  Laterality: N/A;   TONSILLECTOMY     TOTAL KNEE ARTHROPLASTY Left 02/26/2023   Procedure: LEFT TOTAL KNEE ARTHROPLASTY;  Surgeon: Vernetta Lonni GRADE, MD;  Location: [facility];  Service: Orthopedics;  Laterality: Left;  Needs RNFA   ULNAR NERVE TRANSPOSITION Left 05/14/2014   Procedure: ULNAR NERVE DECOMPRESSION/TRANSPOSITION;  Surgeon: Alm DELENA Hummer, MD;  Location: [facility];  Service: Orthopedics;  Laterality: Left;   Social History   Socioeconomic History   Marital status: Single    Spouse name: Not on file   Number of children: 2   Years of education: 12   Highest education level: High school graduate  Occupational History   Not on file  Tobacco Use   Smoking status: Never   Smokeless tobacco: Never  Vaping Use   Vaping status: Never Used  Substance and Sexual Activity   Alcohol use: Not Currently   Drug use: Yes    Types: Marijuana    Comment: sometimes; last  use on Tuesday, 07/20/23   Sexual activity: Yes    Birth control/protection: None    Comment: same partner x 10 years  Other Topics Concern   Not on file  Social History Narrative   She works as a arboriculturist.   Right-handed.   One cup caffeine  per day.   She lives at home with daughter.   Social Drivers of Health   Financial Resource Strain: Medium Risk (08/23/2023)   Overall Financial Resource Strain (CARDIA)    Difficulty of Paying Living Expenses: Somewhat hard  Food Insecurity: No Food Insecurity (05/31/2023)   Hunger Vital Sign    Worried About Running Out of Food in the Last Year: Never true    Ran Out of Food in the Last Year: Never true  Transportation Needs: No Transportation Needs (05/31/2023)   PRAPARE - Administrator, Civil Service (Medical): No    Lack of Transportation (Non-Medical): No  Physical Activity: Inactive (08/23/2023)   Exercise Vital Sign    Days of Exercise per Week: 0 days    Minutes of Exercise per Session: 0 min  Stress: No Stress Concern Present (08/23/2023)   [employer] of Occupational Health - Occupational Stress Questionnaire    Feeling of Stress : Not at all  Social Connections: Unknown (08/23/2023)   Social Connection and Isolation Panel [NHANES]    Frequency of Communication with Friends and Family: Patient unable to answer    Frequency of Social Gatherings with Friends and Family: More than three times a week     Attends Religious Services: More than 4 times per year    Active Member of [employer] or Organizations: Not on file    Attends Banker Meetings: More than 4 times per year    Marital Status: Not on file  Intimate Partner Violence: Not At Risk (05/31/2023)   Humiliation, Afraid, Rape, and Kick questionnaire    Fear of Current or Ex-Partner: No    Emotionally Abused: No    Physically Abused: No    Sexually Abused: No   Current Outpatient Medications on File Prior to Visit  Medication Sig Dispense Refill   Ascorbic Acid (VITAMIN C PO) Take 1 tablet by mouth daily.     aspirin  81 MG chewable tablet Chew 1 tablet (81 mg total) by mouth 2 (two) times daily. 30 tablet 0   atorvastatin  (LIPITOR) 40 MG tablet Take 1 tablet (40 mg total) by mouth daily. 90 tablet 3   Blood Glucose Monitoring Suppl (ACCU-CHEK AVIVA PLUS) w/Device KIT Use to check blood sugar 2 times a day 1 kit 0   Continuous Glucose Sensor (DEXCOM G7 SENSOR) MISC 1 Device by Does not apply route continuous. 9 each 0   cyanocobalamin  (VITAMIN B12) 1000 MCG tablet Take 1 tablet (1,000 mcg total) by mouth daily. 90 tablet 0   diclofenac  Sodium (VOLTAREN ) 1 % GEL Apply 4 g topically 4 (four) times daily. 350 g 1   ezetimibe  (ZETIA ) 10 MG tablet Take 1 tablet (10 mg total) by mouth daily. 90 tablet 3   FARXIGA  10 MG TABS tablet Take 1 tablet by mouth once daily 90 tablet 0   gabapentin  (NEURONTIN ) 600 MG tablet Take 1 tablet (600 mg total) by mouth 3 (three) times daily. (Patient taking differently: Take 600 mg by mouth daily.) 90 tablet 5   glucose blood (BAYER CONTOUR TEST) test strip TEST BLOOD SUGARS 3 TIMES DAILY 200 each 11   glucose blood test strip Use to check  blood sugar 2 times a day 100 each 12   HYDROcodone -acetaminophen  (NORCO/VICODIN) 5-325 MG tablet Take 1 tablet by mouth every 6 (six) hours as needed for moderate pain (pain score 4-6). 30 tablet 0   HYDROmorphone  (DILAUDID ) 2 MG tablet Take 2 mg by mouth as needed  for severe pain.     hydrOXYzine  (ATARAX ) 25 MG tablet Take 1 tablet (25 mg total) by mouth 3 (three) times daily as needed. itching (Patient taking differently: Take 25 mg by mouth as needed for anxiety. itching) 30 tablet 0   insulin  aspart (FIASP  FLEXTOUCH) 100 UNIT/ML FlexTouch Pen Inject 6-20 Units into the skin 3 (three) times daily before meals. (Patient taking differently: Inject 32 Units into the skin daily.) 30 mL 3   insulin  degludec (TRESIBA  FLEXTOUCH) 200 UNIT/ML FlexTouch Pen INJECT 30-34 UNITS SUBCUTANEOUSLY ONCE DAILY STRENGTH. 200 UNIT/ML 9 mL 0   Insulin  Pen Needle 32G X 4 MM MISC Use 4x a day 300 each 3   Lancet Devices (EASY TOUCH LANCING DEVICE) MISC Use to check diabetes 4 times daily  E 11.65 1 each 11   latanoprost  (XALATAN ) 0.005 % ophthalmic solution Place 1 drop into both eyes at bedtime.     losartan  (COZAAR ) 25 MG tablet Take 1 tablet (25 mg total) by mouth every evening. 90 tablet 3   metFORMIN  (GLUCOPHAGE ) 1000 MG tablet Take 1 tablet (1,000 mg total) by mouth daily with supper. 90 tablet 3   methylPREDNISolone  (MEDROL  DOSEPAK) 4 MG TBPK tablet Use po daily as directed 21 tablet 0   metoCLOPramide  (REGLAN ) 10 MG tablet Take 1 tablet (10 mg total) by mouth every 8 (eight) hours as needed for nausea. 30 tablet 0   metoprolol  succinate (TOPROL -XL) 50 MG 24 hr tablet TAKE 1 TABLET BY MOUTH ONCE DAILY. TAKE  WITH  OR  IMMEDIATELY  FOLLOWING  A  MEAL 90 tablet 1   Na Sulfate-K Sulfate-Mg Sulf 17.5-3.13-1.6 GM/177ML SOLN Take by mouth.     naloxone  (NARCAN ) nasal spray 4 mg/0.1 mL Place 1 spray into the nose as needed (or).     ondansetron  (ZOFRAN -ODT) 8 MG disintegrating tablet Take 1 tablet (8 mg total) by mouth every 8 (eight) hours as needed for nausea or vomiting. 10 tablet 0   pantoprazole  (PROTONIX ) 40 MG tablet Take 1 tablet by mouth twice daily 60 tablet 0   tiZANidine  (ZANAFLEX ) 4 MG tablet Take 1 tablet (4 mg total) by mouth every 6 (six) hours as needed for muscle  spasms. (Patient taking differently: Take 4 mg by mouth daily.) 30 tablet 0   triamcinolone  cream (KENALOG ) 0.1 % Apply 1 Application topically daily.     No current facility-administered medications on file prior to visit.   Allergies  Allergen Reactions   Penicillins Rash   Family History  Problem Relation Age of Onset   Hypertension Mother    Diabetes Mother    Heart attack Mother    Stroke Father    Diabetes Father    Heart attack Sister    Diabetes Maternal Grandmother    Hypertension Maternal Grandmother    Colon cancer Neg Hx    Esophageal cancer Neg Hx    Rectal cancer Neg Hx    Stomach cancer Neg Hx    PE: BP 120/64   Pulse 78   Ht 5' 4 (1.626 m)   Wt 169 lb 12.8 oz (77 kg)   SpO2 98%   BMI 29.15 kg/m    Wt Readings from Last  10 Encounters:  12/02/23 169 lb 12.8 oz (77 kg)  11/26/23 167 lb (75.8 kg)  11/22/23 174 lb (78.9 kg)  10/26/23 162 lb 9.6 oz (73.8 kg)  10/01/23 162 lb 9.6 oz (73.8 kg)  08/31/23 153 lb 9.6 oz (69.7 kg)  08/23/23 153 lb (69.4 kg)  07/22/23 153 lb (69.4 kg)  06/22/23 153 lb 4 oz (69.5 kg)  06/11/23 150 lb 12.8 oz (68.4 kg)   Constitutional: overweight Eyes: no exophthalmos ENT: no masses palpated in neck, no cervical lymphadenopathy Cardiovascular: RRR, No MRG Respiratory: CTA B Musculoskeletal: no deformities Skin: no rashes Neurological: no tremor with outstretched hands  ASSESSMENT: 1. DM2, insulin -dependent, uncontrolled, with complications - peripheral neuropathy - DR   2. Overweight  3. HL  PLAN:  1. Patient with longstanding, uncontrolled, type 2 diabetes, on metformin , SGLT2 inhibitor and basal-bolus insulin  regimen, with much worsening of diabetes control (HbA1c 14.4%) when she had to come off Ozempic  due to nausea, vomiting, abdominal pain (normal lipase).  We discussed about stopping marijuana use to possibly be able to resume Ozempic  in the future.  At last visit, sugars are drastically improved after adjusting  her insulin  doses and taking the insulin  consistently.  We did not change the regimen at that time.  She was interested in a referral to nutrition, which was placed at last visit.  She did not see the nutritionist yet. -She recently had another HbA1c which was drastically improved, at 7.7%. CGM interpretation: -At today's visit, we reviewed her CGM downloads: It appears that 62% of values are in target range (goal >70%), while 36% are higher than 180 (goal <25%), and 2% are lower than 70 (goal <4%).  The calculated average blood sugar is 167.   -Reviewing the CGM trends, we have significant loss of data, but the visible blood sugars are quite fluctuating.  Her sugars during the night are better, but then increasing significantly after breakfast.  Many times she drops her blood sugars after breakfast, consistent with taking her Fiasp  too late.  At today's visit her blood sugar is 59 on arrival, after having had a hypoglycemic peaks prior to arriving here.  We gave her apple juice and applesauce and observed here in the clinic to improve her blood sugars.  We discussed about the importance of not taking Fiasp  after the meal, only before, but if she only remembers it after the meal, to take a lower dose.  Otherwise, I advised him to continue the same regimen.  We also discussed about scheduling the appointment with the nutritionist. -Since she describes diarrhea and stool incontinence I advised her to switch from regular metformin  to metformin  ER at the same dose - I advised her to: Patient Instructions  Please continue: - Metformin  1000 mg with dinner - Farxiga  10 mg before b'fast - Tresiba  U200 28 units daily - FiAsp : 6-8 units before b'fast 6-8 units before lunch 10-12 units before dinner  Take FiASP  before meals! If you need to take it after a meal, may need a lower dose.  Please schedule an appt with Leita Constable with nutrition - [phone].  Please return in 3 months.  - advised to check  sugars at different times of the day - 4x a day, rotating check times - advised for yearly eye exams >> she is UTD - return to clinic in 3 months  2. Overweight -We tried Ozempic  in the past but she had to come off due to nausea/vomiting/abdominal pain and a high lipase in  05/2021.  Afterwards, she really wanted to restart this again.  We discussed about possible side effects and possible alarm symptoms and we started her back on a low-dose of Ozempic .  However, she was admitted in 05/2023 with intractable nausea/vomiting/abdominal pain (at that time, she also reported daily marijuana use - was advised to stop).  She came off Ozempic  at that time. -Before last visit, she lost 37 pounds before the previous 3 visits combined, possibly due to glucotoxicity -She gained 9 pounds before last visit and 7 more since then  3. HL -Latest lipid panel from 10/2023 was reviewed: Extremely high LDL, otherwise fractions at goal: Lab Results  Component Value Date   CHOL 309 (H) 11/26/2023   HDL 74 11/26/2023   LDLCALC 214 (H) 11/26/2023   LDLDIRECT 94.0 09/25/2015   TRIG 119 11/26/2023   CHOLHDL 4.2 11/26/2023  -She was previously on Lipitor 40 mg daily and Zetia  10 mg daily, which I advised her to restart after her results return in 01/2023  Lela Fendt, MD PhD [facility] Endocrinology

## 2023-12-02 NOTE — Patient Instructions (Addendum)
 Please continue: - Metformin  1000 mg with dinner - Farxiga  10 mg before b'fast - Tresiba  U200 28 units daily - FiAsp : 6-8 units before b'fast 6-8 units before lunch 10-12 units before dinner  Take FiASP  before meals! If you need to take it after a meal, may need a lower dose.  Please schedule an appt with Leita Constable with nutrition - [phone].  Please return in 3 months.

## 2023-12-07 ENCOUNTER — Telehealth: Payer: Self-pay | Admitting: Orthopaedic Surgery

## 2023-12-07 NOTE — Telephone Encounter (Signed)
 Please advise if patient should be back to work. Last seen 09/22/23. TKA 02/26/23, pt has submitted forms. Thank you!

## 2023-12-08 LAB — LIPID PANEL
Chol/HDL Ratio: 4.2 {ratio} (ref 0.0–4.4)
Cholesterol, Total: 309 mg/dL — ABNORMAL HIGH (ref 100–199)
HDL: 74 mg/dL (ref >39)
LDL Chol Calc (NIH): 214 mg/dL — ABNORMAL HIGH (ref 0–99)
Triglycerides: 119 mg/dL (ref 0–149)
VLDL Cholesterol Cal: 21 mg/dL (ref 5–40)

## 2023-12-08 LAB — VITAMIN D 1,25 DIHYDROXY
Vitamin D 1, 25 (OH)2 Total: 67 pg/mL — ABNORMAL HIGH
Vitamin D2 1, 25 (OH)2: <10 pg/mL
Vitamin D3 1, 25 (OH)2: 67 pg/mL

## 2023-12-09 ENCOUNTER — Ambulatory Visit: Payer: Self-pay

## 2023-12-09 NOTE — Telephone Encounter (Signed)
 IC patient, advised we are unable to complete the paperwork as Dr. Vernetta feels from orthopedic standpoint she is able to rtw. Paperwork is left at front desk for pt to pick up. Patient wasn't very pleased with this news and I advised her to schedule an appointment if she would like.

## 2023-12-09 NOTE — Telephone Encounter (Signed)
 Chief Complaint: Knee pain  Symptoms: pain 9/10, mild swelling Frequency: comes and goes  Pertinent Negatives: Patient denies fever, redness  Disposition: [] ED /[] Urgent Care (no appt availability in office) / [x] Appointment(In office/virtual)/ []  [facility]/ [] Home Care/ [] Refused Recommended Disposition /[] [facility]/ []  Follow-up with PCP Additional Notes: Patient stated she has left knee pain that is ongoing due to knee surgery last year but it recently got worse after a fall on Sunday. Patient stated she is taking OTC ibuprofen  to treat the pain. Patient reports it hurts more when walking. Care advice given and patient was offered an urgent care appointment due to no availability in the office for the next 2 weeks. Patient declined and stated she would take the first available in the office. Patient has been scheduled 12/31/23.  Reason for Disposition  Knee pain is a chronic symptom (recurrent or ongoing AND present > 4 weeks)  Answer Assessment - Initial Assessment Questions 1. LOCATION and RADIATION: Where is the pain located?      Left knee  2. QUALITY: What does the pain feel like?  (e.g., sharp, dull, aching, burning)     Sharp and aching, stiff  3. SEVERITY: How bad is the pain? What does it keep you from doing?   (Scale 1-10; or mild, moderate, severe)   -  MILD (1-3): doesn't interfere with normal activities    -  MODERATE (4-7): interferes with normal activities (e.g., work or school) or awakens from sleep, limping    -  SEVERE (8-10): excruciating pain, unable to do any normal activities, unable to walk     9/10 4. ONSET: When did the pain start? Does it come and go, or is it there all the time?     Sunday constant  5. RECURRENT: Have you had this pain before? If Yes, ask: When, and what happened then?     I had had surgery February 26 2023  6. SETTING: Has there been any recent work, exercise or other activity that involved that part of  the body?      No  7. AGGRAVATING FACTORS: What makes the knee pain worse? (e.g., walking, climbing stairs, running)     Walking  8. ASSOCIATED SYMPTOMS: Is there any swelling or redness of the knee?     Mild swelling 9. OTHER SYMPTOMS: Do you have any other symptoms? (e.g., chest pain, difficulty breathing, fever, calf pain)     Fell Sunday  Protocols used: Knee Pain-A-AH

## 2023-12-14 ENCOUNTER — Other Ambulatory Visit: Payer: Self-pay | Admitting: Radiology

## 2023-12-14 ENCOUNTER — Telehealth: Payer: Self-pay | Admitting: Orthopaedic Surgery

## 2023-12-14 DIAGNOSIS — Z96652 Presence of left artificial knee joint: Secondary | ICD-10-CM

## 2023-12-14 NOTE — Telephone Encounter (Signed)
 Patient wanted to stay in [place]. Placed order for [employer].

## 2023-12-14 NOTE — Telephone Encounter (Signed)
 Patient called and wanted to know about what's going on with the referral for [facility]. She haven't heard nothing. CB#[phone]

## 2023-12-15 ENCOUNTER — Other Ambulatory Visit: Payer: Self-pay

## 2023-12-15 DIAGNOSIS — Z96652 Presence of left artificial knee joint: Secondary | ICD-10-CM

## 2023-12-15 DIAGNOSIS — R269 Unspecified abnormalities of gait and mobility: Secondary | ICD-10-CM

## 2023-12-15 DIAGNOSIS — T8482XD Fibrosis due to internal orthopedic prosthetic devices, implants and grafts, subsequent encounter: Secondary | ICD-10-CM

## 2023-12-17 ENCOUNTER — Encounter: Payer: 59 | Attending: Cardiology | Admitting: Dietician

## 2023-12-17 DIAGNOSIS — E11319 Type 2 diabetes mellitus with unspecified diabetic retinopathy without macular edema: Secondary | ICD-10-CM | POA: Diagnosis present

## 2023-12-17 DIAGNOSIS — Z794 Long term (current) use of insulin: Secondary | ICD-10-CM | POA: Insufficient documentation

## 2023-12-17 NOTE — Progress Notes (Signed)
Diabetes Self-Management Education  Visit Type: First/Initial  Appt. Start Time: 0910 Appt. End Time: 1015  12/17/2023  [name], identified by name and date of birth, is a 63 y.o. female with a diagnosis of Diabetes: Type 2.   ASSESSMENT  Pt presents today alone. Pt report knee pain.  Pt reports self monitoring blood sugar three times daily.  Pt states apprioiate treatment options for hypo/hyperglycemia. Pt reports going to the gym three days weekly for an hour and PT exercises at home. Pt reports stress is high.  Pt reports supportive family members. All Pt's questions were answered during this encounter.  History includes:   Past Medical History:  Diagnosis Date   Arthritis    Campylobacter enteritis 03/2011   [facility] admission   Carpal tunnel syndrome, bilateral    Diabetes mellitus    Gastritis with bleeding due to alcohol 2010   [facility] admission   GERD (gastroesophageal reflux disease)    HIDRADENITIS SUPPURATIVA 04/11/2007   Qualifier: Diagnosis of   By: Robb Matar MD, Lambert Keto        Hyperlipidemia    Hypertension    no meds   Menopause    Myocardial infarction [facility])    Neck pain    Wears glasses     Labs noted:   Lab Results  Component Value Date   HGBA1C 7.7 (A) 11/22/2023   Lab Results  Component Value Date   CHOL 309 (H) 11/26/2023   HDL 74 11/26/2023   LDLCALC 214 (H) 11/26/2023   LDLDIRECT 94.0 09/25/2015   TRIG 119 11/26/2023   CHOLHDL 4.2 11/26/2023   Last vitamin D Lab Results  Component Value Date   VD25OH 20.29 (L) 11/09/2016    Medications include:   Current Outpatient Medications:    atorvastatin (LIPITOR) 40 MG tablet, Take 1 tablet (40 mg total) by mouth daily., Disp: 90 tablet, Rfl: 3   Blood Glucose Monitoring Suppl (ACCU-CHEK AVIVA PLUS) w/Device KIT, Use to check blood sugar 2 times a day, Disp: 1 kit, Rfl: 0   cyanocobalamin (VITAMIN B12) 1000 MCG tablet, Take 1 tablet (1,000 mcg total) by mouth daily., Disp: 90 tablet, Rfl:  0   FARXIGA 10 MG TABS tablet, Take 1 tablet by mouth once daily, Disp: 90 tablet, Rfl: 0   insulin aspart (FIASP FLEXTOUCH) 100 UNIT/ML FlexTouch Pen, Inject 6-20 Units into the skin 3 (three) times daily before meals. (Patient taking differently: Inject 32 Units into the skin daily.), Disp: 30 mL, Rfl: 3   insulin degludec (TRESIBA FLEXTOUCH) 200 UNIT/ML FlexTouch Pen, INJECT 30-34 UNITS SUBCUTANEOUSLY ONCE DAILY STRENGTH. 200 UNIT/ML, Disp: 9 mL, Rfl: 0   Insulin Pen Needle 32G X 4 MM MISC, Use 4x a day, Disp: 300 each, Rfl: 3   metFORMIN (GLUCOPHAGE-XR) 500 MG 24 hr tablet, Take 2 tablets (1,000 mg total) by mouth daily with supper., Disp: 180 tablet, Rfl: 3   ondansetron (ZOFRAN-ODT) 8 MG disintegrating tablet, Take 1 tablet (8 mg total) by mouth every 8 (eight) hours as needed for nausea or vomiting., Disp: 10 tablet, Rfl: 0   pantoprazole (PROTONIX) 40 MG tablet, Take 1 tablet by mouth twice daily, Disp: 60 tablet, Rfl: 0   Ascorbic Acid (VITAMIN C PO), Take 1 tablet by mouth daily., Disp: , Rfl:    aspirin 81 MG chewable tablet, Chew 1 tablet (81 mg total) by mouth 2 (two) times daily., Disp: 30 tablet, Rfl: 0   diclofenac Sodium (VOLTAREN) 1 % GEL, Apply 4 g  topically 4 (four) times daily., Disp: 350 g, Rfl: 1   ezetimibe (ZETIA) 10 MG tablet, Take 1 tablet (10 mg total) by mouth daily., Disp: 90 tablet, Rfl: 3   gabapentin (NEURONTIN) 600 MG tablet, Take 1 tablet (600 mg total) by mouth 3 (three) times daily. (Patient taking differently: Take 600 mg by mouth daily.), Disp: 90 tablet, Rfl: 5   glucose blood (BAYER CONTOUR TEST) test strip, TEST BLOOD SUGARS 3 TIMES DAILY, Disp: 200 each, Rfl: 11   glucose blood test strip, Use to check blood sugar 2 times a day, Disp: 100 each, Rfl: 12   HYDROcodone-acetaminophen (NORCO/VICODIN) 5-325 MG tablet, Take 1 tablet by mouth every 6 (six) hours as needed for moderate pain (pain score 4-6)., Disp: 30 tablet, Rfl: 0   HYDROmorphone (DILAUDID) 2 MG  tablet, Take 2 mg by mouth as needed for severe pain., Disp: , Rfl:    hydrOXYzine (ATARAX) 25 MG tablet, Take 1 tablet (25 mg total) by mouth 3 (three) times daily as needed. itching (Patient taking differently: Take 25 mg by mouth as needed for anxiety. itching), Disp: 30 tablet, Rfl: 0   Lancet Devices (EASY TOUCH LANCING DEVICE) MISC, Use to check diabetes 4 times daily  E 11.65, Disp: 1 each, Rfl: 11   latanoprost (XALATAN) 0.005 % ophthalmic solution, Place 1 drop into both eyes at bedtime., Disp: , Rfl:    losartan (COZAAR) 25 MG tablet, Take 1 tablet (25 mg total) by mouth every evening., Disp: 90 tablet, Rfl: 3   methylPREDNISolone (MEDROL DOSEPAK) 4 MG TBPK tablet, Use po daily as directed, Disp: 21 tablet, Rfl: 0   metoCLOPramide (REGLAN) 10 MG tablet, Take 1 tablet (10 mg total) by mouth every 8 (eight) hours as needed for nausea., Disp: 30 tablet, Rfl: 0   metoprolol succinate (TOPROL-XL) 50 MG 24 hr tablet, TAKE 1 TABLET BY MOUTH ONCE DAILY. TAKE  WITH  OR  IMMEDIATELY  FOLLOWING  A  MEAL, Disp: 90 tablet, Rfl: 1   Na Sulfate-K Sulfate-Mg Sulf 17.5-3.13-1.6 GM/177ML SOLN, Take by mouth., Disp: , Rfl:    naloxone (NARCAN) nasal spray 4 mg/0.1 mL, Place 1 spray into the nose as needed (or)., Disp: , Rfl:    tiZANidine (ZANAFLEX) 4 MG tablet, Take 1 tablet (4 mg total) by mouth every 6 (six) hours as needed for muscle spasms. (Patient taking differently: Take 4 mg by mouth daily.), Disp: 30 tablet, Rfl: 0   triamcinolone cream (KENALOG) 0.1 %, Apply 1 Application topically daily., Disp: , Rfl:      There were no vitals taken for this visit. There is no height or weight on file to calculate BMI.   Diabetes Self-Management Education - 12/17/23 1022       Visit Information   Visit Type First/Initial      Initial Visit   Diabetes Type Type 2    Date Diagnosed 1986    Are you currently following a meal plan? No    Are you taking your medications as prescribed? Yes      Health  Coping   How would you rate your overall health? Fair      Psychosocial Assessment   Patient Belief/Attitude about Diabetes Defeat/Burnout    What is the hardest part about your diabetes right now, causing you the most concern, or is the most worrisome to you about your diabetes?   Getting support / problem solving    Self-care barriers None    Self-management support Doctor's office  Other persons present Patient    Patient Concerns Healthy Lifestyle;Support    Special Needs None    Preferred Learning Style No preference indicated    Learning Readiness Contemplating    How often do you need to have someone help you when you read instructions, pamphlets, or other written materials from your doctor or pharmacy? 1 - Never    What is the last grade level you completed in school? 12th      Pre-Education Assessment   Patient understands the diabetes disease and treatment process. Needs Instruction    Patient understands incorporating nutritional management into lifestyle. Needs Instruction    Patient undertands incorporating physical activity into lifestyle. Needs Instruction    Patient understands using medications safely. Needs Instruction    Patient understands monitoring blood glucose, interpreting and using results Needs Instruction    Patient understands prevention, detection, and treatment of acute complications. Needs Instruction    Patient understands prevention, detection, and treatment of chronic complications. Needs Instruction    Patient understands how to develop strategies to address psychosocial issues. Needs Instruction    Patient understands how to develop strategies to promote health/change behavior. Needs Instruction      Complications   How often do you check your blood sugar? 3-4 times/day    Fasting Blood glucose range (mg/dL) 098-119    Number of hypoglycemic episodes per month 1    Can you tell when your blood sugar is low? Yes    What do you do if your blood  sugar is low? sweaty    Number of hyperglycemic episodes ( >200mg /dL): Rare    Can you tell when your blood sugar is high? Yes    What do you do if your blood sugar is high? "move around"    Have you had a dilated eye exam in the past 12 months? Yes    Have you had a dental exam in the past 12 months? Yes    Are you checking your feet? Yes    How many days per week are you checking your feet? 7      Dietary Intake   Breakfast chicken and dumplins home made, coffee flavored and sweetened    Lunch skips or berries or klae and vegean chicken patty    Dinner chicken and dumplings home made, tuna and cracerks    Snack (evening) crackers or cookies 5-6 pecan sandies    Beverage(s) water, coffee with powdered creamer flavored, sugar 3-4 teaspoons, tea with sugar, juice once weekly      Activity / Exercise   Activity / Exercise Type Light (walking / raking leaves)    How many days per week do you exercise? 3    How many minutes per day do you exercise? 60    Total minutes per week of exercise 180      Patient Education   Previous Diabetes Education No    Disease Pathophysiology Definition of diabetes, type 1 and 2, and the diagnosis of diabetes;Explored patient's options for treatment of their diabetes    Diabetes Stress and Support Identified and addressed patients feelings and concerns about diabetes;Role of stress on diabetes;Brainstormed with patient on coping mechanisms for social situations, getting support from significant others, dealing with feelings about diabetes      Individualized Goals (developed by patient)   Nutrition Follow meal plan discussed      Post-Education Assessment   Patient understands the diabetes disease and treatment process. Needs Review    Patient  understands incorporating nutritional management into lifestyle. Needs Review    Patient undertands incorporating physical activity into lifestyle. Needs Review    Patient understands using medications safely. Needs  Review    Patient understands monitoring blood glucose, interpreting and using results Needs Review    Patient understands prevention, detection, and treatment of acute complications. Needs Review    Patient understands prevention, detection, and treatment of chronic complications. Needs Review    Patient understands how to develop strategies to address psychosocial issues. Needs Review    Patient understands how to develop strategies to promote health/change behavior. Needs Review      Outcomes   Expected Outcomes Demonstrated interest in learning. Expect positive outcomes    Future DMSE 3-4 months    Program Status Not Completed             Individualized Plan for Diabetes Self-Management Training:   Learning Objective:  Patient will have a greater understanding of diabetes self-management. Patient education plan is to attend individual and/or group sessions per assessed needs and concerns.    Expected Outcomes:  Demonstrated interest in learning. Expect positive outcomes  Education material provided: ADA - How to Thrive: A Guide for Your Journey with Diabetes, Food label handouts, My Plate, Snack sheet, and Diabetes Resources  If problems or questions, patient to contact team via:  Phone  Future DSME appointment: 3-4 months

## 2023-12-20 ENCOUNTER — Telehealth: Payer: Self-pay | Admitting: *Deleted

## 2023-12-20 ENCOUNTER — Encounter: Payer: Self-pay | Admitting: Physician Assistant

## 2023-12-20 ENCOUNTER — Ambulatory Visit: Payer: 59 | Admitting: Physician Assistant

## 2023-12-20 ENCOUNTER — Other Ambulatory Visit (INDEPENDENT_AMBULATORY_CARE_PROVIDER_SITE_OTHER): Payer: Self-pay

## 2023-12-20 DIAGNOSIS — Z96652 Presence of left artificial knee joint: Secondary | ICD-10-CM

## 2023-12-20 DIAGNOSIS — M7052 Other bursitis of knee, left knee: Secondary | ICD-10-CM | POA: Diagnosis not present

## 2023-12-20 MED ORDER — LIDOCAINE HCL 1 % IJ SOLN
1.0000 mL | INTRAMUSCULAR | Status: AC | PRN
  Administered 2023-12-20: 1 mL

## 2023-12-20 MED ORDER — METHYLPREDNISOLONE ACETATE 40 MG/ML IJ SUSP
40.0000 mg | INTRAMUSCULAR | Status: AC | PRN
  Administered 2023-12-20: 40 mg via INTRAMUSCULAR

## 2023-12-20 NOTE — Telephone Encounter (Signed)
Provider has papers at her desk. Awaiting signature that patient has brought into office

## 2023-12-20 NOTE — Progress Notes (Signed)
Office Visit Note   Patient: [name]           Date of Birth: Jul 28, 1961           MRN: [id number] Visit Date: 12/20/2023              Requested by: Georganna Skeans, MD [address] [place],  [place] [address] PCP: Georganna Skeans, MD   Assessment & Plan: Visit Diagnoses:  1. History of left knee replacement   2. Pes anserinus bursitis of left knee     Plan:  Will have her undergo the FCE and then follow-up with Dr. Raye Sorrow after the FCE to go over the results and discuss her work status.  Questions were encouraged and answered at length today.  Follow-Up Instructions: No follow-ups on file.   Orders:  Orders Placed This Encounter  Procedures   Trigger Point Inj   XR Knee 1-2 Views Left   No orders of the defined types were placed in this encounter.     Procedures: Trigger Point Inj  Date/Time: 12/20/2023 11:55 AM  Performed by: Kirtland Bouchard, PA-C Authorized by: Kirtland Bouchard, PA-C   Total # of Trigger Points:  1 Location: lower extremity   Approach:  Medial Medications #1:  1 mL lidocaine 1 %; 40 mg methylPREDNISolone acetate 40 MG/ML    Clinical Data: No additional findings.   Subjective: Chief Complaint  Patient presents with   Left Knee - Pain, Follow-up    HPI [name] returns today due to left knee pain.  She underwent a left total knee arthroplasty in March 2024.  She still having falls.  She states she finished up with therapy and has home exercise program that she has been doing.  She is going to the the gym.  However she continues to have the need for a rollator to ambulate.  Her FCE was not performed due to the facility she was sent to no longer in them.  She does have scheduled FCE later today.  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  She has been out of work since her surgery back in March.  She again states she is not having no dizziness or passing out just leg gives way causing her to fall.  She is diabetic hemoglobin A1c  is down to 7.7.  Review of Systems  See HPI otherwise negative Objective: Vital Signs: There were no vitals taken for this visit.  Physical Exam General: Well-developed well-nourished female no acute distress mood affect appropriate Ortho Exam Left knee full extension flexion to approximately 110 degrees.  No instability valgus varus stressing.  Anterior drawer is negative.  No abnormal warmth erythema or effusion surgical incisions well-healed.  Left calf supple nontender. Specialty Comments:  No specialty comments available.  Imaging: XR Knee 1-2 Views Left Result Date: 12/20/2023 Left knee 2 views: Well-seated left total knee arthroplasty without any complicating features.  No acute fractures acute findings.    PMFS History: Patient Active Problem List   Diagnosis Date Noted   Mixed hyperlipidemia due to type 2 diabetes mellitus (HCC) 06/02/2023   Marijuana use, continuous 06/02/2023   Diabetic peripheral neuropathy associated with type 2 diabetes mellitus (HCC) 06/02/2023   Intractable vomiting with nausea 06/02/2023   Arthrofibrosis of total knee arthroplasty, subsequent encounter 04/07/2023   Status post left knee replacement 02/26/2023   Unilateral primary osteoarthritis, left knee 09/09/2022   Intractable nausea and vomiting 07/04/2022   Hypokalemia 07/04/2022   Pain  due to onychomycosis of toenails of both feet 04/28/2022   Diabetic neuropathy, type II diabetes mellitus (HCC) 04/28/2022   De Quervain's tenosynovitis, left 11/13/2021   CAD (coronary artery disease) 10/10/2019   Obesity (BMI 30.0-34.9) 10/09/2019   Cervical spondylitis with radiculitis (HCC) 08/29/2019   Chronic midline posterior neck pain 06/28/2018   Urgency incontinence 06/28/2018   Degenerative joint disease of knee, left 04/26/2018   Carpal tunnel syndrome on both sides 03/29/2018   Anemia 01/02/2018   Class 1 obesity with serious comorbidity and body mass index (BMI) of 32.0 to 32.9 in adult  07/23/2017   Left hip pain 05/04/2017   Polyarthralgia 11/09/2016   Uncontrolled type 2 diabetes mellitus with hyperglycemia, with long-term current use of insulin (HCC) 07/07/2016   Visit for preventive health examination 06/21/2016   Chronic meniscal tear of knee 08/19/2015   Left knee pain 06/19/2015   Genital herpes 07/31/2014   Ulnar nerve compression 09/21/2013   Noncompliance with diet and medication regimen 11/15/2012   Arthritis    Screening for breast cancer 08/04/2011   Screening for colon cancer 08/04/2011   GERD without esophagitis 11/17/2006   ENDOMETRIAL POLYP 11/17/2006   LOW BACK PAIN 11/17/2006   FIBROIDS, UTERUS 11/16/2006   Hyperlipidemia 11/16/2006   Essential hypertension 11/16/2006   Past Medical History:  Diagnosis Date   Arthritis    Campylobacter enteritis 03/2011   [facility] admission   Carpal tunnel syndrome, bilateral    Diabetes mellitus    Gastritis with bleeding due to alcohol 2010   [facility] admission   GERD (gastroesophageal reflux disease)    HIDRADENITIS SUPPURATIVA 04/11/2007   Qualifier: Diagnosis of   By: Robb Matar MD, Lambert Keto        Hyperlipidemia    Hypertension    no meds   Menopause    Myocardial infarction [facility])    Neck pain    Wears glasses     Family History  Problem Relation Age of Onset   Hypertension Mother    Diabetes Mother    Heart attack Mother    Stroke Father    Diabetes Father    Heart attack Sister    Diabetes Maternal Grandmother    Hypertension Maternal Grandmother    Colon cancer Neg Hx    Esophageal cancer Neg Hx    Rectal cancer Neg Hx    Stomach cancer Neg Hx     Past Surgical History:  Procedure Laterality Date   CARPAL TUNNEL RELEASE Left 05/14/2014   Procedure: LEFT ULNAR NEUROPLASTY AT ELBOW AND ENDOSCOPIC CARPAL TUNNEL RELEASE;  Surgeon: Jodi Marble, MD;  Location: [facility];  Service: Orthopedics;  Laterality: Left;   CORONARY STENT INTERVENTION N/A 10/09/2019   Procedure:  CORONARY STENT INTERVENTION;  Surgeon: Yates Decamp, MD;  Location: [facility];  Service: Cardiovascular;  Laterality: N/A;   CYST EXCISION  1995   under arms   endometrial biopsy  2004   benign   FOOT OSTEOTOMY  2010   right-spurs   LEFT HEART CATH AND CORONARY ANGIOGRAPHY N/A 10/09/2019   Procedure: LEFT HEART CATH AND CORONARY ANGIOGRAPHY;  Surgeon: Yates Decamp, MD;  Location: [facility];  Service: Cardiovascular;  Laterality: N/A;   TONSILLECTOMY     TOTAL KNEE ARTHROPLASTY Left 02/26/2023   Procedure: LEFT TOTAL KNEE ARTHROPLASTY;  Surgeon: Kathryne Hitch, MD;  Location: [facility];  Service: Orthopedics;  Laterality: Left;  Needs RNFA   ULNAR NERVE TRANSPOSITION Left 05/14/2014  Procedure: ULNAR NERVE DECOMPRESSION/TRANSPOSITION;  Surgeon: Jodi Marble, MD;  Location: [facility];  Service: Orthopedics;  Laterality: Left;   Social History   Occupational History   Not on file  Tobacco Use   Smoking status: Never   Smokeless tobacco: Never  Vaping Use   Vaping status: Never Used  Substance and Sexual Activity   Alcohol use: Not Currently   Drug use: Yes    Types: Marijuana    Comment: sometimes; last use on Tuesday, 07/20/23   Sexual activity: Yes    Birth control/protection: None    Comment: same partner x 10 years

## 2023-12-21 ENCOUNTER — Other Ambulatory Visit: Payer: Self-pay | Admitting: Family Medicine

## 2023-12-27 ENCOUNTER — Ambulatory Visit (HOSPITAL_COMMUNITY): Admission: RE | Admit: 2023-12-27 | Payer: 59 | Source: Ambulatory Visit | Attending: Cardiology | Admitting: Cardiology

## 2023-12-27 ENCOUNTER — Ambulatory Visit (HOSPITAL_COMMUNITY): Payer: 59

## 2023-12-28 ENCOUNTER — Encounter: Payer: Self-pay | Admitting: Podiatry

## 2023-12-28 ENCOUNTER — Ambulatory Visit (INDEPENDENT_AMBULATORY_CARE_PROVIDER_SITE_OTHER): Payer: Self-pay | Admitting: Podiatry

## 2023-12-28 DIAGNOSIS — M79675 Pain in left toe(s): Secondary | ICD-10-CM

## 2023-12-28 DIAGNOSIS — B351 Tinea unguium: Secondary | ICD-10-CM

## 2023-12-28 DIAGNOSIS — M79674 Pain in right toe(s): Secondary | ICD-10-CM | POA: Diagnosis not present

## 2023-12-28 DIAGNOSIS — E1140 Type 2 diabetes mellitus with diabetic neuropathy, unspecified: Secondary | ICD-10-CM

## 2023-12-28 NOTE — Progress Notes (Signed)
This patient returns to my office for at risk foot care.  This patient requires this care by a professional since this patient will be at risk due to having diabetic neuropathy.  This patient is unable to cut nails herself since the patient cannot reach her nails.These nails are painful walking and wearing shoes.  This patient presents for at risk foot care today.  General Appearance  Alert, conversant and in no acute stress.  Vascular  Dorsalis pedis and posterior tibial  pulses are palpable  bilaterally.  Capillary return is within normal limits  bilaterally. Temperature is within normal limits  bilaterally.  Neurologic  Senn-Weinstein monofilament wire test diminished  bilaterally. Muscle power within normal limits bilaterally.  Nails Thick disfigured discolored nails with subungual debris  from hallux to fifth toes bilaterally. No evidence of bacterial infection or drainage bilaterally.  Orthopedic  No limitations of motion  feet .  No crepitus or effusions noted.  No bony pathology or digital deformities noted.  Skin  normotropic skin with no porokeratosis noted bilaterally.  No signs of infections or ulcers noted.     Onychomycosis  Pain in right toes  Pain in left toes  Consent was obtained for treatment procedures.   Mechanical debridement of nails 1-5  bilaterally performed with a nail nipper.  Filed with dremel without incident.    Return office visit   9 weeks                  Told patient to return for periodic foot care and evaluation due to potential at risk complications.   Helane Gunther DPM

## 2023-12-31 ENCOUNTER — Ambulatory Visit (INDEPENDENT_AMBULATORY_CARE_PROVIDER_SITE_OTHER): Payer: 59 | Admitting: Family

## 2023-12-31 ENCOUNTER — Telehealth: Payer: Self-pay | Admitting: Orthopaedic Surgery

## 2023-12-31 ENCOUNTER — Other Ambulatory Visit: Payer: Self-pay | Admitting: Radiology

## 2023-12-31 VITALS — BP 129/77 | HR 75 | Temp 98.6°F | Ht 63.0 in | Wt 172.2 lb

## 2023-12-31 DIAGNOSIS — M62838 Other muscle spasm: Secondary | ICD-10-CM

## 2023-12-31 DIAGNOSIS — Z96652 Presence of left artificial knee joint: Secondary | ICD-10-CM

## 2023-12-31 DIAGNOSIS — E1165 Type 2 diabetes mellitus with hyperglycemia: Secondary | ICD-10-CM | POA: Diagnosis not present

## 2023-12-31 DIAGNOSIS — I10 Essential (primary) hypertension: Secondary | ICD-10-CM

## 2023-12-31 DIAGNOSIS — Z9889 Other specified postprocedural states: Secondary | ICD-10-CM | POA: Diagnosis not present

## 2023-12-31 DIAGNOSIS — M2550 Pain in unspecified joint: Secondary | ICD-10-CM

## 2023-12-31 DIAGNOSIS — K219 Gastro-esophageal reflux disease without esophagitis: Secondary | ICD-10-CM

## 2023-12-31 DIAGNOSIS — L299 Pruritus, unspecified: Secondary | ICD-10-CM

## 2023-12-31 DIAGNOSIS — Z794 Long term (current) use of insulin: Secondary | ICD-10-CM

## 2023-12-31 DIAGNOSIS — R269 Unspecified abnormalities of gait and mobility: Secondary | ICD-10-CM

## 2023-12-31 MED ORDER — METOPROLOL SUCCINATE ER 50 MG PO TB24: ORAL_TABLET | ORAL | 0 refills | Status: AC

## 2023-12-31 MED ORDER — PANTOPRAZOLE SODIUM 40 MG PO TBEC: 40.0000 mg | DELAYED_RELEASE_TABLET | Freq: Two times a day (BID) | ORAL | 0 refills | Status: DC

## 2023-12-31 MED ORDER — HYDROXYZINE HCL 25 MG PO TABS: 25.0000 mg | ORAL_TABLET | Freq: Three times a day (TID) | ORAL | 0 refills | Status: DC | PRN

## 2023-12-31 MED ORDER — TRESIBA FLEXTOUCH 200 UNIT/ML ~~LOC~~ SOPN: PEN_INJECTOR | SUBCUTANEOUS | 0 refills | Status: DC

## 2023-12-31 MED ORDER — TIZANIDINE HCL 4 MG PO TABS: 4.0000 mg | ORAL_TABLET | Freq: Four times a day (QID) | ORAL | 0 refills | Status: DC | PRN

## 2023-12-31 NOTE — Progress Notes (Signed)
Patient states her whole body hurts.  Asking for neurology referral.   Patient asking for medications to be sent in for 90 days.

## 2023-12-31 NOTE — Telephone Encounter (Signed)
Pt is requesting a new referral be sent to [facility]. [address] [place], [place], [place] [address]. Phone [phone] Fax number: [phone] for FCE

## 2023-12-31 NOTE — Progress Notes (Signed)
Patient ID: [name], female    DOB: May 15, 1961  MRN: [id number]  CC: Referrals and Medication Refills  Subjective: [name] is a 63 y.o. female who presents for referrals and medication refills. She is accompanied by her cousin.  Her concerns today include:  - Total body pain. Denies recent trauma/injury and red flag symptoms. Upcoming appointment with Rheumatology. Requests referral to Neurology. - History of left knee surgery March 2024. Denies red flag symptoms. States Orthopedics "dropped her" and needs new referral. - Needs refills of Metoprolol, Tresiba, Hydroxyzine, Protonix, and Tizanidine due to health insurance will end tomorrow due to she is newly retired.   Patient Active Problem List   Diagnosis Date Noted   Mixed hyperlipidemia due to type 2 diabetes mellitus (HCC) 06/02/2023   Marijuana use, continuous 06/02/2023   Diabetic peripheral neuropathy associated with type 2 diabetes mellitus (HCC) 06/02/2023   Intractable vomiting with nausea 06/02/2023   Arthrofibrosis of total knee arthroplasty, subsequent encounter 04/07/2023   Status post left knee replacement 02/26/2023   Unilateral primary osteoarthritis, left knee 09/09/2022   Intractable nausea and vomiting 07/04/2022   Hypokalemia 07/04/2022   Pain due to onychomycosis of toenails of both feet 04/28/2022   Diabetic neuropathy, type II diabetes mellitus (HCC) 04/28/2022   De Quervain's tenosynovitis, left 11/13/2021   CAD (coronary artery disease) 10/10/2019   Obesity (BMI 30.0-34.9) 10/09/2019   Cervical spondylitis with radiculitis (HCC) 08/29/2019   Chronic midline posterior neck pain 06/28/2018   Urgency incontinence 06/28/2018   Degenerative joint disease of knee, left 04/26/2018   Carpal tunnel syndrome on both sides 03/29/2018   Anemia 01/02/2018   Class 1 obesity with serious comorbidity and body mass index (BMI) of 32.0 to 32.9 in adult 07/23/2017   Left hip pain 05/04/2017   Polyarthralgia  11/09/2016   Uncontrolled type 2 diabetes mellitus with hyperglycemia, with long-term current use of insulin (HCC) 07/07/2016   Visit for preventive health examination 06/21/2016   Chronic meniscal tear of knee 08/19/2015   Left knee pain 06/19/2015   Genital herpes 07/31/2014   Ulnar nerve compression 09/21/2013   Noncompliance with diet and medication regimen 11/15/2012   Arthritis    Screening for breast cancer 08/04/2011   Screening for colon cancer 08/04/2011   GERD without esophagitis 11/17/2006   ENDOMETRIAL POLYP 11/17/2006   LOW BACK PAIN 11/17/2006   FIBROIDS, UTERUS 11/16/2006   Hyperlipidemia 11/16/2006   Essential hypertension 11/16/2006     Current Outpatient Medications on File Prior to Visit  Medication Sig Dispense Refill   Ascorbic Acid (VITAMIN C PO) Take 1 tablet by mouth daily.     aspirin 81 MG chewable tablet Chew 1 tablet (81 mg total) by mouth 2 (two) times daily. 30 tablet 0   atorvastatin (LIPITOR) 40 MG tablet Take 1 tablet (40 mg total) by mouth daily. 90 tablet 3   cyanocobalamin (VITAMIN B12) 1000 MCG tablet Take 1 tablet (1,000 mcg total) by mouth daily. 90 tablet 0   diclofenac Sodium (VOLTAREN) 1 % GEL Apply 4 g topically 4 (four) times daily. 350 g 1   ezetimibe (ZETIA) 10 MG tablet Take 1 tablet (10 mg total) by mouth daily. 90 tablet 3   FARXIGA 10 MG TABS tablet Take 1 tablet by mouth once daily 90 tablet 0   gabapentin (NEURONTIN) 600 MG tablet Take 1 tablet (600 mg total) by mouth 3 (three) times daily. (Patient taking differently: Take 600 mg by mouth daily.) 90 tablet  5   HYDROcodone-acetaminophen (NORCO/VICODIN) 5-325 MG tablet Take 1 tablet by mouth every 6 (six) hours as needed for moderate pain (pain score 4-6). 30 tablet 0   HYDROmorphone (DILAUDID) 2 MG tablet Take 2 mg by mouth as needed for severe pain.     insulin aspart (FIASP FLEXTOUCH) 100 UNIT/ML FlexTouch Pen Inject 6-20 Units into the skin 3 (three) times daily before meals.  (Patient taking differently: Inject 32 Units into the skin daily.) 30 mL 3   Insulin Pen Needle 32G X 4 MM MISC Use 4x a day 300 each 3   Lancet Devices (EASY TOUCH LANCING DEVICE) MISC Use to check diabetes 4 times daily  E 11.65 1 each 11   latanoprost (XALATAN) 0.005 % ophthalmic solution Place 1 drop into both eyes at bedtime.     losartan (COZAAR) 25 MG tablet Take 1 tablet (25 mg total) by mouth every evening. 90 tablet 3   metFORMIN (GLUCOPHAGE-XR) 500 MG 24 hr tablet Take 2 tablets (1,000 mg total) by mouth daily with supper. 180 tablet 3   methylPREDNISolone (MEDROL DOSEPAK) 4 MG TBPK tablet Use po daily as directed 21 tablet 0   metoCLOPramide (REGLAN) 10 MG tablet Take 1 tablet (10 mg total) by mouth every 8 (eight) hours as needed for nausea. 30 tablet 0   Na Sulfate-K Sulfate-Mg Sulf 17.5-3.13-1.6 GM/177ML SOLN Take by mouth.     naloxone (NARCAN) nasal spray 4 mg/0.1 mL Place 1 spray into the nose as needed (or).     ondansetron (ZOFRAN-ODT) 8 MG disintegrating tablet Take 1 tablet (8 mg total) by mouth every 8 (eight) hours as needed for nausea or vomiting. 10 tablet 0   triamcinolone cream (KENALOG) 0.1 % Apply 1 Application topically daily.     Blood Glucose Monitoring Suppl (ACCU-CHEK AVIVA PLUS) w/Device KIT Use to check blood sugar 2 times a day 1 kit 0   glucose blood (BAYER CONTOUR TEST) test strip TEST BLOOD SUGARS 3 TIMES DAILY 200 each 11   glucose blood test strip Use to check blood sugar 2 times a day 100 each 12   No current facility-administered medications on file prior to visit.    Allergies  Allergen Reactions   Penicillins Rash    Social History   Socioeconomic History   Marital status: Single    Spouse name: Not on file   Number of children: 2   Years of education: 12   Highest education level: High school graduate  Occupational History   Not on file  Tobacco Use   Smoking status: Never   Smokeless tobacco: Never  Vaping Use   Vaping status:  Never Used  Substance and Sexual Activity   Alcohol use: Not Currently   Drug use: Yes    Types: Marijuana    Comment: sometimes; last use on Tuesday, 07/20/23   Sexual activity: Yes    Birth control/protection: None    Comment: same partner x 10 years  Other Topics Concern   Not on file  Social History Narrative   She works as a Arboriculturist.   Right-handed.   One cup caffeine per day.   She lives at home with daughter.   Social Drivers of Health   Financial Resource Strain: Medium Risk (08/23/2023)   Overall Financial Resource Strain (CARDIA)    Difficulty of Paying Living Expenses: Somewhat hard  Food Insecurity: No Food Insecurity (12/17/2023)   Hunger Vital Sign    Worried About Running Out of Food in the  Last Year: Never true    Ran Out of Food in the Last Year: Never true  Transportation Needs: No Transportation Needs (05/31/2023)   PRAPARE - Administrator, Civil Service (Medical): No    Lack of Transportation (Non-Medical): No  Physical Activity: Inactive (08/23/2023)   Exercise Vital Sign    Days of Exercise per Week: 0 days    Minutes of Exercise per Session: 0 min  Stress: No Stress Concern Present (08/23/2023)   [employer] of Occupational Health - Occupational Stress Questionnaire    Feeling of Stress : Not at all  Social Connections: Unknown (08/23/2023)   Social Connection and Isolation Panel [NHANES]    Frequency of Communication with Friends and Family: Patient unable to answer    Frequency of Social Gatherings with Friends and Family: More than three times a week    Attends Religious Services: More than 4 times per year    Active Member of [employer] or Organizations: Not on file    Attends Banker Meetings: More than 4 times per year    Marital Status: Not on file  Intimate Partner Violence: Not At Risk (05/31/2023)   Humiliation, Afraid, Rape, and Kick questionnaire    Fear of Current or Ex-Partner: No    Emotionally Abused: No     Physically Abused: No    Sexually Abused: No    Family History  Problem Relation Age of Onset   Hypertension Mother    Diabetes Mother    Heart attack Mother    Stroke Father    Diabetes Father    Heart attack Sister    Diabetes Maternal Grandmother    Hypertension Maternal Grandmother    Colon cancer Neg Hx    Esophageal cancer Neg Hx    Rectal cancer Neg Hx    Stomach cancer Neg Hx     Past Surgical History:  Procedure Laterality Date   CARPAL TUNNEL RELEASE Left 05/14/2014   Procedure: LEFT ULNAR NEUROPLASTY AT ELBOW AND ENDOSCOPIC CARPAL TUNNEL RELEASE;  Surgeon: Jodi Marble, MD;  Location: [facility];  Service: Orthopedics;  Laterality: Left;   CORONARY STENT INTERVENTION N/A 10/09/2019   Procedure: CORONARY STENT INTERVENTION;  Surgeon: Yates Decamp, MD;  Location: [facility];  Service: Cardiovascular;  Laterality: N/A;   CYST EXCISION  1995   under arms   endometrial biopsy  2004   benign   FOOT OSTEOTOMY  2010   right-spurs   LEFT HEART CATH AND CORONARY ANGIOGRAPHY N/A 10/09/2019   Procedure: LEFT HEART CATH AND CORONARY ANGIOGRAPHY;  Surgeon: Yates Decamp, MD;  Location: [facility];  Service: Cardiovascular;  Laterality: N/A;   TONSILLECTOMY     TOTAL KNEE ARTHROPLASTY Left 02/26/2023   Procedure: LEFT TOTAL KNEE ARTHROPLASTY;  Surgeon: Kathryne Hitch, MD;  Location: [facility];  Service: Orthopedics;  Laterality: Left;  Needs RNFA   ULNAR NERVE TRANSPOSITION Left 05/14/2014   Procedure: ULNAR NERVE DECOMPRESSION/TRANSPOSITION;  Surgeon: Jodi Marble, MD;  Location: [facility];  Service: Orthopedics;  Laterality: Left;    ROS: Review of Systems Negative except as stated above  PHYSICAL EXAM: BP 129/77   Pulse 75   Temp 98.6 F (37 C) (Oral)   Ht 5\' 3"  (1.6 m)   Wt 172 lb 3.2 oz (78.1 kg)   SpO2 97%   BMI 30.50 kg/m   Physical Exam HENT:     Head: Normocephalic and atraumatic.  Nose: Nose normal.      Mouth/Throat:     Mouth: Mucous membranes are moist.     Pharynx: Oropharynx is clear.  Eyes:     Extraocular Movements: Extraocular movements intact.     Conjunctiva/sclera: Conjunctivae normal.     Pupils: Pupils are equal, round, and reactive to light.  Cardiovascular:     Rate and Rhythm: Normal rate and regular rhythm.     Pulses: Normal pulses.     Heart sounds: Normal heart sounds.  Pulmonary:     Effort: Pulmonary effort is normal.     Breath sounds: Normal breath sounds.  Musculoskeletal:        General: Normal range of motion.     Cervical back: Normal range of motion and neck supple.  Neurological:     General: No focal deficit present.     Mental Status: She is alert and oriented to person, place, and time.  Psychiatric:        Mood and Affect: Mood normal.        Behavior: Behavior normal.     ASSESSMENT AND PLAN: 1. Arthralgia, unspecified joint (Primary) - Referral to Neurology for evaluation/management. - Ambulatory referral to Neurology  2. History of left knee surgery - Referral to Orthopedic Surgery for evaluation/management. - Ambulatory referral to Orthopedic Surgery  3. Primary hypertension - Continue Metoprolol Succinate as prescribed.  - Counseled on blood pressure goal of less than 130/80, low-sodium, DASH diet, medication compliance, and 150 minutes of moderate intensity exercise per week as tolerated. Counseled on medication adherence and adverse effects. - Follow-up with primary provider as scheduled.  - metoprolol succinate (TOPROL-XL) 50 MG 24 hr tablet; TAKE 1 TABLET BY MOUTH ONCE DAILY. TAKE  WITH  OR  IMMEDIATELY  FOLLOWING  A  MEAL  Dispense: 90 tablet; Refill: 0  4. Type 2 diabetes mellitus with hyperglycemia, with long-term current use of insulin (HCC) - Continue Insulin Degludec as prescribed. Counseled on medication adherence/adverse effects.  - Discussed the importance of healthy eating habits, low-carbohydrate diet, low-sugar  diet, regular aerobic exercise (at least 150 minutes a week as tolerated) and medication compliance to achieve or maintain control of diabetes. - Follow-up with primary provider as scheduled. - insulin degludec (TRESIBA FLEXTOUCH) 200 UNIT/ML FlexTouch Pen; INJECT 30-34 UNITS SUBCUTANEOUSLY ONCE DAILY  Dispense: 9 mL; Refill: 0  5. Pruritus - Continue Hydroxyzine as prescribed. Counseled on medication adherence/adverse effects.  - Follow-up with primary provider as scheduled. - hydrOXYzine (ATARAX) 25 MG tablet; Take 1 tablet (25 mg total) by mouth 3 (three) times daily as needed. itching  Dispense: 90 tablet; Refill: 0  6. Gastroesophageal reflux disease, unspecified whether esophagitis present - Continue Pantoprazole as prescribed. Counseled on medication adherence/adverse effects.  - Follow-up with primary provider as scheduled. - pantoprazole (PROTONIX) 40 MG tablet; Take 1 tablet (40 mg total) by mouth 2 (two) times daily.  Dispense: 180 tablet; Refill: 0  7. Muscle spasm - Continue Tizanidine as prescribed. Counseled on medication adherence/adverse effects.  - Follow-up with primary provider as scheduled. - tiZANidine (ZANAFLEX) 4 MG tablet; Take 1 tablet (4 mg total) by mouth every 6 (six) hours as needed for muscle spasms.  Dispense: 90 tablet; Refill: 0    Patient was given the opportunity to ask questions.  Patient verbalized understanding of the plan and was able to repeat key elements of the plan. Patient was given clear instructions to go to Emergency Department or return to medical center if symptoms don't  improve, worsen, or new problems develop.The patient verbalized understanding.   Orders Placed This Encounter  Procedures   Ambulatory referral to Orthopedic Surgery   Ambulatory referral to Neurology     Requested Prescriptions   Signed Prescriptions Disp Refills   hydrOXYzine (ATARAX) 25 MG tablet 90 tablet 0    Sig: Take 1 tablet (25 mg total) by mouth 3 (three)  times daily as needed. itching   insulin degludec (TRESIBA FLEXTOUCH) 200 UNIT/ML FlexTouch Pen 9 mL 0    Sig: INJECT 30-34 UNITS SUBCUTANEOUSLY ONCE DAILY   metoprolol succinate (TOPROL-XL) 50 MG 24 hr tablet 90 tablet 0    Sig: TAKE 1 TABLET BY MOUTH ONCE DAILY. TAKE  WITH  OR  IMMEDIATELY  FOLLOWING  A  MEAL   pantoprazole (PROTONIX) 40 MG tablet 180 tablet 0    Sig: Take 1 tablet (40 mg total) by mouth 2 (two) times daily.   tiZANidine (ZANAFLEX) 4 MG tablet 90 tablet 0    Sig: Take 1 tablet (4 mg total) by mouth every 6 (six) hours as needed for muscle spasms.    Return in about 2 weeks (around 01/14/2024) for Follow-Up or next available with Georganna Skeans, MD .  Rema Fendt, NP

## 2024-01-04 ENCOUNTER — Ambulatory Visit (INDEPENDENT_AMBULATORY_CARE_PROVIDER_SITE_OTHER): Payer: 59 | Admitting: Family Medicine

## 2024-01-04 ENCOUNTER — Encounter: Payer: Self-pay | Admitting: Family Medicine

## 2024-01-04 VITALS — BP 138/79 | HR 86 | Temp 98.7°F | Resp 16 | Ht 64.0 in | Wt 168.2 lb

## 2024-01-04 DIAGNOSIS — Z9889 Other specified postprocedural states: Secondary | ICD-10-CM

## 2024-01-04 DIAGNOSIS — M25562 Pain in left knee: Secondary | ICD-10-CM | POA: Diagnosis not present

## 2024-01-04 DIAGNOSIS — Z0289 Encounter for other administrative examinations: Secondary | ICD-10-CM | POA: Diagnosis not present

## 2024-01-04 DIAGNOSIS — G8929 Other chronic pain: Secondary | ICD-10-CM

## 2024-01-06 ENCOUNTER — Encounter: Payer: Self-pay | Admitting: Family Medicine

## 2024-01-06 NOTE — Progress Notes (Signed)
 Established Patient Office Visit  Subjective    Patient ID: [name], female    DOB: 29-Jan-1961  Age: 63 y.o. MRN: [id number]  CC:  Chief Complaint  Patient presents with   discuss paperwork    Body pain, ear pain    HPI [name] presents for completion of form regarding her left knee pain. She had recent surgery. 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 She is having to now take early retirement.   Outpatient Encounter Medications as of 01/04/2024  Medication Sig   Ascorbic Acid (VITAMIN C PO) Take 1 tablet by mouth daily.   aspirin  81 MG chewable tablet Chew 1 tablet (81 mg total) by mouth 2 (two) times daily.   atorvastatin  (LIPITOR) 40 MG tablet Take 1 tablet (40 mg total) by mouth daily.   Blood Glucose Monitoring Suppl (ACCU-CHEK AVIVA PLUS) w/Device KIT Use to check blood sugar 2 times a day   cyanocobalamin  (VITAMIN B12) 1000 MCG tablet Take 1 tablet (1,000 mcg total) by mouth daily.   diclofenac  Sodium (VOLTAREN ) 1 % GEL Apply 4 g topically 4 (four) times daily.   ezetimibe  (ZETIA ) 10 MG tablet Take 1 tablet (10 mg total) by mouth daily.   FARXIGA  10 MG TABS tablet Take 1 tablet by mouth once daily   gabapentin  (NEURONTIN ) 600 MG tablet Take 1 tablet (600 mg total) by mouth 3 (three) times daily. (Patient taking differently: Take 600 mg by mouth daily.)   glucose blood (BAYER CONTOUR TEST) test strip TEST BLOOD SUGARS 3 TIMES DAILY   glucose blood test strip Use to check blood sugar 2 times a day   HYDROcodone -acetaminophen  (NORCO/VICODIN) 5-325 MG tablet Take 1 tablet by mouth every 6 (six) hours as needed for moderate pain (pain score 4-6).   HYDROmorphone  (DILAUDID ) 2 MG tablet Take 2 mg by mouth as needed for severe pain.   hydrOXYzine  (ATARAX ) 25 MG tablet Take 1 tablet (25 mg total) by mouth 3 (three) times daily as needed. itching   insulin  aspart (FIASP  FLEXTOUCH) 100 UNIT/ML FlexTouch Pen Inject 6-20 Units into the skin 3 (three)  times daily before meals. (Patient taking differently: Inject 32 Units into the skin daily.)   insulin  degludec (TRESIBA  FLEXTOUCH) 200 UNIT/ML FlexTouch Pen INJECT 30-34 UNITS SUBCUTANEOUSLY ONCE DAILY   Insulin  Pen Needle 32G X 4 MM MISC Use 4x a day   Lancet Devices (EASY TOUCH LANCING DEVICE) MISC Use to check diabetes 4 times daily  E 11.65   latanoprost  (XALATAN ) 0.005 % ophthalmic solution Place 1 drop into both eyes at bedtime.   losartan  (COZAAR ) 25 MG tablet Take 1 tablet (25 mg total) by mouth every evening.   metFORMIN  (GLUCOPHAGE -XR) 500 MG 24 hr tablet Take 2 tablets (1,000 mg total) by mouth daily with supper.   methylPREDNISolone  (MEDROL  DOSEPAK) 4 MG TBPK tablet Use po daily as directed   metoCLOPramide  (REGLAN ) 10 MG tablet Take 1 tablet (10 mg total) by mouth every 8 (eight) hours as needed for nausea.   metoprolol  succinate (TOPROL -XL) 50 MG 24 hr tablet TAKE 1 TABLET BY MOUTH ONCE DAILY. TAKE  WITH  OR  IMMEDIATELY  FOLLOWING  A  MEAL   Na Sulfate-K Sulfate-Mg Sulf 17.5-3.13-1.6 GM/177ML SOLN Take by mouth.   naloxone  (NARCAN ) nasal spray 4 mg/0.1 mL Place 1 spray into the nose as needed (or).   ondansetron  (ZOFRAN -ODT) 8 MG disintegrating tablet Take 1 tablet (8 mg total) by mouth  every 8 (eight) hours as needed for nausea or vomiting.   pantoprazole  (PROTONIX ) 40 MG tablet Take 1 tablet (40 mg total) by mouth 2 (two) times daily.   tiZANidine  (ZANAFLEX ) 4 MG tablet Take 1 tablet (4 mg total) by mouth every 6 (six) hours as needed for muscle spasms.   triamcinolone  cream (KENALOG ) 0.1 % Apply 1 Application topically daily.   No facility-administered encounter medications on file as of 01/04/2024.    Past Medical History:  Diagnosis Date   Arthritis    Campylobacter enteritis 03/2011   [facility] admission   Carpal tunnel syndrome, bilateral    Diabetes mellitus    Gastritis with bleeding due to alcohol 2010   [facility] admission   GERD (gastroesophageal reflux disease)     HIDRADENITIS SUPPURATIVA 04/11/2007   Qualifier: Diagnosis of   By: Celinda MD, Erle Clunes        Hyperlipidemia    Hypertension    no meds   Menopause    Myocardial infarction [facility])    Neck pain    Wears glasses     Past Surgical History:  Procedure Laterality Date   CARPAL TUNNEL RELEASE Left 05/14/2014   Procedure: LEFT ULNAR NEUROPLASTY AT ELBOW AND ENDOSCOPIC CARPAL TUNNEL RELEASE;  Surgeon: Alm DELENA Hummer, MD;  Location: [facility];  Service: Orthopedics;  Laterality: Left;   CORONARY STENT INTERVENTION N/A 10/09/2019   Procedure: CORONARY STENT INTERVENTION;  Surgeon: Ladona Heinz, MD;  Location: [facility];  Service: Cardiovascular;  Laterality: N/A;   CYST EXCISION  1995   under arms   endometrial biopsy  2004   benign   FOOT OSTEOTOMY  2010   right-spurs   LEFT HEART CATH AND CORONARY ANGIOGRAPHY N/A 10/09/2019   Procedure: LEFT HEART CATH AND CORONARY ANGIOGRAPHY;  Surgeon: Ladona Heinz, MD;  Location: [facility];  Service: Cardiovascular;  Laterality: N/A;   TONSILLECTOMY     TOTAL KNEE ARTHROPLASTY Left 02/26/2023   Procedure: LEFT TOTAL KNEE ARTHROPLASTY;  Surgeon: Vernetta Lonni GRADE, MD;  Location: [facility];  Service: Orthopedics;  Laterality: Left;  Needs RNFA   ULNAR NERVE TRANSPOSITION Left 05/14/2014   Procedure: ULNAR NERVE DECOMPRESSION/TRANSPOSITION;  Surgeon: Alm DELENA Hummer, MD;  Location: [facility];  Service: Orthopedics;  Laterality: Left;    Family History  Problem Relation Age of Onset   Hypertension Mother    Diabetes Mother    Heart attack Mother    Stroke Father    Diabetes Father    Heart attack Sister    Diabetes Maternal Grandmother    Hypertension Maternal Grandmother    Colon cancer Neg Hx    Esophageal cancer Neg Hx    Rectal cancer Neg Hx    Stomach cancer Neg Hx     Social History   Socioeconomic History   Marital status: Single    Spouse name: Not on file   Number of children: 2    Years of education: 12   Highest education level: High school graduate  Occupational History   Not on file  Tobacco Use   Smoking status: Never   Smokeless tobacco: Never  Vaping Use   Vaping status: Never Used  Substance and Sexual Activity   Alcohol use: Not Currently   Drug use: Yes    Types: Marijuana    Comment: sometimes; last use on Tuesday, 07/20/23   Sexual activity: Yes    Birth control/protection: None    Comment: same partner  x 10 years  Other Topics Concern   Not on file  Social History Narrative   She works as a arboriculturist.   Right-handed.   One cup caffeine  per day.   She lives at home with daughter.   Social Drivers of Health   Financial Resource Strain: Medium Risk (08/23/2023)   Overall Financial Resource Strain (CARDIA)    Difficulty of Paying Living Expenses: Somewhat hard  Food Insecurity: No Food Insecurity (12/17/2023)   Hunger Vital Sign    Worried About Running Out of Food in the Last Year: Never true    Ran Out of Food in the Last Year: Never true  Transportation Needs: No Transportation Needs (05/31/2023)   PRAPARE - Administrator, Civil Service (Medical): No    Lack of Transportation (Non-Medical): No  Physical Activity: Inactive (08/23/2023)   Exercise Vital Sign    Days of Exercise per Week: 0 days    Minutes of Exercise per Session: 0 min  Stress: No Stress Concern Present (08/23/2023)   [employer] of Occupational Health - Occupational Stress Questionnaire    Feeling of Stress : Not at all  Social Connections: Unknown (08/23/2023)   Social Connection and Isolation Panel [NHANES]    Frequency of Communication with Friends and Family: Patient unable to answer    Frequency of Social Gatherings with Friends and Family: More than three times a week    Attends Religious Services: More than 4 times per year    Active Member of [employer] or Organizations: Not on file    Attends Banker Meetings: More than 4 times per year     Marital Status: Not on file  Intimate Partner Violence: Not At Risk (05/31/2023)   Humiliation, Afraid, Rape, and Kick questionnaire    Fear of Current or Ex-Partner: No    Emotionally Abused: No    Physically Abused: No    Sexually Abused: No    Review of Systems  All other systems reviewed and are negative.       Objective    BP 138/79 (BP Location: Right Arm, Patient Position: Sitting, Cuff Size: Normal)   Pulse 86   Temp 98.7 F (37.1 C) (Oral)   Resp 16   Ht 5' 4 (1.626 m)   Wt 168 lb 3.2 oz (76.3 kg)   SpO2 98%   BMI 28.87 kg/m   Physical Exam Vitals and nursing note reviewed.  Constitutional:      General: She is not in acute distress. Cardiovascular:     Rate and Rhythm: Normal rate and regular rhythm.  Pulmonary:     Effort: Pulmonary effort is normal.     Breath sounds: Normal breath sounds.  Musculoskeletal:     Comments: Utilizing cane for stability  Neurological:     General: No focal deficit present.     Mental Status: She is alert and oriented to person, place, and time.         Assessment & Plan:   Encounter for completion of form with patient  Chronic pain of left knee  History of left knee surgery     Return if symptoms worsen or fail to improve.   Tanda Raguel SQUIBB, MD

## 2024-01-10 ENCOUNTER — Encounter: Payer: Self-pay | Admitting: Family Medicine

## 2024-01-13 ENCOUNTER — Ambulatory Visit: Payer: 59 | Attending: Internal Medicine | Admitting: Internal Medicine

## 2024-01-13 ENCOUNTER — Encounter: Payer: Self-pay | Admitting: Internal Medicine

## 2024-01-13 VITALS — BP 126/83 | HR 80 | Resp 16 | Ht 64.25 in | Wt 175.0 lb

## 2024-01-13 DIAGNOSIS — M2550 Pain in unspecified joint: Secondary | ICD-10-CM | POA: Diagnosis not present

## 2024-01-13 DIAGNOSIS — M7918 Myalgia, other site: Secondary | ICD-10-CM | POA: Insufficient documentation

## 2024-01-13 DIAGNOSIS — E1142 Type 2 diabetes mellitus with diabetic polyneuropathy: Secondary | ICD-10-CM | POA: Diagnosis not present

## 2024-01-13 DIAGNOSIS — G5603 Carpal tunnel syndrome, bilateral upper limbs: Secondary | ICD-10-CM

## 2024-01-13 NOTE — Progress Notes (Unsigned)
Office Visit Note  Patient: [name]             Date of Birth: [birth year]           MRN: [id number]             PCP: Georganna Skeans, MD Referring: Georganna Skeans, MD Visit Date: 01/13/2024 Occupation: Custodial work  Subjective:  New Patient (Initial Visit) (Patient states she has joint and muscle pain all over. Patient states she has bad cramps. Patient states her whole body is aching. )   Discussed the use of AI scribe software for clinical note transcription with the patient, who gave verbal consent to proceed.  History of Present Illness   [name] is a 63 year old female with chronic joint pain who presents with increased joint pain and stiffness of multiple areas.  She has experienced a long-standing history of joint pain and stiffness, which has worsened over the past two to three months. The pain is continuous and gradually increasing, affecting her knees, back, and other joints.  She had left knee joint replacement surgery in March last year with Dr. Magnus Ivan.  Despite undergoing left knee replacement surgery, she continues to experience pain and swelling in the operated knee. Financial constraints have limited her ability to pursue physical therapy, but she reports adhering to HEP and engages in water exercises at the gym to manage her symptoms.  She experiences numbness and neuropathy in her shoulders, hands, knees, and feet, which she attributes to diabetes. She has not seen a neurologist recently for these symptoms.  She takes gabapentin currently 600 mg once daily for neuropathy symptoms.  She is also prescribed tizanidine reports use of topical Voltaren but only a small benefit.  She experiences a trigger finger in her left third finger, which occasionally gets stuck. She also notes the presence of bony nodules on her fingers. 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  She reports difficulty sleeping, waking up approximately three times a night to urinate.  Does have fatigue throughout the day.  She has a history of CAD with NSTEMI in 10/2019 with stenting to mid right and distal LAD.  She was a longstanding former smoker quit 2013.  She also has a previous history of carpal tunnel release surgery and ulnar nerve transposition surgery in 2015 with Dr. Janee Morn.  Labs reviewed 05/2023 ESR 5 CRP 0.7   Activities of Daily Living:  Patient reports morning stiffness for 24 hours.   Patient Reports nocturnal pain.  Difficulty dressing/grooming: Reports Difficulty climbing stairs: Reports Difficulty getting out of chair: Reports Difficulty using hands for taps, buttons, cutlery, and/or writing: Reports  Review of Systems  Constitutional:  Negative for fatigue.  HENT:  Positive for mouth dryness. Negative for mouth sores.   Eyes:  Negative for dryness.  Respiratory:  Negative for shortness of breath.   Cardiovascular:  Negative for chest pain and palpitations.  Gastrointestinal:  Positive for constipation. Negative for blood in stool and diarrhea.  Endocrine: Positive for increased urination.  Genitourinary:  Positive for involuntary urination.  Musculoskeletal:  Positive for joint pain, gait problem, joint pain, joint swelling, myalgias, muscle weakness, morning stiffness, muscle tenderness and myalgias.  Skin:  Negative for color change, rash, hair loss and sensitivity to sunlight.  Allergic/Immunologic: Positive for susceptible to infections.  Neurological:  Negative for dizziness  and headaches.  Hematological:  Negative for swollen glands.  Psychiatric/Behavioral:  Positive for sleep disturbance. Negative for depressed mood. The patient is not nervous/anxious.     PMFS History:  Patient Active Problem List   Diagnosis Date Noted   Myofascial pain 01/13/2024   Mixed hyperlipidemia due to type 2 diabetes  mellitus (HCC) 06/02/2023   Marijuana use, continuous 06/02/2023   Diabetic peripheral neuropathy associated with type 2 diabetes mellitus (HCC) 06/02/2023   Intractable vomiting with nausea 06/02/2023   Arthrofibrosis of total knee arthroplasty, subsequent encounter 04/07/2023   Status post left knee replacement 02/26/2023   Unilateral primary osteoarthritis, left knee 09/09/2022   Intractable nausea and vomiting 07/04/2022   Hypokalemia 07/04/2022   Pain due to onychomycosis of toenails of both feet 04/28/2022   Diabetic neuropathy, type II diabetes mellitus (HCC) 04/28/2022   De Quervain's tenosynovitis, left 11/13/2021   CAD (coronary artery disease) 10/10/2019   Obesity (BMI 30.0-34.9) 10/09/2019   Cervical spondylitis with radiculitis (HCC) 08/29/2019   Chronic midline posterior neck pain 06/28/2018   Urgency incontinence 06/28/2018   Degenerative joint disease of knee, left 04/26/2018   Carpal tunnel syndrome on both sides 03/29/2018   Anemia 01/02/2018   Class 1 obesity with serious comorbidity and body mass index (BMI) of 32.0 to 32.9 in adult 07/23/2017   Left hip pain 05/04/2017   Polyarthralgia 11/09/2016   Uncontrolled type 2 diabetes mellitus with hyperglycemia, with long-term current use of insulin (HCC) 07/07/2016   Visit for preventive health examination 06/21/2016   Chronic meniscal tear of knee 08/19/2015   Left knee pain 06/19/2015   Genital herpes 07/31/2014   Ulnar nerve compression 09/21/2013   Noncompliance with diet and medication regimen 11/15/2012   Arthritis    Screening for breast cancer 08/04/2011   Screening for colon cancer 08/04/2011   GERD without esophagitis 11/17/2006   ENDOMETRIAL POLYP 11/17/2006   LOW BACK PAIN 11/17/2006   FIBROIDS, UTERUS 11/16/2006   Hyperlipidemia 11/16/2006   Essential hypertension 11/16/2006    Past Medical History:  Diagnosis Date   Arthritis    Campylobacter enteritis 03/2011   [facility] admission   Carpal tunnel  syndrome, bilateral    Diabetes mellitus    Gastritis with bleeding due to alcohol 2010   [facility] admission   GERD (gastroesophageal reflux disease)    HIDRADENITIS SUPPURATIVA 04/11/2007   Qualifier: Diagnosis of   By: Robb Matar MD, Lambert Keto        Hyperlipidemia    Hypertension    no meds   Menopause    Myocardial infarction [facility])    Neck pain    Wears glasses     Family History  Problem Relation Age of Onset   Hypertension Mother    Diabetes Mother    Heart attack Mother    Stroke Father    Diabetes Father    Heart attack Sister    Diabetes Maternal Grandmother    Hypertension Maternal Grandmother    Colon cancer Neg Hx    Esophageal cancer Neg Hx    Rectal cancer Neg Hx    Stomach cancer Neg Hx    Past Surgical History:  Procedure Laterality Date   CARPAL TUNNEL RELEASE Left 05/14/2014   Procedure: LEFT ULNAR NEUROPLASTY AT ELBOW AND ENDOSCOPIC CARPAL TUNNEL RELEASE;  Surgeon: Jodi Marble, MD;  Location: [facility];  Service: Orthopedics;  Laterality: Left;   CORONARY STENT INTERVENTION N/A 10/09/2019   Procedure: CORONARY STENT INTERVENTION;  Surgeon: Jacinto Halim,  Vonna Kotyk, MD;  [facility];  Service: Cardiovascular;  Laterality: N/A;   CYST EXCISION  1995   under arms   endometrial biopsy  2004   benign   FOOT OSTEOTOMY  2010   right-spurs   LEFT HEART CATH AND CORONARY ANGIOGRAPHY N/A 10/09/2019   Procedure: LEFT HEART CATH AND CORONARY ANGIOGRAPHY;  Surgeon: Yates Decamp, MD;  [facility];  Service: Cardiovascular;  Laterality: N/A;   TONSILLECTOMY     TOTAL KNEE ARTHROPLASTY Left 02/26/2023   Procedure: LEFT TOTAL KNEE ARTHROPLASTY;  Surgeon: Kathryne Hitch, MD;  Location: [facility];  Service: Orthopedics;  Laterality: Left;  Needs RNFA   ULNAR NERVE TRANSPOSITION Left 05/14/2014   Procedure: ULNAR NERVE DECOMPRESSION/TRANSPOSITION;  Surgeon: Jodi Marble, MD;  Location: [facility];  Service:  Orthopedics;  Laterality: Left;   Social History   Social History Narrative   She works as a Arboriculturist.   Right-handed.   One cup caffeine per day.   She lives at home with daughter.   Immunization History  Administered Date(s) Administered   Influenza Split 11/03/2011   Influenza Whole 10/06/2005, 09/06/2006   Influenza,inj,Quad PF,6+ Mos 10/04/2014, 09/19/2015, 07/22/2016, 09/24/2017, 09/13/2020   Influenza,inj,Quad PF,6-35 Mos 09/09/2022   Influenza-Unspecified 09/09/2013   Pneumococcal Conjugate-13 03/04/2015   Td 06/06/2012   Tdap 11/03/2011     Objective: Vital Signs: BP 126/83 (BP Location: Right Arm, Patient Position: Sitting, Cuff Size: Normal)   Pulse 80   Resp 16   Ht 5' 4.25" (1.632 m)   Wt 175 lb (79.4 kg)   BMI 29.81 kg/m    Physical Exam Constitutional:      Appearance: She is obese.  Eyes:     Conjunctiva/sclera: Conjunctivae normal.  Cardiovascular:     Rate and Rhythm: Normal rate and regular rhythm.  Pulmonary:     Effort: Pulmonary effort is normal.     Breath sounds: Normal breath sounds.  Musculoskeletal:     Right lower leg: No edema.     Left lower leg: No edema.  Lymphadenopathy:     Cervical: No cervical adenopathy.  Skin:    General: Skin is warm and dry.     Findings: No rash.  Neurological:     Mental Status: She is alert.  Psychiatric:        Mood and Affect: Mood normal.      Musculoskeletal Exam:  Widespread tenderness to pressure throughout the neck, shoulders, and throughout upper and lower back involving muscular areas not well localized to the joints, does have pain radiation up and down from tender points in the back Full range of motion in both wrists and hands, distal Heberden's nodes present, digital cyst on dorsum of right third DIP, there is faint crepitus and triggering at the left third finger does not require assistance for full range of motion There is tenderness to pressure throughout the left lateral hip and  thigh extending down to the calf Knees full ROM, well-healed surgical incision site, no palpable effusion Ankles full ROM no tenderness or swelling Mild first MTP nodule without severe lateral deviation, no tenderness to MTP squeeze pressure   Investigation: No additional findings.  Imaging: XR Knee 1-2 Views Left Result Date: 12/20/2023 Left knee 2 views: Well-seated left total knee arthroplasty without any complicating features.  No acute fractures acute findings.   Recent Labs: Lab Results  Component Value Date   WBC 7.5 06/02/2023   HGB 12.8 06/02/2023  PLT 321 06/02/2023   NA 136 06/02/2023   K 3.7 06/02/2023   CL 99 06/02/2023   CO2 27 06/02/2023   GLUCOSE 140 (H) 06/02/2023   BUN 14 06/02/2023   CREATININE 0.56 06/02/2023   BILITOT 0.9 06/02/2023   ALKPHOS 78 06/02/2023   AST 17 06/02/2023   ALT 19 06/02/2023   PROT 6.4 (L) 06/02/2023   ALBUMIN 3.6 06/02/2023   CALCIUM 8.8 (L) 06/02/2023   GFRAA 111 01/24/2021    Speciality Comments: No specialty comments available.  Procedures:  No procedures performed Allergies: Penicillins   Assessment / Plan:     Visit Diagnoses: Polyarthralgia - Plan: Sedimentation rate, Rheumatoid factor, Cyclic citrul peptide antibody, IgG, CK Longstanding joint pain and stiffness, with recent increase in severity. History of knee replacement surgery. Noted myofascial pain and possible osteoarthritis.  Clinically I do not see any peripheral joint synovitis and then less suspicious for inflammatory arthritis process but will check labs today to rule out secondary process given how persistent and widespread symptoms are. -Checking sed rate, CK, RF and CCP antibodies  Trigger Finger Noted in left third finger.  Not severely painful or limiting function. -Monitor and manage symptomatically  Diabetic peripheral neuropathy associated with type 2 diabetes mellitus (HCC) Reports of constant numbness affecting various parts of the body,  likely secondary to diabetes. -Consider referral to a neurologist if symptoms persist or worsen.  Myofascial pain Widespread tenderness to pressure not well localized to the joints does have radiating pain from trigger points and increased muscle tone at rest.  Deftly think there is a component of chronic myofascial pain superimposed with her osteoarthritis.  Does not have other severe neuro or GI complaints for fibromyalgia syndrome though there may be a component hard to rule out in setting of peripheral neuropathy. -Provided information reviewed on fibromyalgia patient symptom management guide -Agree with some current treatments including gabapentin, tizanidine -May also benefit additional follow-up to PT and continue to increase low intensity exercises to improve stiffness and mobility -Can consider other neuropathic pain medications  Orders: Orders Placed This Encounter  Procedures   Sedimentation rate   Rheumatoid factor   Cyclic citrul peptide antibody, IgG   CK   No orders of the defined types were placed in this encounter.  Follow-Up Instructions: No follow-ups on file.   Fuller Plan, MD  Note - This record has been created using [employer].  Chart creation errors have been sought, but may not always  have been located. Such creation errors do not reflect on  the standard of medical care.

## 2024-01-13 NOTE — Patient Instructions (Signed)
I recommend checking out the [facility] [place] patient-centered guide for fibromyalgia and chronic pain management: [email or web address]

## 2024-01-14 ENCOUNTER — Ambulatory Visit (INDEPENDENT_AMBULATORY_CARE_PROVIDER_SITE_OTHER): Payer: 59 | Admitting: Family Medicine

## 2024-01-14 ENCOUNTER — Encounter: Payer: Self-pay | Admitting: Family Medicine

## 2024-01-14 VITALS — BP 136/84 | HR 86 | Temp 98.0°F | Resp 16 | Ht 64.0 in | Wt 170.2 lb

## 2024-01-14 DIAGNOSIS — M2550 Pain in unspecified joint: Secondary | ICD-10-CM

## 2024-01-14 DIAGNOSIS — I10 Essential (primary) hypertension: Secondary | ICD-10-CM

## 2024-01-16 LAB — CYCLIC CITRUL PEPTIDE ANTIBODY, IGG: Cyclic Citrullin Peptide Ab: <16 U

## 2024-01-16 LAB — SEDIMENTATION RATE: Sed Rate: 6 mm/h (ref 0–30)

## 2024-01-16 LAB — RHEUMATOID FACTOR: Rheumatoid fact SerPl-aCnc: <10 [IU]/mL (ref <14)

## 2024-01-16 LAB — CK: Total CK: 84 U/L (ref 20–243)

## 2024-01-19 ENCOUNTER — Encounter: Payer: Self-pay | Admitting: Family Medicine

## 2024-01-19 ENCOUNTER — Ambulatory Visit (HOSPITAL_COMMUNITY): Payer: 59

## 2024-01-19 ENCOUNTER — Ambulatory Visit: Payer: 59 | Admitting: Orthopaedic Surgery

## 2024-01-19 NOTE — Progress Notes (Signed)
Established Patient Office Visit  Subjective    Patient ID: [name], female    DOB: September 24, 1961  Age: 63 y.o. MRN: [id number]  CC:  Chief Complaint  Patient presents with   Follow-up    2 week    HPI [name] presents for routine follow up of joint pain and hypertension. Patient reports med compliance and denies acute complaints.   Outpatient Encounter Medications as of 01/14/2024  Medication Sig   aspirin 81 MG chewable tablet Chew 1 tablet (81 mg total) by mouth 2 (two) times daily.   atorvastatin (LIPITOR) 40 MG tablet Take 1 tablet (40 mg total) by mouth daily.   Blood Glucose Monitoring Suppl (ACCU-CHEK AVIVA PLUS) w/Device KIT Use to check blood sugar 2 times a day   cyanocobalamin (VITAMIN B12) 1000 MCG tablet Take 1 tablet (1,000 mcg total) by mouth daily.   diclofenac Sodium (VOLTAREN) 1 % GEL Apply 4 g topically 4 (four) times daily.   ezetimibe (ZETIA) 10 MG tablet Take 1 tablet (10 mg total) by mouth daily.   FARXIGA 10 MG TABS tablet Take 1 tablet by mouth once daily   gabapentin (NEURONTIN) 600 MG tablet Take 1 tablet (600 mg total) by mouth 3 (three) times daily. (Patient taking differently: Take 600 mg by mouth daily.)   glucose blood (BAYER CONTOUR TEST) test strip TEST BLOOD SUGARS 3 TIMES DAILY   glucose blood test strip Use to check blood sugar 2 times a day   HYDROcodone-acetaminophen (NORCO/VICODIN) 5-325 MG tablet Take 1 tablet by mouth every 6 (six) hours as needed for moderate pain (pain score 4-6).   HYDROmorphone (DILAUDID) 2 MG tablet Take 2 mg by mouth as needed for severe pain.   hydrOXYzine (ATARAX) 25 MG tablet Take 1 tablet (25 mg total) by mouth 3 (three) times daily as needed. itching   insulin aspart (FIASP FLEXTOUCH) 100 UNIT/ML FlexTouch Pen Inject 6-20 Units into the skin 3 (three) times daily before meals. (Patient taking differently: Inject 32 Units into the skin daily.)   insulin degludec (TRESIBA FLEXTOUCH) 200 UNIT/ML FlexTouch  Pen INJECT 30-34 UNITS SUBCUTANEOUSLY ONCE DAILY   Insulin Pen Needle 32G X 4 MM MISC Use 4x a day   Lancet Devices (EASY TOUCH LANCING DEVICE) MISC Use to check diabetes 4 times daily  E 11.65   latanoprost (XALATAN) 0.005 % ophthalmic solution Place 1 drop into both eyes at bedtime.   losartan (COZAAR) 25 MG tablet Take 1 tablet (25 mg total) by mouth every evening.   metFORMIN (GLUCOPHAGE-XR) 500 MG 24 hr tablet Take 2 tablets (1,000 mg total) by mouth daily with supper.   methylPREDNISolone (MEDROL DOSEPAK) 4 MG TBPK tablet Use po daily as directed   metoCLOPramide (REGLAN) 10 MG tablet Take 1 tablet (10 mg total) by mouth every 8 (eight) hours as needed for nausea.   metoprolol succinate (TOPROL-XL) 50 MG 24 hr tablet TAKE 1 TABLET BY MOUTH ONCE DAILY. TAKE  WITH  OR  IMMEDIATELY  FOLLOWING  A  MEAL   naloxone (NARCAN) nasal spray 4 mg/0.1 mL Place 1 spray into the nose as needed (or).   ondansetron (ZOFRAN-ODT) 8 MG disintegrating tablet Take 1 tablet (8 mg total) by mouth every 8 (eight) hours as needed for nausea or vomiting.   pantoprazole (PROTONIX) 40 MG tablet Take 1 tablet (40 mg total) by mouth 2 (two) times daily.   tiZANidine (ZANAFLEX) 4 MG tablet Take 1 tablet (4 mg total) by mouth every  6 (six) hours as needed for muscle spasms.   triamcinolone cream (KENALOG) 0.1 % Apply 1 Application topically daily.   Ascorbic Acid (VITAMIN C PO) Take 1 tablet by mouth daily. (Patient not taking: Reported on 01/13/2024)   Na Sulfate-K Sulfate-Mg Sulf 17.5-3.13-1.6 GM/177ML SOLN Take by mouth. (Patient not taking: Reported on 01/13/2024)   No facility-administered encounter medications on file as of 01/14/2024.    Past Medical History:  Diagnosis Date   Arthritis    Campylobacter enteritis 03/2011   [facility] admission   Carpal tunnel syndrome, bilateral    Diabetes mellitus    Gastritis with bleeding due to alcohol 2010   [facility] admission   GERD (gastroesophageal reflux disease)     HIDRADENITIS SUPPURATIVA 04/11/2007   Qualifier: Diagnosis of   By: Robb Matar MD, Lambert Keto        Hyperlipidemia    Hypertension    no meds   Menopause    Myocardial infarction [facility])    Neck pain    Wears glasses     Past Surgical History:  Procedure Laterality Date   CARPAL TUNNEL RELEASE Left 05/14/2014   Procedure: LEFT ULNAR NEUROPLASTY AT ELBOW AND ENDOSCOPIC CARPAL TUNNEL RELEASE;  Surgeon: Jodi Marble, MD;  Location: [facility];  Service: Orthopedics;  Laterality: Left;   CORONARY STENT INTERVENTION N/A 10/09/2019   Procedure: CORONARY STENT INTERVENTION;  Surgeon: Yates Decamp, MD;  [facility];  Service: Cardiovascular;  Laterality: N/A;   CYST EXCISION  1995   under arms   endometrial biopsy  2004   benign   FOOT OSTEOTOMY  2010   right-spurs   LEFT HEART CATH AND CORONARY ANGIOGRAPHY N/A 10/09/2019   Procedure: LEFT HEART CATH AND CORONARY ANGIOGRAPHY;  Surgeon: Yates Decamp, MD;  Location: [facility];  Service: Cardiovascular;  Laterality: N/A;   TONSILLECTOMY     TOTAL KNEE ARTHROPLASTY Left 02/26/2023   Procedure: LEFT TOTAL KNEE ARTHROPLASTY;  Surgeon: Kathryne Hitch, MD;  Location: [facility];  Service: Orthopedics;  Laterality: Left;  Needs RNFA   ULNAR NERVE TRANSPOSITION Left 05/14/2014   Procedure: ULNAR NERVE DECOMPRESSION/TRANSPOSITION;  Surgeon: Jodi Marble, MD;  Location: [facility];  Service: Orthopedics;  Laterality: Left;    Family History  Problem Relation Age of Onset   Hypertension Mother    Diabetes Mother    Heart attack Mother    Stroke Father    Diabetes Father    Heart attack Sister    Diabetes Maternal Grandmother    Hypertension Maternal Grandmother    Colon cancer Neg Hx    Esophageal cancer Neg Hx    Rectal cancer Neg Hx    Stomach cancer Neg Hx     Social History   Socioeconomic History   Marital status: Single    Spouse name: Not on file   Number of children: 2    Years of education: 12   Highest education level: High school graduate  Occupational History   Not on file  Tobacco Use   Smoking status: Never    Passive exposure: Past   Smokeless tobacco: Never  Vaping Use   Vaping status: Never Used  Substance and Sexual Activity   Alcohol use: Not Currently   Drug use: Yes    Types: Marijuana    Comment: sometimes; last use on Tuesday, 07/20/23   Sexual activity: Yes    Birth control/protection: None    Comment: same partner x 10  years  Other Topics Concern   Not on file  Social History Narrative   She works as a Arboriculturist.   Right-handed.   One cup caffeine per day.   She lives at home with daughter.   Social Drivers of Health   Financial Resource Strain: Medium Risk (08/23/2023)   Overall Financial Resource Strain (CARDIA)    Difficulty of Paying Living Expenses: Somewhat hard  Food Insecurity: No Food Insecurity (12/17/2023)   Hunger Vital Sign    Worried About Running Out of Food in the Last Year: Never true    Ran Out of Food in the Last Year: Never true  Transportation Needs: No Transportation Needs (05/31/2023)   PRAPARE - Administrator, Civil Service (Medical): No    Lack of Transportation (Non-Medical): No  Physical Activity: Inactive (08/23/2023)   Exercise Vital Sign    Days of Exercise per Week: 0 days    Minutes of Exercise per Session: 0 min  Stress: No Stress Concern Present (08/23/2023)   [employer] of Occupational Health - Occupational Stress Questionnaire    Feeling of Stress : Not at all  Social Connections: Unknown (08/23/2023)   Social Connection and Isolation Panel [NHANES]    Frequency of Communication with Friends and Family: Patient unable to answer    Frequency of Social Gatherings with Friends and Family: More than three times a week    Attends Religious Services: More than 4 times per year    Active Member of [employer] or Organizations: Not on file    Attends Banker Meetings:  More than 4 times per year    Marital Status: Not on file  Intimate Partner Violence: Not At Risk (05/31/2023)   Humiliation, Afraid, Rape, and Kick questionnaire    Fear of Current or Ex-Partner: No    Emotionally Abused: No    Physically Abused: No    Sexually Abused: No    Review of Systems  All other systems reviewed and are negative.       Objective    BP 136/84 (BP Location: Right Arm, Patient Position: Sitting, Cuff Size: Normal)   Pulse 86   Temp 98 F (36.7 C) (Oral)   Resp 16   Ht 5\' 4"  (1.626 m)   Wt 170 lb 3.2 oz (77.2 kg)   SpO2 97%   BMI 29.21 kg/m   Physical Exam Vitals and nursing note reviewed.  Constitutional:      General: She is not in acute distress. Cardiovascular:     Rate and Rhythm: Normal rate and regular rhythm.  Pulmonary:     Effort: Pulmonary effort is normal.     Breath sounds: Normal breath sounds.  Musculoskeletal:     Comments: Utilizing cane for stability  Neurological:     General: No focal deficit present.     Mental Status: She is alert and oriented to person, place, and time.         Assessment & Plan:   Arthralgia, unspecified joint  Primary hypertension     Return in about 6 months (around 07/13/2024) for follow up.   Tommie Raymond, MD

## 2024-02-14 ENCOUNTER — Telehealth: Payer: Self-pay | Admitting: Internal Medicine

## 2024-02-14 ENCOUNTER — Encounter (HOSPITAL_COMMUNITY): Payer: 59

## 2024-02-14 NOTE — Telephone Encounter (Signed)
 Patient called in and wanted to know if we have any samples of [name] because she said she can't get any until next month. Please advise. Call back is [phone]

## 2024-02-15 ENCOUNTER — Telehealth: Payer: Self-pay

## 2024-02-15 NOTE — Telephone Encounter (Signed)
 Sample  Medication:Tresiba  Dose: U200 Quantity:1 pen WGN:[id number]

## 2024-02-16 ENCOUNTER — Telehealth: Payer: Self-pay | Admitting: Internal Medicine

## 2024-02-16 MED ORDER — FARXIGA 10 MG PO TABS: 10.0000 mg | ORAL_TABLET | Freq: Every day | ORAL | 0 refills | Status: DC

## 2024-02-16 NOTE — Telephone Encounter (Signed)
 Patient is in office again stating that it was  Farxiga FARXIGA 10 MG TABS tablet That she needed not [place].

## 2024-02-16 NOTE — Telephone Encounter (Signed)
 Patient came in to office today and picked up one sample of Tresiba.

## 2024-02-16 NOTE — Telephone Encounter (Signed)
 MEDICATION:  Karna Dupes 10 MG TABS tablet  PHARMACY:    [facility] - [address], [place] - [address] (Ph: [phone])    HAS THE PATIENT CONTACTED THEIR PHARMACY?  Yes  IS THIS A 90 DAY SUPPLY : Yes  IS PATIENT OUT OF MEDICATION: No  IF NOT; HOW MUCH IS LEFT: Not sure  LAST APPOINTMENT DATE: @ 12/02/2023  NEXT APPOINTMENT DATE:@4 /02/2024  DO WE HAVE YOUR PERMISSION TO LEAVE A DETAILED MESSAGE?:Yes  OTHER COMMENTS:    **Let patient know to contact pharmacy at the end of the day to make sure medication is ready. **  ** Please notify patient to allow 48-72 hours to process**  **Encourage patient to contact the pharmacy for refills or they can request refills through [facility]**

## 2024-02-17 ENCOUNTER — Ambulatory Visit (HOSPITAL_COMMUNITY)
Admission: RE | Admit: 2024-02-17 | Payer: Self-pay | Source: Ambulatory Visit | Attending: Cardiology | Admitting: Cardiology

## 2024-02-17 ENCOUNTER — Ambulatory Visit (HOSPITAL_COMMUNITY): Payer: Self-pay

## 2024-02-21 ENCOUNTER — Telehealth: Payer: Self-pay

## 2024-02-21 ENCOUNTER — Ambulatory Visit: Payer: BC Managed Care – PPO | Admitting: Family Medicine

## 2024-02-21 NOTE — Telephone Encounter (Signed)
 Copied from CRM [id number]. Topic: Clinical - Medication Question >> Feb 21, 2024  8:43 AM Gery Pray wrote: Reason for CRM: Patient would like to know if office have any samples of Farxiga. Please contact patient at [phone]

## 2024-02-22 ENCOUNTER — Ambulatory Visit: Payer: BC Managed Care – PPO | Admitting: Family Medicine

## 2024-02-22 ENCOUNTER — Telehealth: Payer: Self-pay | Admitting: *Deleted

## 2024-02-22 NOTE — Telephone Encounter (Signed)
 Patient called to come and pickup sample  farxiga. I left VM to call office back

## 2024-03-03 ENCOUNTER — Ambulatory Visit: Payer: 59 | Admitting: Internal Medicine

## 2024-03-10 ENCOUNTER — Ambulatory Visit (HOSPITAL_COMMUNITY): Payer: Self-pay

## 2024-03-22 ENCOUNTER — Other Ambulatory Visit: Payer: Self-pay | Admitting: Family

## 2024-03-22 ENCOUNTER — Other Ambulatory Visit: Payer: Self-pay

## 2024-03-22 DIAGNOSIS — M62838 Other muscle spasm: Secondary | ICD-10-CM

## 2024-03-22 DIAGNOSIS — K219 Gastro-esophageal reflux disease without esophagitis: Secondary | ICD-10-CM

## 2024-03-22 DIAGNOSIS — L299 Pruritus, unspecified: Secondary | ICD-10-CM

## 2024-03-23 ENCOUNTER — Other Ambulatory Visit: Payer: Self-pay

## 2024-03-27 ENCOUNTER — Ambulatory Visit (INDEPENDENT_AMBULATORY_CARE_PROVIDER_SITE_OTHER): Payer: Self-pay | Admitting: Podiatry

## 2024-03-27 ENCOUNTER — Encounter: Payer: Self-pay | Admitting: Podiatry

## 2024-03-27 DIAGNOSIS — M79674 Pain in right toe(s): Secondary | ICD-10-CM

## 2024-03-27 DIAGNOSIS — E1140 Type 2 diabetes mellitus with diabetic neuropathy, unspecified: Secondary | ICD-10-CM

## 2024-03-27 DIAGNOSIS — M79675 Pain in left toe(s): Secondary | ICD-10-CM | POA: Diagnosis not present

## 2024-03-27 DIAGNOSIS — B351 Tinea unguium: Secondary | ICD-10-CM

## 2024-03-27 NOTE — Progress Notes (Signed)
 This patient returns to my office for at risk foot care.  This patient requires this care by a professional since this patient will be at risk due to having diabetic neuropathy.  This patient is unable to cut nails herself since the patient cannot reach her nails.These nails are painful walking and wearing shoes.  This patient presents for at risk foot care today.  General Appearance  Alert, conversant and in no acute stress.  Vascular  Dorsalis pedis and posterior tibial  pulses are palpable  bilaterally.  Capillary return is within normal limits  bilaterally. Temperature is within normal limits  bilaterally.  Neurologic  Senn-Weinstein monofilament wire test diminished  bilaterally. Muscle power within normal limits bilaterally.  Nails Thick disfigured discolored nails with subungual debris  from hallux to fifth toes bilaterally. No evidence of bacterial infection or drainage bilaterally.  Orthopedic  No limitations of motion  feet .  No crepitus or effusions noted.  No bony pathology or digital deformities noted.  Skin  normotropic skin with no porokeratosis noted bilaterally.  No signs of infections or ulcers noted.     Onychomycosis  Pain in right toes  Pain in left toes  Consent was obtained for treatment procedures.   Mechanical debridement of nails 1-5  bilaterally performed with a nail nipper.  Filed with dremel without incident.    Return office visit   9 weeks                  Told patient to return for periodic foot care and evaluation due to potential at risk complications.   Helane Gunther DPM

## 2024-03-28 ENCOUNTER — Telehealth: Payer: Self-pay | Admitting: Pharmacy Technician

## 2024-03-28 ENCOUNTER — Other Ambulatory Visit (HOSPITAL_COMMUNITY): Payer: Self-pay

## 2024-03-28 NOTE — Telephone Encounter (Signed)
 Pharmacy Patient Advocate Encounter   Received notification from CoverMyMeds that prior authorization for Farxiga  10MG  tablets is required/requested.   Insurance verification completed.   The patient is insured through [employer] .   Per test claim: PA required; PA submitted to above mentioned insurance via CoverMyMeds Key/confirmation #/EOC [id number] Status is pending

## 2024-03-28 NOTE — Telephone Encounter (Signed)
 Pharmacy Patient Advocate Encounter  Received notification from [facility] that Prior Authorization for Farxiga  10MG  tablets has been APPROVED from 03/28/2024 to 03/28/2025. Ran test claim, Copay is $4.00. This test claim was processed through [facility]- copay amounts may vary at other pharmacies due to pharmacy/plan contracts, or as the patient moves through the different stages of their insurance plan.   PA #/Case ID/Reference #: [id number]

## 2024-03-29 ENCOUNTER — Ambulatory Visit (INDEPENDENT_AMBULATORY_CARE_PROVIDER_SITE_OTHER): Admitting: Family Medicine

## 2024-03-29 ENCOUNTER — Encounter: Payer: Self-pay | Admitting: Family Medicine

## 2024-03-29 VITALS — BP 143/86 | HR 64 | Wt 168.0 lb

## 2024-03-29 DIAGNOSIS — I10 Essential (primary) hypertension: Secondary | ICD-10-CM

## 2024-03-29 DIAGNOSIS — B3731 Acute candidiasis of vulva and vagina: Secondary | ICD-10-CM | POA: Diagnosis not present

## 2024-03-29 DIAGNOSIS — E785 Hyperlipidemia, unspecified: Secondary | ICD-10-CM

## 2024-03-29 DIAGNOSIS — E1165 Type 2 diabetes mellitus with hyperglycemia: Secondary | ICD-10-CM

## 2024-03-29 DIAGNOSIS — Z794 Long term (current) use of insulin: Secondary | ICD-10-CM

## 2024-03-29 DIAGNOSIS — E1169 Type 2 diabetes mellitus with other specified complication: Secondary | ICD-10-CM | POA: Diagnosis not present

## 2024-03-29 LAB — POCT GLYCOSYLATED HEMOGLOBIN (HGB A1C): HbA1c, POC (controlled diabetic range): 12.1 % — AB (ref 0.0–7.0)

## 2024-03-29 MED ORDER — FARXIGA 10 MG PO TABS: 10.0000 mg | ORAL_TABLET | Freq: Every day | ORAL | 0 refills | Status: DC

## 2024-03-29 MED ORDER — FLUCONAZOLE 150 MG PO TABS: 150.0000 mg | ORAL_TABLET | Freq: Once | ORAL | 0 refills | Status: AC

## 2024-03-29 NOTE — Progress Notes (Signed)
 Established Patient Office Visit  Subjective    Patient ID: [name], female    DOB: [birth year]  Age: 63 y.o. MRN: [id number]  CC:  Chief Complaint  Patient presents with   Medical Management of Chronic Issues   Medication Refill    HPI [name] presents for routine follow up of chronic med issues including diabetes and hypertension. Patient reports that she has not been able to get her farxiga . She does reports a vaginal yeast infection. .   Outpatient Encounter Medications as of 03/29/2024  Medication Sig   Ascorbic Acid (VITAMIN C PO) Take 1 tablet by mouth daily.   aspirin  81 MG chewable tablet Chew 1 tablet (81 mg total) by mouth 2 (two) times daily.   atorvastatin  (LIPITOR) 40 MG tablet Take 1 tablet (40 mg total) by mouth daily.   Blood Glucose Monitoring Suppl (ACCU-CHEK AVIVA PLUS) w/Device KIT Use to check blood sugar 2 times a day   cyanocobalamin  (VITAMIN B12) 1000 MCG tablet Take 1 tablet (1,000 mcg total) by mouth daily.   diclofenac  Sodium (VOLTAREN ) 1 % GEL Apply 4 g topically 4 (four) times daily.   fluconazole  (DIFLUCAN ) 150 MG tablet Take 1 tablet (150 mg total) by mouth once for 1 dose.   gabapentin  (NEURONTIN ) 600 MG tablet Take 1 tablet (600 mg total) by mouth 3 (three) times daily. (Patient taking differently: Take 600 mg by mouth daily.)   glucose blood (BAYER CONTOUR TEST) test strip TEST BLOOD SUGARS 3 TIMES DAILY   glucose blood test strip Use to check blood sugar 2 times a day   HYDROcodone -acetaminophen  (NORCO/VICODIN) 5-325 MG tablet Take 1 tablet by mouth every 6 (six) hours as needed for moderate pain (pain score 4-6).   HYDROmorphone  (DILAUDID ) 2 MG tablet Take 2 mg by mouth as needed for severe pain.   hydrOXYzine  (ATARAX ) 25 MG tablet TAKE 1 TABLET BY MOUTH THREE TIMES DAILY AS NEEDED FOR  ITCHING   insulin  aspart (FIASP  FLEXTOUCH) 100 UNIT/ML FlexTouch Pen Inject 6-20 Units into the skin 3 (three) times daily before meals. (Patient  taking differently: Inject 32 Units into the skin daily.)   insulin  degludec (TRESIBA  FLEXTOUCH) 200 UNIT/ML FlexTouch Pen INJECT 30-34 UNITS SUBCUTANEOUSLY ONCE DAILY   Insulin  Pen Needle 32G X 4 MM MISC Use 4x a day   Lancet Devices (EASY TOUCH LANCING DEVICE) MISC Use to check diabetes 4 times daily  E 11.65   latanoprost  (XALATAN ) 0.005 % ophthalmic solution Place 1 drop into both eyes at bedtime.   losartan  (COZAAR ) 25 MG tablet Take 1 tablet (25 mg total) by mouth every evening.   metFORMIN  (GLUCOPHAGE -XR) 500 MG 24 hr tablet Take 2 tablets (1,000 mg total) by mouth daily with supper.   methylPREDNISolone  (MEDROL  DOSEPAK) 4 MG TBPK tablet Use po daily as directed   metoCLOPramide  (REGLAN ) 10 MG tablet Take 1 tablet (10 mg total) by mouth every 8 (eight) hours as needed for nausea.   metoprolol  succinate (TOPROL -XL) 50 MG 24 hr tablet TAKE 1 TABLET BY MOUTH ONCE DAILY. TAKE  WITH  OR  IMMEDIATELY  FOLLOWING  A  MEAL   Na Sulfate-K Sulfate-Mg Sulf 17.5-3.13-1.6 GM/177ML SOLN Take by mouth.   naloxone  (NARCAN ) nasal spray 4 mg/0.1 mL Place 1 spray into the nose as needed (or).   ondansetron  (ZOFRAN -ODT) 8 MG disintegrating tablet Take 1 tablet (8 mg total) by mouth every 8 (eight) hours as needed for nausea or vomiting.   pantoprazole  (PROTONIX ) 40  MG tablet Take 1 tablet by mouth twice daily   tiZANidine  (ZANAFLEX ) 4 MG tablet TAKE 1 TABLET BY MOUTH EVERY 6 HOURS AS NEEDED FOR MUSCLE SPASM   triamcinolone  cream (KENALOG ) 0.1 % Apply 1 Application topically daily.   [DISCONTINUED] FARXIGA  10 MG TABS tablet Take 1 tablet (10 mg total) by mouth daily.   ezetimibe  (ZETIA ) 10 MG tablet Take 1 tablet (10 mg total) by mouth daily.   FARXIGA  10 MG TABS tablet Take 1 tablet (10 mg total) by mouth daily.   No facility-administered encounter medications on file as of 03/29/2024.    Past Medical History:  Diagnosis Date   Arthritis    Campylobacter enteritis 03/2011   [facility] admission   Carpal  tunnel syndrome, bilateral    Diabetes mellitus    Gastritis with bleeding due to alcohol 2010   [facility] admission   GERD (gastroesophageal reflux disease)    HIDRADENITIS SUPPURATIVA 04/11/2007   Qualifier: Diagnosis of   By: Bonita Bussing MD, Nicholos Barlow        Hyperlipidemia    Hypertension    no meds   Menopause    Myocardial infarction [facility])    Neck pain    Wears glasses     Past Surgical History:  Procedure Laterality Date   CARPAL TUNNEL RELEASE Left 05/14/2014   Procedure: LEFT ULNAR NEUROPLASTY AT ELBOW AND ENDOSCOPIC CARPAL TUNNEL RELEASE;  Surgeon: Sheryl Donna, MD;  Location: [facility];  Service: Orthopedics;  Laterality: Left;   CORONARY STENT INTERVENTION N/A 10/09/2019   Procedure: CORONARY STENT INTERVENTION;  Surgeon: Knox Perl, MD;  Location: [facility];  Service: Cardiovascular;  Laterality: N/A;   CYST EXCISION  1995   under arms   endometrial biopsy  2004   benign   FOOT OSTEOTOMY  2010   right-spurs   LEFT HEART CATH AND CORONARY ANGIOGRAPHY N/A 10/09/2019   Procedure: LEFT HEART CATH AND CORONARY ANGIOGRAPHY;  Surgeon: Knox Perl, MD;  Location: [facility];  Service: Cardiovascular;  Laterality: N/A;   TONSILLECTOMY     TOTAL KNEE ARTHROPLASTY Left 02/26/2023   Procedure: LEFT TOTAL KNEE ARTHROPLASTY;  Surgeon: Arnie Lao, MD;  Location: [facility];  Service: Orthopedics;  Laterality: Left;  Needs RNFA   ULNAR NERVE TRANSPOSITION Left 05/14/2014   Procedure: ULNAR NERVE DECOMPRESSION/TRANSPOSITION;  Surgeon: Sheryl Donna, MD;  Location: [facility];  Service: Orthopedics;  Laterality: Left;    Family History  Problem Relation Age of Onset   Hypertension Mother    Diabetes Mother    Heart attack Mother    Stroke Father    Diabetes Father    Heart attack Sister    Diabetes Maternal Grandmother    Hypertension Maternal Grandmother    Colon cancer Neg Hx    Esophageal cancer Neg Hx    Rectal cancer  Neg Hx    Stomach cancer Neg Hx     Social History   Socioeconomic History   Marital status: Single    Spouse name: Not on file   Number of children: 2   Years of education: 12   Highest education level: 11th grade  Occupational History   Not on file  Tobacco Use   Smoking status: Never    Passive exposure: Past   Smokeless tobacco: Never  Vaping Use   Vaping status: Never Used  Substance and Sexual Activity   Alcohol use: Not Currently   Drug use: Yes  Types: Marijuana    Comment: sometimes; last use on Tuesday, 07/20/23   Sexual activity: Yes    Birth control/protection: None    Comment: same partner x 10 years  Other Topics Concern   Not on file  Social History Narrative   She works as a Arboriculturist.   Right-handed.   One cup caffeine  per day.   She lives at home with daughter.   Social Drivers of Health   Financial Resource Strain: Medium Risk (03/22/2024)   Overall Financial Resource Strain (CARDIA)    Difficulty of Paying Living Expenses: Somewhat hard  Food Insecurity: Food Insecurity Present (03/22/2024)   Hunger Vital Sign    Worried About Running Out of Food in the Last Year: Sometimes true    Ran Out of Food in the Last Year: Sometimes true  Transportation Needs: No Transportation Needs (03/22/2024)   PRAPARE - Administrator, Civil Service (Medical): No    Lack of Transportation (Non-Medical): No  Physical Activity: Inactive (03/22/2024)   Exercise Vital Sign    Days of Exercise per Week: 0 days    Minutes of Exercise per Session: 0 min  Stress: Stress Concern Present (03/22/2024)   [employer] of Occupational Health - Occupational Stress Questionnaire    Feeling of Stress : To some extent  Social Connections: Moderately Integrated (03/22/2024)   Social Connection and Isolation Panel [NHANES]    Frequency of Communication with Friends and Family: More than three times a week    Frequency of Social Gatherings with Friends and Family:  Twice a week    Attends Religious Services: 1 to 4 times per year    Active Member of [employer] or Organizations: Yes    Attends Banker Meetings: 1 to 4 times per year    Marital Status: Never married  Intimate Partner Violence: Not At Risk (05/31/2023)   Humiliation, Afraid, Rape, and Kick questionnaire    Fear of Current or Ex-Partner: No    Emotionally Abused: No    Physically Abused: No    Sexually Abused: No    Review of Systems  All other systems reviewed and are negative.       Objective    BP (!) 143/86 (BP Location: Right Arm, Patient Position: Sitting, Cuff Size: Normal)   Pulse 64   Wt 168 lb (76.2 kg)   SpO2 (!) 83%   BMI 28.84 kg/m   Physical Exam Vitals and nursing note reviewed.  Constitutional:      General: She is not in acute distress. Cardiovascular:     Rate and Rhythm: Normal rate and regular rhythm.  Pulmonary:     Effort: Pulmonary effort is normal.     Breath sounds: Normal breath sounds.  Musculoskeletal:     Comments: Utilizing cane for stability  Neurological:     General: No focal deficit present.     Mental Status: She is alert and oriented to person, place, and time.         Assessment & Plan:   Type 2 diabetes mellitus with hyperglycemia, with long-term current use of insulin  (HCC) -     POCT glycosylated hemoglobin (Hb A1C)  Essential hypertension  Yeast vaginitis  Hyperlipidemia associated with type 2 diabetes mellitus (HCC)  Other orders -     Fluconazole ; Take 1 tablet (150 mg total) by mouth once for 1 dose.  Dispense: 1 tablet; Refill: 0 -     Farxiga ; Take 1 tablet (10 mg total) by  mouth daily.  Dispense: 90 tablet; Refill: 0     Return in about 3 months (around 06/28/2024) for follow up.   Arlo Lama, MD

## 2024-03-30 ENCOUNTER — Other Ambulatory Visit (HOSPITAL_BASED_OUTPATIENT_CLINIC_OR_DEPARTMENT_OTHER): Payer: Self-pay | Admitting: Cardiology

## 2024-03-30 DIAGNOSIS — I739 Peripheral vascular disease, unspecified: Secondary | ICD-10-CM

## 2024-03-31 ENCOUNTER — Other Ambulatory Visit: Payer: Self-pay

## 2024-03-31 ENCOUNTER — Encounter: Payer: Self-pay | Admitting: Pharmacist

## 2024-03-31 ENCOUNTER — Ambulatory Visit: Attending: Family Medicine | Admitting: Pharmacist

## 2024-03-31 ENCOUNTER — Telehealth: Payer: Self-pay | Admitting: Pharmacist

## 2024-03-31 DIAGNOSIS — Z7984 Long term (current) use of oral hypoglycemic drugs: Secondary | ICD-10-CM | POA: Diagnosis not present

## 2024-03-31 DIAGNOSIS — Z794 Long term (current) use of insulin: Secondary | ICD-10-CM | POA: Diagnosis not present

## 2024-03-31 DIAGNOSIS — E1165 Type 2 diabetes mellitus with hyperglycemia: Secondary | ICD-10-CM

## 2024-03-31 MED ORDER — NOVOLOG FLEXPEN 100 UNIT/ML ~~LOC~~ SOPN
10.0000 [IU] | PEN_INJECTOR | Freq: Three times a day (TID) | SUBCUTANEOUS | 2 refills | Status: DC
  Filled 2024-03-31: qty 15, 50d supply, fill #0

## 2024-03-31 MED ORDER — PEN NEEDLES 32G X 4 MM MISC
Freq: Four times a day (QID) | 2 refills | Status: DC
  Filled 2024-03-31: qty 100, 25d supply, fill #0

## 2024-03-31 MED ORDER — FREESTYLE LIBRE 3 PLUS SENSOR MISC
6 refills | Status: DC
  Filled 2024-03-31: qty 2, 30d supply, fill #0

## 2024-03-31 MED ORDER — FREESTYLE LIBRE 3 READER DEVI
0 refills | Status: DC
  Filled 2024-03-31: qty 1, 396d supply, fill #0

## 2024-03-31 MED ORDER — LANTUS SOLOSTAR 100 UNIT/ML ~~LOC~~ SOPN
30.0000 [IU] | PEN_INJECTOR | Freq: Every day | SUBCUTANEOUS | 2 refills | Status: DC
  Filled 2024-03-31: qty 15, 50d supply, fill #0

## 2024-03-31 NOTE — Progress Notes (Signed)
 S:     No chief complaint on file.  63 y.o. female who presents for diabetes evaluation, education, and management. Patient arrives in good spirits and presents without any assistance.   Patient was referred and last seen by Primary Care Provider, Dr. Elvan Hamel, on 03/29/2024.  At that visit, her A1c was 12.1 (up from 7.7% prior). She was referred to me for further management   PMH is significant for CAD sp MI in 2021. Pt tells me today she had 3 stents placed. She also has a hx of T2DM, HTN, GERD, pancreatitis d/t Ozempic , HLD, peripheral neuropathy secondary to DM, chronic neck and back pain, arthritis, obesity, urinary incontinence.   Patient reports Diabetes was diagnosed in 63. Has a hx of clinical ASCVD. Has been on insulin  for several years. No CKD, stroke, CHF hx. No thyroid  cancer hx. She has a CGM but her system is outdated and sensors will not sync to her receiver. Additionally, she went a period of a couple of months without insulin  due to insurance issues. Luckily, she now has been approved for Medicaid and is amenable to me sending rxns downstairs to our pharmacy.   Family/Social History:  Fhx: HTN, DM MI  Tobacco: never smoker  Alcohol: none reported   Current diabetes medications include: Farxiga  10 mg daily, Fiasp  (not taking - out), Tresiba  (not taking, out), metformin  500 mg XR, 2 tablets daily  Patient denies adherence to insulin . She is takes metformin  and Farxiga .   Insurance coverage: [place] Medicaid  Patient denies hypoglycemic events.  Reported home fasting blood sugars: none   Reported 2 hour post-meal/random blood sugars: none.  Patient reports nocturia (nighttime urination).  Patient reports neuropathy (nerve pain). Patient denies visual changes. Patient reports self foot exams.   Patient reported dietary habits:  -Tries to follow a proper CM diet  Patient-reported exercise habits: none due to chronic pain  O:  No CGM or GM to review today.  Lab  Results  Component Value Date   HGBA1C 12.1 (A) 03/29/2024   There were no vitals filed for this visit.  Lipid Panel     Component Value Date/Time   CHOL 309 (H) 11/26/2023 1022   TRIG 119 11/26/2023 1022   HDL 74 11/26/2023 1022   CHOLHDL 4.2 11/26/2023 1022   CHOLHDL 4.4 10/09/2019 1000   VLDL 26 10/09/2019 1000   LDLCALC 214 (H) 11/26/2023 1022   LDLCALC 150 (H) 12/21/2018 1706   LDLDIRECT 94.0 09/25/2015 0956    Clinical Atherosclerotic Cardiovascular Disease (ASCVD): Yes  The ASCVD Risk score (Arnett DK, et al., 2019) failed to calculate for the following reasons:   Risk score cannot be calculated because patient has a medical history suggesting prior/existing ASCVD   Patient is participating in a Managed Medicaid Plan: No   A/P: Diabetes longstanding currently uncontrolled. Patient is not currently hypoglycemic but is able to verbalize appropriate hypoglycemia management plan. She has symptomatic hyperglycemia since being without insulin . We will restart her with a more 50/50 split basal/bolus regimen and titrate based on her CGM data in 1 month. Updated rxns for insulins and CGM supplies sent to our pharmacy. -Started Lantus  30 units daily. -Started Novolog  10 units TID before meals.  -Continue metformin  and Farxiga  at current doses.  -Started Libre 3 + CGM supplies. -Patient educated on purpose, proper use, and potential adverse effects of insulin , Farxiga , and metformin .  -Extensively discussed pathophysiology of diabetes, recommended lifestyle interventions, dietary effects on blood sugar control.  -Counseled  on s/sx of and management of hypoglycemia.  -Next A1c anticipated 05/2024.   Written patient instructions provided. Patient verbalized understanding of treatment plan.  Total time in face to face counseling 30 minutes.    Follow-up:  Pharmacist in 1 month.  Marene Shape, PharmD, Becky Bowels, CPP Clinical Pharmacist [facility] [phone]

## 2024-03-31 NOTE — Telephone Encounter (Signed)
 Can we start a PA for [facility] for this patient? She has Medicaid but is on a total of 4 insulin  injections daily.

## 2024-04-03 ENCOUNTER — Other Ambulatory Visit: Payer: Self-pay | Admitting: Pharmacist

## 2024-04-03 ENCOUNTER — Other Ambulatory Visit: Payer: Self-pay

## 2024-04-03 DIAGNOSIS — E1165 Type 2 diabetes mellitus with hyperglycemia: Secondary | ICD-10-CM

## 2024-04-03 MED ORDER — DEXCOM G7 RECEIVER DEVI: 0 refills | Status: DC

## 2024-04-04 ENCOUNTER — Other Ambulatory Visit: Payer: Self-pay

## 2024-04-11 ENCOUNTER — Ambulatory Visit (INDEPENDENT_AMBULATORY_CARE_PROVIDER_SITE_OTHER): Admitting: Family Medicine

## 2024-04-11 ENCOUNTER — Encounter: Payer: Self-pay | Admitting: Family Medicine

## 2024-04-11 VITALS — BP 119/78 | HR 67 | Wt 173.2 lb

## 2024-04-11 DIAGNOSIS — Z0289 Encounter for other administrative examinations: Secondary | ICD-10-CM

## 2024-04-11 DIAGNOSIS — Z9889 Other specified postprocedural states: Secondary | ICD-10-CM

## 2024-04-11 DIAGNOSIS — M2550 Pain in unspecified joint: Secondary | ICD-10-CM | POA: Diagnosis not present

## 2024-04-11 MED ORDER — NAPROXEN 500 MG PO TABS: 500.0000 mg | ORAL_TABLET | Freq: Two times a day (BID) | ORAL | 2 refills | Status: DC

## 2024-04-12 ENCOUNTER — Encounter: Payer: Self-pay | Admitting: Family Medicine

## 2024-04-12 NOTE — Progress Notes (Signed)
 Established Patient Office Visit  Subjective    Patient ID: [name], female    DOB: [birth year]  Age: 63 y.o. MRN: [id number]  CC:  Chief Complaint  Patient presents with   PAPERWORK    New Med Request    For Naproxen      HPI [name] presents for completion of form for disability related to her left knee pain/surgery and her multiple joint pains.   Outpatient Encounter Medications as of 04/11/2024  Medication Sig   Ascorbic Acid (VITAMIN C PO) Take 1 tablet by mouth daily.   aspirin  81 MG chewable tablet Chew 1 tablet (81 mg total) by mouth 2 (two) times daily.   atorvastatin  (LIPITOR) 40 MG tablet Take 1 tablet (40 mg total) by mouth daily.   Blood Glucose Monitoring Suppl (ACCU-CHEK AVIVA PLUS) w/Device KIT Use to check blood sugar 2 times a day   Continuous Glucose Receiver (DEXCOM G7 RECEIVER) DEVI Use to check blood glucose continuously. E11.65   Continuous Glucose Sensor (FREESTYLE LIBRE 3 PLUS SENSOR) MISC Change sensor every 15 days. Use to check blood sugar continuously.   cyanocobalamin  (VITAMIN B12) 1000 MCG tablet Take 1 tablet (1,000 mcg total) by mouth daily.   diclofenac  Sodium (VOLTAREN ) 1 % GEL Apply 4 g topically 4 (four) times daily.   FARXIGA  10 MG TABS tablet Take 1 tablet (10 mg total) by mouth daily.   gabapentin  (NEURONTIN ) 600 MG tablet Take 1 tablet (600 mg total) by mouth 3 (three) times daily. (Patient taking differently: Take 600 mg by mouth daily.)   glucose blood (BAYER CONTOUR TEST) test strip TEST BLOOD SUGARS 3 TIMES DAILY   glucose blood test strip Use to check blood sugar 2 times a day   HYDROcodone -acetaminophen  (NORCO/VICODIN) 5-325 MG tablet Take 1 tablet by mouth every 6 (six) hours as needed for moderate pain (pain score 4-6).   HYDROmorphone  (DILAUDID ) 2 MG tablet Take 2 mg by mouth as needed for severe pain.   hydrOXYzine  (ATARAX ) 25 MG tablet TAKE 1 TABLET BY MOUTH THREE TIMES DAILY AS NEEDED FOR  ITCHING   insulin  aspart  (NOVOLOG  FLEXPEN) 100 UNIT/ML FlexPen Inject 10 Units into the skin 3 (three) times daily with meals.   insulin  glargine (LANTUS  SOLOSTAR) 100 UNIT/ML Solostar Pen Inject 30 Units into the skin daily.   Insulin  Pen Needle (PEN NEEDLES) 32G X 4 MM MISC Inject as directed in the morning, at noon, in the evening, and at bedtime. (FOUR TIMES A DAY)   Lancet Devices (EASY TOUCH LANCING DEVICE) MISC Use to check diabetes 4 times daily  E 11.65   latanoprost  (XALATAN ) 0.005 % ophthalmic solution Place 1 drop into both eyes at bedtime.   losartan  (COZAAR ) 25 MG tablet Take 1 tablet (25 mg total) by mouth every evening.   metFORMIN  (GLUCOPHAGE -XR) 500 MG 24 hr tablet Take 2 tablets (1,000 mg total) by mouth daily with supper.   methylPREDNISolone  (MEDROL  DOSEPAK) 4 MG TBPK tablet Use po daily as directed   metoCLOPramide  (REGLAN ) 10 MG tablet Take 1 tablet (10 mg total) by mouth every 8 (eight) hours as needed for nausea.   metoprolol  succinate (TOPROL -XL) 50 MG 24 hr tablet TAKE 1 TABLET BY MOUTH ONCE DAILY. TAKE  WITH  OR  IMMEDIATELY  FOLLOWING  A  MEAL   Na Sulfate-K Sulfate-Mg Sulf 17.5-3.13-1.6 GM/177ML SOLN Take by mouth.   naloxone  (NARCAN ) nasal spray 4 mg/0.1 mL Place 1 spray into the nose as needed (or).  naproxen  (NAPROSYN ) 500 MG tablet Take 1 tablet (500 mg total) by mouth 2 (two) times daily with a meal.   ondansetron  (ZOFRAN -ODT) 8 MG disintegrating tablet Take 1 tablet (8 mg total) by mouth every 8 (eight) hours as needed for nausea or vomiting.   pantoprazole  (PROTONIX ) 40 MG tablet Take 1 tablet by mouth twice daily   tiZANidine  (ZANAFLEX ) 4 MG tablet TAKE 1 TABLET BY MOUTH EVERY 6 HOURS AS NEEDED FOR MUSCLE SPASM   triamcinolone  cream (KENALOG ) 0.1 % Apply 1 Application topically daily.   ezetimibe  (ZETIA ) 10 MG tablet Take 1 tablet (10 mg total) by mouth daily.   No facility-administered encounter medications on file as of 04/11/2024.    Past Medical History:  Diagnosis Date    Arthritis    Campylobacter enteritis 03/2011   [facility] admission   Carpal tunnel syndrome, bilateral    Diabetes mellitus    Gastritis with bleeding due to alcohol 2010   [facility] admission   GERD (gastroesophageal reflux disease)    HIDRADENITIS SUPPURATIVA 04/11/2007   Qualifier: Diagnosis of   By: Bonita Bussing MD, Nicholos Barlow        Hyperlipidemia    Hypertension    no meds   Menopause    Myocardial infarction [facility])    Neck pain    Wears glasses     Past Surgical History:  Procedure Laterality Date   CARPAL TUNNEL RELEASE Left 05/14/2014   Procedure: LEFT ULNAR NEUROPLASTY AT ELBOW AND ENDOSCOPIC CARPAL TUNNEL RELEASE;  Surgeon: Sheryl Donna, MD;  Location: [facility];  Service: Orthopedics;  Laterality: Left;   CORONARY STENT INTERVENTION N/A 10/09/2019   Procedure: CORONARY STENT INTERVENTION;  Surgeon: Knox Perl, MD;  Location: [facility];  Service: Cardiovascular;  Laterality: N/A;   CYST EXCISION  1995   under arms   endometrial biopsy  2004   benign   FOOT OSTEOTOMY  2010   right-spurs   LEFT HEART CATH AND CORONARY ANGIOGRAPHY N/A 10/09/2019   Procedure: LEFT HEART CATH AND CORONARY ANGIOGRAPHY;  Surgeon: Knox Perl, MD;  Location: [facility];  Service: Cardiovascular;  Laterality: N/A;   TONSILLECTOMY     TOTAL KNEE ARTHROPLASTY Left 02/26/2023   Procedure: LEFT TOTAL KNEE ARTHROPLASTY;  Surgeon: Arnie Lao, MD;  Location: [facility];  Service: Orthopedics;  Laterality: Left;  Needs RNFA   ULNAR NERVE TRANSPOSITION Left 05/14/2014   Procedure: ULNAR NERVE DECOMPRESSION/TRANSPOSITION;  Surgeon: Sheryl Donna, MD;  Location: [facility];  Service: Orthopedics;  Laterality: Left;    Family History  Problem Relation Age of Onset   Hypertension Mother    Diabetes Mother    Heart attack Mother    Stroke Father    Diabetes Father    Heart attack Sister    Diabetes Maternal Grandmother    Hypertension Maternal  Grandmother    Colon cancer Neg Hx    Esophageal cancer Neg Hx    Rectal cancer Neg Hx    Stomach cancer Neg Hx     Social History   Socioeconomic History   Marital status: Single    Spouse name: Not on file   Number of children: 2   Years of education: 12   Highest education level: 11th grade  Occupational History   Not on file  Tobacco Use   Smoking status: Never    Passive exposure: Past   Smokeless tobacco: Never  Vaping Use   Vaping status: Never Used  Substance and Sexual Activity   Alcohol use: Not Currently   Drug use: Yes    Types: Marijuana    Comment: sometimes; last use on Tuesday, 07/20/23   Sexual activity: Yes    Birth control/protection: None    Comment: same partner x 10 years  Other Topics Concern   Not on file  Social History Narrative   She works as a Arboriculturist.   Right-handed.   One cup caffeine  per day.   She lives at home with daughter.   Social Drivers of Health   Financial Resource Strain: Medium Risk (03/22/2024)   Overall Financial Resource Strain (CARDIA)    Difficulty of Paying Living Expenses: Somewhat hard  Food Insecurity: Food Insecurity Present (03/22/2024)   Hunger Vital Sign    Worried About Running Out of Food in the Last Year: Sometimes true    Ran Out of Food in the Last Year: Sometimes true  Transportation Needs: No Transportation Needs (03/22/2024)   PRAPARE - Administrator, Civil Service (Medical): No    Lack of Transportation (Non-Medical): No  Physical Activity: Inactive (03/22/2024)   Exercise Vital Sign    Days of Exercise per Week: 0 days    Minutes of Exercise per Session: 0 min  Stress: Stress Concern Present (03/22/2024)   [employer] of Occupational Health - Occupational Stress Questionnaire    Feeling of Stress : To some extent  Social Connections: Moderately Integrated (03/22/2024)   Social Connection and Isolation Panel [NHANES]    Frequency of Communication with Friends and Family: More than  three times a week    Frequency of Social Gatherings with Friends and Family: Twice a week    Attends Religious Services: 1 to 4 times per year    Active Member of [employer] or Organizations: Yes    Attends Banker Meetings: 1 to 4 times per year    Marital Status: Never married  Intimate Partner Violence: Not At Risk (05/31/2023)   Humiliation, Afraid, Rape, and Kick questionnaire    Fear of Current or Ex-Partner: No    Emotionally Abused: No    Physically Abused: No    Sexually Abused: No    Review of Systems  Musculoskeletal:  Positive for joint pain.  All other systems reviewed and are negative.       Objective    BP 119/78 (BP Location: Right Arm, Patient Position: Sitting, Cuff Size: Normal)   Pulse 67   Wt 173 lb 3.2 oz (78.6 kg)   SpO2 97%   BMI 29.73 kg/m   Physical Exam Vitals and nursing note reviewed.  Constitutional:      General: She is not in acute distress. Cardiovascular:     Rate and Rhythm: Normal rate and regular rhythm.  Pulmonary:     Effort: Pulmonary effort is normal.     Breath sounds: Normal breath sounds.  Musculoskeletal:     Comments: Utilizing cane for stability  Neurological:     General: No focal deficit present.     Mental Status: She is alert and oriented to person, place, and time.         Assessment & Plan:   Arthralgia, unspecified joint  History of left knee surgery  Encounter for completion of form with patient  Other orders -     Naproxen ; Take 1 tablet (500 mg total) by mouth 2 (two) times daily with a meal.  Dispense: 60 tablet; Refill: 2     No follow-ups  on file.   Arlo Lama, MD

## 2024-04-20 ENCOUNTER — Ambulatory Visit (HOSPITAL_COMMUNITY)
Admission: RE | Admit: 2024-04-20 | Discharge: 2024-04-20 | Disposition: A | Discharge status: Home / Self Care | Source: Ambulatory Visit | Attending: Cardiology | Admitting: Cardiology

## 2024-04-20 ENCOUNTER — Encounter: Payer: BC Managed Care – PPO | Admitting: Internal Medicine

## 2024-04-20 ENCOUNTER — Ambulatory Visit: Payer: Self-pay | Admitting: Cardiology

## 2024-04-20 DIAGNOSIS — I739 Peripheral vascular disease, unspecified: Secondary | ICD-10-CM | POA: Insufficient documentation

## 2024-04-20 LAB — VAS US ABI WITH/WO TBI
Left ABI: 1.22
Right ABI: 1.23

## 2024-04-20 NOTE — Progress Notes (Signed)
 Mild peripheral arterial disease is noted, symptoms could be related to Diabetes mellitus however if symptoms are very bad, I could schedule her for peripheral arteriogram.

## 2024-05-01 ENCOUNTER — Other Ambulatory Visit: Payer: Self-pay

## 2024-05-01 ENCOUNTER — Ambulatory Visit: Attending: Family Medicine | Admitting: Pharmacist

## 2024-05-01 ENCOUNTER — Encounter: Payer: Self-pay | Admitting: Pharmacist

## 2024-05-01 DIAGNOSIS — R112 Nausea with vomiting, unspecified: Secondary | ICD-10-CM

## 2024-05-01 DIAGNOSIS — Z7984 Long term (current) use of oral hypoglycemic drugs: Secondary | ICD-10-CM | POA: Diagnosis not present

## 2024-05-01 DIAGNOSIS — E1165 Type 2 diabetes mellitus with hyperglycemia: Secondary | ICD-10-CM

## 2024-05-01 DIAGNOSIS — Z794 Long term (current) use of insulin: Secondary | ICD-10-CM

## 2024-05-01 DIAGNOSIS — E782 Mixed hyperlipidemia: Secondary | ICD-10-CM

## 2024-05-01 DIAGNOSIS — I25118 Atherosclerotic heart disease of native coronary artery with other forms of angina pectoris: Secondary | ICD-10-CM

## 2024-05-01 DIAGNOSIS — I10 Essential (primary) hypertension: Secondary | ICD-10-CM

## 2024-05-01 MED ORDER — FARXIGA 10 MG PO TABS
10.0000 mg | ORAL_TABLET | Freq: Every day | ORAL | 0 refills | Status: AC
  Filled 2024-05-01: qty 90, 90d supply, fill #0

## 2024-05-01 MED ORDER — LANTUS SOLOSTAR 100 UNIT/ML ~~LOC~~ SOPN
30.0000 [IU] | PEN_INJECTOR | Freq: Every day | SUBCUTANEOUS | 2 refills | Status: DC
  Filled 2024-05-01: qty 15, 50d supply, fill #0

## 2024-05-01 MED ORDER — NOVOLOG FLEXPEN 100 UNIT/ML ~~LOC~~ SOPN
10.0000 [IU] | PEN_INJECTOR | Freq: Three times a day (TID) | SUBCUTANEOUS | 2 refills | Status: DC
  Filled 2024-05-01: qty 15, 50d supply, fill #0

## 2024-05-01 MED ORDER — PEN NEEDLES 32G X 4 MM MISC
2 refills | Status: AC
  Filled 2024-05-01: qty 300, 75d supply, fill #0

## 2024-05-01 MED ORDER — EZETIMIBE 10 MG PO TABS
10.0000 mg | ORAL_TABLET | Freq: Every day | ORAL | 3 refills | Status: AC
Start: 2024-05-01 — End: ?
  Filled 2024-05-01: qty 90, 90d supply, fill #0
  Filled 2024-09-29: qty 30, 30d supply, fill #1
  Filled 2024-11-27: qty 30, 30d supply, fill #2
  Filled 2024-12-27: qty 30, 30d supply, fill #3

## 2024-05-01 MED ORDER — LOSARTAN POTASSIUM 25 MG PO TABS
25.0000 mg | ORAL_TABLET | Freq: Every evening | ORAL | 3 refills | Status: AC
  Filled 2024-05-01: qty 90, 90d supply, fill #0
  Filled 2024-07-24: qty 90, 90d supply, fill #1

## 2024-05-01 MED ORDER — ATORVASTATIN CALCIUM 40 MG PO TABS
40.0000 mg | ORAL_TABLET | Freq: Every day | ORAL | 3 refills | Status: DC
  Filled 2024-05-01: qty 90, 90d supply, fill #0

## 2024-05-01 MED ORDER — NOVOLOG FLEXPEN 100 UNIT/ML ~~LOC~~ SOPN
12.0000 [IU] | PEN_INJECTOR | Freq: Three times a day (TID) | SUBCUTANEOUS | 2 refills | Status: DC
  Filled 2024-05-01: qty 15, fill #0

## 2024-05-01 MED ORDER — DEXCOM G7 RECEIVER DEVI
0 refills | Status: DC
  Filled 2024-05-01: qty 1, fill #0

## 2024-05-01 MED ORDER — LANTUS SOLOSTAR 100 UNIT/ML ~~LOC~~ SOPN
36.0000 [IU] | PEN_INJECTOR | Freq: Every day | SUBCUTANEOUS | 2 refills | Status: DC
  Filled 2024-05-01: qty 15, 41d supply, fill #0

## 2024-05-01 MED ORDER — METFORMIN HCL ER 500 MG PO TB24
1000.0000 mg | ORAL_TABLET | Freq: Every day | ORAL | 3 refills | Status: DC
  Filled 2024-05-01: qty 180, 90d supply, fill #0

## 2024-05-01 NOTE — Telephone Encounter (Signed)
 Pt returning nurse call

## 2024-05-01 NOTE — Progress Notes (Signed)
 S:     No chief complaint on file.  63 y.o. female who presents for diabetes evaluation, education, and management. Patient arrives in good spirits and presents without any assistance.   Patient was referred and last seen by Primary Care Provider, Dr. Elvan Hamel, on 03/29/2024.  At that visit, her A1c was 12.1 (up from 7.7% prior). She was referred to me for further management. I saw her last month and restarted her insulin . We also got her access to Dexcom.   PMH is significant for CAD sp MI in 2021. Pt tells me today she had 3 stents placed. She also has a hx of T2DM, HTN, GERD, pancreatitis d/t Ozempic , HLD, peripheral neuropathy secondary to DM, chronic neck and back pain, arthritis, obesity, urinary incontinence.   Since her last visit, pt has done better. She brings her Dexcom in for review today. She is now adherent to her insulin  and continues to take metformin  and Farxiga  as prescribed.  Luckily, she now has been approved for Medicaid and is amenable to me sending rxns downstairs to our pharmacy.   Family/Social History:  Fhx: HTN, DM MI  Tobacco: never smoker  Alcohol: none reported   Current diabetes medications include: Farxiga  10 mg daily, Novolog  10 units TID before meals, Lantus  30 units once daily, metformin  500 mg XR, 2 tablets daily Patient endorses adherence to all medications.   Insurance coverage: [place] Medicaid  Patient denies hypoglycemic events.  Patient reports nocturia (nighttime urination).  Patient reports neuropathy (nerve pain). Patient denies visual changes. Patient reports self foot exams.   Patient reported dietary habits:  -Tries to follow a proper CM diet  Patient-reported exercise habits: none due to chronic pain  O:  Date of Download: 05/01/2024 Average Glucose: 202 mg/dL - Time in range 16-109: 20% - Time above range: 80%  Lab Results  Component Value Date   HGBA1C 12.1 (A) 03/29/2024   There were no vitals filed for this visit.  Lipid  Panel     Component Value Date/Time   CHOL 309 (H) 11/26/2023 1022   TRIG 119 11/26/2023 1022   HDL 74 11/26/2023 1022   CHOLHDL 4.2 11/26/2023 1022   CHOLHDL 4.4 10/09/2019 1000   VLDL 26 10/09/2019 1000   LDLCALC 214 (H) 11/26/2023 1022   LDLCALC 150 (H) 12/21/2018 1706   LDLDIRECT 94.0 09/25/2015 0956    Clinical Atherosclerotic Cardiovascular Disease (ASCVD): Yes  The ASCVD Risk score (Arnett DK, et al., 2019) failed to calculate for the following reasons:   Risk score cannot be calculated because patient has a medical history suggesting prior/existing ASCVD   Patient is participating in a Managed Medicaid Plan: No   A/P: Diabetes longstanding currently uncontrolled. Patient is not currently hypoglycemic but is able to verbalize appropriate hypoglycemia management plan. She has symptomatic hyperglycemia since being without insulin . We will restart her with a more 50/50 split basal/bolus regimen and titrate based on her CGM data in 1 month. Updated rxns for insulins and CGM supplies sent to our pharmacy. -Increase Lantus  to 36 units daily. -Increase Novolog  to 12 units TID before meals.  -Continue metformin  and Farxiga  at current doses.  -Patient educated on purpose, proper use, and potential adverse effects of insulin , Farxiga , and metformin .  -Extensively discussed pathophysiology of diabetes, recommended lifestyle interventions, dietary effects on blood sugar control.  -Counseled on s/sx of and management of hypoglycemia.  -Next A1c anticipated 05/2024.   Written patient instructions provided. Patient verbalized understanding of treatment plan.  Total time in face to face counseling 30 minutes.    Follow-up:  Pharmacist in 1 month.  Marene Shape, PharmD, Becky Bowels, CPP Clinical Pharmacist [facility] [phone]

## 2024-05-02 ENCOUNTER — Other Ambulatory Visit: Payer: Self-pay

## 2024-05-05 ENCOUNTER — Other Ambulatory Visit: Payer: Self-pay

## 2024-05-12 ENCOUNTER — Other Ambulatory Visit: Payer: Self-pay

## 2024-05-23 ENCOUNTER — Other Ambulatory Visit (HOSPITAL_COMMUNITY): Payer: Self-pay

## 2024-05-23 ENCOUNTER — Encounter: Payer: Self-pay | Admitting: Internal Medicine

## 2024-05-23 ENCOUNTER — Ambulatory Visit (INDEPENDENT_AMBULATORY_CARE_PROVIDER_SITE_OTHER): Admitting: Internal Medicine

## 2024-05-23 ENCOUNTER — Other Ambulatory Visit: Payer: Self-pay

## 2024-05-23 VITALS — BP 120/70 | HR 74 | Ht 64.0 in | Wt 182.0 lb

## 2024-05-23 DIAGNOSIS — E11319 Type 2 diabetes mellitus with unspecified diabetic retinopathy without macular edema: Secondary | ICD-10-CM | POA: Diagnosis not present

## 2024-05-23 DIAGNOSIS — Z794 Long term (current) use of insulin: Secondary | ICD-10-CM

## 2024-05-23 DIAGNOSIS — E663 Overweight: Secondary | ICD-10-CM

## 2024-05-23 DIAGNOSIS — E1169 Type 2 diabetes mellitus with other specified complication: Secondary | ICD-10-CM | POA: Diagnosis not present

## 2024-05-23 DIAGNOSIS — E785 Hyperlipidemia, unspecified: Secondary | ICD-10-CM

## 2024-05-23 MED ORDER — LANTUS SOLOSTAR 100 UNIT/ML ~~LOC~~ SOPN: 36.0000 [IU] | PEN_INJECTOR | Freq: Every day | SUBCUTANEOUS | 3 refills | Status: DC

## 2024-05-23 MED ORDER — NOVOLOG FLEXPEN 100 UNIT/ML ~~LOC~~ SOPN: 12.0000 [IU] | PEN_INJECTOR | Freq: Three times a day (TID) | SUBCUTANEOUS | 3 refills | Status: DC

## 2024-05-23 NOTE — Progress Notes (Signed)
 Patient ID: RENESSA WELLNITZ, female   DOB: [birth year], 63 y.o.   MRN: [id number]  HPI: JIMEKA BALAN is a 63 y.o.-year-old female, returning for f/u for DM2, dx ~2000, insulin -dependent since 2010, uncontrolled, with complications (PN, + DR). Last visit 5 months ago.  Interim history: No increased urination, blurry vision. She had diarrhea/stool incontinence at last visit but this improved after switching to metformin  ER. Since last visit, she relaxed her diet and restarted to drink sweet drinks.  She also did not wear the sensor consistently as it was coming off.  Reviewed HbA1c levels: Lab Results  Component Value Date   HGBA1C 12.1 (A) 03/29/2024   HGBA1C 7.7 (A) 11/22/2023   HGBA1C 14.4 (A) 08/23/2023   HGBA1C 8.6 (H) 06/01/2023   HGBA1C 6.2 (H) 02/15/2023   HGBA1C 6.2 (A) 10/26/2022   HGBA1C 7.8 (H) 07/04/2022   HGBA1C 6.5 (A) 04/10/2022   HGBA1C 7.7 (A) 11/10/2021   HGBA1C 7.3 (A) 01/24/2021   HGBA1C 7.1 (A) 09/13/2020   HGBA1C 6.4 (A) 05/13/2020   HGBA1C 7.6 (A) 08/31/2019   HGBA1C 7.8 (A) 11/25/2018   HGBA1C 6.8 (A) 07/25/2018   HGBA1C 6.4% 03/03/2018   HGBA1C 6.5 11/01/2017   HGBA1C 6.2 07/23/2017   HGBA1C 6.8 04/09/2017   HGBA1C 6.4 01/08/2017   HGBA1C 6.4 10/08/2016   HGBA1C 10.4 07/07/2016   HGBA1C 10.4 04/07/2016   HGBA1C 9.3 01/14/2016   HGBA1C 10.9 (H) 09/25/2015   HGBA1C 11.2 09/19/2015   HGBA1C 7.7 06/21/2015   HGBA1C 7.9 (H) 03/22/2015   HGBA1C 9.4 (H) 01/04/2015   HGBA1C 8.8 (H) 10/04/2014   Pt was on:  Insulin  Before breakfast Before lunch Before dinner  R 10 10 (6 units if active after the meal) 20-25  NPH 15 - 20  Also: - Metformin  1000 mg twice a day  Then on: - Metformin  2000 mg with dinner >> restarted 1000 mg with dinner - Farxiga  10 mg before b'fast - >> off  - Tresiba  U200 32 units daily  - FiAsp : 8-11 units before b'fast 8-11 units before lunch - missing 13-16 units before dinner - missing  I have recommended the following  regimen: - Metformin  1000 mg with dinner >> metformin  ER 1000 mg 2x a day - Farxiga  10 mg before b'fast - Tresiba  U200 32 >> 28 units daily >> Lantus  32 units - FiAsp  >> Novolog  12 units before each meal >>  Pt is checking her sugars >4x a day with her CGM:  Prev.:   Previously:  Lowest sugar was 29 (?) in 11/2020 >> ... 50s >> 40s >> 59 >> 69; she has hypoglycemia awareness in the 74s. Highest sugar was 280s >> 400s >> 300s >> 300s.  -No CKD, last BUN/creatinine:  Lab Results  Component Value Date   BUN 14 06/02/2023   CREATININE 0.56 06/02/2023   ACR was normal: Lab Results  Component Value Date   MICRALBCREAT NOTE 10/01/2023   MICRALBCREAT <4 11/12/2022   MICRALBCREAT 3 01/24/2021   MICRALBCREAT 1.8 06/27/2018   MICRALBCREAT 1.6 06/23/2017   MICRALBCREAT 0.7 06/19/2016   MICRALBCREAT 0.4 09/25/2015   MICRALBCREAT 0.3 07/30/2014   MICRALBCREAT 0.5 06/19/2013   MICRALBCREAT 1.5 01/31/2013  Not on ACE inhibitor/ARB  -+ HL; last set of lipids: Lab Results  Component Value Date   CHOL 309 (H) 11/26/2023   HDL 74 11/26/2023   LDLCALC 214 (H) 11/26/2023   LDLDIRECT 94.0 09/25/2015   TRIG 119 11/26/2023   CHOLHDL 4.2 11/26/2023  On Lipitor 40, prev. Also on Zetia  10.  - last eye exam was in 11/2022 reportedly: + DR OU, reportedly glaucoma. She sees Dr. Mavis.    - + numbness and tingling in her feet.  She is on Neurontin .  She sees podiatry.  Last foot exam: 03/27/2024 (Dr. Loreda).  She also has a history of HTN, episodic alcohol abuse, GERD, low back pain, OA.  She went to the emergency room 06/28/2021 for nausea, vomiting, and abdominal pain.  Her lipase was high.  Ozempic  was stopped. She was also in the ED post a fall down the stairs 08/2021. L forearm was in a splint >> saw ortho She had severe sciatic back pain.  She had nausea/vomiting/abdominal pain (06/2022) - was admitted.  Lipase level was normal.  It was considered that the symptoms were related to  smoking marijuana and also possibly her diabetes.   She had an almost identical admission in 05/2023.  Ozempic  was stopped, however, she was again smoking marijuana at that time.  ROS: + see HPI  I reviewed pt's medications, allergies, PMH, social hx, family hx, and changes were documented in the history of present illness. Otherwise, unchanged from my initial visit note.  Past Medical History:  Diagnosis Date   Arthritis    Campylobacter enteritis 03/2011   [facility] admission   Carpal tunnel syndrome, bilateral    Diabetes mellitus    Gastritis with bleeding due to alcohol 2010   [facility] admission   GERD (gastroesophageal reflux disease)    HIDRADENITIS SUPPURATIVA 04/11/2007   Qualifier: Diagnosis of   By: Celinda MD, Erle Clunes        Hyperlipidemia    Hypertension    no meds   Menopause    Myocardial infarction [facility])    Neck pain    Wears glasses    Past Surgical History:  Procedure Laterality Date   CARPAL TUNNEL RELEASE Left 05/14/2014   Procedure: LEFT ULNAR NEUROPLASTY AT ELBOW AND ENDOSCOPIC CARPAL TUNNEL RELEASE;  Surgeon: Alm DELENA Hummer, MD;  Location: [facility];  Service: Orthopedics;  Laterality: Left;   CORONARY STENT INTERVENTION N/A 10/09/2019   Procedure: CORONARY STENT INTERVENTION;  Surgeon: Ladona Heinz, MD;  Location: [facility];  Service: Cardiovascular;  Laterality: N/A;   CYST EXCISION  1995   under arms   endometrial biopsy  2004   benign   FOOT OSTEOTOMY  2010   right-spurs   LEFT HEART CATH AND CORONARY ANGIOGRAPHY N/A 10/09/2019   Procedure: LEFT HEART CATH AND CORONARY ANGIOGRAPHY;  Surgeon: Ladona Heinz, MD;  Location: [facility];  Service: Cardiovascular;  Laterality: N/A;   TONSILLECTOMY     TOTAL KNEE ARTHROPLASTY Left 02/26/2023   Procedure: LEFT TOTAL KNEE ARTHROPLASTY;  Surgeon: Vernetta Lonni GRADE, MD;  Location: [facility];  Service: Orthopedics;  Laterality: Left;  Needs RNFA   ULNAR NERVE TRANSPOSITION Left  05/14/2014   Procedure: ULNAR NERVE DECOMPRESSION/TRANSPOSITION;  Surgeon: Alm DELENA Hummer, MD;  Location: [facility];  Service: Orthopedics;  Laterality: Left;   Social History   Socioeconomic History   Marital status: Single    Spouse name: Not on file   Number of children: 2   Years of education: 55   Highest education level: 11th grade  Occupational History   Not on file  Tobacco Use   Smoking status: Never    Passive exposure: Past   Smokeless tobacco: Never  Vaping Use   Vaping status: Never  Used  Substance and Sexual Activity   Alcohol use: Not Currently   Drug use: Yes    Types: Marijuana    Comment: sometimes; last use on Tuesday, 07/20/23   Sexual activity: Yes    Birth control/protection: None    Comment: same partner x 10 years  Other Topics Concern   Not on file  Social History Narrative   She works as a Arboriculturist.   Right-handed.   One cup caffeine  per day.   She lives at home with daughter.   Social Drivers of Health   Financial Resource Strain: Medium Risk (03/22/2024)   Overall Financial Resource Strain (CARDIA)    Difficulty of Paying Living Expenses: Somewhat hard  Food Insecurity: Food Insecurity Present (03/22/2024)   Hunger Vital Sign    Worried About Running Out of Food in the Last Year: Sometimes true    Ran Out of Food in the Last Year: Sometimes true  Transportation Needs: No Transportation Needs (03/22/2024)   PRAPARE - Administrator, Civil Service (Medical): No    Lack of Transportation (Non-Medical): No  Physical Activity: Inactive (03/22/2024)   Exercise Vital Sign    Days of Exercise per Week: 0 days    Minutes of Exercise per Session: 0 min  Stress: Stress Concern Present (03/22/2024)   [employer] of Occupational Health - Occupational Stress Questionnaire    Feeling of Stress : To some extent  Social Connections: Moderately Integrated (03/22/2024)   Social Connection and Isolation Panel    Frequency of  Communication with Friends and Family: More than three times a week    Frequency of Social Gatherings with Friends and Family: Twice a week    Attends Religious Services: 1 to 4 times per year    Active Member of [employer] or Organizations: Yes    Attends Banker Meetings: 1 to 4 times per year    Marital Status: Never married  Intimate Partner Violence: Not At Risk (05/31/2023)   Humiliation, Afraid, Rape, and Kick questionnaire    Fear of Current or Ex-Partner: No    Emotionally Abused: No    Physically Abused: No    Sexually Abused: No   Current Outpatient Medications on File Prior to Visit  Medication Sig Dispense Refill   Ascorbic Acid (VITAMIN C PO) Take 1 tablet by mouth daily.     aspirin  81 MG chewable tablet Chew 1 tablet (81 mg total) by mouth 2 (two) times daily. 30 tablet 0   atorvastatin  (LIPITOR) 40 MG tablet Take 1 tablet (40 mg total) by mouth daily. 90 tablet 3   Blood Glucose Monitoring Suppl (ACCU-CHEK AVIVA PLUS) w/Device KIT Use to check blood sugar 2 times a day 1 kit 0   Continuous Glucose Receiver (DEXCOM G7 RECEIVER) DEVI Use to check blood glucose continuously. E11.65 1 each 0   cyanocobalamin  (VITAMIN B12) 1000 MCG tablet Take 1 tablet (1,000 mcg total) by mouth daily. 90 tablet 0   diclofenac  Sodium (VOLTAREN ) 1 % GEL Apply 4 g topically 4 (four) times daily. 350 g 1   ezetimibe  (ZETIA ) 10 MG tablet Take 1 tablet (10 mg total) by mouth daily. 90 tablet 3   FARXIGA  10 MG TABS tablet Take 1 tablet (10 mg total) by mouth daily. 90 tablet 0   gabapentin  (NEURONTIN ) 600 MG tablet Take 1 tablet (600 mg total) by mouth 3 (three) times daily. (Patient taking differently: Take 600 mg by mouth daily.) 90 tablet 5   glucose  blood (BAYER CONTOUR TEST) test strip TEST BLOOD SUGARS 3 TIMES DAILY 200 each 11   glucose blood test strip Use to check blood sugar 2 times a day 100 each 12   HYDROcodone -acetaminophen  (NORCO/VICODIN) 5-325 MG tablet Take 1 tablet by mouth  every 6 (six) hours as needed for moderate pain (pain score 4-6). 30 tablet 0   HYDROmorphone  (DILAUDID ) 2 MG tablet Take 2 mg by mouth as needed for severe pain.     hydrOXYzine  (ATARAX ) 25 MG tablet TAKE 1 TABLET BY MOUTH THREE TIMES DAILY AS NEEDED FOR  ITCHING 90 tablet 0   insulin  aspart (NOVOLOG  FLEXPEN) 100 UNIT/ML FlexPen Inject 12 Units into the skin 3 (three) times daily with meals. 15 mL 2   insulin  glargine (LANTUS  SOLOSTAR) 100 UNIT/ML Solostar Pen Inject 36 Units into the skin daily. 15 mL 2   Insulin  Pen Needle (PEN NEEDLES) 32G X 4 MM MISC Use four times a day with insulin . 400 each 2   Lancet Devices (EASY TOUCH LANCING DEVICE) MISC Use to check diabetes 4 times daily  E 11.65 1 each 11   latanoprost  (XALATAN ) 0.005 % ophthalmic solution Place 1 drop into both eyes at bedtime.     losartan  (COZAAR ) 25 MG tablet Take 1 tablet (25 mg total) by mouth every evening. 90 tablet 3   metFORMIN  (GLUCOPHAGE -XR) 500 MG 24 hr tablet Take 2 tablets (1,000 mg total) by mouth daily with supper. 180 tablet 3   methylPREDNISolone  (MEDROL  DOSEPAK) 4 MG TBPK tablet Use po daily as directed 21 tablet 0   metoCLOPramide  (REGLAN ) 10 MG tablet Take 1 tablet (10 mg total) by mouth every 8 (eight) hours as needed for nausea. 30 tablet 0   metoprolol  succinate (TOPROL -XL) 50 MG 24 hr tablet TAKE 1 TABLET BY MOUTH ONCE DAILY. TAKE  WITH  OR  IMMEDIATELY  FOLLOWING  A  MEAL 90 tablet 0   Na Sulfate-K Sulfate-Mg Sulf 17.5-3.13-1.6 GM/177ML SOLN Take by mouth.     naloxone  (NARCAN ) nasal spray 4 mg/0.1 mL Place 1 spray into the nose as needed (or).     naproxen  (NAPROSYN ) 500 MG tablet Take 1 tablet (500 mg total) by mouth 2 (two) times daily with a meal. 60 tablet 2   ondansetron  (ZOFRAN -ODT) 8 MG disintegrating tablet Take 1 tablet (8 mg total) by mouth every 8 (eight) hours as needed for nausea or vomiting. 10 tablet 0   pantoprazole  (PROTONIX ) 40 MG tablet Take 1 tablet by mouth twice daily 180 tablet 0    tiZANidine  (ZANAFLEX ) 4 MG tablet TAKE 1 TABLET BY MOUTH EVERY 6 HOURS AS NEEDED FOR MUSCLE SPASM 90 tablet 0   triamcinolone  cream (KENALOG ) 0.1 % Apply 1 Application topically daily.     No current facility-administered medications on file prior to visit.   Allergies  Allergen Reactions   Penicillins Rash   Family History  Problem Relation Age of Onset   Hypertension Mother    Diabetes Mother    Heart attack Mother    Stroke Father    Diabetes Father    Heart attack Sister    Diabetes Maternal Grandmother    Hypertension Maternal Grandmother    Colon cancer Neg Hx    Esophageal cancer Neg Hx    Rectal cancer Neg Hx    Stomach cancer Neg Hx    PE: BP 120/70   Pulse 74   Ht 5' 4 (1.626 m)   Wt 182 lb (82.6 kg)   SpO2  97%   BMI 31.24 kg/m    Wt Readings from Last 30 Encounters:  05/23/24 182 lb (82.6 kg)  04/11/24 173 lb 3.2 oz (78.6 kg)  03/29/24 168 lb (76.2 kg)  01/14/24 170 lb 3.2 oz (77.2 kg)  01/13/24 175 lb (79.4 kg)  01/04/24 168 lb 3.2 oz (76.3 kg)  12/31/23 172 lb 3.2 oz (78.1 kg)  12/02/23 169 lb 12.8 oz (77 kg)  11/26/23 167 lb (75.8 kg)  11/22/23 174 lb (78.9 kg)  10/26/23 162 lb 9.6 oz (73.8 kg)  10/01/23 162 lb 9.6 oz (73.8 kg)  08/31/23 153 lb 9.6 oz (69.7 kg)  08/23/23 153 lb (69.4 kg)  07/22/23 153 lb (69.4 kg)  06/22/23 153 lb 4 oz (69.5 kg)  06/11/23 150 lb 12.8 oz (68.4 kg)  06/02/23 154 lb 12.2 oz (70.2 kg)  05/25/23 158 lb (71.7 kg)  04/20/23 155 lb (70.3 kg)  02/26/23 174 lb 13.2 oz (79.3 kg)  02/25/23 166 lb (75.3 kg)  02/15/23 164 lb (74.4 kg)  02/11/23 165 lb (74.8 kg)  11/12/22 171 lb 3.2 oz (77.7 kg)  10/26/22 173 lb 3.2 oz (78.6 kg)  09/24/22 179 lb (81.2 kg)  09/09/22 188 lb (85.3 kg)  08/24/22 188 lb (85.3 kg)  07/16/22 190 lb 6.4 oz (86.4 kg)   Constitutional: overweight, walks with a cane, antalgic gait Eyes: no exophthalmos ENT: no masses palpated in neck, no cervical lymphadenopathy Cardiovascular: RRR, No  MRG Respiratory: CTA B Musculoskeletal: no deformities Skin: no rashes Neurological: no tremor with outstretched hands  ASSESSMENT: 1. DM2, insulin -dependent, uncontrolled, with complications - peripheral neuropathy - DR   2. Overweight  3. HL  PLAN:  1. Patient with longstanding, uncontrolled, type 2 diabetes, on metformin , SGLT2 inhibitor and basal-bolus insulin  last year, to an HbA1c of 14.4%, when she had to come off Ozempic  due to nausea, vomiting, abdominal pain (with normal lipase).  She continues off Ozempic .  At last visit, however, HbA1c was better, at 7.7%.  However, she had another HbA1c obtained 2 months ago and this was again much higher, at 12.1%. - At last visit, sugars were quite fluctuating with improving values overnight but then increasing significantly after breakfast.  Many times, she was dropping her blood sugars after breakfast, consistent with taking Fiasp  too late.  I advised her to always take it before the meal but to take a lower dose if she forgot it before the meal and had to take it after meal.  We did not change insulin  doses otherwise.  I did advise her to switch to metformin  ER as she describes diarrhea and stool incontinence.  I also strongly recommended to schedule an appointment with the nutritionist.  She did not have this appointment.  Moreover, she relaxed her diet since last visit and also restarted to drink sodas.  We discussed that she absolutely cannot drink this, states every time she restarts, her HbA1c increasing significantly.  She is determined to stop them.  She also needs to improve the rest of her diet.  Otherwise, I do not feel we absolutely need to change her regimen.  She is taking higher doses of insulin  compared to last visit, and we will continue these.  I did advise her to vary the dose of NovoLog  depending on the size of her meals. - I advised her to: Patient Instructions  Please continue: - Metformin  ER 1000 mg 2x a day - Farxiga  10  mg before b'fast - Lantus  32 units daily -  Novolog  12 units 3x day  Take NovoLog  before meals! If you need to take it after a meal, may need a lower dose.  NO SWEET DRINKS!    Please return in 3 months.  - advised to check sugars at different times of the day - 4x a day, rotating check times - advised for yearly eye exams >> she is UTD - return to clinic in 3-4 months  2. Overweight -We tried Ozempic  in the past but she had to come off due to nausea/vomiting/abdominal pain and a high lipase in 05/2021.  Afterwards, she really wanted to restart this again.  We discussed about possible side effects and possible alarm symptoms and we started her back on a low-dose of Ozempic .  However, she was admitted in 05/2023 with intractable nausea/vomiting/abdominal pain (at that time, she also reported daily marijuana use - was advised to stop).  She came off Ozempic  at that time. - Before last year, she lost approximately 40 pounds, possibly due to glucotoxicity - She gained approximately 20 pounds back afterwards - Since last visit, she gained another 13 pounds  3. HL - Latest lipid panel was reviewed from 10/2023: Extremely high LDL, otherwise fractions at goal: Lab Results  Component Value Date   CHOL 309 (H) 11/26/2023   HDL 74 11/26/2023   LDLCALC 214 (H) 11/26/2023   LDLDIRECT 94.0 09/25/2015   TRIG 119 11/26/2023   CHOLHDL 4.2 11/26/2023  - She is on Lipitor 40 mg daily and Zetia  10 mg daily, which I advised her to restart after her results return in 01/2023.  Lela Fendt, MD PhD [facility] Endocrinology

## 2024-05-23 NOTE — Patient Instructions (Addendum)
 Please continue: - Metformin  ER 1000 mg 2x a day - Farxiga  10 mg before b'fast - Lantus  32 units daily - Novolog  12 units 3x day  Take NovoLog  before meals! If you need to take it after a meal, may need a lower dose.  NO SWEET DRINKS!    Please return in 3 months.

## 2024-05-24 ENCOUNTER — Telehealth: Payer: Self-pay | Admitting: Family Medicine

## 2024-05-24 NOTE — Telephone Encounter (Signed)
 Patient was identified as falling into the [place] Measure - Diabetes.   Patient was: Appointment scheduled with primary care provider in the next 30 days. With [facility] for med management and also last A1c was done 03/29/2024

## 2024-05-26 ENCOUNTER — Ambulatory Visit: Payer: BC Managed Care – PPO | Admitting: Cardiology

## 2024-06-12 ENCOUNTER — Ambulatory Visit: Attending: Cardiology | Admitting: Cardiology

## 2024-06-12 NOTE — Progress Notes (Deleted)
 Cardiology Office Note:  .   Date:  06/12/2024  ID:  [name], DOB 13-Nov-1961, MRN [id number] PCP: Tanda Bleacher, MD  [facility] Providers Cardiologist:  Gordy Bergamo, MD { Click to update primary MD,subspecialty MD or APP then REFRESH:1}  History of Present Illness: .   [name] is a 63 y.o. AAF  with diabetes mellitus, hyperlipidemia, prior cigarette smoking quit in 2013, smokes marijuana occasionally, CAD  Presenting with NSTEMI, S/P stenting to mid right and also to the distal LAD by implantation of DES on 10/09/2019.  Her main issue continues to be knee pain with difficulty walking and has started using a walker over the past 6 to 8 months.  Discussed the use of AI scribe software for clinical note transcription with the patient, who gave verbal consent to proceed.  History of Present Illness    Labs   Lab Results  Component Value Date   CHOL 309 (H) 11/26/2023   HDL 74 11/26/2023   LDLCALC 214 (H) 11/26/2023   LDLDIRECT 94.0 09/25/2015   TRIG 119 11/26/2023   CHOLHDL 4.2 11/26/2023   Lipoprotein (a)  Date/Time Value Ref Range Status  12/08/2019 08:26 AM 127.7 (H) <75.0 nmol/L Final    Comment:    Note:  Values greater than or equal to 75.0 nmol/L may        indicate an independent risk factor for CHD,        but must be evaluated with caution when applied        to non-Caucasian populations due to the        influence of genetic factors on Lp(a) across        ethnicities.     Lab Results  Component Value Date   NA 136 06/02/2023   K 3.7 06/02/2023   CO2 27 06/02/2023   GLUCOSE 140 (H) 06/02/2023   BUN 14 06/02/2023   CREATININE 0.56 06/02/2023   CALCIUM  8.8 (L) 06/02/2023   GFR 122.79 07/22/2017   EGFR 106 04/20/2023   GFRNONAA >60 06/02/2023      Latest Ref Rng & Units 06/02/2023    6:27 AM 06/01/2023    6:09 AM 05/31/2023   11:40 AM  BMP  Glucose 70 - 99 mg/dL 859  796  819   BUN 8 - 23 mg/dL 14  18  15    Creatinine 0.44 - 1.00 mg/dL  9.43  9.32  9.35   Sodium 135 - 145 mmol/L 136  133  140   Potassium 3.5 - 5.1 mmol/L 3.7  4.2  3.4   Chloride 98 - 111 mmol/L 99  96  101   CO2 22 - 32 mmol/L 27  26  21    Calcium  8.9 - 10.3 mg/dL 8.8  8.9  9.8       Latest Ref Rng & Units 06/02/2023    6:27 AM 06/01/2023    6:09 AM 05/31/2023   11:40 AM  CBC  WBC 4.0 - 10.5 K/uL 7.5  11.7  15.0   Hemoglobin 12.0 - 15.0 g/dL 87.1  87.0  84.1   Hematocrit 36.0 - 46.0 % 41.5  42.3  50.7   Platelets 150 - 400 K/uL 321  369  418    Lab Results  Component Value Date   HGBA1C 12.1 (A) 03/29/2024    Lab Results  Component Value Date   TSH 0.903 06/01/2023    External Labs:  ***  ROS  ***ROS Physical Exam:  VS:  There were no vitals taken for this visit.   Wt Readings from Last 3 Encounters:  05/23/24 182 lb (82.6 kg)  04/11/24 173 lb 3.2 oz (78.6 kg)  03/29/24 168 lb (76.2 kg)    ***Physical Exam Studies Reviewed: SABRA    Left heart catheterization 10/09/2019: RCA: 2.5 x 38 and a 2.5 x 8 mm resolute Onyx DES stents implanted. Stenosis reduced to 0% LAD  2.25 x 15 mm resolute Onyx DES postdilated with 2.25 x 8 mm [place] at 22 atmospheric pressure with 0% residual stenosis     Lower Extremity Arterial Duplex 04/20/2024: Right: 30-49% stenosis noted in the peroneal artery. Mild to moderate  atherosclerosis noted throughout extremity.   Left: 30-49% stenosis noted in the superficial femoral artery. 30-49%  stenosis noted in the peroneal artery. Mild to moderate atherosclerosis noted throughout extremity.   Right: Resting right ankle-brachial index is within normal range at 1.23 with mildly abnormal biphasic waveform pattern at the ankle. The right toe-brachial index is normal.   Left: The left toe-brachial index is abnormal. Although ankle brachial indices are within normal limits (0.95-1.29), with severely abnormal monophasic arterial Doppler waveforms at the ankle suggest some component of arterial occlusive disease.    EKG:         Medications ordered    No orders of the defined types were placed in this encounter.    ASSESSMENT AND PLAN: .    No diagnosis found.  Assessment and Plan Assessment & Plan      Signed,  Gordy Bergamo, MD, [facility] 06/12/2024, 7:12 AM [facility] [address] [place], [place] [address] Phone: [phone]. Fax:  [phone]

## 2024-06-13 ENCOUNTER — Encounter: Payer: Self-pay | Admitting: Cardiology

## 2024-06-23 ENCOUNTER — Telehealth: Payer: Self-pay | Admitting: Family Medicine

## 2024-06-23 NOTE — Telephone Encounter (Signed)
 Called pt to confirm appt for 7/28 LVM

## 2024-06-26 ENCOUNTER — Ambulatory Visit (INDEPENDENT_AMBULATORY_CARE_PROVIDER_SITE_OTHER): Admitting: Podiatry

## 2024-06-26 ENCOUNTER — Ambulatory Visit: Payer: Self-pay | Attending: Family Medicine | Admitting: Pharmacist

## 2024-06-26 ENCOUNTER — Encounter: Payer: Self-pay | Admitting: Pharmacist

## 2024-06-26 ENCOUNTER — Other Ambulatory Visit: Payer: Self-pay

## 2024-06-26 ENCOUNTER — Encounter: Payer: Self-pay | Admitting: Podiatry

## 2024-06-26 DIAGNOSIS — M79675 Pain in left toe(s): Secondary | ICD-10-CM

## 2024-06-26 DIAGNOSIS — Z7984 Long term (current) use of oral hypoglycemic drugs: Secondary | ICD-10-CM

## 2024-06-26 DIAGNOSIS — Z794 Long term (current) use of insulin: Secondary | ICD-10-CM | POA: Diagnosis not present

## 2024-06-26 DIAGNOSIS — E1165 Type 2 diabetes mellitus with hyperglycemia: Secondary | ICD-10-CM | POA: Diagnosis not present

## 2024-06-26 DIAGNOSIS — B351 Tinea unguium: Secondary | ICD-10-CM | POA: Diagnosis not present

## 2024-06-26 DIAGNOSIS — M79674 Pain in right toe(s): Secondary | ICD-10-CM

## 2024-06-26 DIAGNOSIS — E1140 Type 2 diabetes mellitus with diabetic neuropathy, unspecified: Secondary | ICD-10-CM

## 2024-06-26 LAB — POCT GLYCOSYLATED HEMOGLOBIN (HGB A1C): HbA1c, POC (controlled diabetic range): 8.5 % — AB (ref 0.0–7.0)

## 2024-06-26 MED ORDER — FREESTYLE LIBRE 3 PLUS SENSOR MISC
6 refills | Status: DC
  Filled 2024-06-26 – 2024-06-27 (×3): qty 2, 30d supply, fill #0
  Filled 2024-07-24: qty 2, 30d supply, fill #1
  Filled 2024-09-04: qty 2, 30d supply, fill #2
  Filled 2024-09-13 – 2024-09-27 (×2): qty 2, 30d supply, fill #3

## 2024-06-26 MED ORDER — FREESTYLE LIBRE 3 READER DEVI
0 refills | Status: AC
  Filled 2024-06-26: qty 1, 365d supply, fill #0

## 2024-06-26 NOTE — Progress Notes (Signed)
 This patient returns to my office for at risk foot care.  This patient requires this care by a professional since this patient will be at risk due to having diabetic neuropathy.  This patient is unable to cut nails herself since the patient cannot reach her nails.These nails are painful walking and wearing shoes.  This patient presents for at risk foot care today.  General Appearance  Alert, conversant and in no acute stress.  Vascular  Dorsalis pedis and posterior tibial  pulses are palpable  bilaterally.  Capillary return is within normal limits  bilaterally. Temperature is within normal limits  bilaterally.  Neurologic  Senn-Weinstein monofilament wire test diminished  bilaterally. Muscle power within normal limits bilaterally.  Nails Thick disfigured discolored nails with subungual debris  from hallux to fifth toes bilaterally. No evidence of bacterial infection or drainage bilaterally.  Orthopedic  No limitations of motion  feet .  No crepitus or effusions noted.  No bony pathology or digital deformities noted.  Skin  normotropic skin with no porokeratosis noted bilaterally.  No signs of infections or ulcers noted.     Onychomycosis  Pain in right toes  Pain in left toes  Consent was obtained for treatment procedures.   Mechanical debridement of nails 1-5  bilaterally performed with a nail nipper.  Filed with dremel without incident.    Return office visit   9 weeks                  Told patient to return for periodic foot care and evaluation due to potential at risk complications.   Helane Gunther DPM

## 2024-06-26 NOTE — Addendum Note (Signed)
 Addended by: FLEETA MORRIS, GARNETTE L on: 06/26/2024 04:24 PM   Modules accepted: Orders

## 2024-06-26 NOTE — Progress Notes (Signed)
 S:     No chief complaint on file.  63 y.o. female who presents for diabetes evaluation, education, and management. Patient arrives in good spirits and presents without any assistance.   Patient was referred and last seen by Primary Care Provider, Dr. Tanda, on 04/11/2024. I've seen her a couple of times since May. Over the course of those visits, I started her on basal + bolus insulin  and adjusted those visit.   Since then, patient was seen by Endocrinology (Dr. Trixie) on 05/23/2024. She advised her to adjust her insulin  to better balance managing her DM while also avoiding hypoglycemia.   PMH is significant for CAD sp MI in 2021. Pt tells me today she had 3 stents placed. She also has a hx of T2DM, HTN, GERD, pancreatitis d/t Ozempic , HLD, peripheral neuropathy secondary to DM, chronic neck and back pain, arthritis, obesity, urinary incontinence.   Since her last visit with Endocrine, pt has done better. She brings her Dexcom in for review today. She is now adherent to her insulin  and continues to take metformin  and Farxiga  as prescribed.  Family/Social History:  Fhx: HTN, DM MI  Tobacco: never smoker  Alcohol: none reported   Current diabetes medications include: Farxiga  10 mg daily, Novolog  12 units TID before meals, Lantus  32 units once daily, metformin  500 mg XR, 2 tablets daily Patient endorses adherence to all medications.   Insurance coverage: [place] [employer]  Patient denies hypoglycemic events.  Patient denies nocturia (nighttime urination).  Patient denies neuropathy (nerve pain). Patient denies visual changes. Patient reports self foot exams.   Patient reported dietary habits:  -Tries to follow a proper CM diet  Patient-reported exercise habits: none due to chronic pain  O:  Date of Download: 06/26/2024, 4 week report Average Glucose: 165 mg/dL - Time in range 29-819: 64% - Time above range: 34%  Lab Results  Component Value Date   HGBA1C 12.1 (A) 03/29/2024    There were no vitals filed for this visit.  Lipid Panel     Component Value Date/Time   CHOL 309 (H) 11/26/2023 1022   TRIG 119 11/26/2023 1022   HDL 74 11/26/2023 1022   CHOLHDL 4.2 11/26/2023 1022   CHOLHDL 4.4 10/09/2019 1000   VLDL 26 10/09/2019 1000   LDLCALC 214 (H) 11/26/2023 1022   LDLCALC 150 (H) 12/21/2018 1706   LDLDIRECT 94.0 09/25/2015 0956    Clinical Atherosclerotic Cardiovascular Disease (ASCVD): Yes  The ASCVD Risk score (Arnett DK, et al., 2019) failed to calculate for the following reasons:   Risk score cannot be calculated because patient has a medical history suggesting prior/existing ASCVD   Patient is participating in a Managed Medicaid Plan: No   A/P: Diabetes longstanding currently above goal but improving. A1c today is 8.5, down from 12.1 in April of this year. Commended her for this! Patient is not currently hypoglycemic but is able to verbalize appropriate hypoglycemia management plan. Her symptomatic hyperglycemia has improved since restarting insulin . Her CGM report shows good improvement. While not at goal, I would like to continue her current regiment and reevaluate in 1 month.  -Continue Lantus  36 units daily. -Continue Novolog  12 units TID before meals.  -Continue metformin  and Farxiga  at current doses.  -Patient educated on purpose, proper use, and potential adverse effects of insulin , Farxiga , and metformin .  -Extensively discussed pathophysiology of diabetes, recommended lifestyle interventions, dietary effects on blood sugar control.  -Counseled on s/sx of and management of hypoglycemia.  -Next A1c anticipated  08/2024.   Written patient instructions provided. Patient verbalized understanding of treatment plan.  Total time in face to face counseling 30 minutes.    Follow-up:  Pharmacist in 1 month.  Herlene Fleeta Morris, PharmD, JAQUELINE, CPP Clinical Pharmacist [facility] [phone]

## 2024-06-27 ENCOUNTER — Other Ambulatory Visit: Payer: Self-pay

## 2024-06-27 LAB — CMP14+EGFR
ALT: 20 IU/L (ref 0–32)
AST: 22 IU/L (ref 0–40)
Albumin: 4.2 g/dL (ref 3.9–4.9)
Alkaline Phosphatase: 107 IU/L (ref 44–121)
BUN/Creatinine Ratio: 18 (ref 12–28)
BUN: 12 mg/dL (ref 8–27)
Bilirubin Total: 0.3 mg/dL (ref 0.0–1.2)
CO2: 23 mmol/L (ref 20–29)
Calcium: 9 mg/dL (ref 8.7–10.3)
Chloride: 103 mmol/L (ref 96–106)
Creatinine, Ser: 0.68 mg/dL (ref 0.57–1.00)
Globulin, Total: 2.4 g/dL (ref 1.5–4.5)
Glucose: 252 mg/dL — ABNORMAL HIGH (ref 70–99)
Potassium: 3.9 mmol/L (ref 3.5–5.2)
Sodium: 140 mmol/L (ref 134–144)
Total Protein: 6.6 g/dL (ref 6.0–8.5)
eGFR: 97 mL/min/1.73 (ref >59)

## 2024-06-27 LAB — LIPID PANEL
Chol/HDL Ratio: 2.7 ratio (ref 0.0–4.4)
Cholesterol, Total: 115 mg/dL (ref 100–199)
HDL: 43 mg/dL (ref >39)
LDL Chol Calc (NIH): 47 mg/dL (ref 0–99)
Triglycerides: 145 mg/dL (ref 0–149)
VLDL Cholesterol Cal: 25 mg/dL (ref 5–40)

## 2024-07-04 ENCOUNTER — Ambulatory Visit (INDEPENDENT_AMBULATORY_CARE_PROVIDER_SITE_OTHER): Admitting: Family Medicine

## 2024-07-04 ENCOUNTER — Encounter: Payer: Self-pay | Admitting: Family Medicine

## 2024-07-04 VITALS — BP 128/83 | HR 66 | Ht 64.0 in | Wt 181.6 lb

## 2024-07-04 DIAGNOSIS — M79602 Pain in left arm: Secondary | ICD-10-CM

## 2024-07-04 DIAGNOSIS — M79601 Pain in right arm: Secondary | ICD-10-CM | POA: Diagnosis not present

## 2024-07-04 MED ORDER — DICLOFENAC SODIUM 1 % EX GEL: 4.0000 g | Freq: Four times a day (QID) | CUTANEOUS | 1 refills | Status: DC

## 2024-07-05 NOTE — Progress Notes (Unsigned)
 Established Patient Office Visit  Subjective    Patient ID: [name], female    DOB: 1961/07/11  Age: 63 y.o. MRN: [id number]  CC:  Chief Complaint  Patient presents with   Medical Management of Chronic Issues    Pt is concerned about how her arms are feeling     HPI [name] presents with intermittent arm pain bilaterally. Patient denies known trauma or injury. It does not interfere with her daily activities.   Outpatient Encounter Medications as of 07/04/2024  Medication Sig   Ascorbic Acid (VITAMIN C PO) Take 1 tablet by mouth daily.   aspirin  81 MG chewable tablet Chew 1 tablet (81 mg total) by mouth 2 (two) times daily.   atorvastatin  (LIPITOR) 40 MG tablet Take 1 tablet (40 mg total) by mouth daily.   Blood Glucose Monitoring Suppl (ACCU-CHEK AVIVA PLUS) w/Device KIT Use to check blood sugar 2 times a day   Continuous Glucose Receiver (FREESTYLE LIBRE 3 READER) DEVI Use to check blood sugar continuously throughout. Change sensor every 15 days.   Continuous Glucose Sensor (FREESTYLE LIBRE 3 PLUS SENSOR) MISC Use to check blood sugar continuously throughout. Change sensor every 15 days.   cyanocobalamin  (VITAMIN B12) 1000 MCG tablet Take 1 tablet (1,000 mcg total) by mouth daily.   ezetimibe  (ZETIA ) 10 MG tablet Take 1 tablet (10 mg total) by mouth daily.   FARXIGA  10 MG TABS tablet Take 1 tablet (10 mg total) by mouth daily.   gabapentin  (NEURONTIN ) 600 MG tablet Take 1 tablet (600 mg total) by mouth 3 (three) times daily. (Patient taking differently: Take 600 mg by mouth daily.)   glucose blood (BAYER CONTOUR TEST) test strip TEST BLOOD SUGARS 3 TIMES DAILY   glucose blood test strip Use to check blood sugar 2 times a day   HYDROcodone -acetaminophen  (NORCO/VICODIN) 5-325 MG tablet Take 1 tablet by mouth every 6 (six) hours as needed for moderate pain (pain score 4-6).   HYDROmorphone  (DILAUDID ) 2 MG tablet Take 2 mg by mouth as needed for severe pain.   hydrOXYzine   (ATARAX ) 25 MG tablet TAKE 1 TABLET BY MOUTH THREE TIMES DAILY AS NEEDED FOR  ITCHING   insulin  aspart (NOVOLOG  FLEXPEN) 100 UNIT/ML FlexPen Inject 12 Units into the skin 3 (three) times daily with meals.   insulin  glargine (LANTUS  SOLOSTAR) 100 UNIT/ML Solostar Pen Inject 36 Units into the skin daily.   Insulin  Pen Needle (PEN NEEDLES) 32G X 4 MM MISC Use four times a day with insulin .   Lancet Devices (EASY TOUCH LANCING DEVICE) MISC Use to check diabetes 4 times daily  E 11.65   latanoprost  (XALATAN ) 0.005 % ophthalmic solution Place 1 drop into both eyes at bedtime.   losartan  (COZAAR ) 25 MG tablet Take 1 tablet (25 mg total) by mouth every evening.   metFORMIN  (GLUCOPHAGE -XR) 500 MG 24 hr tablet Take 2 tablets (1,000 mg total) by mouth daily with supper.   methylPREDNISolone  (MEDROL  DOSEPAK) 4 MG TBPK tablet Use po daily as directed   metoCLOPramide  (REGLAN ) 10 MG tablet Take 1 tablet (10 mg total) by mouth every 8 (eight) hours as needed for nausea.   metoprolol  succinate (TOPROL -XL) 50 MG 24 hr tablet TAKE 1 TABLET BY MOUTH ONCE DAILY. TAKE  WITH  OR  IMMEDIATELY  FOLLOWING  A  MEAL   Na Sulfate-K Sulfate-Mg Sulf 17.5-3.13-1.6 GM/177ML SOLN Take by mouth.   naloxone  (NARCAN ) nasal spray 4 mg/0.1 mL Place 1 spray into the nose as  needed (or).   naproxen  (NAPROSYN ) 500 MG tablet Take 1 tablet (500 mg total) by mouth 2 (two) times daily with a meal.   ondansetron  (ZOFRAN -ODT) 8 MG disintegrating tablet Take 1 tablet (8 mg total) by mouth every 8 (eight) hours as needed for nausea or vomiting.   pantoprazole  (PROTONIX ) 40 MG tablet Take 1 tablet by mouth twice daily   tiZANidine  (ZANAFLEX ) 4 MG tablet TAKE 1 TABLET BY MOUTH EVERY 6 HOURS AS NEEDED FOR MUSCLE SPASM   triamcinolone  cream (KENALOG ) 0.1 % Apply 1 Application topically daily.   [DISCONTINUED] diclofenac  Sodium (VOLTAREN ) 1 % GEL Apply 4 g topically 4 (four) times daily.   diclofenac  Sodium (VOLTAREN ) 1 % GEL Apply 4 g topically 4  (four) times daily.   No facility-administered encounter medications on file as of 07/04/2024.    Past Medical History:  Diagnosis Date   Arthritis    Campylobacter enteritis 03/2011   [facility] admission   Carpal tunnel syndrome, bilateral    Diabetes mellitus    Gastritis with bleeding due to alcohol 2010   [facility] admission   GERD (gastroesophageal reflux disease)    HIDRADENITIS SUPPURATIVA 04/11/2007   Qualifier: Diagnosis of   By: Celinda MD, Erle Clunes        Hyperlipidemia    Hypertension    no meds   Menopause    Myocardial infarction [facility])    Neck pain    Wears glasses     Past Surgical History:  Procedure Laterality Date   CARPAL TUNNEL RELEASE Left 05/14/2014   Procedure: LEFT ULNAR NEUROPLASTY AT ELBOW AND ENDOSCOPIC CARPAL TUNNEL RELEASE;  Surgeon: Alm DELENA Hummer, MD;  Location: [facility];  Service: Orthopedics;  Laterality: Left;   CORONARY STENT INTERVENTION N/A 10/09/2019   Procedure: CORONARY STENT INTERVENTION;  Surgeon: Ladona Heinz, MD;  [facility];  Service: Cardiovascular;  Laterality: N/A;   CYST EXCISION  1995   under arms   endometrial biopsy  2004   benign   FOOT OSTEOTOMY  2010   right-spurs   LEFT HEART CATH AND CORONARY ANGIOGRAPHY N/A 10/09/2019   Procedure: LEFT HEART CATH AND CORONARY ANGIOGRAPHY;  Surgeon: Ladona Heinz, MD;  Location: [facility];  Service: Cardiovascular;  Laterality: N/A;   TONSILLECTOMY     TOTAL KNEE ARTHROPLASTY Left 02/26/2023   Procedure: LEFT TOTAL KNEE ARTHROPLASTY;  Surgeon: Vernetta Lonni GRADE, MD;  Location: [facility];  Service: Orthopedics;  Laterality: Left;  Needs RNFA   ULNAR NERVE TRANSPOSITION Left 05/14/2014   Procedure: ULNAR NERVE DECOMPRESSION/TRANSPOSITION;  Surgeon: Alm DELENA Hummer, MD;  Location: [facility];  Service: Orthopedics;  Laterality: Left;    Family History  Problem Relation Age of Onset   Hypertension Mother    Diabetes Mother    Heart  attack Mother    Stroke Father    Diabetes Father    Heart attack Sister    Diabetes Maternal Grandmother    Hypertension Maternal Grandmother    Colon cancer Neg Hx    Esophageal cancer Neg Hx    Rectal cancer Neg Hx    Stomach cancer Neg Hx     Social History   Socioeconomic History   Marital status: Single    Spouse name: Not on file   Number of children: 2   Years of education: 12   Highest education level: 11th grade  Occupational History   Not on file  Tobacco Use   Smoking status:  Never    Passive exposure: Past   Smokeless tobacco: Never  Vaping Use   Vaping status: Never Used  Substance and Sexual Activity   Alcohol use: Not Currently   Drug use: Yes    Types: Marijuana    Comment: sometimes; last use on Tuesday, 07/20/23   Sexual activity: Yes    Birth control/protection: None    Comment: same partner x 10 years  Other Topics Concern   Not on file  Social History Narrative   She works as a Arboriculturist.   Right-handed.   One cup caffeine  per day.   She lives at home with daughter.   Social Drivers of Health   Financial Resource Strain: Medium Risk (03/22/2024)   Overall Financial Resource Strain (CARDIA)    Difficulty of Paying Living Expenses: Somewhat hard  Food Insecurity: Food Insecurity Present (03/22/2024)   Hunger Vital Sign    Worried About Running Out of Food in the Last Year: Sometimes true    Ran Out of Food in the Last Year: Sometimes true  Transportation Needs: No Transportation Needs (03/22/2024)   PRAPARE - Administrator, Civil Service (Medical): No    Lack of Transportation (Non-Medical): No  Physical Activity: Inactive (03/22/2024)   Exercise Vital Sign    Days of Exercise per Week: 0 days    Minutes of Exercise per Session: 0 min  Stress: Stress Concern Present (03/22/2024)   [employer] of Occupational Health - Occupational Stress Questionnaire    Feeling of Stress : To some extent  Social Connections: Moderately  Integrated (03/22/2024)   Social Connection and Isolation Panel    Frequency of Communication with Friends and Family: More than three times a week    Frequency of Social Gatherings with Friends and Family: Twice a week    Attends Religious Services: 1 to 4 times per year    Active Member of [employer] or Organizations: Yes    Attends Banker Meetings: 1 to 4 times per year    Marital Status: Never married  Intimate Partner Violence: Not At Risk (05/31/2023)   Humiliation, Afraid, Rape, and Kick questionnaire    Fear of Current or Ex-Partner: No    Emotionally Abused: No    Physically Abused: No    Sexually Abused: No    Review of Systems  All other systems reviewed and are negative.       Objective    BP 128/83   Pulse 66   Ht 5' 4 (1.626 m)   Wt 181 lb 9.6 oz (82.4 kg)   SpO2 97%   BMI 31.17 kg/m   Physical Exam Vitals and nursing note reviewed.  Constitutional:      General: She is not in acute distress. Cardiovascular:     Rate and Rhythm: Normal rate and regular rhythm.  Pulmonary:     Effort: Pulmonary effort is normal.     Breath sounds: Normal breath sounds.  Musculoskeletal:     Right shoulder: Normal.     Left shoulder: Normal.     Right upper arm: Normal.     Left upper arm: Normal.  Neurological:     General: No focal deficit present.     Mental Status: She is alert and oriented to person, place, and time.         Assessment & Plan:   Bilateral arm pain  Other orders -     Diclofenac  Sodium; Apply 4 g topically 4 (four) times daily.  Dispense: 350 g; Refill: 1     No follow-ups on file.   Tanda Raguel SQUIBB, MD

## 2024-07-06 ENCOUNTER — Ambulatory Visit: Payer: Self-pay | Admitting: Family Medicine

## 2024-07-06 ENCOUNTER — Encounter: Payer: Self-pay | Admitting: Family Medicine

## 2024-07-17 ENCOUNTER — Ambulatory Visit: Payer: Self-pay | Admitting: *Deleted

## 2024-07-17 ENCOUNTER — Encounter: Payer: Self-pay | Admitting: Family Medicine

## 2024-07-17 ENCOUNTER — Ambulatory Visit (INDEPENDENT_AMBULATORY_CARE_PROVIDER_SITE_OTHER): Admitting: Family Medicine

## 2024-07-17 VITALS — BP 156/78 | HR 72 | Ht 64.0 in | Wt 186.4 lb

## 2024-07-17 DIAGNOSIS — M79671 Pain in right foot: Secondary | ICD-10-CM | POA: Diagnosis not present

## 2024-07-17 DIAGNOSIS — M7730 Calcaneal spur, unspecified foot: Secondary | ICD-10-CM | POA: Diagnosis not present

## 2024-07-17 DIAGNOSIS — M79672 Pain in left foot: Secondary | ICD-10-CM | POA: Diagnosis not present

## 2024-07-17 NOTE — Progress Notes (Signed)
 Established Patient Office Visit  Subjective    Patient ID: [name], female    DOB: 02/19/1961  Age: 63 y.o. MRN: [id number]  CC:  Chief Complaint  Patient presents with   Foot Injury    Pt brought her x ray reports - stated she has arthritis and heel spurts     HPI [name] presents for follow up of UC visit where she was seen after stepping through a ladder rung and injuring her left foot.   Outpatient Encounter Medications as of 07/17/2024  Medication Sig   Ascorbic Acid (VITAMIN C PO) Take 1 tablet by mouth daily.   aspirin  81 MG chewable tablet Chew 1 tablet (81 mg total) by mouth 2 (two) times daily.   atorvastatin  (LIPITOR) 40 MG tablet Take 1 tablet (40 mg total) by mouth daily.   Blood Glucose Monitoring Suppl (ACCU-CHEK AVIVA PLUS) w/Device KIT Use to check blood sugar 2 times a day   Continuous Glucose Receiver (FREESTYLE LIBRE 3 READER) DEVI Use to check blood sugar continuously throughout. Change sensor every 15 days.   Continuous Glucose Sensor (FREESTYLE LIBRE 3 PLUS SENSOR) MISC Use to check blood sugar continuously throughout. Change sensor every 15 days.   cyanocobalamin  (VITAMIN B12) 1000 MCG tablet Take 1 tablet (1,000 mcg total) by mouth daily.   diclofenac  Sodium (VOLTAREN ) 1 % GEL Apply 4 g topically 4 (four) times daily.   ezetimibe  (ZETIA ) 10 MG tablet Take 1 tablet (10 mg total) by mouth daily.   FARXIGA  10 MG TABS tablet Take 1 tablet (10 mg total) by mouth daily.   gabapentin  (NEURONTIN ) 600 MG tablet Take 1 tablet (600 mg total) by mouth 3 (three) times daily. (Patient taking differently: Take 600 mg by mouth daily.)   glucose blood (BAYER CONTOUR TEST) test strip TEST BLOOD SUGARS 3 TIMES DAILY   glucose blood test strip Use to check blood sugar 2 times a day   HYDROcodone -acetaminophen  (NORCO/VICODIN) 5-325 MG tablet Take 1 tablet by mouth every 6 (six) hours as needed for moderate pain (pain score 4-6).   HYDROmorphone  (DILAUDID ) 2 MG  tablet Take 2 mg by mouth as needed for severe pain.   hydrOXYzine  (ATARAX ) 25 MG tablet TAKE 1 TABLET BY MOUTH THREE TIMES DAILY AS NEEDED FOR  ITCHING   insulin  aspart (NOVOLOG  FLEXPEN) 100 UNIT/ML FlexPen Inject 12 Units into the skin 3 (three) times daily with meals.   insulin  glargine (LANTUS  SOLOSTAR) 100 UNIT/ML Solostar Pen Inject 36 Units into the skin daily.   Insulin  Pen Needle (PEN NEEDLES) 32G X 4 MM MISC Use four times a day with insulin .   Lancet Devices (EASY TOUCH LANCING DEVICE) MISC Use to check diabetes 4 times daily  E 11.65   latanoprost  (XALATAN ) 0.005 % ophthalmic solution Place 1 drop into both eyes at bedtime.   losartan  (COZAAR ) 25 MG tablet Take 1 tablet (25 mg total) by mouth every evening.   metFORMIN  (GLUCOPHAGE -XR) 500 MG 24 hr tablet Take 2 tablets (1,000 mg total) by mouth daily with supper.   methylPREDNISolone  (MEDROL  DOSEPAK) 4 MG TBPK tablet Use po daily as directed   metoCLOPramide  (REGLAN ) 10 MG tablet Take 1 tablet (10 mg total) by mouth every 8 (eight) hours as needed for nausea.   metoprolol  succinate (TOPROL -XL) 50 MG 24 hr tablet TAKE 1 TABLET BY MOUTH ONCE DAILY. TAKE  WITH  OR  IMMEDIATELY  FOLLOWING  A  MEAL   Na Sulfate-K Sulfate-Mg Sulf 17.5-3.13-1.6 GM/177ML  SOLN Take by mouth.   naloxone  (NARCAN ) nasal spray 4 mg/0.1 mL Place 1 spray into the nose as needed (or).   naproxen  (NAPROSYN ) 500 MG tablet Take 1 tablet (500 mg total) by mouth 2 (two) times daily with a meal.   ondansetron  (ZOFRAN -ODT) 8 MG disintegrating tablet Take 1 tablet (8 mg total) by mouth every 8 (eight) hours as needed for nausea or vomiting.   pantoprazole  (PROTONIX ) 40 MG tablet Take 1 tablet by mouth twice daily   tiZANidine  (ZANAFLEX ) 4 MG tablet TAKE 1 TABLET BY MOUTH EVERY 6 HOURS AS NEEDED FOR MUSCLE SPASM   triamcinolone  cream (KENALOG ) 0.1 % Apply 1 Application topically daily.   No facility-administered encounter medications on file as of 07/17/2024.    Past  Medical History:  Diagnosis Date   Arthritis    Campylobacter enteritis 03/2011   [facility] admission   Carpal tunnel syndrome, bilateral    Diabetes mellitus    Gastritis with bleeding due to alcohol 2010   [facility] admission   GERD (gastroesophageal reflux disease)    HIDRADENITIS SUPPURATIVA 04/11/2007   Qualifier: Diagnosis of   By: Celinda MD, Erle Clunes        Hyperlipidemia    Hypertension    no meds   Menopause    Myocardial infarction [facility])    Neck pain    Wears glasses     Past Surgical History:  Procedure Laterality Date   CARPAL TUNNEL RELEASE Left 05/14/2014   Procedure: LEFT ULNAR NEUROPLASTY AT ELBOW AND ENDOSCOPIC CARPAL TUNNEL RELEASE;  Surgeon: Alm DELENA Hummer, MD;  Location: [facility];  Service: Orthopedics;  Laterality: Left;   CORONARY STENT INTERVENTION N/A 10/09/2019   Procedure: CORONARY STENT INTERVENTION;  Surgeon: Ladona Heinz, MD;  [facility];  Service: Cardiovascular;  Laterality: N/A;   CYST EXCISION  1995   under arms   endometrial biopsy  2004   benign   FOOT OSTEOTOMY  2010   right-spurs   LEFT HEART CATH AND CORONARY ANGIOGRAPHY N/A 10/09/2019   Procedure: LEFT HEART CATH AND CORONARY ANGIOGRAPHY;  Surgeon: Ladona Heinz, MD;  [facility];  Service: Cardiovascular;  Laterality: N/A;   TONSILLECTOMY     TOTAL KNEE ARTHROPLASTY Left 02/26/2023   Procedure: LEFT TOTAL KNEE ARTHROPLASTY;  Surgeon: Vernetta Lonni GRADE, MD;  Location: [facility];  Service: Orthopedics;  Laterality: Left;  Needs RNFA   ULNAR NERVE TRANSPOSITION Left 05/14/2014   Procedure: ULNAR NERVE DECOMPRESSION/TRANSPOSITION;  Surgeon: Alm DELENA Hummer, MD;  Location: [facility];  Service: Orthopedics;  Laterality: Left;    Family History  Problem Relation Age of Onset   Hypertension Mother    Diabetes Mother    Heart attack Mother    Stroke Father    Diabetes Father    Heart attack Sister    Diabetes Maternal Grandmother     Hypertension Maternal Grandmother    Colon cancer Neg Hx    Esophageal cancer Neg Hx    Rectal cancer Neg Hx    Stomach cancer Neg Hx     Social History   Socioeconomic History   Marital status: Single    Spouse name: Not on file   Number of children: 2   Years of education: 12   Highest education level: 11th grade  Occupational History   Not on file  Tobacco Use   Smoking status: Never    Passive exposure: Past   Smokeless tobacco: Never  Vaping Use   Vaping status: Never Used  Substance and Sexual Activity   Alcohol use: Not Currently   Drug use: Yes    Types: Marijuana    Comment: sometimes; last use on Tuesday, 07/20/23   Sexual activity: Yes    Birth control/protection: None    Comment: same partner x 10 years  Other Topics Concern   Not on file  Social History Narrative   She works as a Arboriculturist.   Right-handed.   One cup caffeine  per day.   She lives at home with daughter.   Social Drivers of Health   Financial Resource Strain: Medium Risk (03/22/2024)   Overall Financial Resource Strain (CARDIA)    Difficulty of Paying Living Expenses: Somewhat hard  Food Insecurity: Food Insecurity Present (03/22/2024)   Hunger Vital Sign    Worried About Running Out of Food in the Last Year: Sometimes true    Ran Out of Food in the Last Year: Sometimes true  Transportation Needs: No Transportation Needs (03/22/2024)   PRAPARE - Administrator, Civil Service (Medical): No    Lack of Transportation (Non-Medical): No  Physical Activity: Inactive (03/22/2024)   Exercise Vital Sign    Days of Exercise per Week: 0 days    Minutes of Exercise per Session: 0 min  Stress: Stress Concern Present (03/22/2024)   [employer] of Occupational Health - Occupational Stress Questionnaire    Feeling of Stress : To some extent  Social Connections: Moderately Integrated (03/22/2024)   Social Connection and Isolation Panel    Frequency of Communication with Friends and  Family: More than three times a week    Frequency of Social Gatherings with Friends and Family: Twice a week    Attends Religious Services: 1 to 4 times per year    Active Member of [employer] or Organizations: Yes    Attends Banker Meetings: 1 to 4 times per year    Marital Status: Never married  Intimate Partner Violence: Not At Risk (05/31/2023)   Humiliation, Afraid, Rape, and Kick questionnaire    Fear of Current or Ex-Partner: No    Emotionally Abused: No    Physically Abused: No    Sexually Abused: No    Review of Systems  All other systems reviewed and are negative.       Objective    BP (!) 156/78   Pulse 72   Ht 5' 4 (1.626 m)   Wt 186 lb 6.4 oz (84.6 kg)   SpO2 96%   BMI 32.00 kg/m   Physical Exam Vitals and nursing note reviewed.  Constitutional:      General: She is not in acute distress. Cardiovascular:     Rate and Rhythm: Normal rate and regular rhythm.  Pulmonary:     Effort: Pulmonary effort is normal.     Breath sounds: Normal breath sounds.  Abdominal:     Palpations: Abdomen is soft.     Tenderness: There is no abdominal tenderness.  Musculoskeletal:     Comments: Left foot in splint  Neurological:     General: No focal deficit present.     Mental Status: She is alert and oriented to person, place, and time.         Assessment & Plan:   Bilateral foot pain -     Ambulatory referral to Orthopedic Surgery  Calcaneal spur, unspecified laterality -     Ambulatory referral to Orthopedic Surgery     No follow-ups  on file.   Tanda Raguel SQUIBB, MD

## 2024-07-17 NOTE — Telephone Encounter (Signed)
 FYI Only or Action Required?: FYI only for provider.  Patient was last seen in primary care on 07/04/2024 by Tanda Bleacher, MD.  Called Nurse Triage reporting Foot Injury.  Symptoms began several days ago.  Interventions attempted: Ice/heat application.  Symptoms are: unchanged.  Triage Disposition: See HCP Within 4 Hours (Or PCP Triage)  Patient/caregiver understands and will follow disposition?: Yes. Reason for Disposition  [1] SEVERE pain (e.g., excruciating) AND [2] not improved 2 hours after pain medicine/ice packs  Answer Assessment - Initial Assessment Questions 1. MECHANISM: How did the injury happen? (e.g., twisting injury, direct blow)      Patient stepped on ladder- step borke 2. ONSET: When did the injury happen? (e.g., minutes or hours ago)      Friday 3. LOCATION: Where is the injury located?      Foot injury- left 4. APPEARANCE of INJURY: What does the injury look like?      No swelling 5. WEIGHT-BEARING: Can you put weight on that foot? Can you walk (four steps or more)?       Previous injury to the leg- no 6. SIZE: For cuts, bruises, or swelling, ask: How large is it? (e.g., inches or centimeters;  entire joint)      none 7. PAIN: Is there pain? If Yes, ask: How bad is the pain? What does it keep you from doing? (Scale 0-10; or none, mild, moderate, severe)     Yes- 10/10  9. OTHER SYMPTOMS: Do you have any other symptoms?      Body pain  Protocols used: Foot Injury-A-AH   Copied from CRM [id number]. Topic: Clinical - Red Word Triage >> Jul 17, 2024  8:04 AM Marissa P wrote: Red Word that prompted transfer to Nurse Triage: Patient calling in already went to er  but still in pain needs to follow up with pcp because she stepped on a wooden ladder and went right thru it injured the foot that she had the knee surgery with. Would like her to check her whole body because from going down her body still hurts

## 2024-07-18 NOTE — Progress Notes (Unsigned)
 Wendy Console, PA-C [address] [place], [place]  [address] Phone: [phone]   Gastroenterology Consultation  Referring Provider:     Tanda Bleacher, MD Primary Care Physician:  Tanda Bleacher, MD Primary Gastroenterologist:  Wendy Console, PA-C / Elspeth Naval, MD  Reason for Consultation:     Follow-up nausea and vomiting        HPI:   [name] is a 63 y.o. y/o female referred for consultation & management  by Tanda Bleacher, MD.  Here for follow-up of nausea and Vomiting.  She last saw Greig Corti, PA-C in our office 05/2023 for nausea and vomiting.  Prior hospitalization 05/2023 for nausea and vomiting.  Abdominal pelvic CT unrevealing.  Was thought due to cannabis use and adverse side effect of Ozempic .  Treated with Reglan .  Ozempic  was stopped.  Symptoms improved.  Takes MiraLAX  for constipation.  Has chronic opioid use for chronic pain.  Currently takes pantoprazole  40 Mg twice daily for GERD.  Current symptoms: Patient continues to have chronic nausea.  No recent vomiting episodes.  She reports having abdominal pain all over her entire abdomen and all over her entire body.  She states her whole body hurts.  She reports episodes of diarrhea alternating with constipation.  She had episode of loose stools which started 7 or 8 months ago.  Loose stools lasted 3 or 4 months.  She had to wear depends due to incontinence.  After that she developed small hard stools.  Currently having 2 bowel movements per day which are formed.  She denies melena, hematochezia, or weight loss.  Currently on pantoprazole  40 Mg daily for GERD.  Denies breakthrough heartburn.  07/2023 colonoscopy: 2 small (2 mm to 3 mm) polyps removed.  Pathology showed 1 tubular adenoma and 1 colon mucosa/hyperplastic polyp removed.  Good prep.  Diverticulosis.  7-year repeat (due 06/2030).  No previous EGD, HIDA scan, or gallbladder ultrasound.  PMH: CAD, NSTEMI, cardiac stent x 2 in 2020,, type 2  diabetes, peripheral neuropathy, osteoarthritis.  Chronic pain and chronic opioid use.  Echo in 2020 LVEF 50 to 55%.  Past Medical History:  Diagnosis Date   Arthritis    Campylobacter enteritis 03/2011   [facility] admission   Carpal tunnel syndrome, bilateral    Diabetes mellitus    Gastritis with bleeding due to alcohol 2010   [facility] admission   GERD (gastroesophageal reflux disease)    HIDRADENITIS SUPPURATIVA 04/11/2007   Qualifier: Diagnosis of   By: Celinda MD, Erle Clunes        Hyperlipidemia    Hypertension    no meds   Menopause    Myocardial infarction [place])    Neck pain    Wears glasses     Past Surgical History:  Procedure Laterality Date   CARPAL TUNNEL RELEASE Left 05/14/2014   Procedure: LEFT ULNAR NEUROPLASTY AT ELBOW AND ENDOSCOPIC CARPAL TUNNEL RELEASE;  Surgeon: Alm DELENA Hummer, MD;  Location: [facility];  Service: Orthopedics;  Laterality: Left;   CORONARY STENT INTERVENTION N/A 10/09/2019   Procedure: CORONARY STENT INTERVENTION;  Surgeon: Ladona Heinz, MD;  Location: [facility];  Service: Cardiovascular;  Laterality: N/A;   CYST EXCISION  1995   under arms   endometrial biopsy  2004   benign   FOOT OSTEOTOMY  2010   right-spurs   LEFT HEART CATH AND CORONARY ANGIOGRAPHY N/A 10/09/2019   Procedure: LEFT HEART CATH AND CORONARY ANGIOGRAPHY;  Surgeon: Ladona,  Gordy, MD;  Location: [facility];  Service: Cardiovascular;  Laterality: N/A;   TONSILLECTOMY     TOTAL KNEE ARTHROPLASTY Left 02/26/2023   Procedure: LEFT TOTAL KNEE ARTHROPLASTY;  Surgeon: Vernetta Lonni GRADE, MD;  Location: [facility];  Service: Orthopedics;  Laterality: Left;  Needs RNFA   ULNAR NERVE TRANSPOSITION Left 05/14/2014   Procedure: ULNAR NERVE DECOMPRESSION/TRANSPOSITION;  Surgeon: Alm DELENA Hummer, MD;  Location: [facility];  Service: Orthopedics;  Laterality: Left;    Prior to Admission medications   Medication Sig Start Date End Date Taking?  Authorizing Provider  Ascorbic Acid (VITAMIN C PO) Take 1 tablet by mouth daily.    [provider]  aspirin  81 MG chewable tablet Chew 1 tablet (81 mg total) by mouth 2 (two) times daily. 02/27/23   Vernetta Lonni GRADE, MD  atorvastatin  (LIPITOR) 40 MG tablet Take 1 tablet (40 mg total) by mouth daily. 05/01/24   Newlin, Enobong, MD  Blood Glucose Monitoring Suppl (ACCU-CHEK AVIVA PLUS) w/Device KIT Use to check blood sugar 2 times a day 06/04/23   Tanda Bleacher, MD  Continuous Glucose Receiver (FREESTYLE LIBRE 3 READER) DEVI Use to check blood sugar continuously throughout. Change sensor every 15 days. 06/26/24   Newlin, Enobong, MD  Continuous Glucose Sensor (FREESTYLE LIBRE 3 PLUS SENSOR) MISC Use to check blood sugar continuously throughout. Change sensor every 15 days. 06/26/24   Newlin, Enobong, MD  cyanocobalamin  (VITAMIN B12) 1000 MCG tablet Take 1 tablet (1,000 mcg total) by mouth daily. 05/25/23   Tanda Bleacher, MD  diclofenac  Sodium (VOLTAREN ) 1 % GEL Apply 4 g topically 4 (four) times daily. 07/04/24   Tanda Bleacher, MD  ezetimibe  (ZETIA ) 10 MG tablet Take 1 tablet (10 mg total) by mouth daily. 05/01/24   Newlin, Enobong, MD  FARXIGA  10 MG TABS tablet Take 1 tablet (10 mg total) by mouth daily. 05/01/24   Newlin, Enobong, MD  gabapentin  (NEURONTIN ) 600 MG tablet Take 1 tablet (600 mg total) by mouth 3 (three) times daily. Patient taking differently: Take 600 mg by mouth daily. 05/25/23   Tanda Bleacher, MD  glucose blood (BAYER CONTOUR TEST) test strip TEST BLOOD SUGARS 3 TIMES DAILY 05/25/23   Tanda Bleacher, MD  glucose blood test strip Use to check blood sugar 2 times a day 06/04/23   Tanda Bleacher, MD  HYDROcodone -acetaminophen  (NORCO/VICODIN) 5-325 MG tablet Take 1 tablet by mouth every 6 (six) hours as needed for moderate pain (pain score 4-6). 09/22/23   Gretta Bertrum ORN, PA-C  HYDROmorphone  (DILAUDID ) 2 MG tablet Take 2 mg by mouth as needed for severe pain.    [provider]  hydrOXYzine  (ATARAX ) 25 MG tablet TAKE 1 TABLET BY MOUTH THREE TIMES DAILY AS NEEDED FOR  ITCHING 03/24/24   Tanda Bleacher, MD  insulin  aspart (NOVOLOG  FLEXPEN) 100 UNIT/ML FlexPen Inject 12 Units into the skin 3 (three) times daily with meals. 05/23/24   Trixie File, MD  insulin  glargine (LANTUS  SOLOSTAR) 100 UNIT/ML Solostar Pen Inject 36 Units into the skin daily. 05/23/24   Trixie File, MD  Insulin  Pen Needle (PEN NEEDLES) 32G X 4 MM MISC Use four times a day with insulin . 05/01/24   Delbert Clam, MD  Lancet Devices (EASY TOUCH LANCING DEVICE) MISC Use to check diabetes 4 times daily  E 11.65 05/25/23   Tanda Bleacher, MD  latanoprost  (XALATAN ) 0.005 % ophthalmic solution Place 1 drop into both eyes at bedtime. 12/02/18   [provider]  losartan  (COZAAR ) 25 MG tablet Take 1 tablet (25 mg total) by mouth every evening. 05/01/24   Newlin, Enobong, MD  metFORMIN  (GLUCOPHAGE -XR) 500 MG 24 hr tablet Take 2 tablets (1,000 mg total) by mouth daily with supper. 05/01/24   Newlin, Enobong, MD  methylPREDNISolone  (MEDROL  DOSEPAK) 4 MG TBPK tablet Use po daily as directed 11/22/23   Tanda Bleacher, MD  metoCLOPramide  (REGLAN ) 10 MG tablet Take 1 tablet (10 mg total) by mouth every 8 (eight) hours as needed for nausea. 06/04/23   Rizwan, Saima, MD  metoprolol  succinate (TOPROL -XL) 50 MG 24 hr tablet TAKE 1 TABLET BY MOUTH ONCE DAILY. TAKE  WITH  OR  IMMEDIATELY  FOLLOWING  A  MEAL 12/31/23   Lorren Greig PARAS, NP  Na Sulfate-K Sulfate-Mg Sulf 17.5-3.13-1.6 GM/177ML SOLN Take by mouth. 06/22/23   [provider]  naloxone  (NARCAN ) nasal spray 4 mg/0.1 mL Place 1 spray into the nose as needed (or). 12/09/20   [provider]  naproxen  (NAPROSYN ) 500 MG tablet Take 1 tablet (500 mg total) by mouth 2 (two) times daily with a meal. 04/11/24   Tanda Bleacher, MD  ondansetron  (ZOFRAN -ODT) 8 MG disintegrating tablet Take 1 tablet (8 mg total) by mouth every 8 (eight) hours as  needed for nausea or vomiting. 05/31/23   Steinl, Kevin, MD  pantoprazole  (PROTONIX ) 40 MG tablet Take 1 tablet by mouth twice daily 03/24/24   Tanda Bleacher, MD  tiZANidine  (ZANAFLEX ) 4 MG tablet TAKE 1 TABLET BY MOUTH EVERY 6 HOURS AS NEEDED FOR MUSCLE SPASM 03/24/24   Tanda Bleacher, MD  triamcinolone  cream (KENALOG ) 0.1 % Apply 1 Application topically daily.    [provider]    Family History  Problem Relation Age of Onset   Hypertension Mother    Diabetes Mother    Heart attack Mother    Stroke Father    Diabetes Father    Heart attack Sister    Diabetes Maternal Grandmother    Hypertension Maternal Grandmother    Colon cancer Neg Hx    Esophageal cancer Neg Hx    Rectal cancer Neg Hx    Stomach cancer Neg Hx      Social History   Tobacco Use   Smoking status: Never    Passive exposure: Past   Smokeless tobacco: Never  Vaping Use   Vaping status: Never Used  Substance Use Topics   Alcohol use: Not Currently   Drug use: Yes    Types: Marijuana    Comment: sometimes; last use on Tuesday, 07/20/23    Allergies as of 07/19/2024 - Review Complete 07/19/2024  Allergen Reaction Noted   Penicillins Rash 01/31/2013    Review of Systems:    All systems reviewed and negative except where noted in HPI.   Physical Exam:  BP (!) 140/60 (Cuff Size: Normal)   Pulse 81   Ht 5' 3 (1.6 m)   Wt 188 lb 4 oz (85.4 kg)   BMI 33.35 kg/m  No LMP recorded. Patient is postmenopausal.  General:   Alert,  Well-developed, well-nourished, pleasant and cooperative in NAD Lungs:  Respirations even and unlabored.  Clear throughout to auscultation.   No wheezes, crackles, or rhonchi. No acute distress. Heart:  Regular rate and rhythm; no murmurs, clicks, rubs, or gallops. Abdomen:  Normal bowel sounds.  No bruits.  Soft, and distended and obese without masses, hepatosplenomegaly or hernias noted.  There is mild generalized tenderness throughout entire abdomen.  There is pressure  with  palpation, but no significant abdominal pain.  No guarding or rebound tenderness.    Neurologic:  Alert and oriented x3;  grossly normal neurologically. Psych:  Alert and cooperative. Normal mood and affect.  Imaging Studies: No results found.  Labs: CBC    Component Value Date/Time   WBC 7.5 06/02/2023 0627   RBC 4.63 06/02/2023 0627   HGB 12.8 06/02/2023 0627   HGB 11.5 04/20/2023 1458   HCT 41.5 06/02/2023 0627   HCT 36.1 04/20/2023 1458   PLT 321 06/02/2023 0627   PLT 471 (H) 04/20/2023 1458   MCV 89.6 06/02/2023 0627   MCV 88 04/20/2023 1458    CMP     Component Value Date/Time   NA 140 06/26/2024 1359   K 3.9 06/26/2024 1359   CL 103 06/26/2024 1359   CO2 23 06/26/2024 1359   GLUCOSE 252 (H) 06/26/2024 1359   GLUCOSE 140 (H) 06/02/2023 0627   BUN 12 06/26/2024 1359   CREATININE 0.68 06/26/2024 1359   CREATININE 0.65 01/24/2021 1606   CALCIUM  9.0 06/26/2024 1359   PROT 6.6 06/26/2024 1359   ALBUMIN 4.2 06/26/2024 1359   AST 22 06/26/2024 1359   ALT 20 06/26/2024 1359   ALKPHOS 107 06/26/2024 1359   BILITOT 0.3 06/26/2024 1359   GFRNONAA >60 06/02/2023 0627   GFRNONAA 96 01/24/2021 1606   GFRAA 111 01/24/2021 1606    Assessment and Plan:   [name] is a 63 y.o. y/o female has been referred for follow-up of chronic nausea.  Differential includes hyperemesis cannabis syndrome, adverse side effect of medications (opioids, Farxiga ), diabetic gastroparesis, peptic ulcer disease, gastritis, cholelithiasis, biliary dyskinesia.  History and physical exam is not consistent with cholelithiasis.  She has never had an EGD.  I am ordering an EGD to begin her workup.  She had negative abdominal pelvic CT 05/2023.  1.  Chronic nausea (No current Vomiting).   - EGD: Evaluate for peptic ulcer, gastritis, H. Pylori I discussed risks of EGD with patient to include risk of bleeding, perforation, and risk of sedation.  Patient expressed understanding and agrees to  proceed with EGD.  - Continue Reglan  prn - Avoid all marijuana and cannabis - Limit opioid use - If EGD is unrevealing and symptoms persist, then schedule RUQ ultrasound and possible HIDA scan. - Also consider gastric emptying test if she has recurrent vomiting.  2.  GERD - Continue pantoprazole  40 Mg daily - GERD diet  3.  History of adenomatous colon polyp - 7-year repeat colonoscopy  Follow up 3 months with TG.  Wendy Console, PA-C

## 2024-07-19 ENCOUNTER — Ambulatory Visit (INDEPENDENT_AMBULATORY_CARE_PROVIDER_SITE_OTHER): Admitting: Physician Assistant

## 2024-07-19 ENCOUNTER — Encounter: Payer: Self-pay | Admitting: Physician Assistant

## 2024-07-19 VITALS — BP 140/60 | HR 81 | Ht 63.0 in | Wt 188.2 lb

## 2024-07-19 DIAGNOSIS — K219 Gastro-esophageal reflux disease without esophagitis: Secondary | ICD-10-CM | POA: Diagnosis not present

## 2024-07-19 DIAGNOSIS — Z860101 Personal history of adenomatous and serrated colon polyps: Secondary | ICD-10-CM

## 2024-07-19 DIAGNOSIS — Z860100 Personal history of colon polyps, unspecified: Secondary | ICD-10-CM

## 2024-07-19 DIAGNOSIS — R1084 Generalized abdominal pain: Secondary | ICD-10-CM

## 2024-07-19 DIAGNOSIS — R112 Nausea with vomiting, unspecified: Secondary | ICD-10-CM

## 2024-07-19 DIAGNOSIS — R110 Nausea: Secondary | ICD-10-CM | POA: Diagnosis not present

## 2024-07-19 NOTE — Patient Instructions (Signed)
 You have been scheduled for an Endoscopy. Please follow written instructions given to you at your visit today.  If you use inhalers (even only as needed), please bring them with you on the day of your procedure.  If you take any of the following medications, they will need to be adjusted prior to your procedure:   DO NOT TAKE 7 DAYS PRIOR TO TEST- Trulicity (dulaglutide) Ozempic , Wegovy  (semaglutide ) Mounjaro (tirzepatide) Bydureon Bcise (exanatide extended release)  DO NOT TAKE 1 DAY PRIOR TO YOUR TEST Rybelsus  (semaglutide ) Adlyxin (lixisenatide) Victoza (liraglutide) Byetta (exanatide) ___________________________________________________________________________  Please follow up sooner if symptoms increase or worsen  Due to recent changes in healthcare laws, you may see the results of your imaging and laboratory studies on MyChart before your provider has had a chance to review them.  We understand that in some cases there may be results that are confusing or concerning to you. Not all laboratory results come back in the same time frame and the provider may be waiting for multiple results in order to interpret others.  Please give us  48 hours in order for your provider to thoroughly review all the results before contacting the office for clarification of your results.   Thank you for trusting me with your gastrointestinal care!   Ellouise Console, PA-C _______________________________________________________  If your blood pressure at your visit was 140/90 or greater, please contact your primary care physician to follow up on this.  _______________________________________________________  If you are age 64 or older, your body mass index should be between 23-30. Your Body mass index is 33.35 kg/m. If this is out of the aforementioned range listed, please consider follow up with your Primary Care Provider.  If you are age 65 or younger, your body mass index should be between 19-25. Your  Body mass index is 33.35 kg/m. If this is out of the aformentioned range listed, please consider follow up with your Primary Care Provider.   ________________________________________________________  The  GI providers would like to encourage you to use MYCHART to communicate with providers for non-urgent requests or questions.  Due to long hold times on the telephone, sending your provider a message by [facility] may be a faster and more efficient way to get a response.  Please allow 48 business hours for a response.  Please remember that this is for non-urgent requests.  _______________________________________________________

## 2024-07-19 NOTE — Progress Notes (Signed)
 Agree with assessment and plan as outlined.  We can await EGD.  Otherwise, very possible opioids could be causing her nausea, certainly confounding the picture

## 2024-07-21 ENCOUNTER — Encounter: Payer: Self-pay | Admitting: Cardiology

## 2024-07-21 ENCOUNTER — Ambulatory Visit: Attending: Cardiology | Admitting: Cardiology

## 2024-07-21 ENCOUNTER — Other Ambulatory Visit (HOSPITAL_COMMUNITY): Payer: Self-pay

## 2024-07-21 VITALS — BP 128/76 | HR 69 | Resp 16 | Ht 63.0 in | Wt 187.0 lb

## 2024-07-21 DIAGNOSIS — I739 Peripheral vascular disease, unspecified: Secondary | ICD-10-CM

## 2024-07-21 DIAGNOSIS — E1165 Type 2 diabetes mellitus with hyperglycemia: Secondary | ICD-10-CM | POA: Diagnosis not present

## 2024-07-21 DIAGNOSIS — I2510 Atherosclerotic heart disease of native coronary artery without angina pectoris: Secondary | ICD-10-CM

## 2024-07-21 DIAGNOSIS — I10 Essential (primary) hypertension: Secondary | ICD-10-CM

## 2024-07-21 DIAGNOSIS — Z794 Long term (current) use of insulin: Secondary | ICD-10-CM

## 2024-07-21 MED ORDER — RYBELSUS 3 MG PO TABS
1.0000 | ORAL_TABLET | Freq: Every day | ORAL | 1 refills | Status: DC
  Filled 2024-07-21 (×2): qty 30, 30d supply, fill #0

## 2024-07-21 NOTE — Patient Instructions (Addendum)
 Medication Instructions:  Your physician has recommended you make the following change in your medication:  Start rybelsus  3 mg by mouth daily *If you need a refill on your cardiac medications before your next appointment, please call your pharmacy*  Lab Work: none If you have labs (blood work) drawn today and your tests are completely normal, you will receive your results only by: MyChart Message (if you have MyChart) OR A paper copy in the mail If you have any lab test that is abnormal or we need to change your treatment, we will call you to review the results.  Testing/Procedures: none  Follow-Up: At [facility], you and your health needs are our priority.  As part of our continuing mission to provide you with exceptional heart care, our providers are all part of one team.  This team includes your primary Cardiologist (physician) and Advanced Practice Providers or APPs (Physician Assistants and Nurse Practitioners) who all work together to provide you with the care you need, when you need it.  Your next appointment:   12 month(s)  Provider:   Gordy Bergamo, MD    We recommend signing up for the patient portal called MyChart.  Sign up information is provided on this After Visit Summary.  MyChart is used to connect with patients for Virtual Visits (Telemedicine).  Patients are able to view lab/test results, encounter notes, upcoming appointments, etc.  Non-urgent messages can be sent to your provider as well.   To learn more about what you can do with MyChart, go to [email or web address].   Other Instructions  You have been referred to see the pharmacist in our office.  Appointment is September 25 at 1:15

## 2024-07-21 NOTE — Progress Notes (Signed)
 Cardiology Office Note:  .   Date:  07/22/2024  ID:  [name], DOB Feb 11, 1961, MRN [id number] PCP: Tanda Bleacher, MD  [facility] Providers Cardiologist:  Gordy Bergamo, MD   History of Present Illness: .   [name] is a 63 y.o. AAF  with diabetes mellitus, hyperlipidemia, prior cigarette smoking quit in 2013, smokes marijuana occasionally, CAD  Presenting with NSTEMI, S/P stenting to mid right and also to the distal LAD by implantation of DES on 10/09/2019.  Due to symptoms suggestive of claudication, underwent lower extremity arterial duplex on 04/20/2024 revealing mild diffuse disease bilateral SFA and focal peroneal high-grade disease on the left and right, and normal bilateral ABI however with monophasic waveforms at the level of the ankle on the left and biphasic waveform on the right suggesting diffuse small vessel disease.  She presents for follow-up, has had a fall and now has left foot splint.  No other specific complaints.  Cardiac Studies relevent.    Lower Extremity Arterial Duplex 04/20/2024:  Right: 30-49% stenosis noted in the peroneal artery. Mild to moderate  atherosclerosis noted throughout extremity.  Left: 30-49% stenosis noted in the superficial femoral artery. 30-49%  stenosis noted in the peroneal artery. Mild to moderate atherosclerosis  noted throughout extremity.   ABI Right: Resting right ankle-brachial index is within normal range at 1.23. The  right toe-brachial index is normal.  Left: The left toe-brachial index is abnormal. Although ankle brachial indices are within normal limits at 1.22 , arterial Doppler waveforms at the ankle suggest some component of arterial occlusive disease.    Left heart catheterization 10/09/2019:   RCA 2.5 x 38 and a 2.5 x 8 mm resolute Onyx DES  Mid LAD:  2.25 x 15 mm resolute Onyx DES     Echocardiogram 10/09/2019: Normal LVEF at 50 to 55% with basal to mid inferolateral hypokinesis.     Discussed the  use of AI scribe software for clinical note transcription with the patient, who gave verbal consent to proceed.  History of Present Illness [name] is a 63 year old female with diabetes and coronary artery disease who presents with leg discomfort and weight gain.  She experiences a sensation of heaviness in her legs and has gained approximately five pounds, feeling 'puffed up' and 'blowing up'. She is actively trying to lose this weight. She has discomfort, weakness, thinning, and numbness in her legs. She has been informed of mild plaque buildup in her legs.  She has diabetes and previously used Ozempic  for management but discontinued it due to severe nausea and vomiting and dehydration on 07/04/2022, felt to be due to multifactorial etiology including daily marijuana use and dehydration however Ozempic  was also felt to be a potential cause. Her current medications include Zetia  for cholesterol and insulin  for diabetes, and she is compliant with her regimen.  Labs   Lab Results  Component Value Date   CHOL 115 06/26/2024   HDL 43 06/26/2024   LDLCALC 47 06/26/2024   LDLDIRECT 94.0 09/25/2015   TRIG 145 06/26/2024   CHOLHDL 2.7 06/26/2024   Lipoprotein (a)  Date/Time Value Ref Range Status  12/08/2019 08:26 AM 127.7 (H) <75.0 nmol/L Final    Comment:    Note:  Values greater than or equal to 75.0 nmol/L may        indicate an independent risk factor for CHD,        but must be evaluated with caution when applied  to non-Caucasian populations due to the        influence of genetic factors on Lp(a) across        ethnicities.     Recent Labs    06/26/24 1359  NA 140  K 3.9  CL 103  CO2 23  GLUCOSE 252*  BUN 12  CREATININE 0.68  CALCIUM  9.0    Lab Results  Component Value Date   ALT 20 06/26/2024   AST 22 06/26/2024   ALKPHOS 107 06/26/2024   BILITOT 0.3 06/26/2024      Latest Ref Rng & Units 06/02/2023    6:27 AM 06/01/2023    6:09 AM 05/31/2023   11:40 AM   CBC  WBC 4.0 - 10.5 K/uL 7.5  11.7  15.0   Hemoglobin 12.0 - 15.0 g/dL 87.1  87.0  84.1   Hematocrit 36.0 - 46.0 % 41.5  42.3  50.7   Platelets 150 - 400 K/uL 321  369  418    Lab Results  Component Value Date   HGBA1C 8.5 (A) 06/26/2024    Lab Results  Component Value Date   TSH 0.903 06/01/2023     ROS  Review of Systems  Cardiovascular:  Negative for chest pain, dyspnea on exertion and leg swelling.   Physical Exam:   VS:  BP 128/76 (BP Location: Left Arm, Patient Position: Sitting, Cuff Size: Large)   Pulse 69   Resp 16   Ht 5' 3 (1.6 m)   Wt 187 lb (84.8 kg) Comment: Patient is wearing a boot on foot.  SpO2 100%   BMI 33.13 kg/m    Wt Readings from Last 3 Encounters:  07/21/24 187 lb (84.8 kg)  07/19/24 188 lb 4 oz (85.4 kg)  07/17/24 186 lb 6.4 oz (84.6 kg)    BP Readings from Last 3 Encounters:  07/21/24 128/76  07/19/24 (!) 140/60  07/17/24 (!) 156/78   Physical Exam Neck:     Vascular: No carotid bruit or JVD.  Cardiovascular:     Rate and Rhythm: Normal rate and regular rhythm.     Pulses:          Dorsalis pedis pulses are 1+ on the right side and 0 on the left side.       Posterior tibial pulses are 0 on the right side and 0 on the left side.     Heart sounds: Normal heart sounds. No murmur heard.    No gallop.  Pulmonary:     Effort: Pulmonary effort is normal.     Breath sounds: Normal breath sounds.  Abdominal:     General: Bowel sounds are normal.     Palpations: Abdomen is soft.  Musculoskeletal:     Right lower leg: No edema.     Left lower leg: No edema.    EKG:         ASSESSMENT AND PLAN: .      ICD-10-CM   1. Coronary artery disease involving native coronary artery of native heart without angina pectoris  I25.10 Semaglutide  (RYBELSUS ) 3 MG TABS    2. Peripheral artery disease (HCC)  I73.9 Semaglutide  (RYBELSUS ) 3 MG TABS    AMB Referral to [facility]    3. Essential hypertension  I10     4. Type 2 diabetes  mellitus with hyperglycemia, with long-term current use of insulin  (HCC)  E11.65 Semaglutide  (RYBELSUS ) 3 MG TABS   Z79.4 AMB Referral to [facility]     Assessment &  Plan Coronary artery disease without angina Coronary artery disease is well-managed with current medications. No recent chest pain reported. Cholesterol levels are well-controlled with current regimen. - Continue Zetia  10 mg once daily. - Continue atorvastatin  40 mg once daily. - Continue aspirin  81 mg once daily.  PAD with claudication Her ABI and lower extremity arterial duplex reviewed, she has diffuse plaque and below-knee bilateral peroneal artery disease, medical therapy would be appropriate.  She is presently doing well on high intensity statin along with Zetia  with LDL being at goal and also on aspirin . - Her risk factors are well-controlled except for uncontrolled diabetes mellitus.  Hypertension Blood pressure is well-controlled with current medication regimen. - Continue metoprolol  succinate 50 mg once daily. - Continue losartan  25 mg once daily.  Type 2 diabetes mellitus with hyperglycemia and peripheral artery disease of lower extremities Diabetes contributes to discomfort, weakness, and numbness in the legs. Mild plaque buildup is present in the lower extremities without focal blockage requiring stenting. Previous use of Ozempic  was discontinued as a potential cause for dehydration however she had done well with the medication, nausea and vomiting felt to be multifactorial on 07/04/2022 including daily marijuana use.  I would like to try Rybelsus , a daily tablet form of semaglutide , was discussed as an alternative treatment to aid in weight loss and improve diabetes control. Rybelsus  is less potent than Ozempic  and requires daily administration, allowing for easier management of side effects. - Start Rybelsus  3 mg daily. - Refer to clinical pharmacist for dose escalation of Rybelsus . - Monitor for  gastrointestinal side effects and adjust treatment as needed.  Obesity Weight gain noted. Rybelsus  may aid in weight management in addition to diabetes control. - Start Rybelsus  3 mg daily as part of diabetes management, which may also aid in weight loss.  Left foot and knee pain after injury with underlying osteoarthritis and heel spur Recent injury to left foot and knee after stepping on a wooden ladder. Currently using a boot for support. Underlying osteoarthritis and heel spur present. - Continue using the boot for support until further evaluation.   Follow up: 6 months.  Signed,  Gordy Bergamo, MD, [facility] 07/22/2024, 9:18 AM [facility] [address] [place], [place] [address] Phone: [phone]. Fax:  [phone]

## 2024-07-24 ENCOUNTER — Other Ambulatory Visit: Payer: Self-pay

## 2024-08-02 ENCOUNTER — Other Ambulatory Visit: Payer: Self-pay

## 2024-08-02 ENCOUNTER — Other Ambulatory Visit: Payer: Self-pay | Admitting: Family Medicine

## 2024-08-03 ENCOUNTER — Ambulatory Visit: Admitting: Cardiology

## 2024-08-04 ENCOUNTER — Other Ambulatory Visit: Payer: Self-pay

## 2024-08-04 MED ORDER — GABAPENTIN 600 MG PO TABS
600.0000 mg | ORAL_TABLET | Freq: Three times a day (TID) | ORAL | 5 refills | Status: AC
  Filled 2024-08-04: qty 90, 30d supply, fill #0
  Filled 2024-11-27: qty 90, 30d supply, fill #1
  Filled 2024-12-27: qty 90, 30d supply, fill #2

## 2024-08-09 ENCOUNTER — Encounter: Payer: Self-pay | Admitting: Internal Medicine

## 2024-08-09 ENCOUNTER — Encounter: Payer: Self-pay | Admitting: Gastroenterology

## 2024-08-09 ENCOUNTER — Other Ambulatory Visit: Payer: Self-pay

## 2024-08-09 ENCOUNTER — Ambulatory Visit (INDEPENDENT_AMBULATORY_CARE_PROVIDER_SITE_OTHER): Admitting: Internal Medicine

## 2024-08-09 VITALS — BP 120/60 | HR 79 | Ht 63.0 in | Wt 186.2 lb

## 2024-08-09 DIAGNOSIS — E663 Overweight: Secondary | ICD-10-CM | POA: Diagnosis not present

## 2024-08-09 DIAGNOSIS — Z794 Long term (current) use of insulin: Secondary | ICD-10-CM | POA: Diagnosis not present

## 2024-08-09 DIAGNOSIS — E1169 Type 2 diabetes mellitus with other specified complication: Secondary | ICD-10-CM | POA: Diagnosis not present

## 2024-08-09 DIAGNOSIS — E785 Hyperlipidemia, unspecified: Secondary | ICD-10-CM

## 2024-08-09 DIAGNOSIS — E11319 Type 2 diabetes mellitus with unspecified diabetic retinopathy without macular edema: Secondary | ICD-10-CM | POA: Diagnosis not present

## 2024-08-09 NOTE — Patient Instructions (Addendum)
 Please continue: - Metformin  ER 1000 mg 2x a day - Farxiga  10 mg before b'fast - Lantus  32 units daily - Novolog  12 units 3x day  Take NovoLog  15 min before meals! If you need to take it after a meal, may need a lower dose.    Please return in 3 months.

## 2024-08-09 NOTE — Progress Notes (Signed)
 Patient ID: SALMA WALROND, female   DOB: 1961/08/25, 63 y.o.   MRN: [id number]  HPI: DELORICE BANNISTER is a 63 y.o.-year-old female, returning for f/u for DM2, dx ~2000, insulin -dependent since 2010, uncontrolled, with complications (PN, + DR). Last visit 3 months ago.  Interim history: No increased urination, blurry vision. She had diarrhea/stool incontinence but this improved after switching to metformin  ER. Before last visit, she relaxed her diet and restarted to drink sweet drinks.  She also did not wear the sensor consistently as it was coming off.  She restarted the sensor since last visit and stopped sweet drinks. She has upper resp. congestion, and some cough, no fever.  Reviewed HbA1c levels: Lab Results  Component Value Date   HGBA1C 8.5 (A) 06/26/2024   HGBA1C 12.1 (A) 03/29/2024   HGBA1C 7.7 (A) 11/22/2023   HGBA1C 14.4 (A) 08/23/2023   HGBA1C 8.6 (H) 06/01/2023   HGBA1C 6.2 (H) 02/15/2023   HGBA1C 6.2 (A) 10/26/2022   HGBA1C 7.8 (H) 07/04/2022   HGBA1C 6.5 (A) 04/10/2022   HGBA1C 7.7 (A) 11/10/2021   HGBA1C 7.3 (A) 01/24/2021   HGBA1C 7.1 (A) 09/13/2020   HGBA1C 6.4 (A) 05/13/2020   HGBA1C 7.6 (A) 08/31/2019   HGBA1C 7.8 (A) 11/25/2018   HGBA1C 6.8 (A) 07/25/2018   HGBA1C 6.4% 03/03/2018   HGBA1C 6.5 11/01/2017   HGBA1C 6.2 07/23/2017   HGBA1C 6.8 04/09/2017   HGBA1C 6.4 01/08/2017   HGBA1C 6.4 10/08/2016   HGBA1C 10.4 07/07/2016   HGBA1C 10.4 04/07/2016   HGBA1C 9.3 01/14/2016   HGBA1C 10.9 (H) 09/25/2015   HGBA1C 11.2 09/19/2015   HGBA1C 7.7 06/21/2015   HGBA1C 7.9 (H) 03/22/2015   HGBA1C 9.4 (H) 01/04/2015   Pt was on:  Insulin  Before breakfast Before lunch Before dinner  R 10 10 (6 units if active after the meal) 20-25  NPH 15 - 20  Also: - Metformin  1000 mg twice a day  Then on: - Metformin  2000 mg with dinner >> restarted 1000 mg with dinner - Farxiga  10 mg before b'fast - >> off  - Tresiba  U200 32 units daily  - FiAsp : 8-11 units before  b'fast 8-11 units before lunch - missing 13-16 units before dinner - missing  I have recommended the following regimen: - Metformin  1000 mg with dinner >> metformin  ER 1000 mg 2x a day - Farxiga  10 mg before b'fast - Tresiba  U200 32 >> 28 units daily >> Lantus  32 >> 36 units - FiAsp  >> Novolog  12 units before each meal >>  Pt is checking her sugars >4x a day with her CGM:  Previously:  Prev.:  Lowest sugar was 29 (?) in 11/2020 >> .SABRA. 40s >> 59 >> 69 >> 50s; she has hypoglycemia awareness in the 80s. Highest sugar was 400s >> 300s >> 300s >> 399.  -No CKD, last BUN/creatinine:  Lab Results  Component Value Date   BUN 12 06/26/2024   CREATININE 0.68 06/26/2024   ACR was normal: Lab Results  Component Value Date   MICRALBCREAT NOTE 10/01/2023   MICRALBCREAT <4 11/12/2022   MICRALBCREAT 3 01/24/2021  Not on ACE inhibitor/ARB  -+ HL; last set of lipids: Lab Results  Component Value Date   CHOL 115 06/26/2024   HDL 43 06/26/2024   LDLCALC 47 06/26/2024   LDLDIRECT 94.0 09/25/2015   TRIG 145 06/26/2024   CHOLHDL 2.7 06/26/2024  On Lipitor 40, prev. Also on Zetia  10.  - last eye exam was in 11/2022 reportedly: +  DR OU, reportedly glaucoma. She sees Dr. Mavis.    - + numbness and tingling in her feet.  She is on Neurontin .  She sees podiatry.  Last foot exam: 03/27/2024 (Dr. Loreda).  She also has a history of HTN, episodic alcohol abuse, GERD, low back pain, OA.  She went to the emergency room 06/28/2021 for nausea, vomiting, and abdominal pain.  Her lipase was high.  Ozempic  was stopped. She was also in the ED post a fall down the stairs 08/2021. L forearm was in a splint >> saw ortho She had severe sciatic back pain.  She had nausea/vomiting/abdominal pain (06/2022) - was admitted.  Lipase level was normal.  It was considered that the symptoms were related to smoking marijuana and also possibly her diabetes.   She had an almost identical admission in 05/2023.  Ozempic   was stopped, however, she was again smoking marijuana at that time.  ROS: + see HPI  I reviewed pt's medications, allergies, PMH, social hx, family hx, and changes were documented in the history of present illness. Otherwise, unchanged from my initial visit note.  Past Medical History:  Diagnosis Date   Arthritis    Campylobacter enteritis 03/2011   [facility] admission   Carpal tunnel syndrome, bilateral    Diabetes mellitus    Gastritis with bleeding due to alcohol 2010   [facility] admission   GERD (gastroesophageal reflux disease)    HIDRADENITIS SUPPURATIVA 04/11/2007   Qualifier: Diagnosis of   By: Celinda MD, Erle Clunes        Hyperlipidemia    Hypertension    no meds   Menopause    Myocardial infarction [facility])    Neck pain    Wears glasses    Past Surgical History:  Procedure Laterality Date   CARPAL TUNNEL RELEASE Left 05/14/2014   Procedure: LEFT ULNAR NEUROPLASTY AT ELBOW AND ENDOSCOPIC CARPAL TUNNEL RELEASE;  Surgeon: Alm DELENA Hummer, MD;  Location: [facility];  Service: Orthopedics;  Laterality: Left;   CORONARY STENT INTERVENTION N/A 10/09/2019   Procedure: CORONARY STENT INTERVENTION;  Surgeon: Ladona Heinz, MD;  Location: [facility];  Service: Cardiovascular;  Laterality: N/A;   CYST EXCISION  1995   under arms   endometrial biopsy  2004   benign   FOOT OSTEOTOMY  2010   right-spurs   LEFT HEART CATH AND CORONARY ANGIOGRAPHY N/A 10/09/2019   Procedure: LEFT HEART CATH AND CORONARY ANGIOGRAPHY;  Surgeon: Ladona Heinz, MD;  Location: [facility];  Service: Cardiovascular;  Laterality: N/A;   TONSILLECTOMY     TOTAL KNEE ARTHROPLASTY Left 02/26/2023   Procedure: LEFT TOTAL KNEE ARTHROPLASTY;  Surgeon: Vernetta Lonni GRADE, MD;  Location: [facility];  Service: Orthopedics;  Laterality: Left;  Needs RNFA   ULNAR NERVE TRANSPOSITION Left 05/14/2014   Procedure: ULNAR NERVE DECOMPRESSION/TRANSPOSITION;  Surgeon: Alm DELENA Hummer, MD;  Location: [facility]  [place];  Service: Orthopedics;  Laterality: Left;   Social History   Socioeconomic History   Marital status: Single    Spouse name: Not on file   Number of children: 2   Years of education: 68   Highest education level: 11th grade  Occupational History   Not on file  Tobacco Use   Smoking status: Never    Passive exposure: Past   Smokeless tobacco: Never  Vaping Use   Vaping status: Never Used  Substance and Sexual Activity   Alcohol use: Not Currently   Drug use: Yes  Types: Marijuana    Comment: sometimes; last use on Tuesday, 07/20/23   Sexual activity: Yes    Birth control/protection: None    Comment: same partner x 10 years  Other Topics Concern   Not on file  Social History Narrative   She works as a Arboriculturist.   Right-handed.   One cup caffeine  per day.   She lives at home with daughter.   Social Drivers of Health   Financial Resource Strain: Medium Risk (03/22/2024)   Overall Financial Resource Strain (CARDIA)    Difficulty of Paying Living Expenses: Somewhat hard  Food Insecurity: Food Insecurity Present (03/22/2024)   Hunger Vital Sign    Worried About Running Out of Food in the Last Year: Sometimes true    Ran Out of Food in the Last Year: Sometimes true  Transportation Needs: No Transportation Needs (03/22/2024)   PRAPARE - Administrator, Civil Service (Medical): No    Lack of Transportation (Non-Medical): No  Physical Activity: Inactive (03/22/2024)   Exercise Vital Sign    Days of Exercise per Week: 0 days    Minutes of Exercise per Session: 0 min  Stress: Stress Concern Present (03/22/2024)   [employer] of Occupational Health - Occupational Stress Questionnaire    Feeling of Stress : To some extent  Social Connections: Moderately Integrated (03/22/2024)   Social Connection and Isolation Panel    Frequency of Communication with Friends and Family: More than three times a week    Frequency of Social Gatherings with  Friends and Family: Twice a week    Attends Religious Services: 1 to 4 times per year    Active Member of [employer] or Organizations: Yes    Attends Banker Meetings: 1 to 4 times per year    Marital Status: Never married  Intimate Partner Violence: Not At Risk (05/31/2023)   Humiliation, Afraid, Rape, and Kick questionnaire    Fear of Current or Ex-Partner: No    Emotionally Abused: No    Physically Abused: No    Sexually Abused: No   Current Outpatient Medications on File Prior to Visit  Medication Sig Dispense Refill   Ascorbic Acid (VITAMIN C PO) Take 1 tablet by mouth daily.     aspirin  81 MG chewable tablet Chew 1 tablet (81 mg total) by mouth 2 (two) times daily. 30 tablet 0   atorvastatin  (LIPITOR) 40 MG tablet Take 1 tablet (40 mg total) by mouth daily. 90 tablet 3   Blood Glucose Monitoring Suppl (ACCU-CHEK AVIVA PLUS) w/Device KIT Use to check blood sugar 2 times a day 1 kit 0   Continuous Glucose Receiver (FREESTYLE LIBRE 3 READER) DEVI Use to check blood sugar continuously throughout. Change sensor every 15 days. 1 each 0   Continuous Glucose Sensor (FREESTYLE LIBRE 3 PLUS SENSOR) MISC Use to check blood sugar continuously throughout. Change sensor every 15 days. 2 each 6   cyanocobalamin  (VITAMIN B12) 1000 MCG tablet Take 1 tablet (1,000 mcg total) by mouth daily. 90 tablet 0   diclofenac  Sodium (VOLTAREN ) 1 % GEL Apply 4 g topically 4 (four) times daily. 350 g 1   ezetimibe  (ZETIA ) 10 MG tablet Take 1 tablet (10 mg total) by mouth daily. 90 tablet 3   FARXIGA  10 MG TABS tablet Take 1 tablet (10 mg total) by mouth daily. 90 tablet 0   gabapentin  (NEURONTIN ) 600 MG tablet Take 1 tablet (600 mg total) by mouth 3 (three) times daily. 90 tablet 5  glucose blood (BAYER CONTOUR TEST) test strip TEST BLOOD SUGARS 3 TIMES DAILY 200 each 11   glucose blood test strip Use to check blood sugar 2 times a day 100 each 12   HYDROcodone -acetaminophen  (NORCO/VICODIN) 5-325 MG tablet  Take 1 tablet by mouth every 6 (six) hours as needed for moderate pain (pain score 4-6). 30 tablet 0   HYDROmorphone  (DILAUDID ) 2 MG tablet Take 2 mg by mouth as needed for severe pain.     hydrOXYzine  (ATARAX ) 25 MG tablet TAKE 1 TABLET BY MOUTH THREE TIMES DAILY AS NEEDED FOR  ITCHING 90 tablet 0   ibuprofen  (ADVIL ) 800 MG tablet Take 800 mg by mouth 3 (three) times daily.     insulin  aspart (NOVOLOG  FLEXPEN) 100 UNIT/ML FlexPen Inject 12 Units into the skin 3 (three) times daily with meals. 30 mL 3   insulin  glargine (LANTUS  SOLOSTAR) 100 UNIT/ML Solostar Pen Inject 36 Units into the skin daily. 30 mL 3   Insulin  Pen Needle (PEN NEEDLES) 32G X 4 MM MISC Use four times a day with insulin . 400 each 2   Lancet Devices (EASY TOUCH LANCING DEVICE) MISC Use to check diabetes 4 times daily  E 11.65 1 each 11   losartan  (COZAAR ) 25 MG tablet Take 1 tablet (25 mg total) by mouth every evening. 90 tablet 3   metFORMIN  (GLUCOPHAGE -XR) 500 MG 24 hr tablet Take 2 tablets (1,000 mg total) by mouth daily with supper. 180 tablet 3   metoCLOPramide  (REGLAN ) 10 MG tablet Take 1 tablet (10 mg total) by mouth every 8 (eight) hours as needed for nausea. 30 tablet 0   metoprolol  succinate (TOPROL -XL) 50 MG 24 hr tablet TAKE 1 TABLET BY MOUTH ONCE DAILY. TAKE  WITH  OR  IMMEDIATELY  FOLLOWING  A  MEAL 90 tablet 0   naloxone  (NARCAN ) nasal spray 4 mg/0.1 mL Place 1 spray into the nose as needed (or).     naproxen  (NAPROSYN ) 500 MG tablet Take 1 tablet (500 mg total) by mouth 2 (two) times daily with a meal. 60 tablet 2   ondansetron  (ZOFRAN -ODT) 8 MG disintegrating tablet Take 1 tablet (8 mg total) by mouth every 8 (eight) hours as needed for nausea or vomiting. 10 tablet 0   pantoprazole  (PROTONIX ) 40 MG tablet Take 1 tablet by mouth twice daily 180 tablet 0   Semaglutide  (RYBELSUS ) 3 MG TABS Take 1 tablet (3 mg total) by mouth daily. 30 tablet 1   tiZANidine  (ZANAFLEX ) 4 MG tablet TAKE 1 TABLET BY MOUTH EVERY 6 HOURS  AS NEEDED FOR MUSCLE SPASM 90 tablet 0   triamcinolone  cream (KENALOG ) 0.1 % Apply 1 Application topically daily.     No current facility-administered medications on file prior to visit.   Allergies  Allergen Reactions   Penicillins Rash   Family History  Problem Relation Age of Onset   Hypertension Mother    Diabetes Mother    Heart attack Mother    Stroke Father    Diabetes Father    Heart attack Sister    Diabetes Maternal Grandmother    Hypertension Maternal Grandmother    Colon cancer Neg Hx    Esophageal cancer Neg Hx    Rectal cancer Neg Hx    Stomach cancer Neg Hx    PE: BP 120/60   Pulse 79   Ht 5' 3 (1.6 m)   Wt 186 lb 3.2 oz (84.5 kg)   SpO2 98%   BMI 32.98 kg/m . Wt Readings  from Last 30 Encounters:  08/09/24 186 lb 3.2 oz (84.5 kg)  07/21/24 187 lb (84.8 kg)  07/19/24 188 lb 4 oz (85.4 kg)  07/17/24 186 lb 6.4 oz (84.6 kg)  07/04/24 181 lb 9.6 oz (82.4 kg)  05/23/24 182 lb (82.6 kg)  04/11/24 173 lb 3.2 oz (78.6 kg)  03/29/24 168 lb (76.2 kg)  01/14/24 170 lb 3.2 oz (77.2 kg)  01/13/24 175 lb (79.4 kg)  01/04/24 168 lb 3.2 oz (76.3 kg)  12/31/23 172 lb 3.2 oz (78.1 kg)  12/02/23 169 lb 12.8 oz (77 kg)  11/26/23 167 lb (75.8 kg)  11/22/23 174 lb (78.9 kg)  10/26/23 162 lb 9.6 oz (73.8 kg)  10/01/23 162 lb 9.6 oz (73.8 kg)  08/31/23 153 lb 9.6 oz (69.7 kg)  08/23/23 153 lb (69.4 kg)  07/22/23 153 lb (69.4 kg)  06/22/23 153 lb 4 oz (69.5 kg)  06/11/23 150 lb 12.8 oz (68.4 kg)  06/02/23 154 lb 12.2 oz (70.2 kg)  05/25/23 158 lb (71.7 kg)  04/20/23 155 lb (70.3 kg)  02/26/23 174 lb 13.2 oz (79.3 kg)  02/25/23 166 lb (75.3 kg)  02/15/23 164 lb (74.4 kg)  02/11/23 165 lb (74.8 kg)  11/12/22 171 lb 3.2 oz (77.7 kg)   Constitutional: overweight, walks with a cane Eyes: no exophthalmos ENT: no masses palpated in neck, no cervical lymphadenopathy Cardiovascular: RRR, No MRG Respiratory: CTA B Musculoskeletal: no deformities Skin: no  rashes Neurological: no tremor with outstretched hands  ASSESSMENT: 1. DM2, insulin -dependent, uncontrolled, with complications - peripheral neuropathy - DR   2. Overweight  3. HL  PLAN:  1. Patient with longstanding, uncontrolled, type 2 diabetes, on metformin , SGLT2 inhibitor and basal/bolus insulin  regimen, with worsening blood sugar in the past, to 14.4% after she had to come off Ozempic  due to N/V/AP (with normal lipase).  Afterwards, sugars started to improve to an HbA1c of 7.7%, but at last visit, after restarting drinking sweet drinks, her HbA1c was high again, at 12.1%.  We discussed about the absolute need to stop sweet drinks and to improve diet but we did not change her regimen.  She had another HbA1c obtained 1.5 months ago and this was lower, at 8.5%. CGM interpretation: -At today's visit, we reviewed her CGM downloads: It appears that  61%  of values are in target range (goal >70%), while 36% are higher than 180 (goal <25%), and 3% are lower than 70 (goal <4%).  The calculated average blood sugar is 167.  The projected HbA1c for the next 3 months (GMI) is 7.3%. -Reviewing the CGM trends, sugars have improved, with the majority of the blood sugars fluctuating within the target range, but with hyperglycemic spikes and occasional drops in blood sugars especially in the second half of the day.  Upon questioning, she mentions that she is taking her NovoLog  15 minutes before breakfast, but she is not consistent with this later in the day.  As a consequence, sugars are increasing immediately postprandially, and dropping abruptly, with the lowest blood sugars around 4-5 PM.  At today's visit, I did not recommend a change in dose, but we did discuss about trying her best to inject NovoLog  15 minutes before each meal.  I did advise her that if she has to take the insulin  later, when the sugars are already high, to decrease the dose of NovoLog .  I again advised her to try her best to stay away  from sweet drinks.  She is now  drinking water with artificial sweetener. - I advised her to: Patient Instructions  Please continue: - Metformin  ER 1000 mg 2x a day - Farxiga  10 mg before b'fast - Lantus  32 units daily - Novolog  12 units 3x day  Take NovoLog  15 min before meals! If you need to take it after a meal, may need a lower dose.    Please return in 3 months.  - advised to check sugars at different times of the day - 4x a day, rotating check times - advised for yearly eye exams >> she is UTD - Will check an ACR today - return to clinic in 3 months  2. Overweight -We tried Ozempic  in the past but she had to come off due to nausea/vomiting/abdominal pain and a high lipase in 05/2021.  Afterwards, she really wanted to restart this again.  We discussed about possible side effects and possible alarm symptoms and we started her back on a low-dose of Ozempic .  However, she was admitted in 05/2023 with intractable nausea/vomiting/abdominal pain (at that time, she also reported daily marijuana use - was advised to stop).  She came off Ozempic  at that time. - Before last year, she lost approximately 40 pounds, possibly due to glucotoxicity - Afterwards, she gained 33 pounds back, of which 13 before last visit - she gained 4 pounds since last visit  3. HL - Latest lipid panel was reviewed from 1.5 months ago: All fractions at goal, with the LDL tremendously improved, from 214 to 47: Lab Results  Component Value Date   CHOL 115 06/26/2024   HDL 43 06/26/2024   LDLCALC 47 06/26/2024   LDLDIRECT 94.0 09/25/2015   TRIG 145 06/26/2024   CHOLHDL 2.7 06/26/2024  - She continues on Lipitor 40 mg daily and Zetia  10 mg daily without side effects  Lela Fendt, MD PhD [facility] Endocr[facility]

## 2024-08-10 ENCOUNTER — Ambulatory Visit: Payer: Self-pay | Admitting: Internal Medicine

## 2024-08-10 LAB — MICROALBUMIN / CREATININE URINE RATIO
Creatinine, Urine: 97 mg/dL (ref 20–275)
Microalb, Ur: <0.2 mg/dL

## 2024-08-14 ENCOUNTER — Encounter: Payer: Self-pay | Admitting: Physician Assistant

## 2024-08-16 ENCOUNTER — Ambulatory Visit: Admitting: Gastroenterology

## 2024-08-16 ENCOUNTER — Encounter: Payer: Self-pay | Admitting: Gastroenterology

## 2024-08-16 MED ORDER — SODIUM CHLORIDE 0.9 % IV SOLN: 500.0000 mL | INTRAVENOUS | Status: AC

## 2024-08-16 NOTE — Progress Notes (Unsigned)
 During admission interview pt stated she ate a cube steak sandwich at 8am today. Discussed with MD and CRNA and had planned to attempt EGD at 4p ( 8 hours after eating). CRNA discussed risk of having food still in the stomach d/t diabetes medications and if there was food, the procedure would have to be aborted. Pt decided to reschedule procedure to ensure there would be no food in her stomach. Rescheduled for 08/23/24 at 3p.

## 2024-08-16 NOTE — Progress Notes (Unsigned)
 Procedure cancelled - patient ate steak sandwich < 8 hours ago. At risk for gastroparesis, rescheduled for another time.

## 2024-08-23 ENCOUNTER — Ambulatory Visit: Admitting: Gastroenterology

## 2024-08-23 DIAGNOSIS — R112 Nausea with vomiting, unspecified: Secondary | ICD-10-CM

## 2024-08-23 MED ORDER — SODIUM CHLORIDE 0.9 % IV SOLN: 500.0000 mL | Freq: Once | INTRAVENOUS | Status: DC

## 2024-08-23 NOTE — Progress Notes (Signed)
 This is the second straight visit she presented for an EGD after consumption of either food or liquid. I offered to do the procedure for 2 hours after her arrival time and being NPO, which would have delayed her procedure only 15 minutes, as she arrived early, but she left. She will be allowed to try scheduling this once more. If she does not follow the rules for NPO status again she will not be allowed to reschedule another exam with us 

## 2024-08-23 NOTE — Progress Notes (Signed)
 Patient to  be rescheduled due to drank prior to procedure and not wanting to wait 2 hours. Transportation reason.

## 2024-08-24 ENCOUNTER — Ambulatory Visit: Attending: Cardiology | Admitting: Pharmacist

## 2024-08-24 DIAGNOSIS — Z794 Long term (current) use of insulin: Secondary | ICD-10-CM | POA: Diagnosis not present

## 2024-08-24 DIAGNOSIS — E1165 Type 2 diabetes mellitus with hyperglycemia: Secondary | ICD-10-CM | POA: Diagnosis not present

## 2024-08-24 NOTE — Progress Notes (Signed)
 Patient ID: [name]                 DOB: 01-08-1961                    MRN: [id number]     HPI: [name] is a 63 y.o. female patient referred to pharmacy clinic by Dr. Ladona to initiate GLP1-RA therapy. PMH is significant for diabetes mellitus, hyperlipidemia, prior cigarette smoking quit in 2013, smokes marijuana occasionally, CAD, NSTEMI 2020 and obesity. Patient is also seen by Herlene, PharmD at [facility] for DM management as well as Dr. Trixie for DM.  Today patient reports to pharmacy clinic for medication management. Dr. Ladona referred patient for Rybelsus  titration. Patient states that she is doing well. Reports having an episode of vomiting yesterday. She ate food, took her medications, and proceeded to vomit. She notes that she was very hungry and ate a lot of food.   She has been trying to get an EGD, but has presented to each appointment after eating or drinking liquids. Her next appointment is 9/29.  She has previously been on Ozempic  at the max dose. She experienced nausea, vomiting, and diarrhea. She was hospitalized in July 2024 for dehydration related to Ozempic . She recalled having a lot of diarrhea during this time. It appears ozempic  has been stopped and restarted a few times due to s/e and hospitalization.  Patient takes her medications at the same time. Counseled on how to take Rybelsus  on an empty stomach with about 4 ounces of water, typically 30 minutes before meals and other medications.    Diet: eats everything, did not discuss what she eats  Exercise: not assessed  Family History:  Family History  Problem Relation Age of Onset   Hypertension Mother    Diabetes Mother    Heart attack Mother    Stroke Father    Diabetes Father    Heart attack Sister    Diabetes Maternal Grandmother    Hypertension Maternal Grandmother    Colon cancer Neg Hx    Esophageal cancer Neg Hx    Rectal cancer Neg Hx    Stomach cancer Neg Hx    Social  History: not assessed  Labs: Lab Results  Component Value Date   HGBA1C 8.5 (A) 06/26/2024    Wt Readings from Last 1 Encounters:  08/09/24 186 lb 3.2 oz (84.5 kg)    BP Readings from Last 1 Encounters:  08/09/24 120/60   Pulse Readings from Last 1 Encounters:  08/09/24 79       Component Value Date/Time   CHOL 115 06/26/2024 1359   TRIG 145 06/26/2024 1359   HDL 43 06/26/2024 1359   CHOLHDL 2.7 06/26/2024 1359   CHOLHDL 4.4 10/09/2019 1000   VLDL 26 10/09/2019 1000   LDLCALC 47 06/26/2024 1359   LDLCALC 150 (H) 12/21/2018 1706   LDLDIRECT 94.0 09/25/2015 0956    Past Medical History:  Diagnosis Date   Arthritis    Campylobacter enteritis 03/2011   [facility] admission   Carpal tunnel syndrome, bilateral    Diabetes mellitus    Gastritis with bleeding due to alcohol 2010   [facility] admission   GERD (gastroesophageal reflux disease)    HIDRADENITIS SUPPURATIVA 04/11/2007   Qualifier: Diagnosis of   By: Celinda MD, Erle Clunes        Hyperlipidemia    Hypertension    no meds   Menopause    Myocardial infarction (  HCC)    Neck pain    Wears glasses     Current Outpatient Medications on File Prior to Visit  Medication Sig Dispense Refill   Ascorbic Acid (VITAMIN C PO) Take 1 tablet by mouth daily.     aspirin  81 MG chewable tablet Chew 1 tablet (81 mg total) by mouth 2 (two) times daily. 30 tablet 0   atorvastatin  (LIPITOR) 40 MG tablet Take 1 tablet (40 mg total) by mouth daily. 90 tablet 3   Blood Glucose Monitoring Suppl (ACCU-CHEK AVIVA PLUS) w/Device KIT Use to check blood sugar 2 times a day 1 kit 0   Continuous Glucose Receiver (FREESTYLE LIBRE 3 READER) DEVI Use to check blood sugar continuously throughout. Change sensor every 15 days. 1 each 0   Continuous Glucose Sensor (FREESTYLE LIBRE 3 PLUS SENSOR) MISC Use to check blood sugar continuously throughout. Change sensor every 15 days. 2 each 6   cyanocobalamin  (VITAMIN B12) 1000 MCG tablet Take 1 tablet (1,000  mcg total) by mouth daily. 90 tablet 0   diclofenac  Sodium (VOLTAREN ) 1 % GEL Apply 4 g topically 4 (four) times daily. 350 g 1   ezetimibe  (ZETIA ) 10 MG tablet Take 1 tablet (10 mg total) by mouth daily. 90 tablet 3   FARXIGA  10 MG TABS tablet Take 1 tablet (10 mg total) by mouth daily. 90 tablet 0   gabapentin  (NEURONTIN ) 600 MG tablet Take 1 tablet (600 mg total) by mouth 3 (three) times daily. 90 tablet 5   glucose blood (BAYER CONTOUR TEST) test strip TEST BLOOD SUGARS 3 TIMES DAILY 200 each 11   glucose blood test strip Use to check blood sugar 2 times a day 100 each 12   HYDROcodone -acetaminophen  (NORCO/VICODIN) 5-325 MG tablet Take 1 tablet by mouth every 6 (six) hours as needed for moderate pain (pain score 4-6). 30 tablet 0   HYDROmorphone  (DILAUDID ) 2 MG tablet Take 2 mg by mouth as needed for severe pain.     hydrOXYzine  (ATARAX ) 25 MG tablet TAKE 1 TABLET BY MOUTH THREE TIMES DAILY AS NEEDED FOR  ITCHING 90 tablet 0   ibuprofen  (ADVIL ) 800 MG tablet Take 800 mg by mouth 3 (three) times daily.     insulin  aspart (NOVOLOG  FLEXPEN) 100 UNIT/ML FlexPen Inject 12 Units into the skin 3 (three) times daily with meals. 30 mL 3   insulin  glargine (LANTUS  SOLOSTAR) 100 UNIT/ML Solostar Pen Inject 36 Units into the skin daily. 30 mL 3   Insulin  Pen Needle (PEN NEEDLES) 32G X 4 MM MISC Use four times a day with insulin . 400 each 2   Lancet Devices (EASY TOUCH LANCING DEVICE) MISC Use to check diabetes 4 times daily  E 11.65 1 each 11   losartan  (COZAAR ) 25 MG tablet Take 1 tablet (25 mg total) by mouth every evening. 90 tablet 3   metFORMIN  (GLUCOPHAGE -XR) 500 MG 24 hr tablet Take 2 tablets (1,000 mg total) by mouth daily with supper. 180 tablet 3   metoCLOPramide  (REGLAN ) 10 MG tablet Take 1 tablet (10 mg total) by mouth every 8 (eight) hours as needed for nausea. 30 tablet 0   metoprolol  succinate (TOPROL -XL) 50 MG 24 hr tablet TAKE 1 TABLET BY MOUTH ONCE DAILY. TAKE  WITH  OR  IMMEDIATELY   FOLLOWING  A  MEAL 90 tablet 0   naloxone  (NARCAN ) nasal spray 4 mg/0.1 mL Place 1 spray into the nose as needed (or).     naproxen  (NAPROSYN ) 500 MG tablet Take  1 tablet (500 mg total) by mouth 2 (two) times daily with a meal. 60 tablet 2   ondansetron  (ZOFRAN -ODT) 8 MG disintegrating tablet Take 1 tablet (8 mg total) by mouth every 8 (eight) hours as needed for nausea or vomiting. 10 tablet 0   pantoprazole  (PROTONIX ) 40 MG tablet Take 1 tablet by mouth twice daily 180 tablet 0   Semaglutide  (RYBELSUS ) 3 MG TABS Take 1 tablet (3 mg total) by mouth daily. 30 tablet 1   tiZANidine  (ZANAFLEX ) 4 MG tablet TAKE 1 TABLET BY MOUTH EVERY 6 HOURS AS NEEDED FOR MUSCLE SPASM 90 tablet 0   traMADol  (ULTRAM ) 50 MG tablet Take 50 mg by mouth every 6 (six) hours as needed.     triamcinolone  cream (KENALOG ) 0.1 % Apply 1 Application topically daily.     Current Facility-Administered Medications on File Prior to Visit  Medication Dose Route Frequency Provider Last Rate Last Admin   0.9 %  sodium chloride  infusion  500 mL Intravenous Once Armbruster, Elspeth SQUIBB, MD        Allergies  Allergen Reactions   Oxycodone -Acetaminophen  Other (See Comments)    acetaminophen  / oxycodone    Penicillins Rash     Assessment/Plan:  1. Type 2 Diabetes Mellitus T2DM is not at goal with A1c 8.5% Patient has history of recurrent vomiting episodes. EGD scheduled with GI to determine cause Discussed proper administration of Rybelsus  with empty stomach and 4 oz water Will defer titration to next DM appointment with Herlene, PharmD on 9/29 after EGD and continued proper administration  Halie Au, PharmD PGY1 Pharmacy Resident [phone]   Sion Reinders D Eletha Culbertson, Pharm.JONETTA SARAN, CPP [facility] A Division of [facility] [address], [place], [place] [address]  Phone: [phone]; Fax: [phone]

## 2024-08-25 ENCOUNTER — Telehealth: Payer: Self-pay | Admitting: Gastroenterology

## 2024-08-25 ENCOUNTER — Telehealth: Payer: Self-pay | Admitting: Family Medicine

## 2024-08-25 NOTE — Telephone Encounter (Signed)
 RN returned call to patient daughter who is patient's primary contact. Daughter wanted to ensure she had the correct prep instructions for her mother's EGD procedure on 9/29 at 8:30 am.   RN reviewed all necessary information; daughter repeated back all instructions, stating understanding. All questions were answered.

## 2024-08-25 NOTE — Telephone Encounter (Signed)
 Received a call patients daughter requesting nurse fu call at [phone] to discuss prep instructions for patient. Patient has procedure 9/29, Please review and advise    Thank you

## 2024-08-25 NOTE — Telephone Encounter (Signed)
 LVM to confirm appt for 9/29

## 2024-08-25 NOTE — Telephone Encounter (Signed)
 Received a call patients daughter requesting nurse fu call to discuss prep instructions for patient. Patient has procedure 9/29, Please review and advise   Thank you

## 2024-08-28 ENCOUNTER — Encounter: Payer: Self-pay | Admitting: Pharmacist

## 2024-08-28 ENCOUNTER — Other Ambulatory Visit: Payer: Self-pay

## 2024-08-28 ENCOUNTER — Ambulatory Visit: Attending: Family Medicine | Admitting: Pharmacist

## 2024-08-28 ENCOUNTER — Ambulatory Visit (AMBULATORY_SURGERY_CENTER): Admitting: Gastroenterology

## 2024-08-28 ENCOUNTER — Encounter: Payer: Self-pay | Admitting: Gastroenterology

## 2024-08-28 VITALS — BP 117/63 | HR 66 | Temp 97.0°F | Resp 14 | Ht 63.0 in | Wt 186.0 lb

## 2024-08-28 DIAGNOSIS — E1165 Type 2 diabetes mellitus with hyperglycemia: Secondary | ICD-10-CM

## 2024-08-28 DIAGNOSIS — K31A15 Gastric intestinal metaplasia without dysplasia, involving multiple sites: Secondary | ICD-10-CM | POA: Diagnosis not present

## 2024-08-28 DIAGNOSIS — I2510 Atherosclerotic heart disease of native coronary artery without angina pectoris: Secondary | ICD-10-CM

## 2024-08-28 DIAGNOSIS — I739 Peripheral vascular disease, unspecified: Secondary | ICD-10-CM

## 2024-08-28 DIAGNOSIS — R112 Nausea with vomiting, unspecified: Secondary | ICD-10-CM

## 2024-08-28 DIAGNOSIS — K219 Gastro-esophageal reflux disease without esophagitis: Secondary | ICD-10-CM

## 2024-08-28 DIAGNOSIS — Z794 Long term (current) use of insulin: Secondary | ICD-10-CM | POA: Diagnosis not present

## 2024-08-28 DIAGNOSIS — K2950 Unspecified chronic gastritis without bleeding: Secondary | ICD-10-CM

## 2024-08-28 DIAGNOSIS — R1084 Generalized abdominal pain: Secondary | ICD-10-CM

## 2024-08-28 DIAGNOSIS — Z7984 Long term (current) use of oral hypoglycemic drugs: Secondary | ICD-10-CM | POA: Diagnosis not present

## 2024-08-28 DIAGNOSIS — K449 Diaphragmatic hernia without obstruction or gangrene: Secondary | ICD-10-CM | POA: Diagnosis not present

## 2024-08-28 DIAGNOSIS — D132 Benign neoplasm of duodenum: Secondary | ICD-10-CM

## 2024-08-28 DIAGNOSIS — K317 Polyp of stomach and duodenum: Secondary | ICD-10-CM

## 2024-08-28 MED ORDER — NOVOLOG FLEXPEN 100 UNIT/ML ~~LOC~~ SOPN
12.0000 [IU] | PEN_INJECTOR | Freq: Three times a day (TID) | SUBCUTANEOUS | 3 refills | Status: DC
  Filled 2024-08-28: qty 30, 50d supply, fill #0
  Filled 2024-11-03: qty 30, 83d supply, fill #0

## 2024-08-28 MED ORDER — ASPIRIN 81 MG PO CHEW
81.0000 mg | CHEWABLE_TABLET | Freq: Every day | ORAL | 3 refills | Status: DC
  Filled 2024-08-28: qty 30, 30d supply, fill #0
  Filled 2024-09-29: qty 30, 30d supply, fill #1
  Filled 2024-11-27: qty 30, 30d supply, fill #2
  Filled 2024-12-27: qty 30, 30d supply, fill #3

## 2024-08-28 MED ORDER — LANTUS SOLOSTAR 100 UNIT/ML ~~LOC~~ SOPN
36.0000 [IU] | PEN_INJECTOR | Freq: Every day | SUBCUTANEOUS | 3 refills | Status: DC
  Filled 2024-08-28: qty 30, 83d supply, fill #0

## 2024-08-28 MED ORDER — SODIUM CHLORIDE 0.9 % IV SOLN: 500.0000 mL | Freq: Once | INTRAVENOUS | Status: DC

## 2024-08-28 MED ORDER — METOCLOPRAMIDE HCL 5 MG PO TABS: 5.0000 mg | ORAL_TABLET | Freq: Three times a day (TID) | ORAL | 0 refills | Status: DC

## 2024-08-28 MED ORDER — RYBELSUS 3 MG PO TABS
1.0000 | ORAL_TABLET | Freq: Every day | ORAL | 1 refills | Status: DC
  Filled 2024-08-28: qty 30, 30d supply, fill #0
  Filled 2024-09-29: qty 30, 30d supply, fill #1

## 2024-08-28 MED ORDER — PANTOPRAZOLE SODIUM 40 MG PO TBEC
40.0000 mg | DELAYED_RELEASE_TABLET | Freq: Two times a day (BID) | ORAL | 0 refills | Status: DC
  Filled 2024-08-28: qty 180, 90d supply, fill #0

## 2024-08-28 NOTE — Progress Notes (Signed)
 Report given to PACU, vss

## 2024-08-28 NOTE — Progress Notes (Signed)
 [facility] History and Physical   Primary Care Physician:  Tanda Bleacher, MD   Reason for Procedure:   Nausea / vomiting, GERD, upper abdominal discomfort  Plan:    EGD     HPI: [name] is a 63 y.o. female  here for EGD to evaluate upper tract symptoms as outlined. Symptoms of nausea, GERD, upper abdominal pain. History of Rybelsus  use - on protonix  40mg  BID, and has been on Reglan  PRN, does not sound like she has used it recently. She thinks she had an EGD many years ago, no records of that on file.  Otherwise feels well without any cardiopulmonary symptoms. She wishes to proceed.   I have discussed risks / benefits of anesthesia and endoscopic procedure with [name] and they wish to proceed with the exams as outlined today.    Past Medical History:  Diagnosis Date   Arthritis    Campylobacter enteritis 03/2011   [facility] admission   Carpal tunnel syndrome, bilateral    Diabetes mellitus    Gastritis with bleeding due to alcohol 2010   [facility] admission   GERD (gastroesophageal reflux disease)    HIDRADENITIS SUPPURATIVA 04/11/2007   Qualifier: Diagnosis of   By: Celinda MD, Erle Clunes        Hyperlipidemia    Hypertension    no meds   Menopause    Myocardial infarction [facility])    Neck pain    Wears glasses     Past Surgical History:  Procedure Laterality Date   CARPAL TUNNEL RELEASE Left 05/14/2014   Procedure: LEFT ULNAR NEUROPLASTY AT ELBOW AND ENDOSCOPIC CARPAL TUNNEL RELEASE;  Surgeon: Alm DELENA Hummer, MD;  Location: [facility];  Service: Orthopedics;  Laterality: Left;   CORONARY STENT INTERVENTION N/A 10/09/2019   Procedure: CORONARY STENT INTERVENTION;  Surgeon: Ladona Heinz, MD;  Location: [facility];  Service: Cardiovascular;  Laterality: N/A;   CYST EXCISION  1995   under arms   endometrial biopsy  2004   benign   FOOT OSTEOTOMY  2010   right-spurs   LEFT HEART CATH AND CORONARY ANGIOGRAPHY N/A 10/09/2019    Procedure: LEFT HEART CATH AND CORONARY ANGIOGRAPHY;  Surgeon: Ladona Heinz, MD;  Location: [facility];  Service: Cardiovascular;  Laterality: N/A;   TONSILLECTOMY     TOTAL KNEE ARTHROPLASTY Left 02/26/2023   Procedure: LEFT TOTAL KNEE ARTHROPLASTY;  Surgeon: Vernetta Lonni GRADE, MD;  Location: [facility];  Service: Orthopedics;  Laterality: Left;  Needs RNFA   ULNAR NERVE TRANSPOSITION Left 05/14/2014   Procedure: ULNAR NERVE DECOMPRESSION/TRANSPOSITION;  Surgeon: Alm DELENA Hummer, MD;  Location: [facility];  Service: Orthopedics;  Laterality: Left;    Prior to Admission medications   Medication Sig Start Date End Date Taking? Authorizing Provider  aspirin  81 MG chewable tablet Chew 1 tablet (81 mg total) by mouth 2 (two) times daily. 02/27/23  Yes Vernetta Lonni GRADE, MD  atorvastatin  (LIPITOR) 40 MG tablet Take 1 tablet (40 mg total) by mouth daily. 05/01/24  Yes Newlin, Enobong, MD  cyanocobalamin  (VITAMIN B12) 1000 MCG tablet Take 1 tablet (1,000 mcg total) by mouth daily. 05/25/23  Yes Tanda Bleacher, MD  ezetimibe  (ZETIA ) 10 MG tablet Take 1 tablet (10 mg total) by mouth daily. 05/01/24  Yes Newlin, Enobong, MD  FARXIGA  10 MG TABS tablet Take 1 tablet (10 mg total) by mouth daily. 05/01/24  Yes Newlin, Enobong, MD  gabapentin  (NEURONTIN ) 600 MG tablet Take 1  tablet (600 mg total) by mouth 3 (three) times daily. 08/04/24  Yes Tanda Bleacher, MD  HYDROcodone -acetaminophen  (NORCO/VICODIN) 5-325 MG tablet Take 1 tablet by mouth every 6 (six) hours as needed for moderate pain (pain score 4-6). 09/22/23  Yes Gretta Bertrum ORN, PA-C  HYDROmorphone  (DILAUDID ) 2 MG tablet Take 2 mg by mouth as needed for severe pain.   Yes [provider]  hydrOXYzine  (ATARAX ) 25 MG tablet TAKE 1 TABLET BY MOUTH THREE TIMES DAILY AS NEEDED FOR  ITCHING 03/24/24  Yes Tanda Bleacher, MD  ibuprofen  (ADVIL ) 800 MG tablet Take 800 mg by mouth 3 (three) times daily. 07/15/24  Yes [provider]  insulin  aspart (NOVOLOG  FLEXPEN) 100 UNIT/ML FlexPen Inject 12 Units into the skin 3 (three) times daily with meals. 05/23/24  Yes Trixie File, MD  insulin  glargine (LANTUS  SOLOSTAR) 100 UNIT/ML Solostar Pen Inject 36 Units into the skin daily. 05/23/24  Yes Trixie File, MD  losartan  (COZAAR ) 25 MG tablet Take 1 tablet (25 mg total) by mouth every evening. 05/01/24  Yes Newlin, Enobong, MD  metFORMIN  (GLUCOPHAGE -XR) 500 MG 24 hr tablet Take 2 tablets (1,000 mg total) by mouth daily with supper. 05/01/24  Yes Newlin, Enobong, MD  metoCLOPramide  (REGLAN ) 10 MG tablet Take 1 tablet (10 mg total) by mouth every 8 (eight) hours as needed for nausea. 06/04/23  Yes Rizwan, Saima, MD  metoprolol  succinate (TOPROL -XL) 50 MG 24 hr tablet TAKE 1 TABLET BY MOUTH ONCE DAILY. TAKE  WITH  OR  IMMEDIATELY  FOLLOWING  A  MEAL 12/31/23  Yes Lorren, Amy J, NP  ondansetron  (ZOFRAN -ODT) 8 MG disintegrating tablet Take 1 tablet (8 mg total) by mouth every 8 (eight) hours as needed for nausea or vomiting. 05/31/23  Yes Bernard Drivers, MD  pantoprazole  (PROTONIX ) 40 MG tablet Take 1 tablet by mouth twice daily 03/24/24  Yes Tanda Bleacher, MD  Ascorbic Acid (VITAMIN C PO) Take 1 tablet by mouth daily. Patient not taking: Reported on 08/28/2024    [provider]  Blood Glucose Monitoring Suppl (ACCU-CHEK AVIVA PLUS) w/Device KIT Use to check blood sugar 2 times a day 06/04/23   Tanda Bleacher, MD  Continuous Glucose Receiver (FREESTYLE LIBRE 3 READER) DEVI Use to check blood sugar continuously throughout. Change sensor every 15 days. 06/26/24   Newlin, Enobong, MD  Continuous Glucose Sensor (FREESTYLE LIBRE 3 PLUS SENSOR) MISC Use to check blood sugar continuously throughout. Change sensor every 15 days. 06/26/24   Newlin, Enobong, MD  diclofenac  Sodium (VOLTAREN ) 1 % GEL Apply 4 g topically 4 (four) times daily. 07/04/24   Tanda Bleacher, MD  glucose blood (BAYER CONTOUR TEST) test strip TEST BLOOD SUGARS 3 TIMES  DAILY 05/25/23   Tanda Bleacher, MD  glucose blood test strip Use to check blood sugar 2 times a day 06/04/23   Tanda Bleacher, MD  Insulin  Pen Needle (PEN NEEDLES) 32G X 4 MM MISC Use four times a day with insulin . 05/01/24   Delbert Clam, MD  Lancet Devices (EASY TOUCH LANCING DEVICE) MISC Use to check diabetes 4 times daily  E 11.65 05/25/23   Tanda Bleacher, MD  naloxone  (NARCAN ) nasal spray 4 mg/0.1 mL Place 1 spray into the nose as needed (or). 12/09/20   [provider]  naproxen  (NAPROSYN ) 500 MG tablet Take 1 tablet (500 mg total) by mouth 2 (two) times daily with a meal. 04/11/24   Tanda Bleacher, MD  Semaglutide  (RYBELSUS ) 3 MG TABS Take 1 tablet (3 mg total)  by mouth daily. 07/21/24   Ladona Heinz, MD  tiZANidine  (ZANAFLEX ) 4 MG tablet TAKE 1 TABLET BY MOUTH EVERY 6 HOURS AS NEEDED FOR MUSCLE SPASM 03/24/24   Tanda Bleacher, MD  traMADol  (ULTRAM ) 50 MG tablet Take 50 mg by mouth every 6 (six) hours as needed. 08/15/24   [provider]  triamcinolone  cream (KENALOG ) 0.1 % Apply 1 Application topically daily.    [provider]    Current Outpatient Medications  Medication Sig Dispense Refill   aspirin  81 MG chewable tablet Chew 1 tablet (81 mg total) by mouth 2 (two) times daily. 30 tablet 0   atorvastatin  (LIPITOR) 40 MG tablet Take 1 tablet (40 mg total) by mouth daily. 90 tablet 3   cyanocobalamin  (VITAMIN B12) 1000 MCG tablet Take 1 tablet (1,000 mcg total) by mouth daily. 90 tablet 0   ezetimibe  (ZETIA ) 10 MG tablet Take 1 tablet (10 mg total) by mouth daily. 90 tablet 3   FARXIGA  10 MG TABS tablet Take 1 tablet (10 mg total) by mouth daily. 90 tablet 0   gabapentin  (NEURONTIN ) 600 MG tablet Take 1 tablet (600 mg total) by mouth 3 (three) times daily. 90 tablet 5   HYDROcodone -acetaminophen  (NORCO/VICODIN) 5-325 MG tablet Take 1 tablet by mouth every 6 (six) hours as needed for moderate pain (pain score 4-6). 30 tablet 0   HYDROmorphone  (DILAUDID ) 2 MG tablet  Take 2 mg by mouth as needed for severe pain.     hydrOXYzine  (ATARAX ) 25 MG tablet TAKE 1 TABLET BY MOUTH THREE TIMES DAILY AS NEEDED FOR  ITCHING 90 tablet 0   ibuprofen  (ADVIL ) 800 MG tablet Take 800 mg by mouth 3 (three) times daily.     insulin  aspart (NOVOLOG  FLEXPEN) 100 UNIT/ML FlexPen Inject 12 Units into the skin 3 (three) times daily with meals. 30 mL 3   insulin  glargine (LANTUS  SOLOSTAR) 100 UNIT/ML Solostar Pen Inject 36 Units into the skin daily. 30 mL 3   losartan  (COZAAR ) 25 MG tablet Take 1 tablet (25 mg total) by mouth every evening. 90 tablet 3   metFORMIN  (GLUCOPHAGE -XR) 500 MG 24 hr tablet Take 2 tablets (1,000 mg total) by mouth daily with supper. 180 tablet 3   metoCLOPramide  (REGLAN ) 10 MG tablet Take 1 tablet (10 mg total) by mouth every 8 (eight) hours as needed for nausea. 30 tablet 0   metoprolol  succinate (TOPROL -XL) 50 MG 24 hr tablet TAKE 1 TABLET BY MOUTH ONCE DAILY. TAKE  WITH  OR  IMMEDIATELY  FOLLOWING  A  MEAL 90 tablet 0   ondansetron  (ZOFRAN -ODT) 8 MG disintegrating tablet Take 1 tablet (8 mg total) by mouth every 8 (eight) hours as needed for nausea or vomiting. 10 tablet 0   pantoprazole  (PROTONIX ) 40 MG tablet Take 1 tablet by mouth twice daily 180 tablet 0   Ascorbic Acid (VITAMIN C PO) Take 1 tablet by mouth daily. (Patient not taking: Reported on 08/28/2024)     Blood Glucose Monitoring Suppl (ACCU-CHEK AVIVA PLUS) w/Device KIT Use to check blood sugar 2 times a day 1 kit 0   Continuous Glucose Receiver (FREESTYLE LIBRE 3 READER) DEVI Use to check blood sugar continuously throughout. Change sensor every 15 days. 1 each 0   Continuous Glucose Sensor (FREESTYLE LIBRE 3 PLUS SENSOR) MISC Use to check blood sugar continuously throughout. Change sensor every 15 days. 2 each 6   diclofenac  Sodium (VOLTAREN ) 1 % GEL Apply 4 g topically 4 (four) times daily. 350 g 1  glucose blood (BAYER CONTOUR TEST) test strip TEST BLOOD SUGARS 3 TIMES DAILY 200 each 11    glucose blood test strip Use to check blood sugar 2 times a day 100 each 12   Insulin  Pen Needle (PEN NEEDLES) 32G X 4 MM MISC Use four times a day with insulin . 400 each 2   Lancet Devices (EASY TOUCH LANCING DEVICE) MISC Use to check diabetes 4 times daily  E 11.65 1 each 11   naloxone  (NARCAN ) nasal spray 4 mg/0.1 mL Place 1 spray into the nose as needed (or).     naproxen  (NAPROSYN ) 500 MG tablet Take 1 tablet (500 mg total) by mouth 2 (two) times daily with a meal. 60 tablet 2   Semaglutide  (RYBELSUS ) 3 MG TABS Take 1 tablet (3 mg total) by mouth daily. 30 tablet 1   tiZANidine  (ZANAFLEX ) 4 MG tablet TAKE 1 TABLET BY MOUTH EVERY 6 HOURS AS NEEDED FOR MUSCLE SPASM 90 tablet 0   traMADol  (ULTRAM ) 50 MG tablet Take 50 mg by mouth every 6 (six) hours as needed.     triamcinolone  cream (KENALOG ) 0.1 % Apply 1 Application topically daily.     Current Facility-Administered Medications  Medication Dose Route Frequency Provider Last Rate Last Admin   0.9 %  sodium chloride  infusion  500 mL Intravenous Once Ko Bardon, Elspeth SQUIBB, MD       0.9 %  sodium chloride  infusion  500 mL Intravenous Once Simmie Camerer, Elspeth SQUIBB, MD        Allergies as of 08/28/2024 - Review Complete 08/28/2024  Allergen Reaction Noted   Oxycodone -acetaminophen  Other (See Comments) 05/26/2019   Penicillins Rash 01/31/2013    Family History  Problem Relation Age of Onset   Hypertension Mother    Diabetes Mother    Heart attack Mother    Stroke Father    Diabetes Father    Heart attack Sister    Diabetes Maternal Grandmother    Hypertension Maternal Grandmother    Colon cancer Neg Hx    Esophageal cancer Neg Hx    Rectal cancer Neg Hx    Stomach cancer Neg Hx     Social History   Socioeconomic History   Marital status: Single    Spouse name: Not on file   Number of children: 2   Years of education: 12   Highest education level: 11th grade  Occupational History   Not on file  Tobacco Use   Smoking status:  Never    Passive exposure: Past   Smokeless tobacco: Never  Vaping Use   Vaping status: Never Used  Substance and Sexual Activity   Alcohol use: Not Currently   Drug use: Yes    Types: Marijuana    Comment: sometimes; last use on Tuesday, 07/20/23   Sexual activity: Yes    Birth control/protection: None    Comment: same partner x 10 years  Other Topics Concern   Not on file  Social History Narrative   She works as a Arboriculturist.   Right-handed.   One cup caffeine  per day.   She lives at home with daughter.   Social Drivers of Health   Financial Resource Strain: Medium Risk (03/22/2024)   Overall Financial Resource Strain (CARDIA)    Difficulty of Paying Living Expenses: Somewhat hard  Food Insecurity: Food Insecurity Present (03/22/2024)   Hunger Vital Sign    Worried About Running Out of Food in the Last Year: Sometimes true    Ran Out of Food in  the Last Year: Sometimes true  Transportation Needs: No Transportation Needs (03/22/2024)   PRAPARE - Administrator, Civil Service (Medical): No    Lack of Transportation (Non-Medical): No  Physical Activity: Inactive (03/22/2024)   Exercise Vital Sign    Days of Exercise per Week: 0 days    Minutes of Exercise per Session: 0 min  Stress: Stress Concern Present (03/22/2024)   [employer] of Occupational Health - Occupational Stress Questionnaire    Feeling of Stress : To some extent  Social Connections: Moderately Integrated (03/22/2024)   Social Connection and Isolation Panel    Frequency of Communication with Friends and Family: More than three times a week    Frequency of Social Gatherings with Friends and Family: Twice a week    Attends Religious Services: 1 to 4 times per year    Active Member of [employer] or Organizations: Yes    Attends Banker Meetings: 1 to 4 times per year    Marital Status: Never married  Intimate Partner Violence: Not At Risk (05/31/2023)   Humiliation, Afraid, Rape, and Kick  questionnaire    Fear of Current or Ex-Partner: No    Emotionally Abused: No    Physically Abused: No    Sexually Abused: No    Review of Systems: All other review of systems negative except as mentioned in the HPI.  Physical Exam: Vital signs BP (!) 142/75   Pulse 81   Temp (!) 97 F (36.1 C) (Skin)   Resp (!) 100   Ht 5' 3 (1.6 m)   Wt 186 lb (84.4 kg)   BMI 32.95 kg/m   General:   Alert,  Well-developed, pleasant and cooperative in NAD Lungs:  Clear throughout to auscultation.   Heart:  Regular rate and rhythm Abdomen:  Soft, nontender and nondistended.   Neuro/Psych:  Alert and cooperative. Normal mood and affect. A and O x 3  Marcey Naval, MD [facility]

## 2024-08-28 NOTE — Progress Notes (Signed)
 Called to room to assist during endoscopic procedure.  Patient ID and intended procedure confirmed with present staff. Received instructions for my participation in the procedure from the performing physician.

## 2024-08-28 NOTE — Progress Notes (Signed)
0933 Robinul 0.1 mg IV given due large amount of secretions upon assessment.  MD made aware, vss 

## 2024-08-28 NOTE — Patient Instructions (Addendum)
 Handouts given: Polyps, Hiatal Hernia Resume previous diet. Continue present medications.  Recommend Reglan  5 mg three times a day before meals for the next few weeks. Await pathology results.  YOU HAD AN ENDOSCOPIC PROCEDURE TODAY AT THE [facility]:   Refer to the procedure report that was given to you for any specific questions about what was found during the examination.  If the procedure report does not answer your questions, please call your gastroenterologist to clarify.  If you requested that your care partner not be given the details of your procedure findings, then the procedure report has been included in a sealed envelope for you to review at your convenience later.  YOU SHOULD EXPECT: Some feelings of bloating in the abdomen. Passage of more gas than usual.  Walking can help get rid of the air that was put into your GI tract during the procedure and reduce the bloating. If you had a lower endoscopy (such as a colonoscopy or flexible sigmoidoscopy) you may notice spotting of blood in your stool or on the toilet paper. If you underwent a bowel prep for your procedure, you may not have a normal bowel movement for a few days.  Please Note:  You might notice some irritation and congestion in your nose or some drainage.  This is from the oxygen used during your procedure.  There is no need for concern and it should clear up in a day or so.  SYMPTOMS TO REPORT IMMEDIATELY:   Following upper endoscopy (EGD)  Vomiting of blood or coffee ground material  New chest pain or pain under the shoulder blades  Painful or persistently difficult swallowing  New shortness of breath  Fever of 100F or higher  Black, tarry-looking stools  For urgent or emergent issues, a gastroenterologist can be reached at any hour by calling [phone]. Do not use MyChart messaging for urgent concerns.    DIET:  We do recommend a small meal at first, but then you may proceed to your regular diet.   Drink plenty of fluids but you should avoid alcoholic beverages for 24 hours.  ACTIVITY:  You should plan to take it easy for the rest of today and you should NOT DRIVE or use heavy machinery until tomorrow (because of the sedation medicines used during the test).    FOLLOW UP: Our staff will call the number listed on your records the next business day following your procedure.  We will call around 7:15- 8:00 am to check on you and address any questions or concerns that you may have regarding the information given to you following your procedure. If we do not reach you, we will leave a message.     If any biopsies were taken you will be contacted by phone or by letter within the next 1-3 weeks.  Please call us  at [phone] if you have not heard about the biopsies in 3 weeks.    SIGNATURES/CONFIDENTIALITY: You and/or your care partner have signed paperwork which will be entered into your electronic medical record.  These signatures attest to the fact that that the information above on your After Visit Summary has been reviewed and is understood.  Full responsibility of the confidentiality of this discharge information lies with you and/or your care-partner.

## 2024-08-28 NOTE — Progress Notes (Signed)
 S:     No chief complaint on file.  63 y.o. female who presents for diabetes evaluation, education, and management. CAD sp MI in 2021 with 3 stents placed. She also has a hx of T2DM, HTN, GERD, HLD, peripheral neuropathy secondary to DM, chronic neck and back pain, arthritis, obesity, urinary incontinence. Patient has experienced elevated lipase and hospitalizations with previous use of Ozempic .   Patient was referred and last seen by Primary Care Provider, Dr. Tanda, on 07/17/2024. Pharmacy has seen her a couple of times since May. Over the course of those visits, pharmacy started her on basal + bolus insulin  and adjusted those. Patient is also being seen by Endocrinology (Dr. Trixie). No medication changes were made at last visit 08/09/24.  Patient was seen by pharmacy team in cardiology clinic last week 08/24/24 for Rybelsus  titration. Titration was deferred to PCP pharmacy team.  Patient arrives in good spirits and presents without  any assistance. Patient is accompanied by herself. Reports having an EGD today and reports doing well. She confirms her medications and taking most as prescribed. Rybelsus  was not taken properly as she was taking it with all her medications. Pharmacy team last week encouraged her to take on empty stomach with water. Patient endorses that she does not go a full day without missing medication. If she does miss a dose, she will take it later in the day when she remembers.  Patient reports hypoglycemic events. AGP report confirms that patient is having lows during the day and during the night. Attributes daytime lows to when she has not eaten anything. When she has lows during the night, she feels sweaty. Reports having candy at bedside and eat some. Denies double checking with a finger stick to confirm lows.  Family/Social History: Fhx: HTN, DM MI  Tobacco: never smoker  Alcohol: none reported   Current diabetes medications include: Lantus  36 units daily, Novolog   12 units TID, metformin  ER 1000 mg BID, Farxiga  10 mg, Rybelsus  3 mg  Insurance coverage: [employer], [place] Medicaid  Patient reports hypoglycemic events.  Patient denies nocturia (nighttime urination).  Patient reports neuropathy (nerve pain). Patient denies visual changes. Patient reports self foot exams.   Patient reported dietary habits:  -Tries to follow a proper CM diet -Eating lots of fruit   Patient-reported exercise habits: none due to chronic pain  O:   Libre3 CGM Download today 08/28/24 - 30 day report 07/30/24-08/28/24 % Time CGM is active: 96% Average Glucose: 155 mg/dL Glucose Management Indicator: 7.0  Glucose Variability: not provided in patient's LibreReport via phone app Time in Goal:  - Time in range 70-180: 66% - Time above range: 29% - Time below range: 5%   Lab Results  Component Value Date   HGBA1C 8.5 (A) 06/26/2024   There were no vitals filed for this visit.  Lipid Panel     Component Value Date/Time   CHOL 115 06/26/2024 1359   TRIG 145 06/26/2024 1359   HDL 43 06/26/2024 1359   CHOLHDL 2.7 06/26/2024 1359   CHOLHDL 4.4 10/09/2019 1000   VLDL 26 10/09/2019 1000   LDLCALC 47 06/26/2024 1359   LDLCALC 150 (H) 12/21/2018 1706   LDLDIRECT 94.0 09/25/2015 0956    Clinical Atherosclerotic Cardiovascular Disease (ASCVD): No  The ASCVD Risk score (Arnett DK, et al., 2019) failed to calculate for the following reasons:   Risk score cannot be calculated because patient has a medical history suggesting prior/existing ASCVD   A/P: Diabetes  longstanding is currently uncontrolled with most recent A1c 8.5%. This A1c is done from 12.1% in May 2025. Confirms taking insulins as prescribed. AGP report looks steady and showing improvement since seeing Dr. Vianne. Patient has reported recent hypoglycemia events. Per AGP, she had ~7 episodes of hypoglycemia over the last 3 days. This seems to be recurrent throughout her DM journey. Patient is able to  verbalize appropriate hypoglycemia management plan with peppermints and caramels. Given hypoglycemia, will decrease Lantus  to avoid further events. -Decreased dose of basal insulin  Lantus  from 36 units to 34 units daily in the morning.  -Continued rapid insulin  Novolog  (insulin  aspart) 12 units TID 15 minutes before meals. Do not give a dose if you are not eating a meal.  -Continued GLP-1 Rybelsus  (semaglutide ) 3 mg. Continue with current dose as it has not been taken correctly. Would like to evaluate to see if she tolerates it before increasing. -Continued SGLT2-I Farxiga  (dapagliflozin ) 10 mg. Counseled on sick day rules. -Continued metformin  1000 mg with dinner.  -Patient educated on purpose, proper use, and potential adverse effects of insulin  and Rybelsus .  -Extensively discussed pathophysiology of diabetes, recommended lifestyle interventions, dietary effects on blood sugar control.  -Counseled on s/sx of and management of hypoglycemia.  -Next A1c anticipated 08/2024.   Written patient instructions provided. Patient verbalized understanding of treatment plan.  Total time in face to face counseling 30 minutes.    Follow-up:  Pharmacist 10/23/24 PCP clinic visit with Dr. Tanda 09/04/24  Jenkins Graces, PharmD PGY1 Pharmacy Resident [phone]

## 2024-08-28 NOTE — Op Note (Signed)
 [facility] Patient Name: [name] Procedure Date: 08/28/2024 9:22 AM MRN: [id number] Endoscopist: Elspeth P. Leigh , MD, [id number] Age: 63 Referring MD:  Date of Birth: 1961-07-03 Gender: Female Account #: [id number] Procedure:                Upper GI endoscopy Indications:              Epigastric abdominal pain, Follow-up of                            gastro-esophageal reflux disease, Nausea - history                            of Rybelsus  use, on protonix  twice daily Medicines:                Monitored Anesthesia Care Procedure:                Pre-Anesthesia Assessment:                           - Prior to the procedure, a History and Physical                            was performed, and patient medications and                            allergies were reviewed. The patient's tolerance of                            previous anesthesia was also reviewed. The risks                            and benefits of the procedure and the sedation                            options and risks were discussed with the patient.                            All questions were answered, and informed consent                            was obtained. Prior Anticoagulants: The patient has                            taken no anticoagulant or antiplatelet agents. ASA                            Grade Assessment: III - A patient with severe                            systemic disease. After reviewing the risks and                            benefits, the patient was deemed in satisfactory  condition to undergo the procedure.                           After obtaining informed consent, the endoscope was                            passed under direct vision. Throughout the                            procedure, the patient's blood pressure, pulse, and                            oxygen saturations were monitored continuously. The                            GIF [id number]  #[id number] was introduced through the                            mouth, and advanced to the second part of duodenum.                            The upper GI endoscopy was accomplished without                            difficulty. The patient tolerated the procedure                            well. Scope In: Scope Out: Findings:                 Esophagogastric landmarks were identified: the                            Z-line was found at 34 cm, the gastroesophageal                            junction was found at 34 cm and the upper extent of                            the gastric folds was found at 38 cm from the                            incisors.                           A 4 cm hiatal hernia was present.                           The exam of the esophagus was otherwise normal.                           The entire examined stomach was normal. Biopsies                            were taken with a cold  forceps for Helicobacter                            pylori testing. Of note, due to the hiatal hernia                            and laxity at the GEJ, there was poor air retention                            in the proximal stomach. Retroflexed views of the                            cardia / fundus were quite limited in this light.                           A single 2 to 3 mm sessile polyp was found in the                            duodenal bulb. The polyp was removed with a cold                            biopsy forceps. Resection and retrieval were                            complete.                           The exam of the duodenum was otherwise normal. Complications:            No immediate complications. Estimated blood loss:                            Minimal. Estimated Blood Loss:     Estimated blood loss was minimal. Impression:               - Esophagogastric landmarks identified.                           - 4 cm hiatal hernia.                           - Normal esophagus  otherwise.                           - Normal stomach. Biopsied. Poor air retention in                            the proximal stomach due to laxity at the GEJ.                           - A single duodenal polyp. Resected and retrieved.                           - Normal duodenum otherwise. Recommendation:           -  Patient has a contact number available for                            emergencies. The signs and symptoms of potential                            delayed complications were discussed with the                            patient. Return to normal activities tomorrow.                            Written discharge instructions were provided to the                            patient.                           - Resume previous diet.                           - Continue present medications.                           - If you have not tried Reglan  recently, recommend                            5mg  TID prior to meals for the next few weeks. Will                            discuss risks / benefits and if she wishes to try                            this (has used remotely, not recently). If symptoms                            persist, may need to back off GLP1 agonist.                           - Await pathology results. Elspeth P. Donnis Phaneuf, MD 08/28/2024 9:55:06 AM This report has been signed electronically.

## 2024-08-29 ENCOUNTER — Other Ambulatory Visit: Payer: Self-pay

## 2024-08-29 ENCOUNTER — Telehealth: Payer: Self-pay

## 2024-08-29 NOTE — Telephone Encounter (Signed)
  Follow up Call-     08/28/2024    8:50 AM 08/16/2024    1:59 PM 07/22/2023   10:55 AM  Call back number  Post procedure Call Back phone  # [phone]  Permission to leave phone message Yes Yes Yes     Patient questions:  Do you have a fever, pain , or abdominal swelling? No. Pain Score  0 *  Have you tolerated food without any problems? Yes.    Have you been able to return to your normal activities? Yes.    Do you have any questions about your discharge instructions: Diet   No. Medications  No. Follow up visit  No.  Do you have questions or concerns about your Care? No.  Actions: * If pain score is 4 or above: No action needed, pain <4.

## 2024-08-31 LAB — SURGICAL PATHOLOGY

## 2024-09-03 ENCOUNTER — Ambulatory Visit: Payer: Self-pay | Admitting: Gastroenterology

## 2024-09-04 ENCOUNTER — Ambulatory Visit (INDEPENDENT_AMBULATORY_CARE_PROVIDER_SITE_OTHER): Admitting: Family Medicine

## 2024-09-04 ENCOUNTER — Other Ambulatory Visit: Payer: Self-pay

## 2024-09-04 ENCOUNTER — Encounter: Payer: Self-pay | Admitting: Family Medicine

## 2024-09-04 VITALS — BP 121/81 | HR 83 | Temp 98.5°F | Resp 16 | Ht 63.0 in | Wt 184.6 lb

## 2024-09-04 DIAGNOSIS — I10 Essential (primary) hypertension: Secondary | ICD-10-CM

## 2024-09-04 DIAGNOSIS — M25561 Pain in right knee: Secondary | ICD-10-CM | POA: Diagnosis not present

## 2024-09-04 DIAGNOSIS — Z794 Long term (current) use of insulin: Secondary | ICD-10-CM

## 2024-09-04 DIAGNOSIS — Z79899 Other long term (current) drug therapy: Secondary | ICD-10-CM

## 2024-09-04 DIAGNOSIS — Z7984 Long term (current) use of oral hypoglycemic drugs: Secondary | ICD-10-CM

## 2024-09-04 DIAGNOSIS — M25562 Pain in left knee: Secondary | ICD-10-CM | POA: Diagnosis not present

## 2024-09-04 DIAGNOSIS — Z7982 Long term (current) use of aspirin: Secondary | ICD-10-CM

## 2024-09-04 DIAGNOSIS — E1169 Type 2 diabetes mellitus with other specified complication: Secondary | ICD-10-CM

## 2024-09-04 DIAGNOSIS — E785 Hyperlipidemia, unspecified: Secondary | ICD-10-CM

## 2024-09-04 DIAGNOSIS — Z96652 Presence of left artificial knee joint: Secondary | ICD-10-CM

## 2024-09-04 NOTE — Progress Notes (Signed)
 Established Patient Office Visit  Subjective    Patient ID: [name], female    DOB: 16-Nov-1961  Age: 63 y.o. MRN: [id number]  CC:  Chief Complaint  Patient presents with   Follow-up    2 month follow up    HPI [name] presents for follow up of bilateral knee pain as well as hypertension. Patient denies acute complaints.   Outpatient Encounter Medications as of 09/04/2024  Medication Sig   aspirin  81 MG chewable tablet Chew 1 tablet (81 mg total) by mouth daily.   atorvastatin  (LIPITOR) 40 MG tablet Take 1 tablet (40 mg total) by mouth daily.   Blood Glucose Monitoring Suppl (ACCU-CHEK AVIVA PLUS) w/Device KIT Use to check blood sugar 2 times a day   Continuous Glucose Receiver (FREESTYLE LIBRE 3 READER) DEVI Use to check blood sugar continuously throughout. Change sensor every 15 days.   Continuous Glucose Sensor (FREESTYLE LIBRE 3 PLUS SENSOR) MISC Use to check blood sugar continuously throughout. Change sensor every 15 days.   cyanocobalamin  (VITAMIN B12) 1000 MCG tablet Take 1 tablet (1,000 mcg total) by mouth daily.   diclofenac  Sodium (VOLTAREN ) 1 % GEL Apply 4 g topically 4 (four) times daily.   ezetimibe  (ZETIA ) 10 MG tablet Take 1 tablet (10 mg total) by mouth daily.   FARXIGA  10 MG TABS tablet Take 1 tablet (10 mg total) by mouth daily.   gabapentin  (NEURONTIN ) 600 MG tablet Take 1 tablet (600 mg total) by mouth 3 (three) times daily.   glucose blood (BAYER CONTOUR TEST) test strip TEST BLOOD SUGARS 3 TIMES DAILY   glucose blood test strip Use to check blood sugar 2 times a day   HYDROcodone -acetaminophen  (NORCO/VICODIN) 5-325 MG tablet Take 1 tablet by mouth every 6 (six) hours as needed for moderate pain (pain score 4-6).   HYDROmorphone  (DILAUDID ) 2 MG tablet Take 2 mg by mouth as needed for severe pain.   hydrOXYzine  (ATARAX ) 25 MG tablet TAKE 1 TABLET BY MOUTH THREE TIMES DAILY AS NEEDED FOR  ITCHING   ibuprofen  (ADVIL ) 800 MG tablet Take 800 mg by mouth  3 (three) times daily.   insulin  aspart (NOVOLOG  FLEXPEN) 100 UNIT/ML FlexPen Inject 12 Units into the skin 3 (three) times daily with meals.   insulin  glargine (LANTUS  SOLOSTAR) 100 UNIT/ML Solostar Pen Inject 36 Units into the skin daily.   Insulin  Pen Needle (PEN NEEDLES) 32G X 4 MM MISC Use four times a day with insulin .   Lancet Devices (EASY TOUCH LANCING DEVICE) MISC Use to check diabetes 4 times daily  E 11.65   losartan  (COZAAR ) 25 MG tablet Take 1 tablet (25 mg total) by mouth every evening.   metFORMIN  (GLUCOPHAGE -XR) 500 MG 24 hr tablet Take 2 tablets (1,000 mg total) by mouth daily with supper.   metoCLOPramide  (REGLAN ) 5 MG tablet Take 1 tablet (5 mg total) by mouth 3 (three) times daily before meals.   metoprolol  succinate (TOPROL -XL) 50 MG 24 hr tablet TAKE 1 TABLET BY MOUTH ONCE DAILY. TAKE  WITH  OR  IMMEDIATELY  FOLLOWING  A  MEAL   naloxone  (NARCAN ) nasal spray 4 mg/0.1 mL Place 1 spray into the nose as needed (or).   naproxen  (NAPROSYN ) 500 MG tablet Take 1 tablet (500 mg total) by mouth 2 (two) times daily with a meal.   ondansetron  (ZOFRAN -ODT) 8 MG disintegrating tablet Take 1 tablet (8 mg total) by mouth every 8 (eight) hours as needed for nausea or vomiting.  Ascorbic Acid (VITAMIN C PO) Take 1 tablet by mouth daily. (Patient not taking: Reported on 08/28/2024)   pantoprazole  (PROTONIX ) 40 MG tablet Take 1 tablet (40 mg total) by mouth 2 (two) times daily.   Semaglutide  (RYBELSUS ) 3 MG TABS Take 1 tablet (3 mg total) by mouth daily.   tiZANidine  (ZANAFLEX ) 4 MG tablet TAKE 1 TABLET BY MOUTH EVERY 6 HOURS AS NEEDED FOR MUSCLE SPASM   traMADol  (ULTRAM ) 50 MG tablet Take 50 mg by mouth every 6 (six) hours as needed.   triamcinolone  cream (KENALOG ) 0.1 % Apply 1 Application topically daily.   No facility-administered encounter medications on file as of 09/04/2024.    Past Medical History:  Diagnosis Date   Arthritis    Campylobacter enteritis 03/2011   [facility] admission    Carpal tunnel syndrome, bilateral    Diabetes mellitus    Gastritis with bleeding due to alcohol 2010   [facility] admission   GERD (gastroesophageal reflux disease)    HIDRADENITIS SUPPURATIVA 04/11/2007   Qualifier: Diagnosis of   By: Celinda MD, Erle Clunes        Hyperlipidemia    Hypertension    no meds   Menopause    Myocardial infarction [facility])    Neck pain    Wears glasses     Past Surgical History:  Procedure Laterality Date   CARPAL TUNNEL RELEASE Left 05/14/2014   Procedure: LEFT ULNAR NEUROPLASTY AT ELBOW AND ENDOSCOPIC CARPAL TUNNEL RELEASE;  Surgeon: Alm DELENA Hummer, MD;  Location: [facility];  Service: Orthopedics;  Laterality: Left;   CORONARY STENT INTERVENTION N/A 10/09/2019   Procedure: CORONARY STENT INTERVENTION;  Surgeon: Ladona Heinz, MD;  Location: [facility];  Service: Cardiovascular;  Laterality: N/A;   CYST EXCISION  1995   under arms   endometrial biopsy  2004   benign   FOOT OSTEOTOMY  2010   right-spurs   LEFT HEART CATH AND CORONARY ANGIOGRAPHY N/A 10/09/2019   Procedure: LEFT HEART CATH AND CORONARY ANGIOGRAPHY;  Surgeon: Ladona Heinz, MD;  Location: [facility];  Service: Cardiovascular;  Laterality: N/A;   TONSILLECTOMY     TOTAL KNEE ARTHROPLASTY Left 02/26/2023   Procedure: LEFT TOTAL KNEE ARTHROPLASTY;  Surgeon: Vernetta Lonni GRADE, MD;  Location: [facility];  Service: Orthopedics;  Laterality: Left;  Needs RNFA   ULNAR NERVE TRANSPOSITION Left 05/14/2014   Procedure: ULNAR NERVE DECOMPRESSION/TRANSPOSITION;  Surgeon: Alm DELENA Hummer, MD;  Location: [facility];  Service: Orthopedics;  Laterality: Left;    Family History  Problem Relation Age of Onset   Hypertension Mother    Diabetes Mother    Heart attack Mother    Stroke Father    Diabetes Father    Heart attack Sister    Diabetes Maternal Grandmother    Hypertension Maternal Grandmother    Colon cancer Neg Hx    Esophageal cancer Neg Hx    Rectal  cancer Neg Hx    Stomach cancer Neg Hx     Social History   Socioeconomic History   Marital status: Single    Spouse name: Not on file   Number of children: 2   Years of education: 12   Highest education level: 11th grade  Occupational History   Not on file  Tobacco Use   Smoking status: Never    Passive exposure: Past   Smokeless tobacco: Never  Vaping Use   Vaping status: Never Used  Substance and Sexual Activity  Alcohol use: Not Currently   Drug use: Yes    Types: Marijuana    Comment: sometimes; last use on Tuesday, 07/20/23   Sexual activity: Yes    Birth control/protection: None    Comment: same partner x 10 years  Other Topics Concern   Not on file  Social History Narrative   She works as a Arboriculturist.   Right-handed.   One cup caffeine  per day.   She lives at home with daughter.   Social Drivers of Health   Financial Resource Strain: Medium Risk (03/22/2024)   Overall Financial Resource Strain (CARDIA)    Difficulty of Paying Living Expenses: Somewhat hard  Food Insecurity: Food Insecurity Present (03/22/2024)   Hunger Vital Sign    Worried About Running Out of Food in the Last Year: Sometimes true    Ran Out of Food in the Last Year: Sometimes true  Transportation Needs: No Transportation Needs (03/22/2024)   PRAPARE - Administrator, Civil Service (Medical): No    Lack of Transportation (Non-Medical): No  Physical Activity: Inactive (03/22/2024)   Exercise Vital Sign    Days of Exercise per Week: 0 days    Minutes of Exercise per Session: 0 min  Stress: Stress Concern Present (03/22/2024)   [employer] of Occupational Health - Occupational Stress Questionnaire    Feeling of Stress : To some extent  Social Connections: Moderately Integrated (03/22/2024)   Social Connection and Isolation Panel    Frequency of Communication with Friends and Family: More than three times a week    Frequency of Social Gatherings with Friends and Family: Twice  a week    Attends Religious Services: 1 to 4 times per year    Active Member of [employer] or Organizations: Yes    Attends Banker Meetings: 1 to 4 times per year    Marital Status: Never married  Intimate Partner Violence: Not At Risk (05/31/2023)   Humiliation, Afraid, Rape, and Kick questionnaire    Fear of Current or Ex-Partner: No    Emotionally Abused: No    Physically Abused: No    Sexually Abused: No    Review of Systems  All other systems reviewed and are negative.       Objective    BP 121/81   Pulse 83   Temp 98.5 F (36.9 C) (Oral)   Resp 16   Ht 5' 3 (1.6 m)   Wt 184 lb 9.6 oz (83.7 kg)   SpO2 96%   BMI 32.70 kg/m   Physical Exam Vitals and nursing note reviewed.  Constitutional:      General: She is not in acute distress. Cardiovascular:     Rate and Rhythm: Normal rate and regular rhythm.  Pulmonary:     Effort: Pulmonary effort is normal.     Breath sounds: Normal breath sounds.  Abdominal:     Palpations: Abdomen is soft.     Tenderness: There is no abdominal tenderness.  Musculoskeletal:        General: Tenderness (bilateral knees) present.     Comments: Patient utilizing cane for stability  Neurological:     General: No focal deficit present.     Mental Status: She is alert and oriented to person, place, and time.         Assessment & Plan:  1. Pain in both knees, unspecified chronicity (Primary) Continue present management  2. Essential hypertension Appears stable. continue  3. Hyperlipidemia associated with type 2 diabetes  mellitus (HCC) Continue     Return in about 2 months (around 11/04/2024).   Tanda Raguel SQUIBB, MD

## 2024-09-11 ENCOUNTER — Other Ambulatory Visit (HOSPITAL_COMMUNITY): Payer: Self-pay | Admitting: Specialist

## 2024-09-11 DIAGNOSIS — M25562 Pain in left knee: Secondary | ICD-10-CM

## 2024-09-13 ENCOUNTER — Other Ambulatory Visit: Payer: Self-pay

## 2024-09-20 ENCOUNTER — Encounter (HOSPITAL_COMMUNITY)
Admission: RE | Admit: 2024-09-20 | Discharge: 2024-09-20 | Disposition: A | Discharge status: Home / Self Care | Source: Ambulatory Visit | Attending: Specialist | Admitting: Specialist

## 2024-09-20 ENCOUNTER — Encounter (HOSPITAL_COMMUNITY): Payer: Self-pay

## 2024-09-20 DIAGNOSIS — M25562 Pain in left knee: Secondary | ICD-10-CM | POA: Diagnosis present

## 2024-09-20 MED ORDER — TECHNETIUM TC 99M MEDRONATE IV KIT
20.0000 | PACK | Freq: Once | INTRAVENOUS | Status: AC
  Administered 2024-09-20: 21.1 via INTRAVENOUS

## 2024-09-27 ENCOUNTER — Ambulatory Visit (INDEPENDENT_AMBULATORY_CARE_PROVIDER_SITE_OTHER): Admitting: Podiatry

## 2024-09-27 ENCOUNTER — Other Ambulatory Visit: Payer: Self-pay

## 2024-09-27 ENCOUNTER — Encounter: Payer: Self-pay | Admitting: Podiatry

## 2024-09-27 DIAGNOSIS — B351 Tinea unguium: Secondary | ICD-10-CM

## 2024-09-27 DIAGNOSIS — M79675 Pain in left toe(s): Secondary | ICD-10-CM | POA: Diagnosis not present

## 2024-09-27 DIAGNOSIS — M79674 Pain in right toe(s): Secondary | ICD-10-CM

## 2024-09-27 DIAGNOSIS — E1140 Type 2 diabetes mellitus with diabetic neuropathy, unspecified: Secondary | ICD-10-CM | POA: Diagnosis not present

## 2024-09-27 NOTE — Progress Notes (Signed)
 This patient returns to my office for at risk foot care.  This patient requires this care by a professional since this patient will be at risk due to having diabetic neuropathy.  This patient is unable to cut nails herself since the patient cannot reach her nails.These nails are painful walking and wearing shoes.  This patient presents for at risk foot care today.  General Appearance  Alert, conversant and in no acute stress.  Vascular  Dorsalis pedis and posterior tibial  pulses are palpable  bilaterally.  Capillary return is within normal limits  bilaterally. Temperature is within normal limits  bilaterally.  Neurologic  Senn-Weinstein monofilament wire test diminished  bilaterally. Muscle power within normal limits bilaterally.  Nails Thick disfigured discolored nails with subungual debris  from hallux to fifth toes bilaterally. No evidence of bacterial infection or drainage bilaterally.  Orthopedic  No limitations of motion  feet .  No crepitus or effusions noted.  No bony pathology or digital deformities noted.  Skin  normotropic skin with no porokeratosis noted bilaterally.  No signs of infections or ulcers noted.     Onychomycosis  Pain in right toes  Pain in left toes  Consent was obtained for treatment procedures.   Mechanical debridement of nails 1-5  bilaterally performed with a nail nipper.  Filed with dremel without incident.    Return office visit   9 weeks                  Told patient to return for periodic foot care and evaluation due to potential at risk complications.   Helane Gunther DPM

## 2024-09-29 ENCOUNTER — Other Ambulatory Visit: Payer: Self-pay

## 2024-10-02 ENCOUNTER — Encounter: Payer: Self-pay | Admitting: Radiology

## 2024-10-22 NOTE — Progress Notes (Unsigned)
 S:     No chief complaint on file.  63 y.o. female who presents for diabetes evaluation, education, and management. CAD sp MI in 2021 with 3 stents placed. She also has a hx of T2DM, HTN, GERD, HLD, peripheral neuropathy secondary to DM, chronic neck and back pain, arthritis, obesity, urinary incontinence.  Patient was referred and last seen by Primary Care Provider, Dr. Tanda, on 09/04/2024. Pharmacy has seen her a couple of times since May. Over the course of those visits, pharmacy started her on basal + bolus insulin  and adjusted those. Patient is also being seen by Endocrinology. She is due to see them on 11/08/2024.  Patient arrives in good spirits and presents without any assistance. Her A1c today is 6.5%. She endorses adherence to her medications. She admits that her sugar is still dropping at times but she is comfortable with her current regimen. Of note, she presents with a ~2-3 cm boil under her R breast. She has a hx of hidradenitis suppurativa and has presented with similar symptoms, managed by doxycycline  in the past. She does not currently see RA.   Family/Social History: Fhx: HTN, DM MI  Tobacco: never smoker  Alcohol: none reported   Current diabetes medications include: Lantus  34 units daily, Novolog  12 units TID, metformin  ER 1000 mg daily, Farxiga  10 mg, Rybelsus  3 mg  Insurance coverage: [employer], [place] Medicaid  Patient reports hypoglycemic events.  Patient denies nocturia (nighttime urination).  Patient reports neuropathy (nerve pain). Patient denies visual changes. Patient reports self foot exams.   Patient reported dietary habits:  -Tries to follow a proper CM diet -Eating lots of fruit   Patient-reported exercise habits: none due to chronic pain  O:  Libre3 CGM Download 10/13/24 % Time CGM is active: 78% Average Glucose: 134 mg/dL Glucose Management Indicator: 6.5% Glucose Variability: not provided in patient's LibreReport via phone app Time  in Goal:  - Time in range 70-180: 77% - Time above range: 16% - Time below range: 7%  Lab Results  Component Value Date   HGBA1C 6.5 10/23/2024   There were no vitals filed for this visit.  Lipid Panel     Component Value Date/Time   CHOL 115 06/26/2024 1359   TRIG 145 06/26/2024 1359   HDL 43 06/26/2024 1359   CHOLHDL 2.7 06/26/2024 1359   CHOLHDL 4.4 10/09/2019 1000   VLDL 26 10/09/2019 1000   LDLCALC 47 06/26/2024 1359   LDLCALC 150 (H) 12/21/2018 1706   LDLDIRECT 94.0 09/25/2015 0956    Clinical Atherosclerotic Cardiovascular Disease (ASCVD): No  The ASCVD Risk score (Arnett DK, et al., 2019) failed to calculate for the following reasons:   Risk score cannot be calculated because patient has a medical history suggesting prior/existing ASCVD   A/P: Diabetes longstanding, currently controlled with A1c of 6.5%. This A1c is down from 12.1% in April, 2025. Commended her for this! She is adherence to her medications as prescribed. AGP report looks steady and showing improvement since seeing Dr. Vianne. Patient has hypoglycemia events. Per AGP report, she has ~7% hypoglycemia over the last 4 weeks.  We will decrease basal insulin  from 34 units to 30 units today. Will also continue Rybelsus  titration. Patient is able to verbalize appropriate hypoglycemia management plan.  -Decreased dose of Lantus  from 34 units to 30 units daily in the morning.  -Continued rapid insulin  Novolog  (insulin  aspart) 12 units TID 15 minutes before meals. Do not give a dose if you are not  eating a meal.  -INCREASE Rybelsus  (semaglutide ) to 7 mg.  -Continued SGLT2-I Farxiga  (dapagliflozin ) 10 mg. Counseled on sick day rules. -Continued metformin  1000 mg with dinner.  -Patient educated on purpose, proper use, and potential adverse effects of insulin  and Rybelsus .  -Extensively discussed pathophysiology of diabetes, recommended lifestyle interventions, dietary effects on blood sugar control.  -Counseled on  s/sx of and management of hypoglycemia.  -Next A1c anticipated 12/2024.  -Message sent to PCP for presentation symptoms. She agreed to send in doxycycline . I have also submitted a referral to Rheumatology for HS.  Written patient instructions provided. Patient verbalized understanding of treatment plan.  Total time in face to face counseling 30 minutes.    Follow-up:  Pharmacist prn PCP clinic visit with Dr. Tanda 11/06/24  Jenkins Graces, PharmD PGY1 Pharmacy Resident [phone]  Herlene Fleeta Morris, PharmD, BCACP, CPP Clinical Pharmacist [facility] [phone]

## 2024-10-23 ENCOUNTER — Other Ambulatory Visit: Payer: Self-pay | Admitting: Family Medicine

## 2024-10-23 ENCOUNTER — Encounter: Payer: Self-pay | Admitting: Pharmacist

## 2024-10-23 ENCOUNTER — Other Ambulatory Visit: Payer: Self-pay

## 2024-10-23 ENCOUNTER — Ambulatory Visit: Attending: Family Medicine | Admitting: Pharmacist

## 2024-10-23 DIAGNOSIS — E1165 Type 2 diabetes mellitus with hyperglycemia: Secondary | ICD-10-CM

## 2024-10-23 DIAGNOSIS — Z7984 Long term (current) use of oral hypoglycemic drugs: Secondary | ICD-10-CM

## 2024-10-23 DIAGNOSIS — L732 Hidradenitis suppurativa: Secondary | ICD-10-CM

## 2024-10-23 DIAGNOSIS — Z794 Long term (current) use of insulin: Secondary | ICD-10-CM | POA: Diagnosis not present

## 2024-10-23 LAB — POCT GLYCOSYLATED HEMOGLOBIN (HGB A1C): HbA1c, POC (controlled diabetic range): 6.5 % (ref 0.0–7.0)

## 2024-10-23 MED ORDER — DOXYCYCLINE HYCLATE 100 MG PO TABS: 100.0000 mg | ORAL_TABLET | Freq: Two times a day (BID) | ORAL | 0 refills | Status: DC

## 2024-10-23 MED ORDER — LANTUS SOLOSTAR 100 UNIT/ML ~~LOC~~ SOPN
34.0000 [IU] | PEN_INJECTOR | Freq: Every day | SUBCUTANEOUS | 1 refills | Status: DC
  Filled 2024-10-23: qty 30, 88d supply, fill #0

## 2024-10-23 MED ORDER — FREESTYLE LIBRE 3 PLUS SENSOR MISC
6 refills | Status: DC
  Filled 2024-10-23: qty 2, 30d supply, fill #0

## 2024-10-23 MED ORDER — RYBELSUS 7 MG PO TABS
7.0000 mg | ORAL_TABLET | Freq: Every day | ORAL | 2 refills | Status: DC
  Filled 2024-10-23 – 2024-10-25 (×3): qty 30, 30d supply, fill #0
  Filled 2024-12-27: qty 30, 30d supply, fill #1

## 2024-10-23 MED ORDER — LANTUS SOLOSTAR 100 UNIT/ML ~~LOC~~ SOPN
30.0000 [IU] | PEN_INJECTOR | Freq: Every day | SUBCUTANEOUS | 1 refills | Status: DC
  Filled 2024-10-23: qty 27, 90d supply, fill #0

## 2024-10-23 MED ORDER — LANTUS SOLOSTAR 100 UNIT/ML ~~LOC~~ SOPN: 30.0000 [IU] | PEN_INJECTOR | Freq: Every day | SUBCUTANEOUS | 1 refills | Status: DC

## 2024-10-23 NOTE — Addendum Note (Signed)
 Addended by: FLEETA MORRIS, GARNETTE L on: 10/23/2024 02:46 PM   Modules accepted: Orders

## 2024-10-24 ENCOUNTER — Other Ambulatory Visit: Payer: Self-pay

## 2024-10-25 ENCOUNTER — Other Ambulatory Visit: Payer: Self-pay

## 2024-11-03 ENCOUNTER — Other Ambulatory Visit: Payer: Self-pay

## 2024-11-06 ENCOUNTER — Ambulatory Visit: Admitting: Family Medicine

## 2024-11-06 VITALS — BP 123/87 | HR 76 | Ht 63.0 in | Wt 180.2 lb

## 2024-11-06 DIAGNOSIS — E1169 Type 2 diabetes mellitus with other specified complication: Secondary | ICD-10-CM

## 2024-11-06 DIAGNOSIS — I10 Essential (primary) hypertension: Secondary | ICD-10-CM

## 2024-11-06 DIAGNOSIS — E1165 Type 2 diabetes mellitus with hyperglycemia: Secondary | ICD-10-CM

## 2024-11-06 NOTE — Progress Notes (Unsigned)
 Established Patient Office Visit  Subjective    Patient ID: [name], female    DOB: 1961-07-14  Age: 63 y.o. MRN: [id number]  CC:  Chief Complaint  Patient presents with   Medical Management of Chronic Issues    Pt reports her breast issue is drying up     HPI [name] presents ***  Outpatient Encounter Medications as of 11/06/2024  Medication Sig   aspirin  81 MG chewable tablet Chew 1 tablet (81 mg total) by mouth daily.   atorvastatin  (LIPITOR) 40 MG tablet Take 1 tablet (40 mg total) by mouth daily.   Blood Glucose Monitoring Suppl (ACCU-CHEK AVIVA PLUS) w/Device KIT Use to check blood sugar 2 times a day   Continuous Glucose Receiver (FREESTYLE LIBRE 3 READER) DEVI Use to check blood sugar continuously throughout. Change sensor every 15 days.   Continuous Glucose Sensor (FREESTYLE LIBRE 3 PLUS SENSOR) MISC Use to check blood sugar continuously throughout. Change sensor every 15 days.   cyanocobalamin  (VITAMIN B12) 1000 MCG tablet Take 1 tablet (1,000 mcg total) by mouth daily.   diclofenac  Sodium (VOLTAREN ) 1 % GEL Apply 4 g topically 4 (four) times daily.   doxycycline  (VIBRA -TABS) 100 MG tablet Take 1 tablet (100 mg total) by mouth 2 (two) times daily.   ezetimibe  (ZETIA ) 10 MG tablet Take 1 tablet (10 mg total) by mouth daily.   FARXIGA  10 MG TABS tablet Take 1 tablet (10 mg total) by mouth daily.   gabapentin  (NEURONTIN ) 600 MG tablet Take 1 tablet (600 mg total) by mouth 3 (three) times daily.   glucose blood (BAYER CONTOUR TEST) test strip TEST BLOOD SUGARS 3 TIMES DAILY   glucose blood test strip Use to check blood sugar 2 times a day   HYDROcodone -acetaminophen  (NORCO/VICODIN) 5-325 MG tablet Take 1 tablet by mouth every 6 (six) hours as needed for moderate pain (pain score 4-6).   HYDROmorphone  (DILAUDID ) 2 MG tablet Take 2 mg by mouth as needed for severe pain.   hydrOXYzine  (ATARAX ) 25 MG tablet TAKE 1 TABLET BY MOUTH THREE TIMES DAILY AS NEEDED FOR   ITCHING   ibuprofen  (ADVIL ) 800 MG tablet Take 800 mg by mouth 3 (three) times daily.   insulin  aspart (NOVOLOG  FLEXPEN) 100 UNIT/ML FlexPen Inject 12 Units into the skin 3 (three) times daily with meals.   insulin  glargine (LANTUS  SOLOSTAR) 100 UNIT/ML Solostar Pen Inject 30 Units into the skin daily.   Insulin  Pen Needle (PEN NEEDLES) 32G X 4 MM MISC Use four times a day with insulin .   Lancet Devices (EASY TOUCH LANCING DEVICE) MISC Use to check diabetes 4 times daily  E 11.65   losartan  (COZAAR ) 25 MG tablet Take 1 tablet (25 mg total) by mouth every evening.   metFORMIN  (GLUCOPHAGE -XR) 500 MG 24 hr tablet Take 2 tablets (1,000 mg total) by mouth daily with supper.   metoCLOPramide  (REGLAN ) 5 MG tablet Take 1 tablet (5 mg total) by mouth 3 (three) times daily before meals.   metoprolol  succinate (TOPROL -XL) 50 MG 24 hr tablet TAKE 1 TABLET BY MOUTH ONCE DAILY. TAKE  WITH  OR  IMMEDIATELY  FOLLOWING  A  MEAL   naloxone  (NARCAN ) nasal spray 4 mg/0.1 mL Place 1 spray into the nose as needed (or).   naproxen  (NAPROSYN ) 500 MG tablet Take 1 tablet (500 mg total) by mouth 2 (two) times daily with a meal.   ondansetron  (ZOFRAN -ODT) 8 MG disintegrating tablet Take 1 tablet (8 mg total)  by mouth every 8 (eight) hours as needed for nausea or vomiting.   pantoprazole  (PROTONIX ) 40 MG tablet Take 1 tablet (40 mg total) by mouth 2 (two) times daily.   Semaglutide  (RYBELSUS ) 7 MG TABS Take 1 tablet (7 mg total) by mouth daily.   tiZANidine  (ZANAFLEX ) 4 MG tablet TAKE 1 TABLET BY MOUTH EVERY 6 HOURS AS NEEDED FOR MUSCLE SPASM   traMADol  (ULTRAM ) 50 MG tablet Take 50 mg by mouth every 6 (six) hours as needed.   triamcinolone  cream (KENALOG ) 0.1 % Apply 1 Application topically daily.   Ascorbic Acid (VITAMIN C PO) Take 1 tablet by mouth daily. (Patient not taking: Reported on 11/06/2024)   No facility-administered encounter medications on file as of 11/06/2024.    Past Medical History:  Diagnosis Date    Arthritis    Campylobacter enteritis 03/2011   [facility] admission   Carpal tunnel syndrome, bilateral    Diabetes mellitus    Gastritis with bleeding due to alcohol 2010   [facility] admission   GERD (gastroesophageal reflux disease)    HIDRADENITIS SUPPURATIVA 04/11/2007   Qualifier: Diagnosis of   By: Celinda MD, Erle Clunes        Hyperlipidemia    Hypertension    no meds   Menopause    Myocardial infarction [facility])    Neck pain    Wears glasses     Past Surgical History:  Procedure Laterality Date   CARPAL TUNNEL RELEASE Left 05/14/2014   Procedure: LEFT ULNAR NEUROPLASTY AT ELBOW AND ENDOSCOPIC CARPAL TUNNEL RELEASE;  Surgeon: Alm DELENA Hummer, MD;  Location: [facility];  Service: Orthopedics;  Laterality: Left;   CORONARY STENT INTERVENTION N/A 10/09/2019   Procedure: CORONARY STENT INTERVENTION;  Surgeon: Ladona Heinz, MD;  Location: [facility];  Service: Cardiovascular;  Laterality: N/A;   CYST EXCISION  1995   under arms   endometrial biopsy  2004   benign   FOOT OSTEOTOMY  2010   right-spurs   LEFT HEART CATH AND CORONARY ANGIOGRAPHY N/A 10/09/2019   Procedure: LEFT HEART CATH AND CORONARY ANGIOGRAPHY;  Surgeon: Ladona Heinz, MD;  Location: [facility];  Service: Cardiovascular;  Laterality: N/A;   TONSILLECTOMY     TOTAL KNEE ARTHROPLASTY Left 02/26/2023   Procedure: LEFT TOTAL KNEE ARTHROPLASTY;  Surgeon: Vernetta Lonni GRADE, MD;  Location: [facility];  Service: Orthopedics;  Laterality: Left;  Needs RNFA   ULNAR NERVE TRANSPOSITION Left 05/14/2014   Procedure: ULNAR NERVE DECOMPRESSION/TRANSPOSITION;  Surgeon: Alm DELENA Hummer, MD;  Location:  SURGERY CENTER;  Service: Orthopedics;  Laterality: Left;    Family History  Problem Relation Age of Onset   Hypertension Mother    Diabetes Mother    Heart attack Mother    Stroke Father    Diabetes Father    Heart attack Sister    Diabetes Maternal Grandmother    Hypertension Maternal  Grandmother    Colon cancer Neg Hx    Esophageal cancer Neg Hx    Rectal cancer Neg Hx    Stomach cancer Neg Hx     Social History   Socioeconomic History   Marital status: Single    Spouse name: Not on file   Number of children: 2   Years of education: 12   Highest education level: 12th grade  Occupational History   Not on file  Tobacco Use   Smoking status: Never    Passive exposure: Past   Smokeless tobacco: Never  Vaping Use   Vaping status: Never Used  Substance and Sexual Activity   Alcohol use: Not Currently   Drug use: Yes    Types: Marijuana    Comment: sometimes; last use on Tuesday, 07/20/23   Sexual activity: Yes    Birth control/protection: None    Comment: same partner x 10 years  Other Topics Concern   Not on file  Social History Narrative   She works as a arboriculturist.   Right-handed.   One cup caffeine  per day.   She lives at home with daughter.   Social Drivers of Health   Financial Resource Strain: Medium Risk (10/20/2024)   Overall Financial Resource Strain (CARDIA)    Difficulty of Paying Living Expenses: Somewhat hard  Food Insecurity: Food Insecurity Present (10/20/2024)   Hunger Vital Sign    Worried About Running Out of Food in the Last Year: Sometimes true    Ran Out of Food in the Last Year: Never true  Transportation Needs: No Transportation Needs (10/20/2024)   PRAPARE - Administrator, Civil Service (Medical): No    Lack of Transportation (Non-Medical): No  Physical Activity: Inactive (10/20/2024)   Exercise Vital Sign    Days of Exercise per Week: 0 days    Minutes of Exercise per Session: Not on file  Stress: Stress Concern Present (10/20/2024)   [employer] of Occupational Health - Occupational Stress Questionnaire    Feeling of Stress: To some extent  Social Connections: Moderately Isolated (10/20/2024)   Social Connection and Isolation Panel    Frequency of Communication with Friends and Family: More than  three times a week    Frequency of Social Gatherings with Friends and Family: Twice a week    Attends Religious Services: Never    Database Administrator or Organizations: Yes    Attends Banker Meetings: Never    Marital Status: Never married  Intimate Partner Violence: Not At Risk (05/31/2023)   Humiliation, Afraid, Rape, and Kick questionnaire    Fear of Current or Ex-Partner: No    Emotionally Abused: No    Physically Abused: No    Sexually Abused: No    ROS      Objective    BP 123/87   Pulse 76   Ht 5' 3 (1.6 m)   Wt 180 lb 3.2 oz (81.7 kg)   SpO2 98%   BMI 31.92 kg/m   Physical Exam  {Labs (Optional):23779}    Assessment & Plan:   There are no diagnoses linked to this encounter.   No follow-ups on file.   Tanda Raguel SQUIBB, MD

## 2024-11-08 ENCOUNTER — Encounter: Payer: Self-pay | Admitting: Internal Medicine

## 2024-11-08 ENCOUNTER — Other Ambulatory Visit: Payer: Self-pay

## 2024-11-08 ENCOUNTER — Encounter: Payer: Self-pay | Admitting: Family Medicine

## 2024-11-08 ENCOUNTER — Ambulatory Visit (INDEPENDENT_AMBULATORY_CARE_PROVIDER_SITE_OTHER): Admitting: Internal Medicine

## 2024-11-08 VITALS — BP 106/58 | HR 88 | Ht 63.0 in | Wt 179.2 lb

## 2024-11-08 DIAGNOSIS — Z794 Long term (current) use of insulin: Secondary | ICD-10-CM | POA: Diagnosis not present

## 2024-11-08 DIAGNOSIS — E66811 Obesity, class 1: Secondary | ICD-10-CM

## 2024-11-08 DIAGNOSIS — E11319 Type 2 diabetes mellitus with unspecified diabetic retinopathy without macular edema: Secondary | ICD-10-CM | POA: Diagnosis not present

## 2024-11-08 DIAGNOSIS — E785 Hyperlipidemia, unspecified: Secondary | ICD-10-CM

## 2024-11-08 DIAGNOSIS — E1169 Type 2 diabetes mellitus with other specified complication: Secondary | ICD-10-CM

## 2024-11-08 MED ORDER — LANTUS SOLOSTAR 100 UNIT/ML ~~LOC~~ SOPN: 26.0000 [IU] | PEN_INJECTOR | Freq: Every day | SUBCUTANEOUS | Status: AC

## 2024-11-08 MED ORDER — NOVOLOG FLEXPEN 100 UNIT/ML ~~LOC~~ SOPN: 6.0000 [IU] | PEN_INJECTOR | Freq: Three times a day (TID) | SUBCUTANEOUS | Status: DC

## 2024-11-08 NOTE — Progress Notes (Signed)
 Patient ID: [name], female   DOB: 1961/06/30, 63 y.o.   MRN: [id number]  HPI: [name] is a 63 y.o.-year-old female, returning for f/u for DM2, dx ~2000, insulin -dependent since 2010, uncontrolled, with complications (PN, + DR). Last visit 3 months ago.  Interim history: No increased urination, blurry vision, nausea. She had diarrhea/stool incontinence but this improved after switching to metformin  ER. She again stopped sweet drinks before last visit.  Reviewed HbA1c levels: Lab Results  Component Value Date   HGBA1C 6.5 10/23/2024   HGBA1C 8.5 (A) 06/26/2024   HGBA1C 12.1 (A) 03/29/2024   HGBA1C 7.7 (A) 11/22/2023   HGBA1C 14.4 (A) 08/23/2023   HGBA1C 8.6 (H) 06/01/2023   HGBA1C 6.2 (H) 02/15/2023   HGBA1C 6.2 (A) 10/26/2022   HGBA1C 7.8 (H) 07/04/2022   HGBA1C 6.5 (A) 04/10/2022   HGBA1C 7.7 (A) 11/10/2021   HGBA1C 7.3 (A) 01/24/2021   HGBA1C 7.1 (A) 09/13/2020   HGBA1C 6.4 (A) 05/13/2020   HGBA1C 7.6 (A) 08/31/2019   HGBA1C 7.8 (A) 11/25/2018   HGBA1C 6.8 (A) 07/25/2018   HGBA1C 6.4% 03/03/2018   HGBA1C 6.5 11/01/2017   HGBA1C 6.2 07/23/2017   HGBA1C 6.8 04/09/2017   HGBA1C 6.4 01/08/2017   HGBA1C 6.4 10/08/2016   HGBA1C 10.4 07/07/2016   HGBA1C 10.4 04/07/2016   HGBA1C 9.3 01/14/2016   HGBA1C 10.9 (H) 09/25/2015   HGBA1C 11.2 09/19/2015   HGBA1C 7.7 06/21/2015   HGBA1C 7.9 (H) 03/22/2015   Pt was on:  Insulin  Before breakfast Before lunch Before dinner  R 10 10 (6 units if active after the meal) 20-25  NPH 15 - 20  Also: - Metformin  1000 mg twice a day  Then on: - Metformin  2000 mg with dinner >> restarted 1000 mg with dinner - Farxiga  10 mg before b'fast - >> off  - Tresiba  U200 32 units daily  - FiAsp : 8-11 units before b'fast 8-11 units before lunch - missing 13-16 units before dinner - missing  I have recommended the following regimen: - Metformin  1000 mg with dinner >> metformin  ER 1000 mg 2x a day - Farxiga  10 mg before b'fast -  Tresiba  U200 32 >> 28 units daily >> Lantus  32 >> 36 >> 34 units - FiAsp  >> Novolog  12 units before each meal >>  Pt is checking her sugars >4x a day with her CGM:  Previously:  Previously:  Prev.:  Lowest sugar was 29 (?) in 11/2020 >> .SABRA. 40s >> ... 50s >> 54; she has hypoglycemia awareness in the 53s. Highest sugar was 300s >> 399 >> 221.  -No CKD, last BUN/creatinine:  Lab Results  Component Value Date   BUN 12 06/26/2024   CREATININE 0.68 06/26/2024   ACR was normal: Lab Results  Component Value Date   MICRALBCREAT NOTE 08/09/2024   MICRALBCREAT NOTE 10/01/2023   MICRALBCREAT <4 11/12/2022   MICRALBCREAT 3 01/24/2021  Not on ACE inhibitor/ARB  -+ HL; last set of lipids: Lab Results  Component Value Date   CHOL 115 06/26/2024   HDL 43 06/26/2024   LDLCALC 47 06/26/2024   LDLDIRECT 94.0 09/25/2015   TRIG 145 06/26/2024   CHOLHDL 2.7 06/26/2024  On Lipitor 40, prev. Also on Zetia  10.  - last eye exam was in 11/2022 reportedly: + DR OU, reportedly glaucoma. She sees Dr. Mavis.    - + numbness and tingling in her feet.  She is on Neurontin .  She sees podiatry.  Last foot exam: 03/27/2024 (Dr.  Loreda).  She also has a history of HTN, episodic alcohol abuse, GERD, low back pain, OA.  She went to the emergency room 06/28/2021 for nausea, vomiting, and abdominal pain.  Her lipase was high.  Ozempic  was stopped. She was also in the ED post a fall down the stairs 08/2021. L forearm was in a splint >> saw ortho She had severe sciatic back pain.  She had nausea/vomiting/abdominal pain (06/2022) - was admitted.  Lipase level was normal.  It was considered that the symptoms were related to smoking marijuana and also possibly her diabetes.   She had an almost identical admission in 05/2023.  Ozempic  was stopped, however, she was again smoking marijuana at that time.  ROS: + see HPI  I reviewed pt's medications, allergies, PMH, social hx, family hx, and changes were  documented in the history of present illness. Otherwise, unchanged from my initial visit note.  Past Medical History:  Diagnosis Date   Arthritis    Campylobacter enteritis 03/2011   [facility] admission   Carpal tunnel syndrome, bilateral    Diabetes mellitus    Gastritis with bleeding due to alcohol 2010   [facility] admission   GERD (gastroesophageal reflux disease)    HIDRADENITIS SUPPURATIVA 04/11/2007   Qualifier: Diagnosis of   By: Celinda MD, Erle Clunes        Hyperlipidemia    Hypertension    no meds   Menopause    Myocardial infarction [facility])    Neck pain    Wears glasses    Past Surgical History:  Procedure Laterality Date   CARPAL TUNNEL RELEASE Left 05/14/2014   Procedure: LEFT ULNAR NEUROPLASTY AT ELBOW AND ENDOSCOPIC CARPAL TUNNEL RELEASE;  Surgeon: Alm DELENA Hummer, MD;  Location: [facility];  Service: Orthopedics;  Laterality: Left;   CORONARY STENT INTERVENTION N/A 10/09/2019   Procedure: CORONARY STENT INTERVENTION;  Surgeon: Ladona Heinz, MD;  Location: [facility];  Service: Cardiovascular;  Laterality: N/A;   CYST EXCISION  1995   under arms   endometrial biopsy  2004   benign   FOOT OSTEOTOMY  2010   right-spurs   LEFT HEART CATH AND CORONARY ANGIOGRAPHY N/A 10/09/2019   Procedure: LEFT HEART CATH AND CORONARY ANGIOGRAPHY;  Surgeon: Ladona Heinz, MD;  Location: [facility];  Service: Cardiovascular;  Laterality: N/A;   TONSILLECTOMY     TOTAL KNEE ARTHROPLASTY Left 02/26/2023   Procedure: LEFT TOTAL KNEE ARTHROPLASTY;  Surgeon: Vernetta Lonni GRADE, MD;  Location: [facility];  Service: Orthopedics;  Laterality: Left;  Needs RNFA   ULNAR NERVE TRANSPOSITION Left 05/14/2014   Procedure: ULNAR NERVE DECOMPRESSION/TRANSPOSITION;  Surgeon: Alm DELENA Hummer, MD;  Location:  SURGERY CENTER;  Service: Orthopedics;  Laterality: Left;   Social History   Socioeconomic History   Marital status: Single    Spouse name: Not on file   Number of  children: 2   Years of education: 66   Highest education level: 12th grade  Occupational History   Not on file  Tobacco Use   Smoking status: Never    Passive exposure: Past   Smokeless tobacco: Never  Vaping Use   Vaping status: Never Used  Substance and Sexual Activity   Alcohol use: Not Currently   Drug use: Yes    Types: Marijuana    Comment: sometimes; last use on Tuesday, 07/20/23   Sexual activity: Yes    Birth control/protection: None    Comment: same partner x 10 years  Other Topics Concern   Not on file  Social History Narrative   She works as a arboriculturist.   Right-handed.   One cup caffeine  per day.   She lives at home with daughter.   Social Drivers of Health   Financial Resource Strain: Medium Risk (10/20/2024)   Overall Financial Resource Strain (CARDIA)    Difficulty of Paying Living Expenses: Somewhat hard  Food Insecurity: Food Insecurity Present (10/20/2024)   Hunger Vital Sign    Worried About Running Out of Food in the Last Year: Sometimes true    Ran Out of Food in the Last Year: Never true  Transportation Needs: No Transportation Needs (10/20/2024)   PRAPARE - Administrator, Civil Service (Medical): No    Lack of Transportation (Non-Medical): No  Physical Activity: Inactive (10/20/2024)   Exercise Vital Sign    Days of Exercise per Week: 0 days    Minutes of Exercise per Session: Not on file  Stress: Stress Concern Present (10/20/2024)   [employer] of Occupational Health - Occupational Stress Questionnaire    Feeling of Stress: To some extent  Social Connections: Moderately Isolated (10/20/2024)   Social Connection and Isolation Panel    Frequency of Communication with Friends and Family: More than three times a week    Frequency of Social Gatherings with Friends and Family: Twice a week    Attends Religious Services: Never    Database Administrator or Organizations: Yes    Attends Banker Meetings: Never     Marital Status: Never married  Intimate Partner Violence: Not At Risk (05/31/2023)   Humiliation, Afraid, Rape, and Kick questionnaire    Fear of Current or Ex-Partner: No    Emotionally Abused: No    Physically Abused: No    Sexually Abused: No   Current Outpatient Medications on File Prior to Visit  Medication Sig Dispense Refill   Ascorbic Acid (VITAMIN C PO) Take 1 tablet by mouth daily.     aspirin  81 MG chewable tablet Chew 1 tablet (81 mg total) by mouth daily. 30 tablet 3   atorvastatin  (LIPITOR) 40 MG tablet Take 1 tablet (40 mg total) by mouth daily. 90 tablet 3   Blood Glucose Monitoring Suppl (ACCU-CHEK AVIVA PLUS) w/Device KIT Use to check blood sugar 2 times a day 1 kit 0   Continuous Glucose Receiver (FREESTYLE LIBRE 3 READER) DEVI Use to check blood sugar continuously throughout. Change sensor every 15 days. 1 each 0   Continuous Glucose Sensor (FREESTYLE LIBRE 3 PLUS SENSOR) MISC Use to check blood sugar continuously throughout. Change sensor every 15 days. 2 each 6   cyanocobalamin  (VITAMIN B12) 1000 MCG tablet Take 1 tablet (1,000 mcg total) by mouth daily. 90 tablet 0   diclofenac  Sodium (VOLTAREN ) 1 % GEL Apply 4 g topically 4 (four) times daily. 350 g 1   ezetimibe  (ZETIA ) 10 MG tablet Take 1 tablet (10 mg total) by mouth daily. 90 tablet 3   FARXIGA  10 MG TABS tablet Take 1 tablet (10 mg total) by mouth daily. 90 tablet 0   gabapentin  (NEURONTIN ) 600 MG tablet Take 1 tablet (600 mg total) by mouth 3 (three) times daily. 90 tablet 5   glucose blood (BAYER CONTOUR TEST) test strip TEST BLOOD SUGARS 3 TIMES DAILY 200 each 11   glucose blood test strip Use to check blood sugar 2 times a day 100 each 12   HYDROcodone -acetaminophen  (NORCO/VICODIN) 5-325 MG tablet Take 1 tablet  by mouth every 6 (six) hours as needed for moderate pain (pain score 4-6). 30 tablet 0   HYDROmorphone  (DILAUDID ) 2 MG tablet Take 2 mg by mouth as needed for severe pain.     hydrOXYzine  (ATARAX ) 25 MG  tablet TAKE 1 TABLET BY MOUTH THREE TIMES DAILY AS NEEDED FOR  ITCHING 90 tablet 0   ibuprofen  (ADVIL ) 800 MG tablet Take 800 mg by mouth 3 (three) times daily.     insulin  aspart (NOVOLOG  FLEXPEN) 100 UNIT/ML FlexPen Inject 12 Units into the skin 3 (three) times daily with meals. 30 mL 3   insulin  glargine (LANTUS  SOLOSTAR) 100 UNIT/ML Solostar Pen Inject 30 Units into the skin daily. 30 mL 1   Insulin  Pen Needle (PEN NEEDLES) 32G X 4 MM MISC Use four times a day with insulin . 400 each 2   Lancet Devices (EASY TOUCH LANCING DEVICE) MISC Use to check diabetes 4 times daily  E 11.65 1 each 11   losartan  (COZAAR ) 25 MG tablet Take 1 tablet (25 mg total) by mouth every evening. 90 tablet 3   metFORMIN  (GLUCOPHAGE -XR) 500 MG 24 hr tablet Take 2 tablets (1,000 mg total) by mouth daily with supper. 180 tablet 3   metoCLOPramide  (REGLAN ) 5 MG tablet Take 1 tablet (5 mg total) by mouth 3 (three) times daily before meals. 90 tablet 0   metoprolol  succinate (TOPROL -XL) 50 MG 24 hr tablet TAKE 1 TABLET BY MOUTH ONCE DAILY. TAKE  WITH  OR  IMMEDIATELY  FOLLOWING  A  MEAL 90 tablet 0   naloxone  (NARCAN ) nasal spray 4 mg/0.1 mL Place 1 spray into the nose as needed (or).     naproxen  (NAPROSYN ) 500 MG tablet Take 1 tablet (500 mg total) by mouth 2 (two) times daily with a meal. 60 tablet 2   ondansetron  (ZOFRAN -ODT) 8 MG disintegrating tablet Take 1 tablet (8 mg total) by mouth every 8 (eight) hours as needed for nausea or vomiting. 10 tablet 0   pantoprazole  (PROTONIX ) 40 MG tablet Take 1 tablet (40 mg total) by mouth 2 (two) times daily. 180 tablet 0   Semaglutide  (RYBELSUS ) 7 MG TABS Take 1 tablet (7 mg total) by mouth daily. 30 tablet 2   tiZANidine  (ZANAFLEX ) 4 MG tablet TAKE 1 TABLET BY MOUTH EVERY 6 HOURS AS NEEDED FOR MUSCLE SPASM 90 tablet 0   traMADol  (ULTRAM ) 50 MG tablet Take 50 mg by mouth every 6 (six) hours as needed.     triamcinolone  cream (KENALOG ) 0.1 % Apply 1 Application topically daily.      No current facility-administered medications on file prior to visit.   Allergies  Allergen Reactions   Oxycodone -Acetaminophen  Other (See Comments)    Pt denies having reaction to this   Penicillins Rash   Family History  Problem Relation Age of Onset   Hypertension Mother    Diabetes Mother    Heart attack Mother    Stroke Father    Diabetes Father    Heart attack Sister    Diabetes Maternal Grandmother    Hypertension Maternal Grandmother    Colon cancer Neg Hx    Esophageal cancer Neg Hx    Rectal cancer Neg Hx    Stomach cancer Neg Hx    PE: BP (!) 106/58   Pulse 88   Ht 5' 3 (1.6 m)   Wt 179 lb 3.2 oz (81.3 kg)   SpO2 95%   BMI 31.74 kg/m . Wt Readings from Last 30 Encounters:  11/08/24 179 lb 3.2 oz (81.3 kg)  11/06/24 180 lb 3.2 oz (81.7 kg)  09/04/24 184 lb 9.6 oz (83.7 kg)  08/28/24 186 lb (84.4 kg)  08/09/24 186 lb 3.2 oz (84.5 kg)  07/21/24 187 lb (84.8 kg)  07/19/24 188 lb 4 oz (85.4 kg)  07/17/24 186 lb 6.4 oz (84.6 kg)  07/04/24 181 lb 9.6 oz (82.4 kg)  05/23/24 182 lb (82.6 kg)  04/11/24 173 lb 3.2 oz (78.6 kg)  03/29/24 168 lb (76.2 kg)  01/14/24 170 lb 3.2 oz (77.2 kg)  01/13/24 175 lb (79.4 kg)  01/04/24 168 lb 3.2 oz (76.3 kg)  12/31/23 172 lb 3.2 oz (78.1 kg)  12/02/23 169 lb 12.8 oz (77 kg)  11/26/23 167 lb (75.8 kg)  11/22/23 174 lb (78.9 kg)  10/26/23 162 lb 9.6 oz (73.8 kg)  10/01/23 162 lb 9.6 oz (73.8 kg)  08/31/23 153 lb 9.6 oz (69.7 kg)  08/23/23 153 lb (69.4 kg)  07/22/23 153 lb (69.4 kg)  06/22/23 153 lb 4 oz (69.5 kg)  06/11/23 150 lb 12.8 oz (68.4 kg)  06/02/23 154 lb 12.2 oz (70.2 kg)  05/25/23 158 lb (71.7 kg)  04/20/23 155 lb (70.3 kg)  02/26/23 174 lb 13.2 oz (79.3 kg)   Constitutional: overweight, walks with a cane Eyes: no exophthalmos ENT: no masses palpated in neck, no cervical lymphadenopathy Cardiovascular: RRR, No MRG Respiratory: CTA B Musculoskeletal: no deformities Skin: no rashes Neurological: no  tremor with outstretched hands  ASSESSMENT: 1. DM2, insulin -dependent, uncontrolled, with complications - peripheral neuropathy - DR   2.  Obesity class I  3. HL  PLAN:  1. Patient with longstanding, uncontrolled, type 2 diabetes, on metformin , SGLT2 inhibitor and basal/bolus insulin  regimen, with worsening blood sugar in the past, to 14.4% after she had to come off Ozempic  due to N/V/AP (with normal lipase).  Afterwards, sugars started to improve to an HbA1c of 7.7%, but at last visit, after restarting drinking sweet drinks, her HbA1c was high again, at 12.1%.   We again discussed at that time about the absolute need to stop sweet drinks and after doing so, sugars improved, with an HbA1c of 6.5% last month.  She is not drinking water with artificial sweetener. CGM interpretation: -At today's visit, we reviewed her CGM downloads: It appears that 93%  of values are in target range (goal >70%), while 0% are higher than 180 (goal <25%), and 7% are lower than 70 (goal <4%).  The calculated average blood sugar is 112.  The projected HbA1c for the next 3 months (GMI) is 6.0%. -Reviewing the CGM trends, sugars are well-controlled, mostly fluctuating within the target range but she does have many low blood sugars mostly midday but also during the night.  Will go ahead and decrease both her insulin  doses.  Will continue the same doses of metformin  and Farxiga  for now.  We discussed about still taking the NovoLog .  If the sugars are at goal before meals, which she is not doing now, but this is conducive to high blood sugars (for example this morning her blood sugar was in the 80s, she did not take NovoLog , and during the appointment she had a spike in the 220s). - I advised her to: Patient Instructions  Please continue: - Metformin  ER 1000 mg 2x a day - Farxiga  10 mg before b'fast  Reduce: - Lantus  26 units daily - Novolog  6-10 units 3x day  Take NovoLog  15 min before meals! If you need  to take it  after a meal, may need a lower dose.    Please return in 3 months.  - advised to check sugars at different times of the day - 4x a day, rotating check times - advised for yearly eye exams >> she is UTD - return to clinic in 3 months  2.  Obesity class I -We tried Ozempic  in the past but she had to come off due to nausea/vomiting/abdominal pain and a high lipase in 05/2021.  Afterwards, she really wanted to restart this again.  We discussed about possible side effects and possible alarm symptoms and we started her back on a low-dose of Ozempic .  However, she was admitted in 05/2023 with intractable nausea/vomiting/abdominal pain (at that time, she also reported daily marijuana use - was advised to stop).  She came off Ozempic  at that time. - Before last year, she lost approximately 40 pounds, possibly due to glucotoxicity, then gained 33 pounds back.  She gained 4 more pounds before last visit. - she lost 7 pounds since last visit.  3. HL - Latest lipid panel was at goal at last check: Lab Results  Component Value Date   CHOL 115 06/26/2024   HDL 43 06/26/2024   LDLCALC 47 06/26/2024   LDLDIRECT 94.0 09/25/2015   TRIG 145 06/26/2024   CHOLHDL 2.7 06/26/2024  - She continues on Lipitor 40 mg daily and Zetia  10 mg daily without side effects  Lela Fendt, MD PhD [facility]

## 2024-11-08 NOTE — Patient Instructions (Addendum)
 Please continue: - Metformin  ER 1000 mg 2x a day - Farxiga  10 mg before b'fast  Reduce: - Lantus  26 units daily - Novolog  6-10 units 3x day  Take NovoLog  15 min before meals! If you need to take it after a meal, may need a lower dose.    Please return in 3 months.

## 2024-11-21 ENCOUNTER — Ambulatory Visit: Admitting: Gastroenterology

## 2024-11-21 ENCOUNTER — Other Ambulatory Visit: Payer: Self-pay | Admitting: Orthopedic Surgery

## 2024-11-21 DIAGNOSIS — T84033D Mechanical loosening of internal left knee prosthetic joint, subsequent encounter: Secondary | ICD-10-CM

## 2024-11-21 NOTE — Progress Notes (Deleted)
 "  HPI :  63 y.o. y/o female referred for consultation & management  by Tanda Bleacher, MD.  Here for follow-up of nausea and Vomiting.   She last saw Greig Corti, PA-C in our office 63/2024 for nausea and vomiting.  Prior hospitalization 63/2024 for nausea and vomiting.  Abdominal pelvic CT unrevealing.  Was thought due to cannabis use and adverse side effect of Ozempic .  Treated with Reglan .  Ozempic  was stopped.  Symptoms improved.  Takes MiraLAX  for constipation.  Has chronic opioid use for chronic pain.  Currently takes pantoprazole  40 Mg twice daily for GERD.   Current symptoms: Patient continues to have chronic nausea.  No recent vomiting episodes.  She reports having abdominal pain all over her entire abdomen and all over her entire body.  She states her whole body hurts.  She reports episodes of diarrhea alternating with constipation.  She had episode of loose stools which started 7 or 8 months ago.  Loose stools lasted 3 or 4 months.  She had to wear depends due to incontinence.  After that she developed small hard stools.  Currently having 2 bowel movements per day which are formed.  She denies melena, hematochezia, or weight loss.  Currently on pantoprazole  40 Mg daily for GERD.  Denies breakthrough heartburn.   07/2023 colonoscopy: 2 small (2 mm to 3 mm) polyps removed.  Pathology showed 1 tubular adenoma and 1 colon mucosa/hyperplastic polyp removed.  Good prep.  Diverticulosis.  7-year repeat (due 06/2030).   No previous EGD, HIDA scan, or gallbladder ultrasound.   PMH: CAD, NSTEMI, cardiac stent x 2 in 2020,, type 2 diabetes, peripheral neuropathy, osteoarthritis.  Chronic pain and chronic opioid use.  Echo in 2020 LVEF 50 to 55%.  63 y.o. y/o female has been referred for follow-up of chronic nausea.  Differential includes hyperemesis cannabis syndrome, adverse side effect of medications (opioids, Farxiga ), diabetic gastroparesis, peptic ulcer disease, gastritis, cholelithiasis, biliary  dyskinesia.  History and physical exam is not consistent with cholelithiasis.  She has never had an EGD.  I am ordering an EGD to begin her workup.  She had negative abdominal pelvic CT 05/2023.   1.  Chronic nausea (No current Vomiting).   - EGD: Evaluate for peptic ulcer, gastritis, H. Pylori I discussed risks of EGD with patient to include risk of bleeding, perforation, and risk of sedation.  Patient expressed understanding and agrees to proceed with EGD.  - Continue Reglan  prn - Avoid all marijuana and cannabis - Limit opioid use - If EGD is unrevealing and symptoms persist, then schedule RUQ ultrasound and possible HIDA scan. - Also consider gastric emptying test if she has recurrent vomiting.   2.  GERD - Continue pantoprazole  40 Mg daily - GERD diet   3.  History of adenomatous colon polyp - 7-year repeat colonoscopy   Procedure 08/16/24 - cancelled as she ate a sandwich pre procedure Procedure 08/23/24 - again ate before procedure, cancelled   EGD 08/28/24: - Esophagogastric landmarks were identified: the Z-line was found at 34 cm, the gastroesophageal junction was found at 34 cm and the upper extent of the gastric folds was found at 38 cm from the incisors. Findings: - A 4 cm hiatal hernia was present. - The exam of the esophagus was otherwise normal. - The entire examined stomach was normal. Biopsies were taken with a cold forceps for Helicobacter pylori testing. Of note, due to the hiatal hernia and laxity at the GEJ, there was poor air retention  in the proximal stomach. Retroflexed views of the cardia / fundus were quite limited in this light. - A single 2 to 3 mm sessile polyp was found in the duodenal bulb. The polyp was removed with a cold biopsy forceps. Resection and retrieval were complete. - The exam of the duodenum was otherwise normal.  Recommended a trial of Reglan  5mg  TID and may need to back off GLP1  FINAL DIAGNOSIS       1. Surgical [P], duodenal polyp, polyp (1)  :      - POLYPOID FRAGMENT OF DUODENAL MUCOSA WITH FOVEOLAR METAPLASIA SUGGESTIVE OF      PEPTIC INJURY       2. Surgical [P], gastric antrum and body :      - CHRONIC INACTIVE GASTRITIS WITH INTESTINAL METAPLASIA      - NEGATIVE FOR H. PYLORI ON H&E STAIN.  SEE NOTE.      - NEGATIVE FOR DYSPLASIA OR MALIGNANCY  ADDENDUM - Immunohistochemical stain for H. pylori is negative.  Control is adequate.      Past Medical History:  Diagnosis Date   Arthritis    Campylobacter enteritis 03/2011   [facility] admission   Carpal tunnel syndrome, bilateral    Diabetes mellitus    Gastritis with bleeding due to alcohol 2010   [facility] admission   GERD (gastroesophageal reflux disease)    HIDRADENITIS SUPPURATIVA 04/11/2007   Qualifier: Diagnosis of   By: Celinda MD, Erle Clunes        Hyperlipidemia    Hypertension    no meds   Menopause    Myocardial infarction [facility])    Neck pain    Wears glasses      Past Surgical History:  Procedure Laterality Date   CARPAL TUNNEL RELEASE Left 05/14/2014   Procedure: LEFT ULNAR NEUROPLASTY AT ELBOW AND ENDOSCOPIC CARPAL TUNNEL RELEASE;  Surgeon: Alm DELENA Hummer, MD;  Location: [facility];  Service: Orthopedics;  Laterality: Left;   CORONARY STENT INTERVENTION N/A 10/09/2019   Procedure: CORONARY STENT INTERVENTION;  Surgeon: Ladona Heinz, MD;  Location: [facility];  Service: Cardiovascular;  Laterality: N/A;   CYST EXCISION  1995   under arms   endometrial biopsy  2004   benign   FOOT OSTEOTOMY  2010   right-spurs   LEFT HEART CATH AND CORONARY ANGIOGRAPHY N/A 10/09/2019   Procedure: LEFT HEART CATH AND CORONARY ANGIOGRAPHY;  Surgeon: Ladona Heinz, MD;  Location: [facility];  Service: Cardiovascular;  Laterality: N/A;   TONSILLECTOMY     TOTAL KNEE ARTHROPLASTY Left 02/26/2023   Procedure: LEFT TOTAL KNEE ARTHROPLASTY;  Surgeon: Vernetta Lonni GRADE, MD;  Location: [facility];  Service: Orthopedics;  Laterality: Left;  Needs RNFA    ULNAR NERVE TRANSPOSITION Left 05/14/2014   Procedure: ULNAR NERVE DECOMPRESSION/TRANSPOSITION;  Surgeon: Alm DELENA Hummer, MD;  Location: [facility];  Service: Orthopedics;  Laterality: Left;   Family History  Problem Relation Age of Onset   Hypertension Mother    Diabetes Mother    Heart attack Mother    Stroke Father    Diabetes Father    Heart attack Sister    Diabetes Maternal Grandmother    Hypertension Maternal Grandmother    Colon cancer Neg Hx    Esophageal cancer Neg Hx    Rectal cancer Neg Hx    Stomach cancer Neg Hx    Social History[1] Current Outpatient Medications  Medication Sig Dispense Refill   Ascorbic Acid (VITAMIN  C PO) Take 1 tablet by mouth daily.     aspirin  81 MG chewable tablet Chew 1 tablet (81 mg total) by mouth daily. 30 tablet 3   atorvastatin  (LIPITOR) 40 MG tablet Take 1 tablet (40 mg total) by mouth daily. 90 tablet 3   Blood Glucose Monitoring Suppl (ACCU-CHEK AVIVA PLUS) w/Device KIT Use to check blood sugar 2 times a day 1 kit 0   Continuous Glucose Receiver (FREESTYLE LIBRE 3 READER) DEVI Use to check blood sugar continuously throughout. Change sensor every 15 days. 1 each 0   Continuous Glucose Sensor (FREESTYLE LIBRE 3 PLUS SENSOR) MISC Use to check blood sugar continuously throughout. Change sensor every 15 days. 2 each 6   cyanocobalamin  (VITAMIN B12) 1000 MCG tablet Take 1 tablet (1,000 mcg total) by mouth daily. 90 tablet 0   diclofenac  Sodium (VOLTAREN ) 1 % GEL Apply 4 g topically 4 (four) times daily. 350 g 1   ezetimibe  (ZETIA ) 10 MG tablet Take 1 tablet (10 mg total) by mouth daily. 90 tablet 3   FARXIGA  10 MG TABS tablet Take 1 tablet (10 mg total) by mouth daily. 90 tablet 0   gabapentin  (NEURONTIN ) 600 MG tablet Take 1 tablet (600 mg total) by mouth 3 (three) times daily. 90 tablet 5   glucose blood (BAYER CONTOUR TEST) test strip TEST BLOOD SUGARS 3 TIMES DAILY 200 each 11   glucose blood test strip Use to check  blood sugar 2 times a day 100 each 12   HYDROcodone -acetaminophen  (NORCO/VICODIN) 5-325 MG tablet Take 1 tablet by mouth every 6 (six) hours as needed for moderate pain (pain score 4-6). 30 tablet 0   HYDROmorphone  (DILAUDID ) 2 MG tablet Take 2 mg by mouth as needed for severe pain.     hydrOXYzine  (ATARAX ) 25 MG tablet TAKE 1 TABLET BY MOUTH THREE TIMES DAILY AS NEEDED FOR  ITCHING 90 tablet 0   ibuprofen  (ADVIL ) 800 MG tablet Take 800 mg by mouth 3 (three) times daily.     insulin  aspart (NOVOLOG  FLEXPEN) 100 UNIT/ML FlexPen Inject 6-10 Units into the skin 3 (three) times daily with meals.     insulin  glargine (LANTUS  SOLOSTAR) 100 UNIT/ML Solostar Pen Inject 26 Units into the skin daily.     Insulin  Pen Needle (PEN NEEDLES) 32G X 4 MM MISC Use four times a day with insulin . 400 each 2   Lancet Devices (EASY TOUCH LANCING DEVICE) MISC Use to check diabetes 4 times daily  E 11.65 1 each 11   losartan  (COZAAR ) 25 MG tablet Take 1 tablet (25 mg total) by mouth every evening. 90 tablet 3   metFORMIN  (GLUCOPHAGE -XR) 500 MG 24 hr tablet Take 2 tablets (1,000 mg total) by mouth daily with supper. 180 tablet 3   metoCLOPramide  (REGLAN ) 5 MG tablet Take 1 tablet (5 mg total) by mouth 3 (three) times daily before meals. 90 tablet 0   metoprolol  succinate (TOPROL -XL) 50 MG 24 hr tablet TAKE 1 TABLET BY MOUTH ONCE DAILY. TAKE  WITH  OR  IMMEDIATELY  FOLLOWING  A  MEAL 90 tablet 0   naloxone  (NARCAN ) nasal spray 4 mg/0.1 mL Place 1 spray into the nose as needed (or).     naproxen  (NAPROSYN ) 500 MG tablet Take 1 tablet (500 mg total) by mouth 2 (two) times daily with a meal. 60 tablet 2   ondansetron  (ZOFRAN -ODT) 8 MG disintegrating tablet Take 1 tablet (8 mg total) by mouth every 8 (eight) hours as needed for nausea  or vomiting. 10 tablet 0   pantoprazole  (PROTONIX ) 40 MG tablet Take 1 tablet (40 mg total) by mouth 2 (two) times daily. 180 tablet 0   Semaglutide  (RYBELSUS ) 7 MG TABS Take 1 tablet (7 mg total)  by mouth daily. 30 tablet 2   tiZANidine  (ZANAFLEX ) 4 MG tablet TAKE 1 TABLET BY MOUTH EVERY 6 HOURS AS NEEDED FOR MUSCLE SPASM 90 tablet 0   traMADol  (ULTRAM ) 50 MG tablet Take 50 mg by mouth every 6 (six) hours as needed.     triamcinolone  cream (KENALOG ) 0.1 % Apply 1 Application topically daily.     No current facility-administered medications for this visit.   Allergies[2]   Review of Systems: All systems reviewed and negative except where noted in HPI.    No results found.  Physical Exam: There were no vitals taken for this visit. Constitutional: Pleasant,well-developed, ***female in no acute distress. HEENT: Normocephalic and atraumatic. Conjunctivae are normal. No scleral icterus. Neck supple.  Cardiovascular: Normal rate, regular rhythm.  Pulmonary/chest: Effort normal and breath sounds normal. No wheezing, rales or rhonchi. Abdominal: Soft, nondistended, nontender. Bowel sounds active throughout. There are no masses palpable. No hepatomegaly. Extremities: no edema Lymphadenopathy: No cervical adenopathy noted. Neurological: Alert and oriented to person place and time. Skin: Skin is warm and dry. No rashes noted. Psychiatric: Normal mood and affect. Behavior is normal.   ASSESSMENT: 63 y.o. female here for assessment of the following  No diagnosis found.  PLAN:   Tanda Bleacher, MD    [1]  Social History Tobacco Use   Smoking status: Never    Passive exposure: Past   Smokeless tobacco: Never  Vaping Use   Vaping status: Never Used  Substance Use Topics   Alcohol use: Not Currently   Drug use: Yes    Types: Marijuana    Comment: sometimes; last use on Tuesday, 07/20/23  [2]  Allergies Allergen Reactions   Oxycodone -Acetaminophen  Other (See Comments)    Pt denies having reaction to this   Penicillins Rash   "

## 2024-11-27 ENCOUNTER — Other Ambulatory Visit: Payer: Self-pay

## 2024-11-28 ENCOUNTER — Other Ambulatory Visit: Payer: Self-pay | Admitting: Internal Medicine

## 2024-12-04 ENCOUNTER — Encounter: Payer: Self-pay | Admitting: Orthopedic Surgery

## 2024-12-08 ENCOUNTER — Ambulatory Visit
Admission: RE | Admit: 2024-12-08 | Discharge: 2024-12-08 | Disposition: A | Source: Ambulatory Visit | Attending: Orthopedic Surgery

## 2024-12-08 DIAGNOSIS — T84033D Mechanical loosening of internal left knee prosthetic joint, subsequent encounter: Secondary | ICD-10-CM

## 2024-12-18 ENCOUNTER — Telehealth: Payer: Self-pay | Admitting: Family Medicine

## 2024-12-18 ENCOUNTER — Telehealth: Payer: Self-pay | Admitting: Cardiology

## 2024-12-18 NOTE — Telephone Encounter (Signed)
 Paper Work Dropped Off: pre-operative clearance form  Date: 12/18/2024  Location of paper:  faxed to [employer]

## 2024-12-18 NOTE — Telephone Encounter (Signed)
 I do not see it?

## 2024-12-18 NOTE — Telephone Encounter (Signed)
 Patient dropped off document Surgical Clearance from [facility], to be filled out by provider. [facility] also requesting office visit notes. Form to be sent back via Fax, day of appt. Document is located in providers tray at front office.Please advise at [phone]  Pt scheduled for paperwork on 02/02

## 2024-12-18 NOTE — Telephone Encounter (Signed)
 Hi. This pt came in to drop off ppwk regarding a pre-op clearance. I've left it in your mailbox. Thank you.

## 2024-12-19 NOTE — Telephone Encounter (Signed)
 Received. Placed in hold folder for pt's scheduled appt on 2.2.26

## 2024-12-26 ENCOUNTER — Other Ambulatory Visit: Payer: Self-pay | Admitting: Family Medicine

## 2024-12-26 DIAGNOSIS — I25118 Atherosclerotic heart disease of native coronary artery with other forms of angina pectoris: Secondary | ICD-10-CM

## 2024-12-26 NOTE — Telephone Encounter (Signed)
 Copied from CRM #[id number]. Topic: Clinical - Medication Refill >> Dec 26, 2024 11:12 AM [name] wrote: Medication: aspirin  81 MG chewable tablet Semaglutide  (RYBELSUS ) 7 MG TABS naproxen  (NAPROSYN ) 500 MG tablet atorvastatin  (LIPITOR) 40 MG tablet  Has the patient contacted their pharmacy? No (Agent: If no, request that the patient contact the pharmacy for the refill. If patient does not wish to contact the pharmacy document the reason why and proceed with request.) (Agent: If yes, when and what did the pharmacy advise?)  This is the patient's preferred pharmacy:  [facility] [address], [address] [place] [address] Phone: [phone] Fax: [phone]  Is this the correct pharmacy for this prescription? Yes If no, delete pharmacy and type the correct one.   Has the prescription been filled recently? No  Is the patient out of the medication? Yes  Has the patient been seen for an appointment in the last year OR does the patient have an upcoming appointment? Yes  Can we respond through MyChart? Yes  Agent: Please be advised that Rx refills may take up to 3 business days. We ask that you follow-up with your pharmacy.

## 2024-12-27 ENCOUNTER — Encounter: Payer: Self-pay | Admitting: Podiatry

## 2024-12-27 ENCOUNTER — Other Ambulatory Visit: Payer: Self-pay | Admitting: Family Medicine

## 2024-12-27 ENCOUNTER — Ambulatory Visit: Admitting: Podiatry

## 2024-12-27 ENCOUNTER — Other Ambulatory Visit: Payer: Self-pay

## 2024-12-27 DIAGNOSIS — K219 Gastro-esophageal reflux disease without esophagitis: Secondary | ICD-10-CM

## 2024-12-27 DIAGNOSIS — M79675 Pain in left toe(s): Secondary | ICD-10-CM

## 2024-12-27 DIAGNOSIS — M79674 Pain in right toe(s): Secondary | ICD-10-CM

## 2024-12-27 DIAGNOSIS — B351 Tinea unguium: Secondary | ICD-10-CM

## 2024-12-27 DIAGNOSIS — E1140 Type 2 diabetes mellitus with diabetic neuropathy, unspecified: Secondary | ICD-10-CM | POA: Diagnosis not present

## 2024-12-27 NOTE — Progress Notes (Addendum)
 This patient returns to my office for at risk foot care.  This patient requires this care by a professional since this patient will be at risk due to having diabetic neuropathy.  This patient is unable to cut nails herself since the patient cannot reach her nails.These nails are painful walking and wearing shoes.  This patient presents for at risk foot care today.  General Appearance  Alert, conversant and in no acute stress.  Vascular  Dorsalis pedis and posterior tibial  pulses are palpable  bilaterally.  Capillary return is within normal limits  bilaterally. Temperature is within normal limits  bilaterally.  Neurologic  Senn-Weinstein monofilament wire test diminished  bilaterally. Muscle power within normal limits bilaterally.  Nails Thick disfigured discolored nails with subungual debris  from hallux to fifth toes bilaterally. No evidence of bacterial infection or drainage bilaterally.  Orthopedic  No limitations of motion  feet .  No crepitus or effusions noted.  No bony pathology or digital deformities noted.  Skin  normotropic skin with no porokeratosis noted bilaterally.  No signs of infections or ulcers noted.     Onychomycosis  Pain in right toes  Pain in left toes  Consent was obtained for treatment procedures.   Mechanical debridement of nails 1-5  bilaterally performed with a nail nipper.  Filed with dremel without incident.    Return office visit   9 weeks                  Told patient to return for periodic foot care and evaluation due to potential at risk complications.   Cordella Bold DPM  [facility]

## 2024-12-27 NOTE — Telephone Encounter (Signed)
 Requested medication (s) are due for refill today: yes   Requested medication (s) are on the active medication list: yes   Last refill:  aspirin - 08/28/24 #30 3 refills, rybelsus  - 10/23/24 #30 2 refills, naprosyn - 04/11/24 #60 2 refills, lipitor- 05/01/24 #90 3 refills  Future visit scheduled: yes 01/01/25  Notes to clinic:  Aspirin  last ordered by Dr. Newlin, rybelsus - medication not assigned to a protocol last ordered by Dr. Newlin, naprosyn - manual review of labs, lipitor- last orded by Dr. Newlin. Do you want to refill Rxs?     Requested Prescriptions  Pending Prescriptions Disp Refills   aspirin  81 MG chewable tablet 30 tablet 3    Sig: Chew 1 tablet (81 mg total) by mouth daily.     Analgesics:  NSAIDS - aspirin  Passed - 12/27/2024 10:56 AM      Passed - Cr in normal range and within 360 days    Creat  Date Value Ref Range Status  01/24/2021 0.65 0.50 - 0.99 mg/dL Final    Comment:    For patients >1 years of age, the reference limit for Creatinine is approximately 13% higher for people identified as African-American. .    Creatinine, Ser  Date Value Ref Range Status  06/26/2024 0.68 0.57 - 1.00 mg/dL Final   Creatinine,U  Date Value Ref Range Status  02/16/2012 44.83 mg/dL Final    Comment:       Cutoff Values for Urine Drug Screen, Pain Mgmt          Drug Class           Cutoff (ng/mL)          Amphetamines             500          Barbiturates             200          Cocaine Metabolites      150          Benzodiazepines          200          Methadone                300          Opiates                  300          Phencyclidine             25          Propoxyphene             300          Marijuana Metabolites     50    For medical purposes only.   Creatinine, Urine  Date Value Ref Range Status  08/09/2024 97 20 - 275 mg/dL Final         Passed - eGFR is 10 or above and within 360 days    GFR, Est African American  Date Value Ref Range Status   01/24/2021 111 > OR = 60 mL/min/1.61m2 Final   GFR, Est Non African American  Date Value Ref Range Status  01/24/2021 96 > OR = 60 mL/min/1.79m2 Final   GFR, Estimated  Date Value Ref Range Status  06/02/2023 >60 >60 mL/min Final    Comment:    (NOTE) Calculated using the CKD-EPI Creatinine Equation (2021)    GFR  Date Value Ref Range Status  07/22/2017 122.79 >60.00 mL/min Final   eGFR  Date Value Ref Range Status  06/26/2024 97 >59 mL/min/1.73 Final         Passed - Patient is not pregnant      Passed - Valid encounter within last 12 months    Recent Outpatient Visits           1 month ago Essential hypertension   [place] [facility], Rag[place], MD   2 months ago Type 2 diabetes mellitus with hyperglycemia, with long-term current use of insulin  [facility])   [facility] [facility] [place]. [facility] Fleeta Morris, [place] L, RPH-CPP   3 months ago Pain in both knees, unspecified chronicity   [place] [facility], MD   4 months ago Long term (current) use of insulin  [facility])   [place] [facility] [place] [facility] Of [place]. [facility] Fleeta Morris Garnette LITTIE, RPH-CPP   5 months ago Bilateral foot pain   [place] [facility], MD       Future Appointments             Tomorrow Jeannetta, Lonni ORN, MD [facility] - A Dept Of [place]ol[place] DEL. [facility]             Semaglutide  (RYBELSUS ) 7 MG TABS 30 tablet 2    Sig: Take 1 tablet (7 mg total) by mouth daily.     Off-Protocol Failed - 12/27/2024 10:56 AM      Failed - Medication not assigned to a protocol, review manually.      Passed - Valid encounter within last 12 months    Recent Outpatient Visits           1 month ago Essential hypertension   [place] [facility], Rag[place], MD   2 months ago Type 2 diabetes mellitus  with hyperglycemia, with long-term current use of insulin  [facility])   [place] [facility] Of [place]. [facility] Fleeta Morris, [place] L, RPH-CPP   3 months ago Pain in both knees, unspecified chronicity   [place] [facility], MD   4 months ago Long term (current) use of insulin  [facility])   [place] [facility] [place] [facility] Of [place]. [facility] Fleeta Morris Garnette LITTIE, RPH-CPP   5 months ago Bilateral foot pain   [place] [facility], MD       Future Appointments             Tomorrow Jeannetta, Lonni ORN, MD [facility] Rheumat[facility] - A Dept Of [place] DEL. [facility]             naproxen  (NAPROSYN ) 500 MG tablet 60 tablet 2    Sig: Take 1 tablet (500 mg total) by mouth 2 (two) times daily with a meal.     Analgesics:  NSAIDS Failed - 12/27/2024 10:56 AM      Failed - Manual Review: Labs are only required if the patient has taken medication for more than 8 weeks.      Failed - HGB in normal range and within 360 days    Hemoglobin  Date Value Ref Range Status  06/02/2023 12.8 12.0 -  15.0 g/dL Final  94/78/7975 88.4 11.1 - 15.9 g/dL Final         Failed - PLT in normal range and within 360 days    Platelets  Date Value Ref Range Status  06/02/2023 321 150 - 400 K/uL Final  04/20/2023 471 (H) 150 - 450 x10E3/uL Final         Failed - HCT in normal range and within 360 days    HCT  Date Value Ref Range Status  06/02/2023 41.5 36.0 - 46.0 % Final   Hematocrit  Date Value Ref Range Status  04/20/2023 36.1 34.0 - 46.6 % Final         Passed - Cr in normal range and within 360 days    Creat  Date Value Ref Range Status  01/24/2021 0.65 0.50 - 0.99 mg/dL Final    Comment:    For patients >10 years of age, the reference limit for Creatinine is approximately 13% higher for people identified as African-American. .    Creatinine, Ser  Date  Value Ref Range Status  06/26/2024 0.68 0.57 - 1.00 mg/dL Final   Creatinine,U  Date Value Ref Range Status  02/16/2012 44.83 mg/dL Final    Comment:       Cutoff Values for Urine Drug Screen, Pain Mgmt          Drug Class           Cutoff (ng/mL)          Amphetamines             500          Barbiturates             200          Cocaine Metabolites      150          Benzodiazepines          200          Methadone                300          Opiates                  300          Phencyclidine             25          Propoxyphene             300          Marijuana Metabolites     50    For medical purposes only.   Creatinine, Urine  Date Value Ref Range Status  08/09/2024 97 20 - 275 mg/dL Final         Passed - eGFR is 30 or above and within 360 days    GFR, Est African American  Date Value Ref Range Status  01/24/2021 111 > OR = 60 mL/min/1.43m2 Final   GFR, Est Non African American  Date Value Ref Range Status  01/24/2021 96 > OR = 60 mL/min/1.25m2 Final   GFR, Estimated  Date Value Ref Range Status  06/02/2023 >60 >60 mL/min Final    Comment:    (NOTE) Calculated using the CKD-EPI Creatinine Equation (2021)    GFR  Date Value Ref Range Status  07/22/2017 122.79 >60.00 mL/min Final   eGFR  Date Value Ref Range Status  06/26/2024 97 >59 mL/min/1.73 Final  Passed - Patient is not pregnant      Passed - Valid encounter within last 12 months    Recent Outpatient Visits           1 month ago Essential hypertension   [place] [facility], [place], MD   2 months ago Type 2 diabetes mellitus with hyperglycemia, with long-term current use of insulin  [facility])   [place] [facility] Of [place]. [facility] Fleeta Morris, [place] L, RPH-CPP   3 months ago Pain in both knees, unspecified chronicity   [place] [facility], MD   4 months ago Long term (current) use of  insulin  [facility])   [place] [facility] [place] [facility] Dept Of [place]. [facility] Fleeta Morris Garnette LITTIE, RPH-CPP   5 months ago Bilateral foot pain   [place] [facility], MD       Future Appointments             Tomorrow Jeannetta, Lonni ORN, MD [facility] Rheumatology - A Dept Of [place] DEL. [facility]             atorvastatin  (LIPITOR) 40 MG tablet 90 tablet 3    Sig: Take 1 tablet (40 mg total) by mouth daily.     Cardiovascular:  Antilipid - Statins Failed - 12/27/2024 10:56 AM      Failed - Lipid Panel in normal range within the last 12 months    Cholesterol, Total  Date Value Ref Range Status  06/26/2024 115 100 - 199 mg/dL Final   LDL Cholesterol (Calc)  Date Value Ref Range Status  12/21/2018 150 (H) mg/dL (calc) Final    Comment:    Reference range: <100 . Desirable range <100 mg/dL for primary prevention;   <70 mg/dL for patients with CHD or diabetic patients  with > or = 2 CHD risk factors. SABRA LDL-C is now calculated using the Martin-Hopkins  calculation, which is a validated novel method providing  better accuracy than the Friedewald equation in the  estimation of LDL-C.  Gladis APPLETHWAITE et al. SANDREA. 7986;689(80): 2061-2068  (http://education.[employer].com/faq/FAQ164)    LDL Chol Calc (NIH)  Date Value Ref Range Status  06/26/2024 47 0 - 99 mg/dL Final   Direct LDL  Date Value Ref Range Status  09/25/2015 94.0 mg/dL Final    Comment:    Optimal:  <100 mg/dLNear or Above Optimal:  100-129 mg/dLBorderline High:  130-159 mg/dLHigh:  160-189 mg/dLVery High:  >190 mg/dL   HDL  Date Value Ref Range Status  06/26/2024 43 >39 mg/dL Final   Triglycerides  Date Value Ref Range Status  06/26/2024 145 0 - 149 mg/dL Final         Passed - Patient is not pregnant      Passed - Valid encounter within last 12 months    Recent Outpatient Visits           1 month ago Essential hypertension    [place] [facility], MD   2 months ago Type 2 diabetes mellitus with hyperglycemia, with long-term current use of insulin  [facility])   [place] [facility] Of [place]. [facility] Fleeta Morris, [place] L, RPH-CPP   3 months ago Pain in both knees, unspecified chronicity    [facility]  Tanda Bleacher, MD   4 months ago Long term (current) use of insulin  [facility])   [place] [facility] [place] - [facility] Dept Of [place]. [facility] Fleeta Tonia Garnette LITTIE, RPH-CPP   5 months ago Bilateral foot pain   [place] [facility], MD       Future Appointments             Tomorrow Jeannetta, Lonni ORN, MD [facility] Health Rheumatology - A Dept Of [place] DEL. [facility]

## 2024-12-28 ENCOUNTER — Encounter: Payer: Self-pay | Admitting: Internal Medicine

## 2024-12-28 ENCOUNTER — Other Ambulatory Visit: Payer: Self-pay

## 2024-12-28 ENCOUNTER — Encounter: Payer: Self-pay | Admitting: Family Medicine

## 2024-12-28 ENCOUNTER — Ambulatory Visit: Attending: Internal Medicine | Admitting: Internal Medicine

## 2024-12-28 VITALS — BP 124/71 | HR 71 | Temp 97.8°F | Resp 16 | Ht 63.0 in | Wt 178.0 lb

## 2024-12-28 DIAGNOSIS — M797 Fibromyalgia: Secondary | ICD-10-CM | POA: Diagnosis present

## 2024-12-28 DIAGNOSIS — M7918 Myalgia, other site: Secondary | ICD-10-CM | POA: Diagnosis present

## 2024-12-28 DIAGNOSIS — M2550 Pain in unspecified joint: Secondary | ICD-10-CM | POA: Insufficient documentation

## 2024-12-28 MED ORDER — PANTOPRAZOLE SODIUM 40 MG PO TBEC
40.0000 mg | DELAYED_RELEASE_TABLET | Freq: Two times a day (BID) | ORAL | 0 refills | Status: AC
  Filled 2024-12-28: qty 180, 90d supply, fill #0

## 2024-12-28 MED ORDER — CYCLOBENZAPRINE HCL 10 MG PO TABS: 10.0000 mg | ORAL_TABLET | Freq: Every evening | ORAL | 2 refills | Status: AC | PRN

## 2024-12-28 NOTE — Assessment & Plan Note (Addendum)
 Chronic pain with myofascial and neuropathy-like symptoms, severe impact on sleep and daily activities. Previous treatments include gabapentin , tramadol , methocarbamol . No recent duloxetine  or steroid use. Pain management challenging due to neuropathic nature. - Prescribed cyclobenzaprine  10 mg once daily at night. - Provided information on myofascial pain and fibromyalgia syndrome with resource link. - Advised monitoring for side effects such as drowsiness and dosage adjustment if necessary. - Instructed to contact clinic for medication issues or dose adjustment.  Orders:   cyclobenzaprine  (FLEXERIL ) 10 MG tablet; Take 1 tablet (10 mg total) by mouth at bedtime as needed.

## 2024-12-28 NOTE — Assessment & Plan Note (Addendum)
 Chronic joint pain and stiffness, primarily in knees. Recent left knee replacement with complications, potential revision surgery. Good range of motion post-rehabilitation, persistent pain. - Agree with regimen on continued gabapentin , tramadol , and ibuprofen  as needed. - Follow up with orthopedic clinic for potential revision surgery.

## 2024-12-28 NOTE — Patient Instructions (Signed)
 I recommend checking out the [place] [facility] [place] patient-centered guide for fibromyalgia and chronic pain management: [email or web address]

## 2024-12-28 NOTE — Progress Notes (Signed)
 "  Office Visit Note  Patient: [name]             Date of Birth: 1961-10-08           MRN: [id number]             PCP: Tanda Bleacher, MD Referring: Tanda Bleacher, MD Visit Date: 12/28/2024   Subjective:  Pain (Whole body )  Discussed the use of AI scribe software for clinical note transcription with the patient, who gave verbal consent to proceed.  History of Present Illness   [name] is a 64 year old female here for follow up from last year with chronic joint pain and osteoarthritis who presents with persistent pain and numbness.  She experiences persistent joint pain and stiffness, particularly in her knees and arms. A left knee replacement did not heal as expected, necessitating a revision surgery.  She describes severe pain in her arm, which disrupts her sleep. She uses topical treatments like 'dicks' for temporary relief. She also reports constant numbness in her fingers and toes.  Her current medications include gabapentin  600 mg twice daily, tramadol  twice daily, and ibuprofen  as needed for pain. She also uses methocarbamol  as a muscle relaxer, though she is unsure of the exact dosage. She has not been on any sleep aids or anxiety medications recently.  She has a history of trying medications like Cymbalta  (duloxetine ) in the past, but she does not recall any specific issues with it. She has not used steroids recently and has not been on medications like Lexapro  or Zoloft for anxiety or depression.      Previous HPI 01/13/24 [name] is a 64 year old female with chronic joint pain who presents with increased joint pain and stiffness of multiple areas.   She has experienced a long-standing history of joint pain and stiffness, which has worsened over the past two to three months. The pain is continuous and gradually increasing, affecting her knees, back, and other joints.  She had left knee joint replacement surgery in March last year with Dr. Vernetta.   Despite undergoing left knee replacement surgery, she continues to experience pain and swelling in the operated knee. Financial constraints have limited her ability to pursue physical therapy, but she reports adhering to HEP and engages in water exercises at the gym to manage her symptoms.   She experiences numbness and neuropathy in her shoulders, hands, knees, and feet, which she attributes to diabetes. She has not seen a neurologist recently for these symptoms.  She takes gabapentin  currently 600 mg once daily for neuropathy symptoms.  She is also prescribed tizanidine  reports use of topical Voltaren  but only a small benefit.   She experiences a trigger finger in her left third finger, which occasionally gets stuck. She also notes the presence of bony nodules on her fingers. 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   She reports difficulty sleeping, waking up approximately three times a night to urinate.  Does have fatigue throughout the day.   She has a history of CAD with NSTEMI in 10/2019 with stenting to mid right and distal LAD.  She was a longstanding former smoker quit 2013.   She also has a previous history of carpal tunnel release surgery and ulnar nerve transposition surgery in 2015 with Dr. Sebastian.   Labs reviewed 05/2023 ESR 5  CRP 0.7   Review of Systems  Constitutional:  Positive for fatigue.  HENT:  Positive for mouth dryness. Negative for mouth sores.   Eyes:  Negative for dryness.  Respiratory:  Negative for shortness of breath.   Cardiovascular:  Negative for chest pain and palpitations.  Gastrointestinal:  Positive for constipation and diarrhea. Negative for blood in stool.  Endocrine: Positive for increased urination.  Genitourinary:  Negative for involuntary urination.  Musculoskeletal:  Positive for joint pain, gait problem, joint pain,  myalgias, muscle weakness, morning stiffness, muscle tenderness and myalgias. Negative for joint swelling.  Skin:  Negative for color change, rash, hair loss and sensitivity to sunlight.  Allergic/Immunologic: Negative for susceptible to infections.  Neurological:  Positive for dizziness. Negative for headaches.  Hematological:  Negative for swollen glands.  Psychiatric/Behavioral:  Positive for sleep disturbance. Negative for depressed mood. The patient is not nervous/anxious.     PMFS History:  Patient Active Problem List   Diagnosis Date Noted   Myofascial pain 01/13/2024   Mixed hyperlipidemia due to type 2 diabetes mellitus (HCC) 06/02/2023   Marijuana use, continuous 06/02/2023   Diabetic peripheral neuropathy associated with type 2 diabetes mellitus (HCC) 06/02/2023   Intractable vomiting with nausea 06/02/2023   Arthrofibrosis of total knee arthroplasty, subsequent encounter 04/07/2023   Status post left knee replacement 02/26/2023   Unilateral primary osteoarthritis, left knee 09/09/2022   Intractable nausea and vomiting 07/04/2022   Hypokalemia 07/04/2022   Pain due to onychomycosis of toenails of both feet 04/28/2022   Diabetic neuropathy, type II diabetes mellitus (HCC) 04/28/2022   De Quervain's tenosynovitis, left 11/13/2021   CAD (coronary artery disease) 10/10/2019   Obesity (BMI 30.0-34.9) 10/09/2019   Cervical spondylitis with radiculitis 08/29/2019   Chronic midline posterior neck pain 06/28/2018   Urgency incontinence 06/28/2018   Degenerative joint disease of knee, left 04/26/2018   Carpal tunnel syndrome on both sides 03/29/2018   Anemia 01/02/2018   Class 1 obesity with serious comorbidity and body mass index (BMI) of 32.0 to 32.9 in adult 07/23/2017   Left hip pain 05/04/2017   Polyarthralgia 11/09/2016   Uncontrolled type 2 diabetes mellitus with hyperglycemia, with long-term current use of insulin  (HCC) 07/07/2016   Visit for preventive health examination  06/21/2016   Chronic meniscal tear of knee 08/19/2015   Left knee pain 06/19/2015   Genital herpes 07/31/2014   Ulnar nerve compression 09/21/2013   Noncompliance with diet and medication regimen 11/15/2012   Arthritis    Screening for breast cancer 08/04/2011   Screening for colon cancer 08/04/2011   GERD without esophagitis 11/17/2006   ENDOMETRIAL POLYP 11/17/2006   LOW BACK PAIN 11/17/2006   FIBROIDS, UTERUS 11/16/2006   Hyperlipidemia 11/16/2006   Essential hypertension 11/16/2006    Past Medical History:  Diagnosis Date   Arthritis    Campylobacter enteritis 03/2011   [facility] admission   Carpal tunnel syndrome, bilateral    Diabetes mellitus    Gastritis with bleeding due to alcohol 2010   [facility] admission   GERD (gastroesophageal reflux disease)    HIDRADENITIS SUPPURATIVA 04/11/2007   Qualifier: Diagnosis of   By: Celinda MD, Erle Clunes        Hyperlipidemia    Hypertension    no meds   Menopause    Myocardial infarction [facility])    Neck pain    Wears glasses     Family History  Problem Relation Age of Onset   Hypertension Mother    Diabetes Mother  Heart attack Mother    Stroke Father    Diabetes Father    Heart attack Sister    Diabetes Maternal Grandmother    Hypertension Maternal Grandmother    Colon cancer Neg Hx    Esophageal cancer Neg Hx    Rectal cancer Neg Hx    Stomach cancer Neg Hx    Past Surgical History:  Procedure Laterality Date   CARPAL TUNNEL RELEASE Left 05/14/2014   Procedure: LEFT ULNAR NEUROPLASTY AT ELBOW AND ENDOSCOPIC CARPAL TUNNEL RELEASE;  Surgeon: Alm DELENA Hummer, MD;  Location: [facility];  Service: Orthopedics;  Laterality: Left;   CORONARY STENT INTERVENTION N/A 10/09/2019   Procedure: CORONARY STENT INTERVENTION;  Surgeon: Ladona Heinz, MD;  Location: [facility];  Service: Cardiovascular;  Laterality: N/A;   CYST EXCISION  1995   under arms   endometrial biopsy  2004   benign   FOOT OSTEOTOMY  2010    right-spurs   LEFT HEART CATH AND CORONARY ANGIOGRAPHY N/A 10/09/2019   Procedure: LEFT HEART CATH AND CORONARY ANGIOGRAPHY;  Surgeon: Ladona Heinz, MD;  Location: [facility];  Service: Cardiovascular;  Laterality: N/A;   TONSILLECTOMY     TOTAL KNEE ARTHROPLASTY Left 02/26/2023   Procedure: LEFT TOTAL KNEE ARTHROPLASTY;  Surgeon: Vernetta Lonni GRADE, MD;  Location: [facility];  Service: Orthopedics;  Laterality: Left;  Needs RNFA   ULNAR NERVE TRANSPOSITION Left 05/14/2014   Procedure: ULNAR NERVE DECOMPRESSION/TRANSPOSITION;  Surgeon: Alm DELENA Hummer, MD;  Location: [facility];  Service: Orthopedics;  Laterality: Left;   Social History   Social History Narrative   She works as a arboriculturist.   Right-handed.   One cup caffeine  per day.   She lives at home with daughter.   Immunization History  Administered Date(s) Administered   Influenza Split 11/03/2011   Influenza Whole 10/06/2005, 09/06/2006   Influenza,inj,Quad PF,6+ Mos 10/04/2014, 09/19/2015, 07/22/2016, 09/24/2017, 09/13/2020   Influenza,inj,Quad PF,6-35 Mos 09/09/2022   Influenza-Unspecified 09/09/2013   Pneumococcal Conjugate-13 03/04/2015   Td 06/06/2012   Tdap 11/03/2011     Objective: Vital Signs: BP 124/71   Pulse 71   Temp 97.8 F (36.6 C)   Resp 16   Ht 5' 3 (1.6 m)   Wt 178 lb (80.7 kg)   BMI 31.53 kg/m    Physical Exam Eyes:     Conjunctiva/sclera: Conjunctivae normal.  Cardiovascular:     Rate and Rhythm: Normal rate and regular rhythm.  Pulmonary:     Effort: Pulmonary effort is normal.     Breath sounds: Normal breath sounds.  Lymphadenopathy:     Cervical: No cervical adenopathy.  Skin:    General: Skin is warm and dry.  Neurological:     Mental Status: She is alert.  Psychiatric:        Mood and Affect: Mood normal.      Musculoskeletal Exam:  Widespread tenderness to pressure throughout the neck, shoulders, and throughout upper and lower back involving muscular  areas not well localized to the joints, does have pain radiation up and down from tender points in the back Full range of motion in both wrists and hands, distal Heberden's nodes present, digital cyst on dorsum of right third DIP, triggering at the left third finger There is tenderness to pressure throughout the left lateral hip  Knees full ROM, well-healed surgical incision site, no palpable effusion   Investigation: No additional findings.  Imaging: CT KNEE LEFT WO CONTRAST Result  Date: 12/15/2024 CLINICAL DATA:  Persistent left knee pain. History of left total knee arthroplasty. Concern for hardware loosening. EXAM: CT OF THE LEFT KNEE WITHOUT CONTRAST TECHNIQUE: Multidetector CT imaging of the left knee was performed according to the standard protocol. Multiplanar CT image reconstructions were also generated. RADIATION DOSE REDUCTION: This exam was performed according to the departmental dose-optimization program which includes automated exposure control, adjustment of the mA and/or kV according to patient size and/or use of iterative reconstruction technique. COMPARISON:  Radiographs of the left knee dated 12/20/2023. Three-phase bone scan dated 09/20/2024. FINDINGS: Bones/Joint/Cartilage Status post left total knee arthroplasty. Hardware is intact. There is anterior translation of the tibia relative to the femur. No dislocation. Diffuse osseous demineralization limits the sensitivity of this exam for evaluation for hardware loosening and nondisplaced fractures. Within these limitations no evidence of fracture is identified. There is suggestion of more focal periprosthetic lucency at the medial tibial plateau, suspicious for loosening.Small knee joint effusion. Ligaments Ligaments are suboptimally evaluated by CT. Muscles and Tendons Atrophy of the distal quadriceps musculature and proximal posterior compartment musculature of the calf. Soft tissue No fluid collection. IMPRESSION: 1. Status post left  total knee arthroplasty. There is anterior translation of the tibia relative to the femur. No dislocation. Diffuse osseous demineralization limits the sensitivity of this exam for evaluation for hardware loosening and nondisplaced fractures. Within these limitations no evidence of fracture is identified. There is suggestion of more focal periprosthetic lucency at the medial tibial plateau, suspicious for loosening. 2. Small knee joint effusion. 3. Atrophy of the distal quadriceps musculature and proximal posterior compartment musculature of the calf. Electronically Signed   By: Harrietta Sherry M.D.   On: 12/15/2024 20:05    Recent Labs: Lab Results  Component Value Date   WBC 7.5 06/02/2023   HGB 12.8 06/02/2023   PLT 321 06/02/2023   NA 140 06/26/2024   K 3.9 06/26/2024   CL 103 06/26/2024   CO2 23 06/26/2024   GLUCOSE 252 (H) 06/26/2024   BUN 12 06/26/2024   CREATININE 0.68 06/26/2024   BILITOT 0.3 06/26/2024   ALKPHOS 107 06/26/2024   AST 22 06/26/2024   ALT 20 06/26/2024   PROT 6.6 06/26/2024   ALBUMIN 4.2 06/26/2024   CALCIUM  9.0 06/26/2024   GFRAA 111 01/24/2021    Speciality Comments: No specialty comments available.  Procedures:  No procedures performed Allergies: Penicillins   Assessment / Plan:     Visit Diagnoses:  Assessment & Plan Fibromyalgia Myofascial pain Chronic pain with myofascial and neuropathy-like symptoms, severe impact on sleep and daily activities. Previous treatments include gabapentin , tramadol , methocarbamol . No recent duloxetine  or steroid use. Pain management challenging due to neuropathic nature. - Prescribed cyclobenzaprine  10 mg once daily at night. - Provided information on myofascial pain and fibromyalgia syndrome with resource link. - Advised monitoring for side effects such as drowsiness and dosage adjustment if necessary. - Instructed to contact clinic for medication issues or dose adjustment.  Orders:   cyclobenzaprine  (FLEXERIL ) 10  MG tablet; Take 1 tablet (10 mg total) by mouth at bedtime as needed.  Polyarthralgia Chronic joint pain and stiffness, primarily in knees. Recent left knee replacement with complications, potential revision surgery. Good range of motion post-rehabilitation, persistent pain. - Agree with regimen on continued gabapentin , tramadol , and ibuprofen  as needed. - Follow up with orthopedic clinic for potential revision surgery.       Follow-Up Instructions: No follow-ups on file.   Lonni LELON Ester, MD  Note -  This record has been created using [employer].  Chart creation errors have been sought, but may not always  have been located. Such creation errors do not reflect on  the standard of medical care. "

## 2024-12-29 ENCOUNTER — Other Ambulatory Visit: Payer: Self-pay

## 2024-12-29 ENCOUNTER — Other Ambulatory Visit: Payer: Self-pay | Admitting: Cardiology

## 2024-12-29 DIAGNOSIS — I10 Essential (primary) hypertension: Secondary | ICD-10-CM

## 2024-12-29 MED ORDER — RYBELSUS 7 MG PO TABS
7.0000 mg | ORAL_TABLET | Freq: Every day | ORAL | 2 refills | Status: AC
  Filled 2024-12-29: qty 30, 30d supply, fill #0

## 2024-12-29 MED ORDER — METOCLOPRAMIDE HCL 5 MG PO TABS: 5.0000 mg | ORAL_TABLET | Freq: Three times a day (TID) | ORAL | 0 refills | Status: DC

## 2024-12-29 MED ORDER — ASPIRIN 81 MG PO CHEW
81.0000 mg | CHEWABLE_TABLET | Freq: Every day | ORAL | 3 refills | Status: AC
  Filled 2024-12-29: qty 30, 30d supply, fill #0

## 2024-12-29 MED ORDER — ATORVASTATIN CALCIUM 40 MG PO TABS
40.0000 mg | ORAL_TABLET | Freq: Every day | ORAL | 3 refills | Status: AC
  Filled 2024-12-29: qty 90, 90d supply, fill #0

## 2024-12-29 MED ORDER — NAPROXEN 500 MG PO TABS
500.0000 mg | ORAL_TABLET | Freq: Two times a day (BID) | ORAL | 2 refills | Status: DC
  Filled 2024-12-29: qty 60, 30d supply, fill #0

## 2024-12-30 ENCOUNTER — Encounter (HOSPITAL_COMMUNITY): Payer: Self-pay

## 2024-12-30 ENCOUNTER — Other Ambulatory Visit: Payer: Self-pay

## 2024-12-30 ENCOUNTER — Emergency Department (HOSPITAL_COMMUNITY)

## 2024-12-30 ENCOUNTER — Inpatient Hospital Stay (HOSPITAL_COMMUNITY)
Admission: EM | Admit: 2024-12-30 | Discharge: 2025-01-02 | DRG: 872 | Disposition: A | Attending: Family Medicine | Admitting: Family Medicine

## 2024-12-30 DIAGNOSIS — I2510 Atherosclerotic heart disease of native coronary artery without angina pectoris: Secondary | ICD-10-CM | POA: Diagnosis present

## 2024-12-30 DIAGNOSIS — I10 Essential (primary) hypertension: Secondary | ICD-10-CM | POA: Diagnosis present

## 2024-12-30 DIAGNOSIS — K219 Gastro-esophageal reflux disease without esophagitis: Secondary | ICD-10-CM | POA: Diagnosis present

## 2024-12-30 DIAGNOSIS — E860 Dehydration: Secondary | ICD-10-CM | POA: Diagnosis present

## 2024-12-30 DIAGNOSIS — R0602 Shortness of breath: Secondary | ICD-10-CM

## 2024-12-30 DIAGNOSIS — A09 Infectious gastroenteritis and colitis, unspecified: Secondary | ICD-10-CM | POA: Diagnosis present

## 2024-12-30 DIAGNOSIS — Z1152 Encounter for screening for COVID-19: Secondary | ICD-10-CM

## 2024-12-30 DIAGNOSIS — Z8719 Personal history of other diseases of the digestive system: Secondary | ICD-10-CM

## 2024-12-30 DIAGNOSIS — R1310 Dysphagia, unspecified: Secondary | ICD-10-CM | POA: Diagnosis present

## 2024-12-30 DIAGNOSIS — G8929 Other chronic pain: Secondary | ICD-10-CM | POA: Diagnosis present

## 2024-12-30 DIAGNOSIS — T50995A Adverse effect of other drugs, medicaments and biological substances, initial encounter: Secondary | ICD-10-CM | POA: Diagnosis present

## 2024-12-30 DIAGNOSIS — Z7984 Long term (current) use of oral hypoglycemic drugs: Secondary | ICD-10-CM

## 2024-12-30 DIAGNOSIS — F1210 Cannabis abuse, uncomplicated: Secondary | ICD-10-CM | POA: Diagnosis present

## 2024-12-30 DIAGNOSIS — R079 Chest pain, unspecified: Secondary | ICD-10-CM

## 2024-12-30 DIAGNOSIS — E785 Hyperlipidemia, unspecified: Secondary | ICD-10-CM | POA: Diagnosis present

## 2024-12-30 DIAGNOSIS — M797 Fibromyalgia: Secondary | ICD-10-CM | POA: Diagnosis present

## 2024-12-30 DIAGNOSIS — E66811 Obesity, class 1: Secondary | ICD-10-CM | POA: Diagnosis present

## 2024-12-30 DIAGNOSIS — E1143 Type 2 diabetes mellitus with diabetic autonomic (poly)neuropathy: Secondary | ICD-10-CM | POA: Diagnosis present

## 2024-12-30 DIAGNOSIS — R1084 Generalized abdominal pain: Principal | ICD-10-CM

## 2024-12-30 DIAGNOSIS — Z7982 Long term (current) use of aspirin: Secondary | ICD-10-CM

## 2024-12-30 DIAGNOSIS — K3184 Gastroparesis: Secondary | ICD-10-CM | POA: Diagnosis present

## 2024-12-30 DIAGNOSIS — Z8249 Family history of ischemic heart disease and other diseases of the circulatory system: Secondary | ICD-10-CM

## 2024-12-30 DIAGNOSIS — Y92009 Unspecified place in unspecified non-institutional (private) residence as the place of occurrence of the external cause: Secondary | ICD-10-CM

## 2024-12-30 DIAGNOSIS — E876 Hypokalemia: Secondary | ICD-10-CM | POA: Diagnosis present

## 2024-12-30 DIAGNOSIS — K529 Noninfective gastroenteritis and colitis, unspecified: Secondary | ICD-10-CM | POA: Diagnosis present

## 2024-12-30 DIAGNOSIS — E872 Acidosis, unspecified: Secondary | ICD-10-CM | POA: Diagnosis present

## 2024-12-30 DIAGNOSIS — Z823 Family history of stroke: Secondary | ICD-10-CM

## 2024-12-30 DIAGNOSIS — Z794 Long term (current) use of insulin: Secondary | ICD-10-CM

## 2024-12-30 DIAGNOSIS — Z955 Presence of coronary angioplasty implant and graft: Secondary | ICD-10-CM

## 2024-12-30 DIAGNOSIS — R197 Diarrhea, unspecified: Secondary | ICD-10-CM

## 2024-12-30 DIAGNOSIS — Z6832 Body mass index (BMI) 32.0-32.9, adult: Secondary | ICD-10-CM

## 2024-12-30 DIAGNOSIS — A419 Sepsis, unspecified organism: Principal | ICD-10-CM | POA: Diagnosis present

## 2024-12-30 DIAGNOSIS — I252 Old myocardial infarction: Secondary | ICD-10-CM

## 2024-12-30 DIAGNOSIS — F419 Anxiety disorder, unspecified: Secondary | ICD-10-CM | POA: Diagnosis present

## 2024-12-30 DIAGNOSIS — Z880 Allergy status to penicillin: Secondary | ICD-10-CM

## 2024-12-30 DIAGNOSIS — Z96652 Presence of left artificial knee joint: Secondary | ICD-10-CM | POA: Diagnosis present

## 2024-12-30 DIAGNOSIS — Z79899 Other long term (current) drug therapy: Secondary | ICD-10-CM

## 2024-12-30 DIAGNOSIS — Z833 Family history of diabetes mellitus: Secondary | ICD-10-CM

## 2024-12-30 DIAGNOSIS — R1116 Cannabis hyperemesis syndrome: Secondary | ICD-10-CM | POA: Diagnosis present

## 2024-12-30 DIAGNOSIS — R112 Nausea with vomiting, unspecified: Secondary | ICD-10-CM | POA: Diagnosis present

## 2024-12-30 DIAGNOSIS — E1142 Type 2 diabetes mellitus with diabetic polyneuropathy: Secondary | ICD-10-CM | POA: Diagnosis present

## 2024-12-30 DIAGNOSIS — M545 Low back pain, unspecified: Secondary | ICD-10-CM | POA: Diagnosis present

## 2024-12-30 DIAGNOSIS — L732 Hidradenitis suppurativa: Secondary | ICD-10-CM | POA: Diagnosis present

## 2024-12-30 DIAGNOSIS — J438 Other emphysema: Secondary | ICD-10-CM | POA: Diagnosis present

## 2024-12-30 LAB — I-STAT CG4 LACTIC ACID, ED: Lactic Acid, Venous: 6 mmol/L (ref 0.5–1.9)

## 2024-12-30 LAB — CBG MONITORING, ED: Glucose-Capillary: 276 mg/dL — ABNORMAL HIGH (ref 70–99)

## 2024-12-30 MED ORDER — LACTATED RINGERS IV BOLUS
1000.0000 mL | Freq: Once | INTRAVENOUS | Status: AC
  Administered 2024-12-30: 1000 mL via INTRAVENOUS

## 2024-12-30 MED ORDER — ONDANSETRON HCL 4 MG/2ML IJ SOLN
4.0000 mg | Freq: Once | INTRAMUSCULAR | Status: AC
  Administered 2024-12-30: 4 mg via INTRAVENOUS
  Filled 2024-12-30: qty 2

## 2024-12-30 MED ORDER — MORPHINE SULFATE (PF) 4 MG/ML IV SOLN
4.0000 mg | Freq: Once | INTRAVENOUS | Status: AC
  Administered 2024-12-30: 4 mg via INTRAVENOUS
  Filled 2024-12-30: qty 1

## 2024-12-30 NOTE — ED Triage Notes (Signed)
 BIBA from home, C/O N/V/D, abdominal, and flank pain since 2 am this morning took Zofran  at 1100 and ozempic  at 0600.  Last time she took ozempic  she also had GI symptoms  Took Dulcolax today after diarrhea thinking it was an antibiotic.  PT is not on any antibiotics  156/102 391 CBG 98% O2 118-132 HR  20g Left forearm, 500ml NS

## 2024-12-30 NOTE — ED Provider Notes (Signed)
 " [place] EMERGENCY DEPARTMENT AT [facility] Provider Note   CSN: [id number] Arrival date & time: 12/30/24  2149     Patient presents with: Nausea, Emesis (di), and Diarrhea   [name] is a 65 y.o. female.  Emesis Associated symptoms: abdominal pain, cough and diarrhea   Diarrhea Associated symptoms: abdominal pain and vomiting    Patient is a 64 year old female is in the ED today for concerns for multiple complaints.  Noted to have been having nausea, vomiting, diarrhea x 2 days accompanied with cough, congestion, body aches.  Reporting feeling very heavy recently diagnosed with flu.  However started vomiting bright red blood this morning, noted to be streaking the vomit.  Previous medical history of GERD, HLD, fibromyalgia, HTN, pneumonia, CAD, hidradenitis, diabetes, MI.   Denies fever, headache, hemoptysis, melena, hematochezia, dysuria, hematuria, rashes, lower leg swelling.     Prior to Admission medications  Medication Sig Start Date End Date Taking? Authorizing Provider  Ascorbic Acid (VITAMIN C PO) Take 1 tablet by mouth daily.    [provider]  aspirin  81 MG chewable tablet Chew 1 tablet (81 mg total) by mouth daily. 12/29/24   Tanda Bleacher, MD  atorvastatin  (LIPITOR) 40 MG tablet Take 1 tablet (40 mg total) by mouth daily. 12/29/24   Tanda Bleacher, MD  Blood Glucose Monitoring Suppl (ACCU-CHEK AVIVA PLUS) w/Device KIT Use to check blood sugar 2 times a day 06/04/23   Tanda Bleacher, MD  Continuous Glucose Receiver (FREESTYLE LIBRE 3 READER) DEVI Use to check blood sugar continuously throughout. Change sensor every 15 days. 06/26/24   Newlin, Enobong, MD  Continuous Glucose Sensor (FREESTYLE LIBRE 3 PLUS SENSOR) MISC APPLY 1 SENSOR EVERY 15 DAYS 11/28/24   Trixie File, MD  cyanocobalamin  (VITAMIN B12) 1000 MCG tablet Take 1 tablet (1,000 mcg total) by mouth daily. 05/25/23   Tanda Bleacher, MD  cyclobenzaprine  (FLEXERIL ) 10 MG tablet Take  1 tablet (10 mg total) by mouth at bedtime as needed. 12/28/24   Jeannetta Lonni ORN, MD  diclofenac  Sodium (VOLTAREN ) 1 % GEL Apply 4 g topically 4 (four) times daily. 07/04/24   Tanda Bleacher, MD  ezetimibe  (ZETIA ) 10 MG tablet Take 1 tablet (10 mg total) by mouth daily. 05/01/24   Newlin, Enobong, MD  FARXIGA  10 MG TABS tablet Take 1 tablet (10 mg total) by mouth daily. 05/01/24   Newlin, Enobong, MD  gabapentin  (NEURONTIN ) 600 MG tablet Take 1 tablet (600 mg total) by mouth 3 (three) times daily. 08/04/24   Tanda Bleacher, MD  glucose blood (BAYER CONTOUR TEST) test strip TEST BLOOD SUGARS 3 TIMES DAILY 05/25/23   Tanda Bleacher, MD  glucose blood test strip Use to check blood sugar 2 times a day 06/04/23   Tanda Bleacher, MD  HYDROcodone -acetaminophen  (NORCO/VICODIN) 5-325 MG tablet Take 1 tablet by mouth every 6 (six) hours as needed for moderate pain (pain score 4-6). 09/22/23   Gretta Bertrum ORN, PA-C  HYDROmorphone  (DILAUDID ) 2 MG tablet Take 2 mg by mouth as needed for severe pain.    [provider]  hydrOXYzine  (ATARAX ) 25 MG tablet TAKE 1 TABLET BY MOUTH THREE TIMES DAILY AS NEEDED FOR  ITCHING 03/24/24   Tanda Bleacher, MD  ibuprofen  (ADVIL ) 800 MG tablet Take 800 mg by mouth 3 (three) times daily. 07/15/24   [provider]  insulin  aspart (NOVOLOG  FLEXPEN) 100 UNIT/ML FlexPen Inject 6-10 Units into the skin 3 (three) times daily with meals. 11/08/24   Trixie File,  MD  insulin  glargine (LANTUS  SOLOSTAR) 100 UNIT/ML Solostar Pen Inject 26 Units into the skin daily. 11/08/24   Trixie File, MD  Insulin  Pen Needle (PEN NEEDLES) 32G X 4 MM MISC Use four times a day with insulin . 05/01/24   Delbert Clam, MD  Lancet Devices (EASY TOUCH LANCING DEVICE) MISC Use to check diabetes 4 times daily  E 11.65 05/25/23   Tanda Bleacher, MD  losartan  (COZAAR ) 25 MG tablet Take 1 tablet (25 mg total) by mouth every evening. 05/01/24   Newlin, Enobong, MD  metFORMIN  (GLUCOPHAGE -XR) 500 MG 24  hr tablet Take 2 tablets (1,000 mg total) by mouth daily with supper. 05/01/24   Newlin, Enobong, MD  metoCLOPramide  (REGLAN ) 5 MG tablet Take 1 tablet (5 mg total) by mouth 3 (three) times daily before meals. 12/29/24   Armbruster, Elspeth SQUIBB, MD  metoprolol  succinate (TOPROL -XL) 50 MG 24 hr tablet TAKE 1 TABLET BY MOUTH ONCE DAILY. TAKE  WITH  OR  IMMEDIATELY  FOLLOWING  A  MEAL 12/31/23   Jaycee Greig PARAS, NP  naloxone  (NARCAN ) nasal spray 4 mg/0.1 mL Place 1 spray into the nose as needed (or). 12/09/20   [provider]  naproxen  (NAPROSYN ) 500 MG tablet Take 1 tablet (500 mg total) by mouth 2 (two) times daily with a meal. 12/29/24   Tanda Bleacher, MD  ondansetron  (ZOFRAN -ODT) 8 MG disintegrating tablet Take 1 tablet (8 mg total) by mouth every 8 (eight) hours as needed for nausea or vomiting. 05/31/23   Steinl, Kevin, MD  pantoprazole  (PROTONIX ) 40 MG tablet Take 1 tablet (40 mg total) by mouth 2 (two) times daily. 12/28/24   Tanda Bleacher, MD  Semaglutide  (RYBELSUS ) 7 MG TABS Take 1 tablet (7 mg total) by mouth daily. 12/29/24   Tanda Bleacher, MD  tiZANidine  (ZANAFLEX ) 4 MG tablet TAKE 1 TABLET BY MOUTH EVERY 6 HOURS AS NEEDED FOR MUSCLE SPASM 03/24/24   Tanda Bleacher, MD  traMADol  (ULTRAM ) 50 MG tablet Take 50 mg by mouth every 6 (six) hours as needed. 08/15/24   [provider]  triamcinolone  cream (KENALOG ) 0.1 % Apply 1 Application topically daily.    [provider]    Allergies: Penicillins    Review of Systems  HENT:  Positive for congestion.   Respiratory:  Positive for cough and shortness of breath.   Cardiovascular:  Positive for chest pain.  Gastrointestinal:  Positive for abdominal pain, diarrhea and vomiting.  All other systems reviewed and are negative.   Updated Vital Signs BP (!) 179/93 (BP Location: Right Arm)   Pulse (!) 118   Temp 98.1 F (36.7 C) (Oral)   Resp 20   Ht 5' 4 (1.626 m)   Wt 81.6 kg   SpO2 100%   BMI 30.90 kg/m   Physical  Exam Vitals and nursing note reviewed.  Constitutional:      Appearance: Normal appearance. She is ill-appearing. She is not diaphoretic.  HENT:     Head: Normocephalic and atraumatic.  Eyes:     General: No scleral icterus.       Right eye: No discharge.        Left eye: No discharge.     Extraocular Movements: Extraocular movements intact.     Conjunctiva/sclera: Conjunctivae normal.  Cardiovascular:     Rate and Rhythm: Regular rhythm. Tachycardia present.     Pulses: Normal pulses.     Heart sounds: Normal heart sounds. No murmur heard.    No friction rub. No  gallop.  Pulmonary:     Effort: Pulmonary effort is normal. No respiratory distress.     Breath sounds: No stridor. No wheezing, rhonchi or rales.  Chest:     Chest wall: No tenderness.  Abdominal:     General: Abdomen is flat. There is no distension.     Palpations: Abdomen is soft.     Tenderness: There is generalized abdominal tenderness. There is no right CVA tenderness or left CVA tenderness.  Musculoskeletal:        General: No swelling, deformity or signs of injury.     Cervical back: Normal range of motion. No rigidity.  Skin:    General: Skin is warm and dry.     Findings: No bruising, erythema or lesion.  Neurological:     General: No focal deficit present.     Mental Status: She is alert and oriented to person, place, and time. Mental status is at baseline.     Sensory: No sensory deficit.     Motor: No weakness.  Psychiatric:        Mood and Affect: Mood normal.     (all labs ordered are listed, but only abnormal results are displayed) Labs Reviewed  CBG MONITORING, ED - Abnormal; Notable for the following components:      Result Value   Glucose-Capillary 276 (*)    All other components within normal limits  RESP PANEL BY RT-PCR (RSV, FLU A&B, COVID)  RVPGX2  CBC WITH DIFFERENTIAL/PLATELET  COMPREHENSIVE METABOLIC PANEL WITH GFR  LIPASE, BLOOD  URINALYSIS, ROUTINE W REFLEX MICROSCOPIC   BETA-HYDROXYBUTYRIC ACID  I-STAT CG4 LACTIC ACID, ED  TYPE AND SCREEN  TROPONIN T, HIGH SENSITIVITY    EKG: None  Radiology: DG Chest 2 View Result Date: 12/30/2024 EXAM: 2 VIEW(S) XRAY OF THE CHEST 12/30/2024 10:58:00 PM COMPARISON: None available. CLINICAL HISTORY: Cough, congestion, shortness of breath. FINDINGS: LUNGS AND PLEURA: No focal pulmonary opacity. No pleural effusion. No pneumothorax. HEART AND MEDIASTINUM: Calcified aorta. Coronary artery stents noted. BONES AND SOFT TISSUES: Thoracic degenerative changes. IMPRESSION: 1. No acute cardiopulmonary abnormality. 2. Aortic atherosclerosis. Electronically signed by: Greig Pique MD 12/30/2024 11:03 PM EST RP Workstation: [id number]   Procedures   Medications Ordered in the ED  morphine  (PF) 4 MG/ML injection 4 mg (has no administration in time range)  lactated ringers  bolus 1,000 mL (1,000 mLs Intravenous New Bag/Given 12/30/24 2257)  ondansetron  (ZOFRAN ) injection 4 mg (4 mg Intravenous Given 12/30/24 2255)     Medical Decision Making  This patient is a 64 year old female who presents to the ED for concern of 2 days of nausea, vomiting, diarrhea and presence of URI symptoms with known flu sick contacts.  Has reported blood streaking in vomit starting today. Additionally noting shortness of breath that has developed.  On physical exam, patient is afebrile, alert and orient x 4, speaking in full sentences, nontachypneic.  Notably was tachypneic and in acute distress secondary to pain in abdomen.  Notably generally tender across entire abdomen.  With no CVA tenderness.  LCTAB.  No murmur noted.  No lower leg edema.  With patient's presentation, suspecting possible URI with dehydration however with patient comorbidities, will provide broad workup to rule out DKA, ACS, intra-abdominal pathology.  Provided a liter of fluid, Zofran , morphine .   Awaiting labs to limit risk of imaging, patient care being transferred over to Dr.  Jerral.  Differential diagnoses prior to evaluation: The emergent differential diagnosis includes, but is not limited to, diverticulitis, gastroenteritis, pancreatitis,  cholecystitis, appendicitis, cholangitis, mesenteric ischemia, URI, pneumonia, ACS, PE, sepsis, DKA. This is not an exhaustive differential.   Past Medical History / Co-morbidities / Social History: GERD, HLD, fibromyalgia, HTN, pneumonia, CAD, hidradenitis, diabetes, MI  Additional history: Chart reviewed. Pertinent results include:   Last seen by rheumatology on 12/28/2024 for fibromyalgia, polyarthralgia and myofascial pain, provided Flexeril   Lab Tests/Imaging studies: I personally interpreted labs/imaging and the pertinent results include:   Pending CBC, CMP, lactic, lipase, UA, type and screen, troponin, beta-hydroxybutyrate acid, UA, CT abdomen pelvis and chest POC glucose 276.   Chest x-ray unremarkable I agree with the radiologist interpretation.  Cardiac monitoring: EKG obtained and interpreted by myself and attending physician which shows: Sinus tachycardia   Medications: I ordered medication including LR, Zofran .  I have reviewed the patients home medicines and have made adjustments as needed.  Critical Interventions: None  Social Determinants of Health: none  Disposition: 11:42 PM Care of  [name] transferred to  Dr. Dexter at the end of my shift as the patient will require reassessment once labs/imaging have resulted. Patient presentation, ED course, and plan of care discussed with review of all pertinent labs and imaging. Please see his/her note for further details regarding further ED course and disposition. Plan at time of handoff is await labs and imaging and reevaluate likely admit. This may be altered or completely changed at the discretion of the oncoming team pending results of further workup.    Final diagnoses:  Generalized abdominal pain  Nausea vomiting and diarrhea  Chest  pain, unspecified type  Shortness of breath    ED Discharge Orders     None          [name], [place] 12/30/24 2342  "

## 2024-12-30 NOTE — ED Provider Notes (Signed)
 Care of patient received from prior provider at 11:28 PM, please see their note for complete H/P and care plan.  Received handoff per ED course.  Clinical Course as of 12/30/24 2328  Sat Dec 30, 2024  2327 Stable HO from AA HTN DM/RA Ccx of NVD and URI Sxs. Flu + family Hematemesis with other emesis [CC]    Clinical Course User Index [CC] Jerral Meth, MD   CRITICAL CARE Performed by: Meth Jerral   Total critical care time: 45 minutes  Critical care time was exclusive of separately billable procedures and treating other patients.  Critical care was necessary to treat or prevent imminent or life-threatening deterioration.  Critical care was time spent personally by me on the following activities: development of treatment plan with patient and/or surrogate as well as nursing, discussions with consultants, evaluation of patient's response to treatment, examination of patient, obtaining history from patient or surrogate, ordering and performing treatments and interventions, ordering and review of laboratory studies, ordering and review of radiographic studies, pulse oximetry and re-evaluation of patient's condition.  Reassessment: 64 year old female presenting with a chief complaint of abdominal pain.  Appears to have severe colitis/gastroenteritis.  Likely viral although may also be related to recently restarting GLP-1 agonist. Responding appropriately to medical therapy with downtrending lactic acid but patient presented with critical dehydration and will need ongoing care and management.  Disposition:   Based on the above findings, I believe this patient is stable for admission.    Patient/family educated about specific findings on our evaluation and explained exact reasons for admission.  Patient/family educated about clinical situation and time was allowed to answer questions.   Admission team communicated with and agreed with need for admission. Patient admitted. Patient   ready to move at this time.     Emergency Department Medication Summary:   Medications  lactated ringers  bolus 1,000 mL ( Intravenous Restarted 12/31/24 0212)  ondansetron  (ZOFRAN ) injection 4 mg (4 mg Intravenous Given 12/30/24 2255)  morphine  (PF) 4 MG/ML injection 4 mg (4 mg Intravenous Given 12/30/24 2347)  metroNIDAZOLE  (FLAGYL ) IVPB 500 mg (0 mg Intravenous Stopped 12/31/24 0258)  cefTRIAXone  (ROCEPHIN ) 2 g in sodium chloride  0.9 % 100 mL IVPB (0 g Intravenous Stopped 12/31/24 0125)  iohexol  (OMNIPAQUE ) 350 MG/ML injection 100 mL (100 mLs Intravenous Contrast Given 12/31/24 0104)  lactated ringers  bolus 1,000 mL (0 mLs Intravenous Stopped 12/31/24 0344)            Jerral Meth, MD 12/31/24 [phone]

## 2024-12-31 ENCOUNTER — Emergency Department (HOSPITAL_COMMUNITY)

## 2024-12-31 DIAGNOSIS — R197 Diarrhea, unspecified: Secondary | ICD-10-CM

## 2024-12-31 DIAGNOSIS — A419 Sepsis, unspecified organism: Secondary | ICD-10-CM

## 2024-12-31 DIAGNOSIS — K529 Noninfective gastroenteritis and colitis, unspecified: Secondary | ICD-10-CM | POA: Diagnosis present

## 2024-12-31 LAB — COMPREHENSIVE METABOLIC PANEL WITH GFR
ALT: 19 U/L (ref 0–44)
AST: 21 U/L (ref 15–41)
Albumin: 4.4 g/dL (ref 3.5–5.0)
Alkaline Phosphatase: 125 U/L (ref 38–126)
Anion gap: 22 — ABNORMAL HIGH (ref 5–15)
BUN: 25 mg/dL — ABNORMAL HIGH (ref 8–23)
CO2: 24 mmol/L (ref 22–32)
Calcium: 9.9 mg/dL (ref 8.9–10.3)
Chloride: 98 mmol/L (ref 98–111)
Creatinine, Ser: 0.86 mg/dL (ref 0.44–1.00)
GFR, Estimated: >60 mL/min
Glucose, Bld: 303 mg/dL — ABNORMAL HIGH (ref 70–99)
Potassium: 3.4 mmol/L — ABNORMAL LOW (ref 3.5–5.1)
Sodium: 144 mmol/L (ref 135–145)
Total Bilirubin: 0.8 mg/dL (ref 0.0–1.2)
Total Protein: 8.2 g/dL — ABNORMAL HIGH (ref 6.5–8.1)

## 2024-12-31 LAB — BASIC METABOLIC PANEL WITH GFR
Anion gap: 14 (ref 5–15)
BUN: 19 mg/dL (ref 8–23)
CO2: 29 mmol/L (ref 22–32)
Calcium: 9.4 mg/dL (ref 8.9–10.3)
Chloride: 101 mmol/L (ref 98–111)
Creatinine, Ser: 0.72 mg/dL (ref 0.44–1.00)
GFR, Estimated: >60 mL/min
Glucose, Bld: 232 mg/dL — ABNORMAL HIGH (ref 70–99)
Potassium: 3.6 mmol/L (ref 3.5–5.1)
Sodium: 144 mmol/L (ref 135–145)

## 2024-12-31 LAB — BLOOD GAS, VENOUS
Acid-Base Excess: 5.5 mmol/L — ABNORMAL HIGH (ref 0.0–2.0)
Bicarbonate: 31.1 mmol/L — ABNORMAL HIGH (ref 20.0–28.0)
O2 Saturation: 57.7 %
Patient temperature: 37
pCO2, Ven: 48 mmHg (ref 44–60)
pH, Ven: 7.42 (ref 7.25–7.43)
pO2, Ven: 33 mmHg (ref 32–45)

## 2024-12-31 LAB — CBC
HCT: 44.9 % (ref 36.0–46.0)
HCT: 36.3 % (ref 36.0–46.0)
Hemoglobin: 13.9 g/dL (ref 12.0–15.0)
Hemoglobin: 12.1 g/dL (ref 12.0–15.0)
MCH: 28.6 pg (ref 26.0–34.0)
MCH: 29.6 pg (ref 26.0–34.0)
MCHC: 31 g/dL (ref 30.0–36.0)
MCHC: 33.3 g/dL (ref 30.0–36.0)
MCV: 92.4 fL (ref 80.0–100.0)
MCV: 88.8 fL (ref 80.0–100.0)
Platelets: 367 10*3/uL (ref 150–400)
Platelets: 318 10*3/uL (ref 150–400)
RBC: 4.86 MIL/uL (ref 3.87–5.11)
RBC: 4.09 MIL/uL (ref 3.87–5.11)
RDW: 13.2 % (ref 11.5–15.5)
RDW: 13.3 % (ref 11.5–15.5)
WBC: 12.3 10*3/uL — ABNORMAL HIGH (ref 4.0–10.5)
WBC: 11.9 10*3/uL — ABNORMAL HIGH (ref 4.0–10.5)
nRBC: 0 % (ref 0.0–0.2)
nRBC: 0 % (ref 0.0–0.2)

## 2024-12-31 LAB — CBC WITH DIFFERENTIAL/PLATELET
Abs Immature Granulocytes: 0.05 10*3/uL (ref 0.00–0.07)
Basophils Absolute: 0 10*3/uL (ref 0.0–0.1)
Basophils Relative: 0 %
Eosinophils Absolute: 0 10*3/uL (ref 0.0–0.5)
Eosinophils Relative: 0 %
HCT: 45.1 % (ref 36.0–46.0)
Hemoglobin: 14.6 g/dL (ref 12.0–15.0)
Immature Granulocytes: 0 %
Lymphocytes Relative: 9 %
Lymphs Abs: 1.2 10*3/uL (ref 0.7–4.0)
MCH: 29.2 pg (ref 26.0–34.0)
MCHC: 32.4 g/dL (ref 30.0–36.0)
MCV: 90.2 fL (ref 80.0–100.0)
Monocytes Absolute: 0.5 10*3/uL (ref 0.1–1.0)
Monocytes Relative: 4 %
Neutro Abs: 11.7 10*3/uL — ABNORMAL HIGH (ref 1.7–7.7)
Neutrophils Relative %: 87 %
Platelets: 384 10*3/uL (ref 150–400)
RBC: 5 MIL/uL (ref 3.87–5.11)
RDW: 13.3 % (ref 11.5–15.5)
WBC: 13.4 10*3/uL — ABNORMAL HIGH (ref 4.0–10.5)
nRBC: 0 % (ref 0.0–0.2)

## 2024-12-31 LAB — URINALYSIS, ROUTINE W REFLEX MICROSCOPIC
Bilirubin Urine: NEGATIVE
Glucose, UA: >=500 mg/dL — AB
Hgb urine dipstick: NEGATIVE
Ketones, ur: 80 mg/dL — AB
Leukocytes,Ua: NEGATIVE
Nitrite: NEGATIVE
Protein, ur: 30 mg/dL — AB
Specific Gravity, Urine: 1.027 (ref 1.005–1.030)
pH: 5 (ref 5.0–8.0)

## 2024-12-31 LAB — LIPASE, BLOOD: Lipase: 20 U/L (ref 11–51)

## 2024-12-31 LAB — LACTIC ACID, PLASMA: Lactic Acid, Venous: 2.1 mmol/L (ref 0.5–1.9)

## 2024-12-31 LAB — TROPONIN T, HIGH SENSITIVITY
Troponin T High Sensitivity: 10 ng/L (ref 0–19)
Troponin T High Sensitivity: 13 ng/L (ref 0–19)

## 2024-12-31 LAB — RESP PANEL BY RT-PCR (RSV, FLU A&B, COVID)  RVPGX2
Influenza A by PCR: NEGATIVE
Influenza B by PCR: NEGATIVE
Resp Syncytial Virus by PCR: NEGATIVE
SARS Coronavirus 2 by RT PCR: NEGATIVE

## 2024-12-31 LAB — TYPE AND SCREEN
ABO/RH(D): O POS
Antibody Screen: NEGATIVE

## 2024-12-31 LAB — BETA-HYDROXYBUTYRIC ACID
Beta-Hydroxybutyric Acid: 2.63 mmol/L — ABNORMAL HIGH (ref 0.05–0.27)
Beta-Hydroxybutyric Acid: 2.63 mmol/L — ABNORMAL HIGH (ref 0.05–0.27)

## 2024-12-31 LAB — I-STAT CG4 LACTIC ACID, ED: Lactic Acid, Venous: 4.3 mmol/L (ref 0.5–1.9)

## 2024-12-31 LAB — GLUCOSE, CAPILLARY
Glucose-Capillary: 101 mg/dL — ABNORMAL HIGH (ref 70–99)
Glucose-Capillary: 108 mg/dL — ABNORMAL HIGH (ref 70–99)
Glucose-Capillary: 201 mg/dL — ABNORMAL HIGH (ref 70–99)

## 2024-12-31 LAB — CBG MONITORING, ED
Glucose-Capillary: 227 mg/dL — ABNORMAL HIGH (ref 70–99)
Glucose-Capillary: 190 mg/dL — ABNORMAL HIGH (ref 70–99)
Glucose-Capillary: 104 mg/dL — ABNORMAL HIGH (ref 70–99)

## 2024-12-31 LAB — HEMOGLOBIN A1C
Hgb A1c MFr Bld: 6.6 % — ABNORMAL HIGH (ref 4.8–5.6)
Mean Plasma Glucose: 142.72 mg/dL

## 2024-12-31 LAB — MAGNESIUM: Magnesium: 2.1 mg/dL (ref 1.7–2.4)

## 2024-12-31 MED ORDER — PANTOPRAZOLE SODIUM 40 MG IV SOLR
40.0000 mg | Freq: Two times a day (BID) | INTRAVENOUS | Status: DC
  Administered 2024-12-31 – 2025-01-02 (×5): 40 mg via INTRAVENOUS
  Filled 2024-12-31 (×5): qty 10

## 2024-12-31 MED ORDER — ACETAMINOPHEN 325 MG PO TABS: 650.0000 mg | ORAL_TABLET | Freq: Four times a day (QID) | ORAL | Status: DC | PRN

## 2024-12-31 MED ORDER — POTASSIUM CHLORIDE 10 MEQ/100ML IV SOLN
10.0000 meq | INTRAVENOUS | Status: AC
  Administered 2024-12-31 (×2): 10 meq via INTRAVENOUS
  Filled 2024-12-31 (×2): qty 100

## 2024-12-31 MED ORDER — SODIUM CHLORIDE 0.9 % IV SOLN
2.0000 g | Freq: Once | INTRAVENOUS | Status: AC
  Administered 2024-12-31: 2 g via INTRAVENOUS
  Filled 2024-12-31: qty 20

## 2024-12-31 MED ORDER — SODIUM CHLORIDE 0.9 % IV SOLN: INTRAVENOUS | Status: AC

## 2024-12-31 MED ORDER — IOHEXOL 350 MG/ML SOLN
100.0000 mL | Freq: Once | INTRAVENOUS | Status: AC | PRN
  Administered 2024-12-31: 100 mL via INTRAVENOUS

## 2024-12-31 MED ORDER — ONDANSETRON HCL 4 MG/2ML IJ SOLN: 4.0000 mg | Freq: Four times a day (QID) | INTRAMUSCULAR | Status: DC | PRN

## 2024-12-31 MED ORDER — NALOXONE HCL 0.4 MG/ML IJ SOLN: 0.4000 mg | INTRAMUSCULAR | Status: DC | PRN

## 2024-12-31 MED ORDER — LACTATED RINGERS IV BOLUS
1000.0000 mL | Freq: Once | INTRAVENOUS | Status: AC
  Administered 2024-12-31: 1000 mL via INTRAVENOUS

## 2024-12-31 MED ORDER — MORPHINE SULFATE (PF) 2 MG/ML IV SOLN
1.0000 mg | INTRAVENOUS | Status: DC | PRN
  Administered 2024-12-31: 1 mg via INTRAVENOUS
  Filled 2024-12-31 (×2): qty 1

## 2024-12-31 MED ORDER — INSULIN ASPART 100 UNIT/ML IJ SOLN
0.0000 [IU] | INTRAMUSCULAR | Status: DC
  Administered 2024-12-31 (×3): 3 [IU] via SUBCUTANEOUS
  Administered 2025-01-01: 2 [IU] via SUBCUTANEOUS
  Administered 2025-01-01: 1 [IU] via SUBCUTANEOUS
  Administered 2025-01-01: 3 [IU] via SUBCUTANEOUS
  Administered 2025-01-01: 2 [IU] via SUBCUTANEOUS
  Administered 2025-01-02: 1 [IU] via SUBCUTANEOUS
  Administered 2025-01-02: 2 [IU] via SUBCUTANEOUS
  Filled 2024-12-31: qty 2
  Filled 2024-12-31: qty 1
  Filled 2024-12-31: qty 3
  Filled 2024-12-31: qty 2
  Filled 2024-12-31: qty 1
  Filled 2024-12-31 (×2): qty 3
  Filled 2024-12-31: qty 1
  Filled 2024-12-31: qty 2

## 2024-12-31 MED ORDER — SODIUM CHLORIDE 0.9 % IV SOLN
2.0000 g | INTRAVENOUS | Status: DC
  Administered 2024-12-31: 2 g via INTRAVENOUS
  Filled 2024-12-31: qty 20

## 2024-12-31 MED ORDER — ACETAMINOPHEN 650 MG RE SUPP: 650.0000 mg | Freq: Four times a day (QID) | RECTAL | Status: DC | PRN

## 2024-12-31 MED ORDER — METRONIDAZOLE 500 MG/100ML IV SOLN
500.0000 mg | Freq: Once | INTRAVENOUS | Status: AC
  Administered 2024-12-31: 500 mg via INTRAVENOUS
  Filled 2024-12-31: qty 100

## 2024-12-31 MED ORDER — METRONIDAZOLE 500 MG/100ML IV SOLN
500.0000 mg | Freq: Two times a day (BID) | INTRAVENOUS | Status: DC
  Administered 2024-12-31 – 2025-01-01 (×2): 500 mg via INTRAVENOUS
  Filled 2024-12-31 (×2): qty 100

## 2024-12-31 MED ORDER — METOCLOPRAMIDE HCL 5 MG/ML IJ SOLN
10.0000 mg | Freq: Three times a day (TID) | INTRAMUSCULAR | Status: DC
  Administered 2024-12-31: 10 mg via INTRAVENOUS
  Filled 2024-12-31: qty 2

## 2024-12-31 NOTE — H&P (Signed)
 " History and Physical    [name] FMW:[id number] DOB: [birth year] DOA: 12/30/2024  PCP: Tanda Bleacher, MD  Patient coming from: Home  Chief Complaint: Multiple complaints  HPI: [name] is a 64 y.o. female with medical history significant of insulin -dependent type 2 diabetes, peripheral neuropathy, gastritis, GERD, hidradenitis suppurativa, hyperlipidemia, hypertension, CAD, PAD, marijuana abuse, anxiety, fibromyalgia, myofascial pain, polyarthralgia, class I obesity presented to the ED with complaints of nausea, vomiting, diarrhea, and URI symptoms.  Tachycardic on arrival but afebrile.  Not hypoxic.  Labs notable for WBC count 13.4, potassium 3.4, glucose 303, bicarb 24, anion gap 22, BUN 25, creatinine 0.86, normal lipase and LFTs, troponin negative x 2, beta hydroxybutyric acid 2.63, UA with 80 ketones but not suggestive of infection, COVID/influenza/RSV PCR negative, lactate 6.0> 4.3, blood cultures in process.  CT angiogram abdomen pelvis showing findings suggestive of colitis.  CT chest with contrast showing no acute findings. Patient was given morphine , Zofran , ceftriaxone , metronidazole , and 2 L IV fluids in the ED.  [facility] called to admit.  Patient is reporting 2-day history of severe generalized abdominal pain, nausea, vomiting, and nonbloody diarrhea.  She has not been able to tolerate any p.o. intake.  Patient states she is vomiting so much that her vomit is now blood-tinged.  Reports smoking marijuana all the time.  She has had some shortness of breath and chest pain whenever she vomits but not otherwise.  No other complaints.  Review of Systems:  Review of Systems  All other systems reviewed and are negative.   Past Medical History:  Diagnosis Date   Arthritis    Campylobacter enteritis 03/2011   [facility] admission   Carpal tunnel syndrome, bilateral    Diabetes mellitus    Gastritis with bleeding due to alcohol 2010   [facility] admission   GERD (gastroesophageal reflux  disease)    HIDRADENITIS SUPPURATIVA 04/11/2007   Qualifier: Diagnosis of   By: Celinda MD, Erle Clunes        Hyperlipidemia    Hypertension    no meds   Menopause    Myocardial infarction [facility])    Neck pain    Wears glasses     Past Surgical History:  Procedure Laterality Date   CARPAL TUNNEL RELEASE Left 05/14/2014   Procedure: LEFT ULNAR NEUROPLASTY AT ELBOW AND ENDOSCOPIC CARPAL TUNNEL RELEASE;  Surgeon: Alm DELENA Hummer, MD;  Location: [facility];  Service: Orthopedics;  Laterality: Left;   CORONARY STENT INTERVENTION N/A 10/09/2019   Procedure: CORONARY STENT INTERVENTION;  Surgeon: Ladona Heinz, MD;  Location: [facility];  Service: Cardiovascular;  Laterality: N/A;   CYST EXCISION  1995   under arms   endometrial biopsy  2004   benign   FOOT OSTEOTOMY  2010   right-spurs   LEFT HEART CATH AND CORONARY ANGIOGRAPHY N/A 10/09/2019   Procedure: LEFT HEART CATH AND CORONARY ANGIOGRAPHY;  Surgeon: Ladona Heinz, MD;  Location: [facility];  Service: Cardiovascular;  Laterality: N/A;   TONSILLECTOMY     TOTAL KNEE ARTHROPLASTY Left 02/26/2023   Procedure: LEFT TOTAL KNEE ARTHROPLASTY;  Surgeon: Vernetta Lonni GRADE, MD;  Location: [facility];  Service: Orthopedics;  Laterality: Left;  Needs RNFA   ULNAR NERVE TRANSPOSITION Left 05/14/2014   Procedure: ULNAR NERVE DECOMPRESSION/TRANSPOSITION;  Surgeon: Alm DELENA Hummer, MD;  Location: [facility];  Service: Orthopedics;  Laterality: Left;     reports that she has never smoked. She has been exposed to  tobacco smoke. She has never used smokeless tobacco. She reports that she does not currently use alcohol. She reports current drug use. Drug: Marijuana.  Allergies[1]  Family History  Problem Relation Age of Onset   Hypertension Mother    Diabetes Mother    Heart attack Mother    Stroke Father    Diabetes Father    Heart attack Sister    Diabetes Maternal Grandmother    Hypertension Maternal  Grandmother    Colon cancer Neg Hx    Esophageal cancer Neg Hx    Rectal cancer Neg Hx    Stomach cancer Neg Hx     Prior to Admission medications  Medication Sig Start Date End Date Taking? Authorizing Provider  Ascorbic Acid (VITAMIN C PO) Take 1 tablet by mouth daily.    [provider]  aspirin  81 MG chewable tablet Chew 1 tablet (81 mg total) by mouth daily. 12/29/24   Tanda Bleacher, MD  atorvastatin  (LIPITOR) 40 MG tablet Take 1 tablet (40 mg total) by mouth daily. 12/29/24   Tanda Bleacher, MD  Blood Glucose Monitoring Suppl (ACCU-CHEK AVIVA PLUS) w/Device KIT Use to check blood sugar 2 times a day 06/04/23   Tanda Bleacher, MD  Continuous Glucose Receiver (FREESTYLE LIBRE 3 READER) DEVI Use to check blood sugar continuously throughout. Change sensor every 15 days. 06/26/24   Newlin, Enobong, MD  Continuous Glucose Sensor (FREESTYLE LIBRE 3 PLUS SENSOR) MISC APPLY 1 SENSOR EVERY 15 DAYS 11/28/24   Trixie File, MD  cyanocobalamin  (VITAMIN B12) 1000 MCG tablet Take 1 tablet (1,000 mcg total) by mouth daily. 05/25/23   Tanda Bleacher, MD  cyclobenzaprine  (FLEXERIL ) 10 MG tablet Take 1 tablet (10 mg total) by mouth at bedtime as needed. 12/28/24   Jeannetta Lonni ORN, MD  diclofenac  Sodium (VOLTAREN ) 1 % GEL Apply 4 g topically 4 (four) times daily. 07/04/24   Tanda Bleacher, MD  ezetimibe  (ZETIA ) 10 MG tablet Take 1 tablet (10 mg total) by mouth daily. 05/01/24   Newlin, Enobong, MD  FARXIGA  10 MG TABS tablet Take 1 tablet (10 mg total) by mouth daily. 05/01/24   Newlin, Enobong, MD  gabapentin  (NEURONTIN ) 600 MG tablet Take 1 tablet (600 mg total) by mouth 3 (three) times daily. 08/04/24   Tanda Bleacher, MD  glucose blood (BAYER CONTOUR TEST) test strip TEST BLOOD SUGARS 3 TIMES DAILY 05/25/23   Tanda Bleacher, MD  glucose blood test strip Use to check blood sugar 2 times a day 06/04/23   Tanda Bleacher, MD  HYDROcodone -acetaminophen  (NORCO/VICODIN) 5-325 MG tablet Take 1 tablet by mouth  every 6 (six) hours as needed for moderate pain (pain score 4-6). 09/22/23   Gretta Bertrum ORN, PA-C  HYDROmorphone  (DILAUDID ) 2 MG tablet Take 2 mg by mouth as needed for severe pain.    [provider]  hydrOXYzine  (ATARAX ) 25 MG tablet TAKE 1 TABLET BY MOUTH THREE TIMES DAILY AS NEEDED FOR  ITCHING 03/24/24   Tanda Bleacher, MD  ibuprofen  (ADVIL ) 800 MG tablet Take 800 mg by mouth 3 (three) times daily. 07/15/24   [provider]  insulin  aspart (NOVOLOG  FLEXPEN) 100 UNIT/ML FlexPen Inject 6-10 Units into the skin 3 (three) times daily with meals. 11/08/24   Trixie File, MD  insulin  glargine (LANTUS  SOLOSTAR) 100 UNIT/ML Solostar Pen Inject 26 Units into the skin daily. 11/08/24   Trixie File, MD  Insulin  Pen Needle (PEN NEEDLES) 32G X 4 MM MISC Use four times a day with insulin . 05/01/24  Newlin, Enobong, MD  Lancet Devices (EASY TOUCH LANCING DEVICE) MISC Use to check diabetes 4 times daily  E 11.65 05/25/23   Tanda Bleacher, MD  losartan  (COZAAR ) 25 MG tablet Take 1 tablet (25 mg total) by mouth every evening. 05/01/24   Newlin, Enobong, MD  metFORMIN  (GLUCOPHAGE -XR) 500 MG 24 hr tablet Take 2 tablets (1,000 mg total) by mouth daily with supper. 05/01/24   Newlin, Enobong, MD  metoCLOPramide  (REGLAN ) 5 MG tablet Take 1 tablet (5 mg total) by mouth 3 (three) times daily before meals. 12/29/24   Armbruster, Elspeth SQUIBB, MD  metoprolol  succinate (TOPROL -XL) 50 MG 24 hr tablet TAKE 1 TABLET BY MOUTH ONCE DAILY. TAKE  WITH  OR  IMMEDIATELY  FOLLOWING  A  MEAL 12/31/23   Jaycee Greig PARAS, NP  naloxone  (NARCAN ) nasal spray 4 mg/0.1 mL Place 1 spray into the nose as needed (or). 12/09/20   [provider]  naproxen  (NAPROSYN ) 500 MG tablet Take 1 tablet (500 mg total) by mouth 2 (two) times daily with a meal. 12/29/24   Tanda Bleacher, MD  ondansetron  (ZOFRAN -ODT) 8 MG disintegrating tablet Take 1 tablet (8 mg total) by mouth every 8 (eight) hours as needed for nausea or vomiting.  05/31/23   Steinl, Kevin, MD  pantoprazole  (PROTONIX ) 40 MG tablet Take 1 tablet (40 mg total) by mouth 2 (two) times daily. 12/28/24   Tanda Bleacher, MD  Semaglutide  (RYBELSUS ) 7 MG TABS Take 1 tablet (7 mg total) by mouth daily. 12/29/24   Tanda Bleacher, MD  tiZANidine  (ZANAFLEX ) 4 MG tablet TAKE 1 TABLET BY MOUTH EVERY 6 HOURS AS NEEDED FOR MUSCLE SPASM 03/24/24   Tanda Bleacher, MD  traMADol  (ULTRAM ) 50 MG tablet Take 50 mg by mouth every 6 (six) hours as needed. 08/15/24   [provider]  triamcinolone  cream (KENALOG ) 0.1 % Apply 1 Application topically daily.    [provider]    Physical Exam: Vitals:   12/30/24 2345 12/31/24 0100 12/31/24 0143 12/31/24 0221  BP:  (!) 175/92 (!) 148/76   Pulse:  (!) 114 (!) 115   Resp: 18  18   Temp:    (!) 97.5 F (36.4 C)  TempSrc:    Oral  SpO2:  96% 100%   Weight:      Height:        Physical Exam Vitals reviewed.  Constitutional:      General: She is not in acute distress. HENT:     Head: Normocephalic and atraumatic.  Cardiovascular:     Rate and Rhythm: Regular rhythm. Tachycardia present.     Pulses: Normal pulses.  Pulmonary:     Effort: Pulmonary effort is normal. No respiratory distress.     Breath sounds: Normal breath sounds.  Abdominal:     General: Bowel sounds are normal. There is no distension.     Palpations: Abdomen is soft.     Tenderness: There is abdominal tenderness.     Comments: Generalized tenderness to palpation  Musculoskeletal:     Cervical back: Normal range of motion.     Right lower leg: No edema.     Left lower leg: No edema.  Skin:    General: Skin is warm and dry.  Neurological:     General: No focal deficit present.     Mental Status: She is alert and oriented to person, place, and time.     Labs on Admission: I have personally reviewed following labs and imaging studies  CBC: Recent Labs  Lab 12/30/24 0254  WBC 13.4*  NEUTROABS 11.7*  HGB 14.6  HCT 45.1  MCV 90.2   PLT 384   Basic Metabolic Panel: Recent Labs  Lab 12/30/24 2334  NA 144  K 3.4*  CL 98  CO2 24  GLUCOSE 303*  BUN 25*  CREATININE 0.86  CALCIUM  9.9   GFR: Estimated Creatinine Clearance: 68.3 mL/min (by C-G formula based on SCr of 0.86 mg/dL). Liver Function Tests: Recent Labs  Lab 12/30/24 2334  AST 21  ALT 19  ALKPHOS 125  BILITOT 0.8  PROT 8.2*  ALBUMIN 4.4   Recent Labs  Lab 12/30/24 2334  LIPASE 20   No results for input(s): AMMONIA in the last 168 hours. Coagulation Profile: No results for input(s): INR, PROTIME in the last 168 hours. Cardiac Enzymes: No results for input(s): CKTOTAL, CKMB, CKMBINDEX, TROPONINI in the last 168 hours. BNP (last 3 results) No results for input(s): PROBNP in the last 8760 hours. HbA1C: No results for input(s): HGBA1C in the last 72 hours. CBG: Recent Labs  Lab 12/30/24 2203  GLUCAP 276*   Lipid Profile: No results for input(s): CHOL, HDL, LDLCALC, TRIG, CHOLHDL, LDLDIRECT in the last 72 hours. Thyroid  Function Tests: No results for input(s): TSH, T4TOTAL, FREET4, T3FREE, THYROIDAB in the last 72 hours. Anemia Panel: No results for input(s): VITAMINB12, FOLATE, FERRITIN, TIBC, IRON, RETICCTPCT in the last 72 hours. Urine analysis:    Component Value Date/Time   COLORURINE YELLOW 12/30/2024 2358   APPEARANCEUR HAZY (A) 12/30/2024 2358   LABSPEC 1.027 12/30/2024 2358   PHURINE 5.0 12/30/2024 2358   GLUCOSEU >=500 (A) 12/30/2024 2358   HGBUR NEGATIVE 12/30/2024 2358   BILIRUBINUR NEGATIVE 12/30/2024 2358   BILIRUBINUR negative 06/27/2018 1700   KETONESUR 80 (A) 12/30/2024 2358   PROTEINUR 30 (A) 12/30/2024 2358   UROBILINOGEN 0.2 06/27/2018 1700   NITRITE NEGATIVE 12/30/2024 2358   LEUKOCYTESUR NEGATIVE 12/30/2024 2358    Radiological Exams on Admission: CT Chest W Contrast Result Date: 12/31/2024 EXAM: CT CHEST WITH CONTRAST 12/31/2024 01:21:58 AM TECHNIQUE:  CT of the chest was performed with the administration of 100 mL of iohexol  (OMNIPAQUE ) 350 MG/ML injection. Multiplanar reformatted images are provided for review. Automated exposure control, iterative reconstruction, and/or weight based adjustment of the mA/kV was utilized to reduce the radiation dose to as low as reasonably achievable. COMPARISON: CTA chest abdomen pelvis 07/04/2022, AP lateral chest from yesterday, portable chest 07/04/2022. CLINICAL HISTORY: sepsis Sepsis. FINDINGS: MEDIASTINUM: The heart is not enlarged. There are patchy 2-vessel coronary calcific plaques in the LAD and right coronary arteries. No pericardial effusion. The great vessels are unremarkable. There is mild thoracic aortic atherosclerosis and tortuosity without aneurysm, stenosis, or dissection. Central pulmonary arteries and veins are unremarkable. The central airways are clear. There is a small hiatal hernia. LYMPH NODES: No mediastinal, hilar or axillary lymphadenopathy. LUNGS AND PLEURA: There are mild paraseptal emphysematous changes in the bilateral upper lobes. Chronic paraspinal scarring in the right lower lobe. There are mild subpleural reticulations in the lower lung fields. There is no bronchiectasis, honeycombing, or bronchial plugging. There is no pulmonary consolidation, pleural effusion, pneumothorax, or visible lung nodules. There is mild elevation of the right hemidiaphragm. SOFT TISSUES/BONES: Moderate thoracic spondylosis with no acute or other significant osseous findings. No acute abnormality of the soft tissues. UPPER ABDOMEN: There are no acute findings in the upper abdomen. The liver is steatotic. A chronic 8 mm cyst is again noted in the dome  of segment 2. There is chronic nodular thickening of the left adrenal gland, unchanged. IMPRESSION: 1. No acute findings in the chest or upper abdomen. 2. Mild upper lobe paraseptal emphysema; pulmonary emphysema is an independent risk factor for lung cancer and  recommend consideration for evaluation for a low-dose CT lung cancer screening program. 3. Aortic and coronary artery atherosclerosis. 4. Hiatal hernia. Electronically signed by: Francis Quam MD 12/31/2024 02:02 AM EST RP Workstation: [id number]   CT ANGIO ABDOMEN PELVIS  W & WO CONTRAST Result Date: 12/31/2024 EXAM: CTA ABDOMEN AND PELVIS WITH CONTRAST 12/31/2024 01:21:58 AM TECHNIQUE: CTA images of the abdomen and pelvis with intravenous contrast. 100 mL of iohexol  (OMNIPAQUE ) 350 MG/ML injection. Three-dimensional MIP/volume rendered formations were performed. Automated exposure control, iterative reconstruction, and/or weight based adjustment of the mA/kV was utilized to reduce the radiation dose to as low as reasonably achievable. COMPARISON: CTA chest abdomen and pelvis 07/04/2022, CT abdomen and pelvis with IV contrast 05/31/2023. CLINICAL HISTORY: Concern for mesenteric ischemia. FINDINGS: VASCULATURE: AORTA: Mild scattered calcific plaques without aneurysm, dissection, or stenosis, or other acute finding. CELIAC TRUNK: No acute finding. No occlusion or significant stenosis. SUPERIOR MESENTERIC ARTERY: No acute finding. No occlusion or significant stenosis. RENAL ARTERIES: Both renal arteries are single and widely patent. There is trace calcific plaque at the left renal artery origin. There are no hilar branch occlusions. ILIAC ARTERIES: The iliac arteries demonstrate scattered calcific plaques without flow-limiting stenosis. Um Below this, the superficial femoral arteries demonstrate either circumferential increased calcifications since 2023 or could have been stented in the interval. Major venous structures are opacified well in the abdomen and pelvis, including the portal vein and branches, SMV and tributaries, IVC and renal veins and the iliac veins. LIVER: There is mild-to-moderate steatosis of the liver. An 8 mm cyst is again noted in the dome of segment 2 of the liver. No liver mass. GALLBLADDER  AND BILE DUCTS: Gallbladder is unremarkable. No biliary ductal dilatation. SPLEEN: The spleen is unremarkable. PANCREAS: The pancreas is unremarkable. ADRENAL GLANDS: There is chronic slight nodular thickening in the left adrenal gland. No right adrenal nodule is seen. KIDNEYS, URETERS AND BLADDER: There is no renal mass, stone, or obstruction. No hydronephrosis. No perinephric or periureteral stranding. Urinary bladder is unremarkable. GI AND BOWEL: No active GI tract hemorrhaging is seen. No pneumatosis or portal venous gas. Stomach and duodenal sweep demonstrate no acute abnormality. No small bowel obstruction or inflammation. Normal appendix. Changes of fold thickening or non-distention noted in the ascending and proximal transverse colon. Correlate clinically for underlying colitis. There is scattered sigmoid diverticulosis without evidence of diverticulitis. There is no bowel obstruction. No abnormal bowel wall thickening or distension. REPRODUCTIVE: There is a 2.1 cm subserosal fibroid in the ventral body of the uterus. No adnexal mass is seen. Other visualized reproductive organs are unremarkable. PERITONEUM AND RETROPERITONEUM: No ascites, free air, free fluid, free hemorrhage, or incarcerated hernias. LUNG BASE: In the lower chest, there is a small hiatal hernia. The cardiac size is normal. There are 2 vessel calcifications in the LAD and right coronary artery, mildly elevated right hemidiaphragm. Lung bases are clear of infiltrate. LYMPH NODES: No lymphadenopathy. BONES AND SOFT TISSUES: There are mild degenerative changes of the thoracic and lumbar spine. No acute or significant regional osseous findings. Chronic unilateral nonerosive right sacroiliitis. There are pelvic phleboliths. No acute soft tissue abnormality. IMPRESSION: 1. No CT or CTA evidence of mesenteric ischemia. Mesenteric veins and arteries, branch arteries and smv tributaries opacify  well . 2. Fold thickening or non-distention in the  ascending and proximal transverse colon, suggesting colitis. 3. Superficial femoral arteries with circumferential calcifications increased since 2023 versus interval stenting. 4. Fatty liver infiltration. Electronically signed by: Francis Quam MD 12/31/2024 01:53 AM EST RP Workstation: [id number]   DG Chest 2 View Result Date: 12/30/2024 EXAM: 2 VIEW(S) XRAY OF THE CHEST 12/30/2024 10:58:00 PM COMPARISON: None available. CLINICAL HISTORY: Cough, congestion, shortness of breath. FINDINGS: LUNGS AND PLEURA: No focal pulmonary opacity. No pleural effusion. No pneumothorax. HEART AND MEDIASTINUM: Calcified aorta. Coronary artery stents noted. BONES AND SOFT TISSUES: Thoracic degenerative changes. IMPRESSION: 1. No acute cardiopulmonary abnormality. 2. Aortic atherosclerosis. Electronically signed by: Greig Pique MD 12/30/2024 11:03 PM EST RP Workstation: [id number]    EKG: Independently reviewed.  Sinus tachycardia.  Assessment and Plan  Sepsis secondary to acute colitis Non-bloody diarrhea Colitis likely infectious in etiology.  She is persistently tachycardic and labs showing mild leukocytosis and lactic acidosis.  Not hypotensive.  Severe dehydration also likely contributing to lactic acidosis, now improving with IV fluids.  No mesenteric ischemia seen on CT.  Continue ceftriaxone , metronidazole , and IV fluid hydration.  Trend WBC count and lactate.  Follow-up blood cultures.  C. difficile PCR and GI pathogen panel ordered, enteric precautions.  Intractable nausea and vomiting Blood-tinged emesis Likely multifactorial in the setting of colitis, semaglutide  use, and heavy marijuana use.  Diabetic gastroparesis is also on the differential.  She is reporting blood-tinged vomit which could be due to Mallory-Weiss tear versus gastritis.  EGD done in September of last year showing 4 cm hiatal hernia, normal esophagus and stomach, single duodenal polyp, and normal duodenum otherwise.  Gastric biopsy was  showing chronic inactive gastritis with intestinal metaplasia.  Hemoglobin is currently stable at 14.6.  Start scheduled IV Reglan  10 mg every 8 hours, IV Protonix  40 mg twice daily, and IV Zofran  4 mg every 6 hours PRN.  She is not on anticoagulation.  Hold home aspirin  and monitor H&H.  Starvation ketosis In the setting of intractable nausea and vomiting.  Beta hydroxybutyric acid elevated and UA with ketones.  Glucose elevated on labs but no frank metabolic acidosis to suggest DKA.  Continue IV fluid hydration.  Monitor BMP and beta hydroxybutyric acid level.  VBG ordered to check pH.  Mild hypokalemia Monitor potassium and magnesium  levels, continue to replace as needed.  Insulin -dependent type 2 diabetes DKA less likely as above.  VBG ordered to check pH.  Placed on sensitive sliding scale insulin  every 4 hours.  Last hemoglobin A1c 6.5 on 10/23/2024.  Peripheral neuropathy Hyperlipidemia Hypertension: Most recent SBP in the 140s. CAD: Workup not suggestive of ACS.  Holding aspirin  at this time as above. PAD: Holding aspirin  at this time as above. Anxiety Unable to tolerate p.o. meds at this time.  DVT prophylaxis: SCDs Code Status: Full Code (discussed with the patient) Level of care: Progressive Care Unit Admission status: It is my clinical opinion that admission to INPATIENT is reasonable and necessary because of the expectation that this patient will require hospital care that crosses at least 2 midnights to treat this condition based on the medical complexity of the problems presented.  Given the aforementioned information, the predictability of an adverse outcome is felt to be significant.  Editha Ram MD [facility]  If 7PM-7AM, please contact night-coverage [email or web address]  12/31/2024, 4:37 AM       [1]  Allergies Allergen Reactions   Penicillins Rash   "

## 2024-12-31 NOTE — Progress Notes (Addendum)
 Patient seen and examined after admission overnight.  64 year old female diabetes mellitus peripheral diabetic neuropathy and prior history of either diabetic gastroparesis and or nausea related to THC Ozempic  in 2024 Previous hematemesis secondary to Mallory-Weiss tear 2023-previous EGD 07/2024 4 cm hiatal hernia NSTEMI stents in 2020 previously on DOAC Morbid obesity BMI 32 Prior knee replacement 01/2019  Presented from home 1/31 nausea vomiting abdominal flank pain and took Dulcolax-also body aches discomfort-sodium 144 potassium 3.4 BUN/creatinine 25/0.8 LFTs normal--WBC 13.4 hemoglobin 14.6 platelet 384 beta-hydroxybutyrate 2.6 Lactic acid 6.0 CXR no acute changes CT chest abdomen pelvis mild upper lobe paraseptal emphysema--CT a abdomen no mesenteric ischemia SMV tributaries opacify well official femoral artery circumferential calcifications increasing since 2023 with interval stenting?  Focal thickening nondistention in ascending and proximal transverse colon suggestive of colitis  met it for possible sepsis secondary to colitis-C. difficile PCR GI pathogen panel pending  She is sitting up at the bedside she looks okay she says that she has had several episodes of vomit but that she was able to tolerate some liquids about 2 hours ago Says that she been vomiting at home and then it became tinged with blood-nursing does not corroborate the same they say she has not had significant vomiting since being here-patient took some laxatives at home which may have caused the diarrhea She is having no chest pain currently mild abdominal discomfort She is on aspirin   I am uncertain that she is having large hematemesis-I will repeat her hemoglobin at around 1300-because Reglan  can cause diarrhea I will hold the same for now and see how she does on just Protonix  IV 40 twice daily-I have alerted nursing staff/paramedic to let me know if she has further vomiting of blood We will place on a clear liquid  diet and see how things go ==she may have just had diarrhea from underlying illness-her lactic acid is up but is trending downward-another differential could be mesenteric ischemia but the CTA ruled that out  No charge  Reggie Grimes, MD [facility] 9:49 AM

## 2025-01-01 ENCOUNTER — Other Ambulatory Visit: Payer: Self-pay | Admitting: Cardiology

## 2025-01-01 ENCOUNTER — Ambulatory Visit: Payer: Self-pay | Admitting: Family Medicine

## 2025-01-01 DIAGNOSIS — K529 Noninfective gastroenteritis and colitis, unspecified: Secondary | ICD-10-CM

## 2025-01-01 DIAGNOSIS — I10 Essential (primary) hypertension: Secondary | ICD-10-CM

## 2025-01-01 LAB — CBC WITH DIFFERENTIAL/PLATELET
Abs Immature Granulocytes: 0.02 10*3/uL (ref 0.00–0.07)
Basophils Absolute: 0 10*3/uL (ref 0.0–0.1)
Basophils Relative: 0 %
Eosinophils Absolute: 0.1 10*3/uL (ref 0.0–0.5)
Eosinophils Relative: 1 %
HCT: 39.3 % (ref 36.0–46.0)
Hemoglobin: 12.8 g/dL (ref 12.0–15.0)
Immature Granulocytes: 0 %
Lymphocytes Relative: 29 %
Lymphs Abs: 2.6 10*3/uL (ref 0.7–4.0)
MCH: 29.6 pg (ref 26.0–34.0)
MCHC: 32.6 g/dL (ref 30.0–36.0)
MCV: 91 fL (ref 80.0–100.0)
Monocytes Absolute: 0.7 10*3/uL (ref 0.1–1.0)
Monocytes Relative: 7 %
Neutro Abs: 5.5 10*3/uL (ref 1.7–7.7)
Neutrophils Relative %: 63 %
Platelets: 277 10*3/uL (ref 150–400)
RBC: 4.32 MIL/uL (ref 3.87–5.11)
RDW: 13.6 % (ref 11.5–15.5)
WBC: 9 10*3/uL (ref 4.0–10.5)
nRBC: 0 % (ref 0.0–0.2)

## 2025-01-01 LAB — BASIC METABOLIC PANEL WITH GFR
Anion gap: 11 (ref 5–15)
BUN: 17 mg/dL (ref 8–23)
CO2: 27 mmol/L (ref 22–32)
Calcium: 8.9 mg/dL (ref 8.9–10.3)
Chloride: 103 mmol/L (ref 98–111)
Creatinine, Ser: 0.62 mg/dL (ref 0.44–1.00)
GFR, Estimated: >60 mL/min
Glucose, Bld: 138 mg/dL — ABNORMAL HIGH (ref 70–99)
Potassium: 3.4 mmol/L — ABNORMAL LOW (ref 3.5–5.1)
Sodium: 141 mmol/L (ref 135–145)

## 2025-01-01 LAB — GLUCOSE, CAPILLARY
Glucose-Capillary: 112 mg/dL — ABNORMAL HIGH (ref 70–99)
Glucose-Capillary: 135 mg/dL — ABNORMAL HIGH (ref 70–99)
Glucose-Capillary: 145 mg/dL — ABNORMAL HIGH (ref 70–99)
Glucose-Capillary: 184 mg/dL — ABNORMAL HIGH (ref 70–99)
Glucose-Capillary: 190 mg/dL — ABNORMAL HIGH (ref 70–99)
Glucose-Capillary: 222 mg/dL — ABNORMAL HIGH (ref 70–99)

## 2025-01-01 LAB — MISC LABCORP TEST (SEND OUT)
LabCorp test name: 83935
Labcorp test code: 83935

## 2025-01-01 MED ORDER — LOSARTAN POTASSIUM 25 MG PO TABS
25.0000 mg | ORAL_TABLET | Freq: Every evening | ORAL | Status: DC
  Administered 2025-01-01: 25 mg via ORAL
  Filled 2025-01-01: qty 1

## 2025-01-01 MED ORDER — HYDROCODONE-ACETAMINOPHEN 5-325 MG PO TABS
1.0000 | ORAL_TABLET | Freq: Four times a day (QID) | ORAL | Status: DC | PRN
  Administered 2025-01-01 – 2025-01-02 (×2): 1 via ORAL
  Filled 2025-01-01 (×2): qty 1

## 2025-01-01 MED ORDER — METOCLOPRAMIDE HCL 10 MG PO TABS
5.0000 mg | ORAL_TABLET | Freq: Three times a day (TID) | ORAL | Status: DC
  Administered 2025-01-01 – 2025-01-02 (×4): 5 mg via ORAL
  Filled 2025-01-01 (×4): qty 1

## 2025-01-01 MED ORDER — METOCLOPRAMIDE HCL 10 MG PO TABS: 5.0000 mg | ORAL_TABLET | Freq: Three times a day (TID) | ORAL | Status: DC | PRN

## 2025-01-01 MED ORDER — TIZANIDINE HCL 4 MG PO TABS: 4.0000 mg | ORAL_TABLET | Freq: Four times a day (QID) | ORAL | Status: DC | PRN

## 2025-01-01 MED ORDER — CYCLOBENZAPRINE HCL 10 MG PO TABS
10.0000 mg | ORAL_TABLET | Freq: Every day | ORAL | Status: DC
  Administered 2025-01-01 – 2025-01-02 (×2): 10 mg via ORAL
  Filled 2025-01-01 (×2): qty 1

## 2025-01-01 MED ORDER — HYDROXYZINE HCL 25 MG PO TABS: 25.0000 mg | ORAL_TABLET | Freq: Three times a day (TID) | ORAL | Status: DC | PRN

## 2025-01-01 MED ORDER — ONDANSETRON 4 MG PO TBDP: 8.0000 mg | ORAL_TABLET | Freq: Three times a day (TID) | ORAL | Status: DC | PRN

## 2025-01-01 MED ORDER — TRAMADOL HCL 50 MG PO TABS: 50.0000 mg | ORAL_TABLET | Freq: Four times a day (QID) | ORAL | Status: DC | PRN

## 2025-01-01 MED ORDER — GABAPENTIN 300 MG PO CAPS
600.0000 mg | ORAL_CAPSULE | Freq: Two times a day (BID) | ORAL | Status: DC
  Administered 2025-01-01 – 2025-01-02 (×3): 600 mg via ORAL
  Filled 2025-01-01 (×3): qty 2

## 2025-01-01 MED ORDER — METOPROLOL SUCCINATE ER 50 MG PO TB24
50.0000 mg | ORAL_TABLET | Freq: Every day | ORAL | Status: DC
  Administered 2025-01-01 – 2025-01-02 (×2): 50 mg via ORAL
  Filled 2025-01-01 (×2): qty 1

## 2025-01-01 MED ORDER — DICLOFENAC SODIUM 1 % EX GEL: 4.0000 g | Freq: Four times a day (QID) | CUTANEOUS | Status: DC

## 2025-01-01 NOTE — Progress Notes (Signed)
 PHARMACY - PHYSICIAN COMMUNICATION CRITICAL VALUE ALERT - BLOOD CULTURE IDENTIFICATION (BCID)  [name] is an 64 y.o. female who presented to [facility] on 12/30/2024 with a chief complaint of intractable n/v.  Assessment: 1/3 BCx bottles growing GPR (no BCID run d/t likelihood of contam)  Name of physician (or Provider) Contacted: Samtani  Current antibiotics: none  Changes to prescribed antibiotics recommended: no need for abx d/t likely contaminant Recommendations accepted by provider  No results found. However, due to the size of the patient record, not all encounters were searched. Please check Results Review for a complete set of results.  Deari Sessler A 01/01/2025  4:23 PM

## 2025-01-01 NOTE — Progress Notes (Signed)
 Mobility Specialist - Progress Note  (RA) Pre-mobility: 98% SpO2 During mobility: 96% SpO2 Post-mobility: 99% SPO2   01/01/25 1401  Mobility  Activity Ambulated with assistance  Level of Assistance Contact guard assist, steadying assist  Assistive Device Cane  Distance Ambulated (ft) 500 ft  Range of Motion/Exercises Active  Activity Response Tolerated fair  Mobility visit 1 Mobility  Mobility Specialist Start Time (ACUTE ONLY) 1350  Mobility Specialist Stop Time (ACUTE ONLY) 1401  Mobility Specialist Time Calculation (min) (ACUTE ONLY) 11 min   Pt was found in bed and agreeable to mobilize. A little unsteady at first with x3 cross steps but able to become more steady towards EOS. At EOS returned to bed with all needs met. Call bell in reach.   Erminio Leos,  Mobility Specialist Can be reached via Secure Chat

## 2025-01-02 ENCOUNTER — Other Ambulatory Visit (HOSPITAL_COMMUNITY): Payer: Self-pay

## 2025-01-02 ENCOUNTER — Other Ambulatory Visit: Payer: Self-pay

## 2025-01-02 DIAGNOSIS — K529 Noninfective gastroenteritis and colitis, unspecified: Secondary | ICD-10-CM | POA: Diagnosis not present

## 2025-01-02 LAB — GLUCOSE, CAPILLARY
Glucose-Capillary: 195 mg/dL — ABNORMAL HIGH (ref 70–99)
Glucose-Capillary: 148 mg/dL — ABNORMAL HIGH (ref 70–99)

## 2025-01-02 MED ORDER — PANTOPRAZOLE SODIUM 40 MG PO TBEC: 40.0000 mg | DELAYED_RELEASE_TABLET | Freq: Two times a day (BID) | ORAL | Status: DC

## 2025-01-02 MED ORDER — METOCLOPRAMIDE HCL 5 MG PO TABS
5.0000 mg | ORAL_TABLET | Freq: Three times a day (TID) | ORAL | 1 refills | Status: AC
  Filled 2025-01-02: qty 270, 90d supply, fill #0

## 2025-01-02 MED ORDER — ACETAMINOPHEN 325 MG PO TABS: 650.0000 mg | ORAL_TABLET | Freq: Four times a day (QID) | ORAL | Status: AC | PRN

## 2025-01-02 NOTE — TOC Transition Note (Signed)
 Transition of Care [facility]) - Discharge Note   Patient Details  Name: [name] MRN: [id number] Date of Birth: [birth year]  Transition of Care [facility]) CM/SW Contact:  Bascom Service, RN Phone Number: 01/02/2025, 11:16 AM   Clinical Narrative: d/c home No CM needs.      Final next level of care: Home/Self Care Barriers to Discharge: No Barriers Identified   Patient Goals and CMS Choice Patient states their goals for this hospitalization and ongoing recovery are:: Home CMS Medicare.gov Compare Post Acute Care list provided to:: Patient Choice offered to / list presented to : Patient      Discharge Placement                       Discharge Plan and Services Additional resources added to the After Visit Summary for                                       Social Drivers of Health (SDOH) Interventions SDOH Screenings   Food Insecurity: No Food Insecurity (12/31/2024)  Recent Concern: Food Insecurity - Food Insecurity Present (10/20/2024)  Housing: Low Risk (12/31/2024)  Transportation Needs: No Transportation Needs (12/31/2024)  Utilities: Not At Risk (12/31/2024)  Alcohol Screen: Low Risk (08/23/2023)  Depression (PHQ2-9): Low Risk (09/04/2024)  Financial Resource Strain: Medium Risk (10/20/2024)  Physical Activity: Inactive (10/20/2024)  Social Connections: Moderately Integrated (12/31/2024)  Recent Concern: Social Connections - Moderately Isolated (10/20/2024)  Stress: Stress Concern Present (10/20/2024)  Tobacco Use: Low Risk (12/30/2024)  Health Literacy: Adequate Health Literacy (08/23/2023)     Readmission Risk Interventions     No data to display

## 2025-01-03 ENCOUNTER — Telehealth: Payer: Self-pay | Admitting: *Deleted

## 2025-01-03 ENCOUNTER — Other Ambulatory Visit: Payer: Self-pay

## 2025-01-03 ENCOUNTER — Other Ambulatory Visit (HOSPITAL_COMMUNITY): Payer: Self-pay

## 2025-01-03 MED ORDER — NOVOLOG FLEXPEN 100 UNIT/ML ~~LOC~~ SOPN: 6.0000 [IU] | PEN_INJECTOR | Freq: Three times a day (TID) | SUBCUTANEOUS | 3 refills | Status: DC

## 2025-01-03 MED ORDER — NOVOLOG FLEXPEN 100 UNIT/ML ~~LOC~~ SOPN: 6.0000 [IU] | PEN_INJECTOR | Freq: Three times a day (TID) | SUBCUTANEOUS | 3 refills | Status: AC

## 2025-01-03 NOTE — Transitions of Care (Post Inpatient/ED Visit) (Signed)
" ° °  01/03/2025  Name: [name] MRN: [id number] DOB: Apr 17, 1961  Today's TOC FU Call Status: Today's TOC FU Call Status:: Unsuccessful Call (1st Attempt) Unsuccessful Call (1st Attempt) Date: 01/03/25  Attempted to reach the patient regarding the most recent Inpatient/ED visit.  Follow Up Plan: Additional outreach attempts will be made to reach the patient to complete the Transitions of Care (Post Inpatient/ED visit) call.   Andrea Dimes RN, BSN [place]  [facility] RN Care Manager [phone]  "

## 2025-01-03 NOTE — Addendum Note (Signed)
 Addended by: TRIXIE FILE on: 01/03/2025 10:29 AM   Modules accepted: Orders

## 2025-01-04 LAB — CULTURE, BLOOD (ROUTINE X 2)
Culture  Setup Time: NO GROWTH
Special Requests: ADEQUATE

## 2025-01-05 LAB — CULTURE, BLOOD (ROUTINE X 2): Culture: NO GROWTH

## 2025-02-06 ENCOUNTER — Ambulatory Visit: Admitting: Internal Medicine

## 2025-03-01 ENCOUNTER — Ambulatory Visit: Admitting: Podiatry

## 2025-03-22 ENCOUNTER — Ambulatory Visit: Admitting: Internal Medicine

## 2025-05-07 ENCOUNTER — Ambulatory Visit: Admitting: Family Medicine
